# Patient Record
Sex: Male | Born: 1960
Health system: Southern US, Community
[De-identification: ages and names within clinical notes are randomized; demographics above are authoritative.]

## PROBLEM LIST (undated history)

## (undated) DIAGNOSIS — F32A Depression, unspecified: Secondary | ICD-10-CM

## (undated) DIAGNOSIS — G4730 Sleep apnea, unspecified: Secondary | ICD-10-CM

## (undated) DIAGNOSIS — I10 Essential (primary) hypertension: Secondary | ICD-10-CM

## (undated) DIAGNOSIS — T7840XA Allergy, unspecified, initial encounter: Secondary | ICD-10-CM

## (undated) DIAGNOSIS — J439 Emphysema, unspecified: Secondary | ICD-10-CM

## (undated) DIAGNOSIS — J449 Chronic obstructive pulmonary disease, unspecified: Secondary | ICD-10-CM

## (undated) DIAGNOSIS — N189 Chronic kidney disease, unspecified: Secondary | ICD-10-CM

## (undated) DIAGNOSIS — R0902 Hypoxemia: Secondary | ICD-10-CM

## (undated) DIAGNOSIS — I509 Heart failure, unspecified: Secondary | ICD-10-CM

## (undated) DIAGNOSIS — E785 Hyperlipidemia, unspecified: Secondary | ICD-10-CM

## (undated) DIAGNOSIS — E291 Testicular hypofunction: Secondary | ICD-10-CM

## (undated) DIAGNOSIS — L309 Dermatitis, unspecified: Secondary | ICD-10-CM

## (undated) DIAGNOSIS — Z114 Encounter for screening for human immunodeficiency virus [HIV]: Secondary | ICD-10-CM

## (undated) DIAGNOSIS — F329 Major depressive disorder, single episode, unspecified: Secondary | ICD-10-CM

## (undated) DIAGNOSIS — F1910 Other psychoactive substance abuse, uncomplicated: Secondary | ICD-10-CM

## (undated) DIAGNOSIS — N529 Male erectile dysfunction, unspecified: Secondary | ICD-10-CM

## (undated) HISTORY — DX: Allergy, unspecified, initial encounter: T78.40XA

## (undated) HISTORY — DX: Emphysema, unspecified: J43.9

## (undated) HISTORY — DX: Depression, unspecified: F32.A

## (undated) HISTORY — DX: Encounter for screening for human immunodeficiency virus (HIV): Z11.4

## (undated) HISTORY — DX: Chronic kidney disease, unspecified: N18.9

## (undated) HISTORY — DX: Hypoxemia: R09.02

## (undated) HISTORY — DX: Hyperlipidemia, unspecified: E78.5

## (undated) HISTORY — DX: Other psychoactive substance abuse, uncomplicated: F19.10

## (undated) HISTORY — DX: Major depressive disorder, single episode, unspecified: F32.9

## (undated) HISTORY — PX: NO PAST SURGERIES: SHX2092

---
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
## 2007-09-21 ENCOUNTER — Emergency Department (HOSPITAL_COMMUNITY): Admission: EM | Admit: 2007-09-21 | Discharge: 2007-09-21 | Payer: Self-pay | Admitting: Emergency Medicine

## 2009-10-22 ENCOUNTER — Emergency Department (HOSPITAL_BASED_OUTPATIENT_CLINIC_OR_DEPARTMENT_OTHER): Admission: EM | Admit: 2009-10-22 | Discharge: 2009-10-22 | Payer: Self-pay | Admitting: Emergency Medicine

## 2009-10-22 ENCOUNTER — Ambulatory Visit: Payer: Self-pay | Admitting: Diagnostic Radiology

## 2011-04-14 ENCOUNTER — Emergency Department (HOSPITAL_BASED_OUTPATIENT_CLINIC_OR_DEPARTMENT_OTHER)
Admission: EM | Admit: 2011-04-14 | Discharge: 2011-04-14 | Disposition: A | Discharge status: Home / Self Care | Payer: Self-pay | Attending: Emergency Medicine | Admitting: Emergency Medicine

## 2011-04-14 DIAGNOSIS — L259 Unspecified contact dermatitis, unspecified cause: Secondary | ICD-10-CM | POA: Insufficient documentation

## 2011-04-14 DIAGNOSIS — H5789 Other specified disorders of eye and adnexa: Secondary | ICD-10-CM | POA: Insufficient documentation

## 2011-04-14 DIAGNOSIS — H109 Unspecified conjunctivitis: Secondary | ICD-10-CM | POA: Insufficient documentation

## 2011-12-24 ENCOUNTER — Encounter (HOSPITAL_BASED_OUTPATIENT_CLINIC_OR_DEPARTMENT_OTHER): Payer: Self-pay | Admitting: *Deleted

## 2011-12-24 ENCOUNTER — Emergency Department (HOSPITAL_BASED_OUTPATIENT_CLINIC_OR_DEPARTMENT_OTHER)
Admission: EM | Admit: 2011-12-24 | Discharge: 2011-12-24 | Disposition: A | Discharge status: Home Health | Payer: Self-pay | Attending: Emergency Medicine | Admitting: Emergency Medicine

## 2011-12-24 DIAGNOSIS — L259 Unspecified contact dermatitis, unspecified cause: Secondary | ICD-10-CM | POA: Insufficient documentation

## 2011-12-24 DIAGNOSIS — L309 Dermatitis, unspecified: Secondary | ICD-10-CM

## 2011-12-24 DIAGNOSIS — F17200 Nicotine dependence, unspecified, uncomplicated: Secondary | ICD-10-CM | POA: Insufficient documentation

## 2011-12-24 HISTORY — DX: Dermatitis, unspecified: L30.9

## 2011-12-24 MED ORDER — HYDROCORTISONE 1 % EX CREA: TOPICAL_CREAM | CUTANEOUS | Status: DC

## 2011-12-24 NOTE — ED Provider Notes (Signed)
History     CSN: [id number]  Arrival date & time 12/24/11  0901   First MD Initiated Contact with Patient 12/24/11 0932      Chief Complaint  Patient presents with  . Eczema    generalized    (Consider location/radiation/quality/duration/timing/severity/associated sxs/prior treatment) The history is provided by the patient.   long-standing history of eczema.  He reports approximately 2 months of worsening eczema in his bilateral antecubital fossa as well as his right little finger.  He works at not a [employer] and he has not been compliant with wearing his personal protective equipment when using the materials at the facility.  He's been putting on 0.5% hydrocortisone cream without improvement.  His rash itches.  He denies fevers and chills.  He denies spreading redness.  His symptoms are mild to moderate in severity.  His symptoms are constant.  Nothing worsens or nothing improves his symptoms  Past Medical History  Diagnosis Date  . Eczema     History reviewed. No pertinent past surgical history.  No family history on file.  History  Substance Use Topics  . Smoking status: Current Everyday Smoker -- 1.0 packs/day for 30 years    Types: Cigarettes  . Smokeless tobacco: Not on file  . Alcohol Use: Yes     occassionally      Review of Systems  All other systems reviewed and are negative.    Allergies  Review of patient's allergies indicates no known allergies.  Home Medications   Current Outpatient Rx  Name Route Sig Dispense Refill  . ASPIRIN 325 MG PO TBEC Oral Take 325 mg by mouth daily.    [name] HYDROCORTISONE 0.5 % EX CREA Topical Apply topically 2 (two) times daily.    [name] HYDROCORTISONE 1 % EX CREA  Apply to affected areas 3 times a day 30 g 0    BP 130/94  Pulse 96  Temp(Src) 98.5 F (36.9 C) (Oral)  Resp 20  Ht 5\' 8"  (1.727 m)  Wt 200 lb (90.719 kg)  BMI 30.41 kg/m2  SpO2 97%  Physical Exam  Constitutional: He is oriented to person, place, and  time. He appears well-developed and well-nourished.  HENT:  Head: Normocephalic.  Eyes: EOM are normal.  Neck: Normal range of motion.  Pulmonary/Chest: Effort normal.  Musculoskeletal: Normal range of motion.  Neurological: He is alert and oriented to person, place, and time.  Skin:       Eczema lesions of bilateral antecubital fossa is as well as right little finger.  There is no surrounding erythema or fluctuance.  There is no secondary signs of infection  Psychiatric: He has a normal mood and affect.    ED Course  Procedures (including critical care time)  Labs Reviewed - No data to display No results found.   1. Eczema       MDM  Eczema without secondary signs of infection.  Home on steroid cream.  The patient is using 0.5% hydrocortisone cream.  I will increase this to 1% hydrocortisone cream        Lyanne Co, MD 12/24/11 [phone]

## 2011-12-24 NOTE — ED Notes (Signed)
Patient states he has chronic eczema and has had increased problems over the last two months.  Using OTC hydrocortizone cream with no relief.

## 2011-12-24 NOTE — Discharge Instructions (Signed)

## 2012-01-01 ENCOUNTER — Encounter (HOSPITAL_BASED_OUTPATIENT_CLINIC_OR_DEPARTMENT_OTHER): Payer: Self-pay

## 2012-01-01 ENCOUNTER — Emergency Department (HOSPITAL_BASED_OUTPATIENT_CLINIC_OR_DEPARTMENT_OTHER)
Admission: EM | Admit: 2012-01-01 | Discharge: 2012-01-01 | Disposition: A | Discharge status: Home / Self Care | Payer: Self-pay | Attending: Emergency Medicine | Admitting: Emergency Medicine

## 2012-01-01 ENCOUNTER — Emergency Department (INDEPENDENT_AMBULATORY_CARE_PROVIDER_SITE_OTHER): Payer: Self-pay

## 2012-01-01 DIAGNOSIS — F17200 Nicotine dependence, unspecified, uncomplicated: Secondary | ICD-10-CM | POA: Insufficient documentation

## 2012-01-01 DIAGNOSIS — R609 Edema, unspecified: Secondary | ICD-10-CM | POA: Insufficient documentation

## 2012-01-01 DIAGNOSIS — L089 Local infection of the skin and subcutaneous tissue, unspecified: Secondary | ICD-10-CM | POA: Insufficient documentation

## 2012-01-01 DIAGNOSIS — M25529 Pain in unspecified elbow: Secondary | ICD-10-CM

## 2012-01-01 DIAGNOSIS — IMO0002 Reserved for concepts with insufficient information to code with codable children: Secondary | ICD-10-CM

## 2012-01-01 MED ORDER — DOXYCYCLINE HYCLATE 100 MG PO CAPS: 100.0000 mg | ORAL_CAPSULE | Freq: Two times a day (BID) | ORAL | Status: AC

## 2012-01-01 MED ORDER — CEFTRIAXONE SODIUM 1 G IJ SOLR
1.0000 g | Freq: Once | INTRAMUSCULAR | Status: AC
  Administered 2012-01-01: 1 g via INTRAMUSCULAR
  Filled 2012-01-01: qty 10

## 2012-01-01 MED ORDER — HYDROCODONE-ACETAMINOPHEN 5-325 MG PO TABS: 2.0000 | ORAL_TABLET | ORAL | Status: AC | PRN

## 2012-01-01 MED ORDER — LIDOCAINE HCL (PF) 1 % IJ SOLN
INTRAMUSCULAR | Status: AC
  Administered 2012-01-01: 2.1 mL
  Filled 2012-01-01: qty 5

## 2012-01-01 MED ORDER — CEPHALEXIN 500 MG PO CAPS: 500.0000 mg | ORAL_CAPSULE | Freq: Four times a day (QID) | ORAL | Status: AC

## 2012-01-01 NOTE — ED Notes (Signed)
C/o "boil" to right elbow x 3 days

## 2012-01-01 NOTE — ED Provider Notes (Signed)
History     CSN: [id number]  Arrival date & time 01/01/12  1326   First MD Initiated Contact with Patient 01/01/12 1513      Chief Complaint  Patient presents with  . Abscess    (Consider location/radiation/quality/duration/timing/severity/associated sxs/prior treatment) Patient is a 51 y.o. male presenting with abscess. The history is provided by the patient. No language interpreter was used.  Abscess  This is a new problem. The current episode started just prior to arrival. The onset was gradual. The problem has been gradually worsening.  Pt reports he had a pimple on his elbow that he popped.  Pt thought he had an ingrown hair and was trying to remove it.  Pt complains of redness and swelling around area now.  The center has drained.  Past Medical History  Diagnosis Date  . Eczema     History reviewed. No pertinent past surgical history.  No family history on file.  History  Substance Use Topics  . Smoking status: Current Everyday Smoker -- 1.0 packs/day for 30 years    Types: Cigarettes  . Smokeless tobacco: Not on file  . Alcohol Use: Yes     occassionally      Review of Systems  Skin: Positive for wound.  All other systems reviewed and are negative.    Allergies  Review of patient's allergies indicates no known allergies.  Home Medications   Current Outpatient Rx  Name Route Sig Dispense Refill  . ASPIRIN 325 MG PO TBEC Oral Take 325 mg by mouth daily.    [name] HYDROCORTISONE 0.5 % EX CREA Topical Apply topically 2 (two) times daily.    [name] HYDROCORTISONE 1 % EX CREA  Apply to affected areas 3 times a day 30 g 0    BP 156/86  Pulse 89  Temp(Src) 98.7 F (37.1 C) (Oral)  Resp 20  Ht 5\' 7"  (1.702 m)  Wt 200 lb (90.719 kg)  BMI 31.32 kg/m2  SpO2 99%  Physical Exam  Nursing note and vitals reviewed. Constitutional: He is oriented to person, place, and time. He appears well-developed and well-nourished.  HENT:  Head: Normocephalic and atraumatic.    Musculoskeletal: He exhibits edema and tenderness.  Neurological: He is alert and oriented to person, place, and time.  Skin: Skin is dry. There is erythema.       Swollen red right elbow,  Warm to touch  Psychiatric: He has a normal mood and affect.    ED Course  Procedures (including critical care time)  Labs Reviewed - No data to display Dg Elbow Complete Right  01/01/2012  *RADIOLOGY REPORT*  Clinical Data: Pain, swelling and redness.  RIGHT ELBOW - COMPLETE 3+ VIEW  Comparison: None.  Findings: Soft tissue swelling is seen predominantly along the dorsal and medial aspect of the elbow.   No underlying acute osseous abnormality.  IMPRESSION: Cellulitis without acute osseous abnormality.  Original Report Authenticated By: Reyes Ivan, M.D.     No diagnosis found.    MDM  Pt given Rocephin IM.  Pt given rx for doxy and keflex and hydrocodone        Langston Masker, [place] 01/01/12 1600

## 2012-01-01 NOTE — Discharge Instructions (Signed)
Skin Infections A skin infection usually develops as a result of disruption of the skin barrier.  CAUSES  A skin infection might occur following:  Trauma or an injury to the skin such as a cut or insect sting.   Inflammation (as in eczema).   Breaks in the skin between the toes (as in athlete's foot).   Swelling (edema).  SYMPTOMS  The legs are the most common site affected. Usually there is:  Redness.   Swelling.   Pain.   There may be red streaks in the area of the infection.  TREATMENT   Minor skin infections may be treated with topical antibiotics, but if the skin infection is severe, hospital care and intravenous (IV) antibiotic treatment may be needed.   Most often skin infections can be treated with oral antibiotic medicine as well as proper rest and elevation of the affected area until the infection improves.   If you are prescribed oral antibiotics, it is important to take them as directed and to take all the pills even if you feel better before you have finished all of the medicine.   You may apply warm compresses to the area for 20-30 minutes 4 times daily.  You might need a tetanus shot now if:  You have no idea when you had the last one.   You have never had a tetanus shot before.   Your wound had dirt in it.  If you need a tetanus shot and you decide not to get one, there is a rare chance of getting tetanus. Sickness from tetanus can be serious. If you get a tetanus shot, your arm may swell and become red and warm at the shot site. This is common and not a problem. SEEK MEDICAL CARE IF:  The pain and swelling from your infection do not improve within 2 days.  SEEK IMMEDIATE MEDICAL CARE IF:  You develop a fever, chills, or other serious problems.  Document Released: 11/28/2004 Document Revised: 07/03/2011 Document Reviewed: 10/10/2008 Exit[employer] Patient Information 2012 [employer]. 

## 2012-01-02 ENCOUNTER — Emergency Department (HOSPITAL_BASED_OUTPATIENT_CLINIC_OR_DEPARTMENT_OTHER)
Admission: EM | Admit: 2012-01-02 | Discharge: 2012-01-02 | Disposition: A | Discharge status: Home Health | Payer: Self-pay | Attending: Emergency Medicine | Admitting: Emergency Medicine

## 2012-01-02 ENCOUNTER — Encounter (HOSPITAL_BASED_OUTPATIENT_CLINIC_OR_DEPARTMENT_OTHER): Payer: Self-pay

## 2012-01-02 DIAGNOSIS — L0291 Cutaneous abscess, unspecified: Secondary | ICD-10-CM

## 2012-01-02 DIAGNOSIS — IMO0002 Reserved for concepts with insufficient information to code with codable children: Secondary | ICD-10-CM | POA: Insufficient documentation

## 2012-01-02 DIAGNOSIS — F17200 Nicotine dependence, unspecified, uncomplicated: Secondary | ICD-10-CM | POA: Insufficient documentation

## 2012-01-02 DIAGNOSIS — R509 Fever, unspecified: Secondary | ICD-10-CM | POA: Insufficient documentation

## 2012-01-02 DIAGNOSIS — R21 Rash and other nonspecific skin eruption: Secondary | ICD-10-CM | POA: Insufficient documentation

## 2012-01-02 LAB — CBC
HCT: 42.1 % (ref 39.0–52.0)
MCH: 32 pg (ref 26.0–34.0)
MCHC: 34.9 g/dL (ref 30.0–36.0)
RDW: 13.5 % (ref 11.5–15.5)

## 2012-01-02 LAB — BASIC METABOLIC PANEL
BUN: 9 mg/dL (ref 6–23)
Calcium: 8.7 mg/dL (ref 8.4–10.5)
Chloride: 98 mEq/L (ref 96–112)
Creatinine, Ser: 0.9 mg/dL (ref 0.50–1.35)
GFR calc Af Amer: >90 mL/min (ref >90)
GFR calc non Af Amer: >90 mL/min (ref >90)

## 2012-01-02 LAB — DIFFERENTIAL
Basophils Absolute: 0 10*3/uL (ref 0.0–0.1)
Basophils Relative: 0 % (ref 0–1)
Eosinophils Absolute: 0 10*3/uL (ref 0.0–0.7)
Eosinophils Relative: 0 % (ref 0–5)
Monocytes Absolute: 2 10*3/uL — ABNORMAL HIGH (ref 0.1–1.0)
Neutro Abs: 15.2 10*3/uL — ABNORMAL HIGH (ref 1.7–7.7)

## 2012-01-02 MED ORDER — SODIUM CHLORIDE 0.9 % IV SOLN
Freq: Once | INTRAVENOUS | Status: AC
  Administered 2012-01-02: 16:00:00 via INTRAVENOUS

## 2012-01-02 MED ORDER — VANCOMYCIN HCL IN DEXTROSE 1-5 GM/200ML-% IV SOLN
1000.0000 mg | Freq: Once | INTRAVENOUS | Status: AC
  Administered 2012-01-02: 1000 mg via INTRAVENOUS
  Filled 2012-01-02: qty 200

## 2012-01-02 MED ORDER — DEXTROSE 5 % IV SOLN
1.0000 g | INTRAVENOUS | Status: DC
  Administered 2012-01-02: 1 g via INTRAVENOUS
  Filled 2012-01-02: qty 10

## 2012-01-02 NOTE — ED Notes (Signed)
Pt here for recheck of abcess to R elbow.  Pt currently taking ABX and Vicodin.  Pt just seen here yesterday and feels he should be getting better.

## 2012-01-02 NOTE — ED Provider Notes (Signed)
Medical screening examination/treatment/procedure(s) were performed by non-physician practitioner and as supervising physician I was immediately available for consultation/collaboration.   Tyriek Hofman, MD 01/02/12 1035 

## 2012-01-02 NOTE — ED Provider Notes (Signed)
Medical screening examination/treatment/procedure(s) were conducted as a shared visit with non-physician practitioner(s) and myself.  I personally evaluated the patient during the encounter   Nat Christen, MD 01/02/12 2352

## 2012-01-02 NOTE — Discharge Instructions (Signed)

## 2012-01-02 NOTE — ED Provider Notes (Signed)
Medical screening examination/treatment/procedure(s) were conducted as a shared visit with non-physician practitioner(s) and myself.  I personally evaluated the patient during the encounter  Patient seen yesterday by the same PA with noted cellulitis to his right elbow.  He comes back today per her instructions for reevaluation and has significant worsening in his erythema and swelling that extends up his right upper extremity and down his right forearm.  She was able to incise and drain the area over the elbow and obtained a significant amount of pus drainage.  Patient has received vancomycin and ceftriaxone here for antibiotics to cover for MRSA. We have encouraged the patient that he should allow [place] to transfer him for admission given his worsening vital signs and worsening of his cellulitis since yesterday but at this point in time the patient has declined because he has dogs at home that he has to get home and take care of.  We've encouraged him to find someone else to take care of them but he has been unable to do so.  We will continue the patient on his current antibiotics given that he had an abscess that was drained which would be the main source of infection and he should begin to improve now that that has been addressed.  We'll discharge the patient to return if he changes his mind so that he can receive further IV antibiotics and likely admission. We have also mentioned that he can come back to receive further antibiotics in 12 hours to receive another dose of vancomycin as well.  He understands this and the risks of leaving without admission for further IV antibiotics.Nat Christen, MD 01/02/12 [phone]

## 2012-01-02 NOTE — ED Provider Notes (Signed)
History     CSN: [id number]  Arrival date & time 01/02/12  1519   First MD Initiated Contact with Patient 01/02/12 1533      Chief Complaint  Patient presents with  . Wound Infection    (Consider location/radiation/quality/duration/timing/severity/associated sxs/prior treatment) Patient is a 51 y.o. male presenting with rash. The history is provided by the patient. No language interpreter was used.  Rash  This is a new problem. The current episode started more than 2 days ago. The problem has not changed since onset.The problem is associated with nothing. The maximum temperature recorded prior to his arrival was 100 to 100.9 F. The fever has been present for less than 1 day. The rash is present on the right arm.  Pt is here for scheduled recheck.  Pt reports area is swelling more.  Pt is taking antibiotics as directed  Past Medical History  Diagnosis Date  . Eczema     History reviewed. No pertinent past surgical history.  No family history on file.  History  Substance Use Topics  . Smoking status: Current Everyday Smoker -- 1.0 packs/day for 30 years    Types: Cigarettes  . Smokeless tobacco: Not on file  . Alcohol Use: Yes     occassionally      Review of Systems  Musculoskeletal: Positive for myalgias and joint swelling.  Skin: Positive for rash.  All other systems reviewed and are negative.    Allergies  Review of patient's allergies indicates no known allergies.  Home Medications   Current Outpatient Rx  Name Route Sig Dispense Refill  . ASPIRIN 325 MG PO TBEC Oral Take 325 mg by mouth daily.    . CEPHALEXIN 500 MG PO CAPS Oral Take 1 capsule (500 mg total) by mouth 4 (four) times daily. 40 capsule 0  . DOXYCYCLINE HYCLATE 100 MG PO CAPS Oral Take 1 capsule (100 mg total) by mouth 2 (two) times daily. 20 capsule 0  . HYDROCODONE-ACETAMINOPHEN 5-325 MG PO TABS Oral Take 2 tablets by mouth every 4 (four) hours as needed for pain. 16 tablet 0  .  HYDROCORTISONE 0.5 % EX CREA Topical Apply topically 2 (two) times daily.    [name] HYDROCORTISONE 1 % EX CREA Topical Apply 1 application topically 2 (two) times daily. Apply to affected areas 3 times a day      BP 140/82  Pulse 118  Temp(Src) 100.1 F (37.8 C) (Oral)  Resp 18  Ht 5\' 7"  (1.702 m)  Wt 200 lb (90.719 kg)  BMI 31.32 kg/m2  SpO2 96%  Physical Exam  Nursing note and vitals reviewed. Constitutional: He is oriented to person, place, and time. He appears well-developed and well-nourished.  HENT:  Head: Normocephalic.  Musculoskeletal: He exhibits edema and tenderness.       Increased area of redness and swelling,  Significant.  Pt had approx 20x30 cm area yesterday,  Today pt has swelling in fingers and redness up arm.    I am able to palpate and area that feels fluctuant  Neurological: He is alert and oriented to person, place, and time. He has normal reflexes.  Skin: There is erythema.  Psychiatric: He has a normal mood and affect.    ED Course  INCISION AND DRAINAGE Date/Time: 01/02/2012 9:21 PM Performed by: Langston Masker Authorized by: Langston Masker Consent: Verbal consent obtained. Risks and benefits: risks, benefits and alternatives were discussed Consent given by: patient Patient understanding: patient states understanding of the procedure being performed Patient  identity confirmed: verbally with patient Time out: Immediately prior to procedure a "time out" was called to verify the correct patient, procedure, equipment, support staff and site/side marked as required. Type: abscess Body area: upper extremity Location details: right elbow Anesthesia: local infiltration Patient sedated: no Scalpel size: 11 Incision type: single straight Complexity: simple Drainage: purulent Drainage amount: moderate Packing material: 1/4 in iodoform gauze Patient tolerance: Patient tolerated the procedure well with no immediate complications.   (including critical care  time)  Labs Reviewed  CBC - Abnormal; Notable for the following:    WBC 20.0 (*)    All other components within normal limits  DIFFERENTIAL - Abnormal; Notable for the following:    Neutro Abs 15.2 (*)    Monocytes Absolute 2.0 (*)    All other components within normal limits  BASIC METABOLIC PANEL - Abnormal; Notable for the following:    Sodium 134 (*)    Glucose, Bld 115 (*)    All other components within normal limits  WOUND CULTURE   Dg Elbow Complete Right  01/01/2012  *RADIOLOGY REPORT*  Clinical Data: Pain, swelling and redness.  RIGHT ELBOW - COMPLETE 3+ VIEW  Comparison: None.  Findings: Soft tissue swelling is seen predominantly along the dorsal and medial aspect of the elbow.   No underlying acute osseous abnormality.  IMPRESSION: Cellulitis without acute osseous abnormality.  Original Report Authenticated By: Reyes Ivan, M.D.     No diagnosis found.    MDM  Pt given IV Rocehin and vancomycin today.  I advised pt I think he needs admission,  Dr. Golda Acre examined and advised of the same.  Pt refuses admission.  (AMA)  Pt agreed to return for 12 hour recheck.  He understands risk of sepsis and death.  I was able to drain an area of abscess.          Langston Masker, [place] 01/02/12 2122

## 2012-01-03 ENCOUNTER — Encounter (HOSPITAL_BASED_OUTPATIENT_CLINIC_OR_DEPARTMENT_OTHER): Payer: Self-pay | Admitting: *Deleted

## 2012-01-03 ENCOUNTER — Emergency Department (HOSPITAL_BASED_OUTPATIENT_CLINIC_OR_DEPARTMENT_OTHER)
Admission: EM | Admit: 2012-01-03 | Discharge: 2012-01-03 | Disposition: A | Discharge status: Home / Self Care | Payer: Self-pay | Attending: Emergency Medicine | Admitting: Emergency Medicine

## 2012-01-03 DIAGNOSIS — F17200 Nicotine dependence, unspecified, uncomplicated: Secondary | ICD-10-CM | POA: Insufficient documentation

## 2012-01-03 DIAGNOSIS — L0390 Cellulitis, unspecified: Secondary | ICD-10-CM

## 2012-01-03 DIAGNOSIS — Z09 Encounter for follow-up examination after completed treatment for conditions other than malignant neoplasm: Secondary | ICD-10-CM | POA: Insufficient documentation

## 2012-01-03 MED ORDER — SODIUM CHLORIDE 0.9 % IV SOLN
Freq: Once | INTRAVENOUS | Status: AC
  Administered 2012-01-03: 14:00:00 via INTRAVENOUS

## 2012-01-03 MED ORDER — VANCOMYCIN HCL IN DEXTROSE 1-5 GM/200ML-% IV SOLN
1000.0000 mg | Freq: Once | INTRAVENOUS | Status: AC
  Administered 2012-01-03: 1000 mg via INTRAVENOUS
  Filled 2012-01-03: qty 200

## 2012-01-03 MED ORDER — DEXTROSE 5 % IV SOLN
1.0000 g | INTRAVENOUS | Status: DC
  Administered 2012-01-03: 1 g via INTRAVENOUS
  Filled 2012-01-03: qty 10

## 2012-01-03 NOTE — ED Notes (Signed)
Here for wound recheck right elbow abscess. Hand remains swollen. Dressing intact. Unable to visualize wound site at triage. Bloody drainage on bulky dressing.

## 2012-01-03 NOTE — ED Provider Notes (Signed)
Medical screening examination/treatment/procedure(s) were performed by non-physician practitioner and as supervising physician I was immediately available for consultation/collaboration.   Crystalmarie Yasin, MD 01/03/12 2340 

## 2012-01-03 NOTE — Discharge Instructions (Signed)
Cellulitis Cellulitis is an infection of the tissue under the skin. The infected area is usually red and tender. This is caused by germs. These germs enter the body through cuts or sores. This usually happens in the arms or lower legs. HOME CARE   Take your medicine as told. Finish it even if you start to feel better.   If the infection is on the arm or leg, keep it raised (elevated).   Use a warm cloth on the infected area several times a day.   See your doctor for a follow-up visit as told.  GET HELP RIGHT AWAY IF:   You are tired or confused.   You throw up (vomit).   You have watery poop (diarrhea).   You feel ill and have muscle aches.   You have a fever.  MAKE SURE YOU:   Understand these instructions.   Will watch your condition.   Will get help right away if you are not doing well or get worse.  Document Released: 04/08/2008 Document Revised: 07/03/2011 Document Reviewed: 09/22/2009 [employer] Patient Information 2012 [employer]. 

## 2012-01-03 NOTE — ED Provider Notes (Signed)
History     CSN: [id number]  Arrival date & time 01/03/12  1354   First MD Initiated Contact with Patient 01/03/12 1407      Chief Complaint  Patient presents with  . Wound Check    (Consider location/radiation/quality/duration/timing/severity/associated sxs/prior treatment) Patient is a 51 y.o. male presenting with wound check. The history is provided by the patient. No language interpreter was used.  Wound Check  He was treated in the ED 2 to 3 days ago. Previous treatment in the ED includes I&D of abscess. Treatments since wound repair include oral antibiotics and a wound recheck. The fever has been present for less than 1 day. The maximum temperature noted was 100.4 to 100.9 F. There has been colored discharge from the wound. There is no redness present. The swelling has worsened. He has no difficulty moving the affected extremity or digit.  Pt here for recheck.  I did Iand D yesterday.  Pt reports he is feeling better.  Past Medical History  Diagnosis Date  . Eczema     History reviewed. No pertinent past surgical history.  No family history on file.  History  Substance Use Topics  . Smoking status: Current Everyday Smoker -- 1.0 packs/day for 30 years    Types: Cigarettes  . Smokeless tobacco: Not on file  . Alcohol Use: Yes     occassionally      Review of Systems  Skin: Positive for wound.  All other systems reviewed and are negative.    Allergies  Review of patient's allergies indicates no known allergies.  Home Medications   Current Outpatient Rx  Name Route Sig Dispense Refill  . ASPIRIN 325 MG PO TBEC Oral Take 325 mg by mouth daily.    . CEPHALEXIN 500 MG PO CAPS Oral Take 1 capsule (500 mg total) by mouth 4 (four) times daily. 40 capsule 0  . DOXYCYCLINE HYCLATE 100 MG PO CAPS Oral Take 1 capsule (100 mg total) by mouth 2 (two) times daily. 20 capsule 0  . HYDROCODONE-ACETAMINOPHEN 5-325 MG PO TABS Oral Take 2 tablets by mouth every 4 (four) hours  as needed for pain. 16 tablet 0  . HYDROCORTISONE 0.5 % EX CREA Topical Apply topically 2 (two) times daily.    [name] HYDROCORTISONE 1 % EX CREA Topical Apply 1 application topically 2 (two) times daily. Apply to affected areas 3 times a day      BP 135/76  Pulse 102  Temp(Src) 98.7 F (37.1 C) (Oral)  Resp 20  SpO2 99%  Physical Exam  Vitals reviewed. Constitutional: He appears well-developed and well-nourished.  HENT:  Head: Normocephalic and atraumatic.  Right Ear: External ear normal.  Eyes: Conjunctivae and EOM are normal. Pupils are equal, round, and reactive to light.  Musculoskeletal: He exhibits tenderness.  Neurological: He is alert.  Skin: There is erythema.    ED Course  Procedures (including critical care time)  Labs Reviewed - No data to display No results found.   No diagnosis found.    MDM  Pt has decreased redness,  Decreased swelling,  Pt looks much better than yesterday.       Langston Masker, [place] 01/03/12 1747

## 2012-01-05 ENCOUNTER — Encounter (HOSPITAL_BASED_OUTPATIENT_CLINIC_OR_DEPARTMENT_OTHER): Payer: Self-pay | Admitting: Emergency Medicine

## 2012-01-05 ENCOUNTER — Emergency Department (HOSPITAL_BASED_OUTPATIENT_CLINIC_OR_DEPARTMENT_OTHER)
Admission: EM | Admit: 2012-01-05 | Discharge: 2012-01-05 | Disposition: A | Discharge status: Home / Self Care | Payer: Self-pay | Attending: Emergency Medicine | Admitting: Emergency Medicine

## 2012-01-05 DIAGNOSIS — IMO0002 Reserved for concepts with insufficient information to code with codable children: Secondary | ICD-10-CM | POA: Insufficient documentation

## 2012-01-05 DIAGNOSIS — Z09 Encounter for follow-up examination after completed treatment for conditions other than malignant neoplasm: Secondary | ICD-10-CM | POA: Insufficient documentation

## 2012-01-05 DIAGNOSIS — Z7982 Long term (current) use of aspirin: Secondary | ICD-10-CM | POA: Insufficient documentation

## 2012-01-05 DIAGNOSIS — L02413 Cutaneous abscess of right upper limb: Secondary | ICD-10-CM

## 2012-01-05 DIAGNOSIS — Z79899 Other long term (current) drug therapy: Secondary | ICD-10-CM | POA: Insufficient documentation

## 2012-01-05 LAB — WOUND CULTURE

## 2012-01-05 NOTE — ED Notes (Signed)
Pt hear for re-check of I & D to RT elbow

## 2012-01-05 NOTE — ED Provider Notes (Signed)
History     CSN: [id number]  Arrival date & time 01/05/12  1046   First MD Initiated Contact with Patient 01/05/12 1051      Chief Complaint  Patient presents with  . Wound Check    (Consider location/radiation/quality/duration/timing/severity/associated sxs/prior treatment) Patient is a 51 y.o. male presenting with wound check. The history is provided by the patient.  Wound Check    patient is in for recheck of his right elbow abscess. He was called and informed of the culture showed MRSA.. It is sensitive to doxycycline. He had indeed a few days ago. He initially refused admission. He states it is continued to get better. There is decreased pain. The swelling is improved on his forearm and upper arm. No fevers  Past Medical History  Diagnosis Date  . Eczema     History reviewed. No pertinent past surgical history.  No family history on file.  History  Substance Use Topics  . Smoking status: Current Everyday Smoker -- 1.0 packs/day for 30 years    Types: Cigarettes  . Smokeless tobacco: Not on file  . Alcohol Use: Yes     occassionally      Review of Systems  Constitutional: Negative for fever.  Musculoskeletal: Negative for myalgias.  Skin: Positive for color change and wound. Negative for rash.  Neurological: Negative for weakness and numbness.    Allergies  Review of patient's allergies indicates no known allergies.  Home Medications   Current Outpatient Rx  Name Route Sig Dispense Refill  . ASPIRIN 325 MG PO TBEC Oral Take 325 mg by mouth daily.    . CEPHALEXIN 500 MG PO CAPS Oral Take 1 capsule (500 mg total) by mouth 4 (four) times daily. 40 capsule 0  . DOXYCYCLINE HYCLATE 100 MG PO CAPS Oral Take 1 capsule (100 mg total) by mouth 2 (two) times daily. 20 capsule 0  . HYDROCODONE-ACETAMINOPHEN 5-325 MG PO TABS Oral Take 2 tablets by mouth every 4 (four) hours as needed for pain. 16 tablet 0  . HYDROCORTISONE 0.5 % EX CREA Topical Apply topically 2 (two)  times daily.    [name] HYDROCORTISONE 1 % EX CREA Topical Apply 1 application topically 2 (two) times daily. Apply to affected areas 3 times a day      Pulse 81  Temp(Src) 98.2 F (36.8 C) (Oral)  Resp 16  SpO2 98%  Physical Exam  Nursing note and vitals reviewed. Constitutional: He appears well-developed and well-nourished.  Musculoskeletal: Normal range of motion.       Healing abscess to right elbow. Packing pulled out slightly. Still some mild peripheral and drainage. Range of motion is intact. There is edema and erythema on the forearm. There is edema of his hand. Strong distal pulse intact  Neurological: He is alert.    ED Course  Procedures (including critical care time)  Labs Reviewed - No data to display No results found.   1. Abscess of right forearm       MDM  Healing abscess of right upper extremity. Patient states it is improving. Less pain. Less edema. He'll continue the antibiotics. He'll followup in a couple days if needed. He'll have the packing removed in 2 more days        Juliet Rude. Rubin Payor, MD 01/05/12 1113

## 2012-01-05 NOTE — ED Notes (Signed)
Lab called positive abscess culture, + MRSA.  Treated with RX Doxycycline 2/27, sensitive to same per protocol MD. Will contact patient with positive result.

## 2012-02-16 ENCOUNTER — Emergency Department (HOSPITAL_BASED_OUTPATIENT_CLINIC_OR_DEPARTMENT_OTHER)
Admission: EM | Admit: 2012-02-16 | Discharge: 2012-02-16 | Disposition: A | Discharge status: Home / Self Care | Payer: Self-pay | Attending: Emergency Medicine | Admitting: Emergency Medicine

## 2012-02-16 ENCOUNTER — Encounter (HOSPITAL_BASED_OUTPATIENT_CLINIC_OR_DEPARTMENT_OTHER): Payer: Self-pay | Admitting: Emergency Medicine

## 2012-02-16 DIAGNOSIS — L309 Dermatitis, unspecified: Secondary | ICD-10-CM

## 2012-02-16 DIAGNOSIS — L259 Unspecified contact dermatitis, unspecified cause: Secondary | ICD-10-CM | POA: Insufficient documentation

## 2012-02-16 DIAGNOSIS — F17200 Nicotine dependence, unspecified, uncomplicated: Secondary | ICD-10-CM | POA: Insufficient documentation

## 2012-02-16 DIAGNOSIS — B9562 Methicillin resistant Staphylococcus aureus infection as the cause of diseases classified elsewhere: Secondary | ICD-10-CM

## 2012-02-16 MED ORDER — SULFAMETHOXAZOLE-TRIMETHOPRIM 800-160 MG PO TABS: 1.0000 | ORAL_TABLET | Freq: Two times a day (BID) | ORAL | Status: AC

## 2012-02-16 NOTE — ED Notes (Signed)
Pt having problems with his eczema.  Dryness to hands.  Pt having irritation to right eye, states he was told that he has eczema in his eye in the past.

## 2012-02-16 NOTE — Discharge Instructions (Signed)
Cellulitis Cellulitis is an infection of the skin and the tissue beneath it. The area is typically red and tender. It is caused by germs (bacteria) (usually staph or strep) that enter the body through cuts or sores. Cellulitis most commonly occurs in the arms or lower legs.  HOME CARE INSTRUCTIONS   If you are given a prescription for medications which kill germs (antibiotics), take as directed until finished.   If the infection is on the arm or leg, keep the limb elevated as able.   Use a warm cloth several times per day to relieve pain and encourage healing.   See your caregiver for recheck of the infected site as directed if problems arise.   Only take over-the-counter or prescription medicines for pain, discomfort, or fever as directed by your caregiver.  SEEK MEDICAL CARE IF:   The area of redness (inflammation) is spreading, there are red streaks coming from the infected site, or if a part of the infection begins to turn dark in color.   The joint or bone underneath the infected skin becomes painful after the skin has healed.   The infection returns in the same or another area after it seems to have gone away.   A boil or bump swells up. This may be an abscess.   New, unexplained problems such as pain or fever develop.  SEEK IMMEDIATE MEDICAL CARE IF:   You have a fever.   You or your child feels drowsy or lethargic.   There is vomiting, diarrhea, or lasting discomfort or feeling ill (malaise) with muscle aches and pains.  MAKE SURE YOU:   Understand these instructions.   Will watch your condition.   Will get help right away if you are not doing well or get worse.  Document Released: 07/31/2005 Document Revised: 10/10/2011 Document Reviewed: 06/08/2008 [employer] PatiEczema Atopic dermatitis, or eczema, is an inherited type of sensitive skin. Often people with eczema have a family history of allergies, asthma, or hay fever. It causes a red itchy rash and dry scaly skin.  The itchiness may occur before the skin rash and may be very intense. It is not contagious. Eczema is generally worse during the cooler winter months and often improves with the warmth of summer. Eczema usually starts showing signs in infancy. Some children outgrow eczema, but it may last through adulthood. Flare-ups may be caused by:  Eating something or contact with something you are sensitive or allergic to.   Stress.  DIAGNOSIS  The diagnosis of eczema is usually based upon symptoms and medical history. TREATMENT  Eczema cannot be cured, but symptoms usually can be controlled with treatment or avoidance of allergens (things to which you are sensitive or allergic to).  Controlling the itching and scratching.   Use over-the-counter antihistamines as directed for itching. It is especially useful at night when the itching tends to be worse.   Use over-the-counter steroid creams as directed for itching.   Scratching makes the rash and itching worse and may cause impetigo (a skin infection) if fingernails are contaminated (dirty).   Keeping the skin well moisturized with creams every day. This will seal in moisture and help prevent dryness. Lotions containing alcohol and water can dry the skin and are not recommended.   Limiting exposure to allergens.   Recognizing situations that cause stress.   Developing a plan to manage stress.  HOME CARE INSTRUCTIONS   Take prescription and over-the-counter medicines as directed by your caregiver.   Do not use  anything on the skin without checking with your caregiver.   Keep baths or showers short (5 minutes) in warm (not hot) water. Use mild cleansers for bathing. You may add non-perfumed bath oil to the bath water. It is best to avoid soap and bubble bath.   Immediately after a bath or shower, when the skin is still damp, apply a moisturizing ointment to the entire body. This ointment should be a petroleum ointment. This will seal in moisture  and help prevent dryness. The thicker the ointment the better. These should be unscented.   Keep fingernails cut short and wash hands often. If your child has eczema, it may be necessary to put soft gloves or mittens on your child at night.   Dress in clothes made of cotton or cotton blends. Dress lightly, as heat increases itching.   Avoid foods that may cause flare-ups. Common foods include cow's milk, peanut butter, eggs and wheat.   Keep a child with eczema away from anyone with fever blisters. The virus that causes fever blisters (herpes simplex) can cause a serious skin infection in children with eczema.  SEEK MEDICAL CARE IF:   Itching interferes with sleep.   The rash gets worse or is not better within one week following treatment.   The rash looks infected (pus or soft yellow scabs).   You or your child has an oral temperature above 102 F (38.9 C).   Your baby is older than 3 months with a rectal temperature of 100.5 F (38.1 C) or higher for more than 1 day.   The rash flares up after contact with someone who has fever blisters.  SEEK IMMEDIATE MEDICAL CARE IF:   Your baby is older than 3 months with a rectal temperature of 102 F (38.9 C) or higher.   Your baby is older than 3 months or younger with a rectal temperature of 100.4 F (38 C) or higher.  Document Released: 10/18/2000 Document Revised: 10/10/2011 Document Reviewed: 08/23/2009 [facility] Patient Information 2012 [employer].ent Information 2012 [place], [place].

## 2012-02-16 NOTE — ED Provider Notes (Signed)
History     CSN: [id number]  Arrival date & time 02/16/12  1137   First MD Initiated Contact with Patient 02/16/12 1217      Chief Complaint  Patient presents with  . Eczema  . Eye Problem    (Consider location/radiation/quality/duration/timing/severity/associated sxs/prior treatment) HPI Patient with history of eczema. He states he has been using Aquaphor with gloves with some relief. He uses impermeable gloves which causes hands to swell and makes the symptoms worse. He continues to have some rash on his hands on the insides of both of his elbows. He states he has also had an area under his right elbow that was MRSA. This began as a reddened area and he received antibiotics but had to come back and have it I&D. He states it come back for 3 days in a row for IV antibody but got better from this. Now with approximately a month ago. He now notes a Maul area in the left lower abdomen that is erythematous and about 2 x 2 centimeters. He states that this looks like how the elbow first began. He is concerned that it is MRSA. He has not had any fever chills or streaking. It has not had any pus or fluctuance to the area. He has not been will see her primary care doctor due to financial reasons. Past Medical History  Diagnosis Date  . Eczema     History reviewed. No pertinent past surgical history.  History reviewed. No pertinent family history.  History  Substance Use Topics  . Smoking status: Current Everyday Smoker -- 1.0 packs/day for 30 years    Types: Cigarettes  . Smokeless tobacco: Not on file  . Alcohol Use: Yes     occassionally      Review of Systems  All other systems reviewed and are negative.    Allergies  Review of patient's allergies indicates no known allergies.  Home Medications   Current Outpatient Rx  Name Route Sig Dispense Refill  . ASPIRIN 325 MG PO TBEC Oral Take 325 mg by mouth daily.    [name] HYDROCORTISONE 0.5 % EX CREA Topical Apply topically 2  (two) times daily.    [name] HYDROCORTISONE 1 % EX CREA Topical Apply 1 application topically 2 (two) times daily. Apply to affected areas 3 times a day      BP 142/93  Pulse 88  Temp(Src) 98.2 F (36.8 C) (Oral)  Resp 16  SpO2 97%  Physical Exam  Nursing note and vitals reviewed. Constitutional: He is oriented to person, place, and time. He appears well-developed and well-nourished.  HENT:  Head: Normocephalic and atraumatic.  Right Ear: External ear normal.  Left Ear: External ear normal.  Nose: Nose normal.  Mouth/Throat: Oropharynx is clear and moist.  Eyes: Conjunctivae and EOM are normal. Pupils are equal, round, and reactive to light.  Neck: Normal range of motion. Neck supple.  Cardiovascular: Normal rate, regular rhythm, normal heart sounds and intact distal pulses.   Pulmonary/Chest: Effort normal and breath sounds normal.  Abdominal: Soft. Bowel sounds are normal.  Musculoskeletal: Normal range of motion.  Neurological: He is alert and oriented to person, place, and time. He has normal reflexes.  Skin: Skin is warm and dry.       Rash consistent with eczema anterior folds of elbows bilateral hands. Erythematous lesion left lower abdomen 2 x 2 centimeters without fluctuance or induration.  Psychiatric: He has a normal mood and affect. His behavior is normal. Thought content normal.  ED Course  Procedures (including critical care time)  Labs Reviewed - No data to display No results found.   No diagnosis found.    MDM          Hilario Quarry, MD 02/16/12 1524

## 2012-08-10 ENCOUNTER — Emergency Department (HOSPITAL_BASED_OUTPATIENT_CLINIC_OR_DEPARTMENT_OTHER)
Admission: EM | Admit: 2012-08-10 | Discharge: 2012-08-10 | Disposition: A | Discharge status: Home / Self Care | Payer: Self-pay | Attending: Emergency Medicine | Admitting: Emergency Medicine

## 2012-08-10 ENCOUNTER — Encounter (HOSPITAL_BASED_OUTPATIENT_CLINIC_OR_DEPARTMENT_OTHER): Payer: Self-pay | Admitting: Family Medicine

## 2012-08-10 DIAGNOSIS — T63301A Toxic effect of unspecified spider venom, accidental (unintentional), initial encounter: Secondary | ICD-10-CM

## 2012-08-10 DIAGNOSIS — F17200 Nicotine dependence, unspecified, uncomplicated: Secondary | ICD-10-CM | POA: Insufficient documentation

## 2012-08-10 DIAGNOSIS — T6391XA Toxic effect of contact with unspecified venomous animal, accidental (unintentional), initial encounter: Secondary | ICD-10-CM | POA: Insufficient documentation

## 2012-08-10 DIAGNOSIS — T63391A Toxic effect of venom of other spider, accidental (unintentional), initial encounter: Secondary | ICD-10-CM | POA: Insufficient documentation

## 2012-08-10 DIAGNOSIS — Y92009 Unspecified place in unspecified non-institutional (private) residence as the place of occurrence of the external cause: Secondary | ICD-10-CM | POA: Insufficient documentation

## 2012-08-10 MED ORDER — HYDROCORTISONE 1 % EX CREA: TOPICAL_CREAM | CUTANEOUS | Status: DC

## 2012-08-10 NOTE — ED Notes (Signed)
Pt c/o unknown insect bite to Left upper arm x 2 days. Pt c/o itching.

## 2012-08-10 NOTE — ED Notes (Signed)
Pt reports being bitten by a spider this past Saturday. Pt reports putting etoh pad on site to reduce itching. Pt has a reddened ara approximately the size of a dime on left upper arm. Pt denies nausea, vomiting, diarrhea or pain with the bite.

## 2012-08-10 NOTE — ED Provider Notes (Addendum)
History     CSN: [id number]  Arrival date & time 08/10/12  1126   First MD Initiated Contact with Patient 08/10/12 1148      Chief Complaint  Patient presents with  . Insect Bite    (Consider location/radiation/quality/duration/timing/severity/associated sxs/prior treatment) HPI Comments: Bit by a spider 2 days ago with persistent lesion on the left upper arm that itches. 0 pain. Mild redness around the area but feels that it's improving  Patient is a 51 y.o. male presenting with animal bite. The history is provided by the patient.  Animal Bite  The incident occurred more than 2 days ago. The incident occurred at home. There is an injury to the left upper arm. The patient is experiencing no pain. There have been no prior injuries to these areas. He has received no recent medical care.    Past Medical History  Diagnosis Date  . Eczema     History reviewed. No pertinent past surgical history.  No family history on file.  History  Substance Use Topics  . Smoking status: Current Every Day Smoker -- 1.0 packs/day for 30 years    Types: Cigarettes  . Smokeless tobacco: Not on file  . Alcohol Use: Yes     occassionally      Review of Systems  All other systems reviewed and are negative.    Allergies  Review of patient's allergies indicates no known allergies.  Home Medications   Current Outpatient Rx  Name Route Sig Dispense Refill  . ASPIRIN 325 MG PO TBEC Oral Take 325 mg by mouth daily.    [name] HYDROCORTISONE 0.5 % EX CREA Topical Apply topically 2 (two) times daily.    [name] HYDROCORTISONE 1 % EX CREA Topical Apply 1 application topically 2 (two) times daily. Apply to affected areas 3 times a day      BP 162/96  Pulse 80  Temp 98.8 F (37.1 C) (Oral)  Resp 16  Ht 5\' 7"  (1.702 m)  Wt 200 lb (90.719 kg)  BMI 31.32 kg/m2  SpO2 100%  Physical Exam  Nursing note and vitals reviewed. Constitutional: He is oriented to person, place, and time. He appears  well-developed and well-nourished. No distress.  HENT:  Head: Normocephalic and atraumatic.  Eyes: EOM are normal. Pupils are equal, round, and reactive to light.  Pulmonary/Chest: Effort normal.  Neurological: He is alert and oriented to person, place, and time.  Skin: Skin is warm and dry.       ED Course  Procedures (including critical care time)  Labs Reviewed - No data to display No results found.   1. Spider bite       MDM   Patient is present with an insect bite which most likely was a spider from several days ago. No sign of secondary infection and otherwise patient is well-appearing. Recommended using hydrocortisone cream and Benadryl when necessary for itching.        Gwyneth Sprout, MD 08/10/12 4098  Gwyneth Sprout, MD 08/10/12 1210

## 2013-07-29 ENCOUNTER — Encounter (HOSPITAL_COMMUNITY): Payer: Self-pay | Admitting: Emergency Medicine

## 2013-07-29 ENCOUNTER — Emergency Department (HOSPITAL_COMMUNITY): Payer: Self-pay

## 2013-07-29 ENCOUNTER — Emergency Department (HOSPITAL_COMMUNITY)
Admission: EM | Admit: 2013-07-29 | Discharge: 2013-07-29 | Disposition: A | Discharge status: Home / Self Care | Payer: Self-pay | Attending: Emergency Medicine | Admitting: Emergency Medicine

## 2013-07-29 DIAGNOSIS — Z7982 Long term (current) use of aspirin: Secondary | ICD-10-CM | POA: Insufficient documentation

## 2013-07-29 DIAGNOSIS — R071 Chest pain on breathing: Secondary | ICD-10-CM | POA: Insufficient documentation

## 2013-07-29 DIAGNOSIS — F17200 Nicotine dependence, unspecified, uncomplicated: Secondary | ICD-10-CM | POA: Insufficient documentation

## 2013-07-29 DIAGNOSIS — J069 Acute upper respiratory infection, unspecified: Secondary | ICD-10-CM | POA: Insufficient documentation

## 2013-07-29 DIAGNOSIS — Z872 Personal history of diseases of the skin and subcutaneous tissue: Secondary | ICD-10-CM | POA: Insufficient documentation

## 2013-07-29 DIAGNOSIS — R0789 Other chest pain: Secondary | ICD-10-CM

## 2013-07-29 DIAGNOSIS — I10 Essential (primary) hypertension: Secondary | ICD-10-CM | POA: Insufficient documentation

## 2013-07-29 MED ORDER — ALBUTEROL SULFATE HFA 108 (90 BASE) MCG/ACT IN AERS
2.0000 | INHALATION_SPRAY | RESPIRATORY_TRACT | Status: DC | PRN
  Administered 2013-07-29: 2 via RESPIRATORY_TRACT
  Filled 2013-07-29: qty 6.7

## 2013-07-29 MED ORDER — OXYCODONE-ACETAMINOPHEN 5-325 MG PO TABS
1.0000 | ORAL_TABLET | Freq: Once | ORAL | Status: AC
  Administered 2013-07-29: 1 via ORAL
  Filled 2013-07-29: qty 1

## 2013-07-29 MED ORDER — OXYCODONE-ACETAMINOPHEN 5-325 MG PO TABS: 1.0000 | ORAL_TABLET | Freq: Four times a day (QID) | ORAL | Status: DC | PRN

## 2013-07-29 NOTE — ED Provider Notes (Signed)
CSN: [id number]     Arrival date & time 07/29/13  8657 History   First MD Initiated Contact with Patient 07/29/13 [phone]     Chief Complaint  Patient presents with  . Chest Pain  . Cough   (Consider location/radiation/quality/duration/timing/severity/associated sxs/prior Treatment) Patient is a 52 y.o. male presenting with chest pain and cough. The history is provided by the patient.  Chest Pain Associated symptoms: cough   Associated symptoms: no abdominal pain, no back pain, no headache, no nausea, no numbness, no shortness of breath, not vomiting and no weakness   Cough Associated symptoms: chest pain   Associated symptoms: no headaches, no rash and no shortness of breath    patient's had a cough for the last few days. He states that they ago he had a cough and right after that he developed pain in his left lower chest. It is worse with movement or coughing. No fevers. States he has been breathing of some sputum when he is unable to cough up. No lightheadedness or dizziness. He is a smoker. The pain is sharp.  Past Medical History  Diagnosis Date  . Eczema    History reviewed. No pertinent past surgical history. History reviewed. No pertinent family history. History  Substance Use Topics  . Smoking status: Current Every Day Smoker -- 1.00 packs/day for 30 years    Types: Cigarettes  . Smokeless tobacco: Not on file  . Alcohol Use: Yes     Comment: occassionally    Review of Systems  Constitutional: Negative for activity change and appetite change.  HENT: Negative for neck stiffness.   Eyes: Negative for pain.  Respiratory: Positive for cough. Negative for chest tightness and shortness of breath.   Cardiovascular: Positive for chest pain. Negative for leg swelling.  Gastrointestinal: Negative for nausea, vomiting, abdominal pain and diarrhea.  Genitourinary: Negative for flank pain.  Musculoskeletal: Negative for back pain.  Skin: Negative for rash.  Neurological: Negative  for weakness, numbness and headaches.  Psychiatric/Behavioral: Negative for behavioral problems.    Allergies  Review of patient's allergies indicates no known allergies.  Home Medications   Current Outpatient Rx  Name  Route  Sig  Dispense  Refill  . aspirin 325 MG EC tablet   Oral   Take 325 mg by mouth daily.         . Multiple Vitamin (THERA PO)   Oral   Take 1 packet by mouth at bedtime as needed (for cold).         [name] oxyCODONE-acetaminophen (PERCOCET/ROXICET) 5-325 MG per tablet   Oral   Take 1-2 tablets by mouth every 6 (six) hours as needed for pain.   15 tablet   0    BP 167/114  Pulse 87  Temp(Src) 98.1 F (36.7 C) (Oral)  Resp 18  SpO2 95% Physical Exam  Nursing note and vitals reviewed. Constitutional: He is oriented to person, place, and time. He appears well-developed and well-nourished.  HENT:  Head: Normocephalic and atraumatic.  Eyes: EOM are normal. Pupils are equal, round, and reactive to light.  Neck: Normal range of motion. Neck supple.  Cardiovascular: Normal rate, regular rhythm and normal heart sounds.   No murmur heard. Pulmonary/Chest: Effort normal. He exhibits tenderness.  Mild diffuse harsh wheezes bilaterally. Tenderness to left anterior lower chest wall. No crepitus or deformity.  Abdominal: Soft. Bowel sounds are normal. He exhibits no distension and no mass. There is no tenderness. There is no rebound and no guarding.  Musculoskeletal: Normal range of motion. He exhibits no edema.  Neurological: He is alert and oriented to person, place, and time. No cranial nerve deficit.  Skin: Skin is warm and dry.  Psychiatric: He has a normal mood and affect.    ED Course  Procedures (including critical care time) Labs Review Labs Reviewed - No data to display Imaging Review Dg Ribs Unilateral W/chest Left  07/29/2013   CLINICAL DATA:  Left lower rib pain and and shortness of Breath.  EXAM: LEFT RIBS AND CHEST - 3+ VIEW  COMPARISON:   None.  FINDINGS: The cardiac silhouette, mediastinal and hilar contours are within normal limits. The lungs are clear. No pleural effusion, pleural thickening or pneumothorax.  Dedicated views of the left ribs demonstrate no definite fracture or bone lesion.  IMPRESSION: No acute cardiopulmonary findings.  No left-sided rib fracture or bone lesion.   Electronically Signed   By: Loralie Champagne M.D.   On: 07/29/2013 10:36    Date: 07/29/2013  Rate: 85  Rhythm: normal sinus rhythm  QRS Axis: normal  Intervals: normal  ST/T Wave abnormalities: normal  Conduction Disutrbances:none  Narrative Interpretation:   Old EKG Reviewed: none available   MDM   1. URI (upper respiratory infection)   2. Chest wall pain    Patient with cough this had some production. Diffuse wheezes. Tenderness in left lower chest wall began acutely after the coughing. Likely musculoskeletal. EKG reassuring. X-ray does not show fracture. We'll treat with pain medicines and inhaler. Doubt cardiac or pulmonary embolism as a cause.  Patient does have hypertension. Could be due to the pain, however will need to be followed.    Juliet Rude. Rubin Payor, MD 07/29/13 1201

## 2013-07-29 NOTE — ED Notes (Signed)
Patient was educated not to drive, operate heavy machinery, or drink alcohol while taking narcotic medication.  

## 2013-07-29 NOTE — Progress Notes (Signed)
[facility] provided pt with a list of primary care resources and a [facility] [employer] [facility] application.  °

## 2013-07-29 NOTE — ED Notes (Signed)
Bed: ZO10 Expected date:  Expected time:  Means of arrival:  Comments: ems- right rib cage pain due to cough

## 2013-07-29 NOTE — ED Notes (Signed)
Patient transported to X-ray 

## 2013-07-29 NOTE — ED Notes (Signed)
Per EMS patient reports to ED for sever coughing beginning two days ago, two days ago patient had a particularly strong cough which led to sudden onset left lower chest pain. EMS reports that patient has thick sputum in his throat.

## 2013-09-12 ENCOUNTER — Emergency Department (HOSPITAL_COMMUNITY)
Admission: EM | Admit: 2013-09-12 | Discharge: 2013-09-12 | Disposition: A | Discharge status: Home / Self Care | Payer: Self-pay | Attending: Emergency Medicine | Admitting: Emergency Medicine

## 2013-09-12 ENCOUNTER — Emergency Department (HOSPITAL_COMMUNITY): Payer: Self-pay

## 2013-09-12 ENCOUNTER — Encounter (HOSPITAL_COMMUNITY): Payer: Self-pay | Admitting: Emergency Medicine

## 2013-09-12 DIAGNOSIS — F17200 Nicotine dependence, unspecified, uncomplicated: Secondary | ICD-10-CM | POA: Insufficient documentation

## 2013-09-12 DIAGNOSIS — R05 Cough: Secondary | ICD-10-CM

## 2013-09-12 DIAGNOSIS — Z872 Personal history of diseases of the skin and subcutaneous tissue: Secondary | ICD-10-CM | POA: Insufficient documentation

## 2013-09-12 DIAGNOSIS — Z79899 Other long term (current) drug therapy: Secondary | ICD-10-CM | POA: Insufficient documentation

## 2013-09-12 DIAGNOSIS — J209 Acute bronchitis, unspecified: Secondary | ICD-10-CM | POA: Insufficient documentation

## 2013-09-12 DIAGNOSIS — Z7982 Long term (current) use of aspirin: Secondary | ICD-10-CM | POA: Insufficient documentation

## 2013-09-12 DIAGNOSIS — J40 Bronchitis, not specified as acute or chronic: Secondary | ICD-10-CM

## 2013-09-12 DIAGNOSIS — R059 Cough, unspecified: Secondary | ICD-10-CM

## 2013-09-12 MED ORDER — IBUPROFEN 200 MG PO TABS
600.0000 mg | ORAL_TABLET | Freq: Once | ORAL | Status: AC
  Administered 2013-09-12: 600 mg via ORAL
  Filled 2013-09-12: qty 3

## 2013-09-12 MED ORDER — OXYCODONE-ACETAMINOPHEN 5-325 MG PO TABS
1.0000 | ORAL_TABLET | Freq: Once | ORAL | Status: AC
  Administered 2013-09-12: 1 via ORAL
  Filled 2013-09-12: qty 1

## 2013-09-12 MED ORDER — AZITHROMYCIN 250 MG PO TABS: 250.0000 mg | ORAL_TABLET | Freq: Every day | ORAL | Status: DC

## 2013-09-12 MED ORDER — HYDROCODONE-ACETAMINOPHEN 5-325 MG PO TABS: 1.0000 | ORAL_TABLET | ORAL | Status: DC | PRN

## 2013-09-12 NOTE — ED Provider Notes (Signed)
CSN: [id number]     Arrival date & time 09/12/13  1027 History   First MD Initiated Contact with Patient 09/12/13 1104     Chief Complaint  Patient presents with  . Cough   (Consider location/radiation/quality/duration/timing/severity/associated sxs/prior Treatment) The history is provided by the patient.  patient reports developing productive coughing congestion over the past several days.  Chills without documented fever.  He states discomfort in the left lateral chest only when coughing.  He tried ibuprofen without improvement in his symptoms.  He's tried Mucinex with some improvement in his productive cough.  No history of asthma, COPD, emphysema.  He continues to smoke cigarettes and has a 30-pack-year history.  Patient denies weight loss.  No unilateral leg swelling.  No history of DVT or pulmonary embolism.  No abdominal pain.  No back pain.  No shortness of breath or exertional shortness of breath.  His symptoms are mild to moderate in severity.  Past Medical History  Diagnosis Date  . Eczema    History reviewed. No pertinent past surgical history. No family history on file. History  Substance Use Topics  . Smoking status: Current Every Day Smoker -- 1.00 packs/day for 30 years    Types: Cigarettes  . Smokeless tobacco: Not on file  . Alcohol Use: Yes     Comment: occassionally    Review of Systems  Respiratory: Positive for cough.   All other systems reviewed and are negative.    Allergies  Review of patient's allergies indicates no known allergies.  Home Medications   Current Outpatient Rx  Name  Route  Sig  Dispense  Refill  . aspirin 325 MG EC tablet   Oral   Take 325 mg by mouth daily.         [name] guaiFENesin (MUCINEX) 600 MG 12 hr tablet   Oral   Take 1,200 mg by mouth 2 (two) times daily.         [name] ibuprofen (ADVIL,MOTRIN) 200 MG tablet   Oral   Take 400 mg by mouth every 6 (six) hours as needed.         [name] azithromycin (ZITHROMAX Z-PAK) 250 MG  tablet   Oral   Take 1 tablet (250 mg total) by mouth daily. Take 2 tabs for first dose, then 1 tab for each additional dose   6 tablet   0   . HYDROcodone-acetaminophen (NORCO/VICODIN) 5-325 MG per tablet   Oral   Take 1 tablet by mouth every 4 (four) hours as needed for moderate pain.   15 tablet   0    BP 144/78  Pulse 80  Temp(Src) 98.1 F (36.7 C) (Oral)  Resp 20  SpO2 96% Physical Exam  Nursing note and vitals reviewed. Constitutional: He is oriented to person, place, and time. He appears well-developed and well-nourished.  HENT:  Head: Normocephalic and atraumatic.  Eyes: EOM are normal.  Neck: Normal range of motion.  Cardiovascular: Normal rate, regular rhythm, normal heart sounds and intact distal pulses.   Pulmonary/Chest: Effort normal and breath sounds normal. No respiratory distress. He has no wheezes. He has no rales.  Abdominal: Soft. He exhibits no distension. There is no tenderness.  Musculoskeletal: Normal range of motion.  Neurological: He is alert and oriented to person, place, and time.  Skin: Skin is warm and dry.  Psychiatric: He has a normal mood and affect. Judgment normal.    ED Course  Procedures (including critical care time) Labs Review Labs Reviewed -  No data to display Imaging Review Dg Chest 2 View  09/12/2013   CLINICAL DATA:  Cough, congestion for several days.  EXAM: CHEST  2 VIEW  COMPARISON:  07/29/2013.  FINDINGS: The cardiomediastinal silhouette is normal. There is no focal consolidation, pleural effusion or pneumothorax. There is left basilar atelectasis. The osseous structures are unremarkable.  IMPRESSION: No acute disease of the chest.   Electronically Signed   By: Elige Ko   On: 09/12/2013 11:40  I personally reviewed the imaging tests through PACS system I reviewed available ER/hospitalization records through the EMR   EKG Interpretation   None       MDM   1. Cough   2. Bronchitis    12:53 PM Patient feels  much better at this time.  Discharge home with a diagnosis of bronchitis.  I recommended that he stop smoking cigarettes.  He understands return to the ER for new or worsening symptoms.    Lyanne Co, MD 09/12/13 1254

## 2013-09-12 NOTE — ED Notes (Signed)
Pt from home reports that he has had  productive cough with green sputum x3days. Pt adds that his L side is hurting from coughing.  Pt taking mucinex with no relief.Pt is A&O and in NAD

## 2013-09-12 NOTE — ED Notes (Signed)
Pt desat to 90% on RA after walking to room. Pt placed on 2L. RN Ajsa notified

## 2013-10-09 ENCOUNTER — Emergency Department (HOSPITAL_BASED_OUTPATIENT_CLINIC_OR_DEPARTMENT_OTHER): Payer: Self-pay

## 2013-10-09 ENCOUNTER — Encounter (HOSPITAL_BASED_OUTPATIENT_CLINIC_OR_DEPARTMENT_OTHER): Payer: Self-pay | Admitting: Emergency Medicine

## 2013-10-09 ENCOUNTER — Emergency Department (HOSPITAL_BASED_OUTPATIENT_CLINIC_OR_DEPARTMENT_OTHER)
Admission: EM | Admit: 2013-10-09 | Discharge: 2013-10-09 | Disposition: A | Discharge status: Home / Self Care | Payer: Self-pay | Attending: Emergency Medicine | Admitting: Emergency Medicine

## 2013-10-09 DIAGNOSIS — R5383 Other fatigue: Secondary | ICD-10-CM | POA: Insufficient documentation

## 2013-10-09 DIAGNOSIS — IMO0002 Reserved for concepts with insufficient information to code with codable children: Secondary | ICD-10-CM | POA: Insufficient documentation

## 2013-10-09 DIAGNOSIS — F17200 Nicotine dependence, unspecified, uncomplicated: Secondary | ICD-10-CM | POA: Insufficient documentation

## 2013-10-09 DIAGNOSIS — Z79899 Other long term (current) drug therapy: Secondary | ICD-10-CM | POA: Insufficient documentation

## 2013-10-09 DIAGNOSIS — R079 Chest pain, unspecified: Secondary | ICD-10-CM | POA: Insufficient documentation

## 2013-10-09 DIAGNOSIS — Z872 Personal history of diseases of the skin and subcutaneous tissue: Secondary | ICD-10-CM | POA: Insufficient documentation

## 2013-10-09 DIAGNOSIS — J40 Bronchitis, not specified as acute or chronic: Secondary | ICD-10-CM

## 2013-10-09 DIAGNOSIS — M549 Dorsalgia, unspecified: Secondary | ICD-10-CM | POA: Insufficient documentation

## 2013-10-09 DIAGNOSIS — J209 Acute bronchitis, unspecified: Secondary | ICD-10-CM | POA: Insufficient documentation

## 2013-10-09 DIAGNOSIS — R5381 Other malaise: Secondary | ICD-10-CM | POA: Insufficient documentation

## 2013-10-09 DIAGNOSIS — Z7982 Long term (current) use of aspirin: Secondary | ICD-10-CM | POA: Insufficient documentation

## 2013-10-09 LAB — BASIC METABOLIC PANEL
BUN: 12 mg/dL (ref 6–23)
Chloride: 94 mEq/L — ABNORMAL LOW (ref 96–112)
Creatinine, Ser: 1.2 mg/dL (ref 0.50–1.35)
GFR calc Af Amer: 80 mL/min — ABNORMAL LOW (ref >90)
GFR calc non Af Amer: 69 mL/min — ABNORMAL LOW (ref >90)
Potassium: 4.2 mEq/L (ref 3.5–5.1)
Sodium: 133 mEq/L — ABNORMAL LOW (ref 135–145)

## 2013-10-09 LAB — CBC WITH DIFFERENTIAL/PLATELET
Basophils Absolute: 0 10*3/uL (ref 0.0–0.1)
Basophils Relative: 0 % (ref 0–1)
Eosinophils Absolute: 0 10*3/uL (ref 0.0–0.7)
Hemoglobin: 15.6 g/dL (ref 13.0–17.0)
Lymphs Abs: 0.7 10*3/uL (ref 0.7–4.0)
MCH: 31.5 pg (ref 26.0–34.0)
MCHC: 33.8 g/dL (ref 30.0–36.0)
Monocytes Relative: 15 % — ABNORMAL HIGH (ref 3–12)
Neutro Abs: 7.9 10*3/uL — ABNORMAL HIGH (ref 1.7–7.7)
Neutrophils Relative %: 78 % — ABNORMAL HIGH (ref 43–77)
RBC: 4.95 MIL/uL (ref 4.22–5.81)
RDW: 14.5 % (ref 11.5–15.5)

## 2013-10-09 LAB — PRO B NATRIURETIC PEPTIDE: Pro B Natriuretic peptide (BNP): 189.7 pg/mL — ABNORMAL HIGH (ref 0–125)

## 2013-10-09 MED ORDER — IOHEXOL 350 MG/ML SOLN
80.0000 mL | Freq: Once | INTRAVENOUS | Status: AC | PRN
  Administered 2013-10-09: 80 mL via INTRAVENOUS

## 2013-10-09 MED ORDER — DOXYCYCLINE HYCLATE 100 MG PO CAPS: 100.0000 mg | ORAL_CAPSULE | Freq: Two times a day (BID) | ORAL | Status: DC

## 2013-10-09 MED ORDER — SODIUM CHLORIDE 0.9 % IV BOLUS (SEPSIS)
500.0000 mL | Freq: Once | INTRAVENOUS | Status: AC
  Administered 2013-10-09: 500 mL via INTRAVENOUS

## 2013-10-09 MED ORDER — ALBUTEROL SULFATE (5 MG/ML) 0.5% IN NEBU
2.5000 mg | INHALATION_SOLUTION | RESPIRATORY_TRACT | Status: DC
  Administered 2013-10-09 (×2): 2.5 mg via RESPIRATORY_TRACT
  Filled 2013-10-09 (×2): qty 0.5

## 2013-10-09 MED ORDER — OXYCODONE-ACETAMINOPHEN 5-325 MG PO TABS
2.0000 | ORAL_TABLET | Freq: Once | ORAL | Status: AC
  Administered 2013-10-09: 2 via ORAL
  Filled 2013-10-09: qty 2

## 2013-10-09 MED ORDER — IPRATROPIUM BROMIDE 0.02 % IN SOLN
0.5000 mg | RESPIRATORY_TRACT | Status: DC
  Administered 2013-10-09 (×2): 0.5 mg via RESPIRATORY_TRACT
  Filled 2013-10-09 (×2): qty 2.5

## 2013-10-09 MED ORDER — OXYCODONE-ACETAMINOPHEN 5-325 MG PO TABS: 2.0000 | ORAL_TABLET | ORAL | Status: DC | PRN

## 2013-10-09 MED ORDER — PREDNISONE 20 MG PO TABS: ORAL_TABLET | ORAL | Status: DC

## 2013-10-09 MED ORDER — ALBUTEROL SULFATE HFA 108 (90 BASE) MCG/ACT IN AERS
2.0000 | INHALATION_SPRAY | RESPIRATORY_TRACT | Status: DC | PRN
  Administered 2013-10-09: 2 via RESPIRATORY_TRACT
  Filled 2013-10-09: qty 6.7

## 2013-10-09 MED ORDER — ACETAMINOPHEN 500 MG PO TABS
ORAL_TABLET | ORAL | Status: AC
  Administered 2013-10-09: 1000 mg via ORAL
  Filled 2013-10-09: qty 2

## 2013-10-09 MED ORDER — PREDNISONE 50 MG PO TABS
60.0000 mg | ORAL_TABLET | Freq: Once | ORAL | Status: AC
  Administered 2013-10-09: 60 mg via ORAL
  Filled 2013-10-09 (×2): qty 1

## 2013-10-09 MED ORDER — ACETAMINOPHEN 500 MG PO TABS
1000.0000 mg | ORAL_TABLET | Freq: Once | ORAL | Status: AC
  Administered 2013-10-09: 1000 mg via ORAL

## 2013-10-09 NOTE — ED Notes (Signed)
Pt placed back on O2 @ 2L per [place].

## 2013-10-09 NOTE — ED Provider Notes (Signed)
CSN: [id number]     Arrival date & time 10/09/13  0905 History   First MD Initiated Contact with Patient 10/09/13 0920     Chief Complaint  Patient presents with  . Cough   (Consider location/radiation/quality/duration/timing/severity/associated sxs/prior Treatment) HPI Comments: Patient presents with cough and fever. He states that yesterday he started having some runny nose and nasal congestion. He took the Mucinex which helped a little bit. He then had a cough which is mostly nonproductive but at times productive of some yellow sputum. He woke up this morning and noted to have a fever. He denies any significant myalgias. He states that some chest and back pain is worse only with coughing. He denies any pleuritic-type pain. He has some mild shortness of breath. He denies any past history of lung disease although he is a smoker. He was recently seen here on November 9 for similar symptoms and diagnosed with bronchitis. It is not. He was started on antibiotics at that time. He denies any leg pain or swelling. He is not on oxygen at home and was noted to have an initial oxygen saturation of 84%.  Patient is a 52 y.o. male presenting with cough.  Cough Associated symptoms: chest pain, fever, rhinorrhea and shortness of breath   Associated symptoms: no chills, no diaphoresis, no headaches and no rash     Past Medical History  Diagnosis Date  . Eczema    No past surgical history on file. No family history on file. History  Substance Use Topics  . Smoking status: Current Every Day Smoker -- 1.00 packs/day for 30 years    Types: Cigarettes  . Smokeless tobacco: Not on file  . Alcohol Use: Yes     Comment: occassionally    Review of Systems  Constitutional: Positive for fever and fatigue. Negative for chills and diaphoresis.  HENT: Positive for congestion and rhinorrhea. Negative for sneezing.   Eyes: Negative.   Respiratory: Positive for cough and shortness of breath. Negative for chest  tightness.   Cardiovascular: Positive for chest pain. Negative for leg swelling.  Gastrointestinal: Negative for nausea, vomiting, abdominal pain, diarrhea and blood in stool.  Genitourinary: Negative for frequency, hematuria, flank pain and difficulty urinating.  Musculoskeletal: Positive for back pain. Negative for arthralgias.  Skin: Negative for rash.  Neurological: Negative for dizziness, speech difficulty, weakness, numbness and headaches.    Allergies  Review of patient's allergies indicates no known allergies.  Home Medications   Current Outpatient Rx  Name  Route  Sig  Dispense  Refill  . guaiFENesin (MUCINEX) 600 MG 12 hr tablet   Oral   Take 1,200 mg by mouth 2 (two) times daily.         [name] aspirin 325 MG EC tablet   Oral   Take 325 mg by mouth daily.         [name] doxycycline (VIBRAMYCIN) 100 MG capsule   Oral   Take 1 capsule (100 mg total) by mouth 2 (two) times daily. One po bid x 7 days   14 capsule   0   . oxyCODONE-acetaminophen (PERCOCET) 5-325 MG per tablet   Oral   Take 2 tablets by mouth every 4 (four) hours as needed.   20 tablet   0   . predniSONE (DELTASONE) 20 MG tablet      2 tabs daily x 4 days   8 tablet   0    BP 139/72  Pulse 92  Temp(Src) 99.5 F (37.5  C) (Oral)  Resp 18  Ht 5\' 8"  (1.727 m)  Wt 230 lb (104.327 kg)  BMI 34.98 kg/m2  SpO2 92% Physical Exam  Constitutional: He is oriented to person, place, and time. He appears well-developed and well-nourished.  HENT:  Head: Normocephalic and atraumatic.  Right Ear: External ear normal.  Left Ear: External ear normal.  Mouth/Throat: Oropharynx is clear and moist.  Eyes: Pupils are equal, round, and reactive to light.  Neck: Normal range of motion. Neck supple.  Cardiovascular: Normal rate, regular rhythm and normal heart sounds.   Pulmonary/Chest: Effort normal. No respiratory distress. He has wheezes. He has no rales. He exhibits no tenderness.  Patient has some crackles  bilaterally with diminished breath sounds bilaterally. He has no significantly increased work of breathing  Abdominal: Soft. Bowel sounds are normal. There is no tenderness. There is no rebound and no guarding.  Musculoskeletal: Normal range of motion. He exhibits no edema.  No calf tenderness  Lymphadenopathy:    He has no cervical adenopathy.  Neurological: He is alert and oriented to person, place, and time.  Skin: Skin is warm and dry. No rash noted.  Psychiatric: He has a normal mood and affect.    ED Course  Procedures (including critical care time) Labs Review Labs Reviewed  CBC WITH DIFFERENTIAL - Abnormal; Notable for the following:    Neutrophils Relative % 78 (*)    Neutro Abs 7.9 (*)    Lymphocytes Relative 7 (*)    Monocytes Relative 15 (*)    Monocytes Absolute 1.5 (*)    All other components within normal limits  BASIC METABOLIC PANEL - Abnormal; Notable for the following:    Sodium 133 (*)    Chloride 94 (*)    Glucose, Bld 101 (*)    GFR calc non Af Amer 69 (*)    GFR calc Af Amer 80 (*)    All other components within normal limits  PRO B NATRIURETIC PEPTIDE - Abnormal; Notable for the following:    Pro B Natriuretic peptide (BNP) 189.7 (*)    All other components within normal limits   Imaging Review Dg Chest 2 View  10/09/2013   CLINICAL DATA:  Cough.  Hypoxia.  EXAM: CHEST  2 VIEW  COMPARISON:  09/12/2013  FINDINGS: Heart size and pulmonary vascularity are normal. No infiltrates or effusions. Slight peribronchial thickening seen on the lateral view consistent with bronchitis. No significant osseous abnormality.  IMPRESSION: Slight bronchitic changes.   Electronically Signed   By: Geanie Cooley M.D.   On: 10/09/2013 10:05   Ct Angio Chest Pe W/cm &/or Wo Cm  10/09/2013   CLINICAL DATA:  Nonproductive cough and dyspnea  EXAM: CT ANGIOGRAPHY CHEST WITH CONTRAST  TECHNIQUE: Multidetector CT imaging of the chest was performed using the standard protocol during  bolus administration of intravenous contrast. Multiplanar CT image reconstructions including MIPs were obtained to evaluate the vascular anatomy.  CONTRAST:  80mL OMNIPAQUE IOHEXOL 350 MG/ML SOLN  COMPARISON:  Chest x-ray of October 09, 2013.  FINDINGS: Contrast within the pulmonary arterial tree is normal in appearance. There are no filling defects to suggest an acute pulmonary embolism. Review of the MIP images confirms the above findings. The caliber of the thoracic aorta is normal. There is no false lumen. The cardiac chambers are normal in size. There is no pleural nor pericardial effusion. The thoracic esophagus is of normal caliber. There are no bulky mediastinal lymph nodes. There are a few top-normal hilar  lymph nodes. On image 54 of series 2 a lymph node is demonstrated which measures just over 10 mm in diameter. A similar size lymph node is noted in the left hilar region on image 49 of series 4. There are mildly enlarged right hilar lymph nodes and borderline enlarged left hilar lymph nodes.  At lung window settings there are no interstitial nor alveolar infiltrates. There are no suspicious pulmonary parenchymal nodules or masses.  The thoracic vertebral bodies are preserved in height. There is degenerative disc change at multiple levels with large anterior near bridging osteophytes. The sternum exhibits no acute abnormality. The lateral aspect of the left 7th rib exhibits a fracture line with surrounding periosteal reaction and considerable soft tissue swelling. No other rib lesions are demonstrated. The adjacent pulmonary parenchyma is normal though there is mild pleural thickening here.  Within the upper abdomen the observed portions of the liver and spleen appear normal. The partially imaged adrenal glands are grossly normal as well.  IMPRESSION: 1. There is no evidence of an acute pulmonary embolism nor acute thoracic aortic pathology. 2. There is no evidence of pneumonia or pulmonary parenchymal  masses. 3. There is an abnormal appearance of the lateral aspect of the left 7th rib. This may merely reflect a healing fracture but underlying destructive process ease cannot be excluded. Is there a history of trauma here? If there is no history of trauma, oncologic workup including CT scanning of the abdomen and pelvis and nuclear bone scanning may be indicated. 4. There are mildly enlarged right axillary lymph nodes and borderline enlarged left axillary lymph nodes. No bulky mediastinal or hilar lymphadenopathy is demonstrated.   Electronically Signed   By: David  [place]   On: 10/09/2013 12:54    EKG Interpretation    Date/Time:  Saturday October 09 2013 09:46:18 EST Ventricular Rate:  110 PR Interval:  184 QRS Duration: 92 QT Interval:  320 QTC Calculation: 433 R Axis:   88 Text Interpretation:  Sinus tachycardia Otherwise normal ECG Since last tracing rate faster Confirmed by Chiana Wamser  MD, Zareen Jamison ([id number]) on 10/09/2013 9:47:59 AM            MDM   1. Bronchitis    Patient presents with cough and fever. He has no evidence of pneumonia. His symptoms are consistent with bronchitis and he is an ongoing smoker. He had some ongoing hypoxia in the ED down into the mid 80s. It did improve with nebulizer treatments. Currently he is talking in full sentences and denies shortness of breath. He has no increased work of breathing. His nausea and saturation is 92-93 percent on room air. Given his ongoing hypoxia did do a CT scan of his chest that did not show any evidence of pulmonary    Rolan Bucco, MD 10/09/13 1453

## 2013-10-09 NOTE — ED Notes (Signed)
Pt had a 20 gauge NSL that was in place on my initial assessment.  Removed prior to discharge without incidence.

## 2013-10-09 NOTE — ED Notes (Signed)
Pt states he had non-productive cough yesterday.  Pt states he woke up this am with fever.

## 2013-11-19 ENCOUNTER — Emergency Department (HOSPITAL_BASED_OUTPATIENT_CLINIC_OR_DEPARTMENT_OTHER): Payer: Self-pay

## 2013-11-19 ENCOUNTER — Emergency Department (HOSPITAL_BASED_OUTPATIENT_CLINIC_OR_DEPARTMENT_OTHER)
Admission: EM | Admit: 2013-11-19 | Discharge: 2013-11-19 | Disposition: A | Discharge status: Home / Self Care | Payer: Self-pay | Attending: Emergency Medicine | Admitting: Emergency Medicine

## 2013-11-19 ENCOUNTER — Encounter (HOSPITAL_BASED_OUTPATIENT_CLINIC_OR_DEPARTMENT_OTHER): Payer: Self-pay | Admitting: Emergency Medicine

## 2013-11-19 DIAGNOSIS — J441 Chronic obstructive pulmonary disease with (acute) exacerbation: Secondary | ICD-10-CM | POA: Diagnosis present

## 2013-11-19 DIAGNOSIS — F17200 Nicotine dependence, unspecified, uncomplicated: Secondary | ICD-10-CM | POA: Insufficient documentation

## 2013-11-19 DIAGNOSIS — IMO0002 Reserved for concepts with insufficient information to code with codable children: Secondary | ICD-10-CM | POA: Insufficient documentation

## 2013-11-19 DIAGNOSIS — Z872 Personal history of diseases of the skin and subcutaneous tissue: Secondary | ICD-10-CM | POA: Insufficient documentation

## 2013-11-19 DIAGNOSIS — Z7982 Long term (current) use of aspirin: Secondary | ICD-10-CM | POA: Insufficient documentation

## 2013-11-19 LAB — CBC WITH DIFFERENTIAL/PLATELET
Basophils Absolute: 0 10*3/uL (ref 0.0–0.1)
Basophils Relative: 0 % (ref 0–1)
EOS PCT: 0 % (ref 0–5)
Eosinophils Absolute: 0.1 10*3/uL (ref 0.0–0.7)
HEMATOCRIT: 48.2 % (ref 39.0–52.0)
HEMOGLOBIN: 16 g/dL (ref 13.0–17.0)
LYMPHS ABS: 3.3 10*3/uL (ref 0.7–4.0)
LYMPHS PCT: 23 % (ref 12–46)
MCH: 31.1 pg (ref 26.0–34.0)
MCHC: 33.2 g/dL (ref 30.0–36.0)
MCV: 93.8 fL (ref 78.0–100.0)
MONO ABS: 1.3 10*3/uL — AB (ref 0.1–1.0)
MONOS PCT: 9 % (ref 3–12)
Neutro Abs: 9.5 10*3/uL — ABNORMAL HIGH (ref 1.7–7.7)
Neutrophils Relative %: 67 % (ref 43–77)
Platelets: 240 10*3/uL (ref 150–400)
RBC: 5.14 MIL/uL (ref 4.22–5.81)
RDW: 14.9 % (ref 11.5–15.5)
WBC: 14.1 10*3/uL — AB (ref 4.0–10.5)

## 2013-11-19 LAB — COMPREHENSIVE METABOLIC PANEL
ALT: 15 U/L (ref 0–53)
AST: 18 U/L (ref 0–37)
Albumin: 3.7 g/dL (ref 3.5–5.2)
Alkaline Phosphatase: 97 U/L (ref 39–117)
BILIRUBIN TOTAL: 0.5 mg/dL (ref 0.3–1.2)
BUN: 11 mg/dL (ref 6–23)
CHLORIDE: 97 meq/L (ref 96–112)
CO2: 30 mEq/L (ref 19–32)
CREATININE: 1 mg/dL (ref 0.50–1.35)
Calcium: 8.9 mg/dL (ref 8.4–10.5)
GFR calc Af Amer: >90 mL/min (ref >90)
GFR calc non Af Amer: 86 mL/min — ABNORMAL LOW (ref >90)
GLUCOSE: 96 mg/dL (ref 70–99)
Potassium: 4.6 mEq/L (ref 3.7–5.3)
Sodium: 138 mEq/L (ref 137–147)
Total Protein: 7.9 g/dL (ref 6.0–8.3)

## 2013-11-19 LAB — PRO B NATRIURETIC PEPTIDE: PRO B NATRI PEPTIDE: 388.3 pg/mL — AB (ref 0–125)

## 2013-11-19 LAB — TROPONIN I: Troponin I: <0.3 ng/mL (ref <0.30)

## 2013-11-19 MED ORDER — ALBUTEROL SULFATE HFA 108 (90 BASE) MCG/ACT IN AERS
6.0000 | INHALATION_SPRAY | Freq: Once | RESPIRATORY_TRACT | Status: AC
  Administered 2013-11-19: 6 via RESPIRATORY_TRACT
  Filled 2013-11-19: qty 6.7

## 2013-11-19 MED ORDER — ALBUTEROL SULFATE (2.5 MG/3ML) 0.083% IN NEBU
5.0000 mg | INHALATION_SOLUTION | Freq: Once | RESPIRATORY_TRACT | Status: AC
  Administered 2013-11-19: 5 mg via RESPIRATORY_TRACT
  Filled 2013-11-19: qty 6

## 2013-11-19 MED ORDER — ALBUTEROL SULFATE HFA 108 (90 BASE) MCG/ACT IN AERS: 6.0000 | INHALATION_SPRAY | Freq: Once | RESPIRATORY_TRACT | Status: DC

## 2013-11-19 MED ORDER — ALBUTEROL (5 MG/ML) CONTINUOUS INHALATION SOLN
10.0000 mg/h | INHALATION_SOLUTION | RESPIRATORY_TRACT | Status: AC
  Administered 2013-11-19: 10 mg/h via RESPIRATORY_TRACT
  Filled 2013-11-19: qty 20

## 2013-11-19 MED ORDER — PREDNISONE 50 MG PO TABS: 60.0000 mg | ORAL_TABLET | Freq: Once | ORAL | Status: DC

## 2013-11-19 MED ORDER — METHYLPREDNISOLONE SODIUM SUCC 125 MG IJ SOLR
125.0000 mg | Freq: Once | INTRAMUSCULAR | Status: AC
  Administered 2013-11-19: 125 mg via INTRAVENOUS
  Filled 2013-11-19: qty 2

## 2013-11-19 MED ORDER — SODIUM CHLORIDE 0.9 % IV BOLUS (SEPSIS)
500.0000 mL | Freq: Once | INTRAVENOUS | Status: AC
Start: 2013-11-19 — End: 2013-11-19
  Administered 2013-11-19: 500 mL via INTRAVENOUS

## 2013-11-19 MED ORDER — PREDNISONE 50 MG PO TABS: ORAL_TABLET | ORAL | Status: DC

## 2013-11-19 NOTE — ED Notes (Signed)
MD spoke with pt regarding admission, states he feels better after breathing tx and wanting to be discharged. Given PO medications to take at home. Told to return if symptoms worsen.

## 2013-11-19 NOTE — ED Provider Notes (Signed)
CSN: [id number]     Arrival date & time 11/19/13  1223 History   First MD Initiated Contact with Patient 11/19/13 1322     Chief Complaint  Patient presents with  . Shortness of Breath   (Consider location/radiation/quality/duration/timing/severity/associated sxs/prior Treatment) Patient is a 53 y.o. male presenting with shortness of breath. The history is provided by the patient.  Shortness of Breath Severity:  Mild Onset quality:  Gradual Duration:  12 hours Timing:  Intermittent Progression:  Unchanged Chronicity:  New Context: activity   Relieved by:  Rest Worsened by:  Exertion Ineffective treatments:  None tried Associated symptoms: cough   Associated symptoms: no abdominal pain, no chest pain, no fever, no headaches, no neck pain and no vomiting     Past Medical History  Diagnosis Date  . Eczema    History reviewed. No pertinent past surgical history. No family history on file. History  Substance Use Topics  . Smoking status: Current Every Day Smoker -- 0.50 packs/day for 30 years    Types: Cigarettes  . Smokeless tobacco: Not on file  . Alcohol Use: Yes     Comment: occassionally    Review of Systems  Constitutional: Negative for fever.  HENT: Negative for drooling and rhinorrhea.   Eyes: Negative for pain.  Respiratory: Positive for cough and shortness of breath.   Cardiovascular: Negative for chest pain and leg swelling.       Chest wall pain  Gastrointestinal: Negative for nausea, vomiting, abdominal pain and diarrhea.  Genitourinary: Negative for dysuria and hematuria.  Musculoskeletal: Negative for gait problem and neck pain.  Skin: Negative for color change.  Neurological: Negative for numbness and headaches.  Hematological: Negative for adenopathy.  Psychiatric/Behavioral: Negative for behavioral problems.  All other systems reviewed and are negative.    Allergies  Review of patient's allergies indicates no known allergies.  Home Medications    Current Outpatient Rx  Name  Route  Sig  Dispense  Refill  . aspirin 325 MG EC tablet   Oral   Take 325 mg by mouth daily.         [name] doxycycline (VIBRAMYCIN) 100 MG capsule   Oral   Take 1 capsule (100 mg total) by mouth 2 (two) times daily. One po bid x 7 days   14 capsule   0   . guaiFENesin (MUCINEX) 600 MG 12 hr tablet   Oral   Take 1,200 mg by mouth 2 (two) times daily.         [name] oxyCODONE-acetaminophen (PERCOCET) 5-325 MG per tablet   Oral   Take 2 tablets by mouth every 4 (four) hours as needed.   20 tablet   0   . predniSONE (DELTASONE) 20 MG tablet      2 tabs daily x 4 days   8 tablet   0    BP 147/102  Pulse 96  Temp(Src) 98.7 F (37.1 C) (Oral)  Resp 18  Ht 5\' 8"  (1.727 m)  Wt 225 lb (102.059 kg)  BMI 34.22 kg/m2  SpO2 92% Physical Exam  Nursing note and vitals reviewed. Constitutional: He is oriented to person, place, and time. He appears well-developed and well-nourished.  HENT:  Head: Normocephalic and atraumatic.  Right Ear: External ear normal.  Left Ear: External ear normal.  Nose: Nose normal.  Mouth/Throat: Oropharynx is clear and moist. No oropharyngeal exudate.  Eyes: Conjunctivae and EOM are normal. Pupils are equal, round, and reactive to light.  Neck: Normal range  of motion. Neck supple.  Cardiovascular: Normal rate, regular rhythm, normal heart sounds and intact distal pulses.  Exam reveals no gallop and no friction rub.   No murmur heard. Pulmonary/Chest: Effort normal. No respiratory distress. He has no wheezes. He exhibits tenderness (tenderness to palpation of the left lower lateral ribs.).  Mildly prolonged expiratory phase. Diminished lung sounds in the bases.   Abdominal: Soft. Bowel sounds are normal. He exhibits no distension. There is no tenderness. There is no rebound and no guarding.  Musculoskeletal: Normal range of motion. He exhibits no edema and no tenderness.  Neurological: He is alert and oriented to person,  place, and time.  Skin: Skin is warm and dry.  Psychiatric: He has a normal mood and affect. His behavior is normal.    ED Course  Procedures (including critical care time) Labs Review Labs Reviewed  CBC WITH DIFFERENTIAL - Abnormal; Notable for the following:    WBC 14.1 (*)    Neutro Abs 9.5 (*)    Monocytes Absolute 1.3 (*)    All other components within normal limits  COMPREHENSIVE METABOLIC PANEL - Abnormal; Notable for the following:    GFR calc non Af Amer 86 (*)    All other components within normal limits  PRO B NATRIURETIC PEPTIDE - Abnormal; Notable for the following:    Pro B Natriuretic peptide (BNP) 388.3 (*)    All other components within normal limits  TROPONIN I   Imaging Review Dg Chest 2 View  11/19/2013   CLINICAL DATA:  Shortness of breath and chest tightness  EXAM: CHEST  2 VIEW  COMPARISON:  10/09/2013  FINDINGS: Mild cardiac enlargement. Mild vascular congestion. Hilar and mediastinal contours are normal. There is no consolidation or effusion. There is mild stable pleural thickening laterally in the left mid lung zone. This is unchanged the prior study. This corresponds to prior rib fracture as seen on CT scan performed 10/09/2013. Mild peribronchial thickening is stable with minimal hyperinflation.  IMPRESSION: Stable findings of mild COPD.  Stable mild cardiac enlargement.  Mild increase in vascular congestion, with no evidence of pulmonary edema.   Electronically Signed   By: Skipper Cliche M.D.   On: 11/19/2013 13:43    EKG Interpretation    Date/Time:  Friday November 19 2013 13:45:45 EST Ventricular Rate:  89 PR Interval:  192 QRS Duration: 96 QT Interval:  346 QTC Calculation: 420 R Axis:   -51 Text Interpretation:  Normal sinus rhythm Left axis deviation Incomplete right bundle branch block Non specific T wave abnormality in V1-V3, seen previously in V1-V2, now in V3 Confirmed by Cecia Egge  MD, Lacinda Curvin ([id number]) on 11/19/2013 2:01:00 PM             MDM   1. COPD exacerbation    1:55 PM 53 y.o. male who presents with mild shortness of breath which began today. He states that he has had a cough productive of white sputum for approximately one week. He also notes some left lateral rib pain and was noted to have a fracture there on CT performed approximately one month ago. He was seen here one month ago for similar symptoms and was noted to be hypoxic at that time. He had a  negative CT PE study and his oxygen saturation improved w/ nebs while in the ED during that visit. He is afebrile and his oxygen saturation currently is 90% on room air. He denies any shortness of breath currently. He denies any chest pain but  does have a left lateral rib pain which is easily reproduced on exam. Will get screening labwork and breathing treatment as he notes improvement in the past with this. Chest x-ray shows mild COPD which is likely the culprit of his hypoxia.  Pt now w/ more prominent wheezing after breathing treatment.   Similar to his previous visit, his O2 sat decreased to upper 80's. Possibly V/Q mismatch after getting a breathing treatment. I discussed admission with him. He would prefer to get a few more breathing treatments here and does not want to be admitted. I recommended he establish with a pcp and he states he has the means to do this now. I discussed return precautions with him such as worsening sob, cp, fever.     Blanchard Kelch, MD 11/20/13 [phone]

## 2013-11-19 NOTE — ED Notes (Signed)
MD at bedside. 

## 2013-11-19 NOTE — ED Provider Notes (Signed)
Patient feeling much better.  He states he wants to go home.  Patient's O2 saturation on room air was ranging between 88 and 92%.  He will be discharged with prednisone which he says he will get filled.  Dot Lanes, MD 11/19/13 1800

## 2013-11-19 NOTE — ED Notes (Signed)
Sob and pain in his left ribs with movement and cough. Diagnosed with bronchitis a month ago.

## 2013-11-19 NOTE — ED Notes (Signed)
Oxygen decreased to room air after HHN treatment.  SpO2 85-90% on room air with no dyspnea noted.  BBS clearing, productive cough, with thick white secretions.  O2 reapplied at 1LPM.  MD aware.

## 2014-03-27 ENCOUNTER — Emergency Department (HOSPITAL_COMMUNITY): Payer: Self-pay

## 2014-03-27 ENCOUNTER — Inpatient Hospital Stay (HOSPITAL_COMMUNITY)
Admission: EM | Admit: 2014-03-27 | Discharge: 2014-03-29 | DRG: 291 | Disposition: A | Discharge status: Home / Self Care | Payer: Self-pay | Attending: Internal Medicine | Admitting: Internal Medicine

## 2014-03-27 ENCOUNTER — Encounter (HOSPITAL_COMMUNITY): Payer: Self-pay | Admitting: Emergency Medicine

## 2014-03-27 DIAGNOSIS — F17200 Nicotine dependence, unspecified, uncomplicated: Secondary | ICD-10-CM | POA: Diagnosis present

## 2014-03-27 DIAGNOSIS — Z79899 Other long term (current) drug therapy: Secondary | ICD-10-CM

## 2014-03-27 DIAGNOSIS — I5031 Acute diastolic (congestive) heart failure: Principal | ICD-10-CM

## 2014-03-27 DIAGNOSIS — J9600 Acute respiratory failure, unspecified whether with hypoxia or hypercapnia: Secondary | ICD-10-CM | POA: Diagnosis present

## 2014-03-27 DIAGNOSIS — J441 Chronic obstructive pulmonary disease with (acute) exacerbation: Secondary | ICD-10-CM

## 2014-03-27 DIAGNOSIS — I509 Heart failure, unspecified: Secondary | ICD-10-CM | POA: Diagnosis present

## 2014-03-27 DIAGNOSIS — J9611 Chronic respiratory failure with hypoxia: Secondary | ICD-10-CM | POA: Diagnosis present

## 2014-03-27 DIAGNOSIS — J449 Chronic obstructive pulmonary disease, unspecified: Secondary | ICD-10-CM

## 2014-03-27 DIAGNOSIS — J9691 Respiratory failure, unspecified with hypoxia: Secondary | ICD-10-CM

## 2014-03-27 DIAGNOSIS — Z7982 Long term (current) use of aspirin: Secondary | ICD-10-CM

## 2014-03-27 DIAGNOSIS — IMO0002 Reserved for concepts with insufficient information to code with codable children: Secondary | ICD-10-CM

## 2014-03-27 DIAGNOSIS — L259 Unspecified contact dermatitis, unspecified cause: Secondary | ICD-10-CM | POA: Diagnosis present

## 2014-03-27 DIAGNOSIS — Z720 Tobacco use: Secondary | ICD-10-CM

## 2014-03-27 DIAGNOSIS — R600 Localized edema: Secondary | ICD-10-CM

## 2014-03-27 HISTORY — DX: Chronic obstructive pulmonary disease, unspecified: J44.9

## 2014-03-27 LAB — BASIC METABOLIC PANEL
BUN: 25 mg/dL — ABNORMAL HIGH (ref 6–23)
CHLORIDE: 94 meq/L — AB (ref 96–112)
CO2: 34 mEq/L — ABNORMAL HIGH (ref 19–32)
Calcium: 8.6 mg/dL (ref 8.4–10.5)
Creatinine, Ser: 1.16 mg/dL (ref 0.50–1.35)
GFR calc Af Amer: 83 mL/min — ABNORMAL LOW (ref >90)
GFR calc non Af Amer: 72 mL/min — ABNORMAL LOW (ref >90)
GLUCOSE: 103 mg/dL — AB (ref 70–99)
POTASSIUM: 4.3 meq/L (ref 3.7–5.3)
Sodium: 136 mEq/L — ABNORMAL LOW (ref 137–147)

## 2014-03-27 LAB — CBC
HCT: 50.4 % (ref 39.0–52.0)
HEMOGLOBIN: 16.4 g/dL (ref 13.0–17.0)
MCH: 31.5 pg (ref 26.0–34.0)
MCHC: 32.5 g/dL (ref 30.0–36.0)
MCV: 96.7 fL (ref 78.0–100.0)
Platelets: 280 10*3/uL (ref 150–400)
RBC: 5.21 MIL/uL (ref 4.22–5.81)
RDW: 15.1 % (ref 11.5–15.5)
WBC: 9.5 10*3/uL (ref 4.0–10.5)

## 2014-03-27 LAB — TROPONIN I
Troponin I: <0.3 ng/mL
Troponin I: <0.3 ng/mL (ref <0.30)

## 2014-03-27 LAB — PRO B NATRIURETIC PEPTIDE: Pro B Natriuretic peptide (BNP): 5198 pg/mL — ABNORMAL HIGH (ref 0–125)

## 2014-03-27 LAB — I-STAT TROPONIN, ED: Troponin i, poc: 0.05 ng/mL (ref 0.00–0.08)

## 2014-03-27 LAB — HEMOGLOBIN A1C
Hgb A1c MFr Bld: 5.8 % — ABNORMAL HIGH
Mean Plasma Glucose: 120 mg/dL — ABNORMAL HIGH

## 2014-03-27 MED ORDER — ZOLPIDEM TARTRATE 5 MG PO TABS
5.0000 mg | ORAL_TABLET | Freq: Once | ORAL | Status: AC
  Administered 2014-03-27: 5 mg via ORAL
  Filled 2014-03-27: qty 1

## 2014-03-27 MED ORDER — ASPIRIN EC 81 MG PO TBEC
81.0000 mg | DELAYED_RELEASE_TABLET | Freq: Every day | ORAL | Status: DC
  Administered 2014-03-28 – 2014-03-29 (×2): 81 mg via ORAL
  Filled 2014-03-27 (×2): qty 1

## 2014-03-27 MED ORDER — ALBUTEROL SULFATE (2.5 MG/3ML) 0.083% IN NEBU
5.0000 mg | INHALATION_SOLUTION | Freq: Once | RESPIRATORY_TRACT | Status: AC
  Administered 2014-03-27: 5 mg via RESPIRATORY_TRACT
  Filled 2014-03-27: qty 6

## 2014-03-27 MED ORDER — PREDNISONE 20 MG PO TABS
40.0000 mg | ORAL_TABLET | Freq: Every day | ORAL | Status: DC
  Administered 2014-03-28 – 2014-03-29 (×2): 40 mg via ORAL
  Filled 2014-03-27 (×4): qty 2

## 2014-03-27 MED ORDER — SODIUM CHLORIDE 0.9 % IJ SOLN
3.0000 mL | Freq: Two times a day (BID) | INTRAMUSCULAR | Status: DC
  Administered 2014-03-27 – 2014-03-29 (×5): 3 mL via INTRAVENOUS

## 2014-03-27 MED ORDER — IPRATROPIUM BROMIDE 0.02 % IN SOLN
0.5000 mg | Freq: Once | RESPIRATORY_TRACT | Status: AC
  Administered 2014-03-27: 0.5 mg via RESPIRATORY_TRACT
  Filled 2014-03-27: qty 2.5

## 2014-03-27 MED ORDER — PREDNISONE 20 MG PO TABS
60.0000 mg | ORAL_TABLET | Freq: Once | ORAL | Status: AC
  Administered 2014-03-27: 60 mg via ORAL
  Filled 2014-03-27: qty 3

## 2014-03-27 MED ORDER — FUROSEMIDE 10 MG/ML IJ SOLN
40.0000 mg | Freq: Once | INTRAMUSCULAR | Status: AC
  Filled 2014-03-27: qty 4

## 2014-03-27 MED ORDER — ENOXAPARIN SODIUM 40 MG/0.4ML ~~LOC~~ SOLN
40.0000 mg | SUBCUTANEOUS | Status: DC
  Administered 2014-03-27 – 2014-03-28 (×2): 40 mg via SUBCUTANEOUS
  Filled 2014-03-27 (×3): qty 0.4

## 2014-03-27 MED ORDER — FUROSEMIDE 10 MG/ML IJ SOLN
40.0000 mg | Freq: Once | INTRAMUSCULAR | Status: AC
  Administered 2014-03-27: 40 mg via INTRAVENOUS

## 2014-03-27 MED ORDER — ALBUTEROL SULFATE (2.5 MG/3ML) 0.083% IN NEBU
5.0000 mg | INHALATION_SOLUTION | Freq: Once | RESPIRATORY_TRACT | Status: AC
  Filled 2014-03-27: qty 6

## 2014-03-27 MED ORDER — IPRATROPIUM-ALBUTEROL 0.5-2.5 (3) MG/3ML IN SOLN
3.0000 mL | Freq: Four times a day (QID) | RESPIRATORY_TRACT | Status: DC
  Administered 2014-03-27 – 2014-03-28 (×2): 3 mL via RESPIRATORY_TRACT
  Filled 2014-03-27 (×2): qty 3

## 2014-03-27 MED ORDER — LEVOFLOXACIN 500 MG PO TABS
500.0000 mg | ORAL_TABLET | Freq: Every day | ORAL | Status: DC
  Administered 2014-03-27 – 2014-03-29 (×3): 500 mg via ORAL
  Filled 2014-03-27 (×3): qty 1

## 2014-03-27 MED ORDER — NICOTINE 21 MG/24HR TD PT24
21.0000 mg | MEDICATED_PATCH | Freq: Every day | TRANSDERMAL | Status: DC
  Administered 2014-03-27 – 2014-03-29 (×3): 21 mg via TRANSDERMAL
  Filled 2014-03-27 (×3): qty 1

## 2014-03-27 NOTE — ED Provider Notes (Signed)
CSN: [id number]     Arrival date & time 03/27/14  1019 History   First MD Initiated Contact with Patient 03/27/14 1027     Chief Complaint  Patient presents with  . Shortness of Breath     (Consider location/radiation/quality/duration/timing/severity/associated sxs/prior Treatment) HPI Pt presenting with c/o shortness of breath intermittently feeling lightheaded.  No chest pain.  No fever/chills.  He denies knowing about a diagnosis of COPD.  He does not use inhalers.  He states he had a hx of wheezing as a child but felt that he grew out of it.  Symptoms have been going on for the past several weeks, worse since yesterday.  Some cough productive of clear mucous. No leg swelling. He has not tried anything for his symptoms.   There are no other associated systemic symptoms, there are no other alleviating or modifying factors.   Past Medical History  Diagnosis Date  . Eczema   . COPD (chronic obstructive pulmonary disease)    History reviewed. No pertinent past surgical history. No family history on file. History  Substance Use Topics  . Smoking status: Current Every Day Smoker -- 0.50 packs/day for 30 years    Types: Cigarettes  . Smokeless tobacco: Not on file  . Alcohol Use: Yes     Comment: occassionally    Review of Systems ROS reviewed and all otherwise negative except for mentioned in HPI    Allergies  Review of patient's allergies indicates no known allergies.  Home Medications   Prior to Admission medications   Medication Sig Start Date End Date Taking? Authorizing Provider  aspirin 325 MG EC tablet Take 325 mg by mouth daily.   Yes Historical Provider, MD   BP 141/81  Pulse 102  Temp(Src) 97.8 F (36.6 C) (Oral)  Resp 20  Ht 5\' 7"  (1.702 m)  Wt 224 lb 15.7 oz (102.05 kg)  BMI 35.23 kg/m2  SpO2 100% Vitals reviewed Physical Exam Physical Examination: General appearance - alert, well appearing, and in no distress Mental status - alert, oriented to person,  place, and time Eyes - no conunctival injection, no scleral icterus Mouth - mucous membranes moist, pharynx normal without lesions Chest -  symmetric air entry, bilateral rhonchi and expiratory wheezing, no increased respiratory effort, speaking in full sentences Heart - normal rate, regular rhythm, normal S1, S2, no murmurs, rubs, clicks or gallops Abdomen - soft, nontender, nondistended, no masses or organomegaly Extremities - peripheral pulses normal, no pedal edema, no clubbing or cyanosis Skin - normal coloration and turgor, no rashes  ED Course  Procedures (including critical care time)  2:26 [facility]  He will see patient in the ED.  Will give a dose of lasix due to pulmonary vascular congestion as well as elevated BNP.  Pt feeling better after 3 duonebs.  Labs Review Labs Reviewed  BASIC METABOLIC PANEL - Abnormal; Notable for the following:    Sodium 136 (*)    Chloride 94 (*)    CO2 34 (*)    Glucose, Bld 103 (*)    BUN 25 (*)    GFR calc non Af Amer 72 (*)    GFR calc Af Amer 83 (*)    All other components within normal limits  PRO B NATRIURETIC PEPTIDE - Abnormal; Notable for the following:    Pro B Natriuretic peptide (BNP) 5198.0 (*)    All other components within normal limits  HEMOGLOBIN A1C - Abnormal; Notable for the following:  Hemoglobin A1C 5.8 (*)    Mean Plasma Glucose 120 (*)    All other components within normal limits  COMPREHENSIVE METABOLIC PANEL - Abnormal; Notable for the following:    Sodium 136 (*)    Chloride 93 (*)    CO2 35 (*)    Albumin 3.3 (*)    GFR calc non Af Amer 79 (*)    All other components within normal limits  CBC - Abnormal; Notable for the following:    WBC 11.2 (*)    All other components within normal limits  PRO B NATRIURETIC PEPTIDE - Abnormal; Notable for the following:    Pro B Natriuretic peptide (BNP) 4582.0 (*)    All other components within normal limits  CBC  TROPONIN I  TROPONIN  I  TROPONIN I  I-STAT TROPOININ, ED    Imaging Review Dg Chest 2 View (if Patient Has Fever And/or Copd)  03/27/2014   CLINICAL DATA:  Short red common coughing congestion since Friday  EXAM: CHEST  2 VIEW  COMPARISON:  Prior chest x-ray 11/19/2013  FINDINGS: Is stable cardiomegaly. Mediastinal contours are within normal limits. There is pulmonary vascular congestion without overt edema. No focal airspace consolidation, pleural effusion or pneumothorax. Stable remote healed left-sided rib fracture. No acute osseous abnormality.  IMPRESSION: 1. Cardiomegaly and pulmonary vascular congestion without overt edema.   Electronically Signed   By: Jacqulynn Cadet M.D.   On: 03/27/2014 12:08     EKG Interpretation   Date/Time:  Sunday Mar 27 2014 10:26:16 EDT Ventricular Rate:  91 PR Interval:  170 QRS Duration: 124 QT Interval:  380 QTC Calculation: 467 R Axis:   108 Text Interpretation:  Sinus rhythm LAE, consider biatrial enlargement  Nonspecific intraventricular conduction delay Borderline repolarization  abnormality since previous tracing, t wave in versions now in inferior and  lateral leads Confirmed by Canary Brim  MD, Nevaya Nagele [phone]) on 03/27/2014  10:33:10 AM      MDM   Final diagnoses:  COPD exacerbation  Tobacco abuse  Bilateral leg edema  Respiratory failure with hypoxia    Pt presenting with cough, shortness of breath, most c/w copd exacerabation.  Pt is currently a smoker, counsled against this.  CXR shows some pulmonary vascular congestion as well, there are also some new t wave inversions on his EKG from prior months ago.  Pt treated with albtuerol/atrovent, steroids- he feels somewhat improved.  [facility], given dose of IV lasix in the ED.     Threasa Beards, MD 03/28/14 [phone]

## 2014-03-27 NOTE — ED Notes (Signed)
Pt transported to XRAY °

## 2014-03-27 NOTE — ED Notes (Signed)
Attempted to call report to floor 

## 2014-03-27 NOTE — ED Notes (Signed)
Pt placed on 4L/min O2, spO2 came up to 94 %, RN and MD notified

## 2014-03-27 NOTE — ED Notes (Signed)
MD Linker at bedside.  

## 2014-03-27 NOTE — H&P (Signed)
History and Physical   Ricardo Burch ATF:[id number] DOB: 01-Aug-1961 DOA: 03/27/2014  Referring physician: Dr. Canary Brim PCP: No PCP Per Patient  Specialists: None  Chief Complaint: shortness of breath   HPI: Ricardo Burch is a 53 y.o. male has a past medical history significant for presumed COPD but not getting regular medical care, tobacco abuse, presents to the ED with a chief complaint of shortness of breath and wheezing for the past 24-48 hours. He is not on any regular medications for his COPD and only takes a full dose aspirin, self-prescribed, to help his health. He also has had worsening cough and sputum production for the past day or so. He has no chest pain. He also describes progressive shortness of breath going up stairs or walking of few months duration. In the ED, CXR with cardiomegaly and pulmonary vascular congestion, elevated BNP. Wheezing on initial evaluation by EDP, improved after nebulizers and found to have new oxygen requirements. Denies fever/chills, denies nausea/vomiting/diarrhea.   Review of Systems: as per HPI otherwise negative.   Past Medical History  Diagnosis Date  . Eczema   . COPD (chronic obstructive pulmonary disease)    History reviewed. No pertinent past surgical history. Social History:  reports that he has been smoking Cigarettes.  He has a 15 pack-year smoking history. He does not have any smokeless tobacco history on file. He reports that he drinks alcohol. He reports that he does not use illicit drugs.  No Known Allergies  Family history non contributory   Prior to Admission medications   Medication Sig Start Date End Date Taking? Authorizing Provider  aspirin 325 MG EC tablet Take 325 mg by mouth daily.   Yes Historical Provider, MD   Physical Exam: Filed Vitals:   03/27/14 1145 03/27/14 1232 03/27/14 1258 03/27/14 1322  BP:      Pulse: 83 79  85  Temp:      TempSrc:      Resp: 19 14  14   SpO2: 98% 98% 94% 95%     General:  NAD  Eyes:  no scleral icterus  ENT: moist oropharynx  Neck: supple, probable JVD but thick neck and hard to fully assess  Cardiovascular: regular rate without MRG; 2+ peripheral pulses  Respiratory: overall decreased breath sounds, scant wheezing  Abdomen: soft, non tender to palpation, positive bowel sounds, no guarding, no rebound  Skin: no rashes  Musculoskeletal: 1-2 + pitting LE edema  Psychiatric: normal mood and affect  Neurologic: non focal  Labs on Admission:  Basic Metabolic Panel:  Recent Labs Lab 03/27/14 1045  NA 136*  K 4.3  CL 94*  CO2 34*  GLUCOSE 103*  BUN 25*  CREATININE 1.16  CALCIUM 8.6   Liver Function Tests: No results found for this basename: AST, ALT, ALKPHOS, BILITOT, PROT, ALBUMIN,  in the last 168 hours No results found for this basename: LIPASE, AMYLASE,  in the last 168 hours No results found for this basename: AMMONIA,  in the last 168 hours CBC:  Recent Labs Lab 03/27/14 1045  WBC 9.5  HGB 16.4  HCT 50.4  MCV 96.7  PLT 280   Cardiac Enzymes: No results found for this basename: CKTOTAL, CKMB, CKMBINDEX, TROPONINI,  in the last 168 hours  BNP (last 3 results)  Recent Labs  10/09/13 1204 11/19/13 1434 03/27/14 1045  PROBNP 189.7* 388.3* 5198.0*   CBG: No results found for this basename: GLUCAP,  in the last 168 hours  Radiological Exams on Admission: Dg  Chest 2 View (if Patient Has Fever And/or Copd)  03/27/2014   CLINICAL DATA:  Short red common coughing congestion since Friday  EXAM: CHEST  2 VIEW  COMPARISON:  Prior chest x-ray 11/19/2013  FINDINGS: Is stable cardiomegaly. Mediastinal contours are within normal limits. There is pulmonary vascular congestion without overt edema. No focal airspace consolidation, pleural effusion or pneumothorax. Stable remote healed left-sided rib fracture. No acute osseous abnormality.  IMPRESSION: 1. Cardiomegaly and pulmonary vascular congestion without overt edema.   Electronically Signed   By:  Jacqulynn Cadet M.D.   On: 03/27/2014 12:08    EKG: Independently reviewed.  Assessment/Plan Active Problems:   COPD exacerbation   Bilateral leg edema   Tobacco abuse   Respiratory failure with hypoxia   Hypoxic respiratory failure - with new oxygen needs, likely related to #2 and #3 COPD, with exacerbation - patient not formally diagnosed as he does not have a regular PCP, however this is very likely. Will need PFTs outside an exacerbation episode.  LE edema and elevated BNP - CXR with cardiomegaly, I am a bit concerned that he has a component of heart failure. Obtain a 2D echo. Lasix 40 mg x 1.  Tobacco abuse - patient not willing to quit, in fact asks whether he could walk out to have a cigarette.   Diet: heart healthy Fluids: DVT Prophylaxis:  Code Status: Full  Family Communication: d/w patient  Disposition Plan: inpatient  Time spent: 50  This note has been created with Surveyor, quantity. Any transcriptional errors are unintentional.   Costin M. Cruzita Lederer, MD [facility] Pager [phone]  If 7PM-7AM, please contact night-coverage [email or web address] Password [facility] 03/27/2014, 2:53 PM

## 2014-03-27 NOTE — Progress Notes (Signed)
Pt came from ED & has been on 4L 02 via [place] ,unable to wean from 4L per report. Pt not in any respiratory distress, walked from stretcher to room & BR on RA. Pt fingers are very cold & was obtaining inaccurate reading of 02 sat=78-84% . Placed the probe reading on pt's forehead with 02 sat of 99-100% on RA.

## 2014-03-27 NOTE — ED Notes (Signed)
Respiratory called

## 2014-03-27 NOTE — ED Notes (Signed)
Pt c/o SOB since Thursday, has been off and on since then. Denies pain but c/o lightheadedness and dizziness.

## 2014-03-27 NOTE — ED Notes (Signed)
Respiratory called for [place].  

## 2014-03-28 DIAGNOSIS — I369 Nonrheumatic tricuspid valve disorder, unspecified: Secondary | ICD-10-CM

## 2014-03-28 DIAGNOSIS — J9600 Acute respiratory failure, unspecified whether with hypoxia or hypercapnia: Secondary | ICD-10-CM

## 2014-03-28 DIAGNOSIS — F17200 Nicotine dependence, unspecified, uncomplicated: Secondary | ICD-10-CM

## 2014-03-28 DIAGNOSIS — J441 Chronic obstructive pulmonary disease with (acute) exacerbation: Secondary | ICD-10-CM

## 2014-03-28 DIAGNOSIS — R609 Edema, unspecified: Secondary | ICD-10-CM

## 2014-03-28 LAB — COMPREHENSIVE METABOLIC PANEL
ALK PHOS: 107 U/L (ref 39–117)
ALT: 18 U/L (ref 0–53)
AST: 22 U/L (ref 0–37)
Albumin: 3.3 g/dL — ABNORMAL LOW (ref 3.5–5.2)
BUN: 17 mg/dL (ref 6–23)
CALCIUM: 8.6 mg/dL (ref 8.4–10.5)
CO2: 35 mEq/L — ABNORMAL HIGH (ref 19–32)
Chloride: 93 mEq/L — ABNORMAL LOW (ref 96–112)
Creatinine, Ser: 1.07 mg/dL (ref 0.50–1.35)
GFR calc non Af Amer: 79 mL/min — ABNORMAL LOW (ref >90)
Glucose, Bld: 99 mg/dL (ref 70–99)
Potassium: 4.1 mEq/L (ref 3.7–5.3)
Sodium: 136 mEq/L — ABNORMAL LOW (ref 137–147)
TOTAL PROTEIN: 7 g/dL (ref 6.0–8.3)
Total Bilirubin: 0.6 mg/dL (ref 0.3–1.2)

## 2014-03-28 LAB — CBC
HCT: 49.3 % (ref 39.0–52.0)
Hemoglobin: 15.9 g/dL (ref 13.0–17.0)
MCH: 31.1 pg (ref 26.0–34.0)
MCHC: 32.3 g/dL (ref 30.0–36.0)
MCV: 96.5 fL (ref 78.0–100.0)
Platelets: 246 10*3/uL (ref 150–400)
RBC: 5.11 MIL/uL (ref 4.22–5.81)
RDW: 14.9 % (ref 11.5–15.5)
WBC: 11.2 10*3/uL — AB (ref 4.0–10.5)

## 2014-03-28 LAB — PRO B NATRIURETIC PEPTIDE: Pro B Natriuretic peptide (BNP): 4582 pg/mL — ABNORMAL HIGH (ref 0–125)

## 2014-03-28 LAB — TROPONIN I: Troponin I: <0.3 ng/mL (ref <0.30)

## 2014-03-28 MED ORDER — IPRATROPIUM-ALBUTEROL 0.5-2.5 (3) MG/3ML IN SOLN
3.0000 mL | Freq: Four times a day (QID) | RESPIRATORY_TRACT | Status: DC
  Administered 2014-03-28 – 2014-03-29 (×6): 3 mL via RESPIRATORY_TRACT
  Filled 2014-03-28 (×6): qty 3

## 2014-03-28 MED ORDER — ZOLPIDEM TARTRATE 5 MG PO TABS
5.0000 mg | ORAL_TABLET | Freq: Once | ORAL | Status: AC
  Administered 2014-03-28: 5 mg via ORAL
  Filled 2014-03-28: qty 1

## 2014-03-28 MED ORDER — FUROSEMIDE 10 MG/ML IJ SOLN
20.0000 mg | Freq: Two times a day (BID) | INTRAMUSCULAR | Status: DC
  Administered 2014-03-28 – 2014-03-29 (×3): 20 mg via INTRAVENOUS
  Filled 2014-03-28 (×4): qty 2

## 2014-03-28 NOTE — Care Management Note (Addendum)
    Page 1 of 1   03/29/2014     12:33:40 PM CARE MANAGEMENT NOTE 03/29/2014  Patient:  Ricardo Burch, Ricardo Burch   Account Number:  [id number]  Date Initiated:  03/28/2014  Documentation initiated by:  Dessa Phi  Subjective/Objective Assessment:   53 Y/O M ADMITTED W/COPD.     Action/Plan:   FROM HOME.   Anticipated DC Date:  03/29/2014   Anticipated DC Plan:  [name]  CM consult  [facility]      Choice offered to / List presented to:             Status of service:  Completed, signed off Medicare Important Message given?   (If response is "NO", the following Medicare IM given date fields will be blank) Date Medicare IM given:   Date Additional Medicare IM given:    Discharge Disposition:  HOME/SELF CARE  Per UR Regulation:  Reviewed for med. necessity/level of care/duration of stay  If discussed at Long Length of Stay Meetings, dates discussed:    Comments:  03/29/14 Ricardo Guallpa RN,BSN NCM [place] CHOOSES NOT TO GET IT [place] [facility] $4 MED LIST.[facility] EMAILED INFO FOR PCP APPT,SINCE UNABLE TO GET THROUGH ON PHONE(OUR PROCESS-EMAIL-THEY WILL CONTACT PATIENT OR I TOLD PATIENT TO WALK IN IF NO CALL BACK FROM [facility] BY 04/04/14.PATIENT VOICED UNDERSTANDING.MD UPDATED.  03/28/14 Ricardo Mcafee RN,BSN NCM [phone]

## 2014-03-28 NOTE — Progress Notes (Signed)
Echocardiogram 2D Echocardiogram has been performed.  Ricardo Burch 03/28/2014, 9:34 AM

## 2014-03-28 NOTE — Progress Notes (Signed)
[facility] PROGRESS NOTE  Ricardo Burch ZSW:[id number] DOB: 10/31/1961 DOA: 03/27/2014 PCP: No PCP Per Patient  Assessment/Plan: Acute Hypoxic resp failure -due to COPD/CHF  COPD,exacerbation - continue nebs, levaquin, prednisone taper -improving -needs PFts as outpt  Suspected CHF - Continue IV lasix today -suspected Diastolic -I/Os, weights -BNP in am   Tobacco abuse -counseled  -nicotine patch   DVT Prophylaxis: lovenox  Code Status: Full  Family Communication: d/w patient  Disposition Plan: home hopefully tomorrow  HPI/Subjective: Breathing better  Objective: Filed Vitals:   03/28/14 0455  BP: 138/92  Pulse: 90  Temp: 98.2 F (36.8 C)  Resp:     Intake/Output Summary (Last 24 hours) at 03/28/14 1019 Last data filed at 03/28/14 0900  Gross per 24 hour  Intake   1083 ml  Output      0 ml  Net   1083 ml   Filed Weights   03/27/14 1505  Weight: 102.05 kg (224 lb 15.7 oz)    Exam:   General:  AAOx3, no distress  Cardiovascular: S1S2/RRR  Respiratory: poor air movement B/L  Abdomen: soft, Nt, BS present  Musculoskeletal: trace edema    Data Reviewed: Basic Metabolic Panel:  Recent Labs Lab 03/27/14 1045 03/28/14 0302  NA 136* 136*  K 4.3 4.1  CL 94* 93*  CO2 34* 35*  GLUCOSE 103* 99  BUN 25* 17  CREATININE 1.16 1.07  CALCIUM 8.6 8.6   Liver Function Tests:  Recent Labs Lab 03/28/14 0302  AST 22  ALT 18  ALKPHOS 107  BILITOT 0.6  PROT 7.0  ALBUMIN 3.3*   No results found for this basename: LIPASE, AMYLASE,  in the last 168 hours No results found for this basename: AMMONIA,  in the last 168 hours CBC:  Recent Labs Lab 03/27/14 1045 03/28/14 0302  WBC 9.5 11.2*  HGB 16.4 15.9  HCT 50.4 49.3  MCV 96.7 96.5  PLT 280 246   Cardiac Enzymes:  Recent Labs Lab 03/27/14 1641 03/27/14 2248 03/28/14 0302  TROPONINI <0.30 <0.30 <0.30   BNP (last 3 results)  Recent Labs  11/19/13 1434 03/27/14 1045  03/28/14 0311  PROBNP 388.3* 5198.0* 4582.0*   CBG: No results found for this basename: GLUCAP,  in the last 168 hours  No results found for this or any previous visit (from the past 240 hour(s)).   Studies: Dg Chest 2 View (if Patient Has Fever And/or Copd)  03/27/2014   CLINICAL DATA:  Short red common coughing congestion since Friday  EXAM: CHEST  2 VIEW  COMPARISON:  Prior chest x-ray 11/19/2013  FINDINGS: Is stable cardiomegaly. Mediastinal contours are within normal limits. There is pulmonary vascular congestion without overt edema. No focal airspace consolidation, pleural effusion or pneumothorax. Stable remote healed left-sided rib fracture. No acute osseous abnormality.  IMPRESSION: 1. Cardiomegaly and pulmonary vascular congestion without overt edema.   Electronically Signed   By: Jacqulynn Cadet M.D.   On: 03/27/2014 12:08    Scheduled Meds: . aspirin EC  81 mg Oral Daily  . enoxaparin (LOVENOX) injection  40 mg Subcutaneous Q24H  . furosemide  20 mg Intravenous Q12H  . ipratropium-albuterol  3 mL Nebulization Q6H WA  . levofloxacin  500 mg Oral Daily  . nicotine  21 mg Transdermal Daily  . predniSONE  40 mg Oral Q breakfast  . sodium chloride  3 mL Intravenous Q12H   Continuous Infusions:  Antibiotics Given (last 72 hours)   Date/Time Action Medication Dose  03/27/14 1503 Given   levofloxacin (LEVAQUIN) tablet 500 mg 500 mg   03/28/14 4562 Given   levofloxacin (LEVAQUIN) tablet 500 mg 500 mg      Active Problems:   COPD exacerbation   Bilateral leg edema   Tobacco abuse   Respiratory failure with hypoxia    Time spent: 39min    [facility] Pager [phone]. If 7PM-7AM, please contact night-coverage at [email or web address], password [facility] 03/28/2014, 10:19 AM  LOS: 1 day

## 2014-03-29 DIAGNOSIS — I509 Heart failure, unspecified: Secondary | ICD-10-CM

## 2014-03-29 DIAGNOSIS — I5031 Acute diastolic (congestive) heart failure: Principal | ICD-10-CM

## 2014-03-29 LAB — BASIC METABOLIC PANEL
BUN: 18 mg/dL (ref 6–23)
CO2: 37 mEq/L — ABNORMAL HIGH (ref 19–32)
Calcium: 9 mg/dL (ref 8.4–10.5)
Chloride: 95 mEq/L — ABNORMAL LOW (ref 96–112)
Creatinine, Ser: 1.14 mg/dL (ref 0.50–1.35)
GFR calc Af Amer: 85 mL/min — ABNORMAL LOW (ref >90)
GFR, EST NON AFRICAN AMERICAN: 73 mL/min — AB (ref >90)
GLUCOSE: 88 mg/dL (ref 70–99)
POTASSIUM: 3.7 meq/L (ref 3.7–5.3)
SODIUM: 142 meq/L (ref 137–147)

## 2014-03-29 MED ORDER — BUDESONIDE-FORMOTEROL FUMARATE 80-4.5 MCG/ACT IN AERO
2.0000 | INHALATION_SPRAY | Freq: Two times a day (BID) | RESPIRATORY_TRACT | Status: DC
Start: 2014-03-29 — End: 2014-04-20

## 2014-03-29 MED ORDER — CEFPODOXIME PROXETIL 200 MG PO TABS: 200.0000 mg | ORAL_TABLET | Freq: Two times a day (BID) | ORAL | Status: DC

## 2014-03-29 MED ORDER — PREDNISONE 20 MG PO TABS: 20.0000 mg | ORAL_TABLET | Freq: Every day | ORAL | Status: DC

## 2014-03-29 MED ORDER — FUROSEMIDE 20 MG PO TABS: 20.0000 mg | ORAL_TABLET | Freq: Every day | ORAL | Status: DC

## 2014-03-29 MED ORDER — ALBUTEROL SULFATE HFA 108 (90 BASE) MCG/ACT IN AERS: 2.0000 | INHALATION_SPRAY | Freq: Four times a day (QID) | RESPIRATORY_TRACT | Status: DC | PRN

## 2014-03-29 NOTE — Discharge Summary (Signed)
Physician Discharge Summary  Ricardo Burch WIO:[id number] DOB: [birth year] DOA: 03/27/2014  PCP: No PCP Per Patient  Admit date: 03/27/2014 Discharge date: 03/29/2014  Time spent: 45 minutes  Recommendations for Outpatient Follow-up:  1. Per Case manager, [facility], [facility] will call him with Follow up  Discharge Diagnoses:    Acute Diastolic CHF   COPD exacerbation   Bilateral leg edema   Tobacco abuse   Respiratory failure with hypoxia   Discharge Condition: stable  Diet recommendation: low sdoium  Filed Weights   03/27/14 1505 03/29/14 0515  Weight: 102.05 kg (224 lb 15.7 oz) 100.789 kg (222 lb 3.2 oz)    History of present illness:  Ricardo Burch is a 53 y.o. male has a past medical history significant for presumed COPD but not getting regular medical care, tobacco abuse, presents to the ED with a chief complaint of shortness of breath and wheezing for the past 24-48 hours. He is not on any regular medications for his COPD and only takes a full dose aspirin, self-prescribed, to help his health. He also has had worsening cough and sputum production for the past day or so. He has no chest pain. He also describes progressive shortness of breath going up stairs or walking of few months duration. In the ED, CXR with cardiomegaly and pulmonary vascular congestion, elevated BNP. Wheezing on initial evaluation by EDP, improved after nebulizers and found to have new oxygen requirements. Denies fever/chills, denies nausea/vomiting/diarrhea.    Hospital Course:  Acute Hypoxic resp failure  -due to COPD/CHF  -improved, weaned off O2  COPD,exacerbation  -treated with nebs, levaquin, prednisone taper  -improved -needs PFts as outpt   Acute Diastolic CHF  - ECHO with LVH, grade 1 diastolic dysfunction - improved with IV lasix -being discharged on low dose PO lasix -educated about diet/salt restriction  Tobacco abuse  -counseled  -nicotine patch     Procedures: ECHO:Study Conclusions - Left ventricle: The cavity size was normal. Wall thickness was increased in a pattern of mild LVH. Systolic function was normal. The estimated ejection fraction was in the range of 50% to 55%. Doppler parameters are consistent with abnormal left ventricular relaxation (grade 1 diastolic dysfunction). - Ventricular septum: Septal motion showed abnormal function and dyssynergy. The contour showed systolic flattening, consistent with RV pressure overload. These changes are consistent with intraventricular conduction delay. - Aortic valve: Mildly calcified annulus. Trileaflet. - Right ventricle: The cavity size was mildly to moderately dilated. Systolic function was mildly reduced. - Right atrium: The atrium was mildly dilated. - Tricuspid valve: Mildly thickened leaflets. There was mild regurgitation. RVSP 62 mmHg, consistent with severely elevated pulmonary pressures. - Inferior vena cava: The vessel was dilated. The respirophasic diameter changes were blunted (< 50%), consistent with elevated central venous pressure.   Discharge Exam: Filed Vitals:   03/29/14 0515  BP: 136/85  Pulse: 89  Temp: 98.6 F (37 C)  Resp: 16    General: AAOx3 Cardiovascular: S1S2/RRR Respiratory: CTAB  Discharge Instructions You were cared for by a hospitalist during your hospital stay. If you have any questions about your discharge medications or the care you received while you were in the hospital after you are discharged, you can call the unit and asked to speak with the hospitalist on call if the hospitalist that took care of you is not available. Once you are discharged, your primary care physician will handle any further medical issues. Please note that NO REFILLS for any discharge medications  will be authorized once you are discharged, as it is imperative that you return to your primary care physician (or establish a relationship with a primary care  physician if you do not have one) for your aftercare needs so that they can reassess your need for medications and monitor your lab values.     Medication List         albuterol 108 (90 BASE) MCG/ACT inhaler  Commonly known as:  PROVENTIL HFA;VENTOLIN HFA  Inhale 2 puffs into the lungs every 6 (six) hours as needed for wheezing or shortness of breath.     aspirin 325 MG EC tablet  Take 325 mg by mouth daily.     budesonide-formoterol 80-4.5 MCG/ACT inhaler  Commonly known as:  SYMBICORT  Inhale 2 puffs into the lungs 2 (two) times daily.     cefpodoxime 200 MG tablet  Commonly known as:  VANTIN  Take 1 tablet (200 mg total) by mouth 2 (two) times daily. For 3 days     furosemide 20 MG tablet  Commonly known as:  LASIX  Take 1 tablet (20 mg total) by mouth daily.     predniSONE 20 MG tablet  Commonly known as:  DELTASONE  Take 1-2 tablets (20-40 mg total) by mouth daily with breakfast. Take 40mg  for 2days then 20mg  for 2days then STOP       No Known Allergies    The results of significant diagnostics from this hospitalization (including imaging, microbiology, ancillary and laboratory) are listed below for reference.    Significant Diagnostic Studies: Dg Chest 2 View (if Patient Has Fever And/or Copd)  03/27/2014   CLINICAL DATA:  Short red common coughing congestion since Friday  EXAM: CHEST  2 VIEW  COMPARISON:  Prior chest x-ray 11/19/2013  FINDINGS: Is stable cardiomegaly. Mediastinal contours are within normal limits. There is pulmonary vascular congestion without overt edema. No focal airspace consolidation, pleural effusion or pneumothorax. Stable remote healed left-sided rib fracture. No acute osseous abnormality.  IMPRESSION: 1. Cardiomegaly and pulmonary vascular congestion without overt edema.   Electronically Signed   By: Jacqulynn Cadet M.D.   On: 03/27/2014 12:08    Microbiology: No results found for this or any previous visit (from the past 240 hour(s)).    Labs: Basic Metabolic Panel:  Recent Labs Lab 03/27/14 1045 03/28/14 0302 03/29/14 0340  NA 136* 136* 142  K 4.3 4.1 3.7  CL 94* 93* 95*  CO2 34* 35* 37*  GLUCOSE 103* 99 88  BUN 25* 17 18  CREATININE 1.16 1.07 1.14  CALCIUM 8.6 8.6 9.0   Liver Function Tests:  Recent Labs Lab 03/28/14 0302  AST 22  ALT 18  ALKPHOS 107  BILITOT 0.6  PROT 7.0  ALBUMIN 3.3*   No results found for this basename: LIPASE, AMYLASE,  in the last 168 hours No results found for this basename: AMMONIA,  in the last 168 hours CBC:  Recent Labs Lab 03/27/14 1045 03/28/14 0302  WBC 9.5 11.2*  HGB 16.4 15.9  HCT 50.4 49.3  MCV 96.7 96.5  PLT 280 246   Cardiac Enzymes:  Recent Labs Lab 03/27/14 1641 03/27/14 2248 03/28/14 0302  TROPONINI <0.30 <0.30 <0.30   BNP: BNP (last 3 results)  Recent Labs  11/19/13 1434 03/27/14 1045 03/28/14 0311  PROBNP 388.3* 5198.0* 4582.0*   CBG: No results found for this basename: GLUCAP,  in the last 168 hours     Signed:  [facility] 03/29/2014,  12:25 PM

## 2014-04-20 ENCOUNTER — Ambulatory Visit: Payer: Self-pay | Attending: Internal Medicine

## 2014-04-20 ENCOUNTER — Encounter: Payer: Self-pay | Admitting: Internal Medicine

## 2014-04-20 ENCOUNTER — Ambulatory Visit: Payer: Self-pay | Attending: Internal Medicine | Admitting: Internal Medicine

## 2014-04-20 VITALS — BP 121/86 | HR 91 | Temp 98.6°F | Resp 14 | Ht 67.0 in | Wt 241.0 lb

## 2014-04-20 DIAGNOSIS — I10 Essential (primary) hypertension: Secondary | ICD-10-CM | POA: Insufficient documentation

## 2014-04-20 DIAGNOSIS — F17200 Nicotine dependence, unspecified, uncomplicated: Secondary | ICD-10-CM | POA: Insufficient documentation

## 2014-04-20 DIAGNOSIS — I5031 Acute diastolic (congestive) heart failure: Secondary | ICD-10-CM | POA: Insufficient documentation

## 2014-04-20 DIAGNOSIS — J4489 Other specified chronic obstructive pulmonary disease: Secondary | ICD-10-CM | POA: Insufficient documentation

## 2014-04-20 DIAGNOSIS — J449 Chronic obstructive pulmonary disease, unspecified: Secondary | ICD-10-CM | POA: Insufficient documentation

## 2014-04-20 DIAGNOSIS — I509 Heart failure, unspecified: Secondary | ICD-10-CM

## 2014-04-20 DIAGNOSIS — R609 Edema, unspecified: Secondary | ICD-10-CM | POA: Insufficient documentation

## 2014-04-20 MED ORDER — BUDESONIDE-FORMOTEROL FUMARATE 80-4.5 MCG/ACT IN AERO: 2.0000 | INHALATION_SPRAY | Freq: Two times a day (BID) | RESPIRATORY_TRACT | Status: DC

## 2014-04-20 MED ORDER — ALBUTEROL SULFATE HFA 108 (90 BASE) MCG/ACT IN AERS: 2.0000 | INHALATION_SPRAY | Freq: Four times a day (QID) | RESPIRATORY_TRACT | Status: DC | PRN

## 2014-04-20 MED ORDER — FUROSEMIDE 20 MG PO TABS: 20.0000 mg | ORAL_TABLET | Freq: Every day | ORAL | Status: DC

## 2014-04-20 NOTE — Progress Notes (Signed)
Pt is here to establish care. Pt was in the ED with COPD, CHF and leg swelling After sitting for long periods of time his knees feel sore and stiff.

## 2014-04-20 NOTE — Patient Instructions (Signed)
2 Gram Low Sodium Diet A 2 gram sodium diet restricts the amount of sodium in the diet to no more than 2 g or 2000 mg daily. Limiting the amount of sodium is often used to help lower blood pressure. It is important if you have heart, liver, or kidney problems. Many foods contain sodium for flavor and sometimes as a preservative. When the amount of sodium in a diet needs to be low, it is important to know what to look for when choosing foods and drinks. The following includes some information and guidelines to help make it easier for you to adapt to a low sodium diet. QUICK TIPS  Do not add salt to food.  Avoid convenience items and fast food.  Choose unsalted snack foods.  Buy lower sodium products, often labeled as "lower sodium" or "no salt added."  Check food labels to learn how much sodium is in 1 serving.  When eating at a restaurant, ask that your food be prepared with less salt or none, if possible. READING FOOD LABELS FOR SODIUM INFORMATION The nutrition facts label is a good place to find how much sodium is in foods. Look for products with no more than 500 to 600 mg of sodium per meal and no more than 150 mg per serving. Remember that 2 g = 2000 mg. The food label may also list foods as:  Sodium-free: Less than 5 mg in a serving.  Very low sodium: 35 mg or less in a serving.  Low-sodium: 140 mg or less in a serving.  Light in sodium: 50% less sodium in a serving. For example, if a food that usually has 300 mg of sodium is changed to become light in sodium, it will have 150 mg of sodium.  Reduced sodium: 25% less sodium in a serving. For example, if a food that usually has 400 mg of sodium is changed to reduced sodium, it will have 300 mg of sodium. CHOOSING FOODS Grains  Avoid: Salted crackers and snack items. Some cereals, including instant hot cereals. Bread stuffing and biscuit mixes. Seasoned rice or pasta mixes.  Choose: Unsalted snack items. Low-sodium cereals, oats,  puffed wheat and rice, shredded wheat. English muffins and bread. Pasta. Meats  Avoid: Salted, canned, smoked, spiced, pickled meats, including fish and poultry. Bacon, ham, sausage, cold cuts, hot dogs, anchovies.  Choose: Low-sodium canned tuna and salmon. Fresh or frozen meat, poultry, and fish. Dairy  Avoid: Processed cheese and spreads. Cottage cheese. Buttermilk and condensed milk. Regular cheese.  Choose: Milk. Low-sodium cottage cheese. Yogurt. Sour cream. Low-sodium cheese. Fruits and Vegetables  Avoid: Regular canned vegetables. Regular canned tomato sauce and paste. Frozen vegetables in sauces. Olives. Pickles. Relishes. Sauerkraut.  Choose: Low-sodium canned vegetables. Low-sodium tomato sauce and paste. Frozen or fresh vegetables. Fresh and frozen fruit. Condiments  Avoid: Canned and packaged gravies. Worcestershire sauce. Tartar sauce. Barbecue sauce. Soy sauce. Steak sauce. Ketchup. Onion, garlic, and table salt. Meat flavorings and tenderizers.  Choose: Fresh and dried herbs and spices. Low-sodium varieties of mustard and ketchup. Lemon juice. Tabasco sauce. Horseradish. SAMPLE 2 GRAM SODIUM MEAL PLAN Breakfast / Sodium (mg)  1 cup low-fat milk / 143 mg  2 slices whole-wheat toast / 270 mg  1 tbs heart-healthy margarine / 153 mg  1 hard-boiled egg / 139 mg  1 Carden orange / 0 mg Lunch / Sodium (mg)  1 cup raw carrots / 76 mg   cup hummus / 298 mg  1 cup low-fat milk /   143 mg   cup red grapes / 2 mg  1 whole-wheat pita bread / 356 mg Dinner / Sodium (mg)  1 cup whole-wheat pasta / 2 mg  1 cup low-sodium tomato sauce / 73 mg  3 oz lean ground beef / 57 mg  1 Hosie side salad (1 cup raw spinach leaves,  cup cucumber,  cup yellow bell pepper) with 1 tsp olive oil and 1 tsp red wine vinegar / 25 mg Snack / Sodium (mg)  1 container low-fat vanilla yogurt / 107 mg  3 graham cracker squares / 127 mg Nutrient Analysis  Calories: 2033  Protein:  77 g  Carbohydrate: 282 g  Fat: 72 g  Sodium: 1971 mg Document Released: 10/21/2005 Document Revised: 01/13/2012 Document Reviewed: 01/22/2010 [employer] Patient Information 2014 [employer].  

## 2014-04-20 NOTE — Progress Notes (Signed)
Patient ID: Ricardo Burch, male   DOB: 04-08-1961, 53 y.o.   MRN: [id number]   CC: Hospital followup  HPI: Patient is 53 year old male with recent diagnosis of diastolic congestive heart failure, COPD, hypertension, presents to clinic for followup. He reports medical compliance, reports feeling well and denies chest pain or shortness of breath. He denies lower extremity swelling, no orthopnea. He would like [place] to explain several medical terminology's that he was provided upon discharge. He also would like to discuss 2 g sodium diet in more detail.  No Known Allergies Past Medical History  Diagnosis Date  . Eczema   . COPD (chronic obstructive pulmonary disease)    Current Outpatient Prescriptions on File Prior to Visit  Medication Sig Dispense Refill  . aspirin 325 MG EC tablet Take 325 mg by mouth daily.      . cefpodoxime (VANTIN) 200 MG tablet Take 1 tablet (200 mg total) by mouth 2 (two) times daily. For 3 days  6 tablet  0  . predniSONE (DELTASONE) 20 MG tablet Take 1-2 tablets (20-40 mg total) by mouth daily with breakfast. Take 40mg  for 2days then 20mg  for 2days then STOP  6 tablet  0   No current facility-administered medications on file prior to visit.   No significant past medical history and family members History   Social History  . Marital Status: Single    Spouse Name: N/A    Number of Children: N/A  . Years of Education: N/A   Occupational History  . Not on file.   Social History Main Topics  . Smoking status: Current Every Day Smoker -- 0.50 packs/day for 30 years    Types: Cigarettes  . Smokeless tobacco: Not on file  . Alcohol Use: Yes     Comment: occassionally  . Drug Use: No  . Sexual Activity: Not on file   Other Topics Concern  . Not on file   Social History Narrative  . No narrative on file    Review of Systems  Constitutional: Negative for fever, chills, diaphoresis, activity change, appetite change and fatigue.  HENT: Negative for ear pain,  nosebleeds, congestion, facial swelling, rhinorrhea, neck pain, neck stiffness and ear discharge.   Eyes: Negative for pain, discharge, redness, itching and visual disturbance.  Respiratory: Negative for cough, choking, chest tightness, shortness of breath, wheezing and stridor.   Cardiovascular: Negative for chest pain, palpitations and leg swelling.  Gastrointestinal: Negative for abdominal distention.  Genitourinary: Negative for dysuria, urgency, frequency, hematuria, flank pain, decreased urine volume, difficulty urinating and dyspareunia.  Musculoskeletal: Negative for back pain, joint swelling, arthralgias and gait problem.  Neurological: Negative for dizziness, tremors, seizures, syncope, facial asymmetry, speech difficulty, weakness, light-headedness, numbness and headaches.  Hematological: Negative for adenopathy. Does not bruise/bleed easily.  Psychiatric/Behavioral: Negative for hallucinations, behavioral problems, confusion, dysphoric mood, decreased concentration and agitation.    Objective:   Filed Vitals:   04/20/14 1435  BP: 121/86  Pulse: 91  Temp: 98.6 F (37 C)  Resp: 14    Physical Exam  Constitutional: Appears well-developed and well-nourished. No distress.  CVS: RRR, S1/S2 +, no murmurs, no gallops, no carotid bruit.  Pulmonary: Effort and breath sounds normal, no stridor, rhonchi, wheezes, rales.  Abdominal: Soft. BS +,  no distension, tenderness, rebound or guarding.    Lab Results  Component Value Date   WBC 11.2* 03/28/2014   HGB 15.9 03/28/2014   HCT 49.3 03/28/2014   MCV 96.5 03/28/2014   PLT 246 03/28/2014  Lab Results  Component Value Date   CREATININE 1.14 03/29/2014   BUN 18 03/29/2014   NA 142 03/29/2014   K 3.7 03/29/2014   CL 95* 03/29/2014   CO2 37* 03/29/2014    Lab Results  Component Value Date   HGBA1C 5.8* 03/27/2014   Lipid Panel  No results found for this basename: chol, trig, hdl, cholhdl, vldl, ldlcalc       Assessment and  plan:   Patient Active Problem List   Diagnosis Date Noted  . Acute diastolic CHF (congestive heart failure) - We have discussed meaning of diastolic CHF an impact on daily activities such as walking, sleeping, exercising  - We have also discussed importance of regular weight monitoring and riding weight down  - Patient advised to call us if he notices increase in weight over 3 pounds  - We have discussed use as medicine Lasix for congestive heart failure and patient verbalizes appreciation and understanding  - We have spent significant amount of time discussing 2 g sodium diet and I educated patient on reading food labels when going to grocery shopping  03/29/2014  . Bilateral leg edema - Appears to be resolved, no swelling on exam this afternoon  03/27/2014  . Tobacco abuse - Patient reports he is cutting down on smoking, has cut down to half a pack a day from one pack a day  - I offered prescription for nicotine patches and patient wants to try on his own  - I also offered patient our support in case he needs it  03/27/2014  . COPD exacerbation - We have discussed meaning of COPD and what he can do to prevent progression of the illness  11/19/2013

## 2014-04-27 ENCOUNTER — Other Ambulatory Visit: Payer: Self-pay | Admitting: Internal Medicine

## 2014-04-27 MED ORDER — BUDESONIDE-FORMOTEROL FUMARATE 80-4.5 MCG/ACT IN AERO: 2.0000 | INHALATION_SPRAY | Freq: Two times a day (BID) | RESPIRATORY_TRACT | Status: DC

## 2014-04-27 MED ORDER — ALBUTEROL SULFATE HFA 108 (90 BASE) MCG/ACT IN AERS: 2.0000 | INHALATION_SPRAY | Freq: Four times a day (QID) | RESPIRATORY_TRACT | Status: DC | PRN

## 2014-08-23 ENCOUNTER — Encounter: Payer: Self-pay | Admitting: Family Medicine

## 2014-08-23 ENCOUNTER — Ambulatory Visit: Payer: Self-pay | Attending: Family Medicine | Admitting: Family Medicine

## 2014-08-23 VITALS — BP 124/82 | HR 103 | Temp 98.4°F | Resp 18 | Ht 67.0 in | Wt 237.0 lb

## 2014-08-23 DIAGNOSIS — I5032 Chronic diastolic (congestive) heart failure: Secondary | ICD-10-CM

## 2014-08-23 DIAGNOSIS — Z6837 Body mass index (BMI) 37.0-37.9, adult: Secondary | ICD-10-CM | POA: Insufficient documentation

## 2014-08-23 DIAGNOSIS — J069 Acute upper respiratory infection, unspecified: Secondary | ICD-10-CM | POA: Insufficient documentation

## 2014-08-23 DIAGNOSIS — J42 Unspecified chronic bronchitis: Secondary | ICD-10-CM

## 2014-08-23 DIAGNOSIS — Z720 Tobacco use: Secondary | ICD-10-CM | POA: Insufficient documentation

## 2014-08-23 DIAGNOSIS — L309 Dermatitis, unspecified: Secondary | ICD-10-CM | POA: Insufficient documentation

## 2014-08-23 DIAGNOSIS — J449 Chronic obstructive pulmonary disease, unspecified: Secondary | ICD-10-CM | POA: Insufficient documentation

## 2014-08-23 DIAGNOSIS — Z760 Encounter for issue of repeat prescription: Secondary | ICD-10-CM | POA: Insufficient documentation

## 2014-08-23 DIAGNOSIS — M25561 Pain in right knee: Secondary | ICD-10-CM | POA: Insufficient documentation

## 2014-08-23 MED ORDER — TRIAMCINOLONE ACETONIDE 0.1 % EX CREA: 1.0000 "application " | TOPICAL_CREAM | Freq: Two times a day (BID) | CUTANEOUS | Status: DC

## 2014-08-23 MED ORDER — ALBUTEROL SULFATE HFA 108 (90 BASE) MCG/ACT IN AERS: 2.0000 | INHALATION_SPRAY | Freq: Four times a day (QID) | RESPIRATORY_TRACT | Status: DC | PRN

## 2014-08-23 MED ORDER — BUDESONIDE-FORMOTEROL FUMARATE 80-4.5 MCG/ACT IN AERO: 2.0000 | INHALATION_SPRAY | Freq: Two times a day (BID) | RESPIRATORY_TRACT | Status: DC

## 2014-08-23 MED ORDER — NICOTINE 14 MG/24HR TD PT24: 14.0000 mg | MEDICATED_PATCH | Freq: Every day | TRANSDERMAL | Status: DC

## 2014-08-23 MED ORDER — BUPROPION HCL ER (SR) 150 MG PO TB12: 150.0000 mg | ORAL_TABLET | Freq: Two times a day (BID) | ORAL | Status: DC

## 2014-08-23 NOTE — Progress Notes (Signed)
Complaining of rt leg pain Eczema on hands and arm Pt stated has productive cough since last night

## 2014-08-23 NOTE — Assessment & Plan Note (Signed)
A: smoker desires to quit P: wellbutrin per orders Nicotine patch recommended 14 mg daily x 2 weeks, then 7 mg daily for 4-6 weeks F/u in 4-6 weeks

## 2014-08-23 NOTE — Patient Instructions (Signed)
Ricardo Burch,  Thank you for coming in today. It was a pleasure meeting you. I look forward to being your primary doctor.   1. For eczema- topical triamcinolone    2. For smoking-  Smoking cessation support: smoking cessation hotline: 1-800-QUIT-NOW.  Smoking cessation classes are available through [facility]. Call [phone] or visit our website at [email or web address] Patch 14 mg daily for 2 weeks, then 7 mg daily for 4-6 weeks welbutrin 150 mg daily for 3 days, then 150 mg twice daily, take second dose before 6 pm to avoid trouble falling asleep. Set quit date 7 days after starting.   3. Knee pain: recommend tylenol  4. Cold symptoms: no COPD exacerbation. Continue inhalers. Recommend nasal saline.   F/u in 4- 6 weeks   Dr. Adrian Blackwater

## 2014-08-23 NOTE — Progress Notes (Signed)
   Subjective:    Patient ID: Ricardo Burch, male    DOB: [birth year], 53 y.o.   MRN: [id number] CC: establish care, eczema, productive cough, Rt leg pain  HPI  1. Scratchy throat: started two days ago. Associated with productive cough.  No fever. No chest pain or worsening SOB.   2. Eczema: since childhood. Located in hands, antecubital fossa and lower arms. Flare up are caused by certain food trigger and anxiety. Sometimes itchy. Not painful.   3. Rt knee pain: started one month ago. Pain is also located in Rt upper calf. No injury. Achy feeling. No swelling in knee and calf.   Soc hx:  Current smoker, desires to cut  Review of Systems As per HPI     Objective:   Physical Exam BP 124/82  Pulse 103  Temp(Src) 98.4 F (36.9 C) (Oral)  Resp 18  Ht 5\' 7"  (1.702 m)  Wt 237 lb (107.502 kg)  BMI 37.11 kg/m2  SpO2 91% General appearance: alert, cooperative and no distress HEENT: normal TMs, PERRLA, swollen nasal turbinates, slightly swollen tonsils w/o exudates  Lungs: clear to auscultation bilaterally Heart: regular rate and rhythm, S1, S2 normal, no murmur, click, rub or gallop Extremities: edema none. Rt knee w/o joint line swelling or tenderness.  Skin: thickened plaques finger, antecubital fossa and wrist bilaterally       Assessment & Plan:

## 2014-08-23 NOTE — Assessment & Plan Note (Signed)
A: chronic eczema untreated w/o secondary bacterial infection P: Avoid triggers Topical steroid per orders  F/u prn

## 2014-08-23 NOTE — Assessment & Plan Note (Signed)
A: recent onset w/o signs of infection or inflammatory arthropathy. No signs of DVT on exam P: Recommend OTC pain control with tylenol or NSAID Weight loss Return prn for worsening symptoms, plan for knee x-ray +/- intraarticular injection at f/u if patient amenable.

## 2014-08-23 NOTE — Assessment & Plan Note (Signed)
A: chronic COPD w/o exacerbation patient with URI P: refilled symbicort and albuterol Symptom management for URI symptoms including nasal saline recommended.

## 2014-08-24 LAB — COMPLETE METABOLIC PANEL WITH GFR
ALT: 17 U/L (ref 0–53)
AST: 19 U/L (ref 0–37)
Albumin: 4.1 g/dL (ref 3.5–5.2)
Alkaline Phosphatase: 88 U/L (ref 39–117)
BUN: 13 mg/dL (ref 6–23)
CALCIUM: 9.1 mg/dL (ref 8.4–10.5)
CHLORIDE: 102 meq/L (ref 96–112)
CO2: 30 mEq/L (ref 19–32)
CREATININE: 0.92 mg/dL (ref 0.50–1.35)
GFR, Est Non African American: >89 mL/min
Glucose, Bld: 85 mg/dL (ref 70–99)
Potassium: 4.5 mEq/L (ref 3.5–5.3)
SODIUM: 138 meq/L (ref 135–145)
Total Bilirubin: 1.2 mg/dL (ref 0.2–1.2)
Total Protein: 7.4 g/dL (ref 6.0–8.3)

## 2014-09-01 ENCOUNTER — Telehealth: Payer: Self-pay | Admitting: *Deleted

## 2014-09-01 NOTE — Telephone Encounter (Signed)
Unable to contact Pt, incorrect number

## 2014-09-01 NOTE — Telephone Encounter (Signed)
Message copied by Betti Cruz on Thu Sep 01, 2014 12:45 PM ------      Message from: Boykin Nearing      Created: Wed Aug 24, 2014  1:36 PM       Normal CMP ------

## 2015-01-06 ENCOUNTER — Other Ambulatory Visit: Payer: Self-pay | Admitting: Emergency Medicine

## 2015-01-06 MED ORDER — FUROSEMIDE 20 MG PO TABS: 20.0000 mg | ORAL_TABLET | Freq: Every day | ORAL | Status: DC

## 2015-02-13 ENCOUNTER — Other Ambulatory Visit: Payer: Self-pay | Admitting: Family Medicine

## 2015-02-24 ENCOUNTER — Other Ambulatory Visit: Payer: Self-pay | Admitting: Family Medicine

## 2015-03-01 ENCOUNTER — Other Ambulatory Visit: Payer: Self-pay | Admitting: Family Medicine

## 2015-03-01 MED ORDER — FUROSEMIDE 20 MG PO TABS: 20.0000 mg | ORAL_TABLET | Freq: Every day | ORAL | Status: DC

## 2015-05-26 ENCOUNTER — Encounter: Payer: Self-pay | Admitting: Family Medicine

## 2015-05-26 ENCOUNTER — Ambulatory Visit: Payer: 59 | Attending: Family Medicine | Admitting: Family Medicine

## 2015-05-26 VITALS — BP 135/77 | HR 91 | Temp 98.8°F | Resp 16 | Ht 67.0 in | Wt 251.0 lb

## 2015-05-26 DIAGNOSIS — M7989 Other specified soft tissue disorders: Secondary | ICD-10-CM | POA: Diagnosis not present

## 2015-05-26 DIAGNOSIS — Z114 Encounter for screening for human immunodeficiency virus [HIV]: Secondary | ICD-10-CM | POA: Diagnosis not present

## 2015-05-26 DIAGNOSIS — Z720 Tobacco use: Secondary | ICD-10-CM | POA: Insufficient documentation

## 2015-05-26 DIAGNOSIS — S5002XA Contusion of left elbow, initial encounter: Secondary | ICD-10-CM

## 2015-05-26 DIAGNOSIS — Z23 Encounter for immunization: Secondary | ICD-10-CM

## 2015-05-26 DIAGNOSIS — Z0000 Encounter for general adult medical examination without abnormal findings: Secondary | ICD-10-CM

## 2015-05-26 DIAGNOSIS — M25522 Pain in left elbow: Secondary | ICD-10-CM | POA: Diagnosis not present

## 2015-05-26 DIAGNOSIS — I5032 Chronic diastolic (congestive) heart failure: Secondary | ICD-10-CM

## 2015-05-26 LAB — BASIC METABOLIC PANEL
BUN: 10 mg/dL (ref 6–23)
CHLORIDE: 101 meq/L (ref 96–112)
CO2: 31 meq/L (ref 19–32)
Calcium: 9.2 mg/dL (ref 8.4–10.5)
Creat: 0.95 mg/dL (ref 0.50–1.35)
GLUCOSE: 96 mg/dL (ref 70–99)
Potassium: 4.3 mEq/L (ref 3.5–5.3)
Sodium: 140 mEq/L (ref 135–145)

## 2015-05-26 MED ORDER — NICOTINE 14 MG/24HR TD PT24: 14.0000 mg | MEDICATED_PATCH | Freq: Every day | TRANSDERMAL | Status: DC

## 2015-05-26 MED ORDER — NICOTINE 7 MG/24HR TD PT24: 7.0000 mg | MEDICATED_PATCH | Freq: Every day | TRANSDERMAL | Status: DC

## 2015-05-26 MED ORDER — MELOXICAM 15 MG PO TABS: 15.0000 mg | ORAL_TABLET | Freq: Every day | ORAL | Status: DC

## 2015-05-26 MED ORDER — FUROSEMIDE 20 MG PO TABS: 20.0000 mg | ORAL_TABLET | Freq: Every day | ORAL | Status: DC

## 2015-05-26 MED ORDER — NICOTINE 21 MG/24HR TD PT24
21.0000 mg | MEDICATED_PATCH | Freq: Every day | TRANSDERMAL | Status: DC
Start: 2015-05-26 — End: 2015-06-06

## 2015-05-26 NOTE — Progress Notes (Signed)
Complaing of swelling on ankles. pain on lt elbow due to injury 3 days ago  Tobacco user 1/2 pack per day

## 2015-05-26 NOTE — Assessment & Plan Note (Signed)
Screening HIV ordered  

## 2015-05-26 NOTE — Progress Notes (Signed)
   Subjective:    Patient ID: Ricardo Burch, male    DOB: [birth year], 54 y.o.   MRN: [id number] CC: leg swelling, L elbow pain  HPI 54 yo M with hx of diastolic CHF presents for f/u appt:  1. Leg swelling: x last week. No CP, SOB or orthopnea. Has ran out of lasix. Has gained 14 lbs over past 9 months. No leg pain.   2. Smoking: 1/2 PPD. Desires to quit. Started chantix in the past but stopped due to nausea. Interested in nicotine patch.   3. L elbow pain: following fall 3 days ago. No swelling. Pain is worse at night and with elbow flexion. Ibuprofen has helped a little with elbow pain.   Soc Hx: current smoker   Review of Systems  Constitutional: Negative for fever, chills, fatigue and unexpected weight change.  Eyes: Negative for visual disturbance.  Respiratory: Negative for cough and shortness of breath.   Cardiovascular: Positive for leg swelling. Negative for chest pain and palpitations.  Gastrointestinal: Negative for nausea, vomiting, abdominal pain, diarrhea, constipation and blood in stool.  Musculoskeletal: Positive for myalgias and arthralgias. Negative for joint swelling.  Skin: Positive for rash.      Objective:   Physical Exam BP 135/77 mmHg  Pulse 91  Temp(Src) 98.8 F (37.1 C)  Resp 16  Ht 5\' 7"  (1.702 m)  Wt 251 lb (113.853 kg)  BMI 39.30 kg/m2  SpO2 90%  Wt Readings from Last 3 Encounters:  05/26/15 251 lb (113.853 kg)  08/23/14 237 lb (107.502 kg)  04/20/14 241 lb (109.317 kg)  General appearance: alert, cooperative and no distress Lungs: clear to auscultation bilaterally Heart: regular rate and rhythm, S1, S2 normal, no murmur, click, rub or gallop Extremities: edema trace LE b/l w/o erythema or tenderness      Assessment & Plan:

## 2015-05-26 NOTE — Assessment & Plan Note (Signed)
Smoking: Smoking cessation support: smoking cessation hotline: 1-800-QUIT-NOW.  Smoking cessation classes are available through [facility]. Call [phone] or visit our website at [email or web address] Nicotine patch 21 mg for 6 weeks 14 mg for 2 weeks 7 mg for 2 weeks

## 2015-05-26 NOTE — Assessment & Plan Note (Signed)
L elbow pain: Due to fall with contusion Ice  mobic nightly for next 5 day

## 2015-05-26 NOTE — Assessment & Plan Note (Signed)
Diastolic CHF: Refilled lasix 20 mg once daily Checking BMP and chest x-ray  Low salt diet  Focus on weight loss

## 2015-05-26 NOTE — Patient Instructions (Addendum)
Ricardo Burch,  Thank you for coming in today  1. Diastolic CHF: Refilled lasix 20 mg once daily Checking BMP and chest x-ray  Low salt diet  Focus on weight loss   2. Smoking: Smoking cessation support: smoking cessation hotline: 1-800-QUIT-NOW.  Smoking cessation classes are available through [facility]. Call [phone] or visit our website at [email or web address] Nicotine patch 21 mg for 6 weeks 14 mg for 2 weeks 7 mg for 2 weeks  3. L elbow pain: Due to fall with contusion Ice  mobic nightly for next 5 days   4. Healthcare maintenance: Screening HIV added on today GI referral for colonoscopy   F/u in 2 months for diastolic CHF  Low-Sodium Eating Plan Sodium raises blood pressure and causes water to be held in the body. Getting less sodium from food will help lower your blood pressure, reduce any swelling, and protect your heart, liver, and kidneys. We get sodium by adding salt (sodium chloride) to food. Most of our sodium comes from canned, boxed, and frozen foods. Restaurant foods, fast foods, and pizza are also very high in sodium. Even if you take medicine to lower your blood pressure or to reduce fluid in your body, getting less sodium from your food is important. WHAT IS MY PLAN? Most people should limit their sodium intake to 2,300 mg a day. Your health care provider recommends that you limit your sodium intake to __________ a day.  WHAT DO I NEED TO KNOW ABOUT THIS EATING PLAN? For the low-sodium eating plan, you will follow these general guidelines:  Choose foods with a % Daily Value for sodium of less than 5% (as listed on the food label).   Use salt-free seasonings or herbs instead of table salt or sea salt.   Check with your health care provider or pharmacist before using salt substitutes.   Eat fresh foods.  Eat more vegetables and fruits.  Limit canned vegetables. If you do use them, rinse them well to decrease the sodium.    Limit cheese to 1 oz (28 g) per day.   Eat lower-sodium products, often labeled as "lower sodium" or "no salt added."  Avoid foods that contain monosodium glutamate (MSG). MSG is sometimes added to [place] food and some canned foods.  Check food labels (Nutrition Facts labels) on foods to learn how much sodium is in one serving.  Eat more home-cooked food and less restaurant, buffet, and fast food.  When eating at a restaurant, ask that your food be prepared with less salt or none, if possible.  HOW DO I READ FOOD LABELS FOR SODIUM INFORMATION? The Nutrition Facts label lists the amount of sodium in one serving of the food. If you eat more than one serving, you must multiply the listed amount of sodium by the number of servings. Food labels may also identify foods as:  Sodium free--Less than 5 mg in a serving.  Very low sodium--35 mg or less in a serving.  Low sodium--140 mg or less in a serving.  Light in sodium--50% less sodium in a serving. For example, if a food that usually has 300 mg of sodium is changed to become light in sodium, it will have 150 mg of sodium.  Reduced sodium--25% less sodium in a serving. For example, if a food that usually has 400 mg of sodium is changed to reduced sodium, it will have 300 mg of sodium. WHAT FOODS CAN I EAT? Grains Low-sodium cereals, including oats, puffed wheat  and rice, and shredded wheat cereals. Low-sodium crackers. Unsalted rice and pasta. Lower-sodium bread.  Vegetables Frozen or fresh vegetables. Low-sodium or reduced-sodium canned vegetables. Low-sodium or reduced-sodium tomato sauce and paste. Low-sodium or reduced-sodium tomato and vegetable juices.  Fruits Fresh, frozen, and canned fruit. Fruit juice.  Meat and Other Protein Products Low-sodium canned tuna and salmon. Fresh or frozen meat, poultry, seafood, and fish. Lamb. Unsalted nuts. Dried beans, peas, and lentils without added salt. Unsalted canned beans.  Homemade soups without salt. Eggs.  Dairy Milk. Soy milk. Ricotta cheese. Low-sodium or reduced-sodium cheeses. Yogurt.  Condiments Fresh and dried herbs and spices. Salt-free seasonings. Onion and garlic powders. Low-sodium varieties of mustard and ketchup. Lemon juice.  Fats and Oils Reduced-sodium salad dressings. Unsalted butter.  Other Unsalted popcorn and pretzels.  The items listed above may not be a complete list of recommended foods or beverages. Contact your dietitian for more options. WHAT FOODS ARE NOT RECOMMENDED? Grains Instant hot cereals. Bread stuffing, pancake, and biscuit mixes. Croutons. Seasoned rice or pasta mixes. Noodle soup cups. Boxed or frozen macaroni and cheese. Self-rising flour. Regular salted crackers. Vegetables Regular canned vegetables. Regular canned tomato sauce and paste. Regular tomato and vegetable juices. Frozen vegetables in sauces. Salted french fries. Olives. Angie Fava. Relishes. Sauerkraut. Salsa. Meat and Other Protein Products Salted, canned, smoked, spiced, or pickled meats, seafood, or fish. Bacon, ham, sausage, hot dogs, corned beef, chipped beef, and packaged luncheon meats. Salt pork. Jerky. Pickled herring. Anchovies, regular canned tuna, and sardines. Salted nuts. Dairy Processed cheese and cheese spreads. Cheese curds. Blue cheese and cottage cheese. Buttermilk.  Condiments Onion and garlic salt, seasoned salt, table salt, and sea salt. Canned and packaged gravies. Worcestershire sauce. Tartar sauce. Barbecue sauce. Teriyaki sauce. Soy sauce, including reduced sodium. Steak sauce. Fish sauce. Oyster sauce. Cocktail sauce. Horseradish. Regular ketchup and mustard. Meat flavorings and tenderizers. Bouillon cubes. Hot sauce. Tabasco sauce. Marinades. Taco seasonings. Relishes. Fats and Oils Regular salad dressings. Salted butter. Margarine. Ghee. Bacon fat.  Other Potato and tortilla chips. Corn chips and puffs. Salted popcorn  and pretzels. Canned or dried soups. Pizza. Frozen entrees and pot pies.  The items listed above may not be a complete list of foods and beverages to avoid. Contact your dietitian for more information. Document Released: 04/12/2002 Document Revised: 10/26/2013 Document Reviewed: 08/25/2013 [facility] Patient Information 2015 [place], [place]. This information is not intended to replace advice given to you by your health care provider. Make sure you discuss any questions you have with your health care provider.

## 2015-05-27 LAB — HIV ANTIBODY (ROUTINE TESTING W REFLEX): HIV: NONREACTIVE

## 2015-05-29 ENCOUNTER — Telehealth: Payer: Self-pay | Admitting: *Deleted

## 2015-05-29 NOTE — Telephone Encounter (Signed)
-----   Message from Boykin Nearing, MD sent at 05/29/2015 10:44 AM EDT ----- HIV negative Normal BMP

## 2015-05-29 NOTE — Telephone Encounter (Signed)
LVM to return call.

## 2015-05-30 NOTE — Telephone Encounter (Signed)
Patient is returning phone call, please f/u °

## 2015-05-31 NOTE — Telephone Encounter (Signed)
Pt aware of results 

## 2015-05-31 NOTE — Telephone Encounter (Signed)
Patient returned phone call, please f/u with pt.    °

## 2015-05-31 NOTE — Telephone Encounter (Signed)
LVM to return call.

## 2015-06-05 ENCOUNTER — Telehealth: Payer: Self-pay | Admitting: Family Medicine

## 2015-06-05 DIAGNOSIS — Z720 Tobacco use: Secondary | ICD-10-CM

## 2015-06-05 NOTE — Telephone Encounter (Signed)
Pt recently saw pcp on 7/22 and is requesting a refill on meloxicam (MOBIC) 15 MG table As well as a refill on his nicotine patches. Pt would also like a refresher on how to use the patches.  Please follow up with pt. Thank you.

## 2015-06-06 MED ORDER — NICOTINE 7 MG/24HR TD PT24: 7.0000 mg | MEDICATED_PATCH | Freq: Every day | TRANSDERMAL | Status: DC

## 2015-06-06 MED ORDER — NICOTINE 21 MG/24HR TD PT24: 21.0000 mg | MEDICATED_PATCH | Freq: Every day | TRANSDERMAL | Status: DC

## 2015-06-06 MED ORDER — NICOTINE 14 MG/24HR TD PT24: 14.0000 mg | MEDICATED_PATCH | Freq: Every day | TRANSDERMAL | Status: DC

## 2015-06-06 NOTE — Telephone Encounter (Signed)
Pt notified  Mobic at [place]  Nicotine patches Rx printed and placed at front office

## 2015-06-20 ENCOUNTER — Encounter: Payer: Self-pay | Admitting: Pharmacist

## 2015-08-15 ENCOUNTER — Telehealth: Payer: Self-pay | Admitting: Family Medicine

## 2015-08-15 NOTE — Telephone Encounter (Signed)
Patient called stating he has a cold, he is coughing, body aching, and nose is stuffed up and would like to know what he can take. Please f/u with pt.

## 2015-08-24 ENCOUNTER — Ambulatory Visit (HOSPITAL_COMMUNITY)
Admission: RE | Admit: 2015-08-24 | Discharge: 2015-08-24 | Disposition: A | Discharge status: Home / Self Care | Payer: 59 | Source: Ambulatory Visit | Attending: Family Medicine | Admitting: Family Medicine

## 2015-08-24 ENCOUNTER — Encounter: Payer: Self-pay | Admitting: Family Medicine

## 2015-08-24 ENCOUNTER — Ambulatory Visit (HOSPITAL_BASED_OUTPATIENT_CLINIC_OR_DEPARTMENT_OTHER): Payer: 59 | Admitting: Family Medicine

## 2015-08-24 VITALS — BP 142/94 | HR 89 | Temp 97.7°F | Resp 16 | Ht 67.0 in | Wt 260.0 lb

## 2015-08-24 DIAGNOSIS — Z87891 Personal history of nicotine dependence: Secondary | ICD-10-CM | POA: Insufficient documentation

## 2015-08-24 DIAGNOSIS — J069 Acute upper respiratory infection, unspecified: Secondary | ICD-10-CM | POA: Diagnosis not present

## 2015-08-24 DIAGNOSIS — J42 Unspecified chronic bronchitis: Secondary | ICD-10-CM

## 2015-08-24 DIAGNOSIS — IMO0001 Reserved for inherently not codable concepts without codable children: Secondary | ICD-10-CM | POA: Insufficient documentation

## 2015-08-24 DIAGNOSIS — R030 Elevated blood-pressure reading, without diagnosis of hypertension: Secondary | ICD-10-CM

## 2015-08-24 DIAGNOSIS — J441 Chronic obstructive pulmonary disease with (acute) exacerbation: Secondary | ICD-10-CM

## 2015-08-24 DIAGNOSIS — I517 Cardiomegaly: Secondary | ICD-10-CM | POA: Diagnosis not present

## 2015-08-24 DIAGNOSIS — Z720 Tobacco use: Secondary | ICD-10-CM

## 2015-08-24 MED ORDER — DOXYCYCLINE HYCLATE 100 MG PO TABS: 100.0000 mg | ORAL_TABLET | Freq: Two times a day (BID) | ORAL | Status: DC

## 2015-08-24 MED ORDER — VARENICLINE TARTRATE 1 MG PO TABS: 1.0000 mg | ORAL_TABLET | Freq: Two times a day (BID) | ORAL | Status: DC

## 2015-08-24 MED ORDER — PREDNISONE 50 MG PO TABS: 50.0000 mg | ORAL_TABLET | Freq: Every day | ORAL | Status: DC

## 2015-08-24 MED ORDER — VARENICLINE TARTRATE 0.5 MG X 11 & 1 MG X 42 PO MISC: ORAL | Status: DC

## 2015-08-24 MED ORDER — GUAIFENESIN ER 600 MG PO TB12: 1200.0000 mg | ORAL_TABLET | Freq: Two times a day (BID) | ORAL | Status: DC

## 2015-08-24 NOTE — Progress Notes (Signed)
Sx cold hx COPD No energy, unable to sleep, Tobacco user 3-per day to 1/2 pack  Pt stated unable to sleep through out the night This weekend stated had to drink ETHO to be able to have a good night sleep

## 2015-08-24 NOTE — Patient Instructions (Addendum)
Ricardo Burch was seen today for uri.  Diagnoses and all orders for this visit:  Chronic bronchitis, unspecified chronic bronchitis type ([place])  Elevated BP  COPD exacerbation ([facility]) -     predniSONE (DELTASONE) 50 MG tablet; Take 1 tablet (50 mg total) by mouth daily with breakfast. For 5 days -     doxycycline (VIBRA-TABS) 100 MG tablet; Take 1 tablet (100 mg total) by mouth 2 (two) times daily. -     guaiFENesin (MUCINEX) 600 MG 12 hr tablet; Take 2 tablets (1,200 mg total) by mouth 2 (two) times daily. -     DG Chest 2 View; Future  Tobacco abuse -     varenicline (CHANTIX STARTING MONTH PAK) 0.5 MG X 11 & 1 MG X 42 tablet; Taper per packet insert -     varenicline (CHANTIX CONTINUING MONTH PAK) 1 MG tablet; Take 1 tablet (1 mg total) by mouth 2 (two) times daily.  Start chantix: Take one 0.5 mg tablet by mouth once daily for 3 days, then increase to one 0.5 mg tablet twice daily for 4 days, then increase to one 1 mg tablet twice daily. Set quit date for 8-35 days after starting chantix.  Smoking cessation support: smoking cessation hotline: 1-800-QUIT-NOW.  Smoking cessation classes are available through [facility]. Call [phone] or visit our website at [email or web address]  I suspect URI with COPD exacerbation Due to your low O2 level I cannot prescribe any sleep aide  F/u in 10 days for COPD exacerbation f/u, flu shot and recheck BP f/u sooner if you develop CP, SOB, dizziness or lightheadedness   Dr. Adrian Blackwater

## 2015-08-24 NOTE — Progress Notes (Signed)
Patient ID: Ricardo Burch, male   DOB: [birth year], 54 y.o.   MRN: [id number]   Subjective:  Patient ID: Ricardo Burch, male    DOB: 06-20-1961  Age: 54 y.o. MRN: [id number]  CC: URI   HPI [name] presents for   1. Congestion: x 2 weeks. Girlfriend with URI. Patient with fatigue, trouble sleeping, productive cough x 2 weeks. He has had headache x 2 days. No fever. CP or SOB. Last used albuterol this AM before coming in appointment. Compliant with symbicort. Still smoking. No fever.   2. Insomnia: for past 2 weeks. Associated with URI. Snoring. Drank alcohol to sleep over the weekend.  3. Smoking: desires to quit. Is not using prescribed patches. Smoking 3-10 cigarettes per day.   Social History  Substance Use Topics  . Smoking status: Current Every Day Smoker -- 0.50 packs/day for 30 years    Types: Cigarettes  . Smokeless tobacco: Never Used  . Alcohol Use: 0.6 oz/week    1 Cans of beer per week     Comment: occassionally    Outpatient Prescriptions Prior to Visit  Medication Sig Dispense Refill  . albuterol (PROVENTIL HFA;VENTOLIN HFA) 108 (90 BASE) MCG/ACT inhaler Inhale 2 puffs into the lungs every 6 (six) hours as needed for wheezing or shortness of breath. 3 Inhaler 3  . aspirin 325 MG EC tablet Take 325 mg by mouth daily.    . budesonide-formoterol (SYMBICORT) 80-4.5 MCG/ACT inhaler Inhale 2 puffs into the lungs 2 (two) times daily. 3 Inhaler 3  . furosemide (LASIX) 20 MG tablet Take 1 tablet (20 mg total) by mouth daily. 30 tablet 3  . meloxicam (MOBIC) 15 MG tablet Take 1 tablet (15 mg total) by mouth daily with supper. For 5 days 10 tablet 0  . triamcinolone cream (KENALOG) 0.1 % Apply 1 application topically 2 (two) times daily. Apply to affected areas on hands and arms 85.2 g 3  . nicotine (NICODERM CQ - DOSED IN MG/24 HOURS) 14 mg/24hr patch Place 1 patch (14 mg total) onto the skin daily. (Patient not taking: Reported on 08/24/2015) 14 patch 0  . nicotine (NICODERM CQ -  DOSED IN MG/24 HOURS) 21 mg/24hr patch Place 1 patch (21 mg total) onto the skin daily. (Patient not taking: Reported on 08/24/2015) 42 patch 0  . nicotine (NICODERM CQ - DOSED IN MG/24 HR) 7 mg/24hr patch Place 1 patch (7 mg total) onto the skin daily. (Patient not taking: Reported on 08/24/2015) 14 patch 0   No facility-administered medications prior to visit.    ROS Review of Systems  Constitutional: Positive for fatigue. Negative for fever, chills and unexpected weight change.  HENT: Positive for congestion.   Eyes: Negative for visual disturbance.  Respiratory: Negative for cough and shortness of breath.   Cardiovascular: Negative for chest pain, palpitations and leg swelling.  Gastrointestinal: Negative for nausea, vomiting, abdominal pain, diarrhea, constipation and blood in stool.  Endocrine: Negative for polydipsia, polyphagia and polyuria.  Musculoskeletal: Negative for myalgias, back pain, arthralgias, gait problem and neck pain.  Skin: Negative for rash.  Allergic/Immunologic: Negative for immunocompromised state.  Neurological: Positive for headaches.  Hematological: Negative for adenopathy. Does not bruise/bleed easily.  Psychiatric/Behavioral: Positive for sleep disturbance. Negative for suicidal ideas and dysphoric mood. The patient is not nervous/anxious.     Objective:  BP 142/94 mmHg  Pulse 89  Temp(Src) 97.7 F (36.5 C)  Resp 16  Ht 5\' 7"  (1.702 m)  Wt 260 lb (117.935 kg)  BMI 40.71 kg/m2  SpO2 88%  BP/Weight 08/24/2015 05/26/2015 39/53/2023  Systolic BP 343 568 616  Diastolic BP 94 77 82  Wt. (Lbs) 260 251 237  BMI 40.71 39.3 37.11   Physical Exam  Constitutional: He appears well-developed and well-nourished. No distress.  HENT:  Head: Normocephalic and atraumatic.  Nose: Mucosal edema present.  Neck: Normal range of motion. Neck supple.  Cardiovascular: Normal rate, regular rhythm, normal heart sounds and intact distal pulses.   Pulmonary/Chest:  Effort normal and breath sounds normal.  Musculoskeletal: He exhibits no edema.  1 + edema at ankles   Neurological: He is alert.  Skin: Skin is warm and dry. No rash noted. No erythema.  Psychiatric: He has a normal mood and affect.  GAD-7: score of 2. 1-4,6. All others 0.    Assessment & Plan:   Problem List Items Addressed This Visit    COPD (chronic obstructive pulmonary disease) ([employer]) - Primary (Chronic)   Relevant Medications   varenicline (CHANTIX STARTING MONTH PAK) 0.5 MG X 11 & 1 MG X 42 tablet   varenicline (CHANTIX CONTINUING MONTH PAK) 1 MG tablet   predniSONE (DELTASONE) 50 MG tablet   guaiFENesin (MUCINEX) 600 MG 12 hr tablet   COPD exacerbation (HCC)   Relevant Medications   varenicline (CHANTIX STARTING MONTH PAK) 0.5 MG X 11 & 1 MG X 42 tablet   varenicline (CHANTIX CONTINUING MONTH PAK) 1 MG tablet   predniSONE (DELTASONE) 50 MG tablet   doxycycline (VIBRA-TABS) 100 MG tablet   guaiFENesin (MUCINEX) 600 MG 12 hr tablet   Elevated BP   Tobacco abuse (Chronic)   Relevant Medications   varenicline (CHANTIX STARTING MONTH PAK) 0.5 MG X 11 & 1 MG X 42 tablet   varenicline (CHANTIX CONTINUING MONTH PAK) 1 MG tablet      Meds ordered this encounter  Medications  . varenicline (CHANTIX STARTING MONTH PAK) 0.5 MG X 11 & 1 MG X 42 tablet    Sig: Taper per packet insert    Dispense:  53 tablet    Refill:  0  . varenicline (CHANTIX CONTINUING MONTH PAK) 1 MG tablet    Sig: Take 1 tablet (1 mg total) by mouth 2 (two) times daily.    Dispense:  60 tablet    Refill:  2  . predniSONE (DELTASONE) 50 MG tablet    Sig: Take 1 tablet (50 mg total) by mouth daily with breakfast. For 5 days    Dispense:  5 tablet    Refill:  0  . doxycycline (VIBRA-TABS) 100 MG tablet    Sig: Take 1 tablet (100 mg total) by mouth 2 (two) times daily.    Dispense:  20 tablet    Refill:  0  . guaiFENesin (MUCINEX) 600 MG 12 hr tablet    Sig: Take 2 tablets (1,200 mg total) by mouth 2  (two) times daily.    Dispense:  60 tablet    Refill:  0    Follow-up: Return in about 10 days (around 09/03/2015) for COPD exac f/u and flu shot .   Boykin Nearing MD

## 2015-09-12 ENCOUNTER — Other Ambulatory Visit: Payer: Self-pay | Admitting: Family Medicine

## 2015-09-12 ENCOUNTER — Telehealth: Payer: Self-pay | Admitting: Family Medicine

## 2015-09-12 ENCOUNTER — Other Ambulatory Visit: Payer: Self-pay | Admitting: Internal Medicine

## 2015-09-12 DIAGNOSIS — I5032 Chronic diastolic (congestive) heart failure: Secondary | ICD-10-CM

## 2015-09-12 NOTE — Telephone Encounter (Signed)
Patient called and requested a med refill for furosemide (LASIX) 20 MG tablet,   albuterol (PROVENTIL HFA;VENTOLIN HFA) 108 (90 BASE) MCG/ACT inhaler,  And budesonide-formoterol (SYMBICORT) 80-4.5 MCG/ACT inhaler.  Please f/u with pt.

## 2015-09-13 MED ORDER — FUROSEMIDE 20 MG PO TABS
20.0000 mg | ORAL_TABLET | Freq: Every day | ORAL | Status: DC
Start: 2015-09-13 — End: 2016-06-21

## 2015-09-13 NOTE — Telephone Encounter (Signed)
Rx send to [facility]  Left message rx at [facility]

## 2015-09-16 ENCOUNTER — Encounter (HOSPITAL_COMMUNITY): Payer: Self-pay

## 2015-09-16 ENCOUNTER — Inpatient Hospital Stay (HOSPITAL_COMMUNITY)
Admission: EM | Admit: 2015-09-16 | Discharge: 2015-09-20 | DRG: 291 | Disposition: A | Discharge status: Home / Self Care | Payer: 59 | Attending: Internal Medicine | Admitting: Internal Medicine

## 2015-09-16 ENCOUNTER — Emergency Department (HOSPITAL_COMMUNITY): Payer: 59

## 2015-09-16 DIAGNOSIS — I10 Essential (primary) hypertension: Secondary | ICD-10-CM | POA: Diagnosis present

## 2015-09-16 DIAGNOSIS — I959 Hypotension, unspecified: Secondary | ICD-10-CM | POA: Diagnosis present

## 2015-09-16 DIAGNOSIS — G9340 Encephalopathy, unspecified: Secondary | ICD-10-CM | POA: Diagnosis present

## 2015-09-16 DIAGNOSIS — I272 Other secondary pulmonary hypertension: Secondary | ICD-10-CM | POA: Diagnosis present

## 2015-09-16 DIAGNOSIS — R4182 Altered mental status, unspecified: Secondary | ICD-10-CM

## 2015-09-16 DIAGNOSIS — Z7982 Long term (current) use of aspirin: Secondary | ICD-10-CM

## 2015-09-16 DIAGNOSIS — I2781 Cor pulmonale (chronic): Secondary | ICD-10-CM | POA: Diagnosis present

## 2015-09-16 DIAGNOSIS — R7989 Other specified abnormal findings of blood chemistry: Secondary | ICD-10-CM

## 2015-09-16 DIAGNOSIS — Z720 Tobacco use: Secondary | ICD-10-CM

## 2015-09-16 DIAGNOSIS — Z789 Other specified health status: Secondary | ICD-10-CM

## 2015-09-16 DIAGNOSIS — J438 Other emphysema: Secondary | ICD-10-CM

## 2015-09-16 DIAGNOSIS — I5043 Acute on chronic combined systolic (congestive) and diastolic (congestive) heart failure: Secondary | ICD-10-CM | POA: Diagnosis present

## 2015-09-16 DIAGNOSIS — E875 Hyperkalemia: Secondary | ICD-10-CM | POA: Diagnosis present

## 2015-09-16 DIAGNOSIS — J449 Chronic obstructive pulmonary disease, unspecified: Secondary | ICD-10-CM | POA: Diagnosis present

## 2015-09-16 DIAGNOSIS — E871 Hypo-osmolality and hyponatremia: Secondary | ICD-10-CM | POA: Diagnosis present

## 2015-09-16 DIAGNOSIS — F1410 Cocaine abuse, uncomplicated: Secondary | ICD-10-CM | POA: Diagnosis present

## 2015-09-16 DIAGNOSIS — J441 Chronic obstructive pulmonary disease with (acute) exacerbation: Secondary | ICD-10-CM

## 2015-09-16 DIAGNOSIS — J9601 Acute respiratory failure with hypoxia: Secondary | ICD-10-CM | POA: Diagnosis not present

## 2015-09-16 DIAGNOSIS — J9621 Acute and chronic respiratory failure with hypoxia: Secondary | ICD-10-CM | POA: Diagnosis present

## 2015-09-16 DIAGNOSIS — G4733 Obstructive sleep apnea (adult) (pediatric): Secondary | ICD-10-CM | POA: Diagnosis present

## 2015-09-16 DIAGNOSIS — I5020 Unspecified systolic (congestive) heart failure: Secondary | ICD-10-CM | POA: Diagnosis not present

## 2015-09-16 DIAGNOSIS — N179 Acute kidney failure, unspecified: Secondary | ICD-10-CM | POA: Diagnosis present

## 2015-09-16 DIAGNOSIS — J9600 Acute respiratory failure, unspecified whether with hypoxia or hypercapnia: Secondary | ICD-10-CM | POA: Diagnosis present

## 2015-09-16 DIAGNOSIS — R0600 Dyspnea, unspecified: Secondary | ICD-10-CM | POA: Diagnosis not present

## 2015-09-16 DIAGNOSIS — R778 Other specified abnormalities of plasma proteins: Secondary | ICD-10-CM

## 2015-09-16 DIAGNOSIS — J9622 Acute and chronic respiratory failure with hypercapnia: Secondary | ICD-10-CM | POA: Diagnosis present

## 2015-09-16 DIAGNOSIS — J42 Unspecified chronic bronchitis: Secondary | ICD-10-CM | POA: Diagnosis not present

## 2015-09-16 DIAGNOSIS — Z9114 Patient's other noncompliance with medication regimen: Secondary | ICD-10-CM | POA: Diagnosis not present

## 2015-09-16 DIAGNOSIS — Z23 Encounter for immunization: Secondary | ICD-10-CM

## 2015-09-16 DIAGNOSIS — F17210 Nicotine dependence, cigarettes, uncomplicated: Secondary | ICD-10-CM | POA: Diagnosis present

## 2015-09-16 DIAGNOSIS — R0602 Shortness of breath: Secondary | ICD-10-CM | POA: Diagnosis not present

## 2015-09-16 DIAGNOSIS — I5033 Acute on chronic diastolic (congestive) heart failure: Secondary | ICD-10-CM

## 2015-09-16 DIAGNOSIS — J9602 Acute respiratory failure with hypercapnia: Secondary | ICD-10-CM | POA: Diagnosis not present

## 2015-09-16 DIAGNOSIS — R401 Stupor: Secondary | ICD-10-CM

## 2015-09-16 HISTORY — DX: Heart failure, unspecified: I50.9

## 2015-09-16 LAB — BASIC METABOLIC PANEL
ANION GAP: 9 (ref 5–15)
BUN: 25 mg/dL — ABNORMAL HIGH (ref 6–20)
CALCIUM: 8.6 mg/dL — AB (ref 8.9–10.3)
CO2: 36 mmol/L — ABNORMAL HIGH (ref 22–32)
Chloride: 89 mmol/L — ABNORMAL LOW (ref 101–111)
Creatinine, Ser: 1.34 mg/dL — ABNORMAL HIGH (ref 0.61–1.24)
GFR, EST NON AFRICAN AMERICAN: 60 mL/min — AB (ref >60)
GLUCOSE: 128 mg/dL — AB (ref 65–99)
POTASSIUM: 5.5 mmol/L — AB (ref 3.5–5.1)
SODIUM: 134 mmol/L — AB (ref 135–145)

## 2015-09-16 LAB — COMPREHENSIVE METABOLIC PANEL
ALBUMIN: 3.6 g/dL (ref 3.5–5.0)
ALT: 34 U/L (ref 17–63)
ANION GAP: 11 (ref 5–15)
AST: 46 U/L — ABNORMAL HIGH (ref 15–41)
Alkaline Phosphatase: 91 U/L (ref 38–126)
BILIRUBIN TOTAL: 2.2 mg/dL — AB (ref 0.3–1.2)
BUN: 23 mg/dL — ABNORMAL HIGH (ref 6–20)
CO2: 32 mmol/L (ref 22–32)
Calcium: 8.5 mg/dL — ABNORMAL LOW (ref 8.9–10.3)
Chloride: 90 mmol/L — ABNORMAL LOW (ref 101–111)
Creatinine, Ser: 1.53 mg/dL — ABNORMAL HIGH (ref 0.61–1.24)
GFR calc Af Amer: 59 mL/min — ABNORMAL LOW (ref >60)
GFR calc non Af Amer: 51 mL/min — ABNORMAL LOW (ref >60)
GLUCOSE: 71 mg/dL (ref 65–99)
POTASSIUM: 5.2 mmol/L — AB (ref 3.5–5.1)
SODIUM: 133 mmol/L — AB (ref 135–145)
TOTAL PROTEIN: 6.9 g/dL (ref 6.5–8.1)

## 2015-09-16 LAB — CBC WITH DIFFERENTIAL/PLATELET
BASOS PCT: 0 %
Basophils Absolute: 0 10*3/uL (ref 0.0–0.1)
EOS ABS: 0 10*3/uL (ref 0.0–0.7)
Eosinophils Relative: 0 %
HCT: 53 % — ABNORMAL HIGH (ref 39.0–52.0)
Hemoglobin: 17 g/dL (ref 13.0–17.0)
Lymphocytes Relative: 26 %
Lymphs Abs: 3.1 10*3/uL (ref 0.7–4.0)
MCH: 31.2 pg (ref 26.0–34.0)
MCHC: 32.1 g/dL (ref 30.0–36.0)
MCV: 97.2 fL (ref 78.0–100.0)
MONO ABS: 1.1 10*3/uL — AB (ref 0.1–1.0)
MONOS PCT: 9 %
Neutro Abs: 7.4 10*3/uL (ref 1.7–7.7)
Neutrophils Relative %: 65 %
Platelets: 230 10*3/uL (ref 150–400)
RBC: 5.45 MIL/uL (ref 4.22–5.81)
RDW: 14.6 % (ref 11.5–15.5)
WBC: 11.6 10*3/uL — ABNORMAL HIGH (ref 4.0–10.5)

## 2015-09-16 LAB — BLOOD GAS, ARTERIAL
Acid-Base Excess: 6.6 mmol/L — ABNORMAL HIGH (ref 0.0–2.0)
Bicarbonate: 38.6 mEq/L — ABNORMAL HIGH (ref 20.0–24.0)
DRAWN BY: 422461
Delivery systems: POSITIVE
Expiratory PAP: 5
FIO2: 0.35
Inspiratory PAP: 18
LHR: 8 {breaths}/min
Mode: POSITIVE
O2 SAT: 90.9 %
PATIENT TEMPERATURE: 98.7
PCO2 ART: 90.1 mmHg — AB (ref 35.0–45.0)
PH ART: 7.255 — AB (ref 7.350–7.450)
PO2 ART: 74.4 mmHg — AB (ref 80.0–100.0)
TCO2: 33.8 mmol/L (ref 0–100)

## 2015-09-16 LAB — D-DIMER, QUANTITATIVE (NOT AT ARMC): D DIMER QUANT: 0.42 ug{FEU}/mL (ref 0.00–0.48)

## 2015-09-16 LAB — URINALYSIS, ROUTINE W REFLEX MICROSCOPIC
Bilirubin Urine: NEGATIVE
GLUCOSE, UA: NEGATIVE mg/dL
Ketones, ur: 15 mg/dL — AB
LEUKOCYTES UA: NEGATIVE
NITRITE: NEGATIVE
PH: 5.5 (ref 5.0–8.0)
Protein, ur: NEGATIVE mg/dL
SPECIFIC GRAVITY, URINE: 1.01 (ref 1.005–1.030)
Urobilinogen, UA: 1 mg/dL (ref 0.0–1.0)

## 2015-09-16 LAB — RENAL FUNCTION PANEL
ALBUMIN: 3.7 g/dL (ref 3.5–5.0)
ANION GAP: 9 (ref 5–15)
BUN: 25 mg/dL — ABNORMAL HIGH (ref 6–20)
CHLORIDE: 89 mmol/L — AB (ref 101–111)
CO2: 37 mmol/L — ABNORMAL HIGH (ref 22–32)
Calcium: 8.7 mg/dL — ABNORMAL LOW (ref 8.9–10.3)
Creatinine, Ser: 1.47 mg/dL — ABNORMAL HIGH (ref 0.61–1.24)
GFR calc Af Amer: >60 mL/min (ref >60)
GFR calc non Af Amer: 54 mL/min — ABNORMAL LOW (ref >60)
GLUCOSE: 126 mg/dL — AB (ref 65–99)
PHOSPHORUS: 7.1 mg/dL — AB (ref 2.5–4.6)
POTASSIUM: 5.5 mmol/L — AB (ref 3.5–5.1)
Sodium: 135 mmol/L (ref 135–145)

## 2015-09-16 LAB — TROPONIN I
Troponin I: 0.51 ng/mL (ref <0.031)
Troponin I: 0.48 ng/mL — ABNORMAL HIGH (ref <0.031)
Troponin I: 0.46 ng/mL — ABNORMAL HIGH (ref <0.031)

## 2015-09-16 LAB — SODIUM, URINE, RANDOM: Sodium, Ur: 30 mmol/L

## 2015-09-16 LAB — PROCALCITONIN

## 2015-09-16 LAB — PROTIME-INR
INR: 1.15 (ref 0.00–1.49)
PROTHROMBIN TIME: 14.9 s (ref 11.6–15.2)

## 2015-09-16 LAB — URINE MICROSCOPIC-ADD ON

## 2015-09-16 LAB — MRSA PCR SCREENING: MRSA by PCR: NEGATIVE

## 2015-09-16 LAB — TSH: TSH: 0.668 u[IU]/mL (ref 0.350–4.500)

## 2015-09-16 LAB — APTT: aPTT: 32 seconds (ref 24–37)

## 2015-09-16 LAB — BRAIN NATRIURETIC PEPTIDE: B Natriuretic Peptide: 1091.5 pg/mL — ABNORMAL HIGH (ref 0.0–100.0)

## 2015-09-16 LAB — MAGNESIUM: MAGNESIUM: 2.3 mg/dL (ref 1.7–2.4)

## 2015-09-16 LAB — CREATININE, URINE, RANDOM: CREATININE, URINE: 65.52 mg/dL

## 2015-09-16 MED ORDER — HEPARIN SODIUM (PORCINE) 5000 UNIT/ML IJ SOLN
5000.0000 [IU] | Freq: Three times a day (TID) | INTRAMUSCULAR | Status: DC
  Administered 2015-09-16 – 2015-09-20 (×11): 5000 [IU] via SUBCUTANEOUS
  Filled 2015-09-16 (×12): qty 1

## 2015-09-16 MED ORDER — IPRATROPIUM-ALBUTEROL 0.5-2.5 (3) MG/3ML IN SOLN
3.0000 mL | Freq: Four times a day (QID) | RESPIRATORY_TRACT | Status: DC
  Administered 2015-09-16: 3 mL via RESPIRATORY_TRACT
  Filled 2015-09-16: qty 3

## 2015-09-16 MED ORDER — METHYLPREDNISOLONE SODIUM SUCC 125 MG IJ SOLR
125.0000 mg | Freq: Once | INTRAMUSCULAR | Status: AC
  Administered 2015-09-16: 125 mg via INTRAVENOUS
  Filled 2015-09-16: qty 2

## 2015-09-16 MED ORDER — METHYLPREDNISOLONE SODIUM SUCC 125 MG IJ SOLR
60.0000 mg | Freq: Three times a day (TID) | INTRAMUSCULAR | Status: DC
  Administered 2015-09-17: 60 mg via INTRAVENOUS
  Filled 2015-09-16: qty 2

## 2015-09-16 MED ORDER — FUROSEMIDE 10 MG/ML IJ SOLN
60.0000 mg | INTRAMUSCULAR | Status: AC
  Administered 2015-09-16: 60 mg via INTRAVENOUS
  Filled 2015-09-16: qty 6

## 2015-09-16 MED ORDER — BUDESONIDE-FORMOTEROL FUMARATE 80-4.5 MCG/ACT IN AERO
2.0000 | INHALATION_SPRAY | Freq: Two times a day (BID) | RESPIRATORY_TRACT | Status: DC
  Administered 2015-09-16: 2 via RESPIRATORY_TRACT
  Filled 2015-09-16: qty 6.9

## 2015-09-16 MED ORDER — NICOTINE 21 MG/24HR TD PT24
21.0000 mg | MEDICATED_PATCH | Freq: Every day | TRANSDERMAL | Status: DC
  Administered 2015-09-16 – 2015-09-20 (×4): 21 mg via TRANSDERMAL
  Filled 2015-09-16 (×5): qty 1

## 2015-09-16 MED ORDER — IPRATROPIUM-ALBUTEROL 0.5-2.5 (3) MG/3ML IN SOLN: 3.0000 mL | Freq: Four times a day (QID) | RESPIRATORY_TRACT | Status: DC | PRN

## 2015-09-16 MED ORDER — ENOXAPARIN SODIUM 60 MG/0.6ML ~~LOC~~ SOLN
60.0000 mg | SUBCUTANEOUS | Status: DC
Start: 2015-09-16 — End: 2015-09-16
  Administered 2015-09-16: 60 mg via SUBCUTANEOUS
  Filled 2015-09-16: qty 0.6

## 2015-09-16 MED ORDER — ACETAMINOPHEN 325 MG PO TABS: 650.0000 mg | ORAL_TABLET | Freq: Four times a day (QID) | ORAL | Status: DC | PRN

## 2015-09-16 MED ORDER — ALBUTEROL (5 MG/ML) CONTINUOUS INHALATION SOLN
10.0000 mg/h | INHALATION_SOLUTION | Freq: Once | RESPIRATORY_TRACT | Status: AC
  Administered 2015-09-16: 10 mg/h via RESPIRATORY_TRACT
  Filled 2015-09-16: qty 20

## 2015-09-16 MED ORDER — CETYLPYRIDINIUM CHLORIDE 0.05 % MT LIQD
7.0000 mL | Freq: Two times a day (BID) | OROMUCOSAL | Status: DC
  Administered 2015-09-17 – 2015-09-19 (×5): 7 mL via OROMUCOSAL

## 2015-09-16 MED ORDER — ONDANSETRON HCL 4 MG/2ML IJ SOLN: 4.0000 mg | Freq: Four times a day (QID) | INTRAMUSCULAR | Status: DC | PRN

## 2015-09-16 MED ORDER — PANTOPRAZOLE SODIUM 40 MG IV SOLR
40.0000 mg | Freq: Every day | INTRAVENOUS | Status: DC
  Administered 2015-09-16: 40 mg via INTRAVENOUS
  Filled 2015-09-16: qty 40

## 2015-09-16 MED ORDER — DOXYCYCLINE HYCLATE 100 MG IV SOLR
100.0000 mg | Freq: Two times a day (BID) | INTRAVENOUS | Status: DC
  Administered 2015-09-16: 100 mg via INTRAVENOUS
  Filled 2015-09-16 (×2): qty 100

## 2015-09-16 MED ORDER — DEXMEDETOMIDINE HCL IN NACL 400 MCG/100ML IV SOLN
0.4000 ug/kg/h | INTRAVENOUS | Status: DC
  Administered 2015-09-16: 0.4 ug/kg/h via INTRAVENOUS
  Filled 2015-09-16: qty 100

## 2015-09-16 MED ORDER — ALBUTEROL SULFATE (2.5 MG/3ML) 0.083% IN NEBU: 2.5000 mg | INHALATION_SOLUTION | RESPIRATORY_TRACT | Status: DC | PRN

## 2015-09-16 MED ORDER — IPRATROPIUM-ALBUTEROL 0.5-2.5 (3) MG/3ML IN SOLN
3.0000 mL | RESPIRATORY_TRACT | Status: DC
  Administered 2015-09-17 (×2): 3 mL via RESPIRATORY_TRACT
  Filled 2015-09-16 (×2): qty 3

## 2015-09-16 MED ORDER — ACETAMINOPHEN 650 MG RE SUPP: 650.0000 mg | Freq: Four times a day (QID) | RECTAL | Status: DC | PRN

## 2015-09-16 MED ORDER — FUROSEMIDE 10 MG/ML IJ SOLN: 40.0000 mg | Freq: Every day | INTRAMUSCULAR | Status: DC

## 2015-09-16 MED ORDER — BUDESONIDE 0.5 MG/2ML IN SUSP
0.5000 mg | Freq: Two times a day (BID) | RESPIRATORY_TRACT | Status: DC
  Administered 2015-09-16: 0.5 mg via RESPIRATORY_TRACT
  Filled 2015-09-16: qty 2

## 2015-09-16 MED ORDER — METHYLPREDNISOLONE SODIUM SUCC 125 MG IJ SOLR
60.0000 mg | Freq: Two times a day (BID) | INTRAMUSCULAR | Status: DC
  Administered 2015-09-16: 60 mg via INTRAVENOUS

## 2015-09-16 MED ORDER — METHYLPREDNISOLONE SODIUM SUCC 125 MG IJ SOLR
125.0000 mg | INTRAMUSCULAR | Status: DC
  Filled 2015-09-16: qty 2

## 2015-09-16 MED ORDER — IPRATROPIUM BROMIDE 0.02 % IN SOLN
0.5000 mg | Freq: Once | RESPIRATORY_TRACT | Status: AC
  Administered 2015-09-16: 0.5 mg via RESPIRATORY_TRACT
  Filled 2015-09-16: qty 2.5

## 2015-09-16 MED ORDER — SODIUM CHLORIDE 0.9 % IV SOLN
INTRAVENOUS | Status: DC
  Administered 2015-09-16: via INTRAVENOUS
  Administered 2015-09-16: 20 mL/h via INTRAVENOUS

## 2015-09-16 MED ORDER — ONDANSETRON HCL 4 MG PO TABS
4.0000 mg | ORAL_TABLET | Freq: Four times a day (QID) | ORAL | Status: DC | PRN
Start: 2015-09-16 — End: 2015-09-17

## 2015-09-16 MED ORDER — ASPIRIN EC 325 MG PO TBEC
325.0000 mg | DELAYED_RELEASE_TABLET | Freq: Every day | ORAL | Status: DC
  Administered 2015-09-17 – 2015-09-20 (×4): 325 mg via ORAL
  Filled 2015-09-16 (×4): qty 1

## 2015-09-16 MED ORDER — DEXMEDETOMIDINE HCL IN NACL 200 MCG/50ML IV SOLN: 0.4000 ug/kg/h | INTRAVENOUS | Status: DC

## 2015-09-16 NOTE — Consult Note (Signed)
PULMONARY / CRITICAL CARE MEDICINE   Name: Ricardo Burch MRN: [id number] DOB: 1961-01-24    ADMISSION DATE:  09/16/2015 CONSULTATION DATE:  09/16/2015  REFERRING MD :  [facility]  CHIEF COMPLAINT:  Short of breath  INITIAL PRESENTATION:  54 yo male smoker presented with progressive dyspnea and leg edema for one week.  He was admitted with AECOPD and diastolic CHF.  Became obtunded and PCCM consulted.  STUDIES:  TTE 03/28/14:  LV size normal. EF 50-55%. Grade 1 diastolic dysfunction. LA normal in size. RA mildly dilated. RV mild to moderate dilation w/ mildly decreased systolic function. RVSP 53mmHg. Septal motion c/w conduction delay. No aortic stenosis or regurg. No pulmonic regurg. Mild tricuspid regurg. No pericardial effusion. CXR PA/LAT 11/12 (personally reviewed by me):  Mild hyperinflation. No opacity or effusion. Cardiomegaly. EKG 11/12 (personally reviewed by me):  Sinus rhythm with RBBB. No obvious ischemia. Previously RBBB incomplete.  SIGNIFICANT EVENTS: 11/12 - Admit & obtunded 11/12 - Placed on BiPAP  HISTORY OF PRESENT ILLNESS:  History from chart as patient is obtunded. 54 yo male was having shortness of breath and leg swelling for one week.  He was given prednisone and lasix by his PCP.  This did not help.  He came to the ER.  He was found to be hypoxic.  He was placed on supplemental oxygen and started on nebulizer therapy.  He was given solumedrol.  He was admitted by the hospitalist.  He was noted to have worsening mental status, and became obtunded.  He was placed on BiPAP.  PCCM was asked to assess. Patient was given Lasix 60mg  @ 9:45pm.  PAST MEDICAL HISTORY :  Past Medical History  Diagnosis Date  . Eczema     since childhood   . COPD (chronic obstructive pulmonary disease) ([place]) Dx 2015    PAST SURGICAL HISTORY: No significant past surgical history per the electronic medical record.   Medication Sig  aspirin 325 MG EC tablet Take 325 mg by mouth  daily.  furosemide (LASIX) 20 MG tablet Take 1 tablet (20 mg total) by mouth daily.  SYMBICORT 80-4.5 MCG/ACT inhaler INHALE 2 PUFFS INTO THE LUNGS 2 TIMES DAILY  VENTOLIN HFA 108 (90 BASE) MCG/ACT inhaler INHALE 2 PUFFS INTO THE LUNGS EVERY 6 HOURS AS NEEDED FOR WHEEZING OR SHORTNESS OF BREATH  doxycycline (VIBRA-TABS) 100 MG tablet Take 1 tablet (100 mg total) by mouth 2 (two) times daily. Patient not taking: Reported on 09/16/2015  guaiFENesin (MUCINEX) 600 MG 12 hr tablet Take 2 tablets (1,200 mg total) by mouth 2 (two) times daily. Patient not taking: Reported on 09/16/2015  meloxicam (MOBIC) 15 MG tablet Take 1 tablet (15 mg total) by mouth daily with supper. For 5 days Patient not taking: Reported on 09/16/2015  predniSONE (DELTASONE) 50 MG tablet Take 1 tablet (50 mg total) by mouth daily with breakfast. For 5 days Patient not taking: Reported on 09/16/2015  triamcinolone cream (KENALOG) 0.1 % Apply 1 application topically 2 (two) times daily. Apply to affected areas on hands and arms Patient not taking: Reported on 09/16/2015  varenicline (CHANTIX CONTINUING MONTH PAK) 1 MG tablet Take 1 tablet (1 mg total) by mouth 2 (two) times daily. Patient not taking: Reported on 09/16/2015    No Known Allergies  FAMILY HISTORY:  Family History  Problem Relation Age of Onset  . Cerebral aneurysm Father   . Cancer Neg Hx   . Diabetes Neg Hx   . Heart disease Neg Hx  SOCIAL HISTORY: Social History   Social History  . Marital Status: Single    Spouse Name: N/A  . Number of Children: 4   . Years of Education: 12   Occupational History  . Unemployed     Social History Main Topics  . Smoking status: Current Every Day Smoker -- 0.50 packs/day for 30 years    Types: Cigarettes  . Smokeless tobacco: Never Used  . Alcohol Use: 0.6 oz/week    1 Cans of beer per week     Comment: occassionally  . Drug Use: No  . Sexual Activity: No   Other Topics Concern  . None   Social  History Narrative    Lives alone.    4 daughters 60, 52 yo twins, eldest married live in [place].     REVIEW OF SYSTEMS:  Unable to obtain due to obtundation.  SUBJECTIVE:   VITAL SIGNS: Temp:  [98.3 F (36.8 C)-99 F (37.2 C)] 98.7 F (37.1 C) (11/12 2005) Pulse Rate:  [85-108] 108 (11/12 1800) Resp:  [16-23] 21 (11/12 1800) BP: (119-161)/(84-102) 149/93 mmHg (11/12 1800) SpO2:  [75 %-96 %] 96 % (11/12 2043) FiO2 (%):  [40 %] 40 % (11/12 2043) Weight:  [117.7 kg (259 lb 7.7 oz)] 117.7 kg (259 lb 7.7 oz) (11/12 1459) HEMODYNAMICS:   VENTILATOR SETTINGS: Vent Mode:  [-]  FiO2 (%):  [40 %] 40 % INTAKE / OUTPUT:  Intake/Output Summary (Last 24 hours) at 09/16/15 2136 Last data filed at 09/16/15 2030  Gross per 24 hour  Intake    250 ml  Output   1100 ml  Net   -850 ml    PHYSICAL EXAMINATION: General:  Obtunded. No distress. On BiPAP. Neuro:  Withdraws to pain in all 4 extremities. Pupils pinpoint. Doesn't follow commands. HEENT:  Poor dentition. BiPAP mask in place. No scleral icterus. Cardiovascular:  Sinus rhythm. Regular rate. Pitting lower extremity edema. Lungs:  Coarse breath sounds bilaterally. Symmetric chest rise on BiPAP. Abdomen:  Soft. Protuberant. Normal bowel sounds. Musculoskeletal:  Normal bulk and tone. No joint effusion appreciated. Skin:  Warm & dry. No rash on exposed skin.  LABS:  CBC  Recent Labs Lab 09/16/15 1140  WBC 11.6*  HGB 17.0  HCT 53.0*  PLT 230   Coag's No results for input(s): APTT, INR in the last 168 hours.   BMET  Recent Labs Lab 09/16/15 1140  NA 133*  K 5.2*  CL 90*  CO2 32  BUN 23*  CREATININE 1.53*  GLUCOSE 71   Electrolytes  Recent Labs Lab 09/16/15 1140  CALCIUM 8.5*   Sepsis Markers No results for input(s): LATICACIDVEN, PROCALCITON, O2SATVEN in the last 168 hours.   ABG No results for input(s): PHART, PCO2ART, PO2ART in the last 168 hours.   Liver Enzymes  Recent Labs Lab  09/16/15 1140  AST 46*  ALT 34  ALKPHOS 91  BILITOT 2.2*  ALBUMIN 3.6   Cardiac Enzymes  Recent Labs Lab 09/16/15 1140 09/16/15 1600  TROPONINI 0.51* 0.48*   Glucose No results for input(s): GLUCAP in the last 168 hours.   Lab Results  Component Value Date   TSH 0.668 09/16/2015    Imaging Dg Chest 2 View  09/16/2015  CLINICAL DATA:  Pt states SOB x 3-4 days. Pt states he "could hardly walk anymore". Hx COPD EXAM: CHEST  2 VIEW COMPARISON:  08/24/2015 FINDINGS: Lungs are hyperinflated. Heart is enlarged and stable in configuration. There are no focal consolidations or  pleural effusions. Old rib fractures are noted. There are degenerative changes in the mid thoracic spine. IMPRESSION: 1. Hyperinflation. 2. Stable cardiomegaly. Electronically Signed   By: Nolon Nations M.D.   On: 09/16/2015 11:55     ASSESSMENT / PLAN:  Unfortunate 54 year old male with known COPD as well as chronic alcohol & tobacco use. Patient had rapid decline in mental status with hypercarbic respiratory failure likely from a COPD exacerbation. Patient's mental status is improving slowly with NIPPV. Elevated leukocytosis concerning for an infectious etiology versus stress response. Reviewing patient's prior TTE he had RV failure with preserved LV function raising concern for pulmonary hypertension. Given his body habitus occult OSA/OHS must be considered. I can find no documentation of a polysomnogram.  1. Altered Mental Status:  Secondary to acute hypercarbic respiratory failure. Improving. Continuing treatment of respiratory failure. Patient full code per prior documentation. Limiting sedating medications. Utilizing Precedex to improve tolerance of BiPAP mask as he wakes up. 2. Acute on Chronic Hypercarbic Respiratory Failure:  Likely secondary to COPD exacerbation. Plan for repeat ABG at 2300 this evening. Continuing BiPAP 18/5 with FiO2 0.3 & rate 28. 3. Acute Hypoxic Respiratory Failure:  Suspect  secondary to congestive heart failure. Continuing BiPAP & diuresis. Optimal saturation 88-92% to minimize VQ mismatching. 4. COPD Exacerbation:  Changing Duonebs to q4hr. Continuing Solu-Medrol 60mg  IV q8hr. Doxycycline 100mg  IV q12hr. 5. Probable Systolic Congestive Heart Failure:  S/P Lasix x1 at 9:45 pm. Plan to re-dose Lasix as needed. Trending Troponin I. TTE ordered & pending. 6. Probable Acute Bronchitis:  Procalcitonin algorithm. Checking Respiratory Viral Panel PCR and Blood Cultures x2. 7. Acute Renal Failure:  Monitoring UOP with diuresis. Trending renal function with daily BUN/Creatinine. Renal US ordered & pending. 8. Hyperkalemia:  Monitoring on telemetry. Repeating electrolytes now. 9. Hyponatremia:  Mild. Repeat electrolyte panel now & in AM. 10. Chronic EtOH Use:  Precedex as needed for altered mentation as he improves on BiPAP. 11. Chronic Tobacco Use:  Plan for tobacco cessation education prior to discharge. Nicotine patch ordered.  12. Cor Pulmonale/Pulmonary Hypertension:  Repeat TTE. Patient would benefit from evaluation for occult OSA/OHS as outpatient with polysomnogram. 13. Prophylaxis:  Protonix IV daily, SCDs, & Heparin [place] q8hr. 14. Diet:  NPO until mental status improves.  I have spent a total of 31 minutes of critical care time today caring for the patient, discussing the plan of care with staff at bedside, & reviewing the patient's electronic medical record.  Sonia Baller Ashok Cordia, M.D. [facility] Pulmonary & Critical Care Pager:  [phone] After 3pm or if no response, call [phone] 09/16/2015, 9:36 PM

## 2015-09-16 NOTE — H&P (Signed)
[facility] History and Physical  Ricardo Burch [id number] DOB: Nov 02, 1961 DOA: 09/16/2015  Referring physician: EDP PCP: [name], MD   Chief Complaint: sob and leg edema for more than a week.   HPI: Ricardo Burch is a 54 y.o. male  With h/o hypertension, COPD, chronic smoker, presents to ED with worsening sob and leg edema since one week. He reports going to his PCP and he was given prescription for lasix and prednisone taper, he reports his symptoms didn't get and came to ED. He continues to smoke and take alcohol. He reports having orthopnea sometimes.  On arrival to ED, he was hypoxic requiring 50% venti mask and we have transitioned him to [place] oxygen. His d dimer is negativ e. Labs revealed renal insufficiency, hyperkalemia, low sodium and elevated BNP, troponin, and AST. HIS CBC shows mild leukocytosis, and normal tsh.  He was referred to medical service for admission.    Review of Systems:  Constitutional: tachypneic HEENT:  No headaches, Difficulty swallowing,Tooth/dental problems,Sore throat,  No sneezing, itching, ear ache, nasal congestion, post nasal drip,  Cardio-vascular:  No chest pain, orthopnea present, palpitations present. Pedal edema.  GI:  No heartburn, indigestion, abdominal pain, nausea, vomiting, diarrhea, change in bowel habits, loss of appetite  Resp:  Sob, dry cough.  Skin:  no rash or lesions.  GU:  no dysuria, change in color of urine, no urgency or frequency. No flank pain.  Musculoskeletal:  pedal edema.  Psych:  No change in mood or affect. No depression or anxiety. No memory loss.   Past Medical History  Diagnosis Date  . Eczema     since childhood   . COPD (chronic obstructive pulmonary disease) ([place]) Dx 2015   No past surgical history on file. Social History:  reports that he has been smoking Cigarettes.  He has a 15 pack-year smoking history. He has never used smokeless tobacco. He reports that he drinks about 0.6 oz of  alcohol per week. He reports that he does not use illicit drugs.  No Known Allergies  Family History  Problem Relation Age of Onset  . Cerebral aneurysm Father   . Cancer Neg Hx   . Diabetes Neg Hx   . Heart disease Neg Hx     Prior to Admission medications   Medication Sig Start Date End Date Taking? Authorizing Provider  aspirin 325 MG EC tablet Take 325 mg by mouth daily.   Yes Historical Provider, MD  furosemide (LASIX) 20 MG tablet Take 1 tablet (20 mg total) by mouth daily. 09/13/15  Yes Josalyn Funches, MD  SYMBICORT 80-4.5 MCG/ACT inhaler INHALE 2 PUFFS INTO THE LUNGS 2 TIMES DAILY 09/12/15  Yes Josalyn Funches, MD  VENTOLIN HFA 108 (90 BASE) MCG/ACT inhaler INHALE 2 PUFFS INTO THE LUNGS EVERY 6 HOURS AS NEEDED FOR WHEEZING OR SHORTNESS OF BREATH 09/12/15  Yes Tresa Garter, MD  doxycycline (VIBRA-TABS) 100 MG tablet Take 1 tablet (100 mg total) by mouth 2 (two) times daily. Patient not taking: Reported on 09/16/2015 08/24/15   Boykin Nearing, MD  guaiFENesin (MUCINEX) 600 MG 12 hr tablet Take 2 tablets (1,200 mg total) by mouth 2 (two) times daily. Patient not taking: Reported on 09/16/2015 08/24/15   Boykin Nearing, MD  meloxicam (MOBIC) 15 MG tablet Take 1 tablet (15 mg total) by mouth daily with supper. For 5 days Patient not taking: Reported on 09/16/2015 05/26/15   Boykin Nearing, MD  predniSONE (DELTASONE) 50 MG tablet Take 1 tablet (50  mg total) by mouth daily with breakfast. For 5 days Patient not taking: Reported on 09/16/2015 08/24/15   Boykin Nearing, MD  triamcinolone cream (KENALOG) 0.1 % Apply 1 application topically 2 (two) times daily. Apply to affected areas on hands and arms Patient not taking: Reported on 09/16/2015 08/23/14   Boykin Nearing, MD  varenicline (CHANTIX CONTINUING MONTH PAK) 1 MG tablet Take 1 tablet (1 mg total) by mouth 2 (two) times daily. Patient not taking: Reported on 09/16/2015 08/24/15   Boykin Nearing, MD   Physical  Exam: Filed Vitals:   09/16/15 1500 09/16/15 1600 09/16/15 1700 09/16/15 1800  BP: 161/102  133/86 149/93  Pulse: 94 96 95 108  Temp: 98.7 F (37.1 C) 98.7 F (37.1 C)    TempSrc: Oral Oral    Resp: 21 23 22 21   Height:      Weight:      SpO2: 93% 95% 95% 92%    Wt Readings from Last 3 Encounters:  09/16/15 117.7 kg (259 lb 7.7 oz)  08/24/15 117.935 kg (260 lb)  05/26/15 113.853 kg (251 lb)    General:  Appears anxious, tachypnic and on 2 lit [place] oxygen.  Eyes: PERRL, normal lids, irises & conjunctiva Neck: no LAD, masses or thyromegaly Cardiovascular: RRR, no m/r/g.  Telemetry: SR, no arrhythmias  Respiratory: CTA bilaterally, no w/r/r. Normal respiratory effort. Abdomen: soft, ntnd Skin: no rash or induration seen on limited exam Musculoskeletal: bilateral lower extremity 2+ edema.  Neurologic: grossly non-focal.          Labs on Admission:  Basic Metabolic Panel:  Recent Labs Lab 09/16/15 1140  NA 133*  K 5.2*  CL 90*  CO2 32  GLUCOSE 71  BUN 23*  CREATININE 1.53*  CALCIUM 8.5*   Liver Function Tests:  Recent Labs Lab 09/16/15 1140  AST 46*  ALT 34  ALKPHOS 91  BILITOT 2.2*  PROT 6.9  ALBUMIN 3.6   No results for input(s): LIPASE, AMYLASE in the last 168 hours. No results for input(s): AMMONIA in the last 168 hours. CBC:  Recent Labs Lab 09/16/15 1140  WBC 11.6*  NEUTROABS 7.4  HGB 17.0  HCT 53.0*  MCV 97.2  PLT 230   Cardiac Enzymes:  Recent Labs Lab 09/16/15 1140 09/16/15 1600  TROPONINI 0.51* 0.48*    BNP (last 3 results)  Recent Labs  09/16/15 1140  BNP 1091.5*    ProBNP (last 3 results) No results for input(s): PROBNP in the last 8760 hours.  CBG: No results for input(s): GLUCAP in the last 168 hours.  Radiological Exams on Admission: Dg Chest 2 View  09/16/2015  CLINICAL DATA:  Pt states SOB x 3-4 days. Pt states he "could hardly walk anymore". Hx COPD EXAM: CHEST  2 VIEW COMPARISON:  08/24/2015 FINDINGS:  Lungs are hyperinflated. Heart is enlarged and stable in configuration. There are no focal consolidations or pleural effusions. Old rib fractures are noted. There are degenerative changes in the mid thoracic spine. IMPRESSION: 1. Hyperinflation. 2. Stable cardiomegaly. Electronically Signed   By: Nolon Nations M.D.   On: 09/16/2015 11:55    EKG: reviewed, showed sinus rhythm with biatrial enlargement.   Assessment/Plan Active Problems:   COPD (chronic obstructive pulmonary disease) (HCC)   Acute respiratory failure (HCC)   Acute respiratory failure with hypoxia possibly from acute copd exacerbation and mild CHF exacerbation: Admit to step down, and use [place] oxygen to keep sats greater than 90%.  Bronchodilators, iv steroids, and resume symbicort.  Nicotine patch.  No pneumonia on CXR.   Start patient on IV lasix, serial troponins, echocardiogram.  Repeat eKG in am,  Strict intake and output .   Elevated troponins: possibly from demand ischemia.  EKG shows t wave inversions in v2, v3 and v4.  Repeat eKG in am, get echocardiogram.  He currently denies any chest pain.   Acute renal failure: Unclear etiology.  UA is negative for infection, he reports non compliant to medications.  Get US RENAL to rule out obstruction and get urine electrolytes.  Repeat renal parameters in am.     Hyperkalemia : No peaked t waves. Repeat BMP tonight.     Code Status:  Full coe.  DVT Prophylaxis: Family Communication:  None at bedside.  Disposition Plan: pending resolution of the respiratory failure. Possibly 2 to 3 days.   Time spent: 65 min  [facility] Pager [phone]

## 2015-09-16 NOTE — ED Notes (Signed)
Pt found to have continuous neb tx off face.  Sats in 75 range.  Placed oxygen back on [place] d/t neb being completed.  Notified MD.  Order for venti mask.  By the time return to room, sats had improved to 90%.  Pt not labored and talking through breathing.

## 2015-09-16 NOTE — ED Provider Notes (Signed)
CSN: [id number]     Arrival date & time 09/16/15  1030 History   First MD Initiated Contact with Patient 09/16/15 1101     Chief Complaint  Patient presents with  . Shortness of Breath  . Leg Swelling     (Consider location/radiation/quality/duration/timing/severity/associated sxs/prior Treatment) HPI Comments: Patient here with several days of increasing dyspnea on exertion as well as lower extremity edema. Does have a history of COPD but denies history of CHF. Denies any anginal type chest pain. Has had a nonproductive cough without fever or chills. Symptoms persistent and better with rest. No treatment used for this prior to arrival.  The history is provided by the patient.    Past Medical History  Diagnosis Date  . Eczema     since childhood   . COPD (chronic obstructive pulmonary disease) ([place]) Dx 2015   No past surgical history on file. Family History  Problem Relation Age of Onset  . Cerebral aneurysm Father   . Cancer Neg Hx   . Diabetes Neg Hx   . Heart disease Neg Hx    Social History  Substance Use Topics  . Smoking status: Current Every Day Smoker -- 0.50 packs/day for 30 years    Types: Cigarettes  . Smokeless tobacco: Never Used  . Alcohol Use: 0.6 oz/week    1 Cans of beer per week     Comment: occassionally    Review of Systems  All other systems reviewed and are negative.     Allergies  Review of patient's allergies indicates no known allergies.  Home Medications   Prior to Admission medications   Medication Sig Start Date End Date Taking? Authorizing Provider  aspirin 325 MG EC tablet Take 325 mg by mouth daily.    Historical Provider, MD  doxycycline (VIBRA-TABS) 100 MG tablet Take 1 tablet (100 mg total) by mouth 2 (two) times daily. 08/24/15   Josalyn Funches, MD  furosemide (LASIX) 20 MG tablet Take 1 tablet (20 mg total) by mouth daily. 09/13/15   Josalyn Funches, MD  guaiFENesin (MUCINEX) 600 MG 12 hr tablet Take 2 tablets (1,200 mg  total) by mouth 2 (two) times daily. 08/24/15   Josalyn Funches, MD  meloxicam (MOBIC) 15 MG tablet Take 1 tablet (15 mg total) by mouth daily with supper. For 5 days 05/26/15   Boykin Nearing, MD  predniSONE (DELTASONE) 50 MG tablet Take 1 tablet (50 mg total) by mouth daily with breakfast. For 5 days 08/24/15   Boykin Nearing, MD  SYMBICORT 80-4.5 MCG/ACT inhaler INHALE 2 PUFFS INTO THE LUNGS 2 TIMES DAILY 09/12/15   Boykin Nearing, MD  triamcinolone cream (KENALOG) 0.1 % Apply 1 application topically 2 (two) times daily. Apply to affected areas on hands and arms 08/23/14   Boykin Nearing, MD  varenicline (CHANTIX CONTINUING MONTH PAK) 1 MG tablet Take 1 tablet (1 mg total) by mouth 2 (two) times daily. 08/24/15   Josalyn Funches, MD  varenicline (CHANTIX STARTING MONTH PAK) 0.5 MG X 11 & 1 MG X 42 tablet Taper per packet insert 08/24/15   Josalyn Funches, MD  VENTOLIN HFA 108 (90 BASE) MCG/ACT inhaler INHALE 2 PUFFS INTO THE LUNGS EVERY 6 HOURS AS NEEDED FOR WHEEZING OR SHORTNESS OF BREATH 09/12/15   Olugbemiga E Jegede, MD   BP 133/84 mmHg  Pulse 102  Temp(Src) 98.3 F (36.8 C) (Oral)  Resp 17  SpO2 75% Physical Exam  Constitutional: He is oriented to person, place, and time. He appears well-developed  and well-nourished.  Non-toxic appearance. No distress.  HENT:  Head: Normocephalic and atraumatic.  Eyes: Conjunctivae, EOM and lids are normal. Pupils are equal, round, and reactive to light.  Neck: Normal range of motion. Neck supple. No tracheal deviation present. No thyroid mass present.  Cardiovascular: Normal rate, regular rhythm and normal heart sounds.  Exam reveals no gallop.   No murmur heard. Pulmonary/Chest: Effort normal. No stridor. No respiratory distress. He has decreased breath sounds. He has wheezes. He has no rhonchi. He has no rales.  Abdominal: Soft. Normal appearance and bowel sounds are normal. He exhibits no distension. There is no tenderness. There is no rebound  and no CVA tenderness.  Musculoskeletal: Normal range of motion. He exhibits no edema or tenderness.  3+ bilateral lower extremity pitting edema  Neurological: He is alert and oriented to person, place, and time. He has normal strength. No cranial nerve deficit or sensory deficit. GCS eye subscore is 4. GCS verbal subscore is 5. GCS motor subscore is 6.  Skin: Skin is warm and dry. No abrasion and no rash noted.  Psychiatric: He has a normal mood and affect. His speech is normal and behavior is normal.  Nursing note and vitals reviewed.   ED Course  Procedures (including critical care time) Labs Review Labs Reviewed  BRAIN NATRIURETIC PEPTIDE  CBC WITH DIFFERENTIAL/PLATELET  COMPREHENSIVE METABOLIC PANEL  TROPONIN I    Imaging Review No results found. I have personally reviewed and evaluated these images and lab results as part of my medical decision-making.   EKG Interpretation   Date/Time:  Saturday September 16 2015 13:01:31 EST Ventricular Rate:  93 PR Interval:  187 QRS Duration: 118 QT Interval:  390 QTC Calculation: 485 R Axis:   -129 Text Interpretation:  Sinus rhythm Biatrial enlargement IRBBB and LPFB  Borderline ST elevation, lateral leads No significant change since last  tracing Confirmed by Braeden Kennan  MD, Keyunna Coco ([id number]) on 09/16/2015 1:21:39 PM      MDM   Final diagnoses:  None   patient's oxygen increased by me to 5 L and O2 sats are stable.  Patient given albuterol with Atrovent & Medrol here. His EKG is unchanged. Elevated troponin noted. Denies any chest pain at this time. We'll admit for ACS workup as well as COPD exacerbation  CRITICAL CARE Performed by: Leota Jacobsen Total critical care time: 50 minutes Critical care time was exclusive of separately billable procedures and treating other patients. Critical care was necessary to treat or prevent imminent or life-threatening deterioration. Critical care was time spent personally by me on the  following activities: development of treatment plan with patient and/or surrogate as well as nursing, discussions with consultants, evaluation of patient's response to treatment, examination of patient, obtaining history from patient or surrogate, ordering and performing treatments and interventions, ordering and review of laboratory studies, ordering and review of radiographic studies, pulse oximetry and re-evaluation of patient's condition.     Lacretia Leigh, MD 09/16/15 1340

## 2015-09-16 NOTE — ED Notes (Signed)
RN drawing labs 

## 2015-09-16 NOTE — ED Notes (Signed)
He c/o persistent shortness of breath with some bilat. L.e. Swelling x ~ 1 week.  He is mildly short of breath and in no distress.  He denies any pain per se, but states he is "uncomfortable because my breath is short" [sic].  His skin is normal, warm and dry.  EKG performed.

## 2015-09-17 ENCOUNTER — Other Ambulatory Visit (HOSPITAL_COMMUNITY): Payer: 59

## 2015-09-17 ENCOUNTER — Inpatient Hospital Stay (HOSPITAL_COMMUNITY): Payer: 59

## 2015-09-17 ENCOUNTER — Encounter (HOSPITAL_COMMUNITY): Payer: Self-pay | Admitting: *Deleted

## 2015-09-17 DIAGNOSIS — R402434 Glasgow coma scale score 3-8, 24 hours or more after hospital admission: Secondary | ICD-10-CM

## 2015-09-17 DIAGNOSIS — N179 Acute kidney failure, unspecified: Secondary | ICD-10-CM | POA: Insufficient documentation

## 2015-09-17 DIAGNOSIS — J9602 Acute respiratory failure with hypercapnia: Secondary | ICD-10-CM

## 2015-09-17 DIAGNOSIS — J441 Chronic obstructive pulmonary disease with (acute) exacerbation: Secondary | ICD-10-CM

## 2015-09-17 LAB — BLOOD GAS, ARTERIAL
ACID-BASE EXCESS: 5.7 mmol/L — AB (ref 0.0–2.0)
ACID-BASE EXCESS: 6.3 mmol/L — AB (ref 0.0–2.0)
Bicarbonate: 36.7 mEq/L — ABNORMAL HIGH (ref 20.0–24.0)
Bicarbonate: 37.4 mEq/L — ABNORMAL HIGH (ref 20.0–24.0)
DELIVERY SYSTEMS: POSITIVE
Delivery systems: POSITIVE
Drawn by: 235321
Drawn by: 422461
EXPIRATORY PAP: 5
EXPIRATORY PAP: 5
FIO2: 0.3
FIO2: 0.35
INSPIRATORY PAP: 18
Inspiratory PAP: 18
Mode: POSITIVE
Mode: POSITIVE
O2 SAT: 90.5 %
O2 Saturation: 84.1 %
PCO2 ART: 81.2 mmHg — AB (ref 35.0–45.0)
PH ART: 7.274 — AB (ref 7.350–7.450)
PO2 ART: 59.1 mmHg — AB (ref 80.0–100.0)
PO2 ART: 68.5 mmHg — AB (ref 80.0–100.0)
Patient temperature: 97.8
Patient temperature: 98.7
RATE: 28 resp/min
RATE: 28 resp/min
TCO2: 32.2 mmol/L (ref 0–100)
TCO2: 32.9 mmol/L (ref 0–100)
pCO2 arterial: 84.4 mmHg (ref 35.0–45.0)
pH, Arterial: 7.269 — ABNORMAL LOW (ref 7.350–7.450)

## 2015-09-17 LAB — RENAL FUNCTION PANEL
ALBUMIN: 3.3 g/dL — AB (ref 3.5–5.0)
Anion gap: 7 (ref 5–15)
BUN: 23 mg/dL — AB (ref 6–20)
CALCIUM: 8.1 mg/dL — AB (ref 8.9–10.3)
CHLORIDE: 93 mmol/L — AB (ref 101–111)
CO2: 35 mmol/L — ABNORMAL HIGH (ref 22–32)
CREATININE: 1.23 mg/dL (ref 0.61–1.24)
Glucose, Bld: 121 mg/dL — ABNORMAL HIGH (ref 65–99)
PHOSPHORUS: 6 mg/dL — AB (ref 2.5–4.6)
Potassium: 5.4 mmol/L — ABNORMAL HIGH (ref 3.5–5.1)
Sodium: 135 mmol/L (ref 135–145)

## 2015-09-17 LAB — CBC WITH DIFFERENTIAL/PLATELET
BASOS ABS: 0 10*3/uL (ref 0.0–0.1)
BASOS PCT: 0 %
EOS ABS: 0 10*3/uL (ref 0.0–0.7)
EOS PCT: 0 %
HCT: 51.9 % (ref 39.0–52.0)
Hemoglobin: 16.7 g/dL (ref 13.0–17.0)
Lymphocytes Relative: 9 %
Lymphs Abs: 0.6 10*3/uL — ABNORMAL LOW (ref 0.7–4.0)
MCH: 31.6 pg (ref 26.0–34.0)
MCHC: 32.2 g/dL (ref 30.0–36.0)
MCV: 98.3 fL (ref 78.0–100.0)
MONO ABS: 0.2 10*3/uL (ref 0.1–1.0)
Monocytes Relative: 3 %
Neutro Abs: 6.3 10*3/uL (ref 1.7–7.7)
Neutrophils Relative %: 88 %
PLATELETS: 238 10*3/uL (ref 150–400)
RBC: 5.28 MIL/uL (ref 4.22–5.81)
RDW: 14.6 % (ref 11.5–15.5)
WBC: 7.2 10*3/uL (ref 4.0–10.5)

## 2015-09-17 LAB — RAPID URINE DRUG SCREEN, HOSP PERFORMED
Amphetamines: NOT DETECTED
Barbiturates: NOT DETECTED
Benzodiazepines: NOT DETECTED
Cocaine: POSITIVE — AB
Opiates: NOT DETECTED
Tetrahydrocannabinol: NOT DETECTED

## 2015-09-17 LAB — GLUCOSE, CAPILLARY: GLUCOSE-CAPILLARY: 140 mg/dL — AB (ref 65–99)

## 2015-09-17 LAB — TROPONIN I: Troponin I: 0.22 ng/mL — ABNORMAL HIGH (ref <0.031)

## 2015-09-17 LAB — MAGNESIUM: MAGNESIUM: 2.2 mg/dL (ref 1.7–2.4)

## 2015-09-17 LAB — PROCALCITONIN

## 2015-09-17 MED ORDER — SODIUM CHLORIDE 0.9 % IV BOLUS (SEPSIS)
1000.0000 mL | Freq: Once | INTRAVENOUS | Status: AC
  Administered 2015-09-17: 1000 mL via INTRAVENOUS

## 2015-09-17 MED ORDER — SODIUM POLYSTYRENE SULFONATE 15 GM/60ML PO SUSP
30.0000 g | Freq: Once | ORAL | Status: AC
  Administered 2015-09-17: 30 g via ORAL
  Filled 2015-09-17: qty 120

## 2015-09-17 MED ORDER — BUDESONIDE 0.25 MG/2ML IN SUSP
0.2500 mg | Freq: Two times a day (BID) | RESPIRATORY_TRACT | Status: DC
  Administered 2015-09-17 – 2015-09-20 (×7): 0.25 mg via RESPIRATORY_TRACT
  Filled 2015-09-17 (×7): qty 2

## 2015-09-17 MED ORDER — NALOXONE HCL 0.4 MG/ML IJ SOLN
INTRAMUSCULAR | Status: AC
  Administered 2015-09-17: 0.4 mg
  Filled 2015-09-17: qty 1

## 2015-09-17 MED ORDER — FUROSEMIDE 40 MG PO TABS
40.0000 mg | ORAL_TABLET | Freq: Every day | ORAL | Status: DC
  Administered 2015-09-18 – 2015-09-19 (×2): 40 mg via ORAL
  Filled 2015-09-17 (×2): qty 1

## 2015-09-17 MED ORDER — TIOTROPIUM BROMIDE MONOHYDRATE 18 MCG IN CAPS
18.0000 ug | ORAL_CAPSULE | Freq: Every day | RESPIRATORY_TRACT | Status: DC
  Administered 2015-09-17 – 2015-09-19 (×3): 18 ug via RESPIRATORY_TRACT
  Filled 2015-09-17: qty 5

## 2015-09-17 MED ORDER — SODIUM CHLORIDE 0.9 % IV SOLN: INTRAVENOUS | Status: DC | PRN

## 2015-09-17 MED ORDER — NALOXONE HCL 0.4 MG/ML IJ SOLN
0.4000 mg | Freq: Once | INTRAMUSCULAR | Status: AC
  Administered 2015-09-17: 0.4 mg via INTRAVENOUS
  Filled 2015-09-17: qty 1

## 2015-09-17 MED ORDER — PANTOPRAZOLE SODIUM 40 MG PO TBEC
40.0000 mg | DELAYED_RELEASE_TABLET | Freq: Every day | ORAL | Status: DC
  Administered 2015-09-17 – 2015-09-19 (×3): 40 mg via ORAL
  Filled 2015-09-17 (×3): qty 1

## 2015-09-17 MED ORDER — NALOXONE HCL 0.4 MG/ML IJ SOLN: 0.4000 mg | INTRAMUSCULAR | Status: DC | PRN

## 2015-09-17 MED ORDER — FUROSEMIDE 10 MG/ML IJ SOLN
40.0000 mg | Freq: Once | INTRAMUSCULAR | Status: AC
  Administered 2015-09-17: 40 mg via INTRAVENOUS
  Filled 2015-09-17: qty 4

## 2015-09-17 MED ORDER — METHYLPREDNISOLONE SODIUM SUCC 40 MG IJ SOLR
40.0000 mg | Freq: Three times a day (TID) | INTRAMUSCULAR | Status: DC
  Administered 2015-09-17 – 2015-09-18 (×3): 40 mg via INTRAVENOUS
  Filled 2015-09-17 (×3): qty 1

## 2015-09-17 MED ORDER — ARFORMOTEROL TARTRATE 15 MCG/2ML IN NEBU
15.0000 ug | INHALATION_SOLUTION | Freq: Two times a day (BID) | RESPIRATORY_TRACT | Status: DC
  Administered 2015-09-17 – 2015-09-20 (×7): 15 ug via RESPIRATORY_TRACT
  Filled 2015-09-17 (×7): qty 2

## 2015-09-17 NOTE — Progress Notes (Signed)
[facility] PROGRESS NOTE  Ricardo Burch [id number] DOB: 05-May-1961 DOA: 09/16/2015 PCP: [name], MD  Assessment/Plan: 1. ACUTE respiratory failure from hypoxia and hypercarbia from copd exacerbation and Mild CHF exacerbation: Admitted to step down and required BIPAP overnight for CO2 narcosis and acute encephalopathy.  He was on BIPAP till 7 45 am and has been on [place] oxygen 2 lit without any issues.  PCCm was consulted and bronchodilators changed.  Solumedrol dose changed.  Resume nicotine patch.  Echocardiogram ordered and pending.   2. Hyperkalemia: Kayexalate rodered, repeat K inam.   3. Mild acute on chronic diastolic CHF: Lasix daily,  Echo, elevated troponins from CHF and possibly cocaine use.    4. ARF: IMPROVED with diuresis, US renal negative.  UA negative. Urine sodium is 30 and creatinine is 65.        Code Status: full code.  Family Communication: none at bedside Disposition Plan: transfer to telemetry when bed avaialble.  Plan for d/c when able to wean him of oxygen    Consultants:  PCCM.   Procedures:  none  Antibiotics:  None.   HPI/Subjective: No new complaints. Denies chest pain or sob.   Objective: Filed Vitals:   09/17/15 1300  BP: 125/99  Pulse: 97  Temp:   Resp: 18    Intake/Output Summary (Last 24 hours) at 09/17/15 1709 Last data filed at 09/17/15 1100  Gross per 24 hour  Intake 1632.76 ml  Output   2275 ml  Net -642.24 ml   Filed Weights   09/16/15 1459 09/17/15 0500  Weight: 117.7 kg (259 lb 7.7 oz) 116.5 kg (256 lb 13.4 oz)    Exam:   General:  Alert conversing.   Cardiovascular: s1s2  Respiratory: no wheezing heard, fair air entry,   Abdomen: soft obese, non tender non distended   Musculoskeletal: pedal edema, no cyanosis or clubbing.   Data Reviewed: Basic Metabolic Panel:  Recent Labs Lab 09/16/15 1140 09/16/15 2130 09/16/15 2300 09/17/15 0320  NA 133* 134* 135 135  K 5.2*  5.5* 5.5* 5.4*  CL 90* 89* 89* 93*  CO2 32 36* 37* 35*  GLUCOSE 71 128* 126* 121*  BUN 23* 25* 25* 23*  CREATININE 1.53* 1.34* 1.47* 1.23  CALCIUM 8.5* 8.6* 8.7* 8.1*  MG  --   --  2.3 2.2  PHOS  --   --  7.1* 6.0*   Liver Function Tests:  Recent Labs Lab 09/16/15 1140 09/16/15 2300 09/17/15 0320  AST 46*  --   --   ALT 34  --   --   ALKPHOS 91  --   --   BILITOT 2.2*  --   --   PROT 6.9  --   --   ALBUMIN 3.6 3.7 3.3*   No results for input(s): LIPASE, AMYLASE in the last 168 hours. No results for input(s): AMMONIA in the last 168 hours. CBC:  Recent Labs Lab 09/16/15 1140 09/17/15 0320  WBC 11.6* 7.2  NEUTROABS 7.4 6.3  HGB 17.0 16.7  HCT 53.0* 51.9  MCV 97.2 98.3  PLT 230 238   Cardiac Enzymes:  Recent Labs Lab 09/16/15 1140 09/16/15 1600 09/16/15 2130 09/17/15 0932  TROPONINI 0.51* 0.48* 0.46* 0.22*   BNP (last 3 results)  Recent Labs  09/16/15 1140  BNP 1091.5*    ProBNP (last 3 results) No results for input(s): PROBNP in the last 8760 hours.  CBG:  Recent Labs Lab 09/16/15 2126  GLUCAP 140*  Recent Results (from the past 240 hour(s))  MRSA PCR Screening     Status: None   Collection Time: 09/16/15  2:50 PM  Result Value Ref Range Status   MRSA by PCR NEGATIVE NEGATIVE Final    Comment:        The GeneXpert MRSA Assay (FDA approved for NASAL specimens only), is one component of a comprehensive MRSA colonization surveillance program. It is not intended to diagnose MRSA infection nor to guide or monitor treatment for MRSA infections.      Studies: Dg Chest 2 View  09/16/2015  CLINICAL DATA:  Pt states SOB x 3-4 days. Pt states he "could hardly walk anymore". Hx COPD EXAM: CHEST  2 VIEW COMPARISON:  08/24/2015 FINDINGS: Lungs are hyperinflated. Heart is enlarged and stable in configuration. There are no focal consolidations or pleural effusions. Old rib fractures are noted. There are degenerative changes in the mid thoracic  spine. IMPRESSION: 1. Hyperinflation. 2. Stable cardiomegaly. Electronically Signed   By: Nolon Nations M.D.   On: 09/16/2015 11:55   Ct Head Wo Contrast  09/17/2015  CLINICAL DATA:  Altered mental status EXAM: CT HEAD WITHOUT CONTRAST TECHNIQUE: Contiguous axial images were obtained from the base of the skull through the vertex without intravenous contrast. COMPARISON:  None. FINDINGS: Multiple scans were acquired due to repeated patient motion. Skull and Sinuses:Negative for fracture or destructive process. The mastoids, middle ears, and imaged paranasal sinuses are clear. Orbits: Symmetric proptosis without extraocular muscle enlargement or mass. Brain: No evidence of acute infarction, hemorrhage, hydrocephalus, or mass lesion/mass effect. IMPRESSION: Negative motion degraded head CT. Electronically Signed   By: Monte Fantasia M.D.   On: 09/17/2015 02:54   US Renal Port  09/17/2015  CLINICAL DATA:  54 year old male with acute renal failure. EXAM: RENAL / URINARY TRACT ULTRASOUND COMPLETE COMPARISON:  None. FINDINGS: Right Kidney: Length: 12.3 cm. Echogenicity within normal limits. No mass or hydronephrosis visualized. Left Kidney: Length: 12.4 cm. Echogenicity within normal limits. No mass or hydronephrosis visualized. Bladder: Appears normal for degree of bladder distention. IMPRESSION: Unremarkable renal ultrasound. Electronically Signed   By: Margarette Canada M.D.   On: 09/17/2015 10:08    Scheduled Meds: . antiseptic oral rinse  7 mL Mouth Rinse BID  . arformoterol  15 mcg Nebulization BID  . aspirin  325 mg Oral Daily  . budesonide (PULMICORT) nebulizer solution  0.25 mg Nebulization BID  . heparin subcutaneous  5,000 Units Subcutaneous 3 times per day  . methylPREDNISolone (SOLU-MEDROL) injection  40 mg Intravenous Q8H  . nicotine  21 mg Transdermal Daily  . pantoprazole  40 mg Oral QHS  . tiotropium  18 mcg Inhalation Daily   Continuous Infusions:   Active Problems:   COPD (chronic  obstructive pulmonary disease) ([facility])   Acute respiratory failure ([place])    Time spent: 25 minutes.    [facility] Pager [phone] . If 7PM-7AM, please contact night-coverage at [email or web address], password [facility] 09/17/2015, 5:09 PM  LOS: 1 day

## 2015-09-17 NOTE — Progress Notes (Signed)
Patient had critical CO2 of 90.1 at 2146, Dr Ashok Cordia was in pt room and was alerted; At 2346 CO2 was 84.4 respiratory therapist called Dr Oletta Darter and reported it. At 0406 CO2 was 81.2 respiratory therapist called Dr Oletta Darter and reported it.

## 2015-09-17 NOTE — Progress Notes (Signed)
[facility] Progress Note Patient Name: Ricardo Burch DOB: October 27, 1961 MRN: [id number]   Date of Service  09/17/2015  HPI/Events of Note  Hypotension - BP = 84/57. Precedex IV infusion has been off since just before midnight. Patient has also been given Lasix and has diuresed about 1200 mL.   eICU Interventions  Will order: 1. 0.9 NaCl 1 L IV over 1 hour now.      Intervention Category Major Interventions: Hypotension - evaluation and management  Sommer,Steven Eugene 09/17/2015, 12:37 AM

## 2015-09-17 NOTE — Progress Notes (Signed)
Interim Progress Note  Called to bedside at approximately 0145 for recurrent unresponsiveness.  Per report, patient had been rousable (and somewhat combative) around midnight after ~3hrs on Bipap.  Within the last hour however, he is completely unresponsive again.  Last ABG showed pCO2 84 from >90.  Had had transient hypotension, now s/p 1L NS bolus per E-link.  BP 110/78 mmHg  Pulse 82  Temp(Src) 97.8 F (36.6 C) (Oral)  Resp 19  Ht 5\' 7"  (1.702 m)  Wt 117.7 kg (259 lb 7.7 oz)  BMI 40.63 kg/m2  SpO2 95% Patient unresponsive on Bipap.  No response to sternal rub or nailbed pressure.  Eyes fixed.  Pupils pinpoint and minimally reactive, equal bilaterally.  Chest sounds deep and coarse with expiration, no wheezes.  Heart tachycardic, regular.  Belly soft.  Legs with trace pitting, I believe.    Assessment: Acute fluctuating mental status. Hypercarbia is profound and admission HCO3 is normal, suggesting this is acute.  Will obtain CT head without contrast.  Will continue Bipap and bronchodilators.  Repeat ABG at 0400.  Hemodynamically stable at this time.  Note: One time administration of naloxone had an immediate response of agitation and patient making eye contact, per nursing.  Continue naloxone PRN and transfer to high level of nursing care.  UDS.    [place]

## 2015-09-17 NOTE — Progress Notes (Signed)
Pt transported to CT scan and back while on BiPAP.  Transport was uneventful and Pt remained stable on BiPAP throughout the trip.

## 2015-09-17 NOTE — Progress Notes (Signed)
Half a bottle of Precedex wasted with Baldomero Lamy, RN.

## 2015-09-17 NOTE — Consult Note (Signed)
PULMONARY / CRITICAL CARE MEDICINE   Name: Keontaye Barbato MRN: [id number] DOB: 1961/05/19    ADMISSION DATE:  09/16/2015 CONSULTATION DATE:  09/16/2015  REFERRING MD :  [facility]  CHIEF COMPLAINT:  Short of breath  SUBJECTIVE:  Feels better.  Doesn't remember what happened last night.  Denies chest or abdominal pain.  VITAL SIGNS: BP 121/85 mmHg  Pulse 85  Temp(Src) 97.9 F (36.6 C) (Oral)  Resp 24  Ht 5\' 7"  (1.702 m)  Wt 256 lb 13.4 oz (116.5 kg)  BMI 40.22 kg/m2  SpO2 96%  INTAKE / OUTPUT: I/O last 3 completed shifts: In: 1532.8 [P.O.:250; I.V.:32.8; IV Piggyback:1250] Out: 2775 [Urine:2775]  PHYSICAL EXAMINATION: General: pleasant Neuro: alert, follows commands HEENT: BiPAP mask in place Cardiovascular: regular, no murmur Lungs: decreased BS, no wheeze Abdomen: soft, non tender Musculoskeletal: 1+ edema Skin: no rashes  LABS: CMP Latest Ref Rng 09/17/2015 09/16/2015 09/16/2015  Glucose 65 - 99 mg/dL 121(H) 126(H) 128(H)  BUN 6 - 20 mg/dL 23(H) 25(H) 25(H)  Creatinine 0.61 - 1.24 mg/dL 1.23 1.47(H) 1.34(H)  Sodium 135 - 145 mmol/L 135 135 134(L)  Potassium 3.5 - 5.1 mmol/L 5.4(H) 5.5(H) 5.5(H)  Chloride 101 - 111 mmol/L 93(L) 89(L) 89(L)  CO2 22 - 32 mmol/L 35(H) 37(H) 36(H)  Calcium 8.9 - 10.3 mg/dL 8.1(L) 8.7(L) 8.6(L)  Total Protein 6.5 - 8.1 g/dL - - -  Total Bilirubin 0.3 - 1.2 mg/dL - - -  Alkaline Phos 38 - 126 U/L - - -  AST 15 - 41 U/L - - -  ALT 17 - 63 U/L - - -    CBC Latest Ref Rng 09/17/2015 09/16/2015 03/28/2014  WBC 4.0 - 10.5 K/uL 7.2 11.6(H) 11.2(H)  Hemoglobin 13.0 - 17.0 g/dL 16.7 17.0 15.9  Hematocrit 39.0 - 52.0 % 51.9 53.0(H) 49.3  Platelets 150 - 400 K/uL 238 230 246    ABG    Component Value Date/Time   PHART 7.274* 09/17/2015 0406   PCO2ART 81.2* 09/17/2015 0406   PO2ART 68.5* 09/17/2015 0406   HCO3 36.7* 09/17/2015 0406   TCO2 32.2 09/17/2015 0406   O2SAT 90.5 09/17/2015 0406    Lab Results  Component Value  Date   TSH 0.668 09/16/2015   Drugs of Abuse     Component Value Date/Time   LABOPIA NONE DETECTED 09/17/2015 0238   COCAINSCRNUR POSITIVE* 09/17/2015 0238   LABBENZ NONE DETECTED 09/17/2015 0238   AMPHETMU NONE DETECTED 09/17/2015 0238   THCU NONE DETECTED 09/17/2015 0238   LABBARB NONE DETECTED 09/17/2015 0238    IMAGING Dg Chest 2 View  09/16/2015  CLINICAL DATA:  Pt states SOB x 3-4 days. Pt states he "could hardly walk anymore". Hx COPD EXAM: CHEST  2 VIEW COMPARISON:  08/24/2015 FINDINGS: Lungs are hyperinflated. Heart is enlarged and stable in configuration. There are no focal consolidations or pleural effusions. Old rib fractures are noted. There are degenerative changes in the mid thoracic spine. IMPRESSION: 1. Hyperinflation. 2. Stable cardiomegaly. Electronically Signed   By: Nolon Nations M.D.   On: 09/16/2015 11:55   Ct Head Wo Contrast  09/17/2015  CLINICAL DATA:  Altered mental status EXAM: CT HEAD WITHOUT CONTRAST TECHNIQUE: Contiguous axial images were obtained from the base of the skull through the vertex without intravenous contrast. COMPARISON:  None. FINDINGS: Multiple scans were acquired due to repeated patient motion. Skull and Sinuses:Negative for fracture or destructive process. The mastoids, middle ears, and imaged paranasal sinuses are clear. Orbits: Symmetric proptosis without  extraocular muscle enlargement or mass. Brain: No evidence of acute infarction, hemorrhage, hydrocephalus, or mass lesion/mass effect. IMPRESSION: Negative motion degraded head CT. Electronically Signed   By: Monte Fantasia M.D.   On: 09/17/2015 02:54   CULTURES: Blood 11/12 >> Respiratory viral panel 11/12 >>  STUDIES:  11/13 CT head >> negative; motion artifact 11/13 Echo >>   SIGNIFICANT EVENTS: 11/12 Admit & obtunded; BiPAP, narcan  DISCUSSION: 54 yo male smoker presented with progressive dyspnea and leg edema for one week.  He was admitted with AECOPD and diastolic CHF.   Became obtunded and PCCM consulted.  UDS positive for cocaine.  ASSESSMENT / PLAN:    Acute hypoxic/hypercapnic respiratory failure in setting of AECOPD with UDS positive for cocaine. Plan: - BiPAP prn - oxygen to keep SpO2 90 to 95% - change solumedrol to 40 mg q8h, and wean off as tolerated - pulmicort, brovana, spiriva - prn albuterol - f/u CXR intermittently - bronchial hygiene - procalcitonin negative >> d/c doxycycline  Acute encephalopathy 2nd to hypercapnia. He reportedly had improvement after narcan, but UDS negative for opiates. Plan: - monitor mental status  Tobacco abuse. Plan: - nicotine patch - smoking cessation  Acute on chronic diastolic CHF. Plan: - continue lasix - f/u Echo  AKI >> improving. Hyperkalemia. Plan: - f/u BMET - monitor renal fx, urine outpt  Hx of ETOH >> no evidence for withdrawal at this time. Plan: - monitor - d/c precedex  Nutrition. Plan: - advance diet once he is off BiPAP  SQ heparin for DVT prophylaxis Protonix for SUP while on high dose steroids   Chesley Mires, MD [place] 09/17/2015, 8:03 AM Pager:  [phone] After 3pm call: [phone]

## 2015-09-17 NOTE — Progress Notes (Signed)
[facility] Progress Note Patient Name: Ricardo Burch DOB: July 12, 1961 MRN: [id number]   Date of Service  09/17/2015  HPI/Events of Note  ABG on BiPAP 30%/IPAP 18/EPAP 5 = 7.26/84.4/59.1/37.4. ABG suggests that his pCO2 is normally in the 70's.  eICU Interventions  Will recheck ABG at 5 AM.     Intervention Category Major Interventions: Respiratory failure - evaluation and management  Maudine Kluesner Eugene 09/17/2015, 12:10 AM

## 2015-09-17 NOTE — Progress Notes (Signed)
Volumes noted to be in the 216mL range with set rate 28 IPAP 18 EPAP 5. Settings titrated post ABG result to IPAP 24 EPAP 6 rate 24. Volumes increased to 605-674mL. Dr. Oletta Darter aware. RT will continue to follow.

## 2015-09-18 ENCOUNTER — Inpatient Hospital Stay (HOSPITAL_COMMUNITY): Payer: 59

## 2015-09-18 DIAGNOSIS — R0600 Dyspnea, unspecified: Secondary | ICD-10-CM

## 2015-09-18 LAB — BASIC METABOLIC PANEL
ANION GAP: 6 (ref 5–15)
BUN: 23 mg/dL — AB (ref 6–20)
CHLORIDE: 90 mmol/L — AB (ref 101–111)
CO2: 37 mmol/L — AB (ref 22–32)
Calcium: 8 mg/dL — ABNORMAL LOW (ref 8.9–10.3)
Creatinine, Ser: 1.11 mg/dL (ref 0.61–1.24)
GFR calc Af Amer: >60 mL/min (ref >60)
GLUCOSE: 105 mg/dL — AB (ref 65–99)
POTASSIUM: 4.7 mmol/L (ref 3.5–5.1)
Sodium: 133 mmol/L — ABNORMAL LOW (ref 135–145)

## 2015-09-18 LAB — BRAIN NATRIURETIC PEPTIDE: B Natriuretic Peptide: 359.1 pg/mL — ABNORMAL HIGH (ref 0.0–100.0)

## 2015-09-18 LAB — HEMOGLOBIN A1C
Hgb A1c MFr Bld: 6.2 % — ABNORMAL HIGH (ref 4.8–5.6)
MEAN PLASMA GLUCOSE: 131 mg/dL

## 2015-09-18 MED ORDER — PREDNISONE 50 MG PO TABS
60.0000 mg | ORAL_TABLET | Freq: Every day | ORAL | Status: DC
  Administered 2015-09-19 – 2015-09-20 (×2): 60 mg via ORAL
  Filled 2015-09-18: qty 1
  Filled 2015-09-18: qty 3

## 2015-09-18 NOTE — Progress Notes (Signed)
[facility] PROGRESS NOTE  Ricardo Burch [id number] DOB: 05/22/1961 DOA: 09/16/2015 PCP: [name], MD  Assessment/Plan: 1. ACUTE respiratory failure from hypoxia and hypercarbia from copd exacerbation and Mild CHF exacerbation: Admitted to step down and required BIPAP the night on 11/12 for CO2 narcosis and acute encephalopathy.  He was on BIPAP till 7 45 am and has been on  oxygen 2 lit without any issues.  PCCm was consulted and bronchodilators changed. Solumedrol changed to prednisone.  Resume nicotine patch.  Echocardiogram ordered , showed moderate pulmonary hypertension.   2. Hyperkalemia:  kayexalate given, and repeat K in normal.   3. Mild acute on chronic diastolic CHF: Lasix daily,  Echo, elevated troponins from CHF and possibly cocaine use.  Good diuresis.    4. ARF: IMPROVED with diuresis, US renal negative. Resolved.  UA negative. Urine sodium is 30 and creatinine is 65.      Code Status: full code.  Family Communication: none at bedside Disposition Plan: transfer to telemetry when bed avaialble.  Plan for d/c when able to wean him of oxygen    Consultants:  PCCM.   Procedures:  none  Antibiotics:  None.   HPI/Subjective: No new complaints. Denies chest pain or sob. Wants to know when he can go home.   Objective: Filed Vitals:   09/18/15 1700  BP: 114/73  Pulse: 101  Temp:   Resp: 24    Intake/Output Summary (Last 24 hours) at 09/18/15 1746 Last data filed at 09/18/15 1700  Gross per 24 hour  Intake   1160 ml  Output   1475 ml  Net   -315 ml   Filed Weights   09/16/15 1459 09/17/15 0500 09/18/15 0442  Weight: 117.7 kg (259 lb 7.7 oz) 116.5 kg (256 lb 13.4 oz) 116.2 kg (256 lb 2.8 oz)    Exam:   General:  Alert conversing.   Cardiovascular: s1s2  Respiratory: no wheezing heard, fair air entry,   Abdomen: soft obese, non tender non distended   Musculoskeletal: pedal edema, no cyanosis or clubbing.   Data  Reviewed: Basic Metabolic Panel:  Recent Labs Lab 09/16/15 1140 09/16/15 2130 09/16/15 2300 09/17/15 0320 09/18/15 0345  NA 133* 134* 135 135 133*  K 5.2* 5.5* 5.5* 5.4* 4.7  CL 90* 89* 89* 93* 90*  CO2 32 36* 37* 35* 37*  GLUCOSE 71 128* 126* 121* 105*  BUN 23* 25* 25* 23* 23*  CREATININE 1.53* 1.34* 1.47* 1.23 1.11  CALCIUM 8.5* 8.6* 8.7* 8.1* 8.0*  MG  --   --  2.3 2.2  --   PHOS  --   --  7.1* 6.0*  --    Liver Function Tests:  Recent Labs Lab 09/16/15 1140 09/16/15 2300 09/17/15 0320  AST 46*  --   --   ALT 34  --   --   ALKPHOS 91  --   --   BILITOT 2.2*  --   --   PROT 6.9  --   --   ALBUMIN 3.6 3.7 3.3*   No results for input(s): LIPASE, AMYLASE in the last 168 hours. No results for input(s): AMMONIA in the last 168 hours. CBC:  Recent Labs Lab 09/16/15 1140 09/17/15 0320  WBC 11.6* 7.2  NEUTROABS 7.4 6.3  HGB 17.0 16.7  HCT 53.0* 51.9  MCV 97.2 98.3  PLT 230 238   Cardiac Enzymes:  Recent Labs Lab 09/16/15 1140 09/16/15 1600 09/16/15 2130 09/17/15 0932  TROPONINI 0.51* 0.48* 0.46*  0.22*   BNP (last 3 results)  Recent Labs  09/16/15 1140 09/18/15 0500  BNP 1091.5* 359.1*    ProBNP (last 3 results) No results for input(s): PROBNP in the last 8760 hours.  CBG:  Recent Labs Lab 09/16/15 2126  GLUCAP 140*    Recent Results (from the past 240 hour(s))  MRSA PCR Screening     Status: None   Collection Time: 09/16/15  2:50 PM  Result Value Ref Range Status   MRSA by PCR NEGATIVE NEGATIVE Final    Comment:        The GeneXpert MRSA Assay (FDA approved for NASAL specimens only), is one component of a comprehensive MRSA colonization surveillance program. It is not intended to diagnose MRSA infection nor to guide or monitor treatment for MRSA infections.   Culture, blood (routine x 2)     Status: None (Preliminary result)   Collection Time: 09/16/15 11:00 PM  Result Value Ref Range Status   Specimen Description BLOOD LEFT  ARM  Final   Special Requests BOTTLES DRAWN AEROBIC ONLY [place]  Final   Culture   Final    NO GROWTH 1 DAY Performed at [facility]    Report Status PENDING  Incomplete  Culture, blood (routine x 2)     Status: None (Preliminary result)   Collection Time: 09/16/15 11:05 PM  Result Value Ref Range Status   Specimen Description BLOOD LEFT HAND  Final   Special Requests IN PEDIATRIC BOTTLE 4CC  Final   Culture   Final    NO GROWTH 1 DAY Performed at [facility]    Report Status PENDING  Incomplete     Studies: Ct Head Wo Contrast  09/17/2015  CLINICAL DATA:  Altered mental status EXAM: CT HEAD WITHOUT CONTRAST TECHNIQUE: Contiguous axial images were obtained from the base of the skull through the vertex without intravenous contrast. COMPARISON:  None. FINDINGS: Multiple scans were acquired due to repeated patient motion. Skull and Sinuses:Negative for fracture or destructive process. The mastoids, middle ears, and imaged paranasal sinuses are clear. Orbits: Symmetric proptosis without extraocular muscle enlargement or mass. Brain: No evidence of acute infarction, hemorrhage, hydrocephalus, or mass lesion/mass effect. IMPRESSION: Negative motion degraded head CT. Electronically Signed   By: Monte Fantasia M.D.   On: 09/17/2015 02:54   US Renal Port  09/17/2015  CLINICAL DATA:  54 year old male with acute renal failure. EXAM: RENAL / URINARY TRACT ULTRASOUND COMPLETE COMPARISON:  None. FINDINGS: Right Kidney: Length: 12.3 cm. Echogenicity within normal limits. No mass or hydronephrosis visualized. Left Kidney: Length: 12.4 cm. Echogenicity within normal limits. No mass or hydronephrosis visualized. Bladder: Appears normal for degree of bladder distention. IMPRESSION: Unremarkable renal ultrasound. Electronically Signed   By: Margarette Canada M.D.   On: 09/17/2015 10:08    Scheduled Meds: . antiseptic oral rinse  7 mL Mouth Rinse BID  . arformoterol  15 mcg Nebulization BID  .  aspirin  325 mg Oral Daily  . budesonide (PULMICORT) nebulizer solution  0.25 mg Nebulization BID  . furosemide  40 mg Oral Daily  . heparin subcutaneous  5,000 Units Subcutaneous 3 times per day  . nicotine  21 mg Transdermal Daily  . pantoprazole  40 mg Oral QHS  . [START ON 09/19/2015] predniSONE  60 mg Oral Q breakfast  . tiotropium  18 mcg Inhalation Daily   Continuous Infusions:   Active Problems:   COPD (chronic obstructive pulmonary disease) ([place])   Acute respiratory failure ([place])  ARF (acute renal failure) ([place])    Time spent: 25 minutes.    [facility] Pager [phone] . If 7PM-7AM, please contact night-coverage at [email or web address], password [facility] 09/18/2015, 5:46 PM  LOS: 2 days

## 2015-09-18 NOTE — Care Management Note (Signed)
Case Management Note  Patient Details  Name: Ricardo Burch MRN: [id number] Date of Birth: Feb 12, 1961  Subjective/Objective:           resp distress         Action/Plan:Date: September 18, 2015 Chart reviewed for concurrent status and case management needs. Will continue to follow patient for changes and needs: Velva Harman, RN, BSN, [place]   [phone]   Expected Discharge Date:                  Expected Discharge Plan:  Home/Self Care  In-House Referral:  NA  Discharge planning Services  CM Consult  Post Acute Care Choice:  NA Choice offered to:  NA  DME Arranged:    DME Agency:     HH Arranged:    HH Agency:     Status of Service:  In process, will continue to follow  Medicare Important Message Given:    Date Medicare IM Given:    Medicare IM give by:    Date Additional Medicare IM Given:    Additional Medicare Important Message give by:     If discussed at [place] of Stay Meetings, dates discussed:    Additional Comments:  Leeroy Cha, RN 09/18/2015, 10:07 AM

## 2015-09-18 NOTE — Consult Note (Signed)
PULMONARY / CRITICAL CARE MEDICINE   Name: Ricardo Burch MRN: [id number] DOB: 13-Feb-1961    ADMISSION DATE:  09/16/2015 CONSULTATION DATE:  09/16/2015  REFERRING MD :  [facility]  CHIEF COMPLAINT:  Short of breath  SUBJECTIVE:  Feels better. Denies dyspnea, chest or abdominal pain. Has been off Bipap since sat.  VITAL SIGNS: BP 104/42 mmHg  Pulse 94  Temp(Src) 98.2 F (36.8 C) (Axillary)  Resp 13  Ht 5\' 7"  (1.702 m)  Wt 256 lb 2.8 oz (116.2 kg)  BMI 40.11 kg/m2  SpO2 96%  INTAKE / OUTPUT: I/O last 3 completed shifts: In: 2592.8 [P.O.:1300; I.V.:32.8; Other:10; IV Piggyback:1250] Out: 2725 [Urine:2725]  PHYSICAL EXAMINATION: General: Awake, No distress Neuro: Cranial nerves intact. No focal deficits.  HEENT: Moist mucus membranes. Cardiovascular: RRR, no MRG Lungs: Scattered crackles, no wheeze Abdomen: soft, non tender Musculoskeletal: 1+ edema Skin: no rashes  LABS: CMP Latest Ref Rng 09/18/2015 09/17/2015 09/16/2015  Glucose 65 - 99 mg/dL 105(H) 121(H) 126(H)  BUN 6 - 20 mg/dL 23(H) 23(H) 25(H)  Creatinine 0.61 - 1.24 mg/dL 1.11 1.23 1.47(H)  Sodium 135 - 145 mmol/L 133(L) 135 135  Potassium 3.5 - 5.1 mmol/L 4.7 5.4(H) 5.5(H)  Chloride 101 - 111 mmol/L 90(L) 93(L) 89(L)  CO2 22 - 32 mmol/L 37(H) 35(H) 37(H)  Calcium 8.9 - 10.3 mg/dL 8.0(L) 8.1(L) 8.7(L)  Total Protein 6.5 - 8.1 g/dL - - -  Total Bilirubin 0.3 - 1.2 mg/dL - - -  Alkaline Phos 38 - 126 U/L - - -  AST 15 - 41 U/L - - -  ALT 17 - 63 U/L - - -    CBC Latest Ref Rng 09/17/2015 09/16/2015 03/28/2014  WBC 4.0 - 10.5 K/uL 7.2 11.6(H) 11.2(H)  Hemoglobin 13.0 - 17.0 g/dL 16.7 17.0 15.9  Hematocrit 39.0 - 52.0 % 51.9 53.0(H) 49.3  Platelets 150 - 400 K/uL 238 230 246    ABG    Component Value Date/Time   PHART 7.274* 09/17/2015 0406   PCO2ART 81.2* 09/17/2015 0406   PO2ART 68.5* 09/17/2015 0406   HCO3 36.7* 09/17/2015 0406   TCO2 32.2 09/17/2015 0406   O2SAT 90.5 09/17/2015 0406     Lab Results  Component Value Date   TSH 0.668 09/16/2015   Drugs of Abuse     Component Value Date/Time   LABOPIA NONE DETECTED 09/17/2015 0238   COCAINSCRNUR POSITIVE* 09/17/2015 0238   LABBENZ NONE DETECTED 09/17/2015 0238   AMPHETMU NONE DETECTED 09/17/2015 0238   THCU NONE DETECTED 09/17/2015 0238   LABBARB NONE DETECTED 09/17/2015 0238    IMAGING Dg Chest 2 View  09/16/2015  CLINICAL DATA:  Pt states SOB x 3-4 days. Pt states he "could hardly walk anymore". Hx COPD EXAM: CHEST  2 VIEW COMPARISON:  08/24/2015 FINDINGS: Lungs are hyperinflated. Heart is enlarged and stable in configuration. There are no focal consolidations or pleural effusions. Old rib fractures are noted. There are degenerative changes in the mid thoracic spine. IMPRESSION: 1. Hyperinflation. 2. Stable cardiomegaly. Electronically Signed   By: Nolon Nations M.D.   On: 09/16/2015 11:55   Ct Head Wo Contrast  09/17/2015  CLINICAL DATA:  Altered mental status EXAM: CT HEAD WITHOUT CONTRAST TECHNIQUE: Contiguous axial images were obtained from the base of the skull through the vertex without intravenous contrast. COMPARISON:  None. FINDINGS: Multiple scans were acquired due to repeated patient motion. Skull and Sinuses:Negative for fracture or destructive process. The mastoids, middle ears, and imaged paranasal sinuses are  clear. Orbits: Symmetric proptosis without extraocular muscle enlargement or mass. Brain: No evidence of acute infarction, hemorrhage, hydrocephalus, or mass lesion/mass effect. IMPRESSION: Negative motion degraded head CT. Electronically Signed   By: Monte Fantasia M.D.   On: 09/17/2015 02:54   US Renal Port  09/17/2015  CLINICAL DATA:  54 year old male with acute renal failure. EXAM: RENAL / URINARY TRACT ULTRASOUND COMPLETE COMPARISON:  None. FINDINGS: Right Kidney: Length: 12.3 cm. Echogenicity within normal limits. No mass or hydronephrosis visualized. Left Kidney: Length: 12.4 cm.  Echogenicity within normal limits. No mass or hydronephrosis visualized. Bladder: Appears normal for degree of bladder distention. IMPRESSION: Unremarkable renal ultrasound. Electronically Signed   By: Margarette Canada M.D.   On: 09/17/2015 10:08   CULTURES: Blood 11/12 >> NGTD Respiratory viral panel 11/12 >> Pending  STUDIES:  11/13 CT head >> negative; motion artifact 11/13 Echo >>   SIGNIFICANT EVENTS: 11/12 Admit & obtunded; BiPAP, narcan  DISCUSSION: 54 yo male smoker presented with progressive dyspnea and leg edema for one week.  He was admitted with AECOPD and diastolic CHF.  Became obtunded and PCCM consulted.  UDS positive for cocaine.  ASSESSMENT / PLAN:    Acute hypoxic/hypercapnic respiratory failure in setting of AECOPD with UDS positive for cocaine. Plan: - BiPAP prn - oxygen to keep SpO2 90 to 95% - change solumedrol to prednisone 60 mg qd, and wean off as tolerated - pulmicort, brovana, spiriva - prn albuterol - bronchial hygiene - procalcitonin negative >> off antibiotics. - He will need pulmonary follow up and sleep study as an outpatient.   Acute encephalopathy 2nd to hypercapnia. He reportedly had improvement after narcan, but UDS negative for opiates. Plan: - monitor mental status  Tobacco abuse. Plan: - nicotine patch - smoking cessation  Acute on chronic diastolic CHF. Plan: - continue lasix - f/u Echo  AKI >> improving. Hyperkalemia. Plan: - f/u BMET - monitor renal fx, urine outpt  Hx of ETOH >> no evidence for withdrawal at this time. Plan: - monitor - d/c precedex  Nutrition. Plan: - advance diet once.  SQ heparin for DVT prophylaxis Protonix for SUP while on high dose steroids   Marshell Garfinkel MD [facility] Pager [phone] If no answer or after 3pm call: [phone] 09/18/2015, 9:11 AM

## 2015-09-18 NOTE — Progress Notes (Signed)
*  PRELIMINARY RESULTS* Echocardiogram 2D Echocardiogram has been performed.  Leavy Cella 09/18/2015, 11:20 AM

## 2015-09-19 DIAGNOSIS — J42 Unspecified chronic bronchitis: Secondary | ICD-10-CM

## 2015-09-19 LAB — BASIC METABOLIC PANEL
Anion gap: 4 — ABNORMAL LOW (ref 5–15)
BUN: 21 mg/dL — ABNORMAL HIGH (ref 6–20)
CO2: 41 mmol/L — ABNORMAL HIGH (ref 22–32)
Calcium: 8.3 mg/dL — ABNORMAL LOW (ref 8.9–10.3)
Chloride: 94 mmol/L — ABNORMAL LOW (ref 101–111)
Creatinine, Ser: 1.31 mg/dL — ABNORMAL HIGH (ref 0.61–1.24)
GFR calc Af Amer: >60 mL/min (ref >60)
GFR calc non Af Amer: >60 mL/min (ref >60)
Glucose, Bld: 93 mg/dL (ref 65–99)
Potassium: 4 mmol/L (ref 3.5–5.1)
Sodium: 139 mmol/L (ref 135–145)

## 2015-09-19 LAB — RESPIRATORY VIRUS PANEL
Adenovirus: NEGATIVE
INFLUENZA A: NEGATIVE
Influenza B: NEGATIVE
Metapneumovirus: NEGATIVE
PARAINFLUENZA 1 A: NEGATIVE
PARAINFLUENZA 2 A: NEGATIVE
PARAINFLUENZA 3 A: NEGATIVE
RESPIRATORY SYNCYTIAL VIRUS B: NEGATIVE
RHINOVIRUS: NEGATIVE
Respiratory Syncytial Virus A: NEGATIVE

## 2015-09-19 NOTE — Progress Notes (Signed)
[facility] PROGRESS NOTE  Ricardo Burch [id number] DOB: 01-02-1961 DOA: 09/16/2015 PCP: [name], MD Brief history: Ricardo Burch is a 54 y.o. male With h/o hypertension, COPD, chronic smoker, presents to ED with worsening sob and leg edema since one week. He was admitted for acute respiratory failure.   Assessment/Plan: 1. ACUTE respiratory failure from hypoxia and hypercarbia from copd exacerbation and Mild CHF exacerbation and OSA: Admitted to step down and required BIPAP the night on 11/12 for CO2 narcosis and acute encephalopathy.  He was on BIPAP till 7 45 am and has been on [place] oxygen 2 lit without any issues.  PCCm was consulted and bronchodilators changed. Solumedrol changed to prednisone to be tapered over the next 2 weeks.  Resume nicotine patch.  Echocardiogram ordered , showed moderate pulmonary hypertension.  Overnight patient desaturated on RA during sleep and he required 35% of oxygen, probably from OSA.  CPAP ordered tonight and he will probably need a split study and a CPAP machine on discharge.   2. Hyperkalemia:  kayexalate given, and repeat K in normal.   3. Mild acute on chronic diastolic CHF: He appears to be compensated, he was started on IV lasix and changed to po lasix 40 mg daily. But as his creatinine worsened, his lasix was discontinued on 11/15.  Echo showed moderate pulmonary hypertension., elevated troponins from CHF and possibly cocaine use.     4. ARF: IMPROVED with diuresis, initially, but his creatinine is back at 1.3. He was started on lasix 40 mg daily, which was discontinued today.  US renal negative.  UA negative. Urine sodium is 30 and creatinine is 65.   Monitor renal parameters.   5 Drug abuse: UDS positive for cocaine.     Code Status: full code.  Family Communication: none at bedside Disposition Plan: transfer to telemetry when bed avaialble.  Plan for d/c when able to wean him of oxygen    Consultants:  PCCM.    Procedures:  none  Antibiotics:  None.   HPI/Subjective: No new complaints. Denies chest pain or sob. But as per RN overnight he desat and required 35% of oxygen.   Objective: Filed Vitals:   09/19/15 1218  BP:   Pulse:   Temp: 98.6 F (37 C)  Resp:     Intake/Output Summary (Last 24 hours) at 09/19/15 1357 Last data filed at 09/19/15 0800  Gross per 24 hour  Intake   1122 ml  Output    850 ml  Net    272 ml   Filed Weights   09/17/15 0500 09/18/15 0442 09/19/15 0621  Weight: 116.5 kg (256 lb 13.4 oz) 116.2 kg (256 lb 2.8 oz) 114.8 kg (253 lb 1.4 oz)    Exam:   General:  Alert conversing currently 4 lit of [place] oxygen.   Cardiovascular: s1s2  Respiratory: no wheezing heard, fair air entry,   Abdomen: soft obese, non tender non distended   Musculoskeletal: pedal edema, no cyanosis or clubbing.   Data Reviewed: Basic Metabolic Panel:  Recent Labs Lab 09/16/15 2130 09/16/15 2300 09/17/15 0320 09/18/15 0345 09/19/15 0407  NA 134* 135 135 133* 139  K 5.5* 5.5* 5.4* 4.7 4.0  CL 89* 89* 93* 90* 94*  CO2 36* 37* 35* 37* 41*  GLUCOSE 128* 126* 121* 105* 93  BUN 25* 25* 23* 23* 21*  CREATININE 1.34* 1.47* 1.23 1.11 1.31*  CALCIUM 8.6* 8.7* 8.1* 8.0* 8.3*  MG  --  2.3 2.2  --   --  PHOS  --  7.1* 6.0*  --   --    Liver Function Tests:  Recent Labs Lab 09/16/15 1140 09/16/15 2300 09/17/15 0320  AST 46*  --   --   ALT 34  --   --   ALKPHOS 91  --   --   BILITOT 2.2*  --   --   PROT 6.9  --   --   ALBUMIN 3.6 3.7 3.3*   No results for input(s): LIPASE, AMYLASE in the last 168 hours. No results for input(s): AMMONIA in the last 168 hours. CBC:  Recent Labs Lab 09/16/15 1140 09/17/15 0320  WBC 11.6* 7.2  NEUTROABS 7.4 6.3  HGB 17.0 16.7  HCT 53.0* 51.9  MCV 97.2 98.3  PLT 230 238   Cardiac Enzymes:  Recent Labs Lab 09/16/15 1140 09/16/15 1600 09/16/15 2130 09/17/15 0932  TROPONINI 0.51* 0.48* 0.46* 0.22*   BNP (last 3  results)  Recent Labs  09/16/15 1140 09/18/15 0500  BNP 1091.5* 359.1*    ProBNP (last 3 results) No results for input(s): PROBNP in the last 8760 hours.  CBG:  Recent Labs Lab 09/16/15 2126  GLUCAP 140*    Recent Results (from the past 240 hour(s))  MRSA PCR Screening     Status: None   Collection Time: 09/16/15  2:50 PM  Result Value Ref Range Status   MRSA by PCR NEGATIVE NEGATIVE Final    Comment:        The GeneXpert MRSA Assay (FDA approved for NASAL specimens only), is one component of a comprehensive MRSA colonization surveillance program. It is not intended to diagnose MRSA infection nor to guide or monitor treatment for MRSA infections.   Culture, blood (routine x 2)     Status: None (Preliminary result)   Collection Time: 09/16/15 11:00 PM  Result Value Ref Range Status   Specimen Description BLOOD LEFT ARM  Final   Special Requests BOTTLES DRAWN AEROBIC ONLY [place]  Final   Culture   Final    NO GROWTH 1 DAY Performed at [facility]    Report Status PENDING  Incomplete  Culture, blood (routine x 2)     Status: None (Preliminary result)   Collection Time: 09/16/15 11:05 PM  Result Value Ref Range Status   Specimen Description BLOOD LEFT HAND  Final   Special Requests IN PEDIATRIC BOTTLE 4CC  Final   Culture   Final    NO GROWTH 1 DAY Performed at [facility]    Report Status PENDING  Incomplete  Respiratory virus panel     Status: None   Collection Time: 09/17/15  8:00 AM  Result Value Ref Range Status   Source - RVPAN NASOPHARYNGEAL SWAB  Corrected   Respiratory Syncytial Virus A Negative Negative Final   Respiratory Syncytial Virus B Negative Negative Final   Influenza A Negative Negative Final   Influenza B Negative Negative Final   Parainfluenza 1 Negative Negative Final   Parainfluenza 2 Negative Negative Final   Parainfluenza 3 Negative Negative Final   Metapneumovirus Negative Negative Final   Rhinovirus Negative  Negative Final   Adenovirus Negative Negative Final    Comment: (NOTE) Performed At: [facility] [place], [place] [id number] Lindon Romp MD [id number]      Studies: No results found.  Scheduled Meds: . antiseptic oral rinse  7 mL Mouth Rinse BID  . arformoterol  15 mcg Nebulization BID  . aspirin  325 mg  Oral Daily  . budesonide (PULMICORT) nebulizer solution  0.25 mg Nebulization BID  . heparin subcutaneous  5,000 Units Subcutaneous 3 times per day  . nicotine  21 mg Transdermal Daily  . pantoprazole  40 mg Oral QHS  . predniSONE  60 mg Oral Q breakfast  . tiotropium  18 mcg Inhalation Daily   Continuous Infusions:   Active Problems:   COPD (chronic obstructive pulmonary disease) ([facility])   Acute respiratory failure ([facility])   ARF (acute renal failure) ([place])    Time spent: 25 minutes.    [place] [facility] Pager [phone] . If 7PM-7AM, please contact night-coverage at [email or web address], password [facility] 09/19/2015, 1:57 PM  LOS: 3 days

## 2015-09-19 NOTE — Progress Notes (Signed)
SATURATION QUALIFICATIONS: (This note is used to comply with regulatory documentation for home oxygen)  Patient Saturations on Room Air at Rest = 85%  Patient Saturations on Room Air while Ambulating = 81%  Patient Saturations on 2 Liters of oxygen while Ambulating = 95%  Please briefly explain why patient needs home oxygen:oxygen desaturation during ambulation [place] PT [phone]

## 2015-09-19 NOTE — Progress Notes (Signed)
Physical Therapy Treatment Patient Details Name: Ricardo Burch MRN: [id number] DOB: 1961-09-28 Today's Date: 09/19/2015    History of Present Illness Ricardo Burch is a 54 y.o. male With h/o hypertension, COPD, chronic smoker, presents to ED 09/16/15 with worsening sob and leg edema since one week.. On arrival to ED, he was hypoxic requiring 50% venti mask, then required BiPAP. Labs revealed renal insufficiency, hyperkalemia, low sodium and elevated BNP, troponin, and AST. HIS CBC shows mild leukocytosis, and normal tsh.     PT Comments    sats are dropping on RA. RN aware. See mobility section and certification note.  Follow Up Recommendations  No PT follow up     Equipment Recommendations  None recommended by PT    Recommendations for Other Services       Precautions / Restrictions Precautions Precaution Comments: monitor sats    Mobility  Bed Mobility Overal bed mobility: Independent                Transfers Overall transfer level: Independent                  Ambulation/Gait Ambulation/Gait assistance: Supervision Ambulation Distance (Feet): 440 Feet Assistive device: None Gait Pattern/deviations: WFL(Within Functional Limits)   Gait velocity interpretation: at or above normal speed for age/gender General Gait Details: sats 89% on 2 l to ambulate 220, then  81% on  RA for ambulation. placed on 3 l when returned to room with sats  up to 95%. removed O2 again with sats falling to 85%. replaced at 2 liters, RN informed.   Stairs            Wheelchair Mobility    Modified Rankin (Stroke Patients Only)       Balance Overall balance assessment: Independent                                  Cognition Arousal/Alertness: Awake/alert Behavior During Therapy: WFL for tasks assessed/performed Overall Cognitive Status: Within Functional Limits for tasks assessed                      Exercises      General Comments         Pertinent Vitals/Pain Pain Assessment: No/denies pain    Home Living Family/patient expects to be discharged to:: Private residence Living Arrangements: Parent;Non-relatives/Friends Available Help at Discharge: Family;Friend(s) Type of Home: House Home Access: Stairs to enter Entrance Stairs-Rails: None Home Layout: One level Home Equipment: None      Prior Function Level of Independence: Independent          PT Goals (current goals can now be found in the care plan section) Acute Rehab PT Goals Patient Stated Goal: to go home today PT Goal Formulation: With patient Time For Goal Achievement: 10/03/15 Potential to Achieve Goals: Good Progress towards PT goals: Progressing toward goals    Frequency  Min 3X/week    PT Plan Current plan remains appropriate    Co-evaluation             End of Session   Activity Tolerance: Patient tolerated treatment well Patient left: in chair;with call bell/phone within reach;with chair alarm set     Time: [id number] PT Time Calculation (min) (ACUTE ONLY): 613 min  Charges:  $Gait Training: 8-22 mins  G Codes:      Claretha Cooper 09/19/2015, 12:02 PM Tresa Endo PT [phone]

## 2015-09-19 NOTE — Progress Notes (Signed)
Received patient from ICU via wheelchair being treated for COPD exacerbation. Pt continues to require oxygen via [place] 4 L. Pt awake and alert no complaints at this time except for hunger and needing a shower. Pt has orders for CPAP tonight and then reassess in am.

## 2015-09-19 NOTE — Progress Notes (Signed)
PULMONARY / CRITICAL CARE MEDICINE   Name: Ricardo Burch MRN: [id number] DOB: [birth year]    ADMISSION DATE:  09/16/2015 CONSULTATION DATE:  09/16/2015  REFERRING MD :  [facility]  CHIEF COMPLAINT:  Short of breath  SUBJECTIVE:  Feels better. Denies dyspnea, chest or abdominal pain. Has been off Bipap since sat.  VITAL SIGNS: BP 129/82 mmHg  Pulse 87  Temp(Src) 97.9 F (36.6 C) (Oral)  Resp 12  Ht 5\' 7"  (1.702 m)  Wt 253 lb 1.4 oz (114.8 kg)  BMI 39.63 kg/m2  SpO2 100%  INTAKE / OUTPUT: I/O last 3 completed shifts: In: [id number] [P.O.:1820] Out: [id number] [Urine:1275]  PHYSICAL EXAMINATION: General: Awake, No distress Neuro: Cranial nerves intact. No focal deficits.  HEENT: Moist mucus membranes. Cardiovascular: RRR, no MRG Lungs: Scattered crackles, no wheeze Abdomen: soft, non tender Musculoskeletal: 1+ edema Skin: no rashes  LABS: CMP Latest Ref Rng 09/19/2015 09/18/2015 09/17/2015  Glucose 65 - 99 mg/dL 93 105(H) 121(H)  BUN 6 - 20 mg/dL 21(H) 23(H) 23(H)  Creatinine 0.61 - 1.24 mg/dL 1.31(H) 1.11 1.23  Sodium 135 - 145 mmol/L 139 133(L) 135  Potassium 3.5 - 5.1 mmol/L 4.0 4.7 5.4(H)  Chloride 101 - 111 mmol/L 94(L) 90(L) 93(L)  CO2 22 - 32 mmol/L 41(H) 37(H) 35(H)  Calcium 8.9 - 10.3 mg/dL 8.3(L) 8.0(L) 8.1(L)  Total Protein 6.5 - 8.1 g/dL - - -  Total Bilirubin 0.3 - 1.2 mg/dL - - -  Alkaline Phos 38 - 126 U/L - - -  AST 15 - 41 U/L - - -  ALT 17 - 63 U/L - - -    CBC Latest Ref Rng 09/17/2015 09/16/2015 03/28/2014  WBC 4.0 - 10.5 K/uL 7.2 11.6(H) 11.2(H)  Hemoglobin 13.0 - 17.0 g/dL 16.7 17.0 15.9  Hematocrit 39.0 - 52.0 % 51.9 53.0(H) 49.3  Platelets 150 - 400 K/uL 238 230 246    ABG    Component Value Date/Time   PHART 7.274* 09/17/2015 0406   PCO2ART 81.2* 09/17/2015 0406   PO2ART 68.5* 09/17/2015 0406   HCO3 36.7* 09/17/2015 0406   TCO2 32.2 09/17/2015 0406   O2SAT 90.5 09/17/2015 0406    Lab Results  Component Value Date   TSH 0.668  09/16/2015   Drugs of Abuse     Component Value Date/Time   LABOPIA NONE DETECTED 09/17/2015 0238   COCAINSCRNUR POSITIVE* 09/17/2015 0238   LABBENZ NONE DETECTED 09/17/2015 0238   AMPHETMU NONE DETECTED 09/17/2015 0238   THCU NONE DETECTED 09/17/2015 0238   LABBARB NONE DETECTED 09/17/2015 0238    IMAGING US Renal Port  09/17/2015  CLINICAL DATA:  54 year old male with acute renal failure. EXAM: RENAL / URINARY TRACT ULTRASOUND COMPLETE COMPARISON:  None. FINDINGS: Right Kidney: Length: 12.3 cm. Echogenicity within normal limits. No mass or hydronephrosis visualized. Left Kidney: Length: 12.4 cm. Echogenicity within normal limits. No mass or hydronephrosis visualized. Bladder: Appears normal for degree of bladder distention. IMPRESSION: Unremarkable renal ultrasound. Electronically Signed   By: Margarette Canada M.D.   On: 09/17/2015 10:08   CULTURES: Blood 11/12 >> NGTD Respiratory viral panel 11/12 >> Pending  STUDIES:  11/13 CT head >> negative; motion artifact 11/13 Echo >>   SIGNIFICANT EVENTS: 11/12 Admit & obtunded; BiPAP, narcan  DISCUSSION: 54 yo male smoker presented with progressive dyspnea and leg edema for one week.  He was admitted with AECOPD and diastolic CHF.  Became obtunded and PCCM consulted.  UDS positive for cocaine.  ASSESSMENT / PLAN:  Acute hypoxic/hypercapnic respiratory failure in setting of AECOPD with UDS positive for cocaine. Plan: - Continue prednisone 60 mg qd. Wean by 10 mg every 3 days - Can change pulmicort, brovana to dulera. Continue spiriva - prn albuterol - bronchial hygiene - procalcitonin negative >> off antibiotics. - He will need pulmonary follow up and sleep study as an outpatient. Appointment already made  Acute encephalopathy 2nd to hypercapnia. He reportedly had improvement after narcan, but UDS negative for opiates. Plan: - monitor mental status  Tobacco abuse. Plan: - nicotine patch - smoking cessation  Acute on chronic  diastolic CHF. Plan: - continue lasix  AKI >> Stable Hyperkalemia. Plan: - f/u BMET - monitor renal fx, urine outpt  Hx of ETOH >> no evidence for withdrawal at this time. Plan: - monitor  Nutrition. Plan: - PO diet  SQ heparin for DVT prophylaxis Protonix for SUP while on high dose steroids  He can be discharged from a pulmonary perspective. Follow up arranged in the pulmonary clinic. We will sign off.  Marshell Garfinkel MD [facility] Pager [phone] If no answer or after 3pm call: [phone] 09/19/2015, 9:07 AM

## 2015-09-19 NOTE — Evaluation (Signed)
Physical Therapy Evaluation Patient Details Name: Josedejesus Asada MRN: [id number] DOB: 01-Apr-1961 Today's Date: 09/19/2015   History of Present Illness  Kenon Paik is a 54 y.o. male With h/o hypertension, COPD, chronic smoker, presents to ED 09/16/15 with worsening sob and leg edema since one week.. On arrival to ED, he was hypoxic requiring 50% venti mask, then required BiPAP. Labs revealed renal insufficiency, hyperkalemia, low sodium and elevated BNP, troponin, and AST. HIS CBC shows mild leukocytosis, and normal tsh.   Clinical Impression  Pt admitted with above diagnosis. Pt currently with functional limitations due to the deficits listed below (see PT Problem List).  Pt will benefit from skilled PT to increase their independence and safety with mobility to allow discharge to home. Sats  Upon return to room 81%on RA, dyspnea 2/4. Will need to walk again with a better saturation monitoring.     Follow Up Recommendations No PT follow up    Equipment Recommendations  None recommended by PT    Recommendations for Other Services       Precautions / Restrictions Precautions Precaution Comments: monitor sats Restrictions Weight Bearing Restrictions: No      Mobility  Bed Mobility Overal bed mobility: Independent                Transfers Overall transfer level: Independent                  Ambulation/Gait Ambulation/Gait assistance: Supervision Ambulation Distance (Feet): 280 Feet Assistive device: None Gait Pattern/deviations: WFL(Within Functional Limits)   Gait velocity interpretation: at or above normal speed for age/gender General Gait Details: Sats at rest 94% on RA prior to ambulation. difficult to  register sats during ambulation. After returned to room, changed to finger  probe, patient on RA, sats with good waveform revealed  81% with only mild dyspnea. replaced  on 2 liters and notified RN. sats increased to > 90%  Stairs            Wheelchair  Mobility    Modified Rankin (Stroke Patients Only)       Balance Overall balance assessment: Independent                                           Pertinent Vitals/Pain Pain Assessment: No/denies pain    Home Living Family/patient expects to be discharged to:: Private residence Living Arrangements: Parent;Non-relatives/Friends Available Help at Discharge: Family;Friend(s) Type of Home: House Home Access: Stairs to enter Entrance Stairs-Rails: None Entrance Stairs-Number of Steps: 3 Home Layout: One level Home Equipment: None      Prior Function Level of Independence: Independent               Hand Dominance        Extremity/Trunk Assessment   Upper Extremity Assessment: Overall WFL for tasks assessed           Lower Extremity Assessment: Overall WFL for tasks assessed         Communication   Communication: No difficulties  Cognition Arousal/Alertness: Awake/alert Behavior During Therapy: WFL for tasks assessed/performed Overall Cognitive Status: Within Functional Limits for tasks assessed                      General Comments      Exercises        Assessment/Plan    PT Assessment Patient needs continued  PT services  PT Diagnosis Difficulty walking   PT Problem List Decreased activity tolerance;Decreased mobility  PT Treatment Interventions Gait training;Functional mobility training   PT Goals (Current goals can be found in the Care Plan section) Acute Rehab PT Goals Patient Stated Goal: to go home today PT Goal Formulation: With patient Time For Goal Achievement: 10/03/15 Potential to Achieve Goals: Good    Frequency Min 3X/week   Barriers to discharge        Co-evaluation               End of Session   Activity Tolerance: Patient tolerated treatment well Patient left: in chair;with call bell/phone within reach;with chair alarm set Nurse Communication: Mobility status (low sats)          Time: [id number] PT Time Calculation (min) (ACUTE ONLY): 27 min   Charges:   PT Evaluation $Initial PT Evaluation Tier I: 1 Procedure PT Treatments $Gait Training: 8-22 mins   PT G Codes:        Claretha Cooper 09/19/2015, 11:41 AM Tresa Endo PT [phone]

## 2015-09-19 NOTE — Progress Notes (Addendum)
Pt placed on nocturnal CPAP.  Machine plugged into red outlet, humidifier filled with sterile water, and 4 Lpm of O2 bled into circuit.  Machine setting is automode with minimum of 5 cm H2O and maximum of 20 cm H2O.  Pt resting stable and comfortable.  RT will continue to monitor and assess as needed.

## 2015-09-20 DIAGNOSIS — J438 Other emphysema: Secondary | ICD-10-CM

## 2015-09-20 DIAGNOSIS — G4733 Obstructive sleep apnea (adult) (pediatric): Secondary | ICD-10-CM

## 2015-09-20 DIAGNOSIS — I5033 Acute on chronic diastolic (congestive) heart failure: Secondary | ICD-10-CM

## 2015-09-20 LAB — CBC
HCT: 53.5 % — ABNORMAL HIGH (ref 39.0–52.0)
Hemoglobin: 17 g/dL (ref 13.0–17.0)
MCH: 31.5 pg (ref 26.0–34.0)
MCHC: 31.8 g/dL (ref 30.0–36.0)
MCV: 99.3 fL (ref 78.0–100.0)
PLATELETS: 252 10*3/uL (ref 150–400)
RBC: 5.39 MIL/uL (ref 4.22–5.81)
RDW: 14.6 % (ref 11.5–15.5)
WBC: 13.1 10*3/uL — AB (ref 4.0–10.5)

## 2015-09-20 LAB — BASIC METABOLIC PANEL
Anion gap: 5 (ref 5–15)
BUN: 15 mg/dL (ref 6–20)
CALCIUM: 8.5 mg/dL — AB (ref 8.9–10.3)
CHLORIDE: 94 mmol/L — AB (ref 101–111)
CO2: 39 mmol/L — ABNORMAL HIGH (ref 22–32)
CREATININE: 0.98 mg/dL (ref 0.61–1.24)
Glucose, Bld: 75 mg/dL (ref 65–99)
Potassium: 4.3 mmol/L (ref 3.5–5.1)
SODIUM: 138 mmol/L (ref 135–145)

## 2015-09-20 MED ORDER — PREDNISONE 10 MG (21) PO TBPK: 10.0000 mg | ORAL_TABLET | Freq: Every day | ORAL | Status: DC

## 2015-09-20 MED ORDER — NICOTINE 21 MG/24HR TD PT24: 21.0000 mg | MEDICATED_PATCH | Freq: Every day | TRANSDERMAL | Status: DC

## 2015-09-20 MED ORDER — TIOTROPIUM BROMIDE MONOHYDRATE 18 MCG IN CAPS: 18.0000 ug | ORAL_CAPSULE | Freq: Every day | RESPIRATORY_TRACT | Status: DC

## 2015-09-20 MED ORDER — ASPIRIN EC 81 MG PO TBEC: 81.0000 mg | DELAYED_RELEASE_TABLET | Freq: Every day | ORAL | Status: DC

## 2015-09-20 MED ORDER — NICOTINE 14 MG/24HR TD PT24: 14.0000 mg | MEDICATED_PATCH | Freq: Every day | TRANSDERMAL | Status: DC

## 2015-09-20 MED ORDER — GUAIFENESIN ER 600 MG PO TB12
600.0000 mg | ORAL_TABLET | Freq: Two times a day (BID) | ORAL | Status: DC
  Administered 2015-09-20: 600 mg via ORAL

## 2015-09-20 MED ORDER — GUAIFENESIN ER 600 MG PO TB12: 1200.0000 mg | ORAL_TABLET | Freq: Two times a day (BID) | ORAL | Status: DC

## 2015-09-20 MED ORDER — NICOTINE 7 MG/24HR TD PT24: 7.0000 mg | MEDICATED_PATCH | Freq: Every day | TRANSDERMAL | Status: DC

## 2015-09-20 NOTE — Care Management Note (Signed)
Case Management Note  Patient Details  Name: Ricardo Burch MRN: [id number] Date of Birth: 1961/10/21  Subjective/Objective:   D/c home w/home 02 to be ordered.[employer] will deliver home 02 to rm prior d/c.Sleep study Center will contact patient w/appt.[facility] dme rep will continue to follow w/CPAP process on an otpt basis.                 Action/Plan:d/c home.   Expected Discharge Date:                  Expected Discharge Plan:  Home/Self Care  In-House Referral:  NA  Discharge planning Services  CM Consult  Post Acute Care Choice:  NA Choice offered to:     DME Arranged:  [facility] DME Agency:  [place]:    [place] Agency:     Status of Service:  Completed, signed off  Medicare Important Message Given:    Date Medicare IM Given:    Medicare IM give by:    Date Additional Medicare IM Given:    Additional Medicare Important Message give by:     If discussed at [place] of Stay Meetings, dates discussed:    Additional Comments:  Dessa Phi, RN 09/20/2015, 1:57 PM

## 2015-09-20 NOTE — Care Management Note (Signed)
Case Management Note  Patient Details  Name: Aarin Langfitt MRN: [id number] Date of Birth: 1960-12-26  Subjective/Objective: Noted 02 sats qualify for home 02.TC [facility] dme rep Lecretia following for home 02 orders. Faxed otpt sleep study to sleep study center w/confirmation [id number] [phone]-Kiya-she will call me back w/appt date likely in January. [facility] dme rep will be able to work on CPAP process, but it is likely not to be delivered to hospital @ d/c if appt is not set up prior, & insurance approval.                  Action/Plan:d/c plan home w/home 02.   Expected Discharge Date:                  Expected Discharge Plan:  Home/Self Care  In-House Referral:  NA  Discharge planning Services  CM Consult  Post Acute Care Choice:  NA Choice offered to:     DME Arranged:    DME Agency:     HH Arranged:    HH Agency:     Status of Service:  In process, will continue to follow  Medicare Important Message Given:    Date Medicare IM Given:    Medicare IM give by:    Date Additional Medicare IM Given:    Additional Medicare Important Message give by:     If discussed at [place] of Stay Meetings, dates discussed:    Additional Comments:  Dessa Phi, RN 09/20/2015, 1:23 PM

## 2015-09-20 NOTE — Progress Notes (Signed)
Pt continued to be noncompliant with CPAP through out the night, 02 stats 70's to low 90's. Found many times with his mask off on the top of his head. Placed on 6L [place], will continue to monitor and reassess. Jeanie Sewer, RN 5:26 AM 09/20/2015

## 2015-09-20 NOTE — Discharge Summary (Signed)
Discharge Summary  Ricardo Burch [id number] DOB: [birth year]  PCP: [name], MD  Admit date: 09/16/2015 Discharge date: 09/20/2015  Time spent: >13mins  Recommendations for Outpatient Follow-up:  1. F/u with PMD within a week for hospital discharge follow up, pmd to follow up on final sleep study result. Repeat bmp at follow up to monitor renal function 2. F/u with pulmonology Dr. Michael Litter on 12/9  3. Patient is discharged on home oxygen  Discharge Diagnoses:  Active Hospital Problems   Diagnosis Date Noted  . ARF (acute renal failure) ([place])   . Acute respiratory failure ([place]) 09/16/2015  . COPD (chronic obstructive pulmonary disease) ([place]) 08/23/2014    Resolved Hospital Problems   Diagnosis Date Noted Date Resolved  No resolved problems to display.    Discharge Condition: stable  Diet recommendation: heart healthy  Filed Weights   09/18/15 0442 09/19/15 0621 09/20/15 0516  Weight: 256 lb 2.8 oz (116.2 kg) 253 lb 1.4 oz (114.8 kg) 253 lb 15.5 oz (115.2 kg)    History of present illness:  Ricardo Burch is a 54 y.o. male With h/o hypertension, COPD, chronic smoker, presents to ED with worsening sob and leg edema since one week. He reports going to his PCP and he was given prescription for lasix and prednisone taper, he reports his symptoms didn't get and came to ED. He continues to smoke and take alcohol. He reports having orthopnea sometimes.  On arrival to ED, he was hypoxic requiring 50% venti mask and we have transitioned him to [place] oxygen. His d dimer is negativ e. Labs revealed renal insufficiency, hyperkalemia, low sodium and elevated BNP, troponin, and AST. HIS CBC shows mild leukocytosis, and normal tsh.  He was referred to medical service for admission  Hospital Course:  Active Problems:   COPD (chronic obstructive pulmonary disease) (HCC)   Acute respiratory failure (HCC)   ARF (acute renal failure) (HCC)   ACUTE respiratory failure from hypoxia and  hypercarbia from copd exacerbation and Mild CHF exacerbation and OSA: Admitted to step down and required BIPAP the night on 11/12 for CO2 narcosis and acute encephalopathy.  He was on BIPAP till 7 45 am and has been on [place] oxygen 2 lit without any issues.  PCCm was consulted and bronchodilators changed. Solumedrol changed to prednisone to be tapered over the next 2 weeks.  Resume nicotine patch.  Echocardiogram  showed moderate pulmonary hypertension.  Overnight patient desaturated on RA during sleep and he required 35% of oxygen, probably from OSA.  CPAP ordered, outpatient split study ordered. Patient insist on being discharged home, He is discharged on home oxygen, due to desats at rest. He is to follow up with pulmonology  2. Hyperkalemia: k 5.5 on admission, kayexalate given, and repeat K is normal.   3. Mild acute on chronic diastolic CHF: Received iv and po lasix in the hospital, bilateral lower extremity edema improved.  Echo showed moderate pulmonary hypertension., elevated troponins from CHF and possibly cocaine use.  Discharged with lasix 20mg  po qd  4. ARF: Cr 1.53 on admission,  ua no infection, US renal negative.  Urine sodium is 30 and creatinine is 65.  Monitor renal parameters.  Cr 0.98 normalized at discharge  5 Drug abuse: UDS positive for cocaine.     Code Status: full code.  Family Communication: patient Disposition Plan: home with home oxygen on 11/16    Consultants:  PCCM.  Procedures:  none  Antibiotics:  None.  Discharge Exam: BP 128/79 mmHg  Pulse 93  Temp(Src) 99 F (37.2 C) (Oral)  Resp 20  Ht 5\' 7"  (1.702 m)  Wt 253 lb 15.5 oz (115.2 kg)  BMI 39.77 kg/m2  SpO2 94%   General: Alert conversing currently 2 lit of [place] oxygen.   Cardiovascular: s1s2  Respiratory: no wheezing heard, fair air entry,   Abdomen: soft obese, non tender non distended   Musculoskeletal: subsided bilateral pedal edema, no cyanosis or  clubbing   Discharge Instructions You were cared for by a hospitalist during your hospital stay. If you have any questions about your discharge medications or the care you received while you were in the hospital after you are discharged, you can call the unit and asked to speak with the hospitalist on call if the hospitalist that took care of you is not available. Once you are discharged, your primary care physician will handle any further medical issues. Please note that NO REFILLS for any discharge medications will be authorized once you are discharged, as it is imperative that you return to your primary care physician (or establish a relationship with a primary care physician if you do not have one) for your aftercare needs so that they can reassess your need for medications and monitor your lab values.      Discharge Instructions    Diet - low sodium heart healthy    Complete by:  As directed      For home use only DME oxygen    Complete by:  As directed   Mode or (Route):  Nasal cannula  Liters per Minute:  2  Frequency:  Continuous (stationary and portable oxygen unit needed)  Oxygen delivery system:  Gas     Increase activity slowly    Complete by:  As directed             Medication List    STOP taking these medications        doxycycline 100 MG tablet  Commonly known as:  VIBRA-TABS     meloxicam 15 MG tablet  Commonly known as:  MOBIC     predniSONE 50 MG tablet  Commonly known as:  DELTASONE  Replaced by:  predniSONE 10 MG (21) Tbpk tablet     triamcinolone cream 0.1 %  Commonly known as:  KENALOG     varenicline 1 MG tablet  Commonly known as:  CHANTIX CONTINUING MONTH PAK      TAKE these medications        aspirin EC 81 MG tablet  Take 1 tablet (81 mg total) by mouth daily.     furosemide 20 MG tablet  Commonly known as:  LASIX  Take 1 tablet (20 mg total) by mouth daily.     guaiFENesin 600 MG 12 hr tablet  Commonly known as:  MUCINEX  Take 2  tablets (1,200 mg total) by mouth 2 (two) times daily.     nicotine 21 mg/24hr patch  Commonly known as:  NICODERM CQ - dosed in mg/24 hours  Place 1 patch (21 mg total) onto the skin daily.     nicotine 14 mg/24hr patch  Commonly known as:  [employer] NICOTINE TRANSDERMAL SYS  Place 1 patch (14 mg total) onto the skin daily.  Start taking on:  11/01/2015     nicotine 7 mg/24hr patch  Commonly known as:  [employer] NICOTINE  Place 1 patch (7 mg total) onto the skin daily.  Start taking on:  11/15/2015     predniSONE 10 MG (21)  Tbpk tablet  Commonly known as:  STERAPRED UNI-PAK 21 TAB  Take 1 tablet (10 mg total) by mouth daily. Label  & dispense according to the schedule below. 5Pills PO for day one, 4 Pills PO for day 2, 3 Pills PO for day 3, 2 Pills PO for day 4, one pill for day 5, then stop.     SYMBICORT 80-4.5 MCG/ACT inhaler  Generic drug:  budesonide-formoterol  INHALE 2 PUFFS INTO THE LUNGS 2 TIMES DAILY     tiotropium 18 MCG inhalation capsule  Commonly known as:  SPIRIVA  Place 1 capsule (18 mcg total) into inhaler and inhale daily.     VENTOLIN HFA 108 (90 BASE) MCG/ACT inhaler  Generic drug:  albuterol  INHALE 2 PUFFS INTO THE LUNGS EVERY 6 HOURS AS NEEDED FOR WHEEZING OR SHORTNESS OF BREATH       No Known Allergies Follow-up Information    Follow up with Marshell Garfinkel, MD On 10/13/2015.   Specialty:  Pulmonary Disease   Why:  [facility] - appointment at 2:30 PM    Contact information:   [address] [place] [address] [phone]       Follow up with [name], MD In 1 week.   Specialty:  Family Medicine   Why:  hospital discharge follow up   Contact information:   [place] [address] [phone]       Follow up with [facility].   Why:  home [address]   Contact information:   [address] [place] [address] [phone]        The results of significant diagnostics from this  hospitalization (including imaging, microbiology, ancillary and laboratory) are listed below for reference.    Significant Diagnostic Studies: Dg Chest 2 View  09/16/2015  CLINICAL DATA:  Pt states SOB x 3-4 days. Pt states he "could hardly walk anymore". Hx COPD EXAM: CHEST  2 VIEW COMPARISON:  08/24/2015 FINDINGS: Lungs are hyperinflated. Heart is enlarged and stable in configuration. There are no focal consolidations or pleural effusions. Old rib fractures are noted. There are degenerative changes in the mid thoracic spine. IMPRESSION: 1. Hyperinflation. 2. Stable cardiomegaly. Electronically Signed   By: Nolon Nations M.D.   On: 09/16/2015 11:55   Dg Chest 2 View  08/24/2015  CLINICAL DATA:  Upper respiratory infection for 1 week, smoking history EXAM: CHEST  2 VIEW COMPARISON:  Chest x-ray of 03/27/2014 and CT chest of 10/09/2013 FINDINGS: The lungs are clear but somewhat hyperaerated. Cardiomegaly is stable. The hila are slightly prominent but stable most consistent with a degree of pulmonary arterial hypertension when compared to the prior CT of the chest. Old left rib fractures are noted. IMPRESSION: Stable cardiomegaly.  No active lung disease.  Hyper aeration. Electronically Signed   By: Ivar Drape M.D.   On: 08/24/2015 15:00   Ct Head Wo Contrast  09/17/2015  CLINICAL DATA:  Altered mental status EXAM: CT HEAD WITHOUT CONTRAST TECHNIQUE: Contiguous axial images were obtained from the base of the skull through the vertex without intravenous contrast. COMPARISON:  None. FINDINGS: Multiple scans were acquired due to repeated patient motion. Skull and Sinuses:Negative for fracture or destructive process. The mastoids, middle ears, and imaged paranasal sinuses are clear. Orbits: Symmetric proptosis without extraocular muscle enlargement or mass. Brain: No evidence of acute infarction, hemorrhage, hydrocephalus, or mass lesion/mass effect. IMPRESSION: Negative motion degraded head CT.  Electronically Signed  By: Monte Fantasia M.D.   On: 09/17/2015 02:54   US Renal Port  09/17/2015  CLINICAL DATA:  54 year old male with acute renal failure. EXAM: RENAL / URINARY TRACT ULTRASOUND COMPLETE COMPARISON:  None. FINDINGS: Right Kidney: Length: 12.3 cm. Echogenicity within normal limits. No mass or hydronephrosis visualized. Left Kidney: Length: 12.4 cm. Echogenicity within normal limits. No mass or hydronephrosis visualized. Bladder: Appears normal for degree of bladder distention. IMPRESSION: Unremarkable renal ultrasound. Electronically Signed   By: Margarette Canada M.D.   On: 09/17/2015 10:08    Microbiology: Recent Results (from the past 240 hour(s))  MRSA PCR Screening     Status: None   Collection Time: 09/16/15  2:50 PM  Result Value Ref Range Status   MRSA by PCR NEGATIVE NEGATIVE Final    Comment:        The GeneXpert MRSA Assay (FDA approved for NASAL specimens only), is one component of a comprehensive MRSA colonization surveillance program. It is not intended to diagnose MRSA infection nor to guide or monitor treatment for MRSA infections.   Culture, blood (routine x 2)     Status: None (Preliminary result)   Collection Time: 09/16/15 11:00 PM  Result Value Ref Range Status   Specimen Description BLOOD LEFT ARM  Final   Special Requests BOTTLES DRAWN AEROBIC ONLY [place]  Final   Culture   Final    NO GROWTH 3 DAYS Performed at [facility]    Report Status PENDING  Incomplete  Culture, blood (routine x 2)     Status: None (Preliminary result)   Collection Time: 09/16/15 11:05 PM  Result Value Ref Range Status   Specimen Description BLOOD LEFT HAND  Final   Special Requests IN PEDIATRIC BOTTLE 4CC  Final   Culture   Final    NO GROWTH 3 DAYS Performed at [facility]    Report Status PENDING  Incomplete  Respiratory virus panel     Status: None   Collection Time: 09/17/15  8:00 AM  Result Value Ref Range Status   Source - RVPAN  NASOPHARYNGEAL SWAB  Corrected   Respiratory Syncytial Virus A Negative Negative Final   Respiratory Syncytial Virus B Negative Negative Final   Influenza A Negative Negative Final   Influenza B Negative Negative Final   Parainfluenza 1 Negative Negative Final   Parainfluenza 2 Negative Negative Final   Parainfluenza 3 Negative Negative Final   Metapneumovirus Negative Negative Final   Rhinovirus Negative Negative Final   Adenovirus Negative Negative Final    Comment: (NOTE) Performed At: [facility] [address] [place], [place] [id number] Lindon Romp MD [id number]      Labs: Basic Metabolic Panel:  Recent Labs Lab 09/16/15 2300 09/17/15 0320 09/18/15 0345 09/19/15 0407 09/20/15 0510  NA 135 135 133* 139 138  K 5.5* 5.4* 4.7 4.0 4.3  CL 89* 93* 90* 94* 94*  CO2 37* 35* 37* 41* 39*  GLUCOSE 126* 121* 105* 93 75  BUN 25* 23* 23* 21* 15  CREATININE 1.47* 1.23 1.11 1.31* 0.98  CALCIUM 8.7* 8.1* 8.0* 8.3* 8.5*  MG 2.3 2.2  --   --   --   PHOS 7.1* 6.0*  --   --   --    Liver Function Tests:  Recent Labs Lab 09/16/15 1140 09/16/15 2300 09/17/15 0320  AST 46*  --   --   ALT 34  --   --   ALKPHOS 91  --   --  BILITOT 2.2*  --   --   PROT 6.9  --   --   ALBUMIN 3.6 3.7 3.3*   No results for input(s): LIPASE, AMYLASE in the last 168 hours. No results for input(s): AMMONIA in the last 168 hours. CBC:  Recent Labs Lab 09/16/15 1140 09/17/15 0320 09/20/15 0510  WBC 11.6* 7.2 13.1*  NEUTROABS 7.4 6.3  --   HGB 17.0 16.7 17.0  HCT 53.0* 51.9 53.5*  MCV 97.2 98.3 99.3  PLT 230 238 252   Cardiac Enzymes:  Recent Labs Lab 09/16/15 1140 09/16/15 1600 09/16/15 2130 09/17/15 0932  TROPONINI 0.51* 0.48* 0.46* 0.22*   BNP: BNP (last 3 results)  Recent Labs  09/16/15 1140 09/18/15 0500  BNP 1091.5* 359.1*    ProBNP (last 3 results) No results for input(s): PROBNP in the last 8760 hours.  CBG:  Recent Labs Lab 09/16/15 2126    GLUCAP 140*       Signed:  Vardaan Depascale MD, PhD  [facility] 09/20/2015, 7:03 PM

## 2015-09-22 LAB — CULTURE, BLOOD (ROUTINE X 2)
Culture: NO GROWTH
Culture: NO GROWTH

## 2015-10-13 ENCOUNTER — Encounter: Payer: Self-pay | Admitting: Pulmonary Disease

## 2015-10-13 ENCOUNTER — Ambulatory Visit (INDEPENDENT_AMBULATORY_CARE_PROVIDER_SITE_OTHER): Payer: 59 | Admitting: Pulmonary Disease

## 2015-10-13 VITALS — BP 144/90 | HR 104 | Temp 98.6°F | Ht 67.0 in | Wt 244.4 lb

## 2015-10-13 DIAGNOSIS — J441 Chronic obstructive pulmonary disease with (acute) exacerbation: Secondary | ICD-10-CM | POA: Diagnosis not present

## 2015-10-13 NOTE — Progress Notes (Signed)
Subjective:    Patient ID: Ricardo Burch, male    DOB: 12-14-1960, 54 y.o.   MRN: [id number]  HPI Hospital follow-up for COPD  Ricardo Burch is a 54 year old active smoker, COPD. He was admitted last month to [facility] for dyspnea, lower extremity edema. He was admitted to stepdown and required BiPAP for CO2 retention and altered mental status.This treated with antibiotics, nebulizers, steroids. Get an echocardiogram that showed moderate pulmonary hypertension. He was eventually transitioned to the floor.It was noted that he had desats at night just thought to be due to OSA and was given CPAP. A split-night study was arranged as an outpatient and he was discharged on supplemental oxygen.  Since discharge he continues to do well. He denies any cough, sputum production, wheezing. He is using the supplemental oxygen at 2 L/m and scheduled for a sleep test on the 12th of this month. He still continues to smoke but is trying to cut down using nicotine patches. He is now down to about half a pack a day.  DATA: CXR  (09/15/15) 1. Hyperinflation. 2. Stable cardiomegaly.  Echo (09/18/15) The right ventricular systolic pressure was increased consistent with moderate pulmonary hypertension.moderate concentric LVH. LVEF 60-65 percent. Grade 2 diastolic dysfunction. RV cavity size severe grade dilated. RV systolic function moderately to severely reduced. PA pressure 56   Past Medical History  Diagnosis Date  . Eczema     since childhood   . COPD (chronic obstructive pulmonary disease) ([place]) Dx 2015  . CHF (congestive heart failure) ([place])     Current outpatient prescriptions:  .  aspirin EC 81 MG tablet, Take 1 tablet (81 mg total) by mouth daily., Disp: 30 tablet, Rfl: 0 .  furosemide (LASIX) 20 MG tablet, Take 1 tablet (20 mg total) by mouth daily., Disp: 30 tablet, Rfl: 3 .  guaiFENesin (MUCINEX) 600 MG 12 hr tablet, Take 2 tablets (1,200 mg total) by mouth 2 (two) times daily., Disp:  60 tablet, Rfl: 0 .  SYMBICORT 80-4.5 MCG/ACT inhaler, INHALE 2 PUFFS INTO THE LUNGS 2 TIMES DAILY, Disp: 1 Inhaler, Rfl: 3 .  VENTOLIN HFA 108 (90 BASE) MCG/ACT inhaler, INHALE 2 PUFFS INTO THE LUNGS EVERY 6 HOURS AS NEEDED FOR WHEEZING OR SHORTNESS OF BREATH, Disp: 54 each, Rfl: 3 .  [START ON 11/01/2015] nicotine ([employer]S NICOTINE TRANSDERMAL SYS) 14 mg/24hr patch, Place 1 patch (14 mg total) onto the skin daily. (Patient not taking: Reported on 10/13/2015), Disp: 14 patch, Rfl: 0 .  [START ON 11/15/2015] nicotine ([employer] NICOTINE) 7 mg/24hr patch, Place 1 patch (7 mg total) onto the skin daily. (Patient not taking: Reported on 10/13/2015), Disp: 14 patch, Rfl: 0 .  nicotine (NICODERM CQ - DOSED IN MG/24 HOURS) 21 mg/24hr patch, Place 1 patch (21 mg total) onto the skin daily. (Patient not taking: Reported on 10/13/2015), Disp: 42 patch, Rfl: 0 .  tiotropium (SPIRIVA) 18 MCG inhalation capsule, Place 1 capsule (18 mcg total) into inhaler and inhale daily. (Patient not taking: Reported on 10/13/2015), Disp: 30 capsule, Rfl: 12  Review of Systems Dyspnea on exertion, cough. No sputum production, fevers, chills, hemoptysis. No chest pain, palpitations. No nausea, vomiting, diarrhea, constipation.All other review re negative.    Objective:   Physical Exam  Blood pressure 144/90, pulse 104, temperature 98.6 F (37 C), temperature source Oral, height 5\' 7"  (1.702 m), weight 244 lb 6.4 oz (110.859 kg), SpO2 91 %.  Gen: No apparent distress Neuro: No gross focal deficits. Neck: No JVD,  lymphadenopathy, thyromegaly. RS: Clear, no wheeze, crackles CVS: S1-S2 heard, no murmurs rubs gallops. Abdomen: Soft, positive bowel sounds. Extremities: No edema.     Assessment & Plan:  #1 COPD Ricardo Burch likely has COPD based on his presentation record. He is maintained on Spiriva and Symbicort and is doing well on this. He continues to be on supplemental oxygen. He will continue his inhalers as prescribed and we will  reassess his O2 requirements at rest on on on an ambulation at the time of the next visit.  #2 Suspected OSA OSA suspected based on his body habitus and desats at night during admission. His sleep study is scheduled for 12/12.   #3 Tobacco use History continues to smoke in spite of nicotine patch use. He is trying to cut down and is own to half pack a day. I have encouraged him to quit completely.Total counseling time spent on this 5 minutes.  #4 Pulmonary hypertension I suspect this is secondary to COPD, untreated OSA. We will treat him for these conditions and reevaluate with repeat echo.  Plan: - Continue Symbicort and Spiriva. - Pulmonary function tests - Split-night sleep study already scheduled. - Smoking cessation.  Marshell Garfinkel MD [facility] Pager [phone] If no answer or after 3pm call: [phone] 10/13/2015, 3:20 PM

## 2015-10-13 NOTE — Patient Instructions (Signed)
We will schedule you for lung function tests. Continue using the Symbicort, Spiriva and supplemental oxygen You are scheduled for sleep study on the 12/12 Encouraged to quit smoking.  Follow-up in 3 months.

## 2015-10-16 ENCOUNTER — Ambulatory Visit (HOSPITAL_BASED_OUTPATIENT_CLINIC_OR_DEPARTMENT_OTHER): Payer: 59 | Attending: Internal Medicine | Admitting: Radiology

## 2015-10-16 VITALS — Ht 67.0 in | Wt 244.0 lb

## 2015-10-16 DIAGNOSIS — G4719 Other hypersomnia: Secondary | ICD-10-CM | POA: Insufficient documentation

## 2015-10-16 DIAGNOSIS — G4733 Obstructive sleep apnea (adult) (pediatric): Secondary | ICD-10-CM | POA: Insufficient documentation

## 2015-10-16 DIAGNOSIS — R0683 Snoring: Secondary | ICD-10-CM

## 2015-10-16 DIAGNOSIS — E669 Obesity, unspecified: Secondary | ICD-10-CM | POA: Diagnosis not present

## 2015-10-16 DIAGNOSIS — G4736 Sleep related hypoventilation in conditions classified elsewhere: Secondary | ICD-10-CM | POA: Insufficient documentation

## 2015-10-16 DIAGNOSIS — I509 Heart failure, unspecified: Secondary | ICD-10-CM | POA: Diagnosis not present

## 2015-10-16 DIAGNOSIS — Z6838 Body mass index (BMI) 38.0-38.9, adult: Secondary | ICD-10-CM | POA: Insufficient documentation

## 2015-10-16 DIAGNOSIS — J449 Chronic obstructive pulmonary disease, unspecified: Secondary | ICD-10-CM | POA: Insufficient documentation

## 2015-10-22 DIAGNOSIS — R0683 Snoring: Secondary | ICD-10-CM | POA: Diagnosis not present

## 2015-10-22 NOTE — Progress Notes (Signed)
Patient Name: Ricardo Burch, Ricardo Burch Date: 10/16/2015 Gender: Male D.O.B: [birth year] Age (years): 54 Referring Provider: Hosie Poisson Height (inches): 50 Interpreting Physician: Baird Lyons MD, ABSM Weight (lbs): 244 RPSGT: Carolin Coy BMI: 38 MRN: [id number] Neck Size: 17.00 CLINICAL INFORMATION Sleep Study Type: Split Night CPAP Indication for sleep study: Congestive Heart Failure, COPD, Excessive Daytime Sleepiness, Obesity, Snoring Epworth Sleepiness Score: 4 SLEEP STUDY TECHNIQUE As per the AASM Manual for the Scoring of Sleep and Associated Events v2.3 (April 2016) with a hypopnea requiring 4% desaturations. The channels recorded and monitored were frontal, central and occipital EEG, electrooculogram (EOG), submentalis EMG (chin), nasal and oral airflow, thoracic and abdominal wall motion, anterior tibialis EMG, snore microphone, electrocardiogram, and pulse oximetry. Continuous positive airway pressure (CPAP) was initiated when the patient met split night criteria and was titrated according to treat sleep-disordered breathing. MEDICATIONS Medications taken by the patient : charted for review Medications administered by patient during sleep study : No sleep medicine administered.  RESPIRATORY PARAMETERS Diagnostic Total AHI (/hr): 151.1 RDI (/hr): 151.6 OA Index (/hr): 10.1 CA Index (/hr): 0.0 REM AHI (/hr): 101.5 NREM AHI (/hr): 153.7 Supine AHI (/hr): 151.1 Non-supine AHI (/hr): N/A Min O2 Sat (%): 68.00 Mean O2 (%): 84.46 Time below 88% (min): 106.9   Titration Optimal Pressure (cm): 9* AHI at Optimal Pressure (/hr): 101.8* Min O2 at Optimal Pressure (%): 71.0 Supine % at Optimal (%): 100 Sleep % at Optimal (%): 98    SLEEP ARCHITECTURE The recording time for the entire night was 392.6 minutes. During a baseline period of 212.7 minutes, the patient slept for 131.0 minutes in REM and nonREM, yielding a sleep efficiency of 61.6%. Sleep onset after lights out was 37.5  minutes with a REM latency of 119.0 minutes. The patient spent 34.73% of the night in stage N1 sleep, 60.31% in stage N2 sleep, 0.00% in stage N3 and 4.96% in REM. During the titration period of 178.5 minutes, the patient slept for 51.5 minutes in REM and nonREM, yielding a sleep efficiency of 28.9%. Sleep onset after CPAP initiation was 2.3 minutes with a REM latency of 134.0 minutes. The patient spent 48.54% of the night in stage N1 sleep, 20.39% in stage N2 sleep, 0.00% in stage N3 and 31.07% in REM.  CARDIAC DATA The 2 lead EKG demonstrated sinus rhythm. The mean heart rate was 87.45 beats per minute. Other EKG findings include: None.  LEG MOVEMENT DATA The total Periodic Limb Movements of Sleep (PLMS) were 0. The PLMS index was 0.00 .  IMPRESSIONS - Severe obstructive sleep apnea occurred during the diagnostic portion of the study (AHI = 151.1/hour). An optimal PAP pressure could not be selected due to inadequate sleep time with CPAP. Patient may need sleep aid during initial CPAP trial. May need BILevel and supplemental oxygen. - No significant central sleep apnea occurred during the diagnostic portion of the study (CAI = 0.0/hour). - Severe oxygen desaturation was noted during the diagnostic portion of the study (Min O2 = 68.00%, Mean sat 84%). - The patient snored with Moderate snoring volume during the diagnostic portion of the study. - No cardiac abnormalities were noted during this study. - Clinically significant periodic limb movements did not occur during sleep.  DIAGNOSIS - Obstructive Sleep Apnea (327.23 [G47.33 ICD-10]) - Nocturnal Hypoxemia (327.26 [G47.36 ICD-10])  RECOMMENDATIONS - Trial home autotitration with a Medium size Fisher&Paykel Full Face Mask Simplus mask and heated humidification. - Avoid alcohol, sedatives and other CNS depressants that may worsen sleep  apnea and disrupt normal sleep architecture. - Sleep hygiene should be reviewed to assess factors that may  improve sleep quality. - Weight management and regular exercise should be initiated or continued.  Deneise Lever Diplomate, American Board of Sleep Medicine  ELECTRONICALLY SIGNED ON:  10/22/2015, 9:32 AM [place] PH: [phone]   FX: [phone] [place]

## 2015-11-14 MED FILL — FUROSEMIDE 20 MG TABLET: 20 | 30 days supply | Qty: 30 | Fill #3

## 2015-11-16 MED FILL — SPIRIVA 18 MCG CP-HANDIHALE: 18 | 30 days supply | Qty: 30 | Fill #0

## 2015-11-24 ENCOUNTER — Telehealth: Payer: Self-pay | Admitting: Pulmonary Disease

## 2015-11-24 DIAGNOSIS — G4733 Obstructive sleep apnea (adult) (pediatric): Secondary | ICD-10-CM

## 2015-11-24 NOTE — Telephone Encounter (Signed)
Spoke with pt. States that he received something in mail, which he thought were his results of his sleep study. Advised him that we typically call with results and not send a letter. He took this letter and went to [facility] to try to get a CPAP. They will need an order before they can supply him with a CPAP.  Dr. Vaughan Browner - please advise on sleep study results and if the pt needs a CPAP.

## 2015-11-24 NOTE — Telephone Encounter (Signed)
Please let him know that the sleep study shows severe sleep apnea. We will order the CPAP. He will need autoset CPAP 5-20 with mask fit and a overnight oximetry on CPAP. Please get a download on the machine

## 2015-11-24 NOTE — Telephone Encounter (Signed)
Pt is aware of results. Order has been placed. Nothing further was needed.

## 2015-12-04 ENCOUNTER — Telehealth: Payer: Self-pay | Admitting: Pharmacist

## 2015-12-04 NOTE — Telephone Encounter (Signed)
Called patient to see if he had gotten the flu shot this season or if he was interested in getting it. Patient was not home so left a HIPAA compliant voicemail with a family member.

## 2015-12-05 ENCOUNTER — Telehealth: Payer: Self-pay | Admitting: Family Medicine

## 2015-12-05 NOTE — Telephone Encounter (Signed)
Pt called returning nurse's phone call regarding flu shot, patient states he will call us back is not interested at this time

## 2015-12-05 NOTE — Telephone Encounter (Signed)
Will document that patient has declined the flu shot.

## 2015-12-12 MED FILL — PROAIR HFA 90 MCG INHALER: 108 (90 BAS | 30 days supply | Qty: 9 | Fill #0

## 2015-12-12 MED FILL — FUROSEMIDE 20 MG TABLET: 20 | 30 days supply | Qty: 30 | Fill #0

## 2015-12-12 MED FILL — SPIRIVA 18 MCG CP-HANDIHALE: 18 | 30 days supply | Qty: 30 | Fill #1

## 2015-12-14 ENCOUNTER — Telehealth: Payer: Self-pay | Admitting: Pulmonary Disease

## 2015-12-14 NOTE — Telephone Encounter (Signed)
Called spoke with pt. He aware 1 sample of symbicort will placed for pick up. No samples of rescue inhaler. Nothing further needed

## 2016-01-16 ENCOUNTER — Encounter: Payer: Self-pay | Admitting: Pulmonary Disease

## 2016-01-16 ENCOUNTER — Ambulatory Visit (INDEPENDENT_AMBULATORY_CARE_PROVIDER_SITE_OTHER): Payer: Medicaid Other | Admitting: Pulmonary Disease

## 2016-01-16 VITALS — BP 144/86 | HR 89 | Ht 70.0 in | Wt 250.0 lb

## 2016-01-16 DIAGNOSIS — J441 Chronic obstructive pulmonary disease with (acute) exacerbation: Secondary | ICD-10-CM

## 2016-01-16 DIAGNOSIS — G4733 Obstructive sleep apnea (adult) (pediatric): Secondary | ICD-10-CM

## 2016-01-16 LAB — PULMONARY FUNCTION TEST
DL/VA % pred: 83 %
DL/VA: 3.87 ml/min/mmHg/L
DLCO UNC: 18.27 ml/min/mmHg
DLCO cor % pred: 55 %
DLCO cor: 18.07 ml/min/mmHg
DLCO unc % pred: 56 %
FEF 25-75 Post: 1.16 L/sec
FEF 25-75 Pre: 0.77 L/sec
FEF2575-%Change-Post: 51 %
FEF2575-%Pred-Post: 35 %
FEF2575-%Pred-Pre: 23 %
FEV1-%CHANGE-POST: 10 %
FEV1-%PRED-POST: 49 %
FEV1-%PRED-PRE: 45 %
FEV1-POST: 1.65 L
FEV1-PRE: 1.49 L
FEV1FVC-%Change-Post: -1 %
FEV1FVC-%Pred-Pre: 76 %
FEV6-%Change-Post: 11 %
FEV6-%PRED-POST: 66 %
FEV6-%Pred-Pre: 59 %
FEV6-POST: 2.69 L
FEV6-PRE: 2.41 L
FEV6FVC-%CHANGE-POST: 0 %
FEV6FVC-%PRED-POST: 102 %
FEV6FVC-%PRED-PRE: 102 %
FVC-%CHANGE-POST: 12 %
FVC-%Pred-Post: 65 %
FVC-%Pred-Pre: 58 %
FVC-Post: 2.72 L
FVC-Pre: 2.43 L
POST FEV6/FVC RATIO: 99 %
PRE FEV6/FVC RATIO: 99 %
Post FEV1/FVC ratio: 61 %
Pre FEV1/FVC ratio: 61 %
RV % PRED: 142 %
RV: 2.96 L
TLC % PRED: 81 %
TLC: 5.67 L

## 2016-01-16 MED ORDER — VARENICLINE TARTRATE 0.5 MG X 11 & 1 MG X 42 PO MISC
ORAL | Status: DC
Start: 2016-01-16 — End: 2016-06-29

## 2016-01-16 MED ORDER — VARENICLINE TARTRATE 1 MG PO TABS: 1.0000 mg | ORAL_TABLET | Freq: Two times a day (BID) | ORAL | Status: DC

## 2016-01-16 NOTE — Addendum Note (Signed)
Addended by: Parke Poisson E on: 01/16/2016 04:30 PM   Modules accepted: Orders

## 2016-01-16 NOTE — Patient Instructions (Addendum)
Continue using the CPAP and symbicort, Spiriva. Please continue using the supplemental oxygen especially on aambulation. We will recheck her O2 levels at next visit.  I'll start you on Chantix for smoking cessation. Please select a quit date and start the Chantix as instructed 1 week before this quit date  Return to clinic in 3 months.

## 2016-01-16 NOTE — Progress Notes (Signed)
PFT done today. 

## 2016-01-16 NOTE — Progress Notes (Addendum)
Subjective:    Patient ID: Ricardo Burch, male    DOB: 07-29-1961, 55 y.o.   MRN: [id number]  PROBLEM LIST: Severe COPD Severe OSA on autoset Active smoker  HPI Ricardo Burch is a 55 year old active smoker, COPD. He was admitted on 11/16 to [facility] for dyspnea, lower extremity edema. He was admitted to stepdown and required BiPAP for CO2 retention and altered mental status.This treated with antibiotics, nebulizers, steroids. Get an echocardiogram that showed moderate pulmonary hypertension.   Since discharge he continues to do well. He denies any cough, sputum production, wheezing. He is using the supplemental oxygen at 2 L/m which he uses intermittently. He does sleep study done which showed severe OSA and was started on an AutoSet CPAP. He is tolerating it well and feels this made a great difference in his sleep quality.   DATA: CXR  (09/15/15) 1. Hyperinflation. 2. Stable cardiomegaly.  Echo (09/18/15) The right ventricular systolic pressure was increased consistent with moderate pulmonary hypertension.moderate concentric LVH. LVEF 60-65 percent. Grade 2 diastolic dysfunction. RV cavity size severe grade dilated. RV systolic function moderately to severely reduced. PA pressure 56  Sleep study 10/22/15 Severe OSA, AHI 151. Started on an AutoSet  CPAP compliance report 1/17 to 3/17 Good compliance with 92% usage greater than 4 hours. Pressure 8.7 AHI 9  PFTs 01/16/16 FVC 2.43 [58%] FEV1 1.49 [45%) F/F 61 TLC 81% DLCO 56% Severe obstruction, moderate reduction in diffusion capacity.  Social History: He still continues to smoke but is trying to cut down using nicotine patches. He is now down to about half a pack a day.  Family History: Father-cerebral aneurysm.  Past Medical History  Diagnosis Date  . Eczema     since childhood   . COPD (chronic obstructive pulmonary disease) ([place]) Dx 2015  . CHF (congestive heart failure) ([place])     Current outpatient  prescriptions:  .  aspirin EC 81 MG tablet, Take 1 tablet (81 mg total) by mouth daily., Disp: 30 tablet, Rfl: 0 .  furosemide (LASIX) 20 MG tablet, Take 1 tablet (20 mg total) by mouth daily., Disp: 30 tablet, Rfl: 3 .  guaiFENesin (MUCINEX) 600 MG 12 hr tablet, Take 2 tablets (1,200 mg total) by mouth 2 (two) times daily., Disp: 60 tablet, Rfl: 0 .  SYMBICORT 80-4.5 MCG/ACT inhaler, INHALE 2 PUFFS INTO THE LUNGS 2 TIMES DAILY, Disp: 1 Inhaler, Rfl: 3 .  tiotropium (SPIRIVA) 18 MCG inhalation capsule, Place 1 capsule (18 mcg total) into inhaler and inhale daily., Disp: 30 capsule, Rfl: 12 .  VENTOLIN HFA 108 (90 BASE) MCG/ACT inhaler, INHALE 2 PUFFS INTO THE LUNGS EVERY 6 HOURS AS NEEDED FOR WHEEZING OR SHORTNESS OF BREATH, Disp: 54 each, Rfl: 3 .  nicotine ([employer] NICOTINE TRANSDERMAL SYS) 14 mg/24hr patch, Place 1 patch (14 mg total) onto the skin daily. (Patient not taking: Reported on 01/16/2016), Disp: 14 patch, Rfl: 0 .  nicotine ([employer] NICOTINE) 7 mg/24hr patch, Place 1 patch (7 mg total) onto the skin daily. (Patient not taking: Reported on 01/16/2016), Disp: 14 patch, Rfl: 0 .  nicotine (NICODERM CQ - DOSED IN MG/24 HOURS) 21 mg/24hr patch, Place 1 patch (21 mg total) onto the skin daily. (Patient not taking: Reported on 01/16/2016), Disp: 42 patch, Rfl: 0  Review of Systems Dyspnea on exertion, cough. No sputum production, fevers, chills, hemoptysis. No chest pain, palpitations. No nausea, vomiting, diarrhea, constipation.All other review re negative.    Objective:   Physical Exam Blood pressure  144/86, pulse 89, height 5\' 10"  (1.778 m), weight 250 lb (113.399 kg), SpO2 93 % at rest and 85% on exertion Gen: No apparent distress Neuro: No gross focal deficits. Neck: No JVD, lymphadenopathy, thyromegaly. RS: Clear, no wheeze, crackles CVS: S1-S2 heard, no murmurs rubs gallops. Abdomen: Soft, positive bowel sounds. Extremities: No edema.     Assessment & Plan:  #1 COPD Severe COPD. He  is maintained on Spiriva and Symbicort and is doing well on this. He continues to be on supplemental oxygen. He will continue his inhalers as prescribed. He desatted to 85% on ambulation.  We will reassess his O2 requirements at the time of the next visit.  #2 Severe OSA On autoset CPAP with excellent tolerance and compliance.  #3 Tobacco use History continues to smoke in spite of nicotine patch use. He is trying to cut down and is own to half pack a day. I have encouraged him to quit completely.I will start him on chantix. Total counseling time spent on this 12 minutes.  #4 Pulmonary hypertension I suspect this is secondary to COPD and OSA. He has just started getting optimal treatment for both. We will reevaluate with repeat echo at next visit.  #5 Clearance for dental procedure His dentist Dr. Hoyt Burch is requesting clearance for teeth extraction. He is currently optimized with regard to his COPD, sleep apnea. He is at a higher risk given his lung impairment and OSA but is cleared to undergo the procedure. I would recommend using local anesthesia instead of general anesthesia if possible to prevent perioperative complications.  Plan: - Continue Symbicort and Spiriva. - Continue CPAP - Smoking cessation. Start chantix  Marshell Garfinkel MD [facility] Pager [phone] If no answer or after 3pm call: [phone] 01/16/2016, 2:03 PM

## 2016-01-19 MED FILL — FUROSEMIDE 20 MG TABLET: 20 | 30 days supply | Qty: 30 | Fill #1

## 2016-01-19 MED FILL — SPIRIVA 18 MCG CP-HANDIHALE: 18 | 30 days supply | Qty: 30 | Fill #2

## 2016-01-19 MED FILL — PROAIR HFA 90 MCG INHALER: 108 (90 BAS | 30 days supply | Qty: 9 | Fill #1

## 2016-01-22 MED FILL — CHANTIX STARTING MONTH BOX: 0.5 MG X 11 | 28 days supply | Qty: 53 | Fill #0

## 2016-01-25 ENCOUNTER — Telehealth: Payer: Self-pay | Admitting: Pulmonary Disease

## 2016-01-25 NOTE — Telephone Encounter (Signed)
Pt was seen 01/16/16 by PM and given surgical clearance. Spoke with pt and advised him of clearance and PM's recommendations for anesthesia. Pt asked that we contact dental office to advise.  Spoke with Aldona Bar at dental office and she states that the [facility] note with clearance and recommendations is fine. She asked that we fax that to their office. OV note printed and faxed. Fax confirmation received.   Diona Browner, DDS Fax [phone]

## 2016-02-28 MED FILL — !VENTOLIN HFA INHALER: 108 (90 BAS | 14 days supply | Qty: 1 | Fill #1

## 2016-02-28 MED FILL — FUROSEMIDE 20 MG TABLET: 20 | 30 days supply | Qty: 30 | Fill #2

## 2016-03-01 ENCOUNTER — Other Ambulatory Visit: Payer: Self-pay | Admitting: Pharmacist

## 2016-03-01 MED ORDER — ALBUTEROL SULFATE HFA 108 (90 BASE) MCG/ACT IN AERS: 2.0000 | INHALATION_SPRAY | Freq: Four times a day (QID) | RESPIRATORY_TRACT | Status: DC | PRN

## 2016-03-13 ENCOUNTER — Telehealth: Payer: Self-pay | Admitting: Pulmonary Disease

## 2016-03-13 NOTE — Telephone Encounter (Signed)
LM x 1 

## 2016-03-14 MED ORDER — BUDESONIDE-FORMOTEROL FUMARATE 80-4.5 MCG/ACT IN AERO: 2.0000 | INHALATION_SPRAY | Freq: Two times a day (BID) | RESPIRATORY_TRACT | Status: DC

## 2016-03-14 NOTE — Telephone Encounter (Signed)
Pt returning call and can be reached @[phone].Ricardo Burch

## 2016-03-14 NOTE — Telephone Encounter (Signed)
Called spoke with pt. Informed him that sample is ready and at the front for pick up. He voiced understanding and had no further questions. Sample placed at the front. Nothing further needed.

## 2016-03-18 ENCOUNTER — Encounter (HOSPITAL_COMMUNITY): Payer: Self-pay

## 2016-03-18 ENCOUNTER — Other Ambulatory Visit (HOSPITAL_COMMUNITY): Payer: Self-pay | Admitting: *Deleted

## 2016-03-18 ENCOUNTER — Encounter (HOSPITAL_COMMUNITY)
Admission: RE | Admit: 2016-03-18 | Discharge: 2016-03-18 | Disposition: A | Discharge status: Home / Self Care | Payer: Medicaid Other | Source: Ambulatory Visit | Attending: Oral Surgery | Admitting: Oral Surgery

## 2016-03-18 DIAGNOSIS — K029 Dental caries, unspecified: Secondary | ICD-10-CM | POA: Diagnosis not present

## 2016-03-18 DIAGNOSIS — K0530 Chronic periodontitis, unspecified: Secondary | ICD-10-CM | POA: Diagnosis not present

## 2016-03-18 DIAGNOSIS — I4510 Unspecified right bundle-branch block: Secondary | ICD-10-CM | POA: Diagnosis not present

## 2016-03-18 DIAGNOSIS — Z01818 Encounter for other preprocedural examination: Secondary | ICD-10-CM | POA: Diagnosis present

## 2016-03-18 DIAGNOSIS — Z01812 Encounter for preprocedural laboratory examination: Secondary | ICD-10-CM | POA: Insufficient documentation

## 2016-03-18 HISTORY — DX: Sleep apnea, unspecified: G47.30

## 2016-03-18 LAB — BASIC METABOLIC PANEL
ANION GAP: 11 (ref 5–15)
BUN: 9 mg/dL (ref 6–20)
CHLORIDE: 100 mmol/L — AB (ref 101–111)
CO2: 26 mmol/L (ref 22–32)
Calcium: 8.8 mg/dL — ABNORMAL LOW (ref 8.9–10.3)
Creatinine, Ser: 1.23 mg/dL (ref 0.61–1.24)
GFR calc non Af Amer: >60 mL/min (ref >60)
GLUCOSE: 97 mg/dL (ref 65–99)
POTASSIUM: 3.8 mmol/L (ref 3.5–5.1)
Sodium: 137 mmol/L (ref 135–145)

## 2016-03-18 LAB — CBC
HEMATOCRIT: 49.4 % (ref 39.0–52.0)
HEMOGLOBIN: 16.7 g/dL (ref 13.0–17.0)
MCH: 32.4 pg (ref 26.0–34.0)
MCHC: 33.8 g/dL (ref 30.0–36.0)
MCV: 95.7 fL (ref 78.0–100.0)
Platelets: 191 10*3/uL (ref 150–400)
RBC: 5.16 MIL/uL (ref 4.22–5.81)
RDW: 13.9 % (ref 11.5–15.5)
WBC: 8 10*3/uL (ref 4.0–10.5)

## 2016-03-18 NOTE — Progress Notes (Signed)
Pt. States that he is unsure if he has anyone who can stay with him for 24 hours after surgery. He is going to try and find someone to drive him home and stay with him.  Pt. Ask to inform Dr. Lupita Leash office if he cannot find anyone.Pt. Expresses understanding.

## 2016-03-18 NOTE — Pre-Procedure Instructions (Addendum)
    Ricardo Burch  03/18/2016      [facility] [facility] [place] [address] [place] [address] Phone: [phone] Fax: [phone]  [facility] [place], [place] - [id number] [place] [place] [address] Phone: [phone] Fax: [place], [place] [address] Phone: [phone] Fax: [phone]  [id number] - [place], [place] - [address] [place] [address] Phone: [phone] Fax: [phone]    Your procedure is scheduled on 03-22-2016   Friday    Report to [facility] Admitting at 6:45A.M.   Call this number if you have problems the morning of surgery:  [phone]   Remember:  Do not eat food or drink liquids after midnight.   Take these medicines the morning of surgery with A SIP OF WATER Albuterol inhaler if needed,symbicort inhaler,guaifenesin(Mucinex),spiriva inhalation capsule,   Do not wear jewelry, .  Do not wear lotions, powders, or perfumes.  You may NOT wear deodorant.  Do not shave 48 hours prior to surgery.  Men may shave face and neck.   Do not bring valuables to the hospital.  [facility] is not responsible for any belongings or valuables.  Contacts, dentures or bridgework may not be worn into surgery.  Leave your suitcase in the car.  After surgery it may be brought to your room.  For patients admitted to the hospital, discharge time will be determined by your treatment team.  Patients discharged the day of surgery will not be allowed to drive home.    Special instructions:  See attached Sheet for instructions on CHG shower

## 2016-03-19 NOTE — H&P (Signed)
HISTORY AND PHYSICAL  Ricardo Burch is a 55 y.o. male patient with CC: painful teeth  HPI: Attempted extractions with IV sedation in office setting. Pt had difficult airway and surgery cancelled.  No diagnosis found.  Past Medical History  Diagnosis Date  . Eczema     since childhood   . COPD (chronic obstructive pulmonary disease) ([place]) Dx 2015  . CHF (congestive heart failure) ([place])   . Complication of anesthesia     never been put to sleep  . Sleep apnea     CPAP    No current facility-administered medications for this encounter.   Current Outpatient Prescriptions  Medication Sig Dispense Refill  . albuterol (PROVENTIL HFA;VENTOLIN HFA) 108 (90 Base) MCG/ACT inhaler Inhale 2 puffs into the lungs every 6 (six) hours as needed for wheezing or shortness of breath. 1 Inhaler 0  . aspirin EC 81 MG tablet Take 1 tablet (81 mg total) by mouth daily. 30 tablet 0  . budesonide-formoterol (SYMBICORT) 80-4.5 MCG/ACT inhaler Inhale 2 puffs into the lungs 2 (two) times daily. 1 Inhaler 0  . furosemide (LASIX) 20 MG tablet Take 1 tablet (20 mg total) by mouth daily. 30 tablet 3  . guaiFENesin (MUCINEX) 600 MG 12 hr tablet Take 2 tablets (1,200 mg total) by mouth 2 (two) times daily. 60 tablet 0  . tiotropium (SPIRIVA) 18 MCG inhalation capsule Place 1 capsule (18 mcg total) into inhaler and inhale daily. 30 capsule 12  . varenicline (CHANTIX STARTING MONTH PAK) 0.5 MG X 11 & 1 MG X 42 tablet Take one 0.5 mg tablet by mouth once daily for 3 days, then increase to one 0.5 mg tablet twice daily for 4 days, then increase to one 1 mg tablet twice daily. 53 tablet 0  . nicotine ([employer] NICOTINE TRANSDERMAL SYS) 14 mg/24hr patch Place 1 patch (14 mg total) onto the skin daily. (Patient not taking: Reported on 01/16/2016) 14 patch 0  . nicotine ([facility] NICOTINE) 7 mg/24hr patch Place 1 patch (7 mg total) onto the skin daily. (Patient not taking: Reported on 01/16/2016) 14 patch 0  . nicotine (NICODERM CQ - DOSED  IN MG/24 HOURS) 21 mg/24hr patch Place 1 patch (21 mg total) onto the skin daily. (Patient not taking: Reported on 01/16/2016) 42 patch 0  . SYMBICORT 80-4.5 MCG/ACT inhaler INHALE 2 PUFFS INTO THE LUNGS 2 TIMES DAILY (Patient not taking: Reported on 03/15/2016) 1 Inhaler 3  . varenicline (CHANTIX CONTINUING MONTH PAK) 1 MG tablet Take 1 tablet (1 mg total) by mouth 2 (two) times daily. (Patient not taking: Reported on 03/15/2016) 60 tablet 3   No Known Allergies Active Problems:   * No active hospital problems. *  Vitals: There were no vitals taken for this visit. Lab results: Results for orders placed or performed during the hospital encounter of 03/18/16 (from the past 24 hour(s))  Basic metabolic panel     Status: Abnormal   Collection Time: 03/18/16 12:34 PM  Result Value Ref Range   Sodium 137 135 - 145 mmol/L   Potassium 3.8 3.5 - 5.1 mmol/L   Chloride 100 (L) 101 - 111 mmol/L   CO2 26 22 - 32 mmol/L   Glucose, Bld 97 65 - 99 mg/dL   BUN 9 6 - 20 mg/dL   Creatinine, Ser 1.23 0.61 - 1.24 mg/dL   Calcium 8.8 (L) 8.9 - 10.3 mg/dL   GFR calc non Af Amer >60 >60 mL/min   GFR calc Af Amer >60 >60  mL/min   Anion gap 11 5 - 15  CBC     Status: None   Collection Time: 03/18/16 12:34 PM  Result Value Ref Range   WBC 8.0 4.0 - 10.5 K/uL   RBC 5.16 4.22 - 5.81 MIL/uL   Hemoglobin 16.7 13.0 - 17.0 g/dL   HCT 49.4 39.0 - 52.0 %   MCV 95.7 78.0 - 100.0 fL   MCH 32.4 26.0 - 34.0 pg   MCHC 33.8 30.0 - 36.0 g/dL   RDW 13.9 11.5 - 15.5 %   Platelets 191 150 - 400 K/uL   Radiology Results: No results found. General appearance: alert, cooperative, no distress and moderately obese Head: Normocephalic, without obvious abnormality, atraumatic Eyes: negative Nose: Nares normal. Septum midline. Mucosa normal. No drainage or sinus tenderness. Throat: multiple dental caries. bilateral mandibular lingual tori; pharynx clear Neck: no adenopathy, supple, symmetrical, trachea midline and thyroid not  enlarged, symmetric, no tenderness/mass/nodules Resp: clear to auscultation bilaterally Cardio: regular rate and rhythm, S1, S2 normal, no murmur, click, rub or gallop  Assessment: Multiple nonrestorable teeth. Bilateral mandibular lingual tori.  Plan:Multiple extractions with alveoloplasty. Removal mandibular lingual tori. GA. Day surgery.   Gae Bon 03/19/2016

## 2016-03-19 NOTE — Progress Notes (Signed)
Anesthesia Chart Review:  Pt is a 55 year old male scheduled for multiple dental extractions with alveoloplasty on 03/22/2016 with Dr. Hoyt Koch.   PCP is Dr. Boykin Nearing. Pulmologist is Dr. Marshell Garfinkel who cleared pt for surgery noting: "He is at a higher risk given his lung impairment and OSA but is cleared to undergo the procedure. I would recommend using local anesthesia instead of general anesthesia if possible to prevent perioperative complications."   PMH includes:  CHF, OSA (severe, on CPAP), COPD. Current smoker. BMI 39  Medications include: albuterol, ASA, symbicort, lasix, spiriva, chantix  Preoperative labs reviewed.    Chest x-ray 09/16/15 reviewed.  1. Hyperinflation. 2. Stable cardiomegaly.  EKG 03/18/16: NSR. RBBB.   PFTs 01/16/16 - FVC 2.43 [58%] - FEV1 1.49 [45%) - F/F 61 - TLC 81% - DLCO 56% - Severe obstruction, moderate reduction in diffusion capacity.  Echo 09/18/15:  - Left ventricle: The cavity size was normal. There was moderate concentric hypertrophy. Systolic function was normal. The estimated ejection fraction was in the range of 60% to 65%. Wall motion was normal; there were no regional wall motion abnormalities. Features are consistent with a pseudonormal left ventricular filling pattern, with concomitant abnormal relaxation and increased filling pressure (grade 2 diastolic dysfunction). Doppler parameters are consistent with high ventricular filling pressure. - Mitral valve: Calcified annulus. - Right ventricle: The cavity size was severely dilated. Wall thickness was normal. Systolic function was moderately to severely reduced. - Right atrium: The atrium was moderately to severely dilated. - Tricuspid valve: There was mild regurgitation. - Pulmonary arteries: PA peak pressure: 56 mm Hg (S). - Impressions: The right ventricular systolic pressure was increased consistent with moderate pulmonary hypertension.  If no changes, I anticipate pt can proceed  with surgery as scheduled.   Willeen Cass, FNP-BC [facility] Phone: [phone] 03/19/2016 1:20 PM

## 2016-03-21 MED ORDER — CEFAZOLIN SODIUM-DEXTROSE 2-4 GM/100ML-% IV SOLN
2.0000 g | INTRAVENOUS | Status: AC
  Administered 2016-03-22: 2 g via INTRAVENOUS
  Filled 2016-03-21: qty 100

## 2016-03-22 ENCOUNTER — Encounter (HOSPITAL_COMMUNITY)
Admission: RE | Disposition: A | Discharge status: Home / Self Care | Payer: Self-pay | Source: Ambulatory Visit | Attending: Oral Surgery

## 2016-03-22 ENCOUNTER — Ambulatory Visit (HOSPITAL_COMMUNITY)
Admission: RE | Admit: 2016-03-22 | Discharge: 2016-03-22 | Disposition: A | Discharge status: Home / Self Care | Payer: Medicaid Other | Source: Ambulatory Visit | Attending: Oral Surgery | Admitting: Oral Surgery

## 2016-03-22 ENCOUNTER — Ambulatory Visit (HOSPITAL_COMMUNITY): Payer: Medicaid Other | Admitting: Emergency Medicine

## 2016-03-22 ENCOUNTER — Ambulatory Visit (HOSPITAL_COMMUNITY): Payer: Medicaid Other | Admitting: Anesthesiology

## 2016-03-22 ENCOUNTER — Encounter (HOSPITAL_COMMUNITY): Payer: Self-pay | Admitting: *Deleted

## 2016-03-22 DIAGNOSIS — Z79899 Other long term (current) drug therapy: Secondary | ICD-10-CM | POA: Insufficient documentation

## 2016-03-22 DIAGNOSIS — G4730 Sleep apnea, unspecified: Secondary | ICD-10-CM | POA: Insufficient documentation

## 2016-03-22 DIAGNOSIS — Z7982 Long term (current) use of aspirin: Secondary | ICD-10-CM | POA: Diagnosis not present

## 2016-03-22 DIAGNOSIS — Z6838 Body mass index (BMI) 38.0-38.9, adult: Secondary | ICD-10-CM | POA: Insufficient documentation

## 2016-03-22 DIAGNOSIS — Z7951 Long term (current) use of inhaled steroids: Secondary | ICD-10-CM | POA: Diagnosis not present

## 2016-03-22 DIAGNOSIS — I509 Heart failure, unspecified: Secondary | ICD-10-CM | POA: Insufficient documentation

## 2016-03-22 DIAGNOSIS — F17200 Nicotine dependence, unspecified, uncomplicated: Secondary | ICD-10-CM | POA: Insufficient documentation

## 2016-03-22 DIAGNOSIS — Z9989 Dependence on other enabling machines and devices: Secondary | ICD-10-CM | POA: Diagnosis not present

## 2016-03-22 DIAGNOSIS — K0889 Other specified disorders of teeth and supporting structures: Secondary | ICD-10-CM | POA: Diagnosis present

## 2016-03-22 DIAGNOSIS — M278 Other specified diseases of jaws: Secondary | ICD-10-CM | POA: Insufficient documentation

## 2016-03-22 DIAGNOSIS — J449 Chronic obstructive pulmonary disease, unspecified: Secondary | ICD-10-CM | POA: Diagnosis not present

## 2016-03-22 DIAGNOSIS — K029 Dental caries, unspecified: Secondary | ICD-10-CM | POA: Insufficient documentation

## 2016-03-22 HISTORY — PX: MULTIPLE EXTRACTIONS WITH ALVEOLOPLASTY: SHX5342

## 2016-03-22 SURGERY — MULTIPLE EXTRACTION WITH ALVEOLOPLASTY
Anesthesia: General | Site: Mouth

## 2016-03-22 MED ORDER — ONDANSETRON HCL 4 MG/2ML IJ SOLN
INTRAMUSCULAR | Status: AC
  Filled 2016-03-22: qty 2

## 2016-03-22 MED ORDER — OXYMETAZOLINE HCL 0.05 % NA SOLN
NASAL | Status: DC | PRN
  Administered 2016-03-22: 1

## 2016-03-22 MED ORDER — SUCCINYLCHOLINE CHLORIDE 200 MG/10ML IV SOSY
PREFILLED_SYRINGE | INTRAVENOUS | Status: AC
  Filled 2016-03-22: qty 10

## 2016-03-22 MED ORDER — 0.9 % SODIUM CHLORIDE (POUR BTL) OPTIME
TOPICAL | Status: DC | PRN
  Administered 2016-03-22: 1000 mL

## 2016-03-22 MED ORDER — OXYCODONE HCL 5 MG/5ML PO SOLN: 10.0000 mg | Freq: Once | ORAL | Status: DC | PRN

## 2016-03-22 MED ORDER — ROCURONIUM BROMIDE 100 MG/10ML IV SOLN
INTRAVENOUS | Status: DC | PRN
  Administered 2016-03-22: 30 mg via INTRAVENOUS

## 2016-03-22 MED ORDER — IPRATROPIUM-ALBUTEROL 0.5-2.5 (3) MG/3ML IN SOLN
RESPIRATORY_TRACT | Status: AC
  Administered 2016-03-22: 3 mL via RESPIRATORY_TRACT
  Filled 2016-03-22: qty 3

## 2016-03-22 MED ORDER — DEXAMETHASONE SODIUM PHOSPHATE 10 MG/ML IJ SOLN
INTRAMUSCULAR | Status: AC
  Filled 2016-03-22: qty 1

## 2016-03-22 MED ORDER — PHENYLEPHRINE 40 MCG/ML (10ML) SYRINGE FOR IV PUSH (FOR BLOOD PRESSURE SUPPORT)
PREFILLED_SYRINGE | INTRAVENOUS | Status: AC
  Filled 2016-03-22: qty 10

## 2016-03-22 MED ORDER — PROPOFOL 10 MG/ML IV BOLUS
INTRAVENOUS | Status: AC
  Filled 2016-03-22: qty 20

## 2016-03-22 MED ORDER — LABETALOL HCL 5 MG/ML IV SOLN
INTRAVENOUS | Status: AC
  Filled 2016-03-22: qty 4

## 2016-03-22 MED ORDER — NOREPINEPHRINE BITARTRATE 1 MG/ML IV SOLN
0.0000 ug/min | INTRAVENOUS | Status: AC
  Administered 2016-03-22: 1 ug/min via INTRAVENOUS
  Filled 2016-03-22: qty 4

## 2016-03-22 MED ORDER — ONDANSETRON HCL 4 MG/2ML IJ SOLN: 4.0000 mg | Freq: Once | INTRAMUSCULAR | Status: DC | PRN

## 2016-03-22 MED ORDER — OXYCODONE HCL 5 MG PO TABS: 5.0000 mg | ORAL_TABLET | Freq: Once | ORAL | Status: DC | PRN

## 2016-03-22 MED ORDER — ONDANSETRON HCL 4 MG/2ML IJ SOLN
INTRAMUSCULAR | Status: DC | PRN
  Administered 2016-03-22: 4 mg via INTRAVENOUS

## 2016-03-22 MED ORDER — FENTANYL CITRATE (PF) 100 MCG/2ML IJ SOLN
25.0000 ug | INTRAMUSCULAR | Status: DC | PRN
  Administered 2016-03-22 (×4): 25 ug via INTRAVENOUS

## 2016-03-22 MED ORDER — KETOROLAC TROMETHAMINE 15 MG/ML IJ SOLN
15.0000 mg | Freq: Once | INTRAMUSCULAR | Status: AC
  Administered 2016-03-22: 15 mg via INTRAVENOUS

## 2016-03-22 MED ORDER — SUCCINYLCHOLINE CHLORIDE 20 MG/ML IJ SOLN
INTRAMUSCULAR | Status: DC | PRN
  Administered 2016-03-22: 100 mg via INTRAVENOUS

## 2016-03-22 MED ORDER — LABETALOL HCL 5 MG/ML IV SOLN
INTRAVENOUS | Status: DC | PRN
  Administered 2016-03-22 (×2): 5 mg via INTRAVENOUS
  Administered 2016-03-22: 10 mg via INTRAVENOUS

## 2016-03-22 MED ORDER — KETOROLAC TROMETHAMINE 15 MG/ML IJ SOLN
INTRAMUSCULAR | Status: AC
  Filled 2016-03-22: qty 1

## 2016-03-22 MED ORDER — ALBUTEROL SULFATE HFA 108 (90 BASE) MCG/ACT IN AERS
INHALATION_SPRAY | RESPIRATORY_TRACT | Status: AC
  Filled 2016-03-22: qty 6.7

## 2016-03-22 MED ORDER — LACTATED RINGERS IV SOLN
INTRAVENOUS | Status: DC
  Administered 2016-03-22 (×2): via INTRAVENOUS

## 2016-03-22 MED ORDER — ALBUTEROL SULFATE (2.5 MG/3ML) 0.083% IN NEBU
INHALATION_SOLUTION | RESPIRATORY_TRACT | Status: AC
  Administered 2016-03-22: 2.5 mg via RESPIRATORY_TRACT
  Filled 2016-03-22: qty 3

## 2016-03-22 MED ORDER — OXYCODONE-ACETAMINOPHEN 5-325 MG PO TABS: 1.0000 | ORAL_TABLET | ORAL | Status: DC | PRN

## 2016-03-22 MED ORDER — SUGAMMADEX SODIUM 200 MG/2ML IV SOLN
INTRAVENOUS | Status: AC
  Filled 2016-03-22: qty 2

## 2016-03-22 MED ORDER — SUGAMMADEX SODIUM 500 MG/5ML IV SOLN
INTRAVENOUS | Status: AC
  Filled 2016-03-22: qty 5

## 2016-03-22 MED ORDER — DEXAMETHASONE SODIUM PHOSPHATE 10 MG/ML IJ SOLN
INTRAMUSCULAR | Status: DC | PRN
  Administered 2016-03-22: 10 mg via INTRAVENOUS

## 2016-03-22 MED ORDER — LIDOCAINE-EPINEPHRINE 2 %-1:100000 IJ SOLN
INTRAMUSCULAR | Status: AC
  Filled 2016-03-22: qty 1

## 2016-03-22 MED ORDER — FENTANYL CITRATE (PF) 250 MCG/5ML IJ SOLN
INTRAMUSCULAR | Status: AC
  Filled 2016-03-22: qty 5

## 2016-03-22 MED ORDER — OXYMETAZOLINE HCL 0.05 % NA SOLN
NASAL | Status: DC | PRN
  Administered 2016-03-22: 2 via NASAL

## 2016-03-22 MED ORDER — PHENYLEPHRINE HCL 10 MG/ML IJ SOLN
INTRAMUSCULAR | Status: DC | PRN
  Administered 2016-03-22 (×2): 120 ug via INTRAVENOUS

## 2016-03-22 MED ORDER — FENTANYL CITRATE (PF) 100 MCG/2ML IJ SOLN
INTRAMUSCULAR | Status: AC
  Filled 2016-03-22: qty 2

## 2016-03-22 MED ORDER — SUGAMMADEX SODIUM 200 MG/2ML IV SOLN
INTRAVENOUS | Status: DC | PRN
  Administered 2016-03-22: 400 mg via INTRAVENOUS

## 2016-03-22 MED ORDER — FENTANYL CITRATE (PF) 250 MCG/5ML IJ SOLN
INTRAMUSCULAR | Status: DC | PRN
  Administered 2016-03-22: 100 ug via INTRAVENOUS

## 2016-03-22 MED ORDER — PROPOFOL 10 MG/ML IV BOLUS
INTRAVENOUS | Status: DC | PRN
  Administered 2016-03-22 (×2): 50 mg via INTRAVENOUS
  Administered 2016-03-22: 100 mg via INTRAVENOUS

## 2016-03-22 MED ORDER — SODIUM CHLORIDE 0.9 % IR SOLN
Status: DC | PRN
  Administered 2016-03-22: 1000 mL

## 2016-03-22 MED ORDER — LIDOCAINE HCL (CARDIAC) 20 MG/ML IV SOLN
INTRAVENOUS | Status: DC | PRN
  Administered 2016-03-22: 100 mg via INTRATRACHEAL

## 2016-03-22 MED ORDER — LIDOCAINE-EPINEPHRINE 2 %-1:100000 IJ SOLN
INTRAMUSCULAR | Status: DC | PRN
  Administered 2016-03-22: 20 mL

## 2016-03-22 SURGICAL SUPPLY — 27 items
BUR CROSS CUT FISSURE 1.6 (BURR) ×2 IMPLANT
BUR EGG ELITE 4.0 (BURR) ×2 IMPLANT
CANISTER SUCTION 2500CC (MISCELLANEOUS) ×2 IMPLANT
COVER SURGICAL LIGHT HANDLE (MISCELLANEOUS) ×2 IMPLANT
CRADLE DONUT ADULT HEAD (MISCELLANEOUS) ×2 IMPLANT
DRAPE U-SHAPE 76X120 STRL (DRAPES) ×2 IMPLANT
FLUID NSS /IRRIG 1000 ML XXX (MISCELLANEOUS) ×2 IMPLANT
GAUZE PACKING FOLDED 2  STR (GAUZE/BANDAGES/DRESSINGS) ×1
GAUZE PACKING FOLDED 2 STR (GAUZE/BANDAGES/DRESSINGS) ×1 IMPLANT
GLOVE BIO SURGEON STRL SZ 6.5 (GLOVE) ×2 IMPLANT
GLOVE BIO SURGEON STRL SZ7.5 (GLOVE) ×2 IMPLANT
GLOVE BIOGEL PI IND STRL 7.0 (GLOVE) ×1 IMPLANT
GLOVE BIOGEL PI INDICATOR 7.0 (GLOVE) ×1
GOWN STRL REUS W/ TWL LRG LVL3 (GOWN DISPOSABLE) ×1 IMPLANT
GOWN STRL REUS W/ TWL XL LVL3 (GOWN DISPOSABLE) ×1 IMPLANT
GOWN STRL REUS W/TWL LRG LVL3 (GOWN DISPOSABLE) ×1
GOWN STRL REUS W/TWL XL LVL3 (GOWN DISPOSABLE) ×1
KIT BASIN OR (CUSTOM PROCEDURE TRAY) ×2 IMPLANT
KIT ROOM TURNOVER OR (KITS) ×2 IMPLANT
NEEDLE 22X1 1/2 (OR ONLY) (NEEDLE) ×4 IMPLANT
NS IRRIG 1000ML POUR BTL (IV SOLUTION) ×2 IMPLANT
PAD ARMBOARD 7.5X6 YLW CONV (MISCELLANEOUS) ×2 IMPLANT
SUT CHROMIC 3 0 PS 2 (SUTURE) ×4 IMPLANT
SYR CONTROL 10ML LL (SYRINGE) ×2 IMPLANT
TRAY ENT MC OR (CUSTOM PROCEDURE TRAY) ×2 IMPLANT
TUBING IRRIGATION (MISCELLANEOUS) ×2 IMPLANT
YANKAUER SUCT BULB TIP NO VENT (SUCTIONS) ×2 IMPLANT

## 2016-03-22 NOTE — Anesthesia Preprocedure Evaluation (Addendum)
Anesthesia Evaluation  Patient identified by MRN, date of birth, ID band Patient awake    Reviewed: Allergy & Precautions, H&P , NPO status , Patient's Chart, lab work & pertinent test results  History of Anesthesia Complications Negative for: history of anesthetic complications  Airway Mallampati: II  TM Distance: >3 FB Neck ROM: full    Dental  (+) Poor Dentition, Dental Advisory Given   Pulmonary sleep apnea, Continuous Positive Airway Pressure Ventilation and Oxygen sleep apnea , COPD,  COPD inhaler and oxygen dependent, Current Smoker,    + rhonchi  (-) wheezing      Cardiovascular +CHF  Normal cardiovascular exam Rhythm:regular Rate:Normal     Neuro/Psych negative neurological ROS     GI/Hepatic negative GI ROS, Neg liver ROS,   Endo/Other  Morbid obesity  Renal/GU negative Renal ROS     Musculoskeletal   Abdominal   Peds  Hematology negative hematology ROS (+)   Anesthesia Other Findings OSA severe with AHI over 100, home oxygen with moderate PA pressures that have caused right heart moderate to severe dysfunction  Attempted local anesthesia to do this procedure in office and it was unsuccessful  Reproductive/Obstetrics negative OB ROS                         Anesthesia Physical Anesthesia Plan  ASA: IV  Anesthesia Plan: General   Post-op Pain Management:    Induction: Intravenous  Airway Management Planned: Oral ETT  Additional Equipment: Arterial line  Intra-op Plan:   Post-operative Plan: Possible Post-op intubation/ventilation  Informed Consent: I have reviewed the patients History and Physical, chart, labs and discussed the procedure including the risks, benefits and alternatives for the proposed anesthesia with the patient or authorized representative who has indicated his/her understanding and acceptance.   Dental Advisory Given  Plan Discussed with:  Anesthesiologist, CRNA and Surgeon  Anesthesia Plan Comments: (Patient will need A line, GA with RSI intubation, have norepinephrine in line for vasopressor and inotropic support of right heart Will attempt to extubate to CPAP)      Anesthesia Quick Evaluation

## 2016-03-22 NOTE — Anesthesia Postprocedure Evaluation (Signed)
Anesthesia Post Note  Patient: Ricardo Burch  Procedure(s) Performed: Procedure(s) (LRB): MULTIPLE EXTRACTION WITH ALVEOLOPLASTY (N/A)  Patient location during evaluation: PACU Anesthesia Type: General Level of consciousness: awake and alert Pain management: pain level controlled Vital Signs Assessment: post-procedure vital signs reviewed and stable Respiratory status: spontaneous breathing, nonlabored ventilation, respiratory function stable and patient connected to nasal cannula oxygen Cardiovascular status: blood pressure returned to baseline and stable Postop Assessment: no signs of nausea or vomiting Anesthetic complications: no Comments: Extubated to CPAP, following commands with appropriate vital signs.. Doing great post op despite challenging intraop anesthetic due to COPD, OSA, morbid obesity    Last Vitals:  Filed Vitals:   03/22/16 1129 03/22/16 1130  BP: 119/84   Pulse: 77 78  Temp:    Resp: 10 11    Last Pain:  Filed Vitals:   03/22/16 1134  PainSc: 4                  [name]

## 2016-03-22 NOTE — Transfer of Care (Signed)
Immediate Anesthesia Transfer of Care Note  Patient: Ricardo Burch  Procedure(s) Performed: Procedure(s): MULTIPLE EXTRACTION WITH ALVEOLOPLASTY (N/A)  Patient Location: PACU  Anesthesia Type:General  Level of Consciousness: awake  Airway & Oxygen Therapy: Patient Spontanous Breathing and cpap  Post-op Assessment: Report given to RN and Post -op Vital signs reviewed and stable  Post vital signs: Reviewed and stable  Last Vitals:  Filed Vitals:   03/22/16 0647  BP: 184/113  Pulse: 89  Temp: 36.8 C  Resp: 20    Last Pain: There were no vitals filed for this visit.       Complications: No apparent anesthesia complications

## 2016-03-22 NOTE — H&P (Signed)
History and Physical Exam reviewed; patient is OK for planned anesthetic and procedure.  

## 2016-03-22 NOTE — Anesthesia Procedure Notes (Signed)
Procedure Name: Intubation Date/Time: 03/22/2016 9:14 AM Performed by: Marinda Elk A Pre-anesthesia Checklist: Patient identified, Emergency Drugs available, Suction available, Patient being monitored and Timeout performed Patient Re-evaluated:Patient Re-evaluated prior to inductionOxygen Delivery Method: Circle System Utilized and Circle system utilized Preoxygenation: Pre-oxygenation with 100% oxygen Intubation Type: IV induction Ventilation: Mask ventilation with difficulty, Two handed mask ventilation required and Oral airway inserted - appropriate to patient size Grade View: Grade III Tube type: Oral Tube size: 7.5 mm Number of attempts: 4 Airway Equipment and Method: Stylet,  Oral airway and Video-laryngoscopy Placement Confirmation: ETT inserted through vocal cords under direct vision,  positive ETCO2 and breath sounds checked- equal and bilateral Secured at: 23 cm Tube secured with: Tape Dental Injury: Teeth and Oropharynx as per pre-operative assessment  Future Recommendations: Recommend- induction with short-acting agent, and alternative techniques readily available Comments: DL #1 by me, nasal, mac 3 unable to pass ETT.  DL #2 Dr. Jillyn Hidden nasal, mac 3 unable to pass tube, DL #3 oral, mac3 Dr. Jillyn Hidden, unable to visualize and pass ett. DL#4 glidescope, Dr. Jillyn Hidden, ETT [place] successfully

## 2016-03-22 NOTE — H&P (Signed)
H&P documentation  -History and Physical Reviewed  -Patient has been re-examined  -No change in the plan of care  Ricardo Burch M  

## 2016-03-22 NOTE — Op Note (Signed)
03/22/2016  10:11 AM  PATIENT:  [name]  55 y.o. male  PRE-OPERATIVE DIAGNOSIS:  NON RESTORABLE TEETH # 3, 5, 6, 7, 11, 12, 13, 14, 15, 17, 18, 23, 24, 25, 27, 31, 32, Bilateral  Mandibular lingual tori  POST-OPERATIVE DIAGNOSIS:  Same + tooth # 17 not present   PROCEDURE:  Procedure(s): MULTIPLE EXTRACTION teeth # 3, 5, 6, 7, 11, 12, 13, 14, 15, 18, 23, 24, 25, 27, 31, 32, ALVEOLOPLASTY RIGHT AND LEFT MAXILLA, LEFT MANDIBLE; REMOVAL LEFT MANDIBULAR LINGUAL TORUS  SURGEON:  Surgeon(s): Diona Browner, DDS  ANESTHESIA:   local and general  EBL:  minimal  DRAINS: none   SPECIMEN:  No Specimen  COUNTS:  YES  PLAN OF CARE: Discharge to home after PACU  PATIENT DISPOSITION:  PACU - hemodynamically stable.   PROCEDURE DETAILS: Dictation # [id number]  Gae Bon, DMD 03/22/2016 10:11 AM

## 2016-03-23 NOTE — Op Note (Signed)
NAME:  Ricardo Burch, Ricardo Burch                  ACCOUNT NO.:  [id number]  MEDICAL RECORD NO.:  [id number]  LOCATION:  [facility]                         FACILITY:  [facility]  PHYSICIAN:  Gae Bon, M.D.  DATE OF BIRTH:  1961/06/09  DATE OF PROCEDURE:  03/22/2016 DATE OF DISCHARGE:  03/22/2016                              OPERATIVE REPORT   PREOPERATIVE DIAGNOSES:  Nonrestorable teeth numbers 3, 5, 6, 7, 11, 12, 13, 14, 15, 17, 18, 23, 24, 25, 27, 31, 32, bilateral mandibular lingual tori.  POSTOPERATIVE DIAGNOSES:  Nonrestorable teeth numbers 3, 5, 6, 7, 11, 12, 13, 14, 15, 18, 23, 24, 25, 27, 31, 32, bilateral mandibular lingual tori, tooth #17 not present.  PROCEDURE:  Extraction of teeth numbers 3, 5, 6, 11, 12, 13, 14, 15, 18, 23, 24, 25, 27, 31, 32.  Alveoplasty right and left maxilla, alveoplasty left mandible, removal of left mandibular lingual tori.  SURGEON:  Gae Bon, M.D.  ANESTHESIA:  General oral intubation.  PROCEDURE IN DETAIL:  The patient was placed on the table in supine position.  General anesthesia was administered and a nasal endotracheal tube was attempted, but an oral endotracheal tube was placed due to airway difficulties.  The tube was secured.  The eyes were protected. The patient was draped for the procedure.  Time-out was performed.  The posterior pharynx was suctioned.  The throat pack was placed.  A 2% lidocaine with 1:100,000 epinephrine was infiltrated in inferior alveolar block on the right and left side and buccal and palatal infiltration in the maxilla.  Total of 20 mL was utilized.  A bite block was placed on the right side of the mouth and a Sweetheart retractor was used to retract the tongue.  A #15 blade used to make an incision around teeth numbers 18, carried forward along the alveolar crest and lingually around teeth numbers 20, 21, 22, and then carried forward both buccally and lingually around teeth #23, 24, and 25.  The periosteum  was reflected from around these teeth with a periosteal elevator.  The teeth were elevated with a 301 elevator and removed from the mouth with a dental forceps.  Then, a 15 blade used to make an incision beginning at tooth #15 and carrying forward both buccally and palatally around teeth numbers 14, 13, 12, and 11.  The periosteum was reflected.  The teeth were elevated with a 301 elevator and removed from the mouth with the dental forceps.  Tooth #11 required additional bone removal and root tip removal during the procedure.  The incisions were extended to allow for adequate visualization of the alveolar ridge of the maxilla and then the alveoplasty was performed using the egg-shaped burr and bone file. Then, the area was closed with 3-0 chromic.  Then, the lingual dissection was carried out for the lingual torus and this was smoothed with the egg-shaped burr and bone file.  Then, the mandibular incision was closed with 3-0 chromic.  The Sweetheart retractor, endotracheal tube, and bite block were repositioned the other side of the mouth.  A 15 blade used to make an incision around teeth numbers 3, 5, 6, and 7 buccally  and palatally with a 15 blade and then around teeth numbers 27, 31, and 32.  The periosteum was reflected from around all these teeth in the mandible.  Tooth #27 was removed.  The crown fractured off when attempting to remove with the Asch forceps.  Then, a full-thickness flap was reflected with a 15 blade and the bone surrounding tooth #27 was removed and the tooth was removed in fragments until eventually the root tip was removed with a root tip pick.  In the area of 31 and 32, both teeth required removal of bone and then the teeth were removed with a 301 elevator in the maxilla.  Teeth numbers 3, 5, 6, and 7 were removed after the Stryker handpiece was used to remove interproximal bone and interseptal bone.  Then, the teeth were removed with the upper forceps. The  sockets were curetted.  The periosteum was reflected on the right side.  Then, the upper and lower jaw and then alveoplasty performed using the egg-shaped burr and bone file.  Then, the right mandible, there was a deep lingual torus.  It was felt this would not preclude fabrication of a partial, so no attempt was made to reduce it.  Then, after everything was sutured, the oral cavity was irrigated and suctioned.  The oral cavity was palpated and found to have good contour, hemostasis, and closure of the oral cavity.  Then, the throat pack was removed and suctioned in the pharynx.  The patient was left in the care of the CRNA and anesthesiologist scheduled for transported to the recovery room.  ESTIMATED BLOOD LOSS:  Minimal.  COMPLICATIONS:  None.  SPECIMENS:  None.     Gae Bon, M.D.     SMJ/MEDQ  D:  03/22/2016  T:  03/23/2016  Job:  [id number]

## 2016-03-26 ENCOUNTER — Encounter (HOSPITAL_COMMUNITY): Payer: Self-pay | Admitting: Oral Surgery

## 2016-04-18 ENCOUNTER — Telehealth: Payer: Self-pay | Admitting: Pulmonary Disease

## 2016-04-18 ENCOUNTER — Ambulatory Visit: Payer: Medicaid Other | Admitting: Pulmonary Disease

## 2016-04-18 NOTE — Telephone Encounter (Signed)
1 sample of Symbicort 80 given to the pt  Nothing further needed

## 2016-04-24 MED FILL — ?FUROSEMIDE 20 MG TABLET: 20 | 30 days supply | Qty: 30 | Fill #3

## 2016-04-29 ENCOUNTER — Telehealth: Payer: Self-pay | Admitting: Pulmonary Disease

## 2016-04-29 NOTE — Telephone Encounter (Signed)
Will send to Jess to follow up on forms.

## 2016-04-30 NOTE — Telephone Encounter (Signed)
Medical Alert Form from [facility] [employer] received and placed in PM's red lookat folder Form filled out; awaiting PM's signature Routing to PM as FYI about the form

## 2016-05-02 NOTE — Telephone Encounter (Signed)
Awaiting PM's return to office.

## 2016-05-10 ENCOUNTER — Other Ambulatory Visit: Payer: Self-pay

## 2016-05-10 MED ORDER — ALBUTEROL SULFATE HFA 108 (90 BASE) MCG/ACT IN AERS: 2.0000 | INHALATION_SPRAY | Freq: Four times a day (QID) | RESPIRATORY_TRACT | Status: DC | PRN

## 2016-05-10 MED ORDER — TIOTROPIUM BROMIDE MONOHYDRATE 18 MCG IN CAPS: 18.0000 ug | ORAL_CAPSULE | Freq: Every day | RESPIRATORY_TRACT | Status: DC

## 2016-05-10 MED ORDER — BUDESONIDE-FORMOTEROL FUMARATE 80-4.5 MCG/ACT IN AERO: 2.0000 | INHALATION_SPRAY | Freq: Two times a day (BID) | RESPIRATORY_TRACT | Status: DC

## 2016-05-14 NOTE — Telephone Encounter (Signed)
Jess please advise if you've received this form back from PM yet.  Thanks!

## 2016-05-15 NOTE — Telephone Encounter (Signed)
Yes, form signed by PM Called spoke with patient - he actually has an appt with PM on 7.17.17 and is okay to get the form at that time Nothing further needed; will sign off

## 2016-05-20 ENCOUNTER — Encounter: Payer: Self-pay | Admitting: Pulmonary Disease

## 2016-05-20 ENCOUNTER — Telehealth: Payer: Self-pay | Admitting: Pulmonary Disease

## 2016-05-20 ENCOUNTER — Ambulatory Visit (INDEPENDENT_AMBULATORY_CARE_PROVIDER_SITE_OTHER): Payer: Medicaid Other | Admitting: Pulmonary Disease

## 2016-05-20 VITALS — BP 138/74 | HR 75 | Ht 67.0 in | Wt 228.9 lb

## 2016-05-20 DIAGNOSIS — J439 Emphysema, unspecified: Secondary | ICD-10-CM

## 2016-05-20 DIAGNOSIS — G4733 Obstructive sleep apnea (adult) (pediatric): Secondary | ICD-10-CM

## 2016-05-20 NOTE — Telephone Encounter (Signed)
Ricardo Burch has the [facility] form for pt. He has an appointment today.  [id number] x1

## 2016-05-20 NOTE — Progress Notes (Signed)
Subjective:    Patient ID: Ricardo Burch, male    DOB: Jul 21, 1961, 55 y.o.   MRN: [id number]  PROBLEM LIST: Severe COPD Severe OSA on autoset Active smoker  HPI Ricardo Burch is a 55 year old active smoker, COPD. He was admitted on 11/16 to [facility] for dyspnea, lower extremity edema. He was admitted to stepdown and required BiPAP for CO2 retention and altered mental status.This treated with antibiotics, nebulizers, steroids. Get an echocardiogram that showed moderate pulmonary hypertension.   Since discharge he continues to do well. He denies any cough, sputum production, wheezing. He is using the supplemental oxygen at 2 L/m which he uses intermittently. He does sleep study done which showed severe OSA and was started on an AutoSet CPAP. He is tolerating it well and feels this made a great difference in his sleep quality.   DATA: CXR  (09/15/15) 1. Hyperinflation. 2. Stable cardiomegaly.  Echo (09/18/15) The right ventricular systolic pressure was increased consistent with moderate pulmonary hypertension.moderate concentric LVH. LVEF 60-65 percent. Grade 2 diastolic dysfunction. RV cavity size severe grade dilated. RV systolic function moderately to severely reduced. PA pressure 56  Sleep study 10/22/15 Severe OSA, AHI 151. Started on an AutoSet  CPAP compliance report 04/16/16-05/15/16 23% usage greater than 4 hours. Pressure 5 AHI 3.9  PFTs 01/16/16 FVC 2.43 [58%] FEV1 1.49 [45%) F/F 61 TLC 81% DLCO 56% Severe obstruction, moderate reduction in diffusion capacity.  Social History: He still continues to smoke but is trying to cut down using nicotine patches. He is now down to about 1 cig a day.  Family History: Father-cerebral aneurysm.  Past Medical History  Diagnosis Date  . Eczema     since childhood   . COPD (chronic obstructive pulmonary disease) ([place]) Dx 2015  . CHF (congestive heart failure) ([place])   . Complication of anesthesia     never been put  to sleep  . Sleep apnea     CPAP    Current outpatient prescriptions:  .  albuterol (PROVENTIL HFA;VENTOLIN HFA) 108 (90 Base) MCG/ACT inhaler, Inhale 2 puffs into the lungs every 6 (six) hours as needed for wheezing or shortness of breath., Disp: 54 Inhaler, Rfl: 3 .  aspirin EC 81 MG tablet, Take 1 tablet (81 mg total) by mouth daily., Disp: 30 tablet, Rfl: 0 .  budesonide-formoterol (SYMBICORT) 80-4.5 MCG/ACT inhaler, Inhale 2 puffs into the lungs 2 (two) times daily., Disp: 3 Inhaler, Rfl: 3 .  furosemide (LASIX) 20 MG tablet, Take 1 tablet (20 mg total) by mouth daily., Disp: 30 tablet, Rfl: 3 .  guaiFENesin (MUCINEX) 600 MG 12 hr tablet, Take 2 tablets (1,200 mg total) by mouth 2 (two) times daily., Disp: 60 tablet, Rfl: 0 .  SYMBICORT 80-4.5 MCG/ACT inhaler, INHALE 2 PUFFS INTO THE LUNGS 2 TIMES DAILY, Disp: 1 Inhaler, Rfl: 3 .  tiotropium (SPIRIVA) 18 MCG inhalation capsule, Place 1 capsule (18 mcg total) into inhaler and inhale daily., Disp: 90 capsule, Rfl: 3 .  varenicline (CHANTIX CONTINUING MONTH PAK) 1 MG tablet, Take 1 tablet (1 mg total) by mouth 2 (two) times daily., Disp: 60 tablet, Rfl: 3 .  varenicline (CHANTIX STARTING MONTH PAK) 0.5 MG X 11 & 1 MG X 42 tablet, Take one 0.5 mg tablet by mouth once daily for 3 days, then increase to one 0.5 mg tablet twice daily for 4 days, then increase to one 1 mg tablet twice daily. (Patient not taking: Reported on 05/20/2016), Disp: 53 tablet, Rfl: 0  Review of Systems Dyspnea on exertion, cough. No sputum production, fevers, chills, hemoptysis. No chest pain, palpitations. No nausea, vomiting, diarrhea, constipation.All other review re negative.    Objective:   Physical Exam Blood pressure 138/74, pulse 75, height 5\' 7"  (1.702 m), weight 228 lb 14.4 oz (103.828 kg), SpO2 94 %. Gen: No apparent distress Neuro: No gross focal deficits. Neck: No JVD, lymphadenopathy, thyromegaly. RS: Clear, no wheeze, crackles CVS: S1-S2 heard, no  murmurs rubs gallops. Abdomen: Soft, positive bowel sounds. Extremities: No edema.     Assessment & Plan:  #1 COPD Severe COPD. He is maintained on Spiriva and Symbicort and is doing well on this. He continues to be on supplemental oxygen. He will continue his inhalers as prescribed.   #2 Severe OSA On autoset CPAP. His compliance has worsened since last visit. I reiterated the need to be on CPAP every night.   #3 Tobacco use History continues to smoke in spite of nicotine patch use. He is down to 1 cig/day on chantix. I encouraged him to quit completely.  #4 Pulmonary hypertension I suspect this is secondary to COPD and OSA. We will reevaluate with repeat echo at next visit.  Plan: - Continue Symbicort and Spiriva. - Continue CPAP - Smoking cessation. Continue chantix  Ricardo Garfinkel MD  Pulmonary and Critical Care Pager [phone] If no answer or after 3pm call: [phone] 05/20/2016, 3:45 PM

## 2016-05-20 NOTE — Patient Instructions (Signed)
Continue the inhaler. Please start using the CPAP everyday. Encouraged to quit smoking.  Return in 6 months

## 2016-05-21 MED ORDER — BUDESONIDE-FORMOTEROL FUMARATE 80-4.5 MCG/ACT IN AERO: 2.0000 | INHALATION_SPRAY | Freq: Two times a day (BID) | RESPIRATORY_TRACT | Status: DC

## 2016-05-21 NOTE — Addendum Note (Signed)
Addended by: Inge Rise on: 05/21/2016 09:31 AM   Modules accepted: Orders

## 2016-05-21 NOTE — Telephone Encounter (Signed)
Ricardo Burch, did this form get completed at the pts visit yesterday? thanks

## 2016-05-23 NOTE — Telephone Encounter (Signed)
Yes, form was completed day of visit and faxed back to [facility] LMOM TCB x1 for [facility] to ensure this was received

## 2016-05-24 NOTE — Telephone Encounter (Signed)
LVM for Ricardo Burch to return call.

## 2016-05-27 NOTE — Telephone Encounter (Signed)
Ricardo Burch was this taken care of at the pts visit?  thanks

## 2016-05-31 NOTE — Telephone Encounter (Signed)
Per my previous documentation, the form was indeed taken care of at that office visit and we have been waiting for confirmation that the fax was received  Because we have NOT heard back from [facility] stating that the fax was not received, will go ahead and sign off.

## 2016-05-31 NOTE — Telephone Encounter (Signed)
Ricardo Burch has this been taken care of?

## 2016-06-21 ENCOUNTER — Other Ambulatory Visit: Payer: Self-pay | Admitting: Family Medicine

## 2016-06-21 DIAGNOSIS — I5032 Chronic diastolic (congestive) heart failure: Secondary | ICD-10-CM

## 2016-06-21 MED ORDER — FUROSEMIDE 20 MG PO TABS: 20.0000 mg | ORAL_TABLET | Freq: Every day | ORAL | 1 refills | Status: DC

## 2016-06-21 MED FILL — ?FUROSEMIDE 20 MG TABLET: 20 | 30 days supply | Qty: 30 | Fill #0

## 2016-06-27 ENCOUNTER — Encounter (HOSPITAL_COMMUNITY): Payer: Self-pay | Admitting: Emergency Medicine

## 2016-06-27 ENCOUNTER — Emergency Department (HOSPITAL_COMMUNITY): Payer: Medicaid Other

## 2016-06-27 ENCOUNTER — Inpatient Hospital Stay (HOSPITAL_COMMUNITY)
Admission: EM | Admit: 2016-06-27 | Discharge: 2016-06-29 | DRG: 291 | Disposition: A | Discharge status: Home / Self Care | Payer: Medicaid Other | Attending: Internal Medicine | Admitting: Internal Medicine

## 2016-06-27 DIAGNOSIS — Z720 Tobacco use: Secondary | ICD-10-CM | POA: Diagnosis not present

## 2016-06-27 DIAGNOSIS — F1010 Alcohol abuse, uncomplicated: Secondary | ICD-10-CM | POA: Diagnosis present

## 2016-06-27 DIAGNOSIS — I5032 Chronic diastolic (congestive) heart failure: Secondary | ICD-10-CM

## 2016-06-27 DIAGNOSIS — Z590 Homelessness: Secondary | ICD-10-CM | POA: Diagnosis not present

## 2016-06-27 DIAGNOSIS — Z7982 Long term (current) use of aspirin: Secondary | ICD-10-CM | POA: Diagnosis not present

## 2016-06-27 DIAGNOSIS — Z7951 Long term (current) use of inhaled steroids: Secondary | ICD-10-CM

## 2016-06-27 DIAGNOSIS — R0602 Shortness of breath: Secondary | ICD-10-CM | POA: Diagnosis not present

## 2016-06-27 DIAGNOSIS — I5023 Acute on chronic systolic (congestive) heart failure: Secondary | ICD-10-CM

## 2016-06-27 DIAGNOSIS — J449 Chronic obstructive pulmonary disease, unspecified: Secondary | ICD-10-CM | POA: Diagnosis present

## 2016-06-27 DIAGNOSIS — Z9119 Patient's noncompliance with other medical treatment and regimen: Secondary | ICD-10-CM | POA: Diagnosis not present

## 2016-06-27 DIAGNOSIS — G4730 Sleep apnea, unspecified: Secondary | ICD-10-CM | POA: Diagnosis present

## 2016-06-27 DIAGNOSIS — Z8249 Family history of ischemic heart disease and other diseases of the circulatory system: Secondary | ICD-10-CM

## 2016-06-27 DIAGNOSIS — I5031 Acute diastolic (congestive) heart failure: Secondary | ICD-10-CM | POA: Diagnosis not present

## 2016-06-27 DIAGNOSIS — Z79899 Other long term (current) drug therapy: Secondary | ICD-10-CM | POA: Diagnosis not present

## 2016-06-27 DIAGNOSIS — J9621 Acute and chronic respiratory failure with hypoxia: Secondary | ICD-10-CM | POA: Diagnosis present

## 2016-06-27 DIAGNOSIS — N183 Chronic kidney disease, stage 3 (moderate): Secondary | ICD-10-CM | POA: Diagnosis present

## 2016-06-27 DIAGNOSIS — I5033 Acute on chronic diastolic (congestive) heart failure: Principal | ICD-10-CM | POA: Diagnosis present

## 2016-06-27 DIAGNOSIS — R7989 Other specified abnormal findings of blood chemistry: Secondary | ICD-10-CM

## 2016-06-27 DIAGNOSIS — F17210 Nicotine dependence, cigarettes, uncomplicated: Secondary | ICD-10-CM | POA: Diagnosis present

## 2016-06-27 DIAGNOSIS — F1910 Other psychoactive substance abuse, uncomplicated: Secondary | ICD-10-CM

## 2016-06-27 DIAGNOSIS — N179 Acute kidney failure, unspecified: Secondary | ICD-10-CM | POA: Diagnosis present

## 2016-06-27 DIAGNOSIS — M7989 Other specified soft tissue disorders: Secondary | ICD-10-CM | POA: Diagnosis not present

## 2016-06-27 DIAGNOSIS — I509 Heart failure, unspecified: Secondary | ICD-10-CM

## 2016-06-27 LAB — CBC
HCT: 49.1 % (ref 39.0–52.0)
Hemoglobin: 16.1 g/dL (ref 13.0–17.0)
MCH: 33.8 pg (ref 26.0–34.0)
MCHC: 32.8 g/dL (ref 30.0–36.0)
MCV: 102.9 fL — AB (ref 78.0–100.0)
PLATELETS: 211 10*3/uL (ref 150–400)
RBC: 4.77 MIL/uL (ref 4.22–5.81)
RDW: 15.9 % — AB (ref 11.5–15.5)
WBC: 11.7 10*3/uL — ABNORMAL HIGH (ref 4.0–10.5)

## 2016-06-27 LAB — COMPREHENSIVE METABOLIC PANEL
ALT: 60 U/L (ref 17–63)
ANION GAP: 7 (ref 5–15)
AST: 84 U/L — ABNORMAL HIGH (ref 15–41)
Albumin: 3.5 g/dL (ref 3.5–5.0)
Alkaline Phosphatase: 90 U/L (ref 38–126)
BUN: 28 mg/dL — ABNORMAL HIGH (ref 6–20)
CHLORIDE: 94 mmol/L — AB (ref 101–111)
CO2: 34 mmol/L — AB (ref 22–32)
Calcium: 7.9 mg/dL — ABNORMAL LOW (ref 8.9–10.3)
Creatinine, Ser: 1.54 mg/dL — ABNORMAL HIGH (ref 0.61–1.24)
GFR, EST AFRICAN AMERICAN: 58 mL/min — AB (ref >60)
GFR, EST NON AFRICAN AMERICAN: 50 mL/min — AB (ref >60)
Glucose, Bld: 139 mg/dL — ABNORMAL HIGH (ref 65–99)
POTASSIUM: 3.5 mmol/L (ref 3.5–5.1)
SODIUM: 135 mmol/L (ref 135–145)
Total Bilirubin: 1.1 mg/dL (ref 0.3–1.2)
Total Protein: 6.9 g/dL (ref 6.5–8.1)

## 2016-06-27 LAB — RAPID URINE DRUG SCREEN, HOSP PERFORMED
AMPHETAMINES: NOT DETECTED
Barbiturates: NOT DETECTED
Benzodiazepines: POSITIVE — AB
Cocaine: POSITIVE — AB
OPIATES: NOT DETECTED
Tetrahydrocannabinol: POSITIVE — AB

## 2016-06-27 LAB — I-STAT TROPONIN, ED: TROPONIN I, POC: 0.03 ng/mL (ref 0.00–0.08)

## 2016-06-27 LAB — BRAIN NATRIURETIC PEPTIDE: B NATRIURETIC PEPTIDE 5: 1435.2 pg/mL — AB (ref 0.0–100.0)

## 2016-06-27 MED ORDER — FUROSEMIDE 10 MG/ML IJ SOLN
40.0000 mg | Freq: Two times a day (BID) | INTRAMUSCULAR | Status: DC
  Administered 2016-06-27 – 2016-06-29 (×4): 40 mg via INTRAVENOUS
  Filled 2016-06-27 (×4): qty 4

## 2016-06-27 MED ORDER — IPRATROPIUM-ALBUTEROL 0.5-2.5 (3) MG/3ML IN SOLN
3.0000 mL | Freq: Once | RESPIRATORY_TRACT | Status: AC
  Administered 2016-06-27: 3 mL via RESPIRATORY_TRACT
  Filled 2016-06-27: qty 3

## 2016-06-27 MED ORDER — TIOTROPIUM BROMIDE MONOHYDRATE 18 MCG IN CAPS
18.0000 ug | ORAL_CAPSULE | Freq: Every day | RESPIRATORY_TRACT | Status: DC
  Administered 2016-06-28: 18 ug via RESPIRATORY_TRACT
  Filled 2016-06-27: qty 5

## 2016-06-27 MED ORDER — LORAZEPAM 2 MG/ML IJ SOLN: 1.0000 mg | Freq: Four times a day (QID) | INTRAMUSCULAR | Status: DC | PRN

## 2016-06-27 MED ORDER — FOLIC ACID 1 MG PO TABS
1.0000 mg | ORAL_TABLET | Freq: Every day | ORAL | Status: DC
  Administered 2016-06-27 – 2016-06-29 (×3): 1 mg via ORAL
  Filled 2016-06-27 (×3): qty 1

## 2016-06-27 MED ORDER — ADULT MULTIVITAMIN W/MINERALS CH
1.0000 | ORAL_TABLET | Freq: Every day | ORAL | Status: DC
  Administered 2016-06-27 – 2016-06-29 (×3): 1 via ORAL
  Filled 2016-06-27 (×3): qty 1

## 2016-06-27 MED ORDER — SODIUM CHLORIDE 0.9 % IV SOLN: 250.0000 mL | INTRAVENOUS | Status: DC | PRN

## 2016-06-27 MED ORDER — ACETAMINOPHEN 325 MG PO TABS
650.0000 mg | ORAL_TABLET | ORAL | Status: DC | PRN
  Administered 2016-06-28 – 2016-06-29 (×3): 650 mg via ORAL
  Filled 2016-06-27 (×3): qty 2

## 2016-06-27 MED ORDER — THIAMINE HCL 100 MG/ML IJ SOLN: 100.0000 mg | Freq: Every day | INTRAMUSCULAR | Status: DC

## 2016-06-27 MED ORDER — ASPIRIN EC 81 MG PO TBEC
81.0000 mg | DELAYED_RELEASE_TABLET | Freq: Every day | ORAL | Status: DC
  Administered 2016-06-27 – 2016-06-29 (×3): 81 mg via ORAL
  Filled 2016-06-27 (×3): qty 1

## 2016-06-27 MED ORDER — ASPIRIN 81 MG PO CHEW
324.0000 mg | CHEWABLE_TABLET | Freq: Once | ORAL | Status: AC
  Administered 2016-06-27: 324 mg via ORAL
  Filled 2016-06-27: qty 4

## 2016-06-27 MED ORDER — MOMETASONE FURO-FORMOTEROL FUM 100-5 MCG/ACT IN AERO
2.0000 | INHALATION_SPRAY | Freq: Two times a day (BID) | RESPIRATORY_TRACT | Status: DC
  Administered 2016-06-28 – 2016-06-29 (×3): 2 via RESPIRATORY_TRACT
  Filled 2016-06-27: qty 8.8

## 2016-06-27 MED ORDER — VITAMIN B-1 100 MG PO TABS
100.0000 mg | ORAL_TABLET | Freq: Every day | ORAL | Status: DC
  Administered 2016-06-27 – 2016-06-29 (×3): 100 mg via ORAL
  Filled 2016-06-27 (×3): qty 1

## 2016-06-27 MED ORDER — FUROSEMIDE 10 MG/ML IJ SOLN
40.0000 mg | Freq: Once | INTRAMUSCULAR | Status: AC
  Administered 2016-06-27: 40 mg via INTRAVENOUS
  Filled 2016-06-27: qty 4

## 2016-06-27 MED ORDER — SODIUM CHLORIDE 0.9% FLUSH
3.0000 mL | Freq: Two times a day (BID) | INTRAVENOUS | Status: DC
  Administered 2016-06-28 – 2016-06-29 (×4): 3 mL via INTRAVENOUS

## 2016-06-27 MED ORDER — HEPARIN SODIUM (PORCINE) 5000 UNIT/ML IJ SOLN
5000.0000 [IU] | Freq: Three times a day (TID) | INTRAMUSCULAR | Status: DC
  Administered 2016-06-28 – 2016-06-29 (×5): 5000 [IU] via SUBCUTANEOUS
  Filled 2016-06-27 (×5): qty 1

## 2016-06-27 MED ORDER — SODIUM CHLORIDE 0.9% FLUSH: 3.0000 mL | INTRAVENOUS | Status: DC | PRN

## 2016-06-27 MED ORDER — LORAZEPAM 1 MG PO TABS
1.0000 mg | ORAL_TABLET | Freq: Four times a day (QID) | ORAL | Status: DC | PRN
  Administered 2016-06-28: 1 mg via ORAL
  Filled 2016-06-27: qty 1

## 2016-06-27 MED ORDER — LISINOPRIL 2.5 MG PO TABS
2.5000 mg | ORAL_TABLET | Freq: Every day | ORAL | Status: DC
  Administered 2016-06-27 – 2016-06-29 (×3): 2.5 mg via ORAL
  Filled 2016-06-27 (×4): qty 1

## 2016-06-27 MED ORDER — ONDANSETRON HCL 4 MG/2ML IJ SOLN: 4.0000 mg | Freq: Four times a day (QID) | INTRAMUSCULAR | Status: DC | PRN

## 2016-06-27 NOTE — ED Notes (Signed)
Increased [place] to 4L 

## 2016-06-27 NOTE — ED Notes (Signed)
Pt can go to floor at 16:10,

## 2016-06-27 NOTE — ED Triage Notes (Signed)
Pt with bilateral lower leg swelling and SOB. Pt was noted to be 75% on RA with oxygen tank sitting in the wheelchair. Once [place] on pt still with saturation of 88 in triage. Speaking full sentences. HX of CHF and COPD. Reports social issues and that he has been drinking alcohol.

## 2016-06-27 NOTE — H&P (Signed)
History and Physical    Ricardo Burch DOB: January 03, 1961 DOA: 06/27/2016  Referring MD/NP/PA: er PCP: [name], MD Outpatient Specialists:  Patient coming from: homeless 3 weeks  Chief Complaint: SOB/leg swelling  HPI: Ricardo Burch is a 55 y.o. male with medical history significant of  Grade 2 diastolic CHF, COPD, and Sleep apnea.  He lost his residence 3 months ago and has been without his medications and has been living on the street and eating fast food.  He is supposed to wear O2 at home but has been without that as well.  He comes to the ER with complaints of leg swelling and SOB.   Patient also admits to drinking alcohol, smoking marijuana and maybe having some cocaine.  ED Course: BNP was elevated to >1000.  His Cr was elevated and chest xray showed fluid in his lungs.    Review of Systems: all systems reviewed, negative unless stated above in HPI   Past Medical History:  Diagnosis Date  . CHF (congestive heart failure) ([place])   . Complication of anesthesia    never been put to sleep  . COPD (chronic obstructive pulmonary disease) ([place]) Dx 2015  . Eczema    since childhood   . Sleep apnea    CPAP    Past Surgical History:  Procedure Laterality Date  . MULTIPLE EXTRACTIONS WITH ALVEOLOPLASTY N/A 03/22/2016   Procedure: MULTIPLE EXTRACTION WITH ALVEOLOPLASTY;  Surgeon: Diona Browner, DDS;  Location: [facility];  Service: Oral Surgery;  Laterality: N/A;  . NO PAST SURGERIES       reports that he has been smoking Cigarettes.  He has a 15.00 pack-year smoking history. He has never used smokeless tobacco. He reports that he drinks about 0.6 oz of alcohol per week . He reports that he uses drugs, including Marijuana.  No Known Allergies  Family History  Problem Relation Age of Onset  . Cerebral aneurysm Father   . Cancer Neg Hx   . Diabetes Neg Hx   . Heart disease Neg Hx    Unacceptable: Noncontributory, unremarkable, or negative. Acceptable: Family  history reviewed and not pertinent (If you reviewed it)  Prior to Admission medications   Medication Sig Start Date End Date Taking? Authorizing Provider  albuterol (PROVENTIL HFA;VENTOLIN HFA) 108 (90 Base) MCG/ACT inhaler Inhale 2 puffs into the lungs every 6 (six) hours as needed for wheezing or shortness of breath. 05/10/16  Yes Boykin Nearing, MD  aspirin EC 81 MG tablet Take 1 tablet (81 mg total) by mouth daily. 09/20/15  Yes Florencia Reasons, MD  budesonide-formoterol [facility]) 80-4.5 MCG/ACT inhaler Inhale 2 puffs into the lungs 2 (two) times daily. 05/21/16  Yes Brand Males, MD  furosemide (LASIX) 20 MG tablet Take 1 tablet (20 mg total) by mouth daily. OV needed for additional refills 06/21/16  Yes Josalyn Funches, MD  guaiFENesin (MUCINEX) 600 MG 12 hr tablet Take 2 tablets (1,200 mg total) by mouth 2 (two) times daily. Patient taking differently: Take 600 mg by mouth every 12 (twelve) hours.  09/20/15  Yes Florencia Reasons, MD  tiotropium (SPIRIVA) 18 MCG inhalation capsule Place 1 capsule (18 mcg total) into inhaler and inhale daily. 05/10/16  Yes Josalyn Funches, MD  budesonide-formoterol (SYMBICORT) 80-4.5 MCG/ACT inhaler Inhale 2 puffs into the lungs 2 (two) times daily. Patient not taking: Reported on 06/27/2016 05/10/16 05/20/17  Boykin Nearing, MD  SYMBICORT 80-4.5 MCG/ACT inhaler INHALE 2 PUFFS INTO THE LUNGS 2 TIMES DAILY Patient not taking: Reported on  06/27/2016 09/12/15   Boykin Nearing, MD  varenicline (CHANTIX CONTINUING MONTH PAK) 1 MG tablet Take 1 tablet (1 mg total) by mouth 2 (two) times daily. Patient not taking: Reported on 06/27/2016 01/16/16   Marshell Garfinkel, MD  varenicline (CHANTIX STARTING MONTH PAK) 0.5 MG X 11 & 1 MG X 42 tablet Take one 0.5 mg tablet by mouth once daily for 3 days, then increase to one 0.5 mg tablet twice daily for 4 days, then increase to one 1 mg tablet twice daily. Patient not taking: Reported on 05/20/2016 01/16/16   Marshell Garfinkel, MD    Physical  Exam: Vitals:   06/27/16 1229 06/27/16 1315 06/27/16 1451  BP: 128/81 116/85 114/80  Pulse: 92 90 86  Resp: 22 13 15   Temp: 99.1 F (37.3 C)    TempSrc: Oral    SpO2: (!) 84% 92% 91%      Constitutional: NAD, calm, comfortable-- smells of alcohol Vitals:   06/27/16 1229 06/27/16 1315 06/27/16 1451  BP: 128/81 116/85 114/80  Pulse: 92 90 86  Resp: 22 13 15   Temp: 99.1 F (37.3 C)    TempSrc: Oral    SpO2: (!) 84% 92% 91%   Eyes: PERRL, lids and conjunctivae normal ENMT: Mucous membranes are moist. Posterior pharynx clear of any exudate or lesions.Normal dentition.  Neck: +JVD Respiratory: no wheezing, diminished at bases Cardiovascular: Regular rate and rhythm, no murmurs / rubs / gallops. + extremity edema.  No carotid bruits.  Abdomen: no tenderness, no masses palpated. No hepatosplenomegaly. Bowel sounds positive.  Musculoskeletal: no clubbing / cyanosis. No joint deformity upper and lower extremities. Good ROM, no contractures. Normal muscle tone.  Skin: no rashes, lesions, ulcers. No induration Neurologic: CN 2-12 grossly intact. Sensation intact, DTR normal. Strength 5/5 in all 4.  Psychiatric: Normal judgment and insight. Alert and oriented x 3. Normal mood.    Labs on Admission: I have personally reviewed following labs and imaging studies  CBC:  Recent Labs Lab 06/27/16 1302  WBC 11.7*  HGB 16.1  HCT 49.1  MCV 102.9*  PLT 123456   Basic Metabolic Panel:  Recent Labs Lab 06/27/16 1302  NA 135  K 3.5  CL 94*  CO2 34*  GLUCOSE 139*  BUN 28*  CREATININE 1.54*  CALCIUM 7.9*   GFR: CrCl cannot be calculated (Unknown ideal weight.). Liver Function Tests:  Recent Labs Lab 06/27/16 1302  AST 84*  ALT 60  ALKPHOS 90  BILITOT 1.1  PROT 6.9  ALBUMIN 3.5   No results for input(s): LIPASE, AMYLASE in the last 168 hours. No results for input(s): AMMONIA in the last 168 hours. Coagulation Profile: No results for input(s): INR, PROTIME in the last  168 hours. Cardiac Enzymes: No results for input(s): CKTOTAL, CKMB, CKMBINDEX, TROPONINI in the last 168 hours. BNP (last 3 results) No results for input(s): PROBNP in the last 8760 hours. HbA1C: No results for input(s): HGBA1C in the last 72 hours. CBG: No results for input(s): GLUCAP in the last 168 hours. Lipid Profile: No results for input(s): CHOL, HDL, LDLCALC, TRIG, CHOLHDL, LDLDIRECT in the last 72 hours. Thyroid Function Tests: No results for input(s): TSH, T4TOTAL, FREET4, T3FREE, THYROIDAB in the last 72 hours. Anemia Panel: No results for input(s): VITAMINB12, FOLATE, FERRITIN, TIBC, IRON, RETICCTPCT in the last 72 hours. Urine analysis:    Component Value Date/Time   COLORURINE YELLOW 09/16/2015 1633   APPEARANCEUR CLEAR 09/16/2015 1633   LABSPEC 1.010 09/16/2015 1633   PHURINE 5.5  09/16/2015 1633   GLUCOSEU NEGATIVE 09/16/2015 1633   HGBUR Anes (A) 09/16/2015 1633   BILIRUBINUR NEGATIVE 09/16/2015 1633   KETONESUR 15 (A) 09/16/2015 1633   PROTEINUR NEGATIVE 09/16/2015 1633   UROBILINOGEN 1.0 09/16/2015 1633   NITRITE NEGATIVE 09/16/2015 1633   LEUKOCYTESUR NEGATIVE 09/16/2015 1633   Sepsis Labs: Invalid input(s): PROCALCITONIN, LACTICIDVEN No results found for this or any previous visit (from the past 240 hour(s)).   Radiological Exams on Admission: Dg Chest 2 View  Result Date: 06/27/2016 CLINICAL DATA:  Shortness of breath for 2-3 days. EXAM: CHEST  2 VIEW COMPARISON:  Cough and congestion.  History of COPD. FINDINGS: The cardiac silhouette is enlarged. There is widening of the mediastinal contours. There is no evidence of focal airspace consolidation, pleural effusion or pneumothorax. Mildly increased interstitial markings. Osseous structures are without acute abnormality. Soft tissues are grossly normal. Chronic deformity of mid left lateral ribs. IMPRESSION: Abnormal widening of the mediastinal contours which may represent abnormal vascular shadows such as  enlargement of the main pulmonary trunk, however aortic pathology cannot be excluded. If patient presents with suggestive symptomatology, CT angiogram of the chest should be considered. Mild increased interstitial markings which may be seen with pulmonary vascular congestion. Electronically Signed   By: Fidela Salisbury M.D.   On: 06/27/2016 13:23    EKG: Independently reviewed. Sinus with RBBB  Assessment/Plan Active Problems:   Tobacco abuse   ARF (acute renal failure) (HCC)   CHF (congestive heart failure) (HCC)   Alcohol abuse   Acute diastolic (congestive) heart failure (HCC)   Acute diastolic CHF -IV Lasix -daily labs -repeat echo-- grade 2 diastolic -out of meds -eating fast foods  AKI -baseline 1.2-1.3  Alcohol abuse -CIWA  Tobacco abuse -encourage cessation  COPD -appears not be an exacerbation  Acute on chronic resp failure -4L O2 currently  Patient says he has been homeless and out of his medications-social work as well as care management consult  DVT prophylaxis: heparin Code Status: full Family Communication: patient Disposition Plan: ?home less-- social work consult Consults called:  Admission status: inpt   [place] DO [facility] Pager [phone]  If 7PM-7AM, please contact night-coverage [email or web address] Password [facility]  06/27/2016, 3:23 PM

## 2016-06-27 NOTE — ED Provider Notes (Signed)
[facility] DEPT Provider Note   CSN: [id number] Arrival date & time: 06/27/16  1224     History   Chief Complaint Chief Complaint  Patient presents with  . Shortness of Breath  . Leg Swelling    HPI Ricardo Burch is a 55 y.o. male.  HPI 55 year old male with past medical history of CHF and COPD who presents with a 3 week history of progressively worsening shortness of breath. Patient states that over the last 3 weeks she has been under significant stress because he found out about the house that he has been living in was sold. He has been moving his stuff to a friend's house. He states that over this time he has not taken or refilled any of his medications. He also ran out of all of his inhalers. He endorses progressive worsening cough, shortness of breath orthopnea and dyspnea on exertion. He states he is also not been using his oxygen as it has been prescribed. He has also been eating fast food fairly frequently as he cannot cook at his house. Denies any fevers. He feels generally unwell. Denies any alleviating factors. All of his symptoms are worse with any amount of exertion.  Past Medical History:  Diagnosis Date  . CHF (congestive heart failure) ([place])   . Complication of anesthesia    never been put to sleep  . COPD (chronic obstructive pulmonary disease) ([place]) Dx 2015  . Eczema    since childhood   . Sleep apnea    CPAP    Patient Active Problem List   Diagnosis Date Noted  . CHF (congestive heart failure) ([place]) 06/27/2016  . Alcohol abuse 06/27/2016  . Acute diastolic (congestive) heart failure ([place]) 06/27/2016  . ARF (acute renal failure) ([place])   . Acute respiratory failure ([place]) 09/16/2015  . Elevated BP 08/24/2015  . COPD exacerbation ([place]) 08/24/2015  . Left elbow contusion 05/26/2015  . Healthcare maintenance 05/26/2015  . COPD (chronic obstructive pulmonary disease) ([place]) 08/23/2014  . Diastolic CHF ([place]) [id number]  . Eczema 08/23/2014  . Right knee pain  08/23/2014  . Tobacco abuse 03/27/2014    Past Surgical History:  Procedure Laterality Date  . MULTIPLE EXTRACTIONS WITH ALVEOLOPLASTY N/A 03/22/2016   Procedure: MULTIPLE EXTRACTION WITH ALVEOLOPLASTY;  Surgeon: Diona Browner, DDS;  Location: [facility];  Service: Oral Surgery;  Laterality: N/A;  . NO PAST SURGERIES         Home Medications    Prior to Admission medications   Medication Sig Start Date End Date Taking? Authorizing Provider  albuterol (PROVENTIL HFA;VENTOLIN HFA) 108 (90 Base) MCG/ACT inhaler Inhale 2 puffs into the lungs every 6 (six) hours as needed for wheezing or shortness of breath. 05/10/16  Yes Boykin Nearing, MD  aspirin EC 81 MG tablet Take 1 tablet (81 mg total) by mouth daily. 09/20/15  Yes Florencia Reasons, MD  budesonide-formoterol [facility]) 80-4.5 MCG/ACT inhaler Inhale 2 puffs into the lungs 2 (two) times daily. 05/21/16  Yes Brand Males, MD  furosemide (LASIX) 20 MG tablet Take 1 tablet (20 mg total) by mouth daily. OV needed for additional refills 06/21/16  Yes Josalyn Funches, MD  guaiFENesin (MUCINEX) 600 MG 12 hr tablet Take 2 tablets (1,200 mg total) by mouth 2 (two) times daily. Patient taking differently: Take 600 mg by mouth every 12 (twelve) hours.  09/20/15  Yes Florencia Reasons, MD  tiotropium (SPIRIVA) 18 MCG inhalation capsule Place 1 capsule (18 mcg total) into inhaler and inhale daily. 05/10/16  Yes Josalyn  Funches, MD  budesonide-formoterol (SYMBICORT) 80-4.5 MCG/ACT inhaler Inhale 2 puffs into the lungs 2 (two) times daily. Patient not taking: Reported on 06/27/2016 05/10/16 05/20/17  Boykin Nearing, MD  SYMBICORT 80-4.5 MCG/ACT inhaler INHALE 2 PUFFS INTO THE LUNGS 2 TIMES DAILY Patient not taking: Reported on 06/27/2016 09/12/15   Boykin Nearing, MD  varenicline (CHANTIX CONTINUING MONTH PAK) 1 MG tablet Take 1 tablet (1 mg total) by mouth 2 (two) times daily. Patient not taking: Reported on 06/27/2016 01/16/16   Marshell Garfinkel, MD  varenicline (CHANTIX STARTING  MONTH PAK) 0.5 MG X 11 & 1 MG X 42 tablet Take one 0.5 mg tablet by mouth once daily for 3 days, then increase to one 0.5 mg tablet twice daily for 4 days, then increase to one 1 mg tablet twice daily. Patient not taking: Reported on 05/20/2016 01/16/16   Marshell Garfinkel, MD    Family History Family History  Problem Relation Age of Onset  . Cerebral aneurysm Father   . Cancer Neg Hx   . Diabetes Neg Hx   . Heart disease Neg Hx     Social History Social History  Substance Use Topics  . Smoking status: Current Every Day Smoker    Packs/day: 0.50    Years: 30.00    Types: Cigarettes  . Smokeless tobacco: Never Used  . Alcohol use 0.6 oz/week    1 Cans of beer per week     Comment: occassionally     Allergies   Review of patient's allergies indicates no known allergies.   Review of Systems Review of Systems  Constitutional: Positive for fatigue. Negative for chills and fever.  HENT: Negative for congestion and rhinorrhea.   Eyes: Negative for visual disturbance.  Respiratory: Positive for cough and shortness of breath. Negative for wheezing.   Cardiovascular: Positive for leg swelling. Negative for chest pain.  Gastrointestinal: Negative for abdominal pain, diarrhea, nausea and vomiting.  Genitourinary: Negative for dysuria and flank pain.  Musculoskeletal: Negative for neck pain and neck stiffness.  Skin: Negative for rash and wound.  Allergic/Immunologic: Negative for immunocompromised state.  Neurological: Negative for syncope, weakness and headaches.  All other systems reviewed and are negative.    Physical Exam Updated Vital Signs BP (!) 139/95 (BP Location: Left Arm)   Pulse 88   Temp 98.6 F (37 C) (Oral)   Resp 20   Ht 5\' 7"  (1.702 m)   Wt 240 lb 4.8 oz (109 kg)   SpO2 94%   BMI 37.64 kg/m   Physical Exam  Constitutional: He is oriented to person, place, and time. He appears well-developed and well-nourished. No distress.  HENT:  Head: Normocephalic  and atraumatic.  Eyes: Conjunctivae are normal.  Neck: Neck supple.  Cardiovascular: Normal rate, regular rhythm and normal heart sounds.  Exam reveals no friction rub.   No murmur heard. Pulmonary/Chest: Effort normal. No respiratory distress. He has no wheezes. He has rales in the right lower field and the left lower field.  Abdominal: He exhibits no distension.  Musculoskeletal: He exhibits edema (2+ pitting).  Neurological: He is alert and oriented to person, place, and time. He exhibits normal muscle tone.  Skin: Skin is warm. Capillary refill takes less than 2 seconds.  Psychiatric: He has a normal mood and affect.  Nursing note and vitals reviewed.    ED Treatments / Results  Labs (all labs ordered are listed, but only abnormal results are displayed) Labs Reviewed  CBC - Abnormal; Notable for the  following:       Result Value   WBC 11.7 (*)    MCV 102.9 (*)    RDW 15.9 (*)    All other components within normal limits  COMPREHENSIVE METABOLIC PANEL - Abnormal; Notable for the following:    Chloride 94 (*)    CO2 34 (*)    Glucose, Bld 139 (*)    BUN 28 (*)    Creatinine, Ser 1.54 (*)    Calcium 7.9 (*)    AST 84 (*)    GFR calc non Af Amer 50 (*)    GFR calc Af Amer 58 (*)    All other components within normal limits  BRAIN NATRIURETIC PEPTIDE - Abnormal; Notable for the following:    B Natriuretic Peptide 1,435.2 (*)    All other components within normal limits  URINE RAPID DRUG SCREEN, HOSP PERFORMED - Abnormal; Notable for the following:    Cocaine POSITIVE (*)    Benzodiazepines POSITIVE (*)    Tetrahydrocannabinol POSITIVE (*)    All other components within normal limits  BASIC METABOLIC PANEL  CBC  I-STAT TROPOININ, ED    EKG  EKG Interpretation  Date/Time:  Thursday June 27 2016 12:38:28 EDT Ventricular Rate:  91 PR Interval:    QRS Duration: 175 QT Interval:  430 QTC Calculation: 530 R Axis:   -176 Text Interpretation:  Sinus rhythm Biatrial  enlargement Right bundle branch block Since last tracing Although rate has increased QRS has widened Confirmed by Angelica Wix MD, Lysbeth Galas [phone]) on 06/27/2016 1:34:15 PM       Radiology Dg Chest 2 View  Result Date: 06/27/2016 CLINICAL DATA:  Shortness of breath for 2-3 days. EXAM: CHEST  2 VIEW COMPARISON:  Cough and congestion.  History of COPD. FINDINGS: The cardiac silhouette is enlarged. There is widening of the mediastinal contours. There is no evidence of focal airspace consolidation, pleural effusion or pneumothorax. Mildly increased interstitial markings. Osseous structures are without acute abnormality. Soft tissues are grossly normal. Chronic deformity of mid left lateral ribs. IMPRESSION: Abnormal widening of the mediastinal contours which may represent abnormal vascular shadows such as enlargement of the main pulmonary trunk, however aortic pathology cannot be excluded. If patient presents with suggestive symptomatology, CT angiogram of the chest should be considered. Mild increased interstitial markings which may be seen with pulmonary vascular congestion. Electronically Signed   By: Fidela Salisbury M.D.   On: 06/27/2016 13:23    Procedures Procedures (including critical care time)  Medications Ordered in ED Medications  mometasone-formoterol (DULERA) 100-5 MCG/ACT inhaler 2 puff (not administered)  tiotropium (SPIRIVA) inhalation capsule 18 mcg (not administered)  aspirin EC tablet 81 mg (81 mg Oral Given 06/27/16 1743)  sodium chloride flush (NS) 0.9 % injection 3 mL (not administered)  sodium chloride flush (NS) 0.9 % injection 3 mL (not administered)  0.9 %  sodium chloride infusion (not administered)  acetaminophen (TYLENOL) tablet 650 mg (not administered)  ondansetron (ZOFRAN) injection 4 mg (not administered)  heparin injection 5,000 Units (not administered)  lisinopril (PRINIVIL,ZESTRIL) tablet 2.5 mg (2.5 mg Oral Given 06/27/16 1743)  furosemide (LASIX) injection 40 mg  (40 mg Intravenous Given 06/27/16 1743)  LORazepam (ATIVAN) tablet 1 mg (not administered)    Or  LORazepam (ATIVAN) injection 1 mg (not administered)  thiamine (VITAMIN B-1) tablet 100 mg (100 mg Oral Given 06/27/16 1743)    Or  thiamine (B-1) injection 100 mg ( Intravenous See Alternative [id number])  folic acid (FOLVITE) tablet 1  mg (1 mg Oral Given 06/27/16 1743)  multivitamin with minerals tablet 1 tablet (1 tablet Oral Given 06/27/16 1743)  ipratropium-albuterol (DUONEB) 0.5-2.5 (3) MG/3ML nebulizer solution 3 mL (3 mLs Nebulization Given 06/27/16 1314)  aspirin chewable tablet 324 mg (324 mg Oral Given 06/27/16 1314)  furosemide (LASIX) injection 40 mg (40 mg Intravenous Given 06/27/16 1511)     Initial Impression / Assessment and Plan / ED Course  I have reviewed the triage vital signs and the nursing notes.  Pertinent labs & imaging results that were available during my care of the patient were reviewed by me and considered in my medical decision making (see chart for details).  Clinical Course  Value Comment By Time  DG Chest 2 View (Reviewed) Duffy Bruce, MD 08/51 979  55 year old male with past medical history of COPD and CHF who presents with acute on chronic shortness of breath and worsening lower extremity edema. Examination is as above. Patient has lower extremity edema and bibasilar rales consistent with likely CHF exacerbation. Chest x-ray obtained and shows interstitial edema consistent with CHF exacerbation. Of note, he has an abnormal aortic contour, but this is unchanged from previous on my review of records. EKG is nonischemic. Labwork shows a BNP of 1435. CMP shows mild acute on chronic kidney injury. Urine drug screen is positive for cocaine, benzos and THC. Suspect patient's symptoms are due to acute on chronic CHF in the setting of medication nonadherence and cocaine abuse. Will admit to hospitalists for IV diuresis.  Final diagnoses:  Acute on chronic systolic  congestive heart failure (HCC)  Elevated brain natriuretic peptide (BNP) level  Polysubstance abuse    New Prescriptions Current Discharge Medication List       Duffy Bruce, MD 06/27/16 2012

## 2016-06-27 NOTE — Progress Notes (Signed)
ED CM noted CM consult for Pt has trouble affording medications. Please provide available resources.  ED CM spoke with Pt who states his issues has not been that he could afford his medications Tie problem was that he had not prioritized the use of his disability check and did not get his meds for "a month" plus is presently "dealing with needing to leave the house I have lived in for thirteen years"  Pt began to tell Cm about his mother willing the home he lives in to his brother, the mother died, then the brother died, the home willed to brother's son when brother died and now the sister in law is selling the home for the money Pt states he had been paying the taxes, bills, electricity, etc and now will be homeless soon. States he ask to speak with someone in housing but referred to complete an application which he has not done.  Plus reports he is with out friends, his child is no assist and his child's mother is in [place].  CM encourage pt to go back to housing, fill out application and go to [place] office to see if his assigned case worker can assist with housing since he is on a fixed income.   CM provided pt choice connections information to speak with community SW Seth Bake Cm provided pt with a list of [place] crisis program to include legal aid of , urban ministries, financial resources, programs to assist with household bills, etc  Pt appreciative of care, allowing him time to ventilate his feelings and resources to assist with his social issues Pt state he will better prioritize his SSI check to get his medications as needed and provide [facility] with the forms requested to help them to help him get discounted medication Pt did confirm [place] is his pcp

## 2016-06-28 ENCOUNTER — Inpatient Hospital Stay (HOSPITAL_COMMUNITY): Payer: Medicaid Other

## 2016-06-28 DIAGNOSIS — I509 Heart failure, unspecified: Secondary | ICD-10-CM

## 2016-06-28 DIAGNOSIS — M7989 Other specified soft tissue disorders: Secondary | ICD-10-CM

## 2016-06-28 LAB — BASIC METABOLIC PANEL
ANION GAP: 7 (ref 5–15)
BUN: 21 mg/dL — ABNORMAL HIGH (ref 6–20)
CALCIUM: 8.1 mg/dL — AB (ref 8.9–10.3)
CO2: 37 mmol/L — AB (ref 22–32)
Chloride: 93 mmol/L — ABNORMAL LOW (ref 101–111)
Creatinine, Ser: 1.33 mg/dL — ABNORMAL HIGH (ref 0.61–1.24)
GFR calc non Af Amer: 60 mL/min — ABNORMAL LOW (ref >60)
GLUCOSE: 114 mg/dL — AB (ref 65–99)
POTASSIUM: 3.6 mmol/L (ref 3.5–5.1)
Sodium: 137 mmol/L (ref 135–145)

## 2016-06-28 LAB — CBC
HEMATOCRIT: 51.7 % (ref 39.0–52.0)
HEMOGLOBIN: 16.3 g/dL (ref 13.0–17.0)
MCH: 33.3 pg (ref 26.0–34.0)
MCHC: 31.5 g/dL (ref 30.0–36.0)
MCV: 105.7 fL — ABNORMAL HIGH (ref 78.0–100.0)
Platelets: 243 10*3/uL (ref 150–400)
RBC: 4.89 MIL/uL (ref 4.22–5.81)
RDW: 15.8 % — ABNORMAL HIGH (ref 11.5–15.5)
WBC: 12.3 10*3/uL — ABNORMAL HIGH (ref 4.0–10.5)

## 2016-06-28 LAB — ECHOCARDIOGRAM COMPLETE
HEIGHTINCHES: 67 in
Weight: 3848.35 oz

## 2016-06-28 MED ORDER — IPRATROPIUM-ALBUTEROL 0.5-2.5 (3) MG/3ML IN SOLN: 3.0000 mL | Freq: Four times a day (QID) | RESPIRATORY_TRACT | Status: DC

## 2016-06-28 MED ORDER — ENSURE ENLIVE PO LIQD: 237.0000 mL | Freq: Two times a day (BID) | ORAL | Status: DC | PRN

## 2016-06-28 MED ORDER — IPRATROPIUM-ALBUTEROL 0.5-2.5 (3) MG/3ML IN SOLN: 3.0000 mL | Freq: Four times a day (QID) | RESPIRATORY_TRACT | Status: DC | PRN

## 2016-06-28 NOTE — Progress Notes (Signed)
VASCULAR LAB PRELIMINARY  PRELIMINARY  PRELIMINARY  PRELIMINARY  Left lower extremity venous duplex completed.    Preliminary report:  Left:  No evidence of DVTor superficial thrombosis.  Baker's cyst.noted in popliteal fossa  Ricardo Burch, RVS 06/28/2016, 4:15 PM

## 2016-06-28 NOTE — Progress Notes (Signed)
CSW received consult for homelessness issues. CSW provided patient with information on [employer] & encouraged patient to call and setup an appointment to complete application. CSW also provided patient with information on [employer] & area shelters. Patient states that if he is unable to go to his home at discharge, that he has someone that he could stay with.   No further CSW needs identified - CSW signing off.   Ricardo Burch, [facility] Clinical Social Worker cell #: [phone]

## 2016-06-28 NOTE — Progress Notes (Signed)
[facility] PROGRESS NOTE  Ricardo Burch [id number] DOB: 31-May-1961 DOA: 06/27/2016 PCP: [name], MD  Active Problems:   Tobacco abuse   ARF (acute renal failure) (HCC)   CHF (congestive heart failure) (HCC)   Alcohol abuse   Acute diastolic (congestive) heart failure (HCC)  Brief summary  55 y.o. male with medical history significant of Grade 2 diastolic CHF, COPD, and Sleep apnea.  He lost his residence 3 months ago and has been without his medications and has been living on the street and eating fast food.  He is supposed to wear O2 at home but has been without that as well.  He comes to the ER with complaints of leg swelling and SOB.   Patient also admits to drinking alcohol, smoking marijuana and having some cocaine.  Assessment/Plan:  Acute diastolic CHF.  Probable HF due to substance abuse, cocaine use. Pend echo, r/o systolic HF -Still crackles on exam. No acute chest pains. cont IV Lasix. On lisinopril, may need BB 24-48 hrs(holding with recent cocaine use). Monitor I/o, weight  -also obtain doppler leg ultrasound, left leg edema>right r/o dvt  AKI. baseline 1.2-1.3. Monitor on diuresis   Alcohol abuse. Monitor on CIWA Substance abuse. Counseled to stop using drugs   Tobacco abuse encourage cessation  COPD. No exacerbation. Cont bronchodilators   Acute on chronic resp failure. Cont lasix, inhalers, on 4L O2 currently  Code Status: full Family Communication:  D/w patient, Therapist, sports (indicate person spoken with, relationship, and if by phone, the number) Disposition Plan: homeless, needs SW eval    Consultants:  none  Procedures:  Pend echo   Antibiotics:  none (indicate start date, and stop date if known)  HPI/Subjective: Alert, no distress, but has DOE  Objective: Vitals:   06/28/16 0922 06/28/16 1349  BP: (!) 145/88 (!) 144/87  Pulse:  90  Resp:  18  Temp:  98.6 F (37 C)    Intake/Output Summary (Last 24 hours) at 06/28/16  1407 Last data filed at 06/28/16 1114  Gross per 24 hour  Intake              240 ml  Output             2275 ml  Net            -2035 ml   Filed Weights   06/27/16 1640 06/28/16 0526  Weight: 109 kg (240 lb 4.8 oz) 109.1 kg (240 lb 8.4 oz)    Exam:   General:  Comfortable   Cardiovascular: s1,s2 rrr  Respiratory: LL crackles   Abdomen: soft, nt   Musculoskeletal: leg edema +   Data Reviewed: Basic Metabolic Panel:  Recent Labs Lab 06/27/16 1302 06/28/16 0517  NA 135 137  K 3.5 3.6  CL 94* 93*  CO2 34* 37*  GLUCOSE 139* 114*  BUN 28* 21*  CREATININE 1.54* 1.33*  CALCIUM 7.9* 8.1*   Liver Function Tests:  Recent Labs Lab 06/27/16 1302  AST 84*  ALT 60  ALKPHOS 90  BILITOT 1.1  PROT 6.9  ALBUMIN 3.5   No results for input(s): LIPASE, AMYLASE in the last 168 hours. No results for input(s): AMMONIA in the last 168 hours. CBC:  Recent Labs Lab 06/27/16 1302 06/28/16 0517  WBC 11.7* 12.3*  HGB 16.1 16.3  HCT 49.1 51.7  MCV 102.9* 105.7*  PLT 211 243   Cardiac Enzymes: No results for input(s): CKTOTAL, CKMB, CKMBINDEX, TROPONINI in the last 168 hours. BNP (  last 3 results)  Recent Labs  09/16/15 1140 09/18/15 0500 06/27/16 1302  BNP 1,091.5* 359.1* 1,435.2*    ProBNP (last 3 results) No results for input(s): PROBNP in the last 8760 hours.  CBG: No results for input(s): GLUCAP in the last 168 hours.  No results found for this or any previous visit (from the past 240 hour(s)).   Studies: Dg Chest 2 View  Result Date: 06/27/2016 CLINICAL DATA:  Shortness of breath for 2-3 days. EXAM: CHEST  2 VIEW COMPARISON:  Cough and congestion.  History of COPD. FINDINGS: The cardiac silhouette is enlarged. There is widening of the mediastinal contours. There is no evidence of focal airspace consolidation, pleural effusion or pneumothorax. Mildly increased interstitial markings. Osseous structures are without acute abnormality. Soft tissues are  grossly normal. Chronic deformity of mid left lateral ribs. IMPRESSION: Abnormal widening of the mediastinal contours which may represent abnormal vascular shadows such as enlargement of the main pulmonary trunk, however aortic pathology cannot be excluded. If patient presents with suggestive symptomatology, CT angiogram of the chest should be considered. Mild increased interstitial markings which may be seen with pulmonary vascular congestion. Electronically Signed   By: Fidela Salisbury M.D.   On: 06/27/2016 13:23    Scheduled Meds: . aspirin EC  81 mg Oral Daily  . folic acid  1 mg Oral Daily  . furosemide  40 mg Intravenous BID  . heparin  5,000 Units Subcutaneous Q8H  . ipratropium-albuterol  3 mL Nebulization Q6H  . lisinopril  2.5 mg Oral Daily  . mometasone-formoterol  2 puff Inhalation BID  . multivitamin with minerals  1 tablet Oral Daily  . sodium chloride flush  3 mL Intravenous Q12H  . thiamine  100 mg Oral Daily   Continuous Infusions:   Active Problems:   Tobacco abuse   ARF (acute renal failure) (HCC)   CHF (congestive heart failure) (HCC)   Alcohol abuse   Acute diastolic (congestive) heart failure ([place])    Time spent: >35 minutes     Kinnie Feil  [facility] Pager [phone]. If 7PM-7AM, please contact night-coverage at [email or web address], password [facility] 06/28/2016, 2:07 PM  LOS: 1 day

## 2016-06-28 NOTE — Progress Notes (Signed)
  Echocardiogram 2D Echocardiogram has been performed.  Tresa Res 06/28/2016, 12:55 PM

## 2016-06-28 NOTE — Progress Notes (Signed)
Initial Nutrition Assessment  DOCUMENTATION CODES:   Obesity unspecified  INTERVENTION:   Provide Ensure Enlive po BID PRN, each supplement provides 350 kcal and 20 grams of protein RD to continue to monitor   NUTRITION DIAGNOSIS:   Inadequate oral intake related to social / environmental circumstances (homelessness x 3 weeks) as evidenced by per patient/family report (consuming 1-2 meals a day).  GOAL:   Patient will meet greater than or equal to 90% of their needs  MONITOR:   PO intake, Supplement acceptance, Labs, Weight trends, I & O's  REASON FOR ASSESSMENT:   Malnutrition Screening Tool    ASSESSMENT:   55 y.o. male with medical history significant of  Grade 2 diastolic CHF, COPD, and Sleep apnea.  He lost his residence 3 months ago and has been without his medications and has been living on the street and eating fast food.  He is supposed to wear O2 at home but has been without that as well.  He comes to the ER with complaints of leg swelling and SOB  Patient reports eating breakfast this morning with 100% completion of an omelette. Pt reports homelessness for 3 weeks PTA and having to rely on fast food for his meals ([employer], [place]). Pt states he would eat once, maybe 2 times daily. Pt reports some ETOH use and using cocaine and marijuana sparingly. He does have a good appetite and plans to order lunch soon. Pt reports cooking when he is able. Pt denies any chewing issues despite having teeth pulled ~1 month ago. Pt reports losing 30 lb over the past 3 months. Per weight history documentation, pt has lost this amount since October 2016 which is insignificant for time frame. Unsure of patient's dry weight as he has history of CHF and has been ordered Lasix. Will monitor weight trends.  Nutrition focused physical exam shows no sign of depletion of muscle mass or body fat.  Labs reviewed. Medications: Folic acid tablet daily, IV Lasix BID, Multivitamin with minerals  daily, Thiamine tablet daily  Diet Order:  Diet Heart Room service appropriate? Yes; Fluid consistency: Thin  Skin:  Reviewed, no issues  Last BM:  8/23  Height:   Ht Readings from Last 1 Encounters:  06/27/16 5\' 7"  (1.702 m)    Weight:   Wt Readings from Last 1 Encounters:  06/28/16 240 lb 8.4 oz (109.1 kg)    Ideal Body Weight:  67.3 kg  BMI:  Body mass index is 37.67 kg/m.  Estimated Nutritional Needs:   Kcal:  2200-2400  Protein:  100-110g  Fluid:  2.2L/day  EDUCATION NEEDS:   Education needs addressed  Clayton Bibles, MS, RD, LDN Pager: [phone] After Hours Pager: [phone]

## 2016-06-29 DIAGNOSIS — Z720 Tobacco use: Secondary | ICD-10-CM

## 2016-06-29 DIAGNOSIS — F1010 Alcohol abuse, uncomplicated: Secondary | ICD-10-CM

## 2016-06-29 DIAGNOSIS — I5031 Acute diastolic (congestive) heart failure: Secondary | ICD-10-CM

## 2016-06-29 LAB — BASIC METABOLIC PANEL
ANION GAP: 7 (ref 5–15)
BUN: 13 mg/dL (ref 6–20)
CHLORIDE: 87 mmol/L — AB (ref 101–111)
CO2: 41 mmol/L — AB (ref 22–32)
Calcium: 8 mg/dL — ABNORMAL LOW (ref 8.9–10.3)
Creatinine, Ser: 0.97 mg/dL (ref 0.61–1.24)
GFR calc Af Amer: >60 mL/min (ref >60)
GLUCOSE: 128 mg/dL — AB (ref 65–99)
POTASSIUM: 3.2 mmol/L — AB (ref 3.5–5.1)
Sodium: 135 mmol/L (ref 135–145)

## 2016-06-29 MED ORDER — ALBUTEROL SULFATE HFA 108 (90 BASE) MCG/ACT IN AERS: 2.0000 | INHALATION_SPRAY | Freq: Four times a day (QID) | RESPIRATORY_TRACT | 1 refills | Status: DC | PRN

## 2016-06-29 MED ORDER — TIOTROPIUM BROMIDE MONOHYDRATE 18 MCG IN CAPS
18.0000 ug | ORAL_CAPSULE | Freq: Every day | RESPIRATORY_TRACT | Status: DC
  Administered 2016-06-29: 18 ug via RESPIRATORY_TRACT

## 2016-06-29 MED ORDER — POTASSIUM CHLORIDE CRYS ER 20 MEQ PO TBCR
40.0000 meq | EXTENDED_RELEASE_TABLET | Freq: Once | ORAL | Status: AC
  Administered 2016-06-29: 40 meq via ORAL
  Filled 2016-06-29: qty 2

## 2016-06-29 MED ORDER — FUROSEMIDE 20 MG PO TABS: 20.0000 mg | ORAL_TABLET | Freq: Every day | ORAL | 1 refills | Status: DC

## 2016-06-29 MED ORDER — POTASSIUM CHLORIDE ER 10 MEQ PO TBCR: 10.0000 meq | EXTENDED_RELEASE_TABLET | Freq: Every day | ORAL | 0 refills | Status: DC

## 2016-06-29 MED ORDER — HYDRALAZINE HCL 10 MG PO TABS: 10.0000 mg | ORAL_TABLET | Freq: Three times a day (TID) | ORAL | 0 refills | Status: DC

## 2016-06-29 NOTE — Discharge Instructions (Signed)
Furosemide tablets What is this medicine? FUROSEMIDE (fyoor [place] se mide) is a diuretic. It helps you make more urine and to lose salt and excess water from your body. This medicine is used to treat high blood pressure, and edema or swelling from heart, kidney, or liver disease. This medicine may be used for other purposes; ask your health care provider or pharmacist if you have questions. What should I tell my health care provider before I take this medicine? They need to know if you have any of these conditions: -abnormal blood electrolytes -diarrhea or vomiting -gout -heart disease -kidney disease, Demilio amounts of urine, or difficulty passing urine -liver disease -thyroid disease -an unusual or allergic reaction to furosemide, sulfa drugs, other medicines, foods, dyes, or preservatives -pregnant or trying to get pregnant -breast-feeding How should I use this medicine? Take this medicine by mouth with a glass of water. Follow the directions on the prescription label. You may take this medicine with or without food. If it upsets your stomach, take it with food or milk. Do not take your medicine more often than directed. Remember that you will need to pass more urine after taking this medicine. Do not take your medicine at a time of day that will cause you problems. Do not take at bedtime. Talk to your pediatrician regarding the use of this medicine in children. While this drug may be prescribed for selected conditions, precautions do apply. Overdosage: If you think you have taken too much of this medicine contact a poison control center or emergency room at once. NOTE: This medicine is only for you. Do not share this medicine with others. What if I miss a dose? If you miss a dose, take it as soon as you can. If it is almost time for your next dose, take only that dose. Do not take double or extra doses. What may interact with this medicine? -aspirin and aspirin-like medicines -certain  antibiotics -chloral hydrate -cisplatin -cyclosporine -digoxin -diuretics -laxatives -lithium -medicines for blood pressure -medicines that relax muscles for surgery -methotrexate -NSAIDs, medicines for pain and inflammation like ibuprofen, naproxen, or indomethacin -phenytoin -steroid medicines like prednisone or cortisone -sucralfate -thyroid hormones This list may not describe all possible interactions. Give your health care provider a list of all the medicines, herbs, non-prescription drugs, or dietary supplements you use. Also tell them if you smoke, drink alcohol, or use illegal drugs. Some items may interact with your medicine. What should I watch for while using this medicine? Visit your doctor or health care professional for regular checks on your progress. Check your blood pressure regularly. Ask your doctor or health care professional what your blood pressure should be, and when you should contact him or her. If you are a diabetic, check your blood sugar as directed. You may need to be on a special diet while taking this medicine. Check with your doctor. Also, ask how many glasses of fluid you need to drink a day. You must not get dehydrated. You may get drowsy or dizzy. Do not drive, use machinery, or do anything that needs mental alertness until you know how this drug affects you. Do not stand or sit up quickly, especially if you are an older patient. This reduces the risk of dizzy or fainting spells. Alcohol can make you more drowsy and dizzy. Avoid alcoholic drinks. This medicine can make you more sensitive to the sun. Keep out of the sun. If you cannot avoid being in the sun, wear protective clothing and  use sunscreen. Do not use sun lamps or tanning beds/booths. What side effects may I notice from receiving this medicine? Side effects that you should report to your doctor or health care professional as soon as possible: -blood in urine or stools -dry mouth -fever or  chills -hearing loss or ringing in the ears -irregular heartbeat -muscle pain or weakness, cramps -skin rash -stomach upset, pain, or nausea -tingling or numbness in the hands or feet -unusually weak or tired -vomiting or diarrhea -yellowing of the eyes or skin Side effects that usually do not require medical attention (report to your doctor or health care professional if they continue or are bothersome): -headache -loss of appetite -unusual bleeding or bruising This list may not describe all possible side effects. Call your doctor for medical advice about side effects. You may report side effects to FDA at 1-800-FDA-1088. Where should I keep my medicine? Keep out of the reach of children. Store at room temperature between 15 and 30 degrees C (59 and 86 degrees F). Protect from light. Throw away any unused medicine after the expiration date. NOTE: This sheet is a summary. It may not cover all possible information. If you have questions about this medicine, talk to your doctor, pharmacist, or health care provider.    2016, Elsevier/Gold Standard. (2015-01-11 13:49:50) Hydralazine tablets What is this medicine? HYDRALAZINE (hye DRAL a zeen) is a type of vasodilator. It relaxes blood vessels, increasing the blood and oxygen supply to your heart. This medicine is used to treat high blood pressure. This medicine may be used for other purposes; ask your health care provider or pharmacist if you have questions. What should I tell my health care provider before I take this medicine? They need to know if you have any of these conditions: -blood vessel disease -heart disease including angina or history of heart attack -kidney or liver disease -systemic lupus erythematosus (SLE) -an unusual or allergic reaction to hydralazine, tartrazine dye, other medicines, foods, dyes, or preservatives -pregnant or trying to get pregnant -breast-feeding How should I use this medicine? Take this medicine  by mouth with a glass of water. Follow the directions on the prescription label. Take your doses at regular intervals. Do not take your medicine more often than directed. Do not stop taking except on the advice of your doctor or health care professional. Talk to your pediatrician regarding the use of this medicine in children. Special care may be needed. While this drug may be prescribed for children for selected conditions, precautions do apply. Overdosage: If you think you have taken too much of this medicine contact a poison control center or emergency room at once. NOTE: This medicine is only for you. Do not share this medicine with others. What if I miss a dose? If you miss a dose, take it as soon as you can. If it is almost time for your next dose, take only that dose. Do not take double or extra doses. What may interact with this medicine? -medicines for high blood pressure -medicines for mental depression This list may not describe all possible interactions. Give your health care provider a list of all the medicines, herbs, non-prescription drugs, or dietary supplements you use. Also tell them if you smoke, drink alcohol, or use illegal drugs. Some items may interact with your medicine. What should I watch for while using this medicine? Visit your doctor or health care professional for regular checks on your progress. Check your blood pressure and pulse rate regularly. Ask your  doctor or health care professional what your blood pressure and pulse rate should be and when you should contact him or her. You may get drowsy or dizzy. Do not drive, use machinery, or do anything that needs mental alertness until you know how this medicine affects you. Do not stand or sit up quickly, especially if you are an older patient. This reduces the risk of dizzy or fainting spells. Alcohol may interfere with the effect of this medicine. Avoid alcoholic drinks. Do not treat yourself for coughs, colds, or pain  while you are taking this medicine without asking your doctor or health care professional for advice. Some ingredients may increase your blood pressure. What side effects may I notice from receiving this medicine? Side effects that you should report to your doctor or health care professional as soon as possible: -chest pain, or fast or irregular heartbeat -fever, chills, or sore throat -numbness or tingling in the hands or feet -shortness of breath -skin rash, redness, blisters or itching -stiff or swollen joints -sudden weight gain -swelling of the feet or legs -swollen lymph glands -unusual weakness Side effects that usually do not require medical attention (report to your doctor or health care professional if they continue or are bothersome): -diarrhea, or constipation -headache -loss of appetite -nausea, vomiting This list may not describe all possible side effects. Call your doctor for medical advice about side effects. You may report side effects to FDA at 1-800-FDA-1088. Where should I keep my medicine? Keep out of the reach of children. Store at room temperature between 15 and 30 degrees C (59 and 86 degrees F). Throw away any unused medicine after the expiration date. NOTE: This sheet is a summary. It may not cover all possible information. If you have questions about this medicine, talk to your doctor, pharmacist, or health care provider.    2016, Elsevier/Gold Standard. (2008-03-04 15:44:58)

## 2016-06-29 NOTE — Progress Notes (Signed)
SATURATION QUALIFICATIONS: (This note is used to comply with regulatory documentation for home oxygen)  Patient Saturations on Room Air at Rest = 84%  Patient Saturations on Room Air while Ambulating =n/a  Patient Saturations on 4 Liters of oxygen while at Rest = 94%  Please briefly explain why patient needs home oxygen:

## 2016-06-29 NOTE — Progress Notes (Signed)
Patient is stable for discharge. Discharge instructions and medications have been reviewed with the patient and all questions answered. Prescriptions given. Patient educated on Oxygen use and keeping it on at all times. CM notified of discharge and she stated that patient was ok to leave as long as he had an O2 tank for travel. Patient does have a home O2 tank at the bedside.

## 2016-06-29 NOTE — Discharge Summary (Addendum)
Physician Discharge Summary  Ricardo Burch [id number] DOB: 02/14/1961 DOA: 06/27/2016  PCP: Ricardo Ends, MD  Admit date: 06/27/2016 Discharge date: 06/29/2016  Recommendations for Outpatient Follow-up:  Continue lasix, potassium and hydralazine as prescribed Hydralazine was added for better blood pressure control, 10 mg to be taken 3 times a day Follow up with PCP in 1-2 weeks to make sure your symptoms are stable.  Discharge Diagnoses:  Active Problems:   Tobacco abuse   ARF (acute renal failure) (HCC)   CHF (congestive heart failure) (HCC)   Alcohol abuse   Acute diastolic (congestive) heart failure ([place])    Discharge Condition: stable   Diet recommendation: as tolerated   History of present illness:  Per brief narrative 8//25/2017 "55 y.o.malewith medical history significant of Grade 2 diastolic CHF, COPD, and Sleep apnea. He lost his residence 3 months ago and has been without his medications and has been living on the street and eating fast food. He is supposed to wear O2 at home but has been without that as well. He comes to the ER with complaints of leg swelling and SOB.  Patient also admits to drinking alcohol, smoking marijuana and having some cocaine."   Hospital Course:   Acute diastolic CHF / Acute respiratory failure with hypoxia  - On 2 D ECHO EF 55-60% with garde 1 DD - Stable respiratory status - No LE edema - May continue lasix per home dose with low dose potassium supplementation  COPD - Continue home oxygen   Chronic kidney disease stage 3 - Baseline 1.2-1.3 - Cr now WNL  Alcohol abuse - No withdrawals - Counseled on drug abuse   Tobacco abuse - Counseled on cessation    Code Status: full Family Communication: no family at the bedside this am   Consultants:  none  Procedures:  2 D ECHO 06/28/2016 - EF 55 - 60%, grade 1 DD  Antibiotics:  None    Signed:  Leisa Lenz, MD  [facility] 06/29/2016, 11:51  AM  Pager #: [phone]  Time spent in minutes: less than 30 minutes   Discharge Exam: Vitals:   06/28/16 2100 06/29/16 0432  BP: (!) 146/86 (!) 151/89  Pulse: 98 91  Resp: 20 20  Temp: 99.6 F (37.6 C) 98.6 F (37 C)   Vitals:   06/29/16 0825 06/29/16 0828 06/29/16 0832 06/29/16 0836  BP:      Pulse:      Resp:      Temp:      TempSrc:      SpO2: (!) 84% 94% 93% 93%  Weight:      Height:        General: Pt is alert, follows commands appropriately, not in acute distress Cardiovascular: Regular rate and rhythm, S1/S2 + Respiratory: Clear to auscultation bilaterally, no wheezing, no crackles, no rhonchi Abdominal: Soft, non tender, non distended, bowel sounds +, no guarding Extremities: no edema, no cyanosis, pulses palpable bilaterally DP and PT Neuro: Grossly nonfocal  Discharge Instructions  Discharge Instructions    Diet - low sodium heart healthy    Complete by:  As directed   Discharge instructions    Complete by:  As directed   Continue lasix, potassium and hydralazine as prescribed Hydralazine was added for better blood pressure control, 10 mg to be taken 3 times a day Follow up with PCP in 1-2 weeks to make sure your symptoms are stable.   Increase activity slowly    Complete by:  As directed  Medication List    STOP taking these medications   varenicline 0.5 MG X 11 & 1 MG X 42 tablet Commonly known as:  CHANTIX STARTING MONTH PAK   varenicline 1 MG tablet Commonly known as:  CHANTIX CONTINUING MONTH PAK     TAKE these medications   albuterol 108 (90 Base) MCG/ACT inhaler Commonly known as:  PROVENTIL HFA;VENTOLIN HFA Inhale 2 puffs into the lungs every 6 (six) hours as needed for wheezing or shortness of breath.   aspirin EC 81 MG tablet Take 1 tablet (81 mg total) by mouth daily.   budesonide-formoterol 80-4.5 MCG/ACT inhaler Commonly known as:  SYMBICORT Inhale 2 puffs into the lungs 2 (two) times daily. What changed:  Another  medication with the same name was removed. Continue taking this medication, and follow the directions you see here.   furosemide 20 MG tablet Commonly known as:  LASIX Take 1 tablet (20 mg total) by mouth daily. OV needed for additional refills   guaiFENesin 600 MG 12 hr tablet Commonly known as:  MUCINEX Take 2 tablets (1,200 mg total) by mouth 2 (two) times daily. What changed:  how much to take  when to take this   hydrALAZINE 10 MG tablet Commonly known as:  APRESOLINE Take 1 tablet (10 mg total) by mouth 3 (three) times daily.   potassium chloride 10 MEQ tablet Commonly known as:  K-DUR Take 1 tablet (10 mEq total) by mouth daily.   tiotropium 18 MCG inhalation capsule Commonly known as:  SPIRIVA Place 1 capsule (18 mcg total) into inhaler and inhale daily.      Follow-up Information    Ricardo Ends, MD. Schedule an appointment as soon as possible for a visit in 1 week(s).   Specialty:  Family Medicine Contact information: [address] [place] [address] [phone]            The results of significant diagnostics from this hospitalization (including imaging, microbiology, ancillary and laboratory) are listed below for reference.    Significant Diagnostic Studies: Dg Chest 2 View  Result Date: 06/27/2016 CLINICAL DATA:  Shortness of breath for 2-3 days. EXAM: CHEST  2 VIEW COMPARISON:  Cough and congestion.  History of COPD. FINDINGS: The cardiac silhouette is enlarged. There is widening of the mediastinal contours. There is no evidence of focal airspace consolidation, pleural effusion or pneumothorax. Mildly increased interstitial markings. Osseous structures are without acute abnormality. Soft tissues are grossly normal. Chronic deformity of mid left lateral ribs. IMPRESSION: Abnormal widening of the mediastinal contours which may represent abnormal vascular shadows such as enlargement of the main pulmonary trunk, however aortic pathology cannot  be excluded. If patient presents with suggestive symptomatology, CT angiogram of the chest should be considered. Mild increased interstitial markings which may be seen with pulmonary vascular congestion. Electronically Signed   By: Fidela Salisbury M.D.   On: 06/27/2016 13:23    Microbiology: No results found for this or any previous visit (from the past 240 hour(s)).   Labs: Basic Metabolic Panel:  Recent Labs Lab 06/27/16 1302 06/28/16 0517 06/29/16 0402  NA 135 137 135  K 3.5 3.6 3.2*  CL 94* 93* 87*  CO2 34* 37* 41*  GLUCOSE 139* 114* 128*  BUN 28* 21* 13  CREATININE 1.54* 1.33* 0.97  CALCIUM 7.9* 8.1* 8.0*   Liver Function Tests:  Recent Labs Lab 06/27/16 1302  AST 84*  ALT 60  ALKPHOS 90  BILITOT 1.1  PROT 6.9  ALBUMIN  3.5   No results for input(s): LIPASE, AMYLASE in the last 168 hours. No results for input(s): AMMONIA in the last 168 hours. CBC:  Recent Labs Lab 06/27/16 1302 06/28/16 0517  WBC 11.7* 12.3*  HGB 16.1 16.3  HCT 49.1 51.7  MCV 102.9* 105.7*  PLT 211 243   Cardiac Enzymes: No results for input(s): CKTOTAL, CKMB, CKMBINDEX, TROPONINI in the last 168 hours. BNP: BNP (last 3 results)  Recent Labs  09/16/15 1140 09/18/15 0500 06/27/16 1302  BNP 1,091.5* 359.1* 1,435.2*    ProBNP (last 3 results) No results for input(s): PROBNP in the last 8760 hours.  CBG: No results for input(s): GLUCAP in the last 168 hours.

## 2016-07-10 ENCOUNTER — Emergency Department (HOSPITAL_COMMUNITY): Payer: Medicaid Other

## 2016-07-10 ENCOUNTER — Encounter (HOSPITAL_COMMUNITY): Payer: Self-pay

## 2016-07-10 ENCOUNTER — Telehealth: Payer: Self-pay | Admitting: Pulmonary Disease

## 2016-07-10 ENCOUNTER — Emergency Department (HOSPITAL_COMMUNITY)
Admission: EM | Admit: 2016-07-10 | Discharge: 2016-07-11 | Disposition: A | Discharge status: Home / Self Care | Payer: Medicaid Other | Attending: Emergency Medicine | Admitting: Emergency Medicine

## 2016-07-10 DIAGNOSIS — J449 Chronic obstructive pulmonary disease, unspecified: Secondary | ICD-10-CM | POA: Insufficient documentation

## 2016-07-10 DIAGNOSIS — F17210 Nicotine dependence, cigarettes, uncomplicated: Secondary | ICD-10-CM | POA: Diagnosis not present

## 2016-07-10 DIAGNOSIS — I5031 Acute diastolic (congestive) heart failure: Secondary | ICD-10-CM | POA: Diagnosis not present

## 2016-07-10 DIAGNOSIS — Z79899 Other long term (current) drug therapy: Secondary | ICD-10-CM | POA: Insufficient documentation

## 2016-07-10 DIAGNOSIS — Y829 Unspecified medical devices associated with adverse incidents: Secondary | ICD-10-CM | POA: Diagnosis not present

## 2016-07-10 DIAGNOSIS — Z7982 Long term (current) use of aspirin: Secondary | ICD-10-CM | POA: Diagnosis not present

## 2016-07-10 DIAGNOSIS — R51 Headache: Secondary | ICD-10-CM | POA: Insufficient documentation

## 2016-07-10 DIAGNOSIS — R519 Headache, unspecified: Secondary | ICD-10-CM

## 2016-07-10 DIAGNOSIS — T465X5A Adverse effect of other antihypertensive drugs, initial encounter: Secondary | ICD-10-CM | POA: Insufficient documentation

## 2016-07-10 DIAGNOSIS — G43909 Migraine, unspecified, not intractable, without status migrainosus: Secondary | ICD-10-CM | POA: Diagnosis present

## 2016-07-10 DIAGNOSIS — Z7951 Long term (current) use of inhaled steroids: Secondary | ICD-10-CM | POA: Diagnosis not present

## 2016-07-10 DIAGNOSIS — T50905A Adverse effect of unspecified drugs, medicaments and biological substances, initial encounter: Secondary | ICD-10-CM | POA: Insufficient documentation

## 2016-07-10 LAB — CBC WITH DIFFERENTIAL/PLATELET
BASOS ABS: 0 10*3/uL (ref 0.0–0.1)
BASOS PCT: 0 %
EOS ABS: 0.1 10*3/uL (ref 0.0–0.7)
Eosinophils Relative: 1 %
HCT: 49.2 % (ref 39.0–52.0)
HEMOGLOBIN: 16 g/dL (ref 13.0–17.0)
Lymphocytes Relative: 21 %
Lymphs Abs: 2.1 10*3/uL (ref 0.7–4.0)
MCH: 33.5 pg (ref 26.0–34.0)
MCHC: 32.5 g/dL (ref 30.0–36.0)
MCV: 102.9 fL — ABNORMAL HIGH (ref 78.0–100.0)
MONOS PCT: 10 %
Monocytes Absolute: 1 10*3/uL (ref 0.1–1.0)
NEUTROS ABS: 6.9 10*3/uL (ref 1.7–7.7)
NEUTROS PCT: 68 %
Platelets: 228 10*3/uL (ref 150–400)
RBC: 4.78 MIL/uL (ref 4.22–5.81)
RDW: 14.9 % (ref 11.5–15.5)
WBC: 10 10*3/uL (ref 4.0–10.5)

## 2016-07-10 LAB — BASIC METABOLIC PANEL
ANION GAP: 2 — AB (ref 5–15)
BUN: 9 mg/dL (ref 6–20)
CALCIUM: 8.6 mg/dL — AB (ref 8.9–10.3)
CO2: 39 mmol/L — ABNORMAL HIGH (ref 22–32)
Chloride: 96 mmol/L — ABNORMAL LOW (ref 101–111)
Creatinine, Ser: 1.04 mg/dL (ref 0.61–1.24)
GLUCOSE: 83 mg/dL (ref 65–99)
POTASSIUM: 4.3 mmol/L (ref 3.5–5.1)
SODIUM: 137 mmol/L (ref 135–145)

## 2016-07-10 LAB — I-STAT TROPONIN, ED: TROPONIN I, POC: 0 ng/mL (ref 0.00–0.08)

## 2016-07-10 LAB — RAPID URINE DRUG SCREEN, HOSP PERFORMED
Amphetamines: NOT DETECTED
Barbiturates: NOT DETECTED
Benzodiazepines: POSITIVE — AB
Cocaine: NOT DETECTED
OPIATES: NOT DETECTED
Tetrahydrocannabinol: POSITIVE — AB

## 2016-07-10 LAB — BRAIN NATRIURETIC PEPTIDE: B NATRIURETIC PEPTIDE 5: 961.2 pg/mL — AB (ref 0.0–100.0)

## 2016-07-10 MED ORDER — DIPHENHYDRAMINE HCL 50 MG/ML IJ SOLN
25.0000 mg | Freq: Once | INTRAMUSCULAR | Status: AC
  Administered 2016-07-10: 25 mg via INTRAVENOUS
  Filled 2016-07-10: qty 1

## 2016-07-10 MED ORDER — KETOROLAC TROMETHAMINE 30 MG/ML IJ SOLN
30.0000 mg | Freq: Once | INTRAMUSCULAR | Status: AC
  Administered 2016-07-10: 30 mg via INTRAVENOUS
  Filled 2016-07-10: qty 1

## 2016-07-10 MED ORDER — PROCHLORPERAZINE EDISYLATE 5 MG/ML IJ SOLN
10.0000 mg | Freq: Once | INTRAMUSCULAR | Status: AC
  Administered 2016-07-10: 10 mg via INTRAVENOUS
  Filled 2016-07-10: qty 2

## 2016-07-10 NOTE — ED Provider Notes (Signed)
[facility] DEPT Provider Note   CSN: [id number] Arrival date & time: 07/10/16  2041     History   Chief Complaint Chief Complaint  Patient presents with  . Other    Hypoxia  . Migraine   HPI  Ricardo Burch is an 55 y.o. male with history of CHF, COPD, and sleep apnea who presents to the ED for evaluation of headache. He states he was started on hydralazine after discharge from the hospital two weeks ago. For the past week he has had a daily diffuse headache. He feels it is from the hydralazine. Denies blurred vision. Denies numbness or weakness. Denies fever, chills, dizziness. Denies photophobia or phonophobia.he has not tried anything for the pain. He has not tried calling his primary care provider.  Notably, while in the ED pt's portable oxygen tank ran out of oxygen. He typically uses 2L via [place] around the clock. He was found by nursing staff in triage to have SpO2 <70% with increased work of breathing. He was put on NRB and sats increased to 100%. He was transitioned to 2L O2 via [place] and now feels almost back to baseline. He states he has felt intermittently more short of breath over the past couple weeks. Thinks his legs are swollen. Was discharged two weeks ago for drug induced and medication noncompliance heart failure with respiratory failure.  Past Medical History:  Diagnosis Date  . CHF (congestive heart failure) ([place])   . Complication of anesthesia    never been put to sleep  . COPD (chronic obstructive pulmonary disease) ([place]) Dx 2015  . Eczema    since childhood   . Sleep apnea    CPAP    Patient Active Problem List   Diagnosis Date Noted  . CHF (congestive heart failure) ([employer]) 06/27/2016  . Alcohol abuse 06/27/2016  . Acute diastolic (congestive) heart failure ([place]) 06/27/2016  . ARF (acute renal failure) ([place])   . Acute respiratory failure ([place]) 09/16/2015  . Elevated BP 08/24/2015  . COPD exacerbation ([place]) 08/24/2015  . Left elbow contusion 05/26/2015  .  Healthcare maintenance 05/26/2015  . COPD (chronic obstructive pulmonary disease) ([place]) 08/23/2014  . Diastolic CHF ([place]) [id number]  . Eczema 08/23/2014  . Right knee pain 08/23/2014  . Tobacco abuse 03/27/2014    Past Surgical History:  Procedure Laterality Date  . MULTIPLE EXTRACTIONS WITH ALVEOLOPLASTY N/A 03/22/2016   Procedure: MULTIPLE EXTRACTION WITH ALVEOLOPLASTY;  Surgeon: Diona Browner, DDS;  Location: [facility];  Service: Oral Surgery;  Laterality: N/A;  . NO PAST SURGERIES         Home Medications    Prior to Admission medications   Medication Sig Start Date End Date Taking? Authorizing Provider  albuterol (PROVENTIL HFA;VENTOLIN HFA) 108 (90 Base) MCG/ACT inhaler Inhale 2 puffs into the lungs every 6 (six) hours as needed for wheezing or shortness of breath. 06/29/16   Robbie Lis, MD  aspirin EC 81 MG tablet Take 1 tablet (81 mg total) by mouth daily. 09/20/15   Florencia Reasons, MD  budesonide-formoterol (SYMBICORT) 80-4.5 MCG/ACT inhaler Inhale 2 puffs into the lungs 2 (two) times daily. 05/21/16   Brand Males, MD  furosemide (LASIX) 20 MG tablet Take 1 tablet (20 mg total) by mouth daily. OV needed for additional refills 06/29/16   Robbie Lis, MD  guaiFENesin (MUCINEX) 600 MG 12 hr tablet Take 2 tablets (1,200 mg total) by mouth 2 (two) times daily. Patient taking differently: Take 600 mg by mouth every 12 (twelve)  hours.  09/20/15   Florencia Reasons, MD  hydrALAZINE (APRESOLINE) 10 MG tablet Take 1 tablet (10 mg total) by mouth 3 (three) times daily. 06/29/16   Robbie Lis, MD  potassium chloride (K-DUR) 10 MEQ tablet Take 1 tablet (10 mEq total) by mouth daily. 06/29/16   Robbie Lis, MD  tiotropium (SPIRIVA) 18 MCG inhalation capsule Place 1 capsule (18 mcg total) into inhaler and inhale daily. 05/10/16   Boykin Nearing, MD    Family History Family History  Problem Relation Age of Onset  . Cerebral aneurysm Father   . Cancer Neg Hx   . Diabetes Neg Hx   . Heart disease Neg  Hx     Social History Social History  Substance Use Topics  . Smoking status: Current Every Day Smoker    Packs/day: 0.50    Years: 30.00    Types: Cigarettes  . Smokeless tobacco: Never Used  . Alcohol use 0.6 oz/week    1 Cans of beer per week     Comment: occassionally     Allergies   Review of patient's allergies indicates no known allergies.   Review of Systems Review of Systems 10 Systems reviewed and are negative for acute change except as noted in the HPI.   Physical Exam Updated Vital Signs BP 144/93 (BP Location: Left Arm)   Pulse 96   Temp 98.6 F (37 C) (Oral)   Resp (!) 27   SpO2 100%   Physical Exam  Constitutional: He is oriented to person, place, and time. Nasal cannula in place.  HENT:  Right Ear: External ear normal.  Left Ear: External ear normal.  Nose: Nose normal.  Mouth/Throat: Oropharynx is clear and moist. No oropharyngeal exudate.  Eyes: Conjunctivae and EOM are normal. Pupils are equal, round, and reactive to light.  Neck: Neck supple.  Cardiovascular: Normal rate, regular rhythm, normal heart sounds and intact distal pulses.   Pulmonary/Chest: Effort normal. No respiratory distress.  Diminished breath sounds throughout No increased WOB  Abdominal: Soft. Bowel sounds are normal. He exhibits no distension. There is no tenderness. There is no rebound and no guarding.  Musculoskeletal: He exhibits no edema.  Lymphadenopathy:    He has no cervical adenopathy.  Neurological: He is alert and oriented to person, place, and time. No cranial nerve deficit.  5/5 strength throughout Normal finger to nose No pronator drift  Skin: Skin is warm and dry.  Psychiatric: He has a normal mood and affect.  Nursing note and vitals reviewed.    ED Treatments / Results  Labs (all labs ordered are listed, but only abnormal results are displayed) Labs Reviewed  BASIC METABOLIC PANEL - Abnormal; Notable for the following:       Result Value    Chloride 96 (*)    CO2 39 (*)    Calcium 8.6 (*)    Anion gap 2 (*)    All other components within normal limits  BRAIN NATRIURETIC PEPTIDE - Abnormal; Notable for the following:    B Natriuretic Peptide 961.2 (*)    All other components within normal limits  CBC WITH DIFFERENTIAL/PLATELET - Abnormal; Notable for the following:    MCV 102.9 (*)    All other components within normal limits  URINE RAPID DRUG SCREEN, HOSP PERFORMED - Abnormal; Notable for the following:    Benzodiazepines POSITIVE (*)    Tetrahydrocannabinol POSITIVE (*)    All other components within normal limits  I-STAT TROPOININ, ED  EKG  EKG Interpretation None       Radiology Dg Chest 2 View  Result Date: 07/10/2016 CLINICAL DATA:  Hypoxia and shortness of breath. Labored breathing. Migraine headache. EXAM: CHEST  2 VIEW COMPARISON:  06/27/2016 FINDINGS: Mild prominence of the main pulmonary artery may reflect pulmonary arterial hypertension. Mild-to-moderate enlargement of the cardiopericardial silhouette. Left hemithoracic deformity due to old rib fractures. Linear density along the minor fissure could represent fluid or atelectasis. No blunting of the costophrenic angles. There is mild upper zone pulmonary vascular prominence but no overt edema. IMPRESSION: 1. Mild-to-moderate enlargement of the cardiopericardial silhouette with some cephalization of blood flow which may reflect pulmonary venous hypertension, but no overt edema. 2. Chronic prominence the main pulmonary artery which could reflect pulmonary arterial hypertension. 3. Left rib deformities due to old fractures. 4. Trace fluid or minimal atelectasis along the minor fissure. Electronically Signed   By: Van Clines M.D.   On: 07/10/2016 22:55    Procedures Procedures (including critical care time)  Medications Ordered in ED Medications  ketorolac (TORADOL) 30 MG/ML injection 30 mg (30 mg Intravenous Given 07/10/16 2240)  prochlorperazine  (COMPAZINE) injection 10 mg (10 mg Intravenous Given 07/10/16 2240)  diphenhydrAMINE (BENADRYL) injection 25 mg (25 mg Intravenous Given 07/10/16 2240)     Initial Impression / Assessment and Plan / ED Course  I have reviewed the triage vital signs and the nursing notes.  Pertinent labs & imaging results that were available during my care of the patient were reviewed by me and considered in my medical decision making (see chart for details).  Clinical Course   Labs at baseline. CXR and EKG nonacute. Back on nasal cannula pt is at his baseline oxygen requirement. CM assisted in contacting [facility] who brought a portable oxygen tank for pt. Headache resolved with headache cocktail. Neuro exam intact. Did discuss with pt that hydralazine does have hadaches as a common side effect and for him to schedule PCP f/u ASAP. ER return precautions given.  Final Clinical Impressions(s) / ED Diagnoses   Final diagnoses:  Acute nonintractable headache, unspecified headache type  Medication reaction, initial encounter    New Prescriptions Discharge Medication List as of 07/11/2016  1:00 AM       Anne Ng, PA-C 07/11/16 1028    Davonna Belling, MD 07/12/16 0010

## 2016-07-10 NOTE — Telephone Encounter (Signed)
LMTCB

## 2016-07-10 NOTE — ED Triage Notes (Addendum)
Pt arrived w/ c/o Migraine developing after taking Hydralazine Rx. SPO2 of 69% noted in triage. Pt placed on 100% NRB, increasing pts SPO2 to 100%. Pt appears to have labored breathing and states sob. Pt home supplemental O2 tank empty upon arrival. Pt having to catch breath between sentences. Pt denies cp.

## 2016-07-10 NOTE — ED Notes (Signed)
Patient transported to X-ray 

## 2016-07-10 NOTE — Progress Notes (Signed)
EDCM spoke to [facility] who reports he will have someone bring patient an oxygen tank to the ED.  EDCM informed Brad of [facility] that patients reports his concentrator isn't working.  Leroy Sea reports he will have the technician speak to patient regarding concentrator.  He reports he will have someone follow up with patient in am.  Patient made aware.  ED charge and EDPA made aware.  No further EDCM needs at this time.

## 2016-07-10 NOTE — Progress Notes (Signed)
EDCM called [facility] regarding oxygen.  Awaiting return call.

## 2016-07-11 NOTE — Telephone Encounter (Signed)
lmtcb x2 for pt. 

## 2016-07-11 NOTE — ED Notes (Signed)
Case management went into room to set up O2 tank but left the key for the tank. He went back to go get the key and will be back.

## 2016-07-11 NOTE — Discharge Instructions (Signed)
Your headache improved in the emergency room today. Headaches can be a side effect of the blood pressure medicine you were started on (Hydralazine). Please follow up with your primary care provider as soon as possible this week for further evaluation. We contacted [facility] who brought a replacement oxygen tank for you tonight. However, your primary care provider or pulmonologist will have to write you a prescription for refills if you need one. Return to the emergency room for new or worsening symptoms.

## 2016-07-12 NOTE — Telephone Encounter (Signed)
lmtcb x3 for pt. 

## 2016-07-13 ENCOUNTER — Emergency Department (HOSPITAL_COMMUNITY)
Admission: EM | Admit: 2016-07-13 | Discharge: 2016-07-13 | Disposition: A | Discharge status: Home / Self Care | Payer: Medicaid Other | Attending: Emergency Medicine | Admitting: Emergency Medicine

## 2016-07-13 ENCOUNTER — Encounter (HOSPITAL_COMMUNITY): Payer: Self-pay | Admitting: *Deleted

## 2016-07-13 ENCOUNTER — Emergency Department (HOSPITAL_COMMUNITY): Payer: Medicaid Other

## 2016-07-13 DIAGNOSIS — Z79899 Other long term (current) drug therapy: Secondary | ICD-10-CM | POA: Insufficient documentation

## 2016-07-13 DIAGNOSIS — J449 Chronic obstructive pulmonary disease, unspecified: Secondary | ICD-10-CM | POA: Diagnosis not present

## 2016-07-13 DIAGNOSIS — Z7982 Long term (current) use of aspirin: Secondary | ICD-10-CM | POA: Diagnosis not present

## 2016-07-13 DIAGNOSIS — I5031 Acute diastolic (congestive) heart failure: Secondary | ICD-10-CM | POA: Diagnosis not present

## 2016-07-13 DIAGNOSIS — F17210 Nicotine dependence, cigarettes, uncomplicated: Secondary | ICD-10-CM | POA: Diagnosis not present

## 2016-07-13 DIAGNOSIS — R0602 Shortness of breath: Secondary | ICD-10-CM | POA: Diagnosis present

## 2016-07-13 DIAGNOSIS — J441 Chronic obstructive pulmonary disease with (acute) exacerbation: Secondary | ICD-10-CM

## 2016-07-13 LAB — BASIC METABOLIC PANEL
Anion gap: 6 (ref 5–15)
BUN: 5 mg/dL — AB (ref 6–20)
CALCIUM: 8.7 mg/dL — AB (ref 8.9–10.3)
CO2: 38 mmol/L — ABNORMAL HIGH (ref 22–32)
Chloride: 90 mmol/L — ABNORMAL LOW (ref 101–111)
Creatinine, Ser: 0.99 mg/dL (ref 0.61–1.24)
GFR calc Af Amer: >60 mL/min (ref >60)
GLUCOSE: 142 mg/dL — AB (ref 65–99)
Potassium: 4.1 mmol/L (ref 3.5–5.1)
Sodium: 134 mmol/L — ABNORMAL LOW (ref 135–145)

## 2016-07-13 LAB — CBC
HCT: 50.9 % (ref 39.0–52.0)
Hemoglobin: 16.2 g/dL (ref 13.0–17.0)
MCH: 33 pg (ref 26.0–34.0)
MCHC: 31.8 g/dL (ref 30.0–36.0)
MCV: 103.7 fL — ABNORMAL HIGH (ref 78.0–100.0)
PLATELETS: 243 10*3/uL (ref 150–400)
RBC: 4.91 MIL/uL (ref 4.22–5.81)
RDW: 14.5 % (ref 11.5–15.5)
WBC: 11.3 10*3/uL — ABNORMAL HIGH (ref 4.0–10.5)

## 2016-07-13 LAB — RAPID URINE DRUG SCREEN, HOSP PERFORMED
AMPHETAMINES: NOT DETECTED
BARBITURATES: NOT DETECTED
BENZODIAZEPINES: POSITIVE — AB
COCAINE: NOT DETECTED
Opiates: NOT DETECTED
TETRAHYDROCANNABINOL: NOT DETECTED

## 2016-07-13 LAB — TROPONIN I: Troponin I: 0.03 ng/mL (ref <0.03)

## 2016-07-13 MED ORDER — IPRATROPIUM BROMIDE 0.02 % IN SOLN
0.5000 mg | Freq: Once | RESPIRATORY_TRACT | Status: AC
  Administered 2016-07-13: 0.5 mg via RESPIRATORY_TRACT
  Filled 2016-07-13: qty 2.5

## 2016-07-13 MED ORDER — ALBUTEROL SULFATE (2.5 MG/3ML) 0.083% IN NEBU
5.0000 mg | INHALATION_SOLUTION | Freq: Once | RESPIRATORY_TRACT | Status: AC
  Administered 2016-07-13: 5 mg via RESPIRATORY_TRACT
  Filled 2016-07-13: qty 6

## 2016-07-13 NOTE — ED Notes (Signed)
Reported to Charge Nurse, Gilberto Better, pt.s  Troponin is Elevated 0.03

## 2016-07-13 NOTE — ED Notes (Signed)
Provided patient with coke and graham crackers while he waits for social work.

## 2016-07-13 NOTE — ED Notes (Signed)
Awaiting follow-up call from [facility] r/e patient's O2 tank. RN called, per MD, requesting one be delivered to patient's home in the morning since he is out.

## 2016-07-13 NOTE — ED Notes (Signed)
Pt O2 saturation noted to be at 86% while on his normal 2lpm. Pt was coached to take deep breaths, but O2 saturation did not improve. The oxygen was increased to 3lpm and O2 saturation increased to normal limits.

## 2016-07-13 NOTE — ED Triage Notes (Addendum)
The pt has copd and continues to smoke  Nasal 02 at 2 liters po 90-92%

## 2016-07-13 NOTE — ED Notes (Signed)
Patient verbalized understanding of discharge instructions and denies any further needs or questions at this time. VS stable. Patient ambulatory with steady gait. Escorted to ED entrance in wheelchair.   

## 2016-07-13 NOTE — ED Notes (Signed)
Called and left a message with social work r/e patient's home health care and oxygen tanks.

## 2016-07-13 NOTE — ED Notes (Signed)
Melissa with [facility] called back and stated that a respiratory therapist would review the patient's oxygen set-up to see if there is anything else that can be done. According to [facility], patient has the refillable tanks and portable tanks to fill up for when he goes out. MD made aware.

## 2016-07-13 NOTE — ED Notes (Signed)
MD at bedside. 

## 2016-07-13 NOTE — Discharge Instructions (Signed)
It was our pleasure to provide your ER care today - we hope that you feel better.  We discussed your case with your home health agency - they indicate they will have a respiratory therapist and/or technician come out to your home in the next 1-2 days.  In the mean time, make sure to use your home tank, and make sure if leaving home, that you have enough oxygen in portable tank for the time you will be away from home.  Use albuterol treatment as need.  Avoid any smoking.  Follow up with your primary care doctor in the coming week.  Return to ER if worse, new symptoms, fevers, trouble breathing, chest pain, other concern.

## 2016-07-13 NOTE — ED Provider Notes (Addendum)
[facility] DEPT Provider Note   CSN: [id number] Arrival date & time: 07/13/16  1603     History   Chief Complaint Chief Complaint  Patient presents with  . Shortness of Breath    HPI Ricardo Burch is a 55 y.o. male.  Patient with hx copd, c/o his oxygen tank being almost out at home, and his home health agency not able to deliver a tank to his home.  States feels sob when off oxygen. Mild sob, persistent, improved w o2 and/or breathing tx. States is supposed to be on 2-3 liters all the time. When on oxygen feels at baseline.  Denies new or increased cough. No fever or chills. Patient denies chest pain. No increased leg swelling or pain. Compliant w normal meds. +smoker. Uses albuterol prn.    The history is provided by the patient.    Past Medical History:  Diagnosis Date  . CHF (congestive heart failure) ([place])   . Complication of anesthesia    never been put to sleep  . COPD (chronic obstructive pulmonary disease) ([place]) Dx 2015  . Eczema    since childhood   . Sleep apnea    CPAP    Patient Active Problem List   Diagnosis Date Noted  . CHF (congestive heart failure) ([place]) 06/27/2016  . Alcohol abuse 06/27/2016  . Acute diastolic (congestive) heart failure ([place]) 06/27/2016  . ARF (acute renal failure) ([place])   . Acute respiratory failure ([place]) 09/16/2015  . Elevated BP 08/24/2015  . COPD exacerbation ([place]) 08/24/2015  . Left elbow contusion 05/26/2015  . Healthcare maintenance 05/26/2015  . COPD (chronic obstructive pulmonary disease) ([place]) 08/23/2014  . Diastolic CHF ([place]) [id number]  . Eczema 08/23/2014  . Right knee pain 08/23/2014  . Tobacco abuse 03/27/2014    Past Surgical History:  Procedure Laterality Date  . MULTIPLE EXTRACTIONS WITH ALVEOLOPLASTY N/A 03/22/2016   Procedure: MULTIPLE EXTRACTION WITH ALVEOLOPLASTY;  Surgeon: Diona Browner, DDS;  Location: [facility];  Service: Oral Surgery;  Laterality: N/A;  . NO PAST SURGERIES         Home  Medications    Prior to Admission medications   Medication Sig Start Date End Date Taking? Authorizing Provider  albuterol (PROVENTIL HFA;VENTOLIN HFA) 108 (90 Base) MCG/ACT inhaler Inhale 2 puffs into the lungs every 6 (six) hours as needed for wheezing or shortness of breath. 06/29/16  Yes Robbie Lis, MD  aspirin EC 81 MG tablet Take 1 tablet (81 mg total) by mouth daily. 09/20/15  Yes Florencia Reasons, MD  budesonide-formoterol [facility]) 80-4.5 MCG/ACT inhaler Inhale 2 puffs into the lungs 2 (two) times daily. 05/21/16  Yes Brand Males, MD  furosemide (LASIX) 20 MG tablet Take 1 tablet (20 mg total) by mouth daily. OV needed for additional refills Patient taking differently: Take 20 mg by mouth every morning.  06/29/16  Yes Robbie Lis, MD  guaiFENesin (MUCINEX) 600 MG 12 hr tablet Take 2 tablets (1,200 mg total) by mouth 2 (two) times daily. Patient taking differently: Take 600 mg by mouth every 12 (twelve) hours.  09/20/15  Yes Florencia Reasons, MD  OXYGEN Inhale 2 L into the lungs continuous.   Yes Historical Provider, MD  hydrALAZINE (APRESOLINE) 10 MG tablet Take 1 tablet (10 mg total) by mouth 3 (three) times daily. Patient not taking: Reported on 07/13/2016 06/29/16   Robbie Lis, MD  potassium chloride (K-DUR) 10 MEQ tablet Take 1 tablet (10 mEq total) by mouth daily. 06/29/16   Link Snuffer  Charlies Silvers, MD  tiotropium (SPIRIVA) 18 MCG inhalation capsule Place 1 capsule (18 mcg total) into inhaler and inhale daily. 05/10/16   Boykin Nearing, MD    Family History Family History  Problem Relation Age of Onset  . Cerebral aneurysm Father   . Cancer Neg Hx   . Diabetes Neg Hx   . Heart disease Neg Hx     Social History Social History  Substance Use Topics  . Smoking status: Current Every Day Smoker    Packs/day: 0.50    Years: 30.00    Types: Cigarettes  . Smokeless tobacco: Never Used  . Alcohol use 0.6 oz/week    1 Cans of beer per week     Comment: occassionally     Allergies    Apresoline [hydralazine] and Hydrocodone   Review of Systems Review of Systems  Constitutional: Negative for chills.  HENT: Negative for sore throat.   Eyes: Negative for redness.  Respiratory: Positive for shortness of breath.   Cardiovascular: Negative for chest pain and leg swelling.  Gastrointestinal: Negative for abdominal pain and vomiting.  Genitourinary: Negative for flank pain.  Musculoskeletal: Negative for neck pain.  Skin: Negative for rash.  Neurological: Negative for headaches.  Hematological: Does not bruise/bleed easily.  Psychiatric/Behavioral: Negative for confusion.     Physical Exam Updated Vital Signs BP 130/89 (BP Location: Right Arm)   Pulse 96   Temp 98.8 F (37.1 C)   Resp 21   Ht 5\' 7"  (1.702 m)   Wt 105.7 kg   SpO2 97%   BMI 36.49 kg/m   Physical Exam  Constitutional: He appears well-developed and well-nourished. No distress.  HENT:  Head: Atraumatic.  Mouth/Throat: Oropharynx is clear and moist.  Eyes: Pupils are equal, round, and reactive to light.  Neck: Neck supple. No JVD present. No tracheal deviation present.  Cardiovascular: Normal rate, regular rhythm, normal heart sounds and intact distal pulses.  Exam reveals no gallop and no friction rub.   No murmur heard. Pulmonary/Chest: Effort normal. No accessory muscle usage. No respiratory distress. He has wheezes. He exhibits no tenderness.  Abdominal: Soft. Bowel sounds are normal. He exhibits no distension. There is no tenderness.  Musculoskeletal: He exhibits no edema or tenderness.  Neurological: He is alert.  Skin: Skin is warm and dry. He is not diaphoretic.  Psychiatric: He has a normal mood and affect.  Nursing note and vitals reviewed.    ED Treatments / Results  Labs (all labs ordered are listed, but only abnormal results are displayed) Results for orders placed or performed during the hospital encounter of [id number]  Basic metabolic panel  Result Value Ref Range    Sodium 134 (L) 135 - 145 mmol/L   Potassium 4.1 3.5 - 5.1 mmol/L   Chloride 90 (L) 101 - 111 mmol/L   CO2 38 (H) 22 - 32 mmol/L   Glucose, Bld 142 (H) 65 - 99 mg/dL   BUN 5 (L) 6 - 20 mg/dL   Creatinine, Ser 0.99 0.61 - 1.24 mg/dL   Calcium 8.7 (L) 8.9 - 10.3 mg/dL   GFR calc non Af Amer >60 >60 mL/min   GFR calc Af Amer >60 >60 mL/min   Anion gap 6 5 - 15  CBC  Result Value Ref Range   WBC 11.3 (H) 4.0 - 10.5 K/uL   RBC 4.91 4.22 - 5.81 MIL/uL   Hemoglobin 16.2 13.0 - 17.0 g/dL   HCT 50.9 39.0 - 52.0 %   MCV 103.7 (  H) 78.0 - 100.0 fL   MCH 33.0 26.0 - 34.0 pg   MCHC 31.8 30.0 - 36.0 g/dL   RDW 14.5 11.5 - 15.5 %   Platelets 243 150 - 400 K/uL  Troponin I  Result Value Ref Range   Troponin I 0.03 (HH) <0.03 ng/mL   Dg Chest 2 View  Result Date: 07/13/2016 CLINICAL DATA:  Chest pain, shortness of Breath EXAM: CHEST  2 VIEW COMPARISON:  07/10/2016 FINDINGS: Cardiomegaly again noted. Old left rib fractures. Stable scarring in right midlung. No infiltrate or pulmonary edema. Mild degenerative changes midthoracic spine. IMPRESSION: Cardiomegaly. No active disease. Old left rib fractures again noted. Stable scarring right midlung. Electronically Signed   By: Lahoma Crocker M.D.   On: 07/13/2016 17:00     EKG  EKG Interpretation  Date/Time:  Saturday July 13 2016 16:10:09 EDT Ventricular Rate:  112 PR Interval:  182 QRS Duration: 162 QT Interval:  344 QTC Calculation: 469 R Axis:   169 Text Interpretation:  Sinus tachycardia Biatrial enlargement Right bundle branch block Septal infarct , age undetermined T wave abnormality, consider lateral ischemia Abnormal ECG Confirmed by RAY MD, Andee Poles [id number]) on 07/13/2016 4:17:23 PM       Radiology Dg Chest 2 View  Result Date: 07/13/2016 CLINICAL DATA:  Chest pain, shortness of Breath EXAM: CHEST  2 VIEW COMPARISON:  07/10/2016 FINDINGS: Cardiomegaly again noted. Old left rib fractures. Stable scarring in right midlung. No infiltrate  or pulmonary edema. Mild degenerative changes midthoracic spine. IMPRESSION: Cardiomegaly. No active disease. Old left rib fractures again noted. Stable scarring right midlung. Electronically Signed   By: Lahoma Crocker M.D.   On: 07/13/2016 17:00    Procedures Procedures (including critical care time)  Medications Ordered in ED Medications  albuterol (PROVENTIL) (2.5 MG/3ML) 0.083% nebulizer solution 5 mg (5 mg Nebulization Given 07/13/16 1827)  ipratropium (ATROVENT) nebulizer solution 0.5 mg (0.5 mg Nebulization Given 07/13/16 1827)     Initial Impression / Assessment and Plan / ED Course  I have reviewed the triage vital signs and the nursing notes.  Pertinent labs & imaging results that were available during my care of the patient were reviewed by me and considered in my medical decision making (see chart for details).  Clinical Course   Cxr. Labs.  Reviewed nursing notes and prior charts for additional history.   Albuterol and atrovent neb.  Patient indicates his portable/[place] o2 tank is almost out of oxygen, and that had him concern, as if he is out/about without o2, he feels sob.  He indicates his large/home o2 tank is full.   CM consulted re home health supplies.   Unable to reach CM or social work.    Patients Rn to contact home health agency re new/full portable tank.  On discussion w [facility], they indicate that patient has adequate o2 at home, and that they will send out RT/technician to ensure patient has the best system in place for him as relates using o2 away from home/portable.   In meantime, patient indicates his large/home tank is full, and can use in interim.   Recheck, no wheezing or increased wob. No chest pain. Afeb.  Patient currently appears stable for d/c.     Final Clinical Impressions(s) / ED Diagnoses   Final diagnoses:  None    New Prescriptions New Prescriptions   No medications on file     Lajean Saver, MD 07/13/16 2002    Lajean Saver,  MD 07/13/16 2104

## 2016-07-13 NOTE — ED Triage Notes (Signed)
The pt is c/o chest pain since this am  With sob  He uses home 02 and he ran out of 02 tanks he was seen at [facility]

## 2016-07-15 ENCOUNTER — Telehealth: Payer: Self-pay | Admitting: Pulmonary Disease

## 2016-07-15 NOTE — Progress Notes (Signed)
Confirmed with [facility] [phone] that O2 was delivered to Mr Ruberg 9/10 at 1400.

## 2016-07-15 NOTE — Telephone Encounter (Signed)
Attempted to call pt. No answer, voicemail is full. We have attempted to contact the pt several times with no success. Per triage protocol, message will be closed.

## 2016-07-15 NOTE — Telephone Encounter (Signed)
Spoke with pt and he states that home fill system has not been working. He states that he has called [facility] several times and they have not come out and fixed his tank. Chart review shows that the ED contacted [facility] and they then contacted pt and have come out and fixed/replaced O2 home fill system. Pt states that he is feeling better and has appt with Dr Adrian Blackwater later this week.   Nothing further needed.

## 2016-07-18 ENCOUNTER — Inpatient Hospital Stay: Payer: Medicaid Other | Admitting: Family Medicine

## 2016-07-19 ENCOUNTER — Telehealth: Payer: Self-pay | Admitting: Pulmonary Disease

## 2016-07-19 NOTE — Telephone Encounter (Signed)
lmtcb X1 for pt  

## 2016-07-19 NOTE — Telephone Encounter (Signed)
Spoke with pt, aware that per last OV note (july 2017) pt is to be wearing 2lpm with exertion.  Pt expressed understanding.    Pt states he has had lots of trouble with his 02 and has had multiple hospital visits since his last OV, and believes that he needs a higher liter flow.  I advised that we could not titrate his dose over the phone, and that he would need to be evaluated in office.  Pt scheduled for Wednesday at 2:15.  Nothing further needed.

## 2016-07-23 ENCOUNTER — Ambulatory Visit: Payer: Medicaid Other | Attending: Family Medicine | Admitting: Physician Assistant

## 2016-07-23 ENCOUNTER — Telehealth: Payer: Self-pay

## 2016-07-23 ENCOUNTER — Encounter: Payer: Self-pay | Admitting: Physician Assistant

## 2016-07-23 VITALS — BP 130/91 | HR 93 | Temp 97.8°F | Wt 248.2 lb

## 2016-07-23 DIAGNOSIS — I5032 Chronic diastolic (congestive) heart failure: Secondary | ICD-10-CM | POA: Diagnosis not present

## 2016-07-23 DIAGNOSIS — Z09 Encounter for follow-up examination after completed treatment for conditions other than malignant neoplasm: Secondary | ICD-10-CM

## 2016-07-23 DIAGNOSIS — I5033 Acute on chronic diastolic (congestive) heart failure: Secondary | ICD-10-CM

## 2016-07-23 MED ORDER — POLYETHYLENE GLYCOL 3350 17 GM/SCOOP PO POWD: 17.0000 g | Freq: Two times a day (BID) | ORAL | 1 refills | Status: DC | PRN

## 2016-07-23 MED ORDER — POTASSIUM CHLORIDE ER 10 MEQ PO TBCR: 10.0000 meq | EXTENDED_RELEASE_TABLET | Freq: Two times a day (BID) | ORAL | 11 refills | Status: DC

## 2016-07-23 MED ORDER — FUROSEMIDE 20 MG PO TABS: 20.0000 mg | ORAL_TABLET | Freq: Two times a day (BID) | ORAL | 1 refills | Status: DC

## 2016-07-23 NOTE — Patient Instructions (Signed)
Get a scale and check weight after urinating 1st thing in am Increase Lasix twice daily increase potassium to twice daily Return to clinic on Friday for follow up

## 2016-07-23 NOTE — Progress Notes (Signed)
Pt states he is here for the following:  Hospitalization for CHF

## 2016-07-23 NOTE — Telephone Encounter (Signed)
This CM spoke to Zettie Pho, [place] who approved a referral to [facility] if the patient  is eligible for services/tele-monitoring.    This CM spoke to Heath Lark, [place] at [facility] who confirmed that he has [place] Access 2 and she recommended that a referral be submitted for an assessment.  CM to contact the patient to discuss the [facility] programs prior to making a referral.

## 2016-07-23 NOTE — Progress Notes (Signed)
Ricardo Burch  [id number]  [id number]  DOB - 1961-08-16  Chief Complaint  Patient presents with  . Hospitalization Follow-up    CHF       Subjective:   Ricardo Burch is a 55 y.o. male here today for establishment of care. He has a PMH of dHF, COPD, OSA, chronic resp failure on O2, polysub abuse and CKD. He was hospitalized August 24-26. He initially presented with increased shortness of breath or lower extremity edema. He had been out of his medications for at least 3 months. He also is homeless and eating whatever he can find. He is chronically supposed to be on oxygen but has not been compliant with his lack of resources. He was admitted by the internal medicine team. An echo showed one out of four diastolic dysfunction with normal ejection fraction. He was diuresed successfully. His meds were changed slightly at discharge.  He represented to the ED on 07/10/2016 with a complaint of headache. He represented to the ED on 07/13/2016 with issues with his oxygen tank.  Since discharge he still is pretty dyspneic with minimal exertion. He has increased oxygen requirement at times up to 4 L. He has been constipated. No abdominal pain. No nausea or vomiting. No chest pain. Still has swelling in his lower extremity.  He is currently staying in his friends basement.     ROS: GEN: denies fever or chills, denies change in weight Skin: denies lesions or rashes HEENT: denies headache, earache, epistaxis, sore throat, or neck pain LUNGS: + SHOB, +dyspnea, PND, orthopnea CV: denies CP or palpitations ABD: denies abd pain, N or V EXT: denies muscle spasms + swelling; no pain in lower ext, no weakness NEURO: denies numbness or tingling, denies sz, stroke or TIA  ALLERGIES: Allergies  Allergen Reactions  . Apresoline [Hydralazine] Other (See Comments)    Headache   . Hydrocodone Nausea And Vomiting    PAST MEDICAL HISTORY: Past Medical History:  Diagnosis Date  . CHF (congestive  heart failure) ([place])   . Complication of anesthesia    never been put to sleep  . COPD (chronic obstructive pulmonary disease) ([place]) Dx 2015  . Eczema    since childhood   . Sleep apnea    CPAP    PAST SURGICAL HISTORY: Past Surgical History:  Procedure Laterality Date  . MULTIPLE EXTRACTIONS WITH ALVEOLOPLASTY N/A 03/22/2016   Procedure: MULTIPLE EXTRACTION WITH ALVEOLOPLASTY;  Surgeon: Diona Browner, DDS;  Location: [place];  Service: Oral Surgery;  Laterality: N/A;  . NO PAST SURGERIES      MEDICATIONS AT HOME: Prior to Admission medications   Medication Sig Start Date End Date Taking? Authorizing Provider  albuterol (PROVENTIL HFA;VENTOLIN HFA) 108 (90 Base) MCG/ACT inhaler Inhale 2 puffs into the lungs every 6 (six) hours as needed for wheezing or shortness of breath. 06/29/16  Yes Robbie Lis, MD  aspirin EC 81 MG tablet Take 1 tablet (81 mg total) by mouth daily. 09/20/15  Yes Florencia Reasons, MD  budesonide-formoterol [facility]) 80-4.5 MCG/ACT inhaler Inhale 2 puffs into the lungs 2 (two) times daily. 05/21/16  Yes Brand Males, MD  furosemide (LASIX) 20 MG tablet Take 1 tablet (20 mg total) by mouth 2 (two) times daily. OV needed for additional refills 07/23/16  Yes Tiffany Daneil Dan, PA-C  OXYGEN Inhale 2 L into the lungs continuous.   Yes Historical Provider, MD  guaiFENesin (MUCINEX) 600 MG 12 hr tablet Take 2 tablets (1,200 mg total) by mouth 2 (two)  times daily. Patient not taking: Reported on 07/23/2016 09/20/15   Florencia Reasons, MD  hydrALAZINE (APRESOLINE) 10 MG tablet Take 1 tablet (10 mg total) by mouth 3 (three) times daily. Patient not taking: Reported on 07/23/2016 06/29/16   Robbie Lis, MD  polyethylene glycol powder (GLYCOLAX/MIRALAX) powder Take 17 g by mouth 2 (two) times daily as needed. 07/23/16   Tiffany Daneil Dan, PA-C  potassium chloride (K-DUR) 10 MEQ tablet Take 1 tablet (10 mEq total) by mouth 2 (two) times daily. 07/23/16   Tiffany Daneil Dan, PA-C  tiotropium (SPIRIVA) 18 MCG  inhalation capsule Place 1 capsule (18 mcg total) into inhaler and inhale daily. Patient not taking: Reported on 07/23/2016 05/10/16   Boykin Nearing, MD     Objective:   Vitals:   07/23/16 1103  BP: (!) 130/91  Pulse: 93  Temp: 97.8 F (36.6 C)  TempSrc: Oral  SpO2: 94%  Weight: 248 lb 3.2 oz (112.6 kg)    Exam General appearance : Awake, alert, not in any distress. Speech Clear. Not toxic looking HEENT: Atraumatic and Normocephalic, pupils equally reactive to light and accomodation Neck: supple, no JVD. No cervical lymphadenopathy.  Chest:Good air entry bilaterally, no added sounds  CVS: S1 S2 regular, no murmurs.  Abdomen: Bowel sounds present, Non tender and not distended with no gaurding, rigidity or rebound. Extremities: B/L Lower Ext shows no edema, both legs are warm to touch Neurology: Awake alert, and oriented X 3, CN II-XII intact, Non focal Skin:No Rash Wounds:N/A  Data Review Lab Results  Component Value Date   HGBA1C 6.2 (H) 09/16/2015   HGBA1C 5.8 (H) 03/27/2014     Assessment & Plan  1. Acute on chronic CHF  -Increase Lasix 920 mg BID), TED hose and elevation encouraged  -compliance encouraged  -HHN  -check BMP  -weight checks; oral intake discussed; low salt diet  -RTC in 3 days for recheck  2. Constipation  -Miralax prn   3. Chronic resp failure on O2  -has appt with pulm tomorrow  -Cont O2  -treating CHF  -smoking cessation discussed  4. Hx polysubstance abuse  -abstinence encouraged  5. CKD stage 3-check BMP  Return in about 3 days (around 07/26/2016).  The patient was given clear instructions to go to ER or return to medical center if symptoms don't improve, worsen or new problems develop. The patient verbalized understanding. The patient was told to call to get lab results if they haven't heard anything in the next week.   This note has been created with Surveyor, quantity. Any  transcriptional errors are unintentional.    Zettie Pho, PA-C [facility] and [place], [place]   07/23/2016, 11:48 AM

## 2016-07-24 ENCOUNTER — Telehealth: Payer: Self-pay

## 2016-07-24 ENCOUNTER — Encounter: Payer: Self-pay | Admitting: Pulmonary Disease

## 2016-07-24 ENCOUNTER — Ambulatory Visit (INDEPENDENT_AMBULATORY_CARE_PROVIDER_SITE_OTHER): Payer: Medicaid Other | Admitting: Pulmonary Disease

## 2016-07-24 ENCOUNTER — Inpatient Hospital Stay: Payer: Medicaid Other

## 2016-07-24 VITALS — BP 126/82 | HR 95 | Ht 67.0 in | Wt 244.6 lb

## 2016-07-24 DIAGNOSIS — G4733 Obstructive sleep apnea (adult) (pediatric): Secondary | ICD-10-CM

## 2016-07-24 DIAGNOSIS — J439 Emphysema, unspecified: Secondary | ICD-10-CM

## 2016-07-24 MED FILL — POLYETHYLENE GLYCOL 3350: 30 days supply | Qty: 510 | Fill #0

## 2016-07-24 MED FILL — FUROSEMIDE 20 MG TABLET: 20 | 30 days supply | Qty: 60 | Fill #0

## 2016-07-24 MED FILL — POTASSIUM CL 10 MEQ TAB SA: 10 | 30 days supply | Qty: 60 | Fill #0

## 2016-07-24 NOTE — Patient Instructions (Signed)
Continue using his Symbicort. Will send in a prescription for Spiriva and give samples if we have any. Make sure that he gets the new CPAP mask and start using CPAP on a regular basis. Your primary care doctor has increased her Lasix dose. Please make sure that you're compliant with this. Monitor lower extremity edema and daily weights. Advised to stop alcohol and tobacco use.  Return to clinic in 6 months.

## 2016-07-24 NOTE — Telephone Encounter (Signed)
Attempted to contact the patient to inquire if he has a preference for home health agency before a referral is made for RN services. CM to also inquire if he is interested in a referral to [facility]. Call placed to # [phone] and a HIPAA compliant voicemail message requesting a call back to # [phone] or [phone].

## 2016-07-24 NOTE — Progress Notes (Signed)
Ricardo Burch    [id number]    December 29, 1960  Primary Care Physician:Burch, Ricardo Laity, MD  Referring Physician: Boykin Nearing, MD [address] [place], [place] [address]  Chief complaint:   Follow up for Severe COPD Severe OSA on autoset Active smoker  HPI: [name] is a 55 year old active smoker, COPD. He's had multiple hospitalizations with COPD, CHF exacerbations in the past. His has become homeless in May 2017 and admits to excessive alcohol, drug use, dietary indiscretion since then. He is currently living in the basement of his friend's home.  He has increasing lower extremity edema, dyspnea, PND, denies any cough, sputum production, fevers, chills, hemoptysis, wheezing. He's had to increase his oxygen to about 4 L to maintain sats greater than 90%. His Lasix dose was increased yesterday by primary care physician.  Outpatient Encounter Prescriptions as of 07/24/2016  Medication Sig  . albuterol (PROVENTIL HFA;VENTOLIN HFA) 108 (90 Base) MCG/ACT inhaler Inhale 2 puffs into the lungs every 6 (six) hours as needed for wheezing or shortness of breath.  [name] aspirin EC 81 MG tablet Take 1 tablet (81 mg total) by mouth daily.  . budesonide-formoterol (SYMBICORT) 80-4.5 MCG/ACT inhaler Inhale 2 puffs into the lungs 2 (two) times daily.  . furosemide (LASIX) 20 MG tablet Take 1 tablet (20 mg total) by mouth 2 (two) times daily. OV needed for additional refills  . guaiFENesin (MUCINEX) 600 MG 12 hr tablet Take 2 tablets (1,200 mg total) by mouth 2 (two) times daily.  . hydrALAZINE (APRESOLINE) 10 MG tablet Take 1 tablet (10 mg total) by mouth 3 (three) times daily.  . OXYGEN Inhale 2 L into the lungs continuous.  . polyethylene glycol powder (GLYCOLAX/MIRALAX) powder Take 17 g by mouth 2 (two) times daily as needed.  . potassium chloride (K-DUR) 10 MEQ tablet Take 1 tablet (10 mEq total) by mouth 2 (two) times daily.  [name] tiotropium (SPIRIVA) 18 MCG inhalation capsule Place 1  capsule (18 mcg total) into inhaler and inhale daily. (Patient not taking: Reported on 07/24/2016)   No facility-administered encounter medications on file as of 07/24/2016.     Allergies as of 07/24/2016 - Review Complete 07/24/2016  Allergen Reaction Noted  . Apresoline [hydralazine] Other (See Comments) 07/10/2016  . Hydrocodone Nausea And Vomiting 07/13/2016    Past Medical History:  Diagnosis Date  . CHF (congestive heart failure) ([place])   . Complication of anesthesia    never been put to sleep  . COPD (chronic obstructive pulmonary disease) ([place]) Dx 2015  . Eczema    since childhood   . Sleep apnea    CPAP    Past Surgical History:  Procedure Laterality Date  . MULTIPLE EXTRACTIONS WITH ALVEOLOPLASTY N/A 03/22/2016   Procedure: MULTIPLE EXTRACTION WITH ALVEOLOPLASTY;  Surgeon: Diona Browner, DDS;  Location: [place];  Service: Oral Surgery;  Laterality: N/A;  . NO PAST SURGERIES      Family History  Problem Relation Age of Onset  . Cerebral aneurysm Father   . Cancer Neg Hx   . Diabetes Neg Hx   . Heart disease Neg Hx     Social History   Social History  . Marital status: Single    Spouse name: N/A  . Number of children: 4   . Years of education: 12   Occupational History  . Unemployed     Social History Main Topics  . Smoking status: Current Every Day Smoker    Packs/day:  0.50    Years: 30.00    Types: Cigarettes  . Smokeless tobacco: Never Used  . Alcohol use 0.6 oz/week    1 Cans of beer per week     Comment: occassionally  . Drug use:     Types: Marijuana     Comment: used in the last week  . Sexual activity: No     Comment: pt. graduzlly cutting back using chan   Other Topics Concern  . Not on file   Social History Narrative    Lives alone.    4 daughters 7, 26 yo twins, eldest married live in [place].     Review of systems: Review of Systems  Constitutional: Negative for fever and chills.  HENT: Negative.   Eyes: Negative for blurred  vision.  Respiratory: as per HPI  Cardiovascular: Negative for chest pain and palpitations.  Gastrointestinal: Negative for vomiting, diarrhea, blood per rectum. Genitourinary: Negative for dysuria, urgency, frequency and hematuria.  Musculoskeletal: Negative for myalgias, back pain and joint pain.  Skin: Negative for itching and rash.  Neurological: Negative for dizziness, tremors, focal weakness, seizures and loss of consciousness.  Endo/Heme/Allergies: Negative for environmental allergies.  Psychiatric/Behavioral: Negative for depression, suicidal ideas and hallucinations.  All other systems reviewed and are negative.   Physical Exam: Blood pressure 126/82, pulse 95, height 5\' 7"  (1.702 m), weight 110.9 kg (244 lb 9.6 oz), SpO2 95 %. Gen:      No acute distress HEENT:  EOMI, sclera anicteric Neck:     No masses; no thyromegaly Lungs:    B/L crackles, no wheeze; normal respiratory effort CV:         Regular rate and rhythm; no murmurs, 1+ edema Abd:      + bowel sounds; soft, non-tender; no palpable masses, no distension Ext:    No edema; adequate peripheral perfusion Skin:      Warm and dry; no rash Neuro: alert and oriented x 3 Psych: normal mood and affect  Data Reviewed: CXR  (09/15/15) 1. Hyperinflation. 2. Stable cardiomegaly.  Echo (09/18/15) The right ventricular systolic pressure was increased consistent with moderate pulmonary hypertension.moderate concentric LVH. LVEF 60-65 percent. Grade 2 diastolic dysfunction. RV cavity size severe grade dilated. RV systolic function moderately to severely reduced. PA pressure 56  Sleep study 10/22/15 Severe OSA, AHI 151. Started on an AutoSet  CPAP compliance report 06/23/16- 07/22/16 3% usage greater than 4 hours.  PFTs 01/16/16 FVC 2.43 [58%] FEV1 1.49 [45%) F/F 61 TLC 81% DLCO 56% Severe obstruction, moderate reduction in diffusion capacity.  Assessment:  #1 COPD Severe COPD. He is maintained on Spiriva and  Symbicort. He has recent worsening dyspnea, hypoxemia secondary to heart failure in the setting of alcohol, drug, dietary indiscretion. I have told him to increase his O2 level to maintain oxygen saturations greater than 90%. He is advised to continue the increased Lasix dose, monitor his lower extremity edema and weights. He needs to stop excessive smoking, alcohol, drug use. I advised him to go to the emergency room for any worsening of his symptoms.  #2 Severe OSA On autoset CPA with poor compliance. I reiterated the need to be on CPAP every night.   #3 Tobacco use Continues to smoke in spite of attempts to quit.  #4 Pulmonary hypertension I suspect this is secondary to COPD and OSA. We will reevaluate with repeat echo at next visit.  Plan/Recommendations: - Continue symbicort, spiriva - Continue increased dose of lasix. - Monitor LE edema  and daily weights  Marshell Garfinkel MD [facility] Pager [phone] 07/24/2016, 2:19 PM  CC: Boykin Nearing, MD   [phone]

## 2016-07-25 ENCOUNTER — Telehealth: Payer: Self-pay

## 2016-07-25 ENCOUNTER — Telehealth: Payer: Self-pay | Admitting: Pulmonary Disease

## 2016-07-25 DIAGNOSIS — J441 Chronic obstructive pulmonary disease with (acute) exacerbation: Secondary | ICD-10-CM

## 2016-07-25 NOTE — Telephone Encounter (Signed)
This Case Manager placed call to patient to discuss home health services and [facility] services. Patient indicated he is uncertain if he prefers home health or [facility] services.  He requested time to decide. He also said he has a friend who is a Equities trader that he may have fill his medication box. He indicated he would follow-up with Case Manager at a later time regarding his decision.

## 2016-07-25 NOTE — Telephone Encounter (Signed)
LMOMTCB x 1 

## 2016-07-26 ENCOUNTER — Encounter (HOSPITAL_COMMUNITY): Payer: Self-pay | Admitting: *Deleted

## 2016-07-26 ENCOUNTER — Inpatient Hospital Stay (HOSPITAL_COMMUNITY)
Admission: EM | Admit: 2016-07-26 | Discharge: 2016-07-30 | DRG: 208 | Disposition: A | Discharge status: Home / Self Care | Payer: Medicaid Other | Attending: Pulmonary Disease | Admitting: Pulmonary Disease

## 2016-07-26 ENCOUNTER — Emergency Department (HOSPITAL_COMMUNITY): Payer: Medicaid Other

## 2016-07-26 DIAGNOSIS — J441 Chronic obstructive pulmonary disease with (acute) exacerbation: Secondary | ICD-10-CM | POA: Diagnosis present

## 2016-07-26 DIAGNOSIS — I5033 Acute on chronic diastolic (congestive) heart failure: Secondary | ICD-10-CM | POA: Diagnosis present

## 2016-07-26 DIAGNOSIS — F32A Depression, unspecified: Secondary | ICD-10-CM

## 2016-07-26 DIAGNOSIS — I110 Hypertensive heart disease with heart failure: Secondary | ICD-10-CM | POA: Diagnosis present

## 2016-07-26 DIAGNOSIS — K5900 Constipation, unspecified: Secondary | ICD-10-CM | POA: Diagnosis present

## 2016-07-26 DIAGNOSIS — E662 Morbid (severe) obesity with alveolar hypoventilation: Secondary | ICD-10-CM | POA: Diagnosis present

## 2016-07-26 DIAGNOSIS — Z9981 Dependence on supplemental oxygen: Secondary | ICD-10-CM | POA: Diagnosis not present

## 2016-07-26 DIAGNOSIS — R0602 Shortness of breath: Secondary | ICD-10-CM | POA: Diagnosis not present

## 2016-07-26 DIAGNOSIS — G4733 Obstructive sleep apnea (adult) (pediatric): Secondary | ICD-10-CM

## 2016-07-26 DIAGNOSIS — J9601 Acute respiratory failure with hypoxia: Secondary | ICD-10-CM

## 2016-07-26 DIAGNOSIS — J9622 Acute and chronic respiratory failure with hypercapnia: Secondary | ICD-10-CM | POA: Diagnosis present

## 2016-07-26 DIAGNOSIS — I4510 Unspecified right bundle-branch block: Secondary | ICD-10-CM | POA: Diagnosis present

## 2016-07-26 DIAGNOSIS — Z7951 Long term (current) use of inhaled steroids: Secondary | ICD-10-CM | POA: Diagnosis not present

## 2016-07-26 DIAGNOSIS — Z6837 Body mass index (BMI) 37.0-37.9, adult: Secondary | ICD-10-CM | POA: Diagnosis not present

## 2016-07-26 DIAGNOSIS — F329 Major depressive disorder, single episode, unspecified: Secondary | ICD-10-CM | POA: Diagnosis present

## 2016-07-26 DIAGNOSIS — I5031 Acute diastolic (congestive) heart failure: Secondary | ICD-10-CM | POA: Diagnosis not present

## 2016-07-26 DIAGNOSIS — Z720 Tobacco use: Secondary | ICD-10-CM | POA: Diagnosis present

## 2016-07-26 DIAGNOSIS — J9621 Acute and chronic respiratory failure with hypoxia: Secondary | ICD-10-CM | POA: Diagnosis present

## 2016-07-26 DIAGNOSIS — Z4659 Encounter for fitting and adjustment of other gastrointestinal appliance and device: Secondary | ICD-10-CM

## 2016-07-26 DIAGNOSIS — F1010 Alcohol abuse, uncomplicated: Secondary | ICD-10-CM | POA: Diagnosis present

## 2016-07-26 DIAGNOSIS — R000 Tachycardia, unspecified: Secondary | ICD-10-CM | POA: Diagnosis present

## 2016-07-26 DIAGNOSIS — Z7982 Long term (current) use of aspirin: Secondary | ICD-10-CM | POA: Diagnosis not present

## 2016-07-26 DIAGNOSIS — F17210 Nicotine dependence, cigarettes, uncomplicated: Secondary | ICD-10-CM | POA: Diagnosis present

## 2016-07-26 DIAGNOSIS — F3341 Major depressive disorder, recurrent, in partial remission: Secondary | ICD-10-CM

## 2016-07-26 DIAGNOSIS — G9340 Encephalopathy, unspecified: Secondary | ICD-10-CM | POA: Diagnosis present

## 2016-07-26 DIAGNOSIS — Z01818 Encounter for other preprocedural examination: Secondary | ICD-10-CM

## 2016-07-26 DIAGNOSIS — I248 Other forms of acute ischemic heart disease: Secondary | ICD-10-CM | POA: Diagnosis present

## 2016-07-26 DIAGNOSIS — Z9989 Dependence on other enabling machines and devices: Secondary | ICD-10-CM

## 2016-07-26 DIAGNOSIS — F1410 Cocaine abuse, uncomplicated: Secondary | ICD-10-CM

## 2016-07-26 DIAGNOSIS — I5032 Chronic diastolic (congestive) heart failure: Secondary | ICD-10-CM

## 2016-07-26 DIAGNOSIS — J9602 Acute respiratory failure with hypercapnia: Secondary | ICD-10-CM | POA: Diagnosis not present

## 2016-07-26 LAB — I-STAT TROPONIN, ED
TROPONIN I, POC: 0.01 ng/mL (ref 0.00–0.08)
TROPONIN I, POC: 0.04 ng/mL (ref 0.00–0.08)

## 2016-07-26 LAB — RAPID URINE DRUG SCREEN, HOSP PERFORMED
Amphetamines: NOT DETECTED
BARBITURATES: NOT DETECTED
BENZODIAZEPINES: NOT DETECTED
Cocaine: POSITIVE — AB
Opiates: NOT DETECTED
TETRAHYDROCANNABINOL: NOT DETECTED

## 2016-07-26 LAB — CBC
HEMATOCRIT: 51.6 % (ref 39.0–52.0)
HEMOGLOBIN: 16.5 g/dL (ref 13.0–17.0)
MCH: 33.7 pg (ref 26.0–34.0)
MCHC: 32 g/dL (ref 30.0–36.0)
MCV: 105.3 fL — AB (ref 78.0–100.0)
Platelets: 170 10*3/uL (ref 150–400)
RBC: 4.9 MIL/uL (ref 4.22–5.81)
RDW: 14.2 % (ref 11.5–15.5)
WBC: 11.3 10*3/uL — ABNORMAL HIGH (ref 4.0–10.5)

## 2016-07-26 LAB — I-STAT CHEM 8, ED
BUN: 15 mg/dL (ref 6–20)
CHLORIDE: 81 mmol/L — AB (ref 101–111)
CREATININE: 0.8 mg/dL (ref 0.61–1.24)
Calcium, Ion: 1.01 mmol/L — ABNORMAL LOW (ref 1.15–1.40)
GLUCOSE: 114 mg/dL — AB (ref 65–99)
HEMATOCRIT: 55 % — AB (ref 39.0–52.0)
HEMOGLOBIN: 18.7 g/dL — AB (ref 13.0–17.0)
POTASSIUM: 4.2 mmol/L (ref 3.5–5.1)
Sodium: 132 mmol/L — ABNORMAL LOW (ref 135–145)
TCO2: 44 mmol/L (ref 0–100)

## 2016-07-26 LAB — BRAIN NATRIURETIC PEPTIDE: B Natriuretic Peptide: 905.4 pg/mL — ABNORMAL HIGH (ref 0.0–100.0)

## 2016-07-26 MED ORDER — IPRATROPIUM BROMIDE 0.02 % IN SOLN
0.5000 mg | Freq: Once | RESPIRATORY_TRACT | Status: AC
  Administered 2016-07-26: 0.5 mg via RESPIRATORY_TRACT
  Filled 2016-07-26: qty 2.5

## 2016-07-26 MED ORDER — PREDNISONE 20 MG PO TABS
60.0000 mg | ORAL_TABLET | Freq: Once | ORAL | Status: DC
  Filled 2016-07-26: qty 3

## 2016-07-26 MED ORDER — ALBUTEROL (5 MG/ML) CONTINUOUS INHALATION SOLN
10.0000 mg/h | INHALATION_SOLUTION | Freq: Once | RESPIRATORY_TRACT | Status: AC
  Administered 2016-07-26: 10 mg/h via RESPIRATORY_TRACT
  Filled 2016-07-26: qty 20

## 2016-07-26 MED ORDER — METHYLPREDNISOLONE SODIUM SUCC 125 MG IJ SOLR
60.0000 mg | Freq: Four times a day (QID) | INTRAMUSCULAR | Status: DC
  Administered 2016-07-26 – 2016-07-27 (×2): 60 mg via INTRAVENOUS
  Filled 2016-07-26 (×3): qty 2

## 2016-07-26 MED ORDER — ALBUTEROL (5 MG/ML) CONTINUOUS INHALATION SOLN
10.0000 mg/h | INHALATION_SOLUTION | Freq: Once | RESPIRATORY_TRACT | Status: AC
  Administered 2016-07-26: 10 mg/h via RESPIRATORY_TRACT

## 2016-07-26 NOTE — Telephone Encounter (Signed)
LMTCB

## 2016-07-26 NOTE — H&P (Signed)
History and Physical    Ricardo Burch [id number] DOB: April 16, 1961 DOA: 07/26/2016  PCP: [name], MD   Patient coming from: Home  Chief Complaint: Chest pain and shortness of breath after EtOH binge and cocaine use  HPI: Ricardo Burch is a 56 y.o. gentleman with a history of COPD on 2L [place] at baseline, diastolic heart failure, OSA on CPAP, and depression who admits to a alcohol binge and cocaine use on the day prior to presentation.  By this afternoon, he developed increased wheezing, progressive shortness of breath, and profound.  He turned his oxygen up to 5L, but EMS personnel still noted O2 sats in the 60s upon initial assessment.  He had a single episode of substernal chest pain.  He had dizziness but no syncope or LOC.  No nausea or vomiting.  He has had cough without sputum production.  He has had increased leg swelling despite taking his home dose of lasix.  ED Course: EKG shows sinus tachycardia with chronic RBBB.  Chest xray negative for acute process.  WBC count 11.3.  BNP 905 (chronically elevated; actually better than recent levels).  The patient has received prednisone 60mg  PO x one.  His second one-hour neb treatment is in progress.  Wheezing has improved but he still has an increased oxygen requirement.  Hospitalist asked to admit.  Review of Systems: As per HPI otherwise 10 point review of systems negative.    Past Medical History:  Diagnosis Date  . CHF (congestive heart failure) ([place])   . Complication of anesthesia    never been put to sleep  . COPD (chronic obstructive pulmonary disease) ([place]) Dx 2015  . Eczema    since childhood   . Sleep apnea    CPAP    Past Surgical History:  Procedure Laterality Date  . MULTIPLE EXTRACTIONS WITH ALVEOLOPLASTY N/A 03/22/2016   Procedure: MULTIPLE EXTRACTION WITH ALVEOLOPLASTY;  Surgeon: Diona Browner, DDS;  Location: [place];  Service: Oral Surgery;  Laterality: N/A;  . NO PAST SURGERIES       reports that he has been  smoking Cigarettes.  He has a 15.00 pack-year smoking history. He has never used smokeless tobacco. He reports that he drinks about 0.6 oz of alcohol per week . He reports that he uses drugs, including Marijuana and Cocaine.  He denies IV drug use.  Allergies  Allergen Reactions  . Apresoline [Hydralazine] Other (See Comments)    Headache   . Hydrocodone Nausea And Vomiting    Family History  Problem Relation Age of Onset  . Cerebral aneurysm Father   . Cancer Neg Hx   . Diabetes Neg Hx   . Heart disease Neg Hx      Prior to Admission medications   Medication Sig Start Date End Date Taking? Authorizing Provider  albuterol (PROVENTIL HFA;VENTOLIN HFA) 108 (90 Base) MCG/ACT inhaler Inhale 2 puffs into the lungs every 6 (six) hours as needed for wheezing or shortness of breath. 06/29/16  Yes Robbie Lis, MD  aspirin EC 81 MG tablet Take 1 tablet (81 mg total) by mouth daily. 09/20/15  Yes Florencia Reasons, MD  budesonide-formoterol [facility]) 80-4.5 MCG/ACT inhaler Inhale 2 puffs into the lungs 2 (two) times daily. 05/21/16  Yes Brand Males, MD  diphenhydramine-acetaminophen (TYLENOL PM) 25-500 MG TABS tablet Take 1 tablet by mouth at bedtime as needed.   Yes Historical Provider, MD  furosemide (LASIX) 20 MG tablet Take 1 tablet (20 mg total) by mouth 2 (two) times  daily. OV needed for additional refills 07/23/16  Yes Tiffany Daneil Dan, PA-C  guaiFENesin (MUCINEX) 600 MG 12 hr tablet Take 2 tablets (1,200 mg total) by mouth 2 (two) times daily. 09/20/15  Yes Florencia Reasons, MD  OXYGEN Inhale 2 L into the lungs continuous.   Yes Historical Provider, MD  potassium chloride (K-DUR) 10 MEQ tablet Take 1 tablet (10 mEq total) by mouth 2 (two) times daily. 07/23/16   Tiffany Daneil Dan, PA-C  tiotropium (SPIRIVA) 18 MCG inhalation capsule Place 1 capsule (18 mcg total) into inhaler and inhale daily. Patient not taking: Reported on 07/26/2016 05/10/16   Boykin Nearing, MD    Physical Exam: Vitals:   07/26/16 1941  07/26/16 2105 07/26/16 2245  BP: (!) 167/105  141/88  Pulse: 109 100 113  Resp:  18 21  Temp: 98.7 F (37.1 C)    TempSrc: Other (Comment)    SpO2: (!) 89% 93% 98%  Weight: 113.4 kg (250 lb)    Height: 5\' 7"  (1.702 m)        Constitutional: NAD, ill appearing Vitals:   07/26/16 1941 07/26/16 2105 07/26/16 2245  BP: (!) 167/105  141/88  Pulse: 109 100 113  Resp:  18 21  Temp: 98.7 F (37.1 C)    TempSrc: Other (Comment)    SpO2: (!) 89% 93% 98%  Weight: 113.4 kg (250 lb)    Height: 5\' 7"  (1.702 m)     Eyes: PERRL, lids and conjunctivae normal ENMT: Mucous membranes are moist. Posterior pharynx clear of any exudate or lesions. Normal dentition.  Neck: normal appearance, supple Respiratory: diminished bilaterally but no significant wheeze for me.  No crackles. No accessory muscle use.  Cardiovascular: Tachycardic but regular.  No murmurs / rubs / gallops. 1-2+ pitting edema in bilateral lower extremities.  2+ pedal pulses. GI: abdomen is soft and compressible but mildly distended.  No tenderness.  No masses palpated.  Bowel sounds are present. Musculoskeletal:  No joint deformity in upper and lower extremities. Good ROM, no contractures. Normal muscle tone.  Skin: no rashes, warm and dry Neurologic: Nonfocal. Psychiatric: Normal judgment and insight. Alert and oriented x 3. Normal mood.     Labs on Admission: I have personally reviewed following labs and imaging studies  CBC:  Recent Labs Lab 07/26/16 1935 07/26/16 1944  WBC 11.3*  --   HGB 16.5 18.7*  HCT 51.6 55.0*  MCV 105.3*  --   PLT 170  --    Basic Metabolic Panel:  Recent Labs Lab 07/26/16 1944  NA 132*  K 4.2  CL 81*  GLUCOSE 114*  BUN 15  CREATININE 0.80   GFR: Estimated Creatinine Clearance: 129.9 mL/min (by C-G formula based on SCr of 0.8 mg/dL).   Radiological Exams on Admission: Dg Chest Port 1 View  Result Date: 07/26/2016 CLINICAL DATA:  55 y/o M; shortness of breath. History of  COPD and congestive heart failure. EXAM: PORTABLE CHEST 1 VIEW COMPARISON:  07/13/2016 chest radiograph. FINDINGS: Stable cardiomegaly with enlarged central pulmonary arteries and right ventricular configuration. Linear opacity in right mid lung zone is unchanged and probably represent scarring. No focal consolidation, pleural effusion, or pneumothorax is identified. Chronic left-sided rib fractures. No acute osseous abnormality is evident. IMPRESSION: Stable cardiomegaly.  No active disease. Electronically Signed   By: Kristine Garbe M.D.   On: 07/26/2016 19:59    EKG: Independently reviewed. Sinus tachycardia, RBBB (not new).  Assessment/Plan Principal Problem:   COPD exacerbation (HCC) Active Problems:  Tobacco abuse   Diastolic CHF ([place])   Alcohol abuse   Cocaine abuse   Depression   OSA on CPAP      Acute hypoxic respiratory failure secondary to COPD exacerbation --IV solumedrol 60mg  q6h --Continue Symbicort (or formulary equivalent) --Duonebs q6h --wean oxygen as tolerated, continuous pulse oximetry --CPAP qHS --RT eval and treat --Incentive spirometry  Acute exacerbation of chronic diastolic heart failure --Gentle diuresis with IV lasix 40mg  daily --Strict I/O --daily weights  Polysubstance abuse --UDS  Sinus tachycardia --I suspect is secondary to albuterol, monitor on telemetry  Constipation --Bowel regimen per patient request  DVT prophylaxis: Lovenox Code Status: FULL Family Communication: Patient alone in the ED at time of admission Disposition Plan: Expect he will go home when ready Consults called: NONE Admission status: Inpatient telemetry.  Based on the degree of hypoxia present on admission and his increased oxygen requirement, I expect the patient will be here at least two midnights for therapy for COPD as well as diuresis (he has a history of CHF with increased LE edema on exam).   TIME SPENT: 70 minutes   Eber Jones  MD [facility] Pager [phone]  If 7PM-7AM, please contact night-coverage [email or web address] Password [email or web address]  07/26/2016, 11:13 PM

## 2016-07-26 NOTE — ED Notes (Signed)
Pt called out and stated "I think my oxygen is low." SpO2 on earlobe not reading, RN changed SpO2 to finger, O2 read 45%. Pt c/o feeling "woozy."  Pt was receiving a neb treatment on medical air. This was changed to oxygen and turned to 15L. Pt o2 began to respond up to 96%. MD made aware of patient situation and at bedside.

## 2016-07-26 NOTE — ED Notes (Signed)
Pt given cup of ice at this time

## 2016-07-26 NOTE — ED Notes (Signed)
Eulas Post, MD hospitalist at bedside

## 2016-07-26 NOTE — Telephone Encounter (Signed)
Pt returned call  He would like to be evaluated for POC - order sent for POC evaluation Also, pt is asking for [facility] to tell him what size his home concentrator is > staff message sent to [facility] with this question Nothing further needed; will sign off

## 2016-07-26 NOTE — ED Provider Notes (Signed)
[facility] DEPT Provider Note   CSN: [id number] Arrival date & time: 07/26/16  1926     History   Chief Complaint Chief Complaint  Patient presents with  . Chest Pain    HPI Ricardo Burch is a 55 y.o. male.  The history is provided by the patient.  Chest Pain   This is a recurrent problem. The current episode started more than 1 week ago. The problem occurs constantly. The problem has been resolved. The pain is associated with rest. The pain is present in the lateral region. The pain is moderate. The quality of the pain is described as pressure-like. The pain does not radiate. Associated symptoms include lower extremity edema and shortness of breath. Pertinent negatives include no abdominal pain, no back pain, no fever, no orthopnea, no palpitations and no vomiting. He has tried oxygen (duonebs in route by ems) for the symptoms. The treatment provided significant relief. Risk factors include smoking/tobacco exposure, substance abuse and male gender.  Pertinent negatives for past medical history include no seizures.  Shortness of Breath  Associated symptoms include chest pain. Pertinent negatives include no fever, no sore throat, no ear pain, no orthopnea, no vomiting, no abdominal pain and no rash.    Past Medical History:  Diagnosis Date  . CHF (congestive heart failure) ([place])   . Complication of anesthesia    never been put to sleep  . COPD (chronic obstructive pulmonary disease) ([place]) Dx 2015  . Eczema    since childhood   . Sleep apnea    CPAP    Patient Active Problem List   Diagnosis Date Noted  . CHF (congestive heart failure) ([place]) 06/27/2016  . Alcohol abuse 06/27/2016  . Acute diastolic (congestive) heart failure ([place]) 06/27/2016  . ARF (acute renal failure) ([place])   . Acute respiratory failure ([place]) 09/16/2015  . Elevated BP 08/24/2015  . COPD exacerbation ([place]) 08/24/2015  . Left elbow contusion 05/26/2015  . Healthcare maintenance 05/26/2015  . COPD  (chronic obstructive pulmonary disease) ([place]) 08/23/2014  . Diastolic CHF ([place]) [id number]  . Eczema 08/23/2014  . Right knee pain 08/23/2014  . Tobacco abuse 03/27/2014    Past Surgical History:  Procedure Laterality Date  . MULTIPLE EXTRACTIONS WITH ALVEOLOPLASTY N/A 03/22/2016   Procedure: MULTIPLE EXTRACTION WITH ALVEOLOPLASTY;  Surgeon: Diona Browner, DDS;  Location: [facility];  Service: Oral Surgery;  Laterality: N/A;  . NO PAST SURGERIES         Home Medications    Prior to Admission medications   Medication Sig Start Date End Date Taking? Authorizing Provider  albuterol (PROVENTIL HFA;VENTOLIN HFA) 108 (90 Base) MCG/ACT inhaler Inhale 2 puffs into the lungs every 6 (six) hours as needed for wheezing or shortness of breath. 06/29/16   Robbie Lis, MD  aspirin EC 81 MG tablet Take 1 tablet (81 mg total) by mouth daily. 09/20/15   Florencia Reasons, MD  budesonide-formoterol (SYMBICORT) 80-4.5 MCG/ACT inhaler Inhale 2 puffs into the lungs 2 (two) times daily. 05/21/16   Brand Males, MD  furosemide (LASIX) 20 MG tablet Take 1 tablet (20 mg total) by mouth 2 (two) times daily. OV needed for additional refills 07/23/16   Brayton Caves, PA-C  guaiFENesin (MUCINEX) 600 MG 12 hr tablet Take 2 tablets (1,200 mg total) by mouth 2 (two) times daily. 09/20/15   Florencia Reasons, MD  hydrALAZINE (APRESOLINE) 10 MG tablet Take 1 tablet (10 mg total) by mouth 3 (three) times daily. 06/29/16   Robbie Lis,  MD  OXYGEN Inhale 2 L into the lungs continuous.    Historical Provider, MD  polyethylene glycol powder (GLYCOLAX/MIRALAX) powder Take 17 g by mouth 2 (two) times daily as needed. 07/23/16   Tiffany Daneil Dan, PA-C  potassium chloride (K-DUR) 10 MEQ tablet Take 1 tablet (10 mEq total) by mouth 2 (two) times daily. 07/23/16   Tiffany Daneil Dan, PA-C  tiotropium (SPIRIVA) 18 MCG inhalation capsule Place 1 capsule (18 mcg total) into inhaler and inhale daily. Patient not taking: Reported on 07/24/2016 05/10/16   Boykin Nearing, MD    Family History Family History  Problem Relation Age of Onset  . Cerebral aneurysm Father   . Cancer Neg Hx   . Diabetes Neg Hx   . Heart disease Neg Hx     Social History Social History  Substance Use Topics  . Smoking status: Current Every Day Smoker    Packs/day: 0.50    Years: 30.00    Types: Cigarettes  . Smokeless tobacco: Never Used  . Alcohol use 0.6 oz/week    1 Cans of beer per week     Comment: occassionally     Allergies   Apresoline [hydralazine] and Hydrocodone   Review of Systems Review of Systems  Constitutional: Negative for chills and fever.  HENT: Negative for ear pain and sore throat.   Eyes: Negative for pain and visual disturbance.  Respiratory: Positive for chest tightness and shortness of breath.   Cardiovascular: Positive for chest pain. Negative for palpitations and orthopnea.  Gastrointestinal: Negative for abdominal pain and vomiting.  Genitourinary: Negative for dysuria and hematuria.  Musculoskeletal: Negative for arthralgias and back pain.  Skin: Negative for color change and rash.  Neurological: Negative for seizures and syncope.  Psychiatric/Behavioral: Positive for dysphoric mood.  All other systems reviewed and are negative.    Physical Exam Updated Vital Signs BP 141/88   Pulse 113   Temp 98.7 F (37.1 C) (Other (Comment))   Resp 21   Ht 5\' 7"  (1.702 m)   Wt 113.4 kg   SpO2 98%   BMI 39.16 kg/m   Physical Exam  Constitutional: He is oriented to person, place, and time. He appears well-developed and well-nourished.  HENT:  Head: Normocephalic and atraumatic.  Eyes: Conjunctivae are normal. Pupils are equal, round, and reactive to light.  Neck: Neck supple.  Cardiovascular: Normal rate and regular rhythm.   No murmur heard. Pulmonary/Chest: Effort normal and breath sounds normal. No respiratory distress. He has no wheezes. He exhibits no tenderness.  Abdominal: Soft. There is no tenderness.    Musculoskeletal: He exhibits edema (bilateral lower leg). He exhibits no tenderness.  Neurological: He is alert and oriented to person, place, and time.  Skin: Skin is warm and dry.  Nursing note and vitals reviewed.    ED Treatments / Results  Labs (all labs ordered are listed, but only abnormal results are displayed) Labs Reviewed  CBC - Abnormal; Notable for the following:       Result Value   WBC 11.3 (*)    MCV 105.3 (*)    All other components within normal limits  BRAIN NATRIURETIC PEPTIDE - Abnormal; Notable for the following:    B Natriuretic Peptide 905.4 (*)    All other components within normal limits  URINE RAPID DRUG SCREEN, HOSP PERFORMED - Abnormal; Notable for the following:    Cocaine POSITIVE (*)    All other components within normal limits  I-STAT CHEM 8, ED -  Abnormal; Notable for the following:    Sodium 132 (*)    Chloride 81 (*)    Glucose, Bld 114 (*)    Calcium, Ion 1.01 (*)    Hemoglobin 18.7 (*)    HCT 55.0 (*)    All other components within normal limits  URINALYSIS, ROUTINE W REFLEX MICROSCOPIC (NOT AT [facility])  I-STAT TROPOININ, ED  Randolm Idol, ED    EKG  EKG Interpretation  Date/Time:  Friday July 26 2016 19:31:22 EDT Ventricular Rate:  107 PR Interval:    QRS Duration: 168 QT Interval:  365 QTC Calculation: 487 R Axis:   163 Text Interpretation:  Sinus tachycardia Biatrial enlargement Right bundle branch block No significant change since last tracing Confirmed by KNAPP  MD-J, JON [id number]) on 07/26/2016 7:42:10 PM       Radiology Dg Chest Port 1 View  Result Date: 07/26/2016 CLINICAL DATA:  55 y/o M; shortness of breath. History of COPD and congestive heart failure. EXAM: PORTABLE CHEST 1 VIEW COMPARISON:  07/13/2016 chest radiograph. FINDINGS: Stable cardiomegaly with enlarged central pulmonary arteries and right ventricular configuration. Linear opacity in right mid lung zone is unchanged and probably represent scarring.  No focal consolidation, pleural effusion, or pneumothorax is identified. Chronic left-sided rib fractures. No acute osseous abnormality is evident. IMPRESSION: Stable cardiomegaly.  No active disease. Electronically Signed   By: Kristine Garbe M.D.   On: 07/26/2016 19:59    Procedures Procedures (including critical care time)  Medications Ordered in ED Medications  methylPREDNISolone sodium succinate (SOLU-MEDROL) 125 mg/2 mL injection 60 mg (60 mg Intravenous Given 07/26/16 2318)  albuterol (PROVENTIL,VENTOLIN) solution continuous neb (10 mg/hr Nebulization Given 07/26/16 2105)  ipratropium (ATROVENT) nebulizer solution 0.5 mg (0.5 mg Nebulization Given 07/26/16 2105)  albuterol (PROVENTIL,VENTOLIN) solution continuous neb (10 mg/hr Nebulization Given 07/26/16 2245)     Initial Impression / Assessment and Plan / ED Course  I have reviewed the triage vital signs and the nursing notes.  Pertinent labs & imaging results that were available during my care of the patient were reviewed by me and considered in my medical decision making (see chart for details).  Clinical Course  Comment By Time  Pt seen and examined.  Complaining of shortness of breath.  Hx of COPD and CHF.  Some wheezes on exam.  Lower extrem. peripheral edema.  Plan on cardiac workup, alb treatment Dorie Rank, MD 09/22 2033    Patient has a history of COPD and CHF.  He presents for acute shortness of breath in context of recent binge of EtOH and cocaine.  Patient uses 2L o2 at home which he increased to 5L without improvement.  EMS was called to house and found him saturating at 60%.  They gave aspirin and nebulizer treatments with improvement in symptoms.  Complains of chest pain, described primarily as tightness.  Delta troponins negative. CXR ordered, demonstrates no acute pulmonary findings.  WBC elevated. BNP near apparent baseline.  Most likely, patient is suffering from COPD exacerbation, can not exclude CHF  component.  Approximately 2 hours after initial nebulizer treatments, patient desaturated to the 50's while on room air.  Patient placed back on oxygen and saturation improved. Additional nebulizer treatment started.  Patient admitted to hospitalist.   Final Clinical Impressions(s) / ED Diagnoses   Final diagnoses:  COPD exacerbation [facility])    New Prescriptions New Prescriptions   No medications on file     Elveria Rising, MD 07/27/16 [id number]    Wille Glaser  Tomi Bamberger, MD 07/27/16 [id number]

## 2016-07-26 NOTE — ED Triage Notes (Signed)
Patient arrived via EMS.  Call was for SOB.  Upon fire arrival patient was on oxygen at 5l (his own) and sats were 66%.  EMS started duoneb and administered 4 81mg  ASA   Used Cocaine last night

## 2016-07-27 ENCOUNTER — Inpatient Hospital Stay (HOSPITAL_COMMUNITY): Payer: Medicaid Other

## 2016-07-27 ENCOUNTER — Encounter (HOSPITAL_COMMUNITY): Payer: Self-pay

## 2016-07-27 DIAGNOSIS — J9601 Acute respiratory failure with hypoxia: Secondary | ICD-10-CM

## 2016-07-27 DIAGNOSIS — F1410 Cocaine abuse, uncomplicated: Secondary | ICD-10-CM

## 2016-07-27 DIAGNOSIS — J441 Chronic obstructive pulmonary disease with (acute) exacerbation: Principal | ICD-10-CM

## 2016-07-27 DIAGNOSIS — I5031 Acute diastolic (congestive) heart failure: Secondary | ICD-10-CM

## 2016-07-27 DIAGNOSIS — J9602 Acute respiratory failure with hypercapnia: Secondary | ICD-10-CM

## 2016-07-27 DIAGNOSIS — Z720 Tobacco use: Secondary | ICD-10-CM

## 2016-07-27 LAB — BASIC METABOLIC PANEL
Anion gap: 11 (ref 5–15)
BUN: 10 mg/dL (ref 6–20)
CHLORIDE: 84 mmol/L — AB (ref 101–111)
CO2: 42 mmol/L — ABNORMAL HIGH (ref 22–32)
CREATININE: 0.92 mg/dL (ref 0.61–1.24)
Calcium: 8.6 mg/dL — ABNORMAL LOW (ref 8.9–10.3)
GFR calc Af Amer: >60 mL/min (ref >60)
GFR calc non Af Amer: >60 mL/min (ref >60)
GLUCOSE: 169 mg/dL — AB (ref 65–99)
POTASSIUM: 3.5 mmol/L (ref 3.5–5.1)
Sodium: 137 mmol/L (ref 135–145)

## 2016-07-27 LAB — URINALYSIS, ROUTINE W REFLEX MICROSCOPIC
Bilirubin Urine: NEGATIVE
GLUCOSE, UA: NEGATIVE mg/dL
KETONES UR: NEGATIVE mg/dL
LEUKOCYTES UA: NEGATIVE
Nitrite: NEGATIVE
PROTEIN: NEGATIVE mg/dL
Specific Gravity, Urine: 1.007 (ref 1.005–1.030)
pH: 7 (ref 5.0–8.0)

## 2016-07-27 LAB — BLOOD GAS, ARTERIAL
ACID-BASE EXCESS: 18.3 mmol/L — AB (ref 0.0–2.0)
ACID-BASE EXCESS: 17.7 mmol/L — AB (ref 0.0–2.0)
BICARBONATE: 47 mmol/L — AB (ref 20.0–28.0)
BICARBONATE: 44.3 mmol/L — AB (ref 20.0–28.0)
DRAWN BY: 406621
Drawn by: 10552
FIO2: 50
LHR: 20 {breaths}/min
MECHVT: 530 mL
O2 CONTENT: 2 L/min
O2 Saturation: 82.8 %
O2 Saturation: 92 %
PATIENT TEMPERATURE: 98.6
PCO2 ART: 0 mmHg — AB (ref 32.0–48.0)
PEEP/CPAP: 5 cmH2O
PH ART: 7.184 — AB (ref 7.350–7.450)
PO2 ART: 56.3 mmHg — AB (ref 83.0–108.0)
Patient temperature: 98.6
TCO2: 51 mmol/L (ref 0–100)
pCO2 arterial: 84.2 mmHg (ref 32.0–48.0)
pH, Arterial: 7.341 — ABNORMAL LOW (ref 7.350–7.450)
pO2, Arterial: 65.2 mmHg — ABNORMAL LOW (ref 83.0–108.0)

## 2016-07-27 LAB — GLUCOSE, CAPILLARY
GLUCOSE-CAPILLARY: 156 mg/dL — AB (ref 65–99)
GLUCOSE-CAPILLARY: 179 mg/dL — AB (ref 65–99)

## 2016-07-27 LAB — TROPONIN I: Troponin I: 0.03 ng/mL (ref <0.03)

## 2016-07-27 LAB — CBC
HCT: 52.5 % — ABNORMAL HIGH (ref 39.0–52.0)
Hemoglobin: 16.2 g/dL (ref 13.0–17.0)
MCH: 32.5 pg (ref 26.0–34.0)
MCHC: 30.9 g/dL (ref 30.0–36.0)
MCV: 105.2 fL — AB (ref 78.0–100.0)
PLATELETS: 186 10*3/uL (ref 150–400)
RBC: 4.99 MIL/uL (ref 4.22–5.81)
RDW: 14.4 % (ref 11.5–15.5)
WBC: 11.6 10*3/uL — ABNORMAL HIGH (ref 4.0–10.5)

## 2016-07-27 LAB — URINE MICROSCOPIC-ADD ON
BACTERIA UA: NONE SEEN
WBC, UA: NONE SEEN WBC/hpf (ref 0–5)

## 2016-07-27 LAB — MRSA PCR SCREENING: MRSA BY PCR: NEGATIVE

## 2016-07-27 MED ORDER — MIDAZOLAM HCL 2 MG/2ML IJ SOLN
2.0000 mg | INTRAMUSCULAR | Status: DC | PRN
  Administered 2016-07-27 – 2016-07-28 (×5): 2 mg via INTRAVENOUS
  Filled 2016-07-27 (×3): qty 2

## 2016-07-27 MED ORDER — CHLORHEXIDINE GLUCONATE 0.12% ORAL RINSE (MEDLINE KIT)
15.0000 mL | Freq: Two times a day (BID) | OROMUCOSAL | Status: DC
  Administered 2016-07-27 – 2016-07-28 (×2): 15 mL via OROMUCOSAL

## 2016-07-27 MED ORDER — DEXMEDETOMIDINE HCL IN NACL 400 MCG/100ML IV SOLN
0.4000 ug/kg/h | INTRAVENOUS | Status: DC
  Filled 2016-07-27: qty 100

## 2016-07-27 MED ORDER — VITAMIN B-1 100 MG PO TABS
100.0000 mg | ORAL_TABLET | Freq: Every day | ORAL | Status: DC
  Administered 2016-07-27: 100 mg via ORAL
  Filled 2016-07-27 (×2): qty 1

## 2016-07-27 MED ORDER — POTASSIUM CHLORIDE 20 MEQ/15ML (10%) PO SOLN
20.0000 meq | Freq: Once | ORAL | Status: AC
Start: 2016-07-27 — End: 2016-07-27
  Administered 2016-07-27: 20 meq via ORAL
  Filled 2016-07-27: qty 15

## 2016-07-27 MED ORDER — ASPIRIN EC 81 MG PO TBEC
81.0000 mg | DELAYED_RELEASE_TABLET | Freq: Every day | ORAL | Status: DC
  Administered 2016-07-27 – 2016-07-30 (×3): 81 mg via ORAL
  Filled 2016-07-27 (×4): qty 1

## 2016-07-27 MED ORDER — MIDAZOLAM HCL 2 MG/2ML IJ SOLN
2.0000 mg | INTRAMUSCULAR | Status: DC | PRN
  Administered 2016-07-27 (×2): 2 mg via INTRAVENOUS
  Filled 2016-07-27 (×4): qty 2

## 2016-07-27 MED ORDER — THIAMINE HCL 100 MG/ML IJ SOLN
100.0000 mg | Freq: Every day | INTRAMUSCULAR | Status: DC
  Administered 2016-07-28: 100 mg via INTRAVENOUS
  Filled 2016-07-27 (×2): qty 2

## 2016-07-27 MED ORDER — LEVOFLOXACIN IN D5W 500 MG/100ML IV SOLN
500.0000 mg | INTRAVENOUS | Status: DC
  Administered 2016-07-27 – 2016-07-28 (×2): 500 mg via INTRAVENOUS
  Filled 2016-07-27 (×2): qty 100

## 2016-07-27 MED ORDER — FUROSEMIDE 10 MG/ML IJ SOLN
40.0000 mg | Freq: Every day | INTRAMUSCULAR | Status: DC
  Administered 2016-07-27: 40 mg via INTRAVENOUS
  Filled 2016-07-27: qty 4

## 2016-07-27 MED ORDER — ACETAMINOPHEN 650 MG RE SUPP: 650.0000 mg | Freq: Four times a day (QID) | RECTAL | Status: DC | PRN

## 2016-07-27 MED ORDER — LORAZEPAM 2 MG/ML IJ SOLN: 0.0000 mg | Freq: Four times a day (QID) | INTRAMUSCULAR | Status: DC

## 2016-07-27 MED ORDER — ORAL CARE MOUTH RINSE
15.0000 mL | Freq: Four times a day (QID) | OROMUCOSAL | Status: DC
  Administered 2016-07-27 (×2): 15 mL via OROMUCOSAL

## 2016-07-27 MED ORDER — FENTANYL CITRATE (PF) 100 MCG/2ML IJ SOLN
100.0000 ug | Freq: Once | INTRAMUSCULAR | Status: AC
  Administered 2016-07-27: 100 ug via INTRAVENOUS

## 2016-07-27 MED ORDER — CHLORHEXIDINE GLUCONATE 0.12% ORAL RINSE (MEDLINE KIT)
15.0000 mL | Freq: Two times a day (BID) | OROMUCOSAL | Status: DC
  Administered 2016-07-27: 15 mL via OROMUCOSAL

## 2016-07-27 MED ORDER — ACETAMINOPHEN 325 MG PO TABS: 650.0000 mg | ORAL_TABLET | Freq: Four times a day (QID) | ORAL | Status: DC | PRN

## 2016-07-27 MED ORDER — DOCUSATE SODIUM 100 MG PO CAPS
100.0000 mg | ORAL_CAPSULE | Freq: Two times a day (BID) | ORAL | Status: DC
  Administered 2016-07-27 – 2016-07-30 (×4): 100 mg via ORAL
  Filled 2016-07-27 (×8): qty 1

## 2016-07-27 MED ORDER — ORAL CARE MOUTH RINSE
15.0000 mL | Freq: Four times a day (QID) | OROMUCOSAL | Status: DC
  Administered 2016-07-28 (×2): 15 mL via OROMUCOSAL

## 2016-07-27 MED ORDER — ROCURONIUM BROMIDE 50 MG/5ML IV SOLN
100.0000 mg | Freq: Once | INTRAVENOUS | Status: AC
  Administered 2016-07-27: 100 mg via INTRAVENOUS

## 2016-07-27 MED ORDER — MIDAZOLAM HCL 2 MG/2ML IJ SOLN
INTRAMUSCULAR | Status: AC
  Filled 2016-07-27: qty 2

## 2016-07-27 MED ORDER — LORAZEPAM 1 MG PO TABS: 1.0000 mg | ORAL_TABLET | Freq: Four times a day (QID) | ORAL | Status: DC | PRN

## 2016-07-27 MED ORDER — ARFORMOTEROL TARTRATE 15 MCG/2ML IN NEBU
15.0000 ug | INHALATION_SOLUTION | Freq: Two times a day (BID) | RESPIRATORY_TRACT | Status: DC
  Administered 2016-07-27 – 2016-07-30 (×7): 15 ug via RESPIRATORY_TRACT
  Filled 2016-07-27 (×7): qty 2

## 2016-07-27 MED ORDER — ENOXAPARIN SODIUM 40 MG/0.4ML ~~LOC~~ SOLN
40.0000 mg | SUBCUTANEOUS | Status: DC
  Administered 2016-07-27 – 2016-07-30 (×4): 40 mg via SUBCUTANEOUS
  Filled 2016-07-27 (×4): qty 0.4

## 2016-07-27 MED ORDER — FENTANYL CITRATE (PF) 100 MCG/2ML IJ SOLN
INTRAMUSCULAR | Status: AC
  Filled 2016-07-27: qty 2

## 2016-07-27 MED ORDER — MOMETASONE FURO-FORMOTEROL FUM 100-5 MCG/ACT IN AERO
2.0000 | INHALATION_SPRAY | Freq: Two times a day (BID) | RESPIRATORY_TRACT | Status: DC
  Filled 2016-07-27: qty 8.8

## 2016-07-27 MED ORDER — METHYLPREDNISOLONE SODIUM SUCC 125 MG IJ SOLR
60.0000 mg | Freq: Two times a day (BID) | INTRAMUSCULAR | Status: DC
  Administered 2016-07-27 – 2016-07-28 (×2): 60 mg via INTRAVENOUS
  Filled 2016-07-27 (×2): qty 0.96

## 2016-07-27 MED ORDER — FENTANYL CITRATE (PF) 100 MCG/2ML IJ SOLN
100.0000 ug | INTRAMUSCULAR | Status: DC | PRN
  Administered 2016-07-27: 50 ug via INTRAVENOUS
  Administered 2016-07-27 – 2016-07-28 (×5): 100 ug via INTRAVENOUS
  Filled 2016-07-27 (×6): qty 2

## 2016-07-27 MED ORDER — ETOMIDATE 2 MG/ML IV SOLN
20.0000 mg | Freq: Once | INTRAVENOUS | Status: AC
Start: 2016-07-27 — End: 2016-07-27
  Administered 2016-07-27: 20 mg via INTRAVENOUS

## 2016-07-27 MED ORDER — POTASSIUM CHLORIDE CRYS ER 10 MEQ PO TBCR
10.0000 meq | EXTENDED_RELEASE_TABLET | Freq: Two times a day (BID) | ORAL | Status: DC
  Administered 2016-07-27: 10 meq via ORAL
  Filled 2016-07-27 (×2): qty 1

## 2016-07-27 MED ORDER — HYDRALAZINE HCL 10 MG PO TABS
10.0000 mg | ORAL_TABLET | Freq: Three times a day (TID) | ORAL | Status: DC
  Administered 2016-07-28 – 2016-07-30 (×7): 10 mg via ORAL
  Filled 2016-07-27 (×10): qty 1

## 2016-07-27 MED ORDER — GUAIFENESIN ER 600 MG PO TB12
1200.0000 mg | ORAL_TABLET | Freq: Two times a day (BID) | ORAL | Status: DC
  Administered 2016-07-28 – 2016-07-30 (×4): 1200 mg via ORAL
  Filled 2016-07-27 (×7): qty 2

## 2016-07-27 MED ORDER — DOCUSATE SODIUM 50 MG/5ML PO LIQD: 100.0000 mg | Freq: Two times a day (BID) | ORAL | Status: DC | PRN

## 2016-07-27 MED ORDER — FUROSEMIDE 10 MG/ML IJ SOLN
40.0000 mg | Freq: Four times a day (QID) | INTRAMUSCULAR | Status: AC
  Administered 2016-07-27 (×2): 40 mg via INTRAVENOUS
  Filled 2016-07-27 (×3): qty 4

## 2016-07-27 MED ORDER — FENTANYL CITRATE (PF) 100 MCG/2ML IJ SOLN
100.0000 ug | INTRAMUSCULAR | Status: DC | PRN
  Administered 2016-07-27 (×2): 100 ug via INTRAVENOUS
  Filled 2016-07-27 (×2): qty 2

## 2016-07-27 MED ORDER — MIDAZOLAM HCL 2 MG/2ML IJ SOLN
2.0000 mg | Freq: Once | INTRAMUSCULAR | Status: AC
  Administered 2016-07-27: 2 mg via INTRAVENOUS

## 2016-07-27 MED ORDER — ADULT MULTIVITAMIN W/MINERALS CH
1.0000 | ORAL_TABLET | Freq: Every day | ORAL | Status: DC
  Administered 2016-07-27: 1 via ORAL
  Filled 2016-07-27 (×2): qty 1

## 2016-07-27 MED ORDER — LORAZEPAM 2 MG/ML IJ SOLN
1.0000 mg | Freq: Four times a day (QID) | INTRAMUSCULAR | Status: DC | PRN
  Administered 2016-07-27: 1 mg via INTRAVENOUS
  Filled 2016-07-27: qty 1

## 2016-07-27 MED ORDER — SENNA 8.6 MG PO TABS
1.0000 | ORAL_TABLET | Freq: Two times a day (BID) | ORAL | Status: DC
  Administered 2016-07-27 – 2016-07-30 (×5): 8.6 mg via ORAL
  Filled 2016-07-27 (×8): qty 1

## 2016-07-27 MED ORDER — FAMOTIDINE IN NACL 20-0.9 MG/50ML-% IV SOLN
20.0000 mg | Freq: Two times a day (BID) | INTRAVENOUS | Status: DC
  Administered 2016-07-27 (×2): 20 mg via INTRAVENOUS
  Filled 2016-07-27 (×5): qty 50

## 2016-07-27 MED ORDER — FOLIC ACID 1 MG PO TABS
1.0000 mg | ORAL_TABLET | Freq: Every day | ORAL | Status: DC
  Administered 2016-07-27: 1 mg via ORAL
  Filled 2016-07-27 (×2): qty 1

## 2016-07-27 MED ORDER — BUDESONIDE 0.5 MG/2ML IN SUSP
0.5000 mg | Freq: Two times a day (BID) | RESPIRATORY_TRACT | Status: DC
Start: 2016-07-27 — End: 2016-07-30
  Administered 2016-07-27 – 2016-07-30 (×6): 0.5 mg via RESPIRATORY_TRACT
  Filled 2016-07-27 (×7): qty 2

## 2016-07-27 MED ORDER — SODIUM CHLORIDE 0.9% FLUSH
3.0000 mL | Freq: Two times a day (BID) | INTRAVENOUS | Status: DC
  Administered 2016-07-27 – 2016-07-30 (×7): 3 mL via INTRAVENOUS

## 2016-07-27 MED ORDER — IPRATROPIUM-ALBUTEROL 0.5-2.5 (3) MG/3ML IN SOLN
3.0000 mL | Freq: Four times a day (QID) | RESPIRATORY_TRACT | Status: DC
  Administered 2016-07-27 – 2016-07-30 (×12): 3 mL via RESPIRATORY_TRACT
  Filled 2016-07-27 (×12): qty 3

## 2016-07-27 MED ORDER — LORAZEPAM 2 MG/ML IJ SOLN: 0.0000 mg | Freq: Two times a day (BID) | INTRAMUSCULAR | Status: DC

## 2016-07-27 NOTE — Significant Event (Signed)
Rapid Response Event Note  Overview:  STAT RRT page and call from operator Time Called: [place] Time: 0741 Event Type: Respiratory  Initial Focused Assessment:  STAT RRT page and call from operator for RRT assistance.  Upon my arrival to patients room, RN at bedside.  Patient lying supine in bed with nasal cannula at 6LPM.  Patient is unresponsive to painful stimuli, skin is warm and dry, pupils reactive, sluggish.  Pedal edema noted b/l.  Breath sounds diminished with minimal air movement.  AS per RN, Patient new admit from ED during the night, order for CPAP at night, patient not on CPAP machine, and patient became agitated at 0500 and was given ativan.  On morning rounds with day RN, patient noted to be unresponsive.    Interventions:  ABG done as per protocol,  Dr. Renne Crigler at bedside, results given to MD--7.18, CO2 unreadable, O2 56, Sat 82%.  BIPAP ordered.  EKG done and given to MD. RT placing patient on BIPAP.  Plan of Care (if not transferred): Patient to transfer   Event Summary:  Patient transferred via bed, with BIPAP and monitor to 2 M 7   at      at          [facility], Harlin Rain

## 2016-07-27 NOTE — Progress Notes (Addendum)
PROGRESS NOTE  Ricardo Burch [id number] DOB: 1961/07/10 DOA: 07/26/2016 PCP: [name], MD   LOS: 1 day   Brief Narrative: 55 y.o. gentleman with a history of COPD on 2L [place] at baseline, diastolic heart failure, OSA on CPAP, and depression who admits to a alcohol binge and cocaine use on the day prior to presentation, admitted on 9/22 with COPD exacerbation  Assessment & Plan: Principal Problem:   COPD exacerbation ([place]) Active Problems:   Tobacco abuse   Diastolic CHF ([place])   Alcohol abuse   Cocaine abuse   Depression   OSA on CPAP   Acute encephalopathy - patient found unresponsive this morning, ABG with significant hypercarbia, pH 7.19 - not been on CPAP overnight and received ativan - BiPAP, transfer to SDU, however does not move much air on BiPAP, consulted critical care  Acute on chronic hypoxic and hypercarbic respiratory failure - as above, BiPAP, PCCM to see, may need to be intubated  Acute on chronic diastolic CHF - troponin this morning, normal overnight - EKG unchanged - echo last month with normal EF - got lasix last night, CXR this morning pending  Polysubstance abuse - including cocaine, check troponin  Alcohol abuse - for now d/c Ativan   DVT prophylaxis: Lovenox Code Status: Full Family Communication: no family bedside Disposition Plan: transfer to SDU  Consultants:   none  Procedures:   BiPAP: 9/23  Antimicrobials:  levaquin 9/23 >>   Subjective: unresponsive  Objective: Vitals:   07/27/16 0002 07/27/16 0230 07/27/16 0400 07/27/16 0526  BP:    101/60  Pulse:  (!) 121 (!) 102 (!) 106  Resp:    18  Temp:    98.5 F (36.9 C)  TempSrc:    Oral  SpO2:  91%  92%  Weight: 109.4 kg (241 lb 2.9 oz)     Height: 5\' 7"  (1.702 m)       Intake/Output Summary (Last 24 hours) at 07/27/16 0812 Last data filed at 07/27/16 0600  Gross per 24 hour  Intake              240 ml  Output             1600 ml  Net            -1360 ml     Filed Weights   07/26/16 1941 07/27/16 0002  Weight: 113.4 kg (250 lb) 109.4 kg (241 lb 2.9 oz)    Examination: Constitutional: unresponsive Vitals:   07/27/16 0002 07/27/16 0230 07/27/16 0400 07/27/16 0526  BP:    101/60  Pulse:  (!) 121 (!) 102 (!) 106  Resp:    18  Temp:    98.5 F (36.9 C)  TempSrc:    Oral  SpO2:  91%  92%  Weight: 109.4 kg (241 lb 2.9 oz)     Height: 5\' 7"  (1.702 m)      Eyes: PERRL, lids and conjunctivae normal ENMT: Mucous membranes are moist. Respiratory: shallow breathing, decreased breath sounds, no wheezing Cardiovascular: Regular rate and rhythm, no murmurs / rubs / gallops. 1-2+ LE edema. 2+ pedal pulses.  Abdomen: no tenderness. Bowel sounds positive.  Musculoskeletal: no clubbing / cyanosis. Skin: no rashes, lesions, ulcers.  Neurologic: unresponsive   Data Reviewed: I have personally reviewed following labs and imaging studies  CBC:  Recent Labs Lab 07/26/16 1935 07/26/16 1944 07/27/16 0212  WBC 11.3*  --  11.6*  HGB 16.5 18.7* 16.2  HCT 51.6 55.0*  52.5*  MCV 105.3*  --  105.2*  PLT 170  --  99991111   Basic Metabolic Panel:  Recent Labs Lab 07/26/16 1944 07/27/16 0212  NA 132* 137  K 4.2 3.5  CL 81* 84*  CO2  --  42*  GLUCOSE 114* 169*  BUN 15 10  CREATININE 0.80 0.92  CALCIUM  --  8.6*   GFR: Estimated Creatinine Clearance: 110.8 mL/min (by C-G formula based on SCr of 0.92 mg/dL). Liver Function Tests: No results for input(s): AST, ALT, ALKPHOS, BILITOT, PROT, ALBUMIN in the last 168 hours. No results for input(s): LIPASE, AMYLASE in the last 168 hours. No results for input(s): AMMONIA in the last 168 hours. Coagulation Profile: No results for input(s): INR, PROTIME in the last 168 hours. Cardiac Enzymes: No results for input(s): CKTOTAL, CKMB, CKMBINDEX, TROPONINI in the last 168 hours. BNP (last 3 results) No results for input(s): PROBNP in the last 8760 hours. HbA1C: No results for input(s): HGBA1C in the  last 72 hours. CBG:  Recent Labs Lab 07/27/16 0738  GLUCAP 156*   Lipid Profile: No results for input(s): CHOL, HDL, LDLCALC, TRIG, CHOLHDL, LDLDIRECT in the last 72 hours. Thyroid Function Tests: No results for input(s): TSH, T4TOTAL, FREET4, T3FREE, THYROIDAB in the last 72 hours. Anemia Panel: No results for input(s): VITAMINB12, FOLATE, FERRITIN, TIBC, IRON, RETICCTPCT in the last 72 hours. Urine analysis:    Component Value Date/Time   COLORURINE YELLOW 07/26/2016 2239   APPEARANCEUR CLOUDY (A) 07/26/2016 2239   LABSPEC 1.007 07/26/2016 2239   PHURINE 7.0 07/26/2016 2239   GLUCOSEU NEGATIVE 07/26/2016 2239   HGBUR TRACE (A) 07/26/2016 2239   BILIRUBINUR NEGATIVE 07/26/2016 2239   KETONESUR NEGATIVE 07/26/2016 2239   PROTEINUR NEGATIVE 07/26/2016 2239   UROBILINOGEN 1.0 09/16/2015 1633   NITRITE NEGATIVE 07/26/2016 2239   LEUKOCYTESUR NEGATIVE 07/26/2016 2239   Sepsis Labs: Invalid input(s): PROCALCITONIN, LACTICIDVEN  No results found for this or any previous visit (from the past 240 hour(s)).    Radiology Studies: Dg Chest Port 1 View  Result Date: 07/26/2016 CLINICAL DATA:  55 y/o M; shortness of breath. History of COPD and congestive heart failure. EXAM: PORTABLE CHEST 1 VIEW COMPARISON:  07/13/2016 chest radiograph. FINDINGS: Stable cardiomegaly with enlarged central pulmonary arteries and right ventricular configuration. Linear opacity in right mid lung zone is unchanged and probably represent scarring. No focal consolidation, pleural effusion, or pneumothorax is identified. Chronic left-sided rib fractures. No acute osseous abnormality is evident. IMPRESSION: Stable cardiomegaly.  No active disease. Electronically Signed   By: Kristine Garbe M.D.   On: 07/26/2016 19:59     Scheduled Meds: . aspirin EC  81 mg Oral Daily  . docusate sodium  100 mg Oral BID  . enoxaparin (LOVENOX) injection  40 mg Subcutaneous Q24H  . folic acid  1 mg Oral Daily  .  furosemide  40 mg Intravenous Daily  . ipratropium-albuterol  3 mL Nebulization Q6H  . methylPREDNISolone (SOLU-MEDROL) injection  60 mg Intravenous Q6H  . mometasone-formoterol  2 puff Inhalation BID  . multivitamin with minerals  1 tablet Oral Daily  . potassium chloride  10 mEq Oral BID  . senna  1 tablet Oral BID  . sodium chloride flush  3 mL Intravenous Q12H  . thiamine  100 mg Oral Daily   Or  . thiamine  100 mg Intravenous Daily   Continuous Infusions:     Marzetta Board, MD, PhD [facility] Pager [phone]  If 7PM-7AM,  please contact night-coverage [email or web address] Password [email or web address] 07/27/2016, 8:12 AM

## 2016-07-27 NOTE — Progress Notes (Addendum)
[facility] Progress Note Patient Name: Ricardo Burch DOB: July 05, 1961 MRN: [id number]   Date of Service  07/27/2016  HPI/Events of Note  OGT tip coiled in distal esophagus. Hiatal Hernia?  eICU Interventions  Will order: 1. Remove OGT. 2. Primary team to readdress in AM.      Intervention Category Intermediate Interventions: Diagnostic test evaluation  Sommer,Steven Cornelia Copa 07/27/2016, 6:27 PM

## 2016-07-27 NOTE — Consult Note (Signed)
PULMONARY / CRITICAL CARE MEDICINE   Name: Ricardo Burch MRN: [id number] DOB: 09/08/1961    ADMISSION DATE:  07/26/2016 CONSULTATION DATE:  07/27/2016  REFERRING MD:  Dr. Renne Crigler  CHIEF COMPLAINT:  Unresponsive  HISTORY OF PRESENT ILLNESS:   55 year old male with a past medical history significant for COPD, ongoing tobacco use, and heart failure was admitted on 07/26/2016 with a chief complaint of shortness of breath. He has been treated with oxygen for COPD in the past and continues to smoke cigarettes. Reportedly, on 07/26/2016 he used alcohol and tobacco and cocaine. Afterwards he developed some chest pain and shortness of breath. He came to the emergency department where he was treated with prednisone, bronchodilators and given IV diuresis. He was admitted to the hospitalist service. Overnight he became somewhat combative. He never received CPAP. He was found to be unresponsive early in the morning on 07/27/2016 and an ABG showed a profound hypercarbia. He is new to the intensive care unit for further evaluation.  PAST MEDICAL HISTORY :  He  has a past medical history of CHF (congestive heart failure) ([place]); Complication of anesthesia; COPD (chronic obstructive pulmonary disease) ([place]) (Dx 2015); Eczema; and Sleep apnea.  PAST SURGICAL HISTORY: He  has a past surgical history that includes No past surgeries and Multiple extractions with alveoloplasty (N/A, 03/22/2016).  Allergies  Allergen Reactions  . Apresoline [Hydralazine] Other (See Comments)    Headache   . Hydrocodone Nausea And Vomiting    No current facility-administered medications on file prior to encounter.    Current Outpatient Prescriptions on File Prior to Encounter  Medication Sig  . albuterol (PROVENTIL HFA;VENTOLIN HFA) 108 (90 Base) MCG/ACT inhaler Inhale 2 puffs into the lungs every 6 (six) hours as needed for wheezing or shortness of breath.  [name] aspirin EC 81 MG tablet Take 1 tablet (81 mg total) by mouth daily.   . budesonide-formoterol (SYMBICORT) 80-4.5 MCG/ACT inhaler Inhale 2 puffs into the lungs 2 (two) times daily.  . furosemide (LASIX) 20 MG tablet Take 1 tablet (20 mg total) by mouth 2 (two) times daily. OV needed for additional refills  . guaiFENesin (MUCINEX) 600 MG 12 hr tablet Take 2 tablets (1,200 mg total) by mouth 2 (two) times daily.  . OXYGEN Inhale 2 L into the lungs continuous.  . potassium chloride (K-DUR) 10 MEQ tablet Take 1 tablet (10 mEq total) by mouth 2 (two) times daily.  [name] tiotropium (SPIRIVA) 18 MCG inhalation capsule Place 1 capsule (18 mcg total) into inhaler and inhale daily. (Patient not taking: Reported on 07/26/2016)    FAMILY HISTORY:  His indicated that his mother is deceased. He indicated that his father is deceased. He indicated that the status of his neg hx is unknown.    SOCIAL HISTORY: He  reports that he has been smoking Cigarettes.  He has a 15.00 pack-year smoking history. He has never used smokeless tobacco. He reports that he drinks about 0.6 oz of alcohol per week . He reports that he uses drugs, including Marijuana and Cocaine.  REVIEW OF SYSTEMS:   Cannot obtain due to confusion, obtundation  SUBJECTIVE:  As above  VITAL SIGNS: BP 101/60 (BP Location: Left Arm)   Pulse (!) 106   Temp 98.5 F (36.9 C) (Oral)   Resp 18   Ht 5\' 7"  (1.702 m)   Wt 109.4 kg (241 lb 2.9 oz)   SpO2 92%   BMI 37.77 kg/m   HEMODYNAMICS:    VENTILATOR SETTINGS: Vent Mode: PCV;BIPAP  FiO2 (%):  [60 %] 60 % Set Rate:  [17 bmp] 17 bmp  INTAKE / OUTPUT: I/O last 3 completed shifts: In: 240 [P.O.:240] Out: 1600 [Urine:1600]  PHYSICAL EXAMINATION: General:  Obese male, lying in bed Neuro:  Minimally responsive to external stimuli, few grunts, HEENT:  Normocephalic atraumatic, BiPAP mask in place Cardiovascular:  Tachycardiac, no clear murmurs gallops or rubs Lungs:  Notable wheezing bilaterally, poor air movement Abdomen:  Obese, soft, bowel sounds  positive Musculoskeletal:  Normal bulk and tone Skin:  No rash or skin breakdown  LABS:  BMET  Recent Labs Lab 07/26/16 1944 07/27/16 0212  NA 132* 137  K 4.2 3.5  CL 81* 84*  CO2  --  42*  BUN 15 10  CREATININE 0.80 0.92  GLUCOSE 114* 169*    Electrolytes  Recent Labs Lab 07/27/16 0212  CALCIUM 8.6*    CBC  Recent Labs Lab 07/26/16 1935 07/26/16 1944 07/27/16 0212  WBC 11.3*  --  11.6*  HGB 16.5 18.7* 16.2  HCT 51.6 55.0* 52.5*  PLT 170  --  186    Coag's No results for input(s): APTT, INR in the last 168 hours.  Sepsis Markers No results for input(s): LATICACIDVEN, PROCALCITON, O2SATVEN in the last 168 hours.  ABG  Recent Labs Lab 07/27/16 0757  PHART 7.184*  PCO2ART 0.0*  PO2ART 56.3*    Liver Enzymes No results for input(s): AST, ALT, ALKPHOS, BILITOT, ALBUMIN in the last 168 hours.  Cardiac Enzymes No results for input(s): TROPONINI, PROBNP in the last 168 hours.  Glucose  Recent Labs Lab 07/27/16 0738  GLUCAP 156*    Imaging Dg Chest Port 1 View  Result Date: 07/26/2016 CLINICAL DATA:  55 y/o M; shortness of breath. History of COPD and congestive heart failure. EXAM: PORTABLE CHEST 1 VIEW COMPARISON:  07/13/2016 chest radiograph. FINDINGS: Stable cardiomegaly with enlarged central pulmonary arteries and right ventricular configuration. Linear opacity in right mid lung zone is unchanged and probably represent scarring. No focal consolidation, pleural effusion, or pneumothorax is identified. Chronic left-sided rib fractures. No acute osseous abnormality is evident. IMPRESSION: Stable cardiomegaly.  No active disease. Electronically Signed   By: Kristine Garbe M.D.   On: 07/26/2016 19:59     STUDIES:    CULTURES: 07/27/2016 respiratory culture  ANTIBIOTICS: None  SIGNIFICANT EVENTS: 07/27/2016 moved to ICU and intubation  LINES/TUBES: 07/27/2016 endotracheal tube  DISCUSSION: 55 year old male with a past  medical history significant for COPD with home oxygen use and ongoing tobacco, alcohol, marijuana, and cocaine abuse presented on 07/27/2016 with acute on chronic hypercapnic respiratory failure secondary to a COPD exacerbation. There is also likely a component of CHF exacerbation. His cocaine use raises the possibility for ischemic cardiac disease but we have little objective evidence of that at this time. He was intubated for airway protection in the setting of profound hypercapnic respiratory failure.  ASSESSMENT / PLAN:  PULMONARY A: Acute on chronic hypercapnic respiratory failure Acute exacerbation of COPD Tobacco abuse Possible acute pulmonary edema from acute decompensated diastolic heart failure OSA P:   Full ventilator support after intubation ABG chest x-ray after intubation Ventilator associated pneumonia prevention protocol Solu-Medrol 60 mg twice a day Change Dulera to Brovana and Pulmicort Continue Atrovent as ordered Respiratory culture Hold off on antibiotics for now  CARDIOVASCULAR A:  Hypertension Likely acute decompensated diastolic heart failure Cocaine abuse, abnormal EKG Left ventricular hypertrophy Baseline right bundle branch block P:  Repeat 12-lead EKG now Repeat troponin now Continue  diuresis Hold beta-blockade in the setting of cocaine abuse Telemetry monitoring  RENAL A:   No acute issues P:   Monitor BMET and UOP Replace electrolytes as needed   GASTROINTESTINAL A:   No acute issues P:   Famotidine for stress ulcer prophylaxis Place a G-tube Start tube feedings  HEMATOLOGIC A:   No acute issues P:  Monitor for bleeding  INFECTIOUS A:   Acute exacerbation of COPD P:   Respiratory culture Hold antibiotics  ENDOCRINE A:   No acute issues   P:   Monitor glucose  NEUROLOGIC A:   Acute encephalopathy in the setting of profound hypercarbia Cocaine abuse Ongoing alcohol abuse P:   RASS goal: -1 Intermittent sedation  protocol with fentanyl and Versed Thiamine, folate   FAMILY  - Updates: none bedside  - Inter-disciplinary family meet or Palliative Care meeting due by:    My cc time 40 minutes  Roselie Awkward, MD [place] [facility] Pager: [phone] Cell: [phone] After 3pm or if no response, call [phone]   07/27/2016, 8:39 AM

## 2016-07-27 NOTE — Progress Notes (Signed)
Hand delivered Critical ABG results to Dr Lake Bells. No changes at this time. RT will continue to monitor.

## 2016-07-27 NOTE — Progress Notes (Signed)
Pt intubated by ICU MD. No complications, 8.0 ETT secured at 25 at lip with hollister tube holder. Positive color change on ETCO2 and BBS equal. Place on vent settings per MD order. ABG to be drawn later. Will cont to monitor

## 2016-07-27 NOTE — Progress Notes (Signed)
Respiratory therapist found pt not responding. Rapid response and Dr Gloris Ham notified and at bedside. ABG and transfer orders and Bi-Pap orderd. Vital signs Temp. 98.5 oral. B/P 101/60 Heart rate 106 Respirations 18 oxygen saturation 92% on 6 L/min

## 2016-07-27 NOTE — Progress Notes (Signed)
Went to Pt. Rooms this AM to give [facility].  Pt was not responsive.  Nurse and rapid response was called.  ABG drawn and called to Rapid Response.  BIPAP was started and transferred to [facility]

## 2016-07-27 NOTE — Progress Notes (Addendum)
CRITICAL VALUE ALERT  Critical value received: Troponin 0.03  Date of notification:  07/27/16  Time of notification:  [id number]  Critical value read back:Yes.    Nurse who received alert:  Ellard Artis RN  MD notified (1st page):  Dr. Lake Bells  Time of first page:  1042  MD notified (2nd page):  Time of second page:  Responding MD:  Lake Bells  Time MD responded: [phone]

## 2016-07-27 NOTE — Procedures (Signed)
Intubation Procedure Note Zen Malotte [id number] 02-23-1961  Procedure: Intubation Indications: Airway protection and maintenance  Procedure Details Consent: Unable to obtain consent because of altered level of consciousness. Time Out: Verified patient identification, verified procedure, site/side was marked, verified correct patient position, special equipment/implants available, medications/allergies/relevent history reviewed, required imaging and test results available.  Performed  Drugs Etomidate 20mg , Fentanyl 132mcg, Versed 2mg , Rocuronium 100mg  DL x 1 with GS 4 blade Grade 1 view 8.0 ET tube passed through cords under direct visualization Placement confirmed with bilateral breath sounds, positive EtCO2 change and smoke in tube   Evaluation Hemodynamic Status: BP stable throughout; O2 sats: stable throughout Patient's Current Condition: stable Complications: No apparent complications Patient did tolerate procedure well. Chest X-ray ordered to verify placement.  CXR: pending.   Simonne Maffucci 07/27/2016

## 2016-07-27 NOTE — Progress Notes (Signed)
Report called to Saralyn Pilar, RN  - 76M .  Pt to be transferred for further evaluation.  Karie Kirks, Therapist, sports.

## 2016-07-27 NOTE — Progress Notes (Signed)
Initial Nutrition Assessment  DOCUMENTATION CODES:   Obesity unspecified  INTERVENTION:  If unable to wean as expected: Start continuous feeding of Vital High Protein @ 40 ml/hr and add ProStat 60 ml BID via OGT. Nutrition support will provide: 1360 kcal, 144 gr protein and 802 ml water.   NUTRITION DIAGNOSIS:   Inadequate oral intake related to inability to eat as evidenced by NPO status.   GOAL:   Provide needs based on ASPEN/SCCM guidelines   MONITOR:   TF tolerance, I & O's, Weight trends, Vent status  REASON FOR ASSESSMENT:   Ventilator    ASSESSMENT: pt presents with chest pain following excessive alcohol intake and cocaine use. He has edema bilateral lower extremities and takes a diuretic. His BNP is elevated. His nurse reports his output is very good 3 liters so far today.  Currently he is  intubated for airway protection. BP-100/64, Temp (37.4).  His weight hx is stable. No overt physical evidence of malnutrition observed .    Recent Labs Lab 07/26/16 1944 07/27/16 0212  NA 132* 137  K 4.2 3.5  CL 81* 84*  CO2  --  42*  BUN 15 10  CREATININE 0.80 0.92  CALCIUM  --  8.6*  GLUCOSE 114* 169*    Diet Order:  Diet NPO time specified  Skin:  Reviewed, no issues  Last BM:  prior to admission  Height:   Ht Readings from Last 1 Encounters:  07/27/16 5\' 7"  (1.702 m)    Weight:   Wt Readings from Last 1 Encounters:  07/27/16 241 lb 2.9 oz (109.4 kg)    Ideal Body Weight:  67 kg  BMI:  Body mass index is 37.77 kg/m.  Estimated Nutritional Needs:   Kcal:  [id number]  Protein:  134-147 gr  Fluid:  >2.0 liters daily  EDUCATION NEEDS:   No education needs identified at this time  Colman Cater MS,RD,CSG,LDN Office: [phone] Pager: [phone]

## 2016-07-28 ENCOUNTER — Inpatient Hospital Stay (HOSPITAL_COMMUNITY): Payer: Medicaid Other

## 2016-07-28 LAB — BASIC METABOLIC PANEL
ANION GAP: 7 (ref 5–15)
BUN: 9 mg/dL (ref 6–20)
CO2: 43 mmol/L — ABNORMAL HIGH (ref 22–32)
Calcium: 9 mg/dL (ref 8.9–10.3)
Chloride: 89 mmol/L — ABNORMAL LOW (ref 101–111)
Creatinine, Ser: 1.15 mg/dL (ref 0.61–1.24)
GLUCOSE: 115 mg/dL — AB (ref 65–99)
POTASSIUM: 4 mmol/L (ref 3.5–5.1)
Sodium: 139 mmol/L (ref 135–145)

## 2016-07-28 LAB — CBC
HEMATOCRIT: 50.9 % (ref 39.0–52.0)
HEMOGLOBIN: 16.5 g/dL (ref 13.0–17.0)
MCH: 33.4 pg (ref 26.0–34.0)
MCHC: 32.4 g/dL (ref 30.0–36.0)
MCV: 103 fL — ABNORMAL HIGH (ref 78.0–100.0)
Platelets: 168 10*3/uL (ref 150–400)
RBC: 4.94 MIL/uL (ref 4.22–5.81)
RDW: 14.8 % (ref 11.5–15.5)
WBC: 16.2 10*3/uL — ABNORMAL HIGH (ref 4.0–10.5)

## 2016-07-28 MED ORDER — PREDNISONE 10 MG PO TABS: 10.0000 mg | ORAL_TABLET | Freq: Every day | ORAL | Status: DC

## 2016-07-28 MED ORDER — CHLORHEXIDINE GLUCONATE 0.12 % MT SOLN: 15.0000 mL | Freq: Two times a day (BID) | OROMUCOSAL | Status: DC

## 2016-07-28 MED ORDER — ORAL CARE MOUTH RINSE: 15.0000 mL | Freq: Two times a day (BID) | OROMUCOSAL | Status: DC

## 2016-07-28 MED ORDER — PREDNISONE 20 MG PO TABS: 20.0000 mg | ORAL_TABLET | Freq: Every day | ORAL | Status: DC

## 2016-07-28 MED ORDER — FENTANYL CITRATE (PF) 100 MCG/2ML IJ SOLN: 12.5000 ug | INTRAMUSCULAR | Status: DC | PRN

## 2016-07-28 MED ORDER — PREDNISONE 20 MG PO TABS
40.0000 mg | ORAL_TABLET | Freq: Every day | ORAL | Status: DC
  Administered 2016-07-29 – 2016-07-30 (×2): 40 mg via ORAL
  Filled 2016-07-28 (×2): qty 2

## 2016-07-28 MED ORDER — PREDNISONE 20 MG PO TABS: 30.0000 mg | ORAL_TABLET | Freq: Every day | ORAL | Status: DC

## 2016-07-28 NOTE — Procedures (Signed)
Extubation Procedure Note  Patient Details:   Name: Ricardo Burch DOB: 1961-09-29 MRN: [id number]   Airway Documentation:     Evaluation  O2 sats: stable throughout Complications: No apparent complications Patient did tolerate procedure well. Bilateral Breath Sounds: Clear, Diminished   Yes   Pt extubated per MD order.  Pt placed on 4L [place] with sats of 94%.  RT will contiue to monitor.  Pierre Bali 07/28/2016, 11:51 AM

## 2016-07-28 NOTE — Progress Notes (Signed)
Orders received for swallow evaluation. Discussed with RN. Patient has been intubated less than 48 hours and has minimal risk factors for aspiration (COPD). Per RN, consuming thin liquids, ice chips without overt s/s of aspiration. Per RN, order is being discontinued. This appear appropriate based on discussion. Please re-consult if needed.   [place], [place] [phone]

## 2016-07-28 NOTE — Progress Notes (Signed)
[facility] Progress Note Patient Name: Ricardo Burch DOB: 17-Feb-1961 MRN: [id number]   Date of Service  07/28/2016  HPI/Events of Note  Tolerating clear liquids  eICU Interventions  Will advance diet as tolerated.      Intervention Category Major Interventions: Other:  Tais Koestner 07/28/2016, 3:39 PM

## 2016-07-28 NOTE — Progress Notes (Signed)
PULMONARY / CRITICAL CARE MEDICINE   Name: Ricardo Burch MRN: [id number] DOB: 26-Mar-1961    ADMISSION DATE:  07/26/2016 CONSULTATION DATE:  07/27/2016  REFERRING MD:  Dr. Renne Crigler  CHIEF COMPLAINT:  Unresponsive  BRIEF: 55 y/o male with polysubstance abuse COPD exacerbation intubated for hypercarbia on 9/24.   SUBJECTIVE:  OG not in place Diuresed yesterday WBC up   VITAL SIGNS: BP 104/83   Pulse (!) 102   Temp 99.2 F (37.3 C) (Oral)   Resp 18   Ht 5\' 7"  (1.702 m)   Wt 109.4 kg (241 lb 2.9 oz)   SpO2 91%   BMI 37.77 kg/m   HEMODYNAMICS:    VENTILATOR SETTINGS: Vent Mode: PRVC FiO2 (%):  [40 %-50 %] 40 % Set Rate:  [20 bmp] 20 bmp Vt Set:  [530 mL] 530 mL PEEP:  [5 cmH20] 5 cmH20 Plateau Pressure:  [16 cmH20-20 cmH20] 17 cmH20  INTAKE / OUTPUT: I/O last 3 completed shifts: In: 44 [P.O.:240; Other:220; IV [id number] Out: [id number] [Urine:8195]  PHYSICAL EXAMINATION: General:  Obese male, lying in bed on vent Neuro:  Awake, alert, on vent HEENT:  Normocephalic atraumatic, ETT in place Cardiovascular:  RRR no mgr Lungs:  No wheezing Abdomen:  Obese, soft, bowel sounds positive Musculoskeletal:  Normal bulk and tone Skin:  No rash or skin breakdown, edema improved  LABS:  BMET  Recent Labs Lab 07/26/16 1944 07/27/16 0212 07/28/16 0155  NA 132* 137 139  K 4.2 3.5 4.0  CL 81* 84* 89*  CO2  --  42* 43*  BUN 15 10 9   CREATININE 0.80 0.92 1.15  GLUCOSE 114* 169* 115*    Electrolytes  Recent Labs Lab 07/27/16 0212 07/28/16 0155  CALCIUM 8.6* 9.0    CBC  Recent Labs Lab 07/26/16 1935 07/26/16 1944 07/27/16 0212 07/28/16 0155  WBC 11.3*  --  11.6* 16.2*  HGB 16.5 18.7* 16.2 16.5  HCT 51.6 55.0* 52.5* 50.9  PLT 170  --  186 168    Coag's No results for input(s): APTT, INR in the last 168 hours.  Sepsis Markers No results for input(s): LATICACIDVEN, PROCALCITON, O2SATVEN in the last 168 hours.  ABG  Recent Labs Lab  07/27/16 0757 07/27/16 1048  PHART 7.184* 7.341*  PCO2ART 0.0* 84.2*  PO2ART 56.3* 65.2*    Liver Enzymes No results for input(s): AST, ALT, ALKPHOS, BILITOT, ALBUMIN in the last 168 hours.  Cardiac Enzymes  Recent Labs Lab 07/27/16 0923  TROPONINI 0.03*    Glucose  Recent Labs Lab 07/27/16 0738 07/27/16 0842  GLUCAP 156* 179*    Imaging Dg Chest Port 1 View  Result Date: 07/28/2016 CLINICAL DATA:  Acute respiratory failure with hypoxemia. EXAM: PORTABLE CHEST 1 VIEW COMPARISON:  Yesterday. FINDINGS: Endotracheal tube in satisfactory position. The nasogastric tube has been removed. Stable enlarged cardiac silhouette. Clear lungs. Old, healed left seventh and eighth rib fractures. Thoracic spine degenerative changes. IMPRESSION: No acute abnormality.  Stable cardiomegaly. Electronically Signed   By: Claudie Revering M.D.   On: 07/28/2016 07:17   Dg Abd Portable 1v  Result Date: 07/27/2016 CLINICAL DATA:  OG tube placement EXAM: PORTABLE ABDOMEN - 1 VIEW COMPARISON:  None. FINDINGS: Enteric tube is presumably looped within a moderate hiatal hernia. Sandra bowel gas pattern. Moderate brain lumbar spine. IMPRESSION: Enteric tube is presumably looped within a moderate hiatal hernia. Electronically Signed   By: Julian Hy M.D.   On: 07/27/2016 18:14     STUDIES:  CULTURES: 07/27/2016 respiratory culture  ANTIBIOTICS: None  SIGNIFICANT EVENTS: 07/27/2016 moved to ICU and intubation  LINES/TUBES: 07/27/2016 endotracheal tube  DISCUSSION: 55 year old male with a past medical history significant for COPD with home oxygen use and ongoing tobacco, alcohol, marijuana, and cocaine abuse presented on 07/27/2016 with acute on chronic hypercapnic respiratory failure secondary to a COPD exacerbation and CHF exacerbation.  Improved 9/24 after diuresis, so favor more heart failure than COPD.  ASSESSMENT / PLAN:  PULMONARY A: Acute on chronic hypercapnic respiratory  failure > improved Acute exacerbation of COPD Tobacco abuse Acute pulmonary edema from acute decompensated diastolic heart failure > better OSA> suspect obesity hypoventilation syndrome P:   Extubate today BIPAP qHS ABG tomorrow in daytime Stop solumedrol, prednisone taper to start 9/25 Brovana and Pulmicort Continue Duoneb as ordered Follow Respiratory culture D/C Levaquin antibiotics for now  CARDIOVASCULAR A:  Hypertension Acute decompensated diastolic heart failure Cocaine abuse, abnormal EKG Left ventricular hypertrophy Baseline right bundle branch block Demand ischemia P:  Lasix on hold 9/24 Consider cardiology consult 9/25 Telemetry monitoring  RENAL A:   No acute issues P:   Monitor BMET and UOP Replace electrolytes as needed   GASTROINTESTINAL A:   No acute issues P:   Famotidine for stress ulcer prophylaxis Advance diet today after extubation  HEMATOLOGIC A:   No acute issues P:  Monitor for bleeding  INFECTIOUS A:   Acute exacerbation of COPD P:   Respiratory culture Hold antibiotics  ENDOCRINE A:   No acute issues   P:   Monitor glucose  NEUROLOGIC A:   Acute encephalopathy in the setting of profound hypercarbia> improved Cocaine abuse Ongoing alcohol abuse P:   Stop sedation Thiamine, folate   FAMILY  - Updates: none bedside  - Inter-disciplinary family meet or Palliative Care meeting due by:    My cc time 35 minutes  Roselie Awkward, MD [place] [facility] Pager: [phone] Cell: [phone] After 3pm or if no response, call [phone]   07/28/2016, 11:16 AM

## 2016-07-29 DIAGNOSIS — G4733 Obstructive sleep apnea (adult) (pediatric): Secondary | ICD-10-CM

## 2016-07-29 DIAGNOSIS — G9340 Encephalopathy, unspecified: Secondary | ICD-10-CM

## 2016-07-29 DIAGNOSIS — J9621 Acute and chronic respiratory failure with hypoxia: Secondary | ICD-10-CM

## 2016-07-29 DIAGNOSIS — J9622 Acute and chronic respiratory failure with hypercapnia: Secondary | ICD-10-CM

## 2016-07-29 LAB — CBC
HCT: 49.1 % (ref 39.0–52.0)
HEMOGLOBIN: 15.7 g/dL (ref 13.0–17.0)
MCH: 32.7 pg (ref 26.0–34.0)
MCHC: 32 g/dL (ref 30.0–36.0)
MCV: 102.3 fL — ABNORMAL HIGH (ref 78.0–100.0)
Platelets: 189 10*3/uL (ref 150–400)
RBC: 4.8 MIL/uL (ref 4.22–5.81)
RDW: 15.4 % (ref 11.5–15.5)
WBC: 16.2 10*3/uL — ABNORMAL HIGH (ref 4.0–10.5)

## 2016-07-29 LAB — BASIC METABOLIC PANEL
Anion gap: 7 (ref 5–15)
BUN: 14 mg/dL (ref 6–20)
CHLORIDE: 93 mmol/L — AB (ref 101–111)
CO2: 38 mmol/L — ABNORMAL HIGH (ref 22–32)
Calcium: 8.5 mg/dL — ABNORMAL LOW (ref 8.9–10.3)
Creatinine, Ser: 0.91 mg/dL (ref 0.61–1.24)
GFR calc non Af Amer: >60 mL/min (ref >60)
Glucose, Bld: 82 mg/dL (ref 65–99)
POTASSIUM: 3.9 mmol/L (ref 3.5–5.1)
SODIUM: 138 mmol/L (ref 135–145)

## 2016-07-29 MED ORDER — FUROSEMIDE 20 MG PO TABS
20.0000 mg | ORAL_TABLET | Freq: Every day | ORAL | Status: DC
  Administered 2016-07-29 – 2016-07-30 (×2): 20 mg via ORAL
  Filled 2016-07-29 (×2): qty 1

## 2016-07-29 MED ORDER — VITAMIN B-1 100 MG PO TABS
100.0000 mg | ORAL_TABLET | Freq: Every day | ORAL | Status: DC
  Administered 2016-07-29 – 2016-07-30 (×2): 100 mg via ORAL
  Filled 2016-07-29 (×2): qty 1

## 2016-07-29 MED ORDER — FOLIC ACID 1 MG PO TABS
1.0000 mg | ORAL_TABLET | Freq: Every day | ORAL | Status: DC
  Administered 2016-07-29 – 2016-07-30 (×2): 1 mg via ORAL
  Filled 2016-07-29 (×2): qty 1

## 2016-07-29 NOTE — Progress Notes (Signed)
BiPAP in room on standby.  PT on [place] 4 L/min sat 98.  Will continue to monitor.

## 2016-07-29 NOTE — Progress Notes (Signed)
1534 Report received from [place] 83M  1600 transferred in from 83M via wheelchair the patient not in apparent distress . Kept comfortable in dbed . Safety precaution observed . Orientation to equiopment and unit done

## 2016-07-29 NOTE — Hospital Discharge Follow-Up (Signed)
Transitional Care Clinic Care Coordination Note:  Admit date:  07/26/2106 Discharge date: TBD Discharge Disposition: home Patient contact:  # [phone] ( cell) Emergency contact(s):  Betsy Pries  # [phone]  This Case Manager reviewed patient's EMR and determined patient would benefit from post-discharge medical management and chronic care management services through the [facility]. Patient has a history of COPD. OSA on CPAP, diastolic heart failure, depression, cocaine use, alcohol abuse. He has been hospitalized 4 times in the past year and has been in the ED twice in the past year. He was admitted with chest pain and shortness of breath after an ETOH binge and cocaine use. . This Case Manager met with patient to discuss the services and medical management that can be provided at the [facility]. Patient verbalized understanding and agreed to receive post-discharge care at the [facility].   Patient scheduled for Transitional Care appointment on 08/06/2016 @ 1400.  Clinic information and appointment time provided to patient. Appointment information also placed on AVS.  Assessment:       Home Environment:  Lives alone in rental basement apartment. He said that he has a bathroom in the basement but the stairs that he needs to navigate to get out of the house or to get to the kitchen are becoming too difficult for him. He noted that he is not sure how long he will be able to live there.        Support System:  He said that he has no support. He noted that he has a daughter who just had a baby who lives in [place] and another daughter who is in college in [place].        Level of functioning: independent.        Home DME: O2 concentrator, CPAP and portable O2 tanks. All from [place]. He said that his pulmonologist at [facility] is working on ordering a IT sales professional for him        Home care services: (services arranged prior to  discharge or new services after discharge) none at present        Transportation: he said that he drives.         Food/Nutrition: (ability to afford, access, use of any community resources) He said that he lost his food stamps and has not yet re-applied.         Medications: (ability to afford, access, compliance, Pharmacy used) - He has used [facility].  He stated that his co-pays are usually $3.00-4.00 and he thinks that he needs to bring an IRS statement to the pharmacy in order to get his medications. This CM to follow up with the [facility] for him        Identified Barriers: possible need to move/ difficulty obtaining housing., questionable compliance with his medications, lack of support in the community, landlords may not allow him to have home health/community services.        PCP (Name, office location, phone number): Dr Adrian Blackwater - [facility]  Patient Education: Explained the services that are provided at the [facility]  Including pharmacy assistance, social work. Also explained to him the options of home health care and [facility]. Carmela Hurt, RN CM spoke to him last week about the home services and he stated at that time that he needed to think about it. Today he explained that he will need to check with his landlords prior to accepting services as he is not sure that they  will allow people from the community into the home.  He said that he will not be able to speak to them until after he gets home.  Also discussed the services provided by the [facility] and [employer]. Provided him the phone numbers for both agencies. He said that he was familiar with [employer].  He stated that he receives $928/month in disability.         Elenor Quinones, RN CM notified that this CM was going to meet with the patient.  This CM called [facility] and spoke to [facility] who stated that he has 3 prescriptions ready for pick up and there is no indication that he needs a document from the IRS to obtain  the medications. She also noted that she ran his medicaid and the message noted from medicaid indicated that the patient has another primary insurance.

## 2016-07-29 NOTE — Progress Notes (Addendum)
PULMONARY / CRITICAL CARE MEDICINE   Name: Ricardo Burch MRN: [id number] DOB: Apr 09, 1961    ADMISSION DATE:  07/26/2016 CONSULTATION DATE:  07/27/2016  REFERRING MD:  Dr. Renne Crigler  CHIEF COMPLAINT:  Unresponsive  BRIEF: 55 y/o male with polysubstance abuse and COPD exacerbation intubated for hypercarbia on 9/23.    SUBJECTIVE:  Extubated yesterday.   Wants to go home.  On 4L [place] (wears 2LNC at home). Did not tolerate our CPAP.  Eating/drinking well.     VITAL SIGNS: BP (!) 134/104   Pulse 94   Temp 98.7 F (37.1 C) (Oral)   Resp 15   Ht 5\' 7"  (1.702 m)   Wt 109.4 kg (241 lb 2.9 oz)   SpO2 94%   BMI 37.77 kg/m   HEMODYNAMICS:    VENTILATOR SETTINGS: Vent Mode: BIPAP;PCV FiO2 (%):  [40 %] 40 % Set Rate:  [10 bmp] 10 bmp PEEP:  [5 cmH20] 5 cmH20  INTAKE / OUTPUT: I/O last 3 completed shifts: In: 300 [Other:150; IV Piggyback:150] Out: 5645 [Urine:5645]  PHYSICAL EXAMINATION: General:  Obese male, NAD eating breakfast  Neuro:  Awake, alert, appropriate  HEENT:  Normocephalic atraumatic, mm moist  Cardiovascular:  RRR no mgr Lungs:  resps even non labored, did get slightly dyspneic with conversation, diminished bases, few exp wheeze  Abdomen:  Obese, soft, bowel sounds positive Musculoskeletal:  Normal bulk and tone Skin:  No rash or skin breakdown, edema improved  LABS:  BMET  Recent Labs Lab 07/27/16 0212 07/28/16 0155 07/29/16 0214  NA 137 139 138  K 3.5 4.0 3.9  CL 84* 89* 93*  CO2 42* 43* 38*  BUN 10 9 14   CREATININE 0.92 1.15 0.91  GLUCOSE 169* 115* 82    Electrolytes  Recent Labs Lab 07/27/16 0212 07/28/16 0155 07/29/16 0214  CALCIUM 8.6* 9.0 8.5*    CBC  Recent Labs Lab 07/27/16 0212 07/28/16 0155 07/29/16 0214  WBC 11.6* 16.2* 16.2*  HGB 16.2 16.5 15.7  HCT 52.5* 50.9 49.1  PLT 186 168 189    Coag's No results for input(s): APTT, INR in the last 168 hours.  Sepsis Markers No results for input(s): LATICACIDVEN,  PROCALCITON, O2SATVEN in the last 168 hours.  ABG  Recent Labs Lab 07/27/16 0757 07/27/16 1048  PHART 7.184* 7.341*  PCO2ART 0.0* 84.2*  PO2ART 56.3* 65.2*    Liver Enzymes No results for input(s): AST, ALT, ALKPHOS, BILITOT, ALBUMIN in the last 168 hours.  Cardiac Enzymes  Recent Labs Lab 07/27/16 0923  TROPONINI 0.03*    Glucose  Recent Labs Lab 07/27/16 0738 07/27/16 0842  GLUCAP 156* 179*    Imaging No results found.   STUDIES:    CULTURES: 07/27/2016 respiratory culture >>>  ANTIBIOTICS: None  SIGNIFICANT EVENTS: 07/27/2016 moved to ICU and intubation  LINES/TUBES: 07/27/2016 endotracheal tube  DISCUSSION: 55 year old male with a past medical history significant for COPD with home oxygen use and ongoing tobacco, alcohol, marijuana, and cocaine abuse presented on 07/27/2016 with acute on chronic hypercapnic respiratory failure secondary to a COPD exacerbation and CHF exacerbation.  Improved 9/24 after diuresis, so favor more heart failure than COPD.  ASSESSMENT / PLAN:  PULMONARY A: Acute on chronic hypercapnic respiratory failure > improved Acute exacerbation of COPD Tobacco abuse Acute pulmonary edema from acute decompensated diastolic heart failure > better OSA> suspect obesity hypoventilation syndrome P:   Pulmonary hygiene  Mobilize  qhs CPAP  prednisone taper  Brovana and Pulmicort Continue Duoneb as ordered Follow Respiratory culture  CARDIOVASCULAR A:  Hypertension Acute decompensated diastolic heart failure Cocaine abuse, abnormal EKG - resolved.  Left ventricular hypertrophy Baseline right bundle branch block Demand ischemia P:  Resume home lasix  Telemetry monitoring  RENAL A:   No acute issues P:   Monitor BMET and UOP Replace electrolytes as needed Resume home lasix   GASTROINTESTINAL A:   No acute issues P:   Famotidine for stress ulcer prophylaxis Advance diet as tol   HEMATOLOGIC A:   No acute  issues P:  Monitor for bleeding F/u CBC  INFECTIOUS A:   Acute exacerbation of COPD P:   Respiratory culture Hold antibiotics  ENDOCRINE A:   No acute issues   P:   Monitor glucose  NEUROLOGIC A:   Acute encephalopathy - Multifactorial & in the setting of profound hypercarbia> improved Cocaine abuse Ongoing alcohol abuse P:   Substance abuse counseling  Thiamine, folate   FAMILY  - Updates: pt updated at length 9/25.    Concerned about finances and being able to afford any new meds and f/u office visits.  Says "its a long story" but he was kicked out of the friends' house he was living in and he owes [facility] for CPAP supplies.  Will place CM consult to assist.   [place] tele. Keep on PCCM service as likely d/c home 9/26.   Nickolas Madrid, NP 07/29/2016  10:14 AM Pager: [phone] or [phone]  PCCM Attending Note: Patient denies any dyspnea or cough at this time. Denies any chest pain or pressure.  REVIEW OF SYSTEMS: Fever, chills, or sweats. No nausea or vomiting. No headache or vision changes.  BP (!) 130/91 (BP Location: Right Arm)   Pulse 99   Temp 99.5 F (37.5 C) (Oral)   Resp (!) 23   Ht 5\' 7"  (1.702 m)   Wt 241 lb 2.9 oz (109.4 kg)   SpO2 95%   BMI 37.77 kg/m  General:  Awake. Alert. No acute distress.  Integument:  Warm & dry. No rash on exposed skin.  HEENT:  Moist mucus membranes. No oral ulcers. No scleral injection or icterus.  Cardiovascular:  Regular rate. No edema. No appreciable JVD.  Pulmonary:  Improving aeration bilaterally. Normal work of breathing on nasal cannula oxygen. Speaking in complete sentences. Abdomen: Soft. Normal bowel sounds. Nondistended. Grossly nontender. Musculoskeletal:  Normal bulk and tone. No joint deformity or effusion appreciated. Neurological: Oriented 4. Grossly nonfocal. Cranial nerves intact grossly.  A/P: 1. Acute on chronic hypercarbic respiratory failure: Improving. Stable post extubation. Continuing  Brovana & Pulmicort twice daily. Continuing Duonebs every 6 hours. Continuing patient on current dose of prednisone. 2. Acute on chronic hypoxic respiratory failure: Continuing to wean FiO2 for saturation 88-92 percent. Early mobilization & pulmonary toilette. Continuing Lasix 20 mg by mouth daily. 3. OSA: Suspect OHS. Continuing nocturnal CPAP. 4. Acute encephalopathy: Resolved. Likely multifactorial from hypoxia, hypercarbia, & drug use.  Remainder as noted above. Transferring to Telemetry. Leaving on PCCM service.   Sonia Baller Ashok Cordia, M.D. [facility] Pulmonary & Critical Care Pager:  [phone] After 3pm or if no response, call [phone] 2:47 PM 07/29/16

## 2016-07-29 NOTE — Care Management Note (Addendum)
Case Management Note  Patient Details  Name: Ricardo Burch MRN: [id number] Date of Birth: 02/24/1961  Subjective/Objective:     55 y/o male with polysubstance abuse and COPD exacerbation intubated for hypercarbia on 9/23.               Action/Plan:  PTA from home on O2 (2-4L) supplied by [facility].  Pt is active with [place] - can either get medications at their pharmacy or use his active medicaid for $3 copay.  Per TTC liaison - pt has been advised prior  that he only needs to pay $1 if he is unable to pay his pre determined copay for both PCP visit and medications.   CM spoke with TCC coordinator ; pt is eligible of Partnership for [facility] - already confirmed by project liaison.  Per TCC - the [facility] was attempting to get pt set up with [facility] in the community.  TCC liaison to met with pt at bedside today.  CM contacted [place] to inquire about concerns with continuing services/supplies do to fees owed - liaison to follow back up with CM.  CM will continue to follow for discharge needs   Expected Discharge Date:  07/29/16               Expected Discharge Plan:  Home/Self Care  In-House Referral:     Discharge planning Services  CM Consult  Post Acute Care Choice:    Choice offered to:     DME Arranged:    DME Agency:     HH Arranged:    HH Agency:     Status of Service:  In process, will continue to follow  If discussed at Long Length of Stay Meetings, dates discussed:    Additional Comments:  Maryclare Labrador, RN 07/29/2016, 1:39 PM

## 2016-07-30 DIAGNOSIS — F1010 Alcohol abuse, uncomplicated: Secondary | ICD-10-CM

## 2016-07-30 MED ORDER — FUROSEMIDE 20 MG PO TABS: 20.0000 mg | ORAL_TABLET | Freq: Two times a day (BID) | ORAL | 1 refills | Status: DC

## 2016-07-30 MED ORDER — POTASSIUM CHLORIDE ER 10 MEQ PO TBCR: 10.0000 meq | EXTENDED_RELEASE_TABLET | Freq: Every day | ORAL | 11 refills | Status: DC

## 2016-07-30 MED ORDER — POTASSIUM CHLORIDE ER 10 MEQ PO TBCR: 10.0000 meq | EXTENDED_RELEASE_TABLET | Freq: Two times a day (BID) | ORAL | 11 refills | Status: DC

## 2016-07-30 MED ORDER — PREDNISONE 20 MG PO TABS: ORAL_TABLET | ORAL | 0 refills | Status: DC

## 2016-07-30 MED ORDER — FUROSEMIDE 20 MG PO TABS: 20.0000 mg | ORAL_TABLET | Freq: Every day | ORAL | 1 refills | Status: DC

## 2016-07-30 MED FILL — predniSONE 20 MG TABS: 20 | 12 days supply | Qty: 15 | Fill #0

## 2016-07-30 NOTE — Progress Notes (Signed)
Patient stated that he has oxygen at home through [place]; [place] with [facility] called for oxygen tank for home/transport. Mindi Slicker [facility] [phone]

## 2016-07-30 NOTE — Discharge Summary (Signed)
Physician Discharge Summary  Patient ID: Ricardo Burch MRN: [id number] DOB/AGE: [birth year] 55 y.o.  Admit date: 07/26/2016 Discharge date: 07/30/2016    Discharge Diagnoses:  Acute on Chronic Hypercapnic Respiratory Failure  Acute Exacerbation of COPD  Tobacco Abuse  Acute Pulmonary Edema  OSA  Suspected Obesity Hypoventilation Syndrome  Acute Decompensated Diastolic Heart Failure  Hypertension  Cocaine Abuse Left Ventricular Hypertrophy RBBB - baseline Demand Ischemia  Acute Encephalopathy  Ongoing Alcohol Abuse                                                                          DISCHARGE PLAN BY DIAGNOSIS     Acute on Chronic Hypercapnic Respiratory Failure  Acute Exacerbation of COPD  Tobacco Abuse  Acute Pulmonary Edema  OSA  Suspected Obesity Hypoventilation Syndrome   Discharge Plan: Continue O2 at 4L continuously  Prednisone taper to off Follow up with Dr. Vaughan Browner as arranged  Smoking cessation counseling  Continue albuterol PRN as rescue inhaler  Continue Symbicort + Spiriva as maintenance regimen Continue CPAP as previously prescribed   Acute Decompensated Diastolic Heart Failure  Hypertension  Cocaine Abuse Left Ventricular Hypertrophy RBBB - baseline Demand Ischemia   Discharge Plan: Resume lasix 20 mg BID with KCL  Follow up at [facility] on 10/3   Acute Encephalopathy - resolved Ongoing Alcohol Abuse   Discharge Plan: ETOH abuse cessation counseling                   DISCHARGE SUMMARY   Ricardo Burch is a 55 y.o. y/o male with a PMH of eczema, OSA on CPAP, O2 dependent COPD, CHF, medical non-compliance and polysubstance abuse who was admitted on 9/22 with wheezing and progressive shortness of breath.    The patient admitted to an alcohol and cocaine binge the day prior to presentation.  He also had stopped taking his medications due to "stress and depression".  He turned up his O2 to 5L without relief of symptoms. EMS  was activated and found him to have saturations in the 60's.  He also reported a single episode of chest discomfort and bilateral lower extremity swelling.  UDS was positive for cocaine.  The patient was evaluated and found to have concern for an acute exacerbation of COPD and decompensated diastolic CHF.  He was admitted per [facility] and treated with IV steroids, bronchodilators, nocturnal CPAP and diuresis.  Overnight 9/22, he became combative and agitated.  He never used CPAP.  AM of 9/23, he was found to have hypercarbic respiratory failure requiring intubation.  The patient remained on mechanical ventilation until 9/24 at which time he was liberated from the vent without difficulty. He was transitioned to oral prednisone.  O2 weaned to 4L per [place].  He was counseled regarding cocaine and alcohol abuse.  The patient was seen and evaluated by [facility] for home health support.  He medically cleared for discharge 9/26 with plans as above.    CULTURES: 9/23 respiratory culture >> not completed   SIGNIFICANT EVENTS: 9/23  moved to ICU and intubation 9/24  Extubated   LINES/TUBES: ETT 9/23 >> 9/24   Discharge Exam: General: chronically ill appearing male in NAD  Neuro: AAOx4, speech clear, MAE CV:  s1s2 rrr, no m/r/g PULM: even/non-labored on  O2, lungs bilaterally clear NL:[id number]/ALPF, bsx4 active  Extremities: warm/dry, no edema   Vitals:   07/30/16 0846 07/30/16 0851 07/30/16 0854 07/30/16 1229  BP:    (!) 144/95  Pulse:    (!) 105  Resp:    20  Temp:    98.6 F (37 C)  TempSrc:    Oral  SpO2: 97% 97% 97% 93%  Weight:      Height:         Discharge Labs  BMET  Recent Labs Lab 07/26/16 1944 07/27/16 0212 07/28/16 0155 07/29/16 0214  NA 132* 137 139 138  K 4.2 3.5 4.0 3.9  CL 81* 84* 89* 93*  CO2  --  42* 43* 38*  GLUCOSE 114* 169* 115* 82  BUN _0 CREATININE 0.80 0.92 1.15 0.91  CALCIUM  --  8.6* 9.0 8.5*    CBC  Recent Labs Lab 07/27/16 0212 07/28/16 0155  07/29/16 0214  HGB 16.2 16.5 15.7  HCT 52.5* 50.9 49.1  WBC 11.6* 16.2* 16.2*  PLT 186 168 189     Discharge Instructions    (HEART FAILURE PATIENTS) Call MD:  Anytime you have any of the following symptoms: 1) 3 pound weight gain in 24 hours or 5 pounds in 1 week 2) shortness of breath, with or without a dry hacking cough 3) swelling in the hands, feet or stomach 4) if you have to sleep on extra pillows at night in order to breathe.    Complete by:  As directed    Call MD for:  difficulty breathing, headache or visual disturbances    Complete by:  As directed    Call MD for:  extreme fatigue    Complete by:  As directed    Call MD for:  hives    Complete by:  As directed    Call MD for:  persistant dizziness or light-headedness    Complete by:  As directed    Call MD for:  persistant nausea and vomiting    Complete by:  As directed    Call MD for:  severe uncontrolled pain    Complete by:  As directed    Call MD for:  temperature >100.4    Complete by:  As directed    Diet - low sodium heart healthy    Complete by:  As directed    Discharge instructions    Complete by:  As directed    1.  Review your medications carefully as they have changed.   2.  Weigh yourself daily and record.  If you notice weight gain, please call your primary MD  3.  Follow up with the [facility] 4.  Call for new or worsening symptoms   Increase activity slowly    Complete by:  As directed        Follow-up [place] on 08/06/2016.   Specialty:  Internal Medicine Why:  at 2:00pm for an appointment in the [facility] with Dr Jarold Song. Please bring all of your medications to your appointment,  Contact information: [address] [id number] [place]       Marshell Garfinkel, MD Follow up on 08/21/2016.   Specialty:  Pulmonary Disease Why:  Appt at 11:00 AM  Contact  information: [address] [place]  [address] [phone]  Medication List    TAKE these medications   albuterol 108 (90 Base) MCG/ACT inhaler Commonly known as:  PROVENTIL HFA;VENTOLIN HFA Inhale 2 puffs into the lungs every 6 (six) hours as needed for wheezing or shortness of breath.   aspirin EC 81 MG tablet Take 1 tablet (81 mg total) by mouth daily.   budesonide-formoterol 80-4.5 MCG/ACT inhaler Commonly known as:  SYMBICORT Inhale 2 puffs into the lungs 2 (two) times daily.   diphenhydramine-acetaminophen 25-500 MG Tabs tablet Commonly known as:  TYLENOL PM Take 1 tablet by mouth at bedtime as needed.   furosemide 20 MG tablet Commonly known as:  LASIX Take 1 tablet (20 mg total) by mouth 2 (two) times daily. OV needed for additional refills   guaiFENesin 600 MG 12 hr tablet Commonly known as:  MUCINEX Take 2 tablets (1,200 mg total) by mouth 2 (two) times daily.   OXYGEN Inhale 2 L into the lungs continuous.   potassium chloride 10 MEQ tablet Commonly known as:  K-DUR Take 1 tablet (10 mEq total) by mouth 2 (two) times daily.   predniSONE 20 MG tablet Commonly known as:  DELTASONE 2 tabs PO QD x3 days, then 1.5 tab PO QD x3 days, then 1 tab PO QD x3, then 0.5 tab PO QD x3   tiotropium 18 MCG inhalation capsule Commonly known as:  SPIRIVA Place 1 capsule (18 mcg total) into inhaler and inhale daily.         Disposition:  Home.  [facility] support  Discharged Condition: Ricardo Burch has met maximum benefit of inpatient care and is medically stable and cleared for discharge.  Patient is pending follow up as above.      Time spent on disposition:  Greater than 35 minutes.   Signed: Noe Gens, NP-C [facility] Pgr: [phone] Office: [phone]

## 2016-07-30 NOTE — Hospital Discharge Follow-Up (Signed)
Met with the patient today and reminded him of his appointment at the [facility] on 08/06/16. Also reminded him that the [facility] CM will be contacting him in the next 24-48 hours to check on his status and review his medications. He was agreeable to the phone call and stated that he would be at his appointment. Reminded him that transportation can be provided to the clinic if needed. He said that a friend will be picking him up this afternoon and he will be returning to the house that he has been living at.  He noted that he is considering relocating to the [place] area to be close to a friend.  He also stated that he still needs to check with the landlords for approval to have home health services and/or [facility].    Update provided to Olga Coaster, RN CM regarding his need to discuss home services with his landlord.

## 2016-07-30 NOTE — Progress Notes (Signed)
Nutrition Follow-up  DOCUMENTATION CODES:   Obesity unspecified  INTERVENTION:  Encourage general healthful PO intake; discussed low sodium/heart healthy guidelines   NUTRITION DIAGNOSIS:   Inadequate oral intake related to inability to eat as evidenced by NPO status.  Discontinued/Resolved  GOAL:   Patient will meet greater than or equal to 90% of their needs  Being met  MONITOR:   PO intake, Skin, I & O's, Labs  REASON FOR ASSESSMENT:   Ventilator    ASSESSMENT:   55 y/o male with polysubstance abuse and COPD exacerbation intubated for hypercarbia on 9/23.   Pt extubated on 9/24 and diet was advanced later that day. Pt states that his appetite is good and he is eating very well, 100% of meals. RD answered pt's questions regarding heart healthy eating. RD encouraged intake of lean protein, fruits, and vegetables with every meal. Pt denies any additional nutrition needs at this time.   Labs: low chloride, low calcium  Diet Order:  Diet Heart Room service appropriate? Yes; Fluid consistency: Thin Diet - low sodium heart healthy  Skin:  Reviewed, no issues  Last BM:  9/24  Height:   Ht Readings from Last 1 Encounters:  07/29/16 _0  (1.702 m)    Weight:   Wt Readings from Last 1 Encounters:  07/30/16 223 lb (101.2 kg)    Ideal Body Weight:  67 kg  BMI:  Body mass index is 34.93 kg/m.  Estimated Nutritional Needs:   Kcal:  2100-2400  Protein:  110-130 grams  Fluid:  2.4 L/day  EDUCATION NEEDS:   No education needs identified at this time  [place], CSP, LDN Inpatient Clinical Dietitian Pager: [phone] After Hours Pager: [phone]

## 2016-07-30 NOTE — Progress Notes (Signed)
Discharge Note:  Patient alert and oriented X 4 and in no distress. Patient given discharge instructions regarding signs and symptoms to report, medications, diet, activity, and upcoming appointments.  He verbalized understanding of all instructions. Telemetry and peripheral IVs discontinued. Patient confirmed that he had all of his personal belongings except his dentures.  Patient can't  remember if he brought his dentures to the hospital or not.  He stated he will call if he does not find them at home. Patient transported out via wheelchair by NT.

## 2016-07-30 NOTE — Hospital Discharge Follow-Up (Signed)
Per Katherina Right, [facility] Pharm[facility] Tech, the patient does not have another primary insurance, he only has medicaid.

## 2016-07-31 ENCOUNTER — Telehealth: Payer: Self-pay | Admitting: Family Medicine

## 2016-07-31 ENCOUNTER — Telehealth: Payer: Self-pay

## 2016-07-31 DIAGNOSIS — J438 Other emphysema: Secondary | ICD-10-CM

## 2016-07-31 NOTE — Telephone Encounter (Signed)
Pt came into office requesting oxygen monitor, please f/up

## 2016-07-31 NOTE — Telephone Encounter (Signed)
[facility]itional [facility] Clinic Post-discharge Follow-Up Phone Call:  Date of Discharge: 07/30/16 Principal Discharge Diagnosis(es): Acute on chronic hypercapnic respiratory failure, COPD exacerbation  Call Completed: Yes                    With Whom: Patient   Please check all that apply:  X  Patient is knowledgeable of his/her condition(s) and/or treatment. X  Patient is caring for self at home.  ? Patient is receiving assist at home from family and/or caregiver. Family and/or caregiver is knowledgeable of patient's condition(s) and/or treatment. ? Patient is receiving home health services. If so, name of agency.     Medication Reconciliation:  ? Medication list reviewed with patient. X  Patient obtained all discharge medications-No. Patient indicated he was on his way to pharmacy at [facility] to pick-up discharge medications. Discharge medications thoroughly reviewed with patient. Patient appreciative of information.   Activities of Daily Living:  X  Independent-uses home oxygen (4L continuous), CPAP. DME supplier is [employer]. ? Needs assist  ? [employer]    Community resources in place for patient:  X Provided patient with information about [employer], [employer], [employer]. Patient must move out of his apartment on 08/01/16 and will be in-between housing so unable to discuss home care further at this time. ? Home Health/Home DME ? Assisted Living ? Support Group        Questions/Concerns discussed:This Case Manager placed call to patient to complete discharge follow-up phone call. Inquired about patient's status. Patient denied shortness of breath. Patient denied health concerns at this time.      Patient indicated when he returned to his apartment after discharge his landlord informed him he would have to move out on 08/01/16. Patient uncertain why he has to move and indicated he was up to date on rent.  Informed patient he would benefit from contacting [employer] and [employer] to determine alternate housing options. Also encouraged patient to contact friends or family to determine if he can stay with them while he is in between housing. Patient indicated he did not have anyone he could stay with at this time.      This Case Manager placed call to [facility] to determine if they expect to have availability tomorrow if patient needing shelter. Also wanted to determine if they accept patients with oxygen, CPAP. Was informed they do not currently have a male bed available. Beds available on "first come, first serve" basis; however, oxygen, CPAP allowed.  Call also placed to [facility] in [place], [place] to determine if they anticipate bed availability tomorrow and if they accept patients with oxygen and CPAP. Unable to reach Health visitor, Will; voicemail left requesting return call. Patient updated and patient appreciative.    Discussed importance of daily weights and discussed patient getting a scale. Patient verbalized understanding. Informed patient to begin keeping a log of daily weights and to contact clinic if he has a 3 pound weight gain in 24 hours or 5 pounds in one week.  Patient verbalized understanding.     Reminded patient of Transitional Care Clinic appointment on 08/06/16 at 1400. Patient aware of appointment and indicated he would have transportation to his appointment.

## 2016-08-01 ENCOUNTER — Telehealth: Payer: Self-pay

## 2016-08-01 NOTE — Telephone Encounter (Signed)
This Case Manager spoke with Ricardo Burch, SW and informed her that patient informed this Case Manager on 07/31/16 that he was having to move out of his apartment on 08/01/16. Informed her that patient stated he was uncertain why he had to move and indicated he was up to date on rent. Ricardo Burch, SW indicated she would contact patient to provide resource so he could determine his tenant rights. Spoke with Ricardo Burch, SW who indicated she had tried to contact patient x 2 today, but had been unable to reach. This Case Manager also placed call to patient, but unable to reach. HIPPA compliant voicemail left requesting patient return call.

## 2016-08-05 ENCOUNTER — Telehealth: Payer: Self-pay

## 2016-08-05 NOTE — Telephone Encounter (Signed)
Call placed to the patient to remind him of his appointment at the [facility] tomorrow, 08/06/16. He stated that he needs to cancel the appointment because he is now staying with a friend in the [place], [place] area. He reported that he is " feeling better" and is trying to get "set up" in [place]. He said that he has been in [place] all of his life but thinks the change of living in [place] will be a good move. He said that he understands that if he is staying in [place] he needs to go to the local DSS office to have his medicaid changed from [facility] and he needs to establish care with a new PCP. He said that he has his O2 and CPAP and all of his medications except he is running low on  symbicort.  He stated that he is going to call the pulmonologist office to request a refill for the symbicort. He also confirmed that he has the phone # for [facility] if he has any questions about his O2 and CPAP.  He was agreeable to having this CM call him next week to confirm that he is staying in [place] and will not need to follow up at the [facility].

## 2016-08-06 ENCOUNTER — Inpatient Hospital Stay: Payer: Medicaid Other | Admitting: Family Medicine

## 2016-08-06 ENCOUNTER — Telehealth: Payer: Self-pay | Admitting: Pulmonary Disease

## 2016-08-06 MED ORDER — PULSE OXIMETER FOR FINGER MISC: 1.0000 | 0 refills | Status: DC | PRN

## 2016-08-06 NOTE — Telephone Encounter (Signed)
Please inform patient Rx for pulse ox ready for pick up

## 2016-08-06 NOTE — Telephone Encounter (Signed)
Spoke with [facility] and she states that pt has scheduled and rescheduled POC eval several times. She states that pt is currently scheduled for 10/11 but she is going to call him and see if he would like to have it done today. She states that current order is fine and no new order is needed at this time. She will explain to pt. Nothing further needed.

## 2016-08-07 NOTE — Telephone Encounter (Signed)
Pt was called and informed of script for pulse oximeter on 10/04.

## 2016-08-12 ENCOUNTER — Telehealth: Payer: Self-pay

## 2016-08-12 NOTE — Telephone Encounter (Signed)
Attempted to contact the patient to inquire if he is planning to stay in [place], [place] or if he will be returning to [place], [place] and would like to schedule a follow up appointment at [facility].  Call placed to # [phone] (M) and a HIPAA compliant voicemail message was left requesting a call back to # [phone] or [phone].

## 2016-08-14 ENCOUNTER — Telehealth: Payer: Self-pay | Admitting: Pulmonary Disease

## 2016-08-14 MED ORDER — BUDESONIDE-FORMOTEROL FUMARATE 80-4.5 MCG/ACT IN AERO: 2.0000 | INHALATION_SPRAY | Freq: Two times a day (BID) | RESPIRATORY_TRACT | 0 refills | Status: DC

## 2016-08-14 NOTE — Telephone Encounter (Signed)
2 samples for pt placed up front for pick up Documented per protocol LMOM TCB x1 to inform pt

## 2016-08-14 NOTE — Telephone Encounter (Signed)
Noted by triage, thank you. 

## 2016-08-14 NOTE — Telephone Encounter (Signed)
Patient called back advised samples up front to pick up -pr

## 2016-08-16 ENCOUNTER — Telehealth: Payer: Self-pay

## 2016-08-16 NOTE — Telephone Encounter (Signed)
This Case Manager placed call to patient to determine if he will be staying in [place], [place] or if he will be returning to [place], [place] and would like to schedule a follow-up appointment at [facility]. Call placed to [phone]; unable to reach patient. HIPPA compliant voicemail left requesting return call.

## 2016-08-19 NOTE — Telephone Encounter (Signed)
na

## 2016-08-20 ENCOUNTER — Telehealth: Payer: Self-pay

## 2016-08-20 NOTE — Telephone Encounter (Signed)
Call placed to the patient to inquire if he was in need of a hospital follow up appointment at [facility] or if he was going to stay in [place].  He said that he has been back and forth between [place] and [place] but will be staying in [place] for now. He doesn't think he wants to live in [place].  Instructed him to identify a primary care provider where he will be living to insure that he will receive on-going medical care.

## 2016-08-21 ENCOUNTER — Ambulatory Visit (INDEPENDENT_AMBULATORY_CARE_PROVIDER_SITE_OTHER): Payer: Medicaid Other | Admitting: Pulmonary Disease

## 2016-08-21 ENCOUNTER — Encounter: Payer: Self-pay | Admitting: Pulmonary Disease

## 2016-08-21 VITALS — BP 118/74 | HR 109 | Ht 67.0 in | Wt 224.6 lb

## 2016-08-21 DIAGNOSIS — G4733 Obstructive sleep apnea (adult) (pediatric): Secondary | ICD-10-CM

## 2016-08-21 DIAGNOSIS — J439 Emphysema, unspecified: Secondary | ICD-10-CM | POA: Diagnosis not present

## 2016-08-21 NOTE — Patient Instructions (Signed)
Continue using your inhalers as prescribed. Please see his CPAP regularly every day. Return to clinic in 3 months

## 2016-08-21 NOTE — Progress Notes (Signed)
Ricardo Burch    [id number]    January 05, 1961  Primary Care Physician:FUNCHES, Lennox Laity, MD  Referring Physician: Boykin Nearing, MD [address] [place], [place] [address]  Chief complaint:   Follow up for Severe COPD Severe OSA on autoset Active smoker  HPI: Ricardo Burch is a 55 year old active smoker, COPD. He's had multiple hospitalizations with COPD, CHF exacerbations in the past. His has become homeless in May 2017 and admits to excessive alcohol, drug use, dietary indiscretion since then. He is currently living in the basement of his friend's home.    He was hospitalized from 07/26/16-07/30/16 with acute pulmonary edema, acute exacerbation of COPD in the setting of alcohol, cocaine use, noncompliance with medications. He was intubated briefly for hypercarbia. After discharge he claims to be behaving better. No illegal drug use. He is taking his medications on a regular basis. He started using his CPAP regularly. He denies any cough, sputum production, dyspnea, fevers, chills, lower extremity edema now.   Outpatient Encounter Prescriptions as of 08/21/2016  Medication Sig  . albuterol (PROVENTIL HFA;VENTOLIN HFA) 108 (90 Base) MCG/ACT inhaler Inhale 2 puffs into the lungs every 6 (six) hours as needed for wheezing or shortness of breath.  [name] aspirin EC 81 MG tablet Take 1 tablet (81 mg total) by mouth daily.  . budesonide-formoterol (SYMBICORT) 80-4.5 MCG/ACT inhaler Inhale 2 puffs into the lungs 2 (two) times daily.  . budesonide-formoterol (SYMBICORT) 80-4.5 MCG/ACT inhaler Inhale 2 puffs into the lungs 2 (two) times daily.  . diphenhydramine-acetaminophen (TYLENOL PM) 25-500 MG TABS tablet Take 1 tablet by mouth at bedtime as needed.  . furosemide (LASIX) 20 MG tablet Take 1 tablet (20 mg total) by mouth 2 (two) times daily. OV needed for additional refills  . guaiFENesin (MUCINEX) 600 MG 12 hr tablet Take 2 tablets (1,200 mg total) by mouth 2 (two) times daily.  . Misc.  Devices (PULSE OXIMETER FOR FINGER) MISC 1 each by Does not apply route as needed. COPD ICD 10 J44.9  . OXYGEN Inhale 2 L into the lungs continuous.  . potassium chloride (K-DUR) 10 MEQ tablet Take 1 tablet (10 mEq total) by mouth 2 (two) times daily.  [name] tiotropium (SPIRIVA) 18 MCG inhalation capsule Place 1 capsule (18 mcg total) into inhaler and inhale daily.  . [DISCONTINUED] predniSONE (DELTASONE) 20 MG tablet 2 tabs PO QD x3 days, then 1.5 tab PO QD x3 days, then 1 tab PO QD x3, then 0.5 tab PO QD x3 (Patient not taking: Reported on 08/21/2016)   No facility-administered encounter medications on file as of 08/21/2016.     Allergies as of 08/21/2016 - Review Complete 08/21/2016  Allergen Reaction Noted  . Apresoline [hydralazine] Other (See Comments) 07/10/2016  . Hydrocodone Nausea And Vomiting 07/13/2016    Past Medical History:  Diagnosis Date  . CHF (congestive heart failure) ([place])   . Complication of anesthesia    never been put to sleep  . COPD (chronic obstructive pulmonary disease) ([place]) Dx 2015  . Eczema    since childhood   . Sleep apnea    CPAP    Past Surgical History:  Procedure Laterality Date  . MULTIPLE EXTRACTIONS WITH ALVEOLOPLASTY N/A 03/22/2016   Procedure: MULTIPLE EXTRACTION WITH ALVEOLOPLASTY;  Surgeon: Diona Browner, DDS;  Location: [facility];  Service: Oral Surgery;  Laterality: N/A;  . NO PAST SURGERIES      Family History  Problem Relation Age of Onset  . Cerebral  aneurysm Father   . Cancer Neg Hx   . Diabetes Neg Hx   . Heart disease Neg Hx     Social History   Social History  . Marital status: Single    Spouse name: N/A  . Number of children: 4   . Years of education: 12   Occupational History  . Unemployed     Social History Main Topics  . Smoking status: Current Every Day Smoker    Packs/day: 0.50    Years: 30.00    Types: Cigarettes  . Smokeless tobacco: Never Used     Comment: Smokes 3-4 cigarettes a day  . Alcohol use 0.6  oz/week    1 Cans of beer per week     Comment: occassionally  . Drug use:     Types: Marijuana, Cocaine     Comment: used in the last week  . Sexual activity: No     Comment: pt. graduzlly cutting back using chan   Other Topics Concern  . Not on file   Social History Narrative    Lives alone.    4 daughters 35, 37 yo twins, eldest married live in [place].     Review of systems: Review of Systems  Constitutional: Negative for fever and chills.  HENT: Negative.   Eyes: Negative for blurred vision.  Respiratory: as per HPI  Cardiovascular: Negative for chest pain and palpitations.  Gastrointestinal: Negative for vomiting, diarrhea, blood per rectum. Genitourinary: Negative for dysuria, urgency, frequency and hematuria.  Musculoskeletal: Negative for myalgias, back pain and joint pain.  Skin: Negative for itching and rash.  Neurological: Negative for dizziness, tremors, focal weakness, seizures and loss of consciousness.  Endo/Heme/Allergies: Negative for environmental allergies.  Psychiatric/Behavioral: Negative for depression, suicidal ideas and hallucinations.  All other systems reviewed and are negative.   Physical Exam: Blood pressure 126/82, pulse 95, height 5\' 7"  (1.702 m), weight 110.9 kg (244 lb 9.6 oz), SpO2 95 %. Gen:      No acute distress HEENT:  EOMI, sclera anicteric Neck:     No masses; no thyromegaly Lungs:    B/L crackles, no wheeze; normal respiratory effort CV:         Regular rate and rhythm; no murmurs, 1+ edema Abd:      + bowel sounds; soft, non-tender; no palpable masses, no distension Ext:    No edema; adequate peripheral perfusion Skin:      Warm and dry; no rash Neuro: alert and oriented x 3 Psych: normal mood and affect  Data Reviewed: CXR  (07/28/16) No acute cardiopulmonary per Ricardo Burch. Images reviewed.  Echo (09/18/15) The right ventricular systolic pressure was increased consistent with moderate pulmonary hypertension.moderate  concentric LVH. LVEF 60-65 percent. Grade 2 diastolic dysfunction. RV cavity size severe grade dilated. RV systolic function moderately to severely reduced. PA pressure 56  Sleep study 10/22/15 Severe OSA, AHI 151. Started on an AutoSet  CPAP compliance report 06/23/16- 07/22/16 3% usage greater than 4 hours.  PFTs 01/16/16 FVC 2.43 [58%] FEV1 1.49 [45%) F/F 61 TLC 81% DLCO 56% Severe obstruction, moderate reduction in diffusion capacity.  Assessment:  #1 COPD Severe COPD. He is maintained on Spiriva and Symbicort. He'll continue the supplemental oxygen and the increased Lasix dose, monitor his lower extremity edema and weights.   #2 Severe OSA On autoset CPAP with better reported compliance today. I'll get a download at the next visit.  #3 Tobacco use Continues to smoke in spite of attempts to quit.   #  4 Pulmonary hypertension I suspect this is secondary to COPD and OSA. We will reevaluate with repeat echo at next visit.  Plan/Recommendations:0 - Continue symbicort, spiriva - Continue increased dose of lasix. - Monitor LE edema and daily weights  Marshell Garfinkel MD [facility] Pager [phone] 08/21/2016, 2:30 PM  CC: Boykin Nearing, MD   [phone]

## 2016-11-04 ENCOUNTER — Emergency Department (HOSPITAL_COMMUNITY)
Admission: EM | Admit: 2016-11-04 | Discharge: 2016-11-04 | Disposition: A | Discharge status: Home / Self Care | Payer: Medicare Other | Attending: Emergency Medicine | Admitting: Emergency Medicine

## 2016-11-04 ENCOUNTER — Emergency Department (HOSPITAL_COMMUNITY): Payer: Medicare Other

## 2016-11-04 ENCOUNTER — Encounter (HOSPITAL_COMMUNITY): Payer: Self-pay

## 2016-11-04 DIAGNOSIS — I5030 Unspecified diastolic (congestive) heart failure: Secondary | ICD-10-CM | POA: Diagnosis not present

## 2016-11-04 DIAGNOSIS — J449 Chronic obstructive pulmonary disease, unspecified: Secondary | ICD-10-CM | POA: Insufficient documentation

## 2016-11-04 DIAGNOSIS — Z7982 Long term (current) use of aspirin: Secondary | ICD-10-CM | POA: Insufficient documentation

## 2016-11-04 DIAGNOSIS — F17210 Nicotine dependence, cigarettes, uncomplicated: Secondary | ICD-10-CM | POA: Insufficient documentation

## 2016-11-04 DIAGNOSIS — R079 Chest pain, unspecified: Secondary | ICD-10-CM | POA: Diagnosis not present

## 2016-11-04 DIAGNOSIS — I110 Hypertensive heart disease with heart failure: Secondary | ICD-10-CM | POA: Diagnosis not present

## 2016-11-04 LAB — CBC
HEMATOCRIT: 46.4 % (ref 39.0–52.0)
Hemoglobin: 15.6 g/dL (ref 13.0–17.0)
MCH: 32 pg (ref 26.0–34.0)
MCHC: 33.6 g/dL (ref 30.0–36.0)
MCV: 95.1 fL (ref 78.0–100.0)
Platelets: 269 10*3/uL (ref 150–400)
RBC: 4.88 MIL/uL (ref 4.22–5.81)
RDW: 14.3 % (ref 11.5–15.5)
WBC: 11.8 10*3/uL — ABNORMAL HIGH (ref 4.0–10.5)

## 2016-11-04 LAB — BASIC METABOLIC PANEL
Anion gap: 10 (ref 5–15)
BUN: 10 mg/dL (ref 6–20)
CO2: 34 mmol/L — ABNORMAL HIGH (ref 22–32)
CREATININE: 0.92 mg/dL (ref 0.61–1.24)
Calcium: 8.7 mg/dL — ABNORMAL LOW (ref 8.9–10.3)
Chloride: 92 mmol/L — ABNORMAL LOW (ref 101–111)
GFR calc Af Amer: >60 mL/min (ref >60)
GLUCOSE: 105 mg/dL — AB (ref 65–99)
POTASSIUM: 3.1 mmol/L — AB (ref 3.5–5.1)
Sodium: 136 mmol/L (ref 135–145)

## 2016-11-04 LAB — I-STAT TROPONIN, ED: Troponin i, poc: 0.01 ng/mL (ref 0.00–0.08)

## 2016-11-04 MED ORDER — POTASSIUM CHLORIDE CRYS ER 20 MEQ PO TBCR
40.0000 meq | EXTENDED_RELEASE_TABLET | Freq: Once | ORAL | Status: AC
  Administered 2016-11-04: 40 meq via ORAL
  Filled 2016-11-04: qty 2

## 2016-11-04 NOTE — ED Triage Notes (Signed)
Pt presents with c/o left chest pain that started last night. Pt reports the pain is aching in nature. Pt denies any shortness of breath at this time but he reports he is on O2 at home, 2L currently and feels short of breath off of his O2. Pt is 89% on his O2 at this time. Pt also reports that he smoked weed last night and thinks there was "something in it".

## 2016-11-04 NOTE — ED Notes (Signed)
ED Provider at bedside. 

## 2016-11-04 NOTE — Discharge Instructions (Signed)
Take your regular medications.  Avoid using illegal substances.  Call your Dr. for a follow-up appointment for a checkup, next week.

## 2016-11-04 NOTE — ED Provider Notes (Addendum)
[facility] DEPT Provider Note   CSN: [id number] Arrival date & time: 11/04/16  [id number]     History   Chief Complaint Chief Complaint  Patient presents with  . Chest Pain    HPI Ricardo Burch is a 56 y.o. male.  He states that he has had a aching chest pain, which started during the night, and made it hard to sleep, so he came in today for evaluation. He admits to "smoking a blunt with cocaine", yesterday. He denies fever, chills, cough, change in his chronic dyspnea, nausea, vomiting, weakness or dizziness.   HPI  Past Medical History:  Diagnosis Date  . CHF (congestive heart failure) ([place])   . Complication of anesthesia    never been put to sleep  . COPD (chronic obstructive pulmonary disease) ([place]) Dx 2015  . Eczema    since childhood   . Sleep apnea    CPAP    Patient Active Problem List   Diagnosis Date Noted  . Acute encephalopathy   . Cocaine abuse 07/26/2016  . Depression 07/26/2016  . OSA on CPAP 07/26/2016  . CHF (congestive heart failure) ([place]) 06/27/2016  . Alcohol abuse 06/27/2016  . Acute diastolic (congestive) heart failure 06/27/2016  . ARF (acute renal failure) ([place])   . Acute respiratory failure ([place]) 09/16/2015  . Elevated BP 08/24/2015  . COPD exacerbation ([place]) 08/24/2015  . Left elbow contusion 05/26/2015  . Healthcare maintenance 05/26/2015  . COPD (chronic obstructive pulmonary disease) ([place]) 08/23/2014  . Diastolic CHF ([place]) [id number]  . Eczema 08/23/2014  . Right knee pain 08/23/2014  . Tobacco abuse 03/27/2014    Past Surgical History:  Procedure Laterality Date  . MULTIPLE EXTRACTIONS WITH ALVEOLOPLASTY N/A 03/22/2016   Procedure: MULTIPLE EXTRACTION WITH ALVEOLOPLASTY;  Surgeon: Diona Browner, DDS;  Location: [facility];  Service: Oral Surgery;  Laterality: N/A;  . NO PAST SURGERIES         Home Medications    Prior to Admission medications   Medication Sig Start Date End Date Taking? Authorizing Provider  ADVAIR DISKUS 250-50  MCG/DOSE AEPB Inhale 1 puff into the lungs 2 (two) times daily. 11/01/16  Yes Historical Provider, MD  albuterol (PROVENTIL HFA;VENTOLIN HFA) 108 (90 Base) MCG/ACT inhaler Inhale 2 puffs into the lungs every 6 (six) hours as needed for wheezing or shortness of breath. 06/29/16  Yes Robbie Lis, MD  amLODipine (NORVASC) 5 MG tablet Take 5 mg by mouth daily. 10/15/16  Yes Historical Provider, MD  aspirin EC 81 MG tablet Take 1 tablet (81 mg total) by mouth daily. 09/20/15  Yes Florencia Reasons, MD  furosemide (LASIX) 20 MG tablet Take 1 tablet (20 mg total) by mouth 2 (two) times daily. OV needed for additional refills 07/30/16  Yes Donita Brooks, NP  guaiFENesin (MUCINEX) 600 MG 12 hr tablet Take 2 tablets (1,200 mg total) by mouth 2 (two) times daily. 09/20/15  Yes Florencia Reasons, MD  HYDROcodone-acetaminophen (NORCO/VICODIN) 5-325 MG tablet Take 1 tablet by mouth 3 (three) times daily as needed. for pain 10/30/16  Yes Historical Provider, MD  potassium chloride (K-DUR) 10 MEQ tablet Take 1 tablet (10 mEq total) by mouth 2 (two) times daily. 07/30/16  Yes Donita Brooks, NP  SYMBICORT 160-4.5 MCG/ACT inhaler Inhale 2 puffs into the lungs 2 (two) times daily. 10/07/16  Yes Historical Provider, MD  tiotropium (SPIRIVA) 18 MCG inhalation capsule Place 1 capsule (18 mcg total) into inhaler and inhale daily. 05/10/16  Yes Boykin Nearing, MD  DALIRESP 500 MCG TABS tablet Take 500 mcg by mouth daily. 11/01/16   Historical Provider, MD  Misc. Devices (PULSE OXIMETER FOR FINGER) MISC 1 each by Does not apply route as needed. COPD ICD 10 J44.9 08/06/16   Boykin Nearing, MD  OXYGEN Inhale 2 L into the lungs continuous.    Historical Provider, MD    Family History Family History  Problem Relation Age of Onset  . Cerebral aneurysm Father   . Cancer Neg Hx   . Diabetes Neg Hx   . Heart disease Neg Hx     Social History Social History  Substance Use Topics  . Smoking status: Current Every Day Smoker    Packs/day: 0.50     Years: 30.00    Types: Cigarettes  . Smokeless tobacco: Never Used     Comment: Smokes 3-4 cigarettes a day  . Alcohol use 0.6 oz/week    1 Cans of beer per week     Comment: occassionally     Allergies   Apresoline [hydralazine]   Review of Systems Review of Systems  All other systems reviewed and are negative.    Physical Exam Updated Vital Signs BP 121/74   Pulse 83   Temp 97.9 F (36.6 C) (Oral)   Resp 17   SpO2 96%   Physical Exam  Constitutional: He is oriented to person, place, and time. He appears well-developed and well-nourished.  HENT:  Head: Normocephalic and atraumatic.  Right Ear: External ear normal.  Left Ear: External ear normal.  Eyes: Conjunctivae and EOM are normal. Pupils are equal, round, and reactive to light.  Neck: Normal range of motion and phonation normal. Neck supple.  Cardiovascular: Normal rate, regular rhythm and normal heart sounds.   Pulmonary/Chest: Effort normal and breath sounds normal. He exhibits no bony tenderness.  Abdominal: Soft. There is no tenderness.  Musculoskeletal: Normal range of motion.  Neurological: He is alert and oriented to person, place, and time. No cranial nerve deficit or sensory deficit. He exhibits normal muscle tone. Coordination normal.  Skin: Skin is warm, dry and intact.  Psychiatric: He has a normal mood and affect. His behavior is normal. Judgment and thought content normal.  Nursing note and vitals reviewed.    ED Treatments / Results  Labs (all labs ordered are listed, but only abnormal results are displayed) Labs Reviewed  BASIC METABOLIC PANEL - Abnormal; Notable for the following:       Result Value   Potassium 3.1 (*)    Chloride 92 (*)    CO2 34 (*)    Glucose, Bld 105 (*)    Calcium 8.7 (*)    All other components within normal limits  CBC - Abnormal; Notable for the following:    WBC 11.8 (*)    All other components within normal limits  I-STAT TROPOININ, ED    EKG  EKG  Interpretation  Date/Time:  Monday November 04 2016 08:22:02 EST Ventricular Rate:  101 PR Interval:    QRS Duration: 168 QT Interval:  376 QTC Calculation: 488 R Axis:   118 Text Interpretation:  Sinus tachycardia Probable left atrial enlargement Right bundle branch block Inferior infarct, acute Anteroseptal infarct, age indeterminate Lateral leads are also involved since last tracing no significant change Confirmed by Eulis Foster  MD, Leiani Enright [phone]) on 11/04/2016 8:31:43 AM       Radiology Dg Chest 2 View  Result Date: 11/04/2016 CLINICAL DATA:  New lt sided chest pain since last night, some sob,  states hx of copd, chf, occasional smoker EXAM: CHEST  2 VIEW COMPARISON:  07/28/2016 FINDINGS: Heart is enlarged and stable in configuration. The lungs are free of focal consolidations and pleural effusions. Mild hyperinflation of the lungs. Degenerative changes are seen in thoracic spine. Remote rib fractures are present. IMPRESSION: Stable cardiomegaly.  Stable hyperinflation. Electronically Signed   By: Nolon Nations M.D.   On: 11/04/2016 08:40    Procedures Procedures (including critical care time)  Medications Ordered in ED Medications  potassium chloride SA (K-DUR,KLOR-CON) CR tablet 40 mEq (40 mEq Oral Given 11/04/16 0957)     Initial Impression / Assessment and Plan / ED Course  I have reviewed the triage vital signs and the nursing notes.  Pertinent labs & imaging results that were available during my care of the patient were reviewed by me and considered in my medical decision making (see chart for details).  Clinical Course as of Nov 05 1235  Mon Nov 04, 2016  [id number] Patient, comfortable, does not appear to be having an acute MI at this time. He states he has had intermittent chest pain this morning.  [EW]    Clinical Course User Index [EW] Daleen Bo, MD    Medications  potassium chloride SA (K-DUR,KLOR-CON) CR tablet 40 mEq (40 mEq Oral Given 11/04/16 0957)    Patient  Vitals for the past 24 hrs:  BP Temp Temp src Pulse Resp SpO2  11/04/16 1230 121/74 - - 83 17 96 %  11/04/16 1200 122/88 - - 81 18 96 %  11/04/16 1148 141/91 - - 86 21 96 %  11/04/16 1100 125/77 - - 80 16 96 %  11/04/16 1000 126/75 - - 91 14 95 %  11/04/16 0930 121/86 - - 90 18 94 %  11/04/16 0900 125/78 - - 89 21 94 %  11/04/16 0834 153/93 - - 101 19 93 %  11/04/16 0817 152/99 97.9 F (36.6 C) Oral 99 20 (!) 89 %    12:33 PM Reevaluation with update and discussion. After initial assessment and treatment, an updated evaluation reveals He remains comfortable, vital signs stabilized normal, he has no chest pain at this time and no further complaints. Ming Mcmannis L    Final Clinical Impressions(s) / ED Diagnoses   Final diagnoses:  Nonspecific chest pain   Nonspecific chest pain, with polysubstance abuse, but no evidence for ACS, PE or pneumonia.  Nursing Notes Reviewed/ Care Coordinated Applicable Imaging Reviewed Interpretation of Laboratory Data incorporated into ED treatment  The patient appears reasonably screened and/or stabilized for discharge and I doubt any other medical condition or other [facility] requiring further screening, evaluation, or treatment in the ED at this time prior to discharge.  Plan: Home Medications- continue; Home Treatments- rest; return here if the recommended treatment, does not improve the symptoms; Recommended follow up- PCP prn   New Prescriptions New Prescriptions   No medications on file     Daleen Bo, MD 11/04/16 [place], MD 11/11/16 [phone]

## 2016-11-21 ENCOUNTER — Ambulatory Visit: Payer: Medicaid Other | Admitting: Pulmonary Disease

## 2016-11-25 ENCOUNTER — Telehealth: Payer: Self-pay | Admitting: Pulmonary Disease

## 2016-11-25 MED ORDER — BUDESONIDE-FORMOTEROL FUMARATE 160-4.5 MCG/ACT IN AERO: 2.0000 | INHALATION_SPRAY | Freq: Two times a day (BID) | RESPIRATORY_TRACT | 0 refills | Status: DC

## 2016-11-25 NOTE — Telephone Encounter (Signed)
lmtcb X1 

## 2016-11-25 NOTE — Telephone Encounter (Signed)
Pt requesting samples of symbicort.  These have been left up front.  Nothing further needed.

## 2016-11-25 NOTE — Telephone Encounter (Signed)
Patient missed call, CB is [phone].

## 2016-12-12 ENCOUNTER — Ambulatory Visit (INDEPENDENT_AMBULATORY_CARE_PROVIDER_SITE_OTHER): Payer: Medicare Other | Admitting: Pulmonary Disease

## 2016-12-12 ENCOUNTER — Encounter: Payer: Self-pay | Admitting: Pulmonary Disease

## 2016-12-12 VITALS — BP 138/82 | HR 83 | Ht 67.0 in | Wt 226.0 lb

## 2016-12-12 DIAGNOSIS — J439 Emphysema, unspecified: Secondary | ICD-10-CM | POA: Diagnosis not present

## 2016-12-12 DIAGNOSIS — G4733 Obstructive sleep apnea (adult) (pediatric): Secondary | ICD-10-CM | POA: Diagnosis not present

## 2016-12-12 MED ORDER — FLUTICASONE-SALMETEROL 250-50 MCG/DOSE IN AEPB: 1.0000 | INHALATION_SPRAY | Freq: Two times a day (BID) | RESPIRATORY_TRACT | 0 refills | Status: DC

## 2016-12-12 MED ORDER — TIOTROPIUM BROMIDE MONOHYDRATE 1.25 MCG/ACT IN AERS: 2.0000 | INHALATION_SPRAY | Freq: Every day | RESPIRATORY_TRACT | 0 refills | Status: DC

## 2016-12-12 NOTE — Addendum Note (Signed)
Addended by: Maryanna Shape A on: 12/12/2016 05:18 PM   Modules accepted: Orders

## 2016-12-12 NOTE — Addendum Note (Signed)
Addended by: Maryanna Shape A on: 12/12/2016 05:13 PM   Modules accepted: Orders

## 2016-12-12 NOTE — Progress Notes (Signed)
Ricardo Burch    [id number]    Dec 05, 1960  Primary Care Physician:FUNCHES, Ricardo Laity, MD  Referring Physician: Boykin Nearing, MD [address] [place], [place] [address]  Chief complaint:   Follow up for Severe COPD Severe OSA on autoset Active smoker  HPI: [name] is a 56 year old active smoker, COPD. He's had multiple hospitalizations with COPD, CHF exacerbations in the past. His has become homeless in May 2017 and admits to excessive alcohol, drug use, dietary indiscretion since then. He is currently living in the basement of his friend's home.    He was hospitalized from 07/26/16-07/30/16 with acute pulmonary edema, acute exacerbation of COPD in the setting of alcohol, cocaine use, noncompliance with medications. He was intubated briefly for hypercarbia. After discharge he claims to be behaving better. No illegal drug use. He is taking his medications on a regular basis. He denies any cough, sputum production, dyspnea, fevers, chills, lower extremity edema now.  Interim History: He has been seen by Dr. Edison Nasuti, Pulmonologist at [facility] and was given samples of inhalers, Advair and continues on the Spiriva. He underwent pulmonary rehabilitation over the past month. He still continues to smoke. He is not using the CPAP on a regular basis as the straps are broken.  Outpatient Encounter Prescriptions as of 12/12/2016  Medication Sig  . ADVAIR DISKUS 250-50 MCG/DOSE AEPB Inhale 1 puff into the lungs 2 (two) times daily.  [name] albuterol (PROVENTIL HFA;VENTOLIN HFA) 108 (90 Base) MCG/ACT inhaler Inhale 2 puffs into the lungs every 6 (six) hours as needed for wheezing or shortness of breath.  [name] amLODipine (NORVASC) 5 MG tablet Take 5 mg by mouth daily.  [name] aspirin EC 81 MG tablet Take 1 tablet (81 mg total) by mouth daily.  . budesonide-formoterol (SYMBICORT) 160-4.5 MCG/ACT inhaler Inhale 2 puffs into the lungs 2 (two) times daily.  [name] DALIRESP 500 MCG TABS tablet Take  500 mcg by mouth daily.  . furosemide (LASIX) 20 MG tablet Take 1 tablet (20 mg total) by mouth 2 (two) times daily. OV needed for additional refills  . HYDROcodone-acetaminophen (NORCO/VICODIN) 5-325 MG tablet Take 1 tablet by mouth 3 (three) times daily as needed. for pain  . Misc. Devices (PULSE OXIMETER FOR FINGER) MISC 1 each by Does not apply route as needed. COPD ICD 10 J44.9  . OXYGEN Inhale 2 L into the lungs continuous.  . potassium chloride (K-DUR) 10 MEQ tablet Take 1 tablet (10 mEq total) by mouth 2 (two) times daily.  [name] tiotropium (SPIRIVA) 18 MCG inhalation capsule Place 1 capsule (18 mcg total) into inhaler and inhale daily.  . [DISCONTINUED] SYMBICORT 160-4.5 MCG/ACT inhaler Inhale 2 puffs into the lungs 2 (two) times daily.  . [DISCONTINUED] guaiFENesin (MUCINEX) 600 MG 12 hr tablet Take 2 tablets (1,200 mg total) by mouth 2 (two) times daily. (Patient not taking: Reported on 12/12/2016)   No facility-administered encounter medications on file as of 12/12/2016.     Allergies as of 12/12/2016 - Review Complete 12/12/2016  Allergen Reaction Noted  . Apresoline [hydralazine] Other (See Comments) 07/10/2016    Past Medical History:  Diagnosis Date  . CHF (congestive heart failure) ([place])   . Complication of anesthesia    never been put to sleep  . COPD (chronic obstructive pulmonary disease) ([place]) Dx 2015  . Eczema    since childhood   . Sleep apnea    CPAP    Past Surgical History:  Procedure  Laterality Date  . MULTIPLE EXTRACTIONS WITH ALVEOLOPLASTY N/A 03/22/2016   Procedure: MULTIPLE EXTRACTION WITH ALVEOLOPLASTY;  Surgeon: Diona Browner, DDS;  Location: [facility];  Service: Oral Surgery;  Laterality: N/A;  . NO PAST SURGERIES      Family History  Problem Relation Age of Onset  . Cerebral aneurysm Father   . Cancer Neg Hx   . Diabetes Neg Hx   . Heart disease Neg Hx     Social History   Social History  . Marital status: Single    Spouse name: N/A  . Number of  children: 4   . Years of education: 12   Occupational History  . Unemployed     Social History Main Topics  . Smoking status: Current Every Day Smoker    Packs/day: 0.50    Years: 30.00    Types: Cigarettes  . Smokeless tobacco: Never Used     Comment: Smokes 1-2 cigarettes a day  . Alcohol use 0.6 oz/week    1 Cans of beer per week     Comment: occassionally  . Drug use: Yes    Types: Marijuana, Cocaine     Comment: used in the last week  . Sexual activity: No     Comment: pt. graduzlly cutting back using chan   Other Topics Concern  . Not on file   Social History Narrative    Lives alone.    4 daughters 1, 11 yo twins, eldest married live in [place].     Review of systems: Review of Systems  Constitutional: Negative for fever and chills.  HENT: Negative.   Eyes: Negative for blurred vision.  Respiratory: as per HPI  Cardiovascular: Negative for chest pain and palpitations.  Gastrointestinal: Negative for vomiting, diarrhea, blood per rectum. Genitourinary: Negative for dysuria, urgency, frequency and hematuria.  Musculoskeletal: Negative for myalgias, back pain and joint pain.  Skin: Negative for itching and rash.  Neurological: Negative for dizziness, tremors, focal weakness, seizures and loss of consciousness.  Endo/Heme/Allergies: Negative for environmental allergies.  Psychiatric/Behavioral: Negative for depression, suicidal ideas and hallucinations.  All other systems reviewed and are negative.   Physical Exam: Blood pressure 138/82, pulse 83, height 5\' 7"  (1.702 m), weight 102.5 kg (226 lb), SpO2 91 %. Gen:      No acute distress HEENT:  EOMI, sclera anicteric Neck:     No masses; no thyromegaly Lungs:    Clear to auscultation bilaterally; normal respiratory effort CV:         Regular rate and rhythm; no murmurs Abd:      + bowel sounds; soft, non-tender; no palpable masses, no distension Ext:    No edema; adequate peripheral perfusion Skin:       Warm and dry; no rash Neuro: alert and oriented x 3 Psych: normal mood and affect  Data Reviewed: CXR  (07/28/16) No acute cardiopulmonary process Images reviewed. CXR 11/04/16 Stable hyperinflation. Images reviewed.  Echo (09/18/15) The right ventricular systolic pressure was increased consistent with moderate pulmonary hypertension.moderate concentric LVH. LVEF 60-65 percent. Grade 2 diastolic dysfunction. RV cavity size severe grade dilated. RV systolic function moderately to severely reduced. PA pressure 56  Sleep study 10/22/15 Severe OSA, AHI 151. Started on an AutoSet  CPAP compliance report 06/23/16- 07/22/16 3% usage greater than 4 hours.  PFTs 01/16/16 FVC 2.43 [58%] FEV1 1.49 [45%) F/F 61 TLC 81% DLCO 56% Severe obstruction, moderate reduction in diffusion capacity.  Assessment:  #1 COPD Severe COPD. He is maintained  on Spiriva and Advair (previously on symbicort).  He cannot afford his medications and is requesting samples. Will continue the supplemental oxygen. He completed pulmonary rehabilitation.   #2 Severe OSA On autoset CPAP did not using due to issues with mask. We'll get him a new mask for placement.  #3 Tobacco use He continues to smoke in spite of attempts to quit.   #4 Pulmonary hypertension Likely secondary to COPD and OSA. We will continue to monitor.  Plan/Recommendations:0 - Continue advair, spiriva - Mask replacement for CPAP - Continue O2 supplementation  Marshell Garfinkel MD [facility] Pager [phone] 12/12/2016, 4:28 PM  CC: Boykin Nearing, MD

## 2016-12-12 NOTE — Patient Instructions (Signed)
We will see if we have samples of Advair and Spiriva to give you We'll check with the DME company to get the CPAP fixed with a new mask.

## 2016-12-13 DIAGNOSIS — I10 Essential (primary) hypertension: Secondary | ICD-10-CM | POA: Diagnosis not present

## 2016-12-13 DIAGNOSIS — M542 Cervicalgia: Secondary | ICD-10-CM | POA: Diagnosis not present

## 2016-12-13 DIAGNOSIS — I5030 Unspecified diastolic (congestive) heart failure: Secondary | ICD-10-CM | POA: Diagnosis not present

## 2016-12-13 DIAGNOSIS — J441 Chronic obstructive pulmonary disease with (acute) exacerbation: Secondary | ICD-10-CM | POA: Diagnosis not present

## 2016-12-13 DIAGNOSIS — M5416 Radiculopathy, lumbar region: Secondary | ICD-10-CM | POA: Diagnosis not present

## 2016-12-24 ENCOUNTER — Emergency Department (HOSPITAL_COMMUNITY): Payer: Medicare Other

## 2016-12-24 ENCOUNTER — Encounter (HOSPITAL_COMMUNITY): Payer: Self-pay | Admitting: Emergency Medicine

## 2016-12-24 ENCOUNTER — Emergency Department (HOSPITAL_COMMUNITY)
Admission: EM | Admit: 2016-12-24 | Discharge: 2016-12-24 | Disposition: A | Discharge status: Home / Self Care | Payer: Medicare Other | Attending: Emergency Medicine | Admitting: Emergency Medicine

## 2016-12-24 DIAGNOSIS — J449 Chronic obstructive pulmonary disease, unspecified: Secondary | ICD-10-CM | POA: Insufficient documentation

## 2016-12-24 DIAGNOSIS — I110 Hypertensive heart disease with heart failure: Secondary | ICD-10-CM | POA: Diagnosis not present

## 2016-12-24 DIAGNOSIS — Z7982 Long term (current) use of aspirin: Secondary | ICD-10-CM | POA: Diagnosis not present

## 2016-12-24 DIAGNOSIS — R0602 Shortness of breath: Secondary | ICD-10-CM | POA: Diagnosis present

## 2016-12-24 DIAGNOSIS — J111 Influenza due to unidentified influenza virus with other respiratory manifestations: Secondary | ICD-10-CM | POA: Diagnosis not present

## 2016-12-24 DIAGNOSIS — I509 Heart failure, unspecified: Secondary | ICD-10-CM | POA: Insufficient documentation

## 2016-12-24 DIAGNOSIS — F17210 Nicotine dependence, cigarettes, uncomplicated: Secondary | ICD-10-CM | POA: Diagnosis not present

## 2016-12-24 DIAGNOSIS — Z791 Long term (current) use of non-steroidal anti-inflammatories (NSAID): Secondary | ICD-10-CM | POA: Insufficient documentation

## 2016-12-24 DIAGNOSIS — Z79899 Other long term (current) drug therapy: Secondary | ICD-10-CM | POA: Insufficient documentation

## 2016-12-24 HISTORY — DX: Essential (primary) hypertension: I10

## 2016-12-24 LAB — COMPREHENSIVE METABOLIC PANEL
ALT: 15 U/L — ABNORMAL LOW (ref 17–63)
AST: 26 U/L (ref 15–41)
Albumin: 4 g/dL (ref 3.5–5.0)
Alkaline Phosphatase: 77 U/L (ref 38–126)
Anion gap: 7 (ref 5–15)
BILIRUBIN TOTAL: 1.1 mg/dL (ref 0.3–1.2)
BUN: 7 mg/dL (ref 6–20)
CHLORIDE: 93 mmol/L — AB (ref 101–111)
CO2: 32 mmol/L (ref 22–32)
CREATININE: 0.84 mg/dL (ref 0.61–1.24)
Calcium: 8.4 mg/dL — ABNORMAL LOW (ref 8.9–10.3)
GFR calc Af Amer: >60 mL/min (ref >60)
GLUCOSE: 92 mg/dL (ref 65–99)
Potassium: 4 mmol/L (ref 3.5–5.1)
Sodium: 132 mmol/L — ABNORMAL LOW (ref 135–145)
Total Protein: 7.9 g/dL (ref 6.5–8.1)

## 2016-12-24 LAB — CBC WITH DIFFERENTIAL/PLATELET
Basophils Absolute: 0.1 10*3/uL (ref 0.0–0.1)
Basophils Relative: 1 %
EOS ABS: 0 10*3/uL (ref 0.0–0.7)
EOS PCT: 0 %
HCT: 46.6 % (ref 39.0–52.0)
Hemoglobin: 15.9 g/dL (ref 13.0–17.0)
LYMPHS ABS: 1.8 10*3/uL (ref 0.7–4.0)
Lymphocytes Relative: 28 %
MCH: 33 pg (ref 26.0–34.0)
MCHC: 34.1 g/dL (ref 30.0–36.0)
MCV: 96.7 fL (ref 78.0–100.0)
MONO ABS: 1 10*3/uL (ref 0.1–1.0)
Monocytes Relative: 16 %
Neutro Abs: 3.4 10*3/uL (ref 1.7–7.7)
Neutrophils Relative %: 55 %
PLATELETS: 144 10*3/uL — AB (ref 150–400)
RBC: 4.82 MIL/uL (ref 4.22–5.81)
RDW: 13.5 % (ref 11.5–15.5)
WBC: 6.3 10*3/uL (ref 4.0–10.5)

## 2016-12-24 LAB — URINALYSIS, ROUTINE W REFLEX MICROSCOPIC
Bilirubin Urine: NEGATIVE
GLUCOSE, UA: NEGATIVE mg/dL
Ketones, ur: NEGATIVE mg/dL
LEUKOCYTES UA: NEGATIVE
NITRITE: NEGATIVE
PROTEIN: NEGATIVE mg/dL
Specific Gravity, Urine: 1.004 — ABNORMAL LOW (ref 1.005–1.030)
pH: 6 (ref 5.0–8.0)

## 2016-12-24 LAB — INFLUENZA PANEL BY PCR (TYPE A & B)
Influenza A By PCR: NEGATIVE
Influenza B By PCR: POSITIVE — AB

## 2016-12-24 LAB — I-STAT CG4 LACTIC ACID, ED
LACTIC ACID, VENOUS: 0.82 mmol/L (ref 0.5–1.9)
Lactic Acid, Venous: 0.75 mmol/L (ref 0.5–1.9)

## 2016-12-24 LAB — BRAIN NATRIURETIC PEPTIDE: B Natriuretic Peptide: 20 pg/mL (ref 0.0–100.0)

## 2016-12-24 MED ORDER — OSELTAMIVIR PHOSPHATE 75 MG PO CAPS
75.0000 mg | ORAL_CAPSULE | Freq: Once | ORAL | Status: AC
  Administered 2016-12-24: 75 mg via ORAL
  Filled 2016-12-24: qty 1

## 2016-12-24 MED ORDER — IPRATROPIUM-ALBUTEROL 0.5-2.5 (3) MG/3ML IN SOLN
3.0000 mL | Freq: Once | RESPIRATORY_TRACT | Status: AC
  Administered 2016-12-24: 3 mL via RESPIRATORY_TRACT
  Filled 2016-12-24: qty 3

## 2016-12-24 MED ORDER — OSELTAMIVIR PHOSPHATE 75 MG PO CAPS: 75.0000 mg | ORAL_CAPSULE | Freq: Two times a day (BID) | ORAL | 0 refills | Status: DC

## 2016-12-24 MED ORDER — SODIUM CHLORIDE 0.9 % IV SOLN
1000.0000 mL | INTRAVENOUS | Status: DC
  Administered 2016-12-24: 1000 mL via INTRAVENOUS

## 2016-12-24 MED ORDER — FLUTICASONE PROPIONATE 50 MCG/ACT NA SUSP: 2.0000 | Freq: Every day | NASAL | 2 refills | Status: DC

## 2016-12-24 MED ORDER — PREDNISONE 20 MG PO TABS
40.0000 mg | ORAL_TABLET | Freq: Once | ORAL | Status: AC
  Administered 2016-12-24: 40 mg via ORAL
  Filled 2016-12-24: qty 2

## 2016-12-24 NOTE — ED Triage Notes (Signed)
Pt complaining of nasal congestion, facial pain, states uses 02 at night, yesterday had to start using all day, felt SOB

## 2016-12-24 NOTE — ED Provider Notes (Signed)
[facility] DEPT Provider Note   CSN: [id number] Arrival date & time: 12/24/16  [id number]  By signing my name below, I, Collene Leyden, attest that this documentation has been prepared under the direction and in the presence of Noemi Chapel, MD. Electronically Signed: Collene Leyden, Scribe. 12/24/16. 8:50 AM.  History   Chief Complaint Chief Complaint  Patient presents with  . Shortness of Breath    HPI Comments: Ricardo Burch is a 56 y.o. male with a hx of congestive heart failure, COPD on home oxygen, and sleep apnea on CPAP, who presents to the Emergency Department complaining of sudden-onset shortness of breath that began yesterday afternoon. Patient reports going to the store yesterday, when he returned he became "stuffy". Patient reports becoming short of breath due to nasal congestion. He states this is not chest related. Patient is on 2 liters of nasal cannula. Patient states he only uses his home oxygen at night, he had to use it all day yesterday. Patient has associated nasal congestion, facial pain, chills, fever (100.3 max), occasional cough productive of sputum, and a headache. Patient reports taking mucinex with no relief. Patient does have an albuterol inhaler, but he states he ran out due to moving here a month and a half ago. Patient states he does not know where to go in order to refill this. Patient has not had the flu this year. Patient denies any constant cough or any other symptoms.   The history is provided by the patient. No language interpreter was used.    Past Medical History:  Diagnosis Date  . CHF (congestive heart failure) ([place])   . Complication of anesthesia    never been put to sleep  . COPD (chronic obstructive pulmonary disease) ([place]) Dx 2015  . Eczema    since childhood   . Sleep apnea    CPAP    Patient Active Problem List   Diagnosis Date Noted  . Acute encephalopathy   . Cocaine abuse 07/26/2016  . Depression 07/26/2016  . OSA on CPAP 07/26/2016    . CHF (congestive heart failure) ([place]) 06/27/2016  . Alcohol abuse 06/27/2016  . Acute diastolic (congestive) heart failure 06/27/2016  . ARF (acute renal failure) ([place])   . Acute respiratory failure ([place]) 09/16/2015  . Elevated BP 08/24/2015  . COPD exacerbation ([place]) 08/24/2015  . Left elbow contusion 05/26/2015  . Healthcare maintenance 05/26/2015  . COPD (chronic obstructive pulmonary disease) ([place]) 08/23/2014  . Diastolic CHF ([place]) [id number]  . Eczema 08/23/2014  . Right knee pain 08/23/2014  . Tobacco abuse 03/27/2014    Past Surgical History:  Procedure Laterality Date  . MULTIPLE EXTRACTIONS WITH ALVEOLOPLASTY N/A 03/22/2016   Procedure: MULTIPLE EXTRACTION WITH ALVEOLOPLASTY;  Surgeon: Diona Browner, DDS;  Location: [facility];  Service: Oral Surgery;  Laterality: N/A;  . NO PAST SURGERIES         Home Medications    Prior to Admission medications   Medication Sig Start Date End Date Taking? Authorizing Provider  ADVAIR DISKUS 250-50 MCG/DOSE AEPB Inhale 1 puff into the lungs 2 (two) times daily. 11/01/16   Historical Provider, MD  albuterol (PROVENTIL HFA;VENTOLIN HFA) 108 (90 Base) MCG/ACT inhaler Inhale 2 puffs into the lungs every 6 (six) hours as needed for wheezing or shortness of breath. 06/29/16   Robbie Lis, MD  amLODipine (NORVASC) 5 MG tablet Take 5 mg by mouth daily. 10/15/16   Historical Provider, MD  aspirin EC 81 MG tablet Take 1 tablet (81 mg total)  by mouth daily. 09/20/15   Florencia Reasons, MD  budesonide-formoterol [facility]) 160-4.5 MCG/ACT inhaler Inhale 2 puffs into the lungs 2 (two) times daily. 11/25/16   Praveen Mannam, MD  DALIRESP 500 MCG TABS tablet Take 500 mcg by mouth daily. 11/01/16   Historical Provider, MD  Fluticasone-Salmeterol (ADVAIR DISKUS) 250-50 MCG/DOSE AEPB Inhale 1 puff into the lungs 2 (two) times daily. 12/12/16 12/13/16  Marshell Garfinkel, MD  furosemide (LASIX) 20 MG tablet Take 1 tablet (20 mg total) by mouth 2 (two) times daily. OV needed  for additional refills 07/30/16   Donita Brooks, NP  HYDROcodone-acetaminophen (NORCO/VICODIN) 5-325 MG tablet Take 1 tablet by mouth 3 (three) times daily as needed. for pain 10/30/16   Historical Provider, MD  Misc. Devices (PULSE OXIMETER FOR FINGER) MISC 1 each by Does not apply route as needed. COPD ICD 10 J44.9 08/06/16   Boykin Nearing, MD  OXYGEN Inhale 2 L into the lungs continuous.    Historical Provider, MD  potassium chloride (K-DUR) 10 MEQ tablet Take 1 tablet (10 mEq total) by mouth 2 (two) times daily. 07/30/16   Donita Brooks, NP  tiotropium (SPIRIVA) 18 MCG inhalation capsule Place 1 capsule (18 mcg total) into inhaler and inhale daily. 05/10/16   Josalyn Funches, MD  Tiotropium Bromide Monohydrate (SPIRIVA RESPIMAT) 1.25 MCG/ACT AERS Inhale 2 puffs into the lungs daily. 12/12/16 12/13/16  Marshell Garfinkel, MD    Family History Family History  Problem Relation Age of Onset  . Cerebral aneurysm Father   . Cancer Neg Hx   . Diabetes Neg Hx   . Heart disease Neg Hx     Social History Social History  Substance Use Topics  . Smoking status: Current Every Day Smoker    Packs/day: 0.50    Years: 30.00    Types: Cigarettes  . Smokeless tobacco: Never Used     Comment: Smokes 1-2 cigarettes a day  . Alcohol use 0.6 oz/week    1 Cans of beer per week     Comment: occassionally     Allergies   Apresoline [hydralazine]   Review of Systems Review of Systems  HENT: Positive for congestion.   Respiratory: Positive for shortness of breath.   All other systems reviewed and are negative.    Physical Exam Updated Vital Signs BP 129/79   Pulse 112   Temp 98.8 F (37.1 C) (Oral)   Resp 20   Ht 5\' 7"  (1.702 m)   Wt 226 lb (102.5 kg)   SpO2 91%   BMI 35.40 kg/m   Physical Exam  Constitutional: He is oriented to person, place, and time. He appears well-developed.  HENT:  Head: Normocephalic and atraumatic.  Mouth/Throat: Oropharynx is clear and moist. No oropharyngeal  exudate.  Turbinates swollen bilaterally. OP and TM's clear.   Eyes: Conjunctivae and EOM are normal. Pupils are equal, round, and reactive to light.  Neck: Normal range of motion. Neck supple.  Cardiovascular: Normal rate, regular rhythm and normal heart sounds.  Exam reveals no gallop and no friction rub.   No murmur heard. Pulmonary/Chest: Effort normal and breath sounds normal. He has no wheezes. He has no rales.  SATs of unlabored 92%.   Abdominal: Soft.  Musculoskeletal: Normal range of motion.  Neurological: He is alert and oriented to person, place, and time.  Skin: Skin is warm and dry.  Psychiatric: He has a normal mood and affect.     ED Treatments / Results  DIAGNOSTIC STUDIES: Oxygen  Saturation is 91% on RA, low by my interpretation.    COORDINATION OF CARE: 8:52 AM Discussed treatment plan with pt at bedside and pt agreed to plan, which includes Flonase and a breathing treatment.   Labs (all labs ordered are listed, but only abnormal results are displayed) Labs Reviewed - No data to display  EKG  EKG Interpretation  Date/Time:  Tuesday December 24 2016 10:38:51 EST Ventricular Rate:  85 PR Interval:    QRS Duration: 170 QT Interval:  388 QTC Calculation: 462 R Axis:   108 Text Interpretation:  Sinus rhythm Borderline prolonged PR interval Right bundle branch block Baseline wander in lead(s) V1 V2 V6 since last tracing no significant change Confirmed by Shakirra Buehler  MD, Cabrina Shiroma ([id number]) on 12/24/2016 10:49:35 AM       Radiology No results found.  Procedures Procedures (including critical care time)  Medications Ordered in ED Medications - No data to display   Initial Impression / Assessment and Plan / ED Course  I have reviewed the triage vital signs and the nursing notes.  Pertinent labs & imaging results that were available during my care of the patient were reviewed by me and considered in my medical decision making (see chart for details).     The  patient is in no distress, oxygenation of 92% on 2 L by nasal cannula. Lungs are clear and he speaks in full sentences. He clearly has upper respiratory symptoms with occasional cough and significant nasal congestion which is very apparent on exam. Flu is positive, we'll obtain x-ray and labs given the patient's hypoxia, we'll rule out pneumonia or other significant underlying process. The patient still does not appear to be acutely ill.  2 neg lactic acid BP has normalized O2 95%  Pt no resp symptoms tamiflu and fluticasone Pt in agreement.  Medications  oseltamivir (TAMIFLU) capsule 75 mg (not administered)  ipratropium-albuterol (DUONEB) 0.5-2.5 (3) MG/3ML nebulizer solution 3 mL (3 mLs Nebulization Given 12/24/16 0924)  predniSONE (DELTASONE) tablet 40 mg (40 mg Oral Given 12/24/16 0913)     Final Clinical Impressions(s) / ED Diagnoses   Final diagnoses:  Influenza    New Prescriptions New Prescriptions   No medications on file   I personally performed the services described in this documentation, which was scribed in my presence. The recorded information has been reviewed and is accurate.      Noemi Chapel, MD 12/24/16 1355

## 2016-12-24 NOTE — Discharge Instructions (Signed)

## 2016-12-26 ENCOUNTER — Encounter (HOSPITAL_COMMUNITY): Payer: Self-pay | Admitting: Emergency Medicine

## 2016-12-26 ENCOUNTER — Emergency Department (HOSPITAL_COMMUNITY)
Admission: EM | Admit: 2016-12-26 | Discharge: 2016-12-26 | Disposition: A | Discharge status: Home / Self Care | Payer: Medicare Other | Attending: Emergency Medicine | Admitting: Emergency Medicine

## 2016-12-26 DIAGNOSIS — Z7982 Long term (current) use of aspirin: Secondary | ICD-10-CM | POA: Insufficient documentation

## 2016-12-26 DIAGNOSIS — J449 Chronic obstructive pulmonary disease, unspecified: Secondary | ICD-10-CM | POA: Insufficient documentation

## 2016-12-26 DIAGNOSIS — I110 Hypertensive heart disease with heart failure: Secondary | ICD-10-CM | POA: Insufficient documentation

## 2016-12-26 DIAGNOSIS — R61 Generalized hyperhidrosis: Secondary | ICD-10-CM | POA: Diagnosis present

## 2016-12-26 DIAGNOSIS — I5031 Acute diastolic (congestive) heart failure: Secondary | ICD-10-CM | POA: Diagnosis not present

## 2016-12-26 DIAGNOSIS — F17210 Nicotine dependence, cigarettes, uncomplicated: Secondary | ICD-10-CM | POA: Insufficient documentation

## 2016-12-26 DIAGNOSIS — J111 Influenza due to unidentified influenza virus with other respiratory manifestations: Secondary | ICD-10-CM | POA: Diagnosis not present

## 2016-12-26 DIAGNOSIS — Z79899 Other long term (current) drug therapy: Secondary | ICD-10-CM | POA: Insufficient documentation

## 2016-12-26 NOTE — Discharge Instructions (Signed)

## 2016-12-26 NOTE — ED Notes (Signed)
EKG given to Dr. Miller.  

## 2016-12-26 NOTE — ED Triage Notes (Signed)
Pt was here Tuesday and dx with flu. Pt given flonase and tamiflu.  Pt reports sob "every once in a while." Pt wears home 02.  Pt has been having "sweats" since yesterday, without fever.  Pt alert and oriented.

## 2016-12-26 NOTE — ED Provider Notes (Signed)
[facility] DEPT Provider Note   CSN: [id number] Arrival date & time: 12/26/16  1116     History   Chief Complaint Chief Complaint  Patient presents with  . Excessive Sweating    HPI Ricardo Burch is a 56 y.o. male.  HPI  The patient is a 56 year old male, he complains today of feeling sweaty. He was diagnosed with the flu 2 days ago and since that time has felt overall better with decreased congestion, decreased cough but has had a couple episodes of sweating. This occurred at home, there were no other symptoms with it, he is not feeling sweaty at this time.  Past Medical History:  Diagnosis Date  . CHF (congestive heart failure) ([place])   . CHF (congestive heart failure) ([place])   . Complication of anesthesia    never been put to sleep  . COPD (chronic obstructive pulmonary disease) ([place]) Dx 2015  . Eczema    since childhood   . Hypertension   . Sleep apnea    CPAP    Patient Active Problem List   Diagnosis Date Noted  . Acute encephalopathy   . Cocaine abuse 07/26/2016  . Depression 07/26/2016  . OSA on CPAP 07/26/2016  . CHF (congestive heart failure) ([place]) 06/27/2016  . Alcohol abuse 06/27/2016  . Acute diastolic (congestive) heart failure 06/27/2016  . ARF (acute renal failure) ([place])   . Acute respiratory failure ([place]) 09/16/2015  . Elevated BP 08/24/2015  . COPD exacerbation ([place]) 08/24/2015  . Left elbow contusion 05/26/2015  . Healthcare maintenance 05/26/2015  . COPD (chronic obstructive pulmonary disease) ([place]) 08/23/2014  . Diastolic CHF ([place]) [id number]  . Eczema 08/23/2014  . Right knee pain 08/23/2014  . Tobacco abuse 03/27/2014    Past Surgical History:  Procedure Laterality Date  . MULTIPLE EXTRACTIONS WITH ALVEOLOPLASTY N/A 03/22/2016   Procedure: MULTIPLE EXTRACTION WITH ALVEOLOPLASTY;  Surgeon: Diona Browner, DDS;  Location: [facility];  Service: Oral Surgery;  Laterality: N/A;  . NO PAST SURGERIES         Home Medications    Prior to  Admission medications   Medication Sig Start Date End Date Taking? Authorizing Provider  ADVAIR DISKUS 250-50 MCG/DOSE AEPB Inhale 1 puff into the lungs 2 (two) times daily. 11/01/16   Historical Provider, MD  albuterol (PROVENTIL HFA;VENTOLIN HFA) 108 (90 Base) MCG/ACT inhaler Inhale 2 puffs into the lungs every 6 (six) hours as needed for wheezing or shortness of breath. 06/29/16   Robbie Lis, MD  amLODipine (NORVASC) 5 MG tablet Take 5 mg by mouth daily. 10/15/16   Historical Provider, MD  aspirin EC 81 MG tablet Take 1 tablet (81 mg total) by mouth daily. 09/20/15   Florencia Reasons, MD  budesonide-formoterol [facility]) 160-4.5 MCG/ACT inhaler Inhale 2 puffs into the lungs 2 (two) times daily. 11/25/16   Praveen Mannam, MD  DALIRESP 500 MCG TABS tablet Take 500 mcg by mouth daily. 11/01/16   Historical Provider, MD  fexofenadine (ALLEGRA) 180 MG tablet Take 180 mg by mouth daily.    Historical Provider, MD  fluticasone (FLONASE) 50 MCG/ACT nasal spray Place 2 sprays into both nostrils daily. 12/24/16   Noemi Chapel, MD  Fluticasone-Salmeterol (ADVAIR DISKUS) 250-50 MCG/DOSE AEPB Inhale 1 puff into the lungs 2 (two) times daily. 12/12/16 12/13/16  Marshell Garfinkel, MD  furosemide (LASIX) 20 MG tablet Take 1 tablet (20 mg total) by mouth 2 (two) times daily. OV needed for additional refills 07/30/16   Donita Brooks, NP  guaiFENesin (  MUCINEX) 600 MG 12 hr tablet Take 600 mg by mouth 2 (two) times daily.    Historical Provider, MD  HYDROcodone-acetaminophen (NORCO/VICODIN) 5-325 MG tablet Take 1 tablet by mouth 3 (three) times daily as needed. for pain 10/30/16   Historical Provider, MD  losartan (COZAAR) 50 MG tablet Take 50 mg by mouth daily.    Historical Provider, MD  meloxicam (MOBIC) 15 MG tablet Take 15 mg by mouth daily.    Historical Provider, MD  Misc. Devices (PULSE OXIMETER FOR FINGER) MISC 1 each by Does not apply route as needed. COPD ICD 10 J44.9 08/06/16   Boykin Nearing, MD  oseltamivir (TAMIFLU)  75 MG capsule Take 1 capsule (75 mg total) by mouth every 12 (twelve) hours. 12/24/16   Noemi Chapel, MD  OXYGEN Inhale 2 L into the lungs continuous.    Historical Provider, MD  potassium chloride (K-DUR) 10 MEQ tablet Take 1 tablet (10 mEq total) by mouth 2 (two) times daily. 07/30/16   Donita Brooks, NP  tiotropium (SPIRIVA) 18 MCG inhalation capsule Place 1 capsule (18 mcg total) into inhaler and inhale daily. 05/10/16   Josalyn Funches, MD  Tiotropium Bromide Monohydrate (SPIRIVA RESPIMAT) 1.25 MCG/ACT AERS Inhale 2 puffs into the lungs daily. 12/12/16 12/13/16  Praveen Mannam, MD  tiZANidine (ZANAFLEX) 4 MG tablet Take 8 mg by mouth every 8 (eight) hours.    Historical Provider, MD    Family History Family History  Problem Relation Age of Onset  . Cerebral aneurysm Father   . Cancer Neg Hx   . Diabetes Neg Hx   . Heart disease Neg Hx     Social History Social History  Substance Use Topics  . Smoking status: Current Every Day Smoker    Packs/day: 0.25    Years: 30.00    Types: Cigarettes  . Smokeless tobacco: Never Used     Comment: Smokes 1-2 cigarettes a day  . Alcohol use 0.6 oz/week    1 Cans of beer per week     Comment: occassionally     Allergies   Apresoline [hydralazine]   Review of Systems Review of Systems  All other systems reviewed and are negative.    Physical Exam Updated Vital Signs BP 123/85 (BP Location: Left Arm)   Pulse 85   Temp 98.4 F (36.9 C) (Oral)   Resp 18   Ht 5\' 7"  (1.702 m)   Wt 226 lb (102.5 kg)   SpO2 93%   BMI 35.40 kg/m   Physical Exam  Constitutional: He appears well-developed and well-nourished. No distress.  HENT:  Head: Normocephalic and atraumatic.  Mouth/Throat: Oropharynx is clear and moist. No oropharyngeal exudate.  Eyes: Conjunctivae and EOM are normal. Pupils are equal, round, and reactive to light. Right eye exhibits no discharge. Left eye exhibits no discharge. No scleral icterus.  Neck: Normal range of motion.  Neck supple. No JVD present. No thyromegaly present.  Cardiovascular: Normal rate, regular rhythm, normal heart sounds and intact distal pulses.  Exam reveals no gallop and no friction rub.   No murmur heard. Pulmonary/Chest: Effort normal and breath sounds normal. No respiratory distress. He has no wheezes. He has no rales.  Abdominal: Soft. Bowel sounds are normal. He exhibits no distension and no mass. There is no tenderness.  Musculoskeletal: Normal range of motion. He exhibits no edema or tenderness.  Lymphadenopathy:    He has no cervical adenopathy.  Neurological: He is alert. Coordination normal.  Skin: Skin is warm and  dry. No rash noted. No erythema.  Psychiatric: He has a normal mood and affect. His behavior is normal.  Nursing note and vitals reviewed.    ED Treatments / Results  Labs (all labs ordered are listed, but only abnormal results are displayed) Labs Reviewed - No data to display  EKG  EKG Interpretation  Date/Time:  Thursday December 26 2016 11:20:59 EST Ventricular Rate:  91 PR Interval:  206 QRS Duration: 162 QT Interval:  402 QTC Calculation: 494 R Axis:   108 Text Interpretation:  Normal sinus rhythm Biatrial enlargement Right bundle branch block Abnormal ECG unchanged ECG Confirmed by Nawal Burling  MD, Druanne Bosques ([id number]) on 12/26/2016 11:28:56 AM       Radiology No results found.  Procedures Procedures (including critical care time)  Medications Ordered in ED Medications - No data to display   Initial Impression / Assessment and Plan / ED Course  I have reviewed the triage vital signs and the nursing notes.  Pertinent labs & imaging results that were available during my care of the patient were reviewed by me and considered in my medical decision making (see chart for details).     Well-appearing, no abnormal lung sounds, no nasal congestion, no distress, oxygen of 96% on room air. Patient feels very good, no indication for further testing at this  time.  Final Clinical Impressions(s) / ED Diagnoses   Final diagnoses:  Influenza    New Prescriptions New Prescriptions   No medications on file     Noemi Chapel, MD 12/26/16 1435

## 2016-12-30 LAB — CULTURE, BLOOD (ROUTINE X 2)
CULTURE: NO GROWTH
Culture: NO GROWTH

## 2017-01-02 ENCOUNTER — Encounter: Payer: Self-pay | Admitting: Family Medicine

## 2017-01-02 ENCOUNTER — Ambulatory Visit: Payer: Medicare Other | Attending: Family Medicine | Admitting: Family Medicine

## 2017-01-02 VITALS — BP 130/86 | HR 91 | Temp 98.6°F | Ht 67.0 in | Wt 224.6 lb

## 2017-01-02 DIAGNOSIS — Z0001 Encounter for general adult medical examination with abnormal findings: Secondary | ICD-10-CM | POA: Diagnosis not present

## 2017-01-02 DIAGNOSIS — Z720 Tobacco use: Secondary | ICD-10-CM

## 2017-01-02 DIAGNOSIS — J438 Other emphysema: Secondary | ICD-10-CM | POA: Diagnosis not present

## 2017-01-02 DIAGNOSIS — G8929 Other chronic pain: Secondary | ICD-10-CM | POA: Insufficient documentation

## 2017-01-02 DIAGNOSIS — R0602 Shortness of breath: Secondary | ICD-10-CM | POA: Diagnosis not present

## 2017-01-02 DIAGNOSIS — Z7982 Long term (current) use of aspirin: Secondary | ICD-10-CM | POA: Diagnosis not present

## 2017-01-02 DIAGNOSIS — M25562 Pain in left knee: Secondary | ICD-10-CM

## 2017-01-02 DIAGNOSIS — Z79899 Other long term (current) drug therapy: Secondary | ICD-10-CM | POA: Diagnosis not present

## 2017-01-02 DIAGNOSIS — J449 Chronic obstructive pulmonary disease, unspecified: Secondary | ICD-10-CM | POA: Diagnosis not present

## 2017-01-02 DIAGNOSIS — R05 Cough: Secondary | ICD-10-CM | POA: Diagnosis not present

## 2017-01-02 DIAGNOSIS — F17210 Nicotine dependence, cigarettes, uncomplicated: Secondary | ICD-10-CM | POA: Insufficient documentation

## 2017-01-02 DIAGNOSIS — M545 Low back pain: Secondary | ICD-10-CM | POA: Insufficient documentation

## 2017-01-02 DIAGNOSIS — Z0000 Encounter for general adult medical examination without abnormal findings: Secondary | ICD-10-CM

## 2017-01-02 DIAGNOSIS — I5032 Chronic diastolic (congestive) heart failure: Secondary | ICD-10-CM | POA: Insufficient documentation

## 2017-01-02 MED ORDER — NICOTINE 7 MG/24HR TD PT24: 7.0000 mg | MEDICATED_PATCH | Freq: Every day | TRANSDERMAL | 0 refills | Status: DC

## 2017-01-02 MED ORDER — GUAIFENESIN ER 600 MG PO TB12: 600.0000 mg | ORAL_TABLET | Freq: Two times a day (BID) | ORAL | 11 refills | Status: DC

## 2017-01-02 MED ORDER — ALBUTEROL SULFATE HFA 108 (90 BASE) MCG/ACT IN AERS: 2.0000 | INHALATION_SPRAY | Freq: Four times a day (QID) | RESPIRATORY_TRACT | 5 refills | Status: DC | PRN

## 2017-01-02 MED ORDER — NICOTINE 14 MG/24HR TD PT24: 14.0000 mg | MEDICATED_PATCH | Freq: Every day | TRANSDERMAL | 1 refills | Status: DC

## 2017-01-02 MED ORDER — FUROSEMIDE 40 MG PO TABS: 40.0000 mg | ORAL_TABLET | Freq: Two times a day (BID) | ORAL | 3 refills | Status: DC

## 2017-01-02 MED ORDER — ALBUTEROL SULFATE HFA 108 (90 BASE) MCG/ACT IN AERS: 2.0000 | INHALATION_SPRAY | Freq: Four times a day (QID) | RESPIRATORY_TRACT | 1 refills | Status: DC | PRN

## 2017-01-02 MED ORDER — PULSE OXIMETER FOR FINGER MISC: 1.0000 | 0 refills | Status: DC | PRN

## 2017-01-02 MED ORDER — POTASSIUM CHLORIDE ER 20 MEQ PO TBCR: 20.0000 meq | EXTENDED_RELEASE_TABLET | Freq: Two times a day (BID) | ORAL | 5 refills | Status: DC

## 2017-01-02 MED ORDER — NICOTINE 14 MG/24HR TD PT24: 14.0000 mg | MEDICATED_PATCH | Freq: Every day | TRANSDERMAL | 2 refills | Status: DC

## 2017-01-02 NOTE — Assessment & Plan Note (Signed)
Chronic COPD No active exacerbation Continue current regimen Refilled mucinex and albuterol

## 2017-01-02 NOTE — Assessment & Plan Note (Signed)
Diastolic CHF Pulmonary edema on recent CXR Ordered lasix and potassium

## 2017-01-02 NOTE — Progress Notes (Signed)
Subjective:  Patient ID: Ricardo Burch, male    DOB: Oct 04, 1961  Age: 56 y.o. MRN: [id number]  CC: COPD   HPI Ricardo Burch has severe COPD, severe OSA, diastolic CHF, current smoker he presents for COPD    1. COPD: severe COPD. He is followed by pulmonology, Dr. Vaughan Browner. He was seen in ED on 12/24/2016 and 12/26/2016 for influenza. Influenza A PCR was positive on 12/24/2016. He was treated with prednisone 40 mg and duoneb in the ED, then sent home with prescription for  with flonase and tamiflu starting on 12/24/2016.  CXR done on 12/24/2016 revealed:  IMPRESSION: Mild perihilar airspace disease, LEFT greater than RIGHT, suggests pulmonary edema.   Today, he reports congestion, cough. No chest pain. He has shortness of breath at times. He is using 2 L supplemental O2 via nasal canula. He denies fever, chills, generalized muscle aches. He continues to smoke he down to < 1/2 PPD. He denies cocaine use. He declines pneumovax.   2. Chronic pain: in low back, L knee. He has another primary doctor, Dr. Glennie Hawk in [place], [place] where he is prescribed vicodin for his chronic pain. He last received 60 pills on 12/13/2016. He reports is too far away. Request local pain management.   Social History  Substance Use Topics  . Smoking status: Light Tobacco Smoker    Packs/day: 0.25    Years: 30.00    Types: Cigarettes  . Smokeless tobacco: Never Used     Comment: Smokes 1-2 cigarettes a day  . Alcohol use 0.6 oz/week    1 Cans of beer per week     Comment: occassionally    Outpatient Medications Prior to Visit  Medication Sig Dispense Refill  . ADVAIR DISKUS 250-50 MCG/DOSE AEPB Inhale 1 puff into the lungs 2 (two) times daily.  5  . albuterol (PROVENTIL HFA;VENTOLIN HFA) 108 (90 Base) MCG/ACT inhaler Inhale 2 puffs into the lungs every 6 (six) hours as needed for wheezing or shortness of breath. 1 Inhaler 1  . amLODipine (NORVASC) 5 MG tablet Take 5 mg by mouth daily.  1  . aspirin EC 81 MG tablet  Take 1 tablet (81 mg total) by mouth daily. 30 tablet 0  . budesonide-formoterol (SYMBICORT) 160-4.5 MCG/ACT inhaler Inhale 2 puffs into the lungs 2 (two) times daily. 2 Inhaler 0  . fluticasone (FLONASE) 50 MCG/ACT nasal spray Place 2 sprays into both nostrils daily. 16 g 2  . furosemide (LASIX) 20 MG tablet Take 1 tablet (20 mg total) by mouth 2 (two) times daily. OV needed for additional refills 60 tablet 1  . losartan (COZAAR) 50 MG tablet Take 50 mg by mouth daily.    . Misc. Devices (PULSE OXIMETER FOR FINGER) MISC 1 each by Does not apply route as needed. COPD ICD 10 J44.9 1 each 0  . oseltamivir (TAMIFLU) 75 MG capsule Take 1 capsule (75 mg total) by mouth every 12 (twelve) hours. 10 capsule 0  . OXYGEN Inhale 2 L into the lungs continuous.    . potassium chloride (K-DUR) 10 MEQ tablet Take 1 tablet (10 mEq total) by mouth 2 (two) times daily. 60 tablet 11  . tiotropium (SPIRIVA) 18 MCG inhalation capsule Place 1 capsule (18 mcg total) into inhaler and inhale daily. 90 capsule 3  . DALIRESP 500 MCG TABS tablet Take 500 mcg by mouth daily.  5  . fexofenadine (ALLEGRA) 180 MG tablet Take 180 mg by mouth daily.    . Fluticasone-Salmeterol (ADVAIR DISKUS)  250-50 MCG/DOSE AEPB Inhale 1 puff into the lungs 2 (two) times daily. 60 each 0  . guaiFENesin (MUCINEX) 600 MG 12 hr tablet Take 600 mg by mouth 2 (two) times daily.    [name] HYDROcodone-acetaminophen (NORCO/VICODIN) 5-325 MG tablet Take 1 tablet by mouth 3 (three) times daily as needed. for pain  0  . meloxicam (MOBIC) 15 MG tablet Take 15 mg by mouth daily.    . Tiotropium Bromide Monohydrate (SPIRIVA RESPIMAT) 1.25 MCG/ACT AERS Inhale 2 puffs into the lungs daily. 1 Inhaler 0  . tiZANidine (ZANAFLEX) 4 MG tablet Take 8 mg by mouth every 8 (eight) hours.     No facility-administered medications prior to visit.     ROS Review of Systems  Constitutional: Negative for chills, fatigue, fever and unexpected weight change.  HENT: Positive for  congestion. Negative for sinus pain and sinus pressure.   Eyes: Negative for visual disturbance.  Respiratory: Positive for cough. Negative for shortness of breath.   Cardiovascular: Negative for chest pain, palpitations and leg swelling.  Gastrointestinal: Negative for abdominal pain, blood in stool, constipation, diarrhea, nausea and vomiting.  Endocrine: Negative for polydipsia, polyphagia and polyuria.  Musculoskeletal: Positive for arthralgias and back pain. Negative for gait problem, myalgias and neck pain.  Skin: Negative for rash.  Allergic/Immunologic: Negative for immunocompromised state.  Hematological: Negative for adenopathy. Does not bruise/bleed easily.  Psychiatric/Behavioral: Negative for dysphoric mood, sleep disturbance and suicidal ideas. The patient is not nervous/anxious.     Objective:  BP 130/86 (BP Location: Left Arm, Patient Position: Sitting, Cuff Size: [place])   Pulse 91   Temp 98.6 F (37 C) (Oral)   Ht 5\' 7"  (1.702 m)   Wt 224 lb 9.6 oz (101.9 kg)   SpO2 94%   BMI 35.18 kg/m   BP/Weight 01/02/2017 12/26/2016 [id number]  Systolic BP [id number]  Diastolic BP 86 82 70  Wt. (Lbs) 224.6 226 226  BMI 35.18 35.4 35.4   Wt Readings from Last 3 Encounters:  01/02/17 224 lb 9.6 oz (101.9 kg)  12/26/16 226 lb (102.5 kg)  12/24/16 226 lb (102.5 kg)     Physical Exam  Constitutional: He appears well-developed and well-nourished. No distress.  HENT:  Head: Normocephalic and atraumatic.  Neck: Normal range of motion. Neck supple.  Cardiovascular: Normal rate, regular rhythm, normal heart sounds and intact distal pulses.   Pulmonary/Chest: Effort normal and breath sounds normal.  Musculoskeletal: He exhibits no edema.  Neurological: He is alert.  Skin: Skin is warm and dry. No rash noted. No erythema.  Psychiatric: He has a normal mood and affect.     Assessment & Plan:  Jory was seen today for copd.  Diagnoses and all orders for this visit:  Tobacco  abuse -     Discontinue: nicotine (NICODERM CQ - DOSED IN MG/24 HOURS) 14 mg/24hr patch; Place 1 patch (14 mg total) onto the skin daily. -     Discontinue: nicotine (NICODERM CQ - DOSED IN MG/24 HR) 7 mg/24hr patch; Place 1 patch (7 mg total) onto the skin daily. -     nicotine (NICODERM CQ - DOSED IN MG/24 HOURS) 14 mg/24hr patch; Place 1 patch (14 mg total) onto the skin daily. For 6 weeks -     nicotine (NICODERM CQ - DOSED IN MG/24 HR) 7 mg/24hr patch; Place 1 patch (7 mg total) onto the skin daily. For 3 weeks  Chronic pain of left knee -     Ambulatory referral  to Pain Clinic  Chronic bilateral low back pain without sciatica -     Ambulatory referral to Pain Clinic  Other emphysema (HCC) -     Discontinue: albuterol (PROVENTIL HFA;VENTOLIN HFA) 108 (90 Base) MCG/ACT inhaler; Inhale 2 puffs into the lungs every 6 (six) hours as needed for wheezing or shortness of breath. -     guaiFENesin (MUCINEX) 600 MG 12 hr tablet; Take 1 tablet (600 mg total) by mouth 2 (two) times daily. -     Misc. Devices (PULSE OXIMETER FOR FINGER) MISC; 1 each by Does not apply route as needed. COPD ICD 10 J44.9 -     albuterol (PROVENTIL HFA;VENTOLIN HFA) 108 (90 Base) MCG/ACT inhaler; Inhale 2 puffs into the lungs every 6 (six) hours as needed for wheezing or shortness of breath.  Chronic diastolic congestive heart failure (HCC) -     Discontinue: furosemide (LASIX) 40 MG tablet; Take 1 tablet (40 mg total) by mouth 2 (two) times daily. OV needed for additional refills -     potassium chloride 20 MEQ TBCR; Take 20 mEq by mouth 2 (two) times daily. -     furosemide (LASIX) 40 MG tablet; Take 1 tablet (40 mg total) by mouth 2 (two) times daily.  Health care maintenance -     Ambulatory referral to Gastroenterology   There are no diagnoses linked to this encounter.  No orders of the defined types were placed in this encounter.   Follow-up: No Follow-up on file.   Boykin Nearing MD

## 2017-01-02 NOTE — Patient Instructions (Addendum)
Ricardo Burch was seen today for copd.  Diagnoses and all orders for this visit:  Tobacco abuse -     Discontinue: nicotine (NICODERM CQ - DOSED IN MG/24 HOURS) 14 mg/24hr patch; Place 1 patch (14 mg total) onto the skin daily. -     Discontinue: nicotine (NICODERM CQ - DOSED IN MG/24 HR) 7 mg/24hr patch; Place 1 patch (7 mg total) onto the skin daily. -     nicotine (NICODERM CQ - DOSED IN MG/24 HOURS) 14 mg/24hr patch; Place 1 patch (14 mg total) onto the skin daily. For 6 weeks -     nicotine (NICODERM CQ - DOSED IN MG/24 HR) 7 mg/24hr patch; Place 1 patch (7 mg total) onto the skin daily. For 3 weeks  Chronic pain of left knee -     Ambulatory referral to Pain Clinic  Chronic bilateral low back pain without sciatica -     Ambulatory referral to Pain Clinic  Other emphysema ([place]) -     Discontinue: albuterol (PROVENTIL HFA;VENTOLIN HFA) 108 (90 Base) MCG/ACT inhaler; Inhale 2 puffs into the lungs every 6 (six) hours as needed for wheezing or shortness of breath. -     guaiFENesin (MUCINEX) 600 MG 12 hr tablet; Take 1 tablet (600 mg total) by mouth 2 (two) times daily. -     Misc. Devices (PULSE OXIMETER FOR FINGER) MISC; 1 each by Does not apply route as needed. COPD ICD 10 J44.9 -     albuterol (PROVENTIL HFA;VENTOLIN HFA) 108 (90 Base) MCG/ACT inhaler; Inhale 2 puffs into the lungs every 6 (six) hours as needed for wheezing or shortness of breath.  Chronic diastolic congestive heart failure (HCC) -     Discontinue: furosemide (LASIX) 40 MG tablet; Take 1 tablet (40 mg total) by mouth 2 (two) times daily. OV needed for additional refills -     potassium chloride 20 MEQ TBCR; Take 20 mEq by mouth 2 (two) times daily. -     furosemide (LASIX) 40 MG tablet; Take 1 tablet (40 mg total) by mouth 2 (two) times daily.  Health care maintenance -     Ambulatory referral to Gastroenterology   Smoking cessation support: smoking cessation hotline: 1-800-QUIT-NOW.  Smoking cessation classes are  available through [facility]. Call [phone] or visit our website at [email or web address]   F/u in 6 weeks for CHF and COPD  Dr. Adrian Blackwater

## 2017-01-02 NOTE — Assessment & Plan Note (Signed)
Cessation addressed Patient desires to quit Nicotine patches ordered

## 2017-01-06 ENCOUNTER — Other Ambulatory Visit: Payer: Self-pay | Admitting: Family Medicine

## 2017-01-08 IMAGING — CR DG CHEST 1V PORT
1 series · 1 of 1 positions shown · non-contrast
Comparison: 07/13/2016 chest radiograph.

CLINICAL DATA: 54 y/o M; shortness of breath. History of COPD and
congestive heart failure.

EXAM:
PORTABLE CHEST 1 VIEW

[AP]
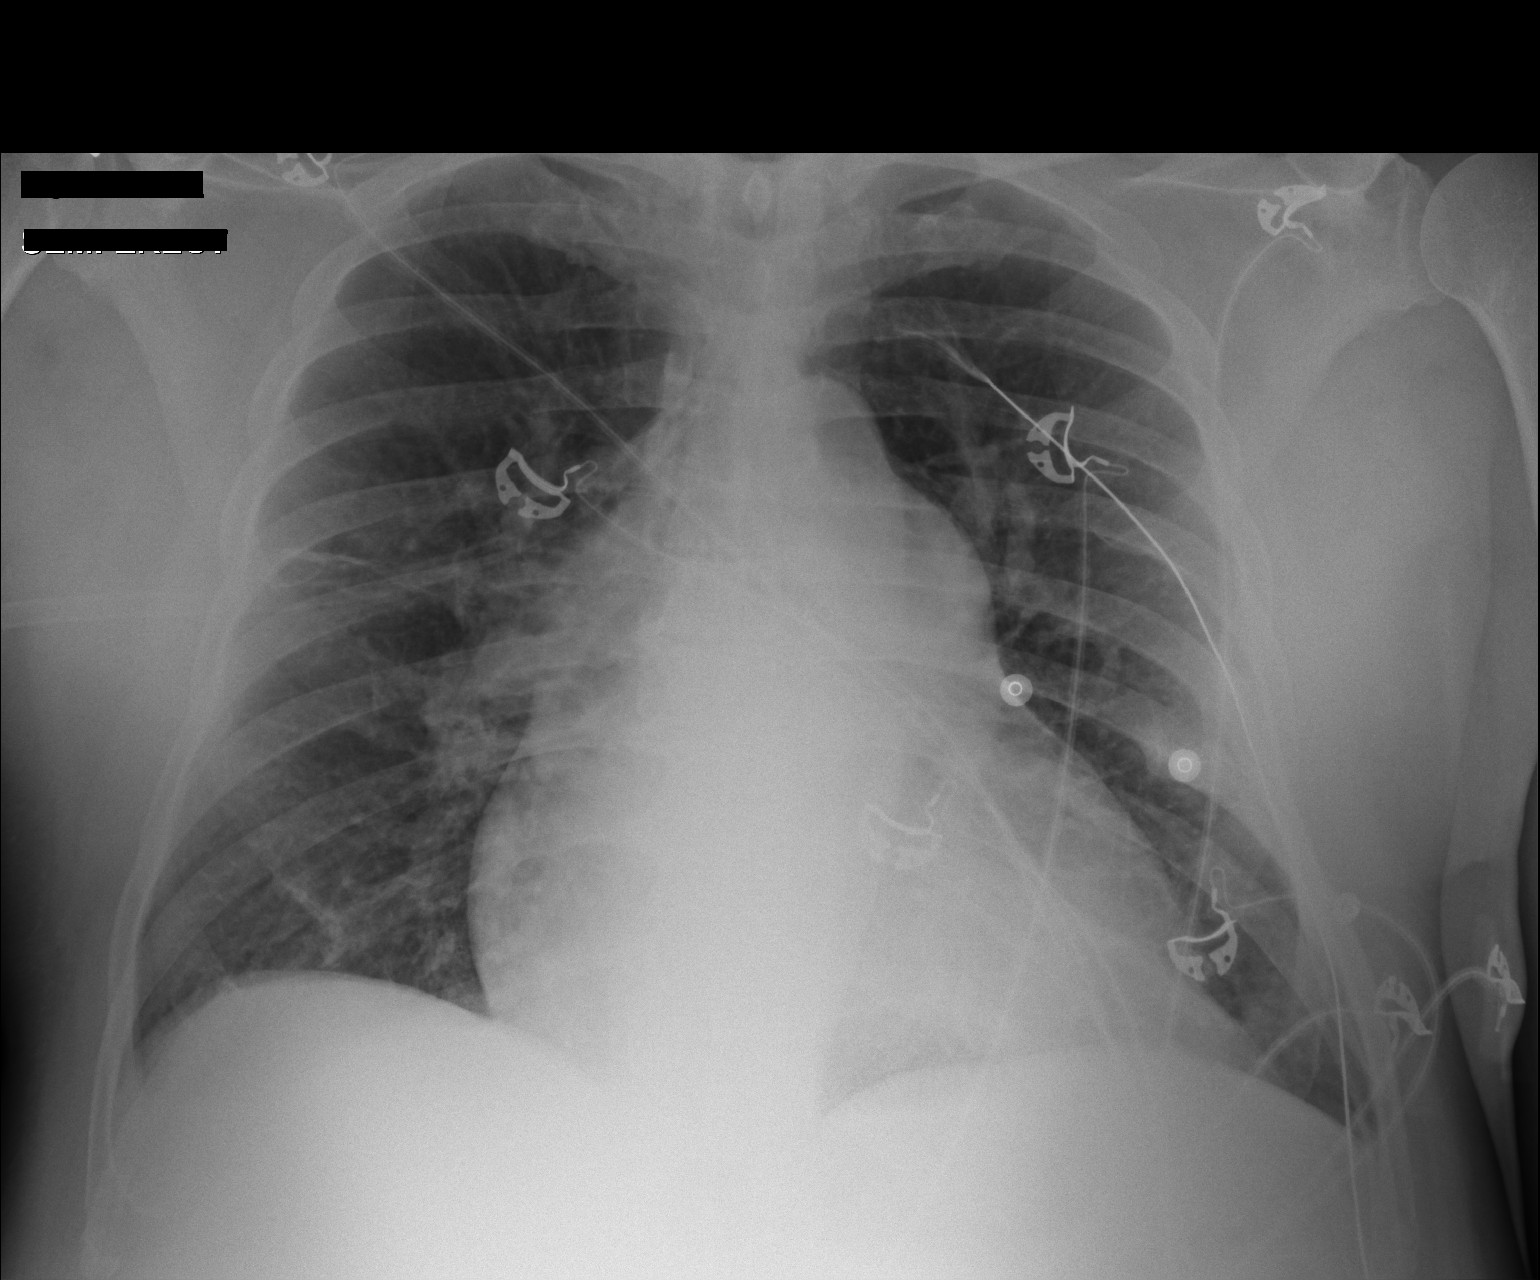

[1 of 1 positions shown; findings below may reference images not displayed]

FINDINGS: Stable cardiomegaly with enlarged central pulmonary arteries and
right ventricular configuration. Linear opacity in right mid lung
zone is unchanged and probably represent scarring. No focal
consolidation, pleural effusion, or pneumothorax is identified.
Chronic left-sided rib fractures. No acute osseous abnormality is
evident.
IMPRESSION: Stable cardiomegaly.  No active disease.

By: Awadni Ligne M.D.

## 2017-01-14 ENCOUNTER — Telehealth: Payer: Self-pay

## 2017-01-14 NOTE — Telephone Encounter (Signed)
01/14/2017  Called Mr. Notz  to schedule an appointment with Theda Sers or myself for a pharmacy-led inhaler education initiative at [facility]. He is actively trying to quit smoking, but hasn't picked up nicotine patches from the pharmacy yet. He also knows his triggers. We will discuss smoking cessation and inhaler education on Monday, 3/19 at [facility].   Belia Heman, PharmD PGY1 Pharmacy Resident [phone] (Pager)

## 2017-01-20 NOTE — Progress Notes (Signed)
Pharmacy Inhaler Education and Assessment Session  Dorsel Flinn is a 56 y.o. male who reports to the [facility] today for COPD education and medication review. The last appointment with their PCP, Dr. Adrian Blackwater, was on 01/02/2017. He is in good spirits and presents without assistance on oxygen therapy. Breckon Reeves has long-standing COPD. Today, their CAT score is 24, which represents a high impact of their COPD on their quality of life.    The patient is on the following medications for COPD: Advair Diskus 250/50 mcg - 1 puff bid Daliresp 500 mcg daily Spiriva Respimat 1.25 mcg - 2 puffs daily  Currently, the patient states that they are not taking their COPD medications as prescribed. He is taking Advair/Spiriva or Advair/Symbicort, which is a duplication in therapy sometimes. He also has not been taking Daliresp since moving. He states that they miss their medications 0 times per week, on average. Of note, the patient is smoking (5-10 cigarettes per day, down from 2 packs a day). Mr. Wain is states that he is very ready to quit, however he hasn't filled his prescription for nicotine patches and admits to pulling them off when he wants to smoke.   Assessment: Does the patient feel that his/her medications are working for him/her?  yes - states that Symbicort works better for him than Advair Diskus  Has the patient been experiencing any side effects to the medications prescribed?  no   Does the patient have an action plan for worsening symptoms?  no - provided a written one today  Does the patient have any problems obtaining medications due to transportation or finances?   yes - on a fixed income, has [place] Medicaid  Understanding of regimen: fair Understanding of indications: fair Potential of compliance: poor  Plan: Education: We discussed smoking cessation and e-cigarettes at length today. He states that he is very ready to quit smoking, however hasn't filled his  prescription for nicotine patches. He has tried Chantix in the past, but it caused dreams that were unsettling to the patient. He also admitted that he has pulled off patches in the past when he wanted to smoke. We discussed Wellbutrin, but notice that this medication has been tried in the past. I encouraged Mr. Combes to contact the [place] Quitline for support in his efforts to quit smoking.   We also discussed proper inhaler technique. Upon demonstration, Mr. Zoll inhales slowly and deeply when taking Advair Diskus. This is a DPI inhaler, and should be inhaled deeply and quickly. Mr. Seeling states that this is difficult for him to do, and that he feels his breathing is better when taking Symbicort.    Recommendations:  The following issues were identified during this visit: - Improper administration technique with Advair Diskus - Duplicate therapy with Advair and Symbicort - Non-adherence with Spiriva and Daliresp - Continued tobacco abuse  The following recommendations will be made to Dr. Adrian Blackwater: - Discontinue Advair, re-initiate Symbicort - Refill [place] - Referral to clinical pharmacist, Theda Sers, for medication management and further education, especially smoking cessation   The patient was provided with a written COPD action plan from the [facility].    Time spent with patient: 60 minutes  Belia Heman, PharmD PGY1 Pharmacy Resident [phone] (Pager) 01/20/2017 7:08 PM

## 2017-01-24 ENCOUNTER — Telehealth: Payer: Self-pay | Admitting: Family Medicine

## 2017-01-24 NOTE — Telephone Encounter (Signed)
Patient called the office to inform PCP that when he went to his pharmacy, they hadn't received any of his medications with exception of his inhaler. Please resend medication to [facility] pharmacy.  Thank you.

## 2017-01-24 NOTE — Telephone Encounter (Signed)
Pt was called and informed that pharmacy has all medications,

## 2017-02-07 ENCOUNTER — Telehealth: Payer: Self-pay | Admitting: Pulmonary Disease

## 2017-02-07 DIAGNOSIS — G4733 Obstructive sleep apnea (adult) (pediatric): Secondary | ICD-10-CM

## 2017-02-07 NOTE — Telephone Encounter (Signed)
Pt states that [facility] will not provide him with the cpap supplies we ordered at his last OV d/t documented noncompliance.  Per OV note, noncompliance is d/t mask issues, which is why patient needs a new mask as ordered. Called [facility], states that because the 12/12/16 OV note documents noncompliance and his download shows noncompliance, he will either have to "start over" with a sleep study to document a need for cpap.  PM please advise.  Thanks.

## 2017-02-07 NOTE — Telephone Encounter (Signed)
Spoke with Tammy at [facility]- verified that pt must have sleep study for insurance to cover any further supplies/equipment.  PM please advise.  Thanks.

## 2017-02-07 NOTE — Telephone Encounter (Signed)
Tammy called from [facility] - pt needs to start the process over to get cpap supplies - she can be reached at [phone] -pr

## 2017-02-10 NOTE — Telephone Encounter (Signed)
Please order split night study

## 2017-02-10 NOTE — Telephone Encounter (Signed)
Order placed. Pt aware and voiced his understanding. Nothing further needed.

## 2017-02-18 ENCOUNTER — Ambulatory Visit: Payer: Medicare Other | Attending: Pulmonary Disease | Admitting: Pulmonary Disease

## 2017-02-18 DIAGNOSIS — G4733 Obstructive sleep apnea (adult) (pediatric): Secondary | ICD-10-CM | POA: Insufficient documentation

## 2017-02-25 ENCOUNTER — Telehealth: Payer: Self-pay | Admitting: Family Medicine

## 2017-02-25 DIAGNOSIS — J438 Other emphysema: Secondary | ICD-10-CM

## 2017-02-25 MED ORDER — DALIRESP 500 MCG PO TABS: 500.0000 ug | ORAL_TABLET | Freq: Every day | ORAL | 5 refills | Status: DC

## 2017-02-25 MED ORDER — BUDESONIDE-FORMOTEROL FUMARATE 160-4.5 MCG/ACT IN AERO: 2.0000 | INHALATION_SPRAY | Freq: Two times a day (BID) | RESPIRATORY_TRACT | 5 refills | Status: DC

## 2017-02-25 NOTE — Telephone Encounter (Signed)
Called to patient Left VM Advised the following based on symptoms For increase mucus- mucinex For cough- robitusuin For cough with congestion- tylenol or robitussin D for no more than 5 days as the decongestant does raise BP  If cough is severe especialy with chest pain or shortness of breath call and come in for check to rule out COPD exacerbation.  Also regarding COPD: Take symbicort  Take spiriva Take daliresp  Do not take advair as it is a duplicate therapy to symbicort. Only take advair instead of symbicort of advair more affordable.  Requested patient call with questions

## 2017-02-25 NOTE — Telephone Encounter (Signed)
PT. Called he fill that he has a cold sx and want to know what he can do or take please follow up with PT

## 2017-02-26 ENCOUNTER — Ambulatory Visit: Payer: Medicaid Other | Admitting: Pulmonary Disease

## 2017-02-26 NOTE — Procedures (Signed)
Patient Name: Ricardo Burch, Ricardo Burch Date: 02/18/2017 Gender: Male D.O.B: June 25, 1961 Age (years): 89 Referring Provider: Marshell Garfinkel Height (inches): 33 Interpreting Physician: Kara Mead MD, ABSM Weight (lbs): 244 RPSGT: Rosebud Poles BMI: 38 MRN: [id number] Neck Size: 17.00   CLINICAL INFORMATION Sleep Study Type: Split Night CPAP  Indication for sleep study: OSA + COPD  Epworth Sleepiness Score: 4  SLEEP STUDY TECHNIQUE As per the AASM Manual for the Scoring of Sleep and Associated Events v2.3 (April 2016) with a hypopnea requiring 4% desaturations.  The channels recorded and monitored were frontal, central and occipital EEG, electrooculogram (EOG), submentalis EMG (chin), nasal and oral airflow, thoracic and abdominal wall motion, anterior tibialis EMG, snore microphone, electrocardiogram, and pulse oximetry. Continuous positive airway pressure (CPAP) was initiated when the patient met split night criteria and was titrated according to treat sleep-disordered breathing.  RESPIRATORY PARAMETERS Diagnostic  Total AHI (/hr): 68.7 RDI (/hr): 68.7 OA Index (/hr): 9.3 CA Index (/hr): 0.0 REM AHI (/hr): 32.9 NREM AHI (/hr): 77.6 Supine AHI (/hr): 92.0 Non-supine AHI (/hr): 22.89 Min O2 Sat (%): 67.00 Mean O2 (%): 86.50 Time below 88% (min): 74.7     Titration  Optimal Pressure (cm): 12 AHI at Optimal Pressure (/hr): 3.5 Min O2 at Optimal Pressure (%): 87 Supine % at Optimal (%): 53 Sleep % at Optimal : 37 m       SLEEP ARCHITECTURE The recording time for the entire night was 485.5 minutes.  During a baseline period of 222.9 minutes, the patient slept for 155.5 minutes in REM and nonREM, yielding a sleep efficiency of 69.8%. Sleep onset after lights out was 42.3 minutes with a REM latency of 67.0 minutes. The patient spent 5.14% of the night in stage N1 sleep, 50.80% in stage N2 sleep, 24.12% in stage N3 and 19.94% in REM.  During the titration period of 257.0 minutes, the  patient slept for 249.7 minutes in REM and nonREM, yielding a sleep efficiency of 97.2%. Sleep onset after CPAP initiation was 0.8 minutes with a REM latency of 5.0 minutes. The patient spent 1.80% of the night in stage N1 sleep, 44.74% in stage N2 sleep, 22.42% in stage N3 and 31.03% in REM.  CARDIAC DATA The 2 lead EKG demonstrated sinus rhythm. The mean heart rate was N/A beats per minute. Other EKG findings include: None.   LEG MOVEMENT DATA The total Periodic Limb Movements of Sleep (PLMS) were 0. The PLMS index was 0.00 .  IMPRESSIONS - Severe obstructive sleep apnea occurred during the diagnostic portion of the study (AHI = 68.7/hour). An optimal PAP pressure of 12 cm was selected for this patient based on the available study data. - No significant central sleep apnea occurred during the diagnostic portion of the study (CAI = 0.0/hour). 2L O2 was blended into CPAP - Severe oxygen desaturation was noted during the diagnostic portion of the study (Min O2 = 67.00%). - The patient snored with Loud snoring volume during the diagnostic portion of the study. - No cardiac abnormalities were noted during this study. - Clinically significant periodic limb movements did not occur during sleep.   DIAGNOSIS - Obstructive Sleep Apnea (327.23 [G47.33 ICD-10])   RECOMMENDATIONS - Initiate CPAP at 12cm with 2L O2 blended in & large F&P full face mask - Avoid alcohol, sedatives and other CNS depressants that may worsen sleep apnea and disrupt normal sleep architecture. - Sleep hygiene should be reviewed to assess factors that may improve sleep quality. - Weight management and regular exercise  should be initiated or continued. - Return to [facility] for re-evaluation after 4 weeks of therapy   Kara Mead MD Board Certified in [place]

## 2017-03-06 NOTE — Addendum Note (Signed)
Addended by: Maryanna Shape A on: 03/06/2017 09:41 AM   Modules accepted: Orders

## 2017-03-18 ENCOUNTER — Telehealth: Payer: Self-pay | Admitting: Pulmonary Disease

## 2017-03-18 ENCOUNTER — Telehealth: Payer: Self-pay | Admitting: Family Medicine

## 2017-03-18 NOTE — Telephone Encounter (Signed)
CPAP order did not hit our workque.  The order was placed on 5/3.  I found the order today and sent it as "high priority" to [facility].  I called the pt and explained what happened and he was understanding.  Nothing further needed.

## 2017-03-18 NOTE — Telephone Encounter (Signed)
Patient called the office asking to speak with nurse regarding his medication. Pt has questions about medication consumption. Please follow up.   Thank you.

## 2017-03-19 ENCOUNTER — Encounter: Payer: Self-pay | Admitting: Family Medicine

## 2017-03-20 ENCOUNTER — Telehealth: Payer: Self-pay | Admitting: Family Medicine

## 2017-03-20 DIAGNOSIS — Z0000 Encounter for general adult medical examination without abnormal findings: Secondary | ICD-10-CM

## 2017-03-20 NOTE — Telephone Encounter (Signed)
Called to patient Left VM with the following info as patient has designated party release on file Placed referral to GI in [place] Patient to cancel [place] appt first thing in the morning

## 2017-03-20 NOTE — Telephone Encounter (Signed)
Pt calling to request a referral to a gastro physician in [place] because he has no ride to get to his appt here at [place] and he resides in [place]. Also would like to know what he should do about his appt at [facility] tomorrow (03/21/17). Requests a call from [place]. Please f/u. Thank you

## 2017-03-26 ENCOUNTER — Other Ambulatory Visit: Payer: Self-pay | Admitting: Pharmacist

## 2017-03-26 DIAGNOSIS — J438 Other emphysema: Secondary | ICD-10-CM

## 2017-03-26 MED ORDER — ALBUTEROL SULFATE HFA 108 (90 BASE) MCG/ACT IN AERS: 2.0000 | INHALATION_SPRAY | Freq: Four times a day (QID) | RESPIRATORY_TRACT | 1 refills | Status: DC | PRN

## 2017-04-02 ENCOUNTER — Ambulatory Visit (INDEPENDENT_AMBULATORY_CARE_PROVIDER_SITE_OTHER): Payer: Medicare Other | Admitting: Pulmonary Disease

## 2017-04-02 ENCOUNTER — Encounter: Payer: Self-pay | Admitting: Pulmonary Disease

## 2017-04-02 VITALS — BP 110/70 | HR 84 | Ht 67.0 in | Wt 232.0 lb

## 2017-04-02 DIAGNOSIS — J439 Emphysema, unspecified: Secondary | ICD-10-CM

## 2017-04-02 DIAGNOSIS — G4733 Obstructive sleep apnea (adult) (pediatric): Secondary | ICD-10-CM | POA: Diagnosis not present

## 2017-04-02 NOTE — Patient Instructions (Addendum)
Continue using the incruse instead of the Spiriva. Continue the daliresp It ok to stop using the advair and symbicort for now. Call us if there is worsening of symptoms. Continue supplemental o2 Continue using the CPAP  Return in 6 months.

## 2017-04-02 NOTE — Progress Notes (Signed)
Ricardo Burch    [id number]    [birth year]  Primary Care Physician:Funches, Adriana Mccallum, MD  Referring Physician: Boykin Nearing, MD [address] [place], [place] [address]  Chief complaint:   Follow up for Severe COPD Severe OSA on autoset Active smoker  HPI: Ricardo Burch is a 56 year old active smoker, COPD. He's had multiple hospitalizations with COPD, CHF exacerbations in the past. His has become homeless in May 2017 and admits to excessive alcohol, drug use, dietary indiscretion since then. He is currently living in the basement of his friend's home.    He was hospitalized from 07/26/16-07/30/16 with acute pulmonary edema, acute exacerbation of COPD in the setting of alcohol, cocaine use, noncompliance with medications. He was intubated briefly for hypercarbia. After discharge he claims to be behaving better. No illegal drug use. He is taking his medications on a regular basis.   Interim History: He has finished the pulmonary rehab and reports improved dyspnea. He still has some cough with congestion, dyspnea on exertion which is at baseline. He has been on a rotating list of inhalers mainly due to his inability to pay co-pay. He is now off Advair/Symbicort. His Spiriva has been replaced by incruse. He is using his albuterol rescue inhaler 1-2 times a week. He has been started on CPAP after a repeat titration study and is tolerating it well.  Outpatient Encounter Prescriptions as of 04/02/2017  Medication Sig  . albuterol (PROVENTIL HFA;VENTOLIN HFA) 108 (90 Base) MCG/ACT inhaler Inhale 2 puffs into the lungs every 6 (six) hours as needed for wheezing or shortness of breath.  [name] amLODipine (NORVASC) 5 MG tablet Take 5 mg by mouth daily.  [name] aspirin EC 81 MG tablet Take 1 tablet (81 mg total) by mouth daily.  [name] DALIRESP 500 MCG TABS tablet Take 1 tablet (500 mcg total) by mouth daily.  . fluticasone (FLONASE) 50 MCG/ACT nasal spray Place 2 sprays into both nostrils daily.  .  furosemide (LASIX) 40 MG tablet Take 1 tablet (40 mg total) by mouth 2 (two) times daily.  [name] guaiFENesin (MUCINEX) 600 MG 12 hr tablet Take 1 tablet (600 mg total) by mouth 2 (two) times daily.  [name] HYDROcodone-acetaminophen (NORCO/VICODIN) 5-325 MG tablet Take 1 tablet by mouth 3 (three) times daily as needed. for pain  . losartan (COZAAR) 50 MG tablet Take 50 mg by mouth daily.  . meloxicam (MOBIC) 15 MG tablet Take 15 mg by mouth daily.  . Misc. Devices (PULSE OXIMETER FOR FINGER) MISC 1 each by Does not apply route as needed. COPD ICD 10 J44.9  . nicotine (NICODERM CQ - DOSED IN MG/24 HOURS) 14 mg/24hr patch Place 1 patch (14 mg total) onto the skin daily. For 6 weeks  . nicotine (NICODERM CQ - DOSED IN MG/24 HR) 7 mg/24hr patch Place 1 patch (7 mg total) onto the skin daily. For 3 weeks  . OXYGEN Inhale 2 L into the lungs continuous.  . potassium chloride 20 MEQ TBCR Take 20 mEq by mouth 2 (two) times daily. (Patient taking differently: Take 20 mEq by mouth daily. )  . tiZANidine (ZANAFLEX) 4 MG tablet Take 8 mg by mouth every 8 (eight) hours.  [name] umeclidinium bromide (INCRUSE ELLIPTA) 62.5 MCG/INH AEPB Inhale 1 puff into the lungs daily.  . budesonide-formoterol (SYMBICORT) 160-4.5 MCG/ACT inhaler Inhale 2 puffs into the lungs 2 (two) times daily. (Patient not taking: Reported on 04/02/2017)  . Tiotropium Bromide Monohydrate (SPIRIVA RESPIMAT) 1.25 MCG/ACT AERS  Inhale 2 puffs into the lungs daily. (Patient taking differently: Inhale 1 puff into the lungs 2 (two) times daily. )   No facility-administered encounter medications on file as of 04/02/2017.     Allergies as of 04/02/2017 - Review Complete 04/02/2017  Allergen Reaction Noted  . Apresoline [hydralazine] Other (See Comments) 07/10/2016  . Chantix [varenicline] Other (See Comments) 01/20/2017    Past Medical History:  Diagnosis Date  . CHF (congestive heart failure) ([place])   . CHF (congestive heart failure) ([place])   . Complication of  anesthesia    never been put to sleep  . COPD (chronic obstructive pulmonary disease) ([place]) Dx 2015  . Eczema    since childhood   . Hypertension   . Sleep apnea    CPAP    Past Surgical History:  Procedure Laterality Date  . MULTIPLE EXTRACTIONS WITH ALVEOLOPLASTY N/A 03/22/2016   Procedure: MULTIPLE EXTRACTION WITH ALVEOLOPLASTY;  Surgeon: Diona Browner, DDS;  Location: [facility];  Service: Oral Surgery;  Laterality: N/A;  . NO PAST SURGERIES      Family History  Problem Relation Age of Onset  . Cerebral aneurysm Father   . Cancer Neg Hx   . Diabetes Neg Hx   . Heart disease Neg Hx     Social History   Social History  . Marital status: Single    Spouse name: N/A  . Number of children: 4   . Years of education: 12   Occupational History  . Unemployed     Social History Main Topics  . Smoking status: Light Tobacco Smoker    Packs/day: 0.25    Years: 30.00    Types: Cigarettes  . Smokeless tobacco: Never Used     Comment: Smokes 1-2 cigarettes a day  . Alcohol use 0.6 oz/week    1 Cans of beer per week     Comment: occassionally  . Drug use: Yes    Types: Marijuana     Comment: used in the last week  . Sexual activity: No     Comment: pt. graduzlly cutting back using chan   Other Topics Concern  . Not on file   Social History Narrative    Lives alone.    4 daughters 52, 67 yo twins, eldest married live in [place].     Review of systems: Review of Systems  Constitutional: Negative for fever and chills.  HENT: Negative.   Eyes: Negative for blurred vision.  Respiratory: as per HPI  Cardiovascular: Negative for chest pain and palpitations.  Gastrointestinal: Negative for vomiting, diarrhea, blood per rectum. Genitourinary: Negative for dysuria, urgency, frequency and hematuria.  Musculoskeletal: Negative for myalgias, back pain and joint pain.  Skin: Negative for itching and rash.  Neurological: Negative for dizziness, tremors, focal weakness, seizures  and loss of consciousness.  Endo/Heme/Allergies: Negative for environmental allergies.  Psychiatric/Behavioral: Negative for depression, suicidal ideas and hallucinations.  All other systems reviewed and are negative.   Physical Exam: Blood pressure 110/70, pulse 84, height 5\' 7"  (1.702 m), weight 232 lb (105.2 kg), SpO2 94 %. Gen:      No acute distress HEENT:  EOMI, sclera anicteric Neck:     No masses; no thyromegaly Lungs:    Clear to auscultation bilaterally; normal respiratory effort CV:         Regular rate and rhythm; no murmurs Abd:      + bowel sounds; soft, non-tender; no palpable masses, no distension Ext:    No edema;  adequate peripheral perfusion Skin:      Warm and dry; no rash Neuro: alert and oriented x 3 Psych: normal mood and affect  Data Reviewed: CXR  (07/28/16) No acute cardiopulmonary process Images reviewed. CXR 11/04/16 Stable hyperinflation. Images reviewed.  Echo (09/18/15) The right ventricular systolic pressure was increased consistent with moderate pulmonary hypertension.moderate concentric LVH. LVEF 60-65 percent. Grade 2 diastolic dysfunction. RV cavity size severe grade dilated. RV systolic function moderately to severely reduced. PA pressure 56  Sleep study 10/22/15 Severe OSA, AHI 151. Started on an AutoSet CPAP titration study  Initiate CPAP at 12 with 2 L oxygen, fullface mask.  CPAP compliance report 06/23/16- 07/22/16 3% usage greater than 4 hours.  PFTs 01/16/16 FVC 2.43 [58%] FEV1 1.49 [45%) F/F 61 TLC 81% DLCO 56% Severe obstruction, moderate reduction in diffusion capacity.  Assessment:  #1 COPD Severe COPD. His symptoms are currently stable and he continues Daliresp, incruse. He hardly needs to use his albuterol rescue inhaler. Since he can't afford the LABA/steroid inhaler and his symptoms are stable we'll  just continue with current therapy.  Use supplemental O2 with exertion  #2 Severe OSA Started on CPAP 12 with O2  after titrations study. He is tolerating this well.   #3 Tobacco use He continues to smoke in spite of attempts to quit.   #4 Pulmonary hypertension Likely secondary to COPD and OSA. We will continue to monitor.  Plan/Recommendations:0 - Continue incruse, albuterol PRN, supplemental O2 - Continue daliresp - Continue CPAP  Marshell Garfinkel MD [facility] Pager [phone] 04/02/2017, 11:43 AM  CC: Boykin Nearing, MD

## 2017-04-14 ENCOUNTER — Other Ambulatory Visit: Payer: Self-pay | Admitting: Pharmacist

## 2017-04-14 ENCOUNTER — Telehealth: Payer: Self-pay

## 2017-04-14 MED ORDER — ALBUTEROL SULFATE HFA 108 (90 BASE) MCG/ACT IN AERS: 1.0000 | INHALATION_SPRAY | Freq: Four times a day (QID) | RESPIRATORY_TRACT | 1 refills | Status: DC | PRN

## 2017-04-14 NOTE — Telephone Encounter (Signed)
[phone] pt received letter to schedule tcs

## 2017-04-17 NOTE — Telephone Encounter (Signed)
Pt is coming in on May 29, 2017 @ 2:00. He is aware

## 2017-04-17 NOTE — Telephone Encounter (Signed)
Gastroenterology Pre-Procedure Review  Request Date: Requesting Physician: Dr.FUNCHES  PATIENT REVIEW QUESTIONS: The patient responded to the following health history questions as indicated:    FIRST TIME SCREENING TCS  1. Diabetes Melitis: NO 2. Joint replacements in the past 12 months: NO 3. Major health problems in the past 3 months: NO 4. Has an artificial valve or MVP: NO 5. Has a defibrillator: NO 6. Has been advised in past to take antibiotics in advance of a procedure like teeth cleaning: NO 7. Family history of colon cancer: NO 8. Alcohol Use: YES: LESS THEN A 6 PACK A WEEK 9. History of sleep apnea:YES: CPAP 10. History of coronary artery or other vascular stents placed within the last 12 months: NO    MEDICATIONS & ALLERGIES:    Patient reports the following regarding taking any blood thinners:   Plavix? NO Aspirin? NO Coumadin? NO Brilinta? NO Xarelto? NO Eliquis? NO Pradaxa? NO Savaysa? NO Effient? NO  Patient confirms/reports the following medications:  Current Outpatient Prescriptions  Medication Sig Dispense Refill  . albuterol (VENTOLIN HFA) 108 (90 Base) MCG/ACT inhaler Inhale 1-2 puffs into the lungs every 6 (six) hours as needed for wheezing or shortness of breath. 18 g 1  . aspirin EC 81 MG tablet Take 1 tablet (81 mg total) by mouth daily. 30 tablet 0  . budesonide-formoterol (SYMBICORT) 160-4.5 MCG/ACT inhaler Inhale 2 puffs into the lungs 2 (two) times daily. 1 Inhaler 5  . DALIRESP 500 MCG TABS tablet Take 1 tablet (500 mcg total) by mouth daily. 30 tablet 5  . fluticasone (FLONASE) 50 MCG/ACT nasal spray Place 2 sprays into both nostrils daily. 16 g 2  . furosemide (LASIX) 40 MG tablet Take 1 tablet (40 mg total) by mouth 2 (two) times daily. 60 tablet 3  . guaiFENesin (MUCINEX) 600 MG 12 hr tablet Take 1 tablet (600 mg total) by mouth 2 (two) times daily. 60 tablet 11  . OXYGEN Inhale 2 L into the lungs continuous.    . potassium chloride 20 MEQ  TBCR Take 20 mEq by mouth 2 (two) times daily. (Patient taking differently: Take 20 mEq by mouth daily. ) 60 tablet 5  . tiZANidine (ZANAFLEX) 4 MG tablet Take 8 mg by mouth every 8 (eight) hours.    [name] umeclidinium bromide (INCRUSE ELLIPTA) 62.5 MCG/INH AEPB Inhale 1 puff into the lungs daily.    [name] amLODipine (NORVASC) 5 MG tablet Take 5 mg by mouth daily.  1  . HYDROcodone-acetaminophen (NORCO/VICODIN) 5-325 MG tablet Take 1 tablet by mouth 3 (three) times daily as needed. for pain  0  . losartan (COZAAR) 50 MG tablet Take 50 mg by mouth daily.    . meloxicam (MOBIC) 15 MG tablet Take 15 mg by mouth daily.    . Misc. Devices (PULSE OXIMETER FOR FINGER) MISC 1 each by Does not apply route as needed. COPD ICD 10 J44.9 (Patient not taking: Reported on 04/17/2017) 1 each 0  . nicotine (NICODERM CQ - DOSED IN MG/24 HOURS) 14 mg/24hr patch Place 1 patch (14 mg total) onto the skin daily. For 6 weeks (Patient not taking: Reported on 04/17/2017) 21 patch 1  . nicotine (NICODERM CQ - DOSED IN MG/24 HR) 7 mg/24hr patch Place 1 patch (7 mg total) onto the skin daily. For 3 weeks (Patient not taking: Reported on 04/17/2017) 21 patch 0  . Tiotropium Bromide Monohydrate (SPIRIVA RESPIMAT) 1.25 MCG/ACT AERS Inhale 2 puffs into the lungs daily. (Patient taking differently: Inhale 1  puff into the lungs 2 (two) times daily. ) 1 Inhaler 0   No current facility-administered medications for this visit.     Patient confirms/reports the following allergies:  Allergies  Allergen Reactions  . Apresoline [Hydralazine] Other (See Comments)    Headache   . Chantix [Varenicline] Other (See Comments)    Bad, vivid dreams    No orders of the defined types were placed in this encounter.   AUTHORIZATION INFORMATION Primary Insurance:   ID #:   Group #:  Pre-Cert / Auth required:  Pre-Cert / Auth #:   Secondary Insurance: ,  ID #: ,  Group #:  Pre-Cert / Auth required: Pre-Cert / Auth #:   SCHEDULE  INFORMATION: Procedure has been scheduled as follows:  Date: , Time:  Location:   This Gastroenterology Pre-Precedure Review Form is being routed to the following provider(s):

## 2017-04-17 NOTE — Telephone Encounter (Signed)
Regular ETOH and chronic pain medication. Will need OV for possible sedation augmentation.

## 2017-04-17 NOTE — Telephone Encounter (Signed)
Referral was in work que.

## 2017-05-20 ENCOUNTER — Other Ambulatory Visit: Payer: Self-pay | Admitting: Pharmacist

## 2017-05-20 MED ORDER — FLUTICASONE PROPIONATE 50 MCG/ACT NA SUSP: 2.0000 | Freq: Every day | NASAL | 0 refills | Status: DC

## 2017-05-29 ENCOUNTER — Encounter: Payer: Self-pay | Admitting: Nurse Practitioner

## 2017-05-29 ENCOUNTER — Ambulatory Visit (INDEPENDENT_AMBULATORY_CARE_PROVIDER_SITE_OTHER): Payer: Medicare Other | Admitting: Nurse Practitioner

## 2017-05-29 ENCOUNTER — Telehealth: Payer: Self-pay

## 2017-05-29 ENCOUNTER — Other Ambulatory Visit: Payer: Self-pay

## 2017-05-29 DIAGNOSIS — Z789 Other specified health status: Secondary | ICD-10-CM

## 2017-05-29 DIAGNOSIS — Z1211 Encounter for screening for malignant neoplasm of colon: Secondary | ICD-10-CM

## 2017-05-29 MED ORDER — NA SULFATE-K SULFATE-MG SULF 17.5-3.13-1.6 GM/177ML PO SOLN: 1.0000 | ORAL | 0 refills | Status: DC

## 2017-05-29 NOTE — Patient Instructions (Signed)
1. We will schedule your procedure for you. 2. Further recommendations to be made based on the results of your procedure. 3. Return for follow-up based on recommendations made after your procedure. 4. Call us with any questions or concerns.

## 2017-05-29 NOTE — Telephone Encounter (Signed)
Tried to call pt to inform of pre-op appt 06/20/17 at 10:00am, no answer, LMOVM with appt. Letter also mailed.

## 2017-05-29 NOTE — Progress Notes (Signed)
Primary Care Physician:  Boykin Nearing, MD Primary Gastroenterologist:  Dr. Gala Romney  Chief Complaint  Patient presents with  . Colonoscopy    HPI:   Ricardo Burch is a 56 y.o. male who presents to schedule a colonoscopy. Phone triage was aborted and deferred to office visit due to chronic pain medications and regular alcohol use. The patient is a 56 year old male with no record of previous colonoscopy found in our system.  Today he states he's doing well. Denies abdominal pain, N/V, hematochezia, melena, fever, chills, unintentional weight loss, acute changes in bowel habits. Denies chest pain, dyspnea, dizziness, lightheadedness, syncope, near syncope. Denies any other upper or lower GI symptoms.  Baseline COPD, states his breathing is better than baseline.  Denies history of complications to anesthesia.  Past Medical History:  Diagnosis Date  . CHF (congestive heart failure) ([place])   . CHF (congestive heart failure) ([place])   . Complication of anesthesia    never been put to sleep  . COPD (chronic obstructive pulmonary disease) ([place]) Dx 2015  . Eczema    since childhood   . Hypertension   . Sleep apnea    CPAP    Past Surgical History:  Procedure Laterality Date  . MULTIPLE EXTRACTIONS WITH ALVEOLOPLASTY N/A 03/22/2016   Procedure: MULTIPLE EXTRACTION WITH ALVEOLOPLASTY;  Surgeon: Diona Browner, DDS;  Location: [facility];  Service: Oral Surgery;  Laterality: N/A;  . NO PAST SURGERIES      Current Outpatient Prescriptions  Medication Sig Dispense Refill  . aspirin EC 81 MG tablet Take 1 tablet (81 mg total) by mouth daily. 30 tablet 0  . DALIRESP 500 MCG TABS tablet Take 1 tablet (500 mcg total) by mouth daily. 30 tablet 5  . fluticasone (FLONASE) 50 MCG/ACT nasal spray Place 2 sprays into both nostrils daily. 16 g 0  . furosemide (LASIX) 40 MG tablet Take 1 tablet (40 mg total) by mouth 2 (two) times daily. 60 tablet 3  . guaiFENesin (MUCINEX) 600 MG 12 hr tablet Take 1  tablet (600 mg total) by mouth 2 (two) times daily. 60 tablet 11  . OXYGEN Inhale 2 L into the lungs continuous.    . potassium chloride 20 MEQ TBCR Take 20 mEq by mouth 2 (two) times daily. (Patient taking differently: Take 20 mEq by mouth daily. ) 60 tablet 5  . Tiotropium Bromide Monohydrate (SPIRIVA RESPIMAT) 1.25 MCG/ACT AERS Inhale 2 puffs into the lungs daily. (Patient taking differently: Inhale 1 puff into the lungs 2 (two) times daily. ) 1 Inhaler 0   No current facility-administered medications for this visit.     Allergies as of 05/29/2017 - Review Complete 05/29/2017  Allergen Reaction Noted  . Apresoline [hydralazine] Other (See Comments) 07/10/2016  . Chantix [varenicline] Other (See Comments) 01/20/2017    Family History  Problem Relation Age of Onset  . Cerebral aneurysm Father   . Cancer Neg Hx   . Diabetes Neg Hx   . Heart disease Neg Hx     Social History   Social History  . Marital status: Single    Spouse name: N/A  . Number of children: 4   . Years of education: 12   Occupational History  . Unemployed     Social History Main Topics  . Smoking status: Light Tobacco Smoker    Packs/day: 0.25    Years: 30.00    Types: Cigarettes  . Smokeless tobacco: Never Used     Comment: Smokes 1-2 cigarettes a  day  . Alcohol use 0.6 oz/week    1 Cans of beer per week     Comment: occassionally: weekends, 6-pack or less on a day; or half pint of liquior  . Drug use: Yes    Types: Marijuana     Comment: used in the last week  . Sexual activity: No     Comment: pt. graduzlly cutting back using chan   Other Topics Concern  . Not on file   Social History Narrative    Lives alone.    4 daughters 69, 21 yo twins, eldest married live in [place].     Review of Systems: General: Negative for anorexia, weight loss, fever, chills, fatigue, weakness. ENT: Negative for hoarseness, difficulty swallowing. CV: Negative for chest pain, angina, palpitations,  peripheral edema.  Respiratory: Negative for dyspnea at rest, cough, sputum, wheezing.  GI: See history of present illness. MS: Negative for joint pain, low back pain.  Derm: Negative for rash or itching.  Endo: Negative for unusual weight change.  Heme: Negative for bruising or bleeding. Allergy: Negative for rash or hives.    Physical Exam: BP 135/88   Pulse 82   Temp 98.9 F (37.2 C) (Oral)   Ht 5\' 7"  (1.702 m)   Wt 237 lb 6.4 oz (107.7 kg)   BMI 37.18 kg/m  General:   Obese male. Alert and oriented. Pleasant and cooperative. Well-nourished and well-developed.  Head:  Normocephalic and atraumatic. Eyes:  Without icterus, sclera clear and conjunctiva pink.  Ears:  Normal auditory acuity. Cardiovascular:  S1, S2 present without murmurs appreciated. Extremities without clubbing or edema. Respiratory:  Clear to auscultation bilaterally. No wheezes, rales, or rhonchi. No distress.  Gastrointestinal:  +BS, obese but soft, non-tender and non-distended. No HSM noted. No guarding or rebound. No masses appreciated.  Rectal:  Deferred  Musculoskalatal:  Symmetrical without gross deformities. Neurologic:  Alert and oriented x4;  grossly normal neurologically. Psych:  Alert and cooperative. Normal mood and affect. Heme/Lymph/Immune: No excessive bruising noted.    05/29/2017 2:28 PM   Disclaimer: This note was dictated with voice recognition software. Similar sounding words can inadvertently be transcribed and may not be corrected upon review.

## 2017-05-29 NOTE — Assessment & Plan Note (Signed)
The patient admits social alcohol use. Typically drinks on the weekends. He will have up to a 6 pack of beer per sitting or a half pint of liquor per sitting. He was previously on chronic pain medications but is not any longer. He does use marijuana occasionally. At this point, given his history of COPD occasionally on oxygen, although much improved currently, in addition to regular alcohol use and occasional recreational marijuana use we will plan for the procedure on propofol/MAC to promote adequate sedation. He does not have a family history of colon cancer. This is his first ever colonoscopy. Generally asymptomatic from a GI standpoint.  His medical history list competitions of anesthesia, but he denies this.  Proceed with TCS on propofol/MAC with Dr. Gala Romney in near future: the risks, benefits, and alternatives have been discussed with the patient in detail. The patient states understanding and desires to proceed.  The patient is not on any anticoagulants, anxiolytics, chronic pain medications, or antidepressants. He does admit regular social alcohol use, occasional marijuana use, and was previously on chronic pain medications. Given regular substance use and history of chronic medical problems including COPD on oxygen intermittently I will plan for the procedure on propofol/MAC for better monitoring and to promote adequate sedation.

## 2017-05-30 NOTE — Progress Notes (Signed)
cc'ed to pcp °

## 2017-06-05 ENCOUNTER — Ambulatory Visit: Payer: Medicare Other | Admitting: Pulmonary Disease

## 2017-06-05 ENCOUNTER — Other Ambulatory Visit: Payer: Self-pay | Admitting: Pharmacist

## 2017-06-05 MED ORDER — ALBUTEROL SULFATE HFA 108 (90 BASE) MCG/ACT IN AERS: 1.0000 | INHALATION_SPRAY | Freq: Four times a day (QID) | RESPIRATORY_TRACT | 1 refills | Status: DC | PRN

## 2017-06-09 ENCOUNTER — Ambulatory Visit: Payer: Medicare Other | Admitting: Adult Health

## 2017-06-16 DIAGNOSIS — G894 Chronic pain syndrome: Secondary | ICD-10-CM | POA: Diagnosis not present

## 2017-06-16 DIAGNOSIS — M79651 Pain in right thigh: Secondary | ICD-10-CM | POA: Diagnosis not present

## 2017-06-16 DIAGNOSIS — M545 Low back pain: Secondary | ICD-10-CM | POA: Diagnosis not present

## 2017-06-16 DIAGNOSIS — M79652 Pain in left thigh: Secondary | ICD-10-CM | POA: Diagnosis not present

## 2017-06-16 NOTE — Patient Instructions (Signed)
Ricardo Burch  06/16/2017     @PREFPERIOPPHARMACY @   Your procedure is scheduled on  06/25/2017   Report to [facility] at  1230  P.M.  Call this number if you have problems the morning of surgery:  [phone]   Remember:  Do not eat food or drink liquids after midnight.  Take these medicines the morning of surgery with A SIP OF WATER  Daliresp. Use your inhaler before you come.   Do not wear jewelry, make-up or nail polish.  Do not wear lotions, powders, or perfumes, or deoderant.  Do not shave 48 hours prior to surgery.  Men may shave face and neck.  Do not bring valuables to the hospital.  [facility] is not responsible for any belongings or valuables.  Contacts, dentures or bridgework may not be worn into surgery.  Leave your suitcase in the car.  After surgery it may be brought to your room.  For patients admitted to the hospital, discharge time will be determined by your treatment team.  Patients discharged the day of surgery will not be allowed to drive home.   Name and phone number of your driver:   family Special instructions:  Follow the diet and prep instructions given to you by Dr Roseanne Kaufman office.  Please read over the following fact sheets that you were given. Anesthesia Post-op Instructions and Care and Recovery After Surgery       Colonoscopy, Adult A colonoscopy is an exam to look at the entire large intestine. During the exam, a lubricated, bendable tube is inserted into the anus and then passed into the rectum, colon, and other parts of the large intestine. A colonoscopy is often done as a part of normal colorectal screening or in response to certain symptoms, such as anemia, persistent diarrhea, abdominal pain, and blood in the stool. The exam can help screen for and diagnose medical problems, including:  Tumors.  Polyps.  Inflammation.  Areas of bleeding.  Tell a health care provider about:  Any allergies you have.  All medicines  you are taking, including vitamins, herbs, eye drops, creams, and over-the-counter medicines.  Any problems you or family members have had with anesthetic medicines.  Any blood disorders you have.  Any surgeries you have had.  Any medical conditions you have.  Any problems you have had passing stool. What are the risks? Generally, this is a safe procedure. However, problems may occur, including:  Bleeding.  A tear in the intestine.  A reaction to medicines given during the exam.  Infection (rare).  What happens before the procedure? Eating and drinking restrictions Follow instructions from your health care provider about eating and drinking, which may include:  A few days before the procedure - follow a low-fiber diet. Avoid nuts, seeds, dried fruit, raw fruits, and vegetables.  1-3 days before the procedure - follow a clear liquid diet. Drink only clear liquids, such as clear broth or bouillon, black coffee or tea, clear juice, clear soft drinks or sports drinks, gelatin dessert, and popsicles. Avoid any liquids that contain red or purple dye.  On the day of the procedure - do not eat or drink anything during the 2 hours before the procedure, or within the time period that your health care provider recommends.  Bowel prep If you were prescribed an oral bowel prep to clean out your colon:  Take it as told by your health care provider. Starting the day  before your procedure, you will need to drink a large amount of medicated liquid. The liquid will cause you to have multiple loose stools until your stool is almost clear or light green.  If your skin or anus gets irritated from diarrhea, you may use these to relieve the irritation: ? Medicated wipes, such as adult wet wipes with aloe and vitamin E. ? A skin soothing-product like petroleum jelly.  If you vomit while drinking the bowel prep, take a break for up to 60 minutes and then begin the bowel prep again. If vomiting  continues and you cannot take the bowel prep without vomiting, call your health care provider.  General instructions  Ask your health care provider about changing or stopping your regular medicines. This is especially important if you are taking diabetes medicines or blood thinners.  Plan to have someone take you home from the hospital or clinic. What happens during the procedure?  An IV tube may be inserted into one of your veins.  You will be given medicine to help you relax (sedative).  To reduce your risk of infection: ? Your health care team will wash or sanitize their hands. ? Your anal area will be washed with soap.  You will be asked to lie on your side with your knees bent.  Your health care provider will lubricate a long, thin, flexible tube. The tube will have a camera and a light on the end.  The tube will be inserted into your anus.  The tube will be gently eased through your rectum and colon.  Air will be delivered into your colon to keep it open. You may feel some pressure or cramping.  The camera will be used to take images during the procedure.  A [place] tissue sample may be removed from your body to be examined under a microscope (biopsy). If any potential problems are found, the tissue will be sent to a lab for testing.  If [place] polyps are found, your health care provider may remove them and have them checked for cancer cells.  The tube that was inserted into your anus will be slowly removed. The procedure may vary among health care providers and hospitals. What happens after the procedure?  Your blood pressure, heart rate, breathing rate, and blood oxygen level will be monitored until the medicines you were given have worn off.  Do not drive for 24 hours after the exam.  You may have a [place] amount of blood in your stool.  You may pass gas and have mild abdominal cramping or bloating due to the air that was used to inflate your colon during the  exam.  It is up to you to get the results of your procedure. Ask your health care provider, or the department performing the procedure, when your results will be ready. This information is not intended to replace advice given to you by your health care provider. Make sure you discuss any questions you have with your health care provider. Document Released: 10/18/2000 Document Revised: 08/21/2016 Document Reviewed: 01/02/2016 Elsevier Interactive Patient Education  2018 [employer].  Colonoscopy, Adult, Care After This sheet gives you information about how to care for yourself after your procedure. Your health care provider may also give you more specific instructions. If you have problems or questions, contact your health care provider. What can I expect after the procedure? After the procedure, it is common to have:  A [place] amount of blood in your stool for 24 hours after  the procedure.  Some gas.  Mild abdominal cramping or bloating.  Follow these instructions at home: General instructions   For the first 24 hours after the procedure: ? Do not drive or use machinery. ? Do not sign important documents. ? Do not drink alcohol. ? Do your regular daily activities at a slower pace than normal. ? Eat soft, easy-to-digest foods. ? Rest often.  Take over-the-counter or prescription medicines only as told by your health care provider.  It is up to you to get the results of your procedure. Ask your health care provider, or the department performing the procedure, when your results will be ready. Relieving cramping and bloating  Try walking around when you have cramps or feel bloated.  Apply heat to your abdomen as told by your health care provider. Use a heat source that your health care provider recommends, such as a moist heat pack or a heating pad. ? Place a towel between your skin and the heat source. ? Leave the heat on for 20-30 minutes. ? Remove the heat if your skin turns  bright red. This is especially important if you are unable to feel pain, heat, or cold. You may have a greater risk of getting burned. Eating and drinking  Drink enough fluid to keep your urine clear or pale yellow.  Resume your normal diet as instructed by your health care provider. Avoid heavy or fried foods that are hard to digest.  Avoid drinking alcohol for as long as instructed by your health care provider. Contact a health care provider if:  You have blood in your stool 2-3 days after the procedure. Get help right away if:  You have more than a [place] spotting of blood in your stool.  You pass large blood clots in your stool.  Your abdomen is swollen.  You have nausea or vomiting.  You have a fever.  You have increasing abdominal pain that is not relieved with medicine. This information is not intended to replace advice given to you by your health care provider. Make sure you discuss any questions you have with your health care provider. Document Released: 06/04/2004 Document Revised: 07/15/2016 Document Reviewed: 01/02/2016 Elsevier Interactive Patient Education  2018 [place] Anesthesia is a term that refers to techniques, procedures, and medicines that help a person stay safe and comfortable during a medical procedure. Monitored anesthesia care, or sedation, is one type of anesthesia. Your anesthesia specialist may recommend sedation if you will be having a procedure that does not require you to be unconscious, such as:  Cataract surgery.  A dental procedure.  A biopsy.  A colonoscopy.  During the procedure, you may receive a medicine to help you relax (sedative). There are three levels of sedation:  Mild sedation. At this level, you may feel awake and relaxed. You will be able to follow directions.  Moderate sedation. At this level, you will be sleepy. You may not remember the procedure.  Deep sedation. At this level, you will be  asleep. You will not remember the procedure.  The more medicine you are given, the deeper your level of sedation will be. Depending on how you respond to the procedure, the anesthesia specialist may change your level of sedation or the type of anesthesia to fit your needs. An anesthesia specialist will monitor you closely during the procedure. Let your health care provider know about:  Any allergies you have.  All medicines you are taking, including vitamins, herbs, eye  drops, creams, and over-the-counter medicines.  Any use of steroids (by mouth or as a cream).  Any problems you or family members have had with sedatives and anesthetic medicines.  Any blood disorders you have.  Any surgeries you have had.  Any medical conditions you have, such as sleep apnea.  Whether you are pregnant or may be pregnant.  Any use of cigarettes, alcohol, or street drugs. What are the risks? Generally, this is a safe procedure. However, problems may occur, including:  Getting too much medicine (oversedation).  Nausea.  Allergic reaction to medicines.  Trouble breathing. If this happens, a breathing tube may be used to help with breathing. It will be removed when you are awake and breathing on your own.  Heart trouble.  Lung trouble.  Before the procedure Staying hydrated Follow instructions from your health care provider about hydration, which may include:  Up to 2 hours before the procedure - you may continue to drink clear liquids, such as water, clear fruit juice, black coffee, and plain tea.  Eating and drinking restrictions Follow instructions from your health care provider about eating and drinking, which may include:  8 hours before the procedure - stop eating heavy meals or foods such as meat, fried foods, or fatty foods.  6 hours before the procedure - stop eating light meals or foods, such as toast or cereal.  6 hours before the procedure - stop drinking milk or drinks that  contain milk.  2 hours before the procedure - stop drinking clear liquids.  Medicines Ask your health care provider about:  Changing or stopping your regular medicines. This is especially important if you are taking diabetes medicines or blood thinners.  Taking medicines such as aspirin and ibuprofen. These medicines can thin your blood. Do not take these medicines before your procedure if your health care provider instructs you not to.  Tests and exams  You will have a physical exam.  You may have blood tests done to show: ? How well your kidneys and liver are working. ? How well your blood can clot.  General instructions  Plan to have someone take you home from the hospital or clinic.  If you will be going home right after the procedure, plan to have someone with you for 24 hours.  What happens during the procedure?  Your blood pressure, heart rate, breathing, level of pain and overall condition will be monitored.  An IV tube will be inserted into one of your veins.  Your anesthesia specialist will give you medicines as needed to keep you comfortable during the procedure. This may mean changing the level of sedation.  The procedure will be performed. After the procedure  Your blood pressure, heart rate, breathing rate, and blood oxygen level will be monitored until the medicines you were given have worn off.  Do not drive for 24 hours if you received a sedative.  You may: ? Feel sleepy, clumsy, or nauseous. ? Feel forgetful about what happened after the procedure. ? Have a sore throat if you had a breathing tube during the procedure. ? Vomit. This information is not intended to replace advice given to you by your health care provider. Make sure you discuss any questions you have with your health care provider. Document Released: 07/17/2005 Document Revised: 03/29/2016 Document Reviewed: 02/11/2016 Elsevier Interactive Patient Education  2018 [place], Care After These instructions provide you with information about caring for yourself after your procedure. Your health care  provider may also give you more specific instructions. Your treatment has been planned according to current medical practices, but problems sometimes occur. Call your health care provider if you have any problems or questions after your procedure. What can I expect after the procedure? After your procedure, it is common to:  Feel sleepy for several hours.  Feel clumsy and have poor balance for several hours.  Feel forgetful about what happened after the procedure.  Have poor judgment for several hours.  Feel nauseous or vomit.  Have a sore throat if you had a breathing tube during the procedure.  Follow these instructions at home: For at least 24 hours after the procedure:   Do not: ? Participate in activities in which you could fall or become injured. ? Drive. ? Use heavy machinery. ? Drink alcohol. ? Take sleeping pills or medicines that cause drowsiness. ? Make important decisions or sign legal documents. ? Take care of children on your own.  Rest. Eating and drinking  Follow the diet that is recommended by your health care provider.  If you vomit, drink water, juice, or soup when you can drink without vomiting.  Make sure you have little or no nausea before eating solid foods. General instructions  Have a responsible adult stay with you until you are awake and alert.  Take over-the-counter and prescription medicines only as told by your health care provider.  If you smoke, do not smoke without supervision.  Keep all follow-up visits as told by your health care provider. This is important. Contact a health care provider if:  You keep feeling nauseous or you keep vomiting.  You feel light-headed.  You develop a rash.  You have a fever. Get help right away if:  You have trouble breathing. This information is not  intended to replace advice given to you by your health care provider. Make sure you discuss any questions you have with your health care provider. Document Released: 02/11/2016 Document Revised: 06/12/2016 Document Reviewed: 02/11/2016 Elsevier Interactive Patient Education  [employer].

## 2017-06-20 ENCOUNTER — Encounter (HOSPITAL_COMMUNITY): Payer: Self-pay

## 2017-06-20 ENCOUNTER — Encounter (HOSPITAL_COMMUNITY)
Admission: RE | Admit: 2017-06-20 | Discharge: 2017-06-20 | Disposition: A | Discharge status: Home / Self Care | Payer: Medicare Other | Source: Ambulatory Visit | Attending: Internal Medicine | Admitting: Internal Medicine

## 2017-06-20 DIAGNOSIS — Z01812 Encounter for preprocedural laboratory examination: Secondary | ICD-10-CM | POA: Diagnosis not present

## 2017-06-20 DIAGNOSIS — Z01810 Encounter for preprocedural cardiovascular examination: Secondary | ICD-10-CM | POA: Insufficient documentation

## 2017-06-20 LAB — CBC WITH DIFFERENTIAL/PLATELET
BASOS ABS: 0 10*3/uL (ref 0.0–0.1)
BASOS PCT: 0 %
Eosinophils Absolute: 0.1 10*3/uL (ref 0.0–0.7)
Eosinophils Relative: 1 %
HEMATOCRIT: 48.8 % (ref 39.0–52.0)
Hemoglobin: 16.5 g/dL (ref 13.0–17.0)
LYMPHS PCT: 28 %
Lymphs Abs: 3 10*3/uL (ref 0.7–4.0)
MCH: 32.2 pg (ref 26.0–34.0)
MCHC: 33.8 g/dL (ref 30.0–36.0)
MCV: 95.3 fL (ref 78.0–100.0)
MONO ABS: 1 10*3/uL (ref 0.1–1.0)
Monocytes Relative: 10 %
NEUTROS ABS: 6.6 10*3/uL (ref 1.7–7.7)
NEUTROS PCT: 61 %
Platelets: 230 10*3/uL (ref 150–400)
RBC: 5.12 MIL/uL (ref 4.22–5.81)
RDW: 13.7 % (ref 11.5–15.5)
WBC: 10.7 10*3/uL — AB (ref 4.0–10.5)

## 2017-06-20 LAB — SURGICAL PCR SCREEN
MRSA, PCR: NEGATIVE
Staphylococcus aureus: NEGATIVE

## 2017-06-20 LAB — BASIC METABOLIC PANEL
ANION GAP: 10 (ref 5–15)
BUN: 10 mg/dL (ref 6–20)
CALCIUM: 8.7 mg/dL — AB (ref 8.9–10.3)
CO2: 29 mmol/L (ref 22–32)
Chloride: 95 mmol/L — ABNORMAL LOW (ref 101–111)
Creatinine, Ser: 0.91 mg/dL (ref 0.61–1.24)
Glucose, Bld: 99 mg/dL (ref 65–99)
POTASSIUM: 3.5 mmol/L (ref 3.5–5.1)
Sodium: 134 mmol/L — ABNORMAL LOW (ref 135–145)

## 2017-06-20 LAB — RAPID URINE DRUG SCREEN, HOSP PERFORMED
Amphetamines: NOT DETECTED
BENZODIAZEPINES: NOT DETECTED
Barbiturates: NOT DETECTED
COCAINE: NOT DETECTED
OPIATES: NOT DETECTED
Tetrahydrocannabinol: NOT DETECTED

## 2017-06-24 ENCOUNTER — Telehealth: Payer: Self-pay

## 2017-06-24 NOTE — Telephone Encounter (Signed)
Pt called office, he doesn't have anyone to drive his car tomorrow to take him to hospital for his TCS. He called [facility] and the only way they can take him is if our office faxes a letter saying that his procedure can't be rescheduled (d/t late notice). He has someone to ride [facility] with him. If his procedure is canceled he will have to come back in for OV to reschedule. Letter faxed to [facility] that his procedure can't be rescheduled so he will be able to have transportation.

## 2017-06-25 ENCOUNTER — Ambulatory Visit (HOSPITAL_COMMUNITY): Payer: Medicare Other | Admitting: Anesthesiology

## 2017-06-25 ENCOUNTER — Encounter (HOSPITAL_COMMUNITY)
Admission: RE | Disposition: A | Discharge status: Home / Self Care | Payer: Self-pay | Source: Ambulatory Visit | Attending: Internal Medicine

## 2017-06-25 ENCOUNTER — Encounter (HOSPITAL_COMMUNITY): Payer: Self-pay

## 2017-06-25 ENCOUNTER — Ambulatory Visit (HOSPITAL_COMMUNITY)
Admission: RE | Admit: 2017-06-25 | Discharge: 2017-06-25 | Disposition: A | Discharge status: Home / Self Care | Payer: Medicare Other | Source: Ambulatory Visit | Attending: Internal Medicine | Admitting: Internal Medicine

## 2017-06-25 DIAGNOSIS — F17210 Nicotine dependence, cigarettes, uncomplicated: Secondary | ICD-10-CM | POA: Diagnosis not present

## 2017-06-25 DIAGNOSIS — I509 Heart failure, unspecified: Secondary | ICD-10-CM | POA: Diagnosis not present

## 2017-06-25 DIAGNOSIS — J449 Chronic obstructive pulmonary disease, unspecified: Secondary | ICD-10-CM | POA: Insufficient documentation

## 2017-06-25 DIAGNOSIS — Z1212 Encounter for screening for malignant neoplasm of rectum: Secondary | ICD-10-CM | POA: Diagnosis not present

## 2017-06-25 DIAGNOSIS — G4730 Sleep apnea, unspecified: Secondary | ICD-10-CM | POA: Insufficient documentation

## 2017-06-25 DIAGNOSIS — D122 Benign neoplasm of ascending colon: Secondary | ICD-10-CM | POA: Diagnosis not present

## 2017-06-25 DIAGNOSIS — I110 Hypertensive heart disease with heart failure: Secondary | ICD-10-CM | POA: Insufficient documentation

## 2017-06-25 DIAGNOSIS — K5730 Diverticulosis of large intestine without perforation or abscess without bleeding: Secondary | ICD-10-CM | POA: Diagnosis not present

## 2017-06-25 DIAGNOSIS — Z1211 Encounter for screening for malignant neoplasm of colon: Secondary | ICD-10-CM | POA: Diagnosis not present

## 2017-06-25 DIAGNOSIS — K635 Polyp of colon: Secondary | ICD-10-CM | POA: Diagnosis not present

## 2017-06-25 DIAGNOSIS — Z7982 Long term (current) use of aspirin: Secondary | ICD-10-CM | POA: Diagnosis not present

## 2017-06-25 DIAGNOSIS — Z79899 Other long term (current) drug therapy: Secondary | ICD-10-CM | POA: Diagnosis not present

## 2017-06-25 DIAGNOSIS — D124 Benign neoplasm of descending colon: Secondary | ICD-10-CM | POA: Diagnosis not present

## 2017-06-25 HISTORY — PX: POLYPECTOMY: SHX5525

## 2017-06-25 HISTORY — PX: COLONOSCOPY WITH PROPOFOL: SHX5780

## 2017-06-25 SURGERY — COLONOSCOPY WITH PROPOFOL
Anesthesia: Monitor Anesthesia Care

## 2017-06-25 MED ORDER — MIDAZOLAM HCL 5 MG/5ML IJ SOLN
INTRAMUSCULAR | Status: DC | PRN
  Administered 2017-06-25: 1 mg via INTRAVENOUS

## 2017-06-25 MED ORDER — LACTATED RINGERS IV SOLN
INTRAVENOUS | Status: DC
  Administered 2017-06-25: 15:00:00 via INTRAVENOUS

## 2017-06-25 MED ORDER — CHLORHEXIDINE GLUCONATE CLOTH 2 % EX PADS: 6.0000 | MEDICATED_PAD | Freq: Once | CUTANEOUS | Status: DC

## 2017-06-25 MED ORDER — PROPOFOL 500 MG/50ML IV EMUL
INTRAVENOUS | Status: DC | PRN
  Administered 2017-06-25: 65 ug/kg/min via INTRAVENOUS
  Administered 2017-06-25: 100 ug/kg/min via INTRAVENOUS

## 2017-06-25 MED ORDER — PROPOFOL 10 MG/ML IV BOLUS
INTRAVENOUS | Status: DC | PRN
  Administered 2017-06-25 (×4): 15 mg via INTRAVENOUS

## 2017-06-25 MED ORDER — LIDOCAINE HCL 1 % IJ SOLN
INTRAMUSCULAR | Status: DC | PRN
  Administered 2017-06-25: 50 mg via INTRADERMAL

## 2017-06-25 MED ORDER — SODIUM CHLORIDE 0.9% FLUSH
INTRAVENOUS | Status: AC
  Filled 2017-06-25: qty 10

## 2017-06-25 MED ORDER — MIDAZOLAM HCL 2 MG/2ML IJ SOLN
INTRAMUSCULAR | Status: AC
  Filled 2017-06-25: qty 2

## 2017-06-25 MED ORDER — LORAZEPAM 2 MG/ML IJ SOLN
INTRAMUSCULAR | Status: AC
  Filled 2017-06-25: qty 1

## 2017-06-25 MED ORDER — PROPOFOL 10 MG/ML IV BOLUS
INTRAVENOUS | Status: AC
  Filled 2017-06-25: qty 40

## 2017-06-25 MED ORDER — LORAZEPAM 2 MG/ML IJ SOLN
0.5000 mg | Freq: Once | INTRAMUSCULAR | Status: AC
  Administered 2017-06-25: 0.5 mg via INTRAVENOUS

## 2017-06-25 MED ORDER — LIDOCAINE HCL (PF) 1 % IJ SOLN
INTRAMUSCULAR | Status: AC
  Filled 2017-06-25: qty 5

## 2017-06-25 NOTE — Transfer of Care (Signed)
Immediate Anesthesia Transfer of Care Note  Patient: Ricardo Burch  Procedure(s) Performed: Procedure(s) with comments: COLONOSCOPY WITH PROPOFOL (N/A) - 2:00pm POLYPECTOMY - colon  Patient Location: PACU  Anesthesia Type:MAC  Level of Consciousness: awake and patient cooperative  Airway & Oxygen Therapy: Patient Spontanous Breathing and Patient connected to nasal cannula oxygen  Post-op Assessment: Report given to RN and Post -op Vital signs reviewed and stable  Post vital signs: Reviewed and stable  Last Vitals:  Vitals:   06/25/17 1445 06/25/17 1455  BP: 117/88 122/77  Pulse:    Resp: 17 16  Temp:    SpO2: 98% 98%    Last Pain:  Vitals:   06/25/17 1248  TempSrc: Oral         Complications: No apparent anesthesia complications

## 2017-06-25 NOTE — Interval H&P Note (Signed)
History and Physical Interval Note:  06/25/2017 2:36 PM  Ricardo Burch  has presented today for surgery, with the diagnosis of screening colonoscopy  The various methods of treatment have been discussed with the patient and family. After consideration of risks, benefits and other options for treatment, the patient has consented to  Procedure(s) with comments: COLONOSCOPY WITH PROPOFOL (N/A) - 2:00pm as a surgical intervention .  The patient's history has been reviewed, patient examined, no change in status, stable for surgery.  I have reviewed the patient's chart and labs.  Questions were answered to the patient's satisfaction.     Daven Montz  No change.  First ever average risk screening colonoscopy per plan.  The risks, benefits, limitations, alternatives and imponderables have been reviewed with the patient. Questions have been answered. All parties are agreeable.

## 2017-06-25 NOTE — Discharge Instructions (Addendum)
Colon Polyps Polyps are tissue growths inside the body. Polyps can grow in many places, including the large intestine (colon). A polyp may be a round bump or a mushroom-shaped growth. You could have one polyp or several. Most colon polyps are noncancerous (benign). However, some colon polyps can become cancerous over time. What are the causes? The exact cause of colon polyps is not known. What increases the risk? This condition is more likely to develop in people who:  Have a family history of colon cancer or colon polyps.  Are older than 57 or older than 45 if they are African American.  Have inflammatory bowel disease, such as ulcerative colitis or Crohn disease.  Are overweight.  Smoke cigarettes.  Do not get enough exercise.  Drink too much alcohol.  Eat a diet that is: ? High in fat and red meat. ? Low in fiber.  Had childhood cancer that was treated with abdominal radiation.  What are the signs or symptoms? Most polyps do not cause symptoms. If you have symptoms, they may include:  Blood coming from your rectum when having a bowel movement.  Blood in your stool.The stool may look dark red or black.  A change in bowel habits, such as constipation or diarrhea.  How is this diagnosed? This condition is diagnosed with a colonoscopy. This is a procedure that uses a lighted, flexible scope to look at the inside of your colon. How is this treated? Treatment for this condition involves removing any polyps that are found. Those polyps will then be tested for cancer. If cancer is found, your health care provider will talk to you about options for colon cancer treatment. Follow these instructions at home: Diet  Eat plenty of fiber, such as fruits, vegetables, and whole grains.  Eat foods that are high in calcium and vitamin D, such as milk, cheese, yogurt, eggs, liver, fish, and broccoli.  Limit foods high in fat, red meats, and processed meats, such as hot dogs, sausage,  bacon, and lunch meats.  Maintain a healthy weight, or lose weight if recommended by your health care provider. General instructions  Do not smoke cigarettes.  Do not drink alcohol excessively.  Keep all follow-up visits as told by your health care provider. This is important. This includes keeping regularly scheduled colonoscopies. Talk to your health care provider about when you need a colonoscopy.  Exercise every day or as told by your health care provider. Contact a health care provider if:  You have new or worsening bleeding during a bowel movement.  You have new or increased blood in your stool.  You have a change in bowel habits.  You unexpectedly lose weight. This information is not intended to replace advice given to you by your health care provider. Make sure you discuss any questions you have with your health care provider. Document Released: 07/17/2004 Document Revised: 03/28/2016 Document Reviewed: 09/11/2015 Elsevier Interactive Patient Education  [employer]. Diverticulosis Diverticulosis is a condition that develops when Hemminger pouches (diverticula) form in the wall of the large intestine (colon). The colon is where water is absorbed and stool is formed. The pouches form when the inside layer of the colon pushes through weak spots in the outer layers of the colon. You may have a few pouches or many of them. What are the causes? The cause of this condition is not known. What increases the risk? The following factors may make you more likely to develop this condition:  Being older than age  49. Your risk for this condition increases with age. Diverticulosis is rare among people younger than age 73. By age 76, many people have it.  Eating a low-fiber diet.  Having frequent constipation.  Being overweight.  Not getting enough exercise.  Smoking.  Taking over-the-counter pain medicines, like aspirin and ibuprofen.  Having a family history of  diverticulosis.  What are the signs or symptoms? In most people, there are no symptoms of this condition. If you do have symptoms, they may include:  Bloating.  Cramps in the abdomen.  Constipation or diarrhea.  Pain in the lower left side of the abdomen.  How is this diagnosed? This condition is most often diagnosed during an exam for other colon problems. Because diverticulosis usually has no symptoms, it often cannot be diagnosed independently. This condition may be diagnosed by:  Using a flexible scope to examine the colon (colonoscopy).  Taking an X-ray of the colon after dye has been put into the colon (barium enema).  Doing a CT scan.  How is this treated? You may not need treatment for this condition if you have never developed an infection related to diverticulosis. If you have had an infection before, treatment may include:  Eating a high-fiber diet. This may include eating more fruits, vegetables, and grains.  Taking a fiber supplement.  Taking a live bacteria supplement (probiotic).  Taking medicine to relax your colon.  Taking antibiotic medicines.  Follow these instructions at home:  Drink 6-8 glasses of water or more each day to prevent constipation.  Try not to strain when you have a bowel movement.  If you have had an infection before: ? Eat more fiber as directed by your health care provider or your diet and nutrition specialist (dietitian). ? Take a fiber supplement or probiotic, if your health care provider approves.  Take over-the-counter and prescription medicines only as told by your health care provider.  If you were prescribed an antibiotic, take it as told by your health care provider. Do not stop taking the antibiotic even if you start to feel better.  Keep all follow-up visits as told by your health care provider. This is important. Contact a health care provider if:  You have pain in your abdomen.  You have bloating.  You have  cramps.  You have not had a bowel movement in 3 days. Get help right away if:  Your pain gets worse.  Your bloating becomes very bad.  You have a fever or chills, and your symptoms suddenly get worse.  You vomit.  You have bowel movements that are bloody or black.  You have bleeding from your rectum. Summary  Diverticulosis is a condition that develops when Pizzimenti pouches (diverticula) form in the wall of the large intestine (colon).  You may have a few pouches or many of them.  This condition is most often diagnosed during an exam for other colon problems.  If you have had an infection related to diverticulosis, treatment may include increasing the fiber in your diet, taking supplements, or taking medicines. This information is not intended to replace advice given to you by your health care provider. Make sure you discuss any questions you have with your health care provider. Document Released: 07/18/2004 Document Revised: 09/09/2016 Document Reviewed: 09/09/2016 Elsevier Interactive Patient Education  2017 [place].  Colonoscopy Discharge Instructions  Read the instructions outlined below and refer to this sheet in the next few weeks. These discharge instructions provide you with general information on caring  for yourself after you leave the hospital. Your doctor may also give you specific instructions. While your treatment has been planned according to the most current medical practices available, unavoidable complications occasionally occur. If you have any problems or questions after discharge, call Dr. Gala Romney at [phone]. ACTIVITY  You may resume your regular activity, but move at a slower pace for the next 24 hours.   Take frequent rest periods for the next 24 hours.   Walking will help get rid of the air and reduce the bloated feeling in your belly (abdomen).   No driving for 24 hours (because of the medicine (anesthesia) used during the test).    Do not sign any  important legal documents or operate any machinery for 24 hours (because of the anesthesia used during the test).  NUTRITION  Drink plenty of fluids.   You may resume your normal diet as instructed by your doctor.   Begin with a light meal and progress to your normal diet. Heavy or fried foods are harder to digest and may make you feel sick to your stomach (nauseated).   Avoid alcoholic beverages for 24 hours or as instructed.  MEDICATIONS  You may resume your normal medications unless your doctor tells you otherwise.  WHAT YOU CAN EXPECT TODAY  Some feelings of bloating in the abdomen.   Passage of more gas than usual.   Spotting of blood in your stool or on the toilet paper.  IF YOU HAD POLYPS REMOVED DURING THE COLONOSCOPY:  No aspirin products for 7 days or as instructed.   No alcohol for 7 days or as instructed.   Eat a soft diet for the next 24 hours.  FINDING OUT THE RESULTS OF YOUR TEST Not all test results are available during your visit. If your test results are not back during the visit, make an appointment with your caregiver to find out the results. Do not assume everything is normal if you have not heard from your caregiver or the medical facility. It is important for you to follow up on all of your test results.  SEEK IMMEDIATE MEDICAL ATTENTION IF:  You have more than a spotting of blood in your stool.   Your belly is swollen (abdominal distention).   You are nauseated or vomiting.   You have a temperature over 101.   You have abdominal pain or discomfort that is severe or gets worse throughout the day.    Colon diverticulosis and polyp information  Further recommendations to follow pending review of pathology report

## 2017-06-25 NOTE — Anesthesia Postprocedure Evaluation (Signed)
Anesthesia Post Note  Patient: Ricardo Burch  Procedure(s) Performed: Procedure(s) (LRB): COLONOSCOPY WITH PROPOFOL (N/A) POLYPECTOMY  Patient location during evaluation: Short Stay Anesthesia Type: MAC Level of consciousness: awake and alert Pain management: satisfactory to patient Vital Signs Assessment: post-procedure vital signs reviewed and stable Respiratory status: spontaneous breathing Cardiovascular status: stable Postop Assessment: no signs of nausea or vomiting Anesthetic complications: no     Last Vitals:  Vitals:   06/25/17 1545 06/25/17 1551  BP: 102/76   Pulse: 82 92  Resp: 20 16  Temp:  36.7 C  SpO2: 92% 92%    Last Pain:  Vitals:   06/25/17 1551  TempSrc: Oral                 Tekeyah Santiago

## 2017-06-25 NOTE — Anesthesia Procedure Notes (Signed)
Procedure Name: MAC Date/Time: 06/25/2017 2:56 PM Performed by: Vista Deck Pre-anesthesia Checklist: Patient identified, Emergency Drugs available, Suction available, Timeout performed and Patient being monitored Patient Re-evaluated:Patient Re-evaluated prior to induction Oxygen Delivery Method: Non-rebreather mask

## 2017-06-25 NOTE — H&P (View-Only) (Signed)
Primary Care Physician:  Boykin Nearing, MD Primary Gastroenterologist:  Dr. Gala Romney  Chief Complaint  Patient presents with  . Colonoscopy    HPI:   Ricardo Burch is a 56 y.o. male who presents to schedule a colonoscopy. Phone triage was aborted and deferred to office visit due to chronic pain medications and regular alcohol use. The patient is a 56 year old male with no record of previous colonoscopy found in our system.  Today he states he's doing well. Denies abdominal pain, N/V, hematochezia, melena, fever, chills, unintentional weight loss, acute changes in bowel habits. Denies chest pain, dyspnea, dizziness, lightheadedness, syncope, near syncope. Denies any other upper or lower GI symptoms.  Baseline COPD, states his breathing is better than baseline.  Denies history of complications to anesthesia.  Past Medical History:  Diagnosis Date  . CHF (congestive heart failure) ([place])   . CHF (congestive heart failure) ([place])   . Complication of anesthesia    never been put to sleep  . COPD (chronic obstructive pulmonary disease) ([place]) Dx 2015  . Eczema    since childhood   . Hypertension   . Sleep apnea    CPAP    Past Surgical History:  Procedure Laterality Date  . MULTIPLE EXTRACTIONS WITH ALVEOLOPLASTY N/A 03/22/2016   Procedure: MULTIPLE EXTRACTION WITH ALVEOLOPLASTY;  Surgeon: Diona Browner, DDS;  Location: [facility];  Service: Oral Surgery;  Laterality: N/A;  . NO PAST SURGERIES      Current Outpatient Prescriptions  Medication Sig Dispense Refill  . aspirin EC 81 MG tablet Take 1 tablet (81 mg total) by mouth daily. 30 tablet 0  . DALIRESP 500 MCG TABS tablet Take 1 tablet (500 mcg total) by mouth daily. 30 tablet 5  . fluticasone (FLONASE) 50 MCG/ACT nasal spray Place 2 sprays into both nostrils daily. 16 g 0  . furosemide (LASIX) 40 MG tablet Take 1 tablet (40 mg total) by mouth 2 (two) times daily. 60 tablet 3  . guaiFENesin (MUCINEX) 600 MG 12 hr tablet Take 1  tablet (600 mg total) by mouth 2 (two) times daily. 60 tablet 11  . OXYGEN Inhale 2 L into the lungs continuous.    . potassium chloride 20 MEQ TBCR Take 20 mEq by mouth 2 (two) times daily. (Patient taking differently: Take 20 mEq by mouth daily. ) 60 tablet 5  . Tiotropium Bromide Monohydrate (SPIRIVA RESPIMAT) 1.25 MCG/ACT AERS Inhale 2 puffs into the lungs daily. (Patient taking differently: Inhale 1 puff into the lungs 2 (two) times daily. ) 1 Inhaler 0   No current facility-administered medications for this visit.     Allergies as of 05/29/2017 - Review Complete 05/29/2017  Allergen Reaction Noted  . Apresoline [hydralazine] Other (See Comments) 07/10/2016  . Chantix [varenicline] Other (See Comments) 01/20/2017    Family History  Problem Relation Age of Onset  . Cerebral aneurysm Father   . Cancer Neg Hx   . Diabetes Neg Hx   . Heart disease Neg Hx     Social History   Social History  . Marital status: Single    Spouse name: N/A  . Number of children: 4   . Years of education: 12   Occupational History  . Unemployed     Social History Main Topics  . Smoking status: Light Tobacco Smoker    Packs/day: 0.25    Years: 30.00    Types: Cigarettes  . Smokeless tobacco: Never Used     Comment: Smokes 1-2 cigarettes a  day  . Alcohol use 0.6 oz/week    1 Cans of beer per week     Comment: occassionally: weekends, 6-pack or less on a day; or half pint of liquior  . Drug use: Yes    Types: Marijuana     Comment: used in the last week  . Sexual activity: No     Comment: pt. graduzlly cutting back using chan   Other Topics Concern  . Not on file   Social History Narrative    Lives alone.    4 daughters 73, 77 yo twins, eldest married live in [place].     Review of Systems: General: Negative for anorexia, weight loss, fever, chills, fatigue, weakness. ENT: Negative for hoarseness, difficulty swallowing. CV: Negative for chest pain, angina, palpitations,  peripheral edema.  Respiratory: Negative for dyspnea at rest, cough, sputum, wheezing.  GI: See history of present illness. MS: Negative for joint pain, low back pain.  Derm: Negative for rash or itching.  Endo: Negative for unusual weight change.  Heme: Negative for bruising or bleeding. Allergy: Negative for rash or hives.    Physical Exam: BP 135/88   Pulse 82   Temp 98.9 F (37.2 C) (Oral)   Ht 5\' 7"  (1.702 m)   Wt 237 lb 6.4 oz (107.7 kg)   BMI 37.18 kg/m  General:   Obese male. Alert and oriented. Pleasant and cooperative. Well-nourished and well-developed.  Head:  Normocephalic and atraumatic. Eyes:  Without icterus, sclera clear and conjunctiva pink.  Ears:  Normal auditory acuity. Cardiovascular:  S1, S2 present without murmurs appreciated. Extremities without clubbing or edema. Respiratory:  Clear to auscultation bilaterally. No wheezes, rales, or rhonchi. No distress.  Gastrointestinal:  +BS, obese but soft, non-tender and non-distended. No HSM noted. No guarding or rebound. No masses appreciated.  Rectal:  Deferred  Musculoskalatal:  Symmetrical without gross deformities. Neurologic:  Alert and oriented x4;  grossly normal neurologically. Psych:  Alert and cooperative. Normal mood and affect. Heme/Lymph/Immune: No excessive bruising noted.    05/29/2017 2:28 PM   Disclaimer: This note was dictated with voice recognition software. Similar sounding words can inadvertently be transcribed and may not be corrected upon review.

## 2017-06-25 NOTE — Op Note (Signed)
[facility] Patient Name: Ricardo Burch Procedure Date: 06/25/2017 2:29 PM MRN: [id number] Date of Birth: 1961/09/14 Attending MD: Norvel Richards , MD CSN: [id number] Age: 56 Admit Type: Outpatient Procedure:                Colonoscopy Indications:              Screening for colorectal malignant neoplasm Providers:                Norvel Richards, MD, Otis Peak B. Sharon Seller, RN,                            Randa Spike, Technician Referring MD:             Boykin Nearing MD Medicines:                Propofol per Anesthesia Complications:            No immediate complications. Estimated Blood Loss:     Estimated blood loss was minimal. Procedure:                Pre-Anesthesia Assessment:                           - Prior to the procedure, a History and Physical                            was performed, and patient medications and                            allergies were reviewed. The patient's tolerance of                            previous anesthesia was also reviewed. The risks                            and benefits of the procedure and the sedation                            options and risks were discussed with the patient.                            All questions were answered, and informed consent                            was obtained. ASA Grade Assessment: II - A patient                            with mild systemic disease. After reviewing the                            risks and benefits, the patient was deemed in                            satisfactory condition to undergo the procedure.  After obtaining informed consent, the colonoscope                            was passed under direct vision. Throughout the                            procedure, the patient's blood pressure, pulse, and                            oxygen saturations were monitored continuously. The                            EC-[id number] ([id number]) scope was introduced  through                            the and advanced to the the cecum, identified by                            appendiceal orifice and ileocecal valve. The                            colonoscopy was performed without difficulty. The                            patient tolerated the procedure well. The quality                            of the bowel preparation was adequate. The                            ileocecal valve, appendiceal orifice, and rectum                            were photographed. The entire colon was well                            visualized. The ileocecal valve, appendiceal                            orifice, and rectum were photographed. The entire                            colon was well visualized. The quality of the bowel                            preparation was adequate. Scope In: 3:07:32 PM Scope Out: 3:23:16 PM Scope Withdrawal Time: 0 hours 7 minutes 36 seconds  Total Procedure Duration: 0 hours 15 minutes 44 seconds  Findings:      The perianal and digital rectal examinations were normal.      Scattered Shimamoto and large-mouthed diverticula were found in the sigmoid       colon and descending colon.      Two sessile polyps were found in the descending colon and ascending       colon.  The polyps were 4 to 6 mm in size. These polyps were removed with       a cold snare. Resection and retrieval were complete. Estimated blood       loss was minimal.      The entire examined colon appeared normal on direct and retroflexion       views. Impression:               - Diverticulosis in the sigmoid colon and in the                            descending colon.                           - Two 4 to 6 mm polyps in the descending colon and                            in the ascending colon, removed with a cold snare.                            Resected and retrieved.                           - The entire examined colon is normal on direct and                             retroflexion views. Moderate Sedation:      Moderate (conscious) sedation was personally administered by an       anesthesia professional. The following parameters were monitored: oxygen       saturation, heart rate, blood pressure, respiratory rate, EKG, adequacy       of pulmonary ventilation, and response to care. Total physician       intraservice time was 24 minutes. Recommendation:           - Patient has a contact number available for                            emergencies. The signs and symptoms of potential                            delayed complications were discussed with the                            patient. Return to normal activities tomorrow.                            Written discharge instructions were provided to the                            patient.                           - Resume previous diet.                           - Patient has a contact number available for  emergencies. The signs and symptoms of potential                            delayed complications were discussed with the                            patient. Return to normal activities tomorrow.                            Written discharge instructions were provided to the                            patient.                           - Resume previous diet.                           - Continue present medications.                           - Await pathology results.                           - Repeat colonoscopy date to be determined after                            pending pathology results are reviewed for                            surveillance based on pathology results.                           - Return to GI office (date not yet determined). Procedure Code(s):        --- Professional ---                           [phone], Colonoscopy, flexible; with removal of                            tumor(s), polyp(s), or other lesion(s) by snare                             technique Diagnosis Code(s):        --- Professional ---                           Z12.11, Encounter for screening for malignant                            neoplasm of colon                           D12.4, Benign neoplasm of descending colon                           D12.2, Benign  neoplasm of ascending colon                           K57.30, Diverticulosis of large intestine without                            perforation or abscess without bleeding CPT copyright 2016 [employer]. All rights reserved. The codes documented in this report are preliminary and upon coder review may  be revised to meet current compliance requirements. Cristopher Estimable. Jewels Langone, MD Norvel Richards, MD 06/25/2017 3:31:26 PM This report has been signed electronically. Number of Addenda: 0

## 2017-06-25 NOTE — Anesthesia Preprocedure Evaluation (Signed)
Anesthesia Evaluation  Patient identified by MRN, date of birth, ID band Patient awake  General Assessment Comment:Chronic pain, use of drugs  Airway Mallampati: I  TM Distance: >3 FB Neck ROM: Full    Dental  (+) Poor Dentition, Chipped, Missing   Pulmonary sleep apnea and Oxygen sleep apnea , COPD,  COPD inhaler, Current Smoker,    Pulmonary exam normal breath sounds clear to auscultation       Cardiovascular hypertension, +CHF  Normal cardiovascular exam Rhythm:Regular Rate:Normal  - Pulmonary arteries: Systolic pressure was moderately to severely   increased. PA peak pressure: 62 mm Hg (S).- Left ventricle: The cavity size was normal. Systolic function was   normal.   The estimated ejection fraction was in the range of 55%   to 60%. Wall motion was normal; there were no regional wall   motion abnormalities. Doppler parameters are consistent with   abnormal left ventricular relaxation (grade 1 diastolic   Neuro/Psych Anxiety Depression Drugs of Abuse         Component                Value               Date/Time                 LABOPIA                  NONE DETECTED       06/20/2017 1042           COCAINSCRNUR             NONE DETECTED       06/20/2017 1042           LABBENZ                  NONE DETECTED       06/20/2017 1042           AMPHETMU                 NONE DETECTED       06/20/2017 1042           THCU                     NONE DETECTED       06/20/2017 1042           LABBARB                  NONE DETECTED       06/20/2017 1042        Positive cocaine 26 July 2016   GI/Hepatic   Endo/Other    Renal/GU      Musculoskeletal   Abdominal (+) + obese,   Peds  Hematology   Anesthesia Other Findings + Cocaine Use, hx marijuana Drugs of Abuse    Reproductive/Obstetrics                             Anesthesia Physical Anesthesia Plan  ASA: IV  Anesthesia Plan: MAC   Post-op  Pain Management:    Induction: Intravenous  PONV Risk Score and Plan:   Airway Management Planned: Mask and Natural Airway  Additional Equipment:   Intra-op Plan:   Post-operative Plan:   Informed Consent: I have reviewed the patients History and Physical, chart, labs and discussed the procedure including the risks, benefits and alternatives for the proposed  anesthesia with the patient or authorized representative who has indicated his/her understanding and acceptance.     Plan Discussed with: CRNA  Anesthesia Plan Comments:         Anesthesia Quick Evaluation

## 2017-06-26 ENCOUNTER — Other Ambulatory Visit: Payer: Self-pay | Admitting: Pharmacist

## 2017-06-26 ENCOUNTER — Telehealth: Payer: Self-pay | Admitting: Internal Medicine

## 2017-06-26 MED ORDER — FLUTICASONE PROPIONATE 50 MCG/ACT NA SUSP: 2.0000 | Freq: Every day | NASAL | 0 refills | Status: DC

## 2017-06-26 NOTE — Telephone Encounter (Signed)
Work note at the [employer]. Pt is aware.

## 2017-06-26 NOTE — Telephone Encounter (Signed)
Dr.Rourk, is this ok?

## 2017-06-26 NOTE — Telephone Encounter (Signed)
Pt had colonoscopy yesterday, He is asking for a work note to stay out today and return tomorrow. Please advise and call him at [phone]

## 2017-06-26 NOTE — Telephone Encounter (Signed)
Yes

## 2017-06-29 ENCOUNTER — Encounter: Payer: Self-pay | Admitting: Internal Medicine

## 2017-06-30 ENCOUNTER — Ambulatory Visit: Payer: Medicare Other | Admitting: Pulmonary Disease

## 2017-06-30 DIAGNOSIS — M25561 Pain in right knee: Secondary | ICD-10-CM | POA: Diagnosis not present

## 2017-06-30 DIAGNOSIS — M25562 Pain in left knee: Secondary | ICD-10-CM | POA: Diagnosis not present

## 2017-06-30 DIAGNOSIS — M545 Low back pain: Secondary | ICD-10-CM | POA: Diagnosis not present

## 2017-06-30 DIAGNOSIS — G894 Chronic pain syndrome: Secondary | ICD-10-CM | POA: Diagnosis not present

## 2017-07-01 ENCOUNTER — Encounter (HOSPITAL_COMMUNITY): Payer: Self-pay | Admitting: Internal Medicine

## 2017-07-01 ENCOUNTER — Other Ambulatory Visit: Payer: Self-pay | Admitting: Pharmacist

## 2017-07-01 DIAGNOSIS — I5032 Chronic diastolic (congestive) heart failure: Secondary | ICD-10-CM

## 2017-07-01 MED ORDER — FUROSEMIDE 40 MG PO TABS: 40.0000 mg | ORAL_TABLET | Freq: Two times a day (BID) | ORAL | 0 refills | Status: DC

## 2017-07-03 ENCOUNTER — Ambulatory Visit (INDEPENDENT_AMBULATORY_CARE_PROVIDER_SITE_OTHER): Payer: Medicare Other | Admitting: Acute Care

## 2017-07-03 ENCOUNTER — Encounter: Payer: Self-pay | Admitting: Acute Care

## 2017-07-03 VITALS — BP 122/82 | HR 85 | Ht 67.0 in | Wt 235.0 lb

## 2017-07-03 DIAGNOSIS — Z9989 Dependence on other enabling machines and devices: Secondary | ICD-10-CM

## 2017-07-03 DIAGNOSIS — Z720 Tobacco use: Secondary | ICD-10-CM

## 2017-07-03 DIAGNOSIS — J438 Other emphysema: Secondary | ICD-10-CM | POA: Diagnosis not present

## 2017-07-03 DIAGNOSIS — G4733 Obstructive sleep apnea (adult) (pediatric): Secondary | ICD-10-CM

## 2017-07-03 MED ORDER — ROFLUMILAST 500 MCG PO TABS: 500.0000 ug | ORAL_TABLET | Freq: Every day | ORAL | 2 refills | Status: DC

## 2017-07-03 MED ORDER — UMECLIDINIUM BROMIDE 62.5 MCG/INH IN AEPB: 1.0000 | INHALATION_SPRAY | Freq: Every day | RESPIRATORY_TRACT | Status: DC

## 2017-07-03 NOTE — Assessment & Plan Note (Signed)
Stable interval Plan We will renew prescriptions for you Incruse and Daliresp Continue taking them as you have been doing. Continue using Mucinex 1200 mg once daily. Take with a full glass of water. Place order for smaller oxygen tank. Remember oxygen saturation goals are 88-92%

## 2017-07-03 NOTE — Assessment & Plan Note (Signed)
Noncompliance with use per download Patient states is because he is concerned about using dirty equipment Plan Continue on CPAP at bedtime. You appear to be benefiting from the treatment We will place an order for new CPAP supplies. Please wear every night. You need to increase your compliance and device use. Goal is to wear for at least 6 hours each night for maximal clinical benefit. Continue to work on weight loss, as the link between excess weight  and sleep apnea is well established.  Do not drive if sleepy. Remember to clean mask, tubing, filter, and reservoir once weekly with soapy water.  Follow up with 3 months with NP or Dr. Vaughan Browner or before as needed.  Please contact office for sooner follow up if symptoms do not improve or worsen or seek emergency care

## 2017-07-03 NOTE — Assessment & Plan Note (Signed)
Continued tobacco abuse Reviewed health risks of continued tobacco abuse Plan Quit smoking please. This is the single most powerful action you can take to decrease your risk of lung cancer, pulmonary disease,stroke,and  heart disease. Do not smoke while wearing oxygen. Do not expose open flame while around oxygen tank.

## 2017-07-03 NOTE — Progress Notes (Signed)
History of Present Illness Ricardo Burch is a 56 y.o. male current every day smoker with severe OSA and COPD. He is followed by Dr. Elsworth Soho.  Ricardo Burch is a 56 year old active smoker, with COPD. He's had multiple hospitalizations with COPD, CHF exacerbations in the past. His has become homeless in May 2017 and admits to excessive alcohol, drug use, dietary indiscretion since then. He is currently living in the basement of his friend's home, and working on quitting smoking.    07/03/2017  6 month follow up for COPD and OSA. Pt. Presents today for follow up. He states he  is doing well. He states he is compliant with his Daliresp and his Incruse.No flares of his COPD. He continues to have some dyspnea with exertion, especially going up stairs. He suspects this might have something to do with overall deconditioning. He states he has not been using his CPAP with 2 L nasal cannula blended into circuit every night. He states this is because he needs new equipment, and is afraid the supplies he has now are not clean. He is concerned about getting an infection using supplies that are old. Saturations are 93% on RA. He states he wears oxygen with exertion at 2 L [place]. He is asking about a [employer] portable tank. He denies fever, chest pain, orthopnea, or hemoptysis. He denies any leg or calf pain.   Test Results: Down Load 06/01/2017 through 06/30/2017 Air sense  10 AutoSet Set pressure 12 cm H2O Usage days: 11/30 or 37% > than 4 hours 7 days or 23%  < In 4 hours 4 days with 13% Average usage 4 hours 12 minutes AHI 4.8  Sleep study April 2018 Severe obstructive sleep apnea occurred during the diagnostic portion of the study (AHI = 68.7/hour).  Severe oxygen desaturation was noted during the diagnostic portion of the study (Min O2 = 67.00%).  CBC Latest Ref Rng & Units 06/20/2017 12/24/2016 11/04/2016  WBC 4.0 - 10.5 K/uL 10.7(H) 6.3 11.8(H)  Hemoglobin 13.0 - 17.0 g/dL 16.5 15.9 15.6  Hematocrit 39.0 - 52.0  % 48.8 46.6 46.4  Platelets 150 - 400 K/uL 230 144(L) 269    BMP Latest Ref Rng & Units 06/20/2017 12/24/2016 11/04/2016  Glucose 65 - 99 mg/dL 99 92 105(H)  BUN 6 - 20 mg/dL 10 7 10   Creatinine 0.61 - 1.24 mg/dL 0.91 0.84 0.92  Sodium 135 - 145 mmol/L 134(L) 132(L) 136  Potassium 3.5 - 5.1 mmol/L 3.5 4.0 3.1(L)  Chloride 101 - 111 mmol/L 95(L) 93(L) 92(L)  CO2 22 - 32 mmol/L 29 32 34(H)  Calcium 8.9 - 10.3 mg/dL 8.7(L) 8.4(L) 8.7(L)    BNP    Component Value Date/Time   BNP 20.0 12/24/2016 1045    ProBNP    Component Value Date/Time   PROBNP 4,582.0 (H) 03/28/2014 0311    PFT    Component Value Date/Time   FEV1PRE 1.49 01/16/2016 1259   FEV1POST 1.65 01/16/2016 1259   FVCPRE 2.43 01/16/2016 1259   FVCPOST 2.72 01/16/2016 1259   TLC 5.67 01/16/2016 1259   DLCOUNC 18.27 01/16/2016 1259   PREFEV1FVCRT 61 01/16/2016 1259   PSTFEV1FVCRT 61 01/16/2016 1259    No results found.   Past medical hx Past Medical History:  Diagnosis Date  . Arthritis   . CHF (congestive heart failure) ([place])   . CHF (congestive heart failure) ([place])   . COPD (chronic obstructive pulmonary disease) ([place]) Dx 2015  . Eczema    since childhood   .  Hypertension   . Sleep apnea    CPAP     Social History  Substance Use Topics  . Smoking status: Light Tobacco Smoker    Packs/day: 0.25    Years: 30.00    Types: Cigarettes  . Smokeless tobacco: Never Used     Comment: Smokes 1-2 cigarettes a day  . Alcohol use 0.6 oz/week    1 Cans of beer per week     Comment: occassionally: weekends, 6-pack or less on a day; or half pint of liquior    [name] reports that he has been smoking Cigarettes.  He has a 7.50 pack-year smoking history. He has never used smokeless tobacco. He reports that he drinks about 0.6 oz of alcohol per week . He reports that he uses drugs, including Marijuana and Cocaine.  Tobacco Cessation: Patient is a current every day smoker I have spent 4-5 minutes counseling  patient on smoking cessation this visit. I have reviewed the risks of wearing oxygen, or being around an oxygen tank while smoking or exposing oxygen to an open flame I have reviewed the risks of flash burns patients have sustained while smoking while on oxygen.   Past surgical hx, Family hx, Social hx all reviewed.  Current Outpatient Prescriptions on File Prior to Visit  Medication Sig  . albuterol (VENTOLIN HFA) 108 (90 Base) MCG/ACT inhaler Inhale 1-2 puffs into the lungs every 6 (six) hours as needed for wheezing or shortness of breath. (Patient taking differently: Inhale 2 puffs into the lungs every 6 (six) hours as needed for wheezing or shortness of breath. )  . aspirin EC 81 MG tablet Take 1 tablet (81 mg total) by mouth daily.  [name] DALIRESP 500 MCG TABS tablet Take 1 tablet (500 mcg total) by mouth daily.  . fluticasone (FLONASE) 50 MCG/ACT nasal spray Place 2 sprays into both nostrils daily.  . furosemide (LASIX) 40 MG tablet Take 1 tablet (40 mg total) by mouth 2 (two) times daily.  . Guaifenesin (MUCINEX MAXIMUM STRENGTH) 1200 MG TB12 Take 1,200 mg by mouth daily.  . OXYGEN Inhale 2 L into the lungs continuous.  . potassium chloride 20 MEQ TBCR Take 20 mEq by mouth 2 (two) times daily.   No current facility-administered medications on file prior to visit.      Allergies  Allergen Reactions  . Apresoline [Hydralazine] Other (See Comments)    Headache   . Chantix [Varenicline] Other (See Comments)    Bad, vivid dreams    Review Of Systems:  Constitutional:   No  weight loss, night sweats,  Fevers, chills, fatigue, or  lassitude.  HEENT:   No headaches,  Difficulty swallowing,  Tooth/dental problems, or  Sore throat,                No sneezing, itching, ear ache, nasal congestion, post nasal drip,   CV:  No chest pain,  Orthopnea, PND, swelling in lower extremities, anasarca, dizziness, palpitations, syncope.   GI  No heartburn, indigestion, abdominal pain, nausea,  vomiting, diarrhea, change in bowel habits, loss of appetite, bloody stools.   Resp:+ shortness of breath with exertion not  at rest.  No excess mucus, no productive cough,  No non-productive cough,  No coughing up of blood.  No change in color of mucus.  No wheezing.  No chest wall deformity  Skin: no rash or lesions.  GU: no dysuria, change in color of urine, no urgency or frequency.  No flank pain, no hematuria  MS:  No joint pain or swelling.  No decreased range of motion.  No back pain.  Psych:  No change in mood or affect. No depression or anxiety.  No memory loss.   Vital Signs BP 122/82 (BP Location: Left Arm, Cuff Size: Normal)   Pulse 85   Ht 5\' 7"  (1.702 m)   Wt 235 lb (106.6 kg)   SpO2 93%   BMI 36.81 kg/m    Physical Exam:  General- No distress,  A&Ox3, pleasant ENT: No sinus tenderness, TM clear, pale nasal mucosa, no oral exudate,no post nasal drip, no LAN Cardiac: S1, S2, regular rate and rhythm, no murmur Chest: No wheeze/ rales/ dullness; no accessory muscle use, no nasal flaring, no sternal retractions Abd.: Soft Non-tender, obese Ext: No clubbing cyanosis, edema Neuro:  normal strength Skin: No rashes, warm and dry Psych: normal mood and behavior   Assessment/Plan  COPD (chronic obstructive pulmonary disease) (HCC) Stable interval Plan We will renew prescriptions for you Incruse and Daliresp Continue taking them as you have been doing. Continue using Mucinex 1200 mg once daily. Take with a full glass of water. Place order for smaller oxygen tank. Remember oxygen saturation goals are 88-92%   Tobacco abuse Continued tobacco abuse Reviewed health risks of continued tobacco abuse Plan Quit smoking please. This is the single most powerful action you can take to decrease your risk of lung cancer, pulmonary disease,stroke,and  heart disease. Do not smoke while wearing oxygen. Do not expose open flame while around oxygen tank.   OSA on  CPAP Noncompliance with use per download Patient states is because he is concerned about using dirty equipment Plan Continue on CPAP at bedtime. You appear to be benefiting from the treatment We will place an order for new CPAP supplies. Please wear every night. You need to increase your compliance and device use. Goal is to wear for at least 6 hours each night for maximal clinical benefit. Continue to work on weight loss, as the link between excess weight  and sleep apnea is well established.  Do not drive if sleepy. Remember to clean mask, tubing, filter, and reservoir once weekly with soapy water.  Follow up with 3 months with NP or Dr. Vaughan Browner or before as needed.  Please contact office for sooner follow up if symptoms do not improve or worsen or seek emergency care        Magdalen Spatz, NP 07/03/2017  3:37 PM

## 2017-07-03 NOTE — Patient Instructions (Addendum)
It is nice to meet you today. We will renew prescriptions for you Incruse and Daliresp Continue taking them as you have been doing. Continue using Mucinex 1200 mg once daily. Take with a full glass of water. Place order for smaller oxygen tank. Remember oxygen saturation goals are 88-92%   Quit smoking please. This is the single most powerful action you can take to decrease your risk of lung cancer, pulmonary disease,stroke,and  heart disease. Do not smoke while wearing oxygen. Do not expose open flame while around oxygen tank.   Continue on CPAP at bedtime. You appear to be benefiting from the treatment We will place an order for new CPAP supplies. Please wear every night. You need to increase your compliance and device use. Goal is to wear for at least 6 hours each night for maximal clinical benefit. Continue to work on weight loss, as the link between excess weight  and sleep apnea is well established.  Do not drive if sleepy. Remember to clean mask, tubing, filter, and reservoir once weekly with soapy water.  Follow up with 3 months with NP or Dr. Vaughan Browner or before as needed.  Please contact office for sooner follow up if symptoms do not improve or worsen or seek emergency care

## 2017-07-28 ENCOUNTER — Other Ambulatory Visit: Payer: Self-pay | Admitting: Internal Medicine

## 2017-07-28 DIAGNOSIS — I5032 Chronic diastolic (congestive) heart failure: Secondary | ICD-10-CM

## 2017-07-31 ENCOUNTER — Emergency Department (HOSPITAL_COMMUNITY)
Admission: EM | Admit: 2017-07-31 | Discharge: 2017-07-31 | Disposition: A | Discharge status: Home / Self Care | Payer: Medicare Other | Attending: Emergency Medicine | Admitting: Emergency Medicine

## 2017-07-31 ENCOUNTER — Encounter (HOSPITAL_COMMUNITY): Payer: Self-pay | Admitting: *Deleted

## 2017-07-31 DIAGNOSIS — L539 Erythematous condition, unspecified: Secondary | ICD-10-CM | POA: Insufficient documentation

## 2017-07-31 DIAGNOSIS — I110 Hypertensive heart disease with heart failure: Secondary | ICD-10-CM | POA: Insufficient documentation

## 2017-07-31 DIAGNOSIS — S80861A Insect bite (nonvenomous), right lower leg, initial encounter: Secondary | ICD-10-CM | POA: Diagnosis not present

## 2017-07-31 DIAGNOSIS — S30861A Insect bite (nonvenomous) of abdominal wall, initial encounter: Secondary | ICD-10-CM | POA: Diagnosis not present

## 2017-07-31 DIAGNOSIS — W57XXXA Bitten or stung by nonvenomous insect and other nonvenomous arthropods, initial encounter: Secondary | ICD-10-CM | POA: Insufficient documentation

## 2017-07-31 DIAGNOSIS — J449 Chronic obstructive pulmonary disease, unspecified: Secondary | ICD-10-CM | POA: Insufficient documentation

## 2017-07-31 DIAGNOSIS — F17210 Nicotine dependence, cigarettes, uncomplicated: Secondary | ICD-10-CM | POA: Insufficient documentation

## 2017-07-31 DIAGNOSIS — Z7982 Long term (current) use of aspirin: Secondary | ICD-10-CM | POA: Diagnosis not present

## 2017-07-31 DIAGNOSIS — S80862A Insect bite (nonvenomous), left lower leg, initial encounter: Secondary | ICD-10-CM | POA: Diagnosis not present

## 2017-07-31 DIAGNOSIS — Z79899 Other long term (current) drug therapy: Secondary | ICD-10-CM | POA: Insufficient documentation

## 2017-07-31 DIAGNOSIS — I5030 Unspecified diastolic (congestive) heart failure: Secondary | ICD-10-CM | POA: Insufficient documentation

## 2017-07-31 DIAGNOSIS — R21 Rash and other nonspecific skin eruption: Secondary | ICD-10-CM | POA: Insufficient documentation

## 2017-07-31 MED ORDER — DIPHENHYDRAMINE HCL 25 MG PO CAPS
25.0000 mg | ORAL_CAPSULE | Freq: Once | ORAL | Status: AC
  Administered 2017-07-31: 25 mg via ORAL
  Filled 2017-07-31: qty 1

## 2017-07-31 MED ORDER — PREDNISONE 20 MG PO TABS
40.0000 mg | ORAL_TABLET | Freq: Once | ORAL | Status: AC
  Administered 2017-07-31: 40 mg via ORAL
  Filled 2017-07-31: qty 2

## 2017-07-31 MED ORDER — PREDNISONE 10 MG PO TABS: ORAL_TABLET | ORAL | 0 refills | Status: DC

## 2017-07-31 MED ORDER — HYDROXYZINE HCL 25 MG PO TABS: 25.0000 mg | ORAL_TABLET | Freq: Four times a day (QID) | ORAL | 0 refills | Status: DC

## 2017-07-31 NOTE — Discharge Instructions (Signed)
Start the prednisone prescription tomorrow. Follow-up with your primary doctor or return here if needed.

## 2017-07-31 NOTE — ED Triage Notes (Signed)
Insect bites to lower abdomen and legs,c/o itching

## 2017-08-02 NOTE — ED Provider Notes (Signed)
[facility] DEPT Provider Note   CSN: [id number] Arrival date & time: 07/31/17  1227     History   Chief Complaint Chief Complaint  Patient presents with  . Insect Bite    HPI Ricardo Burch is a 56 y.o. male.  HPI   Ricardo Burch is a 56 y.o. male who presents to the Emergency Department complaining of multiple insect bites to his abdomen and both lower legs. Symptoms began several days ago. He states that he is staying at an apartment employees there are bedbugs there. He complains of itching to the affected areas. He is tried multiple over-the-counter creams without relief. He denies pain, swelling, body aches, fever or chills. Nothing makes his symptoms better or worse.   Past Medical History:  Diagnosis Date  . Arthritis   . CHF (congestive heart failure) ([place])   . CHF (congestive heart failure) ([place])   . COPD (chronic obstructive pulmonary disease) ([place]) Dx 2015  . Eczema    since childhood   . Hypertension   . Sleep apnea    CPAP    Patient Active Problem List   Diagnosis Date Noted  . Social alcohol use 05/29/2017  . Acute encephalopathy   . Cocaine abuse 07/26/2016  . Depression 07/26/2016  . OSA on CPAP 07/26/2016  . CHF (congestive heart failure) ([place]) 06/27/2016  . Alcohol abuse 06/27/2016  . Acute diastolic (congestive) heart failure ([place]) 06/27/2016  . Elevated BP 08/24/2015  . Left elbow contusion 05/26/2015  . Healthcare maintenance 05/26/2015  . COPD (chronic obstructive pulmonary disease) ([place]) 08/23/2014  . Diastolic CHF ([place]) 18/84/1660  . Eczema 08/23/2014  . Right knee pain 08/23/2014  . Tobacco abuse 03/27/2014    Past Surgical History:  Procedure Laterality Date  . COLONOSCOPY WITH PROPOFOL N/A 06/25/2017   Procedure: COLONOSCOPY WITH PROPOFOL;  Surgeon: Daneil Dolin, MD;  [facility];  Service: Endoscopy;  Laterality: N/A;  2:00pm  . MULTIPLE EXTRACTIONS WITH ALVEOLOPLASTY N/A 03/22/2016   Procedure: MULTIPLE EXTRACTION  WITH ALVEOLOPLASTY;  Surgeon: Diona Browner, DDS;  Location: [facility];  Service: Oral Surgery;  Laterality: N/A;  . NO PAST SURGERIES    . POLYPECTOMY  06/25/2017   Procedure: POLYPECTOMY;  Surgeon: Daneil Dolin, MD;  Location: [facility];  Service: Endoscopy;;  colon       Home Medications    Prior to Admission medications   Medication Sig Start Date End Date Taking? Authorizing Provider  albuterol (VENTOLIN HFA) 108 (90 Base) MCG/ACT inhaler Inhale 1-2 puffs into the lungs every 6 (six) hours as needed for wheezing or shortness of breath. Patient taking differently: Inhale 2 puffs into the lungs every 6 (six) hours as needed for wheezing or shortness of breath.  06/05/17   Boykin Nearing, MD  aspirin EC 81 MG tablet Take 1 tablet (81 mg total) by mouth daily. 09/20/15   Florencia Reasons, MD  DALIRESP 500 MCG TABS tablet Take 1 tablet (500 mcg total) by mouth daily. 02/25/17   Funches, Adriana Mccallum, MD  fluticasone (FLONASE) 50 MCG/ACT nasal spray Place 2 sprays into both nostrils daily. 06/26/17   Tresa Garter, MD  furosemide (LASIX) 40 MG tablet Take 1 tablet (40 mg total) by mouth 2 (two) times daily. 07/01/17   Tresa Garter, MD  Guaifenesin (MUCINEX MAXIMUM STRENGTH) 1200 MG TB12 Take 1,200 mg by mouth daily.    [provider]  HYDROcodone-acetaminophen (NORCO/VICODIN) 5-325 MG tablet Take 1 tablet by mouth every 6 (six) hours  as needed for moderate pain.    [provider]  hydrOXYzine (ATARAX/VISTARIL) 25 MG tablet Take 1 tablet (25 mg total) by mouth every 6 (six) hours. As needed for itching 07/31/17   Aryella Besecker, PA-C  OXYGEN Inhale 2 L into the lungs continuous.    [provider]  potassium chloride 20 MEQ TBCR Take 20 mEq by mouth 2 (two) times daily. 01/02/17   Funches, Adriana Mccallum, MD  predniSONE (DELTASONE) 10 MG tablet Take 6 tablets day one, 5 tablets day two, 4 tablets day three, 3 tablets day four, 2 tablets day five, then 1 tablet day six  07/31/17   Kyri Dai, PA-C  roflumilast (DALIRESP) 500 MCG TABS tablet Take 1 tablet (500 mcg total) by mouth daily. 07/03/17   Magdalen Spatz, NP    Family History Family History  Problem Relation Age of Onset  . Cerebral aneurysm Father   . Cancer Neg Hx   . Diabetes Neg Hx   . Heart disease Neg Hx     Social History Social History  Substance Use Topics  . Smoking status: Light Tobacco Smoker    Packs/day: 0.25    Years: 30.00    Types: Cigarettes  . Smokeless tobacco: Never Used     Comment: Smokes 1-2 cigarettes a day  . Alcohol use 0.6 oz/week    1 Cans of beer per week     Comment: occassionally: weekends, 6-pack or less on a day; or half pint of liquior     Allergies   Apresoline [hydralazine] and Chantix [varenicline]   Review of Systems Review of Systems  Constitutional: Negative for activity change, appetite change, chills and fever.  HENT: Negative for facial swelling, sore throat and trouble swallowing.   Respiratory: Negative for chest tightness, shortness of breath and wheezing.   Musculoskeletal: Negative for neck pain and neck stiffness.  Skin: Positive for rash. Negative for wound.  Neurological: Negative for dizziness, weakness, numbness and headaches.  All other systems reviewed and are negative.    Physical Exam Updated Vital Signs BP 126/85   Pulse 93   Temp 99.2 F (37.3 C) (Oral)   Resp 20   Ht 5\' 7"  (1.702 m)   Wt 108 kg (238 lb)   SpO2 92%   BMI 37.28 kg/m   Physical Exam  Constitutional: He is oriented to person, place, and time. He appears well-developed and well-nourished. No distress.  HENT:  Head: Normocephalic and atraumatic.  Mouth/Throat: Oropharynx is clear and moist.  Neck: Normal range of motion. Neck supple.  Cardiovascular: Normal rate, regular rhythm and intact distal pulses.   No murmur heard. Pulmonary/Chest: Effort normal and breath sounds normal. No respiratory distress.  Abdominal: Soft. He exhibits no  distension. There is no tenderness.  Musculoskeletal: Normal range of motion. He exhibits no edema or tenderness.  Lymphadenopathy:    He has no cervical adenopathy.  Neurological: He is alert and oriented to person, place, and time. No sensory deficit. Coordination normal.  Skin: Skin is warm. Capillary refill takes less than 2 seconds. Rash noted. There is erythema.  Multiple erythematous papules to the bilateral lower extremities and abdomen.no vesicles or pustules. No induration  Nursing note and vitals reviewed.    ED Treatments / Results  Labs (all labs ordered are listed, but only abnormal results are displayed) Labs Reviewed - No data to display  EKG  EKG Interpretation None       Radiology No results found.  Procedures Procedures (including  critical care time)  Medications Ordered in ED Medications  predniSONE (DELTASONE) tablet 40 mg (40 mg Oral Given 07/31/17 1333)  diphenhydrAMINE (BENADRYL) capsule 25 mg (25 mg Oral Given 07/31/17 1333)     Initial Impression / Assessment and Plan / ED Course  I have reviewed the triage vital signs and the nursing notes.  Pertinent labs & imaging results that were available during my care of the patient were reviewed by me and considered in my medical decision making (see chart for details).     Patient well-appearing. Nontoxic. Multiple insect bites to the extremities. Likely bedbugs. Doubt infectious process. Patient stable for discharge.  Final Clinical Impressions(s) / ED Diagnoses   Final diagnoses:  Insect bite, initial encounter    New Prescriptions Discharge Medication List as of 07/31/2017  1:19 PM    START taking these medications   Details  hydrOXYzine (ATARAX/VISTARIL) 25 MG tablet Take 1 tablet (25 mg total) by mouth every 6 (six) hours. As needed for itching, Starting Thu 07/31/2017, Print    predniSONE (DELTASONE) 10 MG tablet Take 6 tablets day one, 5 tablets day two, 4 tablets day three, 3 tablets  day four, 2 tablets day five, then 1 tablet day six, Print         Kem Parkinson, PA-C 08/02/17 0958    Forde Dandy, MD 08/07/17 1410

## 2017-08-04 ENCOUNTER — Other Ambulatory Visit: Payer: Self-pay | Admitting: Family Medicine

## 2017-08-04 DIAGNOSIS — J438 Other emphysema: Secondary | ICD-10-CM

## 2017-08-06 ENCOUNTER — Telehealth: Payer: Self-pay | Admitting: Pulmonary Disease

## 2017-08-06 MED ORDER — ALBUTEROL SULFATE HFA 108 (90 BASE) MCG/ACT IN AERS: 1.0000 | INHALATION_SPRAY | Freq: Four times a day (QID) | RESPIRATORY_TRACT | 1 refills | Status: DC | PRN

## 2017-08-06 NOTE — Telephone Encounter (Signed)
Pt requesting albuterol sample.  I advised that we do not have albuterol samples in office.  rx sent to preferred pharmacy at pt request.  Nothing further needed.

## 2017-08-24 ENCOUNTER — Emergency Department (HOSPITAL_COMMUNITY)
Admission: EM | Admit: 2017-08-24 | Discharge: 2017-08-24 | Disposition: A | Discharge status: Home / Self Care | Payer: Medicare Other | Attending: Emergency Medicine | Admitting: Emergency Medicine

## 2017-08-24 ENCOUNTER — Emergency Department (HOSPITAL_COMMUNITY): Payer: Medicare Other

## 2017-08-24 ENCOUNTER — Encounter (HOSPITAL_COMMUNITY): Payer: Self-pay | Admitting: Emergency Medicine

## 2017-08-24 DIAGNOSIS — M545 Low back pain, unspecified: Secondary | ICD-10-CM

## 2017-08-24 DIAGNOSIS — F17210 Nicotine dependence, cigarettes, uncomplicated: Secondary | ICD-10-CM | POA: Diagnosis not present

## 2017-08-24 DIAGNOSIS — Z7982 Long term (current) use of aspirin: Secondary | ICD-10-CM | POA: Diagnosis not present

## 2017-08-24 DIAGNOSIS — I110 Hypertensive heart disease with heart failure: Secondary | ICD-10-CM | POA: Insufficient documentation

## 2017-08-24 DIAGNOSIS — J449 Chronic obstructive pulmonary disease, unspecified: Secondary | ICD-10-CM | POA: Diagnosis not present

## 2017-08-24 DIAGNOSIS — Z79899 Other long term (current) drug therapy: Secondary | ICD-10-CM | POA: Diagnosis not present

## 2017-08-24 DIAGNOSIS — I5031 Acute diastolic (congestive) heart failure: Secondary | ICD-10-CM | POA: Diagnosis not present

## 2017-08-24 DIAGNOSIS — Z9981 Dependence on supplemental oxygen: Secondary | ICD-10-CM | POA: Diagnosis not present

## 2017-08-24 LAB — CBC WITH DIFFERENTIAL/PLATELET
BASOS ABS: 0 10*3/uL (ref 0.0–0.1)
BASOS PCT: 0 %
EOS ABS: 0.2 10*3/uL (ref 0.0–0.7)
Eosinophils Relative: 2 %
HEMATOCRIT: 46.1 % (ref 39.0–52.0)
HEMOGLOBIN: 15.6 g/dL (ref 13.0–17.0)
Lymphocytes Relative: 30 %
Lymphs Abs: 3.9 10*3/uL (ref 0.7–4.0)
MCH: 32.2 pg (ref 26.0–34.0)
MCHC: 33.8 g/dL (ref 30.0–36.0)
MCV: 95.1 fL (ref 78.0–100.0)
MONO ABS: 0.9 10*3/uL (ref 0.1–1.0)
Monocytes Relative: 7 %
NEUTROS ABS: 7.7 10*3/uL (ref 1.7–7.7)
Neutrophils Relative %: 61 %
Platelets: 229 10*3/uL (ref 150–400)
RBC: 4.85 MIL/uL (ref 4.22–5.81)
RDW: 13.9 % (ref 11.5–15.5)
WBC: 12.7 10*3/uL — ABNORMAL HIGH (ref 4.0–10.5)

## 2017-08-24 LAB — URINALYSIS, ROUTINE W REFLEX MICROSCOPIC
BILIRUBIN URINE: NEGATIVE
Glucose, UA: NEGATIVE mg/dL
Hgb urine dipstick: NEGATIVE
KETONES UR: NEGATIVE mg/dL
LEUKOCYTES UA: NEGATIVE
NITRITE: NEGATIVE
PROTEIN: NEGATIVE mg/dL
Specific Gravity, Urine: 1.016 (ref 1.005–1.030)
pH: 7 (ref 5.0–8.0)

## 2017-08-24 LAB — BASIC METABOLIC PANEL
ANION GAP: 9 (ref 5–15)
BUN: 11 mg/dL (ref 6–20)
CALCIUM: 8.9 mg/dL (ref 8.9–10.3)
CO2: 30 mmol/L (ref 22–32)
CREATININE: 0.92 mg/dL (ref 0.61–1.24)
Chloride: 97 mmol/L — ABNORMAL LOW (ref 101–111)
GFR calc non Af Amer: >60 mL/min (ref >60)
Glucose, Bld: 91 mg/dL (ref 65–99)
Potassium: 4.3 mmol/L (ref 3.5–5.1)
SODIUM: 136 mmol/L (ref 135–145)

## 2017-08-24 MED ORDER — TRAMADOL HCL 50 MG PO TABS: 50.0000 mg | ORAL_TABLET | Freq: Four times a day (QID) | ORAL | 0 refills | Status: DC | PRN

## 2017-08-24 NOTE — Discharge Instructions (Signed)
As discussed, your evaluation today has been largely reassuring.  But, it is important that you monitor your condition carefully, and do not hesitate to return to the ED if you develop new, or concerning changes in your condition. ? ?Otherwise, please follow-up with your physician for appropriate ongoing care. ? ?

## 2017-08-24 NOTE — ED Provider Notes (Signed)
[facility] EMERGENCY DEPARTMENT Provider Note   CSN: [id number] Arrival date & time: 08/24/17  1530     History   Chief Complaint Chief Complaint  Patient presents with  . Back Pain    HPI Siler Mavis is a 56 y.o. male.  HPI Patient presents with concern of back pain. Patient acknowledges multiple medical issues including CHF, COPD, hypertension. He notes that over the past few days, possibly 1 week he has developed pain in consistently in the mid low back, around the sacroiliac joints bilaterally. Pain is seemingly worse with motion of standing, not necessarily worse with ambulation. Patient denies any urinary complaints. No fevers, no chills. Patient recently transition to this area from another region. Minimal relief with OTC medication. Past Medical History:  Diagnosis Date  . Arthritis   . CHF (congestive heart failure) ([place])   . CHF (congestive heart failure) ([place])   . COPD (chronic obstructive pulmonary disease) ([place]) Dx 2015  . Eczema    since childhood   . Hypertension   . Sleep apnea    CPAP    Patient Active Problem List   Diagnosis Date Noted  . Social alcohol use 05/29/2017  . Acute encephalopathy   . Cocaine abuse ([place]) 07/26/2016  . Depression 07/26/2016  . OSA on CPAP 07/26/2016  . CHF (congestive heart failure) ([place]) 06/27/2016  . Alcohol abuse 06/27/2016  . Acute diastolic (congestive) heart failure ([place]) 06/27/2016  . Elevated BP 08/24/2015  . Left elbow contusion 05/26/2015  . Healthcare maintenance 05/26/2015  . COPD (chronic obstructive pulmonary disease) ([place]) 08/23/2014  . Diastolic CHF ([place]) 08/67/6195  . Eczema 08/23/2014  . Right knee pain 08/23/2014  . Tobacco abuse 03/27/2014    Past Surgical History:  Procedure Laterality Date  . COLONOSCOPY WITH PROPOFOL N/A 06/25/2017   Procedure: COLONOSCOPY WITH PROPOFOL;  Surgeon: Daneil Dolin, MD;  [facility];  Service: Endoscopy;  Laterality: N/A;  2:00pm  . MULTIPLE  EXTRACTIONS WITH ALVEOLOPLASTY N/A 03/22/2016   Procedure: MULTIPLE EXTRACTION WITH ALVEOLOPLASTY;  Surgeon: Diona Browner, DDS;  Location: [place];  Service: Oral Surgery;  Laterality: N/A;  . NO PAST SURGERIES    . POLYPECTOMY  06/25/2017   Procedure: POLYPECTOMY;  Surgeon: Daneil Dolin, MD;  Location[facility];  Service: Endoscopy;;  colon       Home Medications    Prior to Admission medications   Medication Sig Start Date End Date Taking? Authorizing Provider  albuterol (VENTOLIN HFA) 108 (90 Base) MCG/ACT inhaler Inhale 1-2 puffs into the lungs every 6 (six) hours as needed for wheezing or shortness of breath. 08/06/17   Mannam, Hart Robinsons, MD  aspirin EC 81 MG tablet Take 1 tablet (81 mg total) by mouth daily. 09/20/15   Florencia Reasons, MD  DALIRESP 500 MCG TABS tablet Take 1 tablet (500 mcg total) by mouth daily. 02/25/17   Funches, Adriana Mccallum, MD  fluticasone (FLONASE) 50 MCG/ACT nasal spray Place 2 sprays into both nostrils daily. 06/26/17   Tresa Garter, MD  furosemide (LASIX) 40 MG tablet Take 1 tablet (40 mg total) by mouth 2 (two) times daily. 07/01/17   Tresa Garter, MD  Guaifenesin (MUCINEX MAXIMUM STRENGTH) 1200 MG TB12 Take 1,200 mg by mouth daily.    [provider]  HYDROcodone-acetaminophen (NORCO/VICODIN) 5-325 MG tablet Take 1 tablet by mouth every 6 (six) hours as needed for moderate pain.    [provider]  hydrOXYzine (ATARAX/VISTARIL) 25 MG tablet Take 1 tablet (  25 mg total) by mouth every 6 (six) hours. As needed for itching 07/31/17   Triplett, Tammy, PA-C  OXYGEN Inhale 2 L into the lungs continuous.    [provider]  potassium chloride 20 MEQ TBCR Take 20 mEq by mouth 2 (two) times daily. 01/02/17   Funches, Adriana Mccallum, MD  predniSONE (DELTASONE) 10 MG tablet Take 6 tablets day one, 5 tablets day two, 4 tablets day three, 3 tablets day four, 2 tablets day five, then 1 tablet day six 07/31/17   Triplett, Tammy, PA-C  roflumilast  (DALIRESP) 500 MCG TABS tablet Take 1 tablet (500 mcg total) by mouth daily. 07/03/17   Magdalen Spatz, NP    Family History Family History  Problem Relation Age of Onset  . Cerebral aneurysm Father   . Cancer Neg Hx   . Diabetes Neg Hx   . Heart disease Neg Hx     Social History Social History  Substance Use Topics  . Smoking status: Light Tobacco Smoker    Packs/day: 0.25    Years: 30.00    Types: Cigarettes  . Smokeless tobacco: Never Used     Comment: Smokes 1-2 cigarettes a day  . Alcohol use 0.6 oz/week    1 Cans of beer per week     Comment: occassionally: weekends, 6-pack or less on a day; or half pint of liquior     Allergies   Apresoline [hydralazine] and Chantix [varenicline]   Review of Systems Review of Systems  Constitutional:       Per HPI, otherwise negative  HENT:       Per HPI, otherwise negative  Respiratory:       Per HPI, otherwise negative  Cardiovascular:       Per HPI, otherwise negative  Gastrointestinal: Negative for vomiting.  Endocrine:       Negative aside from HPI  Genitourinary:       Neg aside from HPI   Musculoskeletal:       Per HPI, otherwise negative  Skin: Negative.   Neurological: Negative for syncope.     Physical Exam Updated Vital Signs BP (!) 158/83 (BP Location: Right Arm)   Pulse 93   Temp (S) 99.6 F (37.6 C) (Oral)   Resp 18   Ht 5\' 7"  (1.702 m)   Wt 108 kg (238 lb)   SpO2 97%   BMI 37.28 kg/m   Physical Exam  Constitutional: He is oriented to person, place, and time. He appears well-developed. No distress.  HENT:  Head: Normocephalic and atraumatic.  Eyes: Conjunctivae and EOM are normal.  Cardiovascular: Normal rate and regular rhythm.   Pulmonary/Chest: Effort normal. No stridor. No respiratory distress.  Abdominal: He exhibits no distension.  Musculoskeletal: He exhibits no edema.       Arms: Neurological: He is alert and oriented to person, place, and time. He displays no atrophy and no  tremor. No cranial nerve deficit. He exhibits normal muscle tone. He displays no seizure activity. Coordination and gait normal.  Skin: Skin is warm and dry.  Psychiatric: He has a normal mood and affect.  Nursing note and vitals reviewed.    ED Treatments / Results  Labs (all labs ordered are listed, but only abnormal results are displayed) Labs Reviewed  CBC WITH DIFFERENTIAL/PLATELET - Abnormal; Notable for the following:       Result Value   WBC 12.7 (*)    All other components within normal limits  BASIC METABOLIC PANEL - Abnormal;  Notable for the following:    Chloride 97 (*)    All other components within normal limits  URINALYSIS, ROUTINE W REFLEX MICROSCOPIC    EKG  EKG Interpretation None       Radiology Dg Lumbar Spine Complete  Result Date: 08/24/2017 CLINICAL DATA:  Low back pain radiating into right groin for 1 week. "Rule out lytic lesions" . EXAM: LUMBAR SPINE - COMPLETE 4+ VIEW COMPARISON:  None. FINDINGS: Five lumbar type vertebral bodies. Sacroiliac joints are symmetric. Maintenance of vertebral body height. Straightening of expected lordosis. Endplate osteophytes with minimal loss of intervertebral disc height at L4-5. Facet arthropathy at L5-S1. Aortic atherosclerosis. No focal osseous lesion. IMPRESSION: Relatively mild lumbar spondylosis with nonspecific straightening of expected lordosis. No focal liver lesions identified. Aortic Atherosclerosis (ICD10-I70.0). Electronically Signed   By: Abigail Miyamoto M.D.   On: 08/24/2017 21:54    Procedures Procedures (including critical care time)  Medications Ordered in ED Medications - No data to display   Initial Impression / Assessment and Plan / ED Course  I have reviewed the triage vital signs and the nursing notes.  Pertinent labs & imaging results that were available during my care of the patient were reviewed by me and considered in my medical decision making (see chart for details).  Patient presents  with concern of midline low back pain, atraumatic onset. Some suspicion for lytic disease given the elevated leukocytosis, no evidence for urinary tract infection, the patient had x-ray performed.  This did not demonstrate lytic lesions. With reassuring x-ray, labs, urinalysis, patient discharged with a course of analgesics to follow-up with primary care for consideration of physical therapy.  Final Clinical Impressions(s) / ED Diagnoses  Acute low back pain   Carmin Muskrat, MD 08/24/17 2159

## 2017-08-24 NOTE — ED Triage Notes (Signed)
Patient c/o lower back pain that radiates into right groin x1 week. Patient states that pain is relieved at times after voiding. Denies any dysuria or hematuria. Patient states originally thought it was because he has been sleeping on air matress but now states he thinks it's related to kidneys. Patient states that he was taking Lasix but stopped taking it 3 days ago. Denies any retention or swelling.

## 2017-08-26 ENCOUNTER — Other Ambulatory Visit: Payer: Self-pay | Admitting: Internal Medicine

## 2017-08-26 DIAGNOSIS — I5032 Chronic diastolic (congestive) heart failure: Secondary | ICD-10-CM

## 2017-08-29 ENCOUNTER — Telehealth: Payer: Self-pay | Admitting: Pulmonary Disease

## 2017-08-29 NOTE — Telephone Encounter (Signed)
Spoke with patient. He is requesting a letter stating the nature of his medical condition for disability. He is currently using a CPAP at night with oyxgen. He has a history of OSA and pulmonary emphysema.   PM, please advise on his letter. Thanks!

## 2017-09-01 NOTE — Telephone Encounter (Signed)
Patient states he called the disability office, and he knows exactly what is needed and would like to speak to nurse to pass on the information..the patient contact # [phone]

## 2017-09-01 NOTE — Telephone Encounter (Signed)
Pt called back about the status of this message

## 2017-09-01 NOTE — Telephone Encounter (Signed)
ATC pt, no answer. Left message for pt to call back.  

## 2017-09-01 NOTE — Telephone Encounter (Signed)
Spoke with the pt  He states that the letter needs to state what condition he is being treated for, that he is on o2, CPAP, and also needs to list the medications that he is taking for his condition He also needs the date listed that he was first seen here  Thanks

## 2017-09-01 NOTE — Telephone Encounter (Signed)
Spoke with pt, who states last OV note work. Pt states letter is needed that states pt work restrictions.  Pt wishes to pick letter and last two office notes up once letter is complete.   PM please advise. Thanks.

## 2017-09-01 NOTE — Telephone Encounter (Signed)
Pt returning call and works outside so can we please leave a detailed message.  [phone]

## 2017-09-01 NOTE — Telephone Encounter (Signed)
See if we can send the last couple of clinic notes as a documentation of his medical condition. Thanks   Marshell Garfinkel MD [facility] 09/01/2017, 7:49 AM

## 2017-09-04 NOTE — Telephone Encounter (Signed)
Letter has been written and signed by PM, as well as printed last two office notes.  Pt is aware and voiced his understanding. Letter has been placed up front in brown folder for pickup. Nothing further needed.

## 2017-09-04 NOTE — Telephone Encounter (Signed)
Okay to give a letter stating that he is being treated for severe COPD, severe obstructive sleep apnea, pulmonary hypertension.   Add that he is on oxygen and a list of his medication. Add the date that he was seen here first. Thanks  Marshell Garfinkel MD [facility] 09/04/2017, 11:40 AM

## 2017-09-04 NOTE — Telephone Encounter (Signed)
Spoke with pt, verified that he still wants to pick up the letter/ov notes from our office, but wanted to make [place] aware that he needs to mail it to PA and it needs to be received by 11/12.  Pt is requesting this letter ASAP.     PM please advise when letter is ready.  Thanks!

## 2017-09-04 NOTE — Telephone Encounter (Signed)
Pt called back about the status of this message, the letter/documentation will need to be sent back to the main office (which is in [place])  by 09/15/17.Marland KitchenMarland Kitchen

## 2017-09-10 ENCOUNTER — Encounter: Payer: Self-pay | Admitting: Pulmonary Disease

## 2017-09-10 ENCOUNTER — Ambulatory Visit (INDEPENDENT_AMBULATORY_CARE_PROVIDER_SITE_OTHER): Payer: Medicare Other | Admitting: Pulmonary Disease

## 2017-09-10 ENCOUNTER — Telehealth: Payer: Self-pay | Admitting: Pulmonary Disease

## 2017-09-10 VITALS — BP 140/90 | HR 91 | Ht 67.0 in | Wt 245.0 lb

## 2017-09-10 DIAGNOSIS — G4733 Obstructive sleep apnea (adult) (pediatric): Secondary | ICD-10-CM | POA: Diagnosis not present

## 2017-09-10 DIAGNOSIS — J438 Other emphysema: Secondary | ICD-10-CM | POA: Diagnosis not present

## 2017-09-10 MED ORDER — UMECLIDINIUM-VILANTEROL 62.5-25 MCG/INH IN AEPB: 1.0000 | INHALATION_SPRAY | Freq: Every day | RESPIRATORY_TRACT | 3 refills | Status: DC

## 2017-09-10 NOTE — Telephone Encounter (Signed)
PA for Anoro Ellipta initiated today - [id number]

## 2017-09-10 NOTE — Progress Notes (Signed)
Ricardo Burch    [id number]    03-22-1961  Primary Care Physician:Burch, Ricardo Mccallum, MD  Referring Physician: Boykin Nearing, MD No address on file  Chief complaint:   Follow up for Severe COPD Severe OSA on autoset Active smoker  HPI: [name] is a 56 year old active smoker, COPD. He's had multiple hospitalizations with COPD, CHF exacerbations in the past.He was hospitalized from 07/26/16-07/30/16 with acute pulmonary edema, acute exacerbation of COPD. He was intubated briefly for hypercarbia.   He has finished the pulmonary rehab and reports improved dyspnea. He has been on a rotating list of inhalers mainly due to his inability to pay co-pay. He is now off Advair/Symbicort. His Spiriva has been replaced by incruse. He is using his albuterol rescue inhaler 1-2 times a week. He has been started on CPAP after a repeat titration study and is tolerating it well.  Interim History: He is stable since his last clinic visit .  Still has dyspnea with exertion, cough with congestion which is unchanged from baseline.  No new complaints today  Outpatient Encounter Medications as of 09/10/2017  Medication Sig  . albuterol (VENTOLIN HFA) 108 (90 Base) MCG/ACT inhaler Inhale 1-2 puffs into the lungs every 6 (six) hours as needed for wheezing or shortness of breath.  [name] aspirin EC 81 MG tablet Take 1 tablet (81 mg total) by mouth daily.  [name] DALIRESP 500 MCG TABS tablet Take 1 tablet (500 mcg total) by mouth daily.  . Guaifenesin (MUCINEX MAXIMUM STRENGTH) 1200 MG TB12 Take 1,200 mg by mouth daily.  [name] HYDROcodone-acetaminophen (NORCO/VICODIN) 5-325 MG tablet Take 1 tablet by mouth every 6 (six) hours as needed for moderate pain.  . hydrOXYzine (ATARAX/VISTARIL) 25 MG tablet Take 1 tablet (25 mg total) by mouth every 6 (six) hours. As needed for itching  . OXYGEN Inhale 2 L into the lungs continuous.  . potassium chloride 20 MEQ TBCR Take 20 mEq by mouth 2 (two) times daily.  . roflumilast  (DALIRESP) 500 MCG TABS tablet Take 1 tablet (500 mcg total) by mouth daily.  . [DISCONTINUED] predniSONE (DELTASONE) 10 MG tablet Take 6 tablets day one, 5 tablets day two, 4 tablets day three, 3 tablets day four, 2 tablets day five, then 1 tablet day six  . [DISCONTINUED] traMADol (ULTRAM) 50 MG tablet Take 1 tablet (50 mg total) by mouth every 6 (six) hours as needed.  . fluticasone (FLONASE) 50 MCG/ACT nasal spray Place 2 sprays into both nostrils daily. (Patient not taking: Reported on 09/10/2017)  . furosemide (LASIX) 40 MG tablet Take 1 tablet (40 mg total) by mouth 2 (two) times daily. (Patient not taking: Reported on 09/10/2017)   Facility-Administered Encounter Medications as of 09/10/2017  Medication  . umeclidinium bromide (INCRUSE ELLIPTA) 62.5 MCG/INH 1 puff    Allergies as of 09/10/2017 - Review Complete 09/10/2017  Allergen Reaction Noted  . Apresoline [hydralazine] Other (See Comments) 07/10/2016  . Chantix [varenicline] Other (See Comments) 01/20/2017    Past Medical History:  Diagnosis Date  . Arthritis   . CHF (congestive heart failure) ([place])   . CHF (congestive heart failure) ([place])   . COPD (chronic obstructive pulmonary disease) ([place]) Dx 2015  . Eczema    since childhood   . Hypertension   . Sleep apnea    CPAP    Past Surgical History:  Procedure Laterality Date  . NO PAST SURGERIES      Family History  Problem Relation Age of  Onset  . Cerebral aneurysm Father   . Cancer Neg Hx   . Diabetes Neg Hx   . Heart disease Neg Hx     Social History   Socioeconomic History  . Marital status: Single    Spouse name: Not on file  . Number of children: 4   . Years of education: 44  . Highest education level: Not on file  Social Needs  . Financial resource strain: Not on file  . Food insecurity - worry: Not on file  . Food insecurity - inability: Not on file  . Transportation needs - medical: Not on file  . Transportation needs - non-medical: Not on file    Occupational History  . Occupation: Unemployed   Tobacco Use  . Smoking status: Light Tobacco Smoker    Packs/day: 0.25    Years: 30.00    Pack years: 7.50    Types: Cigarettes  . Smokeless tobacco: Never Used  . Tobacco comment: Smokes 1-2 cigarettes a day  Substance and Sexual Activity  . Alcohol use: Yes    Alcohol/week: 0.6 oz    Types: 1 Cans of beer per week    Comment: occassionally: weekends, 6-pack or less on a day; or half pint of liquior  . Drug use: Yes    Types: Marijuana, Cocaine    Comment: used in the last week  . Sexual activity: No    Comment: pt. graduzlly cutting back using chan  Other Topics Concern  . Not on file  Social History Narrative    Lives alone.    4 daughters 89, 65 yo twins, eldest married live in [place].     Review of systems: Review of Systems  Constitutional: Negative for fever and chills.  HENT: Negative.   Eyes: Negative for blurred vision.  Respiratory: as per HPI  Cardiovascular: Negative for chest pain and palpitations.  Gastrointestinal: Negative for vomiting, diarrhea, blood per rectum. Genitourinary: Negative for dysuria, urgency, frequency and hematuria.  Musculoskeletal: Negative for myalgias, back pain and joint pain.  Skin: Negative for itching and rash.  Neurological: Negative for dizziness, tremors, focal weakness, seizures and loss of consciousness.  Endo/Heme/Allergies: Negative for environmental allergies.  Psychiatric/Behavioral: Negative for depression, suicidal ideas and hallucinations.  All other systems reviewed and are negative.   Physical Exam: Blood pressure 140/90, pulse 91, height 5\' 7"  (1.702 m), weight 111.1 kg (245 lb), SpO2 93 %. Gen:      No acute distress HEENT:  EOMI, sclera anicteric Neck:     No masses; no thyromegaly Lungs:    Clear to auscultation bilaterally; normal respiratory effort CV:         Regular rate and rhythm; no murmurs Abd:      + bowel sounds; soft, non-tender; no palpable  masses, no distension Ext:    No edema; adequate peripheral perfusion Skin:      Warm and dry; no rash Neuro: alert and oriented x 3 Psych: normal mood and affect  Data Reviewed: CXR  (07/28/16) No acute cardiopulmonary process Images reviewed. CXR 11/04/16 Stable hyperinflation. Images reviewed.  Echo (09/18/15) The right ventricular systolic pressure was increased consistent with moderate pulmonary hypertension.moderate concentric LVH. LVEF 60-65 percent. Grade 2 diastolic dysfunction. RV cavity size severe grade dilated. RV systolic function moderately to severely reduced. PA pressure 56  Sleep study 10/22/15 guarded times severe OSA, AHI 151. Started on an AutoSet CPAP titration study  Initiate CPAP at 12 with 2 L oxygen, fullface mask.  CPAP  compliance report 06/23/16- 07/22/16 3% usage greater than 4 hours.  PFTs 01/16/16 FVC 2.43 [58%], FEV1 1.49 [45%), F/F 61, TLC 81%,DLCO 56% Severe obstruction, moderate reduction in diffusion capacity.  CBC 08/24/17-absolute eosinophil count 254  Assessment:  #1 COPD Severe COPD. His symptoms are currently stable and he continues Daliresp, incruse.  Will change incruse to anoro Use supplemental O2 with exertion  #2 Severe OSA Continue CPAP at current setting of 12  #3 Tobacco use He continues to smoke in spite of attempts to quit.  Smoking cessation emphasized   #4 Pulmonary hypertension Likely secondary to COPD and OSA. We will continue to monitor.  Plan/Recommendations:0 - Stop incruse.  Start anoro - Continue albuterol PRN, supplemental O2 - Continue daliresp - Continue CPAP  Marshell Garfinkel MD [facility] Pager [phone] 09/10/2017, 9:06 AM  CC: Ricardo Nearing, MD

## 2017-09-10 NOTE — Patient Instructions (Signed)
We will stop the incruse and start you anoro Continue using Daliresp Continue using the CPAP Return to clinic in 6 months.

## 2017-09-11 ENCOUNTER — Ambulatory Visit: Payer: Medicare Other | Admitting: Internal Medicine

## 2017-09-12 NOTE — Telephone Encounter (Signed)
Per CMM, PA was canceled on 09/10/17. Spoke to [place] with Medicare, who states are medicare does not do PA. Lennette Bihari was unable to provide me with alternatives, as he stated that is the providers preference.   PM please advise. Thanks

## 2017-09-15 ENCOUNTER — Ambulatory Visit (INDEPENDENT_AMBULATORY_CARE_PROVIDER_SITE_OTHER): Payer: Medicare Other | Admitting: Family Medicine

## 2017-09-15 ENCOUNTER — Other Ambulatory Visit: Payer: Self-pay

## 2017-09-15 ENCOUNTER — Telehealth: Payer: Self-pay | Admitting: Family Medicine

## 2017-09-15 ENCOUNTER — Encounter: Payer: Self-pay | Admitting: Family Medicine

## 2017-09-15 VITALS — BP 158/86 | HR 82 | Temp 98.8°F | Resp 20 | Ht 67.0 in | Wt 244.0 lb

## 2017-09-15 DIAGNOSIS — J438 Other emphysema: Secondary | ICD-10-CM

## 2017-09-15 DIAGNOSIS — I700 Atherosclerosis of aorta: Secondary | ICD-10-CM | POA: Diagnosis not present

## 2017-09-15 DIAGNOSIS — Z23 Encounter for immunization: Secondary | ICD-10-CM

## 2017-09-15 DIAGNOSIS — Z720 Tobacco use: Secondary | ICD-10-CM | POA: Diagnosis not present

## 2017-09-15 DIAGNOSIS — R7303 Prediabetes: Secondary | ICD-10-CM | POA: Diagnosis not present

## 2017-09-15 NOTE — Telephone Encounter (Signed)
Mailed lab orders, due before next office visit.

## 2017-09-15 NOTE — Progress Notes (Signed)
Chief Complaint  Patient presents with  . COPD   This is a new patient to establish. He has moved to [place] in this office is more convenient for him. He is under the care of a pulmonary office from [facility], and will continue to attend visits there. He sees Dr. Sydell Axon for GI medicine.  He had a colonoscopy performed in August 2018.  He states that polyps were identified. He has a history of alcohol abuse, cocaine abuse, and tobacco abuse.  He has been trying to quit smoking and he is currently using the patch.  He still drinks, he believes in moderation.  He denies cocaine use although positive cocaine drug screen screen is noted in the chart. He complains of chronic back pain.  He says that he wishes he could get opiates.  I explained to him that because of his history of addiction he was a poor candidate for opiates.  I did explain to him that there are other medicines that will treat low back pain and that these can be made available to him over time. Review of his chart indicates that he has aortic atherosclerosis.  This was seen on his lumbar spine x-ray from last month.  He seems unaware of this.  I do not see lipids in his chart.  We will check a lipid panel and treat if appropriate.  He needs to take an aspirin a day and is told this. Review of his chart indicates that he had a hemoglobin A1c of 6.0.  I told him this indicates he has prediabetes.  He seems unaware of this fact as well.  We will repeat a hemoglobin A1c with today's lab work.  He has never been screened for hepatitis C.  He states he has never done IV drugs and considers himself low risk.  He agrees to hepatitis C screening He has obstructive sleep apnea and is compliant with CPAP. He had a flu shot.  He has not had a pneumonia shot.  He is going to get this today.   Patient Active Problem List   Diagnosis Date Noted  . Aortic atherosclerosis ([place]) 09/15/2017  . Pre-diabetes 09/15/2017  . Cocaine abuse ([place])  07/26/2016  . Depression 07/26/2016  . OSA on CPAP 07/26/2016  . CHF (congestive heart failure) ([place]) 06/27/2016  . Alcohol abuse 06/27/2016  . Elevated BP 08/24/2015  . COPD (chronic obstructive pulmonary disease) ([place]) 08/23/2014  . Eczema 08/23/2014  . Right knee pain 08/23/2014  . Tobacco abuse 03/27/2014    Outpatient Encounter Medications as of 09/15/2017  Medication Sig  . albuterol (VENTOLIN HFA) 108 (90 Base) MCG/ACT inhaler Inhale 1-2 puffs into the lungs every 6 (six) hours as needed for wheezing or shortness of breath.  [name] aspirin EC 81 MG tablet Take 1 tablet (81 mg total) by mouth daily.  [name] DALIRESP 500 MCG TABS tablet Take 1 tablet (500 mcg total) by mouth daily.  . Guaifenesin (MUCINEX MAXIMUM STRENGTH) 1200 MG TB12 Take 1,200 mg by mouth daily.  . OXYGEN Inhale 2 L into the lungs continuous.   Facility-Administered Encounter Medications as of 09/15/2017  Medication  . umeclidinium bromide (INCRUSE ELLIPTA) 62.5 MCG/INH 1 puff    Past Medical History:  Diagnosis Date  . Allergy   . CHF (congestive heart failure) ([place])   . CHF (congestive heart failure) ([place])   . Chronic kidney disease   . COPD (chronic obstructive pulmonary disease) ([place]) Dx 2015  . Depression   . Eczema  since childhood   . Hypertension   . Oxygen deficiency   . Sleep apnea    CPAP  . Substance abuse ([place])    MJ, cocaine    Past Surgical History:  Procedure Laterality Date  . NO PAST SURGERIES      Social History   Socioeconomic History  . Marital status: Single    Spouse name: Not on file  . Number of children: 5  . Years of education: 21  . Highest education level: Not on file  Social Needs  . Financial resource strain: Not on file  . Food insecurity - worry: Not on file  . Food insecurity - inability: Not on file  . Transportation needs - medical: Not on file  . Transportation needs - non-medical: Not on file  Occupational History  . Occupation: Unemployed      Comment: car wash details  Tobacco Use  . Smoking status: Light Tobacco Smoker    Packs/day: 0.25    Years: 30.00    Pack years: 7.50    Types: Cigarettes  . Smokeless tobacco: Never Used  . Tobacco comment: using a patch  Substance and Sexual Activity  . Alcohol use: Yes    Alcohol/week: 0.6 oz    Types: 1 Cans of beer per week    Comment: occassionally: weekends, 6-pack or less on a day; or half pint of liquior  . Drug use: Yes    Types: Marijuana, Cocaine    Comment: used in the last week  . Sexual activity: No  Other Topics Concern  . Not on file  Social History Narrative    Lives alone.    5 daughters 66, 67 yo twins, eldest 2 are married live in [place].    Works part time in a car wash    Family History  Problem Relation Age of Onset  . Arthritis Mother   . Hypertension Mother   . Stroke Mother        age 26  . Cerebral aneurysm Father   . Alcohol abuse Father   . Cancer Brother   . Diabetes Neg Hx   . Heart disease Neg Hx     Review of Systems  Constitutional: Negative for chills, fever and weight loss.  HENT: Negative for congestion and hearing loss.   Eyes: Negative for blurred vision and pain.  Respiratory: Positive for cough and shortness of breath.   Cardiovascular: Negative for chest pain and leg swelling.  Gastrointestinal: Positive for heartburn. Negative for abdominal pain, constipation and diarrhea.  Genitourinary: Negative for dysuria and frequency.  Musculoskeletal: Positive for back pain. Negative for falls, joint pain and myalgias.  Neurological: Negative for dizziness, seizures and headaches.  Psychiatric/Behavioral: Positive for substance abuse. Negative for depression. The patient is not nervous/anxious and does not have insomnia.     BP (!) 158/86 (BP Location: Left Arm, Patient Position: Sitting, Cuff Size: Large)   Pulse 82   Temp 98.8 F (37.1 C) (Temporal)   Resp 20   Ht 5\' 7"  (1.702 m)   Wt 244 lb 0.6 oz (110.7 kg)   SpO2  93%   BMI 38.22 kg/m   Physical Exam  1. Aortic atherosclerosis ([place]) On x-ray  2. Pre-diabetes On chart review - CBC - COMPLETE METABOLIC PANEL WITH GFR - Lipid panel - Urinalysis, Routine w reflex microscopic - Hemoglobin A1c  3. Need for influenza vaccination - Flu Vaccine QUAD 36+ mos IM  4. Need for pneumococcal vaccination - Pneumococcal  conjugate vaccine 13-valent IM  5. Other emphysema ([place]) Continue under care of pulmonary medicine  6. Tobacco abuse Congratulated on quitting and using a patch.   Patient Instructions  Need lab testing I will send you a letter with your test results.  If there is anything of concern, we will call right away.  Flu shot ant prevnar today I am glad that you are quitting smoking  See me in a month for a PE Call sooner for problems   Raylene Everts, MD

## 2017-09-15 NOTE — Patient Instructions (Addendum)
Need lab testing I will send you a letter with your test results.  If there is anything of concern, we will call right away.  Flu shot ant prevnar today I am glad that you are quitting smoking  See me in a month for a PE Call sooner for problems

## 2017-09-16 ENCOUNTER — Ambulatory Visit: Payer: Medicare Other | Admitting: Internal Medicine

## 2017-09-16 NOTE — Telephone Encounter (Signed)
Please see if Stiolto is covered. Thanks  Marshell Garfinkel MD [facility] 09/16/2017, 2:54 PM

## 2017-09-26 ENCOUNTER — Other Ambulatory Visit: Payer: Self-pay | Admitting: Internal Medicine

## 2017-09-26 DIAGNOSIS — I5032 Chronic diastolic (congestive) heart failure: Secondary | ICD-10-CM

## 2017-09-29 ENCOUNTER — Telehealth: Payer: Self-pay | Admitting: Pulmonary Disease

## 2017-09-29 NOTE — Telephone Encounter (Signed)
Spoke with pt, he wanted to make sure he could take Mucinex with Anoro and Daliresp. I advised pt it is ok to take it together and that it should not interact with each other. Pt understood and nothing further is needed.

## 2017-10-14 ENCOUNTER — Ambulatory Visit (INDEPENDENT_AMBULATORY_CARE_PROVIDER_SITE_OTHER): Payer: Medicare Other | Admitting: Family Medicine

## 2017-10-14 ENCOUNTER — Encounter: Payer: Self-pay | Admitting: Family Medicine

## 2017-10-14 ENCOUNTER — Other Ambulatory Visit: Payer: Self-pay

## 2017-10-14 VITALS — BP 140/96 | HR 80 | Temp 98.6°F | Resp 18 | Ht 67.0 in | Wt 243.0 lb

## 2017-10-14 DIAGNOSIS — Z0000 Encounter for general adult medical examination without abnormal findings: Secondary | ICD-10-CM | POA: Diagnosis not present

## 2017-10-14 DIAGNOSIS — R7303 Prediabetes: Secondary | ICD-10-CM

## 2017-10-14 DIAGNOSIS — I5032 Chronic diastolic (congestive) heart failure: Secondary | ICD-10-CM

## 2017-10-14 DIAGNOSIS — R350 Frequency of micturition: Secondary | ICD-10-CM

## 2017-10-14 DIAGNOSIS — Z720 Tobacco use: Secondary | ICD-10-CM | POA: Diagnosis not present

## 2017-10-14 DIAGNOSIS — N401 Enlarged prostate with lower urinary tract symptoms: Secondary | ICD-10-CM

## 2017-10-14 DIAGNOSIS — J438 Other emphysema: Secondary | ICD-10-CM

## 2017-10-14 DIAGNOSIS — I10 Essential (primary) hypertension: Secondary | ICD-10-CM

## 2017-10-14 DIAGNOSIS — I700 Atherosclerosis of aorta: Secondary | ICD-10-CM | POA: Diagnosis not present

## 2017-10-14 DIAGNOSIS — Z23 Encounter for immunization: Secondary | ICD-10-CM | POA: Diagnosis not present

## 2017-10-14 MED ORDER — AMLODIPINE BESYLATE 5 MG PO TABS: 5.0000 mg | ORAL_TABLET | Freq: Every day | ORAL | 3 refills | Status: DC

## 2017-10-14 MED ORDER — ATORVASTATIN CALCIUM 40 MG PO TABS: 40.0000 mg | ORAL_TABLET | Freq: Every day | ORAL | 3 refills | Status: DC

## 2017-10-14 NOTE — Progress Notes (Signed)
Chief Complaint  Patient presents with  . Annual Exam   Patient is here for a physical examination. He has elevated blood pressure.  In his chart is documented he has hypertension.  He previously took amlodipine.  He stopped this medication for unknown reasons.  Today he is put back on his amlodipine with instructions to take it daily. Patient has a history of congestive heart failure and hyperlipidemia.  He has a history of aortic atherosclerosis.  He previously was on a statin medication.  He is not taking this, again, for unknown reason.  He is placed back on atorvastatin 40 mg a day with instructions to take it daily. Patient is reminded to take a baby aspirin a day. He is compliant with his COPD medications and his CPAP. He is trying to be more careful with his diet.  We discussed that he has prediabetes.  We discussed that it is important that he reduce the sweets in his diet, and monitor his carbohydrates.  I recommend referral to a diabetes educator.  This referral is placed today. Patient has a history of congestive heart failure.  He has not under the care of a cardiologist.  He needs to see a cardiologist at least yearly, even if he is feeling well.  He is not having any symptoms associated with heart failure at this time.  He does have chronic dyspnea, but feels this is stable for his COPD. Patient is up-to-date with his immunizations. Hepatitis C is ordered today.  This is a high risk individual with a history of drug abuse alcohol abuse and tobacco abuse. Patient is trying to quit smoking.  He is using his patches.  He is successful in reducing but not entirely quitting.  The importance of tobacco cessation is discussed with him especially with his known heart and lung disease. We discussed cancer screening.  He has had colon cancer screening.  He does not think he has ever had prostate cancer screening.  There is no prostate cancer in his family.  He has noticed increased urinary  frequency and nocturia over the last several years.  After discussion, he chooses to have a PSA drawn with his laboratory today.  Patient Active Problem List   Diagnosis Date Noted  . Hypertension 10/14/2017  . Aortic atherosclerosis ([place]) 09/15/2017  . Pre-diabetes 09/15/2017  . Cocaine abuse ([place]) 07/26/2016  . Depression 07/26/2016  . OSA on CPAP 07/26/2016  . CHF (congestive heart failure) ([place]) 06/27/2016  . Alcohol abuse 06/27/2016  . COPD (chronic obstructive pulmonary disease) ([place]) 08/23/2014  . Eczema 08/23/2014  . Right knee pain 08/23/2014  . Tobacco abuse 03/27/2014    Outpatient Encounter Medications as of 10/14/2017  Medication Sig  . albuterol (VENTOLIN HFA) 108 (90 Base) MCG/ACT inhaler Inhale 1-2 puffs into the lungs every 6 (six) hours as needed for wheezing or shortness of breath.  [name] aspirin EC 81 MG tablet Take 1 tablet (81 mg total) by mouth daily.  [name] DALIRESP 500 MCG TABS tablet Take 1 tablet (500 mcg total) by mouth daily.  . Guaifenesin (MUCINEX MAXIMUM STRENGTH) 1200 MG TB12 Take 1,200 mg by mouth daily.  . OXYGEN Inhale 2 L into the lungs continuous.  [name] amLODipine (NORVASC) 5 MG tablet Take 1 tablet (5 mg total) by mouth daily.  [name] atorvastatin (LIPITOR) 40 MG tablet Take 1 tablet (40 mg total) by mouth daily.   Facility-Administered Encounter Medications as of 10/14/2017  Medication  . umeclidinium bromide (INCRUSE ELLIPTA)  62.5 MCG/INH 1 puff    Allergies  Allergen Reactions  . Apresoline [Hydralazine] Other (See Comments)    Headache   . Chantix [Varenicline] Other (See Comments)    Bad, vivid dreams    Review of Systems  Constitutional: Negative for activity change, appetite change and unexpected weight change.  HENT: Negative.  Negative for congestion and dental problem.        Dentures  Eyes: Negative for photophobia and visual disturbance.       Regular eye exam, wears contact lens  Respiratory: Positive for cough and shortness of  breath. Negative for wheezing.        Stable  Cardiovascular: Negative for chest pain, palpitations and leg swelling.  Gastrointestinal: Negative for abdominal distention, blood in stool, constipation and diarrhea.  Genitourinary: Negative for difficulty urinating and frequency.  Musculoskeletal: Positive for back pain. Negative for arthralgias.       Intermittent back pain  Skin: Negative for color change and rash.  Neurological: Negative for dizziness and headaches.  Psychiatric/Behavioral: Negative for dysphoric mood. The patient is not nervous/anxious.     BP (!) 140/96   Pulse 80   Temp 98.6 F (37 C) (Temporal)   Resp 18   Ht 5\' 7"  (1.702 m)   Wt 243 lb (110.2 kg)   SpO2 93%   BMI 38.06 kg/m   Physical Exam  BP (!) 140/96   Pulse 80   Temp 98.6 F (37 C) (Temporal)   Resp 18   Ht 5\' 7"  (1.702 m)   Wt 243 lb (110.2 kg)   SpO2 93%   BMI 38.06 kg/m   General Appearance:    Alert, cooperative, no distress, appears stated age.  Moderately obese  Head:    Normocephalic, without obvious abnormality, atraumatic  Eyes:    PERRL, conjunctiva/corneas clear, EOM's intact, fundi    benign, both eyes       Ears:    Normal TM's and external ear canals, both ears  Nose:   Nares normal, septum midline, mucosa normal, no drainage   or sinus tenderness  Throat:   Lips, mucosa, and tongue normal;gums normal  Neck:   Supple, symmetrical, trachea midline, no adenopathy;       thyroid:  No enlargement/tenderness/nodules; no carotid   bruit or JVD  Back:     Symmetric, no curvature, ROM normal, no CVA tenderness  Lungs:     Clear to auscultation bilaterally, respirations unlabored, rhonchi, no wheeze  Chest wall:    No tenderness or deformity  Heart:    Regular rate and rhythm, S1 and S2 normal, no murmur, rub   or gallop  Abdomen:     Soft, non-tender, bowel sounds active all four quadrants,    no masses, no organomegaly.  Abdomen obese  Genitalia:    Normal male without lesion,  discharge or tenderness  Extremities:   Extremities normal, atraumatic, no cyanosis there is trace edema  Pulses:   1+ and symmetric in lower extremities  Skin:   Skin color, texture, turgor normal, no rashes or lesions  Lymph nodes:   Cervical, supraclavicular, and axillary nodes normal  Neurologic:   Normal strength, sensation and reflexes      throughout     ASSESSMENT/PLAN:  1. Chronic diastolic congestive heart failure ([place]) - Ambulatory referral to Cardiology  2. Annual physical exam  3. Pre-diabetes - Ambulatory referral to diabetic education  4. Other emphysema ([place])  5. Essential hypertension 6.  Urinary frequency PSA  Patient Instructions  Need to reduce the sweets and carbohydrates in your diet I recommend a visit with a nutrition counselor  Take the amlodipine once a day - in the morning take the lipitor - atorvastatin once a day in the evening. Take a baby aspirin once a day  Need to follow up with a cardiologist  Need lab testing-Today  I will send you a letter with your test results.  If there is anything of concern, we will call right away.  See me in 3 months   Raylene Everts, MD

## 2017-10-14 NOTE — Patient Instructions (Addendum)
Need to reduce the sweets and carbohydrates in your diet I recommend a visit with a nutrition counselor  Take the amlodipine once a day - in the morning take the lipitor - atorvastatin once a day in the evening. Take a baby aspirin once a day  Need to follow up with a cardiologist  Need lab testing-Today  I will send you a letter with your test results.  If there is anything of concern, we will call right away.  See me in 3 months

## 2017-10-15 LAB — COMPLETE METABOLIC PANEL WITH GFR
AG Ratio: 1.5 (calc) (ref 1.0–2.5)
ALKALINE PHOSPHATASE (APISO): 101 U/L (ref 40–115)
ALT: 11 U/L (ref 9–46)
AST: 14 U/L (ref 10–35)
Albumin: 4.4 g/dL (ref 3.6–5.1)
BUN/Creatinine Ratio: 7 (calc) (ref 6–22)
BUN: 6 mg/dL — AB (ref 7–25)
CO2: 34 mmol/L — AB (ref 20–32)
CREATININE: 0.87 mg/dL (ref 0.70–1.33)
Calcium: 9.2 mg/dL (ref 8.6–10.3)
Chloride: 96 mmol/L — ABNORMAL LOW (ref 98–110)
GFR, Est African American: 113 mL/min/{1.73_m2} (ref >=60)
GFR, Est Non African American: 98 mL/min/{1.73_m2} (ref >=60)
GLUCOSE: 91 mg/dL (ref 65–139)
Globulin: 3 g/dL (calc) (ref 1.9–3.7)
Potassium: 4.6 mmol/L (ref 3.5–5.3)
SODIUM: 135 mmol/L (ref 135–146)
Total Bilirubin: 0.9 mg/dL (ref 0.2–1.2)
Total Protein: 7.4 g/dL (ref 6.1–8.1)

## 2017-10-15 LAB — CBC
HCT: 48 % (ref 38.5–50.0)
Hemoglobin: 16.6 g/dL (ref 13.2–17.1)
MCH: 31.3 pg (ref 27.0–33.0)
MCHC: 34.6 g/dL (ref 32.0–36.0)
MCV: 90.6 fL (ref 80.0–100.0)
MPV: 10.3 fL (ref 7.5–12.5)
Platelets: 237 10*3/uL (ref 140–400)
RBC: 5.3 10*6/uL (ref 4.20–5.80)
RDW: 13.4 % (ref 11.0–15.0)
WBC: 10.1 10*3/uL (ref 3.8–10.8)

## 2017-10-15 LAB — URINALYSIS, ROUTINE W REFLEX MICROSCOPIC
Bilirubin Urine: NEGATIVE
GLUCOSE, UA: NEGATIVE
Hgb urine dipstick: NEGATIVE
Ketones, ur: NEGATIVE
LEUKOCYTES UA: NEGATIVE
NITRITE: NEGATIVE
PH: 8 (ref 5.0–8.0)
PROTEIN: NEGATIVE
Specific Gravity, Urine: 1.01 (ref 1.001–1.03)

## 2017-10-15 LAB — HEMOGLOBIN A1C
HEMOGLOBIN A1C: 5.3 %{Hb} (ref <5.7)
MEAN PLASMA GLUCOSE: 105 (calc)
eAG (mmol/L): 5.8 (calc)

## 2017-10-15 LAB — LIPID PANEL
CHOL/HDL RATIO: 2.3 (calc) (ref <5.0)
CHOLESTEROL: 136 mg/dL (ref <200)
HDL: 60 mg/dL (ref >40)
LDL CHOLESTEROL (CALC): 60 mg/dL
Non-HDL Cholesterol (Calc): 76 mg/dL (calc) (ref <130)
Triglycerides: 82 mg/dL (ref <150)

## 2017-10-15 LAB — HEPATITIS C ANTIBODY
Hepatitis C Ab: NONREACTIVE
SIGNAL TO CUT-OFF: 0.04 (ref <1.00)

## 2017-10-17 ENCOUNTER — Ambulatory Visit: Payer: Medicare Other | Admitting: Cardiovascular Disease

## 2017-10-23 ENCOUNTER — Other Ambulatory Visit: Payer: Self-pay

## 2017-10-23 DIAGNOSIS — J438 Other emphysema: Secondary | ICD-10-CM

## 2017-10-23 MED ORDER — DALIRESP 500 MCG PO TABS: 500.0000 ug | ORAL_TABLET | Freq: Every day | ORAL | 5 refills | Status: DC

## 2017-11-12 ENCOUNTER — Other Ambulatory Visit: Payer: Self-pay | Admitting: Pulmonary Disease

## 2017-11-13 ENCOUNTER — Telehealth: Payer: Self-pay | Admitting: Pulmonary Disease

## 2017-11-13 DIAGNOSIS — G4733 Obstructive sleep apnea (adult) (pediatric): Secondary | ICD-10-CM

## 2017-11-13 NOTE — Telephone Encounter (Signed)
Patient requesting an order for a new face mask to be sent to [facility]. Advised patient I would send in order. Nothing else needed at time of call.[name]

## 2017-11-14 ENCOUNTER — Telehealth: Payer: Self-pay | Admitting: Pulmonary Disease

## 2017-11-14 NOTE — Telephone Encounter (Signed)
Left message for patient

## 2017-11-17 NOTE — Telephone Encounter (Signed)
Call in z pack and prednisone taper starting at 40 mg. Reduce dose by 10 mg every 3 days

## 2017-11-17 NOTE — Telephone Encounter (Signed)
Called pt who stated he has been sneezing a lot and has green mucus when blowing his nose.  Symptoms started Thursday, 11/13/17.  Pt also stated he has some tightness in his chest and is wondering if he could be getting a chest cold.  He has a nasal spray which he states he got from the [facility] store which has not been helping.  Pt is wanting to know if a nasal spray and something else can be prescribed to help with his symptoms.  Dr. Vaughan Browner, please advise on the above. Thanks!

## 2017-11-17 NOTE — Telephone Encounter (Signed)
Pt is calling back  [phone]

## 2017-11-17 NOTE — Telephone Encounter (Signed)
lmtcb X1 for pt.  Will call in rx's after speaking to pt.

## 2017-11-17 NOTE — Telephone Encounter (Signed)
lmtcb x2 for pt. 

## 2017-11-18 ENCOUNTER — Ambulatory Visit: Payer: Medicare Other | Admitting: Nutrition

## 2017-11-18 MED ORDER — PREDNISONE 10 MG PO TABS: ORAL_TABLET | ORAL | 0 refills | Status: DC

## 2017-11-18 MED ORDER — AZITHROMYCIN 250 MG PO TABS: ORAL_TABLET | ORAL | 0 refills | Status: AC

## 2017-11-18 NOTE — Telephone Encounter (Signed)
Attempted to call pt but no answer. Left message for pt to return our call x2 so we can take care of sending the Rx's in to his preferred pharmacy.

## 2017-11-18 NOTE — Telephone Encounter (Signed)
Patient returning call [phone].  Patient states he is at work so sometimes he cannot hear the phone.

## 2017-11-18 NOTE — Telephone Encounter (Signed)
Spoke with pt. He is aware of PM's recommendations. Rxs have been sent in. Nothing further was needed.

## 2017-11-24 ENCOUNTER — Telehealth: Payer: Self-pay | Admitting: Pulmonary Disease

## 2017-11-24 ENCOUNTER — Ambulatory Visit (INDEPENDENT_AMBULATORY_CARE_PROVIDER_SITE_OTHER): Payer: Medicare Other | Admitting: Cardiovascular Disease

## 2017-11-24 ENCOUNTER — Encounter: Payer: Self-pay | Admitting: Cardiovascular Disease

## 2017-11-24 VITALS — BP 124/78 | HR 89 | Ht 67.0 in | Wt 248.0 lb

## 2017-11-24 DIAGNOSIS — J449 Chronic obstructive pulmonary disease, unspecified: Secondary | ICD-10-CM

## 2017-11-24 DIAGNOSIS — Z9989 Dependence on other enabling machines and devices: Secondary | ICD-10-CM

## 2017-11-24 DIAGNOSIS — I50810 Right heart failure, unspecified: Secondary | ICD-10-CM | POA: Diagnosis not present

## 2017-11-24 DIAGNOSIS — I10 Essential (primary) hypertension: Secondary | ICD-10-CM | POA: Diagnosis not present

## 2017-11-24 DIAGNOSIS — I2729 Other secondary pulmonary hypertension: Secondary | ICD-10-CM | POA: Diagnosis not present

## 2017-11-24 DIAGNOSIS — I5032 Chronic diastolic (congestive) heart failure: Secondary | ICD-10-CM | POA: Diagnosis not present

## 2017-11-24 DIAGNOSIS — G4733 Obstructive sleep apnea (adult) (pediatric): Secondary | ICD-10-CM

## 2017-11-24 DIAGNOSIS — F17201 Nicotine dependence, unspecified, in remission: Secondary | ICD-10-CM | POA: Diagnosis not present

## 2017-11-24 NOTE — Telephone Encounter (Signed)
Spoke with patient. He stated that [facility] gave him the wrong mask. Advised patient to get in touch with [facility] since we do not have any control of masks that [facility] chooses.   He verbalized understanding. Nothing else needed.

## 2017-11-24 NOTE — Progress Notes (Signed)
CARDIOLOGY CONSULT NOTE  Patient ID: Ricardo Burch MRN: [id number] DOB/AGE: 1961-09-17 57 y.o.  Admit date: (Not on file) Primary Physician: Raylene Everts, MD Referring Physician: Dr. Meda Coffee  Reason for Consultation: Chronic diastolic heart failure  HPI: Ricardo Burch is a 57 y.o. male who is being seen today for the evaluation of chronic diastolic heart failure at the request of Raylene Everts, MD.   He reportedly has a history of hypertension, severe COPD, tobacco abuse, sleep apnea, and chronic diastolic heart failure.  He was recently started on a Z-Pak and prednisone taper by his pulmonologist.  He was hospitalized in September 2017 for acute on chronic hypercapnic respiratory failure with an acute exacerbation of COPD and acute pulmonary edema.  He apparently had an alcohol and cocaine binge the day prior to presentation.  Echocardiogram which I reviewed performed on 06/28/16 demonstrated normal left ventricular systolic function and regional wall motion, LVEF 18-56%, grade 1 diastolic dysfunction, right ventricular volume and pressure overload, severe right ventricular dilatation with mild to moderately reduced systolic function, severe right atrial dilatation, and moderate to severely elevated pulmonary pressures, 62 mmHg.  I personally reviewed the ECG performed on 06/20/17 which demonstrated sinus rhythm with a right bundle branch block.  He has not had any problems with leg swelling for quite some time.  He uses oxygen as needed.  He has exertional dyspnea when he does something strenuous.  He denies chest pain.  He uses CPAP on a nightly basis.  He is trying to quit smoking and is using nicotine patches.  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  He has no limiting symptoms while at work.  He is very interested in losing weight and reducing his abdominal girth.  He has questions about available exercise programs for patients with COPD and who are  on oxygen.    Allergies  Allergen Reactions  . Apresoline [Hydralazine] Other (See Comments)    Headache   . Chantix [Varenicline] Other (See Comments)    Bad, vivid dreams    Current Outpatient Medications  Medication Sig Dispense Refill  . amLODipine (NORVASC) 5 MG tablet Take 1 tablet (5 mg total) by mouth daily. 90 tablet 3  . aspirin EC 81 MG tablet Take 1 tablet (81 mg total) by mouth daily. 30 tablet 0  . atorvastatin (LIPITOR) 40 MG tablet Take 1 tablet (40 mg total) by mouth daily. 90 tablet 3  . DALIRESP 500 MCG TABS tablet Take 1 tablet (500 mcg total) by mouth daily. 30 tablet 5  . Guaifenesin (MUCINEX MAXIMUM STRENGTH) 1200 MG TB12 Take 1,200 mg by mouth daily.    . OXYGEN Inhale 2 L into the lungs continuous.    . predniSONE (DELTASONE) 10 MG tablet Take 4 tablets for 3 days, 3 tablets for 3 days, 2 tablets for 3 days, 1 tablet for 3 days 30 tablet 0  . VENTOLIN HFA 108 (90 Base) MCG/ACT inhaler Inhale 1-2 puffs into the lungs every 6 (six) hours as needed for wheezing or shortness of breath. (200/12=17) 18 g 1   Current Facility-Administered Medications  Medication Dose Route Frequency Provider Last Rate Last Dose  . umeclidinium bromide (INCRUSE ELLIPTA) 62.5 MCG/INH 1 puff  1 puff Inhalation Daily Magdalen Spatz, NP        Past Medical History:  Diagnosis Date  . Allergy   . CHF (congestive heart failure) ([place])   . CHF (congestive heart failure) ([place])   .  Chronic kidney disease   . COPD (chronic obstructive pulmonary disease) ([place]) Dx 2015  . Depression   . Eczema    since childhood   . Hypertension   . Oxygen deficiency   . Sleep apnea    CPAP  . Substance abuse ([place])    MJ, cocaine    Past Surgical History:  Procedure Laterality Date  . COLONOSCOPY WITH PROPOFOL N/A 06/25/2017   Procedure: COLONOSCOPY WITH PROPOFOL;  Surgeon: Daneil Dolin, MD;  [facility];  Service: Endoscopy;  Laterality: N/A;  2:00pm  . MULTIPLE EXTRACTIONS WITH  ALVEOLOPLASTY N/A 03/22/2016   Procedure: MULTIPLE EXTRACTION WITH ALVEOLOPLASTY;  Surgeon: Diona Browner, DDS;  Location: [facility];  Service: Oral Surgery;  Laterality: N/A;  . NO PAST SURGERIES    . POLYPECTOMY  06/25/2017   Procedure: POLYPECTOMY;  Surgeon: Daneil Dolin, MD;  Location: [facility];  Service: Endoscopy;;  colon    Social History   Socioeconomic History  . Marital status: Single    Spouse name: Not on file  . Number of children: 5  . Years of education: 54  . Highest education level: Not on file  Social Needs  . Financial resource strain: Not on file  . Food insecurity - worry: Not on file  . Food insecurity - inability: Not on file  . Transportation needs - medical: Not on file  . Transportation needs - non-medical: Not on file  Occupational History  . Occupation: Unemployed     Comment: car wash details  Tobacco Use  . Smoking status: Light Tobacco Smoker    Packs/day: 0.25    Years: 30.00    Pack years: 7.50    Types: Cigarettes  . Smokeless tobacco: Never Used  . Tobacco comment: using a patch  Substance and Sexual Activity  . Alcohol use: Yes    Alcohol/week: 0.6 oz    Types: 1 Cans of beer per week    Comment: occassionally: weekends, 6-pack or less on a day; or half pint of liquior  . Drug use: Yes    Types: Marijuana, Cocaine    Comment: not current  . Sexual activity: No  Other Topics Concern  . Not on file  Social History Narrative    Lives alone.    5 daughters 77, 67 yo twins, eldest 2 are married live in [place].    Works part time in a car wash     No family history of premature CAD in 1st degree relatives.  Current Meds  Medication Sig  . amLODipine (NORVASC) 5 MG tablet Take 1 tablet (5 mg total) by mouth daily.  [name] aspirin EC 81 MG tablet Take 1 tablet (81 mg total) by mouth daily.  [name] atorvastatin (LIPITOR) 40 MG tablet Take 1 tablet (40 mg total) by mouth daily.  [name] DALIRESP 500 MCG TABS tablet Take 1 tablet (500 mcg total)  by mouth daily.  . Guaifenesin (MUCINEX MAXIMUM STRENGTH) 1200 MG TB12 Take 1,200 mg by mouth daily.  . OXYGEN Inhale 2 L into the lungs continuous.  . predniSONE (DELTASONE) 10 MG tablet Take 4 tablets for 3 days, 3 tablets for 3 days, 2 tablets for 3 days, 1 tablet for 3 days  . VENTOLIN HFA 108 (90 Base) MCG/ACT inhaler Inhale 1-2 puffs into the lungs every 6 (six) hours as needed for wheezing or shortness of breath. (200/12=17)   Current Facility-Administered Medications for the 11/24/17 encounter (Office Visit) with Herminio Commons, MD  Medication  . umeclidinium bromide (INCRUSE ELLIPTA) 62.5 MCG/INH 1 puff      Review of systems complete and found to be negative unless listed above in HPI    Physical exam Blood pressure 124/78, pulse 89, height 5\' 7"  (1.702 m), weight 248 lb (112.5 kg), SpO2 94 %. General: NAD Neck: No JVD, no thyromegaly or thyroid nodule.  Lungs: Diminished throughout, no crackles or wheezes. CV: Nondisplaced PMI. Regular rate and rhythm, normal S1/S2, no S3/S4, no murmur.  No peripheral edema.  No carotid bruit.    Abdomen: Soft, nontender, protuberant.  Skin: Intact without lesions or rashes.  Neurologic: Alert and oriented x 3.  Psych: Normal affect. Extremities: No clubbing or cyanosis.  HEENT: Normal.   ECG: Most recent ECG reviewed.   Labs: Lab Results  Component Value Date/Time   K 4.6 10/14/2017 11:41 AM   BUN 6 (L) 10/14/2017 11:41 AM   CREATININE 0.87 10/14/2017 11:41 AM   ALT 11 10/14/2017 11:41 AM   TSH 0.668 09/16/2015 04:00 PM   HGB 16.6 10/14/2017 11:41 AM     Lipids: Lab Results  Component Value Date/Time   CHOL 136 10/14/2017 11:41 AM   TRIG 82 10/14/2017 11:41 AM   HDL 60 10/14/2017 11:41 AM        ASSESSMENT AND PLAN:  1.  Chronic diastolic heart failure with severe pulmonary hypertension: Severely elevated pulmonary pressures are likely secondary to severe COPD and obstructive sleep apnea with ongoing tobacco  abuse.  He appears euvolemic.  Focus will be on modifying comorbidities.  He does not require diuretics at this time.  I talked to him about weight loss.  I will make a referral to pulmonary rehab.  2.  Severe COPD: Followed by pulmonary. Currently on Daliresp.  I will make a referral to pulmonary rehab as per his request.  3.  Obstructive sleep apnea: Compliant with CPAP.  4.  Hypertension: Controlled.  No changes to therapy.  5.  Tobacco abuse disorder: Smoking cessation has been advised.  He is trying to quit and is using nicotine patches.  6.  Chronic right heart failure/right ventricular dilatation with systolic dysfunction: This is due to obstructive sleep apnea, obesity, and severe COPD.  He wants to lose weight and we talked about this at length.     Disposition: Follow up in 1 year   Signed: Kate Sable, M.D., F.A.C.C.  11/24/2017, 9:40 AM

## 2017-11-24 NOTE — Patient Instructions (Signed)
Your physician wants you to follow-up in: 1 year with Dr.Koneswaran You will receive a reminder letter in the mail two months in advance. If you don't receive a letter, please call our office to schedule the follow-up appointment.   Your physician recommends that you continue on your current medications as directed. Please refer to the Current Medication list given to you today.   You have been referred to Pulmonary rehab, located at [facility] in the Cardiac rehab dept. They will call you to schedule consultation.    If you need a refill on your cardiac medications before your next appointment, please call your pharmacy.    No lab work or tests ordered today.      Thank you for choosing [facility] !

## 2017-11-27 ENCOUNTER — Ambulatory Visit: Payer: Medicare Other | Admitting: Dietician

## 2017-12-01 ENCOUNTER — Encounter: Payer: Medicare Other | Attending: Family Medicine | Admitting: Nutrition

## 2017-12-01 VITALS — Ht 67.0 in | Wt 251.0 lb

## 2017-12-01 DIAGNOSIS — Z713 Dietary counseling and surveillance: Secondary | ICD-10-CM | POA: Diagnosis not present

## 2017-12-01 DIAGNOSIS — R7303 Prediabetes: Secondary | ICD-10-CM | POA: Diagnosis not present

## 2017-12-01 DIAGNOSIS — E669 Obesity, unspecified: Secondary | ICD-10-CM

## 2017-12-01 NOTE — Progress Notes (Signed)
  Medical Nutrition Therapy:  Appt start time: 1610 end time:  1600.   Assessment:  Primary concerns today: Obesity. H/o Prediabetes. A1C had been above 5.7% but is now 5.3%. LIves by himself. Eats some meals at home and a lot of meals on the road; fast food, junk food, processed foods. Drinks a lot of tea, and koolaid.  This is the most he has weighed. He wants to lose weight  and prevent diabetes.    Lab Results  Component Value Date   HGBA1C 5.3 10/14/2017   Lipid Panel     Component Value Date/Time   CHOL 136 10/14/2017 1141   TRIG 82 10/14/2017 1141   HDL 60 10/14/2017 1141   CHOLHDL 2.3 10/14/2017 1141     CMP Latest Ref Rng & Units 10/14/2017 08/24/2017 06/20/2017  Glucose 65 - 139 mg/dL 91 91 99  BUN 7 - 25 mg/dL 6(L) 11 10  Creatinine 0.70 - 1.33 mg/dL 0.87 0.92 0.91  Sodium 135 - 146 mmol/L 135 136 134(L)  Potassium 3.5 - 5.3 mmol/L 4.6 4.3 3.5  Chloride 98 - 110 mmol/L 96(L) 97(L) 95(L)  CO2 20 - 32 mmol/L 34(H) 30 29  Calcium 8.6 - 10.3 mg/dL 9.2 8.9 8.7(L)  Total Protein 6.1 - 8.1 g/dL 7.4 - -  Total Bilirubin 0.2 - 1.2 mg/dL 0.9 - -  Alkaline Phos 38 - 126 U/L - - -  AST 10 - 35 U/L 14 - -  ALT 9 - 46 U/L 11 - -    Preferred Learning Style:  No preference indicated   Learning Readiness:     Ready  Change in progress   MEDICATIONS:    DIETARY INTAKE:    24-hr recall:  B ( AM): brekfast sandwich; sausage biscuit meal, coffee or sweet tea  Snk ( AM): nabs  L ( PM): mushroom and swiss burger, fries, drink  Snk ( PM)   D ( PM): bologa sandwhich and tator tots, MInute maid juice Snk ( PM): sweet tea or regular punch Beverages: sweet tea or regular punch  Usual physical activity: ADL  Estimated energy needs: 1800  calories 200 g carbohydrates 135 g protein 50 g fat  Progress Towards Goal(s):  In progress.   Nutritional Diagnosis:  NB-1.1 Food and nutrition-related knowledge deficit As related to Obesity.  As evidenced by BMI > 30.     Intervention: Nutrition and Prediabetes education provided on My Plate, CHO counting, meal planning, portion sizes, timing of meals, avoiding snacks between meals  taking medications as prescribed, benefits of exercising 30 minutes per day and prevention of  DM. Stressed need to cut out processed and fast foods, sweets, junk food and focus more on nutrient dense foods. Drink only water.   Goals 1. Follow My Plate 2. Eat three balanced meals per day 3. Cut out snacks and processed foods, junk food 4. Drink only water 5. Increase high fiber foods. 6. Exercise 60 minutes 3-4 times per week Lose 1-2 lbs per week.   Teaching Method Utilized:  Visual Auditory Hands on  Handouts given during visit include:  The Plate Method   Meal Plan Card    Barriers to learning/adherence to lifestyle change: none  Demonstrated degree of understanding via:  Teach Back   Monitoring/Evaluation:  Dietary intake, exercise, meal planning, and body weight in 1 month(s).

## 2017-12-02 ENCOUNTER — Telehealth: Payer: Self-pay | Admitting: Acute Care

## 2017-12-02 NOTE — Telephone Encounter (Signed)
Called pt who stated that his mask broke 2 weeks ago and he ordered a mask but was sent the wrong one.  Pt stated he went last week to order the replacement mask and stated it will take 7-10 days to get a new one.    Pt wanted to let us know this information if the cpap report is off with compliance.  Will route this to Dr. Vaughan Browner as an Juluis Rainier

## 2017-12-02 NOTE — Telephone Encounter (Signed)
Noted. Thanks.

## 2017-12-04 ENCOUNTER — Telehealth: Payer: Self-pay | Admitting: Pulmonary Disease

## 2017-12-04 DIAGNOSIS — Z9989 Dependence on other enabling machines and devices: Principal | ICD-10-CM

## 2017-12-04 DIAGNOSIS — G4733 Obstructive sleep apnea (adult) (pediatric): Secondary | ICD-10-CM

## 2017-12-04 NOTE — Telephone Encounter (Signed)
Called pt letting him know I sent another order to [facility] to obtain the headgear and get a replacement strap.  Pt expressed understanding. Nothing further needed at this current time.

## 2017-12-08 ENCOUNTER — Encounter: Payer: Self-pay | Admitting: Nutrition

## 2017-12-08 NOTE — Patient Instructions (Signed)
Goals 1. Follow My Plate 2. Eat three balanced meals per day 3. Cut out snacks and processed foods, junk food 4. Drink only water 5. Increase high fiber foods. 6. Exercise 60 minutes 3-4 times per week Lose 1-2 lbs per week.

## 2017-12-15 ENCOUNTER — Encounter (HOSPITAL_COMMUNITY): Payer: Medicare Other | Attending: Cardiovascular Disease

## 2017-12-22 ENCOUNTER — Other Ambulatory Visit: Payer: Self-pay | Admitting: Pulmonary Disease

## 2017-12-31 ENCOUNTER — Other Ambulatory Visit: Payer: Self-pay | Admitting: Pulmonary Disease

## 2018-01-05 ENCOUNTER — Encounter: Payer: Medicare Other | Attending: Family Medicine | Admitting: Nutrition

## 2018-01-05 VITALS — Ht 67.0 in | Wt 251.0 lb

## 2018-01-05 DIAGNOSIS — R7303 Prediabetes: Secondary | ICD-10-CM | POA: Insufficient documentation

## 2018-01-05 DIAGNOSIS — E669 Obesity, unspecified: Secondary | ICD-10-CM

## 2018-01-05 DIAGNOSIS — Z713 Dietary counseling and surveillance: Secondary | ICD-10-CM | POA: Insufficient documentation

## 2018-01-05 DIAGNOSIS — R739 Hyperglycemia, unspecified: Secondary | ICD-10-CM

## 2018-01-05 NOTE — Progress Notes (Signed)
Medical Nutrition Therapy:  Appt start time: 1515end time:  0626    Assessment:  Primary concerns today: Obesity. H/o Prediabetes. A1C had been above 5.7% but is now 5.3%.  Changed: He is more meals on time and more whole wheat foods and fresh fruits and vegetables. He is eating out less often. Trying to be more mindful  with meals. Not eating much lunch but willing to make sandwich or take leftovers for lunch. No new A1C yet. Sees PCP in next month or so. He notes he is feeling better and is being more active. Wearing heavier winter clothes today contributing to 3 lbs weight gain. He notes his breathing is better. Diet improving slowing.   Lab Results  Component Value Date   HGBA1C 5.3 10/14/2017   Lipid Panel     Component Value Date/Time   CHOL 136 10/14/2017 1141   TRIG 82 10/14/2017 1141   HDL 60 10/14/2017 1141   CHOLHDL 2.3 10/14/2017 1141     CMP Latest Ref Rng & Units 10/14/2017 08/24/2017 06/20/2017  Glucose 65 - 139 mg/dL 91 91 99  BUN 7 - 25 mg/dL 6(L) 11 10  Creatinine 0.70 - 1.33 mg/dL 0.87 0.92 0.91  Sodium 135 - 146 mmol/L 135 136 134(L)  Potassium 3.5 - 5.3 mmol/L 4.6 4.3 3.5  Chloride 98 - 110 mmol/L 96(L) 97(L) 95(L)  CO2 20 - 32 mmol/L 34(H) 30 29  Calcium 8.6 - 10.3 mg/dL 9.2 8.9 8.7(L)  Total Protein 6.1 - 8.1 g/dL 7.4 - -  Total Bilirubin 0.2 - 1.2 mg/dL 0.9 - -  Alkaline Phos 38 - 126 U/L - - -  AST 10 - 35 U/L 14 - -  ALT 9 - 46 U/L 11 - -    Preferred Learning Style:  No preference indicated   Learning Readiness:     Ready  Change in progress   MEDICATIONS:    DIETARY INTAKE:    24-hr recall:  B ( AM): 2 boiled eggs ans and frosted flakes with non fat milk Snk ( AM): nabs  L ( PM): Nabs and water Snk ( PM)   D ( PM): Baked chicken, frozen vegetables and wheat bread, water Snk ( PM):  Beverages: water    Usual physical activity: Walking some    Estimated energy needs: 1800  calories 200 g carbohydrates 135 g  protein 50 g fat  Progress Towards Goal(s):  In progress.   Nutritional Diagnosis:  NB-1.1 Food and nutrition-related knowledge deficit As related to Obesity.  As evidenced by BMI > 30.    Intervention: Nutrition and Prediabetes education provided on My Plate, CHO counting, meal planning, portion sizes, timing of meals, avoiding snacks between meals  taking medications as prescribed, benefits of exercising 30 minutes per day and prevention of  DM. Stressed need to cut out processed and fast foods, sweets, junk food and focus more on nutrient dense foods. Drink only water.   Goals 1. Increase vegetables 2 servings with lunch and dinner 2. Lose 6-7 lbs  3. Walk 15-30 a few times per week or take steps more often. Plan meals better and use shopping list for things on sale.  Teaching Method Utilized:  Visual Auditory Hands on  Handouts given during visit include:  The Plate Method   Meal Plan Card    Barriers to learning/adherence to lifestyle change: none  Demonstrated degree of understanding via:  Teach Back   Monitoring/Evaluation:  Dietary intake, exercise, meal planning, and body weight  in 1 month(s). May consider adding a GLP1 for assistance in weight loss with his prediabetes.

## 2018-01-05 NOTE — Patient Instructions (Addendum)
Goals 1. Increase vegetables 2 servings with lunch and dinner 2. Lose 6-7 lbs  3. Walk 15-30 a few times per week or take steps more often. Plan meals better and use shopping list for things on sale.

## 2018-01-07 ENCOUNTER — Encounter: Payer: Self-pay | Admitting: Nutrition

## 2018-01-08 ENCOUNTER — Encounter (HOSPITAL_COMMUNITY): Payer: Self-pay

## 2018-01-08 ENCOUNTER — Encounter (HOSPITAL_COMMUNITY)
Admission: RE | Admit: 2018-01-08 | Discharge: 2018-01-08 | Disposition: A | Discharge status: Home / Self Care | Payer: Medicare Other | Source: Ambulatory Visit | Attending: Cardiovascular Disease | Admitting: Cardiovascular Disease

## 2018-01-08 VITALS — BP 124/90 | HR 91 | Ht 67.0 in | Wt 252.4 lb

## 2018-01-08 DIAGNOSIS — Z9989 Dependence on other enabling machines and devices: Secondary | ICD-10-CM | POA: Insufficient documentation

## 2018-01-08 DIAGNOSIS — G4733 Obstructive sleep apnea (adult) (pediatric): Secondary | ICD-10-CM | POA: Diagnosis not present

## 2018-01-08 DIAGNOSIS — J449 Chronic obstructive pulmonary disease, unspecified: Secondary | ICD-10-CM | POA: Insufficient documentation

## 2018-01-08 NOTE — Progress Notes (Signed)
Pulmonary Individual Treatment Plan  Patient Details  Name: Ricardo Burch MRN: [id number] Date of Birth: [birth year] Referring Provider:     PULMONARY REHAB COPD ORIENTATION from 01/08/2018 in [facility]  Referring Provider  DR. Koneswaran      Initial Encounter Date:    PULMONARY REHAB COPD ORIENTATION from 01/08/2018 in [facility]  Date  01/08/18  Referring Provider  DR. Koneswaran      Visit Diagnosis: Chronic obstructive pulmonary disease, unspecified COPD type ([place])  Patient's Home Medications on Admission:   Current Outpatient Medications:  .  amLODipine (NORVASC) 5 MG tablet, Take 1 tablet (5 mg total) by mouth daily., Disp: 90 tablet, Rfl: 3 .  ANORO ELLIPTA 62.5-25 MCG/INH AEPB, Inhale 1 puff daily, Disp: 60 each, Rfl: 2 .  aspirin EC 81 MG tablet, Take 1 tablet (81 mg total) by mouth daily., Disp: 30 tablet, Rfl: 0 .  atorvastatin (LIPITOR) 40 MG tablet, Take 1 tablet (40 mg total) by mouth daily., Disp: 90 tablet, Rfl: 3 .  DALIRESP 500 MCG TABS tablet, Take 1 tablet (500 mcg total) by mouth daily., Disp: 30 tablet, Rfl: 5 .  Guaifenesin (MUCINEX MAXIMUM STRENGTH) 1200 MG TB12, Take 1,200 mg by mouth daily., Disp: , Rfl:  .  OXYGEN, Inhale 2 L into the lungs continuous., Disp: , Rfl:  .  VENTOLIN HFA 108 (90 Base) MCG/ACT inhaler, Inhale 1-2 puffs into the lungs every 6 (six) hours as needed for wheezing or shortness of breath. (200/12=17), Disp: 18 g, Rfl: 2  Past Medical History: Past Medical History:  Diagnosis Date  . Allergy   . CHF (congestive heart failure) ([place])   . CHF (congestive heart failure) ([place])   . Chronic kidney disease   . COPD (chronic obstructive pulmonary disease) ([place]) Dx 2015  . Depression   . Eczema    since childhood   . Hypertension   . Oxygen deficiency   . Sleep apnea    CPAP  . Substance abuse ([place])    MJ, cocaine    Tobacco Use: Social History   Tobacco Use  Smoking Status Light  Tobacco Smoker  . Packs/day: 0.25  . Years: 38.00  . Pack years: 9.50  . Types: Cigarettes  Smokeless Tobacco Never Used  Tobacco Comment   using a patch    Labs: Recent Review Flowsheet Data    Labs for ITP Cardiac and Pulmonary Rehab Latest Ref Rng & Units 09/17/2015 07/26/2016 07/27/2016 07/27/2016 10/14/2017   Cholestrol <200 mg/dL - - - - 136   HDL >40 mg/dL - - - - 60   Trlycerides <150 mg/dL - - - - 82   Hemoglobin A1c <5.7 % of total Hgb - - - - 5.3   PHART 7.350 - 7.450 7.274(L) - 7.184(LL) 7.341(L) -   PCO2ART 32.0 - 48.0 mmHg 81.2(HH) - 0.0(LL) 84.2(HH) -   HCO3 20.0 - 28.0 mmol/L 36.7(H) - 47.0(H) 44.3(H) -   TCO2 0 - 100 mmol/L 32.2 44 51.0 - -   O2SAT % 90.5 - 82.8 92.0 -      Capillary Blood Glucose: Lab Results  Component Value Date   GLUCAP 179 (H) 07/27/2016   GLUCAP 156 (H) 07/27/2016   GLUCAP 140 (H) 09/16/2015     Pulmonary Assessment Scores: Pulmonary Assessment Scores    Row Name 01/08/18 1116         ADL UCSD   ADL Phase  Entry     SOB Score total  31     Rest  0     Walk  3     Stairs  4     Bath  1     Dress  0     Shop  1       CAT Score   CAT Score  23       mMRC Score   mMRC Score  2        Pulmonary Function Assessment: Pulmonary Function Assessment - 01/08/18 1112      Pulmonary Function Tests   FVC%  58 %    FEV1%  45 %    FEV1/FVC Ratio  76    RV%  142 %    DLCO%  56 %      Post Bronchodilator Spirometry Results   FVC%  2.72 %    FEV1%  1.65 %    FEV1/FVC Ratio  61      Breath   Bilateral Breath Sounds  Clear    Shortness of Breath  Yes       Exercise Target Goals: Date: 01/08/18  Exercise Program Goal: Individual exercise prescription set using results from initial 6 min walk test and THRR while considering  patient's activity barriers and safety.   Exercise Prescription Goal: Initial exercise prescription builds to 30-45 minutes a day of aerobic activity, 2-3 days per week.  Home exercise guidelines  will be given to patient during program as part of exercise prescription that the participant will acknowledge.  Activity Barriers & Risk Stratification: Activity Barriers & Cardiac Risk Stratification - 01/08/18 0945      Activity Barriers & Cardiac Risk Stratification   Activity Barriers  Shortness of Breath    Cardiac Risk Stratification  Moderate       6 Minute Walk: 6 Minute Walk    Row Name 01/08/18 0943         6 Minute Walk   Phase  Initial     Distance  1100 feet     Distance % Change  0 %     Distance Feet Change  0 ft     Walk Time  6 minutes     # of Rest Breaks  0     MPH  2.08     METS  2.59     RPE  11     Perceived Dyspnea   14     VO2 Peak  10.63     Symptoms  No     Resting HR  91 bpm     Resting BP  124/90     Resting Oxygen Saturation   92 %     Exercise Oxygen Saturation  during 6 min walk  82 %     Max Ex. HR  112 bpm     Max Ex. BP  154/96     2 Minute Post BP  126/88        Oxygen Initial Assessment: Oxygen Initial Assessment - 01/08/18 1032      Home Oxygen   Home Oxygen Device  E-Tanks;Home Concentrator    Sleep Oxygen Prescription  CPAP    Liters per minute  2    Home Exercise Oxygen Prescription  None    Home at Rest Exercise Oxygen Prescription  Continuous    Liters per minute  2    Compliance with Home Oxygen Use  Yes      Initial 6 min Walk   Oxygen Used  Continuous  Liters per minute  4      Program Oxygen Prescription   Program Oxygen Prescription  Continuous;E-Tanks    Liters per minute  2      Intervention   Short Term Goals  To learn and demonstrate proper pursed lip breathing techniques or other breathing techniques.;To learn and understand importance of maintaining oxygen saturations>88%    Long  Term Goals  Exhibits proper breathing techniques, such as pursed lip breathing or other method taught during program session;Maintenance of O2 saturations>88%       Oxygen Re-Evaluation:   Oxygen Discharge (Final  Oxygen Re-Evaluation):   Initial Exercise Prescription: Initial Exercise Prescription - 01/08/18 0900      Date of Initial Exercise RX and Referring Provider   Date  01/08/18    Referring Provider  DR. Koneswaran      Treadmill   MPH  1.3    Grade  0    Minutes  15    METs  1.9      NuStep   Level  2    SPM  55    Minutes  20    METs  1.8      Prescription Details   Frequency (times per week)  3    Duration  Progress to 30 minutes of continuous aerobic without signs/symptoms of physical distress      Intensity   THRR 40-80% of Max Heartrate  [phone]    Ratings of Perceived Exertion  11-13    Perceived Dyspnea  0-4      Progression   Progression  Continue progressive overload as per policy without signs/symptoms or physical distress.      Resistance Training   Training Prescription  Yes    Weight  1    Reps  10-15       Perform Capillary Blood Glucose checks as needed.  Exercise Prescription Changes:   Exercise Comments:   Exercise Goals and Review:  Exercise Goals    Row Name 01/08/18 0947             Exercise Goals   Increase Physical Activity  Yes       Intervention  Provide advice, education, support and counseling about physical activity/exercise needs.;Develop an individualized exercise prescription for aerobic and resistive training based on initial evaluation findings, risk stratification, comorbidities and participant's personal goals.       Expected Outcomes  Short Term: Attend rehab on a regular basis to increase amount of physical activity.       Increase Strength and Stamina  Yes       Intervention  Provide advice, education, support and counseling about physical activity/exercise needs.;Develop an individualized exercise prescription for aerobic and resistive training based on initial evaluation findings, risk stratification, comorbidities and participant's personal goals.       Expected Outcomes  Short Term: Increase workloads from  initial exercise prescription for resistance, speed, and METs.       Able to understand and use rate of perceived exertion (RPE) scale  Yes       Intervention  Provide education and explanation on how to use RPE scale       Expected Outcomes  Short Term: Able to use RPE daily in rehab to express subjective intensity level;Long Term:  Able to use RPE to guide intensity level when exercising independently       Able to understand and use Dyspnea scale  Yes       Intervention  Provide education  and explanation on how to use Dyspnea scale       Expected Outcomes  Short Term: Able to use Dyspnea scale daily in rehab to express subjective sense of shortness of breath during exertion;Long Term: Able to use Dyspnea scale to guide intensity level when exercising independently       Knowledge and understanding of Target Heart Rate Range (THRR)  Yes       Intervention  Provide education and explanation of THRR including how the numbers were predicted and where they are located for reference       Expected Outcomes  Short Term: Able to state/look up THRR;Long Term: Able to use THRR to govern intensity when exercising independently;Short Term: Able to use daily as guideline for intensity in rehab       Able to check pulse independently  Yes       Intervention  Provide education and demonstration on how to check pulse in carotid and radial arteries.;Review the importance of being able to check your own pulse for safety during independent exercise       Expected Outcomes  Short Term: Able to explain why pulse checking is important during independent exercise;Long Term: Able to check pulse independently and accurately       Understanding of Exercise Prescription  Yes       Intervention  Provide education, explanation, and written materials on patient's individual exercise prescription       Expected Outcomes  Short Term: Able to explain program exercise prescription;Long Term: Able to explain home exercise  prescription to exercise independently          Exercise Goals Re-Evaluation :   Discharge Exercise Prescription (Final Exercise Prescription Changes):   Nutrition:  Target Goals: Understanding of nutrition guidelines, daily intake of sodium 1500mg , cholesterol 200mg , calories 30% from fat and 7% or less from saturated fats, daily to have 5 or more servings of fruits and vegetables.  Biometrics: Pre Biometrics - 01/08/18 0947      Pre Biometrics   Height  5\' 7"  (1.702 m)    Weight  252 lb 6.4 oz (114.5 kg)    Waist Circumference  44.5 inches    Hip Circumference  42.5 inches    Waist to Hip Ratio  1.05 %    BMI (Calculated)  39.52    Triceps Skinfold  10 mm    % Body Fat  32.2 %    Grip Strength  75.06 kg    Flexibility  0 in    Single Leg Stand  3.5 seconds        Nutrition Therapy Plan and Nutrition Goals: Nutrition Therapy & Goals - 01/08/18 1127      Personal Nutrition Goals   Personal Goal #2  Patient is working n eating more heart healthy. He is working with a dietician to lose weight.     Additional Goals?  No      Intervention Plan   Expected Outcomes  Short Term Goal: A plan has been developed with personal nutrition goals set during dietitian appointment.;Long Term Goal: Adherence to prescribed nutrition plan.       Nutrition Assessments: Nutrition Assessments - 01/08/18 1128      MEDFICTS Scores   Pre Score  59       Nutrition Goals Re-Evaluation:   Nutrition Goals Discharge (Final Nutrition Goals Re-Evaluation):   Psychosocial: Target Goals: Acknowledge presence or absence of significant depression and/or stress, maximize coping skills, provide positive support system. Participant is  able to verbalize types and ability to use techniques and skills needed for reducing stress and depression.  Initial Review & Psychosocial Screening: Initial Psych Review & Screening - 01/08/18 1132      Initial Review   Current issues with  None Identified       Family Dynamics   Concerns  No support system      Barriers   Psychosocial barriers to participate in program  There are no identifiable barriers or psychosocial needs.      Screening Interventions   Interventions  Encouraged to exercise    Expected Outcomes  Short Term goal: Identification and review with participant of any Quality of Life or Depression concerns found by scoring the questionnaire.;Long Term goal: The participant improves quality of Life and PHQ9 Scores as seen by post scores and/or verbalization of changes       Quality of Life Scores: Quality of Life - 01/08/18 1002      Quality of Life Scores   Health/Function Pre  9.23 %    Socioeconomic Pre  14.17 %    Psych/Spiritual Pre  18.79 %    Family Pre  13.5 %    GLOBAL Pre  12.8 %      Scores of 19 and below usually indicate a poorer quality of life in these areas.  A difference of  2-3 points is a clinically meaningful difference.  A difference of 2-3 points in the total score of the Quality of Life Index has been associated with significant improvement in overall quality of life, self-image, physical symptoms, and general health in studies assessing change in quality of life.   PHQ-9: Recent Review Flowsheet Data    Depression screen [facility] 2/9 01/08/2018 12/01/2017 09/15/2017 01/02/2017 07/23/2016   Decreased Interest 0 0 0 0 1   Down, Depressed, Hopeless 1 0 0 0 1   PHQ - 2 Score 1 0 0 0 2   Altered sleeping 0 - - 0 1   Tired, decreased energy 1 - - 0 1   Change in appetite 0 - - 0 0   Feeling bad or failure about yourself  0 - - 0 1   Trouble concentrating 0 - - 0 1   Moving slowly or fidgety/restless 0 - - 0 1   Suicidal thoughts 0 - - 0 0   PHQ-9 Score 2 - - 0 7   Difficult doing work/chores Somewhat difficult - - - Somewhat difficult     Interpretation of Total Score  Total Score Depression Severity:  1-4 = Minimal depression, 5-9 = Mild depression, 10-14 = Moderate depression, 15-19 = Moderately  severe depression, 20-27 = Severe depression   Psychosocial Evaluation and Intervention: Psychosocial Evaluation - 01/08/18 1134      Psychosocial Evaluation & Interventions   Interventions  Encouraged to exercise with the program and follow exercise prescription    Comments  Patients QOL scores where low. We discussed his scores and he said his biggest problems is due to SOB he can't do the activities that he used to do and that is what is bothering him.     Continue Psychosocial Services   Follow up required by staff       Psychosocial Re-Evaluation:   Psychosocial Discharge (Final Psychosocial Re-Evaluation):    Education: Education Goals: Education classes will be provided on a weekly basis, covering required topics. Participant will state understanding/return demonstration of topics presented.  Learning Barriers/Preferences: Learning Barriers/Preferences - 01/08/18 1031  Learning Barriers/Preferences   Learning Barriers  None    Learning Preferences  Video;Written Material;Pictoral       Education Topics: How Lungs Work and Diseases: - Discuss the anatomy of the lungs and diseases that can affect the lungs, such as COPD.   Exercise: -Discuss the importance of exercise, FITT principles of exercise, normal and abnormal responses to exercise, and how to exercise safely.   Environmental Irritants: -Discuss types of environmental irritants and how to limit exposure to environmental irritants.   Meds/Inhalers and oxygen: - Discuss respiratory medications, definition of an inhaler and oxygen, and the proper way to use an inhaler and oxygen.   Energy Saving Techniques: - Discuss methods to conserve energy and decrease shortness of breath when performing activities of daily living.    Bronchial Hygiene / Breathing Techniques: - Discuss breathing mechanics, pursed-lip breathing technique,  proper posture, effective ways to clear airways, and other functional  breathing techniques   Cleaning Equipment: - Provides group verbal and written instruction about the health risks of elevated stress, cause of high stress, and healthy ways to reduce stress.   Nutrition I: Fats: - Discuss the types of cholesterol, what cholesterol does to the body, and how cholesterol levels can be controlled.   Nutrition II: Labels: -Discuss the different components of food labels and how to read food labels.   Respiratory Infections: - Discuss the signs and symptoms of respiratory infections, ways to prevent respiratory infections, and the importance of seeking medical treatment when having a respiratory infection.   Stress I: Signs and Symptoms: - Discuss the causes of stress, how stress may lead to anxiety and depression, and ways to limit stress.   Stress II: Relaxation: -Discuss relaxation techniques to limit stress.   Oxygen for Home/Travel: - Discuss how to prepare for travel when on oxygen and proper ways to transport and store oxygen to ensure safety.   Knowledge Questionnaire Score: Knowledge Questionnaire Score - 01/08/18 1110      Knowledge Questionnaire Score   Pre Score  15/18       Core Components/Risk Factors/Patient Goals at Admission: Personal Goals and Risk Factors at Admission - 01/08/18 1128      Core Components/Risk Factors/Patient Goals on Admission    Weight Management  Yes    Intervention  Weight Management/Obesity: Establish reasonable short term and long term weight goals.;Obesity: Provide education and appropriate resources to help participant work on and attain dietary goals.    Admit Weight  252 lb 6.4 oz (114.5 kg)    Goal Weight: Short Term  244 lb 14.4 oz (111.1 kg)    Goal Weight: Long Term  237 lb 6.4 oz (107.7 kg)    Expected Outcomes  Short Term: Continue to assess and modify interventions until short term weight is achieved;Long Term: Adherence to nutrition and physical activity/exercise program aimed toward  attainment of established weight goal    Improve shortness of breath with ADL's  Yes    Intervention  Provide education, individualized exercise plan and daily activity instruction to help decrease symptoms of SOB with activities of daily living.    Expected Outcomes  Short Term: Improve cardiorespiratory fitness to achieve a reduction of symptoms when performing ADLs;Long Term: Be able to perform more ADLs without symptoms or delay the onset of symptoms    Personal Goal Other  Yes    Personal Goal  Breathe better, Lose 15lbs while in program    Intervention  Attend class 2 x week and supplement  with home exercise 3 x week    Expected Outcomes  Reach personal goals       Core Components/Risk Factors/Patient Goals Review:    Core Components/Risk Factors/Patient Goals at Discharge (Final Review):    ITP Comments: ITP Comments    Row Name 01/08/18 1118           ITP Comments  Mr. Vonbargen is a pleasant 57 year old African American male. He has not been in our program before. He is eager to get started. Wants to get stronger so that he can get back to his regular activites.           Comments: Patient arrived for 1st visit/orientation/education at 0800. Patient was referred to PR by Dr. [name] due to COPD (J44.9). During orientation advised patient on arrival and appointment times what to wear, what to do before, during and after exercise. Reviewed attendance and class policy. Talked about inclement weather and class consultation policy. Pt is scheduled to return Pulmonary Rehab on 01/13/18. Pt was advised to come to class 15 minutes before class starts. Patient was also given instructions on meeting with the dietician and attending the Family Structure classes. Discussed RPE/Dpysnea scales. Discussed initial THR and how to find their radial and/or carotid pulse. Discussed the initial exercise prescription and how this effects their progress. Pt is eager to get started. Patient participated  in warm up stretches followed by light weights and resistance bands. Patient was able to complete 6 minute walk test. Patient started walk test at 3L/min oxygen and was increased to 4L/min at 3 minutes 30 seconds due to SOB and low O2 saturation. Patient did not c/o pain. Patient was measured for the equipment. Discussed equipment safety with patient. Took patient pre-anthropometric measurements. Patient finished visit at 10:30.

## 2018-01-08 NOTE — Progress Notes (Signed)
Daily Session Note  Patient Details  Name: Sagan Fasnacht MRN: [id number] Date of Birth: 10/22/1961 Referring Provider:     PULMONARY REHAB COPD ORIENTATION from 01/08/2018 in [facility] CARDIAC REHABILITATION  Referring Provider  DR. Koneswaran      Encounter Date: 01/08/2018  Check In: Session Check In - 01/08/18 0800      Check-In   Location  [facility]    Staff Present  Diane Coad, MS, EP, CHC, Exercise Physiologist;Gregory Cowan, BS, EP, Exercise Physiologist;Debra Johnson, RN, BSN    Supervising physician immediately available to respond to emergencies  See telemetry face sheet for immediately available MD    Medication changes reported      No    Fall or balance concerns reported     No    Tobacco Cessation  Use Decreased Working on quitting, wearing the patch, down to 1 cigarette/day    Warm-up and Cool-down  Performed as group-led instruction    Resistance Training Performed  Yes    VAD Patient?  No      Pain Assessment   Currently in Pain?  No/denies    Pain Score  0-No pain    Multiple Pain Sites  No       Capillary Blood Glucose: No results found for this or any previous visit (from the past 24 hour(s)).    Social History   Tobacco Use  Smoking Status Light Tobacco Smoker  . Packs/day: 0.25  . Years: 38.00  . Pack years: 9.50  . Types: Cigarettes  Smokeless Tobacco Never Used  Tobacco Comment   using a patch    Goals Met:  Independence with exercise equipment Exercise tolerated well No report of cardiac concerns or symptoms Strength training completed today  Goals Unmet:  Not Applicable  Comments: Check out: 10:30   Dr. Edward Hawkins is Medical Director for [facility]. 

## 2018-01-08 NOTE — Progress Notes (Signed)
Cardiac/Pulmonary Rehab Medication Review by a Pharmacist  Does the patient  feel that his/her medications are working for him/her?  yes  Has the patient been experiencing any side effects to the medications prescribed?  no  Does the patient measure his/her own blood pressure or blood glucose at home?  no   Does the patient have any problems obtaining medications due to transportation or finances?   no  Understanding of regimen: good Understanding of indications: good Potential of compliance: good  Questions asked to Determine Patient Understanding of Medication Regimen:  1. What is the name of the medication?  2. What is the medication used for?  3. When should it be taken?  4. How much should be taken?  5. How will you take it?  6. What side effects should you report?  Understanding Defined as: Excellent: All questions above are correct Good: Questions 1-4 are correct Fair: Questions 1-2 are correct  Poor: 1 or none of the above questions are correct   Pharmacist comments: Pt does not c/o any side effects from medication.  Med list updated.  Pt does not check BP at home, states it is typically "OK".    Ricardo Burch A 01/08/2018 8:50 AM

## 2018-01-12 ENCOUNTER — Encounter: Payer: Self-pay | Admitting: Family Medicine

## 2018-01-12 ENCOUNTER — Ambulatory Visit (INDEPENDENT_AMBULATORY_CARE_PROVIDER_SITE_OTHER): Payer: Medicare Other | Admitting: Family Medicine

## 2018-01-12 VITALS — BP 140/82 | HR 107 | Temp 98.1°F | Ht 67.0 in | Wt 250.0 lb

## 2018-01-12 DIAGNOSIS — E785 Hyperlipidemia, unspecified: Secondary | ICD-10-CM | POA: Insufficient documentation

## 2018-01-12 DIAGNOSIS — R7303 Prediabetes: Secondary | ICD-10-CM

## 2018-01-12 DIAGNOSIS — F524 Premature ejaculation: Secondary | ICD-10-CM | POA: Diagnosis not present

## 2018-01-12 DIAGNOSIS — Z720 Tobacco use: Secondary | ICD-10-CM | POA: Diagnosis not present

## 2018-01-12 DIAGNOSIS — I10 Essential (primary) hypertension: Secondary | ICD-10-CM

## 2018-01-12 DIAGNOSIS — J438 Other emphysema: Secondary | ICD-10-CM | POA: Diagnosis not present

## 2018-01-12 HISTORY — DX: Hyperlipidemia, unspecified: E78.5

## 2018-01-12 MED ORDER — SERTRALINE HCL 25 MG PO TABS: 25.0000 mg | ORAL_TABLET | Freq: Every day | ORAL | 2 refills | Status: DC

## 2018-01-12 MED ORDER — VARENICLINE TARTRATE 1 MG PO TABS: 1.0000 mg | ORAL_TABLET | Freq: Every day | ORAL | 1 refills | Status: DC

## 2018-01-12 NOTE — Patient Instructions (Addendum)
STOP THE LIPITOR TAKE THE PRAVASTATIN ONCE A DAY AT NIGHT THIS IS TO REDUCE THE CHOLESTEROL LET ME KNOW IF THIS CAUSES BODY ACHES  TAKE THE SERTRALINE ONCE A DAY THIS WILL CALM YOUR NERVES  YOU ARE ON THE RIGHT TRACK WITH THE DIET AND EXERCISE PLANS  TRY THE CHANTIX ONCE A DAY TAKE IN THE MORNING LET ME KNOW IF IT CAUSES NIGHTMARES  YOUR BLOOD PRESSURE IS GOOD  NEED FOLLOW UP IN SIX MONTHS WE WILL CONTACT YOU ABOUT FOLLOW UP

## 2018-01-12 NOTE — Progress Notes (Signed)
Chief Complaint  Patient presents with  . Follow-up   Patient is here for routine follow-up. He states that he is doing well. He states he is continuing to try to quit smoking but still smokes half pack or more a day.  This is with a patch.  We discussed alternate methods of smoking cessation.  He is unable to quit cold [place].  He has attended classes in the past.  He tried Chantix, and this worked briefly but it caused bad dreams.  He is interested in trying it again.  Now that he has reduced his tobacco intake, he might get by with just 1 pill a day.  If he only takes the morning, not the afternoon, he could reduce his nightmares.  He would like to try this and I did refill a prescription for him. He has a new complaint.  He has premature ejaculation.  He has a new lady friend for about 6 months.  He states the first 2 times they have tried to have sexual relations, "it was over as soon as it started".  He fears that she is disappointed.  We discussed the psychology of overstimulation.  We discussed he can use a lubricant with a desensitizing or numbing action.  There is also medicine that may help.  He agrees to try sertraline 25 mg a day let me know if this helps. He does have a history of drug and alcohol abuse but has quit for many years. He is not getting regular exercise.  Pulmonary medicine has enrolled him in pulmonary rehab.  He is excited to start exercising more.  He went to the diabetes educator and has learned a better diet.  His goal is to quit smoking, diet, exercise, and improve his overall health. He did have a history of prediabetes with a hemoglobin A1c of 6.0.  On his last hemoglobin A1c it did come down to 5.3.  I explained that he is still at risk of diabetes so eating right and exercising his best defense. His blood pressure today is well controlled. He has hyperlipidemia.  His Framingham risk of developing heart disease in the next 10 years is 17%.  Because of this I  started him on atorvastatin 20 mg a day.  Unfortunately, quickly after taking this medication he developed body wide aches and pains.  He remembers now why he stopped it last time.  He is willing to try another statin so pravastatin 10 mg a day is prescribed for him.  He will report to me if he has side effects.  Patient Active Problem List   Diagnosis Date Noted  . Premature ejaculation 01/12/2018  . Hypertension 10/14/2017  . Aortic atherosclerosis ([place]) 09/15/2017  . Pre-diabetes 09/15/2017  . Cocaine abuse ([place]) 07/26/2016  . Depression 07/26/2016  . OSA on CPAP 07/26/2016  . CHF (congestive heart failure) ([place]) 06/27/2016  . Alcohol abuse 06/27/2016  . COPD (chronic obstructive pulmonary disease) ([place]) 08/23/2014  . Eczema 08/23/2014  . Right knee pain 08/23/2014  . Tobacco abuse 03/27/2014    Outpatient Encounter Medications as of 01/12/2018  Medication Sig  . amLODipine (NORVASC) 5 MG tablet Take 1 tablet (5 mg total) by mouth daily.  Jearl Klinefelter ELLIPTA 62.5-25 MCG/INH AEPB Inhale 1 puff daily  . aspirin EC 81 MG tablet Take 1 tablet (81 mg total) by mouth daily.  [name] DALIRESP 500 MCG TABS tablet Take 1 tablet (500 mcg total) by mouth daily.  . Guaifenesin ([place]  STRENGTH) 1200 MG TB12 Take 1,200 mg by mouth daily.  . OXYGEN Inhale 2 L into the lungs continuous.  . VENTOLIN HFA 108 (90 Base) MCG/ACT inhaler Inhale 1-2 puffs into the lungs every 6 (six) hours as needed for wheezing or shortness of breath. (200/12=17)  . sertraline (ZOLOFT) 25 MG tablet Take 1 tablet (25 mg total) by mouth daily.  . varenicline (CHANTIX CONTINUING MONTH PAK) 1 MG tablet Take 1 tablet (1 mg total) by mouth daily.   No facility-administered encounter medications on file as of 01/12/2018.     Allergies  Allergen Reactions  . Apresoline [Hydralazine] Other (See Comments)    Headache     Review of Systems  Constitutional: Positive for unexpected weight change. Negative for activity change  and appetite change.       Gain  HENT: Negative.  Negative for congestion and dental problem.        Dentures  Eyes: Negative for photophobia and visual disturbance.       Regular eye exam, wears contact lens  Respiratory: Positive for cough and shortness of breath. Negative for wheezing.        Stable  Cardiovascular: Negative for chest pain, palpitations and leg swelling.  Gastrointestinal: Negative for abdominal distention, blood in stool, constipation and diarrhea.  Genitourinary: Negative for difficulty urinating and frequency.       Premature ejaculation  Musculoskeletal: Positive for back pain and myalgias. Negative for arthralgias.       Intermittent back pain.  Myalgias on atorvastatin  Skin: Negative for color change and rash.  Neurological: Negative for dizziness and headaches.  Psychiatric/Behavioral: Negative for dysphoric mood. The patient is not nervous/anxious.     BP 140/82 (BP Location: Right Arm, Patient Position: Sitting, Cuff Size: Normal)   Pulse (!) 107   Temp 98.1 F (36.7 C) (Temporal)   Ht 5\' 7"  (1.702 m)   Wt 250 lb (113.4 kg)   SpO2 90%   BMI 39.16 kg/m   Physical Exam  Constitutional: He is oriented to person, place, and time. He appears well-developed and well-nourished.  Obesity, large abdomen.  Pleasant.  Hoarse voice  HENT:  Head: Normocephalic and atraumatic.  Mouth/Throat: Oropharynx is clear and moist.  Eyes: Conjunctivae are normal. Pupils are equal, round, and reactive to light.  Neck: Normal range of motion. Neck supple. No thyromegaly present.  Cardiovascular: Normal rate, regular rhythm and normal heart sounds.  Pulmonary/Chest: Effort normal. No respiratory distress.  Decreased breath sounds  Abdominal: Soft. Bowel sounds are normal.  Rotund  Musculoskeletal: Normal range of motion. He exhibits no edema.  No edema  Lymphadenopathy:    He has no cervical adenopathy.  Neurological: He is alert and oriented to person, place, and  time.  Gait normal  Skin: Skin is warm and dry.  Psychiatric: He has a normal mood and affect. His behavior is normal. Thought content normal.  Nursing note and vitals reviewed.   ASSESSMENT/PLAN:  1. Pre-diabetes Referred for dietary education.  Diet and exercise reviewed.  2. Other emphysema ([place]) Under care of pulmonary medicine.  3. Essential hypertension Controlled.  4. Tobacco abuse We discussed the importance of cessation.  He is going to try another prescription for Chantix.  Prescriptions provided with side effects discussed.  5. Morbid obesity ([place]) Discussed diet and exercise, health benefits of weight reduction.  6. Premature ejaculation Discussed with patient.  Choices offered.  He has googled subject.  Prescription for sertraline.  7.  Hyperlipidemia Intolerant of  atorvastatin Trial of pravastatin, at lower dose Patient Instructions  STOP THE LIPITOR TAKE THE PRAVASTATIN ONCE A DAY AT NIGHT THIS IS TO REDUCE THE CHOLESTEROL LET ME KNOW IF THIS CAUSES BODY ACHES  TAKE THE SERTRALINE ONCE A DAY THIS WILL CALM YOUR NERVES  YOU ARE ON THE RIGHT TRACK WITH THE DIET AND EXERCISE PLANS  TRY THE CHANTIX ONCE A DAY TAKE IN THE MORNING LET ME KNOW IF IT CAUSES NIGHTMARES  YOUR BLOOD PRESSURE IS GOOD  NEED FOLLOW UP IN SIX MONTHS WE WILL CONTACT YOU ABOUT FOLLOW UP   Raylene Everts, MD

## 2018-01-13 ENCOUNTER — Encounter (HOSPITAL_COMMUNITY)
Admission: RE | Admit: 2018-01-13 | Discharge: 2018-01-13 | Disposition: A | Discharge status: Home / Self Care | Payer: Medicare Other | Source: Ambulatory Visit | Attending: Cardiovascular Disease | Admitting: Cardiovascular Disease

## 2018-01-13 DIAGNOSIS — Z9989 Dependence on other enabling machines and devices: Secondary | ICD-10-CM | POA: Diagnosis not present

## 2018-01-13 DIAGNOSIS — J449 Chronic obstructive pulmonary disease, unspecified: Secondary | ICD-10-CM

## 2018-01-13 DIAGNOSIS — G4733 Obstructive sleep apnea (adult) (pediatric): Secondary | ICD-10-CM | POA: Diagnosis not present

## 2018-01-13 NOTE — Progress Notes (Signed)
Daily Session Note  Patient Details  Name: Ricardo Burch MRN: [id number] Date of Birth: 1961-09-04 Referring Provider:     PULMONARY REHAB COPD ORIENTATION from 01/08/2018 in [place]  Referring Provider  DR. [name]      Encounter Date: 01/13/2018  Check In: Session Check In - 01/13/18 1327      Check-In   Location  [facility]    Staff Present  Diane Angelina Pih, MS, EP, [facility], Exercise Physiologist;Elimelech Houseman Luther Parody, BS, EP, Exercise Physiologist    Supervising physician immediately available to respond to emergencies  See telemetry face sheet for immediately available MD    Medication changes reported      No    Fall or balance concerns reported     No    Tobacco Cessation  Use Decreased    Warm-up and Cool-down  Performed as group-led instruction    Resistance Training Performed  Yes    VAD Patient?  No      Pain Assessment   Currently in Pain?  No/denies    Pain Score  0-No pain    Multiple Pain Sites  No       Capillary Blood Glucose: No results found for this or any previous visit (from the past 24 hour(s)).    Social History   Tobacco Use  Smoking Status Light Tobacco Smoker  . Packs/day: 0.25  . Years: 38.00  . Pack years: 9.50  . Types: Cigarettes  Smokeless Tobacco Never Used  Tobacco Comment   using a patch    Goals Met:  Independence with exercise equipment Exercise tolerated well No report of cardiac concerns or symptoms Strength training completed today  Goals Unmet:  Not Applicable  Comments: Check out 230   Dr. Kate Sable is Medical Director for [facility] and Pulmonary Rehab.

## 2018-01-15 ENCOUNTER — Encounter (HOSPITAL_COMMUNITY)
Admission: RE | Admit: 2018-01-15 | Discharge: 2018-01-15 | Disposition: A | Discharge status: Home / Self Care | Payer: Medicare Other | Source: Ambulatory Visit | Attending: Cardiovascular Disease | Admitting: Cardiovascular Disease

## 2018-01-15 DIAGNOSIS — G4733 Obstructive sleep apnea (adult) (pediatric): Secondary | ICD-10-CM | POA: Diagnosis not present

## 2018-01-15 DIAGNOSIS — J449 Chronic obstructive pulmonary disease, unspecified: Secondary | ICD-10-CM

## 2018-01-15 DIAGNOSIS — Z9989 Dependence on other enabling machines and devices: Secondary | ICD-10-CM | POA: Diagnosis not present

## 2018-01-15 NOTE — Progress Notes (Signed)
Daily Session Note  Patient Details  Name: Ricardo Burch MRN: [id number] Date of Birth: 12/15/1960 Referring Provider:     PULMONARY REHAB COPD ORIENTATION from 01/08/2018 in [place] CARDIAC REHABILITATION  Referring Provider  DR. Koneswaran      Encounter Date: 01/15/2018  Check In: Session Check In - 01/15/18 1347      Check-In   Location  [facility]    Staff Present  Diane Coad, MS, EP, [facility], Exercise Physiologist;Amdrew Oboyle, BS, EP, Exercise Physiologist;Debra Johnson, RN, BSN    Supervising physician immediately available to respond to emergencies  See telemetry face sheet for immediately available MD    Medication changes reported      No    Fall or balance concerns reported     No    Tobacco Cessation  Use Decreased    Warm-up and Cool-down  Performed as group-led instruction    Resistance Training Performed  Yes    VAD Patient?  No      Pain Assessment   Currently in Pain?  No/denies    Pain Score  0-No pain    Multiple Pain Sites  No       Capillary Blood Glucose: No results found for this or any previous visit (from the past 24 hour(s)).    Social History   Tobacco Use  Smoking Status Light Tobacco Smoker  . Packs/day: 0.25  . Years: 38.00  . Pack years: 9.50  . Types: Cigarettes  Smokeless Tobacco Never Used  Tobacco Comment   using a patch    Goals Met:  Independence with exercise equipment Improved SOB with ADL's Using PLB without cueing & demonstrates good technique Exercise tolerated well No report of cardiac concerns or symptoms Strength training completed today  Goals Unmet:  Not Applicable  Comments: Check out 230   Dr. Edward Hawkins is Medical Director for [facility]. 

## 2018-01-15 NOTE — Progress Notes (Signed)
Patient has received the take home exercise plan today 01/15/2018. THR was addressed as were safety guidelines for being active when not here in CR. Patient demonstrated an understanding and was encouraged to ask any future questions.

## 2018-01-16 ENCOUNTER — Encounter: Payer: Self-pay | Admitting: Family Medicine

## 2018-01-19 NOTE — Telephone Encounter (Signed)
Called patient regarding message below. No answer, unable to leave message.  

## 2018-01-20 ENCOUNTER — Encounter (HOSPITAL_COMMUNITY)
Admission: RE | Admit: 2018-01-20 | Discharge: 2018-01-20 | Disposition: A | Discharge status: Home / Self Care | Payer: Medicare Other | Source: Ambulatory Visit | Attending: Cardiovascular Disease | Admitting: Cardiovascular Disease

## 2018-01-20 DIAGNOSIS — J449 Chronic obstructive pulmonary disease, unspecified: Secondary | ICD-10-CM

## 2018-01-20 DIAGNOSIS — G4733 Obstructive sleep apnea (adult) (pediatric): Secondary | ICD-10-CM | POA: Diagnosis not present

## 2018-01-20 DIAGNOSIS — Z9989 Dependence on other enabling machines and devices: Secondary | ICD-10-CM | POA: Diagnosis not present

## 2018-01-20 NOTE — Progress Notes (Signed)
Daily Session Note  Patient Details  Name: Ricardo Burch MRN: [id number] Date of Birth: [birth year] Referring Provider:     PULMONARY REHAB COPD ORIENTATION from 01/08/2018 in [place]  Referring Provider  DR. [name]      Encounter Date: 01/20/2018  Check In: Session Check In - 01/20/18 1357      Check-In   Location  [facility]    Staff Present  Diane Angelina Pih, MS, EP, [facility], Exercise Physiologist;Trey Gulbranson Luther Parody, BS, EP, Exercise Physiologist    Supervising physician immediately available to respond to emergencies  See telemetry face sheet for immediately available MD    Medication changes reported      No    Fall or balance concerns reported     No    Tobacco Cessation  No Change    Warm-up and Cool-down  Performed as group-led instruction    Resistance Training Performed  Yes    VAD Patient?  No      Pain Assessment   Currently in Pain?  No/denies    Pain Score  0-No pain    Multiple Pain Sites  No       Capillary Blood Glucose: No results found for this or any previous visit (from the past 24 hour(s)).    Social History   Tobacco Use  Smoking Status Light Tobacco Smoker  . Packs/day: 0.25  . Years: 38.00  . Pack years: 9.50  . Types: Cigarettes  Smokeless Tobacco Never Used  Tobacco Comment   using a patch    Goals Met:  Independence with exercise equipment Improved SOB with ADL's Using PLB without cueing & demonstrates good technique Exercise tolerated well No report of cardiac concerns or symptoms Strength training completed today  Goals Unmet:  Not Applicable  Comments: Check out 230   Dr. Sinda Du is Medical Director for [facility].

## 2018-01-22 ENCOUNTER — Encounter (HOSPITAL_COMMUNITY)
Admission: RE | Admit: 2018-01-22 | Discharge: 2018-01-22 | Disposition: A | Discharge status: Home / Self Care | Payer: Medicare Other | Source: Ambulatory Visit | Attending: Cardiovascular Disease | Admitting: Cardiovascular Disease

## 2018-01-22 DIAGNOSIS — G4733 Obstructive sleep apnea (adult) (pediatric): Secondary | ICD-10-CM | POA: Diagnosis not present

## 2018-01-22 DIAGNOSIS — Z9989 Dependence on other enabling machines and devices: Secondary | ICD-10-CM | POA: Diagnosis not present

## 2018-01-22 DIAGNOSIS — J449 Chronic obstructive pulmonary disease, unspecified: Secondary | ICD-10-CM

## 2018-01-22 NOTE — Progress Notes (Signed)
Daily Session Note  Patient Details  Name: Ricardo Burch MRN: [id number] Date of Birth: Dec 18, 1960 Referring Provider:     PULMONARY REHAB COPD ORIENTATION from 01/08/2018 in [place]  Referring Provider  DR. [name]      Encounter Date: 01/22/2018  Check In: Session Check In - 01/22/18 1340      Check-In   Location  [facility]    Staff Present  Suzanne Boron, BS, EP, Exercise Physiologist;Debra Wynetta Emery, RN, BSN    Supervising physician immediately available to respond to emergencies  See telemetry face sheet for immediately available MD    Medication changes reported      No    Fall or balance concerns reported     No    Tobacco Cessation  No Change    Warm-up and Cool-down  Performed as group-led instruction    Resistance Training Performed  Yes    VAD Patient?  No      Pain Assessment   Currently in Pain?  No/denies    Pain Score  0-No pain    Multiple Pain Sites  No       Capillary Blood Glucose: No results found for this or any previous visit (from the past 24 hour(s)).  Exercise Prescription Changes - 01/22/18 0700      Response to Exercise   Blood Pressure (Admit)  126/72    Blood Pressure (Exercise)  142/90    Blood Pressure (Exit)  126/72    Heart Rate (Admit)  106 bpm    Heart Rate (Exercise)  114 bpm    Heart Rate (Exit)  92 bpm    Oxygen Saturation (Admit)  93 %    Oxygen Saturation (Exercise)  95 %    Oxygen Saturation (Exit)  91 %    Rating of Perceived Exertion (Exercise)  11    Perceived Dyspnea (Exercise)  11    Duration  Progress to 30 minutes of  aerobic without signs/symptoms of physical distress    Intensity  THRR New [phone]      Progression   Progression  Continue to progress workloads to maintain intensity without signs/symptoms of physical distress.      Resistance Training   Training Prescription  Yes    Weight  2    Reps  10-15      Treadmill   MPH  1.4    Grade  0    Minutes  15     METs  2.07      NuStep   Level  2    SPM  80    Minutes  20    METs  1.9      Home Exercise Plan   Plans to continue exercise at  Home (comment)    Frequency  Add 2 additional days to program exercise sessions.    Initial Home Exercises Provided  01/15/18       Social History   Tobacco Use  Smoking Status Light Tobacco Smoker  . Packs/day: 0.25  . Years: 38.00  . Pack years: 9.50  . Types: Cigarettes  Smokeless Tobacco Never Used  Tobacco Comment   using a patch    Goals Met:  Independence with exercise equipment Improved SOB with ADL's Using PLB without cueing & demonstrates good technique Exercise tolerated well No report of cardiac concerns or symptoms Strength training completed today  Goals Unmet:  Not Applicable  Comments: Check out 230   Dr. Sinda Du is Medical  Director for [employer].

## 2018-01-27 ENCOUNTER — Encounter (HOSPITAL_COMMUNITY): Payer: Medicare Other

## 2018-01-29 ENCOUNTER — Encounter (HOSPITAL_COMMUNITY)
Admission: RE | Admit: 2018-01-29 | Discharge: 2018-01-29 | Disposition: A | Discharge status: Home / Self Care | Payer: Medicare Other | Source: Ambulatory Visit | Attending: Cardiovascular Disease | Admitting: Cardiovascular Disease

## 2018-01-29 DIAGNOSIS — J449 Chronic obstructive pulmonary disease, unspecified: Secondary | ICD-10-CM

## 2018-01-29 DIAGNOSIS — Z9989 Dependence on other enabling machines and devices: Secondary | ICD-10-CM | POA: Diagnosis not present

## 2018-01-29 DIAGNOSIS — G4733 Obstructive sleep apnea (adult) (pediatric): Secondary | ICD-10-CM | POA: Diagnosis not present

## 2018-01-29 NOTE — Progress Notes (Signed)
Daily Session Note  Patient Details  Name: Ricardo Burch MRN: [id number] Date of Birth: [birth year] Referring Provider:     PULMONARY REHAB COPD ORIENTATION from 01/08/2018 in [place]  Referring Provider  DR. [name]      Encounter Date: 01/29/2018  Check In: Session Check In - 01/29/18 1351      Check-In   Location  [facility]    Staff Present  Suzanne Boron, BS, EP, Exercise Physiologist;Debra Wynetta Emery, RN, BSN;Diane Coad, MS, EP, [facility], Exercise Physiologist    Supervising physician immediately available to respond to emergencies  See telemetry face sheet for immediately available MD    Medication changes reported      No    Fall or balance concerns reported     No    Tobacco Cessation  No Change    Warm-up and Cool-down  Performed as group-led instruction    Resistance Training Performed  Yes    VAD Patient?  No      Pain Assessment   Currently in Pain?  No/denies    Pain Score  0-No pain    Multiple Pain Sites  No       Capillary Blood Glucose: No results found for this or any previous visit (from the past 24 hour(s)).    Social History   Tobacco Use  Smoking Status Light Tobacco Smoker  . Packs/day: 0.25  . Years: 38.00  . Pack years: 9.50  . Types: Cigarettes  Smokeless Tobacco Never Used  Tobacco Comment   using a patch    Goals Met:  Independence with exercise equipment Improved SOB with ADL's Using PLB without cueing & demonstrates good technique Exercise tolerated well No report of cardiac concerns or symptoms Strength training completed today  Goals Unmet:  Not Applicable  Comments: Check out 230   Dr. Sinda Du is Medical Director for [facility] Pulmonary Re[facility].

## 2018-02-03 ENCOUNTER — Encounter (HOSPITAL_COMMUNITY)
Admission: RE | Admit: 2018-02-03 | Discharge: 2018-02-03 | Disposition: A | Discharge status: Home / Self Care | Payer: Medicare Other | Source: Ambulatory Visit | Attending: Cardiovascular Disease | Admitting: Cardiovascular Disease

## 2018-02-03 DIAGNOSIS — J449 Chronic obstructive pulmonary disease, unspecified: Secondary | ICD-10-CM | POA: Insufficient documentation

## 2018-02-03 DIAGNOSIS — Z9989 Dependence on other enabling machines and devices: Secondary | ICD-10-CM | POA: Insufficient documentation

## 2018-02-03 DIAGNOSIS — G4733 Obstructive sleep apnea (adult) (pediatric): Secondary | ICD-10-CM | POA: Diagnosis not present

## 2018-02-03 NOTE — Progress Notes (Signed)
Daily Session Note  Patient Details  Name: Amaziah Raisanen MRN: [id number] Date of Birth: 11-04-1961 Referring Provider:     PULMONARY REHAB COPD ORIENTATION from 01/08/2018 in [place]  Referring Provider  DR. [name]      Encounter Date: 02/03/2018  Check In: Session Check In - 02/03/18 1330      Check-In   Location  [facility]    Staff Present  Donnielle Addison Angelina Pih, MS, EP, [facility], Exercise Physiologist;Gregory Luther Parody, BS, EP, Exercise Physiologist    Supervising physician immediately available to respond to emergencies  See telemetry face sheet for immediately available MD    Medication changes reported      No    Fall or balance concerns reported     No    Tobacco Cessation  No Change    Warm-up and Cool-down  Performed as group-led instruction    Resistance Training Performed  Yes    VAD Patient?  No      Pain Assessment   Currently in Pain?  No/denies    Pain Score  0-No pain    Multiple Pain Sites  No       Capillary Blood Glucose: No results found for this or any previous visit (from the past 24 hour(s)).    Social History   Tobacco Use  Smoking Status Light Tobacco Smoker  . Packs/day: 0.25  . Years: 38.00  . Pack years: 9.50  . Types: Cigarettes  Smokeless Tobacco Never Used  Tobacco Comment   using a patch    Goals Met:  Proper associated with RPD/PD & O2 Sat Improved SOB with ADL's Exercise tolerated well No report of cardiac concerns or symptoms Strength training completed today  Goals Unmet:  Not Applicable  Comments: Check out: 1430   Dr. Sinda Du is Medical Director for [facility] Pul[facility] Re[facility].

## 2018-02-05 ENCOUNTER — Encounter: Payer: Medicare Other | Attending: Family Medicine | Admitting: Nutrition

## 2018-02-05 ENCOUNTER — Encounter (HOSPITAL_COMMUNITY)
Admission: RE | Admit: 2018-02-05 | Discharge: 2018-02-05 | Disposition: A | Discharge status: Home / Self Care | Payer: Medicare Other | Source: Ambulatory Visit | Attending: Cardiovascular Disease | Admitting: Cardiovascular Disease

## 2018-02-05 VITALS — Ht 67.0 in | Wt 246.0 lb

## 2018-02-05 DIAGNOSIS — G4733 Obstructive sleep apnea (adult) (pediatric): Secondary | ICD-10-CM | POA: Diagnosis not present

## 2018-02-05 DIAGNOSIS — Z6838 Body mass index (BMI) 38.0-38.9, adult: Secondary | ICD-10-CM | POA: Diagnosis not present

## 2018-02-05 DIAGNOSIS — E669 Obesity, unspecified: Secondary | ICD-10-CM

## 2018-02-05 DIAGNOSIS — Z713 Dietary counseling and surveillance: Secondary | ICD-10-CM | POA: Diagnosis not present

## 2018-02-05 DIAGNOSIS — R7303 Prediabetes: Secondary | ICD-10-CM | POA: Diagnosis not present

## 2018-02-05 DIAGNOSIS — Z9989 Dependence on other enabling machines and devices: Secondary | ICD-10-CM | POA: Diagnosis not present

## 2018-02-05 DIAGNOSIS — J449 Chronic obstructive pulmonary disease, unspecified: Secondary | ICD-10-CM

## 2018-02-05 NOTE — Progress Notes (Signed)
Daily Session Note  Patient Details  Name: Ricardo Burch MRN: [id number] Date of Birth: [birth year] Referring Provider:     PULMONARY REHAB COPD ORIENTATION from 01/08/2018 in [place]  Referring Provider  DR. [name]      Encounter Date: 02/05/2018  Check In: Session Check In - 02/05/18 1326      Check-In   Location  [facility]    Staff Present  Diane Angelina Pih, MS, EP, [facility], Exercise Physiologist;Loriann Bosserman Luther Parody, BS, EP, Exercise Physiologist;Debra Wynetta Emery, RN, BSN    Supervising physician immediately available to respond to emergencies  See telemetry face sheet for immediately available MD    Medication changes reported      No    Fall or balance concerns reported     No    Tobacco Cessation  No Change    Warm-up and Cool-down  Performed as group-led instruction    Resistance Training Performed  Yes    VAD Patient?  No      Pain Assessment   Currently in Pain?  No/denies    Pain Score  0-No pain    Multiple Pain Sites  No       Capillary Blood Glucose: No results found for this or any previous visit (from the past 24 hour(s)).    Social History   Tobacco Use  Smoking Status Light Tobacco Smoker  . Packs/day: 0.25  . Years: 38.00  . Pack years: 9.50  . Types: Cigarettes  Smokeless Tobacco Never Used  Tobacco Comment   using a patch    Goals Met:  Independence with exercise equipment Exercise tolerated well No report of cardiac concerns or symptoms Strength training completed today  Goals Unmet:  Not Applicable  Comments: Check out 230   Dr. Sinda Du is Medical Director for [facility] Pulmonary Re[facility].

## 2018-02-05 NOTE — Progress Notes (Signed)
Pulmonary Individual Treatment Plan  Patient Details  Name: Ricardo Burch MRN: [id number] Date of Birth: Sep 02, 1961 Referring Provider:     PULMONARY REHAB COPD ORIENTATION from 01/08/2018 in [place]  Referring Provider  DR. Koneswaran      Initial Encounter Date:    PULMONARY REHAB COPD ORIENTATION from 01/08/2018 in [place]  Date  01/08/18  Referring Provider  DR. Koneswaran      Visit Diagnosis: Chronic obstructive pulmonary disease, unspecified COPD type ([place])  Patient's Home Medications on Admission:   Current Outpatient Medications:  .  amLODipine (NORVASC) 5 MG tablet, Take 1 tablet (5 mg total) by mouth daily., Disp: 90 tablet, Rfl: 3 .  ANORO ELLIPTA 62.5-25 MCG/INH AEPB, Inhale 1 puff daily, Disp: 60 each, Rfl: 2 .  aspirin EC 81 MG tablet, Take 1 tablet (81 mg total) by mouth daily., Disp: 30 tablet, Rfl: 0 .  DALIRESP 500 MCG TABS tablet, Take 1 tablet (500 mcg total) by mouth daily., Disp: 30 tablet, Rfl: 5 .  Guaifenesin (MUCINEX MAXIMUM STRENGTH) 1200 MG TB12, Take 1,200 mg by mouth daily., Disp: , Rfl:  .  OXYGEN, Inhale 2 L into the lungs continuous., Disp: , Rfl:  .  sertraline (ZOLOFT) 25 MG tablet, Take 1 tablet (25 mg total) by mouth daily., Disp: 30 tablet, Rfl: 2 .  varenicline (CHANTIX CONTINUING MONTH PAK) 1 MG tablet, Take 1 tablet (1 mg total) by mouth daily., Disp: 60 tablet, Rfl: 1 .  VENTOLIN HFA 108 (90 Base) MCG/ACT inhaler, Inhale 1-2 puffs into the lungs every 6 (six) hours as needed for wheezing or shortness of breath. (200/12=17), Disp: 18 g, Rfl: 2  Past Medical History: Past Medical History:  Diagnosis Date  . Allergy   . CHF (congestive heart failure) ([place])   . CHF (congestive heart failure) ([place])   . Chronic kidney disease   . COPD (chronic obstructive pulmonary disease) ([place]) Dx 2015  . Depression   . Eczema    since childhood   . Hyperlipidemia 01/12/2018  . Hypertension   . Oxygen  deficiency   . Sleep apnea    CPAP  . Substance abuse ([place])    MJ, cocaine    Tobacco Use: Social History   Tobacco Use  Smoking Status Light Tobacco Smoker  . Packs/day: 0.25  . Years: 38.00  . Pack years: 9.50  . Types: Cigarettes  Smokeless Tobacco Never Used  Tobacco Comment   using a patch    Labs: Recent Review Flowsheet Data    Labs for ITP Cardiac and Pulmonary Rehab Latest Ref Rng & Units 09/17/2015 07/26/2016 07/27/2016 07/27/2016 10/14/2017   Cholestrol <200 mg/dL - - - - 136   LDLCALC mg/dL (calc) - - - - 60   HDL >40 mg/dL - - - - 60   Trlycerides <150 mg/dL - - - - 82   Hemoglobin A1c <5.7 % of total Hgb - - - - 5.3   PHART 7.350 - 7.450 7.274(L) - 7.184(LL) 7.341(L) -   PCO2ART 32.0 - 48.0 mmHg 81.2(HH) - 0.0(LL) 84.2(HH) -   HCO3 20.0 - 28.0 mmol/L 36.7(H) - 47.0(H) 44.3(H) -   TCO2 0 - 100 mmol/L 32.2 44 51.0 - -   O2SAT % 90.5 - 82.8 92.0 -      Capillary Blood Glucose: Lab Results  Component Value Date   GLUCAP 179 (H) 07/27/2016   GLUCAP 156 (H) 07/27/2016   GLUCAP 140 (H) 09/16/2015  Pulmonary Assessment Scores: Pulmonary Assessment Scores    Row Name 01/08/18 1116         ADL UCSD   ADL Phase  Entry     SOB Score total  31     Rest  0     Walk  3     Stairs  4     Bath  1     Dress  0     Shop  1       CAT Score   CAT Score  23       mMRC Score   mMRC Score  2        Pulmonary Function Assessment: Pulmonary Function Assessment - 01/08/18 1112      Pulmonary Function Tests   FVC%  58 %    FEV1%  45 %    FEV1/FVC Ratio  76    RV%  142 %    DLCO%  56 %      Post Bronchodilator Spirometry Results   FVC%  2.72 %    FEV1%  1.65 %    FEV1/FVC Ratio  61      Breath   Bilateral Breath Sounds  Clear    Shortness of Breath  Yes       Exercise Target Goals:    Exercise Program Goal: Individual exercise prescription set using results from initial 6 min walk test and THRR while considering  patient's activity  barriers and safety.   Exercise Prescription Goal: Initial exercise prescription builds to 30-45 minutes a day of aerobic activity, 2-3 days per week.  Home exercise guidelines will be given to patient during program as part of exercise prescription that the participant will acknowledge.  Activity Barriers & Risk Stratification: Activity Barriers & Cardiac Risk Stratification - 01/08/18 0945      Activity Barriers & Cardiac Risk Stratification   Activity Barriers  Shortness of Breath    Cardiac Risk Stratification  Moderate       6 Minute Walk: 6 Minute Walk    Row Name 01/08/18 0943         6 Minute Walk   Phase  Initial     Distance  1100 feet     Distance % Change  0 %     Distance Feet Change  0 ft     Walk Time  6 minutes     # of Rest Breaks  0     MPH  2.08     METS  2.59     RPE  11     Perceived Dyspnea   14     VO2 Peak  10.63     Symptoms  No     Resting HR  91 bpm     Resting BP  124/90     Resting Oxygen Saturation   92 %     Exercise Oxygen Saturation  during 6 min walk  82 %     Max Ex. HR  112 bpm     Max Ex. BP  154/96     2 Minute Post BP  126/88        Oxygen Initial Assessment: Oxygen Initial Assessment - 01/08/18 1032      Home Oxygen   Home Oxygen Device  E-Tanks;Home Concentrator    Sleep Oxygen Prescription  CPAP    Liters per minute  2    Home Exercise Oxygen Prescription  None    Home at Rest Exercise  Oxygen Prescription  Continuous    Liters per minute  2    Compliance with Home Oxygen Use  Yes      Initial 6 min Walk   Oxygen Used  Continuous    Liters per minute  4      Program Oxygen Prescription   Program Oxygen Prescription  Continuous;E-Tanks    Liters per minute  2      Intervention   Short Term Goals  To learn and demonstrate proper pursed lip breathing techniques or other breathing techniques.;To learn and understand importance of maintaining oxygen saturations>88%    Long  Term Goals  Exhibits proper breathing  techniques, such as pursed lip breathing or other method taught during program session;Maintenance of O2 saturations>88%       Oxygen Re-Evaluation: Oxygen Re-Evaluation    Row Name 02/05/18 1405             Program Oxygen Prescription   Program Oxygen Prescription  Continuous;E-Tanks       Liters per minute  2       FiO2%  97         Home Oxygen   Home Oxygen Device  E-Tanks;Home Concentrator       Sleep Oxygen Prescription  CPAP       Liters per minute  2       Home Exercise Oxygen Prescription  None       Home at Rest Exercise Oxygen Prescription  None       Compliance with Home Oxygen Use  Yes         Goals/Expected Outcomes   Short Term Goals  To learn and demonstrate proper pursed lip breathing techniques or other breathing techniques.;To learn and understand importance of maintaining oxygen saturations>88%       Long  Term Goals  Exhibits proper breathing techniques, such as pursed lip breathing or other method taught during program session;Maintenance of O2 saturations>88%          Oxygen Discharge (Final Oxygen Re-Evaluation): Oxygen Re-Evaluation - 02/05/18 1405      Program Oxygen Prescription   Program Oxygen Prescription  Continuous;E-Tanks    Liters per minute  2    FiO2%  97      Home Oxygen   Home Oxygen Device  E-Tanks;Home Concentrator    Sleep Oxygen Prescription  CPAP    Liters per minute  2    Home Exercise Oxygen Prescription  None    Home at Rest Exercise Oxygen Prescription  None    Compliance with Home Oxygen Use  Yes      Goals/Expected Outcomes   Short Term Goals  To learn and demonstrate proper pursed lip breathing techniques or other breathing techniques.;To learn and understand importance of maintaining oxygen saturations>88%    Long  Term Goals  Exhibits proper breathing techniques, such as pursed lip breathing or other method taught during program session;Maintenance of O2 saturations>88%       Initial Exercise  Prescription: Initial Exercise Prescription - 01/08/18 0900      Date of Initial Exercise RX and Referring Provider   Date  01/08/18    Referring Provider  DR. Koneswaran      Treadmill   MPH  1.3    Grade  0    Minutes  15    METs  1.9      NuStep   Level  2    SPM  55    Minutes  20  METs  1.8      Prescription Details   Frequency (times per week)  3    Duration  Progress to 30 minutes of continuous aerobic without signs/symptoms of physical distress      Intensity   THRR 40-80% of Max Heartrate  [phone]    Ratings of Perceived Exertion  11-13    Perceived Dyspnea  0-4      Progression   Progression  Continue progressive overload as per policy without signs/symptoms or physical distress.      Resistance Training   Training Prescription  Yes    Weight  1    Reps  10-15       Perform Capillary Blood Glucose checks as needed.  Exercise Prescription Changes:  Exercise Prescription Changes    Row Name 01/15/18 1400 01/22/18 0700 02/02/18 1400         Response to Exercise   Blood Pressure (Admit)  -  126/72  118/72     Blood Pressure (Exercise)  -  142/90  128/76     Blood Pressure (Exit)  -  126/72  114/66     Heart Rate (Admit)  -  106 bpm  96 bpm     Heart Rate (Exercise)  -  114 bpm  106 bpm     Heart Rate (Exit)  -  92 bpm  95 bpm     Oxygen Saturation (Admit)  -  93 %  92 %     Oxygen Saturation (Exercise)  -  95 %  91 %     Oxygen Saturation (Exit)  -  91 %  90 %     Rating of Perceived Exertion (Exercise)  -  11  11     Perceived Dyspnea (Exercise)  -  11  11     Duration  -  Progress to 30 minutes of  aerobic without signs/symptoms of physical distress  Progress to 30 minutes of  aerobic without signs/symptoms of physical distress     Intensity  -  THRR New 130-142-154  THRR New [phone]       Progression   Progression  -  Continue to progress workloads to maintain intensity without signs/symptoms of physical distress.  Continue to progress  workloads to maintain intensity without signs/symptoms of physical distress.       Resistance Training   Training Prescription  Yes  Yes  Yes     Weight  1  2  2      Reps  10-15  10-15  10-15       Treadmill   MPH  1.3  1.4  1.4     Grade  0  0  0     Minutes  15  15  15      METs  1.9  2.07  2.07       NuStep   Level  2  2  2      SPM  55  80  87     Minutes  20  20  20      METs  1.8  1.9  1.9       Home Exercise Plan   Plans to continue exercise at  Home (comment)  Home (comment)  Home (comment)     Frequency  Add 2 additional days to program exercise sessions.  Add 2 additional days to program exercise sessions.  Add 2 additional days to program exercise sessions.     Initial Home Exercises Provided  01/15/18  01/15/18  01/15/18        Exercise Comments:  Exercise Comments    Row Name 01/15/18 1408 01/22/18 0758 02/02/18 1413       Exercise Comments  Patient has received the take home exercise plan today 01/15/2018. THR was addressed as were safety guidelines for being active when not here in CR. Patient demonstrated an understanding and was encouraged to ask any future questions.   Patient is doing well in PR for just starting. Patient has increased his SPMs on the nustep and has increased his speed on th etreadmill to 1.4. Patients progression will continued to be monitored throughout the remainder of the program.   Patient continues to do well in PR and has maintained his level on both the treadmill as well as thet nustep machine. Patient has also increased his SPMs on the nustep machine. Patient has still only just started the program and will be progressed more in time.         Exercise Goals and Review:  Exercise Goals    Row Name 01/08/18 0947             Exercise Goals   Increase Physical Activity  Yes       Intervention  Provide advice, education, support and counseling about physical activity/exercise needs.;Develop an individualized exercise prescription for  aerobic and resistive training based on initial evaluation findings, risk stratification, comorbidities and participant's personal goals.       Expected Outcomes  Short Term: Attend rehab on a regular basis to increase amount of physical activity.       Increase Strength and Stamina  Yes       Intervention  Provide advice, education, support and counseling about physical activity/exercise needs.;Develop an individualized exercise prescription for aerobic and resistive training based on initial evaluation findings, risk stratification, comorbidities and participant's personal goals.       Expected Outcomes  Short Term: Increase workloads from initial exercise prescription for resistance, speed, and METs.       Able to understand and use rate of perceived exertion (RPE) scale  Yes       Intervention  Provide education and explanation on how to use RPE scale       Expected Outcomes  Short Term: Able to use RPE daily in rehab to express subjective intensity level;Long Term:  Able to use RPE to guide intensity level when exercising independently       Able to understand and use Dyspnea scale  Yes       Intervention  Provide education and explanation on how to use Dyspnea scale       Expected Outcomes  Short Term: Able to use Dyspnea scale daily in rehab to express subjective sense of shortness of breath during exertion;Long Term: Able to use Dyspnea scale to guide intensity level when exercising independently       Knowledge and understanding of Target Heart Rate Range (THRR)  Yes       Intervention  Provide education and explanation of THRR including how the numbers were predicted and where they are located for reference       Expected Outcomes  Short Term: Able to state/look up THRR;Long Term: Able to use THRR to govern intensity when exercising independently;Short Term: Able to use daily as guideline for intensity in rehab       Able to check pulse independently  Yes       Intervention  Provide  education and demonstration  on how to check pulse in carotid and radial arteries.;Review the importance of being able to check your own pulse for safety during independent exercise       Expected Outcomes  Short Term: Able to explain why pulse checking is important during independent exercise;Long Term: Able to check pulse independently and accurately       Understanding of Exercise Prescription  Yes       Intervention  Provide education, explanation, and written materials on patient's individual exercise prescription       Expected Outcomes  Short Term: Able to explain program exercise prescription;Long Term: Able to explain home exercise prescription to exercise independently          Exercise Goals Re-Evaluation : Exercise Goals Re-Evaluation    Row Name 02/02/18 1412             Exercise Goal Re-Evaluation   Exercise Goals Review  Increase Physical Activity;Increase Strength and Stamina;Able to understand and use rate of perceived exertion (RPE) scale;Able to check pulse independently;Knowledge and understanding of Target Heart Rate Range (THRR);Able to understand and use Dyspnea scale;Understanding of Exercise Prescription       Comments  Patient continues to do well in PR and has maintained his level on both the treadmill as well as thet nustep machine. Patient has also increased his SPMs on the nustep machine. Patient has still only just started the program and will be progressed more in time.        Expected Outcomes  Patient wishes to breathe better and to lose weight.           Discharge Exercise Prescription (Final Exercise Prescription Changes): Exercise Prescription Changes - 02/02/18 1400      Response to Exercise   Blood Pressure (Admit)  118/72    Blood Pressure (Exercise)  128/76    Blood Pressure (Exit)  114/66    Heart Rate (Admit)  96 bpm    Heart Rate (Exercise)  106 bpm    Heart Rate (Exit)  95 bpm    Oxygen Saturation (Admit)  92 %    Oxygen Saturation  (Exercise)  91 %    Oxygen Saturation (Exit)  90 %    Rating of Perceived Exertion (Exercise)  11    Perceived Dyspnea (Exercise)  11    Duration  Progress to 30 minutes of  aerobic without signs/symptoms of physical distress    Intensity  THRR New [phone]      Progression   Progression  Continue to progress workloads to maintain intensity without signs/symptoms of physical distress.      Resistance Training   Training Prescription  Yes    Weight  2    Reps  10-15      Treadmill   MPH  1.4    Grade  0    Minutes  15    METs  2.07      NuStep   Level  2    SPM  87    Minutes  20    METs  1.9      Home Exercise Plan   Plans to continue exercise at  Home (comment)    Frequency  Add 2 additional days to program exercise sessions.    Initial Home Exercises Provided  01/15/18       Nutrition:  Target Goals: Understanding of nutrition guidelines, daily intake of sodium 1500mg , cholesterol 200mg , calories 30% from fat and 7% or less from saturated fats, daily to have  5 or more servings of fruits and vegetables.  Biometrics: Pre Biometrics - 01/08/18 0947      Pre Biometrics   Height  5\' 7"  (1.702 m)    Weight  252 lb 6.4 oz (114.5 kg)    Waist Circumference  44.5 inches    Hip Circumference  42.5 inches    Waist to Hip Ratio  1.05 %    BMI (Calculated)  39.52    Triceps Skinfold  10 mm    % Body Fat  32.2 %    Grip Strength  75.06 kg    Flexibility  0 in    Single Leg Stand  3.5 seconds        Nutrition Therapy Plan and Nutrition Goals: Nutrition Therapy & Goals - 02/05/18 1408      Nutrition Therapy   RD appointment deferred  Yes      Personal Nutrition Goals   Personal Goal #2  Patient continues to work on eating more heart healthy. He is working with a dietician to lose weight.     Additional Goals?  No      Intervention Plan   Intervention  Nutrition handout(s) given to patient.    Expected Outcomes  Short Term Goal: A plan has been developed with  personal nutrition goals set during dietitian appointment.;Long Term Goal: Adherence to prescribed nutrition plan.       Nutrition Assessments: Nutrition Assessments - 01/08/18 1128      MEDFICTS Scores   Pre Score  59       Nutrition Goals Re-Evaluation:   Nutrition Goals Discharge (Final Nutrition Goals Re-Evaluation):   Psychosocial: Target Goals: Acknowledge presence or absence of significant depression and/or stress, maximize coping skills, provide positive support system. Participant is able to verbalize types and ability to use techniques and skills needed for reducing stress and depression.  Initial Review & Psychosocial Screening: Initial Psych Review & Screening - 01/08/18 1132      Initial Review   Current issues with  None Identified      Family Dynamics   Concerns  No support system      Barriers   Psychosocial barriers to participate in program  There are no identifiable barriers or psychosocial needs.      Screening Interventions   Interventions  Encouraged to exercise    Expected Outcomes  Short Term goal: Identification and review with participant of any Quality of Life or Depression concerns found by scoring the questionnaire.;Long Term goal: The participant improves quality of Life and PHQ9 Scores as seen by post scores and/or verbalization of changes       Quality of Life Scores: Quality of Life - 01/08/18 1002      Quality of Life Scores   Health/Function Pre  9.23 %    Socioeconomic Pre  14.17 %    Psych/Spiritual Pre  18.79 %    Family Pre  13.5 %    GLOBAL Pre  12.8 %      Scores of 19 and below usually indicate a poorer quality of life in these areas.  A difference of  2-3 points is a clinically meaningful difference.  A difference of 2-3 points in the total score of the Quality of Life Index has been associated with significant improvement in overall quality of life, self-image, physical symptoms, and general health in studies assessing  change in quality of life.   PHQ-9: Recent Review Flowsheet Data    Depression screen [facility] 2/9 01/08/2018 12/01/2017  09/15/2017 01/02/2017 07/23/2016   Decreased Interest 0 0 0 0 1   Down, Depressed, Hopeless 1 0 0 0 1   PHQ - 2 Score 1 0 0 0 2   Altered sleeping 0 - - 0 1   Tired, decreased energy 1 - - 0 1   Change in appetite 0 - - 0 0   Feeling bad or failure about yourself  0 - - 0 1   Trouble concentrating 0 - - 0 1   Moving slowly or fidgety/restless 0 - - 0 1   Suicidal thoughts 0 - - 0 0   PHQ-9 Score 2 - - 0 7   Difficult doing work/chores Somewhat difficult - - - Somewhat difficult     Interpretation of Total Score  Total Score Depression Severity:  1-4 = Minimal depression, 5-9 = Mild depression, 10-14 = Moderate depression, 15-19 = Moderately severe depression, 20-27 = Severe depression   Psychosocial Evaluation and Intervention: Psychosocial Evaluation - 01/08/18 1134      Psychosocial Evaluation & Interventions   Interventions  Encouraged to exercise with the program and follow exercise prescription    Comments  Patients QOL scores where low. We discussed his scores and he said his biggest problems is due to SOB he can't do the activities that he used to do and that is what is bothering him.     Continue Psychosocial Services   Follow up required by staff       Psychosocial Re-Evaluation: Psychosocial Re-Evaluation    [place] Name 02/05/18 1423             Psychosocial Re-Evaluation   Current issues with  None Identified       Comments  Patient's initial QOL was 12.80 and his PHQ-9 score was 2 indicating depression. He says he is not depressed. He scored losest on health/function and family. He does not have a good family support system.        Expected Outcomes  Patient's QOL and PHQ-9 score will improve at discharge and he will have no additional psychosocial issues identified.        Interventions  Stress management education;Relaxation education;Encouraged to  attend Pulmonary Rehabilitation for the exercise       Continue Psychosocial Services   No Follow up required          Psychosocial Discharge (Final Psychosocial Re-Evaluation): Psychosocial Re-Evaluation - 02/05/18 1423      Psychosocial Re-Evaluation   Current issues with  None Identified    Comments  Patient's initial QOL was 12.80 and his PHQ-9 score was 2 indicating depression. He says he is not depressed. He scored losest on health/function and family. He does not have a good family support system.     Expected Outcomes  Patient's QOL and PHQ-9 score will improve at discharge and he will have no additional psychosocial issues identified.     Interventions  Stress management education;Relaxation education;Encouraged to attend Pulmonary Rehabilitation for the exercise    Continue Psychosocial Services   No Follow up required        Education: Education Goals: Education classes will be provided on a weekly basis, covering required topics. Participant will state understanding/return demonstration of topics presented.  Learning Barriers/Preferences: Learning Barriers/Preferences - 01/08/18 1031      Learning Barriers/Preferences   Learning Barriers  None    Learning Preferences  Video;Written Material;Pictoral       Education Topics: How Lungs Work and Diseases: - Discuss the anatomy  of the lungs and diseases that can affect the lungs, such as COPD.   Exercise: -Discuss the importance of exercise, FITT principles of exercise, normal and abnormal responses to exercise, and how to exercise safely.   Environmental Irritants: -Discuss types of environmental irritants and how to limit exposure to environmental irritants.   Meds/Inhalers and oxygen: - Discuss respiratory medications, definition of an inhaler and oxygen, and the proper way to use an inhaler and oxygen.   Energy Saving Techniques: - Discuss methods to conserve energy and decrease shortness of breath when  performing activities of daily living.    Bronchial Hygiene / Breathing Techniques: - Discuss breathing mechanics, pursed-lip breathing technique,  proper posture, effective ways to clear airways, and other functional breathing techniques   Cleaning Equipment: - Provides group verbal and written instruction about the health risks of elevated stress, cause of high stress, and healthy ways to reduce stress.   PULMONARY REHAB CHRONIC OBSTRUCTIVE PULMONARY DISEASE from 01/29/2018 in [place]  Date  01/15/18  Educator  [place]  Instruction Review Code  2- Demonstrated Understanding      Nutrition I: Fats: - Discuss the types of cholesterol, what cholesterol does to the body, and how cholesterol levels can be controlled.   PULMONARY REHAB CHRONIC OBSTRUCTIVE PULMONARY DISEASE from 01/29/2018 in [place]  Date  01/22/18  Educator  [place]  Instruction Review Code  2- Demonstrated Understanding      Nutrition II: Labels: -Discuss the different components of food labels and how to read food labels.   PULMONARY REHAB CHRONIC OBSTRUCTIVE PULMONARY DISEASE from 01/29/2018 in [place]  Date  01/29/18  Educator  [place]  Instruction Review Code  2- Demonstrated Understanding      Respiratory Infections: - Discuss the signs and symptoms of respiratory infections, ways to prevent respiratory infections, and the importance of seeking medical treatment when having a respiratory infection.   Stress I: Signs and Symptoms: - Discuss the causes of stress, how stress may lead to anxiety and depression, and ways to limit stress.   Stress II: Relaxation: -Discuss relaxation techniques to limit stress.   Oxygen for Home/Travel: - Discuss how to prepare for travel when on oxygen and proper ways to transport and store oxygen to ensure safety.   Knowledge Questionnaire Score: Knowledge Questionnaire Score - 01/08/18 1110      Knowledge  Questionnaire Score   Pre Score  15/18       Core Components/Risk Factors/Patient Goals at Admission: Personal Goals and Risk Factors at Admission - 01/08/18 1128      Core Components/Risk Factors/Patient Goals on Admission    Weight Management  Yes    Intervention  Weight Management/Obesity: Establish reasonable short term and long term weight goals.;Obesity: Provide education and appropriate resources to help participant work on and attain dietary goals.    Admit Weight  252 lb 6.4 oz (114.5 kg)    Goal Weight: Short Term  244 lb 14.4 oz (111.1 kg)    Goal Weight: Long Term  237 lb 6.4 oz (107.7 kg)    Expected Outcomes  Short Term: Continue to assess and modify interventions until short term weight is achieved;Long Term: Adherence to nutrition and physical activity/exercise program aimed toward attainment of established weight goal    Improve shortness of breath with ADL's  Yes    Intervention  Provide education, individualized exercise plan and daily activity instruction to help decrease symptoms of SOB with activities of daily  living.    Expected Outcomes  Short Term: Improve cardiorespiratory fitness to achieve a reduction of symptoms when performing ADLs;Long Term: Be able to perform more ADLs without symptoms or delay the onset of symptoms    Personal Goal Other  Yes    Personal Goal  Breathe better, Lose 15lbs while in program    Intervention  Attend class 2 x week and supplement with home exercise 3 x week    Expected Outcomes  Reach personal goals       Core Components/Risk Factors/Patient Goals Review:  Goals and Risk Factor Review    Row Name 02/05/18 1408             Core Components/Risk Factors/Patient Goals Review   Personal Goals Review  Weight Management/Obesity;Improve shortness of breath with ADL's;Diabetes Breathe better; lose weight 15 lbs; do regular ADL's.        Review  Patient has completed 7 sessions losing 6.1 lbs. He is doing well in the program with  some progression. He says it is too soon to tell a difference yet in the program but hopes as he continues the program he will meet his personal goals.        Expected Outcomes  Patient will continue to attend sessions and complete the program and meet his personal goals.           Core Components/Risk Factors/Patient Goals at Discharge (Final Review):  Goals and Risk Factor Review - 02/05/18 1408      Core Components/Risk Factors/Patient Goals Review   Personal Goals Review  Weight Management/Obesity;Improve shortness of breath with ADL's;Diabetes Breathe better; lose weight 15 lbs; do regular ADL's.     Review  Patient has completed 7 sessions losing 6.1 lbs. He is doing well in the program with some progression. He says it is too soon to tell a difference yet in the program but hopes as he continues the program he will meet his personal goals.     Expected Outcomes  Patient will continue to attend sessions and complete the program and meet his personal goals.        ITP Comments: ITP Comments    Row Name 01/08/18 1118           ITP Comments  Mr. Hoogendoorn is a pleasant 57 year old African American male. He has not been in our program before. He is eager to get started. Wants to get stronger so that he can get back to his regular activites.           Comments: ITP 30 Day REVIEW Pt is making expected progress toward pulmonary rehab goals after completing 7 sessions. Recommend continued exercise, life style modification, education, and utilization of breathing techniques to increase stamina and strength and decrease shortness of breath with exertion.

## 2018-02-05 NOTE — Patient Instructions (Signed)
Goals 1. Lose 4-5 lbs per month. 2. Increase fresh fruits and vegetables  With meals. 3. Keep drinking water. PIck up Chantix at pharmacy and change to [facility] if you want.

## 2018-02-05 NOTE — Progress Notes (Signed)
  Medical Nutrition Therapy:  Appt start time: 1515end time:  6568    Assessment:  Primary concerns today: Obesity. H/o Prediabetes. A1C had been above 5.7% but is now 5.3%.   Lost 6 lbs. Has been watching what he is eating. Eating out less and less processed food. Is less hungry.   Still doing Pulmonary Rehab.  Diet improving slowing.   Lab Results  Component Value Date   HGBA1C 5.3 10/14/2017   Lipid Panel     Component Value Date/Time   CHOL 136 10/14/2017 1141   TRIG 82 10/14/2017 1141   HDL 60 10/14/2017 1141   CHOLHDL 2.3 10/14/2017 1141   LDLCALC 60 10/14/2017 1141     CMP Latest Ref Rng & Units 10/14/2017 08/24/2017 06/20/2017  Glucose 65 - 139 mg/dL 91 91 99  BUN 7 - 25 mg/dL 6(L) 11 10  Creatinine 0.70 - 1.33 mg/dL 0.87 0.92 0.91  Sodium 135 - 146 mmol/L 135 136 134(L)  Potassium 3.5 - 5.3 mmol/L 4.6 4.3 3.5  Chloride 98 - 110 mmol/L 96(L) 97(L) 95(L)  CO2 20 - 32 mmol/L 34(H) 30 29  Calcium 8.6 - 10.3 mg/dL 9.2 8.9 8.7(L)  Total Protein 6.1 - 8.1 g/dL 7.4 - -  Total Bilirubin 0.2 - 1.2 mg/dL 0.9 - -  Alkaline Phos 38 - 126 U/L - - -  AST 10 - 35 U/L 14 - -  ALT 9 - 46 U/L 11 - -    Preferred Learning Style:  No preference indicated   Learning Readiness:     Ready  Change in progress   MEDICATIONS:    DIETARY INTAKE:    24-hr recall:  B ( AM): Raisin bran and 2% milk milk; eggs and ww toast and fruit, Snk ( AM):  L ( PM):  Leftover or sanwich,  And vegetables.  Snk ( PM)   D ( PM): Baked chicken, frozen vegetables and wheat bread, water Snk ( PM):  Beverages: water    Usual physical activity: Walking some    Estimated energy needs: 1800  calories 200 g carbohydrates 135 g protein 50 g fat  Progress Towards Goal(s):  In progress.   Nutritional Diagnosis:  NB-1.1 Food and nutrition-related knowledge deficit As related to Obesity.  As evidenced by BMI > 30.    Intervention: Nutrition and Prediabetes education provided on My Plate,  CHO counting, meal planning, portion sizes, timing of meals, avoiding snacks between meals  taking medications as prescribed, benefits of exercising 30 minutes per day and prevention of  DM. Stressed need to cut out processed and fast foods, sweets, junk food and focus more on nutrient dense foods. Drink only water.   Goals 1. Lose 4-5 lbs per month. 2. Increase fresh fruits and vegetables  With meals. 3. Keep drinking water. PIck up Chantix at pharmacy and change to [facility] if you wan  Teaching Method Utilized:  Visual Auditory Hands on  Handouts given during visit include:  The Plate Method   Meal Plan Card    Barriers to learning/adherence to lifestyle change: none  Demonstrated degree of understanding via:  Teach Back   Monitoring/Evaluation:  Dietary intake, exercise, meal planning, and body weight in 1 month(s). May consider adding a GLP1 for assistance in weight loss with his prediabetes.

## 2018-02-06 ENCOUNTER — Telehealth: Payer: Self-pay | Admitting: Pulmonary Disease

## 2018-02-06 MED ORDER — AZITHROMYCIN 250 MG PO TABS: 250.0000 mg | ORAL_TABLET | Freq: Every day | ORAL | 0 refills | Status: DC

## 2018-02-06 NOTE — Telephone Encounter (Signed)
Spoke with pt. States that he is not feeling well. Reports body aches, nasal congestion, sore throat and cough. Cough is producing green mucus. Denies chest tightness, wheezing, SOB or fever. Has been taking Mucinex with minimal relief. Pt would like to have something sent in.  Dr. Vaughan Browner - please advise. Thanks.

## 2018-02-06 NOTE — Telephone Encounter (Signed)
Patient returned call, CB is [phone].

## 2018-02-06 NOTE — Telephone Encounter (Signed)
Call in z pack please. 

## 2018-02-06 NOTE — Telephone Encounter (Signed)
Pt is calling back [phone]

## 2018-02-06 NOTE — Telephone Encounter (Signed)
Called pt letting him know Dr. Vaughan Browner wanted [place] to send in zpak for him.  Pt expressed understanding.  Script sent in. Nothing further needed at this time.

## 2018-02-06 NOTE — Telephone Encounter (Signed)
Attempted to call patient, no answer, left message to call back.  

## 2018-02-06 NOTE — Telephone Encounter (Signed)
Attempted to call patient, no answer, left message for patient to call back.  

## 2018-02-10 ENCOUNTER — Encounter (HOSPITAL_COMMUNITY): Payer: Medicare Other

## 2018-02-10 ENCOUNTER — Telehealth: Payer: Self-pay | Admitting: Family Medicine

## 2018-02-10 NOTE — Telephone Encounter (Signed)
Please call [employer] - about his CHANTIX - He said he thinks the Doctor needs to release it.

## 2018-02-10 NOTE — Telephone Encounter (Signed)
Left message on cell phone (DPR) letting patient know his Chantix will require a Prior Auth before it can be filled. If he has any questions he can call the office.

## 2018-02-12 ENCOUNTER — Encounter: Payer: Self-pay | Admitting: Nutrition

## 2018-02-12 ENCOUNTER — Encounter (HOSPITAL_COMMUNITY): Payer: Medicare Other

## 2018-02-17 ENCOUNTER — Encounter (HOSPITAL_COMMUNITY)
Admission: RE | Admit: 2018-02-17 | Discharge: 2018-02-17 | Disposition: A | Discharge status: Home / Self Care | Payer: Medicare Other | Source: Ambulatory Visit | Attending: Cardiovascular Disease | Admitting: Cardiovascular Disease

## 2018-02-17 DIAGNOSIS — G4733 Obstructive sleep apnea (adult) (pediatric): Secondary | ICD-10-CM | POA: Diagnosis not present

## 2018-02-17 DIAGNOSIS — Z9989 Dependence on other enabling machines and devices: Secondary | ICD-10-CM | POA: Diagnosis not present

## 2018-02-17 DIAGNOSIS — J449 Chronic obstructive pulmonary disease, unspecified: Secondary | ICD-10-CM

## 2018-02-17 NOTE — Progress Notes (Signed)
Daily Session Note  Patient Details  Name: Ricardo Burch MRN: [id number] Date of Birth: [birth year] Referring Provider:     PULMONARY REHAB COPD ORIENTATION from 01/08/2018 in [facility]  Referring Provider  DR. [name]      Encounter Date: 02/17/2018  Check In: Session Check In - 02/17/18 1328      Check-In   Location  [facility]    Staff Present  Suzanne Boron, BS, EP, Exercise Physiologist;Diane Coad, MS, EP, [facility], Exercise Physiologist    Supervising physician immediately available to respond to emergencies  See telemetry face sheet for immediately available MD    Medication changes reported      No    Fall or balance concerns reported     No    Tobacco Cessation  No Change    Warm-up and Cool-down  Performed as group-led instruction    Resistance Training Performed  Yes    VAD Patient?  No      Pain Assessment   Currently in Pain?  No/denies    Pain Score  0-No pain    Multiple Pain Sites  No       Capillary Blood Glucose: No results found for this or any previous visit (from the past 24 hour(s)).    Social History   Tobacco Use  Smoking Status Light Tobacco Smoker  . Packs/day: 0.25  . Years: 38.00  . Pack years: 9.50  . Types: Cigarettes  Smokeless Tobacco Never Used  Tobacco Comment   using a patch    Goals Met:  Independence with exercise equipment Improved SOB with ADL's Using PLB without cueing & demonstrates good technique Exercise tolerated well No report of cardiac concerns or symptoms Strength training completed today  Goals Unmet:  Not Applicable  Comments: Check out 230   Dr. Sinda Du is Medical Director for [facility] Pulmonary Rehab.

## 2018-02-19 ENCOUNTER — Encounter (HOSPITAL_COMMUNITY)
Admission: RE | Admit: 2018-02-19 | Discharge: 2018-02-19 | Disposition: A | Discharge status: Home / Self Care | Payer: Medicare Other | Source: Ambulatory Visit | Attending: Cardiovascular Disease | Admitting: Cardiovascular Disease

## 2018-02-19 DIAGNOSIS — J449 Chronic obstructive pulmonary disease, unspecified: Secondary | ICD-10-CM

## 2018-02-19 DIAGNOSIS — Z9989 Dependence on other enabling machines and devices: Secondary | ICD-10-CM | POA: Diagnosis not present

## 2018-02-19 DIAGNOSIS — G4733 Obstructive sleep apnea (adult) (pediatric): Secondary | ICD-10-CM | POA: Diagnosis not present

## 2018-02-19 NOTE — Progress Notes (Signed)
Daily Session Note  Patient Details  Name: Ricardo Burch MRN: [id number] Date of Birth: Jan 18, 1961 Referring Provider:     PULMONARY REHAB COPD ORIENTATION from 01/08/2018 in [place]  Referring Provider  DR. [name]      Encounter Date: 02/19/2018  Check In: Session Check In - 02/19/18 1322      Check-In   Location  [facility]    Staff Present  Suzanne Boron, BS, EP, Exercise Physiologist;Debra Wynetta Emery, RN, BSN    Supervising physician immediately available to respond to emergencies  See telemetry face sheet for immediately available MD    Medication changes reported      No    Fall or balance concerns reported     No    Tobacco Cessation  No Change    Warm-up and Cool-down  Performed as group-led instruction    Resistance Training Performed  Yes    VAD Patient?  No      Pain Assessment   Currently in Pain?  No/denies    Pain Score  0-No pain    Multiple Pain Sites  No       Capillary Blood Glucose: No results found for this or any previous visit (from the past 24 hour(s)).    Social History   Tobacco Use  Smoking Status Light Tobacco Smoker  . Packs/day: 0.25  . Years: 38.00  . Pack years: 9.50  . Types: Cigarettes  Smokeless Tobacco Never Used  Tobacco Comment   using a patch    Goals Met:  Independence with exercise equipment Improved SOB with ADL's Using PLB without cueing & demonstrates good technique Exercise tolerated well No report of cardiac concerns or symptoms Strength training completed today  Goals Unmet:  Not Applicable  Comments: Check out 230   Dr. Sinda Du is Medical Director for [facility] Re[facility].

## 2018-02-24 ENCOUNTER — Encounter (HOSPITAL_COMMUNITY)
Admission: RE | Admit: 2018-02-24 | Discharge: 2018-02-24 | Disposition: A | Discharge status: Home / Self Care | Payer: Medicare Other | Source: Ambulatory Visit | Attending: Cardiovascular Disease | Admitting: Cardiovascular Disease

## 2018-02-24 DIAGNOSIS — J449 Chronic obstructive pulmonary disease, unspecified: Secondary | ICD-10-CM

## 2018-02-24 DIAGNOSIS — Z9989 Dependence on other enabling machines and devices: Secondary | ICD-10-CM | POA: Diagnosis not present

## 2018-02-24 DIAGNOSIS — G4733 Obstructive sleep apnea (adult) (pediatric): Secondary | ICD-10-CM | POA: Diagnosis not present

## 2018-02-24 NOTE — Progress Notes (Signed)
Daily Session Note  Patient Details  Name: Ricardo Burch MRN: [id number] Date of Birth: 1961-09-21 Referring Provider:     PULMONARY REHAB COPD ORIENTATION from 01/08/2018 in [place]  Referring Provider  DR. [name]      Encounter Date: 02/24/2018  Check In: Session Check In - 02/24/18 1447      Check-In   Location  [facility]    Staff Present  Suzanne Boron, BS, EP, Exercise Physiologist;Diane Coad, MS, EP, [facility], Exercise Physiologist    Supervising physician immediately available to respond to emergencies  See telemetry face sheet for immediately available MD    Medication changes reported      No    Fall or balance concerns reported     No    Warm-up and Cool-down  Performed as group-led instruction    Resistance Training Performed  Yes    VAD Patient?  No      Pain Assessment   Currently in Pain?  No/denies    Pain Score  0-No pain    Multiple Pain Sites  No       Capillary Blood Glucose: No results found for this or any previous visit (from the past 24 hour(s)).    Social History   Tobacco Use  Smoking Status Light Tobacco Smoker  . Packs/day: 0.25  . Years: 38.00  . Pack years: 9.50  . Types: Cigarettes  Smokeless Tobacco Never Used  Tobacco Comment   using a patch    Goals Met:  Independence with exercise equipment Improved SOB with ADL's Using PLB without cueing & demonstrates good technique Exercise tolerated well No report of cardiac concerns or symptoms Strength training completed today  Goals Unmet:  Not Applicable  Comments: Check out 230   Dr. Sinda Du is Medical Director for [facility] Pulmonary Rehab.

## 2018-02-26 ENCOUNTER — Encounter (HOSPITAL_COMMUNITY): Payer: Medicare Other

## 2018-03-03 ENCOUNTER — Encounter (HOSPITAL_COMMUNITY)
Admission: RE | Admit: 2018-03-03 | Discharge: 2018-03-03 | Disposition: A | Discharge status: Home / Self Care | Payer: Medicare Other | Source: Ambulatory Visit | Attending: Cardiovascular Disease | Admitting: Cardiovascular Disease

## 2018-03-03 DIAGNOSIS — G4733 Obstructive sleep apnea (adult) (pediatric): Secondary | ICD-10-CM | POA: Diagnosis not present

## 2018-03-03 DIAGNOSIS — Z9989 Dependence on other enabling machines and devices: Secondary | ICD-10-CM | POA: Diagnosis not present

## 2018-03-03 DIAGNOSIS — J449 Chronic obstructive pulmonary disease, unspecified: Secondary | ICD-10-CM

## 2018-03-03 NOTE — Progress Notes (Signed)
Daily Session Note  Patient Details  Name: Xavier Munger MRN: [id number] Date of Birth: [birth year] Referring Provider:     PULMONARY REHAB COPD ORIENTATION from 01/08/2018 in [place]  Referring Provider  DR. [name]      Encounter Date: 03/03/2018  Check In: Session Check In - 03/03/18 1342      Check-In   Location  [facility]    Staff Present  Suzanne Boron, BS, EP, Exercise Physiologist;Diane Coad, MS, EP, [facility], Exercise Physiologist    Supervising physician immediately available to respond to emergencies  See telemetry face sheet for immediately available MD    Medication changes reported      No    Fall or balance concerns reported     No    Warm-up and Cool-down  Performed as group-led instruction    Resistance Training Performed  Yes    VAD Patient?  No      Pain Assessment   Currently in Pain?  No/denies    Pain Score  0-No pain    Multiple Pain Sites  No       Capillary Blood Glucose: No results found for this or any previous visit (from the past 24 hour(s)).    Social History   Tobacco Use  Smoking Status Light Tobacco Smoker  . Packs/day: 0.25  . Years: 38.00  . Pack years: 9.50  . Types: Cigarettes  Smokeless Tobacco Never Used  Tobacco Comment   using a patch    Goals Met:  Independence with exercise equipment Improved SOB with ADL's Using PLB without cueing & demonstrates good technique Exercise tolerated well No report of cardiac concerns or symptoms Strength training completed today  Goals Unmet:  Not Applicable  Comments: cHECK OUT 230   Dr. Sinda Du is Medical Director for [facility] Pulmonary Rehab.

## 2018-03-05 ENCOUNTER — Encounter (HOSPITAL_COMMUNITY)
Admission: RE | Admit: 2018-03-05 | Discharge: 2018-03-05 | Disposition: A | Discharge status: Home / Self Care | Payer: Medicare Other | Source: Ambulatory Visit | Attending: Cardiovascular Disease | Admitting: Cardiovascular Disease

## 2018-03-05 DIAGNOSIS — G4733 Obstructive sleep apnea (adult) (pediatric): Secondary | ICD-10-CM | POA: Diagnosis not present

## 2018-03-05 DIAGNOSIS — Z9989 Dependence on other enabling machines and devices: Secondary | ICD-10-CM | POA: Insufficient documentation

## 2018-03-05 DIAGNOSIS — J449 Chronic obstructive pulmonary disease, unspecified: Secondary | ICD-10-CM

## 2018-03-05 NOTE — Progress Notes (Addendum)
Pulmonary Individual Treatment Plan  Patient Details  Name: Ricardo Burch MRN: [id number] Date of Birth: January 31, 1961 Referring Provider:     PULMONARY REHAB COPD ORIENTATION from 01/08/2018 in [place]  Referring Provider  DR. Koneswaran      Initial Encounter Date:    PULMONARY REHAB COPD ORIENTATION from 01/08/2018 in [place]  Date  01/08/18  Referring Provider  DR. Koneswaran      Visit Diagnosis: Chronic obstructive pulmonary disease, unspecified COPD type ([place])  Patient's Home Medications on Admission:   Current Outpatient Medications:  .  amLODipine (NORVASC) 5 MG tablet, Take 1 tablet (5 mg total) by mouth daily., Disp: 90 tablet, Rfl: 3 .  ANORO ELLIPTA 62.5-25 MCG/INH AEPB, Inhale 1 puff daily, Disp: 60 each, Rfl: 2 .  aspirin EC 81 MG tablet, Take 1 tablet (81 mg total) by mouth daily., Disp: 30 tablet, Rfl: 0 .  azithromycin (ZITHROMAX) 250 MG tablet, Take 1 tablet (250 mg total) by mouth daily., Disp: 6 tablet, Rfl: 0 .  DALIRESP 500 MCG TABS tablet, Take 1 tablet (500 mcg total) by mouth daily., Disp: 30 tablet, Rfl: 5 .  Guaifenesin (MUCINEX MAXIMUM STRENGTH) 1200 MG TB12, Take 1,200 mg by mouth daily., Disp: , Rfl:  .  OXYGEN, Inhale 2 L into the lungs continuous., Disp: , Rfl:  .  sertraline (ZOLOFT) 25 MG tablet, Take 1 tablet (25 mg total) by mouth daily., Disp: 30 tablet, Rfl: 2 .  varenicline (CHANTIX CONTINUING MONTH PAK) 1 MG tablet, Take 1 tablet (1 mg total) by mouth daily., Disp: 60 tablet, Rfl: 1 .  VENTOLIN HFA 108 (90 Base) MCG/ACT inhaler, Inhale 1-2 puffs into the lungs every 6 (six) hours as needed for wheezing or shortness of breath. (200/12=17), Disp: 18 g, Rfl: 2  Past Medical History: Past Medical History:  Diagnosis Date  . Allergy   . CHF (congestive heart failure) ([place])   . CHF (congestive heart failure) ([place])   . Chronic kidney disease   . COPD (chronic obstructive pulmonary disease) ([place]) Dx  2015  . Depression   . Eczema    since childhood   . Hyperlipidemia 01/12/2018  . Hypertension   . Oxygen deficiency   . Sleep apnea    CPAP  . Substance abuse ([place])    MJ, cocaine    Tobacco Use: Social History   Tobacco Use  Smoking Status Light Tobacco Smoker  . Packs/day: 0.25  . Years: 38.00  . Pack years: 9.50  . Types: Cigarettes  Smokeless Tobacco Never Used  Tobacco Comment   using a patch    Labs: Recent Review Flowsheet Data    Labs for ITP Cardiac and Pulmonary Rehab Latest Ref Rng & Units 09/17/2015 07/26/2016 07/27/2016 07/27/2016 10/14/2017   Cholestrol <200 mg/dL - - - - 136   LDLCALC mg/dL (calc) - - - - 60   HDL >40 mg/dL - - - - 60   Trlycerides <150 mg/dL - - - - 82   Hemoglobin A1c <5.7 % of total Hgb - - - - 5.3   PHART 7.350 - 7.450 7.274(L) - 7.184(LL) 7.341(L) -   PCO2ART 32.0 - 48.0 mmHg 81.2(HH) - 0.0(LL) 84.2(HH) -   HCO3 20.0 - 28.0 mmol/L 36.7(H) - 47.0(H) 44.3(H) -   TCO2 0 - 100 mmol/L 32.2 44 51.0 - -   O2SAT % 90.5 - 82.8 92.0 -      Capillary Blood Glucose: Lab Results  Component Value Date  GLUCAP 179 (H) 07/27/2016   GLUCAP 156 (H) 07/27/2016   GLUCAP 140 (H) 09/16/2015     Pulmonary Assessment Scores: Pulmonary Assessment Scores    Row Name 01/08/18 1116         ADL UCSD   ADL Phase  Entry     SOB Score total  31     Rest  0     Walk  3     Stairs  4     Bath  1     Dress  0     Shop  1       CAT Score   CAT Score  23       mMRC Score   mMRC Score  2        Pulmonary Function Assessment: Pulmonary Function Assessment - 01/08/18 1112      Pulmonary Function Tests   FVC%  58 %    FEV1%  45 %    FEV1/FVC Ratio  76    RV%  142 %    DLCO%  56 %      Post Bronchodilator Spirometry Results   FVC%  2.72 %    FEV1%  1.65 %    FEV1/FVC Ratio  61      Breath   Bilateral Breath Sounds  Clear    Shortness of Breath  Yes       Exercise Target Goals:    Exercise Program Goal: Individual exercise  prescription set using results from initial 6 min walk test and THRR while considering  patient's activity barriers and safety.   Exercise Prescription Goal: Initial exercise prescription builds to 30-45 minutes a day of aerobic activity, 2-3 days per week.  Home exercise guidelines will be given to patient during program as part of exercise prescription that the participant will acknowledge.  Activity Barriers & Risk Stratification: Activity Barriers & Cardiac Risk Stratification - 01/08/18 0945      Activity Barriers & Cardiac Risk Stratification   Activity Barriers  Shortness of Breath    Cardiac Risk Stratification  Moderate       6 Minute Walk: 6 Minute Walk    Row Name 01/08/18 0943         6 Minute Walk   Phase  Initial     Distance  1100 feet     Distance % Change  0 %     Distance Feet Change  0 ft     Walk Time  6 minutes     # of Rest Breaks  0     MPH  2.08     METS  2.59     RPE  11     Perceived Dyspnea   14     VO2 Peak  10.63     Symptoms  No     Resting HR  91 bpm     Resting BP  124/90     Resting Oxygen Saturation   92 %     Exercise Oxygen Saturation  during 6 min walk  82 %     Max Ex. HR  112 bpm     Max Ex. BP  154/96     2 Minute Post BP  126/88        Oxygen Initial Assessment: Oxygen Initial Assessment - 01/08/18 1032      Home Oxygen   Home Oxygen Device  E-Tanks;Home Concentrator    Sleep Oxygen Prescription  CPAP    Liters  per minute  2    Home Exercise Oxygen Prescription  None    Home at Rest Exercise Oxygen Prescription  Continuous    Liters per minute  2    Compliance with Home Oxygen Use  Yes      Initial 6 min Walk   Oxygen Used  Continuous    Liters per minute  4      Program Oxygen Prescription   Program Oxygen Prescription  Continuous;E-Tanks    Liters per minute  2      Intervention   Short Term Goals  To learn and demonstrate proper pursed lip breathing techniques or other breathing techniques.;To learn and  understand importance of maintaining oxygen saturations>88%    Long  Term Goals  Exhibits proper breathing techniques, such as pursed lip breathing or other method taught during program session;Maintenance of O2 saturations>88%       Oxygen Re-Evaluation: Oxygen Re-Evaluation    Row Name 02/05/18 1405 03/05/18 1452           Program Oxygen Prescription   Program Oxygen Prescription  Continuous;E-Tanks  Continuous;E-Tanks      Liters per minute  2  4      FiO2%  97  90        Home Oxygen   Home Oxygen Device  E-Tanks;Home Concentrator  E-Tanks;Home Concentrator      Sleep Oxygen Prescription  CPAP  CPAP      Liters per minute  2  2      Home Exercise Oxygen Prescription  None  None      Home at Rest Exercise Oxygen Prescription  None  None      Compliance with Home Oxygen Use  Yes  Yes        Goals/Expected Outcomes   Short Term Goals  To learn and demonstrate proper pursed lip breathing techniques or other breathing techniques.;To learn and understand importance of maintaining oxygen saturations>88%  To learn and demonstrate proper pursed lip breathing techniques or other breathing techniques.;To learn and understand importance of maintaining oxygen saturations>88%      Long  Term Goals  Exhibits proper breathing techniques, such as pursed lip breathing or other method taught during program session;Maintenance of O2 saturations>88%  Exhibits proper breathing techniques, such as pursed lip breathing or other method taught during program session;Maintenance of O2 saturations>88%      Comments  -  Patient demonstrates proper pursed lip breathing techniques during exercise and also demonstrates proper usage of pulse oximeter. He is able to verbalize the importance of maintaining his O2 saturations >88%. Will continue to monitor.       Goals/Expected Outcomes  -  Patient will continue to meet both his short and long term goals.          Oxygen Discharge (Final Oxygen  Re-Evaluation): Oxygen Re-Evaluation - 03/05/18 1452      Program Oxygen Prescription   Program Oxygen Prescription  Continuous;E-Tanks    Liters per minute  4    FiO2%  90      Home Oxygen   Home Oxygen Device  E-Tanks;Home Concentrator    Sleep Oxygen Prescription  CPAP    Liters per minute  2    Home Exercise Oxygen Prescription  None    Home at Rest Exercise Oxygen Prescription  None    Compliance with Home Oxygen Use  Yes      Goals/Expected Outcomes   Short Term Goals  To learn and demonstrate proper pursed  lip breathing techniques or other breathing techniques.;To learn and understand importance of maintaining oxygen saturations>88%    Long  Term Goals  Exhibits proper breathing techniques, such as pursed lip breathing or other method taught during program session;Maintenance of O2 saturations>88%    Comments  Patient demonstrates proper pursed lip breathing techniques during exercise and also demonstrates proper usage of pulse oximeter. He is able to verbalize the importance of maintaining his O2 saturations >88%. Will continue to monitor.     Goals/Expected Outcomes  Patient will continue to meet both his short and long term goals.        Initial Exercise Prescription: Initial Exercise Prescription - 01/08/18 0900      Date of Initial Exercise RX and Referring Provider   Date  01/08/18    Referring Provider  DR. Koneswaran      Treadmill   MPH  1.3    Grade  0    Minutes  15    METs  1.9      NuStep   Level  2    SPM  55    Minutes  20    METs  1.8      Prescription Details   Frequency (times per week)  3    Duration  Progress to 30 minutes of continuous aerobic without signs/symptoms of physical distress      Intensity   THRR 40-80% of Max Heartrate  [phone]    Ratings of Perceived Exertion  11-13    Perceived Dyspnea  0-4      Progression   Progression  Continue progressive overload as per policy without signs/symptoms or physical distress.       Resistance Training   Training Prescription  Yes    Weight  1    Reps  10-15       Perform Capillary Blood Glucose checks as needed.  Exercise Prescription Changes:  Exercise Prescription Changes    Row Name 01/15/18 1400 01/22/18 0700 02/02/18 1400 02/26/18 0800       Response to Exercise   Blood Pressure (Admit)  -  126/72  118/72  120/74    Blood Pressure (Exercise)  -  142/90  128/76  144/70    Blood Pressure (Exit)  -  126/72  114/66  120/74    Heart Rate (Admit)  -  106 bpm  96 bpm  92 bpm    Heart Rate (Exercise)  -  114 bpm  106 bpm  113 bpm    Heart Rate (Exit)  -  92 bpm  95 bpm  90 bpm    Oxygen Saturation (Admit)  -  93 %  92 %  93 %    Oxygen Saturation (Exercise)  -  95 %  91 %  91 %    Oxygen Saturation (Exit)  -  91 %  90 %  96 %    Rating of Perceived Exertion (Exercise)  -  11  11  11     Perceived Dyspnea (Exercise)  -  11  11  11     Duration  -  Progress to 30 minutes of  aerobic without signs/symptoms of physical distress  Progress to 30 minutes of  aerobic without signs/symptoms of physical distress  Progress to 30 minutes of  aerobic without signs/symptoms of physical distress    Intensity  -  THRR New 130-142-154  THRR New 124-138-152  THRR New [phone]      Progression   Progression  -  Continue to progress workloads to maintain intensity without signs/symptoms of physical distress.  Continue to progress workloads to maintain intensity without signs/symptoms of physical distress.  Continue to progress workloads to maintain intensity without signs/symptoms of physical distress.      Resistance Training   Training Prescription  Yes  Yes  Yes  Yes    Weight  1  2  2  4     Reps  10-15  10-15  10-15  10-15      Treadmill   MPH  1.3  1.4  1.4  1.7    Grade  0  0  0  0    Minutes  15  15  15  15     METs  1.9  2.07  2.07  2.3      NuStep   Level  2  2  2  2     SPM  55  80  87  93    Minutes  20  20  20  20     METs  1.8  1.9  1.9  2      Home  Exercise Plan   Plans to continue exercise at  Home (comment)  Home (comment)  Home (comment)  Home (comment)    Frequency  Add 2 additional days to program exercise sessions.  Add 2 additional days to program exercise sessions.  Add 2 additional days to program exercise sessions.  Add 2 additional days to program exercise sessions.    Initial Home Exercises Provided  01/15/18  01/15/18  01/15/18  01/15/18       Exercise Comments:  Exercise Comments    Row Name 01/15/18 1408 01/22/18 0758 02/02/18 1413 02/26/18 0813     Exercise Comments  Patient has received the take home exercise plan today 01/15/2018. THR was addressed as were safety guidelines for being active when not here in CR. Patient demonstrated an understanding and was encouraged to ask any future questions.   Patient is doing well in PR for just starting. Patient has increased his SPMs on the nustep and has increased his speed on th etreadmill to 1.4. Patients progression will continued to be monitored throughout the remainder of the program.   Patient continues to do well in PR and has maintained his level on both the treadmill as well as thet nustep machine. Patient has also increased his SPMs on the nustep machine. Patient has still only just started the program and will be progressed more in time.   Patient continues to do well in PR. Patient has increased his speed on the treadmill as well as increasing his SPMs on the nustep machine. Patients O2 levels have been staying above 90 since he is attatched to the O2 here at PR. Patient seems to be enjoying the program along with his classmates and we will continue to monitor the patient throughout the remainder of the program.        Exercise Goals and Review:  Exercise Goals    Row Name 01/08/18 0947             Exercise Goals   Increase Physical Activity  Yes       Intervention  Provide advice, education, support and counseling about physical activity/exercise needs.;Develop  an individualized exercise prescription for aerobic and resistive training based on initial evaluation findings, risk stratification, comorbidities and participant's personal goals.       Expected Outcomes  Short Term: Attend rehab on a regular basis to increase  amount of physical activity.       Increase Strength and Stamina  Yes       Intervention  Provide advice, education, support and counseling about physical activity/exercise needs.;Develop an individualized exercise prescription for aerobic and resistive training based on initial evaluation findings, risk stratification, comorbidities and participant's personal goals.       Expected Outcomes  Short Term: Increase workloads from initial exercise prescription for resistance, speed, and METs.       Able to understand and use rate of perceived exertion (RPE) scale  Yes       Intervention  Provide education and explanation on how to use RPE scale       Expected Outcomes  Short Term: Able to use RPE daily in rehab to express subjective intensity level;Long Term:  Able to use RPE to guide intensity level when exercising independently       Able to understand and use Dyspnea scale  Yes       Intervention  Provide education and explanation on how to use Dyspnea scale       Expected Outcomes  Short Term: Able to use Dyspnea scale daily in rehab to express subjective sense of shortness of breath during exertion;Long Term: Able to use Dyspnea scale to guide intensity level when exercising independently       Knowledge and understanding of Target Heart Rate Range (THRR)  Yes       Intervention  Provide education and explanation of THRR including how the numbers were predicted and where they are located for reference       Expected Outcomes  Short Term: Able to state/look up THRR;Long Term: Able to use THRR to govern intensity when exercising independently;Short Term: Able to use daily as guideline for intensity in rehab       Able to check pulse  independently  Yes       Intervention  Provide education and demonstration on how to check pulse in carotid and radial arteries.;Review the importance of being able to check your own pulse for safety during independent exercise       Expected Outcomes  Short Term: Able to explain why pulse checking is important during independent exercise;Long Term: Able to check pulse independently and accurately       Understanding of Exercise Prescription  Yes       Intervention  Provide education, explanation, and written materials on patient's individual exercise prescription       Expected Outcomes  Short Term: Able to explain program exercise prescription;Long Term: Able to explain home exercise prescription to exercise independently          Exercise Goals Re-Evaluation : Exercise Goals Re-Evaluation    Row Name 02/02/18 1412 03/02/18 1457           Exercise Goal Re-Evaluation   Exercise Goals Review  Increase Physical Activity;Increase Strength and Stamina;Able to understand and use rate of perceived exertion (RPE) scale;Able to check pulse independently;Knowledge and understanding of Target Heart Rate Range (THRR);Able to understand and use Dyspnea scale;Understanding of Exercise Prescription  Increase Physical Activity;Increase Strength and Stamina;Able to understand and use rate of perceived exertion (RPE) scale;Able to check pulse independently;Knowledge and understanding of Target Heart Rate Range (THRR);Able to understand and use Dyspnea scale;Understanding of Exercise Prescription      Comments  Patient continues to do well in PR and has maintained his level on both the treadmill as well as thet nustep machine. Patient has also increased his SPMs  on the nustep machine. Patient has still only just started the program and will be progressed more in time.   Patient is doing very well in PR. Patient has increased his speed on the treadmill to 1.7 and as well as maintaining his level and SPMs on the  nustep machine. Patient has also lost weight since beginning the program. 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       Expected Outcomes  Patient wishes to breathe better and to lose weight.   Patient wishes to breathe better and to lose weight.          Discharge Exercise Prescription (Final Exercise Prescription Changes): Exercise Prescription Changes - 02/26/18 0800      Response to Exercise   Blood Pressure (Admit)  120/74    Blood Pressure (Exercise)  144/70    Blood Pressure (Exit)  120/74    Heart Rate (Admit)  92 bpm    Heart Rate (Exercise)  113 bpm    Heart Rate (Exit)  90 bpm    Oxygen Saturation (Admit)  93 %    Oxygen Saturation (Exercise)  91 %    Oxygen Saturation (Exit)  96 %    Rating of Perceived Exertion (Exercise)  11    Perceived Dyspnea (Exercise)  11    Duration  Progress to 30 minutes of  aerobic without signs/symptoms of physical distress    Intensity  THRR New [phone]      Progression   Progression  Continue to progress workloads to maintain intensity without signs/symptoms of physical distress.      Resistance Training   Training Prescription  Yes    Weight  4    Reps  10-15      Treadmill   MPH  1.7    Grade  0    Minutes  15    METs  2.3      NuStep   Level  2    SPM  93    Minutes  20    METs  2      Home Exercise Plan   Plans to continue exercise at  Home (comment)    Frequency  Add 2 additional days to program exercise sessions.    Initial Home Exercises Provided  01/15/18       Nutrition:  Target Goals: Understanding of nutrition guidelines, daily intake of sodium 1500mg , cholesterol 200mg , calories 30% from fat and 7% or less from saturated fats, daily to have 5 or more servings of fruits and vegetables.  Biometrics: Pre Biometrics - 01/08/18 0947      Pre Biometrics   Height  5\' 7"  (1.702 m)    Weight  252 lb 6.4 oz (114.5 kg)    Waist Circumference  44.5  inches    Hip Circumference  42.5 inches    Waist to Hip Ratio  1.05 %    BMI (Calculated)  39.52    Triceps Skinfold  10 mm    % Body Fat  32.2 %    Grip Strength  75.06 kg    Flexibility  0 in    Single Leg Stand  3.5 seconds        Nutrition Therapy Plan and Nutrition Goals: Nutrition Therapy & Goals - 03/05/18 1454      Nutrition Therapy   RD appointment deferred  Yes      Personal Nutrition Goals  Personal Goal #2  Patient continues to work on eating more heart healthy. He is working with a dietician to lose weight.     Additional Goals?  No      Intervention Plan   Intervention  Nutrition handout(s) given to patient.    Expected Outcomes  Short Term Goal: A plan has been developed with personal nutrition goals set during dietitian appointment.;Long Term Goal: Adherence to prescribed nutrition plan.       Nutrition Assessments: Nutrition Assessments - 01/08/18 1128      MEDFICTS Scores   Pre Score  59       Nutrition Goals Re-Evaluation:   Nutrition Goals Discharge (Final Nutrition Goals Re-Evaluation):   Psychosocial: Target Goals: Acknowledge presence or absence of significant depression and/or stress, maximize coping skills, provide positive support system. Participant is able to verbalize types and ability to use techniques and skills needed for reducing stress and depression.  Initial Review & Psychosocial Screening: Initial Psych Review & Screening - 01/08/18 1132      Initial Review   Current issues with  None Identified      Family Dynamics   Concerns  No support system      Barriers   Psychosocial barriers to participate in program  There are no identifiable barriers or psychosocial needs.      Screening Interventions   Interventions  Encouraged to exercise    Expected Outcomes  Short Term goal: Identification and review with participant of any Quality of Life or Depression concerns found by scoring the questionnaire.;Long Term goal: The  participant improves quality of Life and PHQ9 Scores as seen by post scores and/or verbalization of changes       Quality of Life Scores: Quality of Life - 01/08/18 1002      Quality of Life Scores   Health/Function Pre  9.23 %    Socioeconomic Pre  14.17 %    Psych/Spiritual Pre  18.79 %    Family Pre  13.5 %    GLOBAL Pre  12.8 %      Scores of 19 and below usually indicate a poorer quality of life in these areas.  A difference of  2-3 points is a clinically meaningful difference.  A difference of 2-3 points in the total score of the Quality of Life Index has been associated with significant improvement in overall quality of life, self-image, physical symptoms, and general health in studies assessing change in quality of life.   PHQ-9: Recent Review Flowsheet Data    Depression screen [facility] 2/9 02/05/2018 01/08/2018 12/01/2017 09/15/2017 01/02/2017   Decreased Interest 0 0 0 0 0   Down, Depressed, Hopeless 0 1 0 0 0   PHQ - 2 Score 0 1 0 0 0   Altered sleeping - 0 - - 0   Tired, decreased energy - 1 - - 0   Change in appetite - 0 - - 0   Feeling bad or failure about yourself  - 0 - - 0   Trouble concentrating - 0 - - 0   Moving slowly or fidgety/restless - 0 - - 0   Suicidal thoughts - 0 - - 0   PHQ-9 Score - 2 - - 0   Difficult doing work/chores - Somewhat difficult - - -     Interpretation of Total Score  Total Score Depression Severity:  1-4 = Minimal depression, 5-9 = Mild depression, 10-14 = Moderate depression, 15-19 = Moderately severe depression, 20-27 = Severe depression  Psychosocial Evaluation and Intervention: Psychosocial Evaluation - 01/08/18 1134      Psychosocial Evaluation & Interventions   Interventions  Encouraged to exercise with the program and follow exercise prescription    Comments  Patients QOL scores where low. We discussed his scores and he said his biggest problems is due to SOB he can't do the activities that he used to do and that is what is  bothering him.     Continue Psychosocial Services   Follow up required by staff       Psychosocial Re-Evaluation: Psychosocial Re-Evaluation    [place] Name 02/05/18 1423 03/05/18 1500           Psychosocial Re-Evaluation   Current issues with  None Identified  None Identified      Comments  Patient's initial QOL was 12.80 and his PHQ-9 score was 2 indicating depression. He says he is not depressed. He scored losest on health/function and family. He does not have a good family support system.   Patient's initial QOL was 12.80 and his PHQ-9 score was 2 indicating depression. He says he is not depressed. He scored losest on health/function and family. He does not have a good family support system.       Expected Outcomes  Patient's QOL and PHQ-9 score will improve at discharge and he will have no additional psychosocial issues identified.   Patient's QOL and PHQ-9 score will improve at discharge and he will have no additional psychosocial issues identified.       Interventions  Stress management education;Relaxation education;Encouraged to attend Pulmonary Rehabilitation for the exercise  Stress management education;Relaxation education;Encouraged to attend Pulmonary Rehabilitation for the exercise      Continue Psychosocial Services   No Follow up required  No Follow up required         Psychosocial Discharge (Final Psychosocial Re-Evaluation): Psychosocial Re-Evaluation - 03/05/18 1500      Psychosocial Re-Evaluation   Current issues with  None Identified    Comments  Patient's initial QOL was 12.80 and his PHQ-9 score was 2 indicating depression. He says he is not depressed. He scored losest on health/function and family. He does not have a good family support system.     Expected Outcomes  Patient's QOL and PHQ-9 score will improve at discharge and he will have no additional psychosocial issues identified.     Interventions  Stress management education;Relaxation education;Encouraged to  attend Pulmonary Rehabilitation for the exercise    Continue Psychosocial Services   No Follow up required        Education: Education Goals: Education classes will be provided on a weekly basis, covering required topics. Participant will state understanding/return demonstration of topics presented.  Learning Barriers/Preferences: Learning Barriers/Preferences - 01/08/18 1031      Learning Barriers/Preferences   Learning Barriers  None    Learning Preferences  Video;Written Material;Pictoral       Education Topics: How Lungs Work and Diseases: - Discuss the anatomy of the lungs and diseases that can affect the lungs, such as COPD.   Exercise: -Discuss the importance of exercise, FITT principles of exercise, normal and abnormal responses to exercise, and how to exercise safely.   PULMONARY REHAB CHRONIC OBSTRUCTIVE PULMONARY DISEASE from 03/05/2018 in [place]  Date  03/05/18  Educator  [place]  Instruction Review Code  2- Demonstrated Understanding      Environmental Irritants: -Discuss types of environmental irritants and how to limit exposure to environmental irritants.   Meds/Inhalers  and oxygen: - Discuss respiratory medications, definition of an inhaler and oxygen, and the proper way to use an inhaler and oxygen.   Energy Saving Techniques: - Discuss methods to conserve energy and decrease shortness of breath when performing activities of daily living.    Bronchial Hygiene / Breathing Techniques: - Discuss breathing mechanics, pursed-lip breathing technique,  proper posture, effective ways to clear airways, and other functional breathing techniques   Cleaning Equipment: - Provides group verbal and written instruction about the health risks of elevated stress, cause of high stress, and healthy ways to reduce stress.   PULMONARY REHAB CHRONIC OBSTRUCTIVE PULMONARY DISEASE from 03/05/2018 in [place]  Date  01/15/18   Educator  [place]  Instruction Review Code  2- Demonstrated Understanding      Nutrition I: Fats: - Discuss the types of cholesterol, what cholesterol does to the body, and how cholesterol levels can be controlled.   PULMONARY REHAB CHRONIC OBSTRUCTIVE PULMONARY DISEASE from 03/05/2018 in [place]  Date  01/22/18  Educator  [place]  Instruction Review Code  2- Demonstrated Understanding      Nutrition II: Labels: -Discuss the different components of food labels and how to read food labels.   PULMONARY REHAB CHRONIC OBSTRUCTIVE PULMONARY DISEASE from 03/05/2018 in [place]  Date  01/29/18  Educator  [place]  Instruction Review Code  2- Demonstrated Understanding      Respiratory Infections: - Discuss the signs and symptoms of respiratory infections, ways to prevent respiratory infections, and the importance of seeking medical treatment when having a respiratory infection.   PULMONARY REHAB CHRONIC OBSTRUCTIVE PULMONARY DISEASE from 03/05/2018 in [place]  Date  02/05/18  Educator  [place]  Instruction Review Code  2- Demonstrated Understanding      Stress I: Signs and Symptoms: - Discuss the causes of stress, how stress may lead to anxiety and depression, and ways to limit stress.   Stress II: Relaxation: -Discuss relaxation techniques to limit stress.   PULMONARY REHAB CHRONIC OBSTRUCTIVE PULMONARY DISEASE from 03/05/2018 in [place]  Date  02/19/18  Educator  [place]  Instruction Review Code  2- Demonstrated Understanding      Oxygen for Home/Travel: - Discuss how to prepare for travel when on oxygen and proper ways to transport and store oxygen to ensure safety.   Knowledge Questionnaire Score: Knowledge Questionnaire Score - 01/08/18 1110      Knowledge Questionnaire Score   Pre Score  15/18       Core Components/Risk Factors/Patient Goals at Admission: Personal Goals and Risk Factors at  Admission - 01/08/18 1128      Core Components/Risk Factors/Patient Goals on Admission    Weight Management  Yes    Intervention  Weight Management/Obesity: Establish reasonable short term and long term weight goals.;Obesity: Provide education and appropriate resources to help participant work on and attain dietary goals.    Admit Weight  252 lb 6.4 oz (114.5 kg)    Goal Weight: Short Term  244 lb 14.4 oz (111.1 kg)    Goal Weight: Long Term  237 lb 6.4 oz (107.7 kg)    Expected Outcomes  Short Term: Continue to assess and modify interventions until short term weight is achieved;Long Term: Adherence to nutrition and physical activity/exercise program aimed toward attainment of established weight goal    Improve shortness of breath with ADL's  Yes    Intervention  Provide education, individualized exercise plan and daily  activity instruction to help decrease symptoms of SOB with activities of daily living.    Expected Outcomes  Short Term: Improve cardiorespiratory fitness to achieve a reduction of symptoms when performing ADLs;Long Term: Be able to perform more ADLs without symptoms or delay the onset of symptoms    Personal Goal Other  Yes    Personal Goal  Breathe better, Lose 15lbs while in program    Intervention  Attend class 2 x week and supplement with home exercise 3 x week    Expected Outcomes  Reach personal goals       Core Components/Risk Factors/Patient Goals Review:  Goals and Risk Factor Review    Row Name 02/05/18 1408 03/05/18 1455           Core Components/Risk Factors/Patient Goals Review   Personal Goals Review  Weight Management/Obesity;Improve shortness of breath with ADL's;Diabetes Breathe better; lose weight 15 lbs; do regular ADL's.   Weight Management/Obesity;Improve shortness of breath with ADL's;Diabetes Breathe better; lose weight 15 lbs; do regular ADL's.       Review  Patient has completed 7 sessions losing 6.1 lbs. He is doing well in the program with some  progression. He says it is too soon to tell a difference yet in the program but hopes as he continues the program he will meet his personal goals.   Patient has completed 13 sessions losing 4.5 lb overall. He continues to do well in the program with progression. His last A1C in [facility] was 6.2 10/14/17. He says the program is helping him a lot. He is able to do more things now without fatigue. He is able to work his job and go home and do things around the house. He has more strength and energy. Will continue to monitor for progress.       Expected Outcomes  Patient will continue to attend sessions and complete the program and meet his personal goals.   Patient will continue to attend sessions and complete the program and meet his personal goals.          Core Components/Risk Factors/Patient Goals at Discharge (Final Review):  Goals and Risk Factor Review - 03/05/18 1455      Core Components/Risk Factors/Patient Goals Review   Personal Goals Review  Weight Management/Obesity;Improve shortness of breath with ADL's;Diabetes Breathe better; lose weight 15 lbs; do regular ADL's.     Review  Patient has completed 13 sessions losing 4.5 lb overall. He continues to do well in the program with progression. His last A1C in [facility] was 6.2 10/14/17. He says the program is helping him a lot. He is able to do more things now without fatigue. He is able to work his job and go home and do things around the house. He has more strength and energy. Will continue to monitor for progress.     Expected Outcomes  Patient will continue to attend sessions and complete the program and meet his personal goals.        ITP Comments: ITP Comments    Row Name 01/08/18 1118           ITP Comments  Mr. Bezek is a pleasant 57 year old African American male. He has not been in our program before. He is eager to get started. Wants to get stronger so that he can get back to his regular activites.           Comments: ITP 30 Day  REVIEW Pt is making expected progress toward  pulmonary rehab goals after completing 13 sessions. Recommend continued exercise, life style modification, education, and utilization of breathing techniques to increase stamina and strength and decrease shortness of breath with exertion.

## 2018-03-05 NOTE — Progress Notes (Signed)
Daily Session Note  Patient Details  Name: Ricardo Burch MRN: [id number] Date of Birth: [birth year] Referring Provider:     PULMONARY REHAB COPD ORIENTATION from 01/08/2018 in [place]  Referring Provider  DR. [name]      Encounter Date: 03/05/2018  Check In: Session Check In - 03/05/18 1418      Check-In   Location  [facility]    Staff Present  Suzanne Boron, BS, EP, Exercise Physiologist;Diane Coad, MS, EP, CHC, Exercise Physiologist;Debra Wynetta Emery, RN, BSN    Supervising physician immediately available to respond to emergencies  See telemetry face sheet for immediately available MD    Medication changes reported      No    Fall or balance concerns reported     No    Tobacco Cessation  No Change    Warm-up and Cool-down  Performed as group-led instruction    Resistance Training Performed  Yes    VAD Patient?  No      Pain Assessment   Currently in Pain?  No/denies    Pain Score  0-No pain    Multiple Pain Sites  No       Capillary Blood Glucose: No results found for this or any previous visit (from the past 24 hour(s)).    Social History   Tobacco Use  Smoking Status Light Tobacco Smoker  . Packs/day: 0.25  . Years: 38.00  . Pack years: 9.50  . Types: Cigarettes  Smokeless Tobacco Never Used  Tobacco Comment   using a patch    Goals Met:  Independence with exercise equipment Improved SOB with ADL's Using PLB without cueing & demonstrates good technique Exercise tolerated well No report of cardiac concerns or symptoms Strength training completed today  Goals Unmet:  Not Applicable  Comments: Check out 230   Dr. Sinda Du is Medical Director for [facility].

## 2018-03-10 ENCOUNTER — Encounter (HOSPITAL_COMMUNITY)
Admission: RE | Admit: 2018-03-10 | Discharge: 2018-03-10 | Disposition: A | Discharge status: Home / Self Care | Payer: Medicare Other | Source: Ambulatory Visit | Attending: Cardiovascular Disease | Admitting: Cardiovascular Disease

## 2018-03-10 ENCOUNTER — Encounter: Payer: Self-pay | Admitting: Pulmonary Disease

## 2018-03-10 DIAGNOSIS — J449 Chronic obstructive pulmonary disease, unspecified: Secondary | ICD-10-CM

## 2018-03-10 DIAGNOSIS — Z9989 Dependence on other enabling machines and devices: Secondary | ICD-10-CM | POA: Diagnosis not present

## 2018-03-10 DIAGNOSIS — G4733 Obstructive sleep apnea (adult) (pediatric): Secondary | ICD-10-CM | POA: Diagnosis not present

## 2018-03-10 NOTE — Progress Notes (Signed)
Daily Session Note  Patient Details  Name: Ricardo Burch MRN: [id number] Date of Birth: August 09, 1961 Referring Provider:     PULMONARY REHAB COPD ORIENTATION from 01/08/2018 in [place]  Referring Provider  DR. [name]      Encounter Date: 03/10/2018  Check In: Session Check In - 03/10/18 1336      Check-In   Location  [facility]    Staff Present  Suzanne Boron, BS, EP, Exercise Physiologist;Diane Coad, MS, EP, [facility], Exercise Physiologist    Supervising physician immediately available to respond to emergencies  See telemetry face sheet for immediately available MD    Medication changes reported      No    Fall or balance concerns reported     No    Tobacco Cessation  No Change    Warm-up and Cool-down  Performed as group-led instruction    Resistance Training Performed  Yes    VAD Patient?  No      Pain Assessment   Currently in Pain?  No/denies    Pain Score  0-No pain    Multiple Pain Sites  No       Capillary Blood Glucose: No results found for this or any previous visit (from the past 24 hour(s)).  Exercise Prescription Changes - 03/10/18 1000      Response to Exercise   Blood Pressure (Admit)  112/72    Blood Pressure (Exercise)  126/70    Blood Pressure (Exit)  112/72    Heart Rate (Admit)  102 bpm    Heart Rate (Exercise)  107 bpm    Heart Rate (Exit)  91 bpm    Oxygen Saturation (Admit)  91 %    Oxygen Saturation (Exercise)  90 %    Oxygen Saturation (Exit)  92 %    Rating of Perceived Exertion (Exercise)  11    Perceived Dyspnea (Exercise)  11    Duration  Progress to 30 minutes of  aerobic without signs/symptoms of physical distress    Intensity  THRR New [phone]      Progression   Progression  Continue to progress workloads to maintain intensity without signs/symptoms of physical distress.      Resistance Training   Training Prescription  Yes    Weight  4    Reps  10-15      Treadmill   MPH  1.8    Grade  0    Minutes  15    METs  2.4      NuStep   Level  2    SPM  98    Minutes  20    METs  2.3      Home Exercise Plan   Plans to continue exercise at  Home (comment)    Frequency  Add 2 additional days to program exercise sessions.    Initial Home Exercises Provided  01/15/18       Social History   Tobacco Use  Smoking Status Light Tobacco Smoker  . Packs/day: 0.25  . Years: 38.00  . Pack years: 9.50  . Types: Cigarettes  Smokeless Tobacco Never Used  Tobacco Comment   using a patch    Goals Met:  Independence with exercise equipment Improved SOB with ADL's Using PLB without cueing & demonstrates good technique Exercise tolerated well No report of cardiac concerns or symptoms Strength training completed today  Goals Unmet:  Not Applicable  Comments: Check out 230   Dr. Sinda Du  is Market researcher for [employer].

## 2018-03-12 ENCOUNTER — Encounter (HOSPITAL_COMMUNITY)
Admission: RE | Admit: 2018-03-12 | Discharge: 2018-03-12 | Disposition: A | Discharge status: Home / Self Care | Payer: Medicare Other | Source: Ambulatory Visit | Attending: Cardiovascular Disease | Admitting: Cardiovascular Disease

## 2018-03-12 DIAGNOSIS — Z9989 Dependence on other enabling machines and devices: Secondary | ICD-10-CM | POA: Diagnosis not present

## 2018-03-12 DIAGNOSIS — J449 Chronic obstructive pulmonary disease, unspecified: Secondary | ICD-10-CM

## 2018-03-12 DIAGNOSIS — G4733 Obstructive sleep apnea (adult) (pediatric): Secondary | ICD-10-CM | POA: Diagnosis not present

## 2018-03-12 NOTE — Progress Notes (Signed)
Daily Session Note  Patient Details  Name: Ricardo Burch MRN: [id number] Date of Birth: [birth year] Referring Provider:     PULMONARY REHAB COPD ORIENTATION from 01/08/2018 in [place]  Referring Provider  DR. [name]      Encounter Date: 03/12/2018  Check In: Session Check In - 03/12/18 1330      Check-In   Location  [facility]    Staff Present  Diane Angelina Pih, MS, EP, [facility], Exercise Physiologist;Conny Situ Wynetta Emery, RN, BSN    Supervising physician immediately available to respond to emergencies  See telemetry face sheet for immediately available MD    Medication changes reported      No    Fall or balance concerns reported     No    Warm-up and Cool-down  Performed as group-led instruction    Resistance Training Performed  Yes    VAD Patient?  No      Pain Assessment   Currently in Pain?  No/denies    Pain Score  0-No pain    Multiple Pain Sites  No       Capillary Blood Glucose: No results found for this or any previous visit (from the past 24 hour(s)).    Social History   Tobacco Use  Smoking Status Light Tobacco Smoker  . Packs/day: 0.25  . Years: 38.00  . Pack years: 9.50  . Types: Cigarettes  Smokeless Tobacco Never Used  Tobacco Comment   using a patch    Goals Met:  Proper associated with RPD/PD & O2 Sat Independence with exercise equipment Improved SOB with ADL's Using PLB without cueing & demonstrates good technique Exercise tolerated well No report of cardiac concerns or symptoms Strength training completed today  Goals Unmet:  Not Applicable  Comments: Check out 1430.   Dr. Sinda Du is Medical Director for [facility] Pulmonary Rehab.

## 2018-03-13 ENCOUNTER — Encounter: Payer: Self-pay | Admitting: Pulmonary Disease

## 2018-03-13 ENCOUNTER — Ambulatory Visit (INDEPENDENT_AMBULATORY_CARE_PROVIDER_SITE_OTHER): Payer: Medicare Other | Admitting: Pulmonary Disease

## 2018-03-13 VITALS — BP 124/68 | HR 76 | Ht 67.0 in | Wt 245.0 lb

## 2018-03-13 DIAGNOSIS — G4733 Obstructive sleep apnea (adult) (pediatric): Secondary | ICD-10-CM

## 2018-03-13 DIAGNOSIS — J438 Other emphysema: Secondary | ICD-10-CM | POA: Diagnosis not present

## 2018-03-13 NOTE — Progress Notes (Addendum)
Ricardo Burch    [id number]    12/18/1960  Primary Care Physician:Patient, No Pcp Per  Referring Physician: No referring provider defined for this encounter.  Chief complaint:   Follow up for Severe COPD Severe OSA on autoset Active smoker  HPI: Ricardo Burch is a 57 year old active smoker, COPD. He's had multiple hospitalizations with COPD, CHF exacerbations in the past.He was hospitalized from 07/26/16-07/30/16 with acute pulmonary edema, acute exacerbation of COPD. He was intubated briefly for hypercarbia.   He has finished the pulmonary rehab and reports improved dyspnea. He has been on a rotating list of inhalers mainly due to his inability to pay co-pay. Ricardo Burch He has been started on CPAP after a repeat titration study and is tolerating it well.  Interim History: Stable since last clinic visit.  Continues on anoro and supplemental oxygen.  Currently enrolled on a rehab program at [facility] He quit smoking for 2 months but is now back to smoking again.  Chantix has been ordered by his primary care but he has not picked it up yet.  Outpatient Encounter Medications as of 03/13/2018  Medication Sig  . amLODipine (NORVASC) 5 MG tablet Take 1 tablet (5 mg total) by mouth daily.  Ricardo Burch ELLIPTA 62.5-25 MCG/INH AEPB Inhale 1 puff daily  . aspirin EC 81 MG tablet Take 1 tablet (81 mg total) by mouth daily.  Ricardo Burch DALIRESP 500 MCG TABS tablet Take 1 tablet (500 mcg total) by mouth daily.  . Guaifenesin (MUCINEX MAXIMUM STRENGTH) 1200 MG TB12 Take 1,200 mg by mouth daily.  . OXYGEN Inhale 2 L into the lungs continuous.  . sertraline (ZOLOFT) 25 MG tablet Take 1 tablet (25 mg total) by mouth daily.  . VENTOLIN HFA 108 (90 Base) MCG/ACT inhaler Inhale 1-2 puffs into the lungs every 6 (six) hours as needed for wheezing or shortness of breath. (200/12=17)  . [DISCONTINUED] azithromycin (ZITHROMAX) 250 MG tablet Take 1 tablet (250 mg total) by mouth daily.  . [DISCONTINUED] varenicline (CHANTIX  CONTINUING MONTH PAK) 1 MG tablet Take 1 tablet (1 mg total) by mouth daily.   No facility-administered encounter medications on file as of 03/13/2018.     Allergies as of 03/13/2018 - Review Complete 03/13/2018  Allergen Reaction Noted  . Apresoline [hydralazine] Other (See Comments) 07/10/2016    Past Medical History:  Diagnosis Date  . Allergy   . CHF (congestive heart failure) ([place])   . CHF (congestive heart failure) ([place])   . Chronic kidney disease   . COPD (chronic obstructive pulmonary disease) ([place]) Dx 2015  . Depression   . Eczema    since childhood   . Hyperlipidemia 01/12/2018  . Hypertension   . Oxygen deficiency   . Sleep apnea    CPAP  . Substance abuse ([place])    MJ, cocaine    Past Surgical History:  Procedure Laterality Date  . COLONOSCOPY WITH PROPOFOL N/A 06/25/2017   Procedure: COLONOSCOPY WITH PROPOFOL;  Surgeon: Daneil Dolin, MD;  [facility];  Service: Endoscopy;  Laterality: N/A;  2:00pm  . MULTIPLE EXTRACTIONS WITH ALVEOLOPLASTY N/A 03/22/2016   Procedure: MULTIPLE EXTRACTION WITH ALVEOLOPLASTY;  Surgeon: Diona Browner, DDS;  Location: [place];  Service: Oral Surgery;  Laterality: N/A;  . NO PAST SURGERIES    . POLYPECTOMY  06/25/2017   Procedure: POLYPECTOMY;  Surgeon: Daneil Dolin, MD;  [facility];  Service: Endoscopy;;  colon    Family History  Problem Relation Age of Onset  . Arthritis Mother   . Hypertension Mother   . Stroke Mother        age 80  . Cerebral aneurysm Father   . Alcohol abuse Father   . Cancer Brother   . Diabetes Neg Hx   . Heart disease Neg Hx     Social History   Socioeconomic History  . Marital status: Single    Spouse name: Not on file  . Number of children: 5  . Years of education: 23  . Highest education level: Not on file  Occupational History  . Occupation: Unemployed     Comment: car wash details  Social Needs  . Financial resource strain: Not on file  . Food insecurity:     Worry: Not on file    Inability: Not on file  . Transportation needs:    Medical: Not on file    Non-medical: Not on file  Tobacco Use  . Smoking status: Light Tobacco Smoker    Packs/day: 0.25    Years: 38.00    Pack years: 9.50    Types: Cigarettes  . Smokeless tobacco: Never Used  Substance and Sexual Activity  . Alcohol use: Yes    Alcohol/week: 0.6 oz    Types: 1 Cans of beer per week    Comment: occassionally: weekends, 6-pack or less on a day; or half pint of liquior  . Drug use: Yes    Types: Marijuana, Cocaine    Comment: not current  . Sexual activity: Never  Lifestyle  . Physical activity:    Days per week: Not on file    Minutes per session: Not on file  . Stress: Not on file  Relationships  . Social connections:    Talks on phone: Not on file    Gets together: Not on file    Attends religious service: Not on file    Active member of club or organization: Not on file    Attends meetings of clubs or organizations: Not on file    Relationship status: Not on file  . Intimate partner violence:    Fear of current or ex partner: Not on file    Emotionally abused: Not on file    Physically abused: Not on file    Forced sexual activity: Not on file  Other Topics Concern  . Not on file  Social History Narrative    Lives alone.    5 daughters 36, 78 yo twins, eldest 2 are married live in [place].    Works part time in a car wash    Review of systems: Review of Systems  Constitutional: Negative for fever and chills.  HENT: Negative.   Eyes: Negative for blurred vision.  Respiratory: as per HPI  Cardiovascular: Negative for chest pain and palpitations.  Gastrointestinal: Negative for vomiting, diarrhea, blood per rectum. Genitourinary: Negative for dysuria, urgency, frequency and hematuria.  Musculoskeletal: Negative for myalgias, back pain and joint pain.  Skin: Negative for itching and rash.  Neurological: Negative for dizziness, tremors, focal weakness,  seizures and loss of consciousness.  Endo/Heme/Allergies: Negative for environmental allergies.  Psychiatric/Behavioral: Negative for depression, suicidal ideas and hallucinations.  All other systems reviewed and are negative.  Physical Exam: Blood pressure 124/68, pulse 76, height 5\' 7"  (1.702 m), weight 245 lb (111.1 kg), SpO2 92 %. Gen:      No acute distress HEENT:  EOMI, sclera anicteric Neck:     No masses; no thyromegaly  Lungs:    Clear to auscultation bilaterally; normal respiratory effort CV:         Regular rate and rhythm; no murmurs Abd:      + bowel sounds; soft, non-tender; no palpable masses, no distension Ext:    No edema; adequate peripheral perfusion Skin:      Warm and dry; no rash Neuro: alert and oriented x 3 Psych: normal mood and affect  Data Reviewed: Chest x-ray 12/24/2016-mild perihilar infiltrate suggestive of pulmonary edema I have reviewed the images personally.  Echo (09/18/15) The right ventricular systolic pressure was increased consistent with moderate pulmonary hypertension.moderate concentric LVH. LVEF 60-65 percent. Grade 2 diastolic dysfunction. RV cavity size severe grade dilated. RV systolic function moderately to severely reduced. PA pressure 56  Sleep study 10/22/15  evere OSA, AHI 151. Started on an AutoSet CPAP titration study  Initiate CPAP at 12 with 2 L oxygen, fullface mask.  CPAP compliance report 06/23/16- 07/22/16 3% usage greater than 4 hours.  PFTs 01/16/16 FVC 2.43 [58%], FEV1 1.49 [45%), F/F 61, TLC 81%,DLCO 56% Severe obstruction, moderate reduction in diffusion capacity.  CBC 08/24/17-absolute eosinophil count 254  Assessment:  #1 COPD Has severe COPD and PFTs.  Symptoms are stable on anoro, Daliresp. Continue supplemental oxygen Currently on a exercise rehab program  #2 Severe OSA Stable on CPAP.  Continue with current setting of 12.  #3 Tobacco use Of cigarettes for 2 months but is currently back to smoking  again.  He is going to restart Chantix from his primary care and try to quit again   #4 Pulmonary hypertension Likely secondary to COPD, OSA.  We will continue to monitor.  #5 Health maintenance 09/15/2017-influenza 09/15/2017- Prevnar 13  Plan/Recommendations:0 - Continue Anoro, Daliresp, supplemental oxygen - Continue CPAP  Marshell Garfinkel MD [facility] 03/13/2018, 3:53 PM  CC: No ref. provider found

## 2018-03-13 NOTE — Patient Instructions (Signed)
I am glad you are doing well and have enrolled in an exercise program Please continue to work on smoking cessation.  Check with your primary care about the status of the Chantix Continue your inhalers, supplemental oxygen and CPAP. Follow-up in 6 months.

## 2018-03-16 ENCOUNTER — Telehealth: Payer: Self-pay | Admitting: Pulmonary Disease

## 2018-03-16 NOTE — Telephone Encounter (Signed)
ATC pt, no answer. Left message for pt to call back.  

## 2018-03-17 ENCOUNTER — Encounter (HOSPITAL_COMMUNITY)
Admission: RE | Admit: 2018-03-17 | Discharge: 2018-03-17 | Disposition: A | Discharge status: Home / Self Care | Payer: Medicare Other | Source: Ambulatory Visit | Attending: Cardiovascular Disease | Admitting: Cardiovascular Disease

## 2018-03-17 DIAGNOSIS — J449 Chronic obstructive pulmonary disease, unspecified: Secondary | ICD-10-CM

## 2018-03-17 DIAGNOSIS — G4733 Obstructive sleep apnea (adult) (pediatric): Secondary | ICD-10-CM | POA: Diagnosis not present

## 2018-03-17 DIAGNOSIS — Z9989 Dependence on other enabling machines and devices: Secondary | ICD-10-CM | POA: Diagnosis not present

## 2018-03-17 NOTE — Telephone Encounter (Signed)
Attempted to call pt. I did not receive an answer. I have left a message for pt to return our call.  

## 2018-03-17 NOTE — Progress Notes (Signed)
Daily Session Note  Patient Details  Name: Ricardo Burch MRN: [id number] Date of Birth: 11/19/1960 Referring Provider:     PULMONARY REHAB COPD ORIENTATION from 01/08/2018 in [place]  Referring Provider  DR. [name]      Encounter Date: 03/17/2018  Check In: Session Check In - 03/17/18 1421      Check-In   Location  [facility]    Staff Present  Diane Angelina Pih, MS, EP, [facility], Exercise Physiologist;Tinna Kolker Luther Parody, BS, EP, Exercise Physiologist    Supervising physician immediately available to respond to emergencies  See telemetry face sheet for immediately available MD    Medication changes reported      No    Fall or balance concerns reported     No    Tobacco Cessation  Use Decreased    Warm-up and Cool-down  Performed as group-led instruction    Resistance Training Performed  Yes    VAD Patient?  No      Pain Assessment   Currently in Pain?  No/denies    Pain Score  0-No pain    Multiple Pain Sites  No       Capillary Blood Glucose: No results found for this or any previous visit (from the past 24 hour(s)).    Social History   Tobacco Use  Smoking Status Light Tobacco Smoker  . Packs/day: 0.25  . Years: 38.00  . Pack years: 9.50  . Types: Cigarettes  Smokeless Tobacco Never Used    Goals Met:  Independence with exercise equipment Improved SOB with ADL's Using PLB without cueing & demonstrates good technique Exercise tolerated well No report of cardiac concerns or symptoms Strength training completed today  Goals Unmet:  Not Applicable  Comments: Check out 230   Dr. Sinda Du is Medical Director for [facility].

## 2018-03-18 MED ORDER — VARENICLINE TARTRATE 0.5 MG X 11 & 1 MG X 42 PO MISC: ORAL | 0 refills | Status: DC

## 2018-03-18 NOTE — Telephone Encounter (Signed)
Spoke with pt. States that he is needing a prescription for Chantix. His PCP is no longer with the office he was going to.  Dr. Vaughan Browner - please advise if you are okay with [place] sending in this prescription. Thanks.

## 2018-03-18 NOTE — Telephone Encounter (Signed)
Spoke with pt. He is aware that this prescription will be sent in. Rx has been sent in. Nothing further was needed.

## 2018-03-18 NOTE — Telephone Encounter (Signed)
Yes. OK to order chantix

## 2018-03-19 ENCOUNTER — Encounter (HOSPITAL_COMMUNITY): Payer: Medicare Other

## 2018-03-20 ENCOUNTER — Other Ambulatory Visit: Payer: Self-pay | Admitting: Pulmonary Disease

## 2018-03-20 ENCOUNTER — Telehealth: Payer: Self-pay | Admitting: Pulmonary Disease

## 2018-03-20 NOTE — Telephone Encounter (Signed)
PA initiated today on [email or web address] Key: [id number] - PA Case ID: [id number] - Rx #: [id number]

## 2018-03-23 NOTE — Telephone Encounter (Signed)
PA has been approved.  [facility] aware.  Nothing further needed.

## 2018-03-24 ENCOUNTER — Encounter (HOSPITAL_COMMUNITY)
Admission: RE | Admit: 2018-03-24 | Discharge: 2018-03-24 | Disposition: A | Discharge status: Home / Self Care | Payer: Medicare Other | Source: Ambulatory Visit | Attending: Cardiovascular Disease | Admitting: Cardiovascular Disease

## 2018-03-24 DIAGNOSIS — J449 Chronic obstructive pulmonary disease, unspecified: Secondary | ICD-10-CM

## 2018-03-24 DIAGNOSIS — G4733 Obstructive sleep apnea (adult) (pediatric): Secondary | ICD-10-CM | POA: Diagnosis not present

## 2018-03-24 DIAGNOSIS — Z9989 Dependence on other enabling machines and devices: Secondary | ICD-10-CM | POA: Diagnosis not present

## 2018-03-24 NOTE — Progress Notes (Signed)
Daily Session Note  Patient Details  Name: Ricardo Burch MRN: [id number] Date of Birth: 1961-01-17 Referring Provider:     PULMONARY REHAB COPD ORIENTATION from 01/08/2018 in [place]  Referring Provider  DR. [name]      Encounter Date: 03/24/2018  Check In: Session Check In - 03/24/18 1339      Check-In   Location  [facility]    Staff Present  Diane Angelina Pih, MS, EP, [facility], Exercise Physiologist;Offie Waide Luther Parody, BS, EP, Exercise Physiologist;Debra Wynetta Emery, RN, BSN    Supervising physician immediately available to respond to emergencies  See telemetry face sheet for immediately available MD    Medication changes reported      No    Fall or balance concerns reported     No    Tobacco Cessation  No Change    Warm-up and Cool-down  Performed as group-led instruction    Resistance Training Performed  Yes    VAD Patient?  No      Pain Assessment   Currently in Pain?  No/denies    Pain Score  0-No pain    Multiple Pain Sites  No       Capillary Blood Glucose: No results found for this or any previous visit (from the past 24 hour(s)).  Exercise Prescription Changes - 03/23/18 1500      Response to Exercise   Blood Pressure (Admit)  124/80    Blood Pressure (Exercise)  162/88    Blood Pressure (Exit)  116/70    Heart Rate (Admit)  98 bpm    Heart Rate (Exercise)  99 bpm    Heart Rate (Exit)  91 bpm    Oxygen Saturation (Admit)  92 %    Oxygen Saturation (Exercise)  91 %    Oxygen Saturation (Exit)  92 %    Rating of Perceived Exertion (Exercise)  10    Perceived Dyspnea (Exercise)  10    Duration  Progress to 30 minutes of  aerobic without signs/symptoms of physical distress    Intensity  THRR New [phone]      Progression   Progression  Continue to progress workloads to maintain intensity without signs/symptoms of physical distress.      Resistance Training   Training Prescription  Yes    Weight  4    Reps  10-15      Treadmill   MPH  1.8    Grade  0    Minutes  15    METs  2.4      NuStep   Level  2    SPM  92    Minutes  20    METs  1.8      Home Exercise Plan   Plans to continue exercise at  Home (comment)    Frequency  Add 2 additional days to program exercise sessions.    Initial Home Exercises Provided  01/15/18       Social History   Tobacco Use  Smoking Status Light Tobacco Smoker  . Packs/day: 0.25  . Years: 38.00  . Pack years: 9.50  . Types: Cigarettes  Smokeless Tobacco Never Used    Goals Met:  Independence with exercise equipment Improved SOB with ADL's Using PLB without cueing & demonstrates good technique Exercise tolerated well No report of cardiac concerns or symptoms Strength training completed today  Goals Unmet:  Not Applicable  Comments: Check out 230   Dr. Sinda Du is Medical Director for [facility].

## 2018-03-26 ENCOUNTER — Encounter (HOSPITAL_COMMUNITY)
Admission: RE | Admit: 2018-03-26 | Discharge: 2018-03-26 | Disposition: A | Discharge status: Home / Self Care | Payer: Medicare Other | Source: Ambulatory Visit | Attending: Cardiovascular Disease | Admitting: Cardiovascular Disease

## 2018-03-26 DIAGNOSIS — Z9989 Dependence on other enabling machines and devices: Secondary | ICD-10-CM | POA: Diagnosis not present

## 2018-03-26 DIAGNOSIS — G4733 Obstructive sleep apnea (adult) (pediatric): Secondary | ICD-10-CM | POA: Diagnosis not present

## 2018-03-26 DIAGNOSIS — J449 Chronic obstructive pulmonary disease, unspecified: Secondary | ICD-10-CM

## 2018-03-26 NOTE — Progress Notes (Signed)
Pulmonary Individual Treatment Plan  Patient Details  Name: Ricardo Burch MRN: [id number] Date of Birth: 02/05/1961 Referring Provider:     PULMONARY REHAB COPD ORIENTATION from 01/08/2018 in [facility]  Referring Provider  DR. Koneswaran      Initial Encounter Date:    PULMONARY REHAB COPD ORIENTATION from 01/08/2018 in [facility]  Date  01/08/18  Referring Provider  DR. Koneswaran      Visit Diagnosis: Chronic obstructive pulmonary disease, unspecified COPD type ([place])  Patient's Home Medications on Admission:   Current Outpatient Medications:  .  amLODipine (NORVASC) 5 MG tablet, Take 1 tablet (5 mg total) by mouth daily., Disp: 90 tablet, Rfl: 3 .  ANORO ELLIPTA 62.5-25 MCG/INH AEPB, INHALE 1 PUFF INTO THE LUNGS DAILY., Disp: 60 each, Rfl: 0 .  aspirin EC 81 MG tablet, Take 1 tablet (81 mg total) by mouth daily., Disp: 30 tablet, Rfl: 0 .  DALIRESP 500 MCG TABS tablet, Take 1 tablet (500 mcg total) by mouth daily., Disp: 30 tablet, Rfl: 5 .  Guaifenesin (MUCINEX MAXIMUM STRENGTH) 1200 MG TB12, Take 1,200 mg by mouth daily., Disp: , Rfl:  .  OXYGEN, Inhale 2 L into the lungs continuous., Disp: , Rfl:  .  sertraline (ZOLOFT) 25 MG tablet, Take 1 tablet (25 mg total) by mouth daily., Disp: 30 tablet, Rfl: 2 .  varenicline (CHANTIX STARTING MONTH PAK) 0.5 MG X 11 & 1 MG X 42 tablet, Take 1 0.5 mg tablet daily for 3 days, then increase to 1 0.5 mg tablet twice daily for 4 days, then increase to 1 1 mg tablet twice daily., Disp: 53 tablet, Rfl: 0 .  VENTOLIN HFA 108 (90 Base) MCG/ACT inhaler, Inhale 1-2 puffs into the lungs every 6 (six) hours as needed for wheezing or shortness of breath. (200/12=17), Disp: 18 g, Rfl: 2  Past Medical History: Past Medical History:  Diagnosis Date  . Allergy   . CHF (congestive heart failure) ([place])   . CHF (congestive heart failure) ([place])   . Chronic kidney disease   . COPD (chronic obstructive pulmonary  disease) ([place]) Dx 2015  . Depression   . Eczema    since childhood   . Hyperlipidemia 01/12/2018  . Hypertension   . Oxygen deficiency   . Sleep apnea    CPAP  . Substance abuse ([place])    MJ, cocaine    Tobacco Use: Social History   Tobacco Use  Smoking Status Light Tobacco Smoker  . Packs/day: 0.25  . Years: 38.00  . Pack years: 9.50  . Types: Cigarettes  Smokeless Tobacco Never Used    Labs: Recent Review Flowsheet Data    Labs for ITP Cardiac and Pulmonary Rehab Latest Ref Rng & Units 09/17/2015 07/26/2016 07/27/2016 07/27/2016 10/14/2017   Cholestrol <200 mg/dL - - - - 136   LDLCALC mg/dL (calc) - - - - 60   HDL >40 mg/dL - - - - 60   Trlycerides <150 mg/dL - - - - 82   Hemoglobin A1c <5.7 % of total Hgb - - - - 5.3   PHART 7.350 - 7.450 7.274(L) - 7.184(LL) 7.341(L) -   PCO2ART 32.0 - 48.0 mmHg 81.2(HH) - 0.0(LL) 84.2(HH) -   HCO3 20.0 - 28.0 mmol/L 36.7(H) - 47.0(H) 44.3(H) -   TCO2 0 - 100 mmol/L 32.2 44 51.0 - -   O2SAT % 90.5 - 82.8 92.0 -      Capillary Blood Glucose: Lab Results  Component  Value Date   GLUCAP 179 (H) 07/27/2016   GLUCAP 156 (H) 07/27/2016   GLUCAP 140 (H) 09/16/2015     Pulmonary Assessment Scores: Pulmonary Assessment Scores    Row Name 01/08/18 1116         ADL UCSD   ADL Phase  Entry     SOB Score total  31     Rest  0     Walk  3     Stairs  4     Bath  1     Dress  0     Shop  1       CAT Score   CAT Score  23       mMRC Score   mMRC Score  2        Pulmonary Function Assessment: Pulmonary Function Assessment - 01/08/18 1112      Pulmonary Function Tests   FVC%  58 %    FEV1%  45 %    FEV1/FVC Ratio  76    RV%  142 %    DLCO%  56 %      Post Bronchodilator Spirometry Results   FVC%  2.72 %    FEV1%  1.65 %    FEV1/FVC Ratio  61      Breath   Bilateral Breath Sounds  Clear    Shortness of Breath  Yes       Exercise Target Goals:    Exercise Program Goal: Individual exercise prescription set  using results from initial 6 min walk test and THRR while considering  patient's activity barriers and safety.   Exercise Prescription Goal: Initial exercise prescription builds to 30-45 minutes a day of aerobic activity, 2-3 days per week.  Home exercise guidelines will be given to patient during program as part of exercise prescription that the participant will acknowledge.  Activity Barriers & Risk Stratification: Activity Barriers & Cardiac Risk Stratification - 01/08/18 0945      Activity Barriers & Cardiac Risk Stratification   Activity Barriers  Shortness of Breath    Cardiac Risk Stratification  Moderate       6 Minute Walk: 6 Minute Walk    Row Name 01/08/18 0943         6 Minute Walk   Phase  Initial     Distance  1100 feet     Distance % Change  0 %     Distance Feet Change  0 ft     Walk Time  6 minutes     # of Rest Breaks  0     MPH  2.08     METS  2.59     RPE  11     Perceived Dyspnea   14     VO2 Peak  10.63     Symptoms  No     Resting HR  91 bpm     Resting BP  124/90     Resting Oxygen Saturation   92 %     Exercise Oxygen Saturation  during 6 min walk  82 %     Max Ex. HR  112 bpm     Max Ex. BP  154/96     2 Minute Post BP  126/88        Oxygen Initial Assessment: Oxygen Initial Assessment - 01/08/18 1032      Home Oxygen   Home Oxygen Device  E-Tanks;Home Concentrator    Sleep Oxygen Prescription  CPAP  Liters per minute  2    Home Exercise Oxygen Prescription  None    Home at Rest Exercise Oxygen Prescription  Continuous    Liters per minute  2    Compliance with Home Oxygen Use  Yes      Initial 6 min Walk   Oxygen Used  Continuous    Liters per minute  4      Program Oxygen Prescription   Program Oxygen Prescription  Continuous;E-Tanks    Liters per minute  2      Intervention   Short Term Goals  To learn and demonstrate proper pursed lip breathing techniques or other breathing techniques.;To learn and understand importance  of maintaining oxygen saturations>88%    Long  Term Goals  Exhibits proper breathing techniques, such as pursed lip breathing or other method taught during program session;Maintenance of O2 saturations>88%       Oxygen Re-Evaluation: Oxygen Re-Evaluation    Row Name 02/05/18 1405 03/05/18 1452 03/26/18 1546         Program Oxygen Prescription   Program Oxygen Prescription  Continuous;E-Tanks  Continuous;E-Tanks  Continuous     Liters per minute  2  4  4      FiO2%  97  90  91       Home Oxygen   Home Oxygen Device  E-Tanks;Home Concentrator  E-Tanks;Home Concentrator  E-Tanks;Home Concentrator     Sleep Oxygen Prescription  CPAP  CPAP  CPAP     Liters per minute  2  2  2      Home Exercise Oxygen Prescription  None  None  None     Home at Rest Exercise Oxygen Prescription  None  None  None     Compliance with Home Oxygen Use  Yes  Yes  Yes       Goals/Expected Outcomes   Short Term Goals  To learn and demonstrate proper pursed lip breathing techniques or other breathing techniques.;To learn and understand importance of maintaining oxygen saturations>88%  To learn and demonstrate proper pursed lip breathing techniques or other breathing techniques.;To learn and understand importance of maintaining oxygen saturations>88%  To learn and demonstrate proper pursed lip breathing techniques or other breathing techniques.;To learn and understand importance of maintaining oxygen saturations>88%     Long  Term Goals  Exhibits proper breathing techniques, such as pursed lip breathing or other method taught during program session;Maintenance of O2 saturations>88%  Exhibits proper breathing techniques, such as pursed lip breathing or other method taught during program session;Maintenance of O2 saturations>88%  Exhibits proper breathing techniques, such as pursed lip breathing or other method taught during program session;Maintenance of O2 saturations>88%     Comments  -  Patient demonstrates proper  pursed lip breathing techniques during exercise and also demonstrates proper usage of pulse oximeter. He is able to verbalize the importance of maintaining his O2 saturations >88%. Will continue to monitor.   Patient demonstrates proper pursed lip breathing techniques during exercise and also demonstrates proper usage of pulse oximeter. He is able to verbalize the importance of maintaining his O2 saturations >88%. Will continue to monitor.      Goals/Expected Outcomes  -  Patient will continue to meet both his short and long term goals.   Patient will continue to meet both his short and long term goals.         Oxygen Discharge (Final Oxygen Re-Evaluation): Oxygen Re-Evaluation - 03/26/18 1546      Program Oxygen Prescription  Program Oxygen Prescription  Continuous    Liters per minute  4    FiO2%  91      Home Oxygen   Home Oxygen Device  E-Tanks;Home Concentrator    Sleep Oxygen Prescription  CPAP    Liters per minute  2    Home Exercise Oxygen Prescription  None    Home at Rest Exercise Oxygen Prescription  None    Compliance with Home Oxygen Use  Yes      Goals/Expected Outcomes   Short Term Goals  To learn and demonstrate proper pursed lip breathing techniques or other breathing techniques.;To learn and understand importance of maintaining oxygen saturations>88%    Long  Term Goals  Exhibits proper breathing techniques, such as pursed lip breathing or other method taught during program session;Maintenance of O2 saturations>88%    Comments  Patient demonstrates proper pursed lip breathing techniques during exercise and also demonstrates proper usage of pulse oximeter. He is able to verbalize the importance of maintaining his O2 saturations >88%. Will continue to monitor.     Goals/Expected Outcomes  Patient will continue to meet both his short and long term goals.        Initial Exercise Prescription: Initial Exercise Prescription - 01/08/18 0900      Date of Initial Exercise  RX and Referring Provider   Date  01/08/18    Referring Provider  DR. Koneswaran      Treadmill   MPH  1.3    Grade  0    Minutes  15    METs  1.9      NuStep   Level  2    SPM  55    Minutes  20    METs  1.8      Prescription Details   Frequency (times per week)  3    Duration  Progress to 30 minutes of continuous aerobic without signs/symptoms of physical distress      Intensity   THRR 40-80% of Max Heartrate  [phone]    Ratings of Perceived Exertion  11-13    Perceived Dyspnea  0-4      Progression   Progression  Continue progressive overload as per policy without signs/symptoms or physical distress.      Resistance Training   Training Prescription  Yes    Weight  1    Reps  10-15       Perform Capillary Blood Glucose checks as needed.  Exercise Prescription Changes:  Exercise Prescription Changes    Row Name 01/15/18 1400 01/22/18 0700 02/02/18 1400 02/26/18 0800 03/10/18 1000     Response to Exercise   Blood Pressure (Admit)  -  126/72  118/72  120/74  112/72   Blood Pressure (Exercise)  -  142/90  128/76  144/70  126/70   Blood Pressure (Exit)  -  126/72  114/66  120/74  112/72   Heart Rate (Admit)  -  106 bpm  96 bpm  92 bpm  102 bpm   Heart Rate (Exercise)  -  114 bpm  106 bpm  113 bpm  107 bpm   Heart Rate (Exit)  -  92 bpm  95 bpm  90 bpm  91 bpm   Oxygen Saturation (Admit)  -  93 %  92 %  93 %  91 %   Oxygen Saturation (Exercise)  -  95 %  91 %  91 %  90 %   Oxygen Saturation (Exit)  -  91 %  90 %  96 %  92 %   Rating of Perceived Exertion (Exercise)  -  11  11  11  11    Perceived Dyspnea (Exercise)  -  11  11  11  11    Duration  -  Progress to 30 minutes of  aerobic without signs/symptoms of physical distress  Progress to 30 minutes of  aerobic without signs/symptoms of physical distress  Progress to 30 minutes of  aerobic without signs/symptoms of physical distress  Progress to 30 minutes of  aerobic without signs/symptoms of physical distress    Intensity  -  THRR New [phone]  THRR New 124-138-152  THRR New 122-136-151  THRR New [phone]     Progression   Progression  -  Continue to progress workloads to maintain intensity without signs/symptoms of physical distress.  Continue to progress workloads to maintain intensity without signs/symptoms of physical distress.  Continue to progress workloads to maintain intensity without signs/symptoms of physical distress.  Continue to progress workloads to maintain intensity without signs/symptoms of physical distress.     Resistance Training   Training Prescription  Yes  Yes  Yes  Yes  Yes   Weight  1  2  2  4  4    Reps  10-15  10-15  10-15  10-15  10-15     Treadmill   MPH  1.3  1.4  1.4  1.7  1.8   Grade  0  0  0  0  0   Minutes  15  15  15  15  15    METs  1.9  2.07  2.07  2.3  2.4     NuStep   Level  2  2  2  2  2    SPM  55  80  87  93  98   Minutes  20  20  20  20  20    METs  1.8  1.9  1.9  2  2.3     Home Exercise Plan   Plans to continue exercise at  Home (comment)  Home (comment)  Home (comment)  Home (comment)  Home (comment)   Frequency  Add 2 additional days to program exercise sessions.  Add 2 additional days to program exercise sessions.  Add 2 additional days to program exercise sessions.  Add 2 additional days to program exercise sessions.  Add 2 additional days to program exercise sessions.   Initial Home Exercises Provided  01/15/18  01/15/18  01/15/18  01/15/18  01/15/18   Row Name 03/23/18 1500             Response to Exercise   Blood Pressure (Admit)  124/80       Blood Pressure (Exercise)  162/88       Blood Pressure (Exit)  116/70       Heart Rate (Admit)  98 bpm       Heart Rate (Exercise)  99 bpm       Heart Rate (Exit)  91 bpm       Oxygen Saturation (Admit)  92 %       Oxygen Saturation (Exercise)  91 %       Oxygen Saturation (Exit)  92 %       Rating of Perceived Exertion (Exercise)  10       Perceived Dyspnea (Exercise)  10       Duration   Progress to 30 minutes of  aerobic without  signs/symptoms of physical distress       Intensity  THRR New [phone]         Progression   Progression  Continue to progress workloads to maintain intensity without signs/symptoms of physical distress.         Resistance Training   Training Prescription  Yes       Weight  4       Reps  10-15         Treadmill   MPH  1.8       Grade  0       Minutes  15       METs  2.4         NuStep   Level  2       SPM  92       Minutes  20       METs  1.8         Home Exercise Plan   Plans to continue exercise at  Home (comment)       Frequency  Add 2 additional days to program exercise sessions.       Initial Home Exercises Provided  01/15/18          Exercise Comments:  Exercise Comments    Row Name 01/15/18 1408 01/22/18 0758 02/02/18 1413 02/26/18 0813 03/10/18 1024   Exercise Comments  Patient has received the take home exercise plan today 01/15/2018. THR was addressed as were safety guidelines for being active when not here in [place]. Patient demonstrated an understanding and was encouraged to ask any future questions.   Patient is doing well in PR for just starting. Patient has increased his SPMs on the nustep and has increased his speed on th etreadmill to 1.4. Patients progression will continued to be monitored throughout the remainder of the program.   Patient continues to do well in PR and has maintained his level on both the treadmill as well as thet nustep machine. Patient has also increased his SPMs on the nustep machine. Patient has still only just started the program and will be progressed more in time.   Patient continues to do well in PR. Patient has increased his speed on the treadmill as well as increasing his SPMs on the nustep machine. Patients O2 levels have been staying above 90 since he is attatched to the O2 here at [place]. Patient seems to be enjoying the program along with his classmates and we will continue to monitor the patient  throughout the remainder of the program.   Patient continues to do well in PR. Patient has progressed on the treadmill to 1.73mph. Patient has also maintained his level on the nustep machine. 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 Patient states that he has also felt an increase in strength from PR.    [place] Name 03/23/18 1517           Exercise Comments  Patient continues to do well in PR. patient has maintained his levels and speed on both of his machines. Patient has admitted to only smoking 1-2 cigarettes a day and has also stated that he has lost weight and is breathing better now since he started the priogram. Patient wishes to continue down this path.           Exercise Goals and Review:  Exercise Goals    Row  Name 01/08/18 0947             Exercise Goals   Increase Physical Activity  Yes       Intervention  Provide advice, education, support and counseling about physical activity/exercise needs.;Develop an individualized exercise prescription for aerobic and resistive training based on initial evaluation findings, risk stratification, comorbidities and participant's personal goals.       Expected Outcomes  Short Term: Attend rehab on a regular basis to increase amount of physical activity.       Increase Strength and Stamina  Yes       Intervention  Provide advice, education, support and counseling about physical activity/exercise needs.;Develop an individualized exercise prescription for aerobic and resistive training based on initial evaluation findings, risk stratification, comorbidities and participant's personal goals.       Expected Outcomes  Short Term: Increase workloads from initial exercise prescription for resistance, speed, and METs.       Able to understand and use rate of perceived exertion (RPE) scale  Yes       Intervention  Provide education and explanation on how to use RPE scale        Expected Outcomes  Short Term: Able to use RPE daily in rehab to express subjective intensity level;Long Term:  Able to use RPE to guide intensity level when exercising independently       Able to understand and use Dyspnea scale  Yes       Intervention  Provide education and explanation on how to use Dyspnea scale       Expected Outcomes  Short Term: Able to use Dyspnea scale daily in rehab to express subjective sense of shortness of breath during exertion;Long Term: Able to use Dyspnea scale to guide intensity level when exercising independently       Knowledge and understanding of Target Heart Rate Range (THRR)  Yes       Intervention  Provide education and explanation of THRR including how the numbers were predicted and where they are located for reference       Expected Outcomes  Short Term: Able to state/look up THRR;Long Term: Able to use THRR to govern intensity when exercising independently;Short Term: Able to use daily as guideline for intensity in rehab       Able to check pulse independently  Yes       Intervention  Provide education and demonstration on how to check pulse in carotid and radial arteries.;Review the importance of being able to check your own pulse for safety during independent exercise       Expected Outcomes  Short Term: Able to explain why pulse checking is important during independent exercise;Long Term: Able to check pulse independently and accurately       Understanding of Exercise Prescription  Yes       Intervention  Provide education, explanation, and written materials on patient's individual exercise prescription       Expected Outcomes  Short Term: Able to explain program exercise prescription;Long Term: Able to explain home exercise prescription to exercise independently          Exercise Goals Re-Evaluation : Exercise Goals Re-Evaluation    Row Name 02/02/18 1412 03/02/18 1457 03/23/18 1516         Exercise Goal Re-Evaluation   Exercise Goals Review   Increase Physical Activity;Increase Strength and Stamina;Able to understand and use rate of perceived exertion (RPE) scale;Able to check pulse independently;Knowledge and understanding of Target Heart Rate Range (  THRR);Able to understand and use Dyspnea scale;Understanding of Exercise Prescription  Increase Physical Activity;Increase Strength and Stamina;Able to understand and use rate of perceived exertion (RPE) scale;Able to check pulse independently;Knowledge and understanding of Target Heart Rate Range (THRR);Able to understand and use Dyspnea scale;Understanding of Exercise Prescription  Increase Physical Activity;Increase Strength and Stamina;Able to understand and use rate of perceived exertion (RPE) scale;Able to check pulse independently;Knowledge and understanding of Target Heart Rate Range (THRR);Able to understand and use Dyspnea scale;Understanding of Exercise Prescription     Comments  Patient continues to do well in PR and has maintained his level on both the treadmill as well as thet nustep machine. Patient has also increased his SPMs on the nustep machine. Patient has still only just started the program and will be progressed more in time.   Patient is doing very well in PR. Patient has increased his speed on the treadmill to 1.7 and as well as maintaining his level and SPMs on the nustep machine. Patient has also lost weight since beginning the program. 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   Patient continues to do well in PR. patient has maintained his levels and speed on both of his machines. Patient has admitted to only smoking 1-2 cigarettes a day and has also stated that he has lost weight and is breathing better now since he started the priogram. Patient wishes to continue down this path.      Expected Outcomes  Patient wishes to breathe better and to lose weight.   Patient wishes to breathe better and to lose weight.   Patient  wishes to breathe better and to lose weight.         Discharge Exercise Prescription (Final Exercise Prescription Changes): Exercise Prescription Changes - 03/23/18 1500      Response to Exercise   Blood Pressure (Admit)  124/80    Blood Pressure (Exercise)  162/88    Blood Pressure (Exit)  116/70    Heart Rate (Admit)  98 bpm    Heart Rate (Exercise)  99 bpm    Heart Rate (Exit)  91 bpm    Oxygen Saturation (Admit)  92 %    Oxygen Saturation (Exercise)  91 %    Oxygen Saturation (Exit)  92 %    Rating of Perceived Exertion (Exercise)  10    Perceived Dyspnea (Exercise)  10    Duration  Progress to 30 minutes of  aerobic without signs/symptoms of physical distress    Intensity  THRR New [phone]      Progression   Progression  Continue to progress workloads to maintain intensity without signs/symptoms of physical distress.      Resistance Training   Training Prescription  Yes    Weight  4    Reps  10-15      Treadmill   MPH  1.8    Grade  0    Minutes  15    METs  2.4      NuStep   Level  2    SPM  92    Minutes  20    METs  1.8      Home Exercise Plan   Plans to continue exercise at  Home (comment)    Frequency  Add 2 additional days to program exercise sessions.    Initial Home Exercises Provided  01/15/18       Nutrition:  Target  Goals: Understanding of nutrition guidelines, daily intake of sodium 1500mg , cholesterol 200mg , calories 30% from fat and 7% or less from saturated fats, daily to have 5 or more servings of fruits and vegetables.  Biometrics: Pre Biometrics - 01/08/18 0947      Pre Biometrics   Height  5\' 7"  (1.702 m)    Weight  252 lb 6.4 oz (114.5 kg)    Waist Circumference  44.5 inches    Hip Circumference  42.5 inches    Waist to Hip Ratio  1.05 %    BMI (Calculated)  39.52    Triceps Skinfold  10 mm    % Body Fat  32.2 %    Grip Strength  75.06 kg    Flexibility  0 in    Single Leg Stand  3.5 seconds        Nutrition Therapy  Plan and Nutrition Goals: Nutrition Therapy & Goals - 03/26/18 1546      Nutrition Therapy   RD appointment deferred  Yes      Personal Nutrition Goals   Personal Goal #2  Patient continues to work on eating more heart healthy. He is working with a dietician to lose weight.     Additional Goals?  No      Intervention Plan   Intervention  Nutrition handout(s) given to patient.    Expected Outcomes  Short Term Goal: A plan has been developed with personal nutrition goals set during dietitian appointment.;Long Term Goal: Adherence to prescribed nutrition plan.       Nutrition Assessments: Nutrition Assessments - 01/08/18 1128      MEDFICTS Scores   Pre Score  59       Nutrition Goals Re-Evaluation:   Nutrition Goals Discharge (Final Nutrition Goals Re-Evaluation):   Psychosocial: Target Goals: Acknowledge presence or absence of significant depression and/or stress, maximize coping skills, provide positive support system. Participant is able to verbalize types and ability to use techniques and skills needed for reducing stress and depression.  Initial Review & Psychosocial Screening: Initial Psych Review & Screening - 01/08/18 1132      Initial Review   Current issues with  None Identified      Family Dynamics   Concerns  No support system      Barriers   Psychosocial barriers to participate in program  There are no identifiable barriers or psychosocial needs.      Screening Interventions   Interventions  Encouraged to exercise    Expected Outcomes  Short Term goal: Identification and review with participant of any Quality of Life or Depression concerns found by scoring the questionnaire.;Long Term goal: The participant improves quality of Life and PHQ9 Scores as seen by post scores and/or verbalization of changes       Quality of Life Scores: Quality of Life - 01/08/18 1002      Quality of Life Scores   Health/Function Pre  9.23 %    Socioeconomic Pre  14.17 %     Psych/Spiritual Pre  18.79 %    Family Pre  13.5 %    GLOBAL Pre  12.8 %      Scores of 19 and below usually indicate a poorer quality of life in these areas.  A difference of  2-3 points is a clinically meaningful difference.  A difference of 2-3 points in the total score of the Quality of Life Index has been associated with significant improvement in overall quality of life, self-image, physical symptoms, and  general health in studies assessing change in quality of life.   PHQ-9: Recent Review Flowsheet Data    Depression screen [facility] 2/9 02/05/2018 01/08/2018 12/01/2017 09/15/2017 01/02/2017   Decreased Interest 0 0 0 0 0   Down, Depressed, Hopeless 0 1 0 0 0   PHQ - 2 Score 0 1 0 0 0   Altered sleeping - 0 - - 0   Tired, decreased energy - 1 - - 0   Change in appetite - 0 - - 0   Feeling bad or failure about yourself  - 0 - - 0   Trouble concentrating - 0 - - 0   Moving slowly or fidgety/restless - 0 - - 0   Suicidal thoughts - 0 - - 0   PHQ-9 Score - 2 - - 0   Difficult doing work/chores - Somewhat difficult - - -     Interpretation of Total Score  Total Score Depression Severity:  1-4 = Minimal depression, 5-9 = Mild depression, 10-14 = Moderate depression, 15-19 = Moderately severe depression, 20-27 = Severe depression   Psychosocial Evaluation and Intervention: Psychosocial Evaluation - 01/08/18 1134      Psychosocial Evaluation & Interventions   Interventions  Encouraged to exercise with the program and follow exercise prescription    Comments  Patients QOL scores where low. We discussed his scores and he said his biggest problems is due to SOB he can't do the activities that he used to do and that is what is bothering him.     Continue Psychosocial Services   Follow up required by staff       Psychosocial Re-Evaluation: Psychosocial Re-Evaluation    [place] Name 02/05/18 1423 03/05/18 1500 03/26/18 1551         Psychosocial Re-Evaluation   Current issues with  None  Identified  None Identified  None Identified     Comments  Patient's initial QOL was 12.80 and his PHQ-9 score was 2 indicating depression. He says he is not depressed. He scored losest on health/function and family. He does not have a good family support system.   Patient's initial QOL was 12.80 and his PHQ-9 score was 2 indicating depression. He says he is not depressed. He scored losest on health/function and family. He does not have a good family support system.   Patient's initial QOL was 12.80 and his PHQ-9 score was 2 indicating depression. He says he is not depressed. He scored losest on health/function and family. He does not have a good family support system.      Expected Outcomes  Patient's QOL and PHQ-9 score will improve at discharge and he will have no additional psychosocial issues identified.   Patient's QOL and PHQ-9 score will improve at discharge and he will have no additional psychosocial issues identified.   Patient's QOL and PHQ-9 score will improve at discharge and he will have no additional psychosocial issues identified.      Interventions  Stress management education;Relaxation education;Encouraged to attend Pulmonary Rehabilitation for the exercise  Stress management education;Relaxation education;Encouraged to attend Pulmonary Rehabilitation for the exercise  Stress management education;Relaxation education;Encouraged to attend Pulmonary Rehabilitation for the exercise     Continue Psychosocial Services   No Follow up required  No Follow up required  No Follow up required        Psychosocial Discharge (Final Psychosocial Re-Evaluation): Psychosocial Re-Evaluation - 03/26/18 1551      Psychosocial Re-Evaluation   Current issues with  None Identified  Comments  Patient's initial QOL was 12.80 and his PHQ-9 score was 2 indicating depression. He says he is not depressed. He scored losest on health/function and family. He does not have a good family support system.      Expected Outcomes  Patient's QOL and PHQ-9 score will improve at discharge and he will have no additional psychosocial issues identified.     Interventions  Stress management education;Relaxation education;Encouraged to attend Pulmonary Rehabilitation for the exercise    Continue Psychosocial Services   No Follow up required        Education: Education Goals: Education classes will be provided on a weekly basis, covering required topics. Participant will state understanding/return demonstration of topics presented.  Learning Barriers/Preferences: Learning Barriers/Preferences - 01/08/18 1031      Learning Barriers/Preferences   Learning Barriers  None    Learning Preferences  Video;Written Material;Pictoral       Education Topics: How Lungs Work and Diseases: - Discuss the anatomy of the lungs and diseases that can affect the lungs, such as COPD.   PULMONARY REHAB CHRONIC OBSTRUCTIVE PULMONARY DISEASE from 03/26/2018 in [place]  Date  03/12/18  Educator  D. Coad  Instruction Review Code  2- Demonstrated Understanding      Exercise: -Discuss the importance of exercise, FITT principles of exercise, normal and abnormal responses to exercise, and how to exercise safely.   PULMONARY REHAB CHRONIC OBSTRUCTIVE PULMONARY DISEASE from 03/26/2018 in [place]  Date  03/05/18  Educator  [place]  Instruction Review Code  2- Demonstrated Understanding      Environmental Irritants: -Discuss types of environmental irritants and how to limit exposure to environmental irritants.   Meds/Inhalers and oxygen: - Discuss respiratory medications, definition of an inhaler and oxygen, and the proper way to use an inhaler and oxygen.   PULMONARY REHAB CHRONIC OBSTRUCTIVE PULMONARY DISEASE from 03/26/2018 in [place]  Date  03/26/18  Educator  DC      Energy Saving Techniques: - Discuss methods to conserve energy and decrease  shortness of breath when performing activities of daily living.    Bronchial Hygiene / Breathing Techniques: - Discuss breathing mechanics, pursed-lip breathing technique,  proper posture, effective ways to clear airways, and other functional breathing techniques   Cleaning Equipment: - Provides group verbal and written instruction about the health risks of elevated stress, cause of high stress, and healthy ways to reduce stress.   PULMONARY REHAB CHRONIC OBSTRUCTIVE PULMONARY DISEASE from 03/26/2018 in [place]  Date  01/15/18  Educator  [place]  Instruction Review Code  2- Demonstrated Understanding      Nutrition I: Fats: - Discuss the types of cholesterol, what cholesterol does to the body, and how cholesterol levels can be controlled.   PULMONARY REHAB CHRONIC OBSTRUCTIVE PULMONARY DISEASE from 03/26/2018 in [place]  Date  01/22/18  Educator  [place]  Instruction Review Code  2- Demonstrated Understanding      Nutrition II: Labels: -Discuss the different components of food labels and how to read food labels.   PULMONARY REHAB CHRONIC OBSTRUCTIVE PULMONARY DISEASE from 03/26/2018 in [place]  Date  01/29/18  Educator  DJ  Instruction Review Code  2- Demonstrated Understanding      Respiratory Infections: - Discuss the signs and symptoms of respiratory infections, ways to prevent respiratory infections, and the importance of seeking medical treatment when having a respiratory infection.   PULMONARY REHAB CHRONIC OBSTRUCTIVE  PULMONARY DISEASE from 03/26/2018 in [place]  Date  02/05/18  Educator  [place]  Instruction Review Code  2- Demonstrated Understanding      Stress I: Signs and Symptoms: - Discuss the causes of stress, how stress may lead to anxiety and depression, and ways to limit stress.   Stress II: Relaxation: -Discuss relaxation techniques to limit stress.   PULMONARY REHAB  CHRONIC OBSTRUCTIVE PULMONARY DISEASE from 03/26/2018 in [place]  Date  02/19/18  Educator  [place]  Instruction Review Code  2- Demonstrated Understanding      Oxygen for Home/Travel: - Discuss how to prepare for travel when on oxygen and proper ways to transport and store oxygen to ensure safety.   Knowledge Questionnaire Score: Knowledge Questionnaire Score - 01/08/18 1110      Knowledge Questionnaire Score   Pre Score  15/18       Core Components/Risk Factors/Patient Goals at Admission: Personal Goals and Risk Factors at Admission - 01/08/18 1128      Core Components/Risk Factors/Patient Goals on Admission    Weight Management  Yes    Intervention  Weight Management/Obesity: Establish reasonable short term and long term weight goals.;Obesity: Provide education and appropriate resources to help participant work on and attain dietary goals.    Admit Weight  252 lb 6.4 oz (114.5 kg)    Goal Weight: Short Term  244 lb 14.4 oz (111.1 kg)    Goal Weight: Long Term  237 lb 6.4 oz (107.7 kg)    Expected Outcomes  Short Term: Continue to assess and modify interventions until short term weight is achieved;Long Term: Adherence to nutrition and physical activity/exercise program aimed toward attainment of established weight goal    Improve shortness of breath with ADL's  Yes    Intervention  Provide education, individualized exercise plan and daily activity instruction to help decrease symptoms of SOB with activities of daily living.    Expected Outcomes  Short Term: Improve cardiorespiratory fitness to achieve a reduction of symptoms when performing ADLs;Long Term: Be able to perform more ADLs without symptoms or delay the onset of symptoms    Personal Goal Other  Yes    Personal Goal  Breathe better, Lose 15lbs while in program    Intervention  Attend class 2 x week and supplement with home exercise 3 x week    Expected Outcomes  Reach personal goals       Core  Components/Risk Factors/Patient Goals Review:  Goals and Risk Factor Review    Row Name 02/05/18 1408 03/05/18 1455 03/26/18 1547         Core Components/Risk Factors/Patient Goals Review   Personal Goals Review  Weight Management/Obesity;Improve shortness of breath with ADL's;Diabetes Breathe better; lose weight 15 lbs; do regular ADL's.   Weight Management/Obesity;Improve shortness of breath with ADL's;Diabetes Breathe better; lose weight 15 lbs; do regular ADL's.   Weight Management/Obesity;Improve shortness of breath with ADL's;Diabetes;Tobacco Cessation Breathe better; lose weight 15 lbs; do regular ADL's.     Review  Patient has completed 7 sessions losing 6.1 lbs. He is doing well in the program with some progression. He says it is too soon to tell a difference yet in the program but hopes as he continues the program he will meet his personal goals.   Patient has completed 13 sessions losing 4.5 lb overall. He continues to do well in the program with progression. His last A1C in [facility] was 6.2 10/14/17. He says the program  is helping him a lot. He is able to do more things now without fatigue. He is able to work his job and go home and do things around the house. He has more strength and energy. Will continue to monitor for progress.   Patient has completed 18 sessions losing 5.4 lbs since he started the program. He continues to do well in the program. No recent Hgb A1C readings. He continues to say he is breathing better and he is pleased wtih his weight loss. He continues to be able to work without difficulty and is doing more at home. He has recently started Chantix to try and quit smoking. He is down to 2 cigarettes per day now. Will continue to monitor for progress.      Expected Outcomes  Patient will continue to attend sessions and complete the program and meet his personal goals.   Patient will continue to attend sessions and complete the program and meet his personal goals.   Patient will  continue to attend sessions and complete the program and meet his personal goals.         Core Components/Risk Factors/Patient Goals at Discharge (Final Review):  Goals and Risk Factor Review - 03/26/18 1547      Core Components/Risk Factors/Patient Goals Review   Personal Goals Review  Weight Management/Obesity;Improve shortness of breath with ADL's;Diabetes;Tobacco Cessation Breathe better; lose weight 15 lbs; do regular ADL's.    Review  Patient has completed 18 sessions losing 5.4 lbs since he started the program. He continues to do well in the program. No recent Hgb A1C readings. He continues to say he is breathing better and he is pleased wtih his weight loss. He continues to be able to work without difficulty and is doing more at home. He has recently started Chantix to try and quit smoking. He is down to 2 cigarettes per day now. Will continue to monitor for progress.     Expected Outcomes  Patient will continue to attend sessions and complete the program and meet his personal goals.        ITP Comments: ITP Comments    Row Name 01/08/18 1118           ITP Comments  Mr. Kuhner is a pleasant 57 year old African American male. He has not been in our program before. He is eager to get started. Wants to get stronger so that he can get back to his regular activites.           Comments: ITP 30 Day REVIEW Pt is making expected progress toward pulmonary rehab goals after completing 18 sessions. Recommend continued exercise, life style modification, education, and utilization of breathing techniques to increase stamina and strength and decrease shortness of breath with exertion.

## 2018-03-26 NOTE — Progress Notes (Signed)
Daily Session Note  Patient Details  Name: Ricardo Burch MRN: [id number] Date of Birth: Jul 10, 1961 Referring Provider:     PULMONARY REHAB COPD ORIENTATION from 01/08/2018 in [place]  Referring Provider  DR. [name]      Encounter Date: 03/26/2018  Check In: Session Check In - 03/26/18 1401      Check-In   Location  [facility]    Staff Present  Diane Angelina Pih, MS, EP, [facility], Exercise Physiologist;Kawanna Christley Luther Parody, BS, EP, Exercise Physiologist;Debra Wynetta Emery, RN, BSN    Supervising physician immediately available to respond to emergencies  See telemetry face sheet for immediately available MD    Medication changes reported      Yes    Comments  Patient has started Chantix in order to help smoking cessation     Fall or balance concerns reported     No    Tobacco Cessation  No Change    Warm-up and Cool-down  Performed as group-led instruction    Resistance Training Performed  Yes    VAD Patient?  No      Pain Assessment   Currently in Pain?  No/denies    Pain Score  0-No pain    Multiple Pain Sites  No       Capillary Blood Glucose: No results found for this or any previous visit (from the past 24 hour(s)).    Social History   Tobacco Use  Smoking Status Light Tobacco Smoker  . Packs/day: 0.25  . Years: 38.00  . Pack years: 9.50  . Types: Cigarettes  Smokeless Tobacco Never Used    Goals Met:  Independence with exercise equipment Improved SOB with ADL's Using PLB without cueing & demonstrates good technique Exercise tolerated well Personal goals reviewed No report of cardiac concerns or symptoms Strength training completed today  Goals Unmet:  Not Applicable  Comments: Check out 230   Dr. Sinda Du is Medical Director for [facility] Pulmonary Rehab.

## 2018-03-31 ENCOUNTER — Encounter (HOSPITAL_COMMUNITY)
Admission: RE | Admit: 2018-03-31 | Discharge: 2018-03-31 | Disposition: A | Discharge status: Home / Self Care | Payer: Medicare Other | Source: Ambulatory Visit | Attending: Cardiovascular Disease | Admitting: Cardiovascular Disease

## 2018-03-31 DIAGNOSIS — Z9989 Dependence on other enabling machines and devices: Secondary | ICD-10-CM | POA: Diagnosis not present

## 2018-03-31 DIAGNOSIS — J449 Chronic obstructive pulmonary disease, unspecified: Secondary | ICD-10-CM

## 2018-03-31 DIAGNOSIS — G4733 Obstructive sleep apnea (adult) (pediatric): Secondary | ICD-10-CM | POA: Diagnosis not present

## 2018-03-31 NOTE — Progress Notes (Signed)
Daily Session Note  Patient Details  Name: Ricardo Burch MRN: [id number] Date of Birth: [birth year] Referring Provider:     PULMONARY REHAB COPD ORIENTATION from 01/08/2018 in [place]  Referring Provider  DR. [name]      Encounter Date: 03/31/2018  Check In: Session Check In - 03/31/18 1414      Check-In   Location  [facility]    Staff Present  Diane Angelina Pih, MS, EP, [facility], Exercise Physiologist;Zebbie Ace Luther Parody, BS, EP, Exercise Physiologist    Supervising physician immediately available to respond to emergencies  See telemetry face sheet for immediately available MD    Medication changes reported      No    Fall or balance concerns reported     No    Warm-up and Cool-down  Performed as group-led instruction    Resistance Training Performed  Yes    VAD Patient?  No      Pain Assessment   Currently in Pain?  No/denies    Pain Score  0-No pain    Multiple Pain Sites  No       Capillary Blood Glucose: No results found for this or any previous visit (from the past 24 hour(s)).    Social History   Tobacco Use  Smoking Status Light Tobacco Smoker  . Packs/day: 0.25  . Years: 38.00  . Pack years: 9.50  . Types: Cigarettes  Smokeless Tobacco Never Used    Goals Met:  Independence with exercise equipment Improved SOB with ADL's Using PLB without cueing & demonstrates good technique Exercise tolerated well No report of cardiac concerns or symptoms Strength training completed today  Goals Unmet:  Not Applicable  Comments: Check out 230   Dr. Sinda Du is Medical Director for [facility] Pulmonary Rehab.

## 2018-04-02 ENCOUNTER — Encounter (HOSPITAL_COMMUNITY)
Admission: RE | Admit: 2018-04-02 | Discharge: 2018-04-02 | Disposition: A | Discharge status: Home / Self Care | Payer: Medicare Other | Source: Ambulatory Visit | Attending: Cardiovascular Disease | Admitting: Cardiovascular Disease

## 2018-04-02 DIAGNOSIS — G4733 Obstructive sleep apnea (adult) (pediatric): Secondary | ICD-10-CM | POA: Diagnosis not present

## 2018-04-02 DIAGNOSIS — J449 Chronic obstructive pulmonary disease, unspecified: Secondary | ICD-10-CM

## 2018-04-02 DIAGNOSIS — Z9989 Dependence on other enabling machines and devices: Secondary | ICD-10-CM | POA: Diagnosis not present

## 2018-04-02 NOTE — Progress Notes (Signed)
Daily Session Note  Patient Details  Name: Nuchem Grattan MRN: [id number] Date of Birth: [birth year] Referring Provider:     PULMONARY REHAB COPD ORIENTATION from 01/08/2018 in [place]  Referring Provider  DR. [name]      Encounter Date: 04/02/2018  Check In: Session Check In - 04/02/18 1347      Check-In   Location  [facility]    Staff Present  Diane Angelina Pih, MS, EP, [facility], Exercise Physiologist;Alisse Tuite Luther Parody, BS, EP, Exercise Physiologist;Debra Wynetta Emery, RN, BSN    Supervising physician immediately available to respond to emergencies  See telemetry face sheet for immediately available MD    Medication changes reported      No    Fall or balance concerns reported     No    Warm-up and Cool-down  Performed as group-led instruction    Resistance Training Performed  Yes    VAD Patient?  No      Pain Assessment   Currently in Pain?  No/denies    Pain Score  0-No pain    Multiple Pain Sites  No       Capillary Blood Glucose: No results found for this or any previous visit (from the past 24 hour(s)).    Social History   Tobacco Use  Smoking Status Light Tobacco Smoker  . Packs/day: 0.25  . Years: 38.00  . Pack years: 9.50  . Types: Cigarettes  Smokeless Tobacco Never Used    Goals Met:  Independence with exercise equipment Improved SOB with ADL's Using PLB without cueing & demonstrates good technique Exercise tolerated well No report of cardiac concerns or symptoms Strength training completed today  Goals Unmet:  Not Applicable  Comments: Check out 230   Dr. Sinda Du is Medical Director for [facility] Pulmonary Rehab.

## 2018-04-07 ENCOUNTER — Emergency Department (HOSPITAL_COMMUNITY)
Admission: EM | Admit: 2018-04-07 | Discharge: 2018-04-07 | Disposition: A | Discharge status: Home / Self Care | Payer: Medicare Other | Attending: Emergency Medicine | Admitting: Emergency Medicine

## 2018-04-07 ENCOUNTER — Encounter (HOSPITAL_COMMUNITY): Payer: Self-pay | Admitting: Emergency Medicine

## 2018-04-07 ENCOUNTER — Other Ambulatory Visit: Payer: Self-pay

## 2018-04-07 ENCOUNTER — Encounter (HOSPITAL_COMMUNITY): Payer: Medicare Other

## 2018-04-07 DIAGNOSIS — Z79899 Other long term (current) drug therapy: Secondary | ICD-10-CM | POA: Insufficient documentation

## 2018-04-07 DIAGNOSIS — Z7982 Long term (current) use of aspirin: Secondary | ICD-10-CM | POA: Diagnosis not present

## 2018-04-07 DIAGNOSIS — J449 Chronic obstructive pulmonary disease, unspecified: Secondary | ICD-10-CM | POA: Diagnosis not present

## 2018-04-07 DIAGNOSIS — L539 Erythematous condition, unspecified: Secondary | ICD-10-CM | POA: Diagnosis not present

## 2018-04-07 DIAGNOSIS — L209 Atopic dermatitis, unspecified: Secondary | ICD-10-CM

## 2018-04-07 DIAGNOSIS — N189 Chronic kidney disease, unspecified: Secondary | ICD-10-CM | POA: Diagnosis not present

## 2018-04-07 DIAGNOSIS — I130 Hypertensive heart and chronic kidney disease with heart failure and stage 1 through stage 4 chronic kidney disease, or unspecified chronic kidney disease: Secondary | ICD-10-CM | POA: Insufficient documentation

## 2018-04-07 DIAGNOSIS — I509 Heart failure, unspecified: Secondary | ICD-10-CM | POA: Insufficient documentation

## 2018-04-07 DIAGNOSIS — R21 Rash and other nonspecific skin eruption: Secondary | ICD-10-CM | POA: Diagnosis present

## 2018-04-07 DIAGNOSIS — F17210 Nicotine dependence, cigarettes, uncomplicated: Secondary | ICD-10-CM | POA: Insufficient documentation

## 2018-04-07 MED ORDER — TRIAMCINOLONE ACETONIDE 0.1 % EX CREA: 1.0000 "application " | TOPICAL_CREAM | Freq: Three times a day (TID) | CUTANEOUS | 0 refills | Status: DC

## 2018-04-07 MED ORDER — PREDNISONE 20 MG PO TABS
40.0000 mg | ORAL_TABLET | Freq: Once | ORAL | Status: AC
  Administered 2018-04-07: 40 mg via ORAL
  Filled 2018-04-07: qty 2

## 2018-04-07 MED ORDER — DIPHENHYDRAMINE HCL 25 MG PO TABS: 25.0000 mg | ORAL_TABLET | Freq: Four times a day (QID) | ORAL | 0 refills | Status: DC

## 2018-04-07 NOTE — ED Triage Notes (Signed)
Rash/ezcema flare up for 1 month. Pt has history of same.

## 2018-04-07 NOTE — ED Provider Notes (Signed)
[facility] EMERGENCY DEPARTMENT Provider Note   CSN: [id number] Arrival date & time: 04/07/18  1125     History   Chief Complaint Chief Complaint  Patient presents with  . Rash    HPI Ricardo Burch is a 57 y.o. male.  HPI  Ricardo Burch is a 57 y.o. male who presents to the Emergency Department complaining of recurrent flare of his eczema.  He states several days ago he noticed itching and red patches to his forearms and hands.  Symptoms began after using car cleaning products.  He states his symptoms are similar to previous eczema flares.  He has not tried any medications or creams at home.  He denies swelling, drainage, fever, chills, or pain.  No history of tick bite,  Of note, O2 sat of 90% on arrival.  Patient is on continuous home O2 secondary to COPD and he did not bring his oxygen tank with him.  Denies shortness of breath or chest pain.   Past Medical History:  Diagnosis Date  . Allergy   . CHF (congestive heart failure) ([place])   . CHF (congestive heart failure) ([place])   . Chronic kidney disease   . COPD (chronic obstructive pulmonary disease) ([place]) Dx 2015  . Depression   . Eczema    since childhood   . Hyperlipidemia 01/12/2018  . Hypertension   . Oxygen deficiency   . Sleep apnea    CPAP  . Substance abuse ([place])    MJ, cocaine    Patient Active Problem List   Diagnosis Date Noted  . Premature ejaculation 01/12/2018  . Hyperlipidemia 01/12/2018  . Hypertension 10/14/2017  . Aortic atherosclerosis ([place]) 09/15/2017  . Pre-diabetes 09/15/2017  . Cocaine abuse ([place]) 07/26/2016  . Depression 07/26/2016  . OSA on CPAP 07/26/2016  . CHF (congestive heart failure) ([place]) 06/27/2016  . Alcohol abuse 06/27/2016  . COPD (chronic obstructive pulmonary disease) ([place]) 08/23/2014  . Eczema 08/23/2014  . Right knee pain 08/23/2014  . Tobacco abuse 03/27/2014    Past Surgical History:  Procedure Laterality Date  . COLONOSCOPY WITH PROPOFOL N/A 06/25/2017   Procedure:  COLONOSCOPY WITH PROPOFOL;  Surgeon: Daneil Dolin, MD;  [facility];  Service: Endoscopy;  Laterality: N/A;  2:00pm  . MULTIPLE EXTRACTIONS WITH ALVEOLOPLASTY N/A 03/22/2016   Procedure: MULTIPLE EXTRACTION WITH ALVEOLOPLASTY;  Surgeon: Diona Browner, DDS;  Location: [facility];  Service: Oral Surgery;  Laterality: N/A;  . NO PAST SURGERIES    . POLYPECTOMY  06/25/2017   Procedure: POLYPECTOMY;  Surgeon: Daneil Dolin, MD;  Location: [facility];  Service: Endoscopy;;  colon        Home Medications    Prior to Admission medications   Medication Sig Start Date End Date Taking? Authorizing Provider  amLODipine (NORVASC) 5 MG tablet Take 1 tablet (5 mg total) by mouth daily. 10/14/17   Raylene Everts, MD  ANORO ELLIPTA 62.5-25 MCG/INH AEPB INHALE 1 PUFF INTO THE LUNGS DAILY. 03/20/18   Marshell Garfinkel, MD  aspirin EC 81 MG tablet Take 1 tablet (81 mg total) by mouth daily. 09/20/15   Florencia Reasons, MD  DALIRESP 500 MCG TABS tablet Take 1 tablet (500 mcg total) by mouth daily. 10/23/17   Magdalen Spatz, NP  diphenhydrAMINE (BENADRYL) 25 MG tablet Take 1 tablet (25 mg total) by mouth every 6 (six) hours. As needed for itching 04/07/18   Taite Schoeppner, PA-C  Guaifenesin (MUCINEX MAXIMUM STRENGTH) 1200 MG TB12 Take 1,200 mg  by mouth daily.    [provider]  OXYGEN Inhale 2 L into the lungs continuous.    [provider]  sertraline (ZOLOFT) 25 MG tablet Take 1 tablet (25 mg total) by mouth daily. 01/12/18   Raylene Everts, MD  triamcinolone cream (KENALOG) 0.1 % Apply 1 application topically 3 (three) times daily. Apply to affected areas 04/07/18   Tahjanae Blankenburg, PA-C  varenicline (CHANTIX STARTING MONTH PAK) 0.5 MG X 11 & 1 MG X 42 tablet Take 1 0.5 mg tablet daily for 3 days, then increase to 1 0.5 mg tablet twice daily for 4 days, then increase to 1 1 mg tablet twice daily. 03/18/18   Mannam, Hart Robinsons, MD  VENTOLIN HFA 108 (90 Base) MCG/ACT inhaler Inhale 1-2 puffs  into the lungs every 6 (six) hours as needed for wheezing or shortness of breath. (200/12=17) 12/31/17   Marshell Garfinkel, MD    Family History Family History  Problem Relation Age of Onset  . Arthritis Mother   . Hypertension Mother   . Stroke Mother        age 23  . Cerebral aneurysm Father   . Alcohol abuse Father   . Cancer Brother   . Diabetes Neg Hx   . Heart disease Neg Hx     Social History Social History   Tobacco Use  . Smoking status: Light Tobacco Smoker    Packs/day: 0.25    Years: 38.00    Pack years: 9.50    Types: Cigarettes  . Smokeless tobacco: Never Used  Substance Use Topics  . Alcohol use: Yes    Alcohol/week: 0.6 oz    Types: 1 Cans of beer per week    Comment: occassionally: weekends, 6-pack or less on a day; or half pint of liquior  . Drug use: Yes    Types: Marijuana, Cocaine    Comment: not current     Allergies   Apresoline [hydralazine]   Review of Systems Review of Systems  Constitutional: Negative for activity change, appetite change, chills and fever.  HENT: Negative for facial swelling, sore throat and trouble swallowing.   Respiratory: Negative for chest tightness, shortness of breath and wheezing.   Musculoskeletal: Negative for neck pain and neck stiffness.  Skin: Positive for rash. Negative for wound.  Neurological: Negative for dizziness, weakness, numbness and headaches.  All other systems reviewed and are negative.    Physical Exam Updated Vital Signs BP (!) 122/93 (BP Location: Right Arm)   Pulse 95   Temp 99.1 F (37.3 C) (Oral)   Resp 18   Ht 5\' 7"  (1.702 m)   Wt 111.1 kg (245 lb)   SpO2 90% Comment: pt states hasn't had abrelo today and states oxygen only intermittently.  BMI 38.37 kg/m   Physical Exam  Constitutional: He is oriented to person, place, and time. He appears well-developed and well-nourished. No distress.  HENT:  Head: Normocephalic and atraumatic.  Mouth/Throat: Oropharynx is clear and  moist.  Neck: Normal range of motion. Neck supple.  Cardiovascular: Normal rate, regular rhythm and intact distal pulses.  No murmur heard. Pulmonary/Chest: Effort normal and breath sounds normal. No respiratory distress.  Musculoskeletal: He exhibits no edema or tenderness.  Lymphadenopathy:    He has no cervical adenopathy.  Neurological: He is alert and oriented to person, place, and time. He exhibits normal muscle tone. Coordination normal.  Skin: Skin is warm. Capillary refill takes less than 2 seconds. Rash noted. There is erythema.  Erythematous plaques to the bilateral mid forearms and dorsal left hand.  Plaques have well-defined margins.  No edema,  pustules or vesicles.  Nursing note and vitals reviewed.    ED Treatments / Results  Labs (all labs ordered are listed, but only abnormal results are displayed) Labs Reviewed - No data to display  EKG None  Radiology No results found.  Procedures Procedures (including critical care time)  Medications Ordered in ED Medications  predniSONE (DELTASONE) tablet 40 mg (has no administration in time range)     Initial Impression / Assessment and Plan / ED Course  I have reviewed the triage vital signs and the nursing notes.  Pertinent labs & imaging results that were available during my care of the patient were reviewed by me and considered in my medical decision making (see chart for details).     Patient with likely flare of atopic dermatitis.  Placed on 2 L O2 nasal cannula by nursing on arrival due to history of asthma and patient did not bring his oxygen tank.  He denies chest pain or shortness of breath.  Agrees to plan with steroid cream and Benadryl, PCP follow-up if needed.  Final Clinical Impressions(s) / ED Diagnoses   Final diagnoses:  Atopic dermatitis, unspecified type    ED Discharge Orders        Ordered    triamcinolone cream (KENALOG) 0.1 %  3 times daily     04/07/18 1203    diphenhydrAMINE  (BENADRYL) 25 MG tablet  Every 6 hours     04/07/18 1203       Kem Parkinson, PA-C 04/07/18 1212    Noemi Chapel, MD 04/09/18 0830

## 2018-04-07 NOTE — Discharge Instructions (Addendum)
Use the cream as directed in the Benadryl if needed for itching.  Try to use a moisturizer on your skin after bathing.  Follow-up with your primary doctor or the clinic listed for recheck if needed.

## 2018-04-07 NOTE — ED Notes (Signed)
Pt is on chronic O2 intermittently. Tech has put pt on 2L O2

## 2018-04-08 ENCOUNTER — Encounter: Payer: Self-pay | Admitting: Pulmonary Disease

## 2018-04-08 NOTE — Progress Notes (Signed)
CPAP download 02/09/2018-03/10/2018 100% usage.  CPAP set at 12 cm AHI 19.6, central apnea index 0.  No leaks Continues to have moderate apnea events on CPAP therapy.  Reccs: Switch CPAP to AutoSet 10-18.  Continue supplemental oxygen as before Follow-up in clinic in 3 months with download.  Marshell Garfinkel MD [facility] 04/08/2018, 3:13 PM

## 2018-04-09 ENCOUNTER — Encounter (HOSPITAL_COMMUNITY): Payer: Medicare Other

## 2018-04-09 ENCOUNTER — Encounter (HOSPITAL_COMMUNITY)
Admission: RE | Admit: 2018-04-09 | Discharge: 2018-04-09 | Disposition: A | Discharge status: Home / Self Care | Payer: Medicare Other | Source: Ambulatory Visit | Attending: Cardiovascular Disease | Admitting: Cardiovascular Disease

## 2018-04-09 DIAGNOSIS — J449 Chronic obstructive pulmonary disease, unspecified: Secondary | ICD-10-CM | POA: Insufficient documentation

## 2018-04-09 DIAGNOSIS — Z9989 Dependence on other enabling machines and devices: Secondary | ICD-10-CM | POA: Diagnosis not present

## 2018-04-09 DIAGNOSIS — G4733 Obstructive sleep apnea (adult) (pediatric): Secondary | ICD-10-CM | POA: Diagnosis not present

## 2018-04-09 NOTE — Progress Notes (Signed)
Daily Session Note  Patient Details  Name: Ricardo Burch MRN: [id number] Date of Birth: [birth year] Referring Provider:     PULMONARY REHAB COPD ORIENTATION from 01/08/2018 in [place]  Referring Provider  DR. [name]      Encounter Date: 04/09/2018  Check In: Session Check In - 04/09/18 1330      Check-In   Location  [facility]    Staff Present  Rylyn Zawistowski Angelina Pih, MS, EP, [facility], Exercise Physiologist;Debra Wynetta Emery, RN, BSN    Supervising physician immediately available to respond to emergencies  See telemetry face sheet for immediately available MD    Medication changes reported      No    Fall or balance concerns reported     No    Tobacco Cessation  No Change    Warm-up and Cool-down  Performed as group-led instruction    Resistance Training Performed  Yes    VAD Patient?  No      Pain Assessment   Currently in Pain?  No/denies    Pain Score  0-No pain    Multiple Pain Sites  No       Capillary Blood Glucose: No results found for this or any previous visit (from the past 24 hour(s)).    Social History   Tobacco Use  Smoking Status Light Tobacco Smoker  . Packs/day: 0.25  . Years: 38.00  . Pack years: 9.50  . Types: Cigarettes  Smokeless Tobacco Never Used    Goals Met:  Proper associated with RPD/PD & O2 Sat Improved SOB with ADL's Using PLB without cueing & demonstrates good technique Exercise tolerated well Personal goals reviewed No report of cardiac concerns or symptoms Strength training completed today  Goals Unmet:  Not Applicable  Comments: [id number]   Dr. Sinda Du is Medical Director for [facility].

## 2018-04-14 ENCOUNTER — Encounter (HOSPITAL_COMMUNITY)
Admission: RE | Admit: 2018-04-14 | Discharge: 2018-04-14 | Disposition: A | Discharge status: Home / Self Care | Payer: Medicare Other | Source: Ambulatory Visit | Attending: Cardiovascular Disease | Admitting: Cardiovascular Disease

## 2018-04-14 ENCOUNTER — Encounter (HOSPITAL_COMMUNITY): Payer: Medicare Other

## 2018-04-14 DIAGNOSIS — J449 Chronic obstructive pulmonary disease, unspecified: Secondary | ICD-10-CM

## 2018-04-14 DIAGNOSIS — G4733 Obstructive sleep apnea (adult) (pediatric): Secondary | ICD-10-CM | POA: Diagnosis not present

## 2018-04-14 DIAGNOSIS — Z9989 Dependence on other enabling machines and devices: Secondary | ICD-10-CM | POA: Diagnosis not present

## 2018-04-14 NOTE — Progress Notes (Deleted)
Daily Session Note  Patient Details  Name: Ricardo Burch MRN: [id number] Date of Birth: [birth year] Referring Provider:     PULMONARY REHAB COPD ORIENTATION from 01/08/2018 in [place]  Referring Provider  DR. [name]      Encounter Date: 04/14/2018  Check In: Session Check In - 04/14/18 1410      Check-In   Location  --    Staff Present  --    Supervising physician immediately available to respond to emergencies  --    Medication changes reported      --    Fall or balance concerns reported     --    Tobacco Cessation  --    Warm-up and Cool-down  --    Resistance Training Performed  --    VAD Patient?  --      Pain Assessment   Currently in Pain?  --    Pain Score  --    Multiple Pain Sites  --       Capillary Blood Glucose: No results found for this or any previous visit (from the past 24 hour(s)).    Social History   Tobacco Use  Smoking Status Light Tobacco Smoker  . Packs/day: 0.25  . Years: 38.00  . Pack years: 9.50  . Types: Cigarettes  Smokeless Tobacco Never Used    Goals Met:  Independence with exercise equipment Improved SOB with ADL's Using PLB without cueing & demonstrates good technique Exercise tolerated well No report of cardiac concerns or symptoms Strength training completed today  Goals Unmet:  Not Applicable  Comments: Check out 230   Dr. Sinda Du is Medical Director for [facility] Pul[facility] Re[facility].

## 2018-04-14 NOTE — Progress Notes (Signed)
Daily Session Note  Patient Details  Name: Ricardo Burch MRN: [id number] Date of Birth: 10/30/1961 Referring Provider:     PULMONARY REHAB COPD ORIENTATION from 01/08/2018 in [place] CARDIAC REHABILITATION  Referring Provider  DR. Koneswaran      Encounter Date: 04/14/2018  Check In: Session Check In - 04/14/18 1410      Check-In   Location  [facility]    Staff Present  Diane Coad, MS, EP, [facility], Exercise Physiologist;Prophet Renwick, BS, EP, Exercise Physiologist    Supervising physician immediately available to respond to emergencies  See telemetry face sheet for immediately available MD    Medication changes reported      No    Fall or balance concerns reported     No    Tobacco Cessation  Use Decreased    Warm-up and Cool-down  Performed as group-led instruction    Resistance Training Performed  Yes    VAD Patient?  No      Pain Assessment   Currently in Pain?  No/denies    Pain Score  0-No pain    Multiple Pain Sites  No       Capillary Blood Glucose: No results found for this or any previous visit (from the past 24 hour(s)).    Social History   Tobacco Use  Smoking Status Light Tobacco Smoker  . Packs/day: 0.25  . Years: 38.00  . Pack years: 9.50  . Types: Cigarettes  Smokeless Tobacco Never Used    Goals Met:  Independence with exercise equipment Improved SOB with ADL's Using PLB without cueing & demonstrates good technique Exercise tolerated well No report of cardiac concerns or symptoms Strength training completed today  Goals Unmet:  Not Applicable  Comments: Check out 230   Dr. Edward Hawkins is Medical Director for [place] [facility]. 

## 2018-04-14 NOTE — Progress Notes (Deleted)
Daily Session Note  Patient Details  Name: Ricardo Burch MRN: [id number] Date of Birth: [birth year] Referring Provider:     PULMONARY REHAB COPD ORIENTATION from 01/08/2018 in [place]  Referring Provider  DR. [name]      Encounter Date: 04/14/2018  Check In: Session Check In - 04/14/18 1410      Check-In   Location  [facility]    Staff Present  Diane Angelina Pih, MS, EP, [facility], Exercise Physiologist;Youcef Klas Luther Parody, BS, EP, Exercise Physiologist    Supervising physician immediately available to respond to emergencies  See telemetry face sheet for immediately available MD    Medication changes reported      No    Fall or balance concerns reported     No    Tobacco Cessation  Use Decreased    Warm-up and Cool-down  Performed as group-led instruction    Resistance Training Performed  Yes    VAD Patient?  No      Pain Assessment   Currently in Pain?  No/denies    Pain Score  0-No pain    Multiple Pain Sites  No       Capillary Blood Glucose: No results found for this or any previous visit (from the past 24 hour(s)).    Social History   Tobacco Use  Smoking Status Light Tobacco Smoker  . Packs/day: 0.25  . Years: 38.00  . Pack years: 9.50  . Types: Cigarettes  Smokeless Tobacco Never Used    Goals Met:  Independence with exercise equipment Improved SOB with ADL's Using PLB without cueing & demonstrates good technique Exercise tolerated well No report of cardiac concerns or symptoms Strength training completed today  Goals Unmet:  Not Applicable  Comments: Check out 230   Dr. Sinda Du is Medical Director for [facility] Re[facility].

## 2018-04-16 ENCOUNTER — Encounter (HOSPITAL_COMMUNITY): Payer: Medicare Other

## 2018-04-17 NOTE — Progress Notes (Signed)
Pulmonary Individual Treatment Plan  Patient Details  Name: Ricardo Burch MRN: [id number] Date of Birth: [birth year] Referring Provider:     PULMONARY REHAB COPD ORIENTATION from 01/08/2018 in [place]  Referring Provider  DR. Koneswaran      Initial Encounter Date:    PULMONARY REHAB COPD ORIENTATION from 01/08/2018 in [place]  Date  01/08/18  Referring Provider  DR. Koneswaran      Visit Diagnosis: Chronic obstructive pulmonary disease, unspecified COPD type ([place])  Patient's Home Medications on Admission:   Current Outpatient Medications:  .  amLODipine (NORVASC) 5 MG tablet, Take 1 tablet (5 mg total) by mouth daily., Disp: 90 tablet, Rfl: 3 .  ANORO ELLIPTA 62.5-25 MCG/INH AEPB, INHALE 1 PUFF INTO THE LUNGS DAILY., Disp: 60 each, Rfl: 0 .  aspirin EC 81 MG tablet, Take 1 tablet (81 mg total) by mouth daily., Disp: 30 tablet, Rfl: 0 .  DALIRESP 500 MCG TABS tablet, Take 1 tablet (500 mcg total) by mouth daily., Disp: 30 tablet, Rfl: 5 .  diphenhydrAMINE (BENADRYL) 25 MG tablet, Take 1 tablet (25 mg total) by mouth every 6 (six) hours. As needed for itching, Disp: 20 tablet, Rfl: 0 .  Guaifenesin (MUCINEX MAXIMUM STRENGTH) 1200 MG TB12, Take 1,200 mg by mouth daily., Disp: , Rfl:  .  OXYGEN, Inhale 2 L into the lungs continuous., Disp: , Rfl:  .  sertraline (ZOLOFT) 25 MG tablet, Take 1 tablet (25 mg total) by mouth daily., Disp: 30 tablet, Rfl: 2 .  triamcinolone cream (KENALOG) 0.1 %, Apply 1 application topically 3 (three) times daily. Apply to affected areas, Disp: 453.6 g, Rfl: 0 .  varenicline (CHANTIX STARTING MONTH PAK) 0.5 MG X 11 & 1 MG X 42 tablet, Take 1 0.5 mg tablet daily for 3 days, then increase to 1 0.5 mg tablet twice daily for 4 days, then increase to 1 1 mg tablet twice daily., Disp: 53 tablet, Rfl: 0 .  VENTOLIN HFA 108 (90 Base) MCG/ACT inhaler, Inhale 1-2 puffs into the lungs every 6 (six) hours as needed for  wheezing or shortness of breath. (200/12=17), Disp: 18 g, Rfl: 2  Past Medical History: Past Medical History:  Diagnosis Date  . Allergy   . CHF (congestive heart failure) ([place])   . CHF (congestive heart failure) ([place])   . Chronic kidney disease   . COPD (chronic obstructive pulmonary disease) ([place]) Dx 2015  . Depression   . Eczema    since childhood   . Hyperlipidemia 01/12/2018  . Hypertension   . Oxygen deficiency   . Sleep apnea    CPAP  . Substance abuse ([place])    MJ, cocaine    Tobacco Use: Social History   Tobacco Use  Smoking Status Light Tobacco Smoker  . Packs/day: 0.25  . Years: 38.00  . Pack years: 9.50  . Types: Cigarettes  Smokeless Tobacco Never Used    Labs: Recent Review Flowsheet Data    Labs for ITP Cardiac and Pulmonary Rehab Latest Ref Rng & Units 09/17/2015 07/26/2016 07/27/2016 07/27/2016 10/14/2017   Cholestrol <200 mg/dL - - - - 136   LDLCALC mg/dL (calc) - - - - 60   HDL >40 mg/dL - - - - 60   Trlycerides <150 mg/dL - - - - 82   Hemoglobin A1c <5.7 % of total Hgb - - - - 5.3   PHART 7.350 - 7.450 7.274(L) - 7.184(LL) 7.341(L) -   PCO2ART 32.0 -  48.0 mmHg 81.2(HH) - 0.0(LL) 84.2(HH) -   HCO3 20.0 - 28.0 mmol/L 36.7(H) - 47.0(H) 44.3(H) -   TCO2 0 - 100 mmol/L 32.2 44 51.0 - -   O2SAT % 90.5 - 82.8 92.0 -      Capillary Blood Glucose: Lab Results  Component Value Date   GLUCAP 179 (H) 07/27/2016   GLUCAP 156 (H) 07/27/2016   GLUCAP 140 (H) 09/16/2015     Pulmonary Assessment Scores: Pulmonary Assessment Scores    Row Name 01/08/18 1116         ADL UCSD   ADL Phase  Entry     SOB Score total  31     Rest  0     Walk  3     Stairs  4     Bath  1     Dress  0     Shop  1       CAT Score   CAT Score  23       mMRC Score   mMRC Score  2        Pulmonary Function Assessment: Pulmonary Function Assessment - 01/08/18 1112      Pulmonary Function Tests   FVC%  58 %    FEV1%  45 %    FEV1/FVC Ratio  76    RV%  142 %     DLCO%  56 %      Post Bronchodilator Spirometry Results   FVC%  2.72 %    FEV1%  1.65 %    FEV1/FVC Ratio  61      Breath   Bilateral Breath Sounds  Clear    Shortness of Breath  Yes       Exercise Target Goals:    Exercise Program Goal: Individual exercise prescription set using results from initial 6 min walk test and THRR while considering  patient's activity barriers and safety.   Exercise Prescription Goal: Initial exercise prescription builds to 30-45 minutes a day of aerobic activity, 2-3 days per week.  Home exercise guidelines will be given to patient during program as part of exercise prescription that the participant will acknowledge.  Activity Barriers & Risk Stratification: Activity Barriers & Cardiac Risk Stratification - 01/08/18 0945      Activity Barriers & Cardiac Risk Stratification   Activity Barriers  Shortness of Breath    Cardiac Risk Stratification  Moderate       6 Minute Walk: 6 Minute Walk    Row Name 01/08/18 0943         6 Minute Walk   Phase  Initial     Distance  1100 feet     Distance % Change  0 %     Distance Feet Change  0 ft     Walk Time  6 minutes     # of Rest Breaks  0     MPH  2.08     METS  2.59     RPE  11     Perceived Dyspnea   14     VO2 Peak  10.63     Symptoms  No     Resting HR  91 bpm     Resting BP  124/90     Resting Oxygen Saturation   92 %     Exercise Oxygen Saturation  during 6 min walk  82 %     Max Ex. HR  112 bpm     Max Ex.  BP  154/96     2 Minute Post BP  126/88        Oxygen Initial Assessment: Oxygen Initial Assessment - 01/08/18 1032      Home Oxygen   Home Oxygen Device  E-Tanks;Home Concentrator    Sleep Oxygen Prescription  CPAP    Liters per minute  2    Home Exercise Oxygen Prescription  None    Home at Rest Exercise Oxygen Prescription  Continuous    Liters per minute  2    Compliance with Home Oxygen Use  Yes      Initial 6 min Walk   Oxygen Used  Continuous    Liters  per minute  4      Program Oxygen Prescription   Program Oxygen Prescription  Continuous;E-Tanks    Liters per minute  2      Intervention   Short Term Goals  To learn and demonstrate proper pursed lip breathing techniques or other breathing techniques.;To learn and understand importance of maintaining oxygen saturations>88%    Long  Term Goals  Exhibits proper breathing techniques, such as pursed lip breathing or other method taught during program session;Maintenance of O2 saturations>88%       Oxygen Re-Evaluation: Oxygen Re-Evaluation    Row Name 02/05/18 1405 03/05/18 1452 03/26/18 1546 04/17/18 1428       Program Oxygen Prescription   Program Oxygen Prescription  Continuous;E-Tanks  Continuous;E-Tanks  Continuous  Continuous    Liters per minute  2  4  4  4     FiO2%  97  90  91  92      Home Oxygen   Home Oxygen Device  E-Tanks;Home Concentrator  E-Tanks;Home Concentrator  E-Tanks;Home Concentrator  E-Tanks;Home Concentrator    Sleep Oxygen Prescription  CPAP  CPAP  CPAP  CPAP    Liters per minute  2  2  2  2     Home Exercise Oxygen Prescription  None  None  None  None    Home at Rest Exercise Oxygen Prescription  None  None  None  None    Compliance with Home Oxygen Use  Yes  Yes  Yes  Yes      Goals/Expected Outcomes   Short Term Goals  To learn and demonstrate proper pursed lip breathing techniques or other breathing techniques.;To learn and understand importance of maintaining oxygen saturations>88%  To learn and demonstrate proper pursed lip breathing techniques or other breathing techniques.;To learn and understand importance of maintaining oxygen saturations>88%  To learn and demonstrate proper pursed lip breathing techniques or other breathing techniques.;To learn and understand importance of maintaining oxygen saturations>88%  To learn and demonstrate proper pursed lip breathing techniques or other breathing techniques.;To learn and understand importance of  maintaining oxygen saturations>88%    Long  Term Goals  Exhibits proper breathing techniques, such as pursed lip breathing or other method taught during program session;Maintenance of O2 saturations>88%  Exhibits proper breathing techniques, such as pursed lip breathing or other method taught during program session;Maintenance of O2 saturations>88%  Exhibits proper breathing techniques, such as pursed lip breathing or other method taught during program session;Maintenance of O2 saturations>88%  Exhibits proper breathing techniques, such as pursed lip breathing or other method taught during program session;Maintenance of O2 saturations>88%    Comments  -  Patient demonstrates proper pursed lip breathing techniques during exercise and also demonstrates proper usage of pulse oximeter. He is able to verbalize the importance of maintaining his O2 saturations >  88%. Will continue to monitor.   Patient demonstrates proper pursed lip breathing techniques during exercise and also demonstrates proper usage of pulse oximeter. He is able to verbalize the importance of maintaining his O2 saturations >88%. Will continue to monitor.   Patient demonstrates proper pursed lip breathing techniques during exercise and also demonstrates proper usage of pulse oximeter. He is able to verbalize the importance of maintaining his O2 saturations >88%. Will continue to monitor.     Goals/Expected Outcomes  -  Patient will continue to meet both his short and long term goals.   Patient will continue to meet both his short and long term goals.   Patient will continue to meet both his short and long term goals.        Oxygen Discharge (Final Oxygen Re-Evaluation): Oxygen Re-Evaluation - 04/17/18 1428      Program Oxygen Prescription   Program Oxygen Prescription  Continuous    Liters per minute  4    FiO2%  92      Home Oxygen   Home Oxygen Device  E-Tanks;Home Concentrator    Sleep Oxygen Prescription  CPAP    Liters per minute   2    Home Exercise Oxygen Prescription  None    Home at Rest Exercise Oxygen Prescription  None    Compliance with Home Oxygen Use  Yes      Goals/Expected Outcomes   Short Term Goals  To learn and demonstrate proper pursed lip breathing techniques or other breathing techniques.;To learn and understand importance of maintaining oxygen saturations>88%    Long  Term Goals  Exhibits proper breathing techniques, such as pursed lip breathing or other method taught during program session;Maintenance of O2 saturations>88%    Comments  Patient demonstrates proper pursed lip breathing techniques during exercise and also demonstrates proper usage of pulse oximeter. He is able to verbalize the importance of maintaining his O2 saturations >88%. Will continue to monitor.     Goals/Expected Outcomes  Patient will continue to meet both his short and long term goals.        Initial Exercise Prescription: Initial Exercise Prescription - 01/08/18 0900      Date of Initial Exercise RX and Referring Provider   Date  01/08/18    Referring Provider  DR. Koneswaran      Treadmill   MPH  1.3    Grade  0    Minutes  15    METs  1.9      NuStep   Level  2    SPM  55    Minutes  20    METs  1.8      Prescription Details   Frequency (times per week)  3    Duration  Progress to 30 minutes of continuous aerobic without signs/symptoms of physical distress      Intensity   THRR 40-80% of Max Heartrate  [phone]    Ratings of Perceived Exertion  11-13    Perceived Dyspnea  0-4      Progression   Progression  Continue progressive overload as per policy without signs/symptoms or physical distress.      Resistance Training   Training Prescription  Yes    Weight  1    Reps  10-15       Perform Capillary Blood Glucose checks as needed.  Exercise Prescription Changes:  Exercise Prescription Changes    Row Name 01/15/18 1400 01/22/18 0700 02/02/18 1400 02/26/18 0800 03/10/18 1000  Response  to Exercise   Blood Pressure (Admit)  -  126/72  118/72  120/74  112/72   Blood Pressure (Exercise)  -  142/90  128/76  144/70  126/70   Blood Pressure (Exit)  -  126/72  114/66  120/74  112/72   Heart Rate (Admit)  -  106 bpm  96 bpm  92 bpm  102 bpm   Heart Rate (Exercise)  -  114 bpm  106 bpm  113 bpm  107 bpm   Heart Rate (Exit)  -  92 bpm  95 bpm  90 bpm  91 bpm   Oxygen Saturation (Admit)  -  93 %  92 %  93 %  91 %   Oxygen Saturation (Exercise)  -  95 %  91 %  91 %  90 %   Oxygen Saturation (Exit)  -  91 %  90 %  96 %  92 %   Rating of Perceived Exertion (Exercise)  -  11  11  11  11    Perceived Dyspnea (Exercise)  -  11  11  11  11    Duration  -  Progress to 30 minutes of  aerobic without signs/symptoms of physical distress  Progress to 30 minutes of  aerobic without signs/symptoms of physical distress  Progress to 30 minutes of  aerobic without signs/symptoms of physical distress  Progress to 30 minutes of  aerobic without signs/symptoms of physical distress   Intensity  -  THRR New 130-142-154  THRR New 124-138-152  THRR New 122-136-151  THRR New [phone]     Progression   Progression  -  Continue to progress workloads to maintain intensity without signs/symptoms of physical distress.  Continue to progress workloads to maintain intensity without signs/symptoms of physical distress.  Continue to progress workloads to maintain intensity without signs/symptoms of physical distress.  Continue to progress workloads to maintain intensity without signs/symptoms of physical distress.     Resistance Training   Training Prescription  Yes  Yes  Yes  Yes  Yes   Weight  1  2  2  4  4    Reps  10-15  10-15  10-15  10-15  10-15     Treadmill   MPH  1.3  1.4  1.4  1.7  1.8   Grade  0  0  0  0  0   Minutes  15  15  15  15  15    METs  1.9  2.07  2.07  2.3  2.4     NuStep   Level  2  2  2  2  2    SPM  55  80  87  93  98   Minutes  20  20  20  20  20    METs  1.8  1.9  1.9  2  2.3     Home  Exercise Plan   Plans to continue exercise at  Home (comment)  Home (comment)  Home (comment)  Home (comment)  Home (comment)   Frequency  Add 2 additional days to program exercise sessions.  Add 2 additional days to program exercise sessions.  Add 2 additional days to program exercise sessions.  Add 2 additional days to program exercise sessions.  Add 2 additional days to program exercise sessions.   Initial Home Exercises Provided  01/15/18  01/15/18  01/15/18  01/15/18  01/15/18   Row Name 03/23/18 1500 04/06/18 1400  Response to Exercise   Blood Pressure (Admit)  124/80  108/62      Blood Pressure (Exercise)  162/88  124/70      Blood Pressure (Exit)  116/70  112/64      Heart Rate (Admit)  98 bpm  96 bpm      Heart Rate (Exercise)  99 bpm  113 bpm      Heart Rate (Exit)  91 bpm  95 bpm      Oxygen Saturation (Admit)  92 %  93 %      Oxygen Saturation (Exercise)  91 %  90 %      Oxygen Saturation (Exit)  92 %  96 %      Rating of Perceived Exertion (Exercise)  10  11      Perceived Dyspnea (Exercise)  10  11      Duration  Progress to 30 minutes of  aerobic without signs/symptoms of physical distress  Progress to 30 minutes of  aerobic without signs/symptoms of physical distress      Intensity  THRR New [phone]  THRR New [phone]        Progression   Progression  Continue to progress workloads to maintain intensity without signs/symptoms of physical distress.  Continue to progress workloads to maintain intensity without signs/symptoms of physical distress.        Resistance Training   Training Prescription  Yes  Yes      Weight  4  4      Reps  10-15  10-15        Treadmill   MPH  1.8  1.9      Grade  0  0      Minutes  15  15      METs  2.4  2.4        NuStep   Level  2  3      SPM  92  94      Minutes  20  20      METs  1.8  2.3        Home Exercise Plan   Plans to continue exercise at  Home (comment)  Home (comment)      Frequency  Add 2 additional  days to program exercise sessions.  Add 2 additional days to program exercise sessions.      Initial Home Exercises Provided  01/15/18  01/15/18         Exercise Comments:  Exercise Comments    Row Name 01/15/18 1408 01/22/18 0758 02/02/18 1413 02/26/18 0813 03/10/18 1024   Exercise Comments  Patient has received the take home exercise plan today 01/15/2018. THR was addressed as were safety guidelines for being active when not here in [place]. Patient demonstrated an understanding and was encouraged to ask any future questions.   Patient is doing well in PR for just starting. Patient has increased his SPMs on the nustep and has increased his speed on th etreadmill to 1.4. Patients progression will continued to be monitored throughout the remainder of the program.   Patient continues to do well in PR and has maintained his level on both the treadmill as well as thet nustep machine. Patient has also increased his SPMs on the nustep machine. Patient has still only just started the program and will be progressed more in time.   Patient continues to do well in PR. Patient has increased his speed on the treadmill as well as increasing his  SPMs on the nustep machine. Patients O2 levels have been staying above 90 since he is attatched to the O2 here at [place]. Patient seems to be enjoying the program along with his classmates and we will continue to monitor the patient throughout the remainder of the program.   Patient continues to do well in PR. Patient has progressed on the treadmill to 1.61mph. Patient has also maintained his level on the nustep machine. 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 Patient states that he has also felt an increase in strength from PR.    [place] Name 03/23/18 1517 04/06/18 1447         Exercise Comments  Patient continues to do well in PR. patient has maintained his levels and speed on both of his machines. Patient  has admitted to only smoking 1-2 cigarettes a day and has also stated that he has lost weight and is breathing better now since he started the priogram. Patient wishes to continue down this path.   Patient is doing well in PR. Patient has increased his level on the nustep machine and has also increased his speed on the treadmill. Patient states that he feels much more energy from the program and is still attempting to quit smoking.          Exercise Goals and Review:  Exercise Goals    Row Name 01/08/18 0947             Exercise Goals   Increase Physical Activity  Yes       Intervention  Provide advice, education, support and counseling about physical activity/exercise needs.;Develop an individualized exercise prescription for aerobic and resistive training based on initial evaluation findings, risk stratification, comorbidities and participant's personal goals.       Expected Outcomes  Short Term: Attend rehab on a regular basis to increase amount of physical activity.       Increase Strength and Stamina  Yes       Intervention  Provide advice, education, support and counseling about physical activity/exercise needs.;Develop an individualized exercise prescription for aerobic and resistive training based on initial evaluation findings, risk stratification, comorbidities and participant's personal goals.       Expected Outcomes  Short Term: Increase workloads from initial exercise prescription for resistance, speed, and METs.       Able to understand and use rate of perceived exertion (RPE) scale  Yes       Intervention  Provide education and explanation on how to use RPE scale       Expected Outcomes  Short Term: Able to use RPE daily in rehab to express subjective intensity level;Long Term:  Able to use RPE to guide intensity level when exercising independently       Able to understand and use Dyspnea scale  Yes       Intervention  Provide education and explanation on how to use Dyspnea  scale       Expected Outcomes  Short Term: Able to use Dyspnea scale daily in rehab to express subjective sense of shortness of breath during exertion;Long Term: Able to use Dyspnea scale to guide intensity level when exercising independently       Knowledge and understanding of Target Heart Rate Range (THRR)  Yes       Intervention  Provide education and explanation of THRR including how the numbers were predicted and  where they are located for reference       Expected Outcomes  Short Term: Able to state/look up THRR;Long Term: Able to use THRR to govern intensity when exercising independently;Short Term: Able to use daily as guideline for intensity in rehab       Able to check pulse independently  Yes       Intervention  Provide education and demonstration on how to check pulse in carotid and radial arteries.;Review the importance of being able to check your own pulse for safety during independent exercise       Expected Outcomes  Short Term: Able to explain why pulse checking is important during independent exercise;Long Term: Able to check pulse independently and accurately       Understanding of Exercise Prescription  Yes       Intervention  Provide education, explanation, and written materials on patient's individual exercise prescription       Expected Outcomes  Short Term: Able to explain program exercise prescription;Long Term: Able to explain home exercise prescription to exercise independently          Exercise Goals Re-Evaluation : Exercise Goals Re-Evaluation    Row Name 02/02/18 1412 03/02/18 1457 03/23/18 1516 04/14/18 0829       Exercise Goal Re-Evaluation   Exercise Goals Review  Increase Physical Activity;Increase Strength and Stamina;Able to understand and use rate of perceived exertion (RPE) scale;Able to check pulse independently;Knowledge and understanding of Target Heart Rate Range (THRR);Able to understand and use Dyspnea scale;Understanding of Exercise Prescription   Increase Physical Activity;Increase Strength and Stamina;Able to understand and use rate of perceived exertion (RPE) scale;Able to check pulse independently;Knowledge and understanding of Target Heart Rate Range (THRR);Able to understand and use Dyspnea scale;Understanding of Exercise Prescription  Increase Physical Activity;Increase Strength and Stamina;Able to understand and use rate of perceived exertion (RPE) scale;Able to check pulse independently;Knowledge and understanding of Target Heart Rate Range (THRR);Able to understand and use Dyspnea scale;Understanding of Exercise Prescription  Increase Physical Activity;Increase Strength and Stamina;Able to understand and use rate of perceived exertion (RPE) scale;Able to check pulse independently;Knowledge and understanding of Target Heart Rate Range (THRR);Able to understand and use Dyspnea scale;Understanding of Exercise Prescription    Comments  Patient continues to do well in PR and has maintained his level on both the treadmill as well as thet nustep machine. Patient has also increased his SPMs on the nustep machine. Patient has still only just started the program and will be progressed more in time.   Patient is doing very well in PR. Patient has increased his speed on the treadmill to 1.7 and as well as maintaining his level and SPMs on the nustep machine. Patient has also lost weight since beginning the program. 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   Patient continues to do well in PR. patient has maintained his levels and speed on both of his machines. Patient has admitted to only smoking 1-2 cigarettes a day and has also stated that he has lost weight and is breathing better now since he started the priogram. Patient wishes to continue down this path.   Patient is doing very well in PR. patient has been working hard at his new job and has had much more energy and stamina for it. Patient has  also started taking chantix in order for him to quit smoking. Patient states that he feels stronger and seems to truly  be enjoying the program.     Expected Outcomes  Patient wishes to breathe better and to lose weight.   Patient wishes to breathe better and to lose weight.   Patient wishes to breathe better and to lose weight.   Patient wishes to breathe better and to lose weight.        Discharge Exercise Prescription (Final Exercise Prescription Changes): Exercise Prescription Changes - 04/06/18 1400      Response to Exercise   Blood Pressure (Admit)  108/62    Blood Pressure (Exercise)  124/70    Blood Pressure (Exit)  112/64    Heart Rate (Admit)  96 bpm    Heart Rate (Exercise)  113 bpm    Heart Rate (Exit)  95 bpm    Oxygen Saturation (Admit)  93 %    Oxygen Saturation (Exercise)  90 %    Oxygen Saturation (Exit)  96 %    Rating of Perceived Exertion (Exercise)  11    Perceived Dyspnea (Exercise)  11    Duration  Progress to 30 minutes of  aerobic without signs/symptoms of physical distress    Intensity  [facility] [phone]      Progression   Progression  Continue to progress workloads to maintain intensity without signs/symptoms of physical distress.      Resistance Training   Training Prescription  Yes    Weight  4    Reps  10-15      Treadmill   MPH  1.9    Grade  0    Minutes  15    METs  2.4      NuStep   Level  3    SPM  94    Minutes  20    METs  2.3      Home Exercise Plan   Plans to continue exercise at  Home (comment)    Frequency  Add 2 additional days to program exercise sessions.    Initial Home Exercises Provided  01/15/18       Nutrition:  Target Goals: Understanding of nutrition guidelines, daily intake of sodium 1500mg , cholesterol 200mg , calories 30% from fat and 7% or less from saturated fats, daily to have 5 or more servings of fruits and vegetables.  Biometrics: Pre Biometrics - 01/08/18 0947      Pre Biometrics   Height  5\' 7"   (1.702 m)    Weight  252 lb 6.4 oz (114.5 kg)    Waist Circumference  44.5 inches    Hip Circumference  42.5 inches    Waist to Hip Ratio  1.05 %    BMI (Calculated)  39.52    Triceps Skinfold  10 mm    % Body Fat  32.2 %    Grip Strength  75.06 kg    Flexibility  0 in    Single Leg Stand  3.5 seconds        Nutrition Therapy Plan and Nutrition Goals: Nutrition Therapy & Goals - 04/17/18 1429      Nutrition Therapy   RD appointment deferred  Yes      Personal Nutrition Goals   Personal Goal #2  Patient continues to work on eating more heart healthy. He is working with a dietician to lose weight.     Additional Goals?  No       Nutrition Assessments: Nutrition Assessments - 01/08/18 1128      MEDFICTS Scores   Pre Score  59  Nutrition Goals Re-Evaluation:   Nutrition Goals Discharge (Final Nutrition Goals Re-Evaluation):   Psychosocial: Target Goals: Acknowledge presence or absence of significant depression and/or stress, maximize coping skills, provide positive support system. Participant is able to verbalize types and ability to use techniques and skills needed for reducing stress and depression.  Initial Review & Psychosocial Screening: Initial Psych Review & Screening - 01/08/18 1132      Initial Review   Current issues with  None Identified      Family Dynamics   Concerns  No support system      Barriers   Psychosocial barriers to participate in program  There are no identifiable barriers or psychosocial needs.      Screening Interventions   Interventions  Encouraged to exercise    Expected Outcomes  Short Term goal: Identification and review with participant of any Quality of Life or Depression concerns found by scoring the questionnaire.;Long Term goal: The participant improves quality of Life and PHQ9 Scores as seen by post scores and/or verbalization of changes       Quality of Life Scores: Quality of Life - 01/08/18 1002      Quality of  Life Scores   Health/Function Pre  9.23 %    Socioeconomic Pre  14.17 %    Psych/Spiritual Pre  18.79 %    Family Pre  13.5 %    GLOBAL Pre  12.8 %      Scores of 19 and below usually indicate a poorer quality of life in these areas.  A difference of  2-3 points is a clinically meaningful difference.  A difference of 2-3 points in the total score of the Quality of Life Index has been associated with significant improvement in overall quality of life, self-image, physical symptoms, and general health in studies assessing change in quality of life.   PHQ-9: Recent Review Flowsheet Data    Depression screen [facility] 2/9 02/05/2018 01/08/2018 12/01/2017 09/15/2017 01/02/2017   Decreased Interest 0 0 0 0 0   Down, Depressed, Hopeless 0 1 0 0 0   PHQ - 2 Score 0 1 0 0 0   Altered sleeping - 0 - - 0   Tired, decreased energy - 1 - - 0   Change in appetite - 0 - - 0   Feeling bad or failure about yourself  - 0 - - 0   Trouble concentrating - 0 - - 0   Moving slowly or fidgety/restless - 0 - - 0   Suicidal thoughts - 0 - - 0   PHQ-9 Score - 2 - - 0   Difficult doing work/chores - Somewhat difficult - - -     Interpretation of Total Score  Total Score Depression Severity:  1-4 = Minimal depression, 5-9 = Mild depression, 10-14 = Moderate depression, 15-19 = Moderately severe depression, 20-27 = Severe depression   Psychosocial Evaluation and Intervention: Psychosocial Evaluation - 01/08/18 1134      Psychosocial Evaluation & Interventions   Interventions  Encouraged to exercise with the program and follow exercise prescription    Comments  Patients QOL scores where low. We discussed his scores and he said his biggest problems is due to SOB he can't do the activities that he used to do and that is what is bothering him.     Continue Psychosocial Services   Follow up required by staff       Psychosocial Re-Evaluation: Psychosocial Re-Evaluation    [place] Name 02/05/18 1423 03/05/18  1500 03/26/18  1551 04/17/18 1434       Psychosocial Re-Evaluation   Current issues with  None Identified  None Identified  None Identified  None Identified    Comments  Patient's initial QOL was 12.80 and his PHQ-9 score was 2 indicating depression. He says he is not depressed. He scored losest on health/function and family. He does not have a good family support system.   Patient's initial QOL was 12.80 and his PHQ-9 score was 2 indicating depression. He says he is not depressed. He scored losest on health/function and family. He does not have a good family support system.   Patient's initial QOL was 12.80 and his PHQ-9 score was 2 indicating depression. He says he is not depressed. He scored losest on health/function and family. He does not have a good family support system.   Patient's initial QOL was 12.80 and his PHQ-9 score was 2 indicating depression. He says he is not depressed. He scored losest on health/function and family. He does not have a good family support system.     Expected Outcomes  Patient's QOL and PHQ-9 score will improve at discharge and he will have no additional psychosocial issues identified.   Patient's QOL and PHQ-9 score will improve at discharge and he will have no additional psychosocial issues identified.   Patient's QOL and PHQ-9 score will improve at discharge and he will have no additional psychosocial issues identified.   Patient's QOL and PHQ-9 score will improve at discharge and he will have no additional psychosocial issues identified.     Interventions  Stress management education;Relaxation education;Encouraged to attend Pulmonary Rehabilitation for the exercise  Stress management education;Relaxation education;Encouraged to attend Pulmonary Rehabilitation for the exercise  Stress management education;Relaxation education;Encouraged to attend Pulmonary Rehabilitation for the exercise  Stress management education;Relaxation education;Encouraged to attend Pulmonary Rehabilitation for  the exercise    Continue Psychosocial Services   No Follow up required  No Follow up required  No Follow up required  No Follow up required       Psychosocial Discharge (Final Psychosocial Re-Evaluation): Psychosocial Re-Evaluation - 04/17/18 1434      Psychosocial Re-Evaluation   Current issues with  None Identified    Comments  Patient's initial QOL was 12.80 and his PHQ-9 score was 2 indicating depression. He says he is not depressed. He scored losest on health/function and family. He does not have a good family support system.     Expected Outcomes  Patient's QOL and PHQ-9 score will improve at discharge and he will have no additional psychosocial issues identified.     Interventions  Stress management education;Relaxation education;Encouraged to attend Pulmonary Rehabilitation for the exercise    Continue Psychosocial Services   No Follow up required        Education: Education Goals: Education classes will be provided on a weekly basis, covering required topics. Participant will state understanding/return demonstration of topics presented.  Learning Barriers/Preferences: Learning Barriers/Preferences - 01/08/18 1031      Learning Barriers/Preferences   Learning Barriers  None    Learning Preferences  Video;Written Material;Pictoral       Education Topics: How Lungs Work and Diseases: - Discuss the anatomy of the lungs and diseases that can affect the lungs, such as COPD.   PULMONARY REHAB CHRONIC OBSTRUCTIVE PULMONARY DISEASE from 04/09/2018 in [place]  Date  03/12/18  Educator  D. Coad  Instruction Review Code  2- Demonstrated Understanding      Exercise: -  Discuss the importance of exercise, FITT principles of exercise, normal and abnormal responses to exercise, and how to exercise safely.   PULMONARY REHAB CHRONIC OBSTRUCTIVE PULMONARY DISEASE from 04/09/2018 in [place]  Date  03/05/18  Educator  [place]  Instruction  Review Code  2- Demonstrated Understanding      Environmental Irritants: -Discuss types of environmental irritants and how to limit exposure to environmental irritants.   Meds/Inhalers and oxygen: - Discuss respiratory medications, definition of an inhaler and oxygen, and the proper way to use an inhaler and oxygen.   PULMONARY REHAB CHRONIC OBSTRUCTIVE PULMONARY DISEASE from 04/09/2018 in [place]  Date  03/26/18  Educator  DC      Energy Saving Techniques: - Discuss methods to conserve energy and decrease shortness of breath when performing activities of daily living.    PULMONARY REHAB CHRONIC OBSTRUCTIVE PULMONARY DISEASE from 04/09/2018 in [place]  Date  04/02/18  Educator  [place]  Instruction Review Code  2- Demonstrated Understanding      Bronchial Hygiene / Breathing Techniques: - Discuss breathing mechanics, pursed-lip breathing technique,  proper posture, effective ways to clear airways, and other functional breathing techniques   PULMONARY REHAB CHRONIC OBSTRUCTIVE PULMONARY DISEASE from 04/09/2018 in [place]  Date  04/09/18  Educator  DC  Instruction Review Code  2- Demonstrated Understanding      Cleaning Equipment: - Provides group verbal and written instruction about the health risks of elevated stress, cause of high stress, and healthy ways to reduce stress.   PULMONARY REHAB CHRONIC OBSTRUCTIVE PULMONARY DISEASE from 04/09/2018 in [place]  Date  01/15/18  Educator  [place]  Instruction Review Code  2- Demonstrated Understanding      Nutrition I: Fats: - Discuss the types of cholesterol, what cholesterol does to the body, and how cholesterol levels can be controlled.   PULMONARY REHAB CHRONIC OBSTRUCTIVE PULMONARY DISEASE from 04/09/2018 in [place]  Date  01/22/18  Educator  [place]  Instruction Review Code  2- Demonstrated Understanding       Nutrition II: Labels: -Discuss the different components of food labels and how to read food labels.   PULMONARY REHAB CHRONIC OBSTRUCTIVE PULMONARY DISEASE from 04/09/2018 in [place]  Date  01/29/18  Educator  DJ  Instruction Review Code  2- Demonstrated Understanding      Respiratory Infections: - Discuss the signs and symptoms of respiratory infections, ways to prevent respiratory infections, and the importance of seeking medical treatment when having a respiratory infection.   PULMONARY REHAB CHRONIC OBSTRUCTIVE PULMONARY DISEASE from 04/09/2018 in [place]  Date  02/05/18  Educator  [place]  Instruction Review Code  2- Demonstrated Understanding      Stress I: Signs and Symptoms: - Discuss the causes of stress, how stress may lead to anxiety and depression, and ways to limit stress.   Stress II: Relaxation: -Discuss relaxation techniques to limit stress.   PULMONARY REHAB CHRONIC OBSTRUCTIVE PULMONARY DISEASE from 04/09/2018 in [place]  Date  02/19/18  Educator  [place]  Instruction Review Code  2- Demonstrated Understanding      Oxygen for Home/Travel: - Discuss how to prepare for travel when on oxygen and proper ways to transport and store oxygen to ensure safety.   Knowledge Questionnaire Score: Knowledge Questionnaire Score - 01/08/18 1110      Knowledge Questionnaire Score   Pre Score  15/18  Core Components/Risk Factors/Patient Goals at Admission: Personal Goals and Risk Factors at Admission - 01/08/18 1128      Core Components/Risk Factors/Patient Goals on Admission    Weight Management  Yes    Intervention  Weight Management/Obesity: Establish reasonable short term and long term weight goals.;Obesity: Provide education and appropriate resources to help participant work on and attain dietary goals.    Admit Weight  252 lb 6.4 oz (114.5 kg)    Goal Weight: Short Term  244 lb 14.4 oz  (111.1 kg)    Goal Weight: Long Term  237 lb 6.4 oz (107.7 kg)    Expected Outcomes  Short Term: Continue to assess and modify interventions until short term weight is achieved;Long Term: Adherence to nutrition and physical activity/exercise program aimed toward attainment of established weight goal    Improve shortness of breath with ADL's  Yes    Intervention  Provide education, individualized exercise plan and daily activity instruction to help decrease symptoms of SOB with activities of daily living.    Expected Outcomes  Short Term: Improve cardiorespiratory fitness to achieve a reduction of symptoms when performing ADLs;Long Term: Be able to perform more ADLs without symptoms or delay the onset of symptoms    Personal Goal Other  Yes    Personal Goal  Breathe better, Lose 15lbs while in program    Intervention  Attend class 2 x week and supplement with home exercise 3 x week    Expected Outcomes  Reach personal goals       Core Components/Risk Factors/Patient Goals Review:  Goals and Risk Factor Review    Row Name 02/05/18 1408 03/05/18 1455 03/26/18 1547 04/17/18 1429       Core Components/Risk Factors/Patient Goals Review   Personal Goals Review  Weight Management/Obesity;Improve shortness of breath with ADL's;Diabetes Breathe better; lose weight 15 lbs; do regular ADL's.   Weight Management/Obesity;Improve shortness of breath with ADL's;Diabetes Breathe better; lose weight 15 lbs; do regular ADL's.   Weight Management/Obesity;Improve shortness of breath with ADL's;Diabetes;Tobacco Cessation Breathe better; lose weight 15 lbs; do regular ADL's.  Weight Management/Obesity;Improve shortness of breath with ADL's;Diabetes;Tobacco Cessation Brethe better; lose weight 15 lbs; do regular ADL's.     Review  Patient has completed 7 sessions losing 6.1 lbs. He is doing well in the program with some progression. He says it is too soon to tell a difference yet in the program but hopes as he continues  the program he will meet his personal goals.   Patient has completed 13 sessions losing 4.5 lb overall. He continues to do well in the program with progression. His last A1C in [facility] was 6.2 10/14/17. He says the program is helping him a lot. He is able to do more things now without fatigue. He is able to work his job and go home and do things around the house. He has more strength and energy. Will continue to monitor for progress.   Patient has completed 18 sessions losing 5.4 lbs since he started the program. He continues to do well in the program. No recent Hgb A1C readings. He continues to say he is breathing better and he is pleased wtih his weight loss. He continues to be able to work without difficulty and is doing more at home. He has recently started Chantix to try and quit smoking. He is down to 2 cigarettes per day now. Will continue to monitor for progress.   Patient has completed 22 lbs losing 2 lbs since  last 30 day review. He continues to do well in the program with progression. No recent hgb A1C readings recorded. He continues to say he is breathing better. He continues to be happy about his weight loss. He is no longer working because his employer went out of business. He continues on Chantix  and is down to 1 cigarette per day. Will continue to monitor for progress.     Expected Outcomes  Patient will continue to attend sessions and complete the program and meet his personal goals.   Patient will continue to attend sessions and complete the program and meet his personal goals.   Patient will continue to attend sessions and complete the program and meet his personal goals.   Patient will continue to attend sessions and complete the program and meet his personal goals.        Core Components/Risk Factors/Patient Goals at Discharge (Final Review):  Goals and Risk Factor Review - 04/17/18 1429      Core Components/Risk Factors/Patient Goals Review   Personal Goals Review  Weight  Management/Obesity;Improve shortness of breath with ADL's;Diabetes;Tobacco Cessation Brethe better; lose weight 15 lbs; do regular ADL's.     Review  Patient has completed 22 lbs losing 2 lbs since last 30 day review. He continues to do well in the program with progression. No recent hgb A1C readings recorded. He continues to say he is breathing better. He continues to be happy about his weight loss. He is no longer working because his employer went out of business. He continues on Chantix  and is down to 1 cigarette per day. Will continue to monitor for progress.     Expected Outcomes  Patient will continue to attend sessions and complete the program and meet his personal goals.        ITP Comments: ITP Comments    Row Name 01/08/18 1118           ITP Comments  Mr. Prehn is a pleasant 57 year old African American male. He has not been in our program before. He is eager to get started. Wants to get stronger so that he can get back to his regular activites.           Comments: ITP 30 Day REVIEW Pt is making expected progress toward pulmonary rehab goals after completing 22 sessions. Recommend continued exercise, life style modification, education, and utilization of breathing techniques to increase stamina and strength and decrease shortness of breath with exertion.

## 2018-04-21 ENCOUNTER — Encounter (HOSPITAL_COMMUNITY): Payer: Medicare Other

## 2018-04-21 ENCOUNTER — Encounter (HOSPITAL_COMMUNITY)
Admission: RE | Admit: 2018-04-21 | Discharge: 2018-04-21 | Disposition: A | Discharge status: Home / Self Care | Payer: Medicare Other | Source: Ambulatory Visit | Attending: Cardiovascular Disease | Admitting: Cardiovascular Disease

## 2018-04-21 DIAGNOSIS — G4733 Obstructive sleep apnea (adult) (pediatric): Secondary | ICD-10-CM | POA: Diagnosis not present

## 2018-04-21 DIAGNOSIS — Z9989 Dependence on other enabling machines and devices: Secondary | ICD-10-CM | POA: Diagnosis not present

## 2018-04-21 DIAGNOSIS — J449 Chronic obstructive pulmonary disease, unspecified: Secondary | ICD-10-CM

## 2018-04-21 NOTE — Progress Notes (Signed)
Daily Session Note  Patient Details  Name: Ricardo Burch MRN: [id number] Date of Birth: 02/14/1961 Referring Provider:     PULMONARY REHAB COPD ORIENTATION from 01/08/2018 in [place] [facility]  Referring Provider  DR. Koneswaran      Encounter Date: 04/21/2018  Check In: Session Check In - 04/21/18 1401      Check-In   Location  [facility]    Staff Present  Diane Coad, MS, EP, CH[facility], Exercise Physiologist;Ameilia Rattan, BS, EP, Exercise Physiologist    Supervising physician immediately available to respond to emergencies  See telemetry face sheet for immediately available MD    Medication changes reported      No    Fall or balance concerns reported     No    Tobacco Cessation  Use Decreased    Warm-up and Cool-down  Performed as group-led instruction    Resistance Training Performed  Yes    VAD Patient?  No      Pain Assessment   Currently in Pain?  No/denies    Pain Score  0-No pain    Multiple Pain Sites  No       Capillary Blood Glucose: No results found for this or any previous visit (from the past 24 hour(s)).    Social History   Tobacco Use  Smoking Status Light Tobacco Smoker  . Packs/day: 0.25  . Years: 38.00  . Pack years: 9.50  . Types: Cigarettes  Smokeless Tobacco Never Used    Goals Met:  Independence with exercise equipment Improved SOB with ADL's Using PLB without cueing & demonstrates good technique Exercise tolerated well No report of cardiac concerns or symptoms Strength training completed today  Goals Unmet:  Not Applicable  Comments: Check out 230   Dr. Edward Hawkins is Medical Director for [facility]. 

## 2018-04-23 ENCOUNTER — Encounter (HOSPITAL_COMMUNITY): Payer: Medicare Other

## 2018-04-23 ENCOUNTER — Encounter (HOSPITAL_COMMUNITY)
Admission: RE | Admit: 2018-04-23 | Discharge: 2018-04-23 | Disposition: A | Discharge status: Home / Self Care | Payer: Medicare Other | Source: Ambulatory Visit | Attending: Cardiovascular Disease | Admitting: Cardiovascular Disease

## 2018-04-23 VITALS — Ht 67.0 in | Wt 245.2 lb

## 2018-04-23 DIAGNOSIS — J449 Chronic obstructive pulmonary disease, unspecified: Secondary | ICD-10-CM

## 2018-04-23 DIAGNOSIS — G4733 Obstructive sleep apnea (adult) (pediatric): Secondary | ICD-10-CM | POA: Diagnosis not present

## 2018-04-23 DIAGNOSIS — Z9989 Dependence on other enabling machines and devices: Secondary | ICD-10-CM | POA: Diagnosis not present

## 2018-04-23 NOTE — Progress Notes (Signed)
Daily Session Note  Patient Details  Name: Lenville Hibberd MRN: [id number] Date of Birth: 16-Aug-1961 Referring Provider:     PULMONARY REHAB COPD ORIENTATION from 01/08/2018 in [place]  Referring Provider  DR. [name]      Encounter Date: 04/23/2018   Check In: Session Check In - 04/23/18 1330      Check-In   Location  [facility]    Staff Present  Diane Angelina Pih, MS, EP, [facility], Exercise Physiologist;Meryem Haertel Luther Parody, BS, EP, Exercise Physiologist;Debra Wynetta Emery, RN, BSN    Supervising physician immediately available to respond to emergencies  See telemetry face sheet for immediately available MD    Medication changes reported      No    Fall or balance concerns reported     No    Tobacco Cessation  Use Decreased    Warm-up and Cool-down  Performed as group-led instruction    Resistance Training Performed  Yes    VAD Patient?  No      Pain Assessment   Currently in Pain?  No/denies    Pain Score  0-No pain    Multiple Pain Sites  No       Capillary Blood Glucose: No results found for this or any previous visit (from the past 24 hour(s)).  Exercise Prescription Changes - 04/22/18 1400      Response to Exercise   Blood Pressure (Admit)  128/80    Blood Pressure (Exercise)  134/82    Blood Pressure (Exit)  130/74    Heart Rate (Admit)  94 bpm    Heart Rate (Exercise)  108 bpm    Heart Rate (Exit)  94 bpm    Oxygen Saturation (Admit)  93 %    Oxygen Saturation (Exercise)  93 %    Oxygen Saturation (Exit)  95 %    Rating of Perceived Exertion (Exercise)  10    Perceived Dyspnea (Exercise)  10    Duration  Progress to 30 minutes of  aerobic without signs/symptoms of physical distress    Intensity  [facility] [phone]      Progression   Progression  Continue to progress workloads to maintain intensity without signs/symptoms of physical distress.      Resistance Training   Training Prescription  Yes    Weight  5    Reps  10-15      Treadmill   MPH  2.1    Grade  0    Minutes  15    METs  2.6      NuStep   Level  3    SPM  113    Minutes  20    METs  3.1      Home Exercise Plan   Plans to continue exercise at  Home (comment)    Frequency  Add 2 additional days to program exercise sessions.    Initial Home Exercises Provided  01/15/18       Social History   Tobacco Use  Smoking Status Light Tobacco Smoker  . Packs/day: 0.25  . Years: 38.00  . Pack years: 9.50  . Types: Cigarettes  Smokeless Tobacco Never Used    Goals Met:  Independence with exercise equipment Improved SOB with ADL's Using PLB without cueing & demonstrates good technique Exercise tolerated well No report of cardiac concerns or symptoms Strength training completed today  Goals Unmet:  Not Applicable  Comments: Check out 230   Dr. Sinda Du is Medical Director for  [facility].

## 2018-04-28 ENCOUNTER — Other Ambulatory Visit: Payer: Self-pay

## 2018-04-28 ENCOUNTER — Encounter (HOSPITAL_COMMUNITY): Payer: Medicare Other

## 2018-04-28 ENCOUNTER — Encounter (HOSPITAL_COMMUNITY): Payer: Self-pay

## 2018-04-28 ENCOUNTER — Emergency Department (HOSPITAL_COMMUNITY)
Admission: EM | Admit: 2018-04-28 | Discharge: 2018-04-28 | Disposition: A | Discharge status: Home / Self Care | Payer: Medicare Other | Attending: Emergency Medicine | Admitting: Emergency Medicine

## 2018-04-28 DIAGNOSIS — Z79899 Other long term (current) drug therapy: Secondary | ICD-10-CM | POA: Diagnosis not present

## 2018-04-28 DIAGNOSIS — I509 Heart failure, unspecified: Secondary | ICD-10-CM | POA: Diagnosis not present

## 2018-04-28 DIAGNOSIS — Y929 Unspecified place or not applicable: Secondary | ICD-10-CM | POA: Insufficient documentation

## 2018-04-28 DIAGNOSIS — F17210 Nicotine dependence, cigarettes, uncomplicated: Secondary | ICD-10-CM | POA: Insufficient documentation

## 2018-04-28 DIAGNOSIS — L538 Other specified erythematous conditions: Secondary | ICD-10-CM | POA: Diagnosis present

## 2018-04-28 DIAGNOSIS — J449 Chronic obstructive pulmonary disease, unspecified: Secondary | ICD-10-CM | POA: Diagnosis not present

## 2018-04-28 DIAGNOSIS — N189 Chronic kidney disease, unspecified: Secondary | ICD-10-CM | POA: Diagnosis not present

## 2018-04-28 DIAGNOSIS — Z7982 Long term (current) use of aspirin: Secondary | ICD-10-CM | POA: Insufficient documentation

## 2018-04-28 DIAGNOSIS — S40861A Insect bite (nonvenomous) of right upper arm, initial encounter: Secondary | ICD-10-CM | POA: Insufficient documentation

## 2018-04-28 DIAGNOSIS — Y939 Activity, unspecified: Secondary | ICD-10-CM | POA: Insufficient documentation

## 2018-04-28 DIAGNOSIS — W57XXXA Bitten or stung by nonvenomous insect and other nonvenomous arthropods, initial encounter: Secondary | ICD-10-CM | POA: Diagnosis not present

## 2018-04-28 DIAGNOSIS — Y999 Unspecified external cause status: Secondary | ICD-10-CM | POA: Insufficient documentation

## 2018-04-28 DIAGNOSIS — I130 Hypertensive heart and chronic kidney disease with heart failure and stage 1 through stage 4 chronic kidney disease, or unspecified chronic kidney disease: Secondary | ICD-10-CM | POA: Insufficient documentation

## 2018-04-28 MED ORDER — DIPHENHYDRAMINE HCL 25 MG PO CAPS
25.0000 mg | ORAL_CAPSULE | Freq: Once | ORAL | Status: AC
  Administered 2018-04-28: 25 mg via ORAL
  Filled 2018-04-28: qty 1

## 2018-04-28 NOTE — ED Triage Notes (Signed)
Pt reports woke up with itching and swelling to r elbow.  Pt thinks something bit him last night.

## 2018-04-28 NOTE — Discharge Instructions (Signed)
It was our pleasure to provide your ER care today - we hope that you feel better.  You may apply topical anti-itch medication as need (available at [facility], [facility], [place], etc).

## 2018-04-28 NOTE — ED Provider Notes (Signed)
[facility] EMERGENCY DEPARTMENT Provider Note   CSN: [id number] Arrival date & time: 04/28/18  0932     History   Chief Complaint Chief Complaint  Patient presents with  . Insect Bite    HPI Ricardo Burch is a 57 y.o. male.  Patient c/o insect bites to right arm last night. Last two Ran erythematous circular lesions approximately .5 cm c/w insect bite/sting. Areas are itchy, mild, constnt.  No generalized rash or other skin lesions. Did not see the bug/insect. Denies feeling ill or sick. No fevers. No spreading redness.   The history is provided by the patient.    Past Medical History:  Diagnosis Date  . Allergy   . CHF (congestive heart failure) ([place])   . CHF (congestive heart failure) ([place])   . Chronic kidney disease   . COPD (chronic obstructive pulmonary disease) ([place]) Dx 2015  . Depression   . Eczema    since childhood   . Hyperlipidemia 01/12/2018  . Hypertension   . Oxygen deficiency   . Sleep apnea    CPAP  . Substance abuse ([place])    MJ, cocaine    Patient Active Problem List   Diagnosis Date Noted  . Premature ejaculation 01/12/2018  . Hyperlipidemia 01/12/2018  . Hypertension 10/14/2017  . Aortic atherosclerosis ([place]) 09/15/2017  . Pre-diabetes 09/15/2017  . Cocaine abuse ([place]) 07/26/2016  . Depression 07/26/2016  . OSA on CPAP 07/26/2016  . CHF (congestive heart failure) ([place]) 06/27/2016  . Alcohol abuse 06/27/2016  . COPD (chronic obstructive pulmonary disease) ([place]) 08/23/2014  . Eczema 08/23/2014  . Right knee pain 08/23/2014  . Tobacco abuse 03/27/2014    Past Surgical History:  Procedure Laterality Date  . COLONOSCOPY WITH PROPOFOL N/A 06/25/2017   Procedure: COLONOSCOPY WITH PROPOFOL;  Surgeon: Daneil Dolin, MD;  [facility];  Service: Endoscopy;  Laterality: N/A;  2:00pm  . MULTIPLE EXTRACTIONS WITH ALVEOLOPLASTY N/A 03/22/2016   Procedure: MULTIPLE EXTRACTION WITH ALVEOLOPLASTY;  Surgeon: Diona Browner, DDS;  Location: [place];  Service: Oral Surgery;  Laterality: N/A;  . NO PAST SURGERIES    . POLYPECTOMY  06/25/2017   Procedure: POLYPECTOMY;  Surgeon: Daneil Dolin, MD;  [facility];  Service: Endoscopy;;  colon        Home Medications    Prior to Admission medications   Medication Sig Start Date End Date Taking? Authorizing Provider  amLODipine (NORVASC) 5 MG tablet Take 1 tablet (5 mg total) by mouth daily. 10/14/17   Raylene Everts, MD  ANORO ELLIPTA 62.5-25 MCG/INH AEPB INHALE 1 PUFF INTO THE LUNGS DAILY. 03/20/18   Marshell Garfinkel, MD  aspirin EC 81 MG tablet Take 1 tablet (81 mg total) by mouth daily. 09/20/15   Florencia Reasons, MD  DALIRESP 500 MCG TABS tablet Take 1 tablet (500 mcg total) by mouth daily. 10/23/17   Magdalen Spatz, NP  diphenhydrAMINE (BENADRYL) 25 MG tablet Take 1 tablet (25 mg total) by mouth every 6 (six) hours. As needed for itching 04/07/18   Triplett, Tammy, PA-C  Guaifenesin (MUCINEX MAXIMUM STRENGTH) 1200 MG TB12 Take 1,200 mg by mouth daily.    [provider]  OXYGEN Inhale 2 L into the lungs continuous.    [provider]  sertraline (ZOLOFT) 25 MG tablet Take 1 tablet (25 mg total) by mouth daily. 01/12/18   Raylene Everts, MD  triamcinolone cream (KENALOG) 0.1 % Apply 1 application topically 3 (three) times daily. Apply  to affected areas 04/07/18   Triplett, Tammy, PA-C  varenicline (CHANTIX STARTING MONTH PAK) 0.5 MG X 11 & 1 MG X 42 tablet Take 1 0.5 mg tablet daily for 3 days, then increase to 1 0.5 mg tablet twice daily for 4 days, then increase to 1 1 mg tablet twice daily. 03/18/18   Mannam, Hart Robinsons, MD  VENTOLIN HFA 108 (90 Base) MCG/ACT inhaler Inhale 1-2 puffs into the lungs every 6 (six) hours as needed for wheezing or shortness of breath. (200/12=17) 12/31/17   Marshell Garfinkel, MD    Family History Family History  Problem Relation Age of Onset  . Arthritis Mother   . Hypertension Mother   . Stroke Mother        age 68  . Cerebral  aneurysm Father   . Alcohol abuse Father   . Cancer Brother   . Diabetes Neg Hx   . Heart disease Neg Hx     Social History Social History   Tobacco Use  . Smoking status: Light Tobacco Smoker    Packs/day: 0.25    Years: 38.00    Pack years: 9.50    Types: Cigarettes  . Smokeless tobacco: Never Used  Substance Use Topics  . Alcohol use: Yes    Alcohol/week: 0.6 oz    Types: 1 Cans of beer per week    Comment: occassionally: weekends, 6-pack or less on a day; or half pint of liquior  . Drug use: Yes    Types: Marijuana, Cocaine    Comment: not current     Allergies   Apresoline [hydralazine]   Review of Systems Review of Systems  Constitutional: Negative for chills and fever.  Respiratory: Negative for shortness of breath.   Skin:       Insect bites     Physical Exam Updated Vital Signs BP 123/82 (BP Location: Right Arm)   Pulse 85   Temp 97.8 F (36.6 C) (Oral)   Resp 20   Ht 1.702 m (5\' 7" )   Wt 111.1 kg (245 lb)   SpO2 92%   BMI 38.37 kg/m   Physical Exam  Constitutional: He is oriented to person, place, and time. He appears well-developed and well-nourished.  HENT:  Mouth/Throat: Oropharynx is clear and moist.  No mm lesions.  Eyes: Conjunctivae are normal.  Neck: Neck supple. No tracheal deviation present.  Cardiovascular: Normal rate and intact distal pulses.  Pulmonary/Chest: Effort normal. No accessory muscle usage. No respiratory distress. He has no wheezes.  Musculoskeletal: He exhibits no edema.  Neurological: He is alert and oriented to person, place, and time.  Skin: Skin is warm and dry.  Two [place]iana, subcentimeter, circular, erythematous lesions to right arm c/w insect bite or sting. Mild swelling to area. No spreading redness/cellulitis.   Psychiatric: He has a normal mood and affect.  Nursing note and vitals reviewed.    ED Treatments / Results  Labs (all labs ordered are listed, but only abnormal results are displayed) Labs  Reviewed - No data to display  EKG None  Radiology No results found.  Procedures Procedures (including critical care time)  Medications Ordered in ED Medications  diphenhydrAMINE (BENADRYL) capsule 25 mg (has no administration in time range)     Initial Impression / Assessment and Plan / ED Course  I have reviewed the triage vital signs and the nursing notes.  Pertinent labs & imaging results that were available during my care of the patient were reviewed by me and considered in  my medical decision making (see chart for details).  Pt has two areas c/w insect bite without superimposed cellulitis. No generalized reactions.   Reviewed nursing notes and prior charts for additional history.   No meds pta today. Benadryl po.  Pt appears stable for d/c.     Final Clinical Impressions(s) / ED Diagnoses   Final diagnoses:  None    ED Discharge Orders    None       Lajean Saver, MD 04/28/18 [phone]

## 2018-04-29 NOTE — Progress Notes (Signed)
Discharge Progress Report  Patient Details  Name: Ricardo Burch MRN: [id number] Date of Birth: [birth year] Referring Provider:     PULMONARY REHAB COPD ORIENTATION from 01/08/2018 in [place]  Referring Provider  DR. Koneswaran       Number of Visits: 24  Reason for Discharge:  Patient reached a stable level of exercise. Patient independent in their exercise. Patient has met program and personal goals.  Smoking History:  Social History   Tobacco Use  Smoking Status Light Tobacco Smoker  . Packs/day: 0.25  . Years: 38.00  . Pack years: 9.50  . Types: Cigarettes  Smokeless Tobacco Never Used    Diagnosis:  Chronic obstructive pulmonary disease, unspecified COPD type ([place])  ADL UCSD: Pulmonary Assessment Scores    Row Name 01/08/18 1116 04/29/18 1435       ADL UCSD   ADL Phase  Entry  Exit    SOB Score total  31  23    Rest  0  0    Walk  3  5    Stairs  4  4    Bath  1  1    Dress  0  2    Shop  1  1      CAT Score   CAT Score  23  15      mMRC Score   mMRC Score  2  1       Initial Exercise Prescription: Initial Exercise Prescription - 01/08/18 0900      Date of Initial Exercise RX and Referring Provider   Date  01/08/18    Referring Provider  DR. Koneswaran      Treadmill   MPH  1.3    Grade  0    Minutes  15    METs  1.9      NuStep   Level  2    SPM  55    Minutes  20    METs  1.8      Prescription Details   Frequency (times per week)  3    Duration  Progress to 30 minutes of continuous aerobic without signs/symptoms of physical distress      Intensity   THRR 40-80% of Max Heartrate  [phone]    Ratings of Perceived Exertion  11-13    Perceived Dyspnea  0-4      Progression   Progression  Continue progressive overload as per policy without signs/symptoms or physical distress.      Resistance Training   Training Prescription  Yes    Weight  1    Reps  10-15       Discharge Exercise Prescription  (Final Exercise Prescription Changes): Exercise Prescription Changes - 04/22/18 1400      Response to Exercise   Blood Pressure (Admit)  128/80    Blood Pressure (Exercise)  134/82    Blood Pressure (Exit)  130/74    Heart Rate (Admit)  94 bpm    Heart Rate (Exercise)  108 bpm    Heart Rate (Exit)  94 bpm    Oxygen Saturation (Admit)  93 %    Oxygen Saturation (Exercise)  93 %    Oxygen Saturation (Exit)  95 %    Rating of Perceived Exertion (Exercise)  10    Perceived Dyspnea (Exercise)  10    Duration  Progress to 30 minutes of  aerobic without signs/symptoms of physical distress    Intensity  THRR New [phone]  Progression   Progression  Continue to progress workloads to maintain intensity without signs/symptoms of physical distress.      Resistance Training   Training Prescription  Yes    Weight  5    Reps  10-15      Treadmill   MPH  2.1    Grade  0    Minutes  15    METs  2.6      NuStep   Level  3    SPM  113    Minutes  20    METs  3.1      Home Exercise Plan   Plans to continue exercise at  Home (comment)    Frequency  Add 2 additional days to program exercise sessions.    Initial Home Exercises Provided  01/15/18       Functional Capacity: 6 Minute Walk    Row Name 01/08/18 0943 04/29/18 1411       6 Minute Walk   Phase  Initial  Discharge    Distance  1100 feet  1250 feet    Distance % Change  0 %  13.64 %    Distance Feet Change  0 ft  150 ft    Walk Time  6 minutes  6 minutes    # of Rest Breaks  0  0    MPH  2.08  2.36    METS  2.59  2.08    RPE  11  13    Perceived Dyspnea   14  13    VO2 Peak  10.63  12.17    Symptoms  No  No    Resting HR  91 bpm  91 bpm    Resting BP  124/90  126/76    Resting Oxygen Saturation   92 %  92 %    Exercise Oxygen Saturation  during 6 min walk  82 %  79 %    Max Ex. HR  112 bpm  119 bpm    Max Ex. BP  154/96  158/86    2 Minute Post BP  126/88  138/80       Psychological, QOL, Others -  Outcomes: PHQ 2/9: Depression screen [facility] 2/9 04/29/2018 02/05/2018 01/08/2018 12/01/2017 09/15/2017  Decreased Interest 0 0 0 0 0  Down, Depressed, Hopeless 0 0 1 0 0  PHQ - 2 Score 0 0 1 0 0  Altered sleeping 0 - 0 - -  Tired, decreased energy - - 1 - -  Change in appetite 0 - 0 - -  Feeling bad or failure about yourself  1 - 0 - -  Trouble concentrating 0 - 0 - -  Moving slowly or fidgety/restless 0 - 0 - -  Suicidal thoughts 0 - 0 - -  PHQ-9 Score 1 - 2 - -  Difficult doing work/chores - - Somewhat difficult - -    Quality of Life: Quality of Life - 04/29/18 1413      Quality of Life   Select  Quality of Life      Quality of Life Scores   Health/Function Pre  9.23 %    Health/Function Post  21.63 %    Health/Function % Change  134.34 %    Socioeconomic Pre  14.17 %    Socioeconomic Post  23.57 %    Socioeconomic % Change   66.34 %    Psych/Spiritual Pre  18.79 %    Psych/Spiritual Post  24.79 %  Psych/Spiritual % Change  31.93 %    Family Pre  13.5 %    Family Post  24 %    Family % Change  77.78 %    GLOBAL Pre  12.8 %    GLOBAL Post  22.92 %    GLOBAL % Change  79.06 %       Personal Goals: Goals established at orientation with interventions provided to work toward goal. Personal Goals and Risk Factors at Admission - 01/08/18 1128      Core Components/Risk Factors/Patient Goals on Admission    Weight Management  Yes    Intervention  Weight Management/Obesity: Establish reasonable short term and long term weight goals.;Obesity: Provide education and appropriate resources to help participant work on and attain dietary goals.    Admit Weight  252 lb 6.4 oz (114.5 kg)    Goal Weight: Short Term  244 lb 14.4 oz (111.1 kg)    Goal Weight: Long Term  237 lb 6.4 oz (107.7 kg)    Expected Outcomes  Short Term: Continue to assess and modify interventions until short term weight is achieved;Long Term: Adherence to nutrition and physical activity/exercise program aimed toward  attainment of established weight goal    Improve shortness of breath with ADL's  Yes    Intervention  Provide education, individualized exercise plan and daily activity instruction to help decrease symptoms of SOB with activities of daily living.    Expected Outcomes  Short Term: Improve cardiorespiratory fitness to achieve a reduction of symptoms when performing ADLs;Long Term: Be able to perform more ADLs without symptoms or delay the onset of symptoms    Personal Goal Other  Yes    Personal Goal  Breathe better, Lose 15lbs while in program    Intervention  Attend class 2 x week and supplement with home exercise 3 x week    Expected Outcomes  Reach personal goals        Personal Goals Discharge: Goals and Risk Factor Review    Row Name 02/05/18 1408 03/05/18 1455 03/26/18 1547 04/17/18 1429 04/29/18 1436     Core Components/Risk Factors/Patient Goals Review   Personal Goals Review  Weight Management/Obesity;Improve shortness of breath with ADL's;Diabetes Breathe better; lose weight 15 lbs; do regular ADL's.   Weight Management/Obesity;Improve shortness of breath with ADL's;Diabetes Breathe better; lose weight 15 lbs; do regular ADL's.   Weight Management/Obesity;Improve shortness of breath with ADL's;Diabetes;Tobacco Cessation Breathe better; lose weight 15 lbs; do regular ADL's.  Weight Management/Obesity;Improve shortness of breath with ADL's;Diabetes;Tobacco Cessation Brethe better; lose weight 15 lbs; do regular ADL's.   Weight Management/Obesity;Improve shortness of breath with ADL's;Diabetes;Tobacco Cessation Breathe better; do regular ADL's; lose weight 15 lbs.    Review  Patient has completed 7 sessions losing 6.1 lbs. He is doing well in the program with some progression. He says it is too soon to tell a difference yet in the program but hopes as he continues the program he will meet his personal goals.   Patient has completed 13 sessions losing 4.5 lb overall. He continues to do well in  the program with progression. His last A1C in [facility] was 6.2 10/14/17. He says the program is helping him a lot. He is able to do more things now without fatigue. He is able to work his job and go home and do things around the house. He has more strength and energy. Will continue to monitor for progress.   Patient has completed 18 sessions losing 5.4 lbs  since he started the program. He continues to do well in the program. No recent Hgb A1C readings. He continues to say he is breathing better and he is pleased wtih his weight loss. He continues to be able to work without difficulty and is doing more at home. He has recently started Chantix to try and quit smoking. He is down to 2 cigarettes per day now. Will continue to monitor for progress.   Patient has completed 22 lbs losing 2 lbs since last 30 day review. He continues to do well in the program with progression. No recent hgb A1C readings recorded. He continues to say he is breathing better. He continues to be happy about his weight loss. He is no longer working because his employer went out of business. He continues on Chantix  and is down to 1 cigarette per day. Will continue to monitor for progress.   Patient graduated with 24 sessions losing 7 lbs overall. He did very well in the program. His exit meausements improved in grip strength and balance. His exit walk test increased by 13.64%. His Medficts decreased by 44. He says he is trying to eat more vegetables and fish and cook healthier. He says he is back to doing his ADL's and has more strength and energy. He says he is very pleased with his weight loss and overall progress in the program. He is able to do more with getting SOB or fatigued. He plans to continue exercising by walking.    Expected Outcomes  Patient will continue to attend sessions and complete the program and meet his personal goals.   Patient will continue to attend sessions and complete the program and meet his personal goals.   Patient  will continue to attend sessions and complete the program and meet his personal goals.   Patient will continue to attend sessions and complete the program and meet his personal goals.   Patient will continue to exercise by walking and continue to meet his personal goals. PR will f/u for one year.       Exercise Goals and Review: Exercise Goals    Row Name 01/08/18 0947             Exercise Goals   Increase Physical Activity  Yes       Intervention  Provide advice, education, support and counseling about physical activity/exercise needs.;Develop an individualized exercise prescription for aerobic and resistive training based on initial evaluation findings, risk stratification, comorbidities and participant's personal goals.       Expected Outcomes  Short Term: Attend rehab on a regular basis to increase amount of physical activity.       Increase Strength and Stamina  Yes       Intervention  Provide advice, education, support and counseling about physical activity/exercise needs.;Develop an individualized exercise prescription for aerobic and resistive training based on initial evaluation findings, risk stratification, comorbidities and participant's personal goals.       Expected Outcomes  Short Term: Increase workloads from initial exercise prescription for resistance, speed, and METs.       Able to understand and use rate of perceived exertion (RPE) scale  Yes       Intervention  Provide education and explanation on how to use RPE scale       Expected Outcomes  Short Term: Able to use RPE daily in rehab to express subjective intensity level;Long Term:  Able to use RPE to guide intensity level when exercising independently  Able to understand and use Dyspnea scale  Yes       Intervention  Provide education and explanation on how to use Dyspnea scale       Expected Outcomes  Short Term: Able to use Dyspnea scale daily in rehab to express subjective sense of shortness of breath during  exertion;Long Term: Able to use Dyspnea scale to guide intensity level when exercising independently       Knowledge and understanding of Target Heart Rate Range (THRR)  Yes       Intervention  Provide education and explanation of THRR including how the numbers were predicted and where they are located for reference       Expected Outcomes  Short Term: Able to state/look up THRR;Long Term: Able to use THRR to govern intensity when exercising independently;Short Term: Able to use daily as guideline for intensity in rehab       Able to check pulse independently  Yes       Intervention  Provide education and demonstration on how to check pulse in carotid and radial arteries.;Review the importance of being able to check your own pulse for safety during independent exercise       Expected Outcomes  Short Term: Able to explain why pulse checking is important during independent exercise;Long Term: Able to check pulse independently and accurately       Understanding of Exercise Prescription  Yes       Intervention  Provide education, explanation, and written materials on patient's individual exercise prescription       Expected Outcomes  Short Term: Able to explain program exercise prescription;Long Term: Able to explain home exercise prescription to exercise independently          Nutrition & Weight - Outcomes: Pre Biometrics - 01/08/18 0947      Pre Biometrics   Height  _0  (1.702 m)    Weight  252 lb 6.4 oz (114.5 kg)    Waist Circumference  44.5 inches    Hip Circumference  42.5 inches    Waist to Hip Ratio  1.05 %    BMI (Calculated)  39.52    Triceps Skinfold  10 mm    % Body Fat  32.2 %    Grip Strength  75.06 kg    Flexibility  0 in    Single Leg Stand  3.5 seconds      Post Biometrics - 04/29/18 1412       Post  Biometrics   Height  _1  (1.702 m)    Weight  245 lb 2.4 oz (111.2 kg)    Waist Circumference  44 inches    Hip Circumference  42 inches    Waist to Hip Ratio  1.05  %    BMI (Calculated)  38.39    Triceps Skinfold  11 mm    % Body Fat  31.9 %    Grip Strength  107.1 kg    Flexibility  0 in    Single Leg Stand  20 seconds       Nutrition: Nutrition Therapy & Goals - 04/17/18 1429      Nutrition Therapy   RD appointment deferred  Yes      Personal Nutrition Goals   Personal Goal #2  Patient continues to work on eating more heart healthy. He is working with a dietician to lose weight.     Additional Goals?  No       Nutrition Discharge: Nutrition Assessments -  04/29/18 1432      MEDFICTS Scores   Pre Score  59    Post Score  15    Score Difference  -44       Education Questionnaire Score: Knowledge Questionnaire Score - 04/29/18 1431      Knowledge Questionnaire Score   Pre Score  15/18    Post Score  16/18       Goals reviewed with patient; copy given to patient.

## 2018-04-29 NOTE — Progress Notes (Signed)
Pulmonary Individual Treatment Plan  Patient Details  Name: Ricardo Burch MRN: [id number] Date of Birth: 02/16/1961 Referring Provider:     PULMONARY REHAB COPD ORIENTATION from 01/08/2018 in [place]  Referring Provider  DR. Koneswaran      Initial Encounter Date:    PULMONARY REHAB COPD ORIENTATION from 01/08/2018 in [place]  Date  01/08/18      Visit Diagnosis: Chronic obstructive pulmonary disease, unspecified COPD type ([place])  Patient's Home Medications on Admission:   Current Outpatient Medications:  .  amLODipine (NORVASC) 5 MG tablet, Take 1 tablet (5 mg total) by mouth daily., Disp: 90 tablet, Rfl: 3 .  ANORO ELLIPTA 62.5-25 MCG/INH AEPB, INHALE 1 PUFF INTO THE LUNGS DAILY., Disp: 60 each, Rfl: 0 .  aspirin EC 81 MG tablet, Take 1 tablet (81 mg total) by mouth daily., Disp: 30 tablet, Rfl: 0 .  DALIRESP 500 MCG TABS tablet, Take 1 tablet (500 mcg total) by mouth daily., Disp: 30 tablet, Rfl: 5 .  diphenhydrAMINE (BENADRYL) 25 MG tablet, Take 1 tablet (25 mg total) by mouth every 6 (six) hours. As needed for itching, Disp: 20 tablet, Rfl: 0 .  Guaifenesin (MUCINEX MAXIMUM STRENGTH) 1200 MG TB12, Take 1,200 mg by mouth daily., Disp: , Rfl:  .  OXYGEN, Inhale 2 L into the lungs continuous., Disp: , Rfl:  .  sertraline (ZOLOFT) 25 MG tablet, Take 1 tablet (25 mg total) by mouth daily., Disp: 30 tablet, Rfl: 2 .  triamcinolone cream (KENALOG) 0.1 %, Apply 1 application topically 3 (three) times daily. Apply to affected areas, Disp: 453.6 g, Rfl: 0 .  varenicline (CHANTIX STARTING MONTH PAK) 0.5 MG X 11 & 1 MG X 42 tablet, Take 1 0.5 mg tablet daily for 3 days, then increase to 1 0.5 mg tablet twice daily for 4 days, then increase to 1 1 mg tablet twice daily., Disp: 53 tablet, Rfl: 0 .  VENTOLIN HFA 108 (90 Base) MCG/ACT inhaler, Inhale 1-2 puffs into the lungs every 6 (six) hours as needed for wheezing or shortness of breath.  (200/12=17), Disp: 18 g, Rfl: 2  Past Medical History: Past Medical History:  Diagnosis Date  . Allergy   . CHF (congestive heart failure) ([place])   . CHF (congestive heart failure) ([place])   . Chronic kidney disease   . COPD (chronic obstructive pulmonary disease) ([place]) Dx 2015  . Depression   . Eczema    since childhood   . Hyperlipidemia 01/12/2018  . Hypertension   . Oxygen deficiency   . Sleep apnea    CPAP  . Substance abuse ([place])    MJ, cocaine    Tobacco Use: Social History   Tobacco Use  Smoking Status Light Tobacco Smoker  . Packs/day: 0.25  . Years: 38.00  . Pack years: 9.50  . Types: Cigarettes  Smokeless Tobacco Never Used    Labs: Recent Review Flowsheet Data    Labs for ITP Cardiac and Pulmonary Rehab Latest Ref Rng & Units 09/17/2015 07/26/2016 07/27/2016 07/27/2016 10/14/2017   Cholestrol <200 mg/dL - - - - 136   LDLCALC mg/dL (calc) - - - - 60   HDL >40 mg/dL - - - - 60   Trlycerides <150 mg/dL - - - - 82   Hemoglobin A1c <5.7 % of total Hgb - - - - 5.3   PHART 7.350 - 7.450 7.274(L) - 7.184(LL) 7.341(L) -   PCO2ART 32.0 - 48.0 mmHg 81.2(HH) - 0.0(LL) 84.2(HH) -  HCO3 20.0 - 28.0 mmol/L 36.7(H) - 47.0(H) 44.3(H) -   TCO2 0 - 100 mmol/L 32.2 44 51.0 - -   O2SAT % 90.5 - 82.8 92.0 -      Capillary Blood Glucose: Lab Results  Component Value Date   GLUCAP 179 (H) 07/27/2016   GLUCAP 156 (H) 07/27/2016   GLUCAP 140 (H) 09/16/2015     Pulmonary Assessment Scores: Pulmonary Assessment Scores    Row Name 01/08/18 1116 04/29/18 1435       ADL UCSD   ADL Phase  Entry  Exit    SOB Score total  31  23    Rest  0  0    Walk  3  5    Stairs  4  4    Bath  1  1    Dress  0  2    Shop  1  1      CAT Score   CAT Score  23  15      mMRC Score   mMRC Score  2  1       Pulmonary Function Assessment: Pulmonary Function Assessment - 01/08/18 1112      Pulmonary Function Tests   FVC%  58 %    FEV1%  45 %    FEV1/FVC Ratio  76    RV%  142 %     DLCO%  56 %      Post Bronchodilator Spirometry Results   FVC%  2.72 %    FEV1%  1.65 %    FEV1/FVC Ratio  61      Breath   Bilateral Breath Sounds  Clear    Shortness of Breath  Yes       Exercise Target Goals:    Exercise Program Goal: Individual exercise prescription set using results from initial 6 min walk test and THRR while considering  patient's activity barriers and safety.   Exercise Prescription Goal: Initial exercise prescription builds to 30-45 minutes a day of aerobic activity, 2-3 days per week.  Home exercise guidelines will be given to patient during program as part of exercise prescription that the participant will acknowledge.  Activity Barriers & Risk Stratification: Activity Barriers & Cardiac Risk Stratification - 01/08/18 0945      Activity Barriers & Cardiac Risk Stratification   Activity Barriers  Shortness of Breath    Cardiac Risk Stratification  Moderate       6 Minute Walk: 6 Minute Walk    Row Name 01/08/18 0943 04/29/18 1411       6 Minute Walk   Phase  Initial  Discharge    Distance  1100 feet  1250 feet    Distance % Change  0 %  13.64 %    Distance Feet Change  0 ft  150 ft    Walk Time  6 minutes  6 minutes    # of Rest Breaks  0  0    MPH  2.08  2.36    METS  2.59  2.08    RPE  11  13    Perceived Dyspnea   14  13    VO2 Peak  10.63  12.17    Symptoms  No  No    Resting HR  91 bpm  91 bpm    Resting BP  124/90  126/76    Resting Oxygen Saturation   92 %  92 %    Exercise Oxygen Saturation  during  6 min walk  82 %  79 %    Max Ex. HR  112 bpm  119 bpm    Max Ex. BP  154/96  158/86    2 Minute Post BP  126/88  138/80       Oxygen Initial Assessment: Oxygen Initial Assessment - 01/08/18 1032      Home Oxygen   Home Oxygen Device  E-Tanks;Home Concentrator    Sleep Oxygen Prescription  CPAP    Liters per minute  2    Home Exercise Oxygen Prescription  None    Home at Rest Exercise Oxygen Prescription  Continuous     Liters per minute  2    Compliance with Home Oxygen Use  Yes      Initial 6 min Walk   Oxygen Used  Continuous    Liters per minute  4      Program Oxygen Prescription   Program Oxygen Prescription  Continuous;E-Tanks    Liters per minute  2      Intervention   Short Term Goals  To learn and demonstrate proper pursed lip breathing techniques or other breathing techniques.;To learn and understand importance of maintaining oxygen saturations>88%    Long  Term Goals  Exhibits proper breathing techniques, such as pursed lip breathing or other method taught during program session;Maintenance of O2 saturations>88%       Oxygen Re-Evaluation: Oxygen Re-Evaluation    Row Name 02/05/18 1405 03/05/18 1452 03/26/18 1546 04/17/18 1428       Program Oxygen Prescription   Program Oxygen Prescription  Continuous;E-Tanks  Continuous;E-Tanks  Continuous  Continuous    Liters per minute  2  4  4  4     FiO2%  97  90  91  92      Home Oxygen   Home Oxygen Device  E-Tanks;Home Concentrator  E-Tanks;Home Concentrator  E-Tanks;Home Concentrator  E-Tanks;Home Concentrator    Sleep Oxygen Prescription  CPAP  CPAP  CPAP  CPAP    Liters per minute  2  2  2  2     Home Exercise Oxygen Prescription  None  None  None  None    Home at Rest Exercise Oxygen Prescription  None  None  None  None    Compliance with Home Oxygen Use  Yes  Yes  Yes  Yes      Goals/Expected Outcomes   Short Term Goals  To learn and demonstrate proper pursed lip breathing techniques or other breathing techniques.;To learn and understand importance of maintaining oxygen saturations>88%  To learn and demonstrate proper pursed lip breathing techniques or other breathing techniques.;To learn and understand importance of maintaining oxygen saturations>88%  To learn and demonstrate proper pursed lip breathing techniques or other breathing techniques.;To learn and understand importance of maintaining oxygen saturations>88%  To learn and  demonstrate proper pursed lip breathing techniques or other breathing techniques.;To learn and understand importance of maintaining oxygen saturations>88%    Long  Term Goals  Exhibits proper breathing techniques, such as pursed lip breathing or other method taught during program session;Maintenance of O2 saturations>88%  Exhibits proper breathing techniques, such as pursed lip breathing or other method taught during program session;Maintenance of O2 saturations>88%  Exhibits proper breathing techniques, such as pursed lip breathing or other method taught during program session;Maintenance of O2 saturations>88%  Exhibits proper breathing techniques, such as pursed lip breathing or other method taught during program session;Maintenance of O2 saturations>88%    Comments  -  Patient  demonstrates proper pursed lip breathing techniques during exercise and also demonstrates proper usage of pulse oximeter. He is able to verbalize the importance of maintaining his O2 saturations >88%. Will continue to monitor.   Patient demonstrates proper pursed lip breathing techniques during exercise and also demonstrates proper usage of pulse oximeter. He is able to verbalize the importance of maintaining his O2 saturations >88%. Will continue to monitor.   Patient demonstrates proper pursed lip breathing techniques during exercise and also demonstrates proper usage of pulse oximeter. He is able to verbalize the importance of maintaining his O2 saturations >88%. Will continue to monitor.     Goals/Expected Outcomes  -  Patient will continue to meet both his short and long term goals.   Patient will continue to meet both his short and long term goals.   Patient will continue to meet both his short and long term goals.        Oxygen Discharge (Final Oxygen Re-Evaluation): Oxygen Re-Evaluation - 04/17/18 1428      Program Oxygen Prescription   Program Oxygen Prescription  Continuous    Liters per minute  4    FiO2%  92       Home Oxygen   Home Oxygen Device  E-Tanks;Home Concentrator    Sleep Oxygen Prescription  CPAP    Liters per minute  2    Home Exercise Oxygen Prescription  None    Home at Rest Exercise Oxygen Prescription  None    Compliance with Home Oxygen Use  Yes      Goals/Expected Outcomes   Short Term Goals  To learn and demonstrate proper pursed lip breathing techniques or other breathing techniques.;To learn and understand importance of maintaining oxygen saturations>88%    Long  Term Goals  Exhibits proper breathing techniques, such as pursed lip breathing or other method taught during program session;Maintenance of O2 saturations>88%    Comments  Patient demonstrates proper pursed lip breathing techniques during exercise and also demonstrates proper usage of pulse oximeter. He is able to verbalize the importance of maintaining his O2 saturations >88%. Will continue to monitor.     Goals/Expected Outcomes  Patient will continue to meet both his short and long term goals.        Initial Exercise Prescription: Initial Exercise Prescription - 01/08/18 0900      Date of Initial Exercise RX and Referring Provider   Date  01/08/18    Referring Provider  DR. Koneswaran      Treadmill   MPH  1.3    Grade  0    Minutes  15    METs  1.9      NuStep   Level  2    SPM  55    Minutes  20    METs  1.8      Prescription Details   Frequency (times per week)  3    Duration  Progress to 30 minutes of continuous aerobic without signs/symptoms of physical distress      Intensity   THRR 40-80% of Max Heartrate  [phone]    Ratings of Perceived Exertion  11-13    Perceived Dyspnea  0-4      Progression   Progression  Continue progressive overload as per policy without signs/symptoms or physical distress.      Resistance Training   Training Prescription  Yes    Weight  1    Reps  10-15       Perform Capillary Blood Glucose  checks as needed.  Exercise Prescription Changes:  Exercise  Prescription Changes    Row Name 01/15/18 1400 01/22/18 0700 02/02/18 1400 02/26/18 0800 03/10/18 1000     Response to Exercise   Blood Pressure (Admit)  -  126/72  118/72  120/74  112/72   Blood Pressure (Exercise)  -  142/90  128/76  144/70  126/70   Blood Pressure (Exit)  -  126/72  114/66  120/74  112/72   Heart Rate (Admit)  -  106 bpm  96 bpm  92 bpm  102 bpm   Heart Rate (Exercise)  -  114 bpm  106 bpm  113 bpm  107 bpm   Heart Rate (Exit)  -  92 bpm  95 bpm  90 bpm  91 bpm   Oxygen Saturation (Admit)  -  93 %  92 %  93 %  91 %   Oxygen Saturation (Exercise)  -  95 %  91 %  91 %  90 %   Oxygen Saturation (Exit)  -  91 %  90 %  96 %  92 %   Rating of Perceived Exertion (Exercise)  -  11  11  11  11    Perceived Dyspnea (Exercise)  -  11  11  11  11    Duration  -  Progress to 30 minutes of  aerobic without signs/symptoms of physical distress  Progress to 30 minutes of  aerobic without signs/symptoms of physical distress  Progress to 30 minutes of  aerobic without signs/symptoms of physical distress  Progress to 30 minutes of  aerobic without signs/symptoms of physical distress   Intensity  -  THRR New [phone]  THRR New 124-138-152  THRR New 122-136-151  THRR New [phone]     Progression   Progression  -  Continue to progress workloads to maintain intensity without signs/symptoms of physical distress.  Continue to progress workloads to maintain intensity without signs/symptoms of physical distress.  Continue to progress workloads to maintain intensity without signs/symptoms of physical distress.  Continue to progress workloads to maintain intensity without signs/symptoms of physical distress.     Resistance Training   Training Prescription  Yes  Yes  Yes  Yes  Yes   Weight  1  2  2  4  4    Reps  10-15  10-15  10-15  10-15  10-15     Treadmill   MPH  1.3  1.4  1.4  1.7  1.8   Grade  0  0  0  0  0   Minutes  15  15  15  15  15    METs  1.9  2.07  2.07  2.3  2.4     NuStep    Level  2  2  2  2  2    SPM  55  80  87  93  98   Minutes  20  20  20  20  20    METs  1.8  1.9  1.9  2  2.3     Home Exercise Plan   Plans to continue exercise at  Home (comment)  Home (comment)  Home (comment)  Home (comment)  Home (comment)   Frequency  Add 2 additional days to program exercise sessions.  Add 2 additional days to program exercise sessions.  Add 2 additional days to program exercise sessions.  Add 2 additional days to program exercise sessions.  Add 2 additional days to  program exercise sessions.   Initial Home Exercises Provided  01/15/18  01/15/18  01/15/18  01/15/18  01/15/18   Row Name 03/23/18 1500 04/06/18 1400 04/22/18 1400         Response to Exercise   Blood Pressure (Admit)  124/80  108/62  128/80     Blood Pressure (Exercise)  162/88  124/70  134/82     Blood Pressure (Exit)  116/70  112/64  130/74     Heart Rate (Admit)  98 bpm  96 bpm  94 bpm     Heart Rate (Exercise)  99 bpm  113 bpm  108 bpm     Heart Rate (Exit)  91 bpm  95 bpm  94 bpm     Oxygen Saturation (Admit)  92 %  93 %  93 %     Oxygen Saturation (Exercise)  91 %  90 %  93 %     Oxygen Saturation (Exit)  92 %  96 %  95 %     Rating of Perceived Exertion (Exercise)  10  11  10      Perceived Dyspnea (Exercise)  10  11  10      Duration  Progress to 30 minutes of  aerobic without signs/symptoms of physical distress  Progress to 30 minutes of  aerobic without signs/symptoms of physical distress  Progress to 30 minutes of  aerobic without signs/symptoms of physical distress     Intensity  THRR New [id number]  THRR New 124-138-152  THRR New [phone]       Progression   Progression  Continue to progress workloads to maintain intensity without signs/symptoms of physical distress.  Continue to progress workloads to maintain intensity without signs/symptoms of physical distress.  Continue to progress workloads to maintain intensity without signs/symptoms of physical distress.       Resistance Training    Training Prescription  Yes  Yes  Yes     Weight  4  4  5      Reps  10-15  10-15  10-15       Treadmill   MPH  1.8  1.9  2.1     Grade  0  0  0     Minutes  15  15  15      METs  2.4  2.4  2.6       NuStep   Level  2  3  3      SPM  92  94  113     Minutes  20  20  20      METs  1.8  2.3  3.1       Home Exercise Plan   Plans to continue exercise at  Home (comment)  Home (comment)  Home (comment)     Frequency  Add 2 additional days to program exercise sessions.  Add 2 additional days to program exercise sessions.  Add 2 additional days to program exercise sessions.     Initial Home Exercises Provided  01/15/18  01/15/18  01/15/18        Exercise Comments:  Exercise Comments    Row Name 01/15/18 1408 01/22/18 0758 02/02/18 1413 02/26/18 0813 03/10/18 1024   Exercise Comments  Patient has received the take home exercise plan today 01/15/2018. THR was addressed as were safety guidelines for being active when not here in [place]. Patient demonstrated an understanding and was encouraged to ask any future questions.   Patient is doing well in PR for just  starting. Patient has increased his SPMs on the nustep and has increased his speed on th etreadmill to 1.4. Patients progression will continued to be monitored throughout the remainder of the program.   Patient continues to do well in PR and has maintained his level on both the treadmill as well as thet nustep machine. Patient has also increased his SPMs on the nustep machine. Patient has still only just started the program and will be progressed more in time.   Patient continues to do well in PR. Patient has increased his speed on the treadmill as well as increasing his SPMs on the nustep machine. Patients O2 levels have been staying above 90 since he is attatched to the O2 here at PR. Patient seems to be enjoying the program along with his classmates and we will continue to monitor the patient throughout the remainder of the program.   Patient  continues to do well in PR. Patient has progressed on the treadmill to 1.92mh. Patient has also maintained his level on the nustep machine. 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    [place]18 1517 04/06/18 1447 04/22/18 1414       Exercise Comments  Patient continues to do well in PR. patient has maintained his levels and speed on both of his machines. Patient has admitted to only smoking 1-2 cigarettes a day and has also stated that he has lost weight and is breathing better now since he started the priogram. Patient wishes to continue down this path.   Patient is doing well in PR. Patient has increased his level on the nustep machine and has also increased his speed on the treadmill. Patient states that he feels much more energy from the program and is still attempting to quit smoking.   Patient has been doing very well in PR. Patient has increased his dumbbell size for the warm up to 5lbs and has also increased his speed on the treadmill to 2.1 and has increased his SPMs on the nustep machine         Exercise Goals and Review:  Exercise Goals    Row Name 01/08/18 0947             Exercise Goals   Increase Physical Activity  Yes       Intervention  Provide advice, education, support and counseling about physical activity/exercise needs.;Develop an individualized exercise prescription for aerobic and resistive training based on initial evaluation findings, risk stratification, comorbidities and participant's personal goals.       Expected Outcomes  Short Term: Attend rehab on a regular basis to increase amount of physical activity.       Increase Strength and Stamina  Yes       Intervention  Provide advice, education, support and counseling about physical activity/exercise needs.;Develop an individualized exercise prescription for aerobic  and resistive training based on initial evaluation findings, risk stratification, comorbidities and participant's personal goals.       Expected Outcomes  Short Term: Increase workloads from initial exercise prescription for resistance, speed, and METs.       Able to understand and use rate of perceived exertion (RPE) scale  Yes       Intervention  Provide education and explanation on how to use RPE scale  Expected Outcomes  Short Term: Able to use RPE daily in rehab to express subjective intensity level;Long Term:  Able to use RPE to guide intensity level when exercising independently       Able to understand and use Dyspnea scale  Yes       Intervention  Provide education and explanation on how to use Dyspnea scale       Expected Outcomes  Short Term: Able to use Dyspnea scale daily in rehab to express subjective sense of shortness of breath during exertion;Long Term: Able to use Dyspnea scale to guide intensity level when exercising independently       Knowledge and understanding of Target Heart Rate Range (THRR)  Yes       Intervention  Provide education and explanation of THRR including how the numbers were predicted and where they are located for reference       Expected Outcomes  Short Term: Able to state/look up THRR;Long Term: Able to use THRR to govern intensity when exercising independently;Short Term: Able to use daily as guideline for intensity in rehab       Able to check pulse independently  Yes       Intervention  Provide education and demonstration on how to check pulse in carotid and radial arteries.;Review the importance of being able to check your own pulse for safety during independent exercise       Expected Outcomes  Short Term: Able to explain why pulse checking is important during independent exercise;Long Term: Able to check pulse independently and accurately       Understanding of Exercise Prescription  Yes       Intervention  Provide education, explanation, and written  materials on patient's individual exercise prescription       Expected Outcomes  Short Term: Able to explain program exercise prescription;Long Term: Able to explain home exercise prescription to exercise independently          Exercise Goals Re-Evaluation : Exercise Goals Re-Evaluation    Row Name 02/02/18 1412 03/02/18 1457 03/23/18 1516 04/14/18 0829       Exercise Goal Re-Evaluation   Exercise Goals Review  Increase Physical Activity;Increase Strength and Stamina;Able to understand and use rate of perceived exertion (RPE) scale;Able to check pulse independently;Knowledge and understanding of Target Heart Rate Range (THRR);Able to understand and use Dyspnea scale;Understanding of Exercise Prescription  Increase Physical Activity;Increase Strength and Stamina;Able to understand and use rate of perceived exertion (RPE) scale;Able to check pulse independently;Knowledge and understanding of Target Heart Rate Range (THRR);Able to understand and use Dyspnea scale;Understanding of Exercise Prescription  Increase Physical Activity;Increase Strength and Stamina;Able to understand and use rate of perceived exertion (RPE) scale;Able to check pulse independently;Knowledge and understanding of Target Heart Rate Range (THRR);Able to understand and use Dyspnea scale;Understanding of Exercise Prescription  Increase Physical Activity;Increase Strength and Stamina;Able to understand and use rate of perceived exertion (RPE) scale;Able to check pulse independently;Knowledge and understanding of Target Heart Rate Range (THRR);Able to understand and use Dyspnea scale;Understanding of Exercise Prescription    Comments  Patient continues to do well in PR and has maintained his level on both the treadmill as well as thet nustep machine. Patient has also increased his SPMs on the nustep machine. Patient has still only just started the program and will be progressed more in time.   Patient is doing very well in PR. Patient has  increased his speed on the treadmill to 1.7 and as well as maintaining his level  and SPMs on the nustep machine. Patient has also lost weight since beginning the program. 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   Patient continues to do well in PR. patient has maintained his levels and speed on both of his machines. Patient has admitted to only smoking 1-2 cigarettes a day and has also stated that he has lost weight and is breathing better now since he started the priogram. Patient wishes to continue down this path.   Patient is doing very well in PR. patient has been working hard at his new job and has had much more energy and stamina for it. Patient has also started taking chantix in order for him to quit smoking. Patient states that he feels stronger and seems to truly be enjoying the program.     Expected Outcomes  Patient wishes to breathe better and to lose weight.   Patient wishes to breathe better and to lose weight.   Patient wishes to breathe better and to lose weight.   Patient wishes to breathe better and to lose weight.        Discharge Exercise Prescription (Final Exercise Prescription Changes): Exercise Prescription Changes - 04/22/18 1400      Response to Exercise   Blood Pressure (Admit)  128/80    Blood Pressure (Exercise)  134/82    Blood Pressure (Exit)  130/74    Heart Rate (Admit)  94 bpm    Heart Rate (Exercise)  108 bpm    Heart Rate (Exit)  94 bpm    Oxygen Saturation (Admit)  93 %    Oxygen Saturation (Exercise)  93 %    Oxygen Saturation (Exit)  95 %    Rating of Perceived Exertion (Exercise)  10    Perceived Dyspnea (Exercise)  10    Duration  Progress to 30 minutes of  aerobic without signs/symptoms of physical distress    Intensity  THRR New [id number]      Progression   Progression  Continue to progress workloads to maintain intensity without signs/symptoms of physical distress.      Resistance  Training   Training Prescription  Yes    Weight  5    Reps  10-15      Treadmill   MPH  2.1    Grade  0    Minutes  15    METs  2.6      NuStep   Level  3    SPM  113    Minutes  20    METs  3.1      Home Exercise Plan   Plans to continue exercise at  Home (comment)    Frequency  Add 2 additional days to program exercise sessions.    Initial Home Exercises Provided  01/15/18       Nutrition:  Target Goals: Understanding of nutrition guidelines, daily intake of sodium <1587m, cholesterol <2071m calories 30% from fat and 7% or less from saturated fats, daily to have 5 or more servings of fruits and vegetables.  Biometrics: Pre Biometrics - 01/08/18 0947      Pre Biometrics   Height  5' 7"  (1.702 m)    Weight  252 lb 6.4 oz (114.5 kg)    Waist Circumference  44.5 inches    Hip Circumference  42.5 inches    Waist to Hip Ratio  1.05 %    BMI (Calculated)  39.52[place]  Triceps Skinfold  10 mm    % Body Fat  32.2 %    Grip Strength  75.06 kg    Flexibility  0 in    Single Leg Stand  3.5 seconds      Post Biometrics - 04/29/18 1412       Post  Biometrics   Height  5' 7"  (1.702 m)    Weight  245 lb 2.4 oz (111.2 kg)    Waist Circumference  44 inches    Hip Circumference  42 inches    Waist to Hip Ratio  1.05 %    BMI (Calculated)  38.39    Triceps Skinfold  11 mm    % Body Fat  31.9 %    Grip Strength  107.1 kg    Flexibility  0 in    Single Leg Stand  20 seconds       Nutrition Therapy Plan and Nutrition Goals: Nutrition Therapy & Goals - 04/17/18 1429      Nutrition Therapy   RD appointment deferred  Yes      Personal Nutrition Goals   Personal Goal #2  Patient continues to work on eating more heart healthy. He is working with a dietician to lose weight.     Additional Goals?  No       Nutrition Assessments: Nutrition Assessments - 04/29/18 1432      MEDFICTS Scores   Pre Score  59    Post Score  15    Score Difference  -44       Nutrition  Goals Re-Evaluation:   Nutrition Goals Discharge (Final Nutrition Goals Re-Evaluation):   Psychosocial: Target Goals: Acknowledge presence or absence of significant depression and/or stress, maximize coping skills, provide positive support system. Participant is able to verbalize types and ability to use techniques and skills needed for reducing stress and depression.  Initial Review & Psychosocial Screening: Initial Psych Review & Screening - 01/08/18 1132      Initial Review   Current issues with  None Identified      Family Dynamics   Concerns  No support system      Barriers   Psychosocial barriers to participate in program  There are no identifiable barriers or psychosocial needs.      Screening Interventions   Interventions  Encouraged to exercise    Expected Outcomes  Short Term goal: Identification and review with participant of any Quality of Life or Depression concerns found by scoring the questionnaire.;Long Term goal: The participant improves quality of Life and PHQ9 Scores as seen by post scores and/or verbalization of changes       Quality of Life Scores: Quality of Life - 04/29/18 1413      Quality of Life   Select  Quality of Life      Quality of Life Scores   Health/Function Pre  9.23 %    Health/Function Post  21.63 %    Health/Function % Change  134.34 %    Socioeconomic Pre  14.17 %    Socioeconomic Post  23.57 %    Socioeconomic % Change   66.34 %    Psych/Spiritual Pre  18.79 %    Psych/Spiritual Post  24.79 %    Psych/Spiritual % Change  31.93 %    Family Pre  13.5 %    Family Post  24 %    Family % Change  77.78 %    GLOBAL Pre  12.8 %  GLOBAL Post  22.92 %    GLOBAL % Change  79.06 %      Scores of 19 and below usually indicate a poorer quality of life in these areas.  A difference of  2-3 points is a clinically meaningful difference.  A difference of 2-3 points in the total score of the Quality of Life Index has been associated with  significant improvement in overall quality of life, self-image, physical symptoms, and general health in studies assessing change in quality of life.   PHQ-9: Recent Review Flowsheet Data    Depression screen [facility] 2/9 04/29/2018 02/05/2018 01/08/2018 12/01/2017 09/15/2017   Decreased Interest 0 0 0 0 0   Down, Depressed, Hopeless 0 0 1 0 0   PHQ - 2 Score 0 0 1 0 0   Altered sleeping 0 - 0 - -   Tired, decreased energy - - 1 - -   Change in appetite 0 - 0 - -   Feeling bad or failure about yourself  1 - 0 - -   Trouble concentrating 0 - 0 - -   Moving slowly or fidgety/restless 0 - 0 - -   Suicidal thoughts 0 - 0 - -   PHQ-9 Score 1 - 2 - -   Difficult doing work/chores - - Somewhat difficult - -     Interpretation of Total Score  Total Score Depression Severity:  1-4 = Minimal depression, 5-9 = Mild depression, 10-14 = Moderate depression, 15-19 = Moderately severe depression, 20-27 = Severe depression   Psychosocial Evaluation and Intervention: Psychosocial Evaluation - 04/29/18 1441      Discharge Psychosocial Assessment & Intervention   Comments  Patient has no psychosocial barriers identified at discharge. His PHQ-9 score went from 2 to 1 and his QOL score improved by 79.06% at 22.92%       Psychosocial Re-Evaluation: Psychosocial Re-Evaluation    Young Harris Name 02/05/18 1423 03/05/18 1500 03/26/18 1551 04/17/18 1434       Psychosocial Re-Evaluation   Current issues with  None Identified  None Identified  None Identified  None Identified    Comments  Patient's initial QOL was 12.80 and his PHQ-9 score was 2 indicating depression. He says he is not depressed. He scored losest on health/function and family. He does not have a good family support system.   Patient's initial QOL was 12.80 and his PHQ-9 score was 2 indicating depression. He says he is not depressed. He scored losest on health/function and family. He does not have a good family support system.   Patient's initial QOL was  12.80 and his PHQ-9 score was 2 indicating depression. He says he is not depressed. He scored losest on health/function and family. He does not have a good family support system.   Patient's initial QOL was 12.80 and his PHQ-9 score was 2 indicating depression. He says he is not depressed. He scored losest on health/function and family. He does not have a good family support system.     Expected Outcomes  Patient's QOL and PHQ-9 score will improve at discharge and he will have no additional psychosocial issues identified.   Patient's QOL and PHQ-9 score will improve at discharge and he will have no additional psychosocial issues identified.   Patient's QOL and PHQ-9 score will improve at discharge and he will have no additional psychosocial issues identified.   Patient's QOL and PHQ-9 score will improve at discharge and he will have no additional psychosocial issues identified.  Interventions  Stress management education;Relaxation education;Encouraged to attend Pulmonary Rehabilitation for the exercise  Stress management education;Relaxation education;Encouraged to attend Pulmonary Rehabilitation for the exercise  Stress management education;Relaxation education;Encouraged to attend Pulmonary Rehabilitation for the exercise  Stress management education;Relaxation education;Encouraged to attend Pulmonary Rehabilitation for the exercise    Continue Psychosocial Services   No Follow up required  No Follow up required  No Follow up required  No Follow up required       Psychosocial Discharge (Final Psychosocial Re-Evaluation): Psychosocial Re-Evaluation - 04/17/18 1434      Psychosocial Re-Evaluation   Current issues with  None Identified    Comments  Patient's initial QOL was 12.80 and his PHQ-9 score was 2 indicating depression. He says he is not depressed. He scored losest on health/function and family. He does not have a good family support system.     Expected Outcomes  Patient's QOL and PHQ-9  score will improve at discharge and he will have no additional psychosocial issues identified.     Interventions  Stress management education;Relaxation education;Encouraged to attend Pulmonary Rehabilitation for the exercise    Continue Psychosocial Services   No Follow up required        Education: Education Goals: Education classes will be provided on a weekly basis, covering required topics. Participant will state understanding/return demonstration of topics presented.  Learning Barriers/Preferences: Learning Barriers/Preferences - 01/08/18 1031      Learning Barriers/Preferences   Learning Barriers  None    Learning Preferences  Video;Written Material;Pictoral       Education Topics: How Lungs Work and Diseases: - Discuss the anatomy of the lungs and diseases that can affect the lungs, such as COPD.   PULMONARY REHAB CHRONIC OBSTRUCTIVE PULMONARY DISEASE from 04/23/2018 in [place]  Date  03/12/18  Educator  D. Coad  Instruction Review Code  2- Demonstrated Understanding      Exercise: -Discuss the importance of exercise, FITT principles of exercise, normal and abnormal responses to exercise, and how to exercise safely.   PULMONARY REHAB CHRONIC OBSTRUCTIVE PULMONARY DISEASE from 04/23/2018 in [place]  Date  03/05/18  Educator  [place]  Instruction Review Code  2- Demonstrated Understanding      Environmental Irritants: -Discuss types of environmental irritants and how to limit exposure to environmental irritants.   Meds/Inhalers and oxygen: - Discuss respiratory medications, definition of an inhaler and oxygen, and the proper way to use an inhaler and oxygen.   PULMONARY REHAB CHRONIC OBSTRUCTIVE PULMONARY DISEASE from 04/23/2018 in [place]  Date  03/26/18  Educator  DC      Energy Saving Techniques: - Discuss methods to conserve energy and decrease shortness of breath when performing  activities of daily living.    PULMONARY REHAB CHRONIC OBSTRUCTIVE PULMONARY DISEASE from 04/23/2018 in [place]  Date  04/02/18  Educator  [place]  Instruction Review Code  2- Demonstrated Understanding      Bronchial Hygiene / Breathing Techniques: - Discuss breathing mechanics, pursed-lip breathing technique,  proper posture, effective ways to clear airways, and other functional breathing techniques   PULMONARY REHAB CHRONIC OBSTRUCTIVE PULMONARY DISEASE from 04/23/2018 in [place]  Date  04/09/18  Educator  DC  Instruction Review Code  2- Demonstrated Understanding      Cleaning Equipment: - Provides group verbal and written instruction about the health risks of elevated stress, cause of high stress, and healthy ways to reduce stress.  PULMONARY REHAB CHRONIC OBSTRUCTIVE PULMONARY DISEASE from 04/23/2018 in [place]  Date  01/15/18  Educator  [place]  Instruction Review Code  2- Demonstrated Understanding      Nutrition I: Fats: - Discuss the types of cholesterol, what cholesterol does to the body, and how cholesterol levels can be controlled.   PULMONARY REHAB CHRONIC OBSTRUCTIVE PULMONARY DISEASE from 04/23/2018 in [place]  Date  01/22/18  Educator  [place]  Instruction Review Code  2- Demonstrated Understanding      Nutrition II: Labels: -Discuss the different components of food labels and how to read food labels.   PULMONARY REHAB CHRONIC OBSTRUCTIVE PULMONARY DISEASE from 04/23/2018 in [place]  Date  01/29/18  Educator  DJ  Instruction Review Code  2- Demonstrated Understanding      Respiratory Infections: - Discuss the signs and symptoms of respiratory infections, ways to prevent respiratory infections, and the importance of seeking medical treatment when having a respiratory infection.   PULMONARY REHAB CHRONIC OBSTRUCTIVE PULMONARY DISEASE from  04/23/2018 in [place]  Date  02/05/18  Educator  [place]  Instruction Review Code  2- Demonstrated Understanding      Stress I: Signs and Symptoms: - Discuss the causes of stress, how stress may lead to anxiety and depression, and ways to limit stress.   Stress II: Relaxation: -Discuss relaxation techniques to limit stress.   PULMONARY REHAB CHRONIC OBSTRUCTIVE PULMONARY DISEASE from 04/23/2018 in [place]  Date  02/19/18  Educator  [place]  Instruction Review Code  2- Demonstrated Understanding      Oxygen for Home/Travel: - Discuss how to prepare for travel when on oxygen and proper ways to transport and store oxygen to ensure safety.   Knowledge Questionnaire Score: Knowledge Questionnaire Score - 04/29/18 1431      Knowledge Questionnaire Score   Pre Score  15/18    Post Score  16/18       Core Components/Risk Factors/Patient Goals at Admission: Personal Goals and Risk Factors at Admission - 01/08/18 1128      Core Components/Risk Factors/Patient Goals on Admission    Weight Management  Yes    Intervention  Weight Management/Obesity: Establish reasonable short term and long term weight goals.;Obesity: Provide education and appropriate resources to help participant work on and attain dietary goals.    Admit Weight  252 lb 6.4 oz (114.5 kg)    Goal Weight: Short Term  244 lb 14.4 oz (111.1 kg)    Goal Weight: Long Term  237 lb 6.4 oz (107.7 kg)    Expected Outcomes  Short Term: Continue to assess and modify interventions until short term weight is achieved;Long Term: Adherence to nutrition and physical activity/exercise program aimed toward attainment of established weight goal    Improve shortness of breath with ADL's  Yes    Intervention  Provide education, individualized exercise plan and daily activity instruction to help decrease symptoms of SOB with activities of daily living.    Expected Outcomes  Short Term: Improve  cardiorespiratory fitness to achieve a reduction of symptoms when performing ADLs;Long Term: Be able to perform more ADLs without symptoms or delay the onset of symptoms    Personal Goal Other  Yes    Personal Goal  Breathe better, Lose 15lbs while in program    Intervention  Attend class 2 x week and supplement with home exercise 3 x week    Expected Outcomes  Reach personal  goals       Core Components/Risk Factors/Patient Goals Review:  Goals and Risk Factor Review    Row Name 02/05/18 1408 03/05/18 1455 03/26/18 1547 04/17/18 1429 04/29/18 1436     Core Components/Risk Factors/Patient Goals Review   Personal Goals Review  Weight Management/Obesity;Improve shortness of breath with ADL's;Diabetes Breathe better; lose weight 15 lbs; do regular ADL's.   Weight Management/Obesity;Improve shortness of breath with ADL's;Diabetes Breathe better; lose weight 15 lbs; do regular ADL's.   Weight Management/Obesity;Improve shortness of breath with ADL's;Diabetes;Tobacco Cessation Breathe better; lose weight 15 lbs; do regular ADL's.  Weight Management/Obesity;Improve shortness of breath with ADL's;Diabetes;Tobacco Cessation Brethe better; lose weight 15 lbs; do regular ADL's.   Weight Management/Obesity;Improve shortness of breath with ADL's;Diabetes;Tobacco Cessation Breathe better; do regular ADL's; lose weight 15 lbs.    Review  Patient has completed 7 sessions losing 6.1 lbs. He is doing well in the program with some progression. He says it is too soon to tell a difference yet in the program but hopes as he continues the program he will meet his personal goals.   Patient has completed 13 sessions losing 4.5 lb overall. He continues to do well in the program with progression. His last A1C in [facility] was 6.2 10/14/17. He says the program is helping him a lot. He is able to do more things now without fatigue. He is able to work his job and go home and do things around the house. He has more strength and energy.  Will continue to monitor for progress.   Patient has completed 18 sessions losing 5.4 lbs since he started the program. He continues to do well in the program. No recent Hgb A1C readings. He continues to say he is breathing better and he is pleased wtih his weight loss. He continues to be able to work without difficulty and is doing more at home. He has recently started Chantix to try and quit smoking. He is down to 2 cigarettes per day now. Will continue to monitor for progress.   Patient has completed 22 lbs losing 2 lbs since last 30 day review. He continues to do well in the program with progression. No recent hgb A1C readings recorded. He continues to say he is breathing better. He continues to be happy about his weight loss. He is no longer working because his employer went out of business. He continues on Chantix  and is down to 1 cigarette per day. Will continue to monitor for progress.   Patient graduated with 24 sessions losing 7 lbs overall. He did very well in the program. His exit meausements improved in grip strength and balance. His exit walk test increased by 13.64%. His Medficts decreased by 44. He says he is trying to eat more vegetables and fish and cook healthier. He says he is back to doing his ADL's and has more strength and energy. He says he is very pleased with his weight loss and overall progress in the program. He is able to do more with getting SOB or fatigued. He plans to continue exercising by walking.    Expected Outcomes  Patient will continue to attend sessions and complete the program and meet his personal goals.   Patient will continue to attend sessions and complete the program and meet his personal goals.   Patient will continue to attend sessions and complete the program and meet his personal goals.   Patient will continue to attend sessions and complete the program and meet his  personal goals.   Patient will continue to exercise by walking and continue to meet his personal  goals. PR will f/u for one year.       Core Components/Risk Factors/Patient Goals at Discharge (Final Review):  Goals and Risk Factor Review - 04/29/18 1436      Core Components/Risk Factors/Patient Goals Review   Personal Goals Review  Weight Management/Obesity;Improve shortness of breath with ADL's;Diabetes;Tobacco Cessation Breathe better; do regular ADL's; lose weight 15 lbs.     Review  Patient graduated with 24 sessions losing 7 lbs overall. He did very well in the program. His exit meausements improved in grip strength and balance. His exit walk test increased by 13.64%. His Medficts decreased by 44. He says he is trying to eat more vegetables and fish and cook healthier. He says he is back to doing his ADL's and has more strength and energy. He says he is very pleased with his weight loss and overall progress in the program. He is able to do more with getting SOB or fatigued. He plans to continue exercising by walking.     Expected Outcomes  Patient will continue to exercise by walking and continue to meet his personal goals. PR will f/u for one year.        ITP Comments: ITP Comments    Row Name 01/08/18 1118           ITP Comments  Mr. Formisano is a pleasant 57 year old African American male. He has not been in our program before. He is eager to get started. Wants to get stronger so that he can get back to his regular activites.           Comments: Patient graduated from [place] today on 04/23/2018 after completing 24 sessions. He achieved LTG of 30 minutes of aerobic exercise at Max Met level of 2.81. All patients vitals are WNL. Patient has met with dietician. Discharge instruction has been reviewed in detail and patient stated an understanding of material given. Patient plans to continue exercising at home by walking. Cardiac Rehab staff will make f/u calls at 1 month, 6 months, and 1 year. Patient had no complaints of any abnormal S/S or pain on their exit visit.

## 2018-04-30 ENCOUNTER — Encounter (HOSPITAL_COMMUNITY): Payer: Medicare Other

## 2018-05-05 ENCOUNTER — Encounter (HOSPITAL_COMMUNITY): Payer: Medicare Other

## 2018-05-07 ENCOUNTER — Encounter (HOSPITAL_COMMUNITY): Payer: Medicare Other

## 2018-05-11 ENCOUNTER — Other Ambulatory Visit: Payer: Self-pay | Admitting: Family Medicine

## 2018-05-12 ENCOUNTER — Emergency Department (HOSPITAL_COMMUNITY)
Admission: EM | Admit: 2018-05-12 | Discharge: 2018-05-12 | Disposition: A | Discharge status: Home / Self Care | Payer: Medicare Other | Attending: Emergency Medicine | Admitting: Emergency Medicine

## 2018-05-12 ENCOUNTER — Telehealth: Payer: Self-pay | Admitting: Pulmonary Disease

## 2018-05-12 ENCOUNTER — Encounter (HOSPITAL_COMMUNITY): Payer: Medicare Other

## 2018-05-12 ENCOUNTER — Encounter (HOSPITAL_COMMUNITY): Payer: Self-pay | Admitting: Emergency Medicine

## 2018-05-12 DIAGNOSIS — Z79899 Other long term (current) drug therapy: Secondary | ICD-10-CM | POA: Diagnosis not present

## 2018-05-12 DIAGNOSIS — R61 Generalized hyperhidrosis: Secondary | ICD-10-CM | POA: Diagnosis not present

## 2018-05-12 DIAGNOSIS — I509 Heart failure, unspecified: Secondary | ICD-10-CM | POA: Insufficient documentation

## 2018-05-12 DIAGNOSIS — F17210 Nicotine dependence, cigarettes, uncomplicated: Secondary | ICD-10-CM | POA: Diagnosis not present

## 2018-05-12 DIAGNOSIS — J449 Chronic obstructive pulmonary disease, unspecified: Secondary | ICD-10-CM | POA: Diagnosis not present

## 2018-05-12 DIAGNOSIS — I10 Essential (primary) hypertension: Secondary | ICD-10-CM

## 2018-05-12 DIAGNOSIS — N189 Chronic kidney disease, unspecified: Secondary | ICD-10-CM | POA: Diagnosis not present

## 2018-05-12 DIAGNOSIS — I130 Hypertensive heart and chronic kidney disease with heart failure and stage 1 through stage 4 chronic kidney disease, or unspecified chronic kidney disease: Secondary | ICD-10-CM | POA: Diagnosis not present

## 2018-05-12 DIAGNOSIS — Z7982 Long term (current) use of aspirin: Secondary | ICD-10-CM | POA: Diagnosis not present

## 2018-05-12 LAB — TROPONIN I

## 2018-05-12 LAB — BASIC METABOLIC PANEL
ANION GAP: 6 (ref 5–15)
BUN: 12 mg/dL (ref 6–20)
CHLORIDE: 99 mmol/L (ref 98–111)
CO2: 32 mmol/L (ref 22–32)
Calcium: 8.9 mg/dL (ref 8.9–10.3)
Creatinine, Ser: 0.85 mg/dL (ref 0.61–1.24)
Glucose, Bld: 98 mg/dL (ref 70–99)
POTASSIUM: 4.1 mmol/L (ref 3.5–5.1)
SODIUM: 137 mmol/L (ref 135–145)

## 2018-05-12 LAB — CBC WITH DIFFERENTIAL/PLATELET
Basophils Absolute: 0 10*3/uL (ref 0.0–0.1)
Basophils Relative: 0 %
EOS ABS: 0.2 10*3/uL (ref 0.0–0.7)
Eosinophils Relative: 1 %
HCT: 55 % — ABNORMAL HIGH (ref 39.0–52.0)
HEMOGLOBIN: 18.5 g/dL — AB (ref 13.0–17.0)
LYMPHS ABS: 3.4 10*3/uL (ref 0.7–4.0)
Lymphocytes Relative: 27 %
MCH: 31.8 pg (ref 26.0–34.0)
MCHC: 33.6 g/dL (ref 30.0–36.0)
MCV: 94.5 fL (ref 78.0–100.0)
MONOS PCT: 8 %
Monocytes Absolute: 1 10*3/uL (ref 0.1–1.0)
NEUTROS ABS: 8.2 10*3/uL — AB (ref 1.7–7.7)
NEUTROS PCT: 64 %
PLATELETS: 185 10*3/uL (ref 150–400)
RBC: 5.82 MIL/uL — AB (ref 4.22–5.81)
RDW: 13.9 % (ref 11.5–15.5)
WBC: 12.8 10*3/uL — AB (ref 4.0–10.5)

## 2018-05-12 LAB — CBG MONITORING, ED: Glucose-Capillary: 93 mg/dL (ref 70–99)

## 2018-05-12 MED ORDER — AMLODIPINE BESYLATE 5 MG PO TABS
5.0000 mg | ORAL_TABLET | Freq: Once | ORAL | Status: DC
  Filled 2018-05-12: qty 1

## 2018-05-12 MED ORDER — AMLODIPINE BESYLATE 5 MG PO TABS: 5.0000 mg | ORAL_TABLET | Freq: Every day | ORAL | 3 refills | Status: DC

## 2018-05-12 NOTE — ED Provider Notes (Signed)
[facility] EMERGENCY DEPARTMENT Provider Note   CSN: [id number] Arrival date & time: 05/12/18  1742     History   Chief Complaint Chief Complaint  Patient presents with  . Diaphoretic    HPI Ricardo Burch is a 57 y.o. male.  HPI Patient presents to the emergency room for evaluation of episodes of diaphoresis.  Patient states he was at home today.  He felt fine without any particular complaints.  He was in bed when he suddenly started breaking out into a sweat.  Patient states those lasted for a few minutes.  He did not have any trouble with any chest pain or shortness of breath.  He did not have any trouble with abdominal pain.  He has not had any trouble with vomiting or diarrhea.  He did feel little bit lightheaded.  Those symptoms resolved but then occurred one other time.  He decided to come into the emergency room for evaluation.  Patient states he is felt fine since he has been here.  He does admit to not taking his blood pressure medications for the last couple of days.  He also has not been following his diet appropriately.  Patient also knows that he saw a spider crawling in his room.  It may have crawled across his leg but he does not think he was bitten. Past Medical History:  Diagnosis Date  . Allergy   . CHF (congestive heart failure) ([place])   . CHF (congestive heart failure) ([place])   . Chronic kidney disease   . COPD (chronic obstructive pulmonary disease) ([place]) Dx 2015  . Depression   . Eczema    since childhood   . Hyperlipidemia 01/12/2018  . Hypertension   . Oxygen deficiency   . Sleep apnea    CPAP  . Substance abuse ([place])    MJ, cocaine    Patient Active Problem List   Diagnosis Date Noted  . Premature ejaculation 01/12/2018  . Hyperlipidemia 01/12/2018  . Hypertension 10/14/2017  . Aortic atherosclerosis ([place]) 09/15/2017  . Pre-diabetes 09/15/2017  . Cocaine abuse ([place]) 07/26/2016  . Depression 07/26/2016  . OSA on CPAP 07/26/2016  . CHF (congestive  heart failure) ([place]) 06/27/2016  . Alcohol abuse 06/27/2016  . COPD (chronic obstructive pulmonary disease) ([place]) 08/23/2014  . Eczema 08/23/2014  . Right knee pain 08/23/2014  . Tobacco abuse 03/27/2014    Past Surgical History:  Procedure Laterality Date  . COLONOSCOPY WITH PROPOFOL N/A 06/25/2017   Procedure: COLONOSCOPY WITH PROPOFOL;  Surgeon: Daneil Dolin, MD;  Location[facility] END[facility];  Service: Endoscopy;  Laterality: N/A;  2:00pm  . MULTIPLE EXTRACTIONS WITH ALVEOLOPLASTY N/A 03/22/2016   Procedure: MULTIPLE EXTRACTION WITH ALVEOLOPLASTY;  Surgeon: Diona Browner, DDS;  Location: [place];  Service: Oral Surgery;  Laterality: N/A;  . NO PAST SURGERIES    . POLYPECTOMY  06/25/2017   Procedure: POLYPECTOMY;  Surgeon: Daneil Dolin, MD;  Location: [facility] END[facility];  Service: Endoscopy;;  colon        Home Medications    Prior to Admission medications   Medication Sig Start Date End Date Taking? Authorizing Provider  ANORO ELLIPTA 62.5-25 MCG/INH AEPB INHALE 1 PUFF INTO THE LUNGS DAILY. 03/20/18  Yes Mannam, Praveen, MD  aspirin EC 81 MG tablet Take 1 tablet (81 mg total) by mouth daily. 09/20/15  Yes Florencia Reasons, MD  atorvastatin (LIPITOR) 40 MG tablet Take 40 mg by mouth every morning.  04/21/18  Yes [provider]  Newton Pigg  500 MCG TABS tablet Take 1 tablet (500 mcg total) by mouth daily. 10/23/17  Yes Magdalen Spatz, NP  diphenhydrAMINE (BENADRYL) 25 MG tablet Take 1 tablet (25 mg total) by mouth every 6 (six) hours. As needed for itching 04/07/18  Yes Triplett, Tammy, PA-C  Guaifenesin (MUCINEX MAXIMUM STRENGTH) 1200 MG TB12 Take 600 mg by mouth daily.    Yes [provider]  OXYGEN Inhale 2 L into the lungs at bedtime.    Yes [provider]  sertraline (ZOLOFT) 25 MG tablet Take 1 tablet (25 mg total) by mouth daily. 01/12/18  Yes Raylene Everts, MD  triamcinolone cream (KENALOG) 0.1 % Apply 1 application topically 3 (three) times daily. Apply to  affected areas 04/07/18  Yes Triplett, Tammy, PA-C  VENTOLIN HFA 108 (90 Base) MCG/ACT inhaler Inhale 1-2 puffs into the lungs every 6 (six) hours as needed for wheezing or shortness of breath. (200/12=17) 12/31/17  Yes Mannam, Praveen, MD  amLODipine (NORVASC) 5 MG tablet Take 1 tablet (5 mg total) by mouth daily. 05/12/18   Dorie Rank, MD  varenicline (CHANTIX STARTING MONTH PAK) 0.5 MG X 11 & 1 MG X 42 tablet Take 1 0.5 mg tablet daily for 3 days, then increase to 1 0.5 mg tablet twice daily for 4 days, then increase to 1 1 mg tablet twice daily. Patient not taking: Reported on 05/12/2018 03/18/18   Marshell Garfinkel, MD    Family History Family History  Problem Relation Age of Onset  . Arthritis Mother   . Hypertension Mother   . Stroke Mother        age 37  . Cerebral aneurysm Father   . Alcohol abuse Father   . Cancer Brother   . Diabetes Neg Hx   . Heart disease Neg Hx     Social History Social History   Tobacco Use  . Smoking status: Light Tobacco Smoker    Packs/day: 0.25    Years: 38.00    Pack years: 9.50    Types: Cigarettes  . Smokeless tobacco: Never Used  Substance Use Topics  . Alcohol use: Yes    Alcohol/week: 0.6 oz    Types: 1 Cans of beer per week    Comment: occassionally: weekends, 6-pack or less on a day; or half pint of liquior  . Drug use: Yes    Types: Marijuana, Cocaine    Comment: not current     Allergies   Apresoline [hydralazine]   Review of Systems Review of Systems  All other systems reviewed and are negative.    Physical Exam Updated Vital Signs BP (!) 134/92 (BP Location: Left Arm)   Pulse 82   Temp (!) 97.3 F (36.3 C) (Temporal)   Resp 17   Ht 1.702 m (5\' 7" )   Wt 111.1 kg (245 lb)   SpO2 98%   BMI 38.37 kg/m   Physical Exam  Constitutional: He appears well-developed and well-nourished. No distress.  HENT:  Head: Normocephalic and atraumatic.  Right Ear: External ear normal.  Left Ear: External ear normal.  Eyes:  Conjunctivae are normal. Right eye exhibits no discharge. Left eye exhibits no discharge. No scleral icterus.  Neck: Neck supple. No tracheal deviation present.  Cardiovascular: Normal rate, regular rhythm and intact distal pulses.  Pulmonary/Chest: Effort normal and breath sounds normal. No stridor. No respiratory distress. He has no wheezes. He has no rales.  Abdominal: Soft. Bowel sounds are normal. He exhibits no distension. There is no tenderness.  There is no rebound and no guarding.  Musculoskeletal: He exhibits no edema or tenderness.  Neurological: He is alert. He has normal strength. No cranial nerve deficit (no facial droop, extraocular movements intact, no slurred speech) or sensory deficit. He exhibits normal muscle tone. He displays no seizure activity. Coordination normal.  Skin: Skin is warm and dry. No rash noted.  Psychiatric: He has a normal mood and affect.  Nursing note and vitals reviewed.    ED Treatments / Results  Labs (all labs ordered are listed, but only abnormal results are displayed) Labs Reviewed  CBC WITH DIFFERENTIAL/PLATELET - Abnormal; Notable for the following components:      Result Value   WBC 12.8 (*)    RBC 5.82 (*)    Hemoglobin 18.5 (*)    HCT 55.0 (*)    Neutro Abs 8.2 (*)    All other components within normal limits  BASIC METABOLIC PANEL  TROPONIN I  CBG MONITORING, ED    EKG EKG Interpretation  Date/Time:  Tuesday May 12 2018 17:50:28 EDT Ventricular Rate:  92 PR Interval:  210 QRS Duration: 164 QT Interval:  384 QTC Calculation: 474 R Axis:   164 Text Interpretation:  Sinus rhythm with 1st degree A-V block Biatrial enlargement Right bundle branch block Septal infarct , age undetermined Abnormal ECG No significant change since last tracing Confirmed by Dorie Rank [id number]) on 05/12/2018 6:54:57 PM    Procedures Procedures (including critical care time)  Medications Ordered in ED Medications  amLODipine (NORVASC) tablet 5 mg  (has no administration in time range)     Initial Impression / Assessment and Plan / ED Course  I have reviewed the triage vital signs and the nursing notes.  Pertinent labs & imaging results that were available during my care of the patient were reviewed by me and considered in my medical decision making (see chart for details).   Patient presented to the emergency room for evaluation of diaphoresis at home.  Associated with chest pain or shortness of breath or any other symptoms.  Patient is ED work-up is reassuring.  He is asymptomatic.  Patient was noted to be hypertensive.  He admits that he has not taken his medications.  I will provide him a refill of that.  Discussed outpatient follow-up with his primary care doctor.  Encouraged to take his medications regularly.  Discussed monitoring for fever or worsening symptoms  Final Clinical Impressions(s) / ED Diagnoses   Final diagnoses:  Diaphoresis  Hypertension, unspecified type    ED Discharge Orders        Ordered    amLODipine (NORVASC) 5 MG tablet  Daily     05/12/18 2112       Dorie Rank, MD 05/12/18 2114

## 2018-05-12 NOTE — Discharge Instructions (Addendum)
Follow-up with your primary doctor next week to be rechecked.  Make sure to get medications regularly.  Monitor for fever or other worsening symptoms

## 2018-05-12 NOTE — ED Notes (Signed)
Pt ambulatory to waiting room. Pt verbalized understanding of discharge instructions.   

## 2018-05-12 NOTE — ED Triage Notes (Signed)
Patient complaining of "breaking out in a sweat" several times in the last two hours. States he was just laying on the bed during these episodes. Also states "I was laying there and I felt something on my leg and it was a bug. I don't know if it bit me or not but I have it here in this bag."

## 2018-05-12 NOTE — Telephone Encounter (Signed)
Spoke with pt, states that he has just finished a pulm rehab program and is requesting a POC to help him exert easier.  I advised pt that he would need a qualifying walk and an OV as he has not been seen in the past 30 days.  Pt scheduled for 7/15 with Beth for ov and qualifying walk for POC.  Nothing further needed at this time.

## 2018-05-14 ENCOUNTER — Encounter (HOSPITAL_COMMUNITY): Payer: Medicare Other

## 2018-05-18 ENCOUNTER — Telehealth: Payer: Self-pay | Admitting: Primary Care

## 2018-05-18 ENCOUNTER — Encounter: Payer: Self-pay | Admitting: Primary Care

## 2018-05-18 ENCOUNTER — Ambulatory Visit (INDEPENDENT_AMBULATORY_CARE_PROVIDER_SITE_OTHER): Payer: Medicare Other | Admitting: Primary Care

## 2018-05-18 VITALS — BP 114/82 | HR 88 | Ht 67.0 in | Wt 246.0 lb

## 2018-05-18 DIAGNOSIS — J438 Other emphysema: Secondary | ICD-10-CM

## 2018-05-18 DIAGNOSIS — J449 Chronic obstructive pulmonary disease, unspecified: Secondary | ICD-10-CM

## 2018-05-18 DIAGNOSIS — G4733 Obstructive sleep apnea (adult) (pediatric): Secondary | ICD-10-CM | POA: Diagnosis not present

## 2018-05-18 DIAGNOSIS — J441 Chronic obstructive pulmonary disease with (acute) exacerbation: Secondary | ICD-10-CM | POA: Diagnosis not present

## 2018-05-18 DIAGNOSIS — Z720 Tobacco use: Secondary | ICD-10-CM

## 2018-05-18 DIAGNOSIS — I10 Essential (primary) hypertension: Secondary | ICD-10-CM

## 2018-05-18 DIAGNOSIS — Z9989 Dependence on other enabling machines and devices: Secondary | ICD-10-CM | POA: Diagnosis not present

## 2018-05-18 NOTE — Patient Instructions (Addendum)
Please set patient up with [facility] mini at [facility]L [facility]  6 month follow up per Dr. Vaughan Browner in November

## 2018-05-18 NOTE — Telephone Encounter (Signed)
Have placed a new order for pt using the O2 template for the simply go mini.  Called and spoke with Corene Cornea letting him know this had been done.   Will route to [facility], NP so she will know she needs to sign off on new order so [facility] can get it taken care of for pt.

## 2018-05-18 NOTE — Assessment & Plan Note (Addendum)
-   Active smoker, 1 pack last 2 days - On Chantix, motivated to quit smoking - Support and encouragement given

## 2018-05-18 NOTE — Progress Notes (Signed)
@Patient  ID: Ricardo Burch, male    DOB: 09-03-1961, 57 y.o.   MRN: [id number]  Chief Complaint  Patient presents with  . Follow-up    Is wanting a POC to be able to get out move about easier.    Referring provider: No ref. provider found  HPI: 57 year old male, active smoker. Hx OSA , severe COPD, CHF and pulmonary edema; requiring past hospitalizations. Recently finished pulmonary rehab with improvement in dyspnea. Continues on Anoro, daliresp and supplemental oxygen.  Recent ED visit on 7/9 for diaphoresis, he was noted to be hypertensive. Patient had not taken his BP medications for a couple of days d/t no refills.   05/18/2018 Presents today to qualify for POC. Recently finished pulmonary rehab, states that he improved tremendously. He plans to attend a maintenance course which will cost him $34/month out of pocket. Pulm rehabs has encouraged him to remain active and walk, he has a handout with some home exercises to do. States that he is limited d/t his oxygen tank. Reports sob when carrying groceries or going out 1-2 flights of stairs. No recent COPD exacerbations. Requires rescue inhaler once or twice a month. He has cut back on smoking, 1 pack last him 2 days (prior 1ppd). Reviewed no smoking with 02.Taking chantix and motivated. Reviewed smoking cessation at length. He is also working on losing weight as well. Denies fever, sob at rest, cough, wheeze.    Data Reviewed: Chest x-ray 12/24/2016-mild perihilar infiltrate suggestive of pulmonary edema I have reviewed the images personally.  Echo (09/18/15) The right ventricular systolic pressure was increased consistent with moderate pulmonary hypertension.moderate concentric LVH. LVEF 60-65 percent. Grade 2 diastolic dysfunction. RV cavity size severe grade dilated. RV systolic function moderately to severely reduced. PA pressure 56  Sleep study 10/22/15  evere OSA, AHI 151. Started on an AutoSet CPAP titration study  Initiate  CPAP at 12 with 2 L oxygen, fullface mask.  CPAP compliance report 06/23/16- 07/22/16 3% usage greater than 4 hours.  PFTs 01/16/16 FVC 2.43 [58%], FEV1 1.49 [45%), F/F 61, TLC 81%,DLCO 56% Severe obstruction, moderate reduction in diffusion capacity.  CBC 08/24/17-absolute eosinophil count 254   Allergies  Allergen Reactions  . Apresoline [Hydralazine] Other (See Comments)    Headache     Immunization History  Administered Date(s) Administered  . Influenza,inj,Quad PF,6+ Mos 09/15/2017  . Pneumococcal Conjugate-13 09/15/2017  . Tdap 05/26/2015    Past Medical History:  Diagnosis Date  . Allergy   . CHF (congestive heart failure) ([place])   . CHF (congestive heart failure) ([place])   . Chronic kidney disease   . COPD (chronic obstructive pulmonary disease) ([place]) Dx 2015  . Depression   . Eczema    since childhood   . Hyperlipidemia 01/12/2018  . Hypertension   . Oxygen deficiency   . Sleep apnea    CPAP  . Substance abuse ([place])    MJ, cocaine    Tobacco History: Social History   Tobacco Use  Smoking Status Light Tobacco Smoker  . Packs/day: 0.25  . Years: 38.00  . Pack years: 9.50  . Types: Cigarettes  Smokeless Tobacco Never Used   Ready to quit: Not Answered Counseling given: Not Answered   Outpatient Medications Prior to Visit  Medication Sig Dispense Refill  . amLODipine (NORVASC) 5 MG tablet Take 1 tablet (5 mg total) by mouth daily. 90 tablet 3  . ANORO ELLIPTA 62.5-25 MCG/INH AEPB INHALE 1 PUFF INTO THE LUNGS DAILY. 60 each  0  . aspirin EC 81 MG tablet Take 1 tablet (81 mg total) by mouth daily. 30 tablet 0  . atorvastatin (LIPITOR) 40 MG tablet Take 40 mg by mouth every morning.     [name] DALIRESP 500 MCG TABS tablet Take 1 tablet (500 mcg total) by mouth daily. 30 tablet 5  . diphenhydrAMINE (BENADRYL) 25 MG tablet Take 1 tablet (25 mg total) by mouth every 6 (six) hours. As needed for itching 20 tablet 0  . Guaifenesin (MUCINEX MAXIMUM STRENGTH)  1200 MG TB12 Take 600 mg by mouth daily.     . OXYGEN Inhale 2 L into the lungs at bedtime.     . sertraline (ZOLOFT) 25 MG tablet Take 1 tablet (25 mg total) by mouth daily. 30 tablet 2  . triamcinolone cream (KENALOG) 0.1 % Apply 1 application topically 3 (three) times daily. Apply to affected areas 453.6 g 0  . varenicline (CHANTIX STARTING MONTH PAK) 0.5 MG X 11 & 1 MG X 42 tablet Take 1 0.5 mg tablet daily for 3 days, then increase to 1 0.5 mg tablet twice daily for 4 days, then increase to 1 1 mg tablet twice daily. 53 tablet 0  . VENTOLIN HFA 108 (90 Base) MCG/ACT inhaler Inhale 1-2 puffs into the lungs every 6 (six) hours as needed for wheezing or shortness of breath. (200/12=17) 18 g 2   No facility-administered medications prior to visit.     Review of Systems  Review of Systems  Constitutional: Negative.   HENT: Negative.   Respiratory: Negative.   Cardiovascular: Negative.     Physical Exam  BP 114/82 (BP Location: Left Arm, Cuff Size: Normal)   Pulse 88   Ht 5\' 7"  (1.702 m)   Wt 246 lb (111.6 kg)   SpO2 90%   BMI 38.53 kg/m  Physical Exam  Constitutional: He is oriented to person, place, and time. He appears well-developed and well-nourished.  HENT:  Head: Normocephalic and atraumatic.  Neck: Normal range of motion. Neck supple.  Cardiovascular: Normal rate and regular rhythm.  Pulmonary/Chest: Effort normal. No accessory muscle usage. No respiratory distress. He has decreased breath sounds. He has no wheezes.  Neurological: He is alert and oriented to person, place, and time.  Skin: Skin is warm and dry.  Psychiatric: He has a normal mood and affect. His speech is normal and behavior is normal.     Lab Results:  CBC    Component Value Date/Time   WBC 12.8 (H) 05/12/2018 1803   RBC 5.82 (H) 05/12/2018 1803   HGB 18.5 (H) 05/12/2018 1803   HCT 55.0 (H) 05/12/2018 1803   PLT 185 05/12/2018 1803   MCV 94.5 05/12/2018 1803   MCH 31.8 05/12/2018 1803   MCHC  33.6 05/12/2018 1803   RDW 13.9 05/12/2018 1803   LYMPHSABS 3.4 05/12/2018 1803   MONOABS 1.0 05/12/2018 1803   EOSABS 0.2 05/12/2018 1803   BASOSABS 0.0 05/12/2018 1803    BMET    Component Value Date/Time   NA 137 05/12/2018 1803   K 4.1 05/12/2018 1803   CL 99 05/12/2018 1803   CO2 32 05/12/2018 1803   GLUCOSE 98 05/12/2018 1803   BUN 12 05/12/2018 1803   CREATININE 0.85 05/12/2018 1803   CREATININE 0.87 10/14/2017 1141   CALCIUM 8.9 05/12/2018 1803   GFRNONAA >60 05/12/2018 1803   GFRNONAA 98 10/14/2017 1141   GFRAA >60 05/12/2018 1803   GFRAA 113 10/14/2017 1141    BNP  Component Value Date/Time   BNP 20.0 12/24/2016 1045    Imaging: No results found.   Assessment & Plan:   OSA on CPAP Compliant with CPAP, tolerating. Wears 02 at night.    COPD (chronic obstructive pulmonary disease) (HCC) - Stable, no recent exacerbations - Continues Anoro, daliresp and supplement 02 - Completed pulm rehab, encouraged to remain active - Desat 88% RA with amb, 2L pulse placed and level >92% - Plan SimplyGO mini for activity  - Reviewed NO smoking with oxygen on - Smoking cessation reviewed at length, on Chantix and motivated   Tobacco abuse - Active smoker, 1 pack last 2 days - On Chantix, motivated to quit smoking - Support and encouragement given   Hypertension - Stable; BP 114/82 - Recent ED visit d/t diaphoresis, found to be hypertensive (patient ran out of BP medication, refills given)     Martyn Ehrich, NP 05/18/2018

## 2018-05-18 NOTE — Assessment & Plan Note (Signed)
-   Stable; BP 114/82 - Recent ED visit d/t diaphoresis, found to be hypertensive (patient ran out of BP medication, refills given)

## 2018-05-18 NOTE — Assessment & Plan Note (Addendum)
Compliant with CPAP, tolerating. Wears 02 at night.

## 2018-05-18 NOTE — Assessment & Plan Note (Signed)
-   Stable, no recent exacerbations - Continues Anoro, daliresp and supplement 02 - Completed pulm rehab, encouraged to remain active - Desat 88% RA with amb, 2L pulse placed and level >92% - Plan SimplyGO mini for activity  - Reviewed NO smoking with oxygen on - Smoking cessation reviewed at length, on Chantix and motivated

## 2018-05-20 NOTE — Telephone Encounter (Signed)
Attempted to call Corene Cornea with [facility] at [phone] ext [phone]. Corene Cornea is out of the office 05/20/18 through 05/26/2018. I did not receive an answer at time of call. I have left a voicemail message for pt to return call. X1  Called spoke with Ailene Ravel with [facility] [phone] Ext [phone] Advised her that O2 order placed by [facility] 05/18/18 was signed by Derl Barrow. She advised that she sees the order, she will call pt today.  LVM for pt regarding above info, that [facility] will be calling her in next 48hrs regarding her O2 order.

## 2018-05-25 ENCOUNTER — Encounter: Payer: Medicare Other | Attending: Internal Medicine | Admitting: Nutrition

## 2018-05-25 VITALS — Ht 67.0 in | Wt 248.8 lb

## 2018-05-25 DIAGNOSIS — Z713 Dietary counseling and surveillance: Secondary | ICD-10-CM | POA: Insufficient documentation

## 2018-05-25 DIAGNOSIS — E669 Obesity, unspecified: Secondary | ICD-10-CM

## 2018-05-25 DIAGNOSIS — E782 Mixed hyperlipidemia: Secondary | ICD-10-CM

## 2018-05-25 DIAGNOSIS — R7303 Prediabetes: Secondary | ICD-10-CM | POA: Insufficient documentation

## 2018-05-25 NOTE — Patient Instructions (Addendum)
Goals 1. Walk 30 minutes 4-5 times per week at the [facility] or [employer] 2. Get back on track with more fruits and vegetables.  3. Drink only water- gallon a day. 4. Avoid eating while watching TV. 5. Walk in place while  Watching TV.

## 2018-05-25 NOTE — Progress Notes (Signed)
Medical Nutrition Therapy:  Appt start time: 1630 end time:  1645 Assessment:  Primary concerns today: Obesity. H/o Prediabetes. A1C had been above 5.7% but is now 5.3%.   Lost 6 lbs. Has been watching what he is eating. Eating out less and less processed food. Is less hungry.   Has finished Pulmonary Rehab. Breathing is much better He lost his job and has a [place] set back with his diet and weight. But he is motivated to improve his food choices and exercise.   Still not smoking. He has quit smoking 5th of July!!! GOes to see PCP in August to recheck labs with Dr. Hazeline Junker.   Diet improving slowing.   Lab Results  Component Value Date   HGBA1C 5.3 10/14/2017   Lipid Panel     Component Value Date/Time   CHOL 136 10/14/2017 1141   TRIG 82 10/14/2017 1141   HDL 60 10/14/2017 1141   CHOLHDL 2.3 10/14/2017 1141   LDLCALC 60 10/14/2017 1141     CMP Latest Ref Rng & Units 05/12/2018 10/14/2017 08/24/2017  Glucose 70 - 99 mg/dL 98 91 91  BUN 6 - 20 mg/dL 12 6(L) 11  Creatinine 0.61 - 1.24 mg/dL 0.85 0.87 0.92  Sodium 135 - 145 mmol/L 137 135 136  Potassium 3.5 - 5.1 mmol/L 4.1 4.6 4.3  Chloride 98 - 111 mmol/L 99 96(L) 97(L)  CO2 22 - 32 mmol/L 32 34(H) 30  Calcium 8.9 - 10.3 mg/dL 8.9 9.2 8.9  Total Protein 6.1 - 8.1 g/dL - 7.4 -  Total Bilirubin 0.2 - 1.2 mg/dL - 0.9 -  Alkaline Phos 38 - 126 U/L - - -  AST 10 - 35 U/L - 14 -  ALT 9 - 46 U/L - 11 -    Preferred Learning Style:  No preference indicated   Learning Readiness:     Ready  Change in progress   MEDICATIONS:    DIETARY INTAKE:    24-hr recall:  B ( AM): Raisin bran and 2% milk milk; eggs and ww toast and fruit, Snk ( AM):  L ( PM):  Leftover or sanwich,  And vegetables.  Snk ( PM)   D ( PM): Baked chicken, frozen vegetables and wheat bread, water Snk ( PM):  Beverages: water    Usual physical activity: Walking some    Estimated energy needs: 1800  calories 200 g carbohydrates 135 g  protein 50 g fat  Progress Towards Goal(s):  In progress.   Nutritional Diagnosis:  NB-1.1 Food and nutrition-related knowledge deficit As related to Obesity.  As evidenced by BMI > 30.    Intervention: Nutrition and Prediabetes education provided on My Plate, CHO counting, meal planning, portion sizes, timing of meals, avoiding snacks between meals  taking medications as prescribed, benefits of exercising 30 minutes per day and prevention of  DM. Stressed need to cut out processed and fast foods, sweets, junk food and focus more on nutrient dense foods. Drink only water.   Goals 1. Walk 30 minutes 4-5 times per week at the [facility] or [employer] 2. Get back on track with more fruits and vegetables.  3. Drink only water- gallon a day. 4. Avoid eating while watching TV. 5. Walk in place while watching TV.  Teaching Method Utilized:  Visual Auditory Hands on  Handouts given during visit include:  The Plate Method   Meal Plan Card    Barriers to learning/adherence to lifestyle change: none  Demonstrated degree of understanding via:  Teach Back   Monitoring/Evaluation:  Dietary intake, exercise, meal planning, and body weight in 1 month(s).

## 2018-05-26 ENCOUNTER — Encounter: Payer: Self-pay | Admitting: Nutrition

## 2018-05-27 ENCOUNTER — Telehealth: Payer: Self-pay | Admitting: Pulmonary Disease

## 2018-05-27 ENCOUNTER — Other Ambulatory Visit: Payer: Self-pay | Admitting: Pulmonary Disease

## 2018-05-27 MED ORDER — UMECLIDINIUM-VILANTEROL 62.5-25 MCG/INH IN AEPB
INHALATION_SPRAY | RESPIRATORY_TRACT | 5 refills | Status: DC
Start: 2018-05-27 — End: 2018-05-27

## 2018-05-27 NOTE — Telephone Encounter (Signed)
LMTC x 1  

## 2018-05-27 NOTE — Telephone Encounter (Signed)
Pt called back-states his O2 levels are holding around 93-94% on 2l continuous flow. States he is having nasal congestion only-no drainage or cough. No SOB noted. Pt states the nasal congestion is worse at night and would like to know what is safe enough to use with his other medications he is taking.   Pt okayed to leave detail message on voicemail if he does not answer when calling back.

## 2018-05-27 NOTE — Telephone Encounter (Signed)
Ok to use flonase OTC

## 2018-05-27 NOTE — Telephone Encounter (Signed)
Attempted to call patient today regarding Dr. Matilde Bash recommendations. I did not receive an answer at time of call. I have left a voicemail message for pt to return call. X1

## 2018-05-27 NOTE — Telephone Encounter (Signed)
Called and spoke with patient regarding results.  Informed the patient of Dr. Matilde Bash recommendations today. Pt verbalized understanding and denied any questions or concerns at this time.  Nothing further needed.

## 2018-05-27 NOTE — Telephone Encounter (Signed)
Spoke with the pt and advised rx for anoro was refilled  Nothing further needed

## 2018-06-01 DIAGNOSIS — E559 Vitamin D deficiency, unspecified: Secondary | ICD-10-CM | POA: Diagnosis not present

## 2018-06-01 DIAGNOSIS — F524 Premature ejaculation: Secondary | ICD-10-CM | POA: Diagnosis not present

## 2018-06-01 DIAGNOSIS — E785 Hyperlipidemia, unspecified: Secondary | ICD-10-CM | POA: Diagnosis not present

## 2018-06-01 DIAGNOSIS — I10 Essential (primary) hypertension: Secondary | ICD-10-CM | POA: Diagnosis not present

## 2018-06-01 DIAGNOSIS — Z125 Encounter for screening for malignant neoplasm of prostate: Secondary | ICD-10-CM | POA: Diagnosis not present

## 2018-06-01 DIAGNOSIS — R5383 Other fatigue: Secondary | ICD-10-CM | POA: Diagnosis not present

## 2018-06-01 DIAGNOSIS — N529 Male erectile dysfunction, unspecified: Secondary | ICD-10-CM | POA: Diagnosis not present

## 2018-06-01 DIAGNOSIS — R7302 Impaired glucose tolerance (oral): Secondary | ICD-10-CM | POA: Diagnosis not present

## 2018-06-12 ENCOUNTER — Other Ambulatory Visit: Payer: Self-pay | Admitting: Acute Care

## 2018-06-12 DIAGNOSIS — J438 Other emphysema: Secondary | ICD-10-CM

## 2018-06-15 NOTE — Telephone Encounter (Signed)
Patient is calling and wanted to speak to someone about medication. CB is [phone].

## 2018-06-18 ENCOUNTER — Other Ambulatory Visit: Payer: Self-pay | Admitting: Pulmonary Disease

## 2018-06-18 DIAGNOSIS — J438 Other emphysema: Secondary | ICD-10-CM

## 2018-06-18 NOTE — Telephone Encounter (Signed)
Called [employer], they said they were aware that is his script was at [facility]. It was too expensive and he was declined for a credit card so he wanted it to be at [place] because they offer him monthly payments.  He is trying to borrow money and if he can not, [facility] will transfere script to them.

## 2018-07-07 ENCOUNTER — Other Ambulatory Visit: Payer: Self-pay | Admitting: Family Medicine

## 2018-07-07 ENCOUNTER — Other Ambulatory Visit: Payer: Self-pay | Admitting: Pulmonary Disease

## 2018-07-08 ENCOUNTER — Telehealth: Payer: Self-pay | Admitting: Pulmonary Disease

## 2018-07-08 NOTE — Telephone Encounter (Signed)
Ok to do use the POC with CPAP for 1 night.

## 2018-07-08 NOTE — Telephone Encounter (Signed)
Spoke with patient, patient he is going out of town for 1 night and wanted to know if he can use his simply go minnie to bleed oxygen into his cpap while he is gone. Patient states if unable to do so he will need oxygen tanks from [facility] because they took them away when they gave him the simple go minnie.   MD Mannam please advise.

## 2018-07-08 NOTE — Telephone Encounter (Signed)
Left voicemail informing patient that it is okay for him to use his POC for his Cpap for 1 night. If he plans on being away for longer than 1 night he needs to call office so we can get him some oxygen tanks. Nothing further needed at this time.

## 2018-07-15 ENCOUNTER — Emergency Department (HOSPITAL_COMMUNITY)
Admission: EM | Admit: 2018-07-15 | Discharge: 2018-07-15 | Disposition: A | Discharge status: Home / Self Care | Payer: Medicare Other | Attending: Emergency Medicine | Admitting: Emergency Medicine

## 2018-07-15 ENCOUNTER — Other Ambulatory Visit: Payer: Self-pay

## 2018-07-15 ENCOUNTER — Encounter (HOSPITAL_COMMUNITY): Payer: Self-pay | Admitting: Emergency Medicine

## 2018-07-15 DIAGNOSIS — R519 Headache, unspecified: Secondary | ICD-10-CM

## 2018-07-15 DIAGNOSIS — Y9222 Religious institution as the place of occurrence of the external cause: Secondary | ICD-10-CM | POA: Diagnosis not present

## 2018-07-15 DIAGNOSIS — S1096XA Insect bite of unspecified part of neck, initial encounter: Secondary | ICD-10-CM

## 2018-07-15 DIAGNOSIS — I509 Heart failure, unspecified: Secondary | ICD-10-CM | POA: Diagnosis not present

## 2018-07-15 DIAGNOSIS — R51 Headache: Secondary | ICD-10-CM | POA: Diagnosis not present

## 2018-07-15 DIAGNOSIS — Z7982 Long term (current) use of aspirin: Secondary | ICD-10-CM | POA: Insufficient documentation

## 2018-07-15 DIAGNOSIS — Z79899 Other long term (current) drug therapy: Secondary | ICD-10-CM | POA: Insufficient documentation

## 2018-07-15 DIAGNOSIS — N189 Chronic kidney disease, unspecified: Secondary | ICD-10-CM | POA: Insufficient documentation

## 2018-07-15 DIAGNOSIS — F1410 Cocaine abuse, uncomplicated: Secondary | ICD-10-CM | POA: Insufficient documentation

## 2018-07-15 DIAGNOSIS — W57XXXA Bitten or stung by nonvenomous insect and other nonvenomous arthropods, initial encounter: Secondary | ICD-10-CM | POA: Insufficient documentation

## 2018-07-15 DIAGNOSIS — I130 Hypertensive heart and chronic kidney disease with heart failure and stage 1 through stage 4 chronic kidney disease, or unspecified chronic kidney disease: Secondary | ICD-10-CM | POA: Insufficient documentation

## 2018-07-15 DIAGNOSIS — F17210 Nicotine dependence, cigarettes, uncomplicated: Secondary | ICD-10-CM | POA: Diagnosis not present

## 2018-07-15 DIAGNOSIS — F1010 Alcohol abuse, uncomplicated: Secondary | ICD-10-CM | POA: Insufficient documentation

## 2018-07-15 DIAGNOSIS — Y998 Other external cause status: Secondary | ICD-10-CM | POA: Insufficient documentation

## 2018-07-15 DIAGNOSIS — J449 Chronic obstructive pulmonary disease, unspecified: Secondary | ICD-10-CM | POA: Insufficient documentation

## 2018-07-15 DIAGNOSIS — Y9389 Activity, other specified: Secondary | ICD-10-CM | POA: Insufficient documentation

## 2018-07-15 DIAGNOSIS — I2510 Atherosclerotic heart disease of native coronary artery without angina pectoris: Secondary | ICD-10-CM | POA: Diagnosis not present

## 2018-07-15 NOTE — Discharge Instructions (Addendum)
Your blood pressure is 128/63.  Doubt that your headache is related to elevated blood pressure.  Your examination shows sinus congestion.  I suspect that your headache is sinus related.  The insect bite behind your ear and on your neck seems to be stable.  I see no signs of infection in the skin.  And I see no signs of emergent/allergic reaction.  You may use Benadryl gel to the bites.  Continue your Flonase.  You may add saline spray to help with the congestion.  Please discuss this with your primary physician if this problem continues.

## 2018-07-15 NOTE — ED Triage Notes (Signed)
Pt reports "bit by something" while sitting in church on Sunday. Pt reports intermittent headache. nad noted.   [place] red raised area noted to posterior right ear.

## 2018-07-15 NOTE — ED Provider Notes (Signed)
[facility] EMERGENCY DEPARTMENT Provider Note   CSN: [id number] Arrival date & time: 07/15/18  1610     History   Chief Complaint Chief Complaint  Patient presents with  . Insect Bite    HPI Ricardo Burch is a 57 y.o. male.  Patient is a 57 year old male who presents to the emergency department following an insect bite.  The patient states that Sunday, September 8 he was in church, when something bit him on the back of the right ear.  He had some itching and scratched a little bit, and later noted some increased redness and swelling and he thinks what may have been some hives.  There was no rash of any kind anywhere else.  The patient did not have any other reaction.  There was no shortness of breath, no swelling of the face or oropharynx, no difficulty with breathing, no other problems.  The patient developed some nasal congestion over the next few days.  The patient had some pain of his head on the same right side.  The family was concerned that there is pain in the head might be related to the insect bite or something else more serious and wanted the patient to come to the emergency department for evaluation.  There was no vision changes.  No speech changes.  No coordination changes.  No balance changes.  The history is provided by the patient and the spouse.    Past Medical History:  Diagnosis Date  . Allergy   . CHF (congestive heart failure) ([place])   . CHF (congestive heart failure) ([place])   . Chronic kidney disease   . COPD (chronic obstructive pulmonary disease) ([place]) Dx 2015  . Depression   . Eczema    since childhood   . Hyperlipidemia 01/12/2018  . Hypertension   . Oxygen deficiency   . Sleep apnea    CPAP  . Substance abuse ([place])    MJ, cocaine    Patient Active Problem List   Diagnosis Date Noted  . Premature ejaculation 01/12/2018  . Hyperlipidemia 01/12/2018  . Hypertension 10/14/2017  . Aortic atherosclerosis ([place]) 09/15/2017  . Pre-diabetes 09/15/2017    . Cocaine abuse ([place]) 07/26/2016  . Depression 07/26/2016  . OSA on CPAP 07/26/2016  . CHF (congestive heart failure) ([place]) 06/27/2016  . Alcohol abuse 06/27/2016  . COPD (chronic obstructive pulmonary disease) ([place]) 08/23/2014  . Eczema 08/23/2014  . Right knee pain 08/23/2014  . Tobacco abuse 03/27/2014    Past Surgical History:  Procedure Laterality Date  . COLONOSCOPY WITH PROPOFOL N/A 06/25/2017   Procedure: COLONOSCOPY WITH PROPOFOL;  Surgeon: Daneil Dolin, MD;  [facility];  Service: Endoscopy;  Laterality: N/A;  2:00pm  . MULTIPLE EXTRACTIONS WITH ALVEOLOPLASTY N/A 03/22/2016   Procedure: MULTIPLE EXTRACTION WITH ALVEOLOPLASTY;  Surgeon: Diona Browner, DDS;  Location: [facility];  Service: Oral Surgery;  Laterality: N/A;  . NO PAST SURGERIES    . POLYPECTOMY  06/25/2017   Procedure: POLYPECTOMY;  Surgeon: Daneil Dolin, MD;  [facility];  Service: Endoscopy;;  colon        Home Medications    Prior to Admission medications   Medication Sig Start Date End Date Taking? Authorizing Provider  amLODipine (NORVASC) 5 MG tablet Take 1 tablet (5 mg total) by mouth daily. 05/12/18   Dorie Rank, MD  ANORO ELLIPTA 62.5-25 MCG/INH AEPB INHALE 1 PUFF INTO THE LUNGS DAILY. 05/28/18   Marshell Garfinkel, MD  aspirin EC 81 MG  tablet Take 1 tablet (81 mg total) by mouth daily. 09/20/15   Florencia Reasons, MD  atorvastatin (LIPITOR) 40 MG tablet Take 40 mg by mouth every morning.  04/21/18   [provider]  DALIRESP 500 MCG TABS tablet TAKE 1 TABLET BY MOUTH DAILY. 06/16/18   Martyn Ehrich, NP  diphenhydrAMINE (BENADRYL) 25 MG tablet Take 1 tablet (25 mg total) by mouth every 6 (six) hours. As needed for itching 04/07/18   Triplett, Tammy, PA-C  Guaifenesin (MUCINEX MAXIMUM STRENGTH) 1200 MG TB12 Take 600 mg by mouth daily.     [provider]  OXYGEN Inhale 2 L into the lungs at bedtime.     [provider]  sertraline (ZOLOFT) 25 MG tablet Take 1  tablet (25 mg total) by mouth daily. 01/12/18   Raylene Everts, MD  triamcinolone cream (KENALOG) 0.1 % Apply 1 application topically 3 (three) times daily. Apply to affected areas 04/07/18   Triplett, Tammy, PA-C  varenicline (CHANTIX STARTING MONTH PAK) 0.5 MG X 11 & 1 MG X 42 tablet Take 1 0.5 mg tablet daily for 3 days, then increase to 1 0.5 mg tablet twice daily for 4 days, then increase to 1 1 mg tablet twice daily. 03/18/18   Mannam, Praveen, MD  VENTOLIN HFA 108 (90 Base) MCG/ACT inhaler INHALE 1 OR 2 PUFFS BY MOUTH EVERY 6 HOURS AS NEEDED FOR WHEEZING OR SHORTNESS OF BREATH 07/08/18   Marshell Garfinkel, MD    Family History Family History  Problem Relation Age of Onset  . Arthritis Mother   . Hypertension Mother   . Stroke Mother        age 39  . Cerebral aneurysm Father   . Alcohol abuse Father   . Cancer Brother   . Diabetes Neg Hx   . Heart disease Neg Hx     Social History Social History   Tobacco Use  . Smoking status: Light Tobacco Smoker    Packs/day: 0.25    Years: 38.00    Pack years: 9.50    Types: Cigarettes  . Smokeless tobacco: Never Used  Substance Use Topics  . Alcohol use: Yes    Alcohol/week: 1.0 standard drinks    Types: 1 Cans of beer per week    Comment: occassionally: weekends, 6-pack or less on a day; or half pint of liquior  . Drug use: Yes    Types: Marijuana, Cocaine    Comment: not current     Allergies   Apresoline [hydralazine]   Review of Systems Review of Systems   Physical Exam Updated Vital Signs BP 128/63 (BP Location: Right Arm)   Pulse (!) 108   Temp 98.9 F (37.2 C) (Oral)   Resp 18   Ht 5\' 7"  (1.702 m)   Wt 112.5 kg   SpO2 90% Comment: pt reports is "supposed to be on oxygen all the time". pt entered triage with no oxygen. pt room air saturation 84%-89%. pt given oxygen 2 liters, o2 saturation 90%.  BMI 38.84 kg/m   Physical Exam  Constitutional: He is oriented to person, place, and time. He appears  well-developed and well-nourished.  Non-toxic appearance.  HENT:  Head: Normocephalic.  Right Ear: Tympanic membrane and external ear normal.  Left Ear: Tympanic membrane and external ear normal.  There are 3 of 4 red raised areas behind the right ear.  There is minimal increased warmth present.  There is no red streaks noted.  Nasal congestion present.  Eyes:  Pupils are equal, round, and reactive to light. EOM and lids are normal.  Neck: Normal range of motion. Neck supple. Carotid bruit is not present.  Cardiovascular: Normal rate, regular rhythm, normal heart sounds, intact distal pulses and normal pulses.  Pulmonary/Chest: Breath sounds normal. No respiratory distress.  Abdominal: Soft. Bowel sounds are normal. There is no tenderness. There is no guarding.  Musculoskeletal: Normal range of motion.  Lymphadenopathy:       Head (right side): No submandibular adenopathy present.       Head (left side): No submandibular adenopathy present.    He has no cervical adenopathy.  Neurological: He is alert and oriented to person, place, and time. He has normal strength. No cranial nerve deficit or sensory deficit. He exhibits normal muscle tone. Coordination normal.  No gross neurologic deficit appreciated.  Skin: Skin is warm and dry.  Psychiatric: He has a normal mood and affect. His speech is normal.  Nursing note and vitals reviewed.    ED Treatments / Results  Labs (all labs ordered are listed, but only abnormal results are displayed) Labs Reviewed - No data to display  EKG None  Radiology No results found.  Procedures Procedures (including critical care time)  Medications Ordered in ED Medications - No data to display   Initial Impression / Assessment and Plan / ED Course  I have reviewed the triage vital signs and the nursing notes.  Pertinent labs & imaging results that were available during my care of the patient were reviewed by me and considered in my medical  decision making (see chart for details).       Final Clinical Impressions(s) / ED Diagnoses MDM  Vital signs reviewed.  No gross neurologic deficits appreciated on examination.  No evidence of any systemic or anaphylactic type reaction from the bites.  I have reassured the patient and the family that the patient had an insect bite and he has some nasal congestion with some sinus fullness and possibly a sinus headache.  I have asked the patient to use the decongesting medication.  To use Tylenol every 4 hours for discomfort   Final diagnoses:  Insect bite of neck, initial encounter  Sinus headache    ED Discharge Orders    None       Lily Kocher, PA-C 07/16/18 0010    Nat Christen, MD 07/16/18 2236

## 2018-07-20 ENCOUNTER — Other Ambulatory Visit: Payer: Self-pay | Admitting: Pulmonary Disease

## 2018-07-20 DIAGNOSIS — J438 Other emphysema: Secondary | ICD-10-CM

## 2018-07-21 DIAGNOSIS — J449 Chronic obstructive pulmonary disease, unspecified: Secondary | ICD-10-CM | POA: Diagnosis not present

## 2018-07-23 DIAGNOSIS — R5383 Other fatigue: Secondary | ICD-10-CM | POA: Diagnosis not present

## 2018-07-23 DIAGNOSIS — Z23 Encounter for immunization: Secondary | ICD-10-CM | POA: Diagnosis not present

## 2018-07-23 DIAGNOSIS — J449 Chronic obstructive pulmonary disease, unspecified: Secondary | ICD-10-CM | POA: Diagnosis not present

## 2018-07-23 DIAGNOSIS — I10 Essential (primary) hypertension: Secondary | ICD-10-CM | POA: Diagnosis not present

## 2018-07-27 ENCOUNTER — Ambulatory Visit: Payer: Medicare Other | Admitting: Nutrition

## 2018-08-10 ENCOUNTER — Telehealth: Payer: Self-pay | Admitting: Pulmonary Disease

## 2018-08-10 DIAGNOSIS — J438 Other emphysema: Secondary | ICD-10-CM

## 2018-08-10 MED ORDER — ROFLUMILAST 500 MCG PO TABS: ORAL_TABLET | ORAL | 2 refills | Status: DC

## 2018-08-10 NOTE — Telephone Encounter (Signed)
Spoke with pt. He is needing a refill on Daliresp. Rx has been sent in. Nothing further was needed.

## 2018-08-11 ENCOUNTER — Telehealth: Payer: Self-pay | Admitting: Pulmonary Disease

## 2018-08-11 DIAGNOSIS — J438 Other emphysema: Secondary | ICD-10-CM

## 2018-08-11 MED ORDER — ROFLUMILAST 500 MCG PO TABS: ORAL_TABLET | ORAL | 5 refills | Status: DC

## 2018-08-11 NOTE — Telephone Encounter (Signed)
Called patients insurance and was on hold for over 10 minutes. Will call back.

## 2018-08-11 NOTE — Telephone Encounter (Signed)
Called [employer]. Unable to reach anyone. Was left on hold for 17 minutes. Will leave in triage to call back.

## 2018-08-11 NOTE — Telephone Encounter (Signed)
Medication name and strength: Daliresp 573mcg PA approved/denied: Approved Approval dates: today through 11/04/2019 If denied, reason for denial:  Your request has been approved Request Reference Number: [id number]. DALIRESP TAB 500MCG is approved through 11/04/2019. For further questions, call [phone].

## 2018-08-11 NOTE — Telephone Encounter (Signed)
Called pt's pharmacy and spoke with Vaughan Basta letting her know prior Josem Kaufmann was approved for pt's daliresp.  Per Vaughan Basta, she was able to see the approval for the prior auth and stated she was going to fill med for pt.  Per Vaughan Basta, pt has already picked up med at another pharmacy. Nothing further needed.

## 2018-08-11 NOTE — Telephone Encounter (Signed)
Medication name and strength: Daliresp 564mcg Provider: [facility] Pharmacy: [facility] Patient insurance ID: [id number] Phone: [phone] Fax: [phone]  Was the PA started on CMM?  y If yes, please enter the Key: [id number] Timeframe for approval/denial:

## 2018-08-11 NOTE — Telephone Encounter (Signed)
Called and spoke to patient, medication sent to correct pharmacy. Phone [facility] and cancelled order as well. Spoke with Tia. Nothing further is needed at this time.

## 2018-08-17 ENCOUNTER — Telehealth: Payer: Self-pay | Admitting: Pulmonary Disease

## 2018-08-17 DIAGNOSIS — H1033 Unspecified acute conjunctivitis, bilateral: Secondary | ICD-10-CM | POA: Diagnosis not present

## 2018-08-17 NOTE — Telephone Encounter (Signed)
Previous message was closed in error.  Per Dr. Vaughan Browner, pt can try flonase and take it bid to help with the nasal congestion.  Attempted to call pt but line went straight to VM. Left message for pt to return call x1  Once pt calls back, we will send Rx of flonase to his preferred pharmacy.

## 2018-08-17 NOTE — Telephone Encounter (Signed)
Try flonase bid

## 2018-08-17 NOTE — Telephone Encounter (Signed)
Attempted to call pt but no answer.  Left message for pt to return call x1 

## 2018-08-17 NOTE — Telephone Encounter (Signed)
Called and spoke with pt who states he has had congestion in his chest and sinuses which he states mucus is clear  Pt states overnight he has problems with nasal passages being stopped up and was having difficulty sleeping even with the cpap due to the congestion.  Pt has been taking the mucinex but that is not really helping with the congestion.  Pt denies any fever.  Pt states symptoms have been bad x4 days  Dr. Vaughan Browner, please advise recommendations for pt. Thanks!

## 2018-08-18 MED ORDER — FLUTICASONE PROPIONATE 50 MCG/ACT NA SUSP: 2.0000 | Freq: Two times a day (BID) | NASAL | 2 refills | Status: DC

## 2018-08-18 NOTE — Telephone Encounter (Signed)
Called and spoke with pt letting him know Dr. Vaughan Browner said he could take flonase nasal spray to see if that would help with the nasal congestion.  Pt expressed understanding. Rx sent to pt's preferred pharmacy. Nothing further needed.

## 2018-08-20 DIAGNOSIS — J449 Chronic obstructive pulmonary disease, unspecified: Secondary | ICD-10-CM | POA: Diagnosis not present

## 2018-08-22 DIAGNOSIS — J449 Chronic obstructive pulmonary disease, unspecified: Secondary | ICD-10-CM | POA: Diagnosis not present

## 2018-09-02 ENCOUNTER — Other Ambulatory Visit: Payer: Self-pay | Admitting: Pulmonary Disease

## 2018-09-03 DIAGNOSIS — I10 Essential (primary) hypertension: Secondary | ICD-10-CM | POA: Diagnosis not present

## 2018-09-03 DIAGNOSIS — R5383 Other fatigue: Secondary | ICD-10-CM | POA: Diagnosis not present

## 2018-09-08 ENCOUNTER — Ambulatory Visit: Payer: Medicare Other | Admitting: Nutrition

## 2018-09-10 DIAGNOSIS — J9690 Respiratory failure, unspecified, unspecified whether with hypoxia or hypercapnia: Secondary | ICD-10-CM | POA: Diagnosis not present

## 2018-09-10 DIAGNOSIS — J449 Chronic obstructive pulmonary disease, unspecified: Secondary | ICD-10-CM | POA: Diagnosis not present

## 2018-09-10 DIAGNOSIS — G4733 Obstructive sleep apnea (adult) (pediatric): Secondary | ICD-10-CM | POA: Diagnosis not present

## 2018-09-14 ENCOUNTER — Ambulatory Visit (INDEPENDENT_AMBULATORY_CARE_PROVIDER_SITE_OTHER): Payer: Medicare Other | Admitting: Pulmonary Disease

## 2018-09-14 ENCOUNTER — Ambulatory Visit (INDEPENDENT_AMBULATORY_CARE_PROVIDER_SITE_OTHER)
Admission: RE | Admit: 2018-09-14 | Discharge: 2018-09-14 | Disposition: A | Discharge status: Home / Self Care | Payer: Medicare Other | Source: Ambulatory Visit | Attending: Pulmonary Disease | Admitting: Pulmonary Disease

## 2018-09-14 ENCOUNTER — Encounter: Payer: Self-pay | Admitting: Pulmonary Disease

## 2018-09-14 VITALS — BP 122/70 | HR 81 | Ht 67.0 in | Wt 259.2 lb

## 2018-09-14 DIAGNOSIS — J449 Chronic obstructive pulmonary disease, unspecified: Secondary | ICD-10-CM

## 2018-09-14 DIAGNOSIS — Z9989 Dependence on other enabling machines and devices: Secondary | ICD-10-CM | POA: Diagnosis not present

## 2018-09-14 DIAGNOSIS — G4733 Obstructive sleep apnea (adult) (pediatric): Secondary | ICD-10-CM

## 2018-09-14 NOTE — Progress Notes (Signed)
Merlin Golden    [id number]    Mar 08, 1961  Primary Care Physician:Patient, No Pcp Per  Referring Physician: No referring provider defined for this encounter.  Chief complaint:   Follow up for Severe COPD Severe OSA on autoset Active smoker  HPI: Mr. Flemmer is a 57 year old active smoker, COPD. He's had multiple hospitalizations with COPD, CHF exacerbations in the past.He was hospitalized from 07/26/16-07/30/16 with acute pulmonary edema, acute exacerbation of COPD. He was intubated briefly for hypercarbia.   He has finished the pulmonary rehab and reports improved dyspnea. He has been on a rotating list of inhalers mainly due to his inability to pay co-pay. [name] He has been started on CPAP after a repeat titration study and is tolerating it well.  Interim History: Stable since last visit.  Continues on anoro, Daliresp Family quits smoking and is determined not to go back again  Complains of chronic cough with chest congestion, dyspnea on exertion.  Outpatient Encounter Medications as of 09/14/2018  Medication Sig  . amLODipine (NORVASC) 5 MG tablet Take 1 tablet (5 mg total) by mouth daily.  Jearl Klinefelter ELLIPTA 62.5-25 MCG/INH AEPB INHALE 1 PUFF INTO THE LUNGS DAILY.  [name] aspirin EC 81 MG tablet Take 1 tablet (81 mg total) by mouth daily.  [name] atorvastatin (LIPITOR) 40 MG tablet Take 40 mg by mouth every morning.   . diphenhydrAMINE (BENADRYL) 25 MG tablet Take 1 tablet (25 mg total) by mouth every 6 (six) hours. As needed for itching  . fluticasone (FLONASE) 50 MCG/ACT nasal spray Place 2 sprays into both nostrils 2 (two) times daily.  . OXYGEN Inhale 2 L into the lungs at bedtime.   . roflumilast (DALIRESP) 500 MCG TABS tablet Take 1 tablet by mouth daily  . triamcinolone cream (KENALOG) 0.1 % Apply 1 application topically 3 (three) times daily. Apply to affected areas  . varenicline (CHANTIX STARTING MONTH PAK) 0.5 MG X 11 & 1 MG X 42 tablet Take 1 0.5 mg tablet daily for 3 days,  then increase to 1 0.5 mg tablet twice daily for 4 days, then increase to 1 1 mg tablet twice daily.  . VENTOLIN HFA 108 (90 Base) MCG/ACT inhaler INHALE 1 OR 2 PUFFS BY MOUTH EVERY 6 HOURS AS NEEDED FOR WHEEZING OR SHORTNESS OF BREATH  . [DISCONTINUED] Guaifenesin (MUCINEX MAXIMUM STRENGTH) 1200 MG TB12 Take 600 mg by mouth daily.   . [DISCONTINUED] sertraline (ZOLOFT) 25 MG tablet Take 1 tablet (25 mg total) by mouth daily.   No facility-administered encounter medications on file as of 09/14/2018.    Physical Exam: Blood pressure 122/70, pulse 81, height 5\' 7"  (1.702 m), weight 259 lb 3.2 oz (117.6 kg), SpO2 91 %. Gen:      No acute distress HEENT:  EOMI, sclera anicteric Neck:     No masses; no thyromegaly Lungs:    Clear to auscultation bilaterally; normal respiratory effort CV:         Regular rate and rhythm; no murmurs Abd:      + bowel sounds; soft, non-tender; no palpable masses, no distension Ext:    No edema; adequate peripheral perfusion Skin:      Warm and dry; no rash Neuro: alert and oriented x 3 Psych: normal mood and affect  Data Reviewed: Imaging Chest x-ray 12/24/2016-mild perihilar infiltrate suggestive of pulmonary edema I have reviewed the images personally.  PFTs 01/16/16 FVC 2.43 [58%], FEV1 1.49 [45%), F/F 61, TLC 81%,DLCO 56%  Severe obstruction, moderate reduction in diffusion capacity.  Sleep PSG 10/22/15  evere OSA, AHI 151. Started on an AutoSet CPAP titration study  Initiate CPAP at 12 with 2 L oxygen, fullface mask.  CPAP compliance report 06/23/16- 07/22/16 3% usage greater than 4 hours.  Labs CBC 08/24/17-absolute eosinophil count 254  Cardiac Echo (09/18/15) The right ventricular systolic pressure was increased consistent with moderate pulmonary hypertension.moderate concentric LVH. LVEF 60-65 percent. Grade 2 diastolic dysfunction. RV cavity size severe grade dilated. RV systolic function moderately to severely reduced. PA pressure  56  Assessment:  Severe COPD Symptoms are stable on anoro, Daliresp. Continue supplemental oxygen Increased work on weight loss with diet and exercise.  Severe OSA Stable on CPAP.  Continue with current setting of 12.  Tobacco use Quit again.  Hopefully he will not relapse.   Pulmonary hypertension Likely secondary to COPD, OSA.  We will continue to monitor.  Health maintenance 09/15/2017-influenza 09/15/2017- Prevnar 13  Plan/Recommendations: - Continue Anoro, Daliresp, supplemental oxygen - Continue CPAP  Marshell Garfinkel MD [facility] 09/14/2018, 12:03 PM  CC: No ref. provider found

## 2018-09-14 NOTE — Patient Instructions (Signed)
Continue the Anoro, Daliresp Work on [employer] loss with diet and exercise Congratulations on quitting smoking. Continue the CPAP  We will get a chest x-ray today Continue Flonase.  For postnasal drip you can use chlorpheniramine 4 mg 3 times daily.  This is available over-the-counter  Follow-up in 6 months with nurse practitioner.

## 2018-09-20 DIAGNOSIS — J449 Chronic obstructive pulmonary disease, unspecified: Secondary | ICD-10-CM | POA: Diagnosis not present

## 2018-09-22 DIAGNOSIS — J449 Chronic obstructive pulmonary disease, unspecified: Secondary | ICD-10-CM | POA: Diagnosis not present

## 2018-09-23 ENCOUNTER — Emergency Department (HOSPITAL_COMMUNITY): Payer: Medicare Other

## 2018-09-23 ENCOUNTER — Other Ambulatory Visit: Payer: Self-pay

## 2018-09-23 ENCOUNTER — Encounter (HOSPITAL_COMMUNITY): Payer: Self-pay

## 2018-09-23 ENCOUNTER — Observation Stay (HOSPITAL_COMMUNITY)
Admission: EM | Admit: 2018-09-23 | Discharge: 2018-09-24 | Disposition: A | Discharge status: Home / Self Care | Payer: Medicare Other | Attending: Internal Medicine | Admitting: Internal Medicine

## 2018-09-23 DIAGNOSIS — Z87891 Personal history of nicotine dependence: Secondary | ICD-10-CM | POA: Insufficient documentation

## 2018-09-23 DIAGNOSIS — I10 Essential (primary) hypertension: Secondary | ICD-10-CM | POA: Diagnosis not present

## 2018-09-23 DIAGNOSIS — I5032 Chronic diastolic (congestive) heart failure: Secondary | ICD-10-CM | POA: Insufficient documentation

## 2018-09-23 DIAGNOSIS — I110 Hypertensive heart disease with heart failure: Secondary | ICD-10-CM | POA: Insufficient documentation

## 2018-09-23 DIAGNOSIS — R0789 Other chest pain: Principal | ICD-10-CM | POA: Insufficient documentation

## 2018-09-23 DIAGNOSIS — R0602 Shortness of breath: Secondary | ICD-10-CM | POA: Diagnosis not present

## 2018-09-23 DIAGNOSIS — Z79899 Other long term (current) drug therapy: Secondary | ICD-10-CM | POA: Insufficient documentation

## 2018-09-23 DIAGNOSIS — Z720 Tobacco use: Secondary | ICD-10-CM

## 2018-09-23 DIAGNOSIS — Z7982 Long term (current) use of aspirin: Secondary | ICD-10-CM | POA: Insufficient documentation

## 2018-09-23 DIAGNOSIS — E785 Hyperlipidemia, unspecified: Secondary | ICD-10-CM | POA: Diagnosis not present

## 2018-09-23 DIAGNOSIS — J449 Chronic obstructive pulmonary disease, unspecified: Secondary | ICD-10-CM | POA: Diagnosis not present

## 2018-09-23 DIAGNOSIS — R17 Unspecified jaundice: Secondary | ICD-10-CM | POA: Insufficient documentation

## 2018-09-23 DIAGNOSIS — R079 Chest pain, unspecified: Secondary | ICD-10-CM

## 2018-09-23 DIAGNOSIS — F17210 Nicotine dependence, cigarettes, uncomplicated: Secondary | ICD-10-CM

## 2018-09-23 DIAGNOSIS — G4733 Obstructive sleep apnea (adult) (pediatric): Secondary | ICD-10-CM

## 2018-09-23 DIAGNOSIS — R072 Precordial pain: Secondary | ICD-10-CM

## 2018-09-23 DIAGNOSIS — E782 Mixed hyperlipidemia: Secondary | ICD-10-CM

## 2018-09-23 DIAGNOSIS — Z9989 Dependence on other enabling machines and devices: Secondary | ICD-10-CM

## 2018-09-23 LAB — CBC WITH DIFFERENTIAL/PLATELET
ABS IMMATURE GRANULOCYTES: 0.03 10*3/uL (ref 0.00–0.07)
Basophils Absolute: 0 10*3/uL (ref 0.0–0.1)
Basophils Relative: 0 %
EOS PCT: 1 %
Eosinophils Absolute: 0.1 10*3/uL (ref 0.0–0.5)
HCT: 52.7 % — ABNORMAL HIGH (ref 39.0–52.0)
HEMOGLOBIN: 16.8 g/dL (ref 13.0–17.0)
Immature Granulocytes: 0 %
Lymphocytes Relative: 26 %
Lymphs Abs: 2.7 10*3/uL (ref 0.7–4.0)
MCH: 30.5 pg (ref 26.0–34.0)
MCHC: 31.9 g/dL (ref 30.0–36.0)
MCV: 95.6 fL (ref 80.0–100.0)
MONO ABS: 1 10*3/uL (ref 0.1–1.0)
MONOS PCT: 10 %
Neutro Abs: 6.5 10*3/uL (ref 1.7–7.7)
Neutrophils Relative %: 63 %
Platelets: 196 10*3/uL (ref 150–400)
RBC: 5.51 MIL/uL (ref 4.22–5.81)
RDW: 13.3 % (ref 11.5–15.5)
WBC: 10.4 10*3/uL (ref 4.0–10.5)
nRBC: 0 % (ref 0.0–0.2)

## 2018-09-23 LAB — COMPREHENSIVE METABOLIC PANEL
ALK PHOS: 110 U/L (ref 38–126)
ALT: 23 U/L (ref 0–44)
ANION GAP: 7 (ref 5–15)
AST: 19 U/L (ref 15–41)
Albumin: 4.3 g/dL (ref 3.5–5.0)
BUN: 11 mg/dL (ref 6–20)
CALCIUM: 9 mg/dL (ref 8.9–10.3)
CO2: 30 mmol/L (ref 22–32)
Chloride: 99 mmol/L (ref 98–111)
Creatinine, Ser: 0.75 mg/dL (ref 0.61–1.24)
GFR calc non Af Amer: >60 mL/min (ref >60)
Glucose, Bld: 100 mg/dL — ABNORMAL HIGH (ref 70–99)
Potassium: 4.3 mmol/L (ref 3.5–5.1)
SODIUM: 136 mmol/L (ref 135–145)
TOTAL PROTEIN: 8.2 g/dL — AB (ref 6.5–8.1)
Total Bilirubin: 1.7 mg/dL — ABNORMAL HIGH (ref 0.3–1.2)

## 2018-09-23 LAB — HEMOGLOBIN A1C
Hgb A1c MFr Bld: 5.5 % (ref 4.8–5.6)
Mean Plasma Glucose: 111.15 mg/dL

## 2018-09-23 LAB — BILIRUBIN, FRACTIONATED(TOT/DIR/INDIR)
Bilirubin, Direct: 0.3 mg/dL — ABNORMAL HIGH (ref 0.0–0.2)
Indirect Bilirubin: 1.3 mg/dL — ABNORMAL HIGH (ref 0.3–0.9)
Total Bilirubin: 1.6 mg/dL — ABNORMAL HIGH (ref 0.3–1.2)

## 2018-09-23 LAB — TROPONIN I: Troponin I: <0.03 ng/mL (ref <0.03)

## 2018-09-23 MED ORDER — AMLODIPINE BESYLATE 5 MG PO TABS
5.0000 mg | ORAL_TABLET | Freq: Every day | ORAL | Status: DC
  Administered 2018-09-24: 5 mg via ORAL
  Filled 2018-09-23 (×2): qty 1

## 2018-09-23 MED ORDER — ASPIRIN EC 325 MG PO TBEC
325.0000 mg | DELAYED_RELEASE_TABLET | Freq: Every day | ORAL | Status: DC
  Administered 2018-09-24: 325 mg via ORAL
  Filled 2018-09-23: qty 1

## 2018-09-23 MED ORDER — ROFLUMILAST 500 MCG PO TABS
500.0000 ug | ORAL_TABLET | Freq: Every day | ORAL | Status: DC
  Administered 2018-09-24: 500 ug via ORAL
  Filled 2018-09-23: qty 1

## 2018-09-23 MED ORDER — NITROGLYCERIN 0.4 MG SL SUBL
0.4000 mg | SUBLINGUAL_TABLET | SUBLINGUAL | Status: DC | PRN
  Administered 2018-09-23: 0.4 mg via SUBLINGUAL
  Filled 2018-09-23: qty 1

## 2018-09-23 MED ORDER — ASPIRIN 325 MG PO TABS
325.0000 mg | ORAL_TABLET | Freq: Once | ORAL | Status: AC
  Administered 2018-09-23: 325 mg via ORAL
  Filled 2018-09-23: qty 1

## 2018-09-23 MED ORDER — FLUTICASONE PROPIONATE 50 MCG/ACT NA SUSP
2.0000 | Freq: Two times a day (BID) | NASAL | Status: DC
  Administered 2018-09-23 – 2018-09-24 (×2): 2 via NASAL
  Filled 2018-09-23 (×2): qty 16

## 2018-09-23 MED ORDER — UMECLIDINIUM-VILANTEROL 62.5-25 MCG/INH IN AEPB
1.0000 | INHALATION_SPRAY | Freq: Every day | RESPIRATORY_TRACT | Status: DC
  Administered 2018-09-24: 1 via RESPIRATORY_TRACT
  Filled 2018-09-23: qty 14

## 2018-09-23 MED ORDER — ENOXAPARIN SODIUM 40 MG/0.4ML ~~LOC~~ SOLN: 40.0000 mg | SUBCUTANEOUS | Status: DC

## 2018-09-23 MED ORDER — AMLODIPINE BESYLATE 5 MG PO TABS
5.0000 mg | ORAL_TABLET | Freq: Every day | ORAL | Status: DC
  Administered 2018-09-23: 5 mg via ORAL

## 2018-09-23 MED ORDER — ACETAMINOPHEN 650 MG RE SUPP: 650.0000 mg | Freq: Four times a day (QID) | RECTAL | Status: DC | PRN

## 2018-09-23 MED ORDER — ENOXAPARIN SODIUM 60 MG/0.6ML ~~LOC~~ SOLN
60.0000 mg | SUBCUTANEOUS | Status: DC
  Filled 2018-09-23: qty 0.6

## 2018-09-23 MED ORDER — ACETAMINOPHEN 325 MG PO TABS: 650.0000 mg | ORAL_TABLET | Freq: Four times a day (QID) | ORAL | Status: DC | PRN

## 2018-09-23 MED ORDER — ATORVASTATIN CALCIUM 40 MG PO TABS
40.0000 mg | ORAL_TABLET | Freq: Every morning | ORAL | Status: DC
  Administered 2018-09-24: 40 mg via ORAL
  Filled 2018-09-23: qty 1

## 2018-09-23 MED ORDER — ONDANSETRON HCL 4 MG/2ML IJ SOLN: 4.0000 mg | Freq: Four times a day (QID) | INTRAMUSCULAR | Status: DC | PRN

## 2018-09-23 MED ORDER — NITROGLYCERIN 2 % TD OINT
1.0000 [in_us] | TOPICAL_OINTMENT | Freq: Once | TRANSDERMAL | Status: AC
  Administered 2018-09-23: 1 [in_us] via TOPICAL
  Filled 2018-09-23: qty 1

## 2018-09-23 MED ORDER — IPRATROPIUM-ALBUTEROL 0.5-2.5 (3) MG/3ML IN SOLN: 3.0000 mL | RESPIRATORY_TRACT | Status: DC | PRN

## 2018-09-23 MED ORDER — ONDANSETRON HCL 4 MG PO TABS: 4.0000 mg | ORAL_TABLET | Freq: Four times a day (QID) | ORAL | Status: DC | PRN

## 2018-09-23 NOTE — ED Notes (Signed)
Attempted to give report to floor nurse. Nurse unavailable.

## 2018-09-23 NOTE — Progress Notes (Signed)
Patient discussed with ER staff, presents with chest pain of mixed characterisitcs. Initial trop negative, EKG nonacute. Please cycle enzymes and EKGs overnight, once initial workup is complete we will consider possible ischemic testing, please make npo at this time. No additional recs at this time until initial workup is complete   Zandra Abts MD

## 2018-09-23 NOTE — ED Provider Notes (Signed)
[facility] EMERGENCY DEPARTMENT Provider Note   CSN: [id number] Arrival date & time: 09/23/18  1057     History   Chief Complaint Chief Complaint  Patient presents with  . Chest Pain    HPI Ricardo Burch is a 57 y.o. male.  Patient states yesterday he had chest pressure with perspiring significantly for over half hour.  He also complains of some chest discomfort today  The history is provided by the patient. No language interpreter was used.  Chest Pain   This is a new problem. The current episode started 2 days ago. The problem occurs rarely. The problem has been gradually improving. The pain is associated with exertion. The pain is present in the substernal region. The pain is at a severity of 6/10. The pain is moderate. The quality of the pain is described as heavy. The pain does not radiate. The symptoms are aggravated by exertion. Pertinent negatives include no abdominal pain, no back pain, no cough and no headaches.  Pertinent negatives for past medical history include no seizures.    Past Medical History:  Diagnosis Date  . Allergy   . CHF (congestive heart failure) ([place])   . CHF (congestive heart failure) ([place])   . Chronic kidney disease   . COPD (chronic obstructive pulmonary disease) ([place]) Dx 2015  . Depression   . Eczema    since childhood   . Hyperlipidemia 01/12/2018  . Hypertension   . Oxygen deficiency   . Sleep apnea    CPAP  . Substance abuse ([place])    MJ, cocaine    Patient Active Problem List   Diagnosis Date Noted  . Chest pain 09/23/2018  . Premature ejaculation 01/12/2018  . Hyperlipidemia 01/12/2018  . Hypertension 10/14/2017  . Aortic atherosclerosis ([place]) 09/15/2017  . Pre-diabetes 09/15/2017  . Cocaine abuse ([place]) 07/26/2016  . Depression 07/26/2016  . OSA on CPAP 07/26/2016  . CHF (congestive heart failure) ([place]) 06/27/2016  . Alcohol abuse 06/27/2016  . COPD (chronic obstructive pulmonary disease) ([place]) 08/23/2014  . Eczema  08/23/2014  . Right knee pain 08/23/2014  . Tobacco abuse 03/27/2014    Past Surgical History:  Procedure Laterality Date  . COLONOSCOPY WITH PROPOFOL N/A 06/25/2017   Procedure: COLONOSCOPY WITH PROPOFOL;  Surgeon: Daneil Dolin, MD;  [facility];  Service: Endoscopy;  Laterality: N/A;  2:00pm  . MULTIPLE EXTRACTIONS WITH ALVEOLOPLASTY N/A 03/22/2016   Procedure: MULTIPLE EXTRACTION WITH ALVEOLOPLASTY;  Surgeon: Diona Browner, DDS;  Location: [place];  Service: Oral Surgery;  Laterality: N/A;  . NO PAST SURGERIES    . POLYPECTOMY  06/25/2017   Procedure: POLYPECTOMY;  Surgeon: Daneil Dolin, MD;  [facility];  Service: Endoscopy;;  colon        Home Medications    Prior to Admission medications   Medication Sig Start Date End Date Taking? Authorizing Provider  amLODipine (NORVASC) 5 MG tablet Take 1 tablet (5 mg total) by mouth daily. 05/12/18   Dorie Rank, MD  ANORO ELLIPTA 62.5-25 MCG/INH AEPB INHALE 1 PUFF INTO THE LUNGS DAILY. 05/28/18   Marshell Garfinkel, MD  aspirin EC 81 MG tablet Take 1 tablet (81 mg total) by mouth daily. 09/20/15   Florencia Reasons, MD  atorvastatin (LIPITOR) 40 MG tablet Take 40 mg by mouth every morning.  04/21/18   [provider]  diphenhydrAMINE (BENADRYL) 25 MG tablet Take 1 tablet (25 mg total) by mouth every 6 (six) hours. As needed for itching 04/07/18  Triplett, Tammy, PA-C  fluticasone (FLONASE) 50 MCG/ACT nasal spray Place 2 sprays into both nostrils 2 (two) times daily. 08/18/18   Mannam, Hart Robinsons, MD  OXYGEN Inhale 2 L into the lungs at bedtime.     [provider]  roflumilast (DALIRESP) 500 MCG TABS tablet Take 1 tablet by mouth daily 08/11/18   Mannam, Praveen, MD  triamcinolone cream (KENALOG) 0.1 % Apply 1 application topically 3 (three) times daily. Apply to affected areas 04/07/18   Triplett, Tammy, PA-C  varenicline (CHANTIX STARTING MONTH PAK) 0.5 MG X 11 & 1 MG X 42 tablet Take 1 0.5 mg tablet daily for 3 days,  then increase to 1 0.5 mg tablet twice daily for 4 days, then increase to 1 1 mg tablet twice daily. 03/18/18   Mannam, Praveen, MD  VENTOLIN HFA 108 (90 Base) MCG/ACT inhaler INHALE 1 OR 2 PUFFS BY MOUTH EVERY 6 HOURS AS NEEDED FOR WHEEZING OR SHORTNESS OF BREATH 09/03/18   Marshell Garfinkel, MD    Family History Family History  Problem Relation Age of Onset  . Arthritis Mother   . Hypertension Mother   . Stroke Mother        age 24  . Cerebral aneurysm Father   . Alcohol abuse Father   . Cancer Brother   . Diabetes Neg Hx   . Heart disease Neg Hx     Social History Social History   Tobacco Use  . Smoking status: Former Smoker    Packs/day: 0.25    Years: 38.00    Pack years: 9.50    Types: Cigarettes    Last attempt to quit: 08/24/2018    Years since quitting: 0.0  . Smokeless tobacco: Never Used  Substance Use Topics  . Alcohol use: Yes    Alcohol/week: 1.0 standard drinks    Types: 1 Cans of beer per week    Comment: occassionally: weekends, 6-pack or less on a day; or half pint of liquior  . Drug use: Yes    Types: Marijuana, Cocaine    Comment: not current     Allergies   Apresoline [hydralazine]   Review of Systems Review of Systems  Constitutional: Negative for appetite change and fatigue.  HENT: Negative for congestion, ear discharge and sinus pressure.   Eyes: Negative for discharge.  Respiratory: Negative for cough.   Cardiovascular: Positive for chest pain.  Gastrointestinal: Negative for abdominal pain and diarrhea.  Genitourinary: Negative for frequency and hematuria.  Musculoskeletal: Negative for back pain.  Skin: Negative for rash.  Neurological: Negative for seizures and headaches.  Psychiatric/Behavioral: Negative for hallucinations.     Physical Exam Updated Vital Signs BP 130/68   Pulse 78   Temp 98.6 F (37 C)   Resp 18   SpO2 92%   Physical Exam  Constitutional: He is oriented to person, place, and time. He appears  well-developed.  HENT:  Head: Normocephalic.  Eyes: Conjunctivae and EOM are normal. No scleral icterus.  Neck: Neck supple. No thyromegaly present.  Cardiovascular: Normal rate and regular rhythm. Exam reveals no gallop and no friction rub.  No murmur heard. Pulmonary/Chest: No stridor. He has no wheezes. He has no rales. He exhibits no tenderness.  Abdominal: He exhibits no distension. There is no tenderness. There is no rebound.  Musculoskeletal: Normal range of motion. He exhibits no edema.  Lymphadenopathy:    He has no cervical adenopathy.  Neurological: He is oriented to person, place, and time. He exhibits normal muscle tone.  Coordination normal.  Skin: No rash noted. No erythema.  Psychiatric: He has a normal mood and affect. His behavior is normal.     ED Treatments / Results  Labs (all labs ordered are listed, but only abnormal results are displayed) Labs Reviewed  CBC WITH DIFFERENTIAL/PLATELET - Abnormal; Notable for the following components:      Result Value   HCT 52.7 (*)    All other components within normal limits  COMPREHENSIVE METABOLIC PANEL - Abnormal; Notable for the following components:   Glucose, Bld 100 (*)    Total Protein 8.2 (*)    Total Bilirubin 1.7 (*)    All other components within normal limits  TROPONIN I    EKG None  Radiology Dg Chest 2 View  Result Date: 09/23/2018 CLINICAL DATA:  Chest pain and shortness of breath after eating yesterday. EXAM: CHEST - 2 VIEW COMPARISON:  Multiple previous chest x-rays and a remote chest CT from 2014. FINDINGS: The heart is normal in size and stable. Persistent prominence in the AP window due to a fairly enlarged pulmonary artery demonstrated on the chest CT from 2014 where it measured 4.7 cm. The lungs are clear of an acute process. No pulmonary lesions or pleural effusion. No pulmonary edema. Remote healed left rib fractures are noted. IMPRESSION: No acute cardiopulmonary findings. Stable prominence of  the AP window due to an enlarged main pulmonary artery. Electronically Signed   By: Marijo Sanes M.D.   On: 09/23/2018 12:45    Procedures Procedures (including critical care time)  Medications Ordered in ED Medications  nitroGLYCERIN (NITROSTAT) SL tablet 0.4 mg (0.4 mg Sublingual Given 09/23/18 1128)  aspirin tablet 325 mg (325 mg Oral Given 09/23/18 1328)  nitroGLYCERIN (NITROGLYN) 2 % ointment 1 inch (1 inch Topical Given 09/23/18 1328)     Initial Impression / Assessment and Plan / ED Course  I have reviewed the triage vital signs and the nursing notes.  Pertinent labs & imaging results that were available during my care of the patient were reviewed by me and considered in my medical decision making (see chart for details).     She with significant chest pressure.  Patient has risk factors of hypertension cholesterol and history of heart failure.  He will be admitted for rule out and cardiology will consult  Final Clinical Impressions(s) / ED Diagnoses   Final diagnoses:  Nonspecific chest pain    ED Discharge Orders    None       Milton Ferguson, MD 09/23/18 1346

## 2018-09-23 NOTE — ED Notes (Signed)
Gave pt crackers, and Ginger Ale to hold patient over until he gets a tray upstairs. Verified with Dr Tat over the phone that patient could eat.

## 2018-09-23 NOTE — ED Triage Notes (Signed)
Pt is having central chest pain that started yesterday at 1500. Stated to have eaten pork and a few mins after that he broke out into a sweat, got lightheaded, and felt his HR speed up. Pain does not radiate anywhere

## 2018-09-23 NOTE — Plan of Care (Signed)

## 2018-09-23 NOTE — H&P (Signed)
History and Physical  Ricardo Burch KGU:[id number] DOB: 1961-09-02 DOA: 09/23/2018   PCP: Doree Albee, MD   Patient coming from: Home  Chief Complaint: chest pain  HPI:  Ricardo Burch is a 57 y.o. male with medical history of COPD, diastolic CHF, tobacco abuse, pulmonary hypertension, obstructive sleep apnea presenting with 1 day history of chest discomfort.  The patient stated that around 3 PM on 09/22/2018, the patient developed substernal chest discomfort while unscrewing a light fixture.  This was associated with diaphoresis.  He stated the episode lasted approximately 10 to 15 minutes.  Later in the day, the patient experienced chest discomfort again with associated diaphoresis lasting 15 minutes.  He checked his heart rate which was 101 at that time and his oxygen saturation was 93-94% on room air.  Throughout the evening, the patient felt better.  However in the morning of 09/23/2018, the patient began experiencing some substernal chest discomfort radiating to his left arm.  He did not have any nausea, shortness of breath, or diaphoresis at this time.  However, the patient presented to the hospital for further evaluation.  He continues to smoke 1 to 2 cigarettes/day.  He denies any fevers, chills, cough, hemoptysis, vomiting, diarrhea, abdominal pain.  There is no dysuria hematuria.  There is no dizziness or syncope.  The patient endorses compliance with all his medications. In the emergency department, the patient was afebrile hemodynamically stable saturating 94% on room air.  BMP and LFTs were unremarkable.  CBC showed WBC 10.4.  Initial troponin was negative.  EKG shows sinus rhythm with right bundle branch block.  Chest x-ray was negative for pulmonary edema or infiltrates.  Assessment/Plan: Chest pain -Patient has typical and atypical components -As the patient has cardiac risk factors with anginal type symptoms, cardiology consulted -Continue aspirin -Echocardiogram -Cycle  troponins  COPD -Patient is on 2 L at nighttime with CPAP -Stable on room air presently -Continue duo nebs as needed shortness of breath -Continue Daliresp  Essential hypertension -Continue amlodipine  Tobacco abuse -I have discussed tobacco cessation with the patient.  I have counseled the patient regarding the negative impacts of continued tobacco use including but not limited to lung cancer, COPD, and cardiovascular disease.  I have discussed alternatives to tobacco and modalities that may help facilitate tobacco cessation including but not limited to biofeedback, hypnosis, and medications.  Total time spent with tobacco counseling was 4 minutes.  Hyperlipidemia -Continue statin  Hyperbilirubinemia -Fractionate bilirubin  Chronic diastolic CHF -Appears clinically euvolemic -06/28/2016 Echo EF 55 to 60%, no WMA, grade 1 DD    Past Medical History:  Diagnosis Date  . Allergy   . CHF (congestive heart failure) ([place])   . CHF (congestive heart failure) ([place])   . Chronic kidney disease   . COPD (chronic obstructive pulmonary disease) ([place]) Dx 2015  . Depression   . Eczema    since childhood   . Hyperlipidemia 01/12/2018  . Hypertension   . Oxygen deficiency   . Sleep apnea    CPAP  . Substance abuse ([place])    MJ, cocaine   Past Surgical History:  Procedure Laterality Date  . COLONOSCOPY WITH PROPOFOL N/A 06/25/2017   Procedure: COLONOSCOPY WITH PROPOFOL;  Surgeon: Daneil Dolin, MD;  [facility];  Service: Endoscopy;  Laterality: N/A;  2:00pm  . MULTIPLE EXTRACTIONS WITH ALVEOLOPLASTY N/A 03/22/2016   Procedure: MULTIPLE EXTRACTION WITH ALVEOLOPLASTY;  Surgeon: Diona Browner, DDS;  Location: [place];  Service: Oral Surgery;  Laterality: N/A;  . NO PAST SURGERIES    . POLYPECTOMY  06/25/2017   Procedure: POLYPECTOMY;  Surgeon: Daneil Dolin, MD;  [facility];  Service: Endoscopy;;  colon   Social History:  reports that he quit smoking about 4 weeks  ago. His smoking use included cigarettes. He has a 9.50 pack-year smoking history. He has never used smokeless tobacco. He reports that he drinks about 1.0 standard drinks of alcohol per week. He reports that he has current or past drug history. Drugs: Marijuana and Cocaine.   Family History  Problem Relation Age of Onset  . Arthritis Mother   . Hypertension Mother   . Stroke Mother        age 5  . Cerebral aneurysm Father   . Alcohol abuse Father   . Cancer Brother   . Diabetes Neg Hx   . Heart disease Neg Hx      Allergies  Allergen Reactions  . Apresoline [Hydralazine] Other (See Comments)    Headache      Prior to Admission medications   Medication Sig Start Date End Date Taking? Authorizing Provider  amLODipine (NORVASC) 5 MG tablet Take 1 tablet (5 mg total) by mouth daily. 05/12/18   Dorie Rank, MD  ANORO ELLIPTA 62.5-25 MCG/INH AEPB INHALE 1 PUFF INTO THE LUNGS DAILY. 05/28/18   Marshell Garfinkel, MD  aspirin EC 81 MG tablet Take 1 tablet (81 mg total) by mouth daily. 09/20/15   Florencia Reasons, MD  atorvastatin (LIPITOR) 40 MG tablet Take 40 mg by mouth every morning.  04/21/18   [provider]  diphenhydrAMINE (BENADRYL) 25 MG tablet Take 1 tablet (25 mg total) by mouth every 6 (six) hours. As needed for itching 04/07/18   Triplett, Tammy, PA-C  fluticasone (FLONASE) 50 MCG/ACT nasal spray Place 2 sprays into both nostrils 2 (two) times daily. 08/18/18   Mannam, Hart Robinsons, MD  OXYGEN Inhale 2 L into the lungs at bedtime.     [provider]  roflumilast (DALIRESP) 500 MCG TABS tablet Take 1 tablet by mouth daily 08/11/18   Mannam, Praveen, MD  triamcinolone cream (KENALOG) 0.1 % Apply 1 application topically 3 (three) times daily. Apply to affected areas 04/07/18   Triplett, Tammy, PA-C  varenicline (CHANTIX STARTING MONTH PAK) 0.5 MG X 11 & 1 MG X 42 tablet Take 1 0.5 mg tablet daily for 3 days, then increase to 1 0.5 mg tablet twice daily for 4 days, then increase to 1  1 mg tablet twice daily. 03/18/18   Mannam, Praveen, MD  VENTOLIN HFA 108 (90 Base) MCG/ACT inhaler INHALE 1 OR 2 PUFFS BY MOUTH EVERY 6 HOURS AS NEEDED FOR WHEEZING OR SHORTNESS OF BREATH 09/03/18   Mannam, Praveen, MD    Review of Systems:  Constitutional:  No weight loss, night sweats, Fevers, chills, fatigue.  Head&Eyes: No headache.  No vision loss.  No eye pain or scotoma ENT:  No Difficulty swallowing,Tooth/dental problems,Sore throat,  No ear ache, post nasal drip,  Cardio-vascular:  No Orthopnea, PND, swelling in lower extremities,  dizziness, palpitations  GI:  No  abdominal pain, nausea, vomiting, diarrhea, loss of appetite, hematochezia, melena, heartburn, indigestion, Resp:  No shortness of breath with exertion or at rest. No cough. No coughing up of blood .No wheezing.No chest wall deformity  Skin:  no rash or lesions.  GU:  no dysuria, change in color of urine, no urgency or frequency. No flank pain.  Musculoskeletal:  No joint pain or swelling. No decreased range of motion. No back pain.  Psych:  No change in mood or affect. No depression or anxiety. Neurologic: No headache, no dysesthesia, no focal weakness, no vision loss. No syncope  Physical Exam: Vitals:   09/23/18 1107 09/23/18 1130 09/23/18 1230  BP: 130/84 128/80 130/68  Pulse: 84 78   Resp: 17 16 18   Temp: 98.6 F (37 C)    SpO2: 90% 92%    General:  A&O x 3, NAD, nontoxic, pleasant/cooperative Head/Eye: No conjunctival hemorrhage, no icterus, [place]/AT, No nystagmus ENT:  No icterus,  No thrush, good dentition, no pharyngeal exudate Neck:  No masses, no lymphadenpathy, no bruits CV:  RRR, no rub, no gallop, no S3 Lung: diminished breath sounds but CTAB, good air movement, no wheeze, no rhonchi Abdomen: soft/NT, +BS, nondistended, no peritoneal signs Ext: No cyanosis, No rashes, No petechiae, No lymphangitis, No edema Neuro: CNII-XII intact, strength 4/5 in bilateral upper and lower extremities, no  dysmetria  Labs on Admission:  Basic Metabolic Panel: Recent Labs  Lab 09/23/18 1114  NA 136  K 4.3  CL 99  CO2 30  GLUCOSE 100*  BUN 11  CREATININE 0.75  CALCIUM 9.0   Liver Function Tests: Recent Labs  Lab 09/23/18 1114  AST 19  ALT 23  ALKPHOS 110  BILITOT 1.7*  PROT 8.2*  ALBUMIN 4.3   No results for input(s): LIPASE, AMYLASE in the last 168 hours. No results for input(s): AMMONIA in the last 168 hours. CBC: Recent Labs  Lab 09/23/18 1114  WBC 10.4  NEUTROABS 6.5  HGB 16.8  HCT 52.7*  MCV 95.6  PLT 196   Coagulation Profile: No results for input(s): INR, PROTIME in the last 168 hours. Cardiac Enzymes: Recent Labs  Lab 09/23/18 1114  TROPONINI <0.03   BNP: Invalid input(s): POCBNP CBG: No results for input(s): GLUCAP in the last 168 hours. Urine analysis:    Component Value Date/Time   COLORURINE YELLOW 10/14/2017 [place] 10/14/2017 1141   LABSPEC 1.010 10/14/2017 1141   PHURINE 8.0 10/14/2017 1141   GLUCOSEU NEGATIVE 10/14/2017 1141   HGBUR NEGATIVE 10/14/2017 1141   BILIRUBINUR NEGATIVE 08/24/2017 1630   KETONESUR NEGATIVE 10/14/2017 1141   PROTEINUR NEGATIVE 10/14/2017 1141   UROBILINOGEN 1.0 09/16/2015 1633   NITRITE NEGATIVE 10/14/2017 1141   LEUKOCYTESUR NEGATIVE 10/14/2017 1141   Sepsis Labs: @LABRCNTIP (procalcitonin:4,lacticidven:4) )No results found for this or any previous visit (from the past 240 hour(s)).   Radiological Exams on Admission: Dg Chest 2 View  Result Date: 09/23/2018 CLINICAL DATA:  Chest pain and shortness of breath after eating yesterday. EXAM: CHEST - 2 VIEW COMPARISON:  Multiple previous chest x-rays and a remote chest CT from 2014. FINDINGS: The heart is normal in size and stable. Persistent prominence in the AP window due to a fairly enlarged pulmonary artery demonstrated on the chest CT from 2014 where it measured 4.7 cm. The lungs are clear of an acute process. No pulmonary lesions or  pleural effusion. No pulmonary edema. Remote healed left rib fractures are noted. IMPRESSION: No acute cardiopulmonary findings. Stable prominence of the AP window due to an enlarged main pulmonary artery. Electronically Signed   By: Marijo Sanes M.D.   On: 09/23/2018 12:45    EKG: Independently reviewed. Sinus RBBB    Time spent:60 minutes Code Status:   FULL Family Communication:  No Family at bedside Disposition Plan: expect 1 day hospitalization Consults called:  cardiology DVT Prophylaxis: [place] Lovenox/Heparin   SCDs  Orson Eva, DO  [facility] Pager [phone]  If 7PM-7AM, please contact night-coverage [email or web address] Password [email or web address] 09/23/2018, 1:51 PM

## 2018-09-24 ENCOUNTER — Encounter (HOSPITAL_COMMUNITY): Payer: Self-pay | Admitting: Student

## 2018-09-24 ENCOUNTER — Observation Stay (HOSPITAL_BASED_OUTPATIENT_CLINIC_OR_DEPARTMENT_OTHER): Payer: Medicare Other

## 2018-09-24 DIAGNOSIS — R079 Chest pain, unspecified: Secondary | ICD-10-CM

## 2018-09-24 DIAGNOSIS — Z720 Tobacco use: Secondary | ICD-10-CM | POA: Diagnosis not present

## 2018-09-24 DIAGNOSIS — R0789 Other chest pain: Secondary | ICD-10-CM

## 2018-09-24 DIAGNOSIS — I5032 Chronic diastolic (congestive) heart failure: Secondary | ICD-10-CM | POA: Diagnosis not present

## 2018-09-24 DIAGNOSIS — R072 Precordial pain: Secondary | ICD-10-CM | POA: Diagnosis not present

## 2018-09-24 DIAGNOSIS — E782 Mixed hyperlipidemia: Secondary | ICD-10-CM | POA: Diagnosis not present

## 2018-09-24 LAB — LIPID PANEL
CHOL/HDL RATIO: 2.8 ratio
CHOLESTEROL: 91 mg/dL (ref 0–200)
HDL: 33 mg/dL — ABNORMAL LOW (ref >40)
LDL CALC: 49 mg/dL (ref 0–99)
TRIGLYCERIDES: 43 mg/dL (ref <150)
VLDL: 9 mg/dL (ref 0–40)

## 2018-09-24 LAB — RAPID URINE DRUG SCREEN, HOSP PERFORMED
Amphetamines: NOT DETECTED
Barbiturates: NOT DETECTED
Benzodiazepines: NOT DETECTED
Cocaine: NOT DETECTED
OPIATES: NOT DETECTED
Tetrahydrocannabinol: NOT DETECTED

## 2018-09-24 LAB — COMPREHENSIVE METABOLIC PANEL
ALT: 22 U/L (ref 0–44)
ANION GAP: 7 (ref 5–15)
AST: 19 U/L (ref 15–41)
Albumin: 4 g/dL (ref 3.5–5.0)
Alkaline Phosphatase: 101 U/L (ref 38–126)
BILIRUBIN TOTAL: 1.6 mg/dL — AB (ref 0.3–1.2)
BUN: 11 mg/dL (ref 6–20)
CHLORIDE: 100 mmol/L (ref 98–111)
CO2: 28 mmol/L (ref 22–32)
Calcium: 8.7 mg/dL — ABNORMAL LOW (ref 8.9–10.3)
Creatinine, Ser: 0.79 mg/dL (ref 0.61–1.24)
GFR calc Af Amer: >60 mL/min (ref >60)
Glucose, Bld: 91 mg/dL (ref 70–99)
Potassium: 4.4 mmol/L (ref 3.5–5.1)
Sodium: 135 mmol/L (ref 135–145)
TOTAL PROTEIN: 7.8 g/dL (ref 6.5–8.1)

## 2018-09-24 LAB — ECHOCARDIOGRAM COMPLETE
Height: 67 in
Weight: 4144.648 oz

## 2018-09-24 LAB — HIV ANTIBODY (ROUTINE TESTING W REFLEX): HIV SCREEN 4TH GENERATION: NONREACTIVE

## 2018-09-24 LAB — TROPONIN I: Troponin I: <0.03 ng/mL (ref <0.03)

## 2018-09-24 MED ORDER — PERFLUTREN LIPID MICROSPHERE
1.0000 mL | INTRAVENOUS | Status: AC | PRN
  Administered 2018-09-24: 1 mL via INTRAVENOUS
  Administered 2018-09-24: 2 mL via INTRAVENOUS

## 2018-09-24 NOTE — Care Management Obs Status (Signed)
[place] NOTIFICATION   Patient Details  Name: Ricardo Burch MRN: [id number] Date of Birth: August 02, 1961   Medicare Observation Status Notification Given:  Yes    Sherald Barge, RN 09/24/2018, 3:29 PM

## 2018-09-24 NOTE — Consult Note (Addendum)
Cardiology Consult    Patient ID: Ricardo Burch; [id number]; [birth year]   Admit date: 09/23/2018 Date of Consult: 09/24/2018  Primary Care Provider: Doree Albee, MD Primary Cardiologist: Kate Sable, MD   Patient Profile    Ricardo Burch is a 57 y.o. male with past medical history of chronic diastolic CHF, HTN, HLD, COPD, OSA, tobacco use, and history of cocaine use who is being seen today for the evaluation of chest pain at the request of Dr. Carles Collet.   History of Present Illness    Mr. Iannello was last examined by Dr. [name] in 11/2017 and reported having exertional dyspnea which was at baseline but denied any associated chest pain or palpitations. He was continued on his current medication regimen and was not placed on diuretic therapy as he appeared euvolemic by examination.  He presented to [facility] ED on 09/23/2018 for evaluation of chest discomfort. He reports having consumed pork 2 days prior to admission (initially stated on Sunday but confirmed symptoms were Tuesday) and says that he developed discomfort along his sternal region with this.  He reports having diaphoresis at that time and felt that his BP was elevated. After several hours of discomfort, he did consume vinegar which helped alleviate his symptoms. Notes mention that he had recurrent pain yesterday morning but the patient reports he overall felt well but wanted to get evaluated due to his symptoms the day prior. He has baseline dyspnea on exertion in the setting of COPD but reports his respiratory status is overall been stable. He does climb flights of stairs on a daily basis and denies any chest discomfort with this. No recent orthopnea, PND, or lower extremity edema. He reports compliant use with his CPAP and uses oxygen supplementation intermittently throughout the day as needed.  He was previously smoking 1.0 ppd but recently reduced this to less than 2 cigarettes/day. Does consume occasional alcohol on the  weekends and denies any recent recreational drug use. No known family history of CAD.   Initial labs show WBC 10.4, Hgb 16.8, platelets 196, Na+ 136, K+ 4.3, and creatinine 0.75.  AST 19 and ALT 23. Hgb A1c 5.5.  FLP shows total cholesterol 91, triglycerides 43, HDL 33, and LDL 49.  Initial and cyclic troponin values have been negative. CXR with no acute cardiopulmonary findings. EKG shows NSR, HR 77, with RBBB. No acute ST changes noted when compared to prior tracings.    Past Medical History:  Diagnosis Date  . Allergy   . CHF (congestive heart failure) ([place])   . Chronic kidney disease   . COPD (chronic obstructive pulmonary disease) ([place]) Dx 2015  . Depression   . Eczema    since childhood   . Hyperlipidemia 01/12/2018  . Hypertension   . Oxygen deficiency   . Sleep apnea    CPAP  . Substance abuse ([place])    MJ, cocaine    Past Surgical History:  Procedure Laterality Date  . COLONOSCOPY WITH PROPOFOL N/A 06/25/2017   Procedure: COLONOSCOPY WITH PROPOFOL;  Surgeon: Daneil Dolin, MD;  [facility];  Service: Endoscopy;  Laterality: N/A;  2:00pm  . MULTIPLE EXTRACTIONS WITH ALVEOLOPLASTY N/A 03/22/2016   Procedure: MULTIPLE EXTRACTION WITH ALVEOLOPLASTY;  Surgeon: Diona Browner, DDS;  Location: [place];  Service: Oral Surgery;  Laterality: N/A;  . NO PAST SURGERIES    . POLYPECTOMY  06/25/2017   Procedure: POLYPECTOMY;  Surgeon: Daneil Dolin, MD;  Location[facility];  Service: Endoscopy;;  colon     Home Medications:  Prior to Admission medications   Medication Sig Start Date End Date Taking? Authorizing Provider  amLODipine (NORVASC) 5 MG tablet Take 1 tablet (5 mg total) by mouth daily. 05/12/18  Yes Dorie Rank, MD  ANORO ELLIPTA 62.5-25 MCG/INH AEPB INHALE 1 PUFF INTO THE LUNGS DAILY. 05/28/18  Yes Mannam, Praveen, MD  aspirin EC 81 MG tablet Take 1 tablet (81 mg total) by mouth daily. 09/20/15  Yes Florencia Reasons, MD  atorvastatin (LIPITOR) 40 MG tablet Take 40 mg by  mouth every morning.  04/21/18  Yes [provider]  fluticasone (FLONASE) 50 MCG/ACT nasal spray Place 2 sprays into both nostrils 2 (two) times daily. 08/18/18  Yes Mannam, Praveen, MD  OXYGEN Inhale 2 L into the lungs at bedtime.    Yes [provider]  roflumilast (DALIRESP) 500 MCG TABS tablet Take 1 tablet by mouth daily 08/11/18  Yes Mannam, Praveen, MD  triamcinolone cream (KENALOG) 0.1 % Apply 1 application topically 3 (three) times daily. Apply to affected areas 04/07/18  Yes Triplett, Tammy, PA-C  VENTOLIN HFA 108 (90 Base) MCG/ACT inhaler INHALE 1 OR 2 PUFFS BY MOUTH EVERY 6 HOURS AS NEEDED FOR WHEEZING OR SHORTNESS OF BREATH 09/03/18  Yes Mannam, Praveen, MD    Inpatient Medications: Scheduled Meds: . amLODipine  5 mg Oral Daily  . aspirin EC  325 mg Oral Daily  . atorvastatin  40 mg Oral q morning - 10a  . enoxaparin (LOVENOX) injection  60 mg Subcutaneous Q24H  . fluticasone  2 spray Each Nare BID  . roflumilast  500 mcg Oral Daily  . umeclidinium-vilanterol  1 puff Inhalation Daily   Continuous Infusions:  PRN Meds: acetaminophen **OR** acetaminophen, ipratropium-albuterol, nitroGLYCERIN, ondansetron **OR** ondansetron (ZOFRAN) IV  Allergies:    Allergies  Allergen Reactions  . Apresoline [Hydralazine] Other (See Comments)    Headache     Social History:   Social History   Socioeconomic History  . Marital status: Single    Spouse name: Not on file  . Number of children: 5  . Years of education: 91  . Highest education level: Not on file  Occupational History  . Occupation: Unemployed     Comment: car wash details  Social Needs  . Financial resource strain: Not on file  . Food insecurity:    Worry: Not on file    Inability: Not on file  . Transportation needs:    Medical: Not on file    Non-medical: Not on file  Tobacco Use  . Smoking status: Former Smoker    Packs/day: 0.25    Years: 38.00    Pack years: 9.50    Types: Cigarettes     Last attempt to quit: 08/24/2018    Years since quitting: 0.0  . Smokeless tobacco: Never Used  Substance and Sexual Activity  . Alcohol use: Yes    Alcohol/week: 1.0 standard drinks    Types: 1 Cans of beer per week    Comment: occassionally: weekends, 6-pack or less on a day; or half pint of liquior  . Drug use: Yes    Types: Marijuana, Cocaine    Comment: not current  . Sexual activity: Never  Lifestyle  . Physical activity:    Days per week: Not on file    Minutes per session: Not on file  . Stress: Not on file  Relationships  . Social connections:    Talks on phone: Not on file  Gets together: Not on file    Attends religious service: Not on file    Active member of club or organization: Not on file    Attends meetings of clubs or organizations: Not on file    Relationship status: Not on file  . Intimate partner violence:    Fear of current or ex partner: Not on file    Emotionally abused: Not on file    Physically abused: Not on file    Forced sexual activity: Not on file  Other Topics Concern  . Not on file  Social History Narrative    Lives alone.    5 daughters 50, 70 yo twins, eldest 2 are married live in [place].    Works part time in a car wash     Family History:    Family History  Problem Relation Age of Onset  . Arthritis Mother   . Hypertension Mother   . Stroke Mother        age 76  . Cerebral aneurysm Father   . Alcohol abuse Father   . Cancer Brother   . Diabetes Neg Hx   . Heart disease Neg Hx       Review of Systems    General:  No chills, fever, night sweats or weight changes.  Cardiovascular:  No dyspnea on exertion, edema, orthopnea, palpitations, paroxysmal nocturnal dyspnea. Positive for chest pain.  Dermatological: No rash, lesions/masses Respiratory: No cough, dyspnea Urologic: No hematuria, dysuria Abdominal:   No nausea, vomiting, diarrhea, bright red blood per rectum, melena, or hematemesis Neurologic:  No visual  changes, wkns, changes in mental status. All other systems reviewed and are otherwise negative except as noted above.  Physical Exam/Data    Vitals:   09/23/18 2102 09/23/18 2205 09/23/18 2318 09/24/18 0521  BP:  126/77  99/70  Pulse:  74 72 79  Resp:  18 18 20   Temp:  98.3 F (36.8 C)  97.8 F (36.6 C)  TempSrc:  Oral  Oral  SpO2: 95% 94% 95% 94%  Weight:      Height:       No intake or output data in the 24 hours ending 09/24/18 0834 Filed Weights   09/23/18 1646  Weight: 117.5 kg   Body mass index is 40.57 kg/m.   General: Pleasant, African American male appearing in NAD Psych: Normal affect. Neuro: Alert and oriented X 3. Moves all extremities spontaneously. HEENT: Normal  Neck: Supple without bruits or JVD. Lungs:  Resp regular and unlabored, CTA without wheezing or rales. Heart: RRR no s3, s4, or murmurs. Abdomen: Soft, non-tender, non-distended, BS + x 4.  Extremities: No clubbing, cyanosis or edema. DP/PT/Radials 2+ and equal bilaterally.   EKG:  The EKG was personally reviewed and demonstrates: NSR, HR 77, with RBBB. No acute ST changes noted when compared to prior tracings.    Labs/Studies     Relevant CV Studies:  Echocardiogram: 06/2016 Study Conclusions  - Left ventricle: The cavity size was normal. Systolic function was   normal. The estimated ejection fraction was in the range of 55%   to 60%. Wall motion was normal; there were no regional wall   motion abnormalities. Doppler parameters are consistent with   abnormal left ventricular relaxation (grade 1 diastolic   dysfunction). - Ventricular septum: The contour showed diastolic flattening and   systolic flattening. These changes are consistent with RV volume   and pressure overload. - Mitral valve: Calcified annulus. - Right ventricle: The  cavity size was severely dilated. Wall   thickness was normal. Systolic function was mildly to moderately   reduced. - Right atrium: The atrium was  severely dilated. - Pulmonary arteries: Systolic pressure was moderately to severely   increased. PA peak pressure: 62 mm Hg (S). - Pericardium, extracardiac: A trivial pericardial effusion was   identified posterior to the heart.   Laboratory Data:  Chemistry Recent Labs  Lab 09/23/18 1114 09/24/18 0630  NA 136 135  K 4.3 4.4  CL 99 100  CO2 30 28  GLUCOSE 100* 91  BUN 11 11  CREATININE 0.75 0.79  CALCIUM 9.0 8.7*  GFRNONAA >60 >60  GFRAA >60 >60  ANIONGAP 7 7    Recent Labs  Lab 09/23/18 1114 09/23/18 1710 09/24/18 0630  PROT 8.2*  --  7.8  ALBUMIN 4.3  --  4.0  AST 19  --  19  ALT 23  --  22  ALKPHOS 110  --  101  BILITOT 1.7* 1.6* 1.6*   Hematology Recent Labs  Lab 09/23/18 1114  WBC 10.4  RBC 5.51  HGB 16.8  HCT 52.7*  MCV 95.6  MCH 30.5  MCHC 31.9  RDW 13.3  PLT 196   Cardiac Enzymes Recent Labs  Lab 09/23/18 1114 09/23/18 1710 09/24/18 0027 09/24/18 0630  TROPONINI <0.03 <0.03 <0.03 <0.03   No results for input(s): TROPIPOC in the last 168 hours.  BNPNo results for input(s): BNP, PROBNP in the last 168 hours.  DDimer No results for input(s): DDIMER in the last 168 hours.  Radiology/Studies:  Dg Chest 2 View  Result Date: 09/23/2018 CLINICAL DATA:  Chest pain and shortness of breath after eating yesterday. EXAM: CHEST - 2 VIEW COMPARISON:  Multiple previous chest x-rays and a remote chest CT from 2014. FINDINGS: The heart is normal in size and stable. Persistent prominence in the AP window due to a fairly enlarged pulmonary artery demonstrated on the chest CT from 2014 where it measured 4.7 cm. The lungs are clear of an acute process. No pulmonary lesions or pleural effusion. No pulmonary edema. Remote healed left rib fractures are noted. IMPRESSION: No acute cardiopulmonary findings. Stable prominence of the AP window due to an enlarged main pulmonary artery. Electronically Signed   By: Marijo Sanes M.D.   On: 09/23/2018 12:45      Assessment & Plan    1. Atypical Chest Pain - He developed chest discomfort 2 days prior to admission which occurred after consuming pork and lasted for several hours. He did consume vinegar later that evening and reports this helped with his symptoms. He denies any repeat episodes of chest pain since. No recent exertional symptoms.  Reports that his respiratory status has been at baseline. - cardiac risk factors include HTN, HLD, and tobacco use. No personal history of CAD or family history of cardiac diagnoses.  - Initial and cyclic troponin values have been negative. EKG shows NSR, HR 77, with RBBB. No acute ST changes noted when compared to prior tracings.  - overall, his symptoms seem atypical for a cardiac etiology. Echocardiogram is pending to assess LV function and wall motion. If no significant abnormalities by echo, would not anticipate further ischemic testing this admission. If he has recurrent symptoms in the outpatient setting, could consider stress testing at that time given his risk factors.   2. History of Diastolic CHF - He has baseline dyspnea on exertion in the setting of COPD but denies any recent orthopnea, PND, or  lower extremity edema. Appears euvolemic by examination today. Repeat echocardiogram is pending to assess LV function.  3. HTN - BP has been variable at 99/63 - 140/103 since admission. Continue Amlodipine 5mg  daily.   4. HLD - FLP shows total cholesterol 91, triglycerides 43, HDL 33, and LDL 49.  - continue PTA Atorvastatin 40mg  daily.   5. OSA - continued compliance with CPAP encouraged.   6. Tobacco Use - he was previously smoking 1.0 ppd but has reduced this to 1-2 cigarettes per day. Congratulated on his reduction with cessation advised.     For questions or updates, please contact [place] Please consult [email or web address] for contact info under Cardiology/STEMI.  Signed, Erma Heritage, PA-C 09/24/2018, 8:34 AM Pager:  [phone]   Attending note Patient seen and dicsussed with PA Ahmed Prima, I agree with her documentation above. 57 yo male history of COPD, diastolic HF, OSA, pulm HTN by echo admitted with chest pain.  The description of the symptoms is somewhat variable when looking over documentation. To me he reports symptoms starting about 30-45 min after eating pork tenderloin. Became diaphoretic, followed by aching pain midchest. Later had belching which improved symptoms, also symptoms better with drinking vinegar. Symptoms occurred on Tuesday, no recurrence since that time.    WBC 10.4 Hgb 16.8 Plt 196 K 4.3 Cr 0.75 UDS neg LDL 49 Trop neg x 4  CXR no acute process EKG SR, RBBB, RAD   Atypical chest pain without objective evidence of ischemia, if stable echo findings would not plan of any further testing at this time   Carlyle Dolly MD

## 2018-09-24 NOTE — Discharge Summary (Signed)
Physician Discharge Summary  Ricardo Burch CLE:[id number] DOB: 14-Mar-1961 DOA: 09/23/2018  PCP: Doree Albee, MD  Admit date: 09/23/2018 Discharge date: 09/24/2018  Admitted From: Home Disposition:  Home   Recommendations for Outpatient Follow-up:  1. Follow up with PCP in 1-2 weeks 2. Please obtain BMP/CBC in one week    Discharge Condition: Stable CODE STATUS: FULL Diet recommendation: Heart Healthy   Brief/Interim Summary:  57 y.o. male with medical history of COPD, diastolic CHF, tobacco abuse, pulmonary hypertension, obstructive sleep apnea presenting with 1 day history of chest discomfort.  The patient stated that around 3 PM on 09/22/2018, the patient developed substernal chest discomfort while unscrewing a light fixture.  This was associated with diaphoresis.  He stated the episode lasted approximately 10 to 15 minutes.  Later in the day, the patient experienced chest discomfort again with associated diaphoresis lasting 15 minutes.  He checked his heart rate which was 101 at that time and his oxygen saturation was 93-94% on room air. He states he drank some vinegar which also made his chest discomfort better.  Belching also resulted in some improvement. Throughout the evening, the patient felt better.  However in the morning of 09/23/2018, the patient began experiencing some substernal chest discomfort radiating to his left arm.  He did not have any nausea, shortness of breath, or diaphoresis at this time.  However, the patient presented to the hospital for further evaluation.  He continues to smoke 1 to 2 cigarettes/day.  He denies any fevers, chills, cough, hemoptysis, vomiting, diarrhea, abdominal pain.  There is no dysuria hematuria.  There is no dizziness or syncope.  The patient endorses compliance with all his medications.   In the emergency department, the patient was afebrile hemodynamically stable saturating 94% on room air.  BMP and LFTs were unremarkable.  CBC showed  WBC 10.4.  Initial troponin was negative.  EKG shows sinus rhythm with right bundle branch block.  Chest x-ray was negative for pulmonary edema or infiltrates.  Cardiology was consulted to assist with management  Discharge Diagnoses:   Chest pain -Patient has mostly atypical components -Continue aspirin -Echocardiogram--EF 60-65%, no WMA, moderate RV dilatation -Cycle troponins--neg x 3 -Appreciate cardiology consult--no further inpatient testing, patient cleared for discharge  COPD -Patient is on 2 L at nighttime with CPAP -Stable on room air presently -Continue duo nebs as needed shortness of breath -Continue Daliresp  Essential hypertension -Continue amlodipine  Tobacco abuse -I have discussed tobacco cessation with the patient.  I have counseled the patient regarding the negative impacts of continued tobacco use including but not limited to lung cancer, COPD, and cardiovascular disease.  I have discussed alternatives to tobacco and modalities that may help facilitate tobacco cessation including but not limited to biofeedback, hypnosis, and medications.  Total time spent with tobacco counseling was 4 minutes.  Hyperlipidemia -Continue statin  Hyperbilirubinemia -Fractionate bilirubin--mostly indirect -suspect Gilbert's  Chronic diastolic CHF -Appears clinically euvolemic -06/28/2016 Echo EF 55 to 60%, no WMA, grade 1 DD  Discharge Instructions   Allergies as of 09/24/2018      Reactions   Apresoline [hydralazine] Other (See Comments)   Headache       Medication List    TAKE these medications   amLODipine 5 MG tablet Commonly known as:  NORVASC Take 1 tablet (5 mg total) by mouth daily.   ANORO ELLIPTA 62.5-25 MCG/INH Aepb Generic drug:  umeclidinium-vilanterol INHALE 1 PUFF INTO THE LUNGS DAILY.   aspirin EC 81 MG tablet Take  1 tablet (81 mg total) by mouth daily.   atorvastatin 40 MG tablet Commonly known as:  LIPITOR Take 40 mg by mouth every  morning.   fluticasone 50 MCG/ACT nasal spray Commonly known as:  FLONASE Place 2 sprays into both nostrils 2 (two) times daily.   OXYGEN Inhale 2 L into the lungs at bedtime.   roflumilast 500 MCG Tabs tablet Commonly known as:  DALIRESP Take 1 tablet by mouth daily   triamcinolone cream 0.1 % Commonly known as:  KENALOG Apply 1 application topically 3 (three) times daily. Apply to affected areas   VENTOLIN HFA 108 (90 Base) MCG/ACT inhaler Generic drug:  albuterol INHALE 1 OR 2 PUFFS BY MOUTH EVERY 6 HOURS AS NEEDED FOR WHEEZING OR SHORTNESS OF BREATH       Allergies  Allergen Reactions  . Apresoline [Hydralazine] Other (See Comments)    Headache     Consultations:  cardiology   Procedures/Studies: Dg Chest 2 View  Result Date: 09/23/2018 CLINICAL DATA:  Chest pain and shortness of breath after eating yesterday. EXAM: CHEST - 2 VIEW COMPARISON:  Multiple previous chest x-rays and a remote chest CT from 2014. FINDINGS: The heart is normal in size and stable. Persistent prominence in the AP window due to a fairly enlarged pulmonary artery demonstrated on the chest CT from 2014 where it measured 4.7 cm. The lungs are clear of an acute process. No pulmonary lesions or pleural effusion. No pulmonary edema. Remote healed left rib fractures are noted. IMPRESSION: No acute cardiopulmonary findings. Stable prominence of the AP window due to an enlarged main pulmonary artery. Electronically Signed   By: Marijo Sanes M.D.   On: 09/23/2018 12:45   Dg Chest 2 View  Result Date: 09/14/2018 CLINICAL DATA:  Mixed type COPD. Dyspnea and chest burning for 6 months. EXAM: CHEST - 2 VIEW COMPARISON:  Chest x-rays dated 12/24/2016 and 11/04/2016 and 07/28/2016 and CT scan of the chest dated 10/09/2013 FINDINGS: Heart size is normal. There is chronic prominence of the main pulmonary arteries, unchanged since 2014. The lungs are clear. No effusions. Old healed left lateral rib fractures.  IMPRESSION: No acute abnormalities. Chronic prominence of the pulmonary arteries. Electronically Signed   By: Lorriane Shire M.D.   On: 09/14/2018 15:20        Discharge Exam: Vitals:   09/24/18 0849 09/24/18 0910  BP: 104/70   Pulse: 68   Resp:    Temp:    SpO2:  90%   Vitals:   09/23/18 2318 09/24/18 0521 09/24/18 0849 09/24/18 0910  BP:  99/70 104/70   Pulse: 72 79 68   Resp: 18 20    Temp:  97.8 F (36.6 C)    TempSrc:  Oral    SpO2: 95% 94%  90%  Weight:      Height:        General: Pt is alert, awake, not in acute distress Cardiovascular: RRR, S1/S2 +, no rubs, no gallops Respiratory: CTA bilaterally, no wheezing, no rhonchi Abdominal: Soft, NT, ND, bowel sounds + Extremities: no edema, no cyanosis   The results of significant diagnostics from this hospitalization (including imaging, microbiology, ancillary and laboratory) are listed below for reference.    Significant Diagnostic Studies: Dg Chest 2 View  Result Date: 09/23/2018 CLINICAL DATA:  Chest pain and shortness of breath after eating yesterday. EXAM: CHEST - 2 VIEW COMPARISON:  Multiple previous chest x-rays and a remote chest CT from 2014. FINDINGS: The heart is normal  in size and stable. Persistent prominence in the AP window due to a fairly enlarged pulmonary artery demonstrated on the chest CT from 2014 where it measured 4.7 cm. The lungs are clear of an acute process. No pulmonary lesions or pleural effusion. No pulmonary edema. Remote healed left rib fractures are noted. IMPRESSION: No acute cardiopulmonary findings. Stable prominence of the AP window due to an enlarged main pulmonary artery. Electronically Signed   By: Marijo Sanes M.D.   On: 09/23/2018 12:45   Dg Chest 2 View  Result Date: 09/14/2018 CLINICAL DATA:  Mixed type COPD. Dyspnea and chest burning for 6 months. EXAM: CHEST - 2 VIEW COMPARISON:  Chest x-rays dated 12/24/2016 and 11/04/2016 and 07/28/2016 and CT scan of the chest dated  10/09/2013 FINDINGS: Heart size is normal. There is chronic prominence of the main pulmonary arteries, unchanged since 2014. The lungs are clear. No effusions. Old healed left lateral rib fractures. IMPRESSION: No acute abnormalities. Chronic prominence of the pulmonary arteries. Electronically Signed   By: Lorriane Shire M.D.   On: 09/14/2018 15:20     Microbiology: No results found for this or any previous visit (from the past 240 hour(s)).   Labs: Basic Metabolic Panel: Recent Labs  Lab 09/23/18 1114 09/24/18 0630  NA 136 135  K 4.3 4.4  CL 99 100  CO2 30 28  GLUCOSE 100* 91  BUN 11 11  CREATININE 0.75 0.79  CALCIUM 9.0 8.7*   Liver Function Tests: Recent Labs  Lab 09/23/18 1114 09/23/18 1710 09/24/18 0630  AST 19  --  19  ALT 23  --  22  ALKPHOS 110  --  101  BILITOT 1.7* 1.6* 1.6*  PROT 8.2*  --  7.8  ALBUMIN 4.3  --  4.0   No results for input(s): LIPASE, AMYLASE in the last 168 hours. No results for input(s): AMMONIA in the last 168 hours. CBC: Recent Labs  Lab 09/23/18 1114  WBC 10.4  NEUTROABS 6.5  HGB 16.8  HCT 52.7*  MCV 95.6  PLT 196   Cardiac Enzymes: Recent Labs  Lab 09/23/18 1114 09/23/18 1710 09/24/18 0027 09/24/18 0630  TROPONINI <0.03 <0.03 <0.03 <0.03   BNP: Invalid input(s): POCBNP CBG: No results for input(s): GLUCAP in the last 168 hours.  Time coordinating discharge:  36 minutes  Signed:  Orson Eva, DO [facility] Pager: [phone] 09/24/2018, 10:23 AM

## 2018-09-24 NOTE — Progress Notes (Signed)
*  PRELIMINARY RESULTS* Echocardiogram 2D Echocardiogram has been performed with [facility].  Ricardo Burch 09/24/2018, 1:26 PM

## 2018-09-28 ENCOUNTER — Telehealth: Payer: Self-pay | Admitting: Pulmonary Disease

## 2018-09-28 ENCOUNTER — Other Ambulatory Visit: Payer: Self-pay | Admitting: Pulmonary Disease

## 2018-09-28 NOTE — Telephone Encounter (Signed)
Called and spoke with patient, he stated the he has a simply go mini with a strap that he uses. He stated that now that he can use both hands and since the [employer] will allow him to use both hands he would like to switch to that. Advised patient that he will need to contact [facility] to get things set up and then we can send in the prescription from there. Patient stated that he would call [facility] and then give [place] a call back. Nothing further needed at this time.

## 2018-10-07 ENCOUNTER — Telehealth: Payer: Self-pay | Admitting: Pulmonary Disease

## 2018-10-07 MED ORDER — VARENICLINE TARTRATE 0.5 MG X 11 & 1 MG X 42 PO MISC: ORAL | 0 refills | Status: DC

## 2018-10-07 NOTE — Telephone Encounter (Signed)
Called and spoke to patient, patient states he was taking Chantix starter pack which helped him stop smoking. He states he stopped taking the Chantix and started back smoking however he would like to try to go back on the starter pack. The patient states he has some left over from the original pack and wanted to know if he could just resume taking them.   PM please advise if patient can resume original chantix pack or would you like a new order placed?

## 2018-10-07 NOTE — Telephone Encounter (Signed)
Called and spoke with Patient.  New order for Chantix starter pack placed.  Patient stated understanding.  Prescription sent to Patient preferred pharmacy.  Nothing further at this time.

## 2018-10-07 NOTE — Telephone Encounter (Signed)
Place new order for chantix

## 2018-10-16 ENCOUNTER — Telehealth: Payer: Self-pay | Admitting: Pulmonary Disease

## 2018-10-16 NOTE — Telephone Encounter (Signed)
Ricardo Garfinkel, MD       10/07/18 4:55 PM  Note    Place new order for chantix          10/07/18 3:11 PM  Ricardo Ewing, LPN routed this conversation to Ricardo Garfinkel, MD  Ricardo Burch, [name]       65/9/93 5:70 PM  Note    Called and spoke to patient, patient states he was taking Chantix starter pack which helped him stop smoking. He states he stopped taking the Chantix and started back smoking however he would like to try to go back on the starter pack. The patient states he has some left over from the original pack and wanted to know if he could just resume taking them.   PM please advise if patient can resume original chantix pack or would you like a new order placed?      ______________________________________________________________________________________________  Ricardo Burch and spoke with pharmacy, advised them of message above with starter pack. They verbalized understanding. Nothing further needed. Starter pack is set to be delivered Monday. Nothing further needed.

## 2018-10-20 DIAGNOSIS — J449 Chronic obstructive pulmonary disease, unspecified: Secondary | ICD-10-CM | POA: Diagnosis not present

## 2018-10-22 DIAGNOSIS — J449 Chronic obstructive pulmonary disease, unspecified: Secondary | ICD-10-CM | POA: Diagnosis not present

## 2018-10-29 ENCOUNTER — Other Ambulatory Visit: Payer: Self-pay | Admitting: Internal Medicine

## 2018-10-29 ENCOUNTER — Other Ambulatory Visit: Payer: Self-pay | Admitting: Primary Care

## 2018-10-29 DIAGNOSIS — J438 Other emphysema: Secondary | ICD-10-CM

## 2018-11-06 ENCOUNTER — Ambulatory Visit (INDEPENDENT_AMBULATORY_CARE_PROVIDER_SITE_OTHER): Payer: Medicare Other | Admitting: Student

## 2018-11-06 ENCOUNTER — Encounter: Payer: Self-pay | Admitting: Student

## 2018-11-06 VITALS — BP 118/78 | HR 98 | Ht 67.0 in | Wt 258.0 lb

## 2018-11-06 DIAGNOSIS — I2729 Other secondary pulmonary hypertension: Secondary | ICD-10-CM

## 2018-11-06 DIAGNOSIS — E782 Mixed hyperlipidemia: Secondary | ICD-10-CM | POA: Diagnosis not present

## 2018-11-06 DIAGNOSIS — J449 Chronic obstructive pulmonary disease, unspecified: Secondary | ICD-10-CM | POA: Diagnosis not present

## 2018-11-06 DIAGNOSIS — Z9989 Dependence on other enabling machines and devices: Secondary | ICD-10-CM

## 2018-11-06 DIAGNOSIS — G4733 Obstructive sleep apnea (adult) (pediatric): Secondary | ICD-10-CM

## 2018-11-06 DIAGNOSIS — Z720 Tobacco use: Secondary | ICD-10-CM

## 2018-11-06 DIAGNOSIS — I10 Essential (primary) hypertension: Secondary | ICD-10-CM

## 2018-11-06 DIAGNOSIS — I50810 Right heart failure, unspecified: Secondary | ICD-10-CM

## 2018-11-06 NOTE — Progress Notes (Signed)
Cardiology Office Note    Date:  11/07/2018   ID:  Leeland Lovelady, DOB 1960-12-15, MRN [id number]  PCP:  Doree Albee, MD  Cardiologist: Kate Sable, MD    Chief Complaint  Patient presents with  . Hospitalization Follow-up    History of Present Illness:    Zackrey Dyar is a 58 y.o. male with past medical history of chronic diastolic CHF, HTN, HLD, COPD, OSA, tobacco use, and history of cocaine use who presents to the office today for hospital follow-up.  He was recently admitted to [facility] in 09/2018 for evaluation of chest discomfort which had been occurring for several days prior to admission and started after he consumed pork. Symptoms were not associated with exertion and he denied any associated nausea, vomiting, or dyspnea. Symptoms had improved with belching. Initial and cyclic troponin values remained negative and EKG showed no acute ischemic changes. An echocardiogram was obtained for further assessment and showed moderate LVH, a preserved EF of 60 to 65%, no regional wall motion abnormalities, and moderately to severely dilated right ventricle. No further inpatient ischemic evaluation was pursued in the setting of his atypical symptoms.  In talking with the patient today, he reports overall doing well since his recent hospitalization. He denies any recurrent episodes of chest pain. No recent orthopnea, PND, lower extremity edema, or palpitations. Has chronic dyspnea on exertion in the setting of COPD and uses 2 L nasal cannula intermittently throughout the day. He reports good compliance with his CPAP at night.  He does not typically check his BP regularly but it is well controlled at 118/78 during today's visit. Has resumed smoking but just recently obtained refills of Chantix as he was successful with this in the past. Denies any cocaine use in several years. Does use THC occasionally per his report.    Past Medical History:  Diagnosis Date  . Allergy   . CHF  (congestive heart failure) ([place])   . Chronic kidney disease   . COPD (chronic obstructive pulmonary disease) ([place]) Dx 2015  . Depression   . Eczema    since childhood   . Hyperlipidemia 01/12/2018  . Hypertension   . Oxygen deficiency   . Sleep apnea    CPAP  . Substance abuse ([place])    MJ, cocaine    Past Surgical History:  Procedure Laterality Date  . COLONOSCOPY WITH PROPOFOL N/A 06/25/2017   Procedure: COLONOSCOPY WITH PROPOFOL;  Surgeon: Daneil Dolin, MD;  [facility];  Service: Endoscopy;  Laterality: N/A;  2:00pm  . MULTIPLE EXTRACTIONS WITH ALVEOLOPLASTY N/A 03/22/2016   Procedure: MULTIPLE EXTRACTION WITH ALVEOLOPLASTY;  Surgeon: Diona Browner, DDS;  Location: [facility];  Service: Oral Surgery;  Laterality: N/A;  . NO PAST SURGERIES    . POLYPECTOMY  06/25/2017   Procedure: POLYPECTOMY;  Surgeon: Daneil Dolin, MD;  [facility];  Service: Endoscopy;;  colon    Current Medications: Outpatient Medications Prior to Visit  Medication Sig Dispense Refill  . amLODipine (NORVASC) 5 MG tablet Take 1 tablet (5 mg total) by mouth daily. 90 tablet 3  . ANORO ELLIPTA 62.5-25 MCG/INH AEPB INHALE 1 PUFF BY MOUTH DAILY 60 each 3  . aspirin EC 81 MG tablet Take 1 tablet (81 mg total) by mouth daily. 30 tablet 0  . atorvastatin (LIPITOR) 40 MG tablet Take 40 mg by mouth every morning.     [name] DALIRESP 500 MCG TABS tablet TAKE 1 TABLET BY MOUTH ONCE DAILY  30 tablet 3  . fluticasone (FLONASE) 50 MCG/ACT nasal spray INSTILL 2 SPRAYS IN EACH NOSTRIL TWICE DAILY 16 g 1  . OXYGEN Inhale 2 L into the lungs at bedtime.     . triamcinolone cream (KENALOG) 0.1 % Apply 1 application topically 3 (three) times daily. Apply to affected areas 453.6 g 0  . varenicline (CHANTIX STARTING MONTH PAK) 0.5 MG X 11 & 1 MG X 42 tablet Take 1 0.5 mg tablet once daily for 3 days, increase to 1 0.5 mg tablet twice daily for 4 days, increase to 1 1 mg tablet twice daily. 53 tablet 0  . VENTOLIN  HFA 108 (90 Base) MCG/ACT inhaler INHALE 1 OR 2 PUFFS BY MOUTH EVERY 6 HOURS AS NEEDED FOR WHEEZING OR SHORTNESS OF BREATH 18 each 0   No facility-administered medications prior to visit.      Allergies:   Apresoline [hydralazine]   Social History   Socioeconomic History  . Marital status: Single    Spouse name: Not on file  . Number of children: 5  . Years of education: 52  . Highest education level: Not on file  Occupational History  . Occupation: Unemployed     Comment: car wash details  Social Needs  . Financial resource strain: Not on file  . Food insecurity:    Worry: Not on file    Inability: Not on file  . Transportation needs:    Medical: Not on file    Non-medical: Not on file  Tobacco Use  . Smoking status: Former Smoker    Packs/day: 0.25    Years: 38.00    Pack years: 9.50    Types: Cigarettes    Last attempt to quit: 08/24/2018    Years since quitting: 0.2  . Smokeless tobacco: Never Used  Substance and Sexual Activity  . Alcohol use: Yes    Alcohol/week: 1.0 standard drinks    Types: 1 Cans of beer per week    Comment: occassionally: weekends, 6-pack or less on a day; or half pint of liquior  . Drug use: Yes    Types: Marijuana, Cocaine    Comment: not current  . Sexual activity: Never  Lifestyle  . Physical activity:    Days per week: Not on file    Minutes per session: Not on file  . Stress: Not on file  Relationships  . Social connections:    Talks on phone: Not on file    Gets together: Not on file    Attends religious service: Not on file    Active member of club or organization: Not on file    Attends meetings of clubs or organizations: Not on file    Relationship status: Not on file  Other Topics Concern  . Not on file  Social History Narrative    Lives alone.    5 daughters 84, 61 yo twins, eldest 2 are married live in [place].    Works part time in a car wash     Family History:  The patient's family history includes Alcohol  abuse in his father; Arthritis in his mother; Cancer in his brother; Cerebral aneurysm in his father; Hypertension in his mother; Stroke in his mother.   Review of Systems:   Please see the history of present illness.     General:  No chills, fever, night sweats or weight changes.  Cardiovascular:  No chest pain, edema, orthopnea, palpitations, paroxysmal nocturnal dyspnea. Positive for dyspnea on exertion (chronic).  Dermatological: No rash, lesions/masses Respiratory: No cough, dyspnea Urologic: No hematuria, dysuria Abdominal:   No nausea, vomiting, diarrhea, bright red blood per rectum, melena, or hematemesis Neurologic:  No visual changes, wkns, changes in mental status. All other systems reviewed and are otherwise negative except as noted above.   Physical Exam:    VS:  BP 118/78   Pulse 98   Ht 5\' 7"  (1.702 m)   Wt 258 lb (117 kg)   SpO2 96%   BMI 40.41 kg/m    General: Well developed, well nourished [place] American male appearing in no acute distress. Head: Normocephalic, atraumatic, sclera non-icteric, no xanthomas, nares are without discharge.  Neck: No carotid bruits. JVD not elevated.  Lungs: Respirations regular and unlabored, without wheezes or rales.  Heart: Regular rate and rhythm. No S3 or S4.  No murmur, no rubs, or gallops appreciated. Abdomen: Soft, non-tender, non-distended with normoactive bowel sounds. No hepatomegaly. No rebound/guarding. No obvious abdominal masses. Msk:  Strength and tone appear normal for age. No joint deformities or effusions. Extremities: No clubbing or cyanosis. No lower extremity edema.  Distal pedal pulses are 2+ bilaterally. Neuro: Alert and oriented X 3. Moves all extremities spontaneously. No focal deficits noted. Psych:  Responds to questions appropriately with a normal affect. Skin: No rashes or lesions noted  Wt Readings from Last 3 Encounters:  11/06/18 258 lb (117 kg)  09/23/18 259 lb 0.6 oz (117.5 kg)  09/14/18 259 lb  3.2 oz (117.6 kg)     Studies/Labs Reviewed:   EKG:  EKG is not ordered today.   Recent Labs: 09/23/2018: Hemoglobin 16.8; Platelets 196 09/24/2018: ALT 22; BUN 11; Creatinine, Ser 0.79; Potassium 4.4; Sodium 135   Lipid Panel    Component Value Date/Time   CHOL 91 09/24/2018 0630   TRIG 43 09/24/2018 0630   HDL 33 (L) 09/24/2018 0630   CHOLHDL 2.8 09/24/2018 0630   VLDL 9 09/24/2018 0630   LDLCALC 49 09/24/2018 0630   LDLCALC 60 10/14/2017 1141    Additional studies/ records that were reviewed today include:   Echocardiogram: 09/2018 Study Conclusions  - Left ventricle: The cavity size was normal. Wall thickness was   increased in a pattern of moderate LVH. Systolic function was   normal. The estimated ejection fraction was in the range of 60%   to 65%. Wall motion was normal; there were no regional wall   motion abnormalities. The study is not technically sufficient to   allow evaluation of LV diastolic function. - Aortic valve: Valve area (VTI): 2.53 cm^2. Valve area (Vmax):   2.11 cm^2. Valve area (Vmean): 1.82 cm^2. - Right ventricle: The cavity size was moderately to severely   dilated. - Right atrium: The atrium was moderately dilated. - Technically difficult study. Echocontrast was used to enhance   visualization.   Assessment:    1. Right heart failure due to pulmonary hypertension ([place])   2. Essential hypertension   3. Mixed hyperlipidemia   4. COPD, severe ([place])   5. OSA on CPAP   6. Tobacco use      Plan:   In order of problems listed above:  1. Right Heart Failure due to Pulmonary HTN - most recent echocardiogram showed a preserved EF of 60 to 65% with moderately to severely dilated right ventricle. He denies any recent orthopnea, PND, or lower extremity edema and appears euvolemic on examination.  - not currently on diuretic therapy. Continued compliance with a low-sodium diet and fluid restriction reviewed.  2. HTN - BP is well  controlled at 118/78 during today's visit.  - continue Amlodipine 5mg  daily.   3. HLD - followed by PCP. FLP in 09/2018 showed total cholesterol of 91, HDL 33, and LDL 49. Remains on Atorvastatin 40mg  daily.   4. COPD/OSA - he uses 2L [place] intermittently throughout the day and CPAP at night. Saturations appropriate at 96% on 2L [place] during today's visit. Continued compliance with CPAP encouraged.   5. Tobacco Use - he has resumed smoking but just recently obtained refills of Chantix as he was successful with this in the past.   Medication Adjustments/Labs and Tests Ordered: Current medicines are reviewed at length with the patient today.  Concerns regarding medicines are outlined above.  Medication changes, Labs and Tests ordered today are listed in the Patient Instructions below. Patient Instructions  Medication Instructions:  Your physician recommends that you continue on your current medications as directed. Please refer to the Current Medication list given to you today.  If you need a refill on your cardiac medications before your next appointment, please call your pharmacy.   Lab work: NONE  If you have labs (blood work) drawn today and your tests are completely normal, you will receive your results only by: [name] MyChart Message (if you have MyChart) OR . A paper copy in the mail If you have any lab test that is abnormal or we need to change your treatment, we will call you to review the results.  Testing/Procedures: NONE   Follow-Up: At [facility], you and your health needs are our priority.  As part of our continuing mission to provide you with exceptional heart care, we have created designated Provider Care Teams.  These Care Teams include your primary Cardiologist (physician) and Advanced Practice Providers (APPs -  Physician Assistants and Nurse Practitioners) who all work together to provide you with the care you need, when you need it. You will need a follow up appointment  in 1 years.  Please call our office 2 months in advance to schedule this appointment.  You may see Kate Sable, MD or one of the following Advanced Practice Providers on your designated Care Team:   Bernerd Pho, PA-C [facility]) . Ermalinda Barrios, PA-C ([facility])  Any Other Special Instructions Will Be Listed Below (If Applicable). Thank you for choosing [facility]!     Signed, Erma Heritage, PA-C  11/07/2018 8:07 AM    [address] [place], [place] [address] Phone: [phone]

## 2018-11-06 NOTE — Patient Instructions (Signed)
Medication Instructions:  Your physician recommends that you continue on your current medications as directed. Please refer to the Current Medication list given to you today.  If you need a refill on your cardiac medications before your next appointment, please call your pharmacy.   Lab work: NONE  If you have labs (blood work) drawn today and your tests are completely normal, you will receive your results only by: . MyChart Message (if you have MyChart) OR . A paper copy in the mail If you have any lab test that is abnormal or we need to change your treatment, we will call you to review the results.  Testing/Procedures: NONE   Follow-Up: At [facility], you and your health needs are our priority.  As part of our continuing mission to provide you with exceptional heart care, we have created designated Provider Care Teams.  These Care Teams include your primary Cardiologist (physician) and Advanced Practice Providers (APPs -  Physician Assistants and Nurse Practitioners) who all work together to provide you with the care you need, when you need it. You will need a follow up appointment in 1 years.  Please call our office 2 months in advance to schedule this appointment.  You may see Suresh Koneswaran, MD or one of the following Advanced Practice Providers on your designated Care Team:   Brittany Strader, PA-C ([place] Office) . Michele Lenze, PA-C ([place] Office)  Any Other Special Instructions Will Be Listed Below (If Applicable). Thank you for choosing [facility]!     

## 2018-11-07 ENCOUNTER — Encounter: Payer: Self-pay | Admitting: Student

## 2018-11-20 DIAGNOSIS — J449 Chronic obstructive pulmonary disease, unspecified: Secondary | ICD-10-CM | POA: Diagnosis not present

## 2018-11-22 DIAGNOSIS — J449 Chronic obstructive pulmonary disease, unspecified: Secondary | ICD-10-CM | POA: Diagnosis not present

## 2018-11-25 DIAGNOSIS — R7302 Impaired glucose tolerance (oral): Secondary | ICD-10-CM | POA: Diagnosis not present

## 2018-11-25 DIAGNOSIS — N529 Male erectile dysfunction, unspecified: Secondary | ICD-10-CM | POA: Diagnosis not present

## 2018-11-25 DIAGNOSIS — I10 Essential (primary) hypertension: Secondary | ICD-10-CM | POA: Diagnosis not present

## 2018-12-14 DIAGNOSIS — L209 Atopic dermatitis, unspecified: Secondary | ICD-10-CM | POA: Diagnosis not present

## 2018-12-21 DIAGNOSIS — J449 Chronic obstructive pulmonary disease, unspecified: Secondary | ICD-10-CM | POA: Diagnosis not present

## 2018-12-23 DIAGNOSIS — J449 Chronic obstructive pulmonary disease, unspecified: Secondary | ICD-10-CM | POA: Diagnosis not present

## 2018-12-30 DIAGNOSIS — J449 Chronic obstructive pulmonary disease, unspecified: Secondary | ICD-10-CM | POA: Diagnosis not present

## 2019-01-07 DIAGNOSIS — H109 Unspecified conjunctivitis: Secondary | ICD-10-CM | POA: Diagnosis not present

## 2019-01-13 ENCOUNTER — Other Ambulatory Visit: Payer: Self-pay

## 2019-01-13 ENCOUNTER — Encounter (HOSPITAL_COMMUNITY): Payer: Self-pay | Admitting: Emergency Medicine

## 2019-01-13 ENCOUNTER — Emergency Department (HOSPITAL_COMMUNITY)
Admission: EM | Admit: 2019-01-13 | Discharge: 2019-01-13 | Discharge status: Against Medical Advice | Payer: Medicare Other | Attending: Emergency Medicine | Admitting: Emergency Medicine

## 2019-01-13 DIAGNOSIS — M25569 Pain in unspecified knee: Secondary | ICD-10-CM | POA: Diagnosis not present

## 2019-01-13 DIAGNOSIS — Z5321 Procedure and treatment not carried out due to patient leaving prior to being seen by health care provider: Secondary | ICD-10-CM | POA: Insufficient documentation

## 2019-01-13 NOTE — ED Triage Notes (Signed)
Pt c/o right knee pain and swelling x 2 days after working doing a lot of  bending

## 2019-01-20 ENCOUNTER — Other Ambulatory Visit: Payer: Self-pay | Admitting: Nurse Practitioner

## 2019-01-20 DIAGNOSIS — M25561 Pain in right knee: Secondary | ICD-10-CM

## 2019-01-20 DIAGNOSIS — R609 Edema, unspecified: Secondary | ICD-10-CM

## 2019-01-22 ENCOUNTER — Ambulatory Visit
Admission: RE | Admit: 2019-01-22 | Discharge: 2019-01-22 | Disposition: A | Discharge status: Home / Self Care | Payer: Medicare Other | Source: Ambulatory Visit | Attending: Nurse Practitioner | Admitting: Nurse Practitioner

## 2019-01-22 DIAGNOSIS — R609 Edema, unspecified: Secondary | ICD-10-CM

## 2019-01-22 DIAGNOSIS — M25561 Pain in right knee: Secondary | ICD-10-CM

## 2019-01-26 ENCOUNTER — Telehealth: Payer: Self-pay | Admitting: Orthopedic Surgery

## 2019-01-26 NOTE — Telephone Encounter (Signed)
Schedule virtual visit 

## 2019-01-26 NOTE — Telephone Encounter (Signed)
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 Anastasio Champion. He has had a [place] of his R Knee on 01/22/19 at [facility]. Please review his notes, [facility] and advise.  Thanks

## 2019-01-27 ENCOUNTER — Other Ambulatory Visit: Payer: Self-pay

## 2019-01-27 ENCOUNTER — Telehealth (INDEPENDENT_AMBULATORY_CARE_PROVIDER_SITE_OTHER): Payer: Medicare Other | Admitting: Orthopedic Surgery

## 2019-01-27 DIAGNOSIS — M25561 Pain in right knee: Secondary | ICD-10-CM | POA: Diagnosis not present

## 2019-01-27 MED ORDER — IBUPROFEN 800 MG PO TABS: 800.0000 mg | ORAL_TABLET | Freq: Three times a day (TID) | ORAL | 0 refills | Status: DC | PRN

## 2019-01-27 NOTE — Telephone Encounter (Signed)
-----   Message from Carole Civil, MD sent at 01/27/2019  2:43 PM EDT ----- Regarding: return to clinic Follow up in 4 weeks

## 2019-01-27 NOTE — Patient Instructions (Signed)
Continue ibuprofen 800 mg 3 times a day  Use ice as needed for swelling  If symptoms worsen call the office for instruction

## 2019-01-27 NOTE — Telephone Encounter (Signed)
Called patient; scheduled accordingly; aware. 

## 2019-01-27 NOTE — Progress Notes (Signed)
Virtual Visit via Video Note  I connected with Ricardo Burch on 01/27/19 at  2:40 PM EDT by a video enabled telemedicine application and verified that I am speaking with the correct person using two identifiers.   I discussed the limitations of evaluation and management by telemedicine and the availability of in person appointments. The patient expressed understanding and agreed to proceed.  History of Present Illness:  Chief complaint pain right knee  58 year old male with COPD on oxygen complains of pain in his right knee which started about a month ago.  The patient says he went out of town he thinks something may have bit his leg it was not infectious in his knee seemed fine and then a week after that out-of-town visit he put some brakes on a car and he was getting up and down kneeling bending squatting and he noted that it was hard for him to get up from a seated position or squatting position and his knee started hurting.  He developed swelling and went to see his doctor.  Dr. Anastasio Champion saw him evaluated him and put him on 800 mg of ibuprofen which he took and noted that his knee stopped hurting.  His only problem is that when he is in a seated position and gets back up he has some discomfort and has difficulty getting going and he notices swelling at the end of the day which goes down overnight  He had no warmth and no erythema to the knee during his evaluation with Dr. Anastasio Champion but an appointment was made to see an orthopedist.  He is out of his ibuprofen although it seemed to be working well   Observations/Objective:  He says that the swelling has gone down except for just above the knee there is a Nibert amount of swelling there.  His pain is 5-6 out of 10 when he is getting up but when he is walking he has little to 0 pain   Ultrasound:  IMPRESSION: Large slightly complex fluid collection about the knee, with greatest concentration of fluid in the anterior knee superior to the patella.  Findings could represent large complex knee effusion. If joint infection is suspected, correlation with MRI would be advised.     Electronically Signed   By: Donavan Foil M.D.   On: 01/22/2019 16:25  Assessment and Plan:  Acute pain right knee   Follow Up Instructions: I asked him to continue his ibuprofen 800 mg 3 times a day use ice for swelling and to see [place] in 4 weeks for a more thorough evaluation     I discussed the assessment and treatment plan with the patient. The patient was provided an opportunity to ask questions and all were answered. The patient agreed with the plan and demonstrated an understanding of the instructions.   The patient was advised to call back or seek an in-person evaluation if the symptoms worsen or if the condition fails to improve as anticipated.  Meds ordered this encounter  Medications  . ibuprofen (ADVIL,MOTRIN) 800 MG tablet    Sig: Take 1 tablet (800 mg total) by mouth every 8 (eight) hours as needed.    Dispense:  90 tablet    Refill:  0     I provided 12 min 30 sec  of non-face-to-face time during this encounter.   Arther Abbott, MD

## 2019-01-28 ENCOUNTER — Other Ambulatory Visit: Payer: Self-pay

## 2019-01-28 NOTE — Patient Outreach (Signed)
[facility]) Care Management  01/28/2019  Ricardo Burch Nov 02, 1961 [id number]   Medication Adherence call to Ricardo Burch spoke with patient he is due on Atorvastatin 40 mg he explain pharmacy delivers for him every 30 days on all of his medication and prefer to keep receiving a 30 days from the pharmacy patient is showing past due under [place].    [place] Management Direct Dial [phone]  Fax [phone] [email or web address]

## 2019-02-04 ENCOUNTER — Telehealth: Payer: Self-pay | Admitting: Radiology

## 2019-02-04 ENCOUNTER — Telehealth: Payer: Self-pay | Admitting: Pulmonary Disease

## 2019-02-04 ENCOUNTER — Other Ambulatory Visit: Payer: Self-pay | Admitting: Pulmonary Disease

## 2019-02-04 DIAGNOSIS — R600 Localized edema: Secondary | ICD-10-CM

## 2019-02-04 MED ORDER — ALBUTEROL SULFATE HFA 108 (90 BASE) MCG/ACT IN AERS: INHALATION_SPRAY | RESPIRATORY_TRACT | 0 refills | Status: DC

## 2019-02-04 NOTE — Telephone Encounter (Signed)
Called patient and notified rx Ventolin has been sent to his pharmacy. Nothing further needed.

## 2019-02-04 NOTE — Telephone Encounter (Signed)
I spoke to patient  He complains of swelling of his leg below the ankle, notes a line when he takes off his sock  He states it goes down at night but comes back during the day  I told him this sounds like he needs to let his primary care know or his cardiologist (he has CHF)   He states he thinks it is from his knee, and not medical, I told him I would double check with you, but sounds like it is something he needs to discuss with PCP or his cardiologist.  Please advise

## 2019-02-05 ENCOUNTER — Telehealth: Payer: Self-pay | Admitting: Orthopedic Surgery

## 2019-02-05 NOTE — Telephone Encounter (Signed)
[name] said that he went to [facility] to be fitted for the compression stockings.  He said that they are not allowing people into the store due to the coronavirus pandemic.    He said he will just continue as he has been doing and will elevate his legs/feet whenever he is sitting or lying down.  He will get back with them as soon as the store re-opens to the public.

## 2019-02-05 NOTE — Telephone Encounter (Signed)
Clearly he does not follow instructions, I told him to call and they will get him in to be fitted, if he prefers to wait until after COVID 19 that is fine.

## 2019-02-05 NOTE — Telephone Encounter (Signed)
Order some knee hi compression stockings and elevate the leg  Not acute enough for in person visit

## 2019-02-05 NOTE — Telephone Encounter (Signed)
I have faxed order to [facility] and advised patient to call there to let them know he needs to come in for the fitting of the stockings

## 2019-02-22 ENCOUNTER — Ambulatory Visit (INDEPENDENT_AMBULATORY_CARE_PROVIDER_SITE_OTHER): Payer: Medicare Other | Admitting: Internal Medicine

## 2019-02-24 ENCOUNTER — Other Ambulatory Visit: Payer: Self-pay

## 2019-02-24 ENCOUNTER — Ambulatory Visit (INDEPENDENT_AMBULATORY_CARE_PROVIDER_SITE_OTHER): Payer: Medicare Other

## 2019-02-24 ENCOUNTER — Ambulatory Visit (INDEPENDENT_AMBULATORY_CARE_PROVIDER_SITE_OTHER): Payer: Medicare Other | Admitting: Orthopedic Surgery

## 2019-02-24 ENCOUNTER — Encounter: Payer: Self-pay | Admitting: Orthopedic Surgery

## 2019-02-24 VITALS — BP 140/85 | HR 83 | Temp 97.9°F | Ht 67.0 in | Wt 253.0 lb

## 2019-02-24 DIAGNOSIS — M25561 Pain in right knee: Secondary | ICD-10-CM

## 2019-02-24 DIAGNOSIS — G8929 Other chronic pain: Secondary | ICD-10-CM

## 2019-02-24 DIAGNOSIS — M1711 Unilateral primary osteoarthritis, right knee: Secondary | ICD-10-CM

## 2019-02-24 NOTE — Patient Instructions (Signed)
Apply ice to the knee at night 20 minutes to 30 minutes  Use over-the-counter Tylenol for pain  Also can use ibuprofen 400 mg 3 times a day as needed or Aleve 1 twice a day as needed

## 2019-02-24 NOTE — Progress Notes (Addendum)
Progress Note   Patient ID: Ricardo Burch, male   DOB: July 24, 1961, 58 y.o.   MRN: [id number]   Chief Complaint  Patient presents with  . Knee Problem    right knee swelling     58 year old male here for follow-up right knee.  Says the swelling only comes after walking on it all day then it goes down.  Not having any pain today.  Had ultrasound showed fluid in the knee on March 20.    Review of Systems  Constitutional: Negative for chills and fever.  Skin: Negative.   Neurological: Negative for tingling.     Allergies  Allergen Reactions  . Apresoline [Hydralazine] Other (See Comments)    Headache      BP 140/85   Pulse 83   Ht 5\' 7"  (1.702 m)   Wt 253 lb (114.8 kg)   BMI 39.63 kg/m   Physical Exam Vitals signs and nursing note reviewed.  Constitutional:      Appearance: Normal appearance.  Musculoskeletal:       Legs:  Neurological:     Mental Status: He is alert and oriented to person, place, and time.  Psychiatric:        Mood and Affect: Mood normal.      Medical decisions:   Data  Imaging:  X-ray see separate report it did show mild varus knee Souder effusion mild joint space narrowing without secondary bone changes medial compartment  Encounter Diagnosis  Name Primary?  . Chronic pain of right knee Yes    PLAN:   Treat for the swelling use ice at night use topical medications as needed for pain also can use over-the-counter NSAIDs  Follow-up in a year     Arther Abbott, MD 02/24/2019 9:07 AM

## 2019-03-02 ENCOUNTER — Other Ambulatory Visit: Payer: Self-pay | Admitting: Pulmonary Disease

## 2019-03-02 DIAGNOSIS — J438 Other emphysema: Secondary | ICD-10-CM

## 2019-03-08 IMAGING — DX DG CHEST 2V
2 series · 2 of 2 positions shown · non-contrast
Comparison: Multiple previous chest x-rays and a remote chest CT
from 2963.

CLINICAL DATA: Chest pain and shortness of breath after eating
yesterday.

EXAM:
CHEST - 2 VIEW

[chest pa]
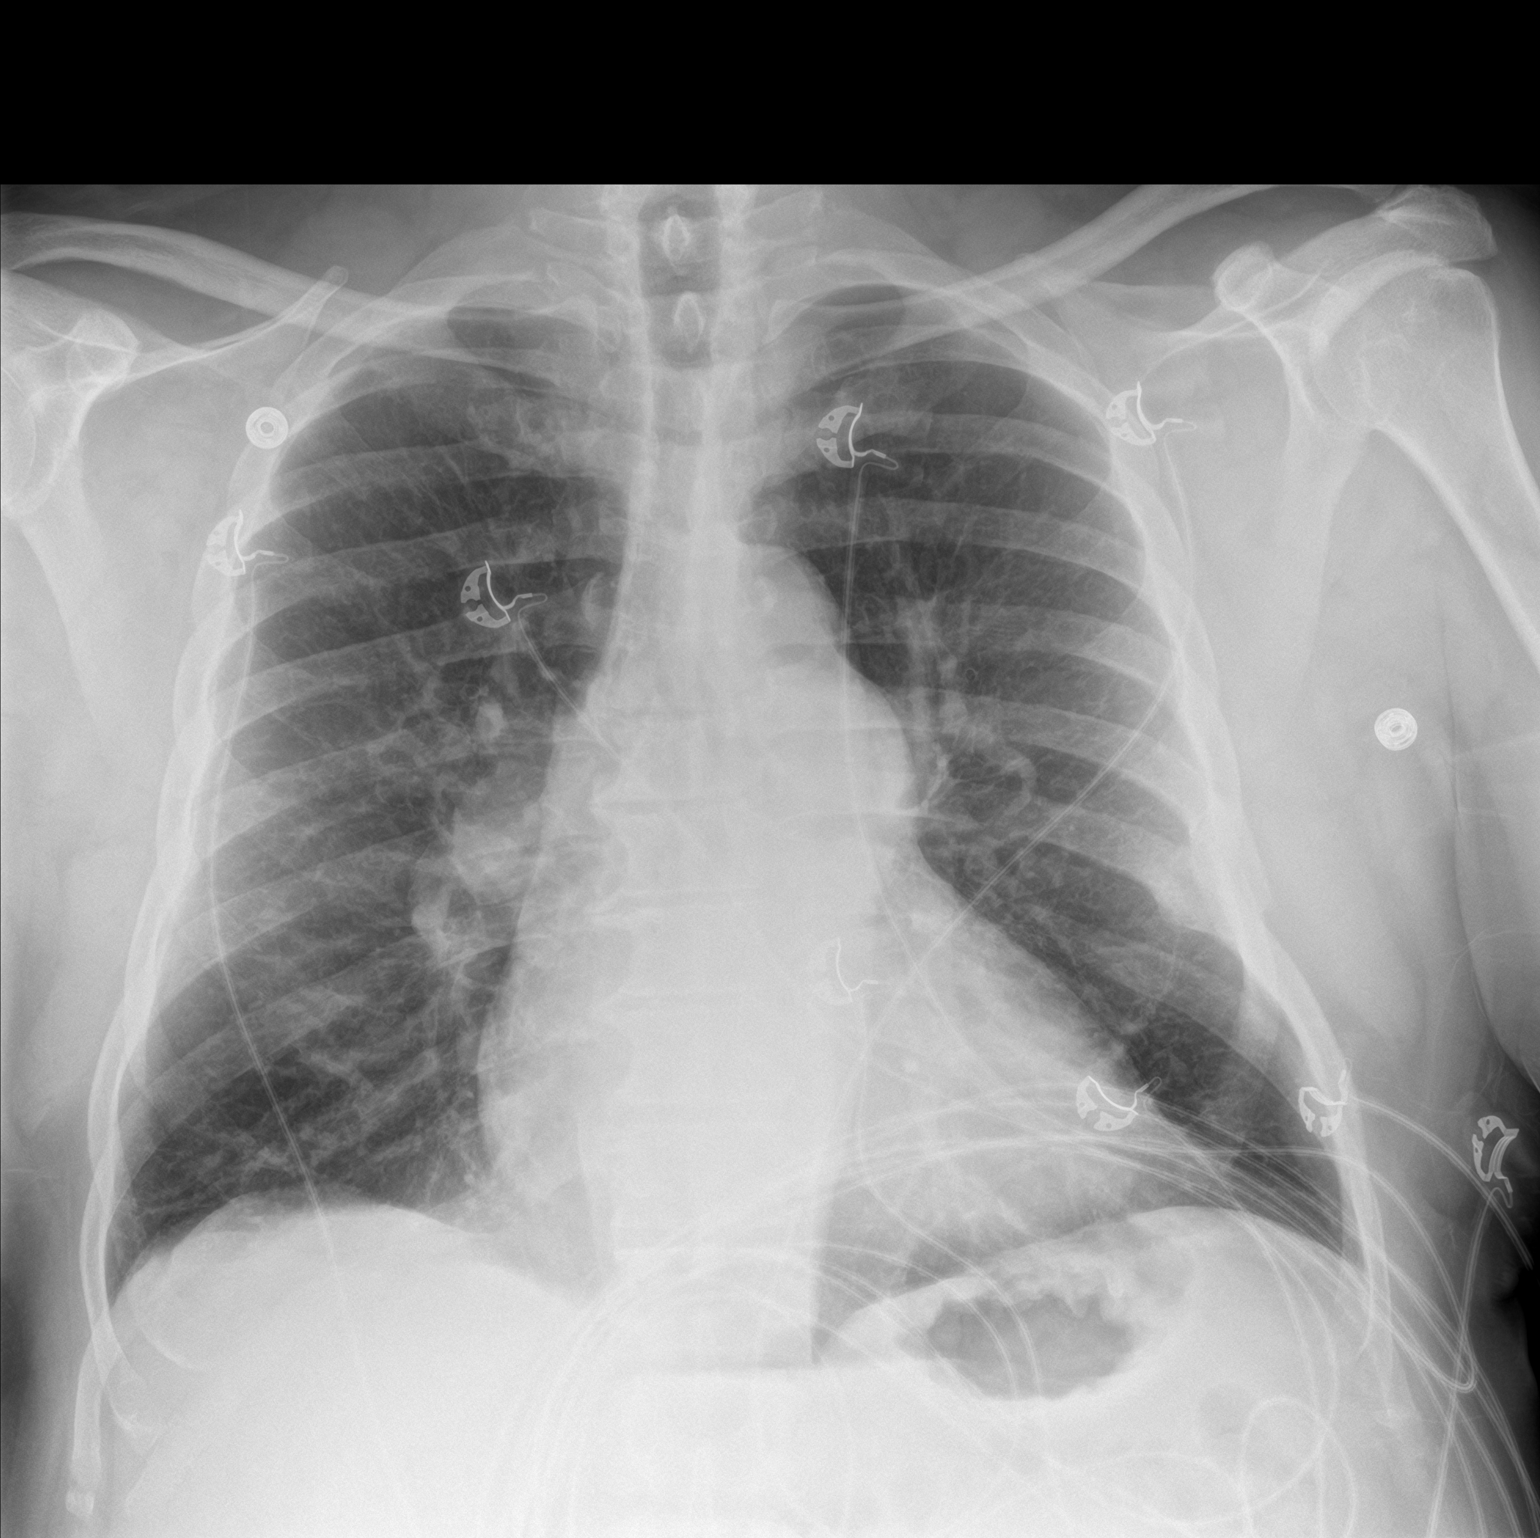

[chest lat]
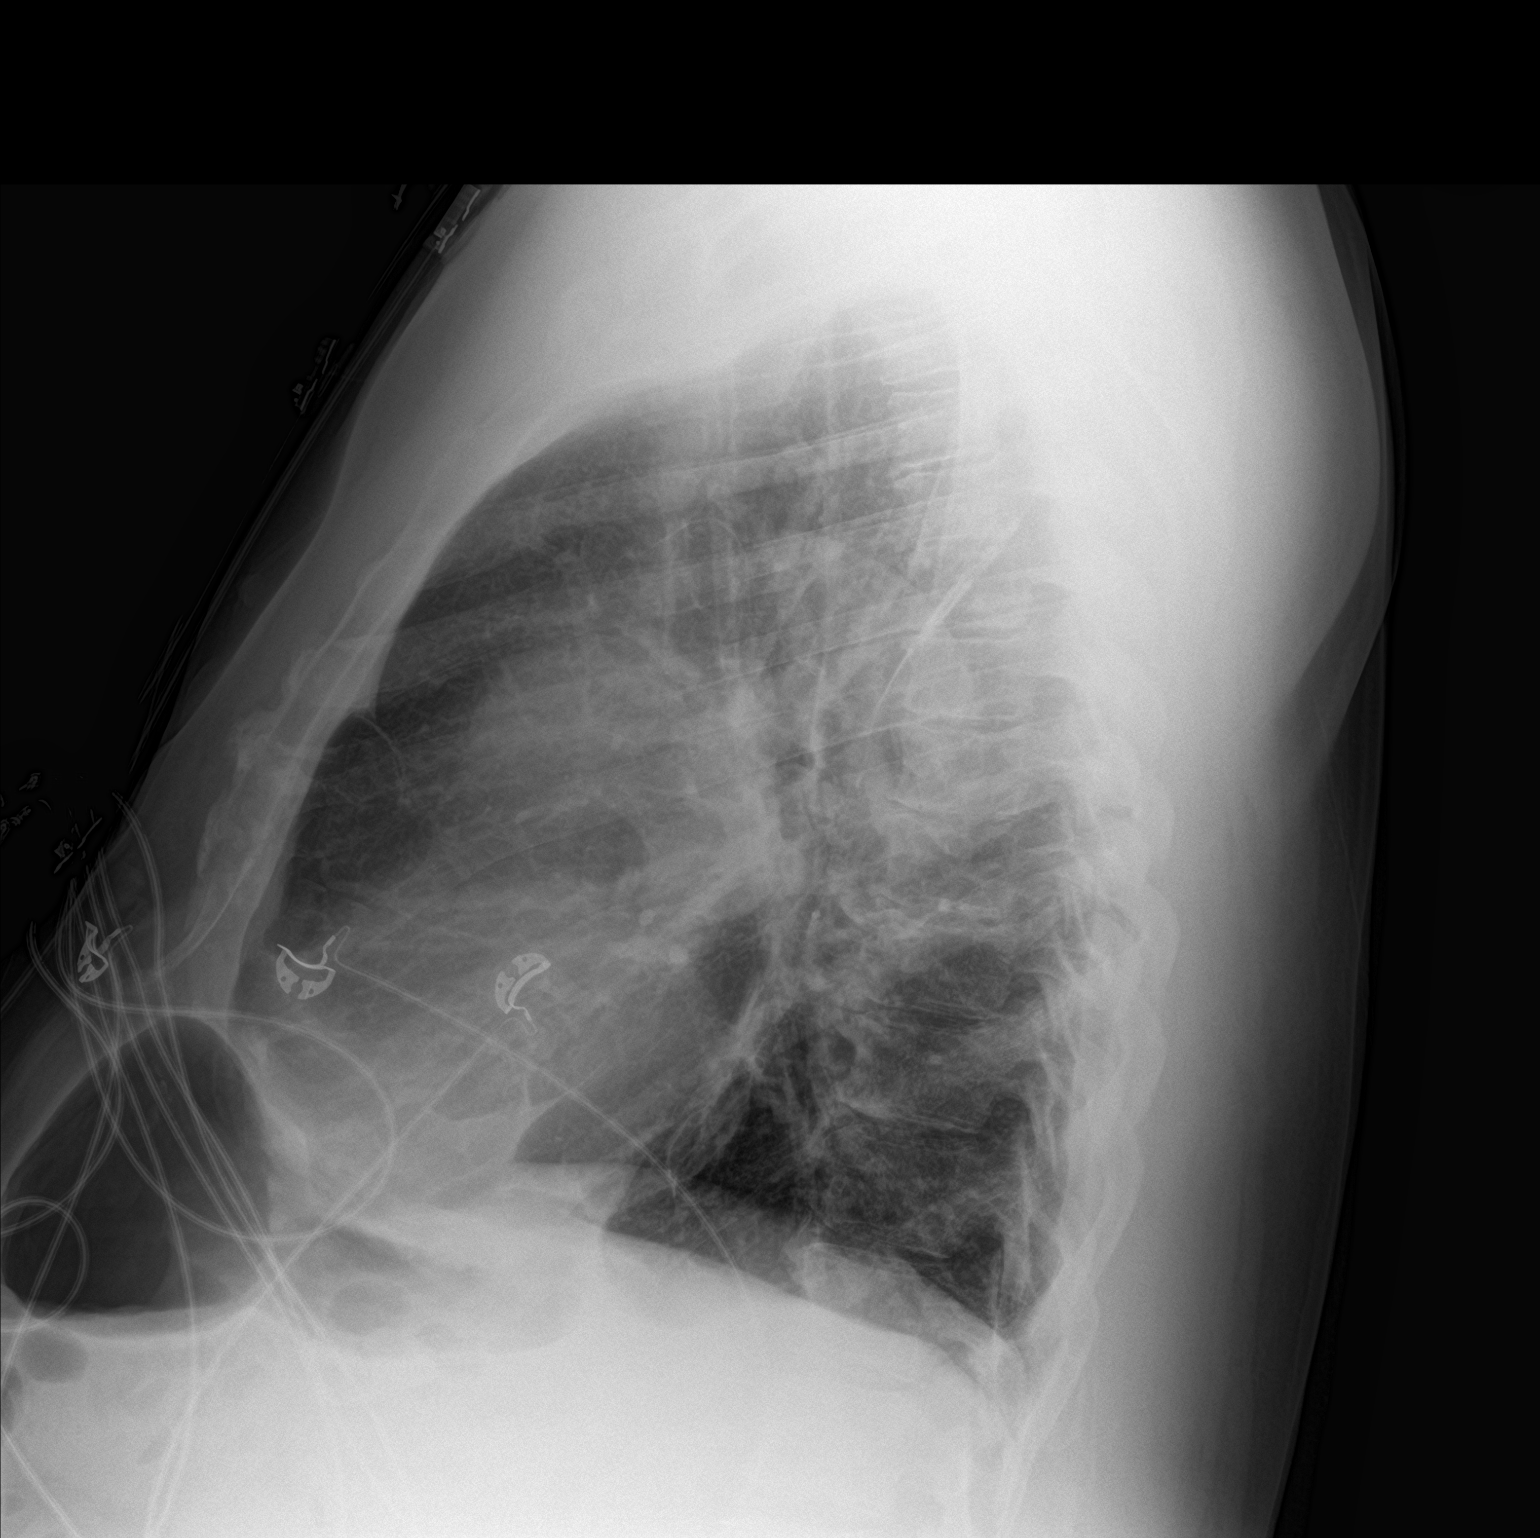

[2 of 2 positions shown; findings below may reference images not displayed]

FINDINGS: The heart is normal in size and stable. Persistent prominence in the
AP window due to a fairly enlarged pulmonary artery demonstrated on
the chest CT from 2963 where it measured 4.7 cm. The lungs are clear
of an acute process. No pulmonary lesions or pleural effusion. No
pulmonary edema. Remote healed left rib fractures are noted.
IMPRESSION: No acute cardiopulmonary findings. Stable prominence of the AP
window due to an enlarged main pulmonary artery.

## 2019-04-06 ENCOUNTER — Telehealth: Payer: Self-pay

## 2019-04-06 ENCOUNTER — Other Ambulatory Visit: Payer: Medicare Other

## 2019-04-06 DIAGNOSIS — Z20822 Contact with and (suspected) exposure to covid-19: Secondary | ICD-10-CM

## 2019-04-06 NOTE — Telephone Encounter (Signed)
Dr. Anastasio Champion request COVID 19 test.

## 2019-04-07 LAB — NOVEL CORONAVIRUS, NAA: SARS-CoV-2, NAA: NOT DETECTED

## 2019-04-13 ENCOUNTER — Other Ambulatory Visit: Payer: Self-pay | Admitting: Pulmonary Disease

## 2019-04-13 DIAGNOSIS — J438 Other emphysema: Secondary | ICD-10-CM

## 2019-05-03 ENCOUNTER — Emergency Department (HOSPITAL_COMMUNITY)
Admission: EM | Admit: 2019-05-03 | Discharge: 2019-05-03 | Disposition: A | Discharge status: Home / Self Care | Payer: Medicare Other | Attending: Emergency Medicine | Admitting: Emergency Medicine

## 2019-05-03 ENCOUNTER — Encounter (HOSPITAL_COMMUNITY): Payer: Self-pay | Admitting: Emergency Medicine

## 2019-05-03 ENCOUNTER — Emergency Department (HOSPITAL_COMMUNITY): Payer: Medicare Other

## 2019-05-03 ENCOUNTER — Other Ambulatory Visit: Payer: Self-pay

## 2019-05-03 DIAGNOSIS — F17210 Nicotine dependence, cigarettes, uncomplicated: Secondary | ICD-10-CM | POA: Insufficient documentation

## 2019-05-03 DIAGNOSIS — Z79899 Other long term (current) drug therapy: Secondary | ICD-10-CM | POA: Diagnosis not present

## 2019-05-03 DIAGNOSIS — I110 Hypertensive heart disease with heart failure: Secondary | ICD-10-CM | POA: Insufficient documentation

## 2019-05-03 DIAGNOSIS — I5032 Chronic diastolic (congestive) heart failure: Secondary | ICD-10-CM | POA: Insufficient documentation

## 2019-05-03 DIAGNOSIS — R0602 Shortness of breath: Secondary | ICD-10-CM | POA: Insufficient documentation

## 2019-05-03 DIAGNOSIS — R51 Headache: Secondary | ICD-10-CM | POA: Insufficient documentation

## 2019-05-03 DIAGNOSIS — R519 Headache, unspecified: Secondary | ICD-10-CM

## 2019-05-03 DIAGNOSIS — J449 Chronic obstructive pulmonary disease, unspecified: Secondary | ICD-10-CM | POA: Insufficient documentation

## 2019-05-03 DIAGNOSIS — R0600 Dyspnea, unspecified: Secondary | ICD-10-CM

## 2019-05-03 NOTE — ED Triage Notes (Addendum)
Pt states having nasal congestion, headache and productive cough since yesterday.  Worsening this morning.  Pt normally wears 2. States having to turn his oxygen up to 5 L.  Had a negative COVID test two weeks ago.

## 2019-05-03 NOTE — ED Provider Notes (Signed)
[facility] EMERGENCY DEPARTMENT Provider Note   CSN: [id number] Arrival date & time: 05/03/19  2706    History   Chief Complaint Chief Complaint  Patient presents with  . Nasal Congestion  . Headache    HPI Ricardo Burch is a 58 y.o. male.     HPI  58 year old male presents with chief complaint of headache but also concerned about his oxygen level.  He has COPD and intermittently smokes, currently is smoking as recently as yesterday.  He does not feel more short of breath than typical.  Usually he does not use oxygen but notices when he walks his O2 sats will go down to 88%.  He feels like over the last week or so the O2 sats have gone down a little bit sooner when he checks or down to about 85.  However he is not short of breath or feeling any new symptoms.  He feels a little congested in the morning but no cough or fever.  He feels fine at this time.  He also noticed a headache that he thinks woke him up around 3 AM.  Occipital and was around a 7 out of 10.  Was behind both eyes.  Currently it is much better and while it still there it is almost gone.  No vomiting, neck pain, weakness/numbness.  Past Medical History:  Diagnosis Date  . Allergy   . CHF (congestive heart failure) ([place])   . Chronic kidney disease   . COPD (chronic obstructive pulmonary disease) ([place]) Dx 2015  . Depression   . Eczema    since childhood   . Hyperlipidemia 01/12/2018  . Hypertension   . Oxygen deficiency   . Sleep apnea    CPAP  . Substance abuse ([place])    MJ, cocaine    Patient Active Problem List   Diagnosis Date Noted  . Nonspecific chest pain   . Chest pain 09/23/2018  . Chronic diastolic CHF (congestive heart failure) ([place]) 09/23/2018  . Premature ejaculation 01/12/2018  . Hyperlipidemia 01/12/2018  . Hypertension 10/14/2017  . Aortic atherosclerosis ([place]) 09/15/2017  . Pre-diabetes 09/15/2017  . Cocaine abuse ([place]) 07/26/2016  . Depression 07/26/2016  . OSA on CPAP 07/26/2016  .  CHF (congestive heart failure) ([place]) 06/27/2016  . Alcohol abuse 06/27/2016  . COPD (chronic obstructive pulmonary disease) ([place]) 08/23/2014  . Eczema 08/23/2014  . Right knee pain 08/23/2014  . Tobacco abuse 03/27/2014    Past Surgical History:  Procedure Laterality Date  . COLONOSCOPY WITH PROPOFOL N/A 06/25/2017   Procedure: COLONOSCOPY WITH PROPOFOL;  Surgeon: Daneil Dolin, MD;  Location: [facility];  Service: Endoscopy;  Laterality: N/A;  2:00pm  . MULTIPLE EXTRACTIONS WITH ALVEOLOPLASTY N/A 03/22/2016   Procedure: MULTIPLE EXTRACTION WITH ALVEOLOPLASTY;  Surgeon: Diona Browner, DDS;  Location: [facility];  Service: Oral Surgery;  Laterality: N/A;  . NO PAST SURGERIES    . POLYPECTOMY  06/25/2017   Procedure: POLYPECTOMY;  Surgeon: Daneil Dolin, MD;  Location: [facility];  Service: Endoscopy;;  colon        Home Medications    Prior to Admission medications   Medication Sig Start Date End Date Taking? Authorizing Provider  albuterol (PROVENTIL HFA;VENTOLIN HFA) 108 (90 Base) MCG/ACT inhaler INHALE 1 OR 2 PUFFS BY MOUTH EVERY 6 HOURS AS NEEDED FOR WHEEZING OR SHORTNESS OF BREATH 02/05/19   Mannam, Praveen, MD  amLODipine (NORVASC) 5 MG tablet Take 1 tablet (5 mg total) by mouth daily. 05/12/18  Dorie Rank, MD  ANORO ELLIPTA 62.5-25 MCG/INH AEPB INHALE 1 PUFF BY MOUTH DAILY 04/14/19   Marshell Garfinkel, MD  aspirin EC 81 MG tablet Take 1 tablet (81 mg total) by mouth daily. 09/20/15   Florencia Reasons, MD  atorvastatin (LIPITOR) 40 MG tablet Take 40 mg by mouth every morning.  04/21/18   [provider]  DALIRESP 500 MCG TABS tablet TAKE 1 TABLET BY MOUTH ONCE DAILY 04/14/19   Mannam, Praveen, MD  fluticasone (FLONASE) 50 MCG/ACT nasal spray INSTILL 2 SPRAYS IN EACH NOSTRIL TWICE DAILY 02/05/19   Mannam, Praveen, MD  ibuprofen (ADVIL,MOTRIN) 800 MG tablet Take 1 tablet (800 mg total) by mouth every 8 (eight) hours as needed. 01/27/19   Carole Civil, MD  OXYGEN Inhale 2 L into the  lungs at bedtime.     [provider]  testosterone cypionate (DEPOTESTOSTERONE CYPIONATE) 200 MG/ML injection Inject 100 mg into the muscle every 14 (fourteen) days.    [provider]  triamcinolone cream (KENALOG) 0.1 % Apply 1 application topically 3 (three) times daily. Apply to affected areas 04/07/18   Triplett, Tammy, PA-C  varenicline (CHANTIX STARTING MONTH PAK) 0.5 MG X 11 & 1 MG X 42 tablet Take 1 0.5 mg tablet once daily for 3 days, increase to 1 0.5 mg tablet twice daily for 4 days, increase to 1 1 mg tablet twice daily. Patient not taking: Reported on 02/24/2019 10/07/18   Marshell Garfinkel, MD    Family History Family History  Problem Relation Age of Onset  . Arthritis Mother   . Hypertension Mother   . Stroke Mother        age 64  . Cerebral aneurysm Father   . Alcohol abuse Father   . Cancer Brother   . Diabetes Neg Hx   . Heart disease Neg Hx     Social History Social History   Tobacco Use  . Smoking status: Current Some Day Smoker    Packs/day: 0.25    Years: 38.00    Pack years: 9.50    Types: Cigarettes    Last attempt to quit: 08/24/2018    Years since quitting: 0.6  . Smokeless tobacco: Never Used  Substance Use Topics  . Alcohol use: Yes    Alcohol/week: 1.0 standard drinks    Types: 1 Cans of beer per week    Comment: occassionally: weekends, 6-pack or less on a day; or half pint of liquior  . Drug use: Not Currently    Types: Marijuana, Cocaine    Comment: not current     Allergies   Apresoline [hydralazine]   Review of Systems Review of Systems  Constitutional: Negative for fever.  Respiratory: Negative for cough and shortness of breath.   Cardiovascular: Negative for chest pain and leg swelling.  Gastrointestinal: Negative for vomiting.  Musculoskeletal: Negative for neck pain.  Neurological: Positive for headaches. Negative for weakness and numbness.  All other systems reviewed and are negative.    Physical Exam  Updated Vital Signs BP (!) 147/89   Pulse 82   Temp 98.1 F (36.7 C) (Oral)   Resp 19   Ht 5\' 8"  (1.727 m)   Wt 114.8 kg   SpO2 97%   BMI 38.47 kg/m   Physical Exam Vitals signs and nursing note reviewed.  Constitutional:      General: He is not in acute distress.    Appearance: He is well-developed. He is obese. He is not ill-appearing or diaphoretic.  HENT:     Head: Normocephalic and atraumatic.     Right Ear: External ear normal.     Left Ear: External ear normal.     Nose: Nose normal.  Eyes:     General:        Right eye: No discharge.        Left eye: No discharge.     Extraocular Movements: Extraocular movements intact.     Pupils: Pupils are equal, round, and reactive to light.  Neck:     Musculoskeletal: Neck supple.  Cardiovascular:     Rate and Rhythm: Normal rate and regular rhythm.     Heart sounds: Normal heart sounds.  Pulmonary:     Effort: Pulmonary effort is normal. No respiratory distress.     Breath sounds: Normal breath sounds. No wheezing, rhonchi or rales.  Abdominal:     Palpations: Abdomen is soft.     Tenderness: There is no abdominal tenderness.  Skin:    General: Skin is warm and dry.  Neurological:     Mental Status: He is alert.     Comments: CN 3-12 grossly intact. 5/5 strength in all 4 extremities. Grossly normal sensation. Normal finger to nose.   Psychiatric:        Mood and Affect: Mood is not anxious.      ED Treatments / Results  Labs (all labs ordered are listed, but only abnormal results are displayed) Labs Reviewed - No data to display  EKG None  Radiology Dg [facility] 1 View  Result Date: 05/03/2019 CLINICAL DATA:  Shortness of breath. EXAM: PORTABLE CHEST 1 VIEW COMPARISON:  12/24/2016 FINDINGS: The heart is upper limits of normal in size and stable given the AP projection and portable technique. Persistent left hilar prominence since 2017, likely enlarged pulmonary arteries. No infiltrates, edema or effusions.   No worrisome pulmonary lesions. IMPRESSION: 1. No acute cardiopulmonary findings. 2. Stable borderline cardiac enlargement and prominent left hilum. Electronically Signed   By: Marijo Sanes M.D.   On: 05/03/2019 08:52    Procedures Procedures (including critical care time)  Medications Ordered in ED Medications - No data to display   Initial Impression / Assessment and Plan / ED Course  I have reviewed the triage vital signs and the nursing notes.  Pertinent labs & imaging results that were available during my care of the patient were reviewed by me and considered in my medical decision making (see chart for details).        Patient's lungs and chest x-ray are clear.  I offered head CT given the acute nature of his headache but he declines.  No vomiting, neck pain or stiffness, or any other new/concerning symptoms so I think subarachnoid hemorrhage is less likely.  Highly doubt meningitis.  As for his mild desats when he is walking, I think this is related to continuing to smoke with COPD.  Discussed he needs to follow-up with his pulmonologist as he may need oxygen more long-term rather than as needed, and he can start this now.  Also discussed the importance of stopping smoking.  However with no current shortness of breath, cough, chest pain, my concern for infectious etiology, heart failure, PE, or ACS is very low.  Ricardo Burch was evaluated in Emergency Department on 05/03/2019 for the symptoms described in the history of present illness. He was evaluated in the context of the global COVID-19 pandemic, which necessitated consideration that the patient might be at risk for infection  with the SARS-CoV-2 virus that causes COVID-19. Institutional protocols and algorithms that pertain to the evaluation of patients at risk for COVID-19 are in a state of rapid change based on information released by regulatory bodies including the CDC and federal and state organizations. These policies and  algorithms were followed during the patient's care in the ED.   Final Clinical Impressions(s) / ED Diagnoses   Final diagnoses:  Occipital headache    ED Discharge Orders    None       Sherwood Gambler, MD 05/03/19 [phone]

## 2019-05-03 NOTE — Discharge Instructions (Addendum)
It is very important to stop smoking.  Follow-up with your primary care physician and/or pulmonologist.  If you develop continued, recurrent, or worsening headache, fever, neck stiffness, vomiting, blurry or double vision, weakness or numbness in your arms or legs, trouble speaking, or any other new/concerning symptoms then return to the ER for evaluation.

## 2019-05-12 ENCOUNTER — Other Ambulatory Visit: Payer: Self-pay | Admitting: Pulmonary Disease

## 2019-05-12 DIAGNOSIS — J438 Other emphysema: Secondary | ICD-10-CM

## 2019-05-17 ENCOUNTER — Other Ambulatory Visit: Payer: Medicare Other

## 2019-05-17 ENCOUNTER — Other Ambulatory Visit: Payer: Self-pay

## 2019-05-17 DIAGNOSIS — Z20822 Contact with and (suspected) exposure to covid-19: Secondary | ICD-10-CM

## 2019-05-21 LAB — NOVEL CORONAVIRUS, NAA: SARS-CoV-2, NAA: NOT DETECTED

## 2019-05-27 ENCOUNTER — Telehealth (INDEPENDENT_AMBULATORY_CARE_PROVIDER_SITE_OTHER): Payer: Self-pay | Admitting: Internal Medicine

## 2019-05-27 NOTE — Telephone Encounter (Signed)
Patient called back and received his results

## 2019-06-09 ENCOUNTER — Other Ambulatory Visit: Payer: Self-pay | Admitting: Pulmonary Disease

## 2019-06-10 ENCOUNTER — Ambulatory Visit: Payer: Medicare Other | Admitting: Nurse Practitioner

## 2019-06-15 ENCOUNTER — Other Ambulatory Visit: Payer: Self-pay

## 2019-06-15 ENCOUNTER — Ambulatory Visit: Payer: Medicare Other | Admitting: Pulmonary Disease

## 2019-06-15 ENCOUNTER — Ambulatory Visit (INDEPENDENT_AMBULATORY_CARE_PROVIDER_SITE_OTHER): Payer: Medicare Other | Admitting: Pulmonary Disease

## 2019-06-15 ENCOUNTER — Encounter: Payer: Self-pay | Admitting: Pulmonary Disease

## 2019-06-15 ENCOUNTER — Telehealth: Payer: Self-pay | Admitting: Pulmonary Disease

## 2019-06-15 ENCOUNTER — Other Ambulatory Visit (INDEPENDENT_AMBULATORY_CARE_PROVIDER_SITE_OTHER): Payer: Medicare Other

## 2019-06-15 VITALS — BP 138/80 | HR 90 | Temp 98.3°F | Ht 67.0 in | Wt 254.2 lb

## 2019-06-15 DIAGNOSIS — J449 Chronic obstructive pulmonary disease, unspecified: Secondary | ICD-10-CM | POA: Diagnosis not present

## 2019-06-15 DIAGNOSIS — Z23 Encounter for immunization: Secondary | ICD-10-CM

## 2019-06-15 DIAGNOSIS — Z20822 Contact with and (suspected) exposure to covid-19: Secondary | ICD-10-CM

## 2019-06-15 DIAGNOSIS — Z720 Tobacco use: Secondary | ICD-10-CM | POA: Diagnosis not present

## 2019-06-15 LAB — CBC WITH DIFFERENTIAL/PLATELET
Basophils Absolute: 0.1 10*3/uL (ref 0.0–0.1)
Basophils Relative: 0.7 % (ref 0.0–3.0)
Eosinophils Absolute: 0.1 10*3/uL (ref 0.0–0.7)
Eosinophils Relative: 1 % (ref 0.0–5.0)
HCT: 57.2 % — ABNORMAL HIGH (ref 39.0–52.0)
Hemoglobin: 18.7 g/dL (ref 13.0–17.0)
Lymphocytes Relative: 19.2 % (ref 12.0–46.0)
Lymphs Abs: 2.4 10*3/uL (ref 0.7–4.0)
MCHC: 32.7 g/dL (ref 30.0–36.0)
MCV: 94.9 fl (ref 78.0–100.0)
Monocytes Absolute: 1.1 10*3/uL — ABNORMAL HIGH (ref 0.1–1.0)
Monocytes Relative: 8.6 % (ref 3.0–12.0)
Neutro Abs: 8.9 10*3/uL — ABNORMAL HIGH (ref 1.4–7.7)
Neutrophils Relative %: 70.5 % (ref 43.0–77.0)
Platelets: 198 10*3/uL (ref 150.0–400.0)
RBC: 6.03 Mil/uL — ABNORMAL HIGH (ref 4.22–5.81)
RDW: 16 % — ABNORMAL HIGH (ref 11.5–15.5)
WBC: 12.7 10*3/uL — ABNORMAL HIGH (ref 4.0–10.5)

## 2019-06-15 NOTE — Telephone Encounter (Signed)
Received call report from Santiago Glad with [facility] on patient's hemoglobin done on 06/15/2019. Dr. Vaughan Browner please review the result/impression copied below:  Patient's Hemoglobin was 18.7 and this was repeated and verified twice.   Please advise, thank you.

## 2019-06-15 NOTE — Patient Instructions (Signed)
Continue inhalers as prescribed We will set you up for screening CTs of the chest Given pneumonia vaccine today Follow-up in 6 months.

## 2019-06-15 NOTE — Progress Notes (Signed)
Ricardo Burch    [id number]    [birth year]  Primary Care Physician:Burch, Ricardo Johns, MD  Referring Physician: Doree Albee, MD [address] [place],  [place] [address]  Chief complaint:   Follow up for Severe COPD Severe OSA on autoset Active smoker  HPI: [name] is a 58 year old active smoker, COPD. He's had multiple hospitalizations with COPD, CHF exacerbations in the past.He was hospitalized from 07/26/16-07/30/16 with acute pulmonary edema, acute exacerbation of COPD. He was intubated briefly for hypercarbia.   He has finished the pulmonary rehab and reports improved dyspnea. He has been on a rotating list of inhalers mainly due to his inability to pay co-pay. [name] He has been started on CPAP after a repeat titration study and is tolerating it well.  Interim History: Stable since last visit.  Continues on anoro, Daliresp Continues to smoke a couple of cigarettes a day. Compliant with CPAP.  Complains of chronic cough with chest congestion, dyspnea on exertion.  Outpatient Encounter Medications as of 06/15/2019  Medication Sig  . albuterol (VENTOLIN HFA) 108 (90 Base) MCG/ACT inhaler INHALE 1 OR 2 PUFFS BY MOUTH EVERY 6 HOURS AS NEEDED FOR WHEEZING OR SHORTNESS OF BREATH  . amLODipine (NORVASC) 5 MG tablet Take 1 tablet (5 mg total) by mouth daily.  Ricardo Burch ELLIPTA 62.5-25 MCG/INH AEPB INHALE 1 PUFF BY MOUTH DAILY  . aspirin EC 81 MG tablet Take 1 tablet (81 mg total) by mouth daily.  [name] atorvastatin (LIPITOR) 40 MG tablet Take 40 mg by mouth every morning.   [name] DALIRESP 500 MCG TABS tablet TAKE 1 TABLET BY MOUTH ONCE DAILY  . fluticasone (FLONASE) 50 MCG/ACT nasal spray INSTILL 2 SPRAYS IN EACH NOSTRIL TWICE DAILY  . ibuprofen (ADVIL,MOTRIN) 800 MG tablet Take 1 tablet (800 mg total) by mouth every 8 (eight) hours as needed.  . OXYGEN Inhale 2 L into the lungs at bedtime.   [name] testosterone cypionate (DEPOTESTOSTERONE CYPIONATE) 200 MG/ML injection Inject 100 mg into  the muscle every 14 (fourteen) days.  [name] triamcinolone cream (KENALOG) 0.1 % Apply 1 application topically 3 (three) times daily. Apply to affected areas  . [DISCONTINUED] varenicline (CHANTIX STARTING MONTH PAK) 0.5 MG X 11 & 1 MG X 42 tablet Take 1 0.5 mg tablet once daily for 3 days, increase to 1 0.5 mg tablet twice daily for 4 days, increase to 1 1 mg tablet twice daily. (Patient not taking: Reported on 02/24/2019)   No facility-administered encounter medications on file as of 06/15/2019.    Physical Exam: Blood pressure 138/80, pulse 90, temperature 98.3 F (36.8 C), temperature source Oral, height 5\' 7"  (1.702 m), weight 254 lb 3.2 oz (115.3 kg), SpO2 95 %. Gen:      No acute distress HEENT:  EOMI, sclera anicteric Neck:     No masses; no thyromegaly Lungs:    Clear to auscultation bilaterally; normal respiratory effort CV:         Regular rate and rhythm; no murmurs Abd:      + bowel sounds; soft, non-tender; no palpable masses, no distension Ext:    No edema; adequate peripheral perfusion Skin:      Warm and dry; no rash Neuro: alert and oriented x 3 Psych: normal mood and affect  Data Reviewed: Imaging Chest x-ray 12/24/2016-mild perihilar infiltrate suggestive of pulmonary edema I have reviewed the images personally.  PFTs 01/16/16 FVC 2.43 [58%], FEV1 1.49 [45%), F/F 61, TLC 81%,DLCO  56% Severe obstruction, moderate reduction in diffusion capacity.  Sleep PSG 10/22/15  evere OSA, AHI 151. Started on an AutoSet CPAP titration study  Initiate CPAP at 12 with 2 L oxygen, fullface mask.  CPAP compliance report 06/23/16- 07/22/16 3% usage greater than 4 hours.  Labs CBC 08/24/17-absolute eosinophil count 254  Cardiac Echo (09/18/15) The right ventricular systolic pressure was increased consistent with moderate pulmonary hypertension.moderate concentric LVH. LVEF 60-65 percent. Grade 2 diastolic dysfunction. RV cavity size severe grade dilated. RV systolic function  moderately to severely reduced. PA pressure 56  Assessment:  Severe COPD Symptoms are stable on Anoro, Daliresp. Continue supplemental oxygen Work on weight loss with diet and exercise.  Check CBC differential and alpha-1 antitrypsin levels and phenotype  Severe OSA Stable on CPAP.  Continue with current setting of 12. Download reviewed with good compliance.  Tobacco use Smoking cessation discussed with patient Referral for low-dose screening CT of the chest.   Pulmonary hypertension Likely secondary to COPD, OSA.  We will continue to monitor.  Health maintenance 09/15/2017-influenza 09/15/2017- Prevnar 13 Given Pneumovax today  Plan/Recommendations: - Continue Anoro, Daliresp, supplemental oxygen - CBC alpha-1 antitrypsin - Continue CPAP - Screening CT of the chest - Pneumovax  Ricardo Garfinkel MD [facility] 06/15/2019, 2:25 PM  CC: Ricardo Albee, MD

## 2019-06-15 NOTE — Addendum Note (Signed)
Addended by: Hildred Alamin I on: 06/15/2019 04:42 PM   Modules accepted: Orders

## 2019-06-15 NOTE — Telephone Encounter (Signed)
It has been high in the past.  Likely related to smoking We will continue to monitor.

## 2019-06-17 LAB — NOVEL CORONAVIRUS, NAA: SARS-CoV-2, NAA: NOT DETECTED

## 2019-06-20 LAB — ALPHA-1 ANTITRYPSIN PHENOTYPE: A-1 Antitrypsin, Ser: 134 mg/dL (ref 83–199)

## 2019-06-21 ENCOUNTER — Telehealth: Payer: Self-pay | Admitting: Pulmonary Disease

## 2019-06-21 NOTE — Telephone Encounter (Signed)
Called and spoke with patient who saw his results on mychart. Explained to him Dr. Vaughan Browner replied on the 11th and we missed calling patient back per previous phone message from Dr. Vaughan Browner most likely due to smoking and wants to continue monitoring patient.   Patient verbalized understanding. Nothing further needed at this time.

## 2019-06-25 ENCOUNTER — Other Ambulatory Visit: Payer: Self-pay | Admitting: *Deleted

## 2019-06-25 DIAGNOSIS — F17210 Nicotine dependence, cigarettes, uncomplicated: Secondary | ICD-10-CM

## 2019-06-25 DIAGNOSIS — Z122 Encounter for screening for malignant neoplasm of respiratory organs: Secondary | ICD-10-CM

## 2019-06-25 DIAGNOSIS — Z87891 Personal history of nicotine dependence: Secondary | ICD-10-CM

## 2019-06-28 ENCOUNTER — Other Ambulatory Visit: Payer: Self-pay | Admitting: *Deleted

## 2019-06-28 DIAGNOSIS — Z20822 Contact with and (suspected) exposure to covid-19: Secondary | ICD-10-CM

## 2019-06-29 LAB — NOVEL CORONAVIRUS, NAA: SARS-CoV-2, NAA: NOT DETECTED

## 2019-07-06 ENCOUNTER — Other Ambulatory Visit (INDEPENDENT_AMBULATORY_CARE_PROVIDER_SITE_OTHER): Payer: Self-pay

## 2019-07-06 DIAGNOSIS — R5383 Other fatigue: Secondary | ICD-10-CM

## 2019-07-06 DIAGNOSIS — E291 Testicular hypofunction: Secondary | ICD-10-CM

## 2019-07-06 MED ORDER — TESTOSTERONE CYPIONATE 200 MG/ML IM SOLN
120.0000 mg | INTRAMUSCULAR | Status: DC
  Administered 2019-07-07 – 2019-09-08 (×5): 120 mg via INTRAMUSCULAR

## 2019-07-07 ENCOUNTER — Ambulatory Visit (INDEPENDENT_AMBULATORY_CARE_PROVIDER_SITE_OTHER): Payer: Medicare Other

## 2019-07-07 ENCOUNTER — Other Ambulatory Visit: Payer: Self-pay

## 2019-07-07 DIAGNOSIS — E291 Testicular hypofunction: Secondary | ICD-10-CM

## 2019-07-07 DIAGNOSIS — R5383 Other fatigue: Secondary | ICD-10-CM | POA: Diagnosis not present

## 2019-07-07 MED ORDER — TESTOSTERONE CYPIONATE 200 MG/ML IM SOLN
120.0000 mg | INTRAMUSCULAR | Status: DC
  Administered 2019-07-07 – 2019-09-15 (×11): 120 mg via INTRAMUSCULAR

## 2019-07-09 ENCOUNTER — Other Ambulatory Visit: Payer: Self-pay

## 2019-07-09 DIAGNOSIS — Z20822 Contact with and (suspected) exposure to covid-19: Secondary | ICD-10-CM

## 2019-07-11 LAB — NOVEL CORONAVIRUS, NAA: SARS-CoV-2, NAA: NOT DETECTED

## 2019-07-13 ENCOUNTER — Ambulatory Visit (INDEPENDENT_AMBULATORY_CARE_PROVIDER_SITE_OTHER): Payer: Medicare Other

## 2019-07-14 ENCOUNTER — Telehealth (INDEPENDENT_AMBULATORY_CARE_PROVIDER_SITE_OTHER): Payer: Self-pay | Admitting: Internal Medicine

## 2019-07-14 ENCOUNTER — Ambulatory Visit (INDEPENDENT_AMBULATORY_CARE_PROVIDER_SITE_OTHER): Payer: Medicare Other | Admitting: Acute Care

## 2019-07-14 ENCOUNTER — Ambulatory Visit (INDEPENDENT_AMBULATORY_CARE_PROVIDER_SITE_OTHER): Payer: Medicare Other

## 2019-07-14 ENCOUNTER — Encounter: Payer: Self-pay | Admitting: Acute Care

## 2019-07-14 ENCOUNTER — Other Ambulatory Visit: Payer: Self-pay

## 2019-07-14 ENCOUNTER — Ambulatory Visit
Admission: RE | Admit: 2019-07-14 | Discharge: 2019-07-14 | Disposition: A | Discharge status: Home / Self Care | Payer: Medicare Other | Source: Ambulatory Visit | Attending: Acute Care | Admitting: Acute Care

## 2019-07-14 VITALS — Resp 18 | Ht 67.0 in | Wt 255.0 lb

## 2019-07-14 VITALS — BP 164/110 | HR 90 | Ht 67.0 in | Wt 255.6 lb

## 2019-07-14 DIAGNOSIS — F17210 Nicotine dependence, cigarettes, uncomplicated: Secondary | ICD-10-CM

## 2019-07-14 DIAGNOSIS — Z122 Encounter for screening for malignant neoplasm of respiratory organs: Secondary | ICD-10-CM

## 2019-07-14 DIAGNOSIS — Z87891 Personal history of nicotine dependence: Secondary | ICD-10-CM

## 2019-07-14 DIAGNOSIS — R5383 Other fatigue: Secondary | ICD-10-CM

## 2019-07-14 DIAGNOSIS — E291 Testicular hypofunction: Secondary | ICD-10-CM

## 2019-07-14 NOTE — Telephone Encounter (Signed)
Called patient to inform him to take his BP medication and you will follow up on Sept 17th.

## 2019-07-14 NOTE — Progress Notes (Signed)
Pt given 0.81ml to the left thigh. Pt tolerated well;no complaints.

## 2019-07-14 NOTE — Progress Notes (Signed)
Shared Decision Making Visit Lung Cancer Screening Program [phone])   Eligibility:  Age 58 y.o.  Pack Years Smoking History Calculation 38 pack year smoking history (# packs/per year x # years smoked)  Recent History of coughing up blood  no  Unexplained weight loss? no ( >Than 15 pounds within the last 6 months )  Prior History Lung / other cancer no (Diagnosis within the last 5 years already requiring surveillance chest CT Scans).  Smoking Status Current Smoker  Former Smokers: Years since quit:NA  Quit Date: NA  Visit Components:  Discussion included one or more decision making aids. yes  Discussion included risk/benefits of screening. yes  Discussion included potential follow up diagnostic testing for abnormal scans. yes  Discussion included meaning and risk of over diagnosis. yes  Discussion included meaning and risk of False Positives. yes  Discussion included meaning of total radiation exposure. yes  Counseling Included:  Importance of adherence to annual lung cancer LDCT screening. yes  Impact of comorbidities on ability to participate in the program. yes  Ability and willingness to under diagnostic treatment. yes  Smoking Cessation Counseling:  Current Smokers:   Discussed importance of smoking cessation. yes  Information about tobacco cessation classes and interventions provided to patient. yes  Patient provided with "ticket" for LDCT Scan. yes  Symptomatic Patient. no  CounselingNA  Diagnosis Code: Tobacco Use Z72.0  Asymptomatic Patient yes  Counseling (Intermediate counseling: > three minutes counseling) [id number]  Former Smokers:   Discussed the importance of maintaining cigarette abstinence. yes  Diagnosis Code: Personal History of Nicotine Dependence. [id number]  Information about tobacco cessation classes and interventions provided to patient. Yes  Patient provided with "ticket" for LDCT Scan. yes  Written Order for Lung Cancer  Screening with LDCT placed in Epic. Yes (CT Chest Lung Cancer Screening Low Dose W/O CM) [id number] Z12.2-Screening of respiratory organs Z87.891-Personal history of nicotine dependence   I have spent 25 minutes of face to face time with Mr. Mor discussing the risks and benefits of lung cancer screening. We viewed a power point together that explained in detail the above noted topics. We paused at intervals to allow for questions to be asked and answered to ensure understanding.We discussed that the single most powerful action that he can take to decrease his risk of developing lung cancer is to quit smoking. We discussed whether or not he is ready to commit to setting a quit date. We discussed options for tools to aid in quitting smoking including nicotine replacement therapy, non-nicotine medications, support groups, Quit Smart classes, and behavior modification. We discussed that often times setting smaller, more achievable goals, such as eliminating 1 cigarette a day for a week and then 2 cigarettes a day for a week can be helpful in slowly decreasing the number of cigarettes smoked. This allows for a sense of accomplishment as well as providing a clinical benefit. I gave him the " Be Stronger Than Your Excuses" card with contact information for community resources, classes, free nicotine replacement therapy, and access to mobile apps, text messaging, and on-line smoking cessation help. I have also given him my card and contact information in the event he needs to contact me. We discussed the time and location of the scan, and that either Doroteo Glassman RN or I will call with the results within 24-48 hours of receiving them. I have offered him  a copy of the power point we viewed  as a resource in the event they need reinforcement of  the concepts we discussed today in the office. The patient verbalized understanding of all of  the above and had no further questions upon leaving the office. They have my  contact information in the event they have any further questions.  I spent 5 minutes counseling on smoking cessation and the health risks of continued tobacco abuse.  I explained to the patient that there has been a high incidence of coronary artery disease noted on these exams. I explained that this is a non-gated exam therefore degree or severity cannot be determined. This patient is on statin therapy. I have asked the patient to follow-up with their PCP regarding any incidental finding of coronary artery disease and management with diet or medication as their PCP  feels is clinically indicated. The patient verbalized understanding of the above and had no further questions upon completion of the visit.  Pt's blood pressure was elevated in the office today. He states he has not been taking his antihypertensive medications. We discussed the importance of taking his medication every day to prevent stroke and heart disease. I have asked him to follow up with his PCP.      Magdalen Spatz, NP 07/14/2019 10:27 AM

## 2019-07-14 NOTE — Patient Instructions (Signed)
Thank you for participating in the [facility]. It was our pleasure to meet you today. We will call you with the results of your scan within the next few days. Your scan will be assigned a Lung RADS category score by the physicians reading the scans.  This Lung RADS score determines follow up scanning.  See below for description of categories, and follow up screening recommendations. We will be in touch to schedule your follow up screening annually or based on recommendations of our providers. We will fax a copy of your scan results to your Primary Care Physician, or the physician who referred you to the program, to ensure they have the results. Please call the office if you have any questions or concerns regarding your scanning experience or results.  Our office number is [phone]. Please speak with Denise Phelps, RN. She is our Lung Cancer Screening RN. If she is unavailable when you call, please have the office staff send her a message. She will return your call at her earliest convenience. Remember, if your scan is normal, we will scan you annually as long as you continue to meet the criteria for the program. (Age 55-77, Current smoker or smoker who has quit within the last 15 years). If you are a smoker, remember, quitting is the single most powerful action that you can take to decrease your risk of lung cancer and other pulmonary, breathing related problems. We know quitting is hard, and we are here to help.  Please let us know if there is anything we can do to help you meet your goal of quitting. If you are a former smoker, congratulations. We are proud of you! Remain smoke free! Remember you can refer friends or family members through the number above.  We will screen them to make sure they meet criteria for the program. Thank you for helping us take better care of you by participating in Lung Screening.  Lung RADS Categories:  Lung RADS 1: no nodules  or definitely non-concerning nodules.  Recommendation is for a repeat annual scan in 12 months.  Lung RADS 2:  nodules that are non-concerning in appearance and behavior with a very low likelihood of becoming an active cancer. Recommendation is for a repeat annual scan in 12 months.  Lung RADS 3: nodules that are probably non-concerning , includes nodules with a low likelihood of becoming an active cancer.  Recommendation is for a 6-month repeat screening scan. Often noted after an upper respiratory illness. We will be in touch to make sure you have no questions, and to schedule your 6-month scan.  Lung RADS 4 A: nodules with concerning findings, recommendation is most often for a follow up scan in 3 months or additional testing based on our provider's assessment of the scan. We will be in touch to make sure you have no questions and to schedule the recommended 3 month follow up scan.  Lung RADS 4 B:  indicates findings that are concerning. We will be in touch with you to schedule additional diagnostic testing based on our provider's  assessment of the scan.   

## 2019-07-15 ENCOUNTER — Telehealth: Payer: Self-pay | Admitting: Acute Care

## 2019-07-15 DIAGNOSIS — Z122 Encounter for screening for malignant neoplasm of respiratory organs: Secondary | ICD-10-CM

## 2019-07-15 DIAGNOSIS — F17210 Nicotine dependence, cigarettes, uncomplicated: Secondary | ICD-10-CM

## 2019-07-16 NOTE — Telephone Encounter (Signed)
Pt informed of CT results per Sarah Groce, NP.  PT verbalized understanding.  Copy sent to PCP.  Order placed for 1 yr f/u CT.  

## 2019-07-20 ENCOUNTER — Other Ambulatory Visit: Payer: Self-pay

## 2019-07-20 ENCOUNTER — Ambulatory Visit (INDEPENDENT_AMBULATORY_CARE_PROVIDER_SITE_OTHER): Payer: Medicare Other

## 2019-07-20 DIAGNOSIS — Z20822 Contact with and (suspected) exposure to covid-19: Secondary | ICD-10-CM

## 2019-07-21 ENCOUNTER — Ambulatory Visit (INDEPENDENT_AMBULATORY_CARE_PROVIDER_SITE_OTHER): Payer: Medicare Other

## 2019-07-21 LAB — NOVEL CORONAVIRUS, NAA: SARS-CoV-2, NAA: NOT DETECTED

## 2019-07-22 ENCOUNTER — Other Ambulatory Visit: Payer: Self-pay

## 2019-07-22 ENCOUNTER — Ambulatory Visit (INDEPENDENT_AMBULATORY_CARE_PROVIDER_SITE_OTHER): Payer: Medicare Other | Admitting: Internal Medicine

## 2019-07-22 ENCOUNTER — Encounter (INDEPENDENT_AMBULATORY_CARE_PROVIDER_SITE_OTHER): Payer: Self-pay

## 2019-07-22 ENCOUNTER — Encounter (INDEPENDENT_AMBULATORY_CARE_PROVIDER_SITE_OTHER): Payer: Self-pay | Admitting: Internal Medicine

## 2019-07-22 ENCOUNTER — Ambulatory Visit (INDEPENDENT_AMBULATORY_CARE_PROVIDER_SITE_OTHER): Payer: Medicare Other

## 2019-07-22 VITALS — BP 140/88 | HR 72 | Ht 67.0 in | Wt 255.0 lb

## 2019-07-22 VITALS — Ht 67.0 in | Wt 255.0 lb

## 2019-07-22 DIAGNOSIS — E782 Mixed hyperlipidemia: Secondary | ICD-10-CM

## 2019-07-22 DIAGNOSIS — I10 Essential (primary) hypertension: Secondary | ICD-10-CM | POA: Diagnosis not present

## 2019-07-22 DIAGNOSIS — G44039 Episodic paroxysmal hemicrania, not intractable: Secondary | ICD-10-CM | POA: Diagnosis not present

## 2019-07-22 DIAGNOSIS — E291 Testicular hypofunction: Secondary | ICD-10-CM | POA: Diagnosis not present

## 2019-07-22 DIAGNOSIS — E559 Vitamin D deficiency, unspecified: Secondary | ICD-10-CM

## 2019-07-22 DIAGNOSIS — G441 Vascular headache, not elsewhere classified: Secondary | ICD-10-CM

## 2019-07-22 DIAGNOSIS — Z125 Encounter for screening for malignant neoplasm of prostate: Secondary | ICD-10-CM

## 2019-07-22 HISTORY — DX: Testicular hypofunction: E29.1

## 2019-07-22 MED ORDER — TESTOSTERONE CYPIONATE 200 MG/ML IM SOLN
120.0000 mg | INTRAMUSCULAR | Status: DC
  Administered 2019-07-28 – 2019-08-04 (×2): 120 mg via INTRAMUSCULAR

## 2019-07-22 MED ORDER — AMLODIPINE BESYLATE 10 MG PO TABS: 10.0000 mg | ORAL_TABLET | Freq: Every day | ORAL | 3 refills | Status: DC

## 2019-07-22 NOTE — Progress Notes (Signed)
Pt was given 0.6 in the right thigh. Tolerated well;no complaint.

## 2019-07-22 NOTE — Progress Notes (Addendum)
Wellness Office Visit  Subjective:  Patient ID: Ricardo Burch, male    DOB: [birth year]  Age: 58 y.o. MRN: [id number]  CC: This man comes in for follow-up of hypogonadism, hypertension, obesity. HPI  On his last visit, I increased his testosterone therapy to a dose of 0.6 mL once a week, that would be 120 mg testosterone once a week.  He has tolerated this well and feels that his energy levels are now consistently higher throughout the whole week compared to the waning that he used to get. He has been taking amlodipine 5 mg daily for his hypertension. He denies any chest pain, increased dyspnea or palpitations or new limb weakness. He is concerned about acute onset of headache that he got the last few weeks ago which was in the right occipital area.  He now tends to wake up almost on a daily basis with this headache, it does resolve once he gets about and moving around. Past Medical History:  Diagnosis Date  . Allergy   . CHF (congestive heart failure) ([place])   . Chronic kidney disease   . COPD (chronic obstructive pulmonary disease) ([place]) Dx 2015  . Depression   . Eczema    since childhood   . Hyperlipidemia 01/12/2018  . Hypertension   . Oxygen deficiency   . Sleep apnea    CPAP  . Substance abuse ([place])    MJ, cocaine  . Testicular failure 07/22/2019      Family History  Problem Relation Age of Onset  . Arthritis Mother   . Hypertension Mother   . Stroke Mother        age 37  . Cerebral aneurysm Father   . Alcohol abuse Father   . Cancer Brother   . Diabetes Neg Hx   . Heart disease Neg Hx     Social History   Social History Narrative    Lives alone.    5 daughters 28, 56 yo twins, eldest 2 are married live in [place].    Works part time in a car wash     Current Meds  Medication Sig  . albuterol (VENTOLIN HFA) 108 (90 Base) MCG/ACT inhaler INHALE 1 OR 2 PUFFS BY MOUTH EVERY 6 HOURS AS NEEDED FOR WHEEZING OR SHORTNESS OF BREATH  . amLODipine (NORVASC) 10  MG tablet Take 1 tablet (10 mg total) by mouth daily.  Jearl Klinefelter ELLIPTA 62.5-25 MCG/INH AEPB INHALE 1 PUFF BY MOUTH DAILY  . DALIRESP 500 MCG TABS tablet TAKE 1 TABLET BY MOUTH ONCE DAILY  . fluticasone (FLONASE) 50 MCG/ACT nasal spray INSTILL 2 SPRAYS IN EACH NOSTRIL TWICE DAILY  . OXYGEN Inhale 2 L into the lungs at bedtime.   [name] testosterone cypionate (DEPOTESTOSTERONE CYPIONATE) 200 MG/ML injection Inject 120 mg into the muscle once a week. 0.81ml/week  . [DISCONTINUED] amLODipine (NORVASC) 5 MG tablet Take 1 tablet (5 mg total) by mouth daily.   Current Facility-Administered Medications for the 07/22/19 encounter (Office Visit) with Doree Albee, MD  Medication  . testosterone cypionate (DEPOTESTOSTERONE CYPIONATE) injection 120 mg  . testosterone cypionate (DEPOTESTOSTERONE CYPIONATE) injection 120 mg  . testosterone cypionate (DEPOTESTOSTERONE CYPIONATE) injection 120 mg     Nutrition  He typically does intermittent fasting on a daily basis, at least 16 hours. Sleep  6 hours continuous sleep  Exercise  Walks every morning for 30 minutes Bio Identical Hormones  Testosterone therapy is being used off label for symptoms of testosterone deficiency and benefits that it  produces based on several studies.  These benefits include decreasing body fat, increasing in lean muscle mass and increasing in bone density.  There is improvement of memory, cognition.  There is improvement in exercise tolerance and endurance.  Testosterone therapy has also been shown to be protective against coronary artery disease, cerebrovascular disease, diabetes, hypertension and degenerative joint disease. I have discussed with the patient the FDA warnings regarding testosterone therapy, benefits and side effects and modes of administration as well as monitoring blood levels and side effects  on a regular basis The patient is agreeable that testosterone therapy should be an integral part of his/her  wellness,quality of life and prevention of chronic disease.  Objective:   Today's Vitals: BP 140/88   Pulse 72   Ht 5\' 7"  (1.702 m)   Wt 255 lb (115.7 kg)   BMI 39.94 kg/m  Vitals with BMI 07/22/2019 07/22/2019 07/14/2019  Height 5\' 7"  5\' 7"  5\' 7"   Weight 255 lbs 255 lbs 255 lbs 10 oz  BMI 39.93 123456 [id number]  Systolic [id number] - 123456  Diastolic 88 - [id number]  Pulse 72 - 90     Physical Exam  He looks systemically well and his blood pressure has improved but still not controlled.  He is alert and orientated without any focal neurological signs.     Assessment   1. Essential hypertension   2. Testicular failure   3. Mixed hyperlipidemia   4. Episodic paroxysmal hemicrania, not intractable   5. Special screening for malignant neoplasm of prostate   6. Other vascular headache   7. Vitamin D deficiency disease      Plan: 1. He will continue with all medications for his chronic conditions above, except that I am going to increase the amlodipine to 10 mg daily and I have sent a new prescription to reflect this.  In the meantime, he will take 2 tablets of the amlodipine 5 mg tablets. 2. Blood work is ordered as below. 3. I will arrange a CT scan of his brain to make sure there are no worrisome features as he did describe fairly sudden onset. 4. Further recommendations will depend on all these results and I will see him in about 3 months time for follow-up.  Tests ordered Orders Placed This Encounter  Procedures  . CT Head Wo Contrast  . Testosterone Total,Free,Bio, Males  . VITAMIN D 25 Hydroxy (Vit-D Deficiency, Fractures)  . PSA  . Lipid panel  . COMPLETE METABOLIC PANEL WITH GFR     Artesha Wemhoff Luther Parody, MD

## 2019-07-23 LAB — COMPLETE METABOLIC PANEL WITH GFR
AG Ratio: 1.4 (calc) (ref 1.0–2.5)
ALT: 14 U/L (ref 9–46)
AST: 17 U/L (ref 10–35)
Albumin: 4.2 g/dL (ref 3.6–5.1)
Alkaline phosphatase (APISO): 87 U/L (ref 35–144)
BUN: 10 mg/dL (ref 7–25)
CO2: 31 mmol/L (ref 20–32)
Calcium: 9.4 mg/dL (ref 8.6–10.3)
Chloride: 97 mmol/L — ABNORMAL LOW (ref 98–110)
Creat: 0.91 mg/dL (ref 0.70–1.33)
GFR, Est African American: 110 mL/min/{1.73_m2} (ref >=60)
GFR, Est Non African American: 95 mL/min/{1.73_m2} (ref >=60)
Globulin: 3.1 g/dL (calc) (ref 1.9–3.7)
Glucose, Bld: 78 mg/dL (ref 65–99)
Potassium: 4.7 mmol/L (ref 3.5–5.3)
Sodium: 137 mmol/L (ref 135–146)
Total Bilirubin: 1.4 mg/dL — ABNORMAL HIGH (ref 0.2–1.2)
Total Protein: 7.3 g/dL (ref 6.1–8.1)

## 2019-07-23 LAB — LIPID PANEL
Cholesterol: 122 mg/dL (ref <200)
HDL: 40 mg/dL (ref >=40)
LDL Cholesterol (Calc): 68 mg/dL (calc)
Non-HDL Cholesterol (Calc): 82 mg/dL (calc) (ref <130)
Total CHOL/HDL Ratio: 3.1 (calc) (ref <5.0)
Triglycerides: 67 mg/dL (ref <150)

## 2019-07-23 LAB — TESTOSTERONE TOTAL,FREE,BIO, MALES
Albumin: 4.2 g/dL (ref 3.6–5.1)
Sex Hormone Binding: 14 nmol/L (ref 10–50)
Testosterone, Bioavailable: 271.1 ng/dL (ref >=110.0)
Testosterone, Free: 140.8 pg/mL (ref 46.0–224.0)
Testosterone: 536 ng/dL (ref 250–827)

## 2019-07-23 LAB — VITAMIN D 25 HYDROXY (VIT D DEFICIENCY, FRACTURES): Vit D, 25-Hydroxy: 23 ng/mL — ABNORMAL LOW (ref 30–100)

## 2019-07-23 LAB — PSA: PSA: 0.4 ng/mL (ref <=4.0)

## 2019-07-23 NOTE — Progress Notes (Signed)
Your vitamin D levels are extremely low.  Please make sure you are taking vitamin D3 10,000 units daily.  Remaining blood work is unremarkable and very stable.  If you have any questions, do not hesitate to contact me through my chart.  Be well!

## 2019-07-26 ENCOUNTER — Other Ambulatory Visit: Payer: Self-pay | Admitting: Pulmonary Disease

## 2019-07-26 DIAGNOSIS — J438 Other emphysema: Secondary | ICD-10-CM

## 2019-07-27 ENCOUNTER — Ambulatory Visit (INDEPENDENT_AMBULATORY_CARE_PROVIDER_SITE_OTHER): Payer: Medicare Other

## 2019-07-28 ENCOUNTER — Other Ambulatory Visit: Payer: Self-pay

## 2019-07-28 ENCOUNTER — Ambulatory Visit (INDEPENDENT_AMBULATORY_CARE_PROVIDER_SITE_OTHER): Payer: Medicare Other

## 2019-07-28 VITALS — HR 100 | Resp 18 | Ht 67.0 in | Wt 254.0 lb

## 2019-07-28 DIAGNOSIS — E291 Testicular hypofunction: Secondary | ICD-10-CM

## 2019-07-28 NOTE — Progress Notes (Signed)
Pt given 0.6 ml to the right thigh. Pt tolerated well;no complaints.

## 2019-07-30 ENCOUNTER — Other Ambulatory Visit: Payer: Self-pay

## 2019-07-30 DIAGNOSIS — Z20822 Contact with and (suspected) exposure to covid-19: Secondary | ICD-10-CM

## 2019-07-31 LAB — NOVEL CORONAVIRUS, NAA: SARS-CoV-2, NAA: NOT DETECTED

## 2019-08-03 ENCOUNTER — Ambulatory Visit (HOSPITAL_COMMUNITY)
Admission: RE | Admit: 2019-08-03 | Discharge: 2019-08-03 | Disposition: A | Discharge status: Home / Self Care | Payer: Medicare Other | Source: Ambulatory Visit | Attending: Internal Medicine | Admitting: Internal Medicine

## 2019-08-03 ENCOUNTER — Ambulatory Visit (INDEPENDENT_AMBULATORY_CARE_PROVIDER_SITE_OTHER): Payer: Medicare Other

## 2019-08-03 ENCOUNTER — Other Ambulatory Visit: Payer: Self-pay

## 2019-08-03 DIAGNOSIS — G441 Vascular headache, not elsewhere classified: Secondary | ICD-10-CM | POA: Diagnosis present

## 2019-08-03 NOTE — Progress Notes (Signed)
Thankfully, your CT scan results are completely normal.

## 2019-08-04 ENCOUNTER — Ambulatory Visit (INDEPENDENT_AMBULATORY_CARE_PROVIDER_SITE_OTHER): Payer: Medicare Other

## 2019-08-04 DIAGNOSIS — E291 Testicular hypofunction: Secondary | ICD-10-CM

## 2019-08-05 ENCOUNTER — Telehealth (INDEPENDENT_AMBULATORY_CARE_PROVIDER_SITE_OTHER): Payer: Self-pay

## 2019-08-05 NOTE — Telephone Encounter (Signed)
It is possible that it is related to his amlodipine.  I will need to see him probably next week and examine him.  Please have him make an appointment.  Thanks.

## 2019-08-05 NOTE — Telephone Encounter (Signed)
Sure

## 2019-08-05 NOTE — Telephone Encounter (Signed)
Can you see him on the same time I do injection? We can change as  ov /nurse visit?

## 2019-08-10 ENCOUNTER — Ambulatory Visit (INDEPENDENT_AMBULATORY_CARE_PROVIDER_SITE_OTHER): Payer: Medicare Other

## 2019-08-10 ENCOUNTER — Other Ambulatory Visit: Payer: Self-pay

## 2019-08-10 DIAGNOSIS — Z20822 Contact with and (suspected) exposure to covid-19: Secondary | ICD-10-CM

## 2019-08-11 ENCOUNTER — Ambulatory Visit (INDEPENDENT_AMBULATORY_CARE_PROVIDER_SITE_OTHER): Payer: Medicare Other

## 2019-08-11 ENCOUNTER — Other Ambulatory Visit: Payer: Self-pay

## 2019-08-11 ENCOUNTER — Encounter (INDEPENDENT_AMBULATORY_CARE_PROVIDER_SITE_OTHER): Payer: Self-pay | Admitting: Internal Medicine

## 2019-08-11 ENCOUNTER — Ambulatory Visit (INDEPENDENT_AMBULATORY_CARE_PROVIDER_SITE_OTHER): Payer: Medicare Other | Admitting: Internal Medicine

## 2019-08-11 VITALS — BP 136/84 | HR 84 | Temp 97.8°F | Ht 67.0 in | Wt 256.0 lb

## 2019-08-11 VITALS — BP 136/84 | HR 84 | Ht 67.0 in | Wt 256.0 lb

## 2019-08-11 DIAGNOSIS — R600 Localized edema: Secondary | ICD-10-CM

## 2019-08-11 DIAGNOSIS — I10 Essential (primary) hypertension: Secondary | ICD-10-CM

## 2019-08-11 DIAGNOSIS — E291 Testicular hypofunction: Secondary | ICD-10-CM

## 2019-08-11 DIAGNOSIS — F524 Premature ejaculation: Secondary | ICD-10-CM

## 2019-08-11 DIAGNOSIS — R5383 Other fatigue: Secondary | ICD-10-CM | POA: Diagnosis not present

## 2019-08-11 MED ORDER — AMLODIPINE BESYLATE 5 MG PO TABS: 5.0000 mg | ORAL_TABLET | Freq: Every day | ORAL | 0 refills | Status: DC

## 2019-08-11 NOTE — Progress Notes (Signed)
Wellness Office Visit  Subjective:  Patient ID: Ricardo Burch, male    DOB: 1961/11/03  Age: 58 y.o. MRN: [id number]  CC: This man comes in as a walk-in because of bilateral leg edema. HPI  He also received his testosterone injection today.  He has been taking amlodipine 10 mg daily and we had switched him to a lower dose since he complained of bilateral leg edema.  Since starting the low-dose of amlodipine 5 mg daily, his leg edema has actually improved although still present. He denies any new dyspnea, chest pain or palpitations.  He does have a history of congestive heart failure and COPD in the past. Past Medical History:  Diagnosis Date  . Allergy   . CHF (congestive heart failure) ([place])   . Chronic kidney disease   . COPD (chronic obstructive pulmonary disease) ([place]) Dx 2015  . Depression   . Eczema    since childhood   . Hyperlipidemia 01/12/2018  . Hypertension   . Oxygen deficiency   . Sleep apnea    CPAP  . Substance abuse ([place])    MJ, cocaine  . Testicular failure 07/22/2019      Family History  Problem Relation Age of Onset  . Arthritis Mother   . Hypertension Mother   . Stroke Mother        age 22  . Cerebral aneurysm Father   . Alcohol abuse Father   . Cancer Brother   . Diabetes Neg Hx   . Heart disease Neg Hx     Social History   Social History Narrative    Lives alone.    5 daughters 25, 6 yo twins, eldest 2 are married live in [place].    Works part time in a car wash     Current Meds  Medication Sig  . albuterol (VENTOLIN HFA) 108 (90 Base) MCG/ACT inhaler INHALE 1 OR 2 PUFFS BY MOUTH EVERY 6 HOURS AS NEEDED FOR WHEEZING OR SHORTNESS OF BREATH  . amLODipine (NORVASC) 5 MG tablet Take 5 mg by mouth daily.  Jearl Klinefelter ELLIPTA 62.5-25 MCG/INH AEPB INHALE 1 PUFF BY MOUTH DAILY  . aspirin EC 81 MG tablet Take 1 tablet (81 mg total) by mouth daily.  [name] atorvastatin (LIPITOR) 40 MG tablet Take 40 mg by mouth every morning.   [name] DALIRESP 500 MCG  TABS tablet TAKE 1 TABLET BY MOUTH ONCE DAILY  . fluticasone (FLONASE) 50 MCG/ACT nasal spray INSTILL 2 SPRAYS IN EACH NOSTRIL TWICE DAILY  . OXYGEN Inhale 2 L into the lungs at bedtime.   [name] testosterone cypionate (DEPOTESTOSTERONE CYPIONATE) 200 MG/ML injection Inject 120 mg into the muscle once a week. 0.74ml/week  . [DISCONTINUED] amLODipine (NORVASC) 10 MG tablet Take 1 tablet (10 mg total) by mouth daily.  . [DISCONTINUED] ibuprofen (ADVIL,MOTRIN) 800 MG tablet Take 1 tablet (800 mg total) by mouth every 8 (eight) hours as needed.  . [DISCONTINUED] triamcinolone cream (KENALOG) 0.1 % Apply 1 application topically 3 (three) times daily. Apply to affected areas   Current Facility-Administered Medications for the 08/11/19 encounter (Office Visit) with Doree Albee, MD  Medication  . testosterone cypionate (DEPOTESTOSTERONE CYPIONATE) injection 120 mg  . testosterone cypionate (DEPOTESTOSTERONE CYPIONATE) injection 120 mg  . testosterone cypionate (DEPOTESTOSTERONE CYPIONATE) injection 120 mg       Objective:   Today's Vitals: BP 136/84   Pulse 84   Ht 5\' 7"  (1.702 m)   Wt 256 lb (116.1 kg)   BMI 40.10  kg/m  Vitals with BMI 08/11/2019 08/11/2019 07/28/2019  Height 5\' 7"  5\' 7"  5\' 7"   Weight 256 lbs 256 lbs 254 lbs  BMI 40.09 [id number] 0000000  Systolic XX123456 XX123456 -  Diastolic 84 84 -  Pulse 84 84 100     Physical Exam    He looks systemically well.  His blood pressure is reasonable for him based on a lower dose of amlodipine.  He is alert and orientated without any focal neurological signs.  He does have bilateral lower leg edema but it is not pitting and is not severe.  No clinical signs of heart failure presently.   Assessment   1. Bilateral leg edema   2. Essential hypertension       Tests ordered No orders of the defined types were placed in this encounter.    Plan: 1. I recommended that he continue with amlodipine 5 mg daily and I will send him refills of this  today.  If the bilateral leg edema gets worse, he will let me know.  He has no clinical signs of heart failure presently.     Doree Albee, MD

## 2019-08-12 ENCOUNTER — Other Ambulatory Visit: Payer: Self-pay

## 2019-08-12 ENCOUNTER — Encounter (HOSPITAL_COMMUNITY): Payer: Self-pay

## 2019-08-12 ENCOUNTER — Emergency Department (HOSPITAL_COMMUNITY)
Admission: EM | Admit: 2019-08-12 | Discharge: 2019-08-12 | Disposition: A | Discharge status: Home / Self Care | Payer: Medicare Other | Attending: Emergency Medicine | Admitting: Emergency Medicine

## 2019-08-12 DIAGNOSIS — M5489 Other dorsalgia: Secondary | ICD-10-CM

## 2019-08-12 DIAGNOSIS — N189 Chronic kidney disease, unspecified: Secondary | ICD-10-CM | POA: Insufficient documentation

## 2019-08-12 DIAGNOSIS — Z9981 Dependence on supplemental oxygen: Secondary | ICD-10-CM | POA: Insufficient documentation

## 2019-08-12 DIAGNOSIS — J449 Chronic obstructive pulmonary disease, unspecified: Secondary | ICD-10-CM | POA: Diagnosis not present

## 2019-08-12 DIAGNOSIS — I5032 Chronic diastolic (congestive) heart failure: Secondary | ICD-10-CM | POA: Diagnosis not present

## 2019-08-12 DIAGNOSIS — F17210 Nicotine dependence, cigarettes, uncomplicated: Secondary | ICD-10-CM | POA: Diagnosis not present

## 2019-08-12 DIAGNOSIS — M62830 Muscle spasm of back: Secondary | ICD-10-CM | POA: Diagnosis not present

## 2019-08-12 DIAGNOSIS — Z79899 Other long term (current) drug therapy: Secondary | ICD-10-CM | POA: Insufficient documentation

## 2019-08-12 DIAGNOSIS — Z7982 Long term (current) use of aspirin: Secondary | ICD-10-CM | POA: Diagnosis not present

## 2019-08-12 DIAGNOSIS — I130 Hypertensive heart and chronic kidney disease with heart failure and stage 1 through stage 4 chronic kidney disease, or unspecified chronic kidney disease: Secondary | ICD-10-CM | POA: Insufficient documentation

## 2019-08-12 DIAGNOSIS — M545 Low back pain: Secondary | ICD-10-CM | POA: Diagnosis present

## 2019-08-12 LAB — NOVEL CORONAVIRUS, NAA: SARS-CoV-2, NAA: NOT DETECTED

## 2019-08-12 MED ORDER — CYCLOBENZAPRINE HCL 10 MG PO TABS: 10.0000 mg | ORAL_TABLET | Freq: Three times a day (TID) | ORAL | 0 refills | Status: DC

## 2019-08-12 NOTE — Discharge Instructions (Addendum)
Your blood pressure was slightly elevated today, please have this rechecked soon.  The remainder of your vital signs are within normal limits.  Your examination favors muscle strain of your back, as well as some arthritis changes.  Please use a heating pad to your back.  Please use Tylenol and Flexeril with each meal.  You may benefit from checking into a water aerobics or aqua exercise program.  Please discuss this with your primary physician.  Flexeril may cause drowsiness.  Please do not drive a vehicle, operate machinery, drink alcohol, or participate in activities requiring concentration when taking this medication.  Please see your primary physician or return to the emergency department if any worsening of your symptoms, changes in your condition, problems or concerns.

## 2019-08-12 NOTE — ED Triage Notes (Addendum)
Pt reports that he changed the oil in his car yesterday and now hurting from back of neck to lower back. . Pt reports lower back pain is worse. Pt has a hx of neck and back pain. Pt says that it hurts worse early in the morning ,but only complaining of soreness . Normally wears O2 @ 2 L but tank in car low. Pt hooked up to wall O2 once roomed

## 2019-08-12 NOTE — ED Provider Notes (Signed)
[facility] EMERGENCY DEPARTMENT Provider Note   CSN: [id number] Arrival date & time: 08/12/19  1152     History   Chief Complaint Chief Complaint  Patient presents with  . Back Pain    HPI Ac Glade is a 58 y.o. male.     Patient is a 58 year old male who presents to the emergency department with a complaint of back pain.  The patient states that he has been hurting on his back off and on for approximately 2 weeks, but on yesterday he started having more intense pain.  This morning he had pain from his neck down to his lower back.  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  He also says that he was under a car and was ratcheting wrench underneath the car in an awkward position.  He has not had previous back surgery.  He is not had any recent fever or chills.  It is social history he acknowledges being a smoker, uses alcohol, but does not use street or illicit drugs.  Is been no reported fever or chills.  There is been no loss of bowel bladder function.  And there is been no report of numbness in the saddle areas.  The patient has tried ibuprofen, but states that he is gotten only minimal relief from the ibuprofen.  The history is provided by the patient.  Back Pain Associated symptoms: no abdominal pain, no chest pain, no dysuria, no numbness and no weakness     Past Medical History:  Diagnosis Date  . Allergy   . CHF (congestive heart failure) ([place])   . Chronic kidney disease   . COPD (chronic obstructive pulmonary disease) ([place]) Dx 2015  . Depression   . Eczema    since childhood   . Hyperlipidemia 01/12/2018  . Hypertension   . Oxygen deficiency   . Sleep apnea    CPAP  . Substance abuse ([place])    MJ, cocaine  . Testicular failure 07/22/2019    Patient Active Problem List   Diagnosis Date Noted  . Testicular failure 07/22/2019  . Nonspecific chest pain   . Chest pain 09/23/2018  . Chronic diastolic CHF (congestive heart failure)  ([place]) 09/23/2018  . Premature ejaculation 01/12/2018  . Hyperlipidemia 01/12/2018  . Hypertension 10/14/2017  . Aortic atherosclerosis ([place]) 09/15/2017  . Pre-diabetes 09/15/2017  . Cocaine abuse ([place]) 07/26/2016  . Depression 07/26/2016  . OSA on CPAP 07/26/2016  . CHF (congestive heart failure) ([place]) 06/27/2016  . Alcohol abuse 06/27/2016  . COPD (chronic obstructive pulmonary disease) ([place]) 08/23/2014  . Eczema 08/23/2014  . Right knee pain 08/23/2014  . Tobacco abuse 03/27/2014    Past Surgical History:  Procedure Laterality Date  . COLONOSCOPY WITH PROPOFOL N/A 06/25/2017   Procedure: COLONOSCOPY WITH PROPOFOL;  Surgeon: Daneil Dolin, MD;  [facility];  Service: Endoscopy;  Laterality: N/A;  2:00pm  . MULTIPLE EXTRACTIONS WITH ALVEOLOPLASTY N/A 03/22/2016   Procedure: MULTIPLE EXTRACTION WITH ALVEOLOPLASTY;  Surgeon: Diona Browner, DDS;  Location: [place];  Service: Oral Surgery;  Laterality: N/A;  . NO PAST SURGERIES    . POLYPECTOMY  06/25/2017   Procedure: POLYPECTOMY;  Surgeon: Daneil Dolin, MD;  Location: [facility];  Service: Endoscopy;;  colon        Home Medications    Prior to Admission medications   Medication Sig Start Date End Date Taking? Authorizing Provider  albuterol (VENTOLIN HFA) 108 (  90 Base) MCG/ACT inhaler INHALE 1 OR 2 PUFFS BY MOUTH EVERY 6 HOURS AS NEEDED FOR WHEEZING OR SHORTNESS OF BREATH 07/27/19   Mannam, Praveen, MD  amLODipine (NORVASC) 5 MG tablet Take 1 tablet (5 mg total) by mouth daily. 08/11/19   Gosrani, Nimish C, MD  ANORO ELLIPTA 62.5-25 MCG/INH AEPB INHALE 1 PUFF BY MOUTH DAILY 07/27/19   Mannam, Hart Robinsons, MD  aspirin EC 81 MG tablet Take 1 tablet (81 mg total) by mouth daily. 09/20/15   Florencia Reasons, MD  atorvastatin (LIPITOR) 40 MG tablet Take 40 mg by mouth every morning.  04/21/18   [provider]  DALIRESP 500 MCG TABS tablet TAKE 1 TABLET BY MOUTH ONCE DAILY 07/27/19   Mannam, Praveen, MD  fluticasone (FLONASE)  50 MCG/ACT nasal spray INSTILL 2 SPRAYS IN EACH NOSTRIL TWICE DAILY 07/27/19   Mannam, Praveen, MD  OXYGEN Inhale 2 L into the lungs at bedtime.     [provider]  testosterone cypionate (DEPOTESTOSTERONE CYPIONATE) 200 MG/ML injection Inject 120 mg into the muscle once a week. 0.70ml/week    [provider]    Family History Family History  Problem Relation Age of Onset  . Arthritis Mother   . Hypertension Mother   . Stroke Mother        age 4  . Cerebral aneurysm Father   . Alcohol abuse Father   . Cancer Brother   . Diabetes Neg Hx   . Heart disease Neg Hx     Social History Social History   Tobacco Use  . Smoking status: Current Some Day Smoker    Packs/day: 1.00    Years: 38.00    Pack years: 38.00    Types: Cigarettes    Last attempt to quit: 08/24/2018    Years since quitting: 0.9  . Smokeless tobacco: Never Used  Substance Use Topics  . Alcohol use: Yes    Alcohol/week: 1.0 standard drinks    Types: 1 Cans of beer per week    Comment: occassionally: weekends, 6-pack or less on a day; or half pint of liquior  . Drug use: Not Currently    Types: Marijuana, Cocaine    Comment: not current     Allergies   Apresoline [hydralazine]   Review of Systems Review of Systems  Constitutional: Negative for activity change and appetite change.  HENT: Negative for congestion, ear discharge, ear pain, facial swelling, nosebleeds, rhinorrhea, sneezing and tinnitus.   Eyes: Negative for photophobia, pain and discharge.  Respiratory: Negative for cough, choking, shortness of breath and wheezing.   Cardiovascular: Negative for chest pain, palpitations and leg swelling.  Gastrointestinal: Negative for abdominal pain, blood in stool, constipation, diarrhea, nausea and vomiting.  Genitourinary: Negative for difficulty urinating, dysuria, flank pain, frequency and hematuria.  Musculoskeletal: Positive for back pain. Negative for gait problem, myalgias and neck  pain.  Skin: Negative for color change, rash and wound.  Neurological: Negative for dizziness, seizures, syncope, facial asymmetry, speech difficulty, weakness and numbness.  Hematological: Negative for adenopathy. Does not bruise/bleed easily.  Psychiatric/Behavioral: Negative for agitation, confusion, hallucinations, self-injury and suicidal ideas. The patient is not nervous/anxious.      Physical Exam Updated Vital Signs BP (!) 141/91 (BP Location: Right Arm)   Pulse 91   Temp 98.6 F (37 C) (Oral)   Resp 18   Ht 5\' 7"  (1.702 m)   Wt 116 kg   SpO2 90%   BMI 40.05 kg/m   Physical Exam Vitals  signs and nursing note reviewed.  Constitutional:      Appearance: He is well-developed. He is not toxic-appearing.  HENT:     Head: Normocephalic.     Right Ear: Tympanic membrane and external ear normal.     Left Ear: Tympanic membrane and external ear normal.  Eyes:     General: Lids are normal.     Pupils: Pupils are equal, round, and reactive to light.  Neck:     Musculoskeletal: Normal range of motion and neck supple.     Vascular: No carotid bruit.  Cardiovascular:     Rate and Rhythm: Normal rate and regular rhythm.     Pulses: Normal pulses.     Heart sounds: Normal heart sounds.  Pulmonary:     Effort: No respiratory distress.     Breath sounds: Normal breath sounds.  Abdominal:     General: Bowel sounds are normal.     Palpations: Abdomen is soft.     Tenderness: There is no abdominal tenderness. There is no guarding.  Musculoskeletal: Normal range of motion.     Lumbar back: He exhibits tenderness and spasm.       Back:     Comments: No hot areas noted of the mid or lower back.  No palpable step-off of the thoracic or lumbar spine area.  Lymphadenopathy:     Head:     Right side of head: No submandibular adenopathy.     Left side of head: No submandibular adenopathy.     Cervical: No cervical adenopathy.  Skin:    General: Skin is warm and dry.   Neurological:     Mental Status: He is alert and oriented to person, place, and time.     Cranial Nerves: No cranial nerve deficit.     Sensory: No sensory deficit.  Psychiatric:        Speech: Speech normal.      ED Treatments / Results  Labs (all labs ordered are listed, but only abnormal results are displayed) Labs Reviewed - No data to display  EKG None  Radiology No results found.  Procedures Procedures (including critical care time)  Medications Ordered in ED Medications - No data to display   Initial Impression / Assessment and Plan / ED Course  I have reviewed the triage vital signs and the nursing notes.  Pertinent labs & imaging results that were available during my care of the patient were reviewed by me and considered in my medical decision making (see chart for details).          Final Clinical Impressions(s) / ED Diagnoses MDM  Blood pressure slightly elevated at 141/91.  Patient has a history of hypertension.  The vital signs are otherwise within normal limits.  The patient's pulse oximetry is 90% on room air.  It is of note that he uses home O2, and has a history of chronic obstructive pulmonary disease.  The lungs are clear at this time.  The patient has reproducible spasm with attempted range of motion of the mid to lower back.  There is no palpable step-off.  No recent injury.  And no focal neurologic deficits on examination.  The examination favors probable combination of muscle strain, as well as exacerbation of degenerative joint disease/arthritis.  Of asked the patient to use a heating pad to the area.  The patient has previously been using ibuprofen.  Given the patient's history of hypertension and age of asked him to reduce if not stop  the ibuprofen dosing.  I have asked him to use Tylenol with each meal.  Prescription for Flexeril will also be given to the patient with precautions that this may cause drowsiness, and or lightheadedness.  The  patient is asked to use caution when using this medication.  We have also suggested to the patient to look into a water aerobics or aqua exercise program, and to discuss this with his primary physician.  Patient is in agreement to this plan   Final diagnoses:  Muscle spasm of back  Back pain without sciatica    ED Discharge Orders         Ordered    cyclobenzaprine (FLEXERIL) 10 MG tablet  3 times daily     08/12/19 1338           Lily Kocher, PA-C 08/13/19 1253    Long, Wonda Olds, MD 08/13/19 [phone]

## 2019-08-17 ENCOUNTER — Ambulatory Visit (INDEPENDENT_AMBULATORY_CARE_PROVIDER_SITE_OTHER): Payer: Medicare Other

## 2019-08-18 ENCOUNTER — Encounter (INDEPENDENT_AMBULATORY_CARE_PROVIDER_SITE_OTHER): Payer: Self-pay | Admitting: Internal Medicine

## 2019-08-18 ENCOUNTER — Other Ambulatory Visit: Payer: Self-pay

## 2019-08-18 ENCOUNTER — Other Ambulatory Visit (INDEPENDENT_AMBULATORY_CARE_PROVIDER_SITE_OTHER): Payer: Self-pay | Admitting: Internal Medicine

## 2019-08-18 ENCOUNTER — Ambulatory Visit (INDEPENDENT_AMBULATORY_CARE_PROVIDER_SITE_OTHER): Payer: Medicare Other

## 2019-08-18 ENCOUNTER — Ambulatory Visit (INDEPENDENT_AMBULATORY_CARE_PROVIDER_SITE_OTHER): Payer: Medicare Other | Admitting: Internal Medicine

## 2019-08-18 ENCOUNTER — Telehealth: Payer: Self-pay | Admitting: Pulmonary Disease

## 2019-08-18 DIAGNOSIS — N529 Male erectile dysfunction, unspecified: Secondary | ICD-10-CM

## 2019-08-18 DIAGNOSIS — R5383 Other fatigue: Secondary | ICD-10-CM | POA: Diagnosis not present

## 2019-08-18 DIAGNOSIS — N5201 Erectile dysfunction due to arterial insufficiency: Secondary | ICD-10-CM

## 2019-08-18 DIAGNOSIS — E291 Testicular hypofunction: Secondary | ICD-10-CM

## 2019-08-18 HISTORY — DX: Male erectile dysfunction, unspecified: N52.9

## 2019-08-18 MED ORDER — TADALAFIL 20 MG PO TABS: 10.0000 mg | ORAL_TABLET | ORAL | 6 refills | Status: DC | PRN

## 2019-08-18 NOTE — Telephone Encounter (Signed)
Called and spoke w/ pt. Pt inquired if he had a record of a flu shot for this year. I let him know, according to our records, that his last flu vaccine was administered 06/29/2018. He states he thought he had a vaccine this year, which I explained to him may have been the pneumovax administered on 06/15/2019. Pt expressed understanding. He states he will follow-up with this PCP about this and receive his flu shot there. Nothing further needed at this time.

## 2019-08-18 NOTE — Progress Notes (Signed)
Wellness Office Visit  Subjective:  Patient ID: Ricardo Burch, male    DOB: [birth year]  Age: 58 y.o. MRN: [id number]  CC: This man came in to have his testosterone injection but also had concerns of erectile dysfunction for which he wanted to see me. HPI  He says that he has a new girlfriend of about 6 months and wants to start engaging in sexual relations.  He finds that his erection is not consistent every time and he wonders if he can get some help with this. He is also already taking testosterone therapy for decreased libido and low testosterone levels. Past Medical History:  Diagnosis Date  . Allergy   . CHF (congestive heart failure) ([place])   . Chronic kidney disease   . COPD (chronic obstructive pulmonary disease) ([place]) Dx 2015  . Depression   . Eczema    since childhood   . Erectile dysfunction 08/18/2019  . Hyperlipidemia 01/12/2018  . Hypertension   . Oxygen deficiency   . Sleep apnea    CPAP  . Substance abuse ([place])    MJ, cocaine  . Testicular failure 07/22/2019      Family History  Problem Relation Age of Onset  . Arthritis Mother   . Hypertension Mother   . Stroke Mother        age 70  . Cerebral aneurysm Father   . Alcohol abuse Father   . Cancer Brother   . Diabetes Neg Hx   . Heart disease Neg Hx     Social History   Social History Narrative    Lives alone.    5 daughters 66, 44 yo twins, eldest 2 are married live in [place].    Works part time in a car wash     Current Meds  Medication Sig  . albuterol (VENTOLIN HFA) 108 (90 Base) MCG/ACT inhaler INHALE 1 OR 2 PUFFS BY MOUTH EVERY 6 HOURS AS NEEDED FOR WHEEZING OR SHORTNESS OF BREATH  . amLODipine (NORVASC) 5 MG tablet Take 1 tablet (5 mg total) by mouth daily.  Jearl Klinefelter ELLIPTA 62.5-25 MCG/INH AEPB INHALE 1 PUFF BY MOUTH DAILY  . aspirin EC 81 MG tablet Take 1 tablet (81 mg total) by mouth daily.  [name] atorvastatin (LIPITOR) 40 MG tablet Take 40 mg by mouth every morning.   .  cyclobenzaprine (FLEXERIL) 10 MG tablet Take 1 tablet (10 mg total) by mouth 3 (three) times daily.  [name] DALIRESP 500 MCG TABS tablet TAKE 1 TABLET BY MOUTH ONCE DAILY  . fluticasone (FLONASE) 50 MCG/ACT nasal spray INSTILL 2 SPRAYS IN EACH NOSTRIL TWICE DAILY  . OXYGEN Inhale 2 L into the lungs at bedtime.   [name] testosterone cypionate (DEPOTESTOSTERONE CYPIONATE) 200 MG/ML injection Inject 120 mg into the muscle once a week. 0.52ml/week   Current Facility-Administered Medications for the 08/18/19 encounter (Office Visit) with Doree Albee, MD  Medication  . testosterone cypionate (DEPOTESTOSTERONE CYPIONATE) injection 120 mg  . testosterone cypionate (DEPOTESTOSTERONE CYPIONATE) injection 120 mg  . testosterone cypionate (DEPOTESTOSTERONE CYPIONATE) injection 120 mg       Objective:   Today's Vitals: BP 110/90   Pulse 72   Ht 5\' 7"  (1.702 m)   Wt 256 lb (116.1 kg)   BMI 40.10 kg/m  Vitals with BMI 08/18/2019 08/12/2019 08/11/2019  Height 5\' 7"  5\' 7"  5\' 7"   Weight 256 lbs 255 lbs 12 oz 256 lbs  BMI 40.09 123XX123 [id number]  Systolic [id number]  Diastolic 90 91  84  Pulse 72 91 84     Physical Exam  He looks systemically well.  No new physical findings.     Assessment   1. Erectile dysfunction due to arterial insufficiency       Tests ordered No orders of the defined types were placed in this encounter.    Plan: 1. I have sent a prescription for Cialis 10 mg as needed to see if this will help him.  He is not taking any nitrates.   No orders of the defined types were placed in this encounter.   Doree Albee, MD

## 2019-08-19 NOTE — Progress Notes (Signed)
testosterone shot was given.

## 2019-08-23 ENCOUNTER — Emergency Department (HOSPITAL_COMMUNITY): Payer: Medicare Other

## 2019-08-23 ENCOUNTER — Other Ambulatory Visit: Payer: Self-pay

## 2019-08-23 ENCOUNTER — Emergency Department (HOSPITAL_COMMUNITY)
Admission: EM | Admit: 2019-08-23 | Discharge: 2019-08-23 | Disposition: A | Discharge status: Home / Self Care | Payer: Medicare Other | Attending: Emergency Medicine | Admitting: Emergency Medicine

## 2019-08-23 ENCOUNTER — Encounter (HOSPITAL_COMMUNITY): Payer: Self-pay | Admitting: Emergency Medicine

## 2019-08-23 DIAGNOSIS — Z79899 Other long term (current) drug therapy: Secondary | ICD-10-CM | POA: Diagnosis not present

## 2019-08-23 DIAGNOSIS — M542 Cervicalgia: Secondary | ICD-10-CM | POA: Insufficient documentation

## 2019-08-23 DIAGNOSIS — M545 Low back pain, unspecified: Secondary | ICD-10-CM

## 2019-08-23 DIAGNOSIS — J449 Chronic obstructive pulmonary disease, unspecified: Secondary | ICD-10-CM | POA: Diagnosis not present

## 2019-08-23 DIAGNOSIS — I130 Hypertensive heart and chronic kidney disease with heart failure and stage 1 through stage 4 chronic kidney disease, or unspecified chronic kidney disease: Secondary | ICD-10-CM | POA: Diagnosis not present

## 2019-08-23 DIAGNOSIS — Z7982 Long term (current) use of aspirin: Secondary | ICD-10-CM | POA: Diagnosis not present

## 2019-08-23 DIAGNOSIS — N189 Chronic kidney disease, unspecified: Secondary | ICD-10-CM | POA: Insufficient documentation

## 2019-08-23 DIAGNOSIS — F17210 Nicotine dependence, cigarettes, uncomplicated: Secondary | ICD-10-CM | POA: Insufficient documentation

## 2019-08-23 DIAGNOSIS — I5032 Chronic diastolic (congestive) heart failure: Secondary | ICD-10-CM | POA: Insufficient documentation

## 2019-08-23 MED ORDER — CYCLOBENZAPRINE HCL 10 MG PO TABS: 10.0000 mg | ORAL_TABLET | Freq: Two times a day (BID) | ORAL | 0 refills | Status: AC | PRN

## 2019-08-23 MED ORDER — NAPROXEN 500 MG PO TABS: 500.0000 mg | ORAL_TABLET | Freq: Two times a day (BID) | ORAL | 0 refills | Status: AC

## 2019-08-23 MED ORDER — KETOROLAC TROMETHAMINE 30 MG/ML IJ SOLN: 30.0000 mg | Freq: Once | INTRAMUSCULAR | Status: DC

## 2019-08-23 MED ORDER — CYCLOBENZAPRINE HCL 10 MG PO TABS: 10.0000 mg | ORAL_TABLET | Freq: Two times a day (BID) | ORAL | 0 refills | Status: DC | PRN

## 2019-08-23 MED ORDER — NAPROXEN 500 MG PO TABS: 500.0000 mg | ORAL_TABLET | Freq: Two times a day (BID) | ORAL | 0 refills | Status: DC

## 2019-08-23 NOTE — ED Provider Notes (Signed)
The patient was seen and examined, he has been having pain in his neck and his lower back which seems to be worse when he gets up in the morning, at this point in the morning he presents with that complaint but states that the pain is almost completely gone away.  He denies any numbness or weakness of the arms or the legs.  He has been evaluated for this in the past and has been told to take Tylenol or ibuprofen which she takes occasionally and gets no relief.  He reports also having some difficulty with breathing in his apartment gets better when he goes outside, thinks it is related to mold.  On my exam the patient has a slight expiratory wheeze and has known COPD.  He is otherwise in no distress with vital signs which are reassuring and a neurologic exam which shows no weakness or numbness.  He has no pain in his neck or back on my exam and imaging has been reviewed both in the medical record and today cervical spine.  No acute findings.  Patient given reassurance, NSAIDs, muscle relaxants as needed, will have the patient follow-up closely in the outpatient setting.  Agreeable   Result Date: 08/23/2019 CLINICAL DATA:  Neck pain extending to the right side over the last 2 weeks. EXAM: CERVICAL SPINE - COMPLETE 4+ VIEW COMPARISON:  None. FINDINGS: Straightening of the normal cervical lordosis. Disc space narrowing throughout the cervical region. Uncovertebral osteophytes with foraminal encroachment at C3-4, C4-5 and C5-6 right more than left. No advanced facet arthropathy. IMPRESSION: Degenerative disc disease and uncovertebral osteophytes with foraminal encroachment at C3-4, C4-5 and C5-6 right more than left. Electronically Signed   By: Nelson Chimes M.D.   On: 08/23/2019 10:14   Ct Head Wo Contrast  Result Date: 08/03/2019 CLINICAL DATA:  Severe right-sided occipital headache, worst of life EXAM: CT HEAD WITHOUT CONTRAST TECHNIQUE: Contiguous axial images were obtained from the base of the skull through  the vertex without intravenous contrast. COMPARISON:  None. FINDINGS: Brain: No evidence of acute infarction, hemorrhage, hydrocephalus, extra-axial collection or mass lesion/mass effect. Vascular: No hyperdense vessel or unexpected calcification. Skull: Normal. Negative for fracture or focal lesion. Sinuses/Orbits: No acute finding. Other: None. IMPRESSION: No acute intracranial pathology. No non-contrast CT findings to explain headache. Electronically Signed   By: Eddie Candle M.D.   On: 08/03/2019 15:30     Medical screening examination/treatment/procedure(s) were conducted as a shared visit with non-physician practitioner(s) and myself.  I personally evaluated the patient during the encounter.  Clinical Impression:   Final diagnoses:  Neck pain  Acute right-sided low back pain without sciatica         Noemi Chapel, MD 08/24/19 [phone]

## 2019-08-23 NOTE — ED Triage Notes (Signed)
Pain in lower part of RT side of neck on and off for 2 weeks.  Neck feels stiff when he wakes up.  Feels better when he gets up and moves around

## 2019-08-23 NOTE — Discharge Instructions (Addendum)
As discussed, your landlord needs to look into the mold issue. Please call them today to start the process. I am sending you home with a muscle relaxer and pain/anti-inflammatory medicine. Please take as prescribed. As discussed, the muscle relaxer can make you drowsy, so do not drive or operate machinery. Follow-up with your PCP within the next week. Take over the counter allergy medicine. Return to the ER if you have new or worsening symptoms.

## 2019-08-23 NOTE — ED Provider Notes (Signed)
[facility] EMERGENCY DEPARTMENT Provider Note   CSN: [id number] Arrival date & time: 08/23/19  [id number]     History   Chief Complaint Chief Complaint  Patient presents with   Neck Pain    HPI Ricardo Burch is a 58 y.o. male with a past medical history significant for CHF, COPD, hyperlipidemia, HTN, emphysema on chronic oxygen therapy, and polysubstance abuse who presents to the ED due to intermittent right-sided neck and low back pain that has progressively gotten worse. Pain is worse in the morning and gets better throughout the day with usage. He has tried Tylenol with minimal relief. He was seen in the ER on 10/8 for the same complaint and treated symptomatically with ibuprofen and Tylenol. Patient also notes an increase in allergy symptoms of eye drainage, rhinorrhea, and headaches which he attributes to mold in his apartment. Patient denies IVDU. Patient denies numbness/tingling, weight loss, fever, chills, saddle paresthesias, weakness, and gait disturbances. Patient denies injury. Patient denies chest pain and shortness of breath.   Past Medical History:  Diagnosis Date   Allergy    CHF (congestive heart failure) (HCC)    Chronic kidney disease    COPD (chronic obstructive pulmonary disease) ([place]) Dx 2015   Depression    Eczema    since childhood    Erectile dysfunction 08/18/2019   Hyperlipidemia 01/12/2018   Hypertension    Oxygen deficiency    Sleep apnea    CPAP   Substance abuse ([place])    MJ, cocaine   Testicular failure 07/22/2019    Patient Active Problem List   Diagnosis Date Noted   Erectile dysfunction 08/18/2019   Testicular failure 07/22/2019   Nonspecific chest pain    Chest pain 09/23/2018   Chronic diastolic CHF (congestive heart failure) ([place]) 09/23/2018   Premature ejaculation 01/12/2018   Hyperlipidemia 01/12/2018   Hypertension 10/14/2017   Aortic atherosclerosis ([place]) 09/15/2017   Pre-diabetes 09/15/2017   Cocaine abuse  ([place]) 07/26/2016   Depression 07/26/2016   OSA on CPAP 07/26/2016   CHF (congestive heart failure) ([place]) 06/27/2016   Alcohol abuse 06/27/2016   COPD (chronic obstructive pulmonary disease) ([place]) 08/23/2014   Eczema 08/23/2014   Right knee pain 08/23/2014   Tobacco abuse 03/27/2014    Past Surgical History:  Procedure Laterality Date   COLONOSCOPY WITH PROPOFOL N/A 06/25/2017   Procedure: COLONOSCOPY WITH PROPOFOL;  Surgeon: Daneil Dolin, MD;  [facility];  Service: Endoscopy;  Laterality: N/A;  2:00pm   MULTIPLE EXTRACTIONS WITH ALVEOLOPLASTY N/A 03/22/2016   Procedure: MULTIPLE EXTRACTION WITH ALVEOLOPLASTY;  Surgeon: Diona Browner, DDS;  Location: [place];  Service: Oral Surgery;  Laterality: N/A;   NO PAST SURGERIES     POLYPECTOMY  06/25/2017   Procedure: POLYPECTOMY;  Surgeon: Daneil Dolin, MD;  Location: [facility];  Service: Endoscopy;;  colon        Home Medications    Prior to Admission medications   Medication Sig Start Date End Date Taking? Authorizing Provider  albuterol (VENTOLIN HFA) 108 (90 Base) MCG/ACT inhaler INHALE 1 OR 2 PUFFS BY MOUTH EVERY 6 HOURS AS NEEDED FOR WHEEZING OR SHORTNESS OF BREATH 07/27/19   Mannam, Praveen, MD  amLODipine (NORVASC) 5 MG tablet Take 1 tablet (5 mg total) by mouth daily. 08/11/19   Gosrani, Nimish C, MD  ANORO ELLIPTA 62.5-25 MCG/INH AEPB INHALE 1 PUFF BY MOUTH DAILY 07/27/19   Mannam, Hart Robinsons, MD  aspirin EC 81 MG tablet Take 1 tablet (81 mg  total) by mouth daily. 09/20/15   Florencia Reasons, MD  atorvastatin (LIPITOR) 40 MG tablet Take 40 mg by mouth every morning.  04/21/18   [provider]  cyclobenzaprine (FLEXERIL) 10 MG tablet Take 1 tablet (10 mg total) by mouth 3 (three) times daily. 08/12/19   Lily Kocher, PA-C  cyclobenzaprine (FLEXERIL) 10 MG tablet Take 1 tablet (10 mg total) by mouth 2 (two) times daily as needed for up to 5 days for muscle spasms. 08/23/19 08/28/19  Cheek, Song Myre B, PA-C    DALIRESP 500 MCG TABS tablet TAKE 1 TABLET BY MOUTH ONCE DAILY 07/27/19   Mannam, Praveen, MD  fluticasone (FLONASE) 50 MCG/ACT nasal spray INSTILL 2 SPRAYS IN EACH NOSTRIL TWICE DAILY 07/27/19   Mannam, Praveen, MD  naproxen (NAPROSYN) 500 MG tablet Take 1 tablet (500 mg total) by mouth 2 (two) times daily for 7 days. 08/23/19 08/30/19  Cheek, Comer Locket, PA-C  OXYGEN Inhale 2 L into the lungs at bedtime.     [provider]  tadalafil (CIALIS) 20 MG tablet Take 0.5 tablets (10 mg total) by mouth every other day as needed for erectile dysfunction. 08/18/19   Doree Albee, MD  testosterone cypionate (DEPOTESTOSTERONE CYPIONATE) 200 MG/ML injection Inject 120 mg into the muscle once a week. 0.93ml/week    [provider]    Family History Family History  Problem Relation Age of Onset   Arthritis Mother    Hypertension Mother    Stroke Mother        age 48   Cerebral aneurysm Father    Alcohol abuse Father    Cancer Brother    Diabetes Neg Hx    Heart disease Neg Hx     Social History Social History   Tobacco Use   Smoking status: Current Some Day Smoker    Packs/day: 1.00    Years: 38.00    Pack years: 38.00    Types: Cigarettes    Last attempt to quit: 08/24/2018    Years since quitting: 0.9   Smokeless tobacco: Never Used  Substance Use Topics   Alcohol use: Yes    Alcohol/week: 1.0 standard drinks    Types: 1 Cans of beer per week    Comment: occassionally: weekends, 6-pack or less on a day; or half pint of liquior   Drug use: Not Currently    Types: Marijuana, Cocaine    Comment: not current     Allergies   Apresoline [hydralazine]   Review of Systems Review of Systems  Constitutional: Negative for chills and fever.  HENT: Positive for rhinorrhea and sneezing.   Eyes: Positive for discharge.  Musculoskeletal: Positive for back pain, myalgias, neck pain and neck stiffness. Negative for gait problem.  Neurological: Negative for  weakness and numbness.  All other systems reviewed and are negative.    Physical Exam Updated Vital Signs BP (!) 162/108 (BP Location: Right Arm)    Pulse (!) 105    Temp 98.3 F (36.8 C) (Oral)    Resp 19    Ht 5\' 7"  (1.702 m)    Wt 114.8 kg    SpO2 90%    BMI 39.63 kg/m   Physical Exam Constitutional:      General: He is not in acute distress.    Appearance: He is not ill-appearing.  HENT:     Head: Normocephalic.  Eyes:     Pupils: Pupils are equal, round, and reactive to light.  Neck:  Musculoskeletal: Normal range of motion and neck supple. Muscular tenderness present.     Comments: No midline tenderness. Tenderness is right muscular region. Full ROM.  Cardiovascular:     Rate and Rhythm: Regular rhythm. Tachycardia present.     Pulses: Normal pulses.     Heart sounds: Normal heart sounds. No murmur. No friction rub. No gallop.   Pulmonary:     Effort: Pulmonary effort is normal.     Breath sounds: Normal breath sounds.  Abdominal:     General: Abdomen is flat. Bowel sounds are normal. There is no distension.     Palpations: Abdomen is soft.     Tenderness: There is no abdominal tenderness.  Musculoskeletal: Normal range of motion.     Comments: No midline tenderness is thoracic or lumbar region. TTP in right paraspinal lumbar muscles. Ambulates with no difficulties.   Skin:    General: Skin is warm and dry.  Neurological:     General: No focal deficit present.   Neurological:  Mental Status: Alert, oriented, thought content appropriate. Speech fluent without evidence of aphasia. Able to follow 2 step commands without difficulty.  Cranial Nerves:  III,IV, VI: Peripheral visual fields grossly normal, pupils equal, round, reactive to lightptosis not present, extra-ocular motions intact bilaterally  V,VII: smile symmetric, facial light touch sensation equal VIII: hearing grossly normal bilaterally  IX,X: midline uvula rise  XI: bilateral shoulder shrug equal and  strong XII: midline tongue extension  Motor:  5/5 in upper and lower extremities bilaterally including strong and equal grip strength and dorsiflexion/plantar flexion Sensory: light touch normal in all extremities.  Gait: normal gait and balance    ED Treatments / Results  Labs (all labs ordered are listed, but only abnormal results are displayed) Labs Reviewed - No data to display  EKG None  Radiology Dg Cervical Spine Complete  Result Date: 08/23/2019 CLINICAL DATA:  Neck pain extending to the right side over the last 2 weeks. EXAM: CERVICAL SPINE - COMPLETE 4+ VIEW COMPARISON:  None. FINDINGS: Straightening of the normal cervical lordosis. Disc space narrowing throughout the cervical region. Uncovertebral osteophytes with foraminal encroachment at C3-4, C4-5 and C5-6 right more than left. No advanced facet arthropathy. IMPRESSION: Degenerative disc disease and uncovertebral osteophytes with foraminal encroachment at C3-4, C4-5 and C5-6 right more than left. Electronically Signed   By: Nelson Chimes M.D.   On: 08/23/2019 10:14    Procedures Procedures (including critical care time)  Medications Ordered in ED Medications - No data to display   Initial Impression / Assessment and Plan / ED Course  I have reviewed the triage vital signs and the nursing notes.  Pertinent labs & imaging results that were available during my care of the patient were reviewed by me and considered in my medical decision making (see chart for details).       Eslie Orrell is a 58 year old male who presents to the ED for an evaluation of right-sided neck and low back pain. Patient was seen in the ED on 10/8 for similar complaint and treated symptomatically with Tylenol and ibuprofen. Upon arrival, patient tachycardic at 105 with elevated blood pressure of 162/108. Will continue to monitor. Patient is on chronic oxygen therapy of 2L nasal canula. On physical exam, patient is in no acute distress and non-ill  appearing. No tenderness to palpation in midline cervical, thoracic, and lumbar region. Tenderness in right muscular cervical region and right lumbar paraspinal region. Full neurological exam unremarkable. Ambulates without  difficulty. Patient had a lumbar film in 2018, but no other imaging. Will obtain cervical x-ray. Suspect muscular etiology vs. Degenerative disease. No concern for cauda equina.   Cervical x-ray demonstrated degenerative disease changes. Will treat symptomatically with Naproxen and Flexeril. Advised patient to follow-up with PCP within the next week if pain does not improve. Also, advised patient to take daily over the counter allergy medicine for his allergy symptoms. He has been instructed to have apartment complex check for mold. Strict ED return precautions discussed with patient. Patient states understanding and agrees to plan. Patient discharged home in no acute distress with vitals within normal limit except for mild elevated BP at 133/93. Patient advised to have BP rechecked with PCP.  Final Clinical Impressions(s) / ED Diagnoses   Final diagnoses:  Neck pain  Acute right-sided low back pain without sciatica    ED Discharge Orders         Ordered    cyclobenzaprine (FLEXERIL) 10 MG tablet  2 times daily PRN,   Status:  Discontinued     08/23/19 1030    naproxen (NAPROSYN) 500 MG tablet  2 times daily,   Status:  Discontinued     08/23/19 1030    cyclobenzaprine (FLEXERIL) 10 MG tablet  2 times daily PRN     08/23/19 1132    naproxen (NAPROSYN) 500 MG tablet  2 times daily     08/23/19 [address], PA-C 08/23/19 1253    Noemi Chapel, MD 08/24/19 [phone]

## 2019-08-24 ENCOUNTER — Other Ambulatory Visit: Payer: Self-pay

## 2019-08-24 ENCOUNTER — Ambulatory Visit (INDEPENDENT_AMBULATORY_CARE_PROVIDER_SITE_OTHER): Payer: Medicare Other

## 2019-08-24 DIAGNOSIS — Z20822 Contact with and (suspected) exposure to covid-19: Secondary | ICD-10-CM

## 2019-08-25 ENCOUNTER — Other Ambulatory Visit: Payer: Self-pay

## 2019-08-25 ENCOUNTER — Ambulatory Visit (INDEPENDENT_AMBULATORY_CARE_PROVIDER_SITE_OTHER): Payer: Medicare Other

## 2019-08-25 DIAGNOSIS — Z23 Encounter for immunization: Secondary | ICD-10-CM | POA: Diagnosis not present

## 2019-08-25 DIAGNOSIS — R5383 Other fatigue: Secondary | ICD-10-CM

## 2019-08-25 DIAGNOSIS — E291 Testicular hypofunction: Secondary | ICD-10-CM

## 2019-08-25 LAB — NOVEL CORONAVIRUS, NAA: SARS-CoV-2, NAA: NOT DETECTED

## 2019-08-25 NOTE — Progress Notes (Signed)
  Pt given 0.71ml to the left thigh. Pt tolerated.well

## 2019-08-31 ENCOUNTER — Ambulatory Visit (INDEPENDENT_AMBULATORY_CARE_PROVIDER_SITE_OTHER): Payer: Medicare Other

## 2019-09-01 ENCOUNTER — Other Ambulatory Visit: Payer: Self-pay

## 2019-09-01 ENCOUNTER — Ambulatory Visit (INDEPENDENT_AMBULATORY_CARE_PROVIDER_SITE_OTHER): Payer: Medicare Other

## 2019-09-01 DIAGNOSIS — R5383 Other fatigue: Secondary | ICD-10-CM | POA: Diagnosis not present

## 2019-09-01 DIAGNOSIS — E291 Testicular hypofunction: Secondary | ICD-10-CM | POA: Diagnosis not present

## 2019-09-01 DIAGNOSIS — N5201 Erectile dysfunction due to arterial insufficiency: Secondary | ICD-10-CM

## 2019-09-01 NOTE — Progress Notes (Signed)
Pt was given Testosterone 0.82ml to the Rt thigh. Pt tolerated well. Will be back next week for schedule routine injection.

## 2019-09-07 ENCOUNTER — Ambulatory Visit (INDEPENDENT_AMBULATORY_CARE_PROVIDER_SITE_OTHER): Payer: Medicare Other

## 2019-09-08 ENCOUNTER — Other Ambulatory Visit: Payer: Self-pay

## 2019-09-08 ENCOUNTER — Ambulatory Visit (INDEPENDENT_AMBULATORY_CARE_PROVIDER_SITE_OTHER): Payer: Medicare Other

## 2019-09-08 DIAGNOSIS — R5383 Other fatigue: Secondary | ICD-10-CM

## 2019-09-08 DIAGNOSIS — N5201 Erectile dysfunction due to arterial insufficiency: Secondary | ICD-10-CM

## 2019-09-08 DIAGNOSIS — E291 Testicular hypofunction: Secondary | ICD-10-CM

## 2019-09-14 ENCOUNTER — Other Ambulatory Visit: Payer: Self-pay | Admitting: *Deleted

## 2019-09-14 ENCOUNTER — Ambulatory Visit (INDEPENDENT_AMBULATORY_CARE_PROVIDER_SITE_OTHER): Payer: Medicare Other

## 2019-09-14 DIAGNOSIS — Z20822 Contact with and (suspected) exposure to covid-19: Secondary | ICD-10-CM

## 2019-09-15 ENCOUNTER — Other Ambulatory Visit (INDEPENDENT_AMBULATORY_CARE_PROVIDER_SITE_OTHER): Payer: Self-pay | Admitting: Internal Medicine

## 2019-09-15 ENCOUNTER — Encounter (INDEPENDENT_AMBULATORY_CARE_PROVIDER_SITE_OTHER): Payer: Self-pay

## 2019-09-15 ENCOUNTER — Telehealth (INDEPENDENT_AMBULATORY_CARE_PROVIDER_SITE_OTHER): Payer: Self-pay

## 2019-09-15 ENCOUNTER — Other Ambulatory Visit: Payer: Self-pay

## 2019-09-15 ENCOUNTER — Ambulatory Visit (INDEPENDENT_AMBULATORY_CARE_PROVIDER_SITE_OTHER): Payer: Medicare Other

## 2019-09-15 VITALS — Wt 265.0 lb

## 2019-09-15 DIAGNOSIS — R5383 Other fatigue: Secondary | ICD-10-CM

## 2019-09-15 DIAGNOSIS — E291 Testicular hypofunction: Secondary | ICD-10-CM | POA: Diagnosis not present

## 2019-09-15 DIAGNOSIS — N5201 Erectile dysfunction due to arterial insufficiency: Secondary | ICD-10-CM

## 2019-09-15 MED ORDER — TESTOSTERONE CYPIONATE 200 MG/ML IM SOLN: 120.0000 mg | INTRAMUSCULAR | 1 refills | Status: DC

## 2019-09-15 NOTE — Telephone Encounter (Signed)
Faxed orders to [facility] today.

## 2019-09-15 NOTE — Progress Notes (Signed)
Pt given IM 0.6 ml to rt thigh. Pt  Tolerated well.

## 2019-09-16 LAB — NOVEL CORONAVIRUS, NAA: SARS-CoV-2, NAA: NOT DETECTED

## 2019-09-21 ENCOUNTER — Ambulatory Visit (INDEPENDENT_AMBULATORY_CARE_PROVIDER_SITE_OTHER): Payer: Medicare Other

## 2019-09-22 ENCOUNTER — Ambulatory Visit (INDEPENDENT_AMBULATORY_CARE_PROVIDER_SITE_OTHER): Payer: Medicare Other

## 2019-09-22 ENCOUNTER — Other Ambulatory Visit: Payer: Self-pay

## 2019-09-22 DIAGNOSIS — N5201 Erectile dysfunction due to arterial insufficiency: Secondary | ICD-10-CM

## 2019-09-22 MED ORDER — TESTOSTERONE CYPIONATE 200 MG/ML IM SOLN
120.0000 mg | INTRAMUSCULAR | Status: DC
  Administered 2019-09-22 – 2019-10-13 (×3): 120 mg via INTRAMUSCULAR

## 2019-09-22 NOTE — Progress Notes (Signed)
PT GIVEN INJECTION TO RIGHT THIGH  DOSE 0.72mL.PT TOLERATED WELL;NO COMPLAINTS.

## 2019-09-28 ENCOUNTER — Other Ambulatory Visit (INDEPENDENT_AMBULATORY_CARE_PROVIDER_SITE_OTHER): Payer: Self-pay | Admitting: Internal Medicine

## 2019-09-28 ENCOUNTER — Ambulatory Visit (INDEPENDENT_AMBULATORY_CARE_PROVIDER_SITE_OTHER): Payer: Medicare Other

## 2019-09-28 ENCOUNTER — Other Ambulatory Visit: Payer: Self-pay

## 2019-09-28 ENCOUNTER — Other Ambulatory Visit (INDEPENDENT_AMBULATORY_CARE_PROVIDER_SITE_OTHER): Payer: Self-pay

## 2019-09-28 DIAGNOSIS — N5201 Erectile dysfunction due to arterial insufficiency: Secondary | ICD-10-CM

## 2019-09-28 MED ORDER — TADALAFIL 20 MG PO TABS: 10.0000 mg | ORAL_TABLET | ORAL | 6 refills | Status: DC | PRN

## 2019-09-28 MED ORDER — TADALAFIL 20 MG PO TABS: 10.0000 mg | ORAL_TABLET | Freq: Every day | ORAL | 2 refills | Status: DC | PRN

## 2019-09-28 NOTE — Telephone Encounter (Signed)
Waiting on [facility] for the notes to attach to referral.

## 2019-09-28 NOTE — Progress Notes (Signed)
Pt was given 0.6 mL to the to left thigh. Pt tolerated well. No complaints.

## 2019-09-28 NOTE — Telephone Encounter (Signed)
Disregard this message 

## 2019-09-29 ENCOUNTER — Ambulatory Visit (INDEPENDENT_AMBULATORY_CARE_PROVIDER_SITE_OTHER): Payer: Medicare Other

## 2019-10-05 ENCOUNTER — Other Ambulatory Visit: Payer: Self-pay

## 2019-10-05 ENCOUNTER — Ambulatory Visit (INDEPENDENT_AMBULATORY_CARE_PROVIDER_SITE_OTHER): Payer: Medicare Other

## 2019-10-05 DIAGNOSIS — Z20822 Contact with and (suspected) exposure to covid-19: Secondary | ICD-10-CM

## 2019-10-06 ENCOUNTER — Other Ambulatory Visit: Payer: Self-pay

## 2019-10-06 ENCOUNTER — Telehealth (INDEPENDENT_AMBULATORY_CARE_PROVIDER_SITE_OTHER): Payer: Self-pay | Admitting: Internal Medicine

## 2019-10-06 ENCOUNTER — Ambulatory Visit (INDEPENDENT_AMBULATORY_CARE_PROVIDER_SITE_OTHER): Payer: Medicare Other

## 2019-10-06 ENCOUNTER — Encounter (INDEPENDENT_AMBULATORY_CARE_PROVIDER_SITE_OTHER): Payer: Self-pay

## 2019-10-06 VITALS — HR 78 | Temp 98.3°F | Resp 18 | Ht 67.0 in | Wt 257.0 lb

## 2019-10-06 DIAGNOSIS — N5201 Erectile dysfunction due to arterial insufficiency: Secondary | ICD-10-CM

## 2019-10-06 DIAGNOSIS — F524 Premature ejaculation: Secondary | ICD-10-CM

## 2019-10-06 DIAGNOSIS — E291 Testicular hypofunction: Secondary | ICD-10-CM

## 2019-10-06 NOTE — Progress Notes (Signed)
Inject 0.57ml to the rt thigh. Pt was tolerated well; no complaints.

## 2019-10-07 LAB — NOVEL CORONAVIRUS, NAA: SARS-CoV-2, NAA: NOT DETECTED

## 2019-10-12 ENCOUNTER — Ambulatory Visit (INDEPENDENT_AMBULATORY_CARE_PROVIDER_SITE_OTHER): Payer: Medicare Other

## 2019-10-13 ENCOUNTER — Ambulatory Visit (INDEPENDENT_AMBULATORY_CARE_PROVIDER_SITE_OTHER): Payer: Medicare Other

## 2019-10-13 ENCOUNTER — Other Ambulatory Visit: Payer: Self-pay

## 2019-10-13 VITALS — Ht 67.0 in | Wt 257.0 lb

## 2019-10-13 DIAGNOSIS — N5201 Erectile dysfunction due to arterial insufficiency: Secondary | ICD-10-CM | POA: Diagnosis not present

## 2019-10-13 DIAGNOSIS — F524 Premature ejaculation: Secondary | ICD-10-CM

## 2019-10-13 DIAGNOSIS — E291 Testicular hypofunction: Secondary | ICD-10-CM

## 2019-10-13 NOTE — Progress Notes (Signed)
Pt given 0.13mL IM to the left thigh. Pt only had remaining bottle. Will need to bring more medication on next visit.

## 2019-10-14 ENCOUNTER — Encounter (INDEPENDENT_AMBULATORY_CARE_PROVIDER_SITE_OTHER): Payer: Self-pay | Admitting: Internal Medicine

## 2019-10-14 ENCOUNTER — Ambulatory Visit (INDEPENDENT_AMBULATORY_CARE_PROVIDER_SITE_OTHER): Payer: Medicare Other | Admitting: Internal Medicine

## 2019-10-14 VITALS — BP 140/89 | HR 84 | Temp 98.1°F | Ht 67.0 in | Wt 257.0 lb

## 2019-10-14 DIAGNOSIS — I10 Essential (primary) hypertension: Secondary | ICD-10-CM | POA: Diagnosis not present

## 2019-10-14 DIAGNOSIS — J438 Other emphysema: Secondary | ICD-10-CM

## 2019-10-14 DIAGNOSIS — R7303 Prediabetes: Secondary | ICD-10-CM

## 2019-10-14 DIAGNOSIS — M545 Low back pain, unspecified: Secondary | ICD-10-CM

## 2019-10-14 DIAGNOSIS — E291 Testicular hypofunction: Secondary | ICD-10-CM

## 2019-10-14 DIAGNOSIS — E782 Mixed hyperlipidemia: Secondary | ICD-10-CM

## 2019-10-14 DIAGNOSIS — N390 Urinary tract infection, site not specified: Secondary | ICD-10-CM

## 2019-10-14 NOTE — Progress Notes (Signed)
Metrics: Intervention Frequency ACO  Documented Smoking Status Yearly  Screened one or more times in 24 months  Cessation Counseling or  Active cessation medication Past 24 months  Past 24 months   Guideline developer: UpToDate (See UpToDate for funding source) Date Released: 2014       Wellness Office Visit  Subjective:  Patient ID: Ricardo Burch, male    DOB: 1961/04/17  Age: 59 y.o. MRN: [id number]  CC: This man comes in for follow-up of COPD, erectile dysfunction, hypertension, hyperlipidemia and sleep apnea.  HPI  He is concerned that testosterone therapy may be causing desaturation with lower oxygen levels.  Overall, testosterone therapy has made him feel better but more recently has noticed that injection of testosterone seems to be associated with decreased oxygen levels in his opinion. He is also complaining of right mid to lower back pain and he wonders what is causing this.  He denies any dysuria but he notices a dark discoloration of his urine despite drinking plenty of water. He continues with amlodipine for his hypertension.  He denies any chest pain or new limb weakness. He has vitamin D deficiency but has not been taking vitamin D 3 supplementation   Past Medical History:  Diagnosis Date  . Allergy   . CHF (congestive heart failure) ([place])   . Chronic kidney disease   . COPD (chronic obstructive pulmonary disease) ([place]) Dx 2015  . Depression   . Eczema    since childhood   . Erectile dysfunction 08/18/2019  . Hyperlipidemia 01/12/2018  . Hypertension   . Oxygen deficiency   . Sleep apnea    CPAP  . Substance abuse ([place])    MJ, cocaine  . Testicular failure 07/22/2019      Family History  Problem Relation Age of Onset  . Arthritis Mother   . Hypertension Mother   . Stroke Mother        age 50  . Cerebral aneurysm Father   . Alcohol abuse Father   . Cancer Brother   . Diabetes Neg Hx   . Heart disease Neg Hx     Social History   Social History  Narrative    Lives alone.    5 daughters 20, 52 yo twins, eldest 2 are married live in [place].    Works part time in a car wash   Social History   Tobacco Use  . Smoking status: Current Some Day Smoker    Packs/day: 1.00    Years: 38.00    Pack years: 38.00    Types: Cigarettes    Last attempt to quit: 08/24/2018    Years since quitting: 1.1  . Smokeless tobacco: Never Used  Substance Use Topics  . Alcohol use: Yes    Alcohol/week: 1.0 standard drinks    Types: 1 Cans of beer per week    Comment: occassionally: weekends, 6-pack or less on a day; or half pint of liquior    Current Meds  Medication Sig  . albuterol (VENTOLIN HFA) 108 (90 Base) MCG/ACT inhaler INHALE 1 OR 2 PUFFS BY MOUTH EVERY 6 HOURS AS NEEDED FOR WHEEZING OR SHORTNESS OF BREATH  . amLODipine (NORVASC) 5 MG tablet Take 1 tablet (5 mg total) by mouth daily.  Jearl Klinefelter ELLIPTA 62.5-25 MCG/INH AEPB INHALE 1 PUFF BY MOUTH DAILY  . aspirin EC 81 MG tablet Take 1 tablet (81 mg total) by mouth daily.  [name] DALIRESP 500 MCG TABS tablet TAKE 1 TABLET BY MOUTH ONCE DAILY  .  fluticasone (FLONASE) 50 MCG/ACT nasal spray INSTILL 2 SPRAYS IN EACH NOSTRIL TWICE DAILY  . OXYGEN Inhale 2 L into the lungs at bedtime.   . tadalafil (CIALIS) 20 MG tablet Take 0.5 tablets (10 mg total) by mouth daily as needed for erectile dysfunction.  [name] testosterone cypionate (DEPOTESTOSTERONE CYPIONATE) 200 MG/ML injection Inject 0.6 mLs (120 mg total) into the muscle once a week. 0.87ml/week  . [DISCONTINUED] atorvastatin (LIPITOR) 40 MG tablet Take 40 mg by mouth every morning.   . [DISCONTINUED] cyclobenzaprine (FLEXERIL) 10 MG tablet Take 1 tablet (10 mg total) by mouth 3 (three) times daily.       Objective:   Today's Vitals: BP 140/89 (BP Location: Left Arm, Patient Position: Sitting, Cuff Size: Normal)   Pulse 84   Temp 98.1 F (36.7 C) (Temporal)   Ht 5\' 7"  (1.702 m)   Wt 257 lb (116.6 kg)   SpO2 (!) 87% Comment: wearing a mask.   BMI 40.25 kg/m  Vitals with BMI 10/14/2019 10/13/2019 10/06/2019  Height 5\' 7"  5\' 7"  5\' 7"   Weight 257 lbs 257 lbs 257 lbs  BMI 40.24 [id number]  Systolic XX123456 - -  Diastolic 89 - -  Pulse 84 - 78     Physical Exam  He looks systemically well and his weight is largely unchanged.  His blood pressure is slightly elevated today for him.  His oxygen saturation is also somewhat lower today.  He is alert and orientated.  Examination of his back shows some point tenderness in the lower thoracic/upper lumbar right paramedian area.     Assessment   1. Essential hypertension   2. Other emphysema ([place])   3. Testicular failure   4. Mixed hyperlipidemia   5. Pre-diabetes   6. Acute right-sided low back pain without sciatica   7. Urinary tract infection without hematuria, site unspecified       Tests ordered Orders Placed This Encounter  Procedures  . Culture, Urine  . DG THORACOLUMABAR SPINE     Plan: 1. We will order a urine culture to see if he has any evidence of UTI. 2. I will send him for a thoracolumbar spine x-ray to make sure there are no major abnormalities here.  He is concerned about the possibility of a tumor of some sort. 3. I have told him he can discontinue testosterone therapy for the next 2 to 3 weeks and see how he feels in terms of oxygen levels although I do not see a good reason why testosterone therapy would cause desaturation of oxygen levels. 4. He will continue with antihypertensive therapy as before. 5. Further recommendations will depend on these results and he will come for an annual physical exam in about 3 months time.   No orders of the defined types were placed in this encounter.   Doree Albee, MD

## 2019-10-16 LAB — URINE CULTURE: Result:: NO GROWTH

## 2019-10-18 ENCOUNTER — Other Ambulatory Visit (INDEPENDENT_AMBULATORY_CARE_PROVIDER_SITE_OTHER): Payer: Self-pay | Admitting: Internal Medicine

## 2019-10-19 ENCOUNTER — Ambulatory Visit (INDEPENDENT_AMBULATORY_CARE_PROVIDER_SITE_OTHER): Payer: Medicare Other

## 2019-10-20 ENCOUNTER — Ambulatory Visit (INDEPENDENT_AMBULATORY_CARE_PROVIDER_SITE_OTHER): Payer: Medicare Other

## 2019-10-20 ENCOUNTER — Other Ambulatory Visit (INDEPENDENT_AMBULATORY_CARE_PROVIDER_SITE_OTHER): Payer: Self-pay

## 2019-10-21 ENCOUNTER — Other Ambulatory Visit: Payer: Self-pay

## 2019-10-21 ENCOUNTER — Ambulatory Visit (INDEPENDENT_AMBULATORY_CARE_PROVIDER_SITE_OTHER): Payer: Medicare Other

## 2019-10-21 VITALS — Ht 67.0 in | Wt 254.0 lb

## 2019-10-21 DIAGNOSIS — E291 Testicular hypofunction: Secondary | ICD-10-CM

## 2019-10-21 MED ORDER — TESTOSTERONE CYPIONATE 200 MG/ML IM SOLN: 120.0000 mg | INTRAMUSCULAR | Status: DC

## 2019-10-21 NOTE — Progress Notes (Signed)
0.55ml IM; to right thigh. Pt tolerated well ;no complaints

## 2019-10-26 ENCOUNTER — Ambulatory Visit (INDEPENDENT_AMBULATORY_CARE_PROVIDER_SITE_OTHER): Payer: Medicare Other

## 2019-10-26 ENCOUNTER — Other Ambulatory Visit: Payer: Self-pay

## 2019-10-26 ENCOUNTER — Ambulatory Visit: Payer: Medicare Other | Attending: Internal Medicine

## 2019-10-26 ENCOUNTER — Ambulatory Visit (INDEPENDENT_AMBULATORY_CARE_PROVIDER_SITE_OTHER): Payer: Medicare Other | Admitting: Internal Medicine

## 2019-10-26 DIAGNOSIS — Z20822 Contact with and (suspected) exposure to covid-19: Secondary | ICD-10-CM

## 2019-10-27 ENCOUNTER — Ambulatory Visit (INDEPENDENT_AMBULATORY_CARE_PROVIDER_SITE_OTHER): Payer: Medicare Other | Admitting: Internal Medicine

## 2019-10-27 ENCOUNTER — Ambulatory Visit (INDEPENDENT_AMBULATORY_CARE_PROVIDER_SITE_OTHER): Payer: Medicare Other

## 2019-10-27 ENCOUNTER — Encounter (INDEPENDENT_AMBULATORY_CARE_PROVIDER_SITE_OTHER): Payer: Self-pay | Admitting: Internal Medicine

## 2019-10-27 ENCOUNTER — Other Ambulatory Visit: Payer: Self-pay

## 2019-10-27 VITALS — BP 138/86 | HR 94 | Ht 67.0 in | Wt 254.0 lb

## 2019-10-27 DIAGNOSIS — J438 Other emphysema: Secondary | ICD-10-CM | POA: Diagnosis not present

## 2019-10-27 DIAGNOSIS — I10 Essential (primary) hypertension: Secondary | ICD-10-CM

## 2019-10-27 DIAGNOSIS — E291 Testicular hypofunction: Secondary | ICD-10-CM

## 2019-10-27 DIAGNOSIS — E559 Vitamin D deficiency, unspecified: Secondary | ICD-10-CM

## 2019-10-27 NOTE — Progress Notes (Signed)
Metrics: Intervention Frequency ACO  Documented Smoking Status Yearly  Screened one or more times in 24 months  Cessation Counseling or  Active cessation medication Past 24 months  Past 24 months   Guideline developer: UpToDate (See UpToDate for funding source) Date Released: 2014       Wellness Office Visit  Subjective:  Patient ID: Ricardo Burch, male    DOB: 11-26-1960  Age: 58 y.o. MRN: [id number]  CC: This man comes in for follow-up of hypertension, erectile dysfunction, hyperlipidemia, COPD, sleep apnea and hypogonadism. HPI  He is doing reasonably well.  He has a tendency to get COVID-19 testing done frequently because he is somewhat scared about getting the disease with asymptomatic state.  However, he has had no change in symptoms that he normally has with his COPD. He continues on testosterone therapy for his hypogonadism.  He is tolerating this. He continues on amlodipine for his hypertension.  He denies any chest pain or palpitations. He has vitamin D deficiency but unfortunately has not been taking vitamin D3 supplementation as I recommended previously. He does smoke cigarettes still but only 1 cigarette every so often now.  This is good progress. Past Medical History:  Diagnosis Date  . Allergy   . CHF (congestive heart failure) ([place])   . Chronic kidney disease   . COPD (chronic obstructive pulmonary disease) ([place]) Dx 2015  . Depression   . Eczema    since childhood   . Erectile dysfunction 08/18/2019  . Hyperlipidemia 01/12/2018  . Hypertension   . Oxygen deficiency   . Sleep apnea    CPAP  . Substance abuse ([place])    MJ, cocaine  . Testicular failure 07/22/2019      Family History  Problem Relation Age of Onset  . Arthritis Mother   . Hypertension Mother   . Stroke Mother        age 31  . Cerebral aneurysm Father   . Alcohol abuse Father   . Cancer Brother   . Diabetes Neg Hx   . Heart disease Neg Hx     Social History   Social History Narrative      Lives alone.    5 daughters 65, 48 yo twins, eldest 2 are married live in [place]. Retired.Girlfriend works in Armed forces logistics/support/administrative officer.   Social History   Tobacco Use  . Smoking status: Current Some Day Smoker    Packs/day: 1.00    Years: 38.00    Pack years: 38.00    Types: Cigarettes    Last attempt to quit: 08/24/2018    Years since quitting: 1.1  . Smokeless tobacco: Never Used  Substance Use Topics  . Alcohol use: Yes    Alcohol/week: 1.0 standard drinks    Types: 1 Cans of beer per week    Comment: occassionally: weekends, 6-pack or less on a day; or half pint of liquior    Current Meds  Medication Sig  . albuterol (VENTOLIN HFA) 108 (90 Base) MCG/ACT inhaler INHALE 1 OR 2 PUFFS BY MOUTH EVERY 6 HOURS AS NEEDED FOR WHEEZING OR SHORTNESS OF BREATH  . amLODipine (NORVASC) 5 MG tablet TAKE 1 TABLET BY MOUTH EVERY DAY  . ANORO ELLIPTA 62.5-25 MCG/INH AEPB INHALE 1 PUFF BY MOUTH DAILY  . aspirin EC 81 MG tablet Take 1 tablet (81 mg total) by mouth daily.  [name] DALIRESP 500 MCG TABS tablet TAKE 1 TABLET BY MOUTH ONCE DAILY  . fluticasone (FLONASE) 50 MCG/ACT nasal spray INSTILL 2 SPRAYS  IN EACH NOSTRIL TWICE DAILY  . OXYGEN Inhale 2 L into the lungs at bedtime.   . tadalafil (CIALIS) 20 MG tablet Take 0.5 tablets (10 mg total) by mouth daily as needed for erectile dysfunction.  [name] testosterone cypionate (DEPOTESTOSTERONE CYPIONATE) 200 MG/ML injection Inject 0.6 mLs (120 mg total) into the muscle once a week. 0.39ml/week      Bio Identical Hormones  Testosterone therapy is being used off label for symptoms of testosterone deficiency and benefits that it produces based on several studies.  These benefits include decreasing body fat, increasing in lean muscle mass and increasing in bone density.  There is improvement of memory, cognition.  There is improvement in exercise tolerance and endurance.  Testosterone therapy has also been shown to be protective against coronary artery disease,  cerebrovascular disease, diabetes, hypertension and degenerative joint disease. I have discussed with the patient the FDA warnings regarding testosterone therapy, benefits and side effects and modes of administration as well as monitoring blood levels and side effects  on a regular basis The patient is agreeable that testosterone therapy should be an integral part of his/her wellness,quality of life and prevention of chronic disease.  Objective:   Today's Vitals: BP 138/86   Pulse 94   Ht 5\' 7"  (1.702 m)   Wt 254 lb (115.2 kg)   SpO2 92%   BMI 39.78 kg/m  Vitals with BMI 10/27/2019 10/21/2019 10/14/2019  Height 5\' 7"  5\' 7"  5\' 7"   Weight 254 lbs 254 lbs 257 lbs  BMI 39.77 0000000 [id number]  Systolic 0000000 - XX123456  Diastolic 86 - 89  Pulse 94 - 84     Physical Exam  He looks much the same.  His weight is unchanged.  Blood pressure slightly elevated but better than last time.  No other new physical findings.     Assessment   1. Testicular failure   2. Essential hypertension   3. Vitamin D deficiency disease   4. Other emphysema ([place])       Tests ordered No orders of the defined types were placed in this encounter.    Plan: 1. He will continue with testosterone therapy as before and has been given an injection today in the office. 2. He will continue with antihypertensive therapy as before. 3. He needs to take vitamin D3 10,000 units daily and I have emphasized this today. 4. His COPD appears to be stable so we will continue with the same treatment for this. 5. Follow-up as scheduled in March of next year for an annual physical exam.   No orders of the defined types were placed in this encounter.   Doree Albee, MD

## 2019-10-27 NOTE — Progress Notes (Addendum)
Pt was given Weekly testosterone injection today. Pt was given 0.21ml;IM to the left thigh. Pt tolerated well. Pt stated he can know when his shot is wearing off. He gets tired.

## 2019-10-28 LAB — NOVEL CORONAVIRUS, NAA: SARS-CoV-2, NAA: NOT DETECTED

## 2019-11-02 ENCOUNTER — Ambulatory Visit (INDEPENDENT_AMBULATORY_CARE_PROVIDER_SITE_OTHER): Payer: Medicare Other

## 2019-11-03 ENCOUNTER — Other Ambulatory Visit: Payer: Self-pay

## 2019-11-03 ENCOUNTER — Ambulatory Visit (INDEPENDENT_AMBULATORY_CARE_PROVIDER_SITE_OTHER): Payer: Medicare Other

## 2019-11-03 VITALS — HR 101 | Temp 98.0°F | Ht 67.0 in | Wt 251.0 lb

## 2019-11-03 DIAGNOSIS — E291 Testicular hypofunction: Secondary | ICD-10-CM

## 2019-11-03 MED ORDER — TESTOSTERONE CYPIONATE 200 MG/ML IM SOLN
120.0000 mg | INTRAMUSCULAR | Status: DC
  Administered 2019-11-10 – 2020-03-08 (×18): 120 mg via INTRAMUSCULAR

## 2019-11-03 NOTE — Progress Notes (Signed)
Pt was given 0.59ml  IM to the left thigh. Pt tolerated well.

## 2019-11-05 ENCOUNTER — Other Ambulatory Visit: Payer: Self-pay

## 2019-11-05 ENCOUNTER — Encounter (HOSPITAL_COMMUNITY): Payer: Self-pay | Admitting: *Deleted

## 2019-11-05 ENCOUNTER — Emergency Department (HOSPITAL_COMMUNITY)
Admission: EM | Admit: 2019-11-05 | Discharge: 2019-11-05 | Disposition: A | Discharge status: Home / Self Care | Payer: Medicare Other | Attending: Emergency Medicine | Admitting: Emergency Medicine

## 2019-11-05 ENCOUNTER — Emergency Department (HOSPITAL_COMMUNITY): Payer: Medicare Other

## 2019-11-05 DIAGNOSIS — M47816 Spondylosis without myelopathy or radiculopathy, lumbar region: Secondary | ICD-10-CM | POA: Insufficient documentation

## 2019-11-05 DIAGNOSIS — Z79899 Other long term (current) drug therapy: Secondary | ICD-10-CM | POA: Diagnosis not present

## 2019-11-05 DIAGNOSIS — J449 Chronic obstructive pulmonary disease, unspecified: Secondary | ICD-10-CM | POA: Insufficient documentation

## 2019-11-05 DIAGNOSIS — I5032 Chronic diastolic (congestive) heart failure: Secondary | ICD-10-CM | POA: Diagnosis not present

## 2019-11-05 DIAGNOSIS — M47812 Spondylosis without myelopathy or radiculopathy, cervical region: Secondary | ICD-10-CM | POA: Diagnosis not present

## 2019-11-05 DIAGNOSIS — F17210 Nicotine dependence, cigarettes, uncomplicated: Secondary | ICD-10-CM | POA: Insufficient documentation

## 2019-11-05 DIAGNOSIS — Z7982 Long term (current) use of aspirin: Secondary | ICD-10-CM | POA: Diagnosis not present

## 2019-11-05 DIAGNOSIS — I110 Hypertensive heart disease with heart failure: Secondary | ICD-10-CM | POA: Insufficient documentation

## 2019-11-05 DIAGNOSIS — M545 Low back pain, unspecified: Secondary | ICD-10-CM

## 2019-11-05 DIAGNOSIS — R52 Pain, unspecified: Secondary | ICD-10-CM

## 2019-11-05 MED ORDER — NAPROXEN 500 MG PO TABS: 500.0000 mg | ORAL_TABLET | Freq: Two times a day (BID) | ORAL | 0 refills | Status: DC

## 2019-11-05 MED ORDER — NAPROXEN 250 MG PO TABS
500.0000 mg | ORAL_TABLET | Freq: Once | ORAL | Status: AC
  Administered 2019-11-05: 500 mg via ORAL
  Filled 2019-11-05: qty 2

## 2019-11-05 NOTE — ED Provider Notes (Addendum)
[facility] EMERGENCY DEPARTMENT Provider Note   CSN: [id number] Arrival date & time: 11/05/19  1325     History Chief Complaint  Patient presents with  . Groin Pain  . Headache  . Back Pain  . Neck Pain    Ricardo Burch is a 59 y.o. male.  HPI   This patient is a 59 year old male, he has a known history of congestive heart failure as well as COPD, he is on oxygen chronically.  In the last couple of months he has had some increasing lower back pain which seems to be more in the middle and right lower back and seems to radiate to the right groin.  Sometimes when he bends over he finds that he cannot stand back up because of the pain.  Today he woke up with increasing neck and shoulder pain as well and states this happens occasionally.  He denies numbness or weakness of the arms of the legs and denies any fever or prior spine surgery.  This pain at this time is mild to moderate.  He was told he could have x-rays of his back but has not yet been able to do this.  Past Medical History:  Diagnosis Date  . Allergy   . CHF (congestive heart failure) ([place])   . Chronic kidney disease   . COPD (chronic obstructive pulmonary disease) ([place]) Dx 2015  . Depression   . Eczema    since childhood   . Erectile dysfunction 08/18/2019  . Hyperlipidemia 01/12/2018  . Hypertension   . Oxygen deficiency   . Sleep apnea    CPAP  . Substance abuse ([place])    MJ, cocaine  . Testicular failure 07/22/2019    Patient Active Problem List   Diagnosis Date Noted  . Erectile dysfunction 08/18/2019  . Testicular failure 07/22/2019  . Nonspecific chest pain   . Chest pain 09/23/2018  . Chronic diastolic CHF (congestive heart failure) ([place]) 09/23/2018  . Premature ejaculation 01/12/2018  . Hyperlipidemia 01/12/2018  . Hypertension 10/14/2017  . Aortic atherosclerosis ([place]) 09/15/2017  . Pre-diabetes 09/15/2017  . Cocaine abuse ([place]) 07/26/2016  . Depression 07/26/2016  . OSA on CPAP 07/26/2016  . CHF  (congestive heart failure) ([place]) 06/27/2016  . Alcohol abuse 06/27/2016  . COPD (chronic obstructive pulmonary disease) ([place]) 08/23/2014  . Eczema 08/23/2014  . Right knee pain 08/23/2014  . Tobacco abuse 03/27/2014    Past Surgical History:  Procedure Laterality Date  . COLONOSCOPY WITH PROPOFOL N/A 06/25/2017   Procedure: COLONOSCOPY WITH PROPOFOL;  Surgeon: Daneil Dolin, MD;  [facility];  Service: Endoscopy;  Laterality: N/A;  2:00pm  . MULTIPLE EXTRACTIONS WITH ALVEOLOPLASTY N/A 03/22/2016   Procedure: MULTIPLE EXTRACTION WITH ALVEOLOPLASTY;  Surgeon: Diona Browner, DDS;  Location: [facility];  Service: Oral Surgery;  Laterality: N/A;  . NO PAST SURGERIES    . POLYPECTOMY  06/25/2017   Procedure: POLYPECTOMY;  Surgeon: Daneil Dolin, MD;  [facility];  Service: Endoscopy;;  colon       Family History  Problem Relation Age of Onset  . Arthritis Mother   . Hypertension Mother   . Stroke Mother        age 28  . Cerebral aneurysm Father   . Alcohol abuse Father   . Cancer Brother   . Diabetes Neg Hx   . Heart disease Neg Hx     Social History   Tobacco Use  . Smoking status: Current Some Day Smoker  Packs/day: 1.00    Years: 38.00    Pack years: 38.00    Types: Cigarettes    Last attempt to quit: 08/24/2018    Years since quitting: 1.2  . Smokeless tobacco: Never Used  Substance Use Topics  . Alcohol use: Yes    Alcohol/week: 1.0 standard drinks    Types: 1 Cans of beer per week    Comment: occassionally: weekends, 6-pack or less on a day; or half pint of liquior  . Drug use: Not Currently    Types: Marijuana, Cocaine    Comment: not current    Home Medications Prior to Admission medications   Medication Sig Start Date End Date Taking? Authorizing Provider  albuterol (VENTOLIN HFA) 108 (90 Base) MCG/ACT inhaler INHALE 1 OR 2 PUFFS BY MOUTH EVERY 6 HOURS AS NEEDED FOR WHEEZING OR SHORTNESS OF BREATH 07/27/19   Mannam, Praveen, MD    amLODipine (NORVASC) 5 MG tablet TAKE 1 TABLET BY MOUTH EVERY DAY 10/19/19   Gosrani, Nimish C, MD  ANORO ELLIPTA 62.5-25 MCG/INH AEPB INHALE 1 PUFF BY MOUTH DAILY 07/27/19   Mannam, Hart Robinsons, MD  aspirin EC 81 MG tablet Take 1 tablet (81 mg total) by mouth daily. 09/20/15   Florencia Reasons, MD  DALIRESP 500 MCG TABS tablet TAKE 1 TABLET BY MOUTH ONCE DAILY 07/27/19   Mannam, Praveen, MD  fluticasone (FLONASE) 50 MCG/ACT nasal spray INSTILL 2 SPRAYS IN EACH NOSTRIL TWICE DAILY 07/27/19   Mannam, Praveen, MD  naproxen (NAPROSYN) 500 MG tablet Take 1 tablet (500 mg total) by mouth 2 (two) times daily with a meal. 11/05/19   Noemi Chapel, MD  OXYGEN Inhale 2 L into the lungs at bedtime.     [provider]  tadalafil (CIALIS) 20 MG tablet Take 0.5 tablets (10 mg total) by mouth daily as needed for erectile dysfunction. 09/28/19   Doree Albee, MD  testosterone cypionate (DEPOTESTOSTERONE CYPIONATE) 200 MG/ML injection Inject 0.6 mLs (120 mg total) into the muscle once a week. 0.30ml/week 09/15/19   Doree Albee, MD  amLODipine (NORVASC) 5 MG tablet Take 1 tablet (5 mg total) by mouth daily. 08/11/19   Doree Albee, MD    Allergies    Apresoline [hydralazine]  Review of Systems   Review of Systems  Constitutional: Negative for fever.  Respiratory: Positive for shortness of breath ( Chronic).   Musculoskeletal: Positive for back pain, neck pain and neck stiffness. Negative for gait problem and joint swelling.  Skin: Negative for rash and wound.  Neurological: Negative for weakness and numbness.    Physical Exam Updated Vital Signs BP (!) 152/91 (BP Location: Right Arm)   Pulse (!) 102   Temp 98.4 F (36.9 C)   Resp 18   SpO2 91%   Physical Exam Constitutional:      General: He is not in acute distress.    Appearance: He is well-developed. He is not diaphoretic.  HENT:     Head: Normocephalic and atraumatic.  Eyes:     General: No scleral icterus.       Right eye: No  discharge.        Left eye: No discharge.     Conjunctiva/sclera: Conjunctivae normal.  Cardiovascular:     Rate and Rhythm: Normal rate and regular rhythm.  Pulmonary:     Effort: Pulmonary effort is normal.     Breath sounds: Wheezing present.     Comments: The patient wears oxygen chronically, he has a slight expiratory  wheeze but is able to speak in full sentences and has slightly diminished lung sounds, there is no rales, no accessory muscle use Genitourinary:    Comments: Normal groin exam without hernias tenderness or masses. Musculoskeletal:     Comments: Tenderness of the back over the right-sided paraspinal muscles and a Dambrosio amount of tenderness over the midline, there is no tenderness over the cervical spine No tenderness over the Cervical, Thoracic or Lumbar Spine There is tenderness in the bilateral trapezius and strap muscles of the neck  Skin:    General: Skin is warm and dry.     Findings: No rash.  Neurological:     Comments: Speech is clear, strength in the UE and LE's are normal at the major muscle groups including the hip, knee and ankles.  Sensation in tact to light touch and pin prick of the bilateral LE's.  Gait antalgic secondary to pain     ED Results / Procedures / Treatments   Labs (all labs ordered are listed, but only abnormal results are displayed) Labs Reviewed - No data to display  EKG None  Radiology DG Thoracic Spine 2 View  Result Date: 11/05/2019 CLINICAL DATA:  Pain for 2 weeks EXAM: THORACIC SPINE 2 VIEWS COMPARISON:  None. FINDINGS: There is no evidence of thoracic spine fracture. Alignment is normal. Degenerative endplate osteophyte formation throughout the thoracic spine. No other significant bone abnormalities are identified. IMPRESSION: Mild thoracic spine degenerative changes without acute osseous abnormality. Electronically Signed   By: Davina Poke D.O.   On: 11/05/2019 14:19   DG Lumbar Spine Complete  Result Date:  11/05/2019 CLINICAL DATA:  Back pain for 2 weeks EXAM: LUMBAR SPINE - COMPLETE 4+ VIEW COMPARISON:  08/24/2017 FINDINGS: Five lumbar type vertebral segments. Vertebral body heights and alignment are maintained. No fracture identified. Intervertebral disc spaces are relatively preserved. Mild degenerative endplate changes of [id number]-GRM. Mild lower lumbar facet arthrosis. Mild scattered atherosclerotic calcification of the abdominal aorta. IMPRESSION: 1. Mild degenerative changes of the lumbar spine, most pronounced at L4-5. Findings not significantly progressed from prior study. 2. No acute osseous abnormality. Electronically Signed   By: Davina Poke D.O.   On: 11/05/2019 14:21    Procedures Procedures (including critical care time)  Medications Ordered in ED Medications  naproxen (NAPROSYN) tablet 500 mg (has no administration in time range)    ED Course  I have reviewed the triage vital signs and the nursing notes.  Pertinent labs & imaging results that were available during my care of the patient were reviewed by me and considered in my medical decision making (see chart for details).    MDM Rules/Calculators/A&P                      There is no sign of hernia, the patient's pain is likely radicular radiating from his back to his groin, his neurologic exam is totally normal with the ability to straight leg raise walk and sensation.  X-rays will be obtained to rule out other causes of pain however the patient likely has some element of chronic pain secondary to obesity and age with arthritis.  X-rays read as unremarkable, I have personally viewed these and find there to be signs of arthritis but no signs of fractures dislocations or change from prior studies.  Patient is stable for discharge on an anti-inflammatory, he is agreeable to the plan  Final Clinical Impression(s) / ED Diagnoses Final diagnoses:  Right-sided low back pain without  sciatica, unspecified chronicity  Osteoarthritis  of cervical and lumbar spine    Rx / DC Orders ED Discharge Orders         Ordered    naproxen (NAPROSYN) 500 MG tablet  2 times daily with meals,   Status:  Discontinued     11/05/19 1427    naproxen (NAPROSYN) 500 MG tablet  2 times daily with meals     11/05/19 1434           Noemi Chapel, MD 11/05/19 1429    Noemi Chapel, MD 11/05/19 1435    Noemi Chapel, MD 11/05/19 1435

## 2019-11-05 NOTE — ED Triage Notes (Signed)
For the past 2 weeks patient has had back pain, neck pain groin pain and a headache, states he saw his doctor and was ordered and xray for his back and has not been able to have the xray.

## 2019-11-05 NOTE — Discharge Instructions (Signed)
Your x-rays show that you have a common condition called arthritis and degenerative disc and bone disease.  This will likely give you intermittent pain over time and should be treated with medicines such as Tylenol or ibuprofen.  Alternatively I have sent a prescription called naproxen to your pharmacy, you can take 1 tablet by mouth twice daily as needed for pain.  Please seek medical exam for increasing pain, fever, swelling, numbness, weakness or inability to walk, also seek a medical exam for difficulty with urination either incontinence or urinary retention.  Have your family doctor follow-up with you this week to go over your results and plan for future evaluations as you may need to see a spine specialist or orthopedist if this medicine does not help.

## 2019-11-10 ENCOUNTER — Ambulatory Visit (INDEPENDENT_AMBULATORY_CARE_PROVIDER_SITE_OTHER): Payer: Medicare Other

## 2019-11-10 ENCOUNTER — Other Ambulatory Visit: Payer: Self-pay

## 2019-11-10 DIAGNOSIS — E291 Testicular hypofunction: Secondary | ICD-10-CM | POA: Diagnosis not present

## 2019-11-10 DIAGNOSIS — N5201 Erectile dysfunction due to arterial insufficiency: Secondary | ICD-10-CM

## 2019-11-10 NOTE — Progress Notes (Signed)
Pt was given weekly dose of Testosterone in right thigh. Pt tolerated well no complaints.

## 2019-11-17 ENCOUNTER — Ambulatory Visit (INDEPENDENT_AMBULATORY_CARE_PROVIDER_SITE_OTHER): Payer: Medicare Other

## 2019-11-17 ENCOUNTER — Other Ambulatory Visit: Payer: Self-pay

## 2019-11-17 VITALS — Temp 97.6°F | Wt 250.0 lb

## 2019-11-17 DIAGNOSIS — N5201 Erectile dysfunction due to arterial insufficiency: Secondary | ICD-10-CM

## 2019-11-17 DIAGNOSIS — E291 Testicular hypofunction: Secondary | ICD-10-CM | POA: Diagnosis not present

## 2019-11-19 ENCOUNTER — Encounter (HOSPITAL_COMMUNITY): Payer: Self-pay

## 2019-11-19 ENCOUNTER — Other Ambulatory Visit: Payer: Self-pay

## 2019-11-19 DIAGNOSIS — M542 Cervicalgia: Secondary | ICD-10-CM | POA: Insufficient documentation

## 2019-11-19 DIAGNOSIS — J449 Chronic obstructive pulmonary disease, unspecified: Secondary | ICD-10-CM | POA: Diagnosis not present

## 2019-11-19 DIAGNOSIS — Z7982 Long term (current) use of aspirin: Secondary | ICD-10-CM | POA: Insufficient documentation

## 2019-11-19 DIAGNOSIS — F17210 Nicotine dependence, cigarettes, uncomplicated: Secondary | ICD-10-CM | POA: Insufficient documentation

## 2019-11-19 DIAGNOSIS — N183 Chronic kidney disease, stage 3 unspecified: Secondary | ICD-10-CM | POA: Insufficient documentation

## 2019-11-19 DIAGNOSIS — M545 Low back pain: Secondary | ICD-10-CM | POA: Insufficient documentation

## 2019-11-19 DIAGNOSIS — Z79899 Other long term (current) drug therapy: Secondary | ICD-10-CM | POA: Diagnosis not present

## 2019-11-19 DIAGNOSIS — M546 Pain in thoracic spine: Secondary | ICD-10-CM | POA: Diagnosis not present

## 2019-11-19 DIAGNOSIS — I130 Hypertensive heart and chronic kidney disease with heart failure and stage 1 through stage 4 chronic kidney disease, or unspecified chronic kidney disease: Secondary | ICD-10-CM | POA: Insufficient documentation

## 2019-11-19 DIAGNOSIS — I5032 Chronic diastolic (congestive) heart failure: Secondary | ICD-10-CM | POA: Insufficient documentation

## 2019-11-19 NOTE — ED Triage Notes (Signed)
Pt states he has been seen here before for same complaints of neck and back pain that goes down the right side of his spine.  Pt taking naproxen for same without relief.

## 2019-11-20 ENCOUNTER — Emergency Department (HOSPITAL_COMMUNITY)
Admission: EM | Admit: 2019-11-20 | Discharge: 2019-11-20 | Disposition: A | Discharge status: Home / Self Care | Payer: Medicare Other | Attending: Emergency Medicine | Admitting: Emergency Medicine

## 2019-11-20 DIAGNOSIS — M542 Cervicalgia: Secondary | ICD-10-CM

## 2019-11-20 DIAGNOSIS — M545 Low back pain, unspecified: Secondary | ICD-10-CM

## 2019-11-20 DIAGNOSIS — M549 Dorsalgia, unspecified: Secondary | ICD-10-CM

## 2019-11-20 LAB — CBG MONITORING, ED: Glucose-Capillary: 84 mg/dL (ref 70–99)

## 2019-11-20 MED ORDER — PREDNISONE 20 MG PO TABS: ORAL_TABLET | ORAL | 0 refills | Status: DC

## 2019-11-20 MED ORDER — DEXAMETHASONE SODIUM PHOSPHATE 10 MG/ML IJ SOLN
10.0000 mg | Freq: Once | INTRAMUSCULAR | Status: AC
  Administered 2019-11-20: 05:00:00 10 mg via INTRAMUSCULAR
  Filled 2019-11-20: qty 1

## 2019-11-20 MED ORDER — CYCLOBENZAPRINE HCL 5 MG PO TABS: 5.0000 mg | ORAL_TABLET | Freq: Three times a day (TID) | ORAL | 0 refills | Status: DC | PRN

## 2019-11-20 NOTE — ED Provider Notes (Signed)
[facility] EMERGENCY DEPARTMENT Provider Note   CSN: [id number] Arrival date & time: 11/19/19  2312   Time seen 4:06 AM  History Chief Complaint  Patient presents with  . Neck Pain  . Back Pain    Ricardo Burch is a 59 y.o. male.  HPI Patient states he has been having neck pain off and on and his back has been hurting off and on for the past several months.  He states he has been told he has arthritis however he does not believe he has arthritis, he thinks there is something else wrong "down inside".  He denies any numbness or tingling of extremities although he states sometimes when he wakes up he will have some tingling in his left hand or his right hand depending on how he was laying.  He states his neck pain is a pressure and sometimes in the morning his neck will be really stiff but it gets better as he starts moving around but he states it not helping as much as it used to.  Patient has been to the ED before for these episodes and he has had recent x-rays done.  Patient states he has alpha 1 antitrypsin deficiency  and he is concerned that that is what is causing his pain because he may have some liver involvement.  Patient also requesting an MRI, I explained to him I will not have MRI here at nights or over the weekend, he can discuss that with his primary care doctor.  Patient also concerned that his CPAP may be causing some of his problems.   PCP Doree Albee, MD     Past Medical History:  Diagnosis Date  . Allergy   . CHF (congestive heart failure) ([place])   . Chronic kidney disease   . COPD (chronic obstructive pulmonary disease) ([place]) Dx 2015  . Depression   . Eczema    since childhood   . Erectile dysfunction 08/18/2019  . Hyperlipidemia 01/12/2018  . Hypertension   . Oxygen deficiency   . Sleep apnea    CPAP  . Substance abuse ([place])    MJ, cocaine  . Testicular failure 07/22/2019    Patient Active Problem List   Diagnosis Date Noted  . Erectile dysfunction  08/18/2019  . Testicular failure 07/22/2019  . Nonspecific chest pain   . Chest pain 09/23/2018  . Chronic diastolic CHF (congestive heart failure) ([place]) 09/23/2018  . Premature ejaculation 01/12/2018  . Hyperlipidemia 01/12/2018  . Hypertension 10/14/2017  . Aortic atherosclerosis ([place]) 09/15/2017  . Pre-diabetes 09/15/2017  . Cocaine abuse ([place]) 07/26/2016  . Depression 07/26/2016  . OSA on CPAP 07/26/2016  . CHF (congestive heart failure) ([place]) 06/27/2016  . Alcohol abuse 06/27/2016  . COPD (chronic obstructive pulmonary disease) ([place]) 08/23/2014  . Eczema 08/23/2014  . Right knee pain 08/23/2014  . Tobacco abuse 03/27/2014    Past Surgical History:  Procedure Laterality Date  . COLONOSCOPY WITH PROPOFOL N/A 06/25/2017   Procedure: COLONOSCOPY WITH PROPOFOL;  Surgeon: Daneil Dolin, MD;  [facility];  Service: Endoscopy;  Laterality: N/A;  2:00pm  . MULTIPLE EXTRACTIONS WITH ALVEOLOPLASTY N/A 03/22/2016   Procedure: MULTIPLE EXTRACTION WITH ALVEOLOPLASTY;  Surgeon: Diona Browner, DDS;  Location: [facility];  Service: Oral Surgery;  Laterality: N/A;  . NO PAST SURGERIES    . POLYPECTOMY  06/25/2017   Procedure: POLYPECTOMY;  Surgeon: Daneil Dolin, MD;  [facility];  Service: Endoscopy;;  colon  Family History  Problem Relation Age of Onset  . Arthritis Mother   . Hypertension Mother   . Stroke Mother        age 35  . Cerebral aneurysm Father   . Alcohol abuse Father   . Cancer Brother   . Diabetes Neg Hx   . Heart disease Neg Hx     Social History   Tobacco Use  . Smoking status: Current Some Day Smoker    Packs/day: 1.00    Years: 38.00    Pack years: 38.00    Types: Cigarettes    Last attempt to quit: 08/24/2018    Years since quitting: 1.2  . Smokeless tobacco: Never Used  Substance Use Topics  . Alcohol use: Yes    Alcohol/week: 1.0 standard drinks    Types: 1 Cans of beer per week    Comment: occassionally: weekends, 6-pack  or less on a day; or half pint of liquior  . Drug use: Not Currently    Types: Marijuana, Cocaine    Comment: not current    Home Medications Prior to Admission medications   Medication Sig Start Date End Date Taking? Authorizing Provider  albuterol (VENTOLIN HFA) 108 (90 Base) MCG/ACT inhaler INHALE 1 OR 2 PUFFS BY MOUTH EVERY 6 HOURS AS NEEDED FOR WHEEZING OR SHORTNESS OF BREATH 07/27/19   Mannam, Praveen, MD  amLODipine (NORVASC) 5 MG tablet TAKE 1 TABLET BY MOUTH EVERY DAY 10/19/19   Gosrani, Nimish C, MD  ANORO ELLIPTA 62.5-25 MCG/INH AEPB INHALE 1 PUFF BY MOUTH DAILY 07/27/19   Mannam, Hart Robinsons, MD  aspirin EC 81 MG tablet Take 1 tablet (81 mg total) by mouth daily. 09/20/15   Florencia Reasons, MD  cyclobenzaprine (FLEXERIL) 5 MG tablet Take 1 tablet (5 mg total) by mouth 3 (three) times daily as needed. 11/20/19   Rolland Porter, MD  DALIRESP 500 MCG TABS tablet TAKE 1 TABLET BY MOUTH ONCE DAILY 07/27/19   Mannam, Praveen, MD  fluticasone (FLONASE) 50 MCG/ACT nasal spray INSTILL 2 SPRAYS IN EACH NOSTRIL TWICE DAILY 07/27/19   Mannam, Praveen, MD  naproxen (NAPROSYN) 500 MG tablet Take 1 tablet (500 mg total) by mouth 2 (two) times daily with a meal. 11/05/19   Noemi Chapel, MD  OXYGEN Inhale 2 L into the lungs at bedtime.     [provider]  predniSONE (DELTASONE) 20 MG tablet Take 3 po QD x 3d , then 2 po QD x 3d then 1 po QD x 3d 11/20/19   Rolland Porter, MD  tadalafil (CIALIS) 20 MG tablet Take 0.5 tablets (10 mg total) by mouth daily as needed for erectile dysfunction. 09/28/19   Doree Albee, MD  testosterone cypionate (DEPOTESTOSTERONE CYPIONATE) 200 MG/ML injection Inject 0.6 mLs (120 mg total) into the muscle once a week. 0.58ml/week 09/15/19   Doree Albee, MD  amLODipine (NORVASC) 5 MG tablet Take 1 tablet (5 mg total) by mouth daily. 08/11/19   Doree Albee, MD    Allergies    Apresoline [hydralazine]  Review of Systems   Review of Systems  All other systems reviewed  and are negative.   Physical Exam Updated Vital Signs BP 137/87 (BP Location: Right Arm)   Pulse 86   Temp 98.2 F (36.8 C) (Oral)   Resp 18   Ht 5\' 7"  (1.702 m)   Wt 113.4 kg   SpO2 95%   BMI 39.16 kg/m   Physical Exam Vitals and nursing note reviewed.  Constitutional:  Appearance: Normal appearance. He is obese.  HENT:     Head: Normocephalic and atraumatic.     Right Ear: External ear normal.     Left Ear: External ear normal.  Eyes:     Extraocular Movements: Extraocular movements intact.     Conjunctiva/sclera: Conjunctivae normal.     Pupils: Pupils are equal, round, and reactive to light.  Neck:     Comments: He indicates the pain is more on the right side of his neck but he states I cannot touch the pain. Cardiovascular:     Rate and Rhythm: Normal rate.  Pulmonary:     Effort: Pulmonary effort is normal. No respiratory distress.  Musculoskeletal:        General: No tenderness.     Cervical back: Normal range of motion and neck supple. No tenderness.     Comments: Patient has equal grips, he has intact motor finger interosseous muscles (T1), C5, and C7 without loss of sensation of the hand  Skin:    General: Skin is warm and dry.  Neurological:     General: No focal deficit present.     Mental Status: He is alert and oriented to person, place, and time.     Cranial Nerves: No cranial nerve deficit.  Psychiatric:        Mood and Affect: Mood normal.        Behavior: Behavior normal.        Thought Content: Thought content normal.     ED Results / Procedures / Treatments   Labs (all labs ordered are listed, but only abnormal results are displayed) Results for orders placed or performed during the hospital encounter of 11/20/19  CBG monitoring, ED  Result Value Ref Range   Glucose-Capillary 84 70 - 99 mg/dL   Comment 1 Notify RN    Comment 2 Document in Chart       EKG None  Radiology No results found.   Thoracic spine x-rays November 05, 2019 IMPRESSION: Mild thoracic spine degenerative changes without acute osseous abnormality.   Electronically Signed   By: Davina Poke D.O.   On: 11/05/2019 14:19  Lumbar spine x-rays November 05, 2019 IMPRESSION: 1. Mild degenerative changes of the lumbar spine, most pronounced at L4-5. Findings not significantly progressed from prior study. 2. No acute osseous abnormality.   Electronically Signed   By: Davina Poke D.O.   On: 11/05/2019 14:21   Lumbar spine x-rays August 23, 2019 IMPRESSION: Degenerative disc disease and uncovertebral osteophytes with foraminal encroachment at C3-4, C4-5 and C5-6 right more than left.   Electronically Signed   By: Nelson Chimes M.D.   On: 08/23/2019 10:14  Procedures Procedures (including critical care time)  Medications Ordered in ED Medications  dexamethasone (DECADRON) injection 10 mg (has no administration in time range)    ED Course  I have reviewed the triage vital signs and the nursing notes.  Pertinent labs & imaging results that were available during my care of the patient were reviewed by me and considered in my medical decision making (see chart for details).    MDM Rules/Calculators/A&P                        Patient CBG was normal.  He was given Decadron IM.  Patient was wanting MRI however is not available at night or over the weekends, this is something his primary care doctor can order.  He has no red  flags that would mean he needs an emergent MRI tonight.  I did refer him to some spine specialist.  Patient does not think he has arthritis.  He was given a prescription for prednisone and a muscle relaxer to take in addition to his naproxen.  He can use ice and heat for comfort.  Final Clinical Impression(s) / ED Diagnoses Final diagnoses:  Midline low back pain without sciatica, unspecified chronicity  Upper back pain  Neck pain    Rx / DC Orders ED Discharge Orders         Ordered     predniSONE (DELTASONE) 20 MG tablet     11/20/19 0440    cyclobenzaprine (FLEXERIL) 5 MG tablet  3 times daily PRN     11/20/19 0440        Continue naproxen  Plan discharge  Rolland Porter, MD, Barbette Or, MD 11/20/19 [phone]

## 2019-11-20 NOTE — ED Notes (Signed)
ED Provider at bedside. 

## 2019-11-20 NOTE — Discharge Instructions (Addendum)
Use ice and heat for comfort. Take the medication as prescribed, continue your naproxen. Consider seeing a spine specialist for a second opinion for you neck and back pain. I gave you 3 different providers you can follow up with.

## 2019-11-22 ENCOUNTER — Telehealth: Payer: Self-pay

## 2019-11-22 NOTE — Telephone Encounter (Signed)
PA request received from [facility]  Drug requested: Daliresp 517mcg CMM Key: [id number] Tried/failed: N/A Covered alternatives: N/A PA request has been sent to plan.   Routing to [facility] for follow-up.

## 2019-11-22 NOTE — Telephone Encounter (Signed)
Noted  

## 2019-11-24 ENCOUNTER — Ambulatory Visit (INDEPENDENT_AMBULATORY_CARE_PROVIDER_SITE_OTHER): Payer: Medicare Other

## 2019-11-25 ENCOUNTER — Other Ambulatory Visit: Payer: Self-pay

## 2019-11-25 ENCOUNTER — Ambulatory Visit (INDEPENDENT_AMBULATORY_CARE_PROVIDER_SITE_OTHER): Payer: Medicare Other

## 2019-11-25 DIAGNOSIS — E291 Testicular hypofunction: Secondary | ICD-10-CM

## 2019-11-25 DIAGNOSIS — N5201 Erectile dysfunction due to arterial insufficiency: Secondary | ICD-10-CM

## 2019-11-25 DIAGNOSIS — F524 Premature ejaculation: Secondary | ICD-10-CM

## 2019-11-25 NOTE — Progress Notes (Signed)
Pt given 0.6 to the left thigh. Pt tolerated well.

## 2019-11-26 NOTE — Telephone Encounter (Signed)
Medication name and strength: Daliresp 580mcg PA approved/denied: approved Approval dates: 11/23/2019-(didn't give end date) If denied, reason for denial: n/a  Pharmacy aware.  Nothing further needed at this time- will close encounter.

## 2019-11-29 ENCOUNTER — Ambulatory Visit: Payer: Medicare Other | Attending: Internal Medicine

## 2019-11-29 ENCOUNTER — Other Ambulatory Visit: Payer: Self-pay

## 2019-11-29 DIAGNOSIS — Z20822 Contact with and (suspected) exposure to covid-19: Secondary | ICD-10-CM

## 2019-11-30 LAB — NOVEL CORONAVIRUS, NAA: SARS-CoV-2, NAA: NOT DETECTED

## 2019-12-01 ENCOUNTER — Other Ambulatory Visit: Payer: Self-pay

## 2019-12-01 ENCOUNTER — Ambulatory Visit (INDEPENDENT_AMBULATORY_CARE_PROVIDER_SITE_OTHER): Payer: Medicare Other

## 2019-12-01 VITALS — BP 158/94 | HR 107 | Temp 97.5°F | Resp 19 | Ht 67.0 in | Wt 255.0 lb

## 2019-12-01 DIAGNOSIS — N5201 Erectile dysfunction due to arterial insufficiency: Secondary | ICD-10-CM

## 2019-12-01 DIAGNOSIS — E291 Testicular hypofunction: Secondary | ICD-10-CM

## 2019-12-01 NOTE — Progress Notes (Signed)
Pt given 0.27ml IM to the Rt thigh. Pt tolerated well.

## 2019-12-06 ENCOUNTER — Ambulatory Visit (INDEPENDENT_AMBULATORY_CARE_PROVIDER_SITE_OTHER): Payer: Medicare Other | Admitting: Pulmonary Disease

## 2019-12-06 ENCOUNTER — Other Ambulatory Visit: Payer: Self-pay

## 2019-12-06 ENCOUNTER — Encounter: Payer: Self-pay | Admitting: Pulmonary Disease

## 2019-12-06 DIAGNOSIS — J438 Other emphysema: Secondary | ICD-10-CM | POA: Diagnosis not present

## 2019-12-06 DIAGNOSIS — I700 Atherosclerosis of aorta: Secondary | ICD-10-CM | POA: Diagnosis not present

## 2019-12-06 DIAGNOSIS — I50810 Right heart failure, unspecified: Secondary | ICD-10-CM

## 2019-12-06 DIAGNOSIS — I2729 Other secondary pulmonary hypertension: Secondary | ICD-10-CM

## 2019-12-06 MED ORDER — IPRATROPIUM-ALBUTEROL 0.5-2.5 (3) MG/3ML IN SOLN: 3.0000 mL | RESPIRATORY_TRACT | 11 refills | Status: DC | PRN

## 2019-12-06 MED ORDER — BREZTRI AEROSPHERE 160-9-4.8 MCG/ACT IN AERO: 2.0000 | INHALATION_SPRAY | Freq: Two times a day (BID) | RESPIRATORY_TRACT | 3 refills | Status: DC

## 2019-12-06 MED ORDER — BREZTRI AEROSPHERE 160-9-4.8 MCG/ACT IN AERO: 2.0000 | INHALATION_SPRAY | Freq: Two times a day (BID) | RESPIRATORY_TRACT | 0 refills | Status: DC

## 2019-12-06 NOTE — Patient Instructions (Signed)
We will stop the Anoro and start you on a different medication called breztri as you still have significant dyspnea We will also give you nebulizer and duo nebs to be used up to 4 times a day as needed Congrats on quitting smoking Continue using the CPAP  Follow-up in 3 months.

## 2019-12-06 NOTE — Progress Notes (Signed)
Ricardo Burch    [id number]    1960-12-05  Primary Care Physician:Gosrani, Doristine Johns, MD  Referring Physician: Doree Albee, MD [address] [place],  [place] [address]  Chief complaint:   Follow up for Severe COPD Severe OSA on autoset Active smoker  HPI: Mr. Goings is a 59 year old active smoker, COPD. He's had multiple hospitalizations with COPD, CHF exacerbations in the past.He was hospitalized from 07/26/16-07/30/16 with acute pulmonary edema, acute exacerbation of COPD. He was intubated briefly for hypercarbia.   He has finished the pulmonary rehab and reports improved dyspnea. He has been on a rotating list of inhalers mainly due to his inability to pay co-pay. [name] He has been started on CPAP after a repeat titration study and is tolerating it well.  Interim History: Stable since last visit.  Continues on anoro, Daliresp Continues to smoke a couple of cigarettes a day. Compliant with CPAP.  Complains of chronic cough with chest congestion, dyspnea on exertion.  Outpatient Encounter Medications as of 12/06/2019  Medication Sig  . albuterol (VENTOLIN HFA) 108 (90 Base) MCG/ACT inhaler INHALE 1 OR 2 PUFFS BY MOUTH EVERY 6 HOURS AS NEEDED FOR WHEEZING OR SHORTNESS OF BREATH  . amLODipine (NORVASC) 5 MG tablet TAKE 1 TABLET BY MOUTH EVERY DAY  . ANORO ELLIPTA 62.5-25 MCG/INH AEPB INHALE 1 PUFF BY MOUTH DAILY  . aspirin EC 81 MG tablet Take 1 tablet (81 mg total) by mouth daily.  . cyclobenzaprine (FLEXERIL) 5 MG tablet Take 1 tablet (5 mg total) by mouth 3 (three) times daily as needed.  [name] DALIRESP 500 MCG TABS tablet TAKE 1 TABLET BY MOUTH ONCE DAILY  . fluticasone (FLONASE) 50 MCG/ACT nasal spray INSTILL 2 SPRAYS IN EACH NOSTRIL TWICE DAILY  . naproxen (NAPROSYN) 500 MG tablet Take 1 tablet (500 mg total) by mouth 2 (two) times daily with a meal.  . OXYGEN Inhale 2 L into the lungs at bedtime.   . tadalafil (CIALIS) 20 MG tablet Take 0.5 tablets (10 mg total) by mouth  daily as needed for erectile dysfunction.  [name] testosterone cypionate (DEPOTESTOSTERONE CYPIONATE) 200 MG/ML injection Inject 0.6 mLs (120 mg total) into the muscle once a week. 0.9ml/week  . [DISCONTINUED] predniSONE (DELTASONE) 20 MG tablet Take 3 po QD x 3d , then 2 po QD x 3d then 1 po QD x 3d  . [DISCONTINUED] amLODipine (NORVASC) 5 MG tablet Take 1 tablet (5 mg total) by mouth daily.   Facility-Administered Encounter Medications as of 12/06/2019  Medication  . testosterone cypionate (DEPOTESTOSTERONE CYPIONATE) injection 120 mg   Physical Exam: Blood pressure 138/80, pulse 90, temperature 98.3 F (36.8 C), temperature source Oral, height 5\' 7"  (1.702 m), weight 254 lb 3.2 oz (115.3 kg), SpO2 95 %. Gen:      No acute distress HEENT:  EOMI, sclera anicteric Neck:     No masses; no thyromegaly Lungs:    Clear to auscultation bilaterally; normal respiratory effort CV:         Regular rate and rhythm; no murmurs Abd:      + bowel sounds; soft, non-tender; no palpable masses, no distension Ext:    No edema; adequate peripheral perfusion Skin:      Warm and dry; no rash Neuro: alert and oriented x 3 Psych: normal mood and affect  Data Reviewed: Imaging Chest x-ray 12/24/2016-mild perihilar infiltrate suggestive of pulmonary edema I have reviewed the images personally.  PFTs 01/16/16 FVC 2.43 [  58%], FEV1 1.49 [45%), F/F 61, TLC 81%,DLCO 56% Severe obstruction, moderate reduction in diffusion capacity.  Sleep PSG 10/22/15  evere OSA, AHI 151. Started on an AutoSet CPAP titration study  Initiate CPAP at 12 with 2 L oxygen, fullface mask.  CPAP compliance report 06/23/16- 07/22/16 3% usage greater than 4 hours.  Labs CBC 08/24/17-absolute eosinophil count 254 Alpha 1 AT  Cardiac Echo (09/18/15) The right ventricular systolic pressure was increased consistent with moderate pulmonary hypertension.moderate concentric LVH. LVEF 60-65 percent. Grade 2 diastolic dysfunction. RV  cavity size severe grade dilated. RV systolic function moderately to severely reduced. PA pressure 56  Assessment:  Severe COPD Symptoms are stable on Anoro, Daliresp. Continue supplemental oxygen Finsihed Work on weight loss with diet and exercise.  Check CBC differential and alpha-1 antitrypsin levels and phenotype  Severe OSA Stable on CPAP.  Continue with current setting of 12. Download reviewed with good compliance.  Tobacco use Smoking cessation discussed with patient Referral for low-dose screening CT of the chest.   Pulmonary hypertension Likely secondary to COPD, OSA.  We will continue to monitor.  Health maintenance 09/15/2017-influenza 09/15/2017- Prevnar 13 Given Pneumovax today  Plan/Recommendations: - Continue Anoro, Daliresp, supplemental oxygen - CBC alpha-1 antitrypsin - Continue CPAP - Screening CT of the chest - Pneumovax  Marshell Garfinkel MD [facility] 12/06/2019, 10:19 AM  CC: Doree Albee, MD

## 2019-12-06 NOTE — Progress Notes (Signed)
Ricardo Ricardo    [id number]    25-Apr-1961  Primary Care Physician:Ricardo, Ricardo Johns, MD  Referring Physician: Doree Albee, MD [address] [place],  [place] [address]  Chief complaint:   Follow up for Severe COPD Severe OSA on autoset Active smoker  HPI: Ricardo Ricardo is a 59 year old active smoker, COPD. He's had multiple hospitalizations with COPD, CHF exacerbations in the past.He was hospitalized from 07/26/16-07/30/16 with acute pulmonary edema, acute exacerbation of COPD. He was intubated briefly for hypercarbia.   He has finished the pulmonary rehab and reports improved dyspnea. He has been on a rotating list of inhalers mainly due to his inability to pay co-pay. Ricardo Ricardo He has been started on CPAP after a repeat titration study and is tolerating it well.  Interim History: Quit smoking in December 2020.  Feels that he has a little more chest congestion since he quit smoking Has chronic cough with white mucus. Continues on anoro, Daliresp Continues to smoke a couple of cigarettes a day. Compliant with CPAP.  Outpatient Encounter Medications as of 12/06/2019  Medication Sig  . albuterol (VENTOLIN HFA) 108 (90 Base) MCG/ACT inhaler INHALE 1 OR 2 PUFFS BY MOUTH EVERY 6 HOURS AS NEEDED FOR WHEEZING OR SHORTNESS OF BREATH  . amLODipine (NORVASC) 5 MG tablet TAKE 1 TABLET BY MOUTH EVERY DAY  . ANORO ELLIPTA 62.5-25 MCG/INH AEPB INHALE 1 PUFF BY MOUTH DAILY  . aspirin EC 81 MG tablet Take 1 tablet (81 mg total) by mouth daily.  . cyclobenzaprine (FLEXERIL) 5 MG tablet Take 1 tablet (5 mg total) by mouth 3 (three) times daily as needed.  Ricardo Ricardo DALIRESP 500 MCG TABS tablet TAKE 1 TABLET BY MOUTH ONCE DAILY  . fluticasone (FLONASE) 50 MCG/ACT nasal spray INSTILL 2 SPRAYS IN EACH NOSTRIL TWICE DAILY  . naproxen (NAPROSYN) 500 MG tablet Take 1 tablet (500 mg total) by mouth 2 (two) times daily with a meal.  . OXYGEN Inhale 2 L into the lungs at bedtime.   . tadalafil (CIALIS) 20 MG  tablet Take 0.5 tablets (10 mg total) by mouth daily as needed for erectile dysfunction.  Ricardo Ricardo testosterone cypionate (DEPOTESTOSTERONE CYPIONATE) 200 MG/ML injection Inject 0.6 mLs (120 mg total) into the muscle once a week. 0.1ml/week  . [DISCONTINUED] predniSONE (DELTASONE) 20 MG tablet Take 3 po QD x 3d , then 2 po QD x 3d then 1 po QD x 3d  . [DISCONTINUED] amLODipine (NORVASC) 5 MG tablet Take 1 tablet (5 mg total) by mouth daily.   Facility-Administered Encounter Medications as of 12/06/2019  Medication  . testosterone cypionate (DEPOTESTOSTERONE CYPIONATE) injection 120 mg   Physical Exam: Blood pressure 132/80, pulse 100, temperature 98.2 F (36.8 C), temperature source Temporal, height 5\' 7"  (1.702 m), weight 249 lb 6.4 oz (113.1 kg), SpO2 (!) 88 %. Gen:      No acute distress Lungs:    Clear to auscultation bilaterally; normal respiratory effort Ext:    No edema; adequate peripheral perfusion Skin:      Warm and dry; no rash Neuro: alert and oriented x 3 Psych: normal mood and affect  Data Reviewed: Imaging Screening CT chest 07/14/2019-emphysema, subcentimeter pulmonary nodule.  Atelectasis along the right major fissure.  Have reviewed the images personally.  PFTs 01/16/16 FVC 2.43 [58%], FEV1 1.49 [45%), F/F 61, TLC 81%,DLCO 56% Severe obstruction, moderate reduction in diffusion capacity.  Sleep PSG 10/22/15  evere OSA, AHI 151. Started on an AutoSet CPAP  titration study  Initiate CPAP at 12 with 2 L oxygen, fullface mask.  CPAP compliance report 06/23/16- 07/22/16 3% usage greater than 4 hours.  Labs CBC 08/24/17-absolute eosinophil count 254 CBC 06/13/2019-WBC 12.7, eos 1%, absolute eosinophil count 127 Alpha-1 antitrypsin 06/15/2019-134, PI MM  Cardiac Echo (09/18/15) The right ventricular systolic pressure was increased consistent with moderate pulmonary hypertension.moderate concentric LVH. LVEF 60-65 percent. Grade 2 diastolic dysfunction. RV cavity size severe  grade dilated. RV systolic function moderately to severely reduced. PA pressure 56  Assessment:  Severe COPD Continues on Daliresp.  He feels that the Anoro is not working as well for him anymore We will try alternate inhaler therapy with breztri Continue supplemental oxygen Work on weight loss with diet and exercise. He is finished pulmonary rehab in the past.  We can refer him again once Covid restrictions are removed.  Severe OSA Stable on CPAP.  Continue with current setting of 12. Encouraged him to use it every day.  Tobacco use Congratulated on quitting smoking. Continue low-dose screening CTs   Pulmonary hypertension Likely group 3 pulmonary hypertension secondary to COPD, OSA.  Continue to monitor.  Health maintenance 09/15/2017- Prevnar 13 06/14/2019-Pneumovax  This appointment required 40 minutes of patient care (this includes precharting, chart review, review of results, face-to-face care, etc.). Plan/Recommendations: - Stop Anoro.  Start Daliresp - Continue Daliresp, supplemental oxygen - Continue CPAP - Screening CT of the chest  Ricardo Garfinkel MD [facility] 12/06/2019, 10:17 AM  CC: Ricardo Albee, MD

## 2019-12-08 ENCOUNTER — Ambulatory Visit (INDEPENDENT_AMBULATORY_CARE_PROVIDER_SITE_OTHER): Payer: Medicare Other

## 2019-12-09 ENCOUNTER — Ambulatory Visit (INDEPENDENT_AMBULATORY_CARE_PROVIDER_SITE_OTHER): Payer: Medicare Other | Admitting: Nurse Practitioner

## 2019-12-09 ENCOUNTER — Other Ambulatory Visit: Payer: Self-pay

## 2019-12-09 ENCOUNTER — Ambulatory Visit (INDEPENDENT_AMBULATORY_CARE_PROVIDER_SITE_OTHER): Payer: Medicare Other

## 2019-12-09 DIAGNOSIS — E291 Testicular hypofunction: Secondary | ICD-10-CM | POA: Diagnosis not present

## 2019-12-09 DIAGNOSIS — N5201 Erectile dysfunction due to arterial insufficiency: Secondary | ICD-10-CM

## 2019-12-09 NOTE — Progress Notes (Signed)
Pt given 0.64ml to the left thigh. Pt tolerated well.

## 2019-12-14 ENCOUNTER — Ambulatory Visit (INDEPENDENT_AMBULATORY_CARE_PROVIDER_SITE_OTHER): Payer: Medicare Other | Admitting: Internal Medicine

## 2019-12-15 ENCOUNTER — Other Ambulatory Visit: Payer: Self-pay

## 2019-12-15 ENCOUNTER — Ambulatory Visit (INDEPENDENT_AMBULATORY_CARE_PROVIDER_SITE_OTHER): Payer: Medicare Other

## 2019-12-15 VITALS — Temp 97.8°F | Resp 19 | Ht 67.0 in | Wt 249.0 lb

## 2019-12-15 DIAGNOSIS — F524 Premature ejaculation: Secondary | ICD-10-CM

## 2019-12-15 DIAGNOSIS — E291 Testicular hypofunction: Secondary | ICD-10-CM | POA: Diagnosis not present

## 2019-12-15 DIAGNOSIS — N5201 Erectile dysfunction due to arterial insufficiency: Secondary | ICD-10-CM

## 2019-12-15 NOTE — Progress Notes (Signed)
Pt given 0.45mL IM to he right thigh. Pt tolerated well. Sit for 5 mins.

## 2019-12-22 ENCOUNTER — Ambulatory Visit (INDEPENDENT_AMBULATORY_CARE_PROVIDER_SITE_OTHER): Payer: Medicare Other

## 2019-12-22 ENCOUNTER — Other Ambulatory Visit: Payer: Self-pay

## 2019-12-22 VITALS — BP 148/99 | Temp 97.4°F

## 2019-12-22 DIAGNOSIS — E291 Testicular hypofunction: Secondary | ICD-10-CM

## 2019-12-22 DIAGNOSIS — N5201 Erectile dysfunction due to arterial insufficiency: Secondary | ICD-10-CM

## 2019-12-22 NOTE — Progress Notes (Signed)
Pt given 0.31ml to the right thigh. Pt tolerated well. No complaints.

## 2019-12-27 ENCOUNTER — Ambulatory Visit: Payer: Medicare Other | Attending: Internal Medicine

## 2019-12-27 ENCOUNTER — Other Ambulatory Visit: Payer: Self-pay

## 2019-12-27 ENCOUNTER — Telehealth: Payer: Self-pay | Admitting: Cardiovascular Disease

## 2019-12-27 DIAGNOSIS — Z20822 Contact with and (suspected) exposure to covid-19: Secondary | ICD-10-CM

## 2019-12-27 IMAGING — CT CT CHEST LUNG CANCER SCREENING LOW DOSE W/O CM
2 of 5 series · 15 of 40 positions shown, 18 images · non-contrast
Comparison: Standard CT chest 10/09/2013.

CLINICAL DATA: 57-year-old male with 39 pack year history of
smoking. Lung cancer screening.

EXAM:
CT CHEST WITHOUT CONTRAST LOW-DOSE FOR LUNG CANCER SCREENING
TECHNIQUE: Multidetector CT imaging of the chest was performed following the
standard protocol without IV contrast.

[Series 4: lung 1.00 br44 cor · coronal · 0.69mm/px · 3 of 349 slices shown]
[im 70/349  lung]
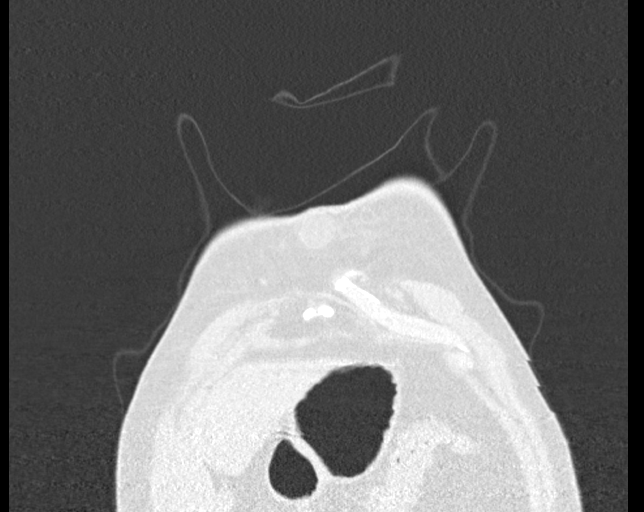
[im 140/349  lung]
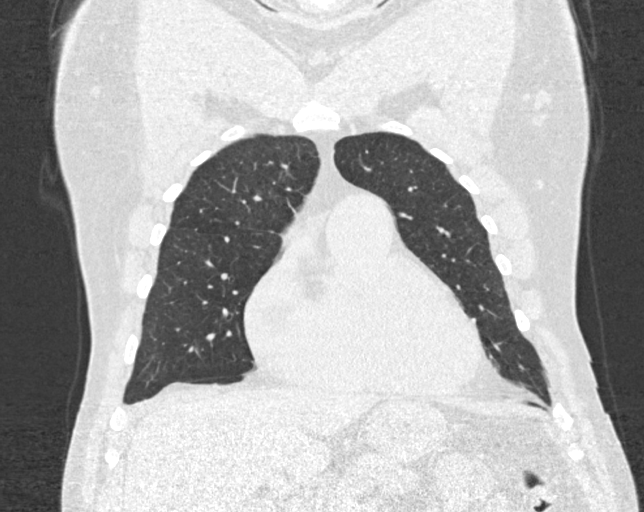
[im 209/349  lung]
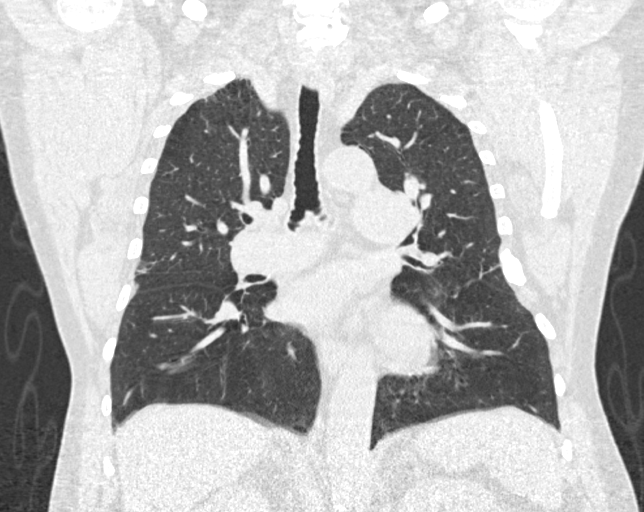

[Series 9: lung 1.00 br60 axial · axial · 0.87mm/px · z∈[-1147,-827]mm · 12 of 354 slices shown, 15 images]
[im 17/354  mediastinal]
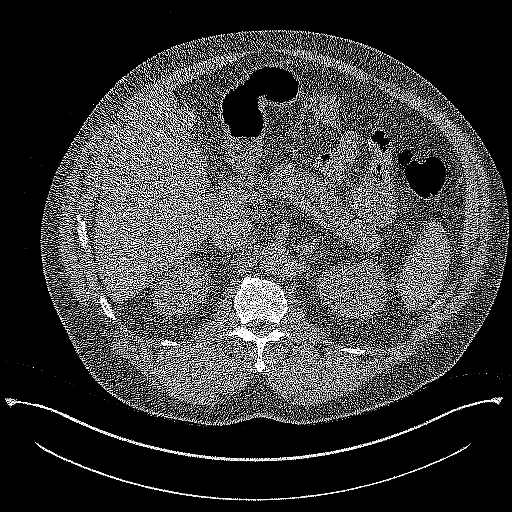
[im 17/354  lung]
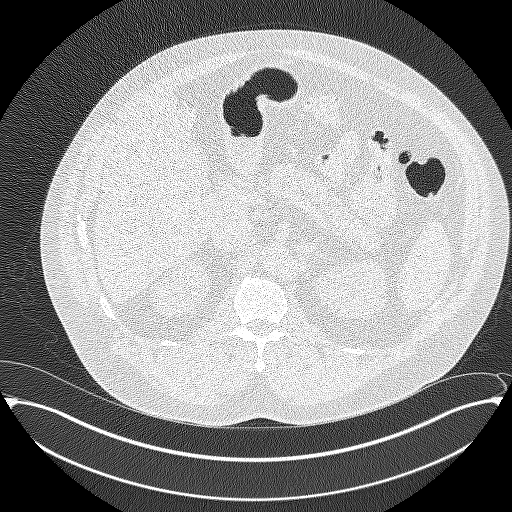
[im 51/354  lung]
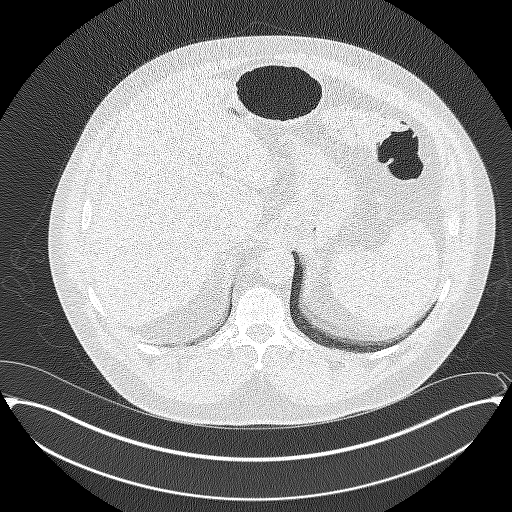
[im 85/354  lung]
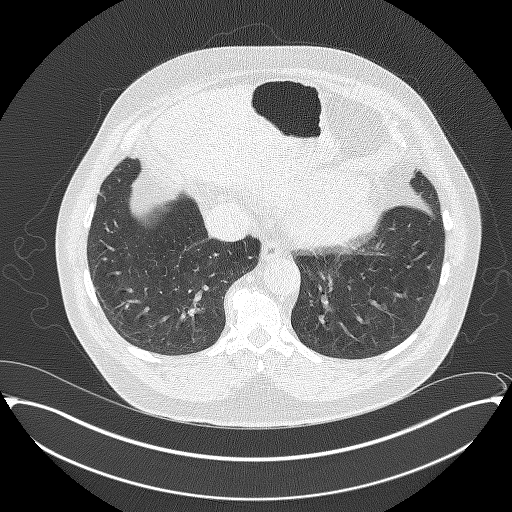
[im 101/354  lung]
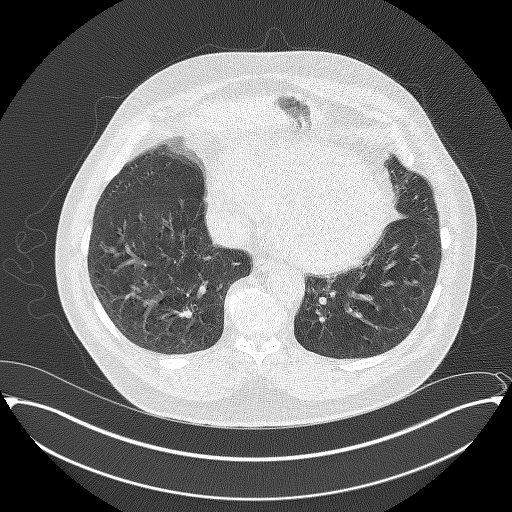
[im 135/354  mediastinal]
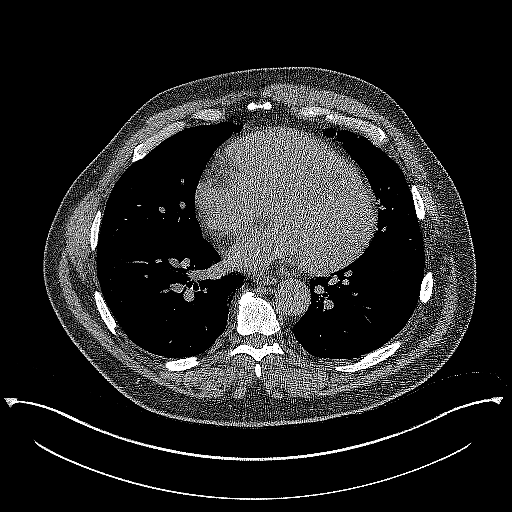
[im 135/354  lung]
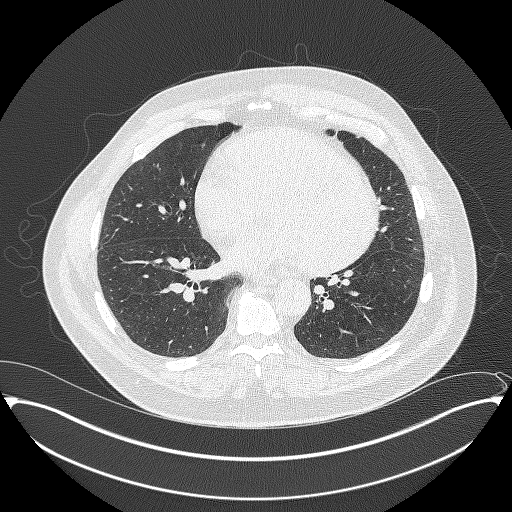
[im 169/354  lung]
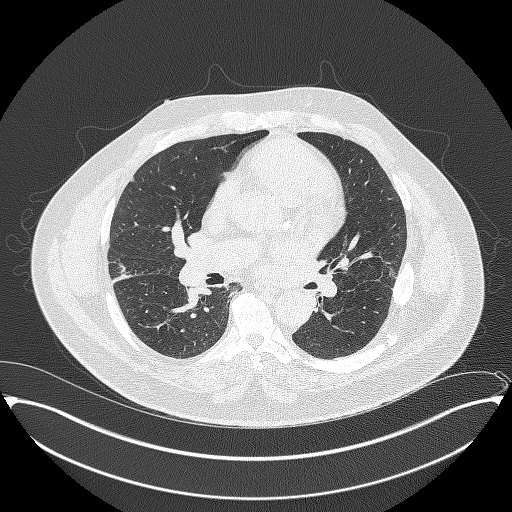
[im 185/354  lung]
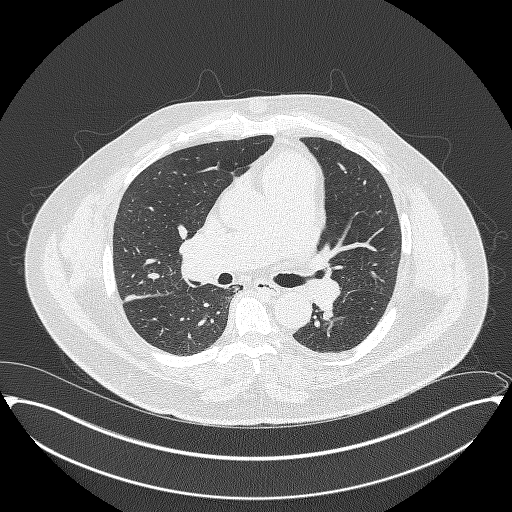
[im 219/354  lung]
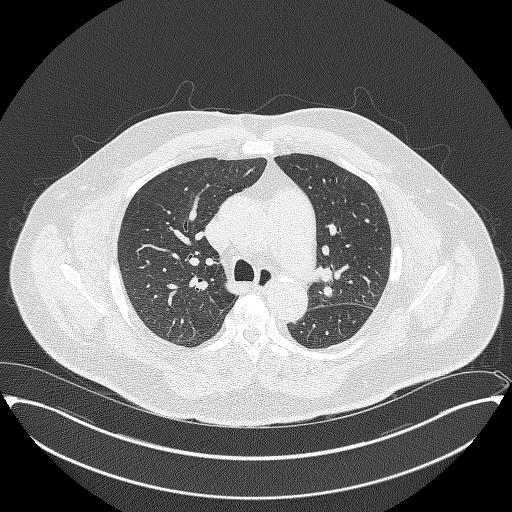
[im 253/354  mediastinal]
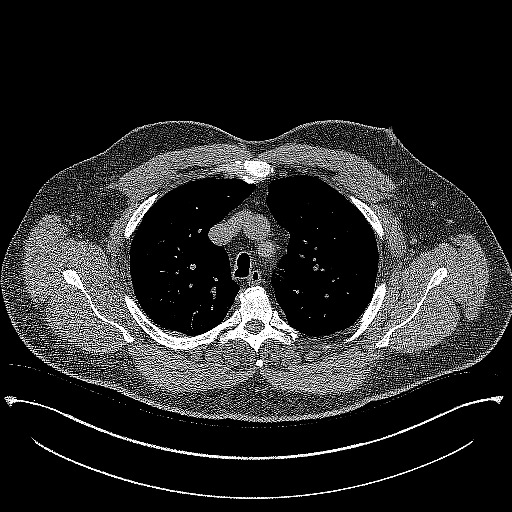
[im 253/354  lung]
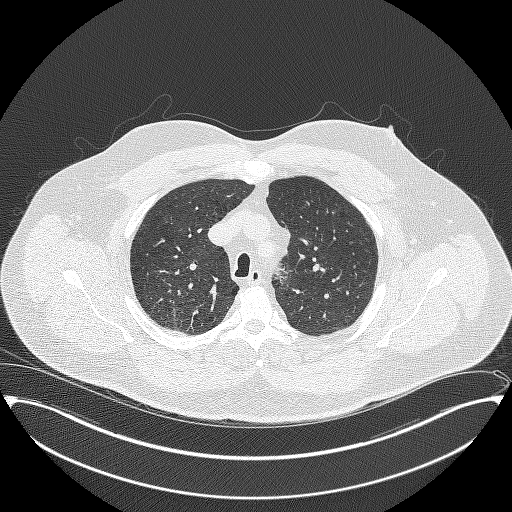
[im 269/354  lung]
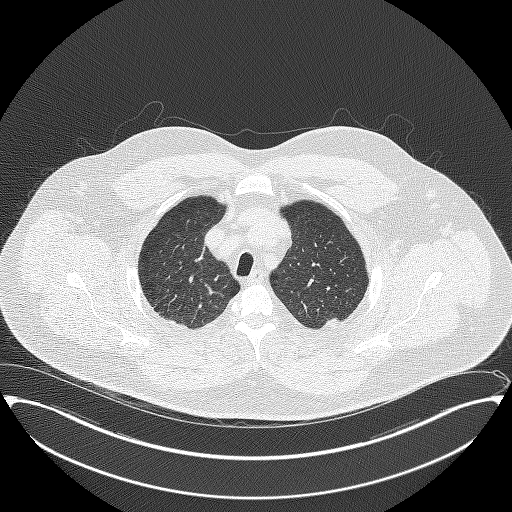
[im 303/354  lung]
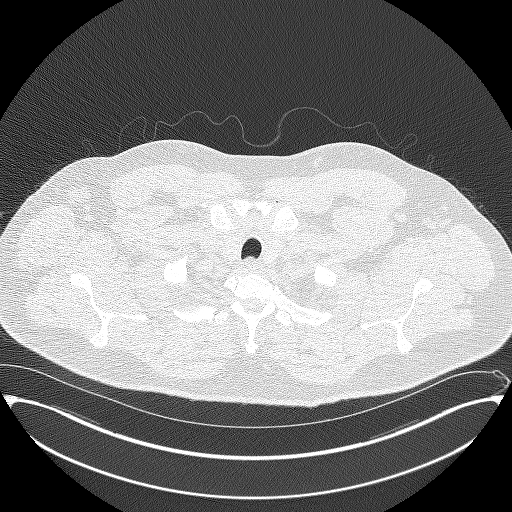
[im 337/354  lung]
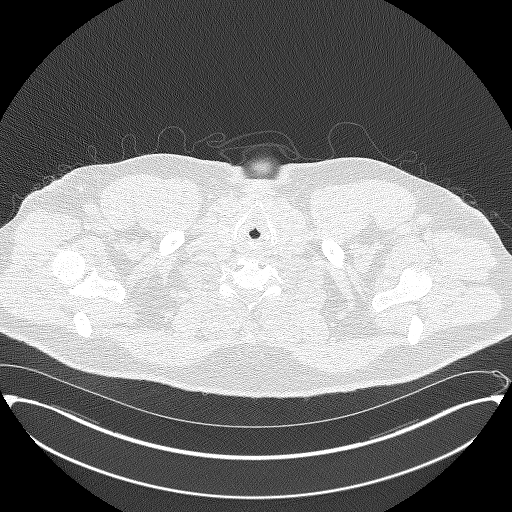

[15 of 40 positions shown; findings below may reference images not displayed]

FINDINGS: Cardiovascular: Heart size upper normal. No pericardial effusion.
Atherosclerotic calcification is noted in the wall of the thoracic
aorta. Enlargement of the main pulmonary arteries is compatible with
pulmonary arterial hypertension.

Mediastinum/Nodes: No mediastinal lymphadenopathy. No evidence for
gross hilar lymphadenopathy although assessment is limited by the
lack of intravenous contrast on today's study. The esophagus has
normal imaging features. Small bilateral axillary lymph nodes are
stable in the long interval since prior study compatible with benign
etiology.

Lungs/Pleura: Centrilobular and paraseptal emphysema evident. 3.8 mm
subpleural nodule noted right upper lobe. 3.2 mm nodule identified
left upper lobe. Atelectasis or scarring noted posteriorly along the
right major fissure. No overtly suspicious pulmonary nodule or mass.
No pleural effusion.

Upper Abdomen: Unremarkable.

Musculoskeletal: No worrisome lytic or sclerotic osseous
abnormality. Old left rib fractures evident
IMPRESSION: 1. Lung-RADS 2, benign appearance or behavior. Continue annual
screening with low-dose chest CT without contrast in 12 months.
2.  Emphysema. (OJ4AU-L6U.9)
3.  Aortic Atherosclerois (OJ4AU-170.0)

## 2019-12-27 NOTE — Telephone Encounter (Signed)

## 2019-12-28 ENCOUNTER — Telehealth: Payer: Self-pay | Admitting: Pulmonary Disease

## 2019-12-28 LAB — NOVEL CORONAVIRUS, NAA: SARS-CoV-2, NAA: NOT DETECTED

## 2019-12-28 NOTE — Telephone Encounter (Signed)
Called and spoke with Patient.  Patient stated he had a POC through [employer], and it was taken away broken, and never returned. Patient stated [employer] is back logged, and he was told they could not get him a new POC. Patient stated he was told he could get a new POC through [facility]. Patient is scheduled qualifying walk for POC, 01/03/20, at 0930.  Nothing further at this time.

## 2019-12-29 ENCOUNTER — Ambulatory Visit (INDEPENDENT_AMBULATORY_CARE_PROVIDER_SITE_OTHER): Payer: Medicare Other

## 2019-12-29 ENCOUNTER — Other Ambulatory Visit: Payer: Self-pay

## 2019-12-29 DIAGNOSIS — E291 Testicular hypofunction: Secondary | ICD-10-CM

## 2019-12-29 DIAGNOSIS — N5201 Erectile dysfunction due to arterial insufficiency: Secondary | ICD-10-CM

## 2019-12-29 NOTE — Progress Notes (Signed)
Injection into the left thigh patient tolerated the injection well

## 2019-12-31 ENCOUNTER — Telehealth: Payer: Self-pay

## 2019-12-31 NOTE — Telephone Encounter (Signed)
Patient said his phone said missed call, no message left

## 2019-12-31 NOTE — Telephone Encounter (Signed)
Pt called, requesting a return call from a Nurse. No questions shared  Please call [phone]  Thanks renee

## 2020-01-03 ENCOUNTER — Other Ambulatory Visit: Payer: Self-pay

## 2020-01-03 ENCOUNTER — Ambulatory Visit: Payer: Medicare Other | Admitting: Pulmonary Disease

## 2020-01-03 DIAGNOSIS — J449 Chronic obstructive pulmonary disease, unspecified: Secondary | ICD-10-CM

## 2020-01-03 DIAGNOSIS — I2729 Other secondary pulmonary hypertension: Secondary | ICD-10-CM

## 2020-01-03 DIAGNOSIS — I50810 Right heart failure, unspecified: Secondary | ICD-10-CM

## 2020-01-03 NOTE — Progress Notes (Signed)
Pt presented for a POC qualifying walk today.  Pt arrived on room air at 93%, O2 dropped to 86% after walking 1 lap.  Pt was placed on POC at 2lpm pulse dose, O2 rose to 94%.  Pt walked a lap, O2 dropped to 87%.  Increased O2 to 3lpm pulse dose, O2 rose to 92%.  Pt walked another lap, O2 remained stable at 91%.  Order placed for pt to be put on POC with 3lpm exertion.

## 2020-01-05 ENCOUNTER — Encounter: Payer: Self-pay | Admitting: Cardiovascular Disease

## 2020-01-05 ENCOUNTER — Ambulatory Visit (INDEPENDENT_AMBULATORY_CARE_PROVIDER_SITE_OTHER): Payer: Medicare Other

## 2020-01-05 ENCOUNTER — Telehealth (INDEPENDENT_AMBULATORY_CARE_PROVIDER_SITE_OTHER): Payer: Medicare Other | Admitting: Cardiovascular Disease

## 2020-01-05 VITALS — Ht 67.0 in | Wt 249.0 lb

## 2020-01-05 DIAGNOSIS — G4733 Obstructive sleep apnea (adult) (pediatric): Secondary | ICD-10-CM

## 2020-01-05 DIAGNOSIS — J449 Chronic obstructive pulmonary disease, unspecified: Secondary | ICD-10-CM

## 2020-01-05 DIAGNOSIS — Z720 Tobacco use: Secondary | ICD-10-CM

## 2020-01-05 DIAGNOSIS — I50810 Right heart failure, unspecified: Secondary | ICD-10-CM | POA: Diagnosis not present

## 2020-01-05 DIAGNOSIS — Z9989 Dependence on other enabling machines and devices: Secondary | ICD-10-CM

## 2020-01-05 DIAGNOSIS — I2729 Other secondary pulmonary hypertension: Secondary | ICD-10-CM | POA: Diagnosis not present

## 2020-01-05 DIAGNOSIS — I10 Essential (primary) hypertension: Secondary | ICD-10-CM

## 2020-01-05 NOTE — Progress Notes (Signed)
Virtual Visit via Telephone Note   This visit type was conducted due to national recommendations for restrictions regarding the COVID-19 Pandemic (e.g. social distancing) in an effort to limit this patient's exposure and mitigate transmission in our community.  Due to his co-morbid illnesses, this patient is at least at moderate risk for complications without adequate follow up.  This format is felt to be most appropriate for this patient at this time.  The patient did not have access to video technology/had technical difficulties with video requiring transitioning to audio format only (telephone).  All issues noted in this document were discussed and addressed.  No physical exam could be performed with this format.  Please refer to the patient's chart for his  consent to telehealth for [facility].   Date:  01/05/2020   ID:  Ricardo Burch, DOB 1961/03/04, MRN [id number]  Patient Location: Home Provider Location: Home  PCP:  Doree Albee, MD  Cardiologist:  Kate Sable, MD  Electrophysiologist:  None   Evaluation Performed:  Follow-Up Visit  Chief Complaint:  Right heart failure/pulmonary hypertension  History of Present Illness:    Ricardo Burch is a 59 y.o. male with severe COPD, severe OSA, tobacco use, and pulmonary HTN.  He denies chest pain. Chronic exertional dyspnea is stable. He uses 3L O2 continuously. He does not have any significant leg swelling.  We talked about the importance of a low sodium diet.  He has not been exercising much but wants to begin walking.    Past Medical History:  Diagnosis Date  . Allergy   . CHF (congestive heart failure) ([place])   . Chronic kidney disease   . COPD (chronic obstructive pulmonary disease) ([place]) Dx 2015  . Depression   . Eczema    since childhood   . Erectile dysfunction 08/18/2019  . Hyperlipidemia 01/12/2018  . Hypertension   . Oxygen deficiency   . Sleep apnea    CPAP  . Substance abuse ([place])    MJ, cocaine  .  Testicular failure 07/22/2019   Past Surgical History:  Procedure Laterality Date  . COLONOSCOPY WITH PROPOFOL N/A 06/25/2017   Procedure: COLONOSCOPY WITH PROPOFOL;  Surgeon: Daneil Dolin, MD;  [facility];  Service: Endoscopy;  Laterality: N/A;  2:00pm  . MULTIPLE EXTRACTIONS WITH ALVEOLOPLASTY N/A 03/22/2016   Procedure: MULTIPLE EXTRACTION WITH ALVEOLOPLASTY;  Surgeon: Diona Browner, DDS;  Location: [facility];  Service: Oral Surgery;  Laterality: N/A;  . NO PAST SURGERIES    . POLYPECTOMY  06/25/2017   Procedure: POLYPECTOMY;  Surgeon: Daneil Dolin, MD;  [facility];  Service: Endoscopy;;  colon     Current Meds  Medication Sig  . albuterol (VENTOLIN HFA) 108 (90 Base) MCG/ACT inhaler INHALE 1 OR 2 PUFFS BY MOUTH EVERY 6 HOURS AS NEEDED FOR WHEEZING OR SHORTNESS OF BREATH  . amLODipine (NORVASC) 5 MG tablet TAKE 1 TABLET BY MOUTH EVERY DAY  . Budeson-Glycopyrrol-Formoterol (BREZTRI AEROSPHERE) 160-9-4.8 MCG/ACT AERO Inhale 2 puffs into the lungs 2 (two) times daily.  . Budeson-Glycopyrrol-Formoterol (BREZTRI AEROSPHERE) 160-9-4.8 MCG/ACT AERO Inhale 2 puffs into the lungs 2 (two) times daily.  . cyclobenzaprine (FLEXERIL) 5 MG tablet Take 1 tablet (5 mg total) by mouth 3 (three) times daily as needed.  [name] DALIRESP 500 MCG TABS tablet TAKE 1 TABLET BY MOUTH ONCE DAILY  . fluticasone (FLONASE) 50 MCG/ACT nasal spray INSTILL 2 SPRAYS IN EACH NOSTRIL TWICE DAILY  . ipratropium-albuterol (DUONEB) 0.5-2.5 (3) MG/3ML SOLN  Take 3 mLs by nebulization every 4 (four) hours as needed.  . naproxen (NAPROSYN) 500 MG tablet Take 1 tablet (500 mg total) by mouth 2 (two) times daily with a meal.  . OXYGEN Inhale 2 L into the lungs at bedtime.   . tadalafil (CIALIS) 20 MG tablet Take 0.5 tablets (10 mg total) by mouth daily as needed for erectile dysfunction.  [name] testosterone cypionate (DEPOTESTOSTERONE CYPIONATE) 200 MG/ML injection Inject 0.6 mLs (120 mg total) into the muscle once a  week. 0.71ml/week  . [DISCONTINUED] aspirin EC 81 MG tablet Take 1 tablet (81 mg total) by mouth daily.   Current Facility-Administered Medications for the 01/05/20 encounter (Telemedicine) with Herminio Commons, MD  Medication  . testosterone cypionate (DEPOTESTOSTERONE CYPIONATE) injection 120 mg     Allergies:   Apresoline [hydralazine]   Social History   Tobacco Use  . Smoking status: Former Smoker    Packs/day: 1.00    Years: 38.00    Pack years: 38.00    Types: Cigarettes    Quit date: 10/24/2019    Years since quitting: 0.2  . Smokeless tobacco: Never Used  Substance Use Topics  . Alcohol use: Yes    Alcohol/week: 1.0 standard drinks    Types: 1 Cans of beer per week    Comment: occassionally: weekends, 6-pack or less on a day; or half pint of liquior  . Drug use: Not Currently    Types: Marijuana, Cocaine    Comment: not current     Family Hx: The patient's family history includes Alcohol abuse in his father; Arthritis in his mother; Cancer in his brother; Cerebral aneurysm in his father; Hypertension in his mother; Stroke in his mother. There is no history of Diabetes or Heart disease.  ROS:   Please see the history of present illness.     All other systems reviewed and are negative.   Prior CV studies:   The following studies were reviewed today:  Echocardiogram: 09/2018 Study Conclusions  - Left ventricle: The cavity size was normal. Wall thickness was increased in a pattern of moderate LVH. Systolic function was normal. The estimated ejection fraction was in the range of 60% to 65%. Wall motion was normal; there were no regional wall motion abnormalities. The study is not technically sufficient to allow evaluation of LV diastolic function. - Aortic valve: Valve area (VTI): 2.53 cm^2. Valve area (Vmax): 2.11 cm^2. Valve area (Vmean): 1.82 cm^2. - Right ventricle: The cavity size was moderately to severely dilated. - Right atrium: The  atrium was moderately dilated. - Technically difficult study. Echocontrast was used to enhance visualization.  Labs/Other Tests and Data Reviewed:    EKG:  No ECG reviewed.  Recent Labs: 06/15/2019: Hemoglobin 18.7 Repeated and verified X2.; Platelets 198.0 07/22/2019: ALT 14; BUN 10; Creat 0.91; Potassium 4.7; Sodium 137   Recent Lipid Panel Lab Results  Component Value Date/Time   CHOL 122 07/22/2019 02:30 PM   TRIG 67 07/22/2019 02:30 PM   HDL 40 07/22/2019 02:30 PM   CHOLHDL 3.1 07/22/2019 02:30 PM   LDLCALC 68 07/22/2019 02:30 PM    Wt Readings from Last 3 Encounters:  01/05/20 249 lb (112.9 kg)  12/15/19 249 lb (112.9 kg)  12/06/19 249 lb 6.4 oz (113.1 kg)     Objective:    Vital Signs:  Ht 5\' 7"  (1.702 m)   Wt 249 lb (112.9 kg)   BMI 39.00 kg/m    VITAL SIGNS:  reviewed  ASSESSMENT & PLAN:  1.  Pulmonary hypertension/right heart failure: This is secondary to severe COPD and severe obstructive sleep apnea.  He does not require diuretics at this time.  Echocardiogram in 2019 demonstrated low normal RV systolic function.  2.  Hypertension: Blood pressure is normal.  No changes to therapy.  3.  Severe COPD: Currently uses 3 L of oxygen continuously.  4.  Obstructive sleep apnea: Compliant with CPAP.  5.  History of tobacco use.     COVID-19 Education: The signs and symptoms of COVID-19 were discussed with the patient and how to seek care for testing (follow up with PCP or arrange E-visit).  The importance of social distancing was discussed today.  Time:   Today, I have spent 15 minutes with the patient with telehealth technology discussing the above problems.     Medication Adjustments/Labs and Tests Ordered: Current medicines are reviewed at length with the patient today.  Concerns regarding medicines are outlined above.   Tests Ordered: No orders of the defined types were placed in this encounter.   Medication Changes: No orders of the defined  types were placed in this encounter.   Follow Up:  Virtual Visit  in 1 year(s)  Signed, Kate Sable, MD  01/05/2020 8:27 AM    [place]

## 2020-01-05 NOTE — Patient Instructions (Signed)

## 2020-01-06 ENCOUNTER — Other Ambulatory Visit (INDEPENDENT_AMBULATORY_CARE_PROVIDER_SITE_OTHER): Payer: Self-pay | Admitting: Internal Medicine

## 2020-01-06 ENCOUNTER — Telehealth: Payer: Self-pay | Admitting: Pulmonary Disease

## 2020-01-06 ENCOUNTER — Other Ambulatory Visit: Payer: Self-pay

## 2020-01-06 ENCOUNTER — Ambulatory Visit (INDEPENDENT_AMBULATORY_CARE_PROVIDER_SITE_OTHER): Payer: Medicare Other

## 2020-01-06 DIAGNOSIS — N5201 Erectile dysfunction due to arterial insufficiency: Secondary | ICD-10-CM

## 2020-01-06 DIAGNOSIS — E291 Testicular hypofunction: Secondary | ICD-10-CM | POA: Diagnosis not present

## 2020-01-06 MED ORDER — TADALAFIL 20 MG PO TABS: 10.0000 mg | ORAL_TABLET | Freq: Every day | ORAL | 2 refills | Status: DC | PRN

## 2020-01-06 NOTE — Telephone Encounter (Signed)
Spoke with pt, he states he stays the night over his girlfriends house he is asking about using a POC to attach to his CPAP. I advised him that I wouldn't advise that and didn't recommend he go without oxygen while he is over there since he cannot travel with the big concentrator that he has at his house. Pt understood and stated he would figure out something but advised him to call us if he needed any additional help. Nothing further is needed.

## 2020-01-12 ENCOUNTER — Other Ambulatory Visit: Payer: Self-pay

## 2020-01-12 ENCOUNTER — Encounter (INDEPENDENT_AMBULATORY_CARE_PROVIDER_SITE_OTHER): Payer: Self-pay

## 2020-01-12 ENCOUNTER — Ambulatory Visit (INDEPENDENT_AMBULATORY_CARE_PROVIDER_SITE_OTHER): Payer: Medicare Other | Admitting: Internal Medicine

## 2020-01-12 ENCOUNTER — Ambulatory Visit (INDEPENDENT_AMBULATORY_CARE_PROVIDER_SITE_OTHER): Payer: Medicare Other

## 2020-01-12 ENCOUNTER — Encounter (INDEPENDENT_AMBULATORY_CARE_PROVIDER_SITE_OTHER): Payer: Self-pay | Admitting: Internal Medicine

## 2020-01-12 VITALS — BP 130/88 | HR 103 | Temp 97.8°F | Ht 67.0 in | Wt 251.4 lb

## 2020-01-12 DIAGNOSIS — E291 Testicular hypofunction: Secondary | ICD-10-CM

## 2020-01-12 DIAGNOSIS — E559 Vitamin D deficiency, unspecified: Secondary | ICD-10-CM

## 2020-01-12 DIAGNOSIS — I10 Essential (primary) hypertension: Secondary | ICD-10-CM

## 2020-01-12 NOTE — Progress Notes (Signed)
Metrics: Intervention Frequency ACO  Documented Smoking Status Yearly  Screened one or more times in 24 months  Cessation Counseling or  Active cessation medication Past 24 months  Past 24 months   Guideline developer: UpToDate (See UpToDate for funding source) Date Released: 2014       Wellness Office Visit  Subjective:  Patient ID: Ricardo Burch, male    DOB: 1961-04-01  Age: 59 y.o. MRN: [id number]  CC: This man comes in for follow-up of hypogonadism, vitamin D deficiency.  He was due to have an annual physical today but time did not allow this and we will do this the next time.  HPI  I increased his testosterone dose the last visit and he is taking 0.6 mL once a week. He also has been taking vitamin D3 supplementation 10,000 units on most days. Past Medical History:  Diagnosis Date  . Allergy   . CHF (congestive heart failure) ([place])   . Chronic kidney disease   . COPD (chronic obstructive pulmonary disease) ([place]) Dx 2015  . Depression   . Eczema    since childhood   . Erectile dysfunction 08/18/2019  . Hyperlipidemia 01/12/2018  . Hypertension   . Oxygen deficiency   . Sleep apnea    CPAP  . Substance abuse ([place])    MJ, cocaine  . Testicular failure 07/22/2019      Family History  Problem Relation Age of Onset  . Arthritis Mother   . Hypertension Mother   . Stroke Mother        age 1  . Cerebral aneurysm Father   . Alcohol abuse Father   . Cancer Brother   . Diabetes Neg Hx   . Heart disease Neg Hx     Social History   Social History Narrative    Lives alone.    5 daughters 23, 26 yo twins, eldest 2 are married live in [place]. Retired.Girlfriend works in Armed forces logistics/support/administrative officer.   Social History   Tobacco Use  . Smoking status: Former Smoker    Packs/day: 1.00    Years: 38.00    Pack years: 38.00    Types: Cigarettes    Quit date: 10/24/2019    Years since quitting: 0.2  . Smokeless tobacco: Never Used  Substance Use Topics  . Alcohol use: Yes   Alcohol/week: 1.0 standard drinks    Types: 1 Cans of beer per week    Comment: occassionally: weekends, 6-pack or less on a day; or half pint of liquior    Current Meds  Medication Sig  . albuterol (VENTOLIN HFA) 108 (90 Base) MCG/ACT inhaler INHALE 1 OR 2 PUFFS BY MOUTH EVERY 6 HOURS AS NEEDED FOR WHEEZING OR SHORTNESS OF BREATH  . amLODipine (NORVASC) 5 MG tablet TAKE 1 TABLET BY MOUTH EVERY DAY  . Budeson-Glycopyrrol-Formoterol (BREZTRI AEROSPHERE) 160-9-4.8 MCG/ACT AERO Inhale 2 puffs into the lungs 2 (two) times daily. (Patient taking differently: Inhale 1 puff into the lungs 2 (two) times daily. )  . Cholecalciferol (VITAMIN D-3) 125 MCG (5000 UT) TABS Take 2 tablets by mouth daily.  [name] DALIRESP 500 MCG TABS tablet TAKE 1 TABLET BY MOUTH ONCE DAILY  . fluticasone (FLONASE) 50 MCG/ACT nasal spray INSTILL 2 SPRAYS IN EACH NOSTRIL TWICE DAILY  . ipratropium-albuterol (DUONEB) 0.5-2.5 (3) MG/3ML SOLN Take 3 mLs by nebulization every 4 (four) hours as needed.  . OXYGEN Inhale 2 L into the lungs at bedtime.   . tadalafil (CIALIS) 20 MG tablet Take 0.5 tablets (  10 mg total) by mouth daily as needed for erectile dysfunction.  [name] testosterone cypionate (DEPOTESTOSTERONE CYPIONATE) 200 MG/ML injection Inject 0.6 mLs (120 mg total) into the muscle once a week. 0.38ml/week  . [DISCONTINUED] naproxen (NAPROSYN) 500 MG tablet Take 1 tablet (500 mg total) by mouth 2 (two) times daily with a meal.   Current Facility-Administered Medications for the 01/12/20 encounter (Office Visit) with Doree Albee, MD  Medication  . testosterone cypionate (DEPOTESTOSTERONE CYPIONATE) injection 120 mg       Objective:   Today's Vitals: BP 130/88 (BP Location: Left Arm, Patient Position: Sitting, Cuff Size: Normal)   Pulse (!) 103   Temp 97.8 F (36.6 C) (Temporal)   Ht 5\' 7"  (1.702 m)   Wt 251 lb 6.4 oz (114 kg)   SpO2 (!) 86%   BMI 39.37 kg/m  Vitals with BMI 01/12/2020 01/05/2020 12/22/2019  Height 5'  7" 5\' 7"  -  Weight 251 lbs 6 oz 249 lbs -  BMI [id number] -  Systolic [id number]  Diastolic 88 - 99  Pulse [id number] - (No Data)     Physical Exam   He looks systemically well.  Blood pressure is stable and weight is stable.    Assessment   1. Essential hypertension   2. Vitamin D deficiency disease   3. Testicular failure       Tests ordered Orders Placed This Encounter  Procedures  . COMPLETE METABOLIC PANEL WITH GFR  . Testosterone Total,Free,Bio, Males  . VITAMIN D 25 Hydroxy (Vit-D Deficiency, Fractures)     Plan: 1. He will continue with antihypertensive therapy as before. 2. He will continue with vitamin D3 supplementation we will check levels today. 3. He will continue the current dose of testosterone and we will check levels today. 4. Further recommendations will depend on blood results and I will see him in 3 months for an annual physical exam.   No orders of the defined types were placed in this encounter.   Doree Albee, MD

## 2020-01-13 ENCOUNTER — Ambulatory Visit (INDEPENDENT_AMBULATORY_CARE_PROVIDER_SITE_OTHER): Payer: Medicare Other

## 2020-01-13 DIAGNOSIS — E291 Testicular hypofunction: Secondary | ICD-10-CM

## 2020-01-13 DIAGNOSIS — N5201 Erectile dysfunction due to arterial insufficiency: Secondary | ICD-10-CM

## 2020-01-13 LAB — COMPLETE METABOLIC PANEL WITH GFR
AG Ratio: 1.4 (calc) (ref 1.0–2.5)
ALT: 20 U/L (ref 9–46)
AST: 19 U/L (ref 10–35)
Albumin: 4.1 g/dL (ref 3.6–5.1)
Alkaline phosphatase (APISO): 89 U/L (ref 35–144)
BUN: 12 mg/dL (ref 7–25)
CO2: 35 mmol/L — ABNORMAL HIGH (ref 20–32)
Calcium: 9.2 mg/dL (ref 8.6–10.3)
Chloride: 92 mmol/L — ABNORMAL LOW (ref 98–110)
Creat: 0.96 mg/dL (ref 0.70–1.33)
GFR, Est African American: 102 mL/min/{1.73_m2} (ref >=60)
GFR, Est Non African American: 88 mL/min/{1.73_m2} (ref >=60)
Globulin: 2.9 g/dL (calc) (ref 1.9–3.7)
Glucose, Bld: 78 mg/dL (ref 65–99)
Potassium: 4.4 mmol/L (ref 3.5–5.3)
Sodium: 139 mmol/L (ref 135–146)
Total Bilirubin: 2.1 mg/dL — ABNORMAL HIGH (ref 0.2–1.2)
Total Protein: 7 g/dL (ref 6.1–8.1)

## 2020-01-13 LAB — TESTOSTERONE TOTAL,FREE,BIO, MALES
Albumin: 4.1 g/dL (ref 3.6–5.1)
Sex Hormone Binding: 16 nmol/L — ABNORMAL LOW (ref 22–77)
Testosterone, Bioavailable: 209.3 ng/dL (ref >=110.0)
Testosterone, Free: 111.2 pg/mL (ref 46.0–224.0)
Testosterone: 466 ng/dL (ref 250–827)

## 2020-01-13 LAB — VITAMIN D 25 HYDROXY (VIT D DEFICIENCY, FRACTURES): Vit D, 25-Hydroxy: 23 ng/mL — ABNORMAL LOW (ref 30–100)

## 2020-01-18 ENCOUNTER — Telehealth: Payer: Self-pay | Admitting: Internal Medicine

## 2020-01-18 ENCOUNTER — Telehealth: Payer: Self-pay

## 2020-01-18 NOTE — Telephone Encounter (Signed)
Referral placed for patient, he is aware

## 2020-01-18 NOTE — Telephone Encounter (Signed)
He may qualify for pulmonary rehabilitation.  Can put referral in.  You can use diagnosis of pulmonary hypertension and COPD.

## 2020-01-18 NOTE — Telephone Encounter (Signed)
Pt called asking if he could speak to the nurse. I told him she was with patients. He said he had a few questions he wanted to ask before making an appointment. [phone]

## 2020-01-18 NOTE — Telephone Encounter (Signed)
Spoke with pt. Pt was scheduled an apt. Pt hadn't been in the office in a while and discussed belching after eating and making him feel better once he belches. Pt is aware that he needs an eval.

## 2020-01-18 NOTE — Telephone Encounter (Signed)
Patient says feet and ankles have been swollen for the past 10 days.He has recently started eating out and he does understand about too much sodium intake.He will elevate legs while sitting and pursue getting his prescription support hose. Wt: 249 lbs (was 251 lbs at tele-vist 01/05/20) Denies SOB    He asks if he can have a referral to go to either cardiac or pulmonary rehab.

## 2020-01-18 NOTE — Telephone Encounter (Signed)
Rec'd call. Pt states legs are swollen.  Please call  [phone]  Thanks renee

## 2020-01-19 ENCOUNTER — Ambulatory Visit (INDEPENDENT_AMBULATORY_CARE_PROVIDER_SITE_OTHER): Payer: Medicare Other

## 2020-01-19 ENCOUNTER — Other Ambulatory Visit: Payer: Self-pay

## 2020-01-19 VITALS — Wt 249.0 lb

## 2020-01-19 DIAGNOSIS — R0981 Nasal congestion: Secondary | ICD-10-CM

## 2020-01-19 DIAGNOSIS — E291 Testicular hypofunction: Secondary | ICD-10-CM

## 2020-01-19 NOTE — Progress Notes (Signed)
Pt was given 0.19ml to the right thigh. Pt tolerated well.

## 2020-01-25 ENCOUNTER — Encounter: Payer: Self-pay | Admitting: Nurse Practitioner

## 2020-01-25 ENCOUNTER — Other Ambulatory Visit: Payer: Self-pay

## 2020-01-25 ENCOUNTER — Ambulatory Visit (INDEPENDENT_AMBULATORY_CARE_PROVIDER_SITE_OTHER): Payer: Medicare Other | Admitting: Nurse Practitioner

## 2020-01-25 DIAGNOSIS — K219 Gastro-esophageal reflux disease without esophagitis: Secondary | ICD-10-CM | POA: Diagnosis not present

## 2020-01-25 DIAGNOSIS — R140 Abdominal distension (gaseous): Secondary | ICD-10-CM | POA: Diagnosis not present

## 2020-01-25 HISTORY — DX: Gastro-esophageal reflux disease without esophagitis: K21.9

## 2020-01-25 MED ORDER — PANTOPRAZOLE SODIUM 40 MG PO TBEC: 40.0000 mg | DELAYED_RELEASE_TABLET | Freq: Every day | ORAL | 3 refills | Status: DC

## 2020-01-25 NOTE — Patient Instructions (Signed)
Your health issues we discussed today were:   GERD with belching and bloating: 1. I have sent in Protonix 40 mg to your pharmacy.  Take this once a day, pursing in the morning on an empty stomach 2. Call us if your symptoms do not get better within the next month 3. As we discussed, you should switch to 6 [place] meals a day to help with your bloating.  This will also help with weight loss 4. Call us if you have any worsening or severe symptoms  Overall I recommend:  1. Continue your other current medications 2. Return for follow-up in 4 months 3. Call us if you have any questions or concerns   ---------------------------------------------------------------  COVID-19 Vaccine Information can be found at: [email or web address].uk For questions related to vaccine distribution or appointments, please email vaccine@ [email or web address] or call [phone].   ---------------------------------------------------------------   At [facility] we value your feedback. You may receive a survey about your visit today. Please share your experience as we strive to create trusting relationships with our patients to provide genuine, compassionate, quality care.  We appreciate your understanding and patience as we review any laboratory studies, imaging, and other diagnostic tests that are ordered as we care for you. Our office policy is 5 business days for review of these results, and any emergent or urgent results are addressed in a timely manner for your best interest. If you do not hear from our office in 1 week, please contact us.   We also encourage the use of MyChart, which contains your medical information for your review as well. If you are not enrolled in this feature, an access code is on this after visit summary for your convenience. Thank you for allowing [place] to be involved in your care.  It was great to see you today!  I hope you have  a great day!!

## 2020-01-25 NOTE — Assessment & Plan Note (Signed)
Intermittent GERD symptoms along with frequent belching.  He does have stomach and esophageal burning.  At this point I will start him on Protonix 40 mg once daily.  Return for follow-up in 4 months.  Call for any worsening or severe symptoms.  I am hopeful that Protonix will help with his overall symptom presentation as well as [place] meals, as per below.

## 2020-01-25 NOTE — Assessment & Plan Note (Signed)
Bloating typically occurring only when he eats a large meal.  He has been eating 1 large meal and 2 Zehner snacks a day in order to lose weight.  I recommended he try to switch to 6 more meals a day to help with weight loss as well as eliminate bloating.  When he does have a Stolp snack he does not experience his bloating.  Return for follow-up in 4 months.

## 2020-01-25 NOTE — Progress Notes (Signed)
Referring Provider: Doree Albee, MD Primary Care Physician:  Doree Albee, MD Primary GI:  Dr. Gala Romney  Chief Complaint  Patient presents with  . Gastroesophageal Reflux    occ; belching  . Bloated    HPI:   Ricardo Burch is a 59 y.o. male who presents to discuss GERD and belching.  The patient was last seen in our office 05/29/2017 for social alcohol use and to schedule a colonoscopy.  Doing well and generally asymptomatic from a GI standpoint.  Baseline COPD and breathing is better than baseline.  No previous complications with anesthesia.  Recommended complete colonoscopy, further recommendations to follow.   Colonoscopy was completed 06/25/2017 which found diverticulosis in the sigmoid and descending colon, two 4 to 6 mm polyps in the descending colon ascending colon, otherwise normal.  Surgical pathology found the polyps to be tubular adenoma.  Recommended repeat colonoscopy in 5 years (2023).   Review of past medical history indicates no history of GERD.  No previous EGD in our system.  Today he states he's doing ok overall. Is having intermittent GERD symptoms about 2 weeks ago. After eating he feels bloated and this causes him to get short-winded. When he belches or has a bowel movement his breathing gets better. No history of DM. He has a decent size meal typicaly (ie- dinner could be steak, baked potato, and salad). Used to be able to eat more previously without issue. Trying to lose weight and only eats one meal day. No symptoms with a Buscemi snack. Has epigastric and esophageal discomfort after eating at time along with bitter/sour taste. Denies overt abdominal pain. Has lower, one-sided back pain (was previously told he has arthritis) that typically happens if he eats a large meal. Has a bowel movement about once a day, no constipation and straining; states some of his COPD medications causes constipation and diarrhea. Denies N/V, hematochezia, melena, fever, chills,  unintentional weight loss. Denies URI or flu-like symptoms. Denies loss of sense of taste or smell. Was last tested for COVID-19 beginning of March which was negative. He had his first dose of COVID-19 vaccine yesterday, has an appointment 02/21/20 for the second dose. Wears 2 lpm O2, breathing is at baseline currently. Denies chest pain, dizziness, lightheadedness, syncope, near syncope. Denies any other upper or lower GI symptoms.  Past Medical History:  Diagnosis Date  . Allergy   . CHF (congestive heart failure) ([place])   . Chronic kidney disease   . COPD (chronic obstructive pulmonary disease) ([place]) Dx 2015  . Depression   . Eczema    since childhood   . Erectile dysfunction 08/18/2019  . Hyperlipidemia 01/12/2018  . Hypertension   . Oxygen deficiency   . Sleep apnea    CPAP  . Substance abuse ([place])    MJ, cocaine  . Testicular failure 07/22/2019    Past Surgical History:  Procedure Laterality Date  . COLONOSCOPY WITH PROPOFOL N/A 06/25/2017   Procedure: COLONOSCOPY WITH PROPOFOL;  Surgeon: Daneil Dolin, MD;  [facility];  Service: Endoscopy;  Laterality: N/A;  2:00pm  . MULTIPLE EXTRACTIONS WITH ALVEOLOPLASTY N/A 03/22/2016   Procedure: MULTIPLE EXTRACTION WITH ALVEOLOPLASTY;  Surgeon: Diona Browner, DDS;  Location: [facility];  Service: Oral Surgery;  Laterality: N/A;  . NO PAST SURGERIES    . POLYPECTOMY  06/25/2017   Procedure: POLYPECTOMY;  Surgeon: Daneil Dolin, MD;  [facility];  Service: Endoscopy;;  colon    Current Outpatient Medications  Medication Sig Dispense Refill  . albuterol (VENTOLIN HFA) 108 (90 Base) MCG/ACT inhaler INHALE 1 OR 2 PUFFS BY MOUTH EVERY 6 HOURS AS NEEDED FOR WHEEZING OR SHORTNESS OF BREATH 18 g 4  . amLODipine (NORVASC) 5 MG tablet TAKE 1 TABLET BY MOUTH EVERY DAY 90 tablet 0  . Budeson-Glycopyrrol-Formoterol (BREZTRI AEROSPHERE) 160-9-4.8 MCG/ACT AERO Inhale 2 puffs into the lungs 2 (two) times daily. (Patient taking  differently: Inhale 1 puff into the lungs 2 (two) times daily. ) 10.7 g 3  . Cholecalciferol (VITAMIN D-3) 125 MCG (5000 UT) TABS Take 2 tablets by mouth daily.    [name] DALIRESP 500 MCG TABS tablet TAKE 1 TABLET BY MOUTH ONCE DAILY 30 tablet 4  . fluticasone (FLONASE) 50 MCG/ACT nasal spray INSTILL 2 SPRAYS IN EACH NOSTRIL TWICE DAILY 16 g 4  . ipratropium-albuterol (DUONEB) 0.5-2.5 (3) MG/3ML SOLN Take 3 mLs by nebulization every 4 (four) hours as needed. 360 mL 11  . OXYGEN Inhale 3 L into the lungs daily. continuous    . tadalafil (CIALIS) 20 MG tablet Take 0.5 tablets (10 mg total) by mouth daily as needed for erectile dysfunction. 10 tablet 2  . testosterone cypionate (DEPOTESTOSTERONE CYPIONATE) 200 MG/ML injection Inject 0.6 mLs (120 mg total) into the muscle once a week. 0.34ml/week 10 mL 1   Current Facility-Administered Medications  Medication Dose Route Frequency Provider Last Rate Last Admin  . testosterone cypionate (DEPOTESTOSTERONE CYPIONATE) injection 120 mg  120 mg Intramuscular Q7 days Hurshel Party C, MD   120 mg at 01/19/20 [id number]    Allergies as of 01/25/2020 - Review Complete 01/25/2020  Allergen Reaction Noted  . Apresoline [hydralazine] Other (See Comments) 07/10/2016    Family History  Problem Relation Age of Onset  . Arthritis Mother   . Hypertension Mother   . Stroke Mother        age 87  . Cerebral aneurysm Father   . Alcohol abuse Father   . Cancer Brother   . Diabetes Neg Hx   . Heart disease Neg Hx     Social History   Socioeconomic History  . Marital status: Single    Spouse name: Not on file  . Number of children: 5  . Years of education: 59  . Highest education level: Not on file  Occupational History  . Occupation: Unemployed     Comment: car wash details  Tobacco Use  . Smoking status: Former Smoker    Packs/day: 1.00    Years: 38.00    Pack years: 38.00    Types: Cigarettes    Quit date: 10/24/2019    Years since quitting: 0.2  .  Smokeless tobacco: Never Used  Substance and Sexual Activity  . Alcohol use: Not Currently    Alcohol/week: 1.0 standard drinks    Types: 1 Cans of beer per week    Comment: occassionally: weekends, 6-pack or less on a day; or half pint of liquior  . Drug use: Yes    Types: Marijuana, Cocaine    Comment: 01/25/20-marijuana few weeks ago, no cocaine  . Sexual activity: Never  Other Topics Concern  . Not on file  Social History Narrative    Lives alone.    5 daughters 35, 25 yo twins, eldest 2 are married live in [place]. Retired.Girlfriend works in Armed forces logistics/support/administrative officer.   Social Determinants of Health   Financial Resource Strain:   . Difficulty of Paying Living Expenses:   Food Insecurity:   . Worried About  Running Out of Food in the Last Year:   . [place] in the Last Year:   Transportation Needs:   . Lack of Transportation (Medical):   [name] Lack of Transportation (Non-Medical):   Physical Activity:   . Days of Exercise per Week:   . Minutes of Exercise per Session:   Stress:   . Feeling of Stress :   Social Connections:   . Frequency of Communication with Friends and Family:   . Frequency of Social Gatherings with Friends and Family:   . Attends Religious Services:   . Active Member of Clubs or Organizations:   . Attends Archivist Meetings:   [name] Marital Status:     Review of Systems  Constitutional: Negative for chills, fever, malaise/fatigue and weight loss.  HENT: Negative for congestion and sore throat.   Respiratory: Positive for shortness of breath and wheezing. Negative for cough.        Baseline COPD  Cardiovascular: Negative for chest pain and palpitations.  Gastrointestinal: Positive for heartburn. Negative for abdominal pain, blood in stool, diarrhea, melena, nausea and vomiting.  Musculoskeletal: Negative for joint pain and myalgias.  Skin: Negative for rash.  Neurological: Negative for dizziness and weakness.  Endo/Heme/Allergies: Does not  bruise/bleed easily.  Psychiatric/Behavioral: Negative for depression. The patient is not nervous/anxious.   All other systems reviewed and are negative.    VS: BP (!) 145/93   Pulse 94   Temp (!) 97.2 F (36.2 C) (Temporal)   Ht 5\' 7"  (1.702 m)   Wt 252 lb (114.3 kg)   BMI 39.47 kg/m  Physical Exam Vitals and nursing note reviewed.  Constitutional:      General: He is not in acute distress.    Appearance: Normal appearance. He is not ill-appearing, toxic-appearing or diaphoretic.  HENT:     Head: Normocephalic and atraumatic.     Nose: No congestion or rhinorrhea.  Eyes:     General: No scleral icterus. Cardiovascular:     Rate and Rhythm: Normal rate and regular rhythm.     Heart sounds: Normal heart sounds.  Pulmonary:     Effort: Pulmonary effort is normal.     Breath sounds: Normal breath sounds.     Comments: On O2 at 2lpm via nasal cannula Abdominal:     General: Abdomen is protuberant. Bowel sounds are normal. There is no distension.     Palpations: Abdomen is soft. There is no hepatomegaly, splenomegaly or mass.     Tenderness: There is no abdominal tenderness. There is no guarding or rebound.     Hernia: No hernia is present.  Musculoskeletal:     Cervical back: Neck supple.  Skin:    General: Skin is warm and dry.     Coloration: Skin is not jaundiced.     Findings: No bruising or rash.  Neurological:     General: No focal deficit present.     Mental Status: He is alert and oriented to person, place, and time. Mental status is at baseline.  Psychiatric:        Mood and Affect: Mood normal.        Behavior: Behavior normal.        Thought Content: Thought content normal.       01/25/2020 2:39 PM   Disclaimer: This note was dictated with voice recognition software. Similar sounding words can inadvertently be transcribed and may not be corrected upon review.

## 2020-01-26 ENCOUNTER — Ambulatory Visit (INDEPENDENT_AMBULATORY_CARE_PROVIDER_SITE_OTHER): Payer: Medicare Other

## 2020-01-26 ENCOUNTER — Encounter: Payer: Self-pay | Admitting: Internal Medicine

## 2020-01-26 DIAGNOSIS — N5201 Erectile dysfunction due to arterial insufficiency: Secondary | ICD-10-CM

## 2020-01-26 DIAGNOSIS — E291 Testicular hypofunction: Secondary | ICD-10-CM | POA: Diagnosis not present

## 2020-02-02 ENCOUNTER — Ambulatory Visit (INDEPENDENT_AMBULATORY_CARE_PROVIDER_SITE_OTHER): Payer: Medicare Other

## 2020-02-02 ENCOUNTER — Other Ambulatory Visit: Payer: Self-pay

## 2020-02-02 ENCOUNTER — Encounter (INDEPENDENT_AMBULATORY_CARE_PROVIDER_SITE_OTHER): Payer: Self-pay | Admitting: Otolaryngology

## 2020-02-02 ENCOUNTER — Ambulatory Visit (INDEPENDENT_AMBULATORY_CARE_PROVIDER_SITE_OTHER): Payer: Medicare Other | Admitting: Otolaryngology

## 2020-02-02 VITALS — Temp 97.9°F

## 2020-02-02 DIAGNOSIS — J310 Chronic rhinitis: Secondary | ICD-10-CM | POA: Diagnosis not present

## 2020-02-02 DIAGNOSIS — E291 Testicular hypofunction: Secondary | ICD-10-CM | POA: Diagnosis not present

## 2020-02-02 DIAGNOSIS — R590 Localized enlarged lymph nodes: Secondary | ICD-10-CM

## 2020-02-02 DIAGNOSIS — N5201 Erectile dysfunction due to arterial insufficiency: Secondary | ICD-10-CM

## 2020-02-02 NOTE — Progress Notes (Signed)
HPI: Ricardo Burch is a 59 y.o. male who presents is referred by Dr. Anastasio Champion for evaluation of intermittent nasal obstruction which is generally worse on the right side.  This occurs mostly at night.  He has long history of COPD and uses nasal cannula O2 during the daytime.  He also has sleep apnea and uses CPAP at night.  He states that his right nasal passageway seems to stop it more than his left.  He is breathing fairly well today in the office.  He also complains of a lot of postnasal drainage.  He uses Flonase intermittently. He has had no sore throat.  No hoarseness.  He was concerned about lymph nodes in his neck and he recently noticed a Berendt nodule in the lower neck toward the left shoulder..  Past Medical History:  Diagnosis Date  . Allergy   . CHF (congestive heart failure) ([place])   . Chronic kidney disease   . COPD (chronic obstructive pulmonary disease) ([place]) Dx 2015  . Depression   . Eczema    since childhood   . Erectile dysfunction 08/18/2019  . GERD (gastroesophageal reflux disease) 01/25/2020  . Hyperlipidemia 01/12/2018  . Hypertension   . Oxygen deficiency   . Sleep apnea    CPAP  . Substance abuse ([place])    MJ, cocaine  . Testicular failure 07/22/2019   Past Surgical History:  Procedure Laterality Date  . COLONOSCOPY WITH PROPOFOL N/A 06/25/2017   Procedure: COLONOSCOPY WITH PROPOFOL;  Surgeon: Daneil Dolin, MD;  [facility];  Service: Endoscopy;  Laterality: N/A;  2:00pm  . MULTIPLE EXTRACTIONS WITH ALVEOLOPLASTY N/A 03/22/2016   Procedure: MULTIPLE EXTRACTION WITH ALVEOLOPLASTY;  Surgeon: Diona Browner, DDS;  Location: [facility];  Service: Oral Surgery;  Laterality: N/A;  . NO PAST SURGERIES    . POLYPECTOMY  06/25/2017   Procedure: POLYPECTOMY;  Surgeon: Daneil Dolin, MD;  [facility];  Service: Endoscopy;;  colon   Social History   Socioeconomic History  . Marital status: Single    Spouse name: Not on file  . Number of children: 5  .  Years of education: 96  . Highest education level: Not on file  Occupational History  . Occupation: Unemployed     Comment: car wash details  Tobacco Use  . Smoking status: Former Smoker    Packs/day: 1.00    Years: 38.00    Pack years: 38.00    Types: Cigarettes    Quit date: 10/24/2019    Years since quitting: 0.2  . Smokeless tobacco: Never Used  Substance and Sexual Activity  . Alcohol use: Not Currently    Alcohol/week: 1.0 standard drinks    Types: 1 Cans of beer per week    Comment: Socially x 1/month currently (as of 01/25/20); previously: occassionally: weekends, 6-pack or less on a day; or half pint of liquior  . Drug use: Yes    Types: Marijuana, Cocaine    Comment: 01/25/20-marijuana few weeks ago, no cocaine  . Sexual activity: Never  Other Topics Concern  . Not on file  Social History Narrative    Lives alone.    5 daughters 34, 15 yo twins, eldest 2 are married live in [place]. Retired.Girlfriend works in Armed forces logistics/support/administrative officer.   Social Determinants of Health   Financial Resource Strain:   . Difficulty of Paying Living Expenses:   Food Insecurity:   . Worried About Charity fundraiser in the Last Year:   .  Ran Out of Food in the Last Year:   Transportation Needs:   . Film/video editor (Medical):   [name] Lack of Transportation (Non-Medical):   Physical Activity:   . Days of Exercise per Week:   . Minutes of Exercise per Session:   Stress:   . Feeling of Stress :   Social Connections:   . Frequency of Communication with Friends and Family:   . Frequency of Social Gatherings with Friends and Family:   . Attends Religious Services:   . Active Member of Clubs or Organizations:   . Attends Archivist Meetings:   [name] Marital Status:    Family History  Problem Relation Age of Onset  . Arthritis Mother   . Hypertension Mother   . Stroke Mother        age 78  . Cerebral aneurysm Father   . Alcohol abuse Father   . Cancer Brother   . Diabetes Neg Hx    . Heart disease Neg Hx    Allergies  Allergen Reactions  . Apresoline [Hydralazine] Other (See Comments)    Headache    Prior to Admission medications   Medication Sig Start Date End Date Taking? Authorizing Provider  albuterol (VENTOLIN HFA) 108 (90 Base) MCG/ACT inhaler INHALE 1 OR 2 PUFFS BY MOUTH EVERY 6 HOURS AS NEEDED FOR WHEEZING OR SHORTNESS OF BREATH 07/27/19  Yes Mannam, Praveen, MD  amLODipine (NORVASC) 5 MG tablet TAKE 1 TABLET BY MOUTH EVERY DAY 10/19/19  Yes Gosrani, Nimish C, MD  Budeson-Glycopyrrol-Formoterol (BREZTRI AEROSPHERE) 160-9-4.8 MCG/ACT AERO Inhale 2 puffs into the lungs 2 (two) times daily. Patient taking differently: Inhale 1 puff into the lungs 2 (two) times daily.  12/06/19  Yes Mannam, Praveen, MD  Cholecalciferol (VITAMIN D-3) 125 MCG (5000 UT) TABS Take 2 tablets by mouth daily.   Yes [provider]  DALIRESP 500 MCG TABS tablet TAKE 1 TABLET BY MOUTH ONCE DAILY 07/27/19  Yes Mannam, Praveen, MD  fluticasone (FLONASE) 50 MCG/ACT nasal spray INSTILL 2 SPRAYS IN EACH NOSTRIL TWICE DAILY 07/27/19  Yes Mannam, Praveen, MD  ipratropium-albuterol (DUONEB) 0.5-2.5 (3) MG/3ML SOLN Take 3 mLs by nebulization every 4 (four) hours as needed. 12/06/19  Yes Mannam, Praveen, MD  OXYGEN Inhale 3 L into the lungs daily. continuous   Yes [provider]  pantoprazole (PROTONIX) 40 MG tablet Take 1 tablet (40 mg total) by mouth daily. 01/25/20  Yes Carlis Stable, NP  tadalafil (CIALIS) 20 MG tablet Take 0.5 tablets (10 mg total) by mouth daily as needed for erectile dysfunction. 01/06/20  Yes Gosrani, Nimish C, MD  testosterone cypionate (DEPOTESTOSTERONE CYPIONATE) 200 MG/ML injection Inject 0.6 mLs (120 mg total) into the muscle once a week. 0.70ml/week 09/15/19  Yes Gosrani, Nimish C, MD  amLODipine (NORVASC) 5 MG tablet Take 1 tablet (5 mg total) by mouth daily. 08/11/19   Doree Albee, MD     Positive ROS: Otherwise negative  All other systems have been  reviewed and were otherwise negative with the exception of those mentioned in the HPI and as above.  Physical Exam: Constitutional: Alert, well-appearing, no acute distress.  Uses nasal cannula O2. Ears: External ears without lesions or tenderness. Ear canals are clear bilaterally with intact, clear TMs.  Nasal: External nose without lesions. Septum is minimally deviated to the right..  Mucus within the nasal cavity is clear.  Nasal endoscopy was performed in the office today and on nasal endoscopy the middle meatus was clear  on both sides posterior ethmoid sphenoid region was clear with no active drainage noted.  Only mucus within the nasal cavity was clear with no drainage from the maxillary ethmoid or sphenoid regions.  He had moderate rhinitis that was decongested using Afrin. Oral: Lips and gums without lesions. Tongue and palate mucosa without lesions. Posterior oropharynx clear.  Tonsils are symmetric bilaterally. Indirect laryngoscopy revealed clear base of tongue vallecula and epiglottis.  He has slight vocal cord edema but no vocal cord lesions noted with normal vocal cord mobility bilaterally. Neck: No palpable adenopathy along the jugular chain of lymph nodes.  No palpable thyroid mass or nodule.  Of note the nodule patient feels in the left neck is an approximate 2 cm left lower lateral supraclavicular lymph node adjacent to the lateral aspect of the clavicle.  This is slightly tender to palpation and is consistent with a reactive lymph node. Respiratory: Breathing comfortably  Skin: No facial/neck lesions or rash noted.  Nasal/sinus endoscopy  Date/Time: 02/02/2020 12:05 PM Performed by: Rozetta Nunnery, MD Authorized by: Rozetta Nunnery, MD   Consent:    Consent obtained:  Verbal   Consent given by:  Patient   Risks discussed:  Pain Procedure details:    Indications: sino-nasal symptoms     Medication:  Afrin   Scope location: bilateral nare   Sinus:    Right  middle meatus: normal     Left middle meatus: normal     Right nasopharynx: normal     Left nasopharynx: normal   Comments:     On nasal endoscopy there were no polyps and no signs of infection with mostly clear mucus discharge.  Moderate rhinitis.    Assessment: Chronic rhinitis Clinically no evidence of active sinus infection Lower left lateral supraclavicular lymph node most likely reactive.  Plan: Recommended regular use of Flonase 2 sprays each nostril at night as this will provide more relief from chronic rhinitis if he is used on a regular basis.  There are no structural abnormalities noted within the nasal cavity and no signs of infection.  Also discussed use of saline nasal irrigation. Concerning the left supraclavicular lymph node recommended just observation but it is consistent with a reactive node.  If it continues to enlarge he will follow-up as needed.   Radene Journey, MD   CC:

## 2020-02-09 ENCOUNTER — Ambulatory Visit (INDEPENDENT_AMBULATORY_CARE_PROVIDER_SITE_OTHER): Payer: Medicare Other

## 2020-02-10 ENCOUNTER — Other Ambulatory Visit: Payer: Self-pay

## 2020-02-10 ENCOUNTER — Ambulatory Visit (INDEPENDENT_AMBULATORY_CARE_PROVIDER_SITE_OTHER): Payer: Medicare Other

## 2020-02-10 DIAGNOSIS — N5201 Erectile dysfunction due to arterial insufficiency: Secondary | ICD-10-CM

## 2020-02-10 DIAGNOSIS — E291 Testicular hypofunction: Secondary | ICD-10-CM | POA: Diagnosis not present

## 2020-02-14 ENCOUNTER — Encounter (HOSPITAL_COMMUNITY)
Admission: RE | Admit: 2020-02-14 | Discharge: 2020-02-14 | Disposition: A | Discharge status: Home / Self Care | Payer: Medicare Other | Source: Ambulatory Visit | Attending: Cardiovascular Disease | Admitting: Cardiovascular Disease

## 2020-02-14 ENCOUNTER — Other Ambulatory Visit: Payer: Self-pay

## 2020-02-14 VITALS — BP 130/90 | HR 83 | Temp 97.9°F | Ht 67.0 in | Wt 249.6 lb

## 2020-02-14 DIAGNOSIS — I5032 Chronic diastolic (congestive) heart failure: Secondary | ICD-10-CM

## 2020-02-14 NOTE — Progress Notes (Signed)
Daily Session Note  Patient Details  Name: Ricardo Burch MRN: [id number] Date of Birth: Mar 20, 1961 Referring Provider:     PULMONARY REHAB OTHER RESP ORIENTATION from 02/14/2020 in [place]  Referring Provider  Dr. [name]      Encounter Date: 02/14/2020  Check In:   Capillary Blood Glucose: No results found for this or any previous visit (from the past 24 hour(s)).    Social History   Tobacco Use  Smoking Status Former Smoker  . Packs/day: 1.00  . Years: 38.00  . Pack years: 38.00  . Types: Cigarettes  . Quit date: 10/24/2019  . Years since quitting: 0.3  Smokeless Tobacco Never Used    Goals Met:  Proper associated with RPD/PD & O2 Sat Independence with exercise equipment Improved SOB with ADL's Using PLB without cueing & demonstrates good technique Exercise tolerated well Personal goals reviewed No report of cardiac concerns or symptoms Strength training completed today  Goals Unmet:  Not Applicable  Comments: Check out: 1030   Dr. Kate Sable is Medical Director for [facility] and Pulmonary Rehab.

## 2020-02-14 NOTE — Progress Notes (Signed)
Cardiac/Pulmonary Rehab Medication Review by a Pharmacist  Does the patient  feel that his/her medications are working for him/her?  yes  Has the patient been experiencing any side effects to the medications prescribed?  no  Does the patient measure his/her own blood pressure or blood glucose at home?  yes   Does the patient have any problems obtaining medications due to transportation or finances?   no  Understanding of regimen: good Understanding of indications: good Potential of compliance: good  Questions asked to Determine Patient Understanding of Medication Regimen:  1. What is the name of the medication?  2. What is the medication used for?  3. When should it be taken?  4. How much should be taken?  5. How will you take it?  6. What side effects should you report?  Understanding Defined as: Excellent: All questions above are correct Good: Questions 1-4 are correct Fair: Questions 1-2 are correct  Poor: 1 or none of the above questions are correct   Pharmacist comments: Overall, patient has good understanding of medications and high compliance.  Did not have medication list with him but knew regiment so able to go through with him.  No further questions.    Margot Ables, PharmD Clinical Pharmacist 02/14/2020 8:51 AM

## 2020-02-14 NOTE — Progress Notes (Signed)
Pulmonary Individual Treatment Plan  Patient Details  Name: Ricardo Burch MRN: [id number] Date of Birth: 1961-08-31 Referring Provider:     PULMONARY REHAB OTHER RESP ORIENTATION from 02/14/2020 in [facility] [place]  Referring Provider  Dr. [name]      Initial Encounter Date:    PULMONARY [place] from 02/14/2020 in [facility]  Date  02/14/20      Visit Diagnosis: Chronic diastolic CHF (congestive heart failure) (HCC)  Patient's Home Medications on Admission:   Current Outpatient Medications:  .  albuterol (VENTOLIN HFA) 108 (90 Base) MCG/ACT inhaler, INHALE 1 OR 2 PUFFS BY MOUTH EVERY 6 HOURS AS NEEDED FOR WHEEZING OR SHORTNESS OF BREATH, Disp: 18 g, Rfl: 4 .  amLODipine (NORVASC) 5 MG tablet, TAKE 1 TABLET BY MOUTH EVERY DAY, Disp: 90 tablet, Rfl: 0 .  Budeson-Glycopyrrol-Formoterol (BREZTRI AEROSPHERE) 160-9-4.8 MCG/ACT AERO, Inhale 2 puffs into the lungs 2 (two) times daily., Disp: 10.7 g, Rfl: 3 .  Cholecalciferol (VITAMIN D-3) 125 MCG (5000 UT) TABS, Take 2 tablets by mouth daily., Disp: , Rfl:  .  DALIRESP 500 MCG TABS tablet, TAKE 1 TABLET BY MOUTH ONCE DAILY, Disp: 30 tablet, Rfl: 4 .  fluticasone (FLONASE) 50 MCG/ACT nasal spray, INSTILL 2 SPRAYS IN EACH NOSTRIL TWICE DAILY, Disp: 16 g, Rfl: 4 .  ipratropium-albuterol (DUONEB) 0.5-2.5 (3) MG/3ML SOLN, Take 3 mLs by nebulization every 4 (four) hours as needed., Disp: 360 mL, Rfl: 11 .  OXYGEN, Inhale 3 L into the lungs daily. continuous, Disp: , Rfl:  .  pantoprazole (PROTONIX) 40 MG tablet, Take 1 tablet (40 mg total) by mouth daily., Disp: 90 tablet, Rfl: 3 .  tadalafil (CIALIS) 20 MG tablet, Take 0.5 tablets (10 mg total) by mouth daily as needed for erectile dysfunction., Disp: 10 tablet, Rfl: 2 .  testosterone cypionate (DEPOTESTOSTERONE CYPIONATE) 200 MG/ML injection, Inject 0.6 mLs (120 mg total) into the muscle once a week. 0.67ml/week, Disp: 10 mL, Rfl:  1  Current Facility-Administered Medications:  .  testosterone cypionate (DEPOTESTOSTERONE CYPIONATE) injection 120 mg, 120 mg, Intramuscular, Q7 days, Gosrani, Nimish C, MD, 120 mg at 02/10/20 1030  Past Medical History: Past Medical History:  Diagnosis Date  . Allergy   . CHF (congestive heart failure) ([place])   . Chronic kidney disease   . COPD (chronic obstructive pulmonary disease) ([place]) Dx 2015  . Depression   . Eczema    since childhood   . Erectile dysfunction 08/18/2019  . GERD (gastroesophageal reflux disease) 01/25/2020  . Hyperlipidemia 01/12/2018  . Hypertension   . Oxygen deficiency   . Sleep apnea    CPAP  . Substance abuse ([place])    MJ, cocaine  . Testicular failure 07/22/2019    Tobacco Use: Social History   Tobacco Use  Smoking Status Former Smoker  . Packs/day: 1.00  . Years: 38.00  . Pack years: 38.00  . Types: Cigarettes  . Quit date: 10/24/2019  . Years since quitting: 0.3  Smokeless Tobacco Never Used    Labs: Recent Review Flowsheet Data    Labs for ITP Cardiac and Pulmonary Rehab Latest Ref Rng & Units 07/27/2016 10/14/2017 09/23/2018 09/24/2018 07/22/2019   Cholestrol <200 mg/dL - 136 - 91 122   LDLCALC mg/dL (calc) - 60 - 49 68   HDL > OR = 40 mg/dL - 60 - 33(L) 40   Trlycerides <150 mg/dL - 82 - 43 67   Hemoglobin A1c 4.8 - 5.6 % - 5.3  5.5 - -   PHART 7.350 - 7.450 7.341(L) - - - -   PCO2ART 32.0 - 48.0 mmHg 84.2(HH) - - - -   HCO3 20.0 - 28.0 mmol/L 44.3(H) - - - -   TCO2 0 - 100 mmol/L - - - - -   O2SAT % 92.0 - - - -      Capillary Blood Glucose: Lab Results  Component Value Date   GLUCAP 84 11/20/2019   GLUCAP 93 05/12/2018   GLUCAP 179 (H) 07/27/2016   GLUCAP 156 (H) 07/27/2016   GLUCAP 140 (H) 09/16/2015     Pulmonary Assessment Scores: Pulmonary Assessment Scores    Row Name 02/14/20 1338         ADL UCSD   ADL Phase  Entry     SOB Score total  57     Rest  0     Walk  7     Stairs  4     Bath  3     Dress  2      Shop  3       CAT Score   CAT Score  17       mMRC Score   mMRC Score  4       UCSD: Self-administered rating of dyspnea associated with activities of daily living (ADLs) 6-point scale (0 = "not at all" to 5 = "maximal or unable to do because of breathlessness")  Scoring Scores range from 0 to 120.  Minimally important difference is 5 units  CAT: CAT can identify the health impairment of COPD patients and is better correlated with disease progression.  CAT has a scoring range of zero to 40. The CAT score is classified into four groups of low (less than 10), medium (10 - 20), high (21-30) and very high (31-40) based on the impact level of disease on health status. A CAT score over 10 suggests significant symptoms.  A worsening CAT score could be explained by an exacerbation, poor medication adherence, poor inhaler technique, or progression of COPD or comorbid conditions.  CAT MCID is 2 points  mMRC: mMRC (Modified Medical Research Council) Dyspnea Scale is used to assess the degree of baseline functional disability in patients of respiratory disease due to dyspnea. No minimal important difference is established. A decrease in score of 1 point or greater is considered a positive change.   Pulmonary Function Assessment:   Exercise Target Goals: Exercise Program Goal: Individual exercise prescription set using results from initial 6 min walk test and THRR while considering  patient's activity barriers and safety.   Exercise Prescription Goal: Initial exercise prescription builds to 30-45 minutes a day of aerobic activity, 2-3 days per week.  Home exercise guidelines will be given to patient during program as part of exercise prescription that the participant will acknowledge.  Activity Barriers & Risk Stratification: Activity Barriers & Cardiac Risk Stratification - 02/14/20 1251      Activity Barriers & Cardiac Risk Stratification   Activity Barriers  Deconditioning;Shortness of  Breath    Cardiac Risk Stratification  Moderate       6 Minute Walk: 6 Minute Walk    Row Name 02/14/20 1247         6 Minute Walk   Phase  Initial     Distance  800 feet     Walk Time  6 minutes     # of Rest Breaks  0     MPH  1.51  METS  2.16     RPE  12     Perceived Dyspnea   14     VO2 Peak  7.83     Symptoms  Yes (comment)     Comments  SOB     Resting HR  83 bpm     Resting BP  130/90     Resting Oxygen Saturation   96 %     Exercise Oxygen Saturation  during 6 min walk  89 %     Max Ex. HR  99 bpm     Max Ex. BP  148/92     2 Minute Post BP  120/88        Oxygen Initial Assessment: Oxygen Initial Assessment - 02/14/20 1336      Home Oxygen   Home Oxygen Device  Home Concentrator    Sleep Oxygen Prescription  CPAP    Liters per minute  3    Home Exercise Oxygen Prescription  Continuous    Liters per minute  2    Home at Rest Exercise Oxygen Prescription  Continuous    Liters per minute  3    Compliance with Home Oxygen Use  Yes      Initial 6 min Walk   Oxygen Used  Continuous    Liters per minute  4      Program Oxygen Prescription   Program Oxygen Prescription  Continuous    Liters per minute  4      Intervention   Short Term Goals  To learn and exhibit compliance with exercise, home and travel O2 prescription;To learn and understand importance of monitoring SPO2 with pulse oximeter and demonstrate accurate use of the pulse oximeter.;To learn and understand importance of maintaining oxygen saturations>88%    Long  Term Goals  Exhibits compliance with exercise, home and travel O2 prescription;Verbalizes importance of monitoring SPO2 with pulse oximeter and return demonstration;Maintenance of O2 saturations>88%;Exhibits proper breathing techniques, such as pursed lip breathing or other method taught during program session       Oxygen Re-Evaluation:   Oxygen Discharge (Final Oxygen Re-Evaluation):   Initial Exercise Prescription: Initial  Exercise Prescription - 02/14/20 1200      Date of Initial Exercise RX and Referring Provider   Date  02/14/20    Referring Provider  Dr. [name]    Expected Discharge Date  05/15/20      Oxygen   Oxygen  Continuous    Liters  3      Treadmill   MPH  1.2    Grade  0    Minutes  17    METs  1.91      Recumbant Elliptical   Level  1    RPM  33    Watts  30    Minutes  22    METs  1.6      Prescription Details   Frequency (times per week)  2    Duration  Progress to 30 minutes of continuous aerobic without signs/symptoms of physical distress      Intensity   THRR 40-80% of Max Heartrate  [phone]    Ratings of Perceived Exertion  11-13    Perceived Dyspnea  0-4      Progression   Progression  Continue to progress workloads to maintain intensity without signs/symptoms of physical distress.      Resistance Training   Training Prescription  Yes    Weight  1    Reps  10-15       Perform Capillary Blood Glucose checks as needed.  Exercise Prescription Changes:   Exercise Comments:  Exercise Comments    Row Name 02/14/20 1306           Exercise Comments  This is the patients assessment/orientation visit. The patient has been in the program before and did well. He is eagar to get started          Exercise Goals and Review:  Exercise Goals    Row Name 02/14/20 1302             Exercise Goals   Increase Physical Activity  Yes       Intervention  Provide advice, education, support and counseling about physical activity/exercise needs.;Develop an individualized exercise prescription for aerobic and resistive training based on initial evaluation findings, risk stratification, comorbidities and participant's personal goals.       Expected Outcomes  Short Term: Attend rehab on a regular basis to increase amount of physical activity.;Long Term: Add in home exercise to make exercise part of routine and to increase amount of physical activity.       Increase  Strength and Stamina  Yes       Intervention  Provide advice, education, support and counseling about physical activity/exercise needs.;Develop an individualized exercise prescription for aerobic and resistive training based on initial evaluation findings, risk stratification, comorbidities and participant's personal goals.       Expected Outcomes  Short Term: Perform resistance training exercises routinely during rehab and add in resistance training at home;Long Term: Improve cardiorespiratory fitness, muscular endurance and strength as measured by increased METs and functional capacity (6MWT)       Able to understand and use rate of perceived exertion (RPE) scale  Yes       Intervention  Provide education and explanation on how to use RPE scale       Expected Outcomes  Short Term: Able to use RPE daily in rehab to express subjective intensity level;Long Term:  Able to use RPE to guide intensity level when exercising independently       Able to understand and use Dyspnea scale  Yes       Intervention  Provide education and explanation on how to use Dyspnea scale       Expected Outcomes  Short Term: Able to use Dyspnea scale daily in rehab to express subjective sense of shortness of breath during exertion;Long Term: Able to use Dyspnea scale to guide intensity level when exercising independently       Knowledge and understanding of Target Heart Rate Range (THRR)  Yes       Intervention  Provide education and explanation of THRR including how the numbers were predicted and where they are located for reference       Expected Outcomes  Short Term: Able to use daily as guideline for intensity in rehab;Long Term: Able to use THRR to govern intensity when exercising independently       Able to check pulse independently  Yes       Intervention  Provide education and demonstration on how to check pulse in carotid and radial arteries.;Review the importance of being able to check your own pulse for safety during  independent exercise       Expected Outcomes  Short Term: Able to explain why pulse checking is important during independent exercise;Long Term: Able to check pulse independently and accurately       Understanding of Exercise  Prescription  Yes       Intervention  Provide education, explanation, and written materials on patient's individual exercise prescription       Expected Outcomes  Short Term: Able to explain program exercise prescription;Long Term: Able to explain home exercise prescription to exercise independently          Exercise Goals Re-Evaluation :   Discharge Exercise Prescription (Final Exercise Prescription Changes):   Nutrition:  Target Goals: Understanding of nutrition guidelines, daily intake of sodium 1500mg , cholesterol 200mg , calories 30% from fat and 7% or less from saturated fats, daily to have 5 or more servings of fruits and vegetables.  Biometrics: Pre Biometrics - 02/14/20 1310      Pre Biometrics   Height  5\' 7"  (1.702 m)    Weight  249 lb 9.6 oz (113.2 kg)    Waist Circumference  45 inches    Hip Circumference  41.5 inches    Waist to Hip Ratio  1.08 %    BMI (Calculated)  39.08    Triceps Skinfold  6 mm    % Body Fat  30.3 %    Grip Strength  50.9 kg    Flexibility  0 in    Single Leg Stand  4.73 seconds        Nutrition Therapy Plan and Nutrition Goals:   Nutrition Assessments:   Nutrition Goals Re-Evaluation:   Nutrition Goals Discharge (Final Nutrition Goals Re-Evaluation):   Psychosocial: Target Goals: Acknowledge presence or absence of significant depression and/or stress, maximize coping skills, provide positive support system. Participant is able to verbalize types and ability to use techniques and skills needed for reducing stress and depression.  Initial Review & Psychosocial Screening:   Quality of Life Scores: Quality of Life - 02/14/20 1312      Quality of Life   Select  Quality of Life      Quality of Life Scores    Health/Function Pre  20.81 %    Socioeconomic Pre  20.5 %    Psych/Spiritual Pre  21 %    Family Pre  20.4 %    GLOBAL Pre  20.72 %      Scores of 19 and below usually indicate a poorer quality of life in these areas.  A difference of  2-3 points is a clinically meaningful difference.  A difference of 2-3 points in the total score of the Quality of Life Index has been associated with significant improvement in overall quality of life, self-image, physical symptoms, and general health in studies assessing change in quality of life.   PHQ-9: Recent Review Flowsheet Data    Depression screen [facility] 2/9 02/14/2020 01/12/2020 05/26/2018 04/29/2018 02/05/2018   Decreased Interest 0 0 0 0 0   Down, Depressed, Hopeless 1 0 0 0 0   PHQ - 2 Score 1 0 0 0 0   Altered sleeping 1 - - 0 -   Tired, decreased energy 1 - - - -   Change in appetite 0 - - 0 -   Feeling bad or failure about yourself  1 - - 1 -   Trouble concentrating 1 - - 0 -   Moving slowly or fidgety/restless 0 - - 0 -   Suicidal thoughts 0 - - 0 -   PHQ-9 Score 5 - - 1 -   Difficult doing work/chores Somewhat difficult - - - -     Interpretation of Total Score  Total Score Depression Severity:  1-4 =  Minimal depression, 5-9 = Mild depression, 10-14 = Moderate depression, 15-19 = Moderately severe depression, 20-27 = Severe depression   Psychosocial Evaluation and Intervention:   Psychosocial Re-Evaluation:   Psychosocial Discharge (Final Psychosocial Re-Evaluation):    Education: Education Goals: Education classes will be provided on a weekly basis, covering required topics. Participant will state understanding/return demonstration of topics presented.  Learning Barriers/Preferences:   Education Topics: How Lungs Work and Diseases: - Discuss the anatomy of the lungs and diseases that can affect the lungs, such as COPD.   PULMONARY REHAB CHRONIC OBSTRUCTIVE PULMONARY DISEASE from 04/23/2018 in [place]  Date  03/12/18  Educator  D. Shawn Dannenberg  Instruction Review Code  2- Demonstrated Understanding      Exercise: -Discuss the importance of exercise, FITT principles of exercise, normal and abnormal responses to exercise, and how to exercise safely.   PULMONARY REHAB CHRONIC OBSTRUCTIVE PULMONARY DISEASE from 04/23/2018 in [place]  Date  03/05/18  Educator  [place]  Instruction Review Code  2- Demonstrated Understanding      Environmental Irritants: -Discuss types of environmental irritants and how to limit exposure to environmental irritants.   Meds/Inhalers and oxygen: - Discuss respiratory medications, definition of an inhaler and oxygen, and the proper way to use an inhaler and oxygen.   PULMONARY REHAB CHRONIC OBSTRUCTIVE PULMONARY DISEASE from 04/23/2018 in [place]  Date  03/26/18  Educator  DC      Energy Saving Techniques: - Discuss methods to conserve energy and decrease shortness of breath when performing activities of daily living.    PULMONARY REHAB CHRONIC OBSTRUCTIVE PULMONARY DISEASE from 04/23/2018 in [place]  Date  04/02/18  Educator  [place]  Instruction Review Code  2- Demonstrated Understanding      Bronchial Hygiene / Breathing Techniques: - Discuss breathing mechanics, pursed-lip breathing technique,  proper posture, effective ways to clear airways, and other functional breathing techniques   PULMONARY REHAB CHRONIC OBSTRUCTIVE PULMONARY DISEASE from 04/23/2018 in [place]  Date  04/09/18  Educator  DC  Instruction Review Code  2- Demonstrated Understanding      Cleaning Equipment: - Provides group verbal and written instruction about the health risks of elevated stress, cause of high stress, and healthy ways to reduce stress.   PULMONARY REHAB CHRONIC OBSTRUCTIVE PULMONARY DISEASE from 04/23/2018 in [place]  Date  01/15/18   Educator  [place]  Instruction Review Code  2- Demonstrated Understanding      Nutrition I: Fats: - Discuss the types of cholesterol, what cholesterol does to the body, and how cholesterol levels can be controlled.   PULMONARY REHAB CHRONIC OBSTRUCTIVE PULMONARY DISEASE from 04/23/2018 in [place]  Date  01/22/18  Educator  [place]  Instruction Review Code  2- Demonstrated Understanding      Nutrition II: Labels: -Discuss the different components of food labels and how to read food labels.   PULMONARY REHAB CHRONIC OBSTRUCTIVE PULMONARY DISEASE from 04/23/2018 in [place]  Date  01/29/18  Educator  DJ  Instruction Review Code  2- Demonstrated Understanding      Respiratory Infections: - Discuss the signs and symptoms of respiratory infections, ways to prevent respiratory infections, and the importance of seeking medical treatment when having a respiratory infection.   PULMONARY REHAB CHRONIC OBSTRUCTIVE PULMONARY DISEASE from 04/23/2018 in [place]  Date  02/05/18  Educator  [place]  Instruction Review Code  2- Demonstrated Understanding      Stress I: Signs and Symptoms: - Discuss the causes of stress, how stress may lead to anxiety and depression, and ways to limit stress.   Stress II: Relaxation: -Discuss relaxation techniques to limit stress.   PULMONARY REHAB CHRONIC OBSTRUCTIVE PULMONARY DISEASE from 04/23/2018 in [place]  Date  02/19/18  Educator  [place]  Instruction Review Code  2- Demonstrated Understanding      Oxygen for Home/Travel: - Discuss how to prepare for travel when on oxygen and proper ways to transport and store oxygen to ensure safety.   Knowledge Questionnaire Score:   Core Components/Risk Factors/Patient Goals at Admission:   Core Components/Risk Factors/Patient Goals Review:    Core Components/Risk Factors/Patient Goals at Discharge (Final Review):     ITP Comments:   Comments: Patient arrived for 1st visit/orientation/education at 0800.  Patient was referred to [facility] by Dr. [name] due to Chronic Diastolic CHF (99991111). During orientation advised patient on arrival and appointment times what to wear, what to do before, during and after exercise. Reviewed attendance and class policy. Talked about inclement weather and class consultation policy. Pt is scheduled to return Pulmonary Rehab on 02/22/20 at 2:45. Pt was advised to come to class 15 minutes before class starts. Patient was also given instructions on meeting with the dietician and attending the Family Structure classes. Discussed RPE/Dpysnea scales. Discussed initial THR and how to find their radial and/or carotid pulse. Discussed the initial exercise prescription and how this effects their progress. Pt is eager to get started. Patient participated in warm up stretches followed by light weights and resistance bands. Patient was able to complete 6 minute walk test. Patient did not c/o pain during or after the walk test. Patient used 4L of oxygen during the test. Went back to 3L after walk test at 2 minute rest. Patient was measured for the equipment. Discussed equipment safety with patient. Took patient pre-anthropometric measurements. Patient finished visit at 10:30.

## 2020-02-16 ENCOUNTER — Ambulatory Visit (INDEPENDENT_AMBULATORY_CARE_PROVIDER_SITE_OTHER): Payer: Medicare Other

## 2020-02-16 ENCOUNTER — Other Ambulatory Visit: Payer: Self-pay

## 2020-02-16 VITALS — Ht 67.0 in | Wt 250.0 lb

## 2020-02-16 DIAGNOSIS — R5383 Other fatigue: Secondary | ICD-10-CM

## 2020-02-16 DIAGNOSIS — E291 Testicular hypofunction: Secondary | ICD-10-CM | POA: Diagnosis not present

## 2020-02-16 DIAGNOSIS — N5201 Erectile dysfunction due to arterial insufficiency: Secondary | ICD-10-CM

## 2020-02-16 NOTE — Progress Notes (Signed)
Pt came today and was given 0.4 of testosterone. Pt did not have enough for the next dose. Pt will be bringing new bottles on next visit.

## 2020-02-22 ENCOUNTER — Other Ambulatory Visit: Payer: Self-pay

## 2020-02-22 ENCOUNTER — Encounter (HOSPITAL_COMMUNITY)
Admission: RE | Admit: 2020-02-22 | Discharge: 2020-02-22 | Disposition: A | Discharge status: Home / Self Care | Payer: Medicare Other | Source: Ambulatory Visit | Attending: Cardiovascular Disease | Admitting: Cardiovascular Disease

## 2020-02-22 DIAGNOSIS — I5032 Chronic diastolic (congestive) heart failure: Secondary | ICD-10-CM | POA: Diagnosis not present

## 2020-02-23 ENCOUNTER — Ambulatory Visit (INDEPENDENT_AMBULATORY_CARE_PROVIDER_SITE_OTHER): Payer: Medicare Other

## 2020-02-23 DIAGNOSIS — E291 Testicular hypofunction: Secondary | ICD-10-CM

## 2020-02-23 DIAGNOSIS — N5201 Erectile dysfunction due to arterial insufficiency: Secondary | ICD-10-CM

## 2020-02-24 ENCOUNTER — Other Ambulatory Visit: Payer: Self-pay

## 2020-02-24 ENCOUNTER — Encounter (HOSPITAL_COMMUNITY)
Admission: RE | Admit: 2020-02-24 | Discharge: 2020-02-24 | Disposition: A | Discharge status: Home / Self Care | Payer: Medicare Other | Source: Ambulatory Visit | Attending: Cardiovascular Disease | Admitting: Cardiovascular Disease

## 2020-02-24 DIAGNOSIS — I5032 Chronic diastolic (congestive) heart failure: Secondary | ICD-10-CM | POA: Diagnosis not present

## 2020-02-24 NOTE — Progress Notes (Signed)
Daily Session Note  Patient Details  Name: Ricardo Burch MRN: [id number] Date of Birth: 1961-10-06 Referring Provider:     PULMONARY REHAB OTHER RESP ORIENTATION from 02/14/2020 in [facility]  Referring Provider  Dr. [name]      Encounter Date: 02/24/2020  Check In: Session Check In - 02/24/20 1513      Check-In   Supervising physician immediately available to respond to emergencies  See telemetry face sheet for immediately available MD    Location  [facility]    Staff Present  Russella Dar, MS, EP, [facility], Exercise Physiologist;Debra Wynetta Emery, RN, BSN    Virtual Visit  No    Medication changes reported      No    Fall or balance concerns reported     No    Tobacco Cessation  No Change    Warm-up and Cool-down  Performed as group-led instruction    Resistance Training Performed  Yes    VAD Patient?  No    PAD/SET Patient?  No      Pain Assessment   Currently in Pain?  No/denies    Pain Score  0-No pain    Multiple Pain Sites  No       Capillary Blood Glucose: No results found for this or any previous visit (from the past 24 hour(s)).    Social History   Tobacco Use  Smoking Status Former Smoker  . Packs/day: 1.00  . Years: 38.00  . Pack years: 38.00  . Types: Cigarettes  . Quit date: 10/24/2019  . Years since quitting: 0.3  Smokeless Tobacco Never Used    Goals Met:  Proper associated with RPD/PD & O2 Sat Independence with exercise equipment Improved SOB with ADL's Using PLB without cueing & demonstrates good technique Exercise tolerated well Personal goals reviewed No report of cardiac concerns or symptoms Strength training completed today  Goals Unmet:  Not Applicable  Comments: Check out: 1545   Dr. Kate Sable is Medical Director for [place] and Pulmonary Rehab.

## 2020-02-24 NOTE — Progress Notes (Signed)
Pulmonary Individual Treatment Plan  Patient Details  Name: Ricardo Burch MRN: [id number] Date of Birth: 19-Apr-1961 Referring Provider:     PULMONARY REHAB OTHER RESP ORIENTATION from 02/14/2020 in [facility] [facility]  Referring Provider  Dr. [name]      Initial Encounter Date:    PULMONARY [place] from 02/14/2020 in [place]  Date  02/14/20      Visit Diagnosis: Chronic diastolic CHF (congestive heart failure) (HCC)  Patient's Home Medications on Admission:   Current Outpatient Medications:  .  albuterol (VENTOLIN HFA) 108 (90 Base) MCG/ACT inhaler, INHALE 1 OR 2 PUFFS BY MOUTH EVERY 6 HOURS AS NEEDED FOR WHEEZING OR SHORTNESS OF BREATH, Disp: 18 g, Rfl: 4 .  amLODipine (NORVASC) 5 MG tablet, TAKE 1 TABLET BY MOUTH EVERY DAY, Disp: 90 tablet, Rfl: 0 .  Budeson-Glycopyrrol-Formoterol (BREZTRI AEROSPHERE) 160-9-4.8 MCG/ACT AERO, Inhale 2 puffs into the lungs 2 (two) times daily., Disp: 10.7 g, Rfl: 3 .  Cholecalciferol (VITAMIN D-3) 125 MCG (5000 UT) TABS, Take 2 tablets by mouth daily., Disp: , Rfl:  .  DALIRESP 500 MCG TABS tablet, TAKE 1 TABLET BY MOUTH ONCE DAILY, Disp: 30 tablet, Rfl: 4 .  fluticasone (FLONASE) 50 MCG/ACT nasal spray, INSTILL 2 SPRAYS IN EACH NOSTRIL TWICE DAILY, Disp: 16 g, Rfl: 4 .  ipratropium-albuterol (DUONEB) 0.5-2.5 (3) MG/3ML SOLN, Take 3 mLs by nebulization every 4 (four) hours as needed., Disp: 360 mL, Rfl: 11 .  OXYGEN, Inhale 3 L into the lungs daily. continuous, Disp: , Rfl:  .  pantoprazole (PROTONIX) 40 MG tablet, Take 1 tablet (40 mg total) by mouth daily., Disp: 90 tablet, Rfl: 3 .  tadalafil (CIALIS) 20 MG tablet, Take 0.5 tablets (10 mg total) by mouth daily as needed for erectile dysfunction., Disp: 10 tablet, Rfl: 2 .  testosterone cypionate (DEPOTESTOSTERONE CYPIONATE) 200 MG/ML injection, Inject 0.6 mLs (120 mg total) into the muscle once a week. 0.12ml/week, Disp: 10 mL, Rfl:  1  Current Facility-Administered Medications:  .  testosterone cypionate (DEPOTESTOSTERONE CYPIONATE) injection 120 mg, 120 mg, Intramuscular, Q7 days, Gosrani, Nimish C, MD, 120 mg at 02/23/20 [id number]  Past Medical History: Past Medical History:  Diagnosis Date  . Allergy   . CHF (congestive heart failure) ([place])   . Chronic kidney disease   . COPD (chronic obstructive pulmonary disease) ([employer]) Dx 2015  . Depression   . Eczema    since childhood   . Erectile dysfunction 08/18/2019  . GERD (gastroesophageal reflux disease) 01/25/2020  . Hyperlipidemia 01/12/2018  . Hypertension   . Oxygen deficiency   . Sleep apnea    CPAP  . Substance abuse ([place])    MJ, cocaine  . Testicular failure 07/22/2019    Tobacco Use: Social History   Tobacco Use  Smoking Status Former Smoker  . Packs/day: 1.00  . Years: 38.00  . Pack years: 38.00  . Types: Cigarettes  . Quit date: 10/24/2019  . Years since quitting: 0.3  Smokeless Tobacco Never Used    Labs: Recent Review Flowsheet Data    Labs for ITP Cardiac and Pulmonary Rehab Latest Ref Rng & Units 07/27/2016 10/14/2017 09/23/2018 09/24/2018 07/22/2019   Cholestrol <200 mg/dL - 136 - 91 122   LDLCALC mg/dL (calc) - 60 - 49 68   HDL > OR = 40 mg/dL - 60 - 33(L) 40   Trlycerides <150 mg/dL - 82 - 43 67   Hemoglobin A1c 4.8 - 5.6 % - 5.3  5.5 - -   PHART 7.350 - 7.450 7.341(L) - - - -   PCO2ART 32.0 - 48.0 mmHg 84.2(HH) - - - -   HCO3 20.0 - 28.0 mmol/L 44.3(H) - - - -   TCO2 0 - 100 mmol/L - - - - -   O2SAT % 92.0 - - - -      Capillary Blood Glucose: Lab Results  Component Value Date   GLUCAP 84 11/20/2019   GLUCAP 93 05/12/2018   GLUCAP 179 (H) 07/27/2016   GLUCAP 156 (H) 07/27/2016   GLUCAP 140 (H) 09/16/2015     Pulmonary Assessment Scores: Pulmonary Assessment Scores    Row Name 02/14/20 1338         ADL UCSD   ADL Phase  Entry     SOB Score total  57     Rest  0     Walk  7     Stairs  4     Bath  3     Dress  2      Shop  3       CAT Score   CAT Score  17       mMRC Score   mMRC Score  4       UCSD: Self-administered rating of dyspnea associated with activities of daily living (ADLs) 6-point scale (0 = "not at all" to 5 = "maximal or unable to do because of breathlessness")  Scoring Scores range from 0 to 120.  Minimally important difference is 5 units  CAT: CAT can identify the health impairment of COPD patients and is better correlated with disease progression.  CAT has a scoring range of zero to 40. The CAT score is classified into four groups of low (less than 10), medium (10 - 20), high (21-30) and very high (31-40) based on the impact level of disease on health status. A CAT score over 10 suggests significant symptoms.  A worsening CAT score could be explained by an exacerbation, poor medication adherence, poor inhaler technique, or progression of COPD or comorbid conditions.  CAT MCID is 2 points  mMRC: mMRC (Modified Medical Research Council) Dyspnea Scale is used to assess the degree of baseline functional disability in patients of respiratory disease due to dyspnea. No minimal important difference is established. A decrease in score of 1 point or greater is considered a positive change.   Pulmonary Function Assessment:   Exercise Target Goals: Exercise Program Goal: Individual exercise prescription set using results from initial 6 min walk test and THRR while considering  patient's activity barriers and safety.   Exercise Prescription Goal: Initial exercise prescription builds to 30-45 minutes a day of aerobic activity, 2-3 days per week.  Home exercise guidelines will be given to patient during program as part of exercise prescription that the participant will acknowledge.  Activity Barriers & Risk Stratification: Activity Barriers & Cardiac Risk Stratification - 02/14/20 1251      Activity Barriers & Cardiac Risk Stratification   Activity Barriers  Deconditioning;Shortness of  Breath    Cardiac Risk Stratification  Moderate       6 Minute Walk: 6 Minute Walk    Row Name 02/14/20 1247         6 Minute Walk   Phase  Initial     Distance  800 feet     Walk Time  6 minutes     # of Rest Breaks  0     MPH  1.51  METS  2.16     RPE  12     Perceived Dyspnea   14     VO2 Peak  7.83     Symptoms  Yes (comment)     Comments  SOB     Resting HR  83 bpm     Resting BP  130/90     Resting Oxygen Saturation   96 %     Exercise Oxygen Saturation  during 6 min walk  89 %     Max Ex. HR  99 bpm     Max Ex. BP  148/92     2 Minute Post BP  120/88        Oxygen Initial Assessment: Oxygen Initial Assessment - 02/14/20 1336      Home Oxygen   Home Oxygen Device  Home Concentrator    Sleep Oxygen Prescription  CPAP    Liters per minute  3    Home Exercise Oxygen Prescription  Continuous    Liters per minute  2    Home at Rest Exercise Oxygen Prescription  Continuous    Liters per minute  3    Compliance with Home Oxygen Use  Yes      Initial 6 min Walk   Oxygen Used  Continuous    Liters per minute  4      Program Oxygen Prescription   Program Oxygen Prescription  Continuous    Liters per minute  4      Intervention   Short Term Goals  To learn and exhibit compliance with exercise, home and travel O2 prescription;To learn and understand importance of monitoring SPO2 with pulse oximeter and demonstrate accurate use of the pulse oximeter.;To learn and understand importance of maintaining oxygen saturations>88%    Long  Term Goals  Exhibits compliance with exercise, home and travel O2 prescription;Verbalizes importance of monitoring SPO2 with pulse oximeter and return demonstration;Maintenance of O2 saturations>88%;Exhibits proper breathing techniques, such as pursed lip breathing or other method taught during program session       Oxygen Re-Evaluation: Oxygen Re-Evaluation    Row Name 02/24/20 1556             Program Oxygen Prescription    Program Oxygen Prescription  None       Liters per minute  4       FiO2%  93         Home Oxygen   Home Oxygen Device  Home Concentrator       Sleep Oxygen Prescription  CPAP       Liters per minute  3       Home Exercise Oxygen Prescription  Continuous       Liters per minute  3       Home at Rest Exercise Oxygen Prescription  Continuous       Liters per minute  3       Compliance with Home Oxygen Use  Yes         Goals/Expected Outcomes   Short Term Goals  To learn and exhibit compliance with exercise, home and travel O2 prescription;To learn and understand importance of monitoring SPO2 with pulse oximeter and demonstrate accurate use of the pulse oximeter.;To learn and understand importance of maintaining oxygen saturations>88%;To learn and demonstrate proper pursed lip breathing techniques or other breathing techniques.       Long  Term Goals  Exhibits compliance with exercise, home and travel O2 prescription;Verbalizes importance of monitoring SPO2 with  pulse oximeter and return demonstration;Maintenance of O2 saturations>88%;Exhibits proper breathing techniques, such as pursed lip breathing or other method taught during program session;Compliance with respiratory medication       Comments  Patient demonstrates proper pursed lip breathing techniques during exercise and also demonstrates proper usage of pulse oximeter. He is able to verbalize the importance of maintaining his O2 saturations >88%. He also says he is complaint with all his medication and his home exercise prescription. Will continue to monitor.       Goals/Expected Outcomes  Patient will continue to meet both his short and long term goals.           Oxygen Discharge (Final Oxygen Re-Evaluation): Oxygen Re-Evaluation - 02/24/20 1556      Program Oxygen Prescription   Program Oxygen Prescription  None    Liters per minute  4    FiO2%  93      Home Oxygen   Home Oxygen Device  Home Concentrator    Sleep Oxygen  Prescription  CPAP    Liters per minute  3    Home Exercise Oxygen Prescription  Continuous    Liters per minute  3    Home at Rest Exercise Oxygen Prescription  Continuous    Liters per minute  3    Compliance with Home Oxygen Use  Yes      Goals/Expected Outcomes   Short Term Goals  To learn and exhibit compliance with exercise, home and travel O2 prescription;To learn and understand importance of monitoring SPO2 with pulse oximeter and demonstrate accurate use of the pulse oximeter.;To learn and understand importance of maintaining oxygen saturations>88%;To learn and demonstrate proper pursed lip breathing techniques or other breathing techniques.    Long  Term Goals  Exhibits compliance with exercise, home and travel O2 prescription;Verbalizes importance of monitoring SPO2 with pulse oximeter and return demonstration;Maintenance of O2 saturations>88%;Exhibits proper breathing techniques, such as pursed lip breathing or other method taught during program session;Compliance with respiratory medication    Comments  Patient demonstrates proper pursed lip breathing techniques during exercise and also demonstrates proper usage of pulse oximeter. He is able to verbalize the importance of maintaining his O2 saturations >88%. He also says he is complaint with all his medication and his home exercise prescription. Will continue to monitor.    Goals/Expected Outcomes  Patient will continue to meet both his short and long term goals.        Initial Exercise Prescription: Initial Exercise Prescription - 02/14/20 1200      Date of Initial Exercise RX and Referring Provider   Date  02/14/20    Referring Provider  Dr. [name]    Expected Discharge Date  05/15/20      Oxygen   Oxygen  Continuous    Liters  3      Treadmill   MPH  1.2    Grade  0    Minutes  17    METs  1.91      Recumbant Elliptical   Level  1    RPM  33    Watts  30    Minutes  22    METs  1.6      Prescription  Details   Frequency (times per week)  2    Duration  Progress to 30 minutes of continuous aerobic without signs/symptoms of physical distress      Intensity   THRR 40-80% of Max Heartrate  [phone]    Ratings of Perceived  Exertion  11-13    Perceived Dyspnea  0-4      Progression   Progression  Continue to progress workloads to maintain intensity without signs/symptoms of physical distress.      Resistance Training   Training Prescription  Yes    Weight  1    Reps  10-15       Perform Capillary Blood Glucose checks as needed.  Exercise Prescription Changes:   Exercise Comments:  Exercise Comments    Row Name 02/14/20 1306 02/24/20 1421         Exercise Comments  This is the patients assessment/orientation visit. The patient has been in the program before and did well. He is eagar to get started  This is patients 2nd visit. He is doing well so far. We will continue to progress as tolerated.         Exercise Goals and Review:  Exercise Goals    Row Name 02/14/20 1302             Exercise Goals   Increase Physical Activity  Yes       Intervention  Provide advice, education, support and counseling about physical activity/exercise needs.;Develop an individualized exercise prescription for aerobic and resistive training based on initial evaluation findings, risk stratification, comorbidities and participant's personal goals.       Expected Outcomes  Short Term: Attend rehab on a regular basis to increase amount of physical activity.;Long Term: Add in home exercise to make exercise part of routine and to increase amount of physical activity.       Increase Strength and Stamina  Yes       Intervention  Provide advice, education, support and counseling about physical activity/exercise needs.;Develop an individualized exercise prescription for aerobic and resistive training based on initial evaluation findings, risk stratification, comorbidities and participant's personal  goals.       Expected Outcomes  Short Term: Perform resistance training exercises routinely during rehab and add in resistance training at home;Long Term: Improve cardiorespiratory fitness, muscular endurance and strength as measured by increased METs and functional capacity (6MWT)       Able to understand and use rate of perceived exertion (RPE) scale  Yes       Intervention  Provide education and explanation on how to use RPE scale       Expected Outcomes  Short Term: Able to use RPE daily in rehab to express subjective intensity level;Long Term:  Able to use RPE to guide intensity level when exercising independently       Able to understand and use Dyspnea scale  Yes       Intervention  Provide education and explanation on how to use Dyspnea scale       Expected Outcomes  Short Term: Able to use Dyspnea scale daily in rehab to express subjective sense of shortness of breath during exertion;Long Term: Able to use Dyspnea scale to guide intensity level when exercising independently       Knowledge and understanding of Target Heart Rate Range (THRR)  Yes       Intervention  Provide education and explanation of THRR including how the numbers were predicted and where they are located for reference       Expected Outcomes  Short Term: Able to use daily as guideline for intensity in rehab;Long Term: Able to use THRR to govern intensity when exercising independently       Able to check pulse independently  Yes  Intervention  Provide education and demonstration on how to check pulse in carotid and radial arteries.;Review the importance of being able to check your own pulse for safety during independent exercise       Expected Outcomes  Short Term: Able to explain why pulse checking is important during independent exercise;Long Term: Able to check pulse independently and accurately       Understanding of Exercise Prescription  Yes       Intervention  Provide education, explanation, and written materials  on patient's individual exercise prescription       Expected Outcomes  Short Term: Able to explain program exercise prescription;Long Term: Able to explain home exercise prescription to exercise independently          Exercise Goals Re-Evaluation :   Discharge Exercise Prescription (Final Exercise Prescription Changes):   Nutrition:  Target Goals: Understanding of nutrition guidelines, daily intake of sodium 1500mg , cholesterol 200mg , calories 30% from fat and 7% or less from saturated fats, daily to have 5 or more servings of fruits and vegetables.  Biometrics: Pre Biometrics - 02/14/20 1310      Pre Biometrics   Height  5\' 7"  (1.702 m)    Weight  113.2 kg    Waist Circumference  45 inches    Hip Circumference  41.5 inches    Waist to Hip Ratio  1.08 %    BMI (Calculated)  39.08    Triceps Skinfold  6 mm    % Body Fat  30.3 %    Grip Strength  50.9 kg    Flexibility  0 in    Single Leg Stand  4.73 seconds        Nutrition Therapy Plan and Nutrition Goals: Nutrition Therapy & Goals - 02/24/20 1609      Personal Nutrition Goals   Comments  We continue to work with our RD to schedule classess for our new patients. He scored a 42 on his medficts diet assessment. Will continue to montior for progress.      Intervention Plan   Intervention  Nutrition handout(s) given to patient.       Nutrition Assessments:   Nutrition Goals Re-Evaluation:   Nutrition Goals Discharge (Final Nutrition Goals Re-Evaluation):   Psychosocial: Target Goals: Acknowledge presence or absence of significant depression and/or stress, maximize coping skills, provide positive support system. Participant is able to verbalize types and ability to use techniques and skills needed for reducing stress and depression.  Initial Review & Psychosocial Screening:   Quality of Life Scores: Quality of Life - 02/14/20 1312      Quality of Life   Select  Quality of Life      Quality of Life Scores    Health/Function Pre  20.81 %    Socioeconomic Pre  20.5 %    Psych/Spiritual Pre  21 %    Family Pre  20.4 %    GLOBAL Pre  20.72 %      Scores of 19 and below usually indicate a poorer quality of life in these areas.  A difference of  2-3 points is a clinically meaningful difference.  A difference of 2-3 points in the total score of the Quality of Life Index has been associated with significant improvement in overall quality of life, self-image, physical symptoms, and general health in studies assessing change in quality of life.   PHQ-9: Recent Review Flowsheet Data    Depression screen [facility] 2/9 02/14/2020 01/12/2020 05/26/2018 04/29/2018 02/05/2018   Decreased  Interest 0 0 0 0 0   Down, Depressed, Hopeless 1 0 0 0 0   PHQ - 2 Score 1 0 0 0 0   Altered sleeping 1 - - 0 -   Tired, decreased energy 1 - - - -   Change in appetite 0 - - 0 -   Feeling bad or failure about yourself  1 - - 1 -   Trouble concentrating 1 - - 0 -   Moving slowly or fidgety/restless 0 - - 0 -   Suicidal thoughts 0 - - 0 -   PHQ-9 Score 5 - - 1 -   Difficult doing work/chores Somewhat difficult - - - -     Interpretation of Total Score  Total Score Depression Severity:  1-4 = Minimal depression, 5-9 = Mild depression, 10-14 = Moderate depression, 15-19 = Moderately severe depression, 20-27 = Severe depression   Psychosocial Evaluation and Intervention:   Psychosocial Re-Evaluation: Psychosocial Re-Evaluation    Row Name 02/24/20 1611             Psychosocial Re-Evaluation   Current issues with  None Identified       Comments  Patient's initial QOL score is 20.72 and his PHQ-9 score is 5 with no psychosocial issues identified. Will continue to monitor.       Expected Outcomes  Patient will have no psychosocial issues identified at discharge.       Interventions  Stress management education;Relaxation education;Encouraged to attend Pulmonary Rehabilitation for the exercise       Continue Psychosocial  Services   No Follow up required          Psychosocial Discharge (Final Psychosocial Re-Evaluation): Psychosocial Re-Evaluation - 02/24/20 1611      Psychosocial Re-Evaluation   Current issues with  None Identified    Comments  Patient's initial QOL score is 20.72 and his PHQ-9 score is 5 with no psychosocial issues identified. Will continue to monitor.    Expected Outcomes  Patient will have no psychosocial issues identified at discharge.    Interventions  Stress management education;Relaxation education;Encouraged to attend Pulmonary Rehabilitation for the exercise    Continue Psychosocial Services   No Follow up required        Education: Education Goals: Education classes will be provided on a weekly basis, covering required topics. Participant will state understanding/return demonstration of topics presented.  Learning Barriers/Preferences:   Education Topics: How Lungs Work and Diseases: - Discuss the anatomy of the lungs and diseases that can affect the lungs, such as COPD.   PULMONARY REHAB OTHER RESPIRATORY from 02/24/2020 in [place]  Date  03/12/18  Educator  D. Coad  Instruction Review Code  2- Demonstrated Understanding      Exercise: -Discuss the importance of exercise, FITT principles of exercise, normal and abnormal responses to exercise, and how to exercise safely.   PULMONARY REHAB OTHER RESPIRATORY from 02/24/2020 in [place]  Date  03/05/18  Educator  [place]  Instruction Review Code  2- Demonstrated Understanding      Environmental Irritants: -Discuss types of environmental irritants and how to limit exposure to environmental irritants.   Meds/Inhalers and oxygen: - Discuss respiratory medications, definition of an inhaler and oxygen, and the proper way to use an inhaler and oxygen.   PULMONARY REHAB OTHER RESPIRATORY from 02/24/2020 in [place]  Date  03/26/18  Educator  DC       Energy Saving Techniques: -  Discuss methods to conserve energy and decrease shortness of breath when performing activities of daily living.    PULMONARY REHAB OTHER RESPIRATORY from 02/24/2020 in [place]  Date  04/02/18  Educator  [place]  Instruction Review Code  2- Demonstrated Understanding      Bronchial Hygiene / Breathing Techniques: - Discuss breathing mechanics, pursed-lip breathing technique,  proper posture, effective ways to clear airways, and other functional breathing techniques   PULMONARY REHAB OTHER RESPIRATORY from 02/24/2020 in [place]  Date  04/09/18  Educator  DC  Instruction Review Code  2- Demonstrated Understanding      Cleaning Equipment: - Provides group verbal and written instruction about the health risks of elevated stress, cause of high stress, and healthy ways to reduce stress.   PULMONARY REHAB OTHER RESPIRATORY from 02/24/2020 in [place]  Date  01/15/18  Educator  [place]  Instruction Review Code  2- Demonstrated Understanding      Nutrition I: Fats: - Discuss the types of cholesterol, what cholesterol does to the body, and how cholesterol levels can be controlled.   PULMONARY REHAB OTHER RESPIRATORY from 02/24/2020 in [place]  Date  01/22/18  Educator  [place]  Instruction Review Code  2- Demonstrated Understanding      Nutrition II: Labels: -Discuss the different components of food labels and how to read food labels.   PULMONARY REHAB OTHER RESPIRATORY from 02/24/2020 in [place]  Date  01/29/18  Educator  DJ  Instruction Review Code  2- Demonstrated Understanding      Respiratory Infections: - Discuss the signs and symptoms of respiratory infections, ways to prevent respiratory infections, and the importance of seeking medical treatment when having a respiratory infection.   PULMONARY REHAB OTHER RESPIRATORY from 02/24/2020  in [place]  Date  02/05/18  Educator  [place]  Instruction Review Code  2- Demonstrated Understanding      Stress I: Signs and Symptoms: - Discuss the causes of stress, how stress may lead to anxiety and depression, and ways to limit stress.   Stress II: Relaxation: -Discuss relaxation techniques to limit stress.   PULMONARY REHAB OTHER RESPIRATORY from 02/24/2020 in [place]  Date  02/19/18  Educator  [place]  Instruction Review Code  2- Demonstrated Understanding      Oxygen for Home/Travel: - Discuss how to prepare for travel when on oxygen and proper ways to transport and store oxygen to ensure safety.   Knowledge Questionnaire Score:   Core Components/Risk Factors/Patient Goals at Admission:   Core Components/Risk Factors/Patient Goals Review:  Goals and Risk Factor Review    Row Name 02/24/20 1610             Core Components/Risk Factors/Patient Goals Review   Personal Goals Review  Weight Management/Obesity;Improve shortness of breath with ADL's Breathe better; do more activities; go on trips w/o O2.       Review  Patient has completed 3 sessions maintaining his weight since he started the program. He is doing well so far and hopes to meet his goals as he continues. Will continue to monitor for progress.       Expected Outcomes  Patient will continue to attend sessions and complete the program meeting both his personal and program goals.          Core Components/Risk Factors/Patient Goals at Discharge (Final Review):  Goals and Risk Factor Review - 02/24/20 1610  Core Components/Risk Factors/Patient Goals Review   Personal Goals Review  Weight Management/Obesity;Improve shortness of breath with ADL's   Breathe better; do more activities; go on trips w/o O2.   Review  Patient has completed 3 sessions maintaining his weight since he started the program. He is doing well so far and hopes to meet his goals as he  continues. Will continue to monitor for progress.    Expected Outcomes  Patient will continue to attend sessions and complete the program meeting both his personal and program goals.       ITP Comments:   Comments: ITP REVIEW Pt is making expected progress toward pulmonary rehab goals after completing 3 sessions. Recommend continued exercise, life style modification, education, and utilization of breathing techniques to increase stamina and strength and decrease shortness of breath with exertion.

## 2020-02-28 ENCOUNTER — Other Ambulatory Visit: Payer: Self-pay

## 2020-02-28 ENCOUNTER — Ambulatory Visit (INDEPENDENT_AMBULATORY_CARE_PROVIDER_SITE_OTHER): Payer: Medicare Other | Admitting: Orthopedic Surgery

## 2020-02-28 ENCOUNTER — Ambulatory Visit: Payer: Medicare Other

## 2020-02-28 VITALS — BP 134/86 | HR 83 | Ht 67.0 in | Wt 248.0 lb

## 2020-02-28 DIAGNOSIS — M1711 Unilateral primary osteoarthritis, right knee: Secondary | ICD-10-CM | POA: Diagnosis not present

## 2020-02-28 DIAGNOSIS — M25561 Pain in right knee: Secondary | ICD-10-CM

## 2020-02-28 DIAGNOSIS — G8929 Other chronic pain: Secondary | ICD-10-CM | POA: Diagnosis not present

## 2020-02-28 MED ORDER — MELOXICAM 7.5 MG PO TABS: 7.5000 mg | ORAL_TABLET | Freq: Every day | ORAL | 5 refills | Status: DC

## 2020-02-28 NOTE — Progress Notes (Signed)
Chief Complaint  Patient presents with  . Knee Problem    right/ has not had any pain, feels okay     Encounter Diagnoses  Name Primary?  . Chronic pain of right knee Yes  . Primary osteoarthritis of right knee     59 year old male with chronic pain and arthritis right knee presents for follow-up 1 year after presenting with mild pain and mild arthritis in the medial compartment  He says he has 8 out of 10 pain at times and at times feels fine  Review of systems no mechanical symptoms such as catching locking or giving way stiffness after sitting is noted  Past Medical History:  Diagnosis Date  . Allergy   . CHF (congestive heart failure) ([place])   . Chronic kidney disease   . COPD (chronic obstructive pulmonary disease) ([place]) Dx 2015  . Depression   . Eczema    since childhood   . Erectile dysfunction 08/18/2019  . GERD (gastroesophageal reflux disease) 01/25/2020  . Hyperlipidemia 01/12/2018  . Hypertension   . Oxygen deficiency   . Sleep apnea    CPAP  . Substance abuse ([place])    MJ, cocaine  . Testicular failure 07/22/2019      BP 134/86   Pulse 83   Ht 5\' 7"  (1.702 m)   Wt 248 lb (112.5 kg)   BMI 38.84 kg/m   Physical Exam Vitals and nursing note reviewed.  Constitutional:      Appearance: Normal appearance.  Neurological:     Mental Status: He is alert and oriented to person, place, and time.  Psychiatric:        Mood and Affect: Mood normal.    Right knee no effusion still moves very well with extension and flexion knee feels stable is no meniscal signs he does have tenderness over his medial joint line  His x-ray today shows a ossicle in the front of the joint some lateral tracking of his patella mild arthritis in the tibiofemoral articulation  Recommend meloxicam start 1 tablet daily for pain right knee  Follow-up x-ray 1 year  Chronic pain, prescription management

## 2020-02-28 NOTE — Patient Instructions (Signed)
Start meloxicam for pain

## 2020-02-29 ENCOUNTER — Other Ambulatory Visit: Payer: Self-pay

## 2020-02-29 ENCOUNTER — Encounter (INDEPENDENT_AMBULATORY_CARE_PROVIDER_SITE_OTHER): Payer: Self-pay

## 2020-02-29 ENCOUNTER — Encounter (HOSPITAL_COMMUNITY)
Admission: RE | Admit: 2020-02-29 | Discharge: 2020-02-29 | Disposition: A | Discharge status: Home / Self Care | Payer: Medicare Other | Source: Ambulatory Visit | Attending: Cardiovascular Disease | Admitting: Cardiovascular Disease

## 2020-02-29 DIAGNOSIS — I5032 Chronic diastolic (congestive) heart failure: Secondary | ICD-10-CM | POA: Diagnosis not present

## 2020-03-01 ENCOUNTER — Encounter (INDEPENDENT_AMBULATORY_CARE_PROVIDER_SITE_OTHER): Payer: Self-pay

## 2020-03-01 ENCOUNTER — Ambulatory Visit (INDEPENDENT_AMBULATORY_CARE_PROVIDER_SITE_OTHER): Payer: Medicare Other

## 2020-03-01 VITALS — HR 88 | Temp 98.7°F | Resp 18 | Ht 67.0 in | Wt 247.0 lb

## 2020-03-01 DIAGNOSIS — E291 Testicular hypofunction: Secondary | ICD-10-CM | POA: Diagnosis not present

## 2020-03-01 DIAGNOSIS — N5201 Erectile dysfunction due to arterial insufficiency: Secondary | ICD-10-CM

## 2020-03-01 NOTE — Progress Notes (Signed)
Pt given testosterone 0.3mL to the rt thigh. Pt tolerated well no complaints.

## 2020-03-02 ENCOUNTER — Other Ambulatory Visit: Payer: Self-pay

## 2020-03-02 ENCOUNTER — Encounter (HOSPITAL_COMMUNITY)
Admission: RE | Admit: 2020-03-02 | Discharge: 2020-03-02 | Disposition: A | Discharge status: Home / Self Care | Payer: Medicare Other | Source: Ambulatory Visit | Attending: Cardiovascular Disease | Admitting: Cardiovascular Disease

## 2020-03-02 DIAGNOSIS — I5032 Chronic diastolic (congestive) heart failure: Secondary | ICD-10-CM

## 2020-03-02 NOTE — Progress Notes (Signed)
Daily Session Note  Patient Details  Name: Ricardo Burch MRN: [id number] Date of Birth: August 13, 1961 Referring Provider:     PULMONARY REHAB OTHER RESP ORIENTATION from 02/14/2020 in [place]  Referring Provider  Dr. [name]      Encounter Date: 03/02/2020  Check In: Session Check In - 03/02/20 1445      Check-In   Supervising physician immediately available to respond to emergencies  See telemetry face sheet for immediately available MD    Location  [facility]    Staff Present  Russella Dar, MS, EP, [facility], Exercise Physiologist;Krew Hortman Wynetta Emery, RN, BSN    Virtual Visit  No    Medication changes reported      No    Fall or balance concerns reported     No    Tobacco Cessation  No Change    Warm-up and Cool-down  Performed as group-led instruction    Resistance Training Performed  Yes    VAD Patient?  No    PAD/SET Patient?  No      Pain Assessment   Currently in Pain?  No/denies    Pain Score  0-No pain    Multiple Pain Sites  No       Capillary Blood Glucose: No results found for this or any previous visit (from the past 24 hour(s)).    Social History   Tobacco Use  Smoking Status Former Smoker  . Packs/day: 1.00  . Years: 38.00  . Pack years: 38.00  . Types: Cigarettes  . Quit date: 02/21/2020  . Years since quitting: 0.0  Smokeless Tobacco Never Used    Goals Met:  Proper associated with RPD/PD & O2 Sat Independence with exercise equipment Improved SOB with ADL's Using PLB without cueing & demonstrates good technique Exercise tolerated well No report of cardiac concerns or symptoms Strength training completed today  Goals Unmet:  Not Applicable  Comments: Check out 1545   Dr. Kate Sable is Medical Director for [facility].

## 2020-03-06 ENCOUNTER — Other Ambulatory Visit: Payer: Self-pay | Admitting: Pulmonary Disease

## 2020-03-06 DIAGNOSIS — J438 Other emphysema: Secondary | ICD-10-CM

## 2020-03-07 ENCOUNTER — Telehealth: Payer: Self-pay | Admitting: Pulmonary Disease

## 2020-03-07 ENCOUNTER — Other Ambulatory Visit (INDEPENDENT_AMBULATORY_CARE_PROVIDER_SITE_OTHER): Payer: Self-pay | Admitting: Internal Medicine

## 2020-03-07 ENCOUNTER — Encounter (HOSPITAL_COMMUNITY)
Admission: RE | Admit: 2020-03-07 | Discharge: 2020-03-07 | Disposition: A | Discharge status: Home / Self Care | Payer: Medicare Other | Source: Ambulatory Visit | Attending: Cardiovascular Disease | Admitting: Cardiovascular Disease

## 2020-03-07 DIAGNOSIS — I5032 Chronic diastolic (congestive) heart failure: Secondary | ICD-10-CM

## 2020-03-07 DIAGNOSIS — J449 Chronic obstructive pulmonary disease, unspecified: Secondary | ICD-10-CM

## 2020-03-07 DIAGNOSIS — J438 Other emphysema: Secondary | ICD-10-CM

## 2020-03-07 MED ORDER — TESTOSTERONE CYPIONATE 200 MG/ML IM SOLN: 120.0000 mg | INTRAMUSCULAR | 1 refills | Status: DC

## 2020-03-07 MED ORDER — DALIRESP 500 MCG PO TABS: 500.0000 ug | ORAL_TABLET | Freq: Every day | ORAL | 5 refills | Status: DC

## 2020-03-07 NOTE — Telephone Encounter (Signed)
Pt requesting refill of Daliresp.  This has been sent to preferred pharmacy.  Pt aware.  Nothing further needed at this time- will close encounter.

## 2020-03-07 NOTE — Progress Notes (Signed)
Daily Session Note  Patient Details  Name: Ricardo Burch MRN: [id number] Date of Birth: 05/04/1961 Referring Provider:     PULMONARY REHAB OTHER RESP ORIENTATION from 02/14/2020 in [place] [facility]  Referring Provider  Dr. Koneswaran      Encounter Date: 03/07/2020  Check In: Session Check In - 03/07/20 1430      Check-In   Supervising physician immediately available to respond to emergencies  See telemetry face sheet for immediately available MD    Location  [facility]    Staff Present  Diane Coad, MS, EP, [facility], Exercise Physiologist;Debra Johnson, RN, BSN;Other    Virtual Visit  No    Medication changes reported      No    Fall or balance concerns reported     No    Tobacco Cessation  No Change    Warm-up and Cool-down  Performed as group-led instruction    Resistance Training Performed  Yes    VAD Patient?  No    PAD/SET Patient?  No      Pain Assessment   Currently in Pain?  No/denies    Pain Score  0-No pain    Multiple Pain Sites  No       Capillary Blood Glucose: No results found for this or any previous visit (from the past 24 hour(s)).    Social History   Tobacco Use  Smoking Status Former Smoker  . Packs/day: 1.00  . Years: 38.00  . Pack years: 38.00  . Types: Cigarettes  . Quit date: 02/21/2020  . Years since quitting: 0.0  Smokeless Tobacco Never Used    Goals Met:  Proper associated with RPD/PD & O2 Sat Independence with exercise equipment Using PLB without cueing & demonstrates good technique Exercise tolerated well No report of cardiac concerns or symptoms Strength training completed today  Goals Unmet:  Not Applicable  Comments: Check out 1530.   Dr. Suresh Koneswaran is Medical Director for [facility]. 

## 2020-03-08 ENCOUNTER — Other Ambulatory Visit: Payer: Self-pay

## 2020-03-08 ENCOUNTER — Ambulatory Visit (INDEPENDENT_AMBULATORY_CARE_PROVIDER_SITE_OTHER): Payer: Medicare Other

## 2020-03-08 DIAGNOSIS — E291 Testicular hypofunction: Secondary | ICD-10-CM

## 2020-03-09 ENCOUNTER — Encounter (HOSPITAL_COMMUNITY)
Admission: RE | Admit: 2020-03-09 | Discharge: 2020-03-09 | Disposition: A | Discharge status: Home / Self Care | Payer: Medicare Other | Source: Ambulatory Visit | Attending: Cardiovascular Disease | Admitting: Cardiovascular Disease

## 2020-03-09 DIAGNOSIS — I5032 Chronic diastolic (congestive) heart failure: Secondary | ICD-10-CM

## 2020-03-09 DIAGNOSIS — J449 Chronic obstructive pulmonary disease, unspecified: Secondary | ICD-10-CM

## 2020-03-09 NOTE — Progress Notes (Signed)
Daily Session Note  Patient Details  Name: Ricardo Burch MRN: [id number] Date of Birth: 1960-12-11 Referring Provider:     PULMONARY REHAB OTHER RESP ORIENTATION from 02/14/2020 in [place]  Referring Provider  Dr. [name]      Encounter Date: 03/09/2020  Check In: Session Check In - 03/09/20 1430      Check-In   Supervising physician immediately available to respond to emergencies  See telemetry face sheet for immediately available MD    Location  [facility]    Staff Present  Russella Dar, MS, EP, [facility], Exercise Physiologist;Other    Virtual Visit  No    Medication changes reported      No    Fall or balance concerns reported     No    Tobacco Cessation  No Change    Warm-up and Cool-down  Performed as group-led instruction    Resistance Training Performed  Yes    VAD Patient?  No    PAD/SET Patient?  No      Pain Assessment   Currently in Pain?  No/denies    Pain Score  0-No pain    Multiple Pain Sites  No       Capillary Blood Glucose: No results found for this or any previous visit (from the past 24 hour(s)).    Social History   Tobacco Use  Smoking Status Former Smoker  . Packs/day: 1.00  . Years: 38.00  . Pack years: 38.00  . Types: Cigarettes  . Quit date: 02/21/2020  . Years since quitting: 0.0  Smokeless Tobacco Never Used    Goals Met:  Proper associated with RPD/PD & O2 Sat Independence with exercise equipment Improved SOB with ADL's Using PLB without cueing & demonstrates good technique Exercise tolerated well Personal goals reviewed No report of cardiac concerns or symptoms Strength training completed today  Goals Unmet:  Not Applicable  Comments: Check out: 1530   Dr. Kate Sable is Medical Director for [facility] and [facility] Re[facility].

## 2020-03-14 ENCOUNTER — Encounter (HOSPITAL_COMMUNITY): Payer: Medicare Other

## 2020-03-15 ENCOUNTER — Encounter (INDEPENDENT_AMBULATORY_CARE_PROVIDER_SITE_OTHER): Payer: Self-pay | Admitting: Internal Medicine

## 2020-03-15 ENCOUNTER — Other Ambulatory Visit: Payer: Self-pay

## 2020-03-15 ENCOUNTER — Ambulatory Visit (INDEPENDENT_AMBULATORY_CARE_PROVIDER_SITE_OTHER): Payer: Medicare Other | Admitting: Internal Medicine

## 2020-03-15 ENCOUNTER — Ambulatory Visit (INDEPENDENT_AMBULATORY_CARE_PROVIDER_SITE_OTHER): Payer: Medicare Other

## 2020-03-15 VITALS — BP 135/85 | HR 91 | Temp 99.4°F | Ht 67.0 in | Wt 249.0 lb

## 2020-03-15 DIAGNOSIS — R1031 Right lower quadrant pain: Secondary | ICD-10-CM

## 2020-03-15 DIAGNOSIS — N5201 Erectile dysfunction due to arterial insufficiency: Secondary | ICD-10-CM

## 2020-03-15 DIAGNOSIS — M545 Low back pain, unspecified: Secondary | ICD-10-CM

## 2020-03-15 DIAGNOSIS — E291 Testicular hypofunction: Secondary | ICD-10-CM

## 2020-03-15 MED ORDER — TESTOSTERONE CYPIONATE 200 MG/ML IM SOLN
200.0000 mg | INTRAMUSCULAR | Status: DC
  Administered 2020-03-15 – 2020-03-29 (×3): 200 mg via INTRAMUSCULAR
  Administered 2020-04-05: 120 mg via INTRAMUSCULAR
  Administered 2020-04-05: 200 mg via INTRAMUSCULAR
  Administered 2020-04-12: 120 mg via INTRAMUSCULAR
  Administered 2020-04-19 – 2020-05-10 (×3): 200 mg via INTRAMUSCULAR

## 2020-03-15 NOTE — Progress Notes (Signed)
Metrics: Intervention Frequency ACO  Documented Smoking Status Yearly  Screened one or more times in 24 months  Cessation Counseling or  Active cessation medication Past 24 months  Past 24 months   Guideline developer: UpToDate (See UpToDate for funding source) Date Released: 2014       Wellness Office Visit  Subjective:  Patient ID: Ricardo Burch, male    DOB: 17-May-1961  Age: 59 y.o. MRN: [id number]  CC: This man came in for his weekly testosterone injection but he also had right loin pain and groin pain and therefore I saw him today as an acute visit. HPI  The above symptoms of been present intermittently for several months and he has been to the emergency room and he has had lumbar spine x-rays which have shown osteoarthritis.  He denies any hematuria.  The pain is apparently excruciating and starts in the right groin area and seems to radiate into the right groin. Past Medical History:  Diagnosis Date  . Allergy   . CHF (congestive heart failure) ([place])   . Chronic kidney disease   . COPD (chronic obstructive pulmonary disease) ([place]) Dx 2015  . Depression   . Eczema    since childhood   . Erectile dysfunction 08/18/2019  . GERD (gastroesophageal reflux disease) 01/25/2020  . Hyperlipidemia 01/12/2018  . Hypertension   . Oxygen deficiency   . Sleep apnea    CPAP  . Substance abuse ([place])    MJ, cocaine  . Testicular failure 07/22/2019      Family History  Problem Relation Age of Onset  . Arthritis Mother   . Hypertension Mother   . Stroke Mother        age 26  . Cerebral aneurysm Father   . Alcohol abuse Father   . Cancer Brother   . Diabetes Neg Hx   . Heart disease Neg Hx     Social History   Social History Narrative    Lives alone.    5 daughters 48, 60 yo twins, eldest 2 are married live in [place]. Retired.Girlfriend works in Armed forces logistics/support/administrative officer.   Social History   Tobacco Use  . Smoking status: Former Smoker    Packs/day: 1.00    Years: 38.00    Pack  years: 38.00    Types: Cigarettes    Quit date: 02/21/2020    Years since quitting: 0.0  . Smokeless tobacco: Never Used  Substance Use Topics  . Alcohol use: Not Currently    Alcohol/week: 1.0 standard drinks    Types: 1 Cans of beer per week    Comment: Socially x 1/month currently (as of 01/25/20); previously: occassionally: weekends, 6-pack or less on a day; or half pint of liquior    Current Meds  Medication Sig  . albuterol (VENTOLIN HFA) 108 (90 Base) MCG/ACT inhaler INHALE 1 OR 2 PUFFS BY MOUTH EVERY 6 HOURS AS NEEDED FOR WHEEZING OR SHORTNESS OF BREATH  . amLODipine (NORVASC) 5 MG tablet TAKE 1 TABLET BY MOUTH EVERY DAY  . Budeson-Glycopyrrol-Formoterol (BREZTRI AEROSPHERE) 160-9-4.8 MCG/ACT AERO Inhale 2 puffs into the lungs 2 (two) times daily.  . Cholecalciferol (VITAMIN D-3) 125 MCG (5000 UT) TABS Take 2 tablets by mouth daily.  . fluticasone (FLONASE) 50 MCG/ACT nasal spray INSTILL 2 SPRAYS IN EACH NOSTRIL TWICE DAILY  . ipratropium-albuterol (DUONEB) 0.5-2.5 (3) MG/3ML SOLN Take 3 mLs by nebulization every 4 (four) hours as needed.  . meloxicam (MOBIC) 7.5 MG tablet Take 1 tablet (7.5 mg total) by  mouth daily.  . OXYGEN Inhale 3 L into the lungs daily. continuous  . pantoprazole (PROTONIX) 40 MG tablet Take 1 tablet (40 mg total) by mouth daily.  . roflumilast (DALIRESP) 500 MCG TABS tablet Take 1 tablet (500 mcg total) by mouth daily.  . tadalafil (CIALIS) 20 MG tablet Take 0.5 tablets (10 mg total) by mouth daily as needed for erectile dysfunction.  [name] testosterone cypionate (DEPOTESTOSTERONE CYPIONATE) 200 MG/ML injection Inject 0.6 mLs (120 mg total) into the muscle once a week. 0.42ml/week   Current Facility-Administered Medications for the 03/15/20 encounter (Office Visit) with Doree Albee, MD  Medication  . testosterone cypionate (DEPOTESTOSTERONE CYPIONATE) injection 200 mg       Objective:   Today's Vitals: BP 135/85 (BP Location: Right Arm, Patient  Position: Sitting, Cuff Size: Normal)   Pulse 91   Temp 99.4 F (37.4 C) (Temporal)   Ht 5\' 7"  (1.702 m)   Wt 249 lb (112.9 kg)   SpO2 96%   BMI 39.00 kg/m  Vitals with BMI 03/15/2020 03/01/2020 02/28/2020  Height 5\' 7"  5\' 7"  5\' 7"   Weight 249 lbs 247 lbs 248 lbs  BMI 38.99 0000000 [id number]  Systolic [id number] - [id number]  Diastolic 85 - 86  Pulse 91 88 83     Physical Exam  He does not appear to be in significant pain at the present time.  Examination of his lower back does not show any abnormalities such as tenderness.     Assessment   1. Right loin pain   2. Right lower quadrant abdominal pain       Tests ordered Orders Placed This Encounter  Procedures  . CT RENAL STONE STUDY     Plan: 1. I wonder if this pain represents nephrolithiasis.  I will organize a CT renal stone study and see what this shows.   No orders of the defined types were placed in this encounter.   Doree Albee, MD

## 2020-03-16 ENCOUNTER — Encounter (HOSPITAL_COMMUNITY): Payer: Medicare Other

## 2020-03-17 NOTE — Progress Notes (Signed)
Pulmonary Individual Treatment Plan  Patient Details  Name: Ricardo Burch MRN: [id number] Date of Birth: [birth year] Referring Provider:     PULMONARY REHAB OTHER RESP ORIENTATION from 02/14/2020 in [place]  Referring Provider  Dr. [name]      Initial Encounter Date:    [place] from 02/14/2020 in [place]  Date  02/14/20      Visit Diagnosis: Chronic diastolic CHF (congestive heart failure) (HCC)  Chronic obstructive pulmonary disease, unspecified COPD type ([place])  Patient's Home Medications on Admission:   Current Outpatient Medications:  .  albuterol (VENTOLIN HFA) 108 (90 Base) MCG/ACT inhaler, INHALE 1 OR 2 PUFFS BY MOUTH EVERY 6 HOURS AS NEEDED FOR WHEEZING OR SHORTNESS OF BREATH, Disp: 18 g, Rfl: 4 .  amLODipine (NORVASC) 5 MG tablet, TAKE 1 TABLET BY MOUTH EVERY DAY, Disp: 90 tablet, Rfl: 0 .  Budeson-Glycopyrrol-Formoterol (BREZTRI AEROSPHERE) 160-9-4.8 MCG/ACT AERO, Inhale 2 puffs into the lungs 2 (two) times daily., Disp: 10.7 g, Rfl: 3 .  Cholecalciferol (VITAMIN D-3) 125 MCG (5000 UT) TABS, Take 2 tablets by mouth daily., Disp: , Rfl:  .  fluticasone (FLONASE) 50 MCG/ACT nasal spray, INSTILL 2 SPRAYS IN EACH NOSTRIL TWICE DAILY, Disp: 16 mL, Rfl: 5 .  ipratropium-albuterol (DUONEB) 0.5-2.5 (3) MG/3ML SOLN, Take 3 mLs by nebulization every 4 (four) hours as needed., Disp: 360 mL, Rfl: 11 .  meloxicam (MOBIC) 7.5 MG tablet, Take 1 tablet (7.5 mg total) by mouth daily., Disp: 30 tablet, Rfl: 5 .  OXYGEN, Inhale 3 L into the lungs daily. continuous, Disp: , Rfl:  .  pantoprazole (PROTONIX) 40 MG tablet, Take 1 tablet (40 mg total) by mouth daily., Disp: 90 tablet, Rfl: 3 .  roflumilast (DALIRESP) 500 MCG TABS tablet, Take 1 tablet (500 mcg total) by mouth daily., Disp: 30 tablet, Rfl: 5 .  tadalafil (CIALIS) 20 MG tablet, Take 0.5 tablets (10 mg total) by mouth daily as needed for erectile  dysfunction., Disp: 10 tablet, Rfl: 2 .  testosterone cypionate (DEPOTESTOSTERONE CYPIONATE) 200 MG/ML injection, Inject 0.6 mLs (120 mg total) into the muscle once a week. 0.93ml/week, Disp: 10 mL, Rfl: 1  Current Facility-Administered Medications:  .  testosterone cypionate (DEPOTESTOSTERONE CYPIONATE) injection 200 mg, 200 mg, Intramuscular, Q14 Days, Gosrani, Nimish C, MD, 200 mg at 03/15/20 0915  Past Medical History: Past Medical History:  Diagnosis Date  . Allergy   . CHF (congestive heart failure) ([place])   . Chronic kidney disease   . COPD (chronic obstructive pulmonary disease) ([place]) Dx 2015  . Depression   . Eczema    since childhood   . Erectile dysfunction 08/18/2019  . GERD (gastroesophageal reflux disease) 01/25/2020  . Hyperlipidemia 01/12/2018  . Hypertension   . Oxygen deficiency   . Sleep apnea    CPAP  . Substance abuse ([place])    MJ, cocaine  . Testicular failure 07/22/2019    Tobacco Use: Social History   Tobacco Use  Smoking Status Former Smoker  . Packs/day: 1.00  . Years: 38.00  . Pack years: 38.00  . Types: Cigarettes  . Quit date: 02/21/2020  . Years since quitting: 0.0  Smokeless Tobacco Never Used    Labs: Recent Review Flowsheet Data    Labs for ITP Cardiac and Pulmonary Rehab Latest Ref Rng & Units 07/27/2016 10/14/2017 09/23/2018 09/24/2018 07/22/2019   Cholestrol <200 mg/dL - 136 - 91 122   LDLCALC mg/dL (calc) - 60 - 49 68  HDL > OR = 40 mg/dL - 60 - 33(L) 40   Trlycerides <150 mg/dL - 82 - 43 67   Hemoglobin A1c 4.8 - 5.6 % - 5.3 5.5 - -   PHART 7.350 - 7.450 7.341(L) - - - -   PCO2ART 32.0 - 48.0 mmHg 84.2(HH) - - - -   HCO3 20.0 - 28.0 mmol/L 44.3(H) - - - -   TCO2 0 - 100 mmol/L - - - - -   O2SAT % 92.0 - - - -      Capillary Blood Glucose: Lab Results  Component Value Date   GLUCAP 84 11/20/2019   GLUCAP 93 05/12/2018   GLUCAP 179 (H) 07/27/2016   GLUCAP 156 (H) 07/27/2016   GLUCAP 140 (H) 09/16/2015     Pulmonary  Assessment Scores: Pulmonary Assessment Scores    Row Name 02/14/20 1338         ADL UCSD   ADL Phase  Entry     SOB Score total  57     Rest  0     Walk  7     Stairs  4     Bath  3     Dress  2     Shop  3       CAT Score   CAT Score  17       mMRC Score   mMRC Score  4       UCSD: Self-administered rating of dyspnea associated with activities of daily living (ADLs) 6-point scale (0 = "not at all" to 5 = "maximal or unable to do because of breathlessness")  Scoring Scores range from 0 to 120.  Minimally important difference is 5 units  CAT: CAT can identify the health impairment of COPD patients and is better correlated with disease progression.  CAT has a scoring range of zero to 40. The CAT score is classified into four groups of low (less than 10), medium (10 - 20), high (21-30) and very high (31-40) based on the impact level of disease on health status. A CAT score over 10 suggests significant symptoms.  A worsening CAT score could be explained by an exacerbation, poor medication adherence, poor inhaler technique, or progression of COPD or comorbid conditions.  CAT MCID is 2 points  mMRC: mMRC (Modified Medical Research Council) Dyspnea Scale is used to assess the degree of baseline functional disability in patients of respiratory disease due to dyspnea. No minimal important difference is established. A decrease in score of 1 point or greater is considered a positive change.   Pulmonary Function Assessment:   Exercise Target Goals: Exercise Program Goal: Individual exercise prescription set using results from initial 6 min walk test and THRR while considering  patient's activity barriers and safety.   Exercise Prescription Goal: Initial exercise prescription builds to 30-45 minutes a day of aerobic activity, 2-3 days per week.  Home exercise guidelines will be given to patient during program as part of exercise prescription that the participant will  acknowledge.  Activity Barriers & Risk Stratification: Activity Barriers & Cardiac Risk Stratification - 02/14/20 1251      Activity Barriers & Cardiac Risk Stratification   Activity Barriers  Deconditioning;Shortness of Breath    Cardiac Risk Stratification  Moderate       6 Minute Walk: 6 Minute Walk    Row Name 02/14/20 1247         6 Minute Walk   Phase  Initial  Distance  800 feet     Walk Time  6 minutes     # of Rest Breaks  0     MPH  1.51     METS  2.16     RPE  12     Perceived Dyspnea   14     VO2 Peak  7.83     Symptoms  Yes (comment)     Comments  SOB     Resting HR  83 bpm     Resting BP  130/90     Resting Oxygen Saturation   96 %     Exercise Oxygen Saturation  during 6 min walk  89 %     Max Ex. HR  99 bpm     Max Ex. BP  148/92     2 Minute Post BP  120/88        Oxygen Initial Assessment: Oxygen Initial Assessment - 02/14/20 1336      Home Oxygen   Home Oxygen Device  Home Concentrator    Sleep Oxygen Prescription  CPAP    Liters per minute  3    Home Exercise Oxygen Prescription  Continuous    Liters per minute  2    Home at Rest Exercise Oxygen Prescription  Continuous    Liters per minute  3    Compliance with Home Oxygen Use  Yes      Initial 6 min Walk   Oxygen Used  Continuous    Liters per minute  4      Program Oxygen Prescription   Program Oxygen Prescription  Continuous    Liters per minute  4      Intervention   Short Term Goals  To learn and exhibit compliance with exercise, home and travel O2 prescription;To learn and understand importance of monitoring SPO2 with pulse oximeter and demonstrate accurate use of the pulse oximeter.;To learn and understand importance of maintaining oxygen saturations>88%    Long  Term Goals  Exhibits compliance with exercise, home and travel O2 prescription;Verbalizes importance of monitoring SPO2 with pulse oximeter and return demonstration;Maintenance of O2 saturations>88%;Exhibits proper  breathing techniques, such as pursed lip breathing or other method taught during program session       Oxygen Re-Evaluation: Oxygen Re-Evaluation    Row Name 02/24/20 1556 03/17/20 1458           Program Oxygen Prescription   Program Oxygen Prescription  None  Continuous      Liters per minute  4  4      FiO2%  93  93        Home Oxygen   Home Oxygen Device  Home Concentrator  Home Concentrator      Sleep Oxygen Prescription  CPAP  CPAP      Liters per minute  3  3      Home Exercise Oxygen Prescription  Continuous  Continuous      Liters per minute  3  3      Home at Rest Exercise Oxygen Prescription  Continuous  None      Liters per minute  3  3      Compliance with Home Oxygen Use  Yes  Yes        Goals/Expected Outcomes   Short Term Goals  To learn and exhibit compliance with exercise, home and travel O2 prescription;To learn and understand importance of monitoring SPO2 with pulse oximeter and demonstrate accurate use of the pulse  oximeter.;To learn and understand importance of maintaining oxygen saturations>88%;To learn and demonstrate proper pursed lip breathing techniques or other breathing techniques.  To learn and exhibit compliance with exercise, home and travel O2 prescription;To learn and understand importance of monitoring SPO2 with pulse oximeter and demonstrate accurate use of the pulse oximeter.;To learn and understand importance of maintaining oxygen saturations>88%;To learn and demonstrate proper pursed lip breathing techniques or other breathing techniques.      Long  Term Goals  Exhibits compliance with exercise, home and travel O2 prescription;Verbalizes importance of monitoring SPO2 with pulse oximeter and return demonstration;Maintenance of O2 saturations>88%;Exhibits proper breathing techniques, such as pursed lip breathing or other method taught during program session;Compliance with respiratory medication  Exhibits compliance with exercise, home and travel O2  prescription;Verbalizes importance of monitoring SPO2 with pulse oximeter and return demonstration;Maintenance of O2 saturations>88%;Exhibits proper breathing techniques, such as pursed lip breathing or other method taught during program session;Compliance with respiratory medication      Comments  Patient demonstrates proper pursed lip breathing techniques during exercise and also demonstrates proper usage of pulse oximeter. He is able to verbalize the importance of maintaining his O2 saturations >88%. He also says he is complaint with all his medication and his home exercise prescription. Will continue to monitor.  Patient demonstrates proper pursed lip breathing techniques during exercise and also demonstrates proper usage of pulse oximeter. He is able to verbalize the importance of maintaining his O2 saturations >88%. He also says he is complaint with all his medication and his home exercise prescription. Will continue to monitor.      Goals/Expected Outcomes  Patient will continue to meet both his short and long term goals.   Patient will continue to meet both his short and long term goals.          Oxygen Discharge (Final Oxygen Re-Evaluation): Oxygen Re-Evaluation - 03/17/20 1458      Program Oxygen Prescription   Program Oxygen Prescription  Continuous    Liters per minute  4    FiO2%  93      Home Oxygen   Home Oxygen Device  Home Concentrator    Sleep Oxygen Prescription  CPAP    Liters per minute  3    Home Exercise Oxygen Prescription  Continuous    Liters per minute  3    Home at Rest Exercise Oxygen Prescription  None    Liters per minute  3    Compliance with Home Oxygen Use  Yes      Goals/Expected Outcomes   Short Term Goals  To learn and exhibit compliance with exercise, home and travel O2 prescription;To learn and understand importance of monitoring SPO2 with pulse oximeter and demonstrate accurate use of the pulse oximeter.;To learn and understand importance of  maintaining oxygen saturations>88%;To learn and demonstrate proper pursed lip breathing techniques or other breathing techniques.    Long  Term Goals  Exhibits compliance with exercise, home and travel O2 prescription;Verbalizes importance of monitoring SPO2 with pulse oximeter and return demonstration;Maintenance of O2 saturations>88%;Exhibits proper breathing techniques, such as pursed lip breathing or other method taught during program session;Compliance with respiratory medication    Comments  Patient demonstrates proper pursed lip breathing techniques during exercise and also demonstrates proper usage of pulse oximeter. He is able to verbalize the importance of maintaining his O2 saturations >88%. He also says he is complaint with all his medication and his home exercise prescription. Will continue to monitor.    Goals/Expected Outcomes  Patient will continue to meet both his short and long term goals.        Initial Exercise Prescription: Initial Exercise Prescription - 02/14/20 1200      Date of Initial Exercise RX and Referring Provider   Date  02/14/20    Referring Provider  Dr. [name]    Expected Discharge Date  05/15/20      Oxygen   Oxygen  Continuous    Liters  3      Treadmill   MPH  1.2    Grade  0    Minutes  17    METs  1.91      Recumbant Elliptical   Level  1    RPM  33    Watts  30    Minutes  22    METs  1.6      Prescription Details   Frequency (times per week)  2    Duration  Progress to 30 minutes of continuous aerobic without signs/symptoms of physical distress      Intensity   THRR 40-80% of Max Heartrate  [phone]    Ratings of Perceived Exertion  11-13    Perceived Dyspnea  0-4      Progression   Progression  Continue to progress workloads to maintain intensity without signs/symptoms of physical distress.      Resistance Training   Training Prescription  Yes    Weight  1    Reps  10-15       Perform Capillary Blood Glucose checks  as needed.  Exercise Prescription Changes:  Exercise Prescription Changes    Row Name 03/20/20 1500             Response to Exercise   Blood Pressure (Admit)  140/84       Blood Pressure (Exercise)  142/106       Blood Pressure (Exit)  122/84       Heart Rate (Admit)  91 bpm       Heart Rate (Exercise)  115 bpm       Heart Rate (Exit)  97 bpm       Oxygen Saturation (Admit)  95 %       Oxygen Saturation (Exercise)  91 %       Oxygen Saturation (Exit)  94 %       Rating of Perceived Exertion (Exercise)  11       Perceived Dyspnea (Exercise)  11       Duration  Continue with 30 min of aerobic exercise without signs/symptoms of physical distress.         Progression   Progression  Continue to progress workloads to maintain intensity without signs/symptoms of physical distress.       Average METs  1.9         Resistance Training   Training Prescription  Yes       Weight  2       Reps  10-15         Oxygen   Oxygen  Continuous       Liters  3         Treadmill   MPH  1.5       Grade  0       Minutes  17       METs  2.14         Recumbant Elliptical   Level  1       RPM  41  Watts  43       Minutes  22       METs  2.4         Home Exercise Plan   Plans to continue exercise at  Home (comment)       Frequency  Add 3 additional days to program exercise sessions.       Initial Home Exercises Provided  02/14/20          Exercise Comments:  Exercise Comments    Row Name 02/14/20 1306 02/24/20 1421 03/20/20 1546       Exercise Comments  This is the patients assessment/orientation visit. The patient has been in the program before and did well. He is eagar to get started  This is patients 2nd visit. He is doing well so far. We will continue to progress as tolerated.  Patient is attending regularly. Patient stated that he is using less oxygen when he travels around town. He is doing well in the program and we will continue to progress as tolerated.         Exercise Goals and Review:  Exercise Goals    Row Name 02/14/20 1302             Exercise Goals   Increase Physical Activity  Yes       Intervention  Provide advice, education, support and counseling about physical activity/exercise needs.;Develop an individualized exercise prescription for aerobic and resistive training based on initial evaluation findings, risk stratification, comorbidities and participant's personal goals.       Expected Outcomes  Short Term: Attend rehab on a regular basis to increase amount of physical activity.;Long Term: Add in home exercise to make exercise part of routine and to increase amount of physical activity.       Increase Strength and Stamina  Yes       Intervention  Provide advice, education, support and counseling about physical activity/exercise needs.;Develop an individualized exercise prescription for aerobic and resistive training based on initial evaluation findings, risk stratification, comorbidities and participant's personal goals.       Expected Outcomes  Short Term: Perform resistance training exercises routinely during rehab and add in resistance training at home;Long Term: Improve cardiorespiratory fitness, muscular endurance and strength as measured by increased METs and functional capacity (6MWT)       Able to understand and use rate of perceived exertion (RPE) scale  Yes       Intervention  Provide education and explanation on how to use RPE scale       Expected Outcomes  Short Term: Able to use RPE daily in rehab to express subjective intensity level;Long Term:  Able to use RPE to guide intensity level when exercising independently       Able to understand and use Dyspnea scale  Yes       Intervention  Provide education and explanation on how to use Dyspnea scale       Expected Outcomes  Short Term: Able to use Dyspnea scale daily in rehab to express subjective sense of shortness of breath during exertion;Long Term: Able to use Dyspnea  scale to guide intensity level when exercising independently       Knowledge and understanding of Target Heart Rate Range (THRR)  Yes       Intervention  Provide education and explanation of THRR including how the numbers were predicted and where they are located for reference       Expected Outcomes  Short Term: Able  to use daily as guideline for intensity in rehab;Long Term: Able to use THRR to govern intensity when exercising independently       Able to check pulse independently  Yes       Intervention  Provide education and demonstration on how to check pulse in carotid and radial arteries.;Review the importance of being able to check your own pulse for safety during independent exercise       Expected Outcomes  Short Term: Able to explain why pulse checking is important during independent exercise;Long Term: Able to check pulse independently and accurately       Understanding of Exercise Prescription  Yes       Intervention  Provide education, explanation, and written materials on patient's individual exercise prescription       Expected Outcomes  Short Term: Able to explain program exercise prescription;Long Term: Able to explain home exercise prescription to exercise independently          Exercise Goals Re-Evaluation : Exercise Goals Re-Evaluation    [place] Name 03/20/20 1544             Exercise Goal Re-Evaluation   Exercise Goals Review  Increase Physical Activity;Increase Strength and Stamina       Comments  Patient wants to breathe better and to be able to go on trips without oxygen.       Expected Outcomes  To reach expected goals          Discharge Exercise Prescription (Final Exercise Prescription Changes): Exercise Prescription Changes - 03/20/20 1500      Response to Exercise   Blood Pressure (Admit)  140/84    Blood Pressure (Exercise)  142/106    Blood Pressure (Exit)  122/84    Heart Rate (Admit)  91 bpm    Heart Rate (Exercise)  115 bpm    Heart Rate (Exit)  97  bpm    Oxygen Saturation (Admit)  95 %    Oxygen Saturation (Exercise)  91 %    Oxygen Saturation (Exit)  94 %    Rating of Perceived Exertion (Exercise)  11    Perceived Dyspnea (Exercise)  11    Duration  Continue with 30 min of aerobic exercise without signs/symptoms of physical distress.      Progression   Progression  Continue to progress workloads to maintain intensity without signs/symptoms of physical distress.    Average METs  1.9      Resistance Training   Training Prescription  Yes    Weight  2    Reps  10-15      Oxygen   Oxygen  Continuous    Liters  3      Treadmill   MPH  1.5    Grade  0    Minutes  17    METs  2.14      Recumbant Elliptical   Level  1    RPM  41    Watts  43    Minutes  22    METs  2.4      Home Exercise Plan   Plans to continue exercise at  Home (comment)    Frequency  Add 3 additional days to program exercise sessions.    Initial Home Exercises Provided  02/14/20       Nutrition:  Target Goals: Understanding of nutrition guidelines, daily intake of sodium 1500mg , cholesterol 200mg , calories 30% from fat and 7% or less from saturated fats, daily to have 5 or  more servings of fruits and vegetables.  Biometrics: Pre Biometrics - 02/14/20 1310      Pre Biometrics   Height  5\' 7"  (1.702 m)    Weight  113.2 kg    Waist Circumference  45 inches    Hip Circumference  41.5 inches    Waist to Hip Ratio  1.08 %    BMI (Calculated)  39.08    Triceps Skinfold  6 mm    % Body Fat  30.3 %    Grip Strength  50.9 kg    Flexibility  0 in    Single Leg Stand  4.73 seconds        Nutrition Therapy Plan and Nutrition Goals: Nutrition Therapy & Goals - 03/17/20 1459      Personal Nutrition Goals   Comments  We continue to work with our RD to schedule classess for our new patients. He scored a 42 on his medficts diet assessment. Will continue to montior for progress.      Intervention Plan   Intervention  Nutrition handout(s) given  to patient.       Nutrition Assessments:   Nutrition Goals Re-Evaluation:   Nutrition Goals Discharge (Final Nutrition Goals Re-Evaluation):   Psychosocial: Target Goals: Acknowledge presence or absence of significant depression and/or stress, maximize coping skills, provide positive support system. Participant is able to verbalize types and ability to use techniques and skills needed for reducing stress and depression.  Initial Review & Psychosocial Screening:   Quality of Life Scores: Quality of Life - 02/14/20 1312      Quality of Life   Select  Quality of Life      Quality of Life Scores   Health/Function Pre  20.81 %    Socioeconomic Pre  20.5 %    Psych/Spiritual Pre  21 %    Family Pre  20.4 %    GLOBAL Pre  20.72 %      Scores of 19 and below usually indicate a poorer quality of life in these areas.  A difference of  2-3 points is a clinically meaningful difference.  A difference of 2-3 points in the total score of the Quality of Life Index has been associated with significant improvement in overall quality of life, self-image, physical symptoms, and general health in studies assessing change in quality of life.   PHQ-9: Recent Review Flowsheet Data    Depression screen [facility] 2/9 02/14/2020 01/12/2020 05/26/2018 04/29/2018 02/05/2018   Decreased Interest 0 0 0 0 0   Down, Depressed, Hopeless 1 0 0 0 0   PHQ - 2 Score 1 0 0 0 0   Altered sleeping 1 - - 0 -   Tired, decreased energy 1 - - - -   Change in appetite 0 - - 0 -   Feeling bad or failure about yourself  1 - - 1 -   Trouble concentrating 1 - - 0 -   Moving slowly or fidgety/restless 0 - - 0 -   Suicidal thoughts 0 - - 0 -   PHQ-9 Score 5 - - 1 -   Difficult doing work/chores Somewhat difficult - - - -     Interpretation of Total Score  Total Score Depression Severity:  1-4 = Minimal depression, 5-9 = Mild depression, 10-14 = Moderate depression, 15-19 = Moderately severe depression, 20-27 = Severe  depression   Psychosocial Evaluation and Intervention:   Psychosocial Re-Evaluation: Psychosocial Re-Evaluation    Row Name 02/24/20 1611 03/17/20  1459           Psychosocial Re-Evaluation   Current issues with  None Identified  None Identified      Comments  Patient's initial QOL score is 20.72 and his PHQ-9 score is 5 with no psychosocial issues identified. Will continue to monitor.  Patient's initial QOL score is 20.72 and his PHQ-9 score is 5 with no psychosocial issues identified. Will continue to monitor.      Expected Outcomes  Patient will have no psychosocial issues identified at discharge.  Patient will have no psychosocial issues identified at discharge.      Interventions  Stress management education;Relaxation education;Encouraged to attend Pulmonary Rehabilitation for the exercise  Stress management education;Relaxation education;Encouraged to attend Pulmonary Rehabilitation for the exercise      Continue Psychosocial Services   No Follow up required  No Follow up required         Psychosocial Discharge (Final Psychosocial Re-Evaluation): Psychosocial Re-Evaluation - 03/17/20 1459      Psychosocial Re-Evaluation   Current issues with  None Identified    Comments  Patient's initial QOL score is 20.72 and his PHQ-9 score is 5 with no psychosocial issues identified. Will continue to monitor.    Expected Outcomes  Patient will have no psychosocial issues identified at discharge.    Interventions  Stress management education;Relaxation education;Encouraged to attend Pulmonary Rehabilitation for the exercise    Continue Psychosocial Services   No Follow up required        Education: Education Goals: Education classes will be provided on a weekly basis, covering required topics. Participant will state understanding/return demonstration of topics presented.  Learning Barriers/Preferences:   Education Topics: How Lungs Work and Diseases: - Discuss the anatomy of the  lungs and diseases that can affect the lungs, such as COPD.   PULMONARY REHAB OTHER RESPIRATORY from 03/09/2020 in [place]  Date  03/12/18  Educator  D. Coad  Instruction Review Code  2- Demonstrated Understanding      Exercise: -Discuss the importance of exercise, FITT principles of exercise, normal and abnormal responses to exercise, and how to exercise safely.   PULMONARY REHAB OTHER RESPIRATORY from 03/09/2020 in [place]  Date  03/05/18  Educator  [place]  Instruction Review Code  2- Demonstrated Understanding      Environmental Irritants: -Discuss types of environmental irritants and how to limit exposure to environmental irritants.   Meds/Inhalers and oxygen: - Discuss respiratory medications, definition of an inhaler and oxygen, and the proper way to use an inhaler and oxygen.   PULMONARY REHAB OTHER RESPIRATORY from 03/09/2020 in [place]  Date  03/26/18  Educator  DC      Energy Saving Techniques: - Discuss methods to conserve energy and decrease shortness of breath when performing activities of daily living.    PULMONARY REHAB OTHER RESPIRATORY from 03/09/2020 in [place]  Date  04/02/18  Educator  [place]  Instruction Review Code  2- Demonstrated Understanding      Bronchial Hygiene / Breathing Techniques: - Discuss breathing mechanics, pursed-lip breathing technique,  proper posture, effective ways to clear airways, and other functional breathing techniques   PULMONARY REHAB OTHER RESPIRATORY from 03/09/2020 in [place]  Date  04/09/18  Educator  DC  Instruction Review Code  2- Demonstrated Understanding      Cleaning Equipment: - Provides group verbal and written instruction about the health risks of elevated stress, cause of  high stress, and healthy ways to reduce stress.   PULMONARY REHAB OTHER RESPIRATORY from 03/09/2020 in [place]  Date  01/15/18  Educator  [place]  Instruction Review Code  2- Demonstrated Understanding      Nutrition I: Fats: - Discuss the types of cholesterol, what cholesterol does to the body, and how cholesterol levels can be controlled.   PULMONARY REHAB OTHER RESPIRATORY from 03/09/2020 in [place]  Date  01/22/18  Educator  [place]  Instruction Review Code  2- Demonstrated Understanding      Nutrition II: Labels: -Discuss the different components of food labels and how to read food labels.   PULMONARY REHAB OTHER RESPIRATORY from 03/09/2020 in [place]  Date  01/29/18  Educator  DJ  Instruction Review Code  2- Demonstrated Understanding      Respiratory Infections: - Discuss the signs and symptoms of respiratory infections, ways to prevent respiratory infections, and the importance of seeking medical treatment when having a respiratory infection.   PULMONARY REHAB OTHER RESPIRATORY from 03/09/2020 in [place]  Date  02/05/18  Educator  [place]  Instruction Review Code  2- Demonstrated Understanding      Stress I: Signs and Symptoms: - Discuss the causes of stress, how stress may lead to anxiety and depression, and ways to limit stress.   Stress II: Relaxation: -Discuss relaxation techniques to limit stress.   PULMONARY REHAB OTHER RESPIRATORY from 03/09/2020 in [place]  Date  02/19/18  Educator  [place]  Instruction Review Code  2- Demonstrated Understanding      Oxygen for Home/Travel: - Discuss how to prepare for travel when on oxygen and proper ways to transport and store oxygen to ensure safety.   Knowledge Questionnaire Score:   Core Components/Risk Factors/Patient Goals at Admission:   Core Components/Risk Factors/Patient Goals Review:  Goals and Risk Factor Review    Row Name 02/24/20 1610 03/17/20 1459           Core Components/Risk Factors/Patient Goals Review    Personal Goals Review  Weight Management/Obesity;Improve shortness of breath with ADL's Breathe better; do more activities; go on trips w/o O2.  Weight Management/Obesity;Improve shortness of breath with ADL's Breathe better; do more activities; go on trips without using oxygen.      Review  Patient has completed 3 sessions maintaining his weight since he started the program. He is doing well so far and hopes to meet his goals as he continues. Will continue to monitor for progress.  Patient has completed 7 sessions losing 2 lbs since last 30 day review. He continues to do well in the program with progression. He has missed some sessions recently due to low back pain per patient. He is being followed by his pcp who reports right lower quadrand abdominal pain and right loin pain. He is ordering a Renal CT for next week. Hopefully, he will be able to return to the program. Will continue to monitor for progress.      Expected Outcomes  Patient will continue to attend sessions and complete the program meeting both his personal and program goals.  Patient will continue to attend sessions and complete the program meeting both his personal and program goals.         Core Components/Risk Factors/Patient Goals at Discharge (Final Review):  Goals and Risk Factor Review - 03/17/20 1459      Core Components/Risk Factors/Patient Goals Review   Personal Goals  Review  Weight Management/Obesity;Improve shortness of breath with ADL's   Breathe better; do more activities; go on trips without using oxygen.   Review  Patient has completed 7 sessions losing 2 lbs since last 30 day review. He continues to do well in the program with progression. He has missed some sessions recently due to low back pain per patient. He is being followed by his pcp who reports right lower quadrand abdominal pain and right loin pain. He is ordering a Renal CT for next week. Hopefully, he will be able to return to the program. Will continue to  monitor for progress.    Expected Outcomes  Patient will continue to attend sessions and complete the program meeting both his personal and program goals.       ITP Comments:   Comments: ITP REVIEW Pt is making expected progress toward pulmonary rehab goals after completing 7 sessions. Recommend continued exercise, life style modification, education, and utilization of breathing techniques to increase stamina and strength and decrease shortness of breath with exertion.

## 2020-03-20 ENCOUNTER — Ambulatory Visit (HOSPITAL_COMMUNITY)
Admission: RE | Admit: 2020-03-20 | Discharge: 2020-03-20 | Disposition: A | Discharge status: Home / Self Care | Payer: Medicare Other | Source: Ambulatory Visit | Attending: Internal Medicine | Admitting: Internal Medicine

## 2020-03-20 ENCOUNTER — Other Ambulatory Visit: Payer: Self-pay

## 2020-03-20 DIAGNOSIS — R1031 Right lower quadrant pain: Secondary | ICD-10-CM | POA: Diagnosis present

## 2020-03-20 DIAGNOSIS — M545 Low back pain: Secondary | ICD-10-CM | POA: Insufficient documentation

## 2020-03-21 ENCOUNTER — Encounter (HOSPITAL_COMMUNITY)
Admission: RE | Admit: 2020-03-21 | Discharge: 2020-03-21 | Disposition: A | Discharge status: Home / Self Care | Payer: Medicare Other | Source: Ambulatory Visit | Attending: Cardiovascular Disease | Admitting: Cardiovascular Disease

## 2020-03-21 DIAGNOSIS — I5032 Chronic diastolic (congestive) heart failure: Secondary | ICD-10-CM | POA: Diagnosis not present

## 2020-03-21 DIAGNOSIS — J449 Chronic obstructive pulmonary disease, unspecified: Secondary | ICD-10-CM

## 2020-03-21 NOTE — Progress Notes (Signed)
Daily Session Note  Patient Details  Name: Meshulem Onorato MRN: [id number] Date of Birth: 01/29/1961 Referring Provider:     PULMONARY REHAB OTHER RESP ORIENTATION from 02/14/2020 in [place]  Referring Provider  Dr. [name]      Encounter Date: 03/21/2020  Check In: Session Check In - 03/21/20 0245      Check-In   Location  [facility]    Staff Present  Russella Dar, MS, EP, [facility], Exercise Physiologist    Virtual Visit  No    Medication changes reported      No    Fall or balance concerns reported     No    Tobacco Cessation  No Change    Warm-up and Cool-down  Performed as group-led instruction    Resistance Training Performed  Yes    VAD Patient?  No    PAD/SET Patient?  No      Pain Assessment   Currently in Pain?  No/denies    Pain Score  0-No pain    Multiple Pain Sites  No       Capillary Blood Glucose: No results found for this or any previous visit (from the past 24 hour(s)).    Social History   Tobacco Use  Smoking Status Former Smoker  . Packs/day: 1.00  . Years: 38.00  . Pack years: 38.00  . Types: Cigarettes  . Quit date: 02/21/2020  . Years since quitting: 0.0  Smokeless Tobacco Never Used    Goals Met:  Proper associated with RPD/PD & O2 Sat Independence with exercise equipment Improved SOB with ADL's Using PLB without cueing & demonstrates good technique Exercise tolerated well Personal goals reviewed No report of cardiac concerns or symptoms Strength training completed today  Goals Unmet:  Not Applicable  Comments: Check out: 58    Dr. Kate Sable is Medical Director for [place] and Pulmonary Rehab.

## 2020-03-22 ENCOUNTER — Ambulatory Visit (INDEPENDENT_AMBULATORY_CARE_PROVIDER_SITE_OTHER): Payer: Medicare Other

## 2020-03-22 ENCOUNTER — Other Ambulatory Visit: Payer: Self-pay

## 2020-03-22 DIAGNOSIS — N5201 Erectile dysfunction due to arterial insufficiency: Secondary | ICD-10-CM | POA: Diagnosis not present

## 2020-03-22 NOTE — Progress Notes (Signed)
Patient was given 200mg  of Testosterone in the Right Thigh and tolerated it well

## 2020-03-23 ENCOUNTER — Encounter (HOSPITAL_COMMUNITY)
Admission: RE | Admit: 2020-03-23 | Discharge: 2020-03-23 | Disposition: A | Discharge status: Home / Self Care | Payer: Medicare Other | Source: Ambulatory Visit | Attending: Cardiovascular Disease | Admitting: Cardiovascular Disease

## 2020-03-23 DIAGNOSIS — I5032 Chronic diastolic (congestive) heart failure: Secondary | ICD-10-CM | POA: Diagnosis not present

## 2020-03-23 DIAGNOSIS — J449 Chronic obstructive pulmonary disease, unspecified: Secondary | ICD-10-CM

## 2020-03-23 NOTE — Progress Notes (Signed)
Daily Session Note  Patient Details  Name: Ricardo Burch MRN: [id number] Date of Birth: 11-11-1960 Referring Provider:     PULMONARY REHAB OTHER RESP ORIENTATION from 02/14/2020 in [place]  Referring Provider  Dr. [name]      Encounter Date: 03/23/2020  Check In: Session Check In - 03/23/20 1440      Check-In   Supervising physician immediately available to respond to emergencies  See telemetry face sheet for immediately available MD    Location  [facility]    Staff Present  Russella Dar, MS, EP, [facility], Exercise Physiologist;Aneli Zara Wynetta Emery, RN, BSN;Other    Virtual Visit  No    Medication changes reported      No    Fall or balance concerns reported     No    Tobacco Cessation  No Change    Current number of cigarettes/nicotine per day      0    Warm-up and Cool-down  Performed as group-led instruction    Resistance Training Performed  Yes    VAD Patient?  No    PAD/SET Patient?  No      Pain Assessment   Currently in Pain?  No/denies    Pain Score  0-No pain    Multiple Pain Sites  No       Capillary Blood Glucose: No results found for this or any previous visit (from the past 24 hour(s)).    Social History   Tobacco Use  Smoking Status Former Smoker  . Packs/day: 1.00  . Years: 38.00  . Pack years: 38.00  . Types: Cigarettes  . Quit date: 02/21/2020  . Years since quitting: 0.0  Smokeless Tobacco Never Used    Goals Met:  Proper associated with RPD/PD & O2 Sat Independence with exercise equipment Improved SOB with ADL's Using PLB without cueing & demonstrates good technique Exercise tolerated well No report of cardiac concerns or symptoms Strength training completed today  Goals Unmet:  Not Applicable  Comments: Check out 1545.   Dr. Kate Sable is Medical Director for [facility] Cardiac and Pulmonary Rehab.

## 2020-03-28 ENCOUNTER — Other Ambulatory Visit: Payer: Self-pay

## 2020-03-28 ENCOUNTER — Encounter (HOSPITAL_COMMUNITY)
Admission: RE | Admit: 2020-03-28 | Discharge: 2020-03-28 | Disposition: A | Discharge status: Home / Self Care | Payer: Medicare Other | Source: Ambulatory Visit | Attending: Cardiovascular Disease | Admitting: Cardiovascular Disease

## 2020-03-28 DIAGNOSIS — I5032 Chronic diastolic (congestive) heart failure: Secondary | ICD-10-CM | POA: Diagnosis not present

## 2020-03-28 DIAGNOSIS — J449 Chronic obstructive pulmonary disease, unspecified: Secondary | ICD-10-CM

## 2020-03-28 NOTE — Progress Notes (Signed)
Daily Session Note  Patient Details  Name: Ricardo Burch MRN: [id number] Date of Birth: 06-07-1961 Referring Provider:     PULMONARY REHAB OTHER RESP ORIENTATION from 02/14/2020 in [place]  Referring Provider  Dr. [name]      Encounter Date: 03/28/2020  Check In: Session Check In - 03/28/20 0245      Check-In   Supervising physician immediately available to respond to emergencies  See telemetry face sheet for immediately available MD    Location  [facility]    Staff Present  Russella Dar, MS, EP, [facility], Exercise Physiologist;Nisha Dhami, Exercise Physiologist;Other    Virtual Visit  No    Medication changes reported      No    Fall or balance concerns reported     No    Tobacco Cessation  No Change    Warm-up and Cool-down  Performed as group-led instruction    Resistance Training Performed  Yes    VAD Patient?  No    PAD/SET Patient?  No      Pain Assessment   Currently in Pain?  No/denies    Pain Score  0-No pain    Multiple Pain Sites  No       Capillary Blood Glucose: No results found for this or any previous visit (from the past 24 hour(s)).    Social History   Tobacco Use  Smoking Status Former Smoker  . Packs/day: 1.00  . Years: 38.00  . Pack years: 38.00  . Types: Cigarettes  . Quit date: 02/21/2020  . Years since quitting: 0.0  Smokeless Tobacco Never Used    Goals Met:  Proper associated with RPD/PD & O2 Sat Independence with exercise equipment Improved SOB with ADL's Using PLB without cueing & demonstrates good technique Exercise tolerated well No report of cardiac concerns or symptoms Strength training completed today  Goals Unmet:  Not Applicable  Comments: Check out: 63   Dr. Kathie Dike is Medical Director for [facility] Pulmonary Rehab.

## 2020-03-29 ENCOUNTER — Other Ambulatory Visit: Payer: Self-pay

## 2020-03-29 ENCOUNTER — Ambulatory Visit (INDEPENDENT_AMBULATORY_CARE_PROVIDER_SITE_OTHER): Payer: Medicare Other

## 2020-03-29 DIAGNOSIS — N5201 Erectile dysfunction due to arterial insufficiency: Secondary | ICD-10-CM

## 2020-03-30 ENCOUNTER — Encounter (HOSPITAL_COMMUNITY)
Admission: RE | Admit: 2020-03-30 | Discharge: 2020-03-30 | Disposition: A | Discharge status: Home / Self Care | Payer: Medicare Other | Source: Ambulatory Visit | Attending: Cardiovascular Disease | Admitting: Cardiovascular Disease

## 2020-03-30 DIAGNOSIS — I5032 Chronic diastolic (congestive) heart failure: Secondary | ICD-10-CM | POA: Diagnosis not present

## 2020-03-30 DIAGNOSIS — J449 Chronic obstructive pulmonary disease, unspecified: Secondary | ICD-10-CM

## 2020-03-30 NOTE — Progress Notes (Signed)
Daily Session Note  Patient Details  Name: Ricardo Burch MRN: [id number] Date of Birth: 1961/01/08 Referring Provider:     PULMONARY REHAB OTHER RESP ORIENTATION from 02/14/2020 in [place]  Referring Provider  Dr. [name]      Encounter Date: 03/30/2020  Check In: Session Check In - 03/30/20 1437      Check-In   Supervising physician immediately available to respond to emergencies  See telemetry face sheet for immediately available MD    Location  [facility]    Staff Present  Russella Dar, MS, EP, [facility], Exercise Physiologist;Vaniece Hatchett, Exercise Physiologist;Other;Keimya Briddell Wynetta Emery, RN, BSN    Virtual Visit  No    Medication changes reported      No    Fall or balance concerns reported     No    Tobacco Cessation  No Change    Current number of cigarettes/nicotine per day      0    Warm-up and Cool-down  Performed as group-led instruction    Resistance Training Performed  Yes    VAD Patient?  No    PAD/SET Patient?  No      Pain Assessment   Currently in Pain?  No/denies    Pain Score  0-No pain    Multiple Pain Sites  No       Capillary Blood Glucose: No results found for this or any previous visit (from the past 24 hour(s)).    Social History   Tobacco Use  Smoking Status Former Smoker  . Packs/day: 1.00  . Years: 38.00  . Pack years: 38.00  . Types: Cigarettes  . Quit date: 02/21/2020  . Years since quitting: 0.1  Smokeless Tobacco Never Used    Goals Met:  Proper associated with RPD/PD & O2 Sat Independence with exercise equipment Improved SOB with ADL's Using PLB without cueing & demonstrates good technique Exercise tolerated well No report of cardiac concerns or symptoms Strength training completed today  Goals Unmet:  Not Applicable  Comments: Check out 1530.   Dr. Kathie Dike is Medical Director for [facility] Pulmonary Rehab.

## 2020-04-04 ENCOUNTER — Encounter (HOSPITAL_COMMUNITY): Payer: Medicare Other

## 2020-04-05 ENCOUNTER — Other Ambulatory Visit: Payer: Self-pay

## 2020-04-05 ENCOUNTER — Ambulatory Visit (INDEPENDENT_AMBULATORY_CARE_PROVIDER_SITE_OTHER): Payer: Medicare Other

## 2020-04-05 DIAGNOSIS — E291 Testicular hypofunction: Secondary | ICD-10-CM

## 2020-04-05 DIAGNOSIS — N5201 Erectile dysfunction due to arterial insufficiency: Secondary | ICD-10-CM | POA: Diagnosis not present

## 2020-04-05 NOTE — Progress Notes (Signed)
Pt was given 120 mg (0.76mL) tolerated well. Ask to bring a new bottle on next visit.

## 2020-04-06 ENCOUNTER — Encounter (HOSPITAL_COMMUNITY)
Admission: RE | Admit: 2020-04-06 | Discharge: 2020-04-06 | Disposition: A | Discharge status: Home / Self Care | Payer: Medicare Other | Source: Ambulatory Visit | Attending: Cardiovascular Disease | Admitting: Cardiovascular Disease

## 2020-04-06 DIAGNOSIS — I5032 Chronic diastolic (congestive) heart failure: Secondary | ICD-10-CM | POA: Diagnosis not present

## 2020-04-06 NOTE — Progress Notes (Signed)
Daily Session Note  Patient Details  Name: Ricardo Burch MRN: [id number] Date of Birth: 03/15/1961 Referring Provider:     PULMONARY REHAB OTHER RESP ORIENTATION from 02/14/2020 in [place]  Referring Provider  Dr. [name]      Encounter Date: 04/06/2020  Check In: Session Check In - 04/06/20 1445      Check-In   Supervising physician immediately available to respond to emergencies  See telemetry face sheet for immediately available MD    Location  [facility]    Staff Present  Russella Dar, MS, EP, [facility], Exercise Physiologist;Other;Vaniece Hatchett, Exercise Physiologist    Virtual Visit  No    Medication changes reported      No    Fall or balance concerns reported     No    Tobacco Cessation  No Change    Warm-up and Cool-down  Performed as group-led instruction    Resistance Training Performed  Yes    VAD Patient?  No    PAD/SET Patient?  No      Pain Assessment   Currently in Pain?  No/denies    Pain Score  0-No pain    Multiple Pain Sites  No       Capillary Blood Glucose: No results found for this or any previous visit (from the past 24 hour(s)).    Social History   Tobacco Use  Smoking Status Former Smoker  . Packs/day: 1.00  . Years: 38.00  . Pack years: 38.00  . Types: Cigarettes  . Quit date: 02/21/2020  . Years since quitting: 0.1  Smokeless Tobacco Never Used    Goals Met:  Proper associated with RPD/PD & O2 Sat Independence with exercise equipment Improved SOB with ADL's Using PLB without cueing & demonstrates good technique Exercise tolerated well Personal goals reviewed No report of cardiac concerns or symptoms Strength training completed today  Goals Unmet:  Not Applicable  Comments: check out 15:45   Dr. Kathie Dike is Medical Director for [facility] Pulmonary Rehab.

## 2020-04-07 ENCOUNTER — Encounter: Payer: Self-pay | Admitting: Gastroenterology

## 2020-04-11 ENCOUNTER — Encounter (HOSPITAL_COMMUNITY)
Admission: RE | Admit: 2020-04-11 | Discharge: 2020-04-11 | Disposition: A | Discharge status: Home / Self Care | Payer: Medicare Other | Source: Ambulatory Visit | Attending: Cardiovascular Disease | Admitting: Cardiovascular Disease

## 2020-04-11 ENCOUNTER — Other Ambulatory Visit: Payer: Self-pay | Admitting: Pulmonary Disease

## 2020-04-11 ENCOUNTER — Other Ambulatory Visit: Payer: Self-pay

## 2020-04-11 ENCOUNTER — Other Ambulatory Visit (INDEPENDENT_AMBULATORY_CARE_PROVIDER_SITE_OTHER): Payer: Self-pay | Admitting: Internal Medicine

## 2020-04-11 DIAGNOSIS — I5032 Chronic diastolic (congestive) heart failure: Secondary | ICD-10-CM

## 2020-04-11 DIAGNOSIS — J438 Other emphysema: Secondary | ICD-10-CM

## 2020-04-11 MED ORDER — TESTOSTERONE CYPIONATE 200 MG/ML IM SOLN: 120.0000 mg | INTRAMUSCULAR | 1 refills | Status: DC

## 2020-04-11 NOTE — Progress Notes (Signed)
Daily Session Note  Patient Details  Name: Ricardo Burch MRN: [id number] Date of Birth: 1961-01-30 Referring Provider:     PULMONARY REHAB OTHER RESP ORIENTATION from 02/14/2020 in [place]  Referring Provider  Dr. [name]      Encounter Date: 04/11/2020  Check In: Session Check In - 04/11/20 1445      Check-In   Supervising physician immediately available to respond to emergencies  See telemetry face sheet for immediately available MD    Location  [facility]    Staff Present  Algis Downs, Exercise Physiologist;Arther Heisler Wynetta Emery, RN, BSN    Virtual Visit  No    Medication changes reported      No    Fall or balance concerns reported     No    Tobacco Cessation  No Change    Current number of cigarettes/nicotine per day      0    Warm-up and Cool-down  Performed as group-led instruction    Resistance Training Performed  Yes    VAD Patient?  No    PAD/SET Patient?  No      Pain Assessment   Currently in Pain?  No/denies    Pain Score  0-No pain    Multiple Pain Sites  No       Capillary Blood Glucose: No results found for this or any previous visit (from the past 24 hour(s)).    Social History   Tobacco Use  Smoking Status Former Smoker   Packs/day: 1.00   Years: 38.00   Pack years: 38.00   Types: Cigarettes   Quit date: 02/21/2020   Years since quitting: 0.1  Smokeless Tobacco Never Used    Goals Met:  Proper associated with RPD/PD & O2 Sat Independence with exercise equipment Improved SOB with ADL's Using PLB without cueing & demonstrates good technique Exercise tolerated well Personal goals reviewed  Goals Unmet:  Not Applicable  Comments: Check out 1545.   Dr. Kathie Dike is Medical Director for [facility] Pulmonary Rehab.

## 2020-04-12 ENCOUNTER — Ambulatory Visit (INDEPENDENT_AMBULATORY_CARE_PROVIDER_SITE_OTHER): Payer: Medicare Other

## 2020-04-12 ENCOUNTER — Other Ambulatory Visit: Payer: Self-pay

## 2020-04-12 DIAGNOSIS — E291 Testicular hypofunction: Secondary | ICD-10-CM

## 2020-04-12 DIAGNOSIS — N5201 Erectile dysfunction due to arterial insufficiency: Secondary | ICD-10-CM

## 2020-04-12 NOTE — Progress Notes (Signed)
Pulmonary Individual Treatment Plan  Patient Details  Name: Ricardo Burch MRN: [id number] Date of Birth: [birth year] Referring Provider:     PULMONARY REHAB OTHER RESP ORIENTATION from 02/14/2020 in [facility]  Referring Provider  Dr. [name]      Initial Encounter Date:    [facility] from 02/14/2020 in [facility]  Date  02/14/20      Visit Diagnosis: Chronic diastolic CHF (congestive heart failure) (HCC)  Patient's Home Medications on Admission:   Current Outpatient Medications:  .  albuterol (VENTOLIN HFA) 108 (90 Base) MCG/ACT inhaler, INHALE 1 OR 2 PUFFS BY MOUTH EVERY 6 HOURS AS NEEDED FOR WHEEZING OR SHORTNESS OF BREATH, Disp: 18 g, Rfl: 4 .  amLODipine (NORVASC) 5 MG tablet, TAKE 1 TABLET BY MOUTH EVERY DAY, Disp: 90 tablet, Rfl: 0 .  BREZTRI AEROSPHERE 160-9-4.8 MCG/ACT AERO, INHALE 2 PUFFS BY MOUTH TWICE DAILY, Disp: 10.7 g, Rfl: 5 .  Cholecalciferol (VITAMIN D-3) 125 MCG (5000 UT) TABS, Take 2 tablets by mouth daily., Disp: , Rfl:  .  fluticasone (FLONASE) 50 MCG/ACT nasal spray, INSTILL 2 SPRAYS IN EACH NOSTRIL TWICE DAILY, Disp: 16 mL, Rfl: 5 .  ipratropium-albuterol (DUONEB) 0.5-2.5 (3) MG/3ML SOLN, Take 3 mLs by nebulization every 4 (four) hours as needed., Disp: 360 mL, Rfl: 11 .  meloxicam (MOBIC) 7.5 MG tablet, Take 1 tablet (7.5 mg total) by mouth daily., Disp: 30 tablet, Rfl: 5 .  OXYGEN, Inhale 3 L into the lungs daily. continuous, Disp: , Rfl:  .  pantoprazole (PROTONIX) 40 MG tablet, Take 1 tablet (40 mg total) by mouth daily., Disp: 90 tablet, Rfl: 3 .  roflumilast (DALIRESP) 500 MCG TABS tablet, Take 1 tablet (500 mcg total) by mouth daily., Disp: 30 tablet, Rfl: 5 .  tadalafil (CIALIS) 20 MG tablet, Take 0.5 tablets (10 mg total) by mouth daily as needed for erectile dysfunction., Disp: 10 tablet, Rfl: 2 .  testosterone cypionate (DEPOTESTOSTERONE CYPIONATE) 200 MG/ML injection, Inject 0.6  mLs (120 mg total) into the muscle once a week. 0.54ml/week, Disp: 10 mL, Rfl: 1  Current Facility-Administered Medications:  .  testosterone cypionate (DEPOTESTOSTERONE CYPIONATE) injection 200 mg, 200 mg, Intramuscular, Q14 Days, Gosrani, Nimish C, MD, 120 mg at 04/12/20 1007  Past Medical History: Past Medical History:  Diagnosis Date  . Allergy   . CHF (congestive heart failure) ([place])   . Chronic kidney disease   . COPD (chronic obstructive pulmonary disease) ([place]) Dx 2015  . Depression   . Eczema    since childhood   . Erectile dysfunction 08/18/2019  . GERD (gastroesophageal reflux disease) 01/25/2020  . Hyperlipidemia 01/12/2018  . Hypertension   . Oxygen deficiency   . Sleep apnea    CPAP  . Substance abuse ([place])    MJ, cocaine  . Testicular failure 07/22/2019    Tobacco Use: Social History   Tobacco Use  Smoking Status Former Smoker  . Packs/day: 1.00  . Years: 38.00  . Pack years: 38.00  . Types: Cigarettes  . Quit date: 02/21/2020  . Years since quitting: 0.1  Smokeless Tobacco Never Used    Labs: Recent Chemical engineer    Labs for ITP Cardiac and Pulmonary Rehab Latest Ref Rng & Units 07/27/2016 10/14/2017 09/23/2018 09/24/2018 07/22/2019   Cholestrol <200 mg/dL - 136 - 91 122   LDLCALC mg/dL (calc) - 60 - 49 68   HDL > OR = 40 mg/dL - 60 - 33(L) 40  Trlycerides <150 mg/dL - 82 - 43 67   Hemoglobin A1c 4.8 - 5.6 % - 5.3 5.5 - -   PHART 7.350 - 7.450 7.341(L) - - - -   PCO2ART 32.0 - 48.0 mmHg 84.2(HH) - - - -   HCO3 20.0 - 28.0 mmol/L 44.3(H) - - - -   TCO2 0 - 100 mmol/L - - - - -   O2SAT % 92.0 - - - -      Capillary Blood Glucose: Lab Results  Component Value Date   GLUCAP 84 11/20/2019   GLUCAP 93 05/12/2018   GLUCAP 179 (H) 07/27/2016   GLUCAP 156 (H) 07/27/2016   GLUCAP 140 (H) 09/16/2015     Pulmonary Assessment Scores: Pulmonary Assessment Scores    Row Name 02/14/20 1338         ADL UCSD   ADL Phase  Entry     SOB Score  total  57     Rest  0     Walk  7     Stairs  4     Bath  3     Dress  2     Shop  3       CAT Score   CAT Score  17       mMRC Score   mMRC Score  4       UCSD: Self-administered rating of dyspnea associated with activities of daily living (ADLs) 6-point scale (0 = "not at all" to 5 = "maximal or unable to do because of breathlessness")  Scoring Scores range from 0 to 120.  Minimally important difference is 5 units  CAT: CAT can identify the health impairment of COPD patients and is better correlated with disease progression.  CAT has a scoring range of zero to 40. The CAT score is classified into four groups of low (less than 10), medium (10 - 20), high (21-30) and very high (31-40) based on the impact level of disease on health status. A CAT score over 10 suggests significant symptoms.  A worsening CAT score could be explained by an exacerbation, poor medication adherence, poor inhaler technique, or progression of COPD or comorbid conditions.  CAT MCID is 2 points  mMRC: mMRC (Modified Medical Research Council) Dyspnea Scale is used to assess the degree of baseline functional disability in patients of respiratory disease due to dyspnea. No minimal important difference is established. A decrease in score of 1 point or greater is considered a positive change.   Pulmonary Function Assessment:   Exercise Target Goals: Exercise Program Goal: Individual exercise prescription set using results from initial 6 min walk test and THRR while considering  patient's activity barriers and safety.   Exercise Prescription Goal: Initial exercise prescription builds to 30-45 minutes a day of aerobic activity, 2-3 days per week.  Home exercise guidelines will be given to patient during program as part of exercise prescription that the participant will acknowledge.  Activity Barriers & Risk Stratification: Activity Barriers & Cardiac Risk Stratification - 02/14/20 1251      Activity Barriers  & Cardiac Risk Stratification   Activity Barriers  Deconditioning;Shortness of Breath    Cardiac Risk Stratification  Moderate       6 Minute Walk: 6 Minute Walk    Row Name 02/14/20 1247         6 Minute Walk   Phase  Initial     Distance  800 feet     Walk Time  6  minutes     # of Rest Breaks  0     MPH  1.51     METS  2.16     RPE  12     Perceived Dyspnea   14     VO2 Peak  7.83     Symptoms  Yes (comment)     Comments  SOB     Resting HR  83 bpm     Resting BP  130/90     Resting Oxygen Saturation   96 %     Exercise Oxygen Saturation  during 6 min walk  89 %     Max Ex. HR  99 bpm     Max Ex. BP  148/92     2 Minute Post BP  120/88        Oxygen Initial Assessment: Oxygen Initial Assessment - 02/14/20 1336      Home Oxygen   Home Oxygen Device  Home Concentrator    Sleep Oxygen Prescription  CPAP    Liters per minute  3    Home Exercise Oxygen Prescription  Continuous    Liters per minute  2    Home at Rest Exercise Oxygen Prescription  Continuous    Liters per minute  3    Compliance with Home Oxygen Use  Yes      Initial 6 min Walk   Oxygen Used  Continuous    Liters per minute  4      Program Oxygen Prescription   Program Oxygen Prescription  Continuous    Liters per minute  4      Intervention   Short Term Goals  To learn and exhibit compliance with exercise, home and travel O2 prescription;To learn and understand importance of monitoring SPO2 with pulse oximeter and demonstrate accurate use of the pulse oximeter.;To learn and understand importance of maintaining oxygen saturations>88%    Long  Term Goals  Exhibits compliance with exercise, home and travel O2 prescription;Verbalizes importance of monitoring SPO2 with pulse oximeter and return demonstration;Maintenance of O2 saturations>88%;Exhibits proper breathing techniques, such as pursed lip breathing or other method taught during program session       Oxygen Re-Evaluation: Oxygen  Re-Evaluation    Row Name 02/24/20 1556 03/17/20 1458 04/12/20 1428         Program Oxygen Prescription   Program Oxygen Prescription  None  Continuous  Continuous     Liters per minute  4  4  4      FiO2%  93  93  94       Home Oxygen   Home Oxygen Device  Home Concentrator  Home Concentrator  Home Concentrator     Sleep Oxygen Prescription  CPAP  CPAP  CPAP     Liters per minute  3  3  2      Home Exercise Oxygen Prescription  Continuous  Continuous  None     Liters per minute  3  3  3      Home at Rest Exercise Oxygen Prescription  Continuous  None  None     Liters per minute  3  3  --     Compliance with Home Oxygen Use  Yes  Yes  Yes       Goals/Expected Outcomes   Short Term Goals  To learn and exhibit compliance with exercise, home and travel O2 prescription;To learn and understand importance of monitoring SPO2 with pulse oximeter and demonstrate accurate use of the pulse  oximeter.;To learn and understand importance of maintaining oxygen saturations>88%;To learn and demonstrate proper pursed lip breathing techniques or other breathing techniques.  To learn and exhibit compliance with exercise, home and travel O2 prescription;To learn and understand importance of monitoring SPO2 with pulse oximeter and demonstrate accurate use of the pulse oximeter.;To learn and understand importance of maintaining oxygen saturations>88%;To learn and demonstrate proper pursed lip breathing techniques or other breathing techniques.  To learn and exhibit compliance with exercise, home and travel O2 prescription;To learn and understand importance of monitoring SPO2 with pulse oximeter and demonstrate accurate use of the pulse oximeter.;To learn and understand importance of maintaining oxygen saturations>88%;To learn and demonstrate proper pursed lip breathing techniques or other breathing techniques.     Long  Term Goals  Exhibits compliance with exercise, home and travel O2 prescription;Verbalizes  importance of monitoring SPO2 with pulse oximeter and return demonstration;Maintenance of O2 saturations>88%;Exhibits proper breathing techniques, such as pursed lip breathing or other method taught during program session;Compliance with respiratory medication  Exhibits compliance with exercise, home and travel O2 prescription;Verbalizes importance of monitoring SPO2 with pulse oximeter and return demonstration;Maintenance of O2 saturations>88%;Exhibits proper breathing techniques, such as pursed lip breathing or other method taught during program session;Compliance with respiratory medication  Exhibits compliance with exercise, home and travel O2 prescription;Verbalizes importance of monitoring SPO2 with pulse oximeter and return demonstration;Maintenance of O2 saturations>88%;Exhibits proper breathing techniques, such as pursed lip breathing or other method taught during program session;Compliance with respiratory medication     Comments  Patient demonstrates proper pursed lip breathing techniques during exercise and also demonstrates proper usage of pulse oximeter. He is able to verbalize the importance of maintaining his O2 saturations >88%. He also says he is complaint with all his medication and his home exercise prescription. Will continue to monitor.  Patient demonstrates proper pursed lip breathing techniques during exercise and also demonstrates proper usage of pulse oximeter. He is able to verbalize the importance of maintaining his O2 saturations >88%. He also says he is complaint with all his medication and his home exercise prescription. Will continue to monitor.  Patient demonstrates proper pursed lip breathing techniques during exercise and also demonstrates proper usage of pulse oximeter. He is able to verbalize the importance of maintaining his O2 saturations >88%. He also says he is complaint with all his medication and his home exercise prescription. Will continue to monitor.      Goals/Expected Outcomes  Patient will continue to meet both his short and long term goals.   Patient will continue to meet both his short and long term goals.   Patient will continue to meet both his short and long term goals.         Oxygen Discharge (Final Oxygen Re-Evaluation): Oxygen Re-Evaluation - 04/12/20 1428      Program Oxygen Prescription   Program Oxygen Prescription  Continuous    Liters per minute  4    FiO2%  94      Home Oxygen   Home Oxygen Device  Home Concentrator    Sleep Oxygen Prescription  CPAP    Liters per minute  2    Home Exercise Oxygen Prescription  None    Liters per minute  3    Home at Rest Exercise Oxygen Prescription  None    Compliance with Home Oxygen Use  Yes      Goals/Expected Outcomes   Short Term Goals  To learn and exhibit compliance with exercise, home and travel O2 prescription;To learn  and understand importance of monitoring SPO2 with pulse oximeter and demonstrate accurate use of the pulse oximeter.;To learn and understand importance of maintaining oxygen saturations>88%;To learn and demonstrate proper pursed lip breathing techniques or other breathing techniques.    Long  Term Goals  Exhibits compliance with exercise, home and travel O2 prescription;Verbalizes importance of monitoring SPO2 with pulse oximeter and return demonstration;Maintenance of O2 saturations>88%;Exhibits proper breathing techniques, such as pursed lip breathing or other method taught during program session;Compliance with respiratory medication    Comments  Patient demonstrates proper pursed lip breathing techniques during exercise and also demonstrates proper usage of pulse oximeter. He is able to verbalize the importance of maintaining his O2 saturations >88%. He also says he is complaint with all his medication and his home exercise prescription. Will continue to monitor.    Goals/Expected Outcomes  Patient will continue to meet both his short and long term goals.         Initial Exercise Prescription: Initial Exercise Prescription - 02/14/20 1200      Date of Initial Exercise RX and Referring Provider   Date  02/14/20    Referring Provider  Dr. [name]    Expected Discharge Date  05/15/20      Oxygen   Oxygen  Continuous    Liters  3      Treadmill   MPH  1.2    Grade  0    Minutes  17    METs  1.91      Recumbant Elliptical   Level  1    RPM  33    Watts  30    Minutes  22    METs  1.6      Prescription Details   Frequency (times per week)  2    Duration  Progress to 30 minutes of continuous aerobic without signs/symptoms of physical distress      Intensity   THRR 40-80% of Max Heartrate  [phone]    Ratings of Perceived Exertion  11-13    Perceived Dyspnea  0-4      Progression   Progression  Continue to progress workloads to maintain intensity without signs/symptoms of physical distress.      Resistance Training   Training Prescription  Yes    Weight  1    Reps  10-15       Perform Capillary Blood Glucose checks as needed.  Exercise Prescription Changes:  Exercise Prescription Changes    Row Name 03/20/20 1500 04/03/20 1400           Response to Exercise   Blood Pressure (Admit)  140/84  120/84      Blood Pressure (Exercise)  142/106  152/98      Blood Pressure (Exit)  122/84  134/78      Heart Rate (Admit)  91 bpm  89 bpm      Heart Rate (Exercise)  115 bpm  104 bpm      Heart Rate (Exit)  97 bpm  97 bpm      Oxygen Saturation (Admit)  95 %  93 %      Oxygen Saturation (Exercise)  91 %  93 %      Oxygen Saturation (Exit)  94 %  96 %      Rating of Perceived Exertion (Exercise)  11  11      Perceived Dyspnea (Exercise)  11  11      Symptoms  --  None  Duration  Continue with 30 min of aerobic exercise without signs/symptoms of physical distress.  Continue with 30 min of aerobic exercise without signs/symptoms of physical distress.      Intensity  --  THRR New        Progression   Progression   Continue to progress workloads to maintain intensity without signs/symptoms of physical distress.  Continue to progress workloads to maintain intensity without signs/symptoms of physical distress.      Average METs  1.9  2.82        Resistance Training   Training Prescription  Yes  Yes      Weight  2  3      Reps  10-15  10-15        Oxygen   Oxygen  Continuous  Continuous      Liters  3  3        Treadmill   MPH  1.5  1.6      Grade  0  0      Minutes  17  17      METs  2.14  2.22        Recumbant Elliptical   Level  1  1      RPM  41  51      Watts  43  64      Minutes  22  22      METs  2.4  3.5        Home Exercise Plan   Plans to continue exercise at  Home (comment)  Home (comment)      Frequency  Add 3 additional days to program exercise sessions.  Add 3 additional days to program exercise sessions.      Initial Home Exercises Provided  02/14/20  02/14/20         Exercise Comments:  Exercise Comments    Row Name 02/14/20 1306 02/24/20 1421 03/20/20 1546 04/03/20 1429     Exercise Comments  This is the patients assessment/orientation visit. The patient has been in the program before and did well. He is eagar to get started  This is patients 2nd visit. He is doing well so far. We will continue to progress as tolerated.  Patient is attending regularly. Patient stated that he is using less oxygen when he travels around town. He is doing well in the program and we will continue to progress as tolerated.  Patients attendance is very consistant. He is doing well in the program. We will continue to progress as tolerated.       Exercise Goals and Review:  Exercise Goals    Row Name 02/14/20 1302             Exercise Goals   Increase Physical Activity  Yes       Intervention  Provide advice, education, support and counseling about physical activity/exercise needs.;Develop an individualized exercise prescription for aerobic and resistive training based on initial  evaluation findings, risk stratification, comorbidities and participant's personal goals.       Expected Outcomes  Short Term: Attend rehab on a regular basis to increase amount of physical activity.;Long Term: Add in home exercise to make exercise part of routine and to increase amount of physical activity.       Increase Strength and Stamina  Yes       Intervention  Provide advice, education, support and counseling about physical activity/exercise needs.;Develop an individualized exercise prescription for aerobic and resistive training based  on initial evaluation findings, risk stratification, comorbidities and participant's personal goals.       Expected Outcomes  Short Term: Perform resistance training exercises routinely during rehab and add in resistance training at home;Long Term: Improve cardiorespiratory fitness, muscular endurance and strength as measured by increased METs and functional capacity (6MWT)       Able to understand and use rate of perceived exertion (RPE) scale  Yes       Intervention  Provide education and explanation on how to use RPE scale       Expected Outcomes  Short Term: Able to use RPE daily in rehab to express subjective intensity level;Long Term:  Able to use RPE to guide intensity level when exercising independently       Able to understand and use Dyspnea scale  Yes       Intervention  Provide education and explanation on how to use Dyspnea scale       Expected Outcomes  Short Term: Able to use Dyspnea scale daily in rehab to express subjective sense of shortness of breath during exertion;Long Term: Able to use Dyspnea scale to guide intensity level when exercising independently       Knowledge and understanding of Target Heart Rate Range (THRR)  Yes       Intervention  Provide education and explanation of THRR including how the numbers were predicted and where they are located for reference       Expected Outcomes  Short Term: Able to use daily as guideline for  intensity in rehab;Long Term: Able to use THRR to govern intensity when exercising independently       Able to check pulse independently  Yes       Intervention  Provide education and demonstration on how to check pulse in carotid and radial arteries.;Review the importance of being able to check your own pulse for safety during independent exercise       Expected Outcomes  Short Term: Able to explain why pulse checking is important during independent exercise;Long Term: Able to check pulse independently and accurately       Understanding of Exercise Prescription  Yes       Intervention  Provide education, explanation, and written materials on patient's individual exercise prescription       Expected Outcomes  Short Term: Able to explain program exercise prescription;Long Term: Able to explain home exercise prescription to exercise independently          Exercise Goals Re-Evaluation : Exercise Goals Re-Evaluation    Row Name 03/20/20 1544 04/03/20 1426           Exercise Goal Re-Evaluation   Exercise Goals Review  Increase Physical Activity;Increase Strength and Stamina  Increase Physical Activity;Increase Strength and Stamina      Comments  Patient wants to breathe better and to be able to go on trips without oxygen.  Fard's goals are to breathe better and to be able to go on trips w/o oxygen. Bonnie is doing really well. He is able to tolerate his progressions w/o any any abnormal S/S. He is still not able to go without his oxygen for long periods. He workloads are increasing with each session. We will continue to monitor his progress.      Expected Outcomes  To reach expected goals  To reach his expected goals.         Discharge Exercise Prescription (Final Exercise Prescription Changes): Exercise Prescription Changes - 04/03/20 1400  Response to Exercise   Blood Pressure (Admit)  120/84    Blood Pressure (Exercise)  152/98    Blood Pressure (Exit)  134/78    Heart Rate (Admit)  89  bpm    Heart Rate (Exercise)  104 bpm    Heart Rate (Exit)  97 bpm    Oxygen Saturation (Admit)  93 %    Oxygen Saturation (Exercise)  93 %    Oxygen Saturation (Exit)  96 %    Rating of Perceived Exertion (Exercise)  11    Perceived Dyspnea (Exercise)  11    Symptoms  None    Duration  Continue with 30 min of aerobic exercise without signs/symptoms of physical distress.    Intensity  THRR New      Progression   Progression  Continue to progress workloads to maintain intensity without signs/symptoms of physical distress.    Average METs  2.82      Resistance Training   Training Prescription  Yes    Weight  3    Reps  10-15      Oxygen   Oxygen  Continuous    Liters  3      Treadmill   MPH  1.6    Grade  0    Minutes  17    METs  2.22      Recumbant Elliptical   Level  1    RPM  51    Watts  64    Minutes  22    METs  3.5      Home Exercise Plan   Plans to continue exercise at  Home (comment)    Frequency  Add 3 additional days to program exercise sessions.    Initial Home Exercises Provided  02/14/20       Nutrition:  Target Goals: Understanding of nutrition guidelines, daily intake of sodium 1500mg , cholesterol 200mg , calories 30% from fat and 7% or less from saturated fats, daily to have 5 or more servings of fruits and vegetables.  Biometrics: Pre Biometrics - 02/14/20 1310      Pre Biometrics   Height  5\' 7"  (1.702 m)    Weight  113.2 kg    Waist Circumference  45 inches    Hip Circumference  41.5 inches    Waist to Hip Ratio  1.08 %    BMI (Calculated)  39.08    Triceps Skinfold  6 mm    % Body Fat  30.3 %    Grip Strength  50.9 kg    Flexibility  0 in    Single Leg Stand  4.73 seconds        Nutrition Therapy Plan and Nutrition Goals: Nutrition Therapy & Goals - 04/12/20 1429      Personal Nutrition Goals   Comments  We continue to work with our RD to schedule classess for our new patients. He scored a 42 on his medficts diet assessment.  He says he is eating more salads and cold cuts. He only eats one big meal/day with several [place] snacks through out the day.  Will continue to montior for progress.      Intervention Plan   Intervention  Nutrition handout(s) given to patient.       Nutrition Assessments:   Nutrition Goals Re-Evaluation:   Nutrition Goals Discharge (Final Nutrition Goals Re-Evaluation):   Psychosocial: Target Goals: Acknowledge presence or absence of significant depression and/or stress, maximize coping skills, provide positive support system. Participant is able  to verbalize types and ability to use techniques and skills needed for reducing stress and depression.  Initial Review & Psychosocial Screening:   Quality of Life Scores: Quality of Life - 02/14/20 1312      Quality of Life   Select  Quality of Life      Quality of Life Scores   Health/Function Pre  20.81 %    Socioeconomic Pre  20.5 %    Psych/Spiritual Pre  21 %    Family Pre  20.4 %    GLOBAL Pre  20.72 %      Scores of 19 and below usually indicate a poorer quality of life in these areas.  A difference of  2-3 points is a clinically meaningful difference.  A difference of 2-3 points in the total score of the Quality of Life Index has been associated with significant improvement in overall quality of life, self-image, physical symptoms, and general health in studies assessing change in quality of life.   PHQ-9: Recent Review Flowsheet Data    Depression screen [facility] 2/9 02/14/2020 01/12/2020 05/26/2018 04/29/2018 02/05/2018   Decreased Interest 0 0 0 0 0   Down, Depressed, Hopeless 1 0 0 0 0   PHQ - 2 Score 1 0 0 0 0   Altered sleeping 1 - - 0 -   Tired, decreased energy 1 - - - -   Change in appetite 0 - - 0 -   Feeling bad or failure about yourself  1 - - 1 -   Trouble concentrating 1 - - 0 -   Moving slowly or fidgety/restless 0 - - 0 -   Suicidal thoughts 0 - - 0 -   PHQ-9 Score 5 - - 1 -   Difficult doing work/chores  Somewhat difficult - - - -     Interpretation of Total Score  Total Score Depression Severity:  1-4 = Minimal depression, 5-9 = Mild depression, 10-14 = Moderate depression, 15-19 = Moderately severe depression, 20-27 = Severe depression   Psychosocial Evaluation and Intervention:   Psychosocial Re-Evaluation: Psychosocial Re-Evaluation    Row Name 02/24/20 1611 03/17/20 1459 04/12/20 1430         Psychosocial Re-Evaluation   Current issues with  None Identified  None Identified  None Identified     Comments  Patient's initial QOL score is 20.72 and his PHQ-9 score is 5 with no psychosocial issues identified. Will continue to monitor.  Patient's initial QOL score is 20.72 and his PHQ-9 score is 5 with no psychosocial issues identified. Will continue to monitor.  Patient's initial QOL score is 20.72 and his PHQ-9 score is 5 with no psychosocial issues identified. Will continue to monitor.     Expected Outcomes  Patient will have no psychosocial issues identified at discharge.  Patient will have no psychosocial issues identified at discharge.  Patient will have no psychosocial issues identified at discharge.     Interventions  Stress management education;Relaxation education;Encouraged to attend Pulmonary Rehabilitation for the exercise  Stress management education;Relaxation education;Encouraged to attend Pulmonary Rehabilitation for the exercise  Stress management education;Relaxation education;Encouraged to attend Pulmonary Rehabilitation for the exercise     Continue Psychosocial Services   No Follow up required  No Follow up required  No Follow up required        Psychosocial Discharge (Final Psychosocial Re-Evaluation): Psychosocial Re-Evaluation - 04/12/20 1430      Psychosocial Re-Evaluation   Current issues with  None Identified  Comments  Patient's initial QOL score is 20.72 and his PHQ-9 score is 5 with no psychosocial issues identified. Will continue to monitor.    Expected  Outcomes  Patient will have no psychosocial issues identified at discharge.    Interventions  Stress management education;Relaxation education;Encouraged to attend Pulmonary Rehabilitation for the exercise    Continue Psychosocial Services   No Follow up required        Education: Education Goals: Education classes will be provided on a weekly basis, covering required topics. Participant will state understanding/return demonstration of topics presented.  Learning Barriers/Preferences:   Education Topics: How Lungs Work and Diseases: - Discuss the anatomy of the lungs and diseases that can affect the lungs, such as COPD.   PULMONARY REHAB OTHER RESPIRATORY from 03/09/2020 in [place]  Date  03/12/18  Educator  D. Coad  Instruction Review Code  2- Demonstrated Understanding      Exercise: -Discuss the importance of exercise, FITT principles of exercise, normal and abnormal responses to exercise, and how to exercise safely.   PULMONARY REHAB OTHER RESPIRATORY from 03/09/2020 in [place]  Date  03/05/18  Educator  [place]  Instruction Review Code  2- Demonstrated Understanding      Environmental Irritants: -Discuss types of environmental irritants and how to limit exposure to environmental irritants.   Meds/Inhalers and oxygen: - Discuss respiratory medications, definition of an inhaler and oxygen, and the proper way to use an inhaler and oxygen.   PULMONARY REHAB OTHER RESPIRATORY from 03/09/2020 in [place]  Date  03/26/18  Educator  DC      Energy Saving Techniques: - Discuss methods to conserve energy and decrease shortness of breath when performing activities of daily living.    PULMONARY REHAB OTHER RESPIRATORY from 03/09/2020 in [place]  Date  04/02/18  Educator  [place]  Instruction Review Code  2- Demonstrated Understanding      Bronchial Hygiene / Breathing Techniques: -  Discuss breathing mechanics, pursed-lip breathing technique,  proper posture, effective ways to clear airways, and other functional breathing techniques   PULMONARY REHAB OTHER RESPIRATORY from 03/09/2020 in [place]  Date  04/09/18  Educator  DC  Instruction Review Code  2- Demonstrated Understanding      Cleaning Equipment: - Provides group verbal and written instruction about the health risks of elevated stress, cause of high stress, and healthy ways to reduce stress.   PULMONARY REHAB OTHER RESPIRATORY from 03/09/2020 in [place]  Date  01/15/18  Educator  [place]  Instruction Review Code  2- Demonstrated Understanding      Nutrition I: Fats: - Discuss the types of cholesterol, what cholesterol does to the body, and how cholesterol levels can be controlled.   PULMONARY REHAB OTHER RESPIRATORY from 03/09/2020 in [place]  Date  01/22/18  Educator  [place]  Instruction Review Code  2- Demonstrated Understanding      Nutrition II: Labels: -Discuss the different components of food labels and how to read food labels.   PULMONARY REHAB OTHER RESPIRATORY from 03/09/2020 in [place]  Date  01/29/18  Educator  DJ  Instruction Review Code  2- Demonstrated Understanding      Respiratory Infections: - Discuss the signs and symptoms of respiratory infections, ways to prevent respiratory infections, and the importance of seeking medical treatment when having a respiratory infection.   PULMONARY REHAB OTHER RESPIRATORY from 03/09/2020 in  [place]  Date  02/05/18  Educator  [place]  Instruction Review Code  2- Demonstrated Understanding      Stress I: Signs and Symptoms: - Discuss the causes of stress, how stress may lead to anxiety and depression, and ways to limit stress.   Stress II: Relaxation: -Discuss relaxation techniques to limit stress.   PULMONARY REHAB OTHER RESPIRATORY  from 03/09/2020 in [place]  Date  02/19/18  Educator  [place]  Instruction Review Code  2- Demonstrated Understanding      Oxygen for Home/Travel: - Discuss how to prepare for travel when on oxygen and proper ways to transport and store oxygen to ensure safety.   Knowledge Questionnaire Score:   Core Components/Risk Factors/Patient Goals at Admission:   Core Components/Risk Factors/Patient Goals Review:  Goals and Risk Factor Review    Row Name 02/24/20 1610 03/17/20 1459 04/12/20 1430         Core Components/Risk Factors/Patient Goals Review   Personal Goals Review  Weight Management/Obesity;Improve shortness of breath with ADL's Breathe better; do more activities; go on trips w/o O2.  Weight Management/Obesity;Improve shortness of breath with ADL's Breathe better; do more activities; go on trips without using oxygen.  Weight Management/Obesity;Improve shortness of breath with ADL's Breathe better; do more activities; go on trips without O2.     Review  Patient has completed 3 sessions maintaining his weight since he started the program. He is doing well so far and hopes to meet his goals as he continues. Will continue to monitor for progress.  Patient has completed 7 sessions losing 2 lbs since last 30 day review. He continues to do well in the program with progression. He has missed some sessions recently due to low back pain per patient. He is being followed by his pcp who reports right lower quadrand abdominal pain and right loin pain. He is ordering a Renal CT for next week. Hopefully, he will be able to return to the program. Will continue to monitor for progress.  Patient has completed 13 sessions gaining 2 lbs since last 30 day review. He continues to do well in the program with progression. He reports his breathing has improved. He also reports having more energy and feeling better overall. He says he is doing more around the house. He is pleaased with his  progress in the program. Will continue to monitor for progress.     Expected Outcomes  Patient will continue to attend sessions and complete the program meeting both his personal and program goals.  Patient will continue to attend sessions and complete the program meeting both his personal and program goals.  Patient will continue to attend sessions and complete the program meeting both his personal and program goals.        Core Components/Risk Factors/Patient Goals at Discharge (Final Review):  Goals and Risk Factor Review - 04/12/20 1430      Core Components/Risk Factors/Patient Goals Review   Personal Goals Review  Weight Management/Obesity;Improve shortness of breath with ADL's   Breathe better; do more activities; go on trips without O2.   Review  Patient has completed 13 sessions gaining 2 lbs since last 30 day review. He continues to do well in the program with progression. He reports his breathing has improved. He also reports having more energy and feeling better overall. He says he is doing more around the house. He is pleaased with his progress in the program. Will continue to monitor for progress.  Expected Outcomes  Patient will continue to attend sessions and complete the program meeting both his personal and program goals.       ITP Comments:   Comments: ITP REVIEW Pt is making expected progress toward pulmonary rehab goals after completing 13 sessions. Recommend continued exercise, life style modification, education, and utilization of breathing techniques to increase stamina and strength and decrease shortness of breath with exertion.

## 2020-04-13 ENCOUNTER — Encounter (HOSPITAL_COMMUNITY)
Admission: RE | Admit: 2020-04-13 | Discharge: 2020-04-13 | Disposition: A | Discharge status: Home / Self Care | Payer: Medicare Other | Source: Ambulatory Visit | Attending: Cardiovascular Disease | Admitting: Cardiovascular Disease

## 2020-04-13 DIAGNOSIS — I5032 Chronic diastolic (congestive) heart failure: Secondary | ICD-10-CM | POA: Diagnosis not present

## 2020-04-13 NOTE — Progress Notes (Signed)
Daily Session Note  Patient Details  Name: Ricardo Burch MRN: [id number] Date of Birth: [birth year] Referring Provider:     PULMONARY REHAB OTHER RESP ORIENTATION from 02/14/2020 in [place]  Referring Provider Dr. [name]      Encounter Date: 04/13/2020  Check In:  Session Check In - 04/13/20 1445      Check-In   Supervising physician immediately available to respond to emergencies See telemetry face sheet for immediately available MD    Location [facility]    Staff Present Algis Downs, Exercise Physiologist;Debra Wynetta Emery, RN, BSN;Other    Virtual Visit No    Medication changes reported     No    Fall or balance concerns reported    No    Tobacco Cessation No Change    Warm-up and Cool-down Performed as group-led instruction    Resistance Training Performed Yes    VAD Patient? No    PAD/SET Patient? No      Pain Assessment   Currently in Pain? No/denies    Pain Score 0-No pain    Multiple Pain Sites No           Capillary Blood Glucose: No results found for this or any previous visit (from the past 24 hour(s)).    Social History   Tobacco Use  Smoking Status Former Smoker  . Packs/day: 1.00  . Years: 38.00  . Pack years: 38.00  . Types: Cigarettes  . Quit date: 02/21/2020  . Years since quitting: 0.1  Smokeless Tobacco Never Used    Goals Met:  Proper associated with RPD/PD & O2 Sat Independence with exercise equipment Improved SOB with ADL's Using PLB without cueing & demonstrates good technique Exercise tolerated well Personal goals reviewed No report of cardiac concerns or symptoms Strength training completed today  Goals Unmet:  Not Applicable  Comments: check out 15:45   Dr. Kathie Dike is Medical Director for [facility].

## 2020-04-14 ENCOUNTER — Other Ambulatory Visit: Payer: Self-pay | Admitting: *Deleted

## 2020-04-14 MED ORDER — ALBUTEROL SULFATE HFA 108 (90 BASE) MCG/ACT IN AERS: INHALATION_SPRAY | RESPIRATORY_TRACT | 4 refills | Status: DC

## 2020-04-18 ENCOUNTER — Encounter (HOSPITAL_COMMUNITY): Payer: Medicare Other

## 2020-04-19 ENCOUNTER — Ambulatory Visit (INDEPENDENT_AMBULATORY_CARE_PROVIDER_SITE_OTHER): Payer: Medicare Other

## 2020-04-19 ENCOUNTER — Ambulatory Visit (INDEPENDENT_AMBULATORY_CARE_PROVIDER_SITE_OTHER): Payer: Medicare Other | Admitting: Internal Medicine

## 2020-04-19 ENCOUNTER — Other Ambulatory Visit: Payer: Self-pay

## 2020-04-19 VITALS — BP 144/90 | HR 87 | Temp 97.7°F | Resp 18 | Ht 68.0 in | Wt 246.0 lb

## 2020-04-19 DIAGNOSIS — E291 Testicular hypofunction: Secondary | ICD-10-CM

## 2020-04-19 DIAGNOSIS — N5201 Erectile dysfunction due to arterial insufficiency: Secondary | ICD-10-CM

## 2020-04-19 NOTE — Progress Notes (Signed)
Pt rt thigh of 0.5 ML. Pt tolerated well.

## 2020-04-20 ENCOUNTER — Encounter (HOSPITAL_COMMUNITY)
Admission: RE | Admit: 2020-04-20 | Discharge: 2020-04-20 | Disposition: A | Discharge status: Home / Self Care | Payer: Medicare Other | Source: Ambulatory Visit | Attending: Cardiovascular Disease | Admitting: Cardiovascular Disease

## 2020-04-20 VITALS — Wt 250.0 lb

## 2020-04-20 DIAGNOSIS — I5032 Chronic diastolic (congestive) heart failure: Secondary | ICD-10-CM | POA: Diagnosis not present

## 2020-04-20 DIAGNOSIS — J449 Chronic obstructive pulmonary disease, unspecified: Secondary | ICD-10-CM

## 2020-04-20 NOTE — Progress Notes (Signed)
Daily Session Note  Patient Details  Name: Ricardo Burch MRN: [id number] Date of Birth: July 07, 1961 Referring Provider:     PULMONARY REHAB OTHER RESP ORIENTATION from 02/14/2020 in [place]  Referring Provider Dr. [name]      Encounter Date: 04/20/2020  Check In:  Session Check In - 04/20/20 1440      Check-In   Supervising physician immediately available to respond to emergencies See telemetry face sheet for immediately available MD    Location [facility]    Staff Present Aundra Dubin, RN, Bjorn Loser, MS, ACSM-CEP, Exercise Physiologist;Vaniece Hatchett, Exercise Physiologist    Virtual Visit No    Medication changes reported     No    Fall or balance concerns reported    No    Tobacco Cessation No Change    Warm-up and Cool-down Performed as group-led instruction    Resistance Training Performed Yes    VAD Patient? No    PAD/SET Patient? No      Pain Assessment   Currently in Pain? No/denies    Pain Score 0-No pain    Multiple Pain Sites No           Capillary Blood Glucose: No results found for this or any previous visit (from the past 24 hour(s)).    Social History   Tobacco Use  Smoking Status Former Smoker  . Packs/day: 1.00  . Years: 38.00  . Pack years: 38.00  . Types: Cigarettes  . Quit date: 02/21/2020  . Years since quitting: 0.1  Smokeless Tobacco Never Used    Goals Met:  Independence with exercise equipment Exercise tolerated well Strength training completed today  Goals Unmet:  Not Applicable  Comments: checkout time is 1545   Dr. Kate Sable is Medical Director for [facility] [place] and Pulmonary Rehab.

## 2020-04-25 ENCOUNTER — Encounter (HOSPITAL_COMMUNITY): Payer: Medicare Other

## 2020-04-26 ENCOUNTER — Other Ambulatory Visit (INDEPENDENT_AMBULATORY_CARE_PROVIDER_SITE_OTHER): Payer: Self-pay

## 2020-04-26 ENCOUNTER — Ambulatory Visit (INDEPENDENT_AMBULATORY_CARE_PROVIDER_SITE_OTHER): Payer: Medicare Other

## 2020-04-26 MED ORDER — AMLODIPINE BESYLATE 5 MG PO TABS: 5.0000 mg | ORAL_TABLET | Freq: Every day | ORAL | 0 refills | Status: DC

## 2020-04-27 ENCOUNTER — Encounter (HOSPITAL_COMMUNITY): Payer: Medicare Other

## 2020-05-02 ENCOUNTER — Other Ambulatory Visit: Payer: Self-pay

## 2020-05-02 ENCOUNTER — Encounter (HOSPITAL_COMMUNITY)
Admission: RE | Admit: 2020-05-02 | Discharge: 2020-05-02 | Disposition: A | Discharge status: Home / Self Care | Payer: Medicare Other | Source: Ambulatory Visit | Attending: Cardiovascular Disease | Admitting: Cardiovascular Disease

## 2020-05-02 VITALS — Wt 252.9 lb

## 2020-05-02 DIAGNOSIS — I5032 Chronic diastolic (congestive) heart failure: Secondary | ICD-10-CM

## 2020-05-02 DIAGNOSIS — J449 Chronic obstructive pulmonary disease, unspecified: Secondary | ICD-10-CM

## 2020-05-02 NOTE — Progress Notes (Signed)
Daily Session Note  Patient Details  Name: Ricardo Burch MRN: [id number] Date of Birth: 10/09/1961 Referring Provider:     PULMONARY REHAB OTHER RESP ORIENTATION from 02/14/2020 in [place] [facility]  Referring Provider Dr. Koneswaran      Encounter Date: 05/02/2020  Check In:  Session Check In - 05/02/20 1440      Check-In   Supervising physician immediately available to respond to emergencies See telemetry face sheet for immediately available MD    Location [facility]    Staff Present Phyllis Billingsley, RN;Vaniece Hatchett, Exercise Physiologist;Annedrea Stackhouse, RN, MHA    Virtual Visit No    Medication changes reported     No    Fall or balance concerns reported    No    Tobacco Cessation No Change    Warm-up and Cool-down Performed as group-led instruction    Resistance Training Performed Yes    VAD Patient? No    PAD/SET Patient? No      Pain Assessment   Currently in Pain? No/denies    Pain Score 0-No pain    Multiple Pain Sites No           Capillary Blood Glucose: No results found for this or any previous visit (from the past 24 hour(s)).    Social History   Tobacco Use  Smoking Status Former Smoker  . Packs/day: 1.00  . Years: 38.00  . Pack years: 38.00  . Types: Cigarettes  . Quit date: 02/21/2020  . Years since quitting: 0.1  Smokeless Tobacco Never Used    Goals Met:  Proper associated with RPD/PD & O2 Sat Independence with exercise equipment Exercise tolerated well Strength training completed today  Goals Unmet:  Not Applicable  Comments: session 2:45p-3:45p   Dr. Jehanzeb Memon is Medical Director for [facility]. 

## 2020-05-03 ENCOUNTER — Other Ambulatory Visit: Payer: Self-pay

## 2020-05-03 ENCOUNTER — Ambulatory Visit (INDEPENDENT_AMBULATORY_CARE_PROVIDER_SITE_OTHER): Payer: Medicare Other

## 2020-05-03 ENCOUNTER — Other Ambulatory Visit: Payer: Self-pay | Admitting: Pulmonary Disease

## 2020-05-03 DIAGNOSIS — F524 Premature ejaculation: Secondary | ICD-10-CM

## 2020-05-03 DIAGNOSIS — E291 Testicular hypofunction: Secondary | ICD-10-CM | POA: Diagnosis not present

## 2020-05-03 DIAGNOSIS — N5201 Erectile dysfunction due to arterial insufficiency: Secondary | ICD-10-CM

## 2020-05-03 DIAGNOSIS — J438 Other emphysema: Secondary | ICD-10-CM

## 2020-05-04 ENCOUNTER — Other Ambulatory Visit (INDEPENDENT_AMBULATORY_CARE_PROVIDER_SITE_OTHER): Payer: Self-pay

## 2020-05-04 ENCOUNTER — Encounter (HOSPITAL_COMMUNITY): Payer: Medicare Other

## 2020-05-04 MED ORDER — AMLODIPINE BESYLATE 5 MG PO TABS: 5.0000 mg | ORAL_TABLET | Freq: Every day | ORAL | 0 refills | Status: DC

## 2020-05-09 ENCOUNTER — Other Ambulatory Visit: Payer: Self-pay

## 2020-05-09 ENCOUNTER — Encounter (HOSPITAL_COMMUNITY)
Admission: RE | Admit: 2020-05-09 | Discharge: 2020-05-09 | Disposition: A | Discharge status: Home / Self Care | Payer: Medicare Other | Source: Ambulatory Visit | Attending: Cardiovascular Disease | Admitting: Cardiovascular Disease

## 2020-05-09 DIAGNOSIS — I5032 Chronic diastolic (congestive) heart failure: Secondary | ICD-10-CM

## 2020-05-09 DIAGNOSIS — J449 Chronic obstructive pulmonary disease, unspecified: Secondary | ICD-10-CM

## 2020-05-09 NOTE — Progress Notes (Signed)
Daily Session Note  Patient Details  Name: Ricardo Burch MRN: [id number] Date of Birth: Mar 20, 1961 Referring Provider:     PULMONARY REHAB OTHER RESP ORIENTATION from 02/14/2020 in [place]  Referring Provider Dr. [name]      Encounter Date: 05/09/2020  Check In:  Session Check In - 05/09/20 1454      Check-In   Supervising physician immediately available to respond to emergencies See telemetry face sheet for immediately available MD    Location [facility]    Staff Present Geanie Cooley, RN;Vaniece Hatchett, Exercise Physiologist;Adalbert Alberto Rosezella [place], RN, [place]    Virtual Visit No    Medication changes reported     No    Fall or balance concerns reported    No    Tobacco Cessation No Change    Warm-up and Cool-down Performed as group-led instruction    Resistance Training Performed Yes    VAD Patient? No    PAD/SET Patient? No      Pain Assessment   Currently in Pain? No/denies    Pain Score 0-No pain    Multiple Pain Sites No           Capillary Blood Glucose: No results found for this or any previous visit (from the past 24 hour(s)).    Social History   Tobacco Use  Smoking Status Former Smoker  . Packs/day: 1.00  . Years: 38.00  . Pack years: 38.00  . Types: Cigarettes  . Quit date: 02/21/2020  . Years since quitting: 0.2  Smokeless Tobacco Never Used    Goals Met:  Proper associated with RPD/PD & O2 Sat Independence with exercise equipment Using PLB without cueing & demonstrates good technique Exercise tolerated well Strength training completed today  Goals Unmet:  Not Applicable  Comments: [id number]   Dr. Kathie Dike is Medical Director for [facility].

## 2020-05-10 ENCOUNTER — Ambulatory Visit (INDEPENDENT_AMBULATORY_CARE_PROVIDER_SITE_OTHER): Payer: Medicare Other

## 2020-05-10 VITALS — BP 120/74 | HR 86 | Temp 97.7°F | Resp 19 | Wt 250.0 lb

## 2020-05-10 DIAGNOSIS — N5201 Erectile dysfunction due to arterial insufficiency: Secondary | ICD-10-CM | POA: Diagnosis not present

## 2020-05-10 DIAGNOSIS — E291 Testicular hypofunction: Secondary | ICD-10-CM

## 2020-05-10 NOTE — Progress Notes (Signed)
Pt came to get weekly dose of Testosterone injection. Pt was given 0.78mL; not enough in vials to give 0.35mL. Pt tolerated well; no complaints.

## 2020-05-11 ENCOUNTER — Encounter (HOSPITAL_COMMUNITY)
Admission: RE | Admit: 2020-05-11 | Discharge: 2020-05-11 | Disposition: A | Discharge status: Home / Self Care | Payer: Medicare Other | Source: Ambulatory Visit | Attending: Cardiovascular Disease | Admitting: Cardiovascular Disease

## 2020-05-11 ENCOUNTER — Other Ambulatory Visit: Payer: Self-pay

## 2020-05-11 DIAGNOSIS — J449 Chronic obstructive pulmonary disease, unspecified: Secondary | ICD-10-CM

## 2020-05-11 DIAGNOSIS — I5032 Chronic diastolic (congestive) heart failure: Secondary | ICD-10-CM

## 2020-05-11 NOTE — Progress Notes (Signed)
Pulmonary Individual Treatment Plan  Patient Details  Name: Ricardo Burch MRN: [id number] Date of Birth: May 18, 1961 Referring Provider:     PULMONARY REHAB OTHER RESP ORIENTATION from 02/14/2020 in [place]  Referring Provider Dr. [name]      Initial Encounter Date:    [place] from 02/14/2020 in [place]  Date 02/14/20      Visit Diagnosis: Chronic diastolic CHF (congestive heart failure) (HCC)  Chronic obstructive pulmonary disease, unspecified COPD type ([place])  Patient's Home Medications on Admission:   Current Outpatient Medications:  .  albuterol (VENTOLIN HFA) 108 (90 Base) MCG/ACT inhaler, Inhale 1 or 2 puffs by mouth every 6 hours as needed for wheezing or shortness of breath, Disp: 18 g, Rfl: 4 .  amLODipine (NORVASC) 5 MG tablet, Take 1 tablet (5 mg total) by mouth daily., Disp: 90 tablet, Rfl: 0 .  BREZTRI AEROSPHERE 160-9-4.8 MCG/ACT AERO, INHALE 2 PUFFS BY MOUTH TWICE DAILY, Disp: 10.7 g, Rfl: 5 .  Cholecalciferol (VITAMIN D-3) 125 MCG (5000 UT) TABS, Take 2 tablets by mouth daily., Disp: , Rfl:  .  fluticasone (FLONASE) 50 MCG/ACT nasal spray, INSTILL 2 SPRAYS IN EACH NOSTRIL TWICE DAILY, Disp: 16 mL, Rfl: 5 .  ipratropium-albuterol (DUONEB) 0.5-2.5 (3) MG/3ML SOLN, Take 3 mLs by nebulization every 4 (four) hours as needed., Disp: 360 mL, Rfl: 11 .  meloxicam (MOBIC) 7.5 MG tablet, Take 1 tablet (7.5 mg total) by mouth daily., Disp: 30 tablet, Rfl: 5 .  OXYGEN, Inhale 3 L into the lungs daily. continuous, Disp: , Rfl:  .  pantoprazole (PROTONIX) 40 MG tablet, Take 1 tablet (40 mg total) by mouth daily., Disp: 90 tablet, Rfl: 3 .  roflumilast (DALIRESP) 500 MCG TABS tablet, Take 1 tablet (500 mcg total) by mouth daily., Disp: 30 tablet, Rfl: 5 .  tadalafil (CIALIS) 20 MG tablet, Take 0.5 tablets (10 mg total) by mouth daily as needed for erectile dysfunction., Disp: 10 tablet, Rfl: 2 .   testosterone cypionate (DEPOTESTOSTERONE CYPIONATE) 200 MG/ML injection, Inject 0.6 mLs (120 mg total) into the muscle once a week. 0.6ml/week, Disp: 10 mL, Rfl: 1  Current Facility-Administered Medications:  .  testosterone cypionate (DEPOTESTOSTERONE CYPIONATE) injection 200 mg, 200 mg, Intramuscular, Q14 Days, Gosrani, Nimish C, MD, 200 mg at 05/10/20 1018  Past Medical History: Past Medical History:  Diagnosis Date  . Allergy   . CHF (congestive heart failure) ([place])   . Chronic kidney disease   . COPD (chronic obstructive pulmonary disease) ([place]) Dx 2015  . Depression   . Eczema    since childhood   . Erectile dysfunction 08/18/2019  . GERD (gastroesophageal reflux disease) 01/25/2020  . Hyperlipidemia 01/12/2018  . Hypertension   . Oxygen deficiency   . Sleep apnea    CPAP  . Substance abuse ([place])    MJ, cocaine  . Testicular failure 07/22/2019    Tobacco Use: Social History   Tobacco Use  Smoking Status Former Smoker  . Packs/day: 1.00  . Years: 38.00  . Pack years: 38.00  . Types: Cigarettes  . Quit date: 02/21/2020  . Years since quitting: 0.2  Smokeless Tobacco Never Used    Labs: Recent Review Flowsheet Data    Labs for ITP Cardiac and Pulmonary Rehab Latest Ref Rng & Units 07/27/2016 10/14/2017 09/23/2018 09/24/2018 07/22/2019   Cholestrol <200 mg/dL - 136 - 91 122   LDLCALC mg/dL (calc) - 60 - 49 68   HDL >  OR = 40 mg/dL - 60 - 33(L) 40   Trlycerides <150 mg/dL - 82 - 43 67   Hemoglobin A1c 4.8 - 5.6 % - 5.3 5.5 - -   PHART 7.35 - 7.45 7.341(L) - - - -   PCO2ART 32 - 48 mmHg 84.2(HH) - - - -   HCO3 20.0 - 28.0 mmol/L 44.3(H) - - - -   TCO2 0 - 100 mmol/L - - - - -   O2SAT % 92.0 - - - -      Capillary Blood Glucose: Lab Results  Component Value Date   GLUCAP 84 11/20/2019   GLUCAP 93 05/12/2018   GLUCAP 179 (H) 07/27/2016   GLUCAP 156 (H) 07/27/2016   GLUCAP 140 (H) 09/16/2015     Pulmonary Assessment Scores:  Pulmonary Assessment Scores     Row Name 02/14/20 1338         ADL UCSD   ADL Phase Entry     SOB Score total 57     Rest 0     Walk 7     Stairs 4     Bath 3     Dress 2     Shop 3       CAT Score   CAT Score 17       mMRC Score   mMRC Score 4           UCSD: Self-administered rating of dyspnea associated with activities of daily living (ADLs) 6-point scale (0 = "not at all" to 5 = "maximal or unable to do because of breathlessness")  Scoring Scores range from 0 to 120.  Minimally important difference is 5 units  CAT: CAT can identify the health impairment of COPD patients and is better correlated with disease progression.  CAT has a scoring range of zero to 40. The CAT score is classified into four groups of low (less than 10), medium (10 - 20), high (21-30) and very high (31-40) based on the impact level of disease on health status. A CAT score over 10 suggests significant symptoms.  A worsening CAT score could be explained by an exacerbation, poor medication adherence, poor inhaler technique, or progression of COPD or comorbid conditions.  CAT MCID is 2 points  mMRC: mMRC (Modified Medical Research Council) Dyspnea Scale is used to assess the degree of baseline functional disability in patients of respiratory disease due to dyspnea. No minimal important difference is established. A decrease in score of 1 point or greater is considered a positive change.   Pulmonary Function Assessment:   Exercise Target Goals: Exercise Program Goal: Individual exercise prescription set using results from initial 6 min walk test and THRR while considering  patient's activity barriers and safety.   Exercise Prescription Goal: Initial exercise prescription builds to 30-45 minutes a day of aerobic activity, 2-3 days per week.  Home exercise guidelines will be given to patient during program as part of exercise prescription that the participant will acknowledge.  Activity Barriers & Risk Stratification:  Activity  Barriers & Cardiac Risk Stratification - 02/14/20 1251      Activity Barriers & Cardiac Risk Stratification   Activity Barriers Deconditioning;Shortness of Breath    Cardiac Risk Stratification Moderate           6 Minute Walk:  6 Minute Walk    Row Name 02/14/20 1247         6 Minute Walk   Phase Initial     Distance 800 feet  Walk Time 6 minutes     # of Rest Breaks 0     MPH 1.51     METS 2.16     RPE 12     Perceived Dyspnea  14     VO2 Peak 7.83     Symptoms Yes (comment)     Comments SOB     Resting HR 83 bpm     Resting BP 130/90     Resting Oxygen Saturation  96 %     Exercise Oxygen Saturation  during 6 min walk 89 %     Max Ex. HR 99 bpm     Max Ex. BP 148/92     2 Minute Post BP 120/88            Oxygen Initial Assessment:  Oxygen Initial Assessment - 02/14/20 1336      Home Oxygen   Home Oxygen Device Home Concentrator    Sleep Oxygen Prescription CPAP    Liters per minute 3    Home Exercise Oxygen Prescription Continuous    Liters per minute 2    Home at Rest Exercise Oxygen Prescription Continuous    Liters per minute 3    Compliance with Home Oxygen Use Yes      Initial 6 min Walk   Oxygen Used Continuous    Liters per minute 4      Program Oxygen Prescription   Program Oxygen Prescription Continuous    Liters per minute 4      Intervention   Short Term Goals To learn and exhibit compliance with exercise, home and travel O2 prescription;To learn and understand importance of monitoring SPO2 with pulse oximeter and demonstrate accurate use of the pulse oximeter.;To learn and understand importance of maintaining oxygen saturations>88%    Long  Term Goals Exhibits compliance with exercise, home and travel O2 prescription;Verbalizes importance of monitoring SPO2 with pulse oximeter and return demonstration;Maintenance of O2 saturations>88%;Exhibits proper breathing techniques, such as pursed lip breathing or other method taught during  program session           Oxygen Re-Evaluation:  Oxygen Re-Evaluation    Row Name 02/24/20 1556 03/17/20 1458 04/12/20 1428 05/11/20 0758       Program Oxygen Prescription   Program Oxygen Prescription None Continuous Continuous Continuous    Liters per minute 4 4 4 4     FiO2% 93 93 94 93      Home Oxygen   Home Oxygen Device Home Concentrator Home Concentrator Home Concentrator Home Concentrator    Sleep Oxygen Prescription CPAP CPAP CPAP CPAP    Liters per minute 3 3 2 2     Home Exercise Oxygen Prescription Continuous Continuous None Continuous    Liters per minute 3 3 3 3     Home at Rest Exercise Oxygen Prescription Continuous None None None    Liters per minute 3 3 -- --    Compliance with Home Oxygen Use Yes Yes Yes Yes      Goals/Expected Outcomes   Short Term Goals To learn and exhibit compliance with exercise, home and travel O2 prescription;To learn and understand importance of monitoring SPO2 with pulse oximeter and demonstrate accurate use of the pulse oximeter.;To learn and understand importance of maintaining oxygen saturations>88%;To learn and demonstrate proper pursed lip breathing techniques or other breathing techniques. To learn and exhibit compliance with exercise, home and travel O2 prescription;To learn and understand importance of monitoring SPO2 with pulse oximeter and demonstrate accurate use of the  pulse oximeter.;To learn and understand importance of maintaining oxygen saturations>88%;To learn and demonstrate proper pursed lip breathing techniques or other breathing techniques. To learn and exhibit compliance with exercise, home and travel O2 prescription;To learn and understand importance of monitoring SPO2 with pulse oximeter and demonstrate accurate use of the pulse oximeter.;To learn and understand importance of maintaining oxygen saturations>88%;To learn and demonstrate proper pursed lip breathing techniques or other breathing techniques. To learn and  exhibit compliance with exercise, home and travel O2 prescription;To learn and understand importance of monitoring SPO2 with pulse oximeter and demonstrate accurate use of the pulse oximeter.;To learn and understand importance of maintaining oxygen saturations>88%;To learn and demonstrate proper pursed lip breathing techniques or other breathing techniques.    Long  Term Goals Exhibits compliance with exercise, home and travel O2 prescription;Verbalizes importance of monitoring SPO2 with pulse oximeter and return demonstration;Maintenance of O2 saturations>88%;Exhibits proper breathing techniques, such as pursed lip breathing or other method taught during program session;Compliance with respiratory medication Exhibits compliance with exercise, home and travel O2 prescription;Verbalizes importance of monitoring SPO2 with pulse oximeter and return demonstration;Maintenance of O2 saturations>88%;Exhibits proper breathing techniques, such as pursed lip breathing or other method taught during program session;Compliance with respiratory medication Exhibits compliance with exercise, home and travel O2 prescription;Verbalizes importance of monitoring SPO2 with pulse oximeter and return demonstration;Maintenance of O2 saturations>88%;Exhibits proper breathing techniques, such as pursed lip breathing or other method taught during program session;Compliance with respiratory medication Exhibits compliance with exercise, home and travel O2 prescription;Verbalizes importance of monitoring SPO2 with pulse oximeter and return demonstration;Maintenance of O2 saturations>88%;Exhibits proper breathing techniques, such as pursed lip breathing or other method taught during program session;Compliance with respiratory medication    Comments Patient demonstrates proper pursed lip breathing techniques during exercise and also demonstrates proper usage of pulse oximeter. He is able to verbalize the importance of maintaining his O2  saturations >88%. He also says he is complaint with all his medication and his home exercise prescription. Will continue to monitor. Patient demonstrates proper pursed lip breathing techniques during exercise and also demonstrates proper usage of pulse oximeter. He is able to verbalize the importance of maintaining his O2 saturations >88%. He also says he is complaint with all his medication and his home exercise prescription. Will continue to monitor. Patient demonstrates proper pursed lip breathing techniques during exercise and also demonstrates proper usage of pulse oximeter. He is able to verbalize the importance of maintaining his O2 saturations >88%. He also says he is complaint with all his medication and his home exercise prescription. Will continue to monitor. Patient demonstrates proper pursed lip breathing techniques during exercise and also demonstrates proper usage of pulse oximeter. He is able to verbalize the importance of maintaining his O2 saturations >88%. He also says he is complaint with all his medication and his home exercise prescription. Will continue to monitor.    Goals/Expected Outcomes Patient will continue to meet both his short and long term goals.  Patient will continue to meet both his short and long term goals.  Patient will continue to meet both his short and long term goals.  Patient will continue to meet both his short and long term goals.            Oxygen Discharge (Final Oxygen Re-Evaluation):  Oxygen Re-Evaluation - 05/11/20 0758      Program Oxygen Prescription   Program Oxygen Prescription Continuous    Liters per minute 4    FiO2% 93  Home Oxygen   Home Oxygen Device Home Concentrator    Sleep Oxygen Prescription CPAP    Liters per minute 2    Home Exercise Oxygen Prescription Continuous    Liters per minute 3    Home at Rest Exercise Oxygen Prescription None    Compliance with Home Oxygen Use Yes      Goals/Expected Outcomes   Short Term Goals  To learn and exhibit compliance with exercise, home and travel O2 prescription;To learn and understand importance of monitoring SPO2 with pulse oximeter and demonstrate accurate use of the pulse oximeter.;To learn and understand importance of maintaining oxygen saturations>88%;To learn and demonstrate proper pursed lip breathing techniques or other breathing techniques.    Long  Term Goals Exhibits compliance with exercise, home and travel O2 prescription;Verbalizes importance of monitoring SPO2 with pulse oximeter and return demonstration;Maintenance of O2 saturations>88%;Exhibits proper breathing techniques, such as pursed lip breathing or other method taught during program session;Compliance with respiratory medication    Comments Patient demonstrates proper pursed lip breathing techniques during exercise and also demonstrates proper usage of pulse oximeter. He is able to verbalize the importance of maintaining his O2 saturations >88%. He also says he is complaint with all his medication and his home exercise prescription. Will continue to monitor.    Goals/Expected Outcomes Patient will continue to meet both his short and long term goals.            Initial Exercise Prescription:  Initial Exercise Prescription - 02/14/20 1200      Date of Initial Exercise RX and Referring Provider   Date 02/14/20    Referring Provider Dr. [name]    Expected Discharge Date 05/15/20      Oxygen   Oxygen Continuous    Liters 3      Treadmill   MPH 1.2    Grade 0    Minutes 17    METs 1.91      Recumbant Elliptical   Level 1    RPM 33    Watts 30    Minutes 22    METs 1.6      Prescription Details   Frequency (times per week) 2    Duration Progress to 30 minutes of continuous aerobic without signs/symptoms of physical distress      Intensity   THRR 40-80% of Max Heartrate [phone]    Ratings of Perceived Exertion 11-13    Perceived Dyspnea 0-4      Progression   Progression  Continue to progress workloads to maintain intensity without signs/symptoms of physical distress.      Resistance Training   Training Prescription Yes    Weight 1    Reps 10-15           Perform Capillary Blood Glucose checks as needed.  Exercise Prescription Changes:   Exercise Prescription Changes    Row Name 03/20/20 1500 04/03/20 1400 04/17/20 1500 04/20/20 1319       Response to Exercise   Blood Pressure (Admit) 140/84 120/84 122/62 126/74    Blood Pressure (Exercise) 142/106 152/98 150/100 162/70    Blood Pressure (Exit) 122/84 134/78 120/86 136/80    Heart Rate (Admit) 91 bpm 89 bpm 81 bpm 77 bpm    Heart Rate (Exercise) 115 bpm 104 bpm 101 bpm 101 bpm    Heart Rate (Exit) 97 bpm 97 bpm 89 bpm 95 bpm    Oxygen Saturation (Admit) 95 % 93 % 93 % 94 %    Oxygen Saturation (  Exercise) 91 % 93 % 92 % 93 %    Oxygen Saturation (Exit) 94 % 96 % 95 % 94 %    Rating of Perceived Exertion (Exercise) 11 11 12 11     Perceived Dyspnea (Exercise) 11 11 12 12     Symptoms -- None -- --    Duration Continue with 30 min of aerobic exercise without signs/symptoms of physical distress. Continue with 30 min of aerobic exercise without signs/symptoms of physical distress. Continue with 30 min of aerobic exercise without signs/symptoms of physical distress. Continue with 30 min of aerobic exercise without signs/symptoms of physical distress.    Intensity -- THRR New THRR unchanged THRR unchanged      Progression   Progression Continue to progress workloads to maintain intensity without signs/symptoms of physical distress. Continue to progress workloads to maintain intensity without signs/symptoms of physical distress. Continue to progress workloads to maintain intensity without signs/symptoms of physical distress. Continue to progress workloads to maintain intensity without signs/symptoms of physical distress.    Average METs 1.9 2.82 -- --      Resistance Training   Training Prescription Yes  Yes Yes Yes    Weight 2 3 3 3     Reps 10-15 10-15 10-15 10-15      Oxygen   Oxygen Continuous Continuous Continuous Continuous    Liters 3 3 3 3       Treadmill   MPH 1.5 1.6 1.6 1.6    Grade 0 0 0 0    Minutes 17 17 17 17     METs 2.14 2.22 2.22 2.22      Recumbant Elliptical   Level 1 1 1 1     RPM 41 51 47 51    Watts 43 64 53 55    Minutes 22 22 22 22     METs 2.4 3.5 3 2.6      Home Exercise Plan   Plans to continue exercise at Home (comment) Home (comment) -- --    Frequency Add 3 additional days to program exercise sessions. Add 3 additional days to program exercise sessions. -- --    Initial Home Exercises Provided 02/14/20 02/14/20 -- --           Exercise Comments:   Exercise Comments    Row Name 02/14/20 1306 02/24/20 1421 03/20/20 1546 04/03/20 1429     Exercise Comments This is the patients assessment/orientation visit. The patient has been in the program before and did well. He is eagar to get started This is patients 2nd visit. He is doing well so far. We will continue to progress as tolerated. Patient is attending regularly. Patient stated that he is using less oxygen when he travels around town. He is doing well in the program and we will continue to progress as tolerated. Patients attendance is very consistant. He is doing well in the program. We will continue to progress as tolerated.           Exercise Goals and Review:   Exercise Goals    Row Name 02/14/20 1302             Exercise Goals   Increase Physical Activity Yes       Intervention Provide advice, education, support and counseling about physical activity/exercise needs.;Develop an individualized exercise prescription for aerobic and resistive training based on initial evaluation findings, risk stratification, comorbidities and participant's personal goals.       Expected Outcomes Short Term: Attend rehab on a regular basis to increase  amount of physical activity.;Long Term: Add in home  exercise to make exercise part of routine and to increase amount of physical activity.       Increase Strength and Stamina Yes       Intervention Provide advice, education, support and counseling about physical activity/exercise needs.;Develop an individualized exercise prescription for aerobic and resistive training based on initial evaluation findings, risk stratification, comorbidities and participant's personal goals.       Expected Outcomes Short Term: Perform resistance training exercises routinely during rehab and add in resistance training at home;Long Term: Improve cardiorespiratory fitness, muscular endurance and strength as measured by increased METs and functional capacity (6MWT)       Able to understand and use rate of perceived exertion (RPE) scale Yes       Intervention Provide education and explanation on how to use RPE scale       Expected Outcomes Short Term: Able to use RPE daily in rehab to express subjective intensity level;Long Term:  Able to use RPE to guide intensity level when exercising independently       Able to understand and use Dyspnea scale Yes       Intervention Provide education and explanation on how to use Dyspnea scale       Expected Outcomes Short Term: Able to use Dyspnea scale daily in rehab to express subjective sense of shortness of breath during exertion;Long Term: Able to use Dyspnea scale to guide intensity level when exercising independently       Knowledge and understanding of Target Heart Rate Range (THRR) Yes       Intervention Provide education and explanation of THRR including how the numbers were predicted and where they are located for reference       Expected Outcomes Short Term: Able to use daily as guideline for intensity in rehab;Long Term: Able to use THRR to govern intensity when exercising independently       Able to check pulse independently Yes       Intervention Provide education and demonstration on how to check pulse in carotid and radial  arteries.;Review the importance of being able to check your own pulse for safety during independent exercise       Expected Outcomes Short Term: Able to explain why pulse checking is important during independent exercise;Long Term: Able to check pulse independently and accurately       Understanding of Exercise Prescription Yes       Intervention Provide education, explanation, and written materials on patient's individual exercise prescription       Expected Outcomes Short Term: Able to explain program exercise prescription;Long Term: Able to explain home exercise prescription to exercise independently              Exercise Goals Re-Evaluation :  Exercise Goals Re-Evaluation    Row Name 03/20/20 1544 04/03/20 1426 05/05/20 1553         Exercise Goal Re-Evaluation   Exercise Goals Review Increase Physical Activity;Increase Strength and Stamina Increase Physical Activity;Increase Strength and Stamina Increase Physical Activity;Increase Strength and Stamina     Comments Patient wants to breathe better and to be able to go on trips without oxygen. Amelio's goals are to breathe better and to be able to go on trips w/o oxygen. Verdon is doing really well. He is able to tolerate his progressions w/o any any abnormal S/S. He is still not able to go without his oxygen for long periods. He workloads are increasing with each session.  We will continue to monitor his progress. He is breathing alot better. He is progressing at a steady pace. He shows great effort toward reducing his use of continuous oxgen. We will continue to progress as tolerated     Expected Outcomes To reach expected goals To reach his expected goals. To reach expected goals            Discharge Exercise Prescription (Final Exercise Prescription Changes):  Exercise Prescription Changes - 04/20/20 1319      Response to Exercise   Blood Pressure (Admit) 126/74    Blood Pressure (Exercise) 162/70    Blood Pressure (Exit) 136/80     Heart Rate (Admit) 77 bpm    Heart Rate (Exercise) 101 bpm    Heart Rate (Exit) 95 bpm    Oxygen Saturation (Admit) 94 %    Oxygen Saturation (Exercise) 93 %    Oxygen Saturation (Exit) 94 %    Rating of Perceived Exertion (Exercise) 11    Perceived Dyspnea (Exercise) 12    Duration Continue with 30 min of aerobic exercise without signs/symptoms of physical distress.    Intensity THRR unchanged      Progression   Progression Continue to progress workloads to maintain intensity without signs/symptoms of physical distress.      Resistance Training   Training Prescription Yes    Weight 3    Reps 10-15      Oxygen   Oxygen Continuous    Liters 3      Treadmill   MPH 1.6    Grade 0    Minutes 17    METs 2.22      Recumbant Elliptical   Level 1    RPM 51    Watts 55    Minutes 22    METs 2.6           Nutrition:  Target Goals: Understanding of nutrition guidelines, daily intake of sodium 1500mg , cholesterol 200mg , calories 30% from fat and 7% or less from saturated fats, daily to have 5 or more servings of fruits and vegetables.  Biometrics:  Pre Biometrics - 02/14/20 1310      Pre Biometrics   Height 5\' 7"  (1.702 m)    Weight 113.2 kg    Waist Circumference 45 inches    Hip Circumference 41.5 inches    Waist to Hip Ratio 1.08 %    BMI (Calculated) 39.08    Triceps Skinfold 6 mm    % Body Fat 30.3 %    Grip Strength 50.9 kg    Flexibility 0 in    Single Leg Stand 4.73 seconds            Nutrition Therapy Plan and Nutrition Goals:  Nutrition Therapy & Goals - 05/11/20 0759      Personal Nutrition Goals   Comments Patient continues to say he is eating more salads and cold cuts eating one big meal/day and supplementing with snacks. Will continue to montior.      Intervention Plan   Intervention Nutrition handout(s) given to patient.           Nutrition Assessments:   Nutrition Goals Re-Evaluation:   Nutrition Goals Discharge (Final Nutrition  Goals Re-Evaluation):   Psychosocial: Target Goals: Acknowledge presence or absence of significant depression and/or stress, maximize coping skills, provide positive support system. Participant is able to verbalize types and ability to use techniques and skills needed for reducing stress and depression.  Initial Review & Psychosocial Screening:  Quality of Life Scores:  Quality of Life - 02/14/20 1312      Quality of Life   Select Quality of Life      Quality of Life Scores   Health/Function Pre 20.81 %    Socioeconomic Pre 20.5 %    Psych/Spiritual Pre 21 %    Family Pre 20.4 %    GLOBAL Pre 20.72 %          Scores of 19 and below usually indicate a poorer quality of life in these areas.  A difference of  2-3 points is a clinically meaningful difference.  A difference of 2-3 points in the total score of the Quality of Life Index has been associated with significant improvement in overall quality of life, self-image, physical symptoms, and general health in studies assessing change in quality of life.   PHQ-9: Recent Review Flowsheet Data    Depression screen [facility] 2/9 02/14/2020 01/12/2020 05/26/2018 04/29/2018 02/05/2018   Decreased Interest 0 0 0 0 0   Down, Depressed, Hopeless 1 0 0 0 0   PHQ - 2 Score 1 0 0 0 0   Altered sleeping 1 - - 0 -   Tired, decreased energy 1 - - - -   Change in appetite 0 - - 0 -   Feeling bad or failure about yourself  1 - - 1 -   Trouble concentrating 1 - - 0 -   Moving slowly or fidgety/restless 0 - - 0 -   Suicidal thoughts 0 - - 0 -   PHQ-9 Score 5 - - 1 -   Difficult doing work/chores Somewhat difficult - - - -     Interpretation of Total Score  Total Score Depression Severity:  1-4 = Minimal depression, 5-9 = Mild depression, 10-14 = Moderate depression, 15-19 = Moderately severe depression, 20-27 = Severe depression   Psychosocial Evaluation and Intervention:   Psychosocial Re-Evaluation:  Psychosocial Re-Evaluation    Row Name  02/24/20 1611 03/17/20 1459 04/12/20 1430 05/11/20 0804       Psychosocial Re-Evaluation   Current issues with None Identified None Identified None Identified --    Comments Patient's initial QOL score is 20.72 and his PHQ-9 score is 5 with no psychosocial issues identified. Will continue to monitor. Patient's initial QOL score is 20.72 and his PHQ-9 score is 5 with no psychosocial issues identified. Will continue to monitor. Patient's initial QOL score is 20.72 and his PHQ-9 score is 5 with no psychosocial issues identified. Will continue to monitor. Patient continues to have no psychosocial issues identified. Will continue to montior.    Expected Outcomes Patient will have no psychosocial issues identified at discharge. Patient will have no psychosocial issues identified at discharge. Patient will have no psychosocial issues identified at discharge. Patient will have no psychosocial issues identified at discharge.    Interventions Stress management education;Relaxation education;Encouraged to attend Pulmonary Rehabilitation for the exercise Stress management education;Relaxation education;Encouraged to attend Pulmonary Rehabilitation for the exercise Stress management education;Relaxation education;Encouraged to attend Pulmonary Rehabilitation for the exercise Stress management education;Relaxation education;Encouraged to attend Pulmonary Rehabilitation for the exercise    Continue Psychosocial Services  No Follow up required No Follow up required No Follow up required No Follow up required           Psychosocial Discharge (Final Psychosocial Re-Evaluation):  Psychosocial Re-Evaluation - 05/11/20 0804      Psychosocial Re-Evaluation   Comments Patient continues to have no psychosocial issues identified. Will continue  to montior.    Expected Outcomes Patient will have no psychosocial issues identified at discharge.    Interventions Stress management education;Relaxation education;Encouraged to  attend Pulmonary Rehabilitation for the exercise    Continue Psychosocial Services  No Follow up required            Education: Education Goals: Education classes will be provided on a weekly basis, covering required topics. Participant will state understanding/return demonstration of topics presented.  Learning Barriers/Preferences:   Education Topics: How Lungs Work and Diseases: - Discuss the anatomy of the lungs and diseases that can affect the lungs, such as COPD.   PULMONARY REHAB OTHER RESPIRATORY from 04/20/2020 in [place]  Date 03/12/18  Educator D. Coad  Instruction Review Code 2- Demonstrated Understanding      Exercise: -Discuss the importance of exercise, FITT principles of exercise, normal and abnormal responses to exercise, and how to exercise safely.   PULMONARY REHAB OTHER RESPIRATORY from 04/20/2020 in [place]  Date 03/05/18  Educator [place]  Instruction Review Code 2- Demonstrated Understanding      Environmental Irritants: -Discuss types of environmental irritants and how to limit exposure to environmental irritants.   Meds/Inhalers and oxygen: - Discuss respiratory medications, definition of an inhaler and oxygen, and the proper way to use an inhaler and oxygen.   PULMONARY REHAB OTHER RESPIRATORY from 04/20/2020 in [place]  Date 03/26/18  Educator [place]      Energy Saving Techniques: - Discuss methods to conserve energy and decrease shortness of breath when performing activities of daily living.    PULMONARY REHAB OTHER RESPIRATORY from 04/20/2020 in [place]  Date 04/02/18  Educator [place]  Instruction Review Code 2- Demonstrated Understanding      Bronchial Hygiene / Breathing Techniques: - Discuss breathing mechanics, pursed-lip breathing technique,  proper posture, effective ways to clear airways, and other functional breathing techniques    PULMONARY REHAB OTHER RESPIRATORY from 04/20/2020 in [place]  Date 04/09/18  Educator DC  Instruction Review Code 2- Demonstrated Understanding      Cleaning Equipment: - Provides group verbal and written instruction about the health risks of elevated stress, cause of high stress, and healthy ways to reduce stress.   PULMONARY REHAB OTHER RESPIRATORY from 04/20/2020 in [place]  Date 01/15/18  Educator [place]  Instruction Review Code 2- Demonstrated Understanding      Nutrition I: Fats: - Discuss the types of cholesterol, what cholesterol does to the body, and how cholesterol levels can be controlled.   PULMONARY REHAB OTHER RESPIRATORY from 04/20/2020 in [place]  Date 01/22/18  Educator [place]  Instruction Review Code 2- Demonstrated Understanding      Nutrition II: Labels: -Discuss the different components of food labels and how to read food labels.   PULMONARY REHAB OTHER RESPIRATORY from 04/20/2020 in [place]  Date 01/29/18  Educator DJ  Instruction Review Code 2- Demonstrated Understanding      Respiratory Infections: - Discuss the signs and symptoms of respiratory infections, ways to prevent respiratory infections, and the importance of seeking medical treatment when having a respiratory infection.   PULMONARY REHAB OTHER RESPIRATORY from 04/20/2020 in [place]  Date 02/05/18  Educator [place]  Instruction Review Code 2- Demonstrated Understanding      Stress I: Signs and Symptoms: - Discuss the causes of stress, how stress may lead to anxiety and depression, and ways  to limit stress.   Stress II: Relaxation: -Discuss relaxation techniques to limit stress.   PULMONARY REHAB OTHER RESPIRATORY from 04/20/2020 in [place]  Date 04/20/20  Educator DF  Instruction Review Code 2- Demonstrated Understanding      Oxygen for  Home/Travel: - Discuss how to prepare for travel when on oxygen and proper ways to transport and store oxygen to ensure safety.   Knowledge Questionnaire Score:   Core Components/Risk Factors/Patient Goals at Admission:   Core Components/Risk Factors/Patient Goals Review:   Goals and Risk Factor Review    Row Name 02/24/20 1610 03/17/20 1459 04/12/20 1430 05/11/20 0805       Core Components/Risk Factors/Patient Goals Review   Personal Goals Review Weight Management/Obesity;Improve shortness of breath with ADL's  Breathe better; do more activities; go on trips w/o O2. Weight Management/Obesity;Improve shortness of breath with ADL's  Breathe better; do more activities; go on trips without using oxygen. Weight Management/Obesity;Improve shortness of breath with ADL's  Breathe better; do more activities; go on trips without O2. Weight Management/Obesity;Improve shortness of breath with ADL's  Breathe better; do more activities; go on trips without oxygen.    Review Patient has completed 3 sessions maintaining his weight since he started the program. He is doing well so far and hopes to meet his goals as he continues. Will continue to monitor for progress. Patient has completed 7 sessions losing 2 lbs since last 30 day review. He continues to do well in the program with progression. He has missed some sessions recently due to low back pain per patient. He is being followed by his pcp who reports right lower quadrand abdominal pain and right loin pain. He is ordering a Renal CT for next week. Hopefully, he will be able to return to the program. Will continue to monitor for progress. Patient has completed 13 sessions gaining 2 lbs since last 30 day review. He continues to do well in the program with progression. He reports his breathing has improved. He also reports having more energy and feeling better overall. He says he is doing more around the house. He is pleaased with his progress in the program.  Will continue to monitor for progress. Patient has completed 17 sessions gaining 3 lbs since last 30 day review. He continue to do well in the program with progression. He continues to attempt to go without his oxygen more and more but his O2 sats drops at times. He says he feels his breathing continues to improve and is feeling better overall. He is doing more activities around the house. Will continue to monitor for progress.    Expected Outcomes Patient will continue to attend sessions and complete the program meeting both his personal and program goals. Patient will continue to attend sessions and complete the program meeting both his personal and program goals. Patient will continue to attend sessions and complete the program meeting both his personal and program goals. Patient will continue to attend sessions and complete the program meeting both his personal and program goals.           Core Components/Risk Factors/Patient Goals at Discharge (Final Review):   Goals and Risk Factor Review - 05/11/20 0805      Core Components/Risk Factors/Patient Goals Review   Personal Goals Review Weight Management/Obesity;Improve shortness of breath with ADL's   Breathe better; do more activities; go on trips without oxygen.   Review Patient has completed 17 sessions gaining 3 lbs since last 30 day  review. He continue to do well in the program with progression. He continues to attempt to go without his oxygen more and more but his O2 sats drops at times. He says he feels his breathing continues to improve and is feeling better overall. He is doing more activities around the house. Will continue to monitor for progress.    Expected Outcomes Patient will continue to attend sessions and complete the program meeting both his personal and program goals.           ITP Comments:   Comments: ITP REVIEW Pt is making expected progress toward pulmonary rehab goals after completing 17 sessions. Recommend continued  exercise, life style modification, education, and utilization of breathing techniques to increase stamina and strength and decrease shortness of breath with exertion.

## 2020-05-11 NOTE — Progress Notes (Signed)
Daily Session Note  Patient Details  Name: Ricardo Burch MRN: [id number] Date of Birth: 01/24/1961 Referring Provider:     PULMONARY REHAB OTHER RESP ORIENTATION from 02/14/2020 in [place] [facility]  Referring Provider Dr. Koneswaran      Encounter Date: 05/11/2020  Check In:  Session Check In - 05/11/20 1445      Check-In   Supervising physician immediately available to respond to emergencies See telemetry face sheet for immediately available MD    Location [facility]    Staff Present Vaniece Hatchett, Exercise Physiologist;Debra Johnson, RN, BSN    Virtual Visit No    Medication changes reported     No    Fall or balance concerns reported    No    Tobacco Cessation No Change    Warm-up and Cool-down Performed as group-led instruction    Resistance Training Performed Yes    VAD Patient? No    PAD/SET Patient? No      Pain Assessment   Currently in Pain? No/denies    Pain Score 0-No pain    Multiple Pain Sites No           Capillary Blood Glucose: No results found for this or any previous visit (from the past 24 hour(s)).    Social History   Tobacco Use  Smoking Status Former Smoker  . Packs/day: 1.00  . Years: 38.00  . Pack years: 38.00  . Types: Cigarettes  . Quit date: 02/21/2020  . Years since quitting: 0.2  Smokeless Tobacco Never Used    Goals Met:  Proper associated with RPD/PD & O2 Sat Independence with exercise equipment Improved SOB with ADL's Using PLB without cueing & demonstrates good technique Exercise tolerated well No report of cardiac concerns or symptoms Strength training completed today  Goals Unmet:  Not Applicable  Comments: Check out 1545.   Dr. Jehanzeb Memon is Medical Director for  Pulmonary Rehab. 

## 2020-05-15 ENCOUNTER — Other Ambulatory Visit (INDEPENDENT_AMBULATORY_CARE_PROVIDER_SITE_OTHER): Payer: Self-pay | Admitting: Internal Medicine

## 2020-05-15 MED ORDER — TESTOSTERONE CYPIONATE 200 MG/ML IM SOLN: 120.0000 mg | INTRAMUSCULAR | 1 refills | Status: DC

## 2020-05-16 ENCOUNTER — Other Ambulatory Visit: Payer: Self-pay

## 2020-05-16 ENCOUNTER — Encounter (HOSPITAL_COMMUNITY)
Admission: RE | Admit: 2020-05-16 | Discharge: 2020-05-16 | Disposition: A | Discharge status: Home / Self Care | Payer: Medicare Other | Source: Ambulatory Visit | Attending: Cardiovascular Disease | Admitting: Cardiovascular Disease

## 2020-05-16 DIAGNOSIS — I5032 Chronic diastolic (congestive) heart failure: Secondary | ICD-10-CM | POA: Diagnosis not present

## 2020-05-16 DIAGNOSIS — J449 Chronic obstructive pulmonary disease, unspecified: Secondary | ICD-10-CM

## 2020-05-16 NOTE — Progress Notes (Signed)
Daily Session Note  Patient Details  Name: Ricardo Burch MRN: [id number] Date of Birth: 1961/02/06 Referring Provider:     PULMONARY REHAB OTHER RESP ORIENTATION from 02/14/2020 in [place]  Referring Provider Dr. [name]      Encounter Date: 05/16/2020  Check In:  Session Check In - 05/16/20 1445      Check-In   Supervising physician immediately available to respond to emergencies See telemetry face sheet for immediately available MD    Location [facility]    Staff Present Geanie Cooley, RN;Vaniece Hatchett, Exercise Physiologist;Annedrea Rosezella [place], RN, [place]    Virtual Visit No    Medication changes reported     No    Fall or balance concerns reported    No    Tobacco Cessation No Change    Warm-up and Cool-down Performed as group-led instruction    Resistance Training Performed Yes    VAD Patient? No    PAD/SET Patient? No      Pain Assessment   Currently in Pain? No/denies    Pain Score 0-No pain    Multiple Pain Sites No           Capillary Blood Glucose: No results found for this or any previous visit (from the past 24 hour(s)).    Social History   Tobacco Use  Smoking Status Former Smoker  . Packs/day: 1.00  . Years: 38.00  . Pack years: 38.00  . Types: Cigarettes  . Quit date: 02/21/2020  . Years since quitting: 0.2  Smokeless Tobacco Never Used    Goals Met:  Proper associated with RPD/PD & O2 Sat Independence with exercise equipment Improved SOB with ADL's Using PLB without cueing & demonstrates good technique Exercise tolerated well Personal goals reviewed No report of cardiac concerns or symptoms Strength training completed today  Goals Unmet:  Not Applicable  Comments: check out 3:45 pm   Dr. Kathie Dike is Medical Director for [facility] Pulmonary Rehab.

## 2020-05-17 ENCOUNTER — Other Ambulatory Visit: Payer: Self-pay

## 2020-05-17 ENCOUNTER — Ambulatory Visit (INDEPENDENT_AMBULATORY_CARE_PROVIDER_SITE_OTHER): Payer: Medicare Other

## 2020-05-17 VITALS — BP 141/80 | HR 83 | Temp 97.8°F | Resp 18 | Ht 68.0 in | Wt 250.0 lb

## 2020-05-17 DIAGNOSIS — E291 Testicular hypofunction: Secondary | ICD-10-CM

## 2020-05-17 MED ORDER — TESTOSTERONE CYPIONATE 200 MG/ML IM SOLN
100.0000 mg | Freq: Once | INTRAMUSCULAR | Status: AC
  Administered 2020-05-24: 100 mg via INTRAMUSCULAR

## 2020-05-17 NOTE — Progress Notes (Signed)
Pt came into office to get the weekly injection. Inject IM of 0.28mL to the right thigh.

## 2020-05-18 ENCOUNTER — Encounter (HOSPITAL_COMMUNITY)
Admission: RE | Admit: 2020-05-18 | Discharge: 2020-05-18 | Disposition: A | Discharge status: Home / Self Care | Payer: Medicare Other | Source: Ambulatory Visit | Attending: Cardiovascular Disease | Admitting: Cardiovascular Disease

## 2020-05-18 DIAGNOSIS — I5032 Chronic diastolic (congestive) heart failure: Secondary | ICD-10-CM

## 2020-05-18 DIAGNOSIS — J449 Chronic obstructive pulmonary disease, unspecified: Secondary | ICD-10-CM

## 2020-05-18 NOTE — Progress Notes (Signed)
Daily Session Note  Patient Details  Name: Mccabe Gieselman MRN: [id number] Date of Birth: 11/01/1961 Referring Provider:     PULMONARY REHAB OTHER RESP ORIENTATION from 02/14/2020 in [place] CARDIAC REHABILITATION  Referring Provider Dr. Koneswaran      Encounter Date: 05/18/2020  Check In:  Session Check In - 05/18/20 1445      Check-In   Supervising physician immediately available to respond to emergencies See telemetry face sheet for immediately available MD    Location [facility]    Staff Present Phyllis Billingsley, RN;Vaniece Hatchett, Exercise Physiologist;Debra Johnson, RN, BSN    Virtual Visit No    Medication changes reported     No    Fall or balance concerns reported    No    Tobacco Cessation No Change    Warm-up and Cool-down Performed as group-led instruction    Resistance Training Performed Yes    VAD Patient? No    PAD/SET Patient? No      Pain Assessment   Currently in Pain? No/denies    Pain Score 0-No pain    Multiple Pain Sites No           Capillary Blood Glucose: No results found for this or any previous visit (from the past 24 hour(s)).    Social History   Tobacco Use  Smoking Status Former Smoker  . Packs/day: 1.00  . Years: 38.00  . Pack years: 38.00  . Types: Cigarettes  . Quit date: 02/21/2020  . Years since quitting: 0.2  Smokeless Tobacco Never Used    Goals Met:  Proper associated with RPD/PD & O2 Sat Independence with exercise equipment Improved SOB with ADL's Using PLB without cueing & demonstrates good technique Exercise tolerated well No report of cardiac concerns or symptoms Strength training completed today  Goals Unmet:  Not Applicable  Comments: Check out 1545   Dr. Jehanzeb Memon is Medical Director for [place] [facility]. 

## 2020-05-23 ENCOUNTER — Other Ambulatory Visit: Payer: Self-pay

## 2020-05-23 ENCOUNTER — Encounter (HOSPITAL_COMMUNITY)
Admission: RE | Admit: 2020-05-23 | Discharge: 2020-05-23 | Disposition: A | Discharge status: Home / Self Care | Payer: Medicare Other | Source: Ambulatory Visit | Attending: Cardiovascular Disease | Admitting: Cardiovascular Disease

## 2020-05-23 DIAGNOSIS — I5032 Chronic diastolic (congestive) heart failure: Secondary | ICD-10-CM

## 2020-05-23 DIAGNOSIS — J449 Chronic obstructive pulmonary disease, unspecified: Secondary | ICD-10-CM

## 2020-05-23 NOTE — Progress Notes (Signed)
Daily Session Note  Patient Details  Name: Ricardo Burch MRN: [id number] Date of Birth: 04-12-1961 Referring Provider:     PULMONARY REHAB OTHER RESP ORIENTATION from 02/14/2020 in [place]  Referring Provider Dr. [name]      Encounter Date: 05/23/2020  Check In:  Session Check In - 05/23/20 1056      Check-In   Supervising physician immediately available to respond to emergencies See telemetry face sheet for immediately available MD    Location [facility]    Staff Present Algis Downs, Exercise Physiologist;Tennille Montelongo Rosezella [place], RN, [place]    Virtual Visit No    Medication changes reported     No    Fall or balance concerns reported    No    Tobacco Cessation No Change    Warm-up and Cool-down Performed as group-led instruction    Resistance Training Performed Yes    VAD Patient? No    PAD/SET Patient? No      Pain Assessment   Currently in Pain? No/denies    Pain Score 0-No pain    Multiple Pain Sites No           Capillary Blood Glucose: No results found for this or any previous visit (from the past 24 hour(s)).    Social History   Tobacco Use  Smoking Status Former Smoker  . Packs/day: 1.00  . Years: 38.00  . Pack years: 38.00  . Types: Cigarettes  . Quit date: 02/21/2020  . Years since quitting: 0.2  Smokeless Tobacco Never Used    Goals Met:  Proper associated with RPD/PD & O2 Sat Independence with exercise equipment Improved SOB with ADL's Using PLB without cueing & demonstrates good technique No report of cardiac concerns or symptoms Strength training completed today  Goals Unmet:  Not Applicable  Comments: session 10:45-11:45   Dr. Kathie Dike is Medical Director for [facility] Pulmonary Rehab.

## 2020-05-24 ENCOUNTER — Other Ambulatory Visit: Payer: Self-pay

## 2020-05-24 ENCOUNTER — Ambulatory Visit (INDEPENDENT_AMBULATORY_CARE_PROVIDER_SITE_OTHER): Payer: Medicare Other

## 2020-05-24 VITALS — Wt 253.0 lb

## 2020-05-24 DIAGNOSIS — E291 Testicular hypofunction: Secondary | ICD-10-CM | POA: Diagnosis not present

## 2020-05-24 NOTE — Progress Notes (Signed)
Pt was given 0.44mL to the right thigh. Pt bleed Canizares amount; applied pressure & bandage.

## 2020-05-25 ENCOUNTER — Ambulatory Visit: Payer: Medicare Other | Admitting: Nurse Practitioner

## 2020-05-25 ENCOUNTER — Encounter (HOSPITAL_COMMUNITY)
Admission: RE | Admit: 2020-05-25 | Discharge: 2020-05-25 | Disposition: A | Discharge status: Home / Self Care | Payer: Medicare Other | Source: Ambulatory Visit | Attending: Cardiovascular Disease | Admitting: Cardiovascular Disease

## 2020-05-25 ENCOUNTER — Other Ambulatory Visit (INDEPENDENT_AMBULATORY_CARE_PROVIDER_SITE_OTHER): Payer: Self-pay

## 2020-05-25 DIAGNOSIS — J449 Chronic obstructive pulmonary disease, unspecified: Secondary | ICD-10-CM

## 2020-05-25 DIAGNOSIS — I5032 Chronic diastolic (congestive) heart failure: Secondary | ICD-10-CM | POA: Diagnosis not present

## 2020-05-25 MED ORDER — AMLODIPINE BESYLATE 5 MG PO TABS: 5.0000 mg | ORAL_TABLET | Freq: Every day | ORAL | 0 refills | Status: DC

## 2020-05-25 NOTE — Progress Notes (Signed)
Daily Session Note  Patient Details  Name: Ricardo Burch MRN: [id number] Date of Birth: 1961-06-01 Referring Provider:     PULMONARY REHAB OTHER RESP ORIENTATION from 02/14/2020 in [place]  Referring Provider Dr. [name]      Encounter Date: 05/25/2020  Check In:  Session Check In - 05/25/20 1054      Check-In   Supervising physician immediately available to respond to emergencies See telemetry face sheet for immediately available ER MD    [facility]    Staff Present Ramon Dredge, RN, MHA;Vaniece Hatchett, Exercise Physiologist    Virtual Visit No    Medication changes reported     No    Fall or balance concerns reported    No    Tobacco Cessation No Change    Warm-up and Cool-down Performed as group-led instruction    Resistance Training Performed Yes    VAD Patient? No    PAD/SET Patient? No      Pain Assessment   Currently in Pain? No/denies    Pain Score 0-No pain    Multiple Pain Sites No           Capillary Blood Glucose: No results found for this or any previous visit (from the past 24 hour(s)).    Social History   Tobacco Use  Smoking Status Former Smoker  . Packs/day: 1.00  . Years: 38.00  . Pack years: 38.00  . Types: Cigarettes  . Quit date: 02/21/2020  . Years since quitting: 0.2  Smokeless Tobacco Never Used    Goals Met:  Proper associated with RPD/PD & O2 Sat Independence with exercise equipment Using PLB without cueing & demonstrates good technique Exercise tolerated well No report of cardiac concerns or symptoms Strength training completed today  Goals Unmet:  Not Applicable  Comments: session [id number]   Dr. Kathie Dike is Medical Director for [facility] Pulmonary Rehab.

## 2020-05-26 ENCOUNTER — Ambulatory Visit: Payer: Medicare Other | Admitting: Gastroenterology

## 2020-05-30 ENCOUNTER — Ambulatory Visit (INDEPENDENT_AMBULATORY_CARE_PROVIDER_SITE_OTHER): Payer: Medicare Other

## 2020-05-30 ENCOUNTER — Encounter (HOSPITAL_COMMUNITY)
Admission: RE | Admit: 2020-05-30 | Discharge: 2020-05-30 | Disposition: A | Discharge status: Home / Self Care | Payer: Medicare Other | Source: Ambulatory Visit | Attending: Cardiovascular Disease | Admitting: Cardiovascular Disease

## 2020-05-30 ENCOUNTER — Other Ambulatory Visit: Payer: Self-pay

## 2020-05-30 VITALS — Wt 250.7 lb

## 2020-05-30 DIAGNOSIS — N5201 Erectile dysfunction due to arterial insufficiency: Secondary | ICD-10-CM

## 2020-05-30 DIAGNOSIS — I5032 Chronic diastolic (congestive) heart failure: Secondary | ICD-10-CM | POA: Diagnosis not present

## 2020-05-30 DIAGNOSIS — J449 Chronic obstructive pulmonary disease, unspecified: Secondary | ICD-10-CM

## 2020-05-30 DIAGNOSIS — E291 Testicular hypofunction: Secondary | ICD-10-CM

## 2020-05-30 MED ORDER — TESTOSTERONE CYPIONATE 200 MG/ML IM SOLN
120.0000 mg | Freq: Once | INTRAMUSCULAR | Status: AC
  Administered 2020-06-07: 120 mg via INTRAMUSCULAR

## 2020-05-30 NOTE — Progress Notes (Signed)
Pt given 0.1ml of last bottle of testosterone to the left thigh. Pt tolerate well. No complaints.

## 2020-05-30 NOTE — Progress Notes (Signed)
Daily Session Note  Patient Details  Name: Ricardo Burch MRN: [id number] Date of Birth: 02-26-1961 Referring Provider:     PULMONARY REHAB OTHER RESP ORIENTATION from 02/14/2020 in [place]  Referring Provider Dr. [name]      Encounter Date: 05/30/2020  Check In:  Session Check In - 05/30/20 1057      Check-In   Supervising physician immediately available to respond to emergencies See telemetry face sheet for immediately available MD    Location [facility]    Staff Present Geanie Cooley, RN;Vaniece Hatchett, Exercise Physiologist    Virtual Visit No    Medication changes reported     No    Fall or balance concerns reported    No    Tobacco Cessation No Change    Warm-up and Cool-down Performed as group-led instruction    Resistance Training Performed Yes    VAD Patient? No    PAD/SET Patient? No      Pain Assessment   Currently in Pain? No/denies    Pain Score 0-No pain    Multiple Pain Sites No           Capillary Blood Glucose: No results found for this or any previous visit (from the past 24 hour(s)).    Social History   Tobacco Use  Smoking Status Former Smoker  . Packs/day: 1.00  . Years: 38.00  . Pack years: 38.00  . Types: Cigarettes  . Quit date: 02/21/2020  . Years since quitting: 0.2  Smokeless Tobacco Never Used    Goals Met:  Proper associated with RPD/PD & O2 Sat Independence with exercise equipment Improved SOB with ADL's Using PLB without cueing & demonstrates good technique Exercise tolerated well Personal goals reviewed No report of cardiac concerns or symptoms Strength training completed today  Goals Unmet:  BP Not Applicable  Comments: check out 11:45 am   Dr. Kathie Dike is Medical Director for [facility] Pulmonary Rehab.

## 2020-05-31 ENCOUNTER — Ambulatory Visit (INDEPENDENT_AMBULATORY_CARE_PROVIDER_SITE_OTHER): Payer: Medicare Other

## 2020-06-01 ENCOUNTER — Other Ambulatory Visit: Payer: Self-pay

## 2020-06-01 ENCOUNTER — Encounter (HOSPITAL_COMMUNITY)
Admission: RE | Admit: 2020-06-01 | Discharge: 2020-06-01 | Disposition: A | Discharge status: Home / Self Care | Payer: Medicare Other | Source: Ambulatory Visit | Attending: Cardiovascular Disease | Admitting: Cardiovascular Disease

## 2020-06-01 DIAGNOSIS — J449 Chronic obstructive pulmonary disease, unspecified: Secondary | ICD-10-CM

## 2020-06-01 DIAGNOSIS — I5032 Chronic diastolic (congestive) heart failure: Secondary | ICD-10-CM

## 2020-06-01 NOTE — Progress Notes (Signed)
Daily Session Note  Patient Details  Name: Ricardo Burch MRN: [id number] Date of Birth: 04/14/1961 Referring Provider:     PULMONARY REHAB OTHER RESP ORIENTATION from 02/14/2020 in [facility]  Referring Provider Dr. Koneswaran      Encounter Date: 06/01/2020  Check In:  Session Check In - 06/01/20 1053      Check-In   Supervising physician immediately available to respond to emergencies See telemetry face sheet for immediately available MD    Staff Present Vaniece Hatchett, Exercise Physiologist;Annedrea Stackhouse, RN, MHA    Virtual Visit No    Medication changes reported     No    Fall or balance concerns reported    No    Tobacco Cessation No Change    Warm-up and Cool-down Performed as group-led instruction    Resistance Training Performed Yes    VAD Patient? No    PAD/SET Patient? No      Pain Assessment   Currently in Pain? No/denies    Pain Score 0-No pain    Multiple Pain Sites No           Capillary Blood Glucose: No results found for this or any previous visit (from the past 24 hour(s)).    Social History   Tobacco Use  Smoking Status Former Smoker  . Packs/day: 1.00  . Years: 38.00  . Pack years: 38.00  . Types: Cigarettes  . Quit date: 02/21/2020  . Years since quitting: 0.2  Smokeless Tobacco Never Used    Goals Met:  Proper associated with RPD/PD & O2 Sat Independence with exercise equipment Improved SOB with ADL's Using PLB without cueing & demonstrates good technique Exercise tolerated well No report of cardiac concerns or symptoms Strength training completed today  Goals Unmet:  Not Applicable  Comments: [id number]   Dr. Jehanzeb Memon is Medical Director for [facility]. 

## 2020-06-06 ENCOUNTER — Other Ambulatory Visit: Payer: Self-pay

## 2020-06-06 ENCOUNTER — Encounter (HOSPITAL_COMMUNITY)
Admission: RE | Admit: 2020-06-06 | Discharge: 2020-06-06 | Disposition: A | Discharge status: Home / Self Care | Payer: Medicare Other | Source: Ambulatory Visit | Attending: Cardiovascular Disease | Admitting: Cardiovascular Disease

## 2020-06-06 DIAGNOSIS — I5032 Chronic diastolic (congestive) heart failure: Secondary | ICD-10-CM | POA: Insufficient documentation

## 2020-06-06 DIAGNOSIS — J449 Chronic obstructive pulmonary disease, unspecified: Secondary | ICD-10-CM

## 2020-06-06 NOTE — Progress Notes (Signed)
Daily Session Note  Patient Details  Name: Ricardo Burch MRN: [id number] Date of Birth: June 28, 1961 Referring Provider:     PULMONARY REHAB OTHER RESP ORIENTATION from 02/14/2020 in [place]  Referring Provider Dr. [name]      Encounter Date: 06/06/2020  Check In:  Session Check In - 06/06/20 1051      Check-In   Supervising physician immediately available to respond to emergencies See telemetry face sheet for immediately available MD    Location [facility]    Staff Present Algis Downs, Exercise Physiologist;Tin Engram Rosezella [place], RN, [place]    Virtual Visit No    Medication changes reported     No    Fall or balance concerns reported    No    Tobacco Cessation No Change    Warm-up and Cool-down Performed as group-led instruction    Resistance Training Performed Yes    VAD Patient? No    PAD/SET Patient? No      Pain Assessment   Currently in Pain? No/denies    Pain Score 0-No pain    Multiple Pain Sites No           Capillary Blood Glucose: No results found for this or any previous visit (from the past 24 hour(s)).    Social History   Tobacco Use  Smoking Status Former Smoker  . Packs/day: 1.00  . Years: 38.00  . Pack years: 38.00  . Types: Cigarettes  . Quit date: 02/21/2020  . Years since quitting: 0.2  Smokeless Tobacco Never Used    Goals Met:  Proper associated with RPD/PD & O2 Sat Independence with exercise equipment Improved SOB with ADL's Using PLB without cueing & demonstrates good technique Exercise tolerated well No report of cardiac concerns or symptoms Strength training completed today  Goals Unmet:  Not Applicable  Comments: [phone]   Dr. Kathie Dike is Medical Director for [facility].

## 2020-06-07 ENCOUNTER — Ambulatory Visit (INDEPENDENT_AMBULATORY_CARE_PROVIDER_SITE_OTHER): Payer: Medicare Other

## 2020-06-07 ENCOUNTER — Other Ambulatory Visit: Payer: Self-pay

## 2020-06-07 VITALS — HR 75 | Temp 98.0°F | Resp 18 | Ht 68.0 in | Wt 253.0 lb

## 2020-06-07 DIAGNOSIS — N5201 Erectile dysfunction due to arterial insufficiency: Secondary | ICD-10-CM

## 2020-06-07 DIAGNOSIS — E291 Testicular hypofunction: Secondary | ICD-10-CM | POA: Diagnosis not present

## 2020-06-07 NOTE — Progress Notes (Signed)
Pt given 0.50mL to the right thigh muscle. Pt tolerated well. Pt stated weight difference at cardiac rehab clinic was 257 lb. But today it was 253 lb in office. Will sent not to provider, set a f/u appointment.

## 2020-06-08 ENCOUNTER — Encounter (HOSPITAL_COMMUNITY)
Admission: RE | Admit: 2020-06-08 | Discharge: 2020-06-08 | Disposition: A | Discharge status: Home / Self Care | Payer: Medicare Other | Source: Ambulatory Visit | Attending: Cardiovascular Disease | Admitting: Cardiovascular Disease

## 2020-06-08 VITALS — Wt 253.0 lb

## 2020-06-08 DIAGNOSIS — I5032 Chronic diastolic (congestive) heart failure: Secondary | ICD-10-CM

## 2020-06-08 DIAGNOSIS — J449 Chronic obstructive pulmonary disease, unspecified: Secondary | ICD-10-CM

## 2020-06-08 NOTE — Progress Notes (Signed)
Pulmonary Individual Treatment Plan  Patient Details  Name: Ricardo Burch MRN: [id number] Date of Birth: 01-29-1961 Referring Provider:     PULMONARY REHAB OTHER RESP ORIENTATION from 02/14/2020 in [place]  Referring Provider Dr. [name]      Initial Encounter Date:    [place] from 02/14/2020 in [place]  Date 02/14/20      Visit Diagnosis: Chronic obstructive pulmonary disease, unspecified COPD type ([place])  Chronic diastolic CHF (congestive heart failure) ([place])  Patient's Home Medications on Admission:   Current Outpatient Medications:  .  albuterol (VENTOLIN HFA) 108 (90 Base) MCG/ACT inhaler, Inhale 1 or 2 puffs by mouth every 6 hours as needed for wheezing or shortness of breath, Disp: 18 g, Rfl: 4 .  amLODipine (NORVASC) 5 MG tablet, Take 1 tablet (5 mg total) by mouth daily., Disp: 90 tablet, Rfl: 0 .  BREZTRI AEROSPHERE 160-9-4.8 MCG/ACT AERO, INHALE 2 PUFFS BY MOUTH TWICE DAILY, Disp: 10.7 g, Rfl: 5 .  Cholecalciferol (VITAMIN D-3) 125 MCG (5000 UT) TABS, Take 2 tablets by mouth daily., Disp: , Rfl:  .  fluticasone (FLONASE) 50 MCG/ACT nasal spray, INSTILL 2 SPRAYS IN EACH NOSTRIL TWICE DAILY, Disp: 16 mL, Rfl: 5 .  ipratropium-albuterol (DUONEB) 0.5-2.5 (3) MG/3ML SOLN, Take 3 mLs by nebulization every 4 (four) hours as needed., Disp: 360 mL, Rfl: 11 .  meloxicam (MOBIC) 7.5 MG tablet, Take 1 tablet (7.5 mg total) by mouth daily., Disp: 30 tablet, Rfl: 5 .  OXYGEN, Inhale 3 L into the lungs daily. continuous, Disp: , Rfl:  .  pantoprazole (PROTONIX) 40 MG tablet, Take 1 tablet (40 mg total) by mouth daily., Disp: 90 tablet, Rfl: 3 .  roflumilast (DALIRESP) 500 MCG TABS tablet, Take 1 tablet (500 mcg total) by mouth daily., Disp: 30 tablet, Rfl: 5 .  tadalafil (CIALIS) 20 MG tablet, Take 0.5 tablets (10 mg total) by mouth daily as needed for erectile dysfunction., Disp: 10 tablet, Rfl: 2 .   testosterone cypionate (DEPOTESTOSTERONE CYPIONATE) 200 MG/ML injection, Inject 0.6 mLs (120 mg total) into the muscle once a week. 0.10ml/week, Disp: 10 mL, Rfl: 1  Past Medical History: Past Medical History:  Diagnosis Date  . Allergy   . CHF (congestive heart failure) ([place])   . Chronic kidney disease   . COPD (chronic obstructive pulmonary disease) ([place]) Dx 2015  . Depression   . Eczema    since childhood   . Erectile dysfunction 08/18/2019  . GERD (gastroesophageal reflux disease) 01/25/2020  . Hyperlipidemia 01/12/2018  . Hypertension   . Oxygen deficiency   . Sleep apnea    CPAP  . Substance abuse ([place])    MJ, cocaine  . Testicular failure 07/22/2019    Tobacco Use: Social History   Tobacco Use  Smoking Status Former Smoker  . Packs/day: 1.00  . Years: 38.00  . Pack years: 38.00  . Types: Cigarettes  . Quit date: 02/21/2020  . Years since quitting: 0.2  Smokeless Tobacco Never Used    Labs: Recent Review Flowsheet Data    Labs for ITP Cardiac and Pulmonary Rehab Latest Ref Rng & Units 07/27/2016 10/14/2017 09/23/2018 09/24/2018 07/22/2019   Cholestrol <200 mg/dL - 136 - 91 122   LDLCALC mg/dL (calc) - 60 - 49 68   HDL > OR = 40 mg/dL - 60 - 33(L) 40   Trlycerides <150 mg/dL - 82 - 43 67   Hemoglobin A1c 4.8 - 5.6 % -  5.3 5.5 - -   PHART 7.35 - 7.45 7.341(L) - - - -   PCO2ART 32 - 48 mmHg 84.2(HH) - - - -   HCO3 20.0 - 28.0 mmol/L 44.3(H) - - - -   TCO2 0 - 100 mmol/L - - - - -   O2SAT % 92.0 - - - -      Capillary Blood Glucose: Lab Results  Component Value Date   GLUCAP 84 11/20/2019   GLUCAP 93 05/12/2018   GLUCAP 179 (H) 07/27/2016   GLUCAP 156 (H) 07/27/2016   GLUCAP 140 (H) 09/16/2015     Pulmonary Assessment Scores:  Pulmonary Assessment Scores    Row Name 02/14/20 1338         ADL UCSD   ADL Phase Entry     SOB Score total 57     Rest 0     Walk 7     Stairs 4     Bath 3     Dress 2     Shop 3       CAT Score   CAT Score 17         mMRC Score   mMRC Score 4           UCSD: Self-administered rating of dyspnea associated with activities of daily living (ADLs) 6-point scale (0 = "not at all" to 5 = "maximal or unable to do because of breathlessness")  Scoring Scores range from 0 to 120.  Minimally important difference is 5 units  CAT: CAT can identify the health impairment of COPD patients and is better correlated with disease progression.  CAT has a scoring range of zero to 40. The CAT score is classified into four groups of low (less than 10), medium (10 - 20), high (21-30) and very high (31-40) based on the impact level of disease on health status. A CAT score over 10 suggests significant symptoms.  A worsening CAT score could be explained by an exacerbation, poor medication adherence, poor inhaler technique, or progression of COPD or comorbid conditions.  CAT MCID is 2 points  mMRC: mMRC (Modified Medical Research Council) Dyspnea Scale is used to assess the degree of baseline functional disability in patients of respiratory disease due to dyspnea. No minimal important difference is established. A decrease in score of 1 point or greater is considered a positive change.   Pulmonary Function Assessment:   Exercise Target Goals: Exercise Program Goal: Individual exercise prescription set using results from initial 6 min walk test and THRR while considering  patient's activity barriers and safety.   Exercise Prescription Goal: Initial exercise prescription builds to 30-45 minutes a day of aerobic activity, 2-3 days per week.  Home exercise guidelines will be given to patient during program as part of exercise prescription that the participant will acknowledge.  Activity Barriers & Risk Stratification:  Activity Barriers & Cardiac Risk Stratification - 02/14/20 1251      Activity Barriers & Cardiac Risk Stratification   Activity Barriers Deconditioning;Shortness of Breath    Cardiac Risk Stratification  Moderate           6 Minute Walk:  6 Minute Walk    Row Name 02/14/20 1247         6 Minute Walk   Phase Initial     Distance 800 feet     Walk Time 6 minutes     # of Rest Breaks 0     MPH 1.51  METS 2.16     RPE 12     Perceived Dyspnea  14     VO2 Peak 7.83     Symptoms Yes (comment)     Comments SOB     Resting HR 83 bpm     Resting BP 130/90     Resting Oxygen Saturation  96 %     Exercise Oxygen Saturation  during 6 min walk 89 %     Max Ex. HR 99 bpm     Max Ex. BP 148/92     2 Minute Post BP 120/88            Oxygen Initial Assessment:  Oxygen Initial Assessment - 02/14/20 1336      Home Oxygen   Home Oxygen Device Home Concentrator    Sleep Oxygen Prescription CPAP    Liters per minute 3    Home Exercise Oxygen Prescription Continuous    Liters per minute 2    Home at Rest Exercise Oxygen Prescription Continuous    Liters per minute 3    Compliance with Home Oxygen Use Yes      Initial 6 min Walk   Oxygen Used Continuous    Liters per minute 4      Program Oxygen Prescription   Program Oxygen Prescription Continuous    Liters per minute 4      Intervention   Short Term Goals To learn and exhibit compliance with exercise, home and travel O2 prescription;To learn and understand importance of monitoring SPO2 with pulse oximeter and demonstrate accurate use of the pulse oximeter.;To learn and understand importance of maintaining oxygen saturations>88%    Long  Term Goals Exhibits compliance with exercise, home and travel O2 prescription;Verbalizes importance of monitoring SPO2 with pulse oximeter and return demonstration;Maintenance of O2 saturations>88%;Exhibits proper breathing techniques, such as pursed lip breathing or other method taught during program session           Oxygen Re-Evaluation:  Oxygen Re-Evaluation    Row Name 02/24/20 1556 03/17/20 1458 04/12/20 1428 05/11/20 0758 06/07/20 1447     Program Oxygen Prescription    Program Oxygen Prescription None Continuous Continuous Continuous Continuous   Liters per minute 4 4 4 4 4    FiO2% 93 93 94 93 93     Home Oxygen   Home Oxygen Device Home Concentrator Home Concentrator Home Concentrator Home Concentrator Home Concentrator   Sleep Oxygen Prescription CPAP CPAP CPAP CPAP CPAP   Liters per minute 3 3 2 2 3    Home Exercise Oxygen Prescription Continuous Continuous None Continuous Continuous   Liters per minute 3 3 3 3 3    Home at Rest Exercise Oxygen Prescription Continuous None None None None   Liters per minute 3 3 -- -- --   Compliance with Home Oxygen Use Yes Yes Yes Yes Yes     Goals/Expected Outcomes   Short Term Goals To learn and exhibit compliance with exercise, home and travel O2 prescription;To learn and understand importance of monitoring SPO2 with pulse oximeter and demonstrate accurate use of the pulse oximeter.;To learn and understand importance of maintaining oxygen saturations>88%;To learn and demonstrate proper pursed lip breathing techniques or other breathing techniques. To learn and exhibit compliance with exercise, home and travel O2 prescription;To learn and understand importance of monitoring SPO2 with pulse oximeter and demonstrate accurate use of the pulse oximeter.;To learn and understand importance of maintaining oxygen saturations>88%;To learn and demonstrate proper pursed lip breathing techniques or other breathing techniques.  To learn and exhibit compliance with exercise, home and travel O2 prescription;To learn and understand importance of monitoring SPO2 with pulse oximeter and demonstrate accurate use of the pulse oximeter.;To learn and understand importance of maintaining oxygen saturations>88%;To learn and demonstrate proper pursed lip breathing techniques or other breathing techniques. To learn and exhibit compliance with exercise, home and travel O2 prescription;To learn and understand importance of monitoring SPO2 with pulse  oximeter and demonstrate accurate use of the pulse oximeter.;To learn and understand importance of maintaining oxygen saturations>88%;To learn and demonstrate proper pursed lip breathing techniques or other breathing techniques. To learn and exhibit compliance with exercise, home and travel O2 prescription;To learn and understand importance of monitoring SPO2 with pulse oximeter and demonstrate accurate use of the pulse oximeter.;To learn and understand importance of maintaining oxygen saturations>88%;To learn and demonstrate proper pursed lip breathing techniques or other breathing techniques.   Long  Term Goals Exhibits compliance with exercise, home and travel O2 prescription;Verbalizes importance of monitoring SPO2 with pulse oximeter and return demonstration;Maintenance of O2 saturations>88%;Exhibits proper breathing techniques, such as pursed lip breathing or other method taught during program session;Compliance with respiratory medication Exhibits compliance with exercise, home and travel O2 prescription;Verbalizes importance of monitoring SPO2 with pulse oximeter and return demonstration;Maintenance of O2 saturations>88%;Exhibits proper breathing techniques, such as pursed lip breathing or other method taught during program session;Compliance with respiratory medication Exhibits compliance with exercise, home and travel O2 prescription;Verbalizes importance of monitoring SPO2 with pulse oximeter and return demonstration;Maintenance of O2 saturations>88%;Exhibits proper breathing techniques, such as pursed lip breathing or other method taught during program session;Compliance with respiratory medication Exhibits compliance with exercise, home and travel O2 prescription;Verbalizes importance of monitoring SPO2 with pulse oximeter and return demonstration;Maintenance of O2 saturations>88%;Exhibits proper breathing techniques, such as pursed lip breathing or other method taught during program  session;Compliance with respiratory medication Exhibits compliance with exercise, home and travel O2 prescription;Verbalizes importance of monitoring SPO2 with pulse oximeter and return demonstration;Maintenance of O2 saturations>88%;Exhibits proper breathing techniques, such as pursed lip breathing or other method taught during program session;Compliance with respiratory medication   Comments Patient demonstrates proper pursed lip breathing techniques during exercise and also demonstrates proper usage of pulse oximeter. He is able to verbalize the importance of maintaining his O2 saturations >88%. He also says he is complaint with all his medication and his home exercise prescription. Will continue to monitor. Patient demonstrates proper pursed lip breathing techniques during exercise and also demonstrates proper usage of pulse oximeter. He is able to verbalize the importance of maintaining his O2 saturations >88%. He also says he is complaint with all his medication and his home exercise prescription. Will continue to monitor. Patient demonstrates proper pursed lip breathing techniques during exercise and also demonstrates proper usage of pulse oximeter. He is able to verbalize the importance of maintaining his O2 saturations >88%. He also says he is complaint with all his medication and his home exercise prescription. Will continue to monitor. Patient demonstrates proper pursed lip breathing techniques during exercise and also demonstrates proper usage of pulse oximeter. He is able to verbalize the importance of maintaining his O2 saturations >88%. He also says he is complaint with all his medication and his home exercise prescription. Will continue to monitor. Patient demonstrates proper pursed lip breathing techniques during exercise and also demonstrates proper usage of pulse oximeter. He is able to verbalize the importance of maintaining his O2 saturations >88%. He also says he is complaint with all his  medication and his  home exercise prescription. Will continue to monitor.   Goals/Expected Outcomes Patient will continue to meet both his short and long term goals.  Patient will continue to meet both his short and long term goals.  Patient will continue to meet both his short and long term goals.  Patient will continue to meet both his short and long term goals.  Patient will continue to meet both his short and long term goals.           Oxygen Discharge (Final Oxygen Re-Evaluation):  Oxygen Re-Evaluation - 06/07/20 1447      Program Oxygen Prescription   Program Oxygen Prescription Continuous    Liters per minute 4    FiO2% 93      Home Oxygen   Home Oxygen Device Home Concentrator    Sleep Oxygen Prescription CPAP    Liters per minute 3    Home Exercise Oxygen Prescription Continuous    Liters per minute 3    Home at Rest Exercise Oxygen Prescription None    Compliance with Home Oxygen Use Yes      Goals/Expected Outcomes   Short Term Goals To learn and exhibit compliance with exercise, home and travel O2 prescription;To learn and understand importance of monitoring SPO2 with pulse oximeter and demonstrate accurate use of the pulse oximeter.;To learn and understand importance of maintaining oxygen saturations>88%;To learn and demonstrate proper pursed lip breathing techniques or other breathing techniques.    Long  Term Goals Exhibits compliance with exercise, home and travel O2 prescription;Verbalizes importance of monitoring SPO2 with pulse oximeter and return demonstration;Maintenance of O2 saturations>88%;Exhibits proper breathing techniques, such as pursed lip breathing or other method taught during program session;Compliance with respiratory medication    Comments Patient demonstrates proper pursed lip breathing techniques during exercise and also demonstrates proper usage of pulse oximeter. He is able to verbalize the importance of maintaining his O2 saturations >88%. He also  says he is complaint with all his medication and his home exercise prescription. Will continue to monitor.    Goals/Expected Outcomes Patient will continue to meet both his short and long term goals.            Initial Exercise Prescription:  Initial Exercise Prescription - 02/14/20 1200      Date of Initial Exercise RX and Referring Provider   Date 02/14/20    Referring Provider Dr. [name]    Expected Discharge Date 05/15/20      Oxygen   Oxygen Continuous    Liters 3      Treadmill   MPH 1.2    Grade 0    Minutes 17    METs 1.91      Recumbant Elliptical   Level 1    RPM 33    Watts 30    Minutes 22    METs 1.6      Prescription Details   Frequency (times per week) 2    Duration Progress to 30 minutes of continuous aerobic without signs/symptoms of physical distress      Intensity   THRR 40-80% of Max Heartrate [phone]    Ratings of Perceived Exertion 11-13    Perceived Dyspnea 0-4      Progression   Progression Continue to progress workloads to maintain intensity without signs/symptoms of physical distress.      Resistance Training   Training Prescription Yes    Weight 1    Reps 10-15           Perform Capillary  Blood Glucose checks as needed.  Exercise Prescription Changes:   Exercise Prescription Changes    Row Name 03/20/20 1500 04/03/20 1400 04/17/20 1500 04/20/20 1319 05/11/20 1132     Response to Exercise   Blood Pressure (Admit) 140/84 120/84 122/62 126/74 140/90   Blood Pressure (Exercise) 142/106 152/98 150/100 162/70 152/80   Blood Pressure (Exit) 122/84 134/78 120/86 136/80 132/78   Heart Rate (Admit) 91 bpm 89 bpm 81 bpm 77 bpm 75 bpm   Heart Rate (Exercise) 115 bpm 104 bpm 101 bpm 101 bpm 118 bpm   Heart Rate (Exit) 97 bpm 97 bpm 89 bpm 95 bpm 106 bpm   Oxygen Saturation (Admit) 95 % 93 % 93 % 94 % 94 %   Oxygen Saturation (Exercise) 91 % 93 % 92 % 93 % 90 %   Oxygen Saturation (Exit) 94 % 96 % 95 % 94 % 95 %   Rating of  Perceived Exertion (Exercise) 11 11 12 11 11    Perceived Dyspnea (Exercise) 11 11 12 12 11    Symptoms -- None -- -- --   Duration Continue with 30 min of aerobic exercise without signs/symptoms of physical distress. Continue with 30 min of aerobic exercise without signs/symptoms of physical distress. Continue with 30 min of aerobic exercise without signs/symptoms of physical distress. Continue with 30 min of aerobic exercise without signs/symptoms of physical distress. Continue with 30 min of aerobic exercise without signs/symptoms of physical distress.   Intensity -- THRR New THRR unchanged THRR unchanged THRR unchanged     Progression   Progression Continue to progress workloads to maintain intensity without signs/symptoms of physical distress. Continue to progress workloads to maintain intensity without signs/symptoms of physical distress. Continue to progress workloads to maintain intensity without signs/symptoms of physical distress. Continue to progress workloads to maintain intensity without signs/symptoms of physical distress. Continue to progress workloads to maintain intensity without signs/symptoms of physical distress.   Average METs 1.9 2.82 -- -- --     Resistance Training   Training Prescription Yes Yes Yes Yes Yes   Weight 2 3 3 3 3    Reps 10-15 10-15 10-15 10-15 10-15     Oxygen   Oxygen Continuous Continuous Continuous Continuous Continuous   Liters 3 3 3 3 3      Treadmill   MPH 1.5 1.6 1.6 1.6 1.8   Grade 0 0 0 0 0   Minutes 17 17 17 17 17    METs 2.14 2.22 2.22 2.22 2.37     Recumbant Elliptical   Level 1 1 1 1 1    RPM 41 51 47 51 54   Watts 43 64 53 55 59   Minutes 22 22 22 22 22    METs 2.4 3.5 3 2.6 3.1     Home Exercise Plan   Plans to continue exercise at Home (comment) Home (comment) -- -- --   Frequency Add 3 additional days to program exercise sessions. Add 3 additional days to program exercise sessions. -- -- --   Initial Home Exercises Provided 02/14/20  02/14/20 -- -- --   [place] Name 05/30/20 1200             Response to Exercise   Blood Pressure (Admit) 120/70       Blood Pressure (Exercise) 130/72       Blood Pressure (Exit) 140/88       Heart Rate (Admit) 95 bpm       Heart Rate (Exercise) 107 bpm  Heart Rate (Exit) 101 bpm       Oxygen Saturation (Admit) 95 %       Oxygen Saturation (Exercise) 93 %       Oxygen Saturation (Exit) 95 %       Rating of Perceived Exertion (Exercise) 11       Perceived Dyspnea (Exercise) 11       Duration Continue with 30 min of aerobic exercise without signs/symptoms of physical distress.       Intensity THRR unchanged         Progression   Progression Continue to progress workloads to maintain intensity without signs/symptoms of physical distress.         Resistance Training   Training Prescription Yes       Weight 3       Reps 10-15         Oxygen   Oxygen Continuous       Liters 3         Treadmill   MPH 1.9       Grade 0       Minutes 17       METs 2.45         Recumbant Elliptical   Level 2       RPM 54       Watts 70       Minutes 22       METs 3.5              Exercise Comments:   Exercise Comments    Row Name 02/14/20 1306 02/24/20 1421 03/20/20 1546 04/03/20 1429     Exercise Comments This is the patients assessment/orientation visit. The patient has been in the program before and did well. He is eagar to get started This is patients 2nd visit. He is doing well so far. We will continue to progress as tolerated. Patient is attending regularly. Patient stated that he is using less oxygen when he travels around town. He is doing well in the program and we will continue to progress as tolerated. Patients attendance is very consistant. He is doing well in the program. We will continue to progress as tolerated.           Exercise Goals and Review:   Exercise Goals    Row Name 02/14/20 1302 06/07/20 1401           Exercise Goals   Increase Physical Activity  Yes Yes      Intervention Provide advice, education, support and counseling about physical activity/exercise needs.;Develop an individualized exercise prescription for aerobic and resistive training based on initial evaluation findings, risk stratification, comorbidities and participant's personal goals. Provide advice, education, support and counseling about physical activity/exercise needs.;Develop an individualized exercise prescription for aerobic and resistive training based on initial evaluation findings, risk stratification, comorbidities and participant's personal goals.      Expected Outcomes Short Term: Attend rehab on a regular basis to increase amount of physical activity.;Long Term: Add in home exercise to make exercise part of routine and to increase amount of physical activity. Short Term: Attend rehab on a regular basis to increase amount of physical activity.;Long Term: Add in home exercise to make exercise part of routine and to increase amount of physical activity.;Long Term: Exercising regularly at least 3-5 days a week.      Increase Strength and Stamina Yes Yes      Intervention Provide advice, education, support and counseling about physical activity/exercise needs.;Develop an  individualized exercise prescription for aerobic and resistive training based on initial evaluation findings, risk stratification, comorbidities and participant's personal goals. Provide advice, education, support and counseling about physical activity/exercise needs.;Develop an individualized exercise prescription for aerobic and resistive training based on initial evaluation findings, risk stratification, comorbidities and participant's personal goals.      Expected Outcomes Short Term: Perform resistance training exercises routinely during rehab and add in resistance training at home;Long Term: Improve cardiorespiratory fitness, muscular endurance and strength as measured by increased METs and functional capacity  (6MWT) Short Term: Increase workloads from initial exercise prescription for resistance, speed, and METs.;Short Term: Perform resistance training exercises routinely during rehab and add in resistance training at home;Long Term: Improve cardiorespiratory fitness, muscular endurance and strength as measured by increased METs and functional capacity (6MWT)      Able to understand and use rate of perceived exertion (RPE) scale Yes Yes      Intervention Provide education and explanation on how to use RPE scale Provide education and explanation on how to use RPE scale      Expected Outcomes Short Term: Able to use RPE daily in rehab to express subjective intensity level;Long Term:  Able to use RPE to guide intensity level when exercising independently Short Term: Able to use RPE daily in rehab to express subjective intensity level;Long Term:  Able to use RPE to guide intensity level when exercising independently      Able to understand and use Dyspnea scale Yes Yes      Intervention Provide education and explanation on how to use Dyspnea scale Provide education and explanation on how to use Dyspnea scale      Expected Outcomes Short Term: Able to use Dyspnea scale daily in rehab to express subjective sense of shortness of breath during exertion;Long Term: Able to use Dyspnea scale to guide intensity level when exercising independently Short Term: Able to use Dyspnea scale daily in rehab to express subjective sense of shortness of breath during exertion;Long Term: Able to use Dyspnea scale to guide intensity level when exercising independently      Knowledge and understanding of Target Heart Rate Range (THRR) Yes --      Intervention Provide education and explanation of THRR including how the numbers were predicted and where they are located for reference Provide education and explanation of THRR including how the numbers were predicted and where they are located for reference      Expected Outcomes Short Term:  Able to use daily as guideline for intensity in rehab;Long Term: Able to use THRR to govern intensity when exercising independently Short Term: Able to state/look up THRR;Long Term: Able to use THRR to govern intensity when exercising independently      Able to check pulse independently Yes --      Intervention Provide education and demonstration on how to check pulse in carotid and radial arteries.;Review the importance of being able to check your own pulse for safety during independent exercise --      Expected Outcomes Short Term: Able to explain why pulse checking is important during independent exercise;Long Term: Able to check pulse independently and accurately --      Understanding of Exercise Prescription Yes Yes      Intervention Provide education, explanation, and written materials on patient's individual exercise prescription Provide education, explanation, and written materials on patient's individual exercise prescription      Expected Outcomes Short Term: Able to explain program exercise prescription;Long Term: Able to explain home  exercise prescription to exercise independently Short Term: Able to explain program exercise prescription;Long Term: Able to explain home exercise prescription to exercise independently             Exercise Goals Re-Evaluation :  Exercise Goals Re-Evaluation    Row Name 03/20/20 1544 04/03/20 1426 05/05/20 1553 06/07/20 1402       Exercise Goal Re-Evaluation   Exercise Goals Review Increase Physical Activity;Increase Strength and Stamina Increase Physical Activity;Increase Strength and Stamina Increase Physical Activity;Increase Strength and Stamina Increase Physical Activity;Increase Strength and Stamina;Able to understand and use rate of perceived exertion (RPE) scale;Able to understand and use Dyspnea scale;Knowledge and understanding of Target Heart Rate Range (THRR);Understanding of Exercise Prescription    Comments Patient wants to breathe better and  to be able to go on trips without oxygen. Ricardo Burch's goals are to breathe better and to be able to go on trips w/o oxygen. Ricardo Burch is doing really well. He is able to tolerate his progressions w/o any any abnormal S/S. He is still not able to go without his oxygen for long periods. He workloads are increasing with each session. We will continue to monitor his progress. He is breathing alot better. He is progressing at a steady pace. He shows great effort toward reducing his use of continuous oxgen. We will continue to progress as tolerated Patient said that he is breathing alot better. He is not using as much oxygen as before. He only uses it when he is going on long trips or doing vigorous activity. He is currently motivated to lose weight and change his diet. He is tolerating increased workloads well. We will continue to progress as tolerated.    Expected Outcomes To reach expected goals To reach his expected goals. To reach expected goals To reach expected goals           Discharge Exercise Prescription (Final Exercise Prescription Changes):  Exercise Prescription Changes - 05/30/20 1200      Response to Exercise   Blood Pressure (Admit) 120/70    Blood Pressure (Exercise) 130/72    Blood Pressure (Exit) 140/88    Heart Rate (Admit) 95 bpm    Heart Rate (Exercise) 107 bpm    Heart Rate (Exit) 101 bpm    Oxygen Saturation (Admit) 95 %    Oxygen Saturation (Exercise) 93 %    Oxygen Saturation (Exit) 95 %    Rating of Perceived Exertion (Exercise) 11    Perceived Dyspnea (Exercise) 11    Duration Continue with 30 min of aerobic exercise without signs/symptoms of physical distress.    Intensity THRR unchanged      Progression   Progression Continue to progress workloads to maintain intensity without signs/symptoms of physical distress.      Resistance Training   Training Prescription Yes    Weight 3    Reps 10-15      Oxygen   Oxygen Continuous    Liters 3      Treadmill   MPH 1.9     Grade 0    Minutes 17    METs 2.45      Recumbant Elliptical   Level 2    RPM 54    Watts 70    Minutes 22    METs 3.5           Nutrition:  Target Goals: Understanding of nutrition guidelines, daily intake of sodium 1500mg , cholesterol 200mg , calories 30% from fat and 7% or less from saturated fats, daily to  have 5 or more servings of fruits and vegetables.  Biometrics:  Pre Biometrics - 02/14/20 1310      Pre Biometrics   Height 5\' 7"  (1.702 m)    Weight 113.2 kg    Waist Circumference 45 inches    Hip Circumference 41.5 inches    Waist to Hip Ratio 1.08 %    BMI (Calculated) 39.08    Triceps Skinfold 6 mm    % Body Fat 30.3 %    Grip Strength 50.9 kg    Flexibility 0 in    Single Leg Stand 4.73 seconds            Nutrition Therapy Plan and Nutrition Goals:  Nutrition Therapy & Goals - 06/07/20 1448      Personal Nutrition Goals   Comments Patient continues to say he is eating more salads and cold cuts eating one big meal/day and supplementing with snacks. Will continue to montior.      Intervention Plan   Intervention Nutrition handout(s) given to patient.    Expected Outcomes Short Term Goal: A plan has been developed with personal nutrition goals set during dietitian appointment.;Long Term Goal: Adherence to prescribed nutrition plan.           Nutrition Assessments:   Nutrition Goals Re-Evaluation:   Nutrition Goals Discharge (Final Nutrition Goals Re-Evaluation):   Psychosocial: Target Goals: Acknowledge presence or absence of significant depression and/or stress, maximize coping skills, provide positive support system. Participant is able to verbalize types and ability to use techniques and skills needed for reducing stress and depression.  Initial Review & Psychosocial Screening:   Quality of Life Scores:  Quality of Life - 02/14/20 1312      Quality of Life   Select Quality of Life      Quality of Life Scores   Health/Function Pre  20.81 %    Socioeconomic Pre 20.5 %    Psych/Spiritual Pre 21 %    Family Pre 20.4 %    GLOBAL Pre 20.72 %          Scores of 19 and below usually indicate a poorer quality of life in these areas.  A difference of  2-3 points is a clinically meaningful difference.  A difference of 2-3 points in the total score of the Quality of Life Index has been associated with significant improvement in overall quality of life, self-image, physical symptoms, and general health in studies assessing change in quality of life.   PHQ-9: Recent Review Flowsheet Data    Depression screen [facility] 2/9 02/14/2020 01/12/2020 05/26/2018 04/29/2018 02/05/2018   Decreased Interest 0 0 0 0 0   Down, Depressed, Hopeless 1 0 0 0 0   PHQ - 2 Score 1 0 0 0 0   Altered sleeping 1 - - 0 -   Tired, decreased energy 1 - - - -   Change in appetite 0 - - 0 -   Feeling bad or failure about yourself  1 - - 1 -   Trouble concentrating 1 - - 0 -   Moving slowly or fidgety/restless 0 - - 0 -   Suicidal thoughts 0 - - 0 -   PHQ-9 Score 5 - - 1 -   Difficult doing work/chores Somewhat difficult - - - -     Interpretation of Total Score  Total Score Depression Severity:  1-4 = Minimal depression, 5-9 = Mild depression, 10-14 = Moderate depression, 15-19 = Moderately severe depression, 20-27 = Severe  depression   Psychosocial Evaluation and Intervention:   Psychosocial Re-Evaluation:  Psychosocial Re-Evaluation    Row Name 02/24/20 1611 03/17/20 1459 04/12/20 1430 05/11/20 0804 06/07/20 1452     Psychosocial Re-Evaluation   Current issues with None Identified None Identified None Identified -- None Identified   Comments Patient's initial QOL score is 20.72 and his PHQ-9 score is 5 with no psychosocial issues identified. Will continue to monitor. Patient's initial QOL score is 20.72 and his PHQ-9 score is 5 with no psychosocial issues identified. Will continue to monitor. Patient's initial QOL score is 20.72 and his PHQ-9 score is  5 with no psychosocial issues identified. Will continue to monitor. Patient continues to have no psychosocial issues identified. Will continue to montior. Patient continues to have no psychosocial issues identified. Will continue to montior.   Expected Outcomes Patient will have no psychosocial issues identified at discharge. Patient will have no psychosocial issues identified at discharge. Patient will have no psychosocial issues identified at discharge. Patient will have no psychosocial issues identified at discharge. Patient will have no psychosocial issues identified at discharge.   Interventions Stress management education;Relaxation education;Encouraged to attend Pulmonary Rehabilitation for the exercise Stress management education;Relaxation education;Encouraged to attend Pulmonary Rehabilitation for the exercise Stress management education;Relaxation education;Encouraged to attend Pulmonary Rehabilitation for the exercise Stress management education;Relaxation education;Encouraged to attend Pulmonary Rehabilitation for the exercise Stress management education;Relaxation education;Encouraged to attend Pulmonary Rehabilitation for the exercise   Continue Psychosocial Services  No Follow up required No Follow up required No Follow up required No Follow up required No Follow up required          Psychosocial Discharge (Final Psychosocial Re-Evaluation):  Psychosocial Re-Evaluation - 06/07/20 1452      Psychosocial Re-Evaluation   Current issues with None Identified    Comments Patient continues to have no psychosocial issues identified. Will continue to montior.    Expected Outcomes Patient will have no psychosocial issues identified at discharge.    Interventions Stress management education;Relaxation education;Encouraged to attend Pulmonary Rehabilitation for the exercise    Continue Psychosocial Services  No Follow up required            Education: Education Goals: Education classes  will be provided on a weekly basis, covering required topics. Participant will state understanding/return demonstration of topics presented.  Learning Barriers/Preferences:   Education Topics: How Lungs Work and Diseases: - Discuss the anatomy of the lungs and diseases that can affect the lungs, such as COPD.   PULMONARY REHAB OTHER RESPIRATORY from 05/18/2020 in [place]  Date 03/12/18  Educator D. Coad  Instruction Review Code 2- Demonstrated Understanding      Exercise: -Discuss the importance of exercise, FITT principles of exercise, normal and abnormal responses to exercise, and how to exercise safely.   PULMONARY REHAB OTHER RESPIRATORY from 05/18/2020 in [place]  Date 03/05/18  Educator [place]  Instruction Review Code 2- Demonstrated Understanding      Environmental Irritants: -Discuss types of environmental irritants and how to limit exposure to environmental irritants.   PULMONARY REHAB OTHER RESPIRATORY from 05/18/2020 in [place]  Date 05/18/20  Educator Etheleen Mayhew  Instruction Review Code 1- Verbalizes Understanding      Meds/Inhalers and oxygen: - Discuss respiratory medications, definition of an inhaler and oxygen, and the proper way to use an inhaler and oxygen.   PULMONARY REHAB OTHER RESPIRATORY from 05/18/2020 in [place]  Date 03/26/18  Educator [place]  Energy Saving Techniques: - Discuss methods to conserve energy and decrease shortness of breath when performing activities of daily living.    PULMONARY REHAB OTHER RESPIRATORY from 05/18/2020 in [place]  Date 04/02/18  Educator [place]  Instruction Review Code 2- Demonstrated Understanding      Bronchial Hygiene / Breathing Techniques: - Discuss breathing mechanics, pursed-lip breathing technique,  proper posture, effective ways to clear airways, and other functional breathing  techniques   PULMONARY REHAB OTHER RESPIRATORY from 05/18/2020 in [place]  Date 04/09/18  Educator DC  Instruction Review Code 2- Demonstrated Understanding      Cleaning Equipment: - Provides group verbal and written instruction about the health risks of elevated stress, cause of high stress, and healthy ways to reduce stress.   PULMONARY REHAB OTHER RESPIRATORY from 05/18/2020 in [place]  Date 01/15/18  Educator [place]  Instruction Review Code 2- Demonstrated Understanding      Nutrition I: Fats: - Discuss the types of cholesterol, what cholesterol does to the body, and how cholesterol levels can be controlled.   PULMONARY REHAB OTHER RESPIRATORY from 05/18/2020 in [place]  Date 01/22/18  Educator [place]  Instruction Review Code 2- Demonstrated Understanding      Nutrition II: Labels: -Discuss the different components of food labels and how to read food labels.   PULMONARY REHAB OTHER RESPIRATORY from 05/18/2020 in [place]  Date 01/29/18  Educator DJ  Instruction Review Code 2- Demonstrated Understanding      Respiratory Infections: - Discuss the signs and symptoms of respiratory infections, ways to prevent respiratory infections, and the importance of seeking medical treatment when having a respiratory infection.   PULMONARY REHAB OTHER RESPIRATORY from 05/18/2020 in [place]  Date 02/05/18  Educator [place]  Instruction Review Code 2- Demonstrated Understanding      Stress I: Signs and Symptoms: - Discuss the causes of stress, how stress may lead to anxiety and depression, and ways to limit stress.   Stress II: Relaxation: -Discuss relaxation techniques to limit stress.   PULMONARY REHAB OTHER RESPIRATORY from 05/18/2020 in [place]  Date 04/20/20  Educator DF  Instruction Review Code 2- Demonstrated Understanding       Oxygen for Home/Travel: - Discuss how to prepare for travel when on oxygen and proper ways to transport and store oxygen to ensure safety.   Knowledge Questionnaire Score:   Core Components/Risk Factors/Patient Goals at Admission:   Core Components/Risk Factors/Patient Goals Review:   Goals and Risk Factor Review    Row Name 02/24/20 1610 03/17/20 1459 04/12/20 1430 05/11/20 0805 06/07/20 1449     Core Components/Risk Factors/Patient Goals Review   Personal Goals Review Weight Management/Obesity;Improve shortness of breath with ADL's  Breathe better; do more activities; go on trips w/o O2. Weight Management/Obesity;Improve shortness of breath with ADL's  Breathe better; do more activities; go on trips without using oxygen. Weight Management/Obesity;Improve shortness of breath with ADL's  Breathe better; do more activities; go on trips without O2. Weight Management/Obesity;Improve shortness of breath with ADL's  Breathe better; do more activities; go on trips without oxygen. Weight Management/Obesity;Improve shortness of breath with ADL's  Breathe better; do more activities; go on trips w/o O2.   Review Patient has completed 3 sessions maintaining his weight since he started the program. He is doing well so far and hopes to meet his goals as he continues. Will continue to monitor for progress.  Patient has completed 7 sessions losing 2 lbs since last 30 day review. He continues to do well in the program with progression. He has missed some sessions recently due to low back pain per patient. He is being followed by his pcp who reports right lower quadrand abdominal pain and right loin pain. He is ordering a Renal CT for next week. Hopefully, he will be able to return to the program. Will continue to monitor for progress. Patient has completed 13 sessions gaining 2 lbs since last 30 day review. He continues to do well in the program with progression. He reports his breathing has improved. He also  reports having more energy and feeling better overall. He says he is doing more around the house. He is pleaased with his progress in the program. Will continue to monitor for progress. Patient has completed 17 sessions gaining 3 lbs since last 30 day review. He continue to do well in the program with progression. He continues to attempt to go without his oxygen more and more but his O2 sats drops at times. He says he feels his breathing continues to improve and is feeling better overall. He is doing more activities around the house. Will continue to monitor for progress. Patient has completed 25 sessions gaining 3 lbs since last 30 day review. He continues to do well in the program with progression and presistant attendance. He continues to go without O2 more and more. He only uses at home with strenous activities and at night with CPAP with decreasing episodes of O2 saturation dropping. He feels like his breathing continues to improve and he feels better and stronger overall. Will continue to monitor for progress.   Expected Outcomes Patient will continue to attend sessions and complete the program meeting both his personal and program goals. Patient will continue to attend sessions and complete the program meeting both his personal and program goals. Patient will continue to attend sessions and complete the program meeting both his personal and program goals. Patient will continue to attend sessions and complete the program meeting both his personal and program goals. Patient will continue to attend sessions and complete the program meeting both his personal and program goals.          Core Components/Risk Factors/Patient Goals at Discharge (Final Review):   Goals and Risk Factor Review - 06/07/20 1449      Core Components/Risk Factors/Patient Goals Review   Personal Goals Review Weight Management/Obesity;Improve shortness of breath with ADL's   Breathe better; do more activities; go on trips w/o O2.    Review Patient has completed 25 sessions gaining 3 lbs since last 30 day review. He continues to do well in the program with progression and presistant attendance. He continues to go without O2 more and more. He only uses at home with strenous activities and at night with CPAP with decreasing episodes of O2 saturation dropping. He feels like his breathing continues to improve and he feels better and stronger overall. Will continue to monitor for progress.    Expected Outcomes Patient will continue to attend sessions and complete the program meeting both his personal and program goals.           ITP Comments:   Comments: ITP REVIEW Pt is making expected progress toward pulmonary rehab goals after completing 25 sessions. Recommend continued exercise, life style modification, education, and utilization of breathing techniques to increase stamina and strength and decrease shortness of breath with exertion.

## 2020-06-08 NOTE — Progress Notes (Signed)
Daily Session Note  Patient Details  Name: Ricardo Burch MRN: [id number] Date of Birth: 09/12/1961 Referring Provider:     PULMONARY REHAB OTHER RESP ORIENTATION from 02/14/2020 in [place]  Referring Provider Dr. [name]      Encounter Date: 06/08/2020  Check In:  Session Check In - 06/08/20 1046      Check-In   Supervising physician immediately available to respond to emergencies See telemetry face sheet for immediately available MD    Location [facility]    Staff Present Algis Downs, Exercise Physiologist;Noralee Dutko Rosezella [place], RN, [place]    Virtual Visit No    Medication changes reported     No    Fall or balance concerns reported    No    Tobacco Cessation No Change    Warm-up and Cool-down Performed as group-led instruction    Resistance Training Performed Yes    VAD Patient? No    PAD/SET Patient? No      Pain Assessment   Currently in Pain? No/denies    Pain Score 0-No pain    Multiple Pain Sites No           Capillary Blood Glucose: No results found for this or any previous visit (from the past 24 hour(s)).    Social History   Tobacco Use  Smoking Status Former Smoker  . Packs/day: 1.00  . Years: 38.00  . Pack years: 38.00  . Types: Cigarettes  . Quit date: 02/21/2020  . Years since quitting: 0.2  Smokeless Tobacco Never Used    Goals Met:  Proper associated with RPD/PD & O2 Sat Independence with exercise equipment Improved SOB with ADL's Using PLB without cueing & demonstrates good technique Exercise tolerated well No report of cardiac concerns or symptoms  Goals Unmet:  Not Applicable  Comments: [id number]   Dr. Kathie Dike is Medical Director for [facility].

## 2020-06-13 ENCOUNTER — Encounter (HOSPITAL_COMMUNITY): Payer: Medicare Other

## 2020-06-14 ENCOUNTER — Ambulatory Visit (INDEPENDENT_AMBULATORY_CARE_PROVIDER_SITE_OTHER): Payer: Medicare Other

## 2020-06-14 ENCOUNTER — Encounter (INDEPENDENT_AMBULATORY_CARE_PROVIDER_SITE_OTHER): Payer: Self-pay

## 2020-06-14 ENCOUNTER — Other Ambulatory Visit: Payer: Self-pay

## 2020-06-14 VITALS — BP 133/90 | HR 82 | Temp 98.0°F | Resp 18 | Ht 68.0 in | Wt 256.0 lb

## 2020-06-14 DIAGNOSIS — E291 Testicular hypofunction: Secondary | ICD-10-CM

## 2020-06-14 DIAGNOSIS — F524 Premature ejaculation: Secondary | ICD-10-CM

## 2020-06-14 DIAGNOSIS — N5201 Erectile dysfunction due to arterial insufficiency: Secondary | ICD-10-CM

## 2020-06-14 MED ORDER — TESTOSTERONE CYPIONATE 200 MG/ML IM SOLN
100.0000 mg | INTRAMUSCULAR | Status: DC
  Administered 2020-06-21 – 2020-06-28 (×2): 100 mg via INTRAMUSCULAR

## 2020-06-14 NOTE — Progress Notes (Signed)
Pt came for weekly testosterone injection. Pt was given IM of 0.94ml to the left thigh. Pt tolerated well;no complaints.

## 2020-06-15 ENCOUNTER — Encounter (HOSPITAL_COMMUNITY)
Admission: RE | Admit: 2020-06-15 | Discharge: 2020-06-15 | Disposition: A | Discharge status: Home / Self Care | Payer: Medicare Other | Source: Ambulatory Visit | Attending: Cardiovascular Disease | Admitting: Cardiovascular Disease

## 2020-06-15 VITALS — Wt 253.3 lb

## 2020-06-15 DIAGNOSIS — I5032 Chronic diastolic (congestive) heart failure: Secondary | ICD-10-CM | POA: Diagnosis not present

## 2020-06-15 DIAGNOSIS — J449 Chronic obstructive pulmonary disease, unspecified: Secondary | ICD-10-CM

## 2020-06-15 NOTE — Progress Notes (Signed)
Daily Session Note  Patient Details  Name: Ricardo Burch MRN: [id number] Date of Birth: December 17, 1960 Referring Provider:     PULMONARY REHAB OTHER RESP ORIENTATION from 02/14/2020 in [place]  Referring Provider Dr. [name]      Encounter Date: 06/15/2020  Check In:  Session Check In - 06/15/20 1045      Check-In   Supervising physician immediately available to respond to emergencies See telemetry face sheet for immediately available MD    Location [facility]    Staff Present Algis Downs, Exercise Physiologist;Rosalea Withrow Wynetta Emery, RN, BSN    Virtual Visit No    Medication changes reported     No    Fall or balance concerns reported    No    Tobacco Cessation No Change    Warm-up and Cool-down Performed as group-led instruction    Resistance Training Performed Yes    VAD Patient? No    PAD/SET Patient? No      Pain Assessment   Currently in Pain? No/denies    Pain Score 0-No pain    Multiple Pain Sites No           Capillary Blood Glucose: No results found for this or any previous visit (from the past 24 hour(s)).    Social History   Tobacco Use  Smoking Status Former Smoker  . Packs/day: 1.00  . Years: 38.00  . Pack years: 38.00  . Types: Cigarettes  . Quit date: 02/21/2020  . Years since quitting: 0.3  Smokeless Tobacco Never Used    Goals Met:  Proper associated with RPD/PD & O2 Sat Independence with exercise equipment Improved SOB with ADL's Exercise tolerated well No report of cardiac concerns or symptoms Strength training completed today  Goals Unmet:  Not Applicable  Comments: Check out 1145.   Dr. Kathie Dike is Medical Director for [facility] Pulmonary Re[facility].

## 2020-06-20 ENCOUNTER — Telehealth: Payer: Self-pay | Admitting: Pulmonary Disease

## 2020-06-20 ENCOUNTER — Encounter (HOSPITAL_COMMUNITY): Payer: Medicare Other

## 2020-06-20 DIAGNOSIS — J438 Other emphysema: Secondary | ICD-10-CM

## 2020-06-20 NOTE — Telephone Encounter (Signed)
Patient is returning phone call. Patient phone number is [phone].

## 2020-06-20 NOTE — Telephone Encounter (Signed)
lmtcb for pt.  

## 2020-06-21 ENCOUNTER — Other Ambulatory Visit: Payer: Self-pay

## 2020-06-21 ENCOUNTER — Ambulatory Visit (INDEPENDENT_AMBULATORY_CARE_PROVIDER_SITE_OTHER): Payer: Medicare Other

## 2020-06-21 ENCOUNTER — Encounter (INDEPENDENT_AMBULATORY_CARE_PROVIDER_SITE_OTHER): Payer: Self-pay

## 2020-06-21 VITALS — BP 120/74 | HR 93 | Temp 97.8°F | Resp 18 | Ht 67.0 in | Wt 252.0 lb

## 2020-06-21 DIAGNOSIS — F524 Premature ejaculation: Secondary | ICD-10-CM | POA: Diagnosis not present

## 2020-06-21 DIAGNOSIS — N5201 Erectile dysfunction due to arterial insufficiency: Secondary | ICD-10-CM

## 2020-06-21 DIAGNOSIS — E291 Testicular hypofunction: Secondary | ICD-10-CM

## 2020-06-21 MED ORDER — BREZTRI AEROSPHERE 160-9-4.8 MCG/ACT IN AERO: 2.0000 | INHALATION_SPRAY | Freq: Two times a day (BID) | RESPIRATORY_TRACT | 5 refills | Status: DC

## 2020-06-21 NOTE — Progress Notes (Signed)
Pt given 0.54ml IM to the tr thigh. Pt tolerated well, no complaints. Applied band-aid.

## 2020-06-21 NOTE — Telephone Encounter (Signed)
Rx for Ricardo Burch has been sent to preferred pharmacy for pt. Called and spoke with pt letting him know this had been done and he verbalized understanding.nothing further needed.

## 2020-06-22 ENCOUNTER — Encounter (HOSPITAL_COMMUNITY): Payer: Medicare Other

## 2020-06-27 ENCOUNTER — Encounter (HOSPITAL_COMMUNITY): Payer: Medicare Other

## 2020-06-28 ENCOUNTER — Ambulatory Visit (INDEPENDENT_AMBULATORY_CARE_PROVIDER_SITE_OTHER): Payer: Medicare Other

## 2020-06-28 ENCOUNTER — Ambulatory Visit (INDEPENDENT_AMBULATORY_CARE_PROVIDER_SITE_OTHER): Payer: Medicare Other | Admitting: Internal Medicine

## 2020-06-28 ENCOUNTER — Encounter (INDEPENDENT_AMBULATORY_CARE_PROVIDER_SITE_OTHER): Payer: Self-pay | Admitting: Internal Medicine

## 2020-06-28 ENCOUNTER — Other Ambulatory Visit: Payer: Self-pay

## 2020-06-28 VITALS — BP 130/70 | HR 71 | Temp 97.3°F | Resp 18 | Ht 67.0 in | Wt 257.0 lb

## 2020-06-28 DIAGNOSIS — I10 Essential (primary) hypertension: Secondary | ICD-10-CM

## 2020-06-28 DIAGNOSIS — F524 Premature ejaculation: Secondary | ICD-10-CM | POA: Diagnosis not present

## 2020-06-28 DIAGNOSIS — N5201 Erectile dysfunction due to arterial insufficiency: Secondary | ICD-10-CM | POA: Diagnosis not present

## 2020-06-28 DIAGNOSIS — E291 Testicular hypofunction: Secondary | ICD-10-CM | POA: Diagnosis not present

## 2020-06-28 DIAGNOSIS — E559 Vitamin D deficiency, unspecified: Secondary | ICD-10-CM

## 2020-06-28 LAB — COMPLETE METABOLIC PANEL WITH GFR
AG Ratio: 1.6 (calc) (ref 1.0–2.5)
ALT: 13 U/L (ref 9–46)
AST: 15 U/L (ref 10–35)
Albumin: 4.3 g/dL (ref 3.6–5.1)
Alkaline phosphatase (APISO): 76 U/L (ref 35–144)
BUN: 9 mg/dL (ref 7–25)
CO2: 35 mmol/L — ABNORMAL HIGH (ref 20–32)
Calcium: 8.9 mg/dL (ref 8.6–10.3)
Chloride: 97 mmol/L — ABNORMAL LOW (ref 98–110)
Creat: 0.86 mg/dL (ref 0.70–1.33)
GFR, Est African American: 112 mL/min/{1.73_m2} (ref >=60)
GFR, Est Non African American: 97 mL/min/{1.73_m2} (ref >=60)
Globulin: 2.7 g/dL (calc) (ref 1.9–3.7)
Glucose, Bld: 82 mg/dL (ref 65–99)
Potassium: 4.7 mmol/L (ref 3.5–5.3)
Sodium: 138 mmol/L (ref 135–146)
Total Bilirubin: 1.7 mg/dL — ABNORMAL HIGH (ref 0.2–1.2)
Total Protein: 7 g/dL (ref 6.1–8.1)

## 2020-06-28 LAB — VITAMIN D 25 HYDROXY (VIT D DEFICIENCY, FRACTURES): Vit D, 25-Hydroxy: 32 ng/mL (ref 30–100)

## 2020-06-28 NOTE — Progress Notes (Signed)
Pt given 0.43mL into IM left thigh. Pt tolerated well; no complaints. Applied band-aid.

## 2020-06-28 NOTE — Progress Notes (Signed)
Metrics: Intervention Frequency ACO  Documented Smoking Status Yearly  Screened one or more times in 24 months  Cessation Counseling or  Active cessation medication Past 24 months  Past 24 months   Guideline developer: UpToDate (See UpToDate for funding source) Date Released: 2014       Wellness Office Visit  Subjective:  Patient ID: Ricardo Burch, male    DOB: 02-15-1961  Age: 59 y.o. MRN: [id number]  CC: This man comes in for follow-up of hypertension, vitamin D deficiency. HPI  Overall, he is doing well. He has no specific complaints. He thinks he has gained weight mainly because of muscle gain. He continues on amlodipine for hypertension and he has been taking vitamin D3 10,000 units daily for vitamin D deficiency. Past Medical History:  Diagnosis Date  . Allergy   . CHF (congestive heart failure) ([place])   . Chronic kidney disease   . COPD (chronic obstructive pulmonary disease) ([place]) Dx 2015  . Depression   . Eczema    since childhood   . Erectile dysfunction 08/18/2019  . GERD (gastroesophageal reflux disease) 01/25/2020  . Hyperlipidemia 01/12/2018  . Hypertension   . Oxygen deficiency   . Sleep apnea    CPAP  . Substance abuse ([place])    MJ, cocaine  . Testicular failure 07/22/2019   Past Surgical History:  Procedure Laterality Date  . COLONOSCOPY WITH PROPOFOL N/A 06/25/2017   Procedure: COLONOSCOPY WITH PROPOFOL;  Surgeon: Daneil Dolin, MD;  [facility];  Service: Endoscopy;  Laterality: N/A;  2:00pm  . MULTIPLE EXTRACTIONS WITH ALVEOLOPLASTY N/A 03/22/2016   Procedure: MULTIPLE EXTRACTION WITH ALVEOLOPLASTY;  Surgeon: Diona Browner, DDS;  Location: [place];  Service: Oral Surgery;  Laterality: N/A;  . NO PAST SURGERIES    . POLYPECTOMY  06/25/2017   Procedure: POLYPECTOMY;  Surgeon: Daneil Dolin, MD;  Location: [facility];  Service: Endoscopy;;  colon     Family History  Problem Relation Age of Onset  . Arthritis Mother   . Hypertension Mother     . Stroke Mother        age 7  . Cerebral aneurysm Father   . Alcohol abuse Father   . Cancer Brother   . Diabetes Neg Hx   . Heart disease Neg Hx     Social History   Social History Narrative    Lives alone.    5 daughters 2, 47 yo twins, eldest 2 are married live in [place]. Retired.Girlfriend works in Armed forces logistics/support/administrative officer.   Social History   Tobacco Use  . Smoking status: Former Smoker    Packs/day: 1.00    Years: 38.00    Pack years: 38.00    Types: Cigarettes    Quit date: 02/21/2020    Years since quitting: 0.3  . Smokeless tobacco: Never Used  Substance Use Topics  . Alcohol use: Not Currently    Alcohol/week: 1.0 standard drink    Types: 1 Cans of beer per week    Comment: Socially x 1/month currently (as of 01/25/20); previously: occassionally: weekends, 6-pack or less on a day; or half pint of liquior    Current Meds  Medication Sig  . albuterol (VENTOLIN HFA) 108 (90 Base) MCG/ACT inhaler Inhale 1 or 2 puffs by mouth every 6 hours as needed for wheezing or shortness of breath  . amLODipine (NORVASC) 5 MG tablet Take 1 tablet (5 mg total) by mouth daily.  . Budeson-Glycopyrrol-Formoterol (BREZTRI AEROSPHERE) 160-9-4.8 MCG/ACT AERO Inhale  2 puffs into the lungs 2 (two) times daily.  . Cholecalciferol (VITAMIN D-3) 125 MCG (5000 UT) TABS Take 2 tablets by mouth daily.  . fluticasone (FLONASE) 50 MCG/ACT nasal spray INSTILL 2 SPRAYS IN EACH NOSTRIL TWICE DAILY  . ipratropium-albuterol (DUONEB) 0.5-2.5 (3) MG/3ML SOLN Take 3 mLs by nebulization every 4 (four) hours as needed.  . meloxicam (MOBIC) 7.5 MG tablet Take 1 tablet (7.5 mg total) by mouth daily.  . OXYGEN Inhale 3 L into the lungs daily. continuous  . pantoprazole (PROTONIX) 40 MG tablet Take 1 tablet (40 mg total) by mouth daily.  . roflumilast (DALIRESP) 500 MCG TABS tablet Take 1 tablet (500 mcg total) by mouth daily.  . tadalafil (CIALIS) 20 MG tablet Take 0.5 tablets (10 mg total) by mouth daily as needed  for erectile dysfunction.  [name] testosterone cypionate (DEPOTESTOSTERONE CYPIONATE) 200 MG/ML injection Inject 0.6 mLs (120 mg total) into the muscle once a week. 0.54ml/week      Depression screen [facility] 2/9 02/14/2020 01/12/2020 05/26/2018 04/29/2018 02/05/2018  Decreased Interest 0 0 0 0 0  Down, Depressed, Hopeless 1 0 0 0 0  PHQ - 2 Score 1 0 0 0 0  Altered sleeping 1 - - 0 -  Tired, decreased energy 1 - - - -  Change in appetite 0 - - 0 -  Feeling bad or failure about yourself  1 - - 1 -  Trouble concentrating 1 - - 0 -  Moving slowly or fidgety/restless 0 - - 0 -  Suicidal thoughts 0 - - 0 -  PHQ-9 Score 5 - - 1 -  Difficult doing work/chores Somewhat difficult - - - -  Some recent data might be hidden     Objective:   Today's Vitals: BP 130/70 (BP Location: Left Arm, Patient Position: Sitting, Cuff Size: Normal)   Pulse 71   Temp (!) 97.3 F (36.3 C) (Temporal)   Resp 18   Ht 5\' 7"  (1.702 m)   Wt 257 lb (116.6 kg)   SpO2 98% Comment: 3L of oxygen  BMI 40.25 kg/m  Vitals with BMI 06/28/2020 06/21/2020 06/15/2020  Height 5\' 7"  5\' 7"  -  Weight 257 lbs 252 lbs 253 lbs 5 oz  BMI 40.24 32.95 18.84  Systolic 166 063 -  Diastolic 70 74 -  Pulse 71 93 -     Physical Exam   He looks systemically well. He remains obese but his blood pressure is well controlled.    Assessment   1. Vitamin D deficiency disease   2. Essential hypertension   3. Testicular hypofunction       Tests ordered Orders Placed This Encounter  Procedures  . COMPLETE METABOLIC PANEL WITH GFR  . VITAMIN D 25 Hydroxy (Vit-D Deficiency, Fractures)     Plan: 1. He will continue with amlodipine for hypertension which is controlling his blood pressure. 2. He will continue with vitamin D3 10,000 units daily and we will check levels today. 3. He did get his testosterone injection in the office today. 4. I will see him in about 3 to 4 months time for an annual physical exam which she is already  on.   No orders of the defined types were placed in this encounter.   Doree Albee, MD

## 2020-06-29 ENCOUNTER — Encounter (HOSPITAL_COMMUNITY)
Admission: RE | Admit: 2020-06-29 | Discharge: 2020-06-29 | Disposition: A | Discharge status: Home / Self Care | Payer: Medicare Other | Source: Ambulatory Visit | Attending: Cardiology | Admitting: Cardiology

## 2020-06-29 DIAGNOSIS — J449 Chronic obstructive pulmonary disease, unspecified: Secondary | ICD-10-CM

## 2020-06-29 DIAGNOSIS — I5032 Chronic diastolic (congestive) heart failure: Secondary | ICD-10-CM

## 2020-06-29 NOTE — Progress Notes (Signed)
Daily Session Note  Patient Details  Name: Ricardo Burch MRN: [id number] Date of Birth: 12/20/1960 Referring Provider:     PULMONARY REHAB OTHER RESP ORIENTATION from 02/14/2020 in [place]  Referring Provider Dr. [name]      Encounter Date: 06/29/2020  Check In:  Session Check In - 06/29/20 1057      Check-In   Supervising physician immediately available to respond to emergencies See telemetry face sheet for immediately available MD    Location [facility]    Staff Present Ramon Dredge, RN, Madelaine Bhat, RN, BSN    Virtual Visit No    Medication changes reported     No    Fall or balance concerns reported    No    Tobacco Cessation No Change    Warm-up and Cool-down Performed as group-led instruction    Resistance Training Performed Yes    VAD Patient? No    PAD/SET Patient? No      Pain Assessment   Currently in Pain? No/denies           Capillary Blood Glucose: No results found for this or any previous visit (from the past 24 hour(s)).    Social History   Tobacco Use  Smoking Status Former Smoker  . Packs/day: 1.00  . Years: 38.00  . Pack years: 38.00  . Types: Cigarettes  . Quit date: 02/21/2020  . Years since quitting: 0.3  Smokeless Tobacco Never Used    Goals Met:  Proper associated with RPD/PD & O2 Sat Independence with exercise equipment Improved SOB with ADL's Using PLB without cueing & demonstrates good technique Exercise tolerated well No report of cardiac concerns or symptoms Strength training completed today  Goals Unmet:  Not Applicable  Comments: [id number]   Dr. Kathie Dike is Medical Director for [facility] Pulmonary Rehab.

## 2020-07-04 ENCOUNTER — Other Ambulatory Visit: Payer: Self-pay

## 2020-07-04 ENCOUNTER — Encounter (HOSPITAL_COMMUNITY)
Admission: RE | Admit: 2020-07-04 | Discharge: 2020-07-04 | Disposition: A | Discharge status: Home / Self Care | Payer: Medicare Other | Source: Ambulatory Visit | Attending: Internal Medicine | Admitting: Internal Medicine

## 2020-07-04 DIAGNOSIS — J449 Chronic obstructive pulmonary disease, unspecified: Secondary | ICD-10-CM

## 2020-07-04 DIAGNOSIS — I5032 Chronic diastolic (congestive) heart failure: Secondary | ICD-10-CM

## 2020-07-04 NOTE — Progress Notes (Signed)
Daily Session Note  Patient Details  Name: Ricardo Burch MRN: [id number] Date of Birth: 02/02/1961 Referring Provider:     PULMONARY REHAB OTHER RESP ORIENTATION from 02/14/2020 in [place]  Referring Provider Dr. [name]      Encounter Date: 07/04/2020  Check In:  Session Check In - 07/04/20 1100      Check-In   Supervising physician immediately available to respond to emergencies See telemetry face sheet for immediately available MD    Staff Present Geanie Cooley, Felipe Drone, RN, MHA    Virtual Visit No    Medication changes reported     No    Fall or balance concerns reported    No    Tobacco Cessation No Change    Warm-up and Cool-down Performed as group-led instruction    Resistance Training Performed Yes    VAD Patient? No    PAD/SET Patient? No      Pain Assessment   Currently in Pain? No/denies    Pain Score 0-No pain    Multiple Pain Sites No           Capillary Blood Glucose: No results found for this or any previous visit (from the past 24 hour(s)).    Social History   Tobacco Use  Smoking Status Former Smoker  . Packs/day: 1.00  . Years: 38.00  . Pack years: 38.00  . Types: Cigarettes  . Quit date: 02/21/2020  . Years since quitting: 0.3  Smokeless Tobacco Never Used    Goals Met:  Proper associated with RPD/PD & O2 Sat Independence with exercise equipment Improved SOB with ADL's Using PLB without cueing & demonstrates good technique Exercise tolerated well Personal goals reviewed No report of cardiac concerns or symptoms Strength training completed today  Goals Unmet:  Not Applicable  Comments: check out 12:00   Dr. Kathie Dike is Medical Director for [facility] Pulmonary Rehab.

## 2020-07-05 ENCOUNTER — Ambulatory Visit (INDEPENDENT_AMBULATORY_CARE_PROVIDER_SITE_OTHER): Payer: Medicare Other

## 2020-07-05 ENCOUNTER — Encounter (INDEPENDENT_AMBULATORY_CARE_PROVIDER_SITE_OTHER): Payer: Self-pay

## 2020-07-05 ENCOUNTER — Other Ambulatory Visit: Payer: Self-pay

## 2020-07-05 VITALS — BP 130/80 | HR 79 | Temp 93.7°F | Resp 18 | Ht 67.0 in | Wt 256.6 lb

## 2020-07-05 DIAGNOSIS — N5201 Erectile dysfunction due to arterial insufficiency: Secondary | ICD-10-CM | POA: Diagnosis not present

## 2020-07-05 DIAGNOSIS — J449 Chronic obstructive pulmonary disease, unspecified: Secondary | ICD-10-CM | POA: Diagnosis not present

## 2020-07-05 DIAGNOSIS — E291 Testicular hypofunction: Secondary | ICD-10-CM | POA: Diagnosis not present

## 2020-07-05 DIAGNOSIS — F524 Premature ejaculation: Secondary | ICD-10-CM | POA: Diagnosis not present

## 2020-07-05 DIAGNOSIS — J438 Other emphysema: Secondary | ICD-10-CM | POA: Diagnosis not present

## 2020-07-05 MED ORDER — TESTOSTERONE CYPIONATE 200 MG/ML IM SOLN
100.0000 mg | Freq: Once | INTRAMUSCULAR | Status: AC
  Administered 2020-07-05: 100 mg via INTRAMUSCULAR

## 2020-07-05 NOTE — Progress Notes (Signed)
Pt was given a 0.91mL to the Rt thigh. Pt tolerated well; no complaints.

## 2020-07-06 ENCOUNTER — Encounter (HOSPITAL_COMMUNITY)
Admission: RE | Admit: 2020-07-06 | Discharge: 2020-07-06 | Disposition: A | Discharge status: Home / Self Care | Payer: Medicare Other | Source: Ambulatory Visit | Attending: Internal Medicine | Admitting: Internal Medicine

## 2020-07-06 DIAGNOSIS — J449 Chronic obstructive pulmonary disease, unspecified: Secondary | ICD-10-CM

## 2020-07-06 DIAGNOSIS — I5032 Chronic diastolic (congestive) heart failure: Secondary | ICD-10-CM | POA: Insufficient documentation

## 2020-07-06 NOTE — Progress Notes (Signed)
Pulmonary Individual Treatment Plan  Patient Details  Name: Ricardo Burch MRN: [id number] Date of Birth: 1961/07/15 Referring Provider:     PULMONARY REHAB OTHER RESP ORIENTATION from 02/14/2020 in [place]  Referring Provider Dr. [name]      Initial Encounter Date:    [place] from 02/14/2020 in [place]  Date 02/14/20      Visit Diagnosis: Chronic obstructive pulmonary disease, unspecified COPD type ([place])  Chronic diastolic CHF (congestive heart failure) ([place])  Patient's Home Medications on Admission:   Current Outpatient Medications:  .  albuterol (VENTOLIN HFA) 108 (90 Base) MCG/ACT inhaler, Inhale 1 or 2 puffs by mouth every 6 hours as needed for wheezing or shortness of breath, Disp: 18 g, Rfl: 4 .  amLODipine (NORVASC) 5 MG tablet, Take 1 tablet (5 mg total) by mouth daily., Disp: 90 tablet, Rfl: 0 .  Budeson-Glycopyrrol-Formoterol (BREZTRI AEROSPHERE) 160-9-4.8 MCG/ACT AERO, Inhale 2 puffs into the lungs 2 (two) times daily., Disp: 10.7 g, Rfl: 5 .  Cholecalciferol (VITAMIN D-3) 125 MCG (5000 UT) TABS, Take 2 tablets by mouth daily., Disp: , Rfl:  .  fluticasone (FLONASE) 50 MCG/ACT nasal spray, INSTILL 2 SPRAYS IN EACH NOSTRIL TWICE DAILY, Disp: 16 mL, Rfl: 5 .  ipratropium-albuterol (DUONEB) 0.5-2.5 (3) MG/3ML SOLN, Take 3 mLs by nebulization every 4 (four) hours as needed., Disp: 360 mL, Rfl: 11 .  meloxicam (MOBIC) 7.5 MG tablet, Take 1 tablet (7.5 mg total) by mouth daily., Disp: 30 tablet, Rfl: 5 .  OXYGEN, Inhale 3 L into the lungs daily. continuous, Disp: , Rfl:  .  pantoprazole (PROTONIX) 40 MG tablet, Take 1 tablet (40 mg total) by mouth daily., Disp: 90 tablet, Rfl: 3 .  roflumilast (DALIRESP) 500 MCG TABS tablet, Take 1 tablet (500 mcg total) by mouth daily., Disp: 30 tablet, Rfl: 5 .  tadalafil (CIALIS) 20 MG tablet, Take 0.5 tablets (10 mg total) by mouth daily as needed for erectile  dysfunction., Disp: 10 tablet, Rfl: 2 .  testosterone cypionate (DEPOTESTOSTERONE CYPIONATE) 200 MG/ML injection, Inject 0.6 mLs (120 mg total) into the muscle once a week. 0.33ml/week, Disp: 10 mL, Rfl: 1  Past Medical History: Past Medical History:  Diagnosis Date  . Allergy   . CHF (congestive heart failure) ([place])   . Chronic kidney disease   . COPD (chronic obstructive pulmonary disease) ([place]) Dx 2015  . Depression   . Eczema    since childhood   . Erectile dysfunction 08/18/2019  . GERD (gastroesophageal reflux disease) 01/25/2020  . Hyperlipidemia 01/12/2018  . Hypertension   . Oxygen deficiency   . Sleep apnea    CPAP  . Substance abuse ([place])    MJ, cocaine  . Testicular failure 07/22/2019    Tobacco Use: Social History   Tobacco Use  Smoking Status Former Smoker  . Packs/day: 1.00  . Years: 38.00  . Pack years: 38.00  . Types: Cigarettes  . Quit date: 02/21/2020  . Years since quitting: 0.3  Smokeless Tobacco Never Used    Labs: Recent Review Flowsheet Data    Labs for ITP Cardiac and Pulmonary Rehab Latest Ref Rng & Units 07/27/2016 10/14/2017 09/23/2018 09/24/2018 07/22/2019   Cholestrol <200 mg/dL - 136 - 91 122   LDLCALC mg/dL (calc) - 60 - 49 68   HDL > OR = 40 mg/dL - 60 - 33(L) 40   Trlycerides <150 mg/dL - 82 - 43 67   Hemoglobin A1c 4.8 -  5.6 % - 5.3 5.5 - -   PHART 7.35 - 7.45 7.341(L) - - - -   PCO2ART 32 - 48 mmHg 84.2(HH) - - - -   HCO3 20.0 - 28.0 mmol/L 44.3(H) - - - -   TCO2 0 - 100 mmol/L - - - - -   O2SAT % 92.0 - - - -      Capillary Blood Glucose: Lab Results  Component Value Date   GLUCAP 84 11/20/2019   GLUCAP 93 05/12/2018   GLUCAP 179 (H) 07/27/2016   GLUCAP 156 (H) 07/27/2016   GLUCAP 140 (H) 09/16/2015     Pulmonary Assessment Scores:  Pulmonary Assessment Scores    Row Name 02/14/20 1338         ADL UCSD   ADL Phase Entry     SOB Score total 57     Rest 0     Walk 7     Stairs 4     Bath 3     Dress 2     Shop  3       CAT Score   CAT Score 17       mMRC Score   mMRC Score 4           UCSD: Self-administered rating of dyspnea associated with activities of daily living (ADLs) 6-point scale (0 = "not at all" to 5 = "maximal or unable to do because of breathlessness")  Scoring Scores range from 0 to 120.  Minimally important difference is 5 units  CAT: CAT can identify the health impairment of COPD patients and is better correlated with disease progression.  CAT has a scoring range of zero to 40. The CAT score is classified into four groups of low (less than 10), medium (10 - 20), high (21-30) and very high (31-40) based on the impact level of disease on health status. A CAT score over 10 suggests significant symptoms.  A worsening CAT score could be explained by an exacerbation, poor medication adherence, poor inhaler technique, or progression of COPD or comorbid conditions.  CAT MCID is 2 points  mMRC: mMRC (Modified Medical Research Council) Dyspnea Scale is used to assess the degree of baseline functional disability in patients of respiratory disease due to dyspnea. No minimal important difference is established. A decrease in score of 1 point or greater is considered a positive change.   Pulmonary Function Assessment:   Exercise Target Goals: Exercise Program Goal: Individual exercise prescription set using results from initial 6 min walk test and THRR while considering  patient's activity barriers and safety.   Exercise Prescription Goal: Initial exercise prescription builds to 30-45 minutes a day of aerobic activity, 2-3 days per week.  Home exercise guidelines will be given to patient during program as part of exercise prescription that the participant will acknowledge.  Activity Barriers & Risk Stratification:  Activity Barriers & Cardiac Risk Stratification - 02/14/20 1251      Activity Barriers & Cardiac Risk Stratification   Activity Barriers Deconditioning;Shortness of  Breath    Cardiac Risk Stratification Moderate           6 Minute Walk:  6 Minute Walk    Row Name 02/14/20 1247         6 Minute Walk   Phase Initial     Distance 800 feet     Walk Time 6 minutes     # of Rest Breaks 0     MPH 1.51  METS 2.16     RPE 12     Perceived Dyspnea  14     VO2 Peak 7.83     Symptoms Yes (comment)     Comments SOB     Resting HR 83 bpm     Resting BP 130/90     Resting Oxygen Saturation  96 %     Exercise Oxygen Saturation  during 6 min walk 89 %     Max Ex. HR 99 bpm     Max Ex. BP 148/92     2 Minute Post BP 120/88            Oxygen Initial Assessment:  Oxygen Initial Assessment - 02/14/20 1336      Home Oxygen   Home Oxygen Device Home Concentrator    Sleep Oxygen Prescription CPAP    Liters per minute 3    Home Exercise Oxygen Prescription Continuous    Liters per minute 2    Home at Rest Exercise Oxygen Prescription Continuous    Liters per minute 3    Compliance with Home Oxygen Use Yes      Initial 6 min Walk   Oxygen Used Continuous    Liters per minute 4      Program Oxygen Prescription   Program Oxygen Prescription Continuous    Liters per minute 4      Intervention   Short Term Goals To learn and exhibit compliance with exercise, home and travel O2 prescription;To learn and understand importance of monitoring SPO2 with pulse oximeter and demonstrate accurate use of the pulse oximeter.;To learn and understand importance of maintaining oxygen saturations>88%    Long  Term Goals Exhibits compliance with exercise, home and travel O2 prescription;Verbalizes importance of monitoring SPO2 with pulse oximeter and return demonstration;Maintenance of O2 saturations>88%;Exhibits proper breathing techniques, such as pursed lip breathing or other method taught during program session           Oxygen Re-Evaluation:  Oxygen Re-Evaluation    Row Name 02/24/20 1556 03/17/20 1458 04/12/20 1428 05/11/20 0758 06/07/20 1447       Program Oxygen Prescription   Program Oxygen Prescription None Continuous Continuous Continuous Continuous   Liters per minute 4 4 4 4 4    FiO2% 93 93 94 93 93     Home Oxygen   Home Oxygen Device Home Concentrator Home Concentrator Home Concentrator Home Concentrator Home Concentrator   Sleep Oxygen Prescription CPAP CPAP CPAP CPAP CPAP   Liters per minute 3 3 2 2 3    Home Exercise Oxygen Prescription Continuous Continuous None Continuous Continuous   Liters per minute 3 3 3 3 3    Home at Rest Exercise Oxygen Prescription Continuous None None None None   Liters per minute 3 3 -- -- --   Compliance with Home Oxygen Use Yes Yes Yes Yes Yes     Goals/Expected Outcomes   Short Term Goals To learn and exhibit compliance with exercise, home and travel O2 prescription;To learn and understand importance of monitoring SPO2 with pulse oximeter and demonstrate accurate use of the pulse oximeter.;To learn and understand importance of maintaining oxygen saturations>88%;To learn and demonstrate proper pursed lip breathing techniques or other breathing techniques. To learn and exhibit compliance with exercise, home and travel O2 prescription;To learn and understand importance of monitoring SPO2 with pulse oximeter and demonstrate accurate use of the pulse oximeter.;To learn and understand importance of maintaining oxygen saturations>88%;To learn and demonstrate proper pursed lip breathing techniques or other breathing  techniques. To learn and exhibit compliance with exercise, home and travel O2 prescription;To learn and understand importance of monitoring SPO2 with pulse oximeter and demonstrate accurate use of the pulse oximeter.;To learn and understand importance of maintaining oxygen saturations>88%;To learn and demonstrate proper pursed lip breathing techniques or other breathing techniques. To learn and exhibit compliance with exercise, home and travel O2 prescription;To learn and understand importance  of monitoring SPO2 with pulse oximeter and demonstrate accurate use of the pulse oximeter.;To learn and understand importance of maintaining oxygen saturations>88%;To learn and demonstrate proper pursed lip breathing techniques or other breathing techniques. To learn and exhibit compliance with exercise, home and travel O2 prescription;To learn and understand importance of monitoring SPO2 with pulse oximeter and demonstrate accurate use of the pulse oximeter.;To learn and understand importance of maintaining oxygen saturations>88%;To learn and demonstrate proper pursed lip breathing techniques or other breathing techniques.   Long  Term Goals Exhibits compliance with exercise, home and travel O2 prescription;Verbalizes importance of monitoring SPO2 with pulse oximeter and return demonstration;Maintenance of O2 saturations>88%;Exhibits proper breathing techniques, such as pursed lip breathing or other method taught during program session;Compliance with respiratory medication Exhibits compliance with exercise, home and travel O2 prescription;Verbalizes importance of monitoring SPO2 with pulse oximeter and return demonstration;Maintenance of O2 saturations>88%;Exhibits proper breathing techniques, such as pursed lip breathing or other method taught during program session;Compliance with respiratory medication Exhibits compliance with exercise, home and travel O2 prescription;Verbalizes importance of monitoring SPO2 with pulse oximeter and return demonstration;Maintenance of O2 saturations>88%;Exhibits proper breathing techniques, such as pursed lip breathing or other method taught during program session;Compliance with respiratory medication Exhibits compliance with exercise, home and travel O2 prescription;Verbalizes importance of monitoring SPO2 with pulse oximeter and return demonstration;Maintenance of O2 saturations>88%;Exhibits proper breathing techniques, such as pursed lip breathing or other method  taught during program session;Compliance with respiratory medication Exhibits compliance with exercise, home and travel O2 prescription;Verbalizes importance of monitoring SPO2 with pulse oximeter and return demonstration;Maintenance of O2 saturations>88%;Exhibits proper breathing techniques, such as pursed lip breathing or other method taught during program session;Compliance with respiratory medication   Comments Patient demonstrates proper pursed lip breathing techniques during exercise and also demonstrates proper usage of pulse oximeter. He is able to verbalize the importance of maintaining his O2 saturations >88%. He also says he is complaint with all his medication and his home exercise prescription. Will continue to monitor. Patient demonstrates proper pursed lip breathing techniques during exercise and also demonstrates proper usage of pulse oximeter. He is able to verbalize the importance of maintaining his O2 saturations >88%. He also says he is complaint with all his medication and his home exercise prescription. Will continue to monitor. Patient demonstrates proper pursed lip breathing techniques during exercise and also demonstrates proper usage of pulse oximeter. He is able to verbalize the importance of maintaining his O2 saturations >88%. He also says he is complaint with all his medication and his home exercise prescription. Will continue to monitor. Patient demonstrates proper pursed lip breathing techniques during exercise and also demonstrates proper usage of pulse oximeter. He is able to verbalize the importance of maintaining his O2 saturations >88%. He also says he is complaint with all his medication and his home exercise prescription. Will continue to monitor. Patient demonstrates proper pursed lip breathing techniques during exercise and also demonstrates proper usage of pulse oximeter. He is able to verbalize the importance of maintaining his O2 saturations >88%. He also says he is  complaint with all his medication and  his home exercise prescription. Will continue to monitor.   Goals/Expected Outcomes Patient will continue to meet both his short and long term goals.  Patient will continue to meet both his short and long term goals.  Patient will continue to meet both his short and long term goals.  Patient will continue to meet both his short and long term goals.  Patient will continue to meet both his short and long term goals.    [place] Name 07/05/20 0851             Program Oxygen Prescription   Program Oxygen Prescription Continuous       Liters per minute 4         Home Oxygen   Home Oxygen Device Home Concentrator       Sleep Oxygen Prescription CPAP       Liters per minute 3       Home Exercise Oxygen Prescription Continuous       Liters per minute 3       Home at Rest Exercise Oxygen Prescription None       Liters per minute 3       Compliance with Home Oxygen Use Yes         Goals/Expected Outcomes   Short Term Goals To learn and exhibit compliance with exercise, home and travel O2 prescription;To learn and understand importance of monitoring SPO2 with pulse oximeter and demonstrate accurate use of the pulse oximeter.;To learn and understand importance of maintaining oxygen saturations>88%;To learn and demonstrate proper pursed lip breathing techniques or other breathing techniques.       Long  Term Goals Exhibits compliance with exercise, home and travel O2 prescription;Verbalizes importance of monitoring SPO2 with pulse oximeter and return demonstration;Maintenance of O2 saturations>88%;Exhibits proper breathing techniques, such as pursed lip breathing or other method taught during program session;Compliance with respiratory medication       Comments Patient demonstrates proper pursed lip breathing techniques during exercise and also demonstrates proper usage of pulse oximeter. He is able to verbalize the importance of maintaining his O2 saturations >88%. He  also says he is complaint with all his medication and his home exercise prescription. Will continue to monitor.       Goals/Expected Outcomes Patient will continue to meet both his short and long term goals.               Oxygen Discharge (Final Oxygen Re-Evaluation):  Oxygen Re-Evaluation - 07/05/20 0851      Program Oxygen Prescription   Program Oxygen Prescription Continuous    Liters per minute 4      Home Oxygen   Home Oxygen Device Home Concentrator    Sleep Oxygen Prescription CPAP    Liters per minute 3    Home Exercise Oxygen Prescription Continuous    Liters per minute 3    Home at Rest Exercise Oxygen Prescription None    Liters per minute 3    Compliance with Home Oxygen Use Yes      Goals/Expected Outcomes   Short Term Goals To learn and exhibit compliance with exercise, home and travel O2 prescription;To learn and understand importance of monitoring SPO2 with pulse oximeter and demonstrate accurate use of the pulse oximeter.;To learn and understand importance of maintaining oxygen saturations>88%;To learn and demonstrate proper pursed lip breathing techniques or other breathing techniques.    Long  Term Goals Exhibits compliance with exercise, home and travel O2 prescription;Verbalizes importance of monitoring SPO2 with pulse oximeter and  return demonstration;Maintenance of O2 saturations>88%;Exhibits proper breathing techniques, such as pursed lip breathing or other method taught during program session;Compliance with respiratory medication    Comments Patient demonstrates proper pursed lip breathing techniques during exercise and also demonstrates proper usage of pulse oximeter. He is able to verbalize the importance of maintaining his O2 saturations >88%. He also says he is complaint with all his medication and his home exercise prescription. Will continue to monitor.    Goals/Expected Outcomes Patient will continue to meet both his short and long term goals.             Initial Exercise Prescription:  Initial Exercise Prescription - 02/14/20 1200      Date of Initial Exercise RX and Referring Provider   Date 02/14/20    Referring Provider Dr. [name]    Expected Discharge Date 05/15/20      Oxygen   Oxygen Continuous    Liters 3      Treadmill   MPH 1.2    Grade 0    Minutes 17    METs 1.91      Recumbant Elliptical   Level 1    RPM 33    Watts 30    Minutes 22    METs 1.6      Prescription Details   Frequency (times per week) 2    Duration Progress to 30 minutes of continuous aerobic without signs/symptoms of physical distress      Intensity   THRR 40-80% of Max Heartrate [id number]    Ratings of Perceived Exertion 11-13    Perceived Dyspnea 0-4      Progression   Progression Continue to progress workloads to maintain intensity without signs/symptoms of physical distress.      Resistance Training   Training Prescription Yes    Weight 1    Reps 10-15           Perform Capillary Blood Glucose checks as needed.  Exercise Prescription Changes:   Exercise Prescription Changes    Row Name 03/20/20 1500 04/03/20 1400 04/17/20 1500 04/20/20 1319 05/11/20 1132     Response to Exercise   Blood Pressure (Admit) 140/84 120/84 122/62 126/74 140/90   Blood Pressure (Exercise) 142/106 152/98 150/100 162/70 152/80   Blood Pressure (Exit) 122/84 134/78 120/86 136/80 132/78   Heart Rate (Admit) 91 bpm 89 bpm 81 bpm 77 bpm 75 bpm   Heart Rate (Exercise) 115 bpm 104 bpm 101 bpm 101 bpm 118 bpm   Heart Rate (Exit) 97 bpm 97 bpm 89 bpm 95 bpm 106 bpm   Oxygen Saturation (Admit) 95 % 93 % 93 % 94 % 94 %   Oxygen Saturation (Exercise) 91 % 93 % 92 % 93 % 90 %   Oxygen Saturation (Exit) 94 % 96 % 95 % 94 % 95 %   Rating of Perceived Exertion (Exercise) 11 11 12 11 11    Perceived Dyspnea (Exercise) 11 11 12 12 11    Symptoms -- None -- -- --   Duration Continue with 30 min of aerobic exercise without signs/symptoms of physical  distress. Continue with 30 min of aerobic exercise without signs/symptoms of physical distress. Continue with 30 min of aerobic exercise without signs/symptoms of physical distress. Continue with 30 min of aerobic exercise without signs/symptoms of physical distress. Continue with 30 min of aerobic exercise without signs/symptoms of physical distress.   Intensity -- THRR New THRR unchanged THRR unchanged THRR unchanged     Progression   Progression  Continue to progress workloads to maintain intensity without signs/symptoms of physical distress. Continue to progress workloads to maintain intensity without signs/symptoms of physical distress. Continue to progress workloads to maintain intensity without signs/symptoms of physical distress. Continue to progress workloads to maintain intensity without signs/symptoms of physical distress. Continue to progress workloads to maintain intensity without signs/symptoms of physical distress.   Average METs 1.9 2.82 -- -- --     Resistance Training   Training Prescription Yes Yes Yes Yes Yes   Weight 2 3 3 3 3    Reps 10-15 10-15 10-15 10-15 10-15     Oxygen   Oxygen Continuous Continuous Continuous Continuous Continuous   Liters 3 3 3 3 3      Treadmill   MPH 1.5 1.6 1.6 1.6 1.8   Grade 0 0 0 0 0   Minutes 17 17 17 17 17    METs 2.14 2.22 2.22 2.22 2.37     Recumbant Elliptical   Level 1 1 1 1 1    RPM 41 51 47 51 54   Watts 43 64 53 55 59   Minutes 22 22 22 22 22    METs 2.4 3.5 3 2.6 3.1     Home Exercise Plan   Plans to continue exercise at Home (comment) Home (comment) -- -- --   Frequency Add 3 additional days to program exercise sessions. Add 3 additional days to program exercise sessions. -- -- --   Initial Home Exercises Provided 02/14/20 02/14/20 -- -- --   [place] Name 05/30/20 1200 06/13/20 1300 06/15/20 1319         Response to Exercise   Blood Pressure (Admit) 120/70 134/82 126/78     Blood Pressure (Exercise) 130/72 142/82 170/100      Blood Pressure (Exit) 140/88 138/72 116/80     Heart Rate (Admit) 95 bpm 82 bpm 80 bpm     Heart Rate (Exercise) 107 bpm 97 bpm 116 bpm     Heart Rate (Exit) 101 bpm 90 bpm 95 bpm     Oxygen Saturation (Admit) 95 % 96 % 95 %     Oxygen Saturation (Exercise) 93 % 91 % 91 %     Oxygen Saturation (Exit) 95 % 95 % 96 %     Rating of Perceived Exertion (Exercise) 11 10 10      Perceived Dyspnea (Exercise) 11 11 11      Duration Continue with 30 min of aerobic exercise without signs/symptoms of physical distress. Continue with 30 min of aerobic exercise without signs/symptoms of physical distress. Continue with 30 min of aerobic exercise without signs/symptoms of physical distress.     Intensity THRR unchanged THRR unchanged THRR unchanged       Progression   Progression Continue to progress workloads to maintain intensity without signs/symptoms of physical distress. Continue to progress workloads to maintain intensity without signs/symptoms of physical distress. Continue to progress workloads to maintain intensity without signs/symptoms of physical distress.       Resistance Training   Training Prescription Yes Yes Yes     Weight 3 3 4      Reps 10-15 10-15 10-15       Oxygen   Oxygen Continuous Continuous Continuous     Liters 3 3 3        Treadmill   MPH 1.9 1.9 2     Grade 0 0 0     Minutes 17 17 17      METs 2.45 2.45 2.53  Recumbant Elliptical   Level 2 2 2      RPM 54 55 60     Watts 70 72 72     Minutes 22 22 22      METs 3.5 3.8 3.4            Exercise Comments:   Exercise Comments    Row Name 02/14/20 1306 02/24/20 1421 03/20/20 1546 04/03/20 1429     Exercise Comments This is the patients assessment/orientation visit. The patient has been in the program before and did well. He is eagar to get started This is patients 2nd visit. He is doing well so far. We will continue to progress as tolerated. Patient is attending regularly. Patient stated that he is using less  oxygen when he travels around town. He is doing well in the program and we will continue to progress as tolerated. Patients attendance is very consistant. He is doing well in the program. We will continue to progress as tolerated.           Exercise Goals and Review:   Exercise Goals    Row Name 02/14/20 1302 06/07/20 1401 07/05/20 0852         Exercise Goals   Increase Physical Activity Yes Yes Yes     Intervention Provide advice, education, support and counseling about physical activity/exercise needs.;Develop an individualized exercise prescription for aerobic and resistive training based on initial evaluation findings, risk stratification, comorbidities and participant's personal goals. Provide advice, education, support and counseling about physical activity/exercise needs.;Develop an individualized exercise prescription for aerobic and resistive training based on initial evaluation findings, risk stratification, comorbidities and participant's personal goals. Provide advice, education, support and counseling about physical activity/exercise needs.;Develop an individualized exercise prescription for aerobic and resistive training based on initial evaluation findings, risk stratification, comorbidities and participant's personal goals.     Expected Outcomes Short Term: Attend rehab on a regular basis to increase amount of physical activity.;Long Term: Add in home exercise to make exercise part of routine and to increase amount of physical activity. Short Term: Attend rehab on a regular basis to increase amount of physical activity.;Long Term: Add in home exercise to make exercise part of routine and to increase amount of physical activity.;Long Term: Exercising regularly at least 3-5 days a week. Short Term: Attend rehab on a regular basis to increase amount of physical activity.;Long Term: Add in home exercise to make exercise part of routine and to increase amount of physical activity.;Long  Term: Exercising regularly at least 3-5 days a week.     Increase Strength and Stamina Yes Yes Yes     Intervention Provide advice, education, support and counseling about physical activity/exercise needs.;Develop an individualized exercise prescription for aerobic and resistive training based on initial evaluation findings, risk stratification, comorbidities and participant's personal goals. Provide advice, education, support and counseling about physical activity/exercise needs.;Develop an individualized exercise prescription for aerobic and resistive training based on initial evaluation findings, risk stratification, comorbidities and participant's personal goals. Provide advice, education, support and counseling about physical activity/exercise needs.;Develop an individualized exercise prescription for aerobic and resistive training based on initial evaluation findings, risk stratification, comorbidities and participant's personal goals.     Expected Outcomes Short Term: Perform resistance training exercises routinely during rehab and add in resistance training at home;Long Term: Improve cardiorespiratory fitness, muscular endurance and strength as measured by increased METs and functional capacity (6MWT) Short Term: Increase workloads from initial exercise prescription for resistance, speed, and METs.;Short Term: Perform resistance training  exercises routinely during rehab and add in resistance training at home;Long Term: Improve cardiorespiratory fitness, muscular endurance and strength as measured by increased METs and functional capacity (6MWT) Short Term: Increase workloads from initial exercise prescription for resistance, speed, and METs.;Short Term: Perform resistance training exercises routinely during rehab and add in resistance training at home;Long Term: Improve cardiorespiratory fitness, muscular endurance and strength as measured by increased METs and functional capacity (6MWT)     Able to  understand and use rate of perceived exertion (RPE) scale Yes Yes Yes     Intervention Provide education and explanation on how to use RPE scale Provide education and explanation on how to use RPE scale Provide education and explanation on how to use RPE scale     Expected Outcomes Short Term: Able to use RPE daily in rehab to express subjective intensity level;Long Term:  Able to use RPE to guide intensity level when exercising independently Short Term: Able to use RPE daily in rehab to express subjective intensity level;Long Term:  Able to use RPE to guide intensity level when exercising independently Short Term: Able to use RPE daily in rehab to express subjective intensity level;Long Term:  Able to use RPE to guide intensity level when exercising independently     Able to understand and use Dyspnea scale Yes Yes Yes     Intervention Provide education and explanation on how to use Dyspnea scale Provide education and explanation on how to use Dyspnea scale Provide education and explanation on how to use Dyspnea scale     Expected Outcomes Short Term: Able to use Dyspnea scale daily in rehab to express subjective sense of shortness of breath during exertion;Long Term: Able to use Dyspnea scale to guide intensity level when exercising independently Short Term: Able to use Dyspnea scale daily in rehab to express subjective sense of shortness of breath during exertion;Long Term: Able to use Dyspnea scale to guide intensity level when exercising independently Short Term: Able to use Dyspnea scale daily in rehab to express subjective sense of shortness of breath during exertion;Long Term: Able to use Dyspnea scale to guide intensity level when exercising independently     Knowledge and understanding of Target Heart Rate Range (THRR) Yes -- Yes     Intervention Provide education and explanation of THRR including how the numbers were predicted and where they are located for reference Provide education and  explanation of THRR including how the numbers were predicted and where they are located for reference Provide education and explanation of THRR including how the numbers were predicted and where they are located for reference     Expected Outcomes Short Term: Able to use daily as guideline for intensity in rehab;Long Term: Able to use THRR to govern intensity when exercising independently Short Term: Able to state/look up THRR;Long Term: Able to use THRR to govern intensity when exercising independently Short Term: Able to state/look up THRR;Long Term: Able to use THRR to govern intensity when exercising independently     Able to check pulse independently Yes -- --     Intervention Provide education and demonstration on how to check pulse in carotid and radial arteries.;Review the importance of being able to check your own pulse for safety during independent exercise -- --     Expected Outcomes Short Term: Able to explain why pulse checking is important during independent exercise;Long Term: Able to check pulse independently and accurately -- --     Understanding of Exercise Prescription Yes Yes Yes  Intervention Provide education, explanation, and written materials on patient's individual exercise prescription Provide education, explanation, and written materials on patient's individual exercise prescription Provide education, explanation, and written materials on patient's individual exercise prescription     Expected Outcomes Short Term: Able to explain program exercise prescription;Long Term: Able to explain home exercise prescription to exercise independently Short Term: Able to explain program exercise prescription;Long Term: Able to explain home exercise prescription to exercise independently Short Term: Able to explain program exercise prescription;Long Term: Able to explain home exercise prescription to exercise independently            Exercise Goals Re-Evaluation :  Exercise Goals  Re-Evaluation    Row Name 03/20/20 1544 04/03/20 1426 05/05/20 1553 06/07/20 1402 07/05/20 0852     Exercise Goal Re-Evaluation   Exercise Goals Review Increase Physical Activity;Increase Strength and Stamina Increase Physical Activity;Increase Strength and Stamina Increase Physical Activity;Increase Strength and Stamina Increase Physical Activity;Increase Strength and Stamina;Able to understand and use rate of perceived exertion (RPE) scale;Able to understand and use Dyspnea scale;Knowledge and understanding of Target Heart Rate Range (THRR);Understanding of Exercise Prescription Increase Physical Activity;Increase Strength and Stamina;Able to understand and use rate of perceived exertion (RPE) scale;Able to understand and use Dyspnea scale;Knowledge and understanding of Target Heart Rate Range (THRR);Understanding of Exercise Prescription   Comments Patient wants to breathe better and to be able to go on trips without oxygen. Abdulkareem's goals are to breathe better and to be able to go on trips w/o oxygen. [place] is doing really well. He is able to tolerate his progressions w/o any any abnormal S/S. He is still not able to go without his oxygen for long periods. He workloads are increasing with each session. We will continue to monitor his progress. He is breathing alot better. He is progressing at a steady pace. He shows great effort toward reducing his use of continuous oxgen. We will continue to progress as tolerated Patient said that he is breathing alot better. He is not using as much oxygen as before. He only uses it when he is going on long trips or doing vigorous activity. He is currently motivated to lose weight and change his diet. He is tolerating increased workloads well. We will continue to progress as tolerated. Pt has attended 28 exercise sessions. His attendance has been infrequent since he has been in the program. He currently exercises at 3.7 METs on the seated elliptical. Will continue to monitor  and progress as able.   Expected Outcomes To reach expected goals To reach his expected goals. To reach expected goals To reach expected goals Through exercise at rehab and by engaging in a home exercise program, the pt will reach their goals.          Discharge Exercise Prescription (Final Exercise Prescription Changes):  Exercise Prescription Changes - 06/15/20 1319      Response to Exercise   Blood Pressure (Admit) 126/78    Blood Pressure (Exercise) 170/100    Blood Pressure (Exit) 116/80    Heart Rate (Admit) 80 bpm    Heart Rate (Exercise) 116 bpm    Heart Rate (Exit) 95 bpm    Oxygen Saturation (Admit) 95 %    Oxygen Saturation (Exercise) 91 %    Oxygen Saturation (Exit) 96 %    Rating of Perceived Exertion (Exercise) 10    Perceived Dyspnea (Exercise) 11    Duration Continue with 30 min of aerobic exercise without signs/symptoms of physical distress.    Intensity  THRR unchanged      Progression   Progression Continue to progress workloads to maintain intensity without signs/symptoms of physical distress.      Resistance Training   Training Prescription Yes    Weight 4    Reps 10-15      Oxygen   Oxygen Continuous    Liters 3      Treadmill   MPH 2    Grade 0    Minutes 17    METs 2.53      Recumbant Elliptical   Level 2    RPM 60    Watts 72    Minutes 22    METs 3.4           Nutrition:  Target Goals: Understanding of nutrition guidelines, daily intake of sodium 1500mg , cholesterol 200mg , calories 30% from fat and 7% or less from saturated fats, daily to have 5 or more servings of fruits and vegetables.  Biometrics:  Pre Biometrics - 02/14/20 1310      Pre Biometrics   Height 5\' 7"  (1.702 m)    Weight 113.2 kg    Waist Circumference 45 inches    Hip Circumference 41.5 inches    Waist to Hip Ratio 1.08 %    BMI (Calculated) 39.08    Triceps Skinfold 6 mm    % Body Fat 30.3 %    Grip Strength 50.9 kg    Flexibility 0 in    Single Leg  Stand 4.73 seconds            Nutrition Therapy Plan and Nutrition Goals:  Nutrition Therapy & Goals - 07/05/20 1451      Personal Nutrition Goals   Comments Patient continues to say he is eating more salads and cold cuts eating one big meal/day and supplementing with snacks. Will continue to montior.      Intervention Plan   Intervention Nutrition handout(s) given to patient.           Nutrition Assessments:   Nutrition Goals Re-Evaluation:   Nutrition Goals Discharge (Final Nutrition Goals Re-Evaluation):   Psychosocial: Target Goals: Acknowledge presence or absence of significant depression and/or stress, maximize coping skills, provide positive support system. Participant is able to verbalize types and ability to use techniques and skills needed for reducing stress and depression.  Initial Review & Psychosocial Screening:   Quality of Life Scores:  Quality of Life - 02/14/20 1312      Quality of Life   Select Quality of Life      Quality of Life Scores   Health/Function Pre 20.81 %    Socioeconomic Pre 20.5 %    Psych/Spiritual Pre 21 %    Family Pre 20.4 %    GLOBAL Pre 20.72 %          Scores of 19 and below usually indicate a poorer quality of life in these areas.  A difference of  2-3 points is a clinically meaningful difference.  A difference of 2-3 points in the total score of the Quality of Life Index has been associated with significant improvement in overall quality of life, self-image, physical symptoms, and general health in studies assessing change in quality of life.   PHQ-9: Recent Review Flowsheet Data    Depression screen [facility] 2/9 02/14/2020 01/12/2020 05/26/2018 04/29/2018 02/05/2018   Decreased Interest 0 0 0 0 0   Down, Depressed, Hopeless 1 0 0 0 0   PHQ - 2 Score 1 0 0 0 0  Altered sleeping 1 - - 0 -   Tired, decreased energy 1 - - - -   Change in appetite 0 - - 0 -   Feeling bad or failure about yourself  1 - - 1 -   Trouble  concentrating 1 - - 0 -   Moving slowly or fidgety/restless 0 - - 0 -   Suicidal thoughts 0 - - 0 -   PHQ-9 Score 5 - - 1 -   Difficult doing work/chores Somewhat difficult - - - -     Interpretation of Total Score  Total Score Depression Severity:  1-4 = Minimal depression, 5-9 = Mild depression, 10-14 = Moderate depression, 15-19 = Moderately severe depression, 20-27 = Severe depression   Psychosocial Evaluation and Intervention:   Psychosocial Re-Evaluation:  Psychosocial Re-Evaluation    Row Name 02/24/20 1611 03/17/20 1459 04/12/20 1430 05/11/20 0804 06/07/20 1452     Psychosocial Re-Evaluation   Current issues with None Identified None Identified None Identified -- None Identified   Comments Patient's initial QOL score is 20.72 and his PHQ-9 score is 5 with no psychosocial issues identified. Will continue to monitor. Patient's initial QOL score is 20.72 and his PHQ-9 score is 5 with no psychosocial issues identified. Will continue to monitor. Patient's initial QOL score is 20.72 and his PHQ-9 score is 5 with no psychosocial issues identified. Will continue to monitor. Patient continues to have no psychosocial issues identified. Will continue to montior. Patient continues to have no psychosocial issues identified. Will continue to montior.   Expected Outcomes Patient will have no psychosocial issues identified at discharge. Patient will have no psychosocial issues identified at discharge. Patient will have no psychosocial issues identified at discharge. Patient will have no psychosocial issues identified at discharge. Patient will have no psychosocial issues identified at discharge.   Interventions Stress management education;Relaxation education;Encouraged to attend Pulmonary Rehabilitation for the exercise Stress management education;Relaxation education;Encouraged to attend Pulmonary Rehabilitation for the exercise Stress management education;Relaxation education;Encouraged to attend  Pulmonary Rehabilitation for the exercise Stress management education;Relaxation education;Encouraged to attend Pulmonary Rehabilitation for the exercise Stress management education;Relaxation education;Encouraged to attend Pulmonary Rehabilitation for the exercise   Continue Psychosocial Services  No Follow up required No Follow up required No Follow up required No Follow up required No Follow up required   [place] Name 07/05/20 1451             Psychosocial Re-Evaluation   Current issues with None Identified       Comments Patient continues to have no psychosocial issues identified. Will continue to montior.       Expected Outcomes Patient will have no psychosocial issues identified at discharge.       Interventions Stress management education;Relaxation education;Encouraged to attend Pulmonary Rehabilitation for the exercise       Continue Psychosocial Services  No Follow up required              Psychosocial Discharge (Final Psychosocial Re-Evaluation):  Psychosocial Re-Evaluation - 07/05/20 1451      Psychosocial Re-Evaluation   Current issues with None Identified    Comments Patient continues to have no psychosocial issues identified. Will continue to montior.    Expected Outcomes Patient will have no psychosocial issues identified at discharge.    Interventions Stress management education;Relaxation education;Encouraged to attend Pulmonary Rehabilitation for the exercise    Continue Psychosocial Services  No Follow up required            Education: Education  Goals: Education classes will be provided on a weekly basis, covering required topics. Participant will state understanding/return demonstration of topics presented.  Learning Barriers/Preferences:   Education Topics: How Lungs Work and Diseases: - Discuss the anatomy of the lungs and diseases that can affect the lungs, such as COPD.   PULMONARY REHAB OTHER RESPIRATORY from 06/08/2020 in [place]  Date 03/12/18  Educator D. Coad  Instruction Review Code 2- Demonstrated Understanding      Exercise: -Discuss the importance of exercise, FITT principles of exercise, normal and abnormal responses to exercise, and how to exercise safely.   PULMONARY REHAB OTHER RESPIRATORY from 06/08/2020 in [place]  Date 03/05/18  Educator [place]  Instruction Review Code 2- Demonstrated Understanding      Environmental Irritants: -Discuss types of environmental irritants and how to limit exposure to environmental irritants.   PULMONARY REHAB OTHER RESPIRATORY from 06/08/2020 in [place]  Date 05/18/20  Educator Etheleen Mayhew  Instruction Review Code 1- Verbalizes Understanding      Meds/Inhalers and oxygen: - Discuss respiratory medications, definition of an inhaler and oxygen, and the proper way to use an inhaler and oxygen.   PULMONARY REHAB OTHER RESPIRATORY from 06/08/2020 in [place]  Date 03/26/18  Educator [place]      Energy Saving Techniques: - Discuss methods to conserve energy and decrease shortness of breath when performing activities of daily living.    PULMONARY REHAB OTHER RESPIRATORY from 06/08/2020 in [place]  Date 04/02/18  Educator [place]  Instruction Review Code 2- Demonstrated Understanding      Bronchial Hygiene / Breathing Techniques: - Discuss breathing mechanics, pursed-lip breathing technique,  proper posture, effective ways to clear airways, and other functional breathing techniques   PULMONARY REHAB OTHER RESPIRATORY from 06/08/2020 in [place]  Date 04/09/18  Educator DC  Instruction Review Code 2- Demonstrated Understanding      Cleaning Equipment: - Provides group verbal and written instruction about the health risks of elevated stress, cause of high stress, and healthy ways to reduce stress.   PULMONARY REHAB OTHER RESPIRATORY from  06/08/2020 in [place]  Date 01/15/18  Educator [place]  Instruction Review Code 2- Demonstrated Understanding      Nutrition I: Fats: - Discuss the types of cholesterol, what cholesterol does to the body, and how cholesterol levels can be controlled.   PULMONARY REHAB OTHER RESPIRATORY from 06/08/2020 in [place]  Date 01/22/18  Educator [place]  Instruction Review Code 2- Demonstrated Understanding      Nutrition II: Labels: -Discuss the different components of food labels and how to read food labels.   PULMONARY REHAB OTHER RESPIRATORY from 06/08/2020 in [place]  Date 01/29/18  Educator DJ  Instruction Review Code 2- Demonstrated Understanding      Respiratory Infections: - Discuss the signs and symptoms of respiratory infections, ways to prevent respiratory infections, and the importance of seeking medical treatment when having a respiratory infection.   PULMONARY REHAB OTHER RESPIRATORY from 06/08/2020 in [place]  Date 02/05/18  Educator [place]  Instruction Review Code 2- Demonstrated Understanding      Stress I: Signs and Symptoms: - Discuss the causes of stress, how stress may lead to anxiety and depression, and ways to limit stress.   Stress II: Relaxation: -Discuss relaxation techniques to limit stress.   PULMONARY REHAB OTHER RESPIRATORY from 06/08/2020 in [place]  CARDIAC REHABILITATION  Date 04/20/20  Educator DF  Instruction Review Code 2- Demonstrated Understanding      Oxygen for Home/Travel: - Discuss how to prepare for travel when on oxygen and proper ways to transport and store oxygen to ensure safety.   Knowledge Questionnaire Score:   Core Components/Risk Factors/Patient Goals at Admission:   Core Components/Risk Factors/Patient Goals Review:   Goals and Risk Factor Review    Row Name 02/24/20 1610 03/17/20 1459 04/12/20 1430 05/11/20 0805 06/07/20 1449      Core Components/Risk Factors/Patient Goals Review   Personal Goals Review Weight Management/Obesity;Improve shortness of breath with ADL's  Breathe better; do more activities; go on trips w/o O2. Weight Management/Obesity;Improve shortness of breath with ADL's  Breathe better; do more activities; go on trips without using oxygen. Weight Management/Obesity;Improve shortness of breath with ADL's  Breathe better; do more activities; go on trips without O2. Weight Management/Obesity;Improve shortness of breath with ADL's  Breathe better; do more activities; go on trips without oxygen. Weight Management/Obesity;Improve shortness of breath with ADL's  Breathe better; do more activities; go on trips w/o O2.   Review Patient has completed 3 sessions maintaining his weight since he started the program. He is doing well so far and hopes to meet his goals as he continues. Will continue to monitor for progress. Patient has completed 7 sessions losing 2 lbs since last 30 day review. He continues to do well in the program with progression. He has missed some sessions recently due to low back pain per patient. He is being followed by his pcp who reports right lower quadrand abdominal pain and right loin pain. He is ordering a Renal CT for next week. Hopefully, he will be able to return to the program. Will continue to monitor for progress. Patient has completed 13 sessions gaining 2 lbs since last 30 day review. He continues to do well in the program with progression. He reports his breathing has improved. He also reports having more energy and feeling better overall. He says he is doing more around the house. He is pleaased with his progress in the program. Will continue to monitor for progress. Patient has completed 17 sessions gaining 3 lbs since last 30 day review. He continue to do well in the program with progression. He continues to attempt to go without his oxygen more and more but his O2 sats drops at times. He says he  feels his breathing continues to improve and is feeling better overall. He is doing more activities around the house. Will continue to monitor for progress. Patient has completed 25 sessions gaining 3 lbs since last 30 day review. He continues to do well in the program with progression and presistant attendance. He continues to go without O2 more and more. He only uses at home with strenous activities and at night with CPAP with decreasing episodes of O2 saturation dropping. He feels like his breathing continues to improve and he feels better and stronger overall. Will continue to monitor for progress.   Expected Outcomes Patient will continue to attend sessions and complete the program meeting both his personal and program goals. Patient will continue to attend sessions and complete the program meeting both his personal and program goals. Patient will continue to attend sessions and complete the program meeting both his personal and program goals. Patient will continue to attend sessions and complete the program meeting both his personal and program goals. Patient will continue to attend sessions and complete the program  meeting both his personal and program goals.   [place] Name 07/05/20 1451             Core Components/Risk Factors/Patient Goals Review   Personal Goals Review Weight Management/Obesity;Improve shortness of breath with ADL's  Breathe better; do more activities/go on trips w/o O2.       Review Patient has completed 29 sessions losing 4 lbs since last 30 day review. He continues to do well in the program with progression. His attendance has been somewhat consistent. He says he is still using O2 at home less and less. He does use when he is vaccuming and doing other strenous activities. He says he is compliant with his medications and oxygen therapy. He says he continues to gain strength and energy and plans to join maintenance when he finishes the program. Will continue to montior for progress.        Expected Outcomes Patient will continue to attend sessions and complete the program meeting both his personal and program goals.              Core Components/Risk Factors/Patient Goals at Discharge (Final Review):   Goals and Risk Factor Review - 07/05/20 1451      Core Components/Risk Factors/Patient Goals Review   Personal Goals Review Weight Management/Obesity;Improve shortness of breath with ADL's   Breathe better; do more activities/go on trips w/o O2.   Review Patient has completed 29 sessions losing 4 lbs since last 30 day review. He continues to do well in the program with progression. His attendance has been somewhat consistent. He says he is still using O2 at home less and less. He does use when he is vaccuming and doing other strenous activities. He says he is compliant with his medications and oxygen therapy. He says he continues to gain strength and energy and plans to join maintenance when he finishes the program. Will continue to montior for progress.    Expected Outcomes Patient will continue to attend sessions and complete the program meeting both his personal and program goals.           ITP Comments:   Comments: ITP REVIEW Pt is making expected progress toward pulmonary rehab goals after completing 29 sessions. Recommend continued exercise, life style modification, education, and utilization of breathing techniques to increase stamina and strength and decrease shortness of breath with exertion.

## 2020-07-06 NOTE — Progress Notes (Signed)
Daily Session Note  Patient Details  Name: Gerell Fortson MRN: [id number] Date of Birth: 07-08-1961 Referring Provider:     PULMONARY REHAB OTHER RESP ORIENTATION from 02/14/2020 in [place]  Referring Provider Dr. [name]      Encounter Date: 07/06/2020  Check In:  Session Check In - 07/06/20 1054      Check-In   Supervising physician immediately available to respond to emergencies See telemetry face sheet for immediately available MD    Location [facility]    Staff Present Ramon Dredge, RN, Madelaine Bhat, RN, BSN    Virtual Visit No    Medication changes reported     No    Fall or balance concerns reported    No    Tobacco Cessation No Change    Warm-up and Cool-down Performed as group-led instruction    Resistance Training Performed Yes    VAD Patient? No    PAD/SET Patient? No      Pain Assessment   Currently in Pain? No/denies           Capillary Blood Glucose: No results found for this or any previous visit (from the past 24 hour(s)).    Social History   Tobacco Use  Smoking Status Former Smoker  . Packs/day: 1.00  . Years: 38.00  . Pack years: 38.00  . Types: Cigarettes  . Quit date: 02/21/2020  . Years since quitting: 0.3  Smokeless Tobacco Never Used    Goals Met:  Proper associated with RPD/PD & O2 Sat Independence with exercise equipment Improved SOB with ADL's Using PLB without cueing & demonstrates good technique Exercise tolerated well No report of cardiac concerns or symptoms Strength training completed today  Goals Unmet:  Not Applicable  Comments: [id number]   Dr. Kathie Dike is Medical Director for [facility].

## 2020-07-11 ENCOUNTER — Encounter (HOSPITAL_COMMUNITY)
Admission: RE | Admit: 2020-07-11 | Discharge: 2020-07-11 | Disposition: A | Discharge status: Home / Self Care | Payer: Medicare Other | Source: Ambulatory Visit | Attending: Internal Medicine | Admitting: Internal Medicine

## 2020-07-11 ENCOUNTER — Other Ambulatory Visit: Payer: Self-pay

## 2020-07-11 VITALS — Wt 254.4 lb

## 2020-07-11 DIAGNOSIS — I5032 Chronic diastolic (congestive) heart failure: Secondary | ICD-10-CM

## 2020-07-11 DIAGNOSIS — J449 Chronic obstructive pulmonary disease, unspecified: Secondary | ICD-10-CM

## 2020-07-11 NOTE — Progress Notes (Signed)
Daily Session Note  Patient Details  Name: Ricardo Burch MRN: [id number] Date of Birth: 12-28-1960 Referring Provider:     PULMONARY REHAB OTHER RESP ORIENTATION from 02/14/2020 in [place]  Referring Provider Dr. [name]      Encounter Date: 07/11/2020  Check In:  Session Check In - 07/11/20 1045      Check-In   Supervising physician immediately available to respond to emergencies See telemetry face sheet for immediately available MD    Location [facility]    Staff Present Ricardo Register, MS, ACSM-CEP, Exercise Physiologist;Renai Lopata Wynetta Emery, RN, BSN    Virtual Visit No    Medication changes reported     No    Fall or balance concerns reported    No    Tobacco Cessation No Change    Warm-up and Cool-down Performed as group-led instruction    Resistance Training Performed Yes    VAD Patient? No    PAD/SET Patient? No      Pain Assessment   Currently in Pain? No/denies    Pain Score 0-No pain    Multiple Pain Sites No           Capillary Blood Glucose: No results found for this or any previous visit (from the past 24 hour(s)).    Social History   Tobacco Use  Smoking Status Former Smoker  . Packs/day: 1.00  . Years: 38.00  . Pack years: 38.00  . Types: Cigarettes  . Quit date: 02/21/2020  . Years since quitting: 0.3  Smokeless Tobacco Never Used    Goals Met:  Proper associated with RPD/PD & O2 Sat Independence with exercise equipment Improved SOB with ADL's Using PLB without cueing & demonstrates good technique Exercise tolerated well No report of cardiac concerns or symptoms Strength training completed today  Goals Unmet:  Not Applicable  Comments: Check out 1145.   Dr. Kathie Dike is Medical Director for [facility].

## 2020-07-12 ENCOUNTER — Ambulatory Visit (INDEPENDENT_AMBULATORY_CARE_PROVIDER_SITE_OTHER): Payer: Medicare Other

## 2020-07-12 ENCOUNTER — Encounter (INDEPENDENT_AMBULATORY_CARE_PROVIDER_SITE_OTHER): Payer: Self-pay

## 2020-07-12 VITALS — Temp 98.4°F | Resp 18 | Ht 68.0 in | Wt 256.0 lb

## 2020-07-12 DIAGNOSIS — F524 Premature ejaculation: Secondary | ICD-10-CM

## 2020-07-12 DIAGNOSIS — N5201 Erectile dysfunction due to arterial insufficiency: Secondary | ICD-10-CM

## 2020-07-12 DIAGNOSIS — E291 Testicular hypofunction: Secondary | ICD-10-CM

## 2020-07-12 MED ORDER — TESTOSTERONE CYPIONATE 200 MG/ML IM SOLN
100.0000 mg | INTRAMUSCULAR | Status: DC
  Administered 2020-07-19 – 2020-09-13 (×8): 100 mg via INTRAMUSCULAR

## 2020-07-12 NOTE — Progress Notes (Signed)
Pt is given Testosterone injection for the weekly dose.  IM to left thigh 0.36mL. Pt tolerated well.

## 2020-07-13 ENCOUNTER — Encounter (HOSPITAL_COMMUNITY): Payer: Medicare Other

## 2020-07-18 ENCOUNTER — Encounter (HOSPITAL_COMMUNITY): Payer: Medicare Other

## 2020-07-19 ENCOUNTER — Other Ambulatory Visit: Payer: Self-pay

## 2020-07-19 ENCOUNTER — Ambulatory Visit (INDEPENDENT_AMBULATORY_CARE_PROVIDER_SITE_OTHER): Payer: Medicare Other

## 2020-07-19 VITALS — Temp 97.5°F | Ht 68.0 in | Wt 255.0 lb

## 2020-07-19 DIAGNOSIS — E291 Testicular hypofunction: Secondary | ICD-10-CM | POA: Diagnosis not present

## 2020-07-19 DIAGNOSIS — F524 Premature ejaculation: Secondary | ICD-10-CM

## 2020-07-19 DIAGNOSIS — N5201 Erectile dysfunction due to arterial insufficiency: Secondary | ICD-10-CM

## 2020-07-19 NOTE — Progress Notes (Signed)
Pt was given 0.73mL to the right thigh. Pt tolerated well; no complaints.

## 2020-07-20 ENCOUNTER — Encounter (HOSPITAL_COMMUNITY)
Admission: RE | Admit: 2020-07-20 | Discharge: 2020-07-20 | Disposition: A | Discharge status: Home / Self Care | Payer: Medicare Other | Source: Ambulatory Visit | Attending: Internal Medicine | Admitting: Internal Medicine

## 2020-07-20 DIAGNOSIS — I5032 Chronic diastolic (congestive) heart failure: Secondary | ICD-10-CM

## 2020-07-20 NOTE — Progress Notes (Signed)
Daily Session Note  Patient Details  Name: Ricardo Burch MRN: [id number] Date of Birth: 08-02-1961 Referring Provider:     PULMONARY REHAB OTHER RESP ORIENTATION from 02/14/2020 in [place]  Referring Provider Dr. [name]      Encounter Date: 07/20/2020  Check In:  Session Check In - 07/20/20 1045      Check-In   Supervising physician immediately available to respond to emergencies See telemetry face sheet for immediately available MD    Location [facility]    Staff Present [name], MS, ACSM-CEP, Exercise Physiologist;Ezzie Senat Wynetta Emery, RN, BSN    Virtual Visit No    Medication changes reported     No    Fall or balance concerns reported    No    Tobacco Cessation No Change    Warm-up and Cool-down Performed as group-led instruction    Resistance Training Performed Yes    VAD Patient? No    PAD/SET Patient? No      Pain Assessment   Currently in Pain? No/denies    Pain Score 0-No pain    Multiple Pain Sites No           Capillary Blood Glucose: No results found for this or any previous visit (from the past 24 hour(s)).    Social History   Tobacco Use  Smoking Status Former Smoker  . Packs/day: 1.00  . Years: 38.00  . Pack years: 38.00  . Types: Cigarettes  . Quit date: 02/21/2020  . Years since quitting: 0.4  Smokeless Tobacco Never Used    Goals Met:  Proper associated with RPD/PD & O2 Sat Independence with exercise equipment Improved SOB with ADL's Using PLB without cueing & demonstrates good technique Exercise tolerated well No report of cardiac concerns or symptoms Strength training completed today  Goals Unmet:  Not Applicable  Comments: Check out 1145.   Dr. Kathie Dike is Medical Director for [facility].

## 2020-07-24 ENCOUNTER — Other Ambulatory Visit (INDEPENDENT_AMBULATORY_CARE_PROVIDER_SITE_OTHER): Payer: Self-pay | Admitting: Nurse Practitioner

## 2020-07-25 ENCOUNTER — Other Ambulatory Visit: Payer: Self-pay

## 2020-07-25 ENCOUNTER — Encounter (HOSPITAL_COMMUNITY)
Admission: RE | Admit: 2020-07-25 | Discharge: 2020-07-25 | Disposition: A | Discharge status: Home / Self Care | Payer: Medicare Other | Source: Ambulatory Visit | Attending: Internal Medicine | Admitting: Internal Medicine

## 2020-07-25 ENCOUNTER — Other Ambulatory Visit: Payer: Medicare Other

## 2020-07-25 VITALS — Wt 258.4 lb

## 2020-07-25 DIAGNOSIS — I5032 Chronic diastolic (congestive) heart failure: Secondary | ICD-10-CM | POA: Diagnosis not present

## 2020-07-25 DIAGNOSIS — Z20822 Contact with and (suspected) exposure to covid-19: Secondary | ICD-10-CM | POA: Diagnosis not present

## 2020-07-25 DIAGNOSIS — J449 Chronic obstructive pulmonary disease, unspecified: Secondary | ICD-10-CM

## 2020-07-25 NOTE — Progress Notes (Signed)
Daily Session Note  Patient Details  Name: Ricardo Burch MRN: [id number] Date of Birth: February 07, 1961 Referring Provider:     PULMONARY REHAB OTHER RESP ORIENTATION from 02/14/2020 in [place]  Referring Provider Dr. [name]      Encounter Date: 07/25/2020  Check In:  Session Check In - 07/25/20 1045      Check-In   Supervising physician immediately available to respond to emergencies See telemetry face sheet for immediately available MD    Location [facility]    Staff Present Geanie Cooley, Felipe Drone, RN, [facility]    Virtual Visit No    Medication changes reported     No    Fall or balance concerns reported    No    Tobacco Cessation No Change    Warm-up and Cool-down Performed as group-led instruction    Resistance Training Performed Yes    VAD Patient? No    PAD/SET Patient? No      Pain Assessment   Currently in Pain? No/denies    Pain Score 0-No pain    Multiple Pain Sites No           Capillary Blood Glucose: No results found for this or any previous visit (from the past 24 hour(s)).    Social History   Tobacco Use  Smoking Status Former Smoker  . Packs/day: 1.00  . Years: 38.00  . Pack years: 38.00  . Types: Cigarettes  . Quit date: 02/21/2020  . Years since quitting: 0.4  Smokeless Tobacco Never Used    Goals Met:  Proper associated with RPD/PD & O2 Sat Independence with exercise equipment Improved SOB with ADL's Using PLB without cueing & demonstrates good technique Exercise tolerated well Personal goals reviewed No report of cardiac concerns or symptoms Strength training completed today  Goals Unmet:  Not Applicable  Comments: check out 11:45   Dr. Kathie Dike is Medical Director for [facility].

## 2020-07-26 ENCOUNTER — Ambulatory Visit (INDEPENDENT_AMBULATORY_CARE_PROVIDER_SITE_OTHER): Payer: Medicare Other

## 2020-07-26 ENCOUNTER — Other Ambulatory Visit: Payer: Self-pay

## 2020-07-26 VITALS — Temp 97.9°F | Resp 18 | Ht 67.0 in | Wt 256.0 lb

## 2020-07-26 DIAGNOSIS — N5201 Erectile dysfunction due to arterial insufficiency: Secondary | ICD-10-CM | POA: Diagnosis not present

## 2020-07-26 DIAGNOSIS — F524 Premature ejaculation: Secondary | ICD-10-CM | POA: Diagnosis not present

## 2020-07-26 DIAGNOSIS — E291 Testicular hypofunction: Secondary | ICD-10-CM | POA: Diagnosis not present

## 2020-07-26 NOTE — Progress Notes (Signed)
Pt given 0.35ml to left thigh. Pt tolerated well; no complaints.apply band-aid.

## 2020-07-27 ENCOUNTER — Encounter (HOSPITAL_COMMUNITY)
Admission: RE | Admit: 2020-07-27 | Discharge: 2020-07-27 | Disposition: A | Discharge status: Home / Self Care | Payer: Medicare Other | Source: Ambulatory Visit | Attending: Internal Medicine | Admitting: Internal Medicine

## 2020-07-27 DIAGNOSIS — I5032 Chronic diastolic (congestive) heart failure: Secondary | ICD-10-CM

## 2020-07-27 LAB — NOVEL CORONAVIRUS, NAA: SARS-CoV-2, NAA: NOT DETECTED

## 2020-07-27 LAB — SPECIMEN STATUS REPORT

## 2020-07-27 LAB — SARS-COV-2, NAA 2 DAY TAT

## 2020-07-27 NOTE — Progress Notes (Signed)
Daily Session Note  Patient Details  Name: Ricardo Burch MRN: [id number] Date of Birth: 1961/08/28 Referring Provider:     PULMONARY REHAB OTHER RESP ORIENTATION from 02/14/2020 in [place]  Referring Provider Dr. [name]      Encounter Date: 07/27/2020  Check In:  Session Check In - 07/27/20 1045      Check-In   Supervising physician immediately available to respond to emergencies See telemetry face sheet for immediately available MD    Location [facility]    Staff Present [name], MS, ACSM-CEP, Exercise Physiologist;Debra Wynetta Emery, RN, BSN    Virtual Visit No    Medication changes reported     No    Fall or balance concerns reported    No    Tobacco Cessation No Change    Current number of cigarettes/nicotine per day     0    Warm-up and Cool-down Performed as group-led instruction    Resistance Training Performed Yes    VAD Patient? No    PAD/SET Patient? No      Pain Assessment   Currently in Pain? No/denies    Pain Score 0-No pain    Multiple Pain Sites No           Capillary Blood Glucose: No results found for this or any previous visit (from the past 24 hour(s)).    Social History   Tobacco Use  Smoking Status Former Smoker  . Packs/day: 1.00  . Years: 38.00  . Pack years: 38.00  . Types: Cigarettes  . Quit date: 02/21/2020  . Years since quitting: 0.4  Smokeless Tobacco Never Used    Goals Met:  Independence with exercise equipment Exercise tolerated well No report of cardiac concerns or symptoms Strength training completed today  Goals Unmet:  Not Applicable  Comments: checkout time is  1145   Dr. Kathie Dike is Medical Director for [facility] Pulmonary Re[facility].

## 2020-07-27 NOTE — Progress Notes (Signed)
Discharge Progress Report  Patient Details  Name: Ricardo Burch MRN: [id number] Date of Birth: 11-30-1960 Referring Provider:     PULMONARY REHAB OTHER RESP ORIENTATION from 02/14/2020 in [facility]  Referring Provider Dr. [name]       Number of Visits: 34  Reason for Discharge:  Patient reached a stable level of exercise. Patient independent in their exercise. Patient has met program and personal goals.  Smoking History:  Social History   Tobacco Use  Smoking Status Former Smoker  . Packs/day: 1.00  . Years: 38.00  . Pack years: 38.00  . Types: Cigarettes  . Quit date: 02/21/2020  . Years since quitting: 0.4  Smokeless Tobacco Never Used    Diagnosis:  Chronic diastolic CHF (congestive heart failure) ([place])  ADL [facility]SD:  Pulmonary Assessment Scores    Row Name 02/14/20 1338 07/27/20 1226       ADL UCSD   ADL Phase Entry Exit    SOB Score total 57 48    Rest 0 --    Walk 7 --    Stairs 4 --    Bath 3 --    Dress 2 --    Shop 3 --      CAT Score   CAT Score 17 18      mMRC Score   mMRC Score 4 2           Initial Exercise Prescription:  Initial Exercise Prescription - 02/14/20 1200      Date of Initial Exercise RX and Referring Provider   Date 02/14/20    Referring Provider Dr. [name]    Expected Discharge Date 05/15/20      Oxygen   Oxygen Continuous    Liters 3      Treadmill   MPH 1.2    Grade 0    Minutes 17    METs 1.91      Recumbant Elliptical   Level 1    RPM 33    Watts 30    Minutes 22    METs 1.6      Prescription Details   Frequency (times per week) 2    Duration Progress to 30 minutes of continuous aerobic without signs/symptoms of physical distress      Intensity   THRR 40-80% of Max Heartrate [id number]    Ratings of Perceived Exertion 11-13    Perceived Dyspnea 0-4      Progression   Progression Continue to progress workloads to maintain intensity without signs/symptoms of physical  distress.      Resistance Training   Training Prescription Yes    Weight 1    Reps 10-15           Discharge Exercise Prescription (Final Exercise Prescription Changes):  Exercise Prescription Changes - 07/25/20 1327      Response to Exercise   Blood Pressure (Admit) 148/78    Blood Pressure (Exercise) 158/88    Blood Pressure (Exit) 132/76    Heart Rate (Admit) 86 bpm    Heart Rate (Exercise) 110 bpm    Heart Rate (Exit) 105 bpm    Oxygen Saturation (Admit) 94 %    Oxygen Saturation (Exercise) 87 %    Oxygen Saturation (Exit) 92 %    Rating of Perceived Exertion (Exercise) 10    Perceived Dyspnea (Exercise) 11    Duration Continue with 30 min of aerobic exercise without signs/symptoms of physical distress.    Intensity THRR unchanged  Progression   Progression Continue to progress workloads to maintain intensity without signs/symptoms of physical distress.      Resistance Training   Training Prescription Yes    Weight 4 lbs    Reps 10-15      Oxygen   Oxygen Continuous    Liters 3      Treadmill   MPH 2    Grade 0    Minutes 17    METs 2.53      Recumbant Elliptical   Level 2    RPM 61    Watts 77    Minutes 22    METs 4.3           Functional Capacity:  6 Minute Walk    Row Name 02/14/20 1247 07/27/20 1221       6 Minute Walk   Phase Initial Discharge    Distance 800 feet 1100 feet    Distance % Change -- 37.5 %    Distance Feet Change -- 300 ft    Walk Time 6 minutes 6 minutes    # of Rest Breaks 0 0    MPH 1.51 2.08    METS 2.16 2.76    RPE 12 10    Perceived Dyspnea  14 10    VO2 Peak 7.83 9.66    Symptoms Yes (comment) No    Comments SOB --    Resting HR 83 bpm 93 bpm    Resting BP 130/90 138/78    Resting Oxygen Saturation  96 % 95 %    Exercise Oxygen Saturation  during 6 min walk 89 % 88 %    Max Ex. HR 99 bpm 104 bpm    Max Ex. BP 148/92 150/82    2 Minute Post BP 120/88 144/84      Interval HR   1 Minute HR -- 94     2 Minute HR -- 93    3 Minute HR -- 96    4 Minute HR -- 93    5 Minute HR -- 104    6 Minute HR -- 100    2 Minute Post HR -- 89    Interval Heart Rate? -- Yes      Interval Oxygen   Interval Oxygen? -- Yes    Baseline Oxygen Saturation % -- 95 %    1 Minute Oxygen Saturation % -- 93 %    1 Minute Liters of Oxygen -- 4 L    2 Minute Oxygen Saturation % -- 90 %    2 Minute Liters of Oxygen -- 4 L    3 Minute Oxygen Saturation % -- 88 %    3 Minute Liters of Oxygen -- 4 L    4 Minute Oxygen Saturation % -- 88 %    4 Minute Liters of Oxygen -- 4 L    5 Minute Oxygen Saturation % -- 89 %    5 Minute Liters of Oxygen -- 4 L    6 Minute Oxygen Saturation % -- 88 %    6 Minute Liters of Oxygen -- 4 L    2 Minute Post Oxygen Saturation % -- 95 %    2 Minute Post Liters of Oxygen -- 4 L           Psychological, QOL, Others - Outcomes: PHQ 2/9: Depression screen [employer] [facility] 2/9 07/27/2020 02/14/2020 01/12/2020 05/26/2018 04/29/2018  Decreased Interest 1 0 0 0 0  Down, Depressed, Hopeless 1 1 0  0 0  PHQ - 2 Score 2 1 0 0 0  Altered sleeping 0 1 - - 0  Tired, decreased energy 0 1 - - -  Change in appetite 1 0 - - 0  Feeling bad or failure about yourself  0 1 - - 1  Trouble concentrating 0 1 - - 0  Moving slowly or fidgety/restless 0 0 - - 0  Suicidal thoughts 0 0 - - 0  PHQ-9 Score 3 5 - - 1  Difficult doing work/chores - Somewhat difficult - - -  Some recent data might be hidden    Quality of Life:  Quality of Life - 02/14/20 1312      Quality of Life   Select Quality of Life      Quality of Life Scores   Health/Function Pre 20.81 %    Socioeconomic Pre 20.5 %    Psych/Spiritual Pre 21 %    Family Pre 20.4 %    GLOBAL Pre 20.72 %           Personal Goals: Goals established at orientation with interventions provided to work toward goal.    Personal Goals Discharge:  Goals and Risk Factor Review    Row Name 02/24/20 1610 03/17/20 1459 04/12/20 1430 05/11/20 0805 06/07/20  1449     Core Components/Risk Factors/Patient Goals Review   Personal Goals Review Weight Management/Obesity;Improve shortness of breath with ADL's  Breathe better; do more activities; go on trips w/o O2. Weight Management/Obesity;Improve shortness of breath with ADL's  Breathe better; do more activities; go on trips without using oxygen. Weight Management/Obesity;Improve shortness of breath with ADL's  Breathe better; do more activities; go on trips without O2. Weight Management/Obesity;Improve shortness of breath with ADL's  Breathe better; do more activities; go on trips without oxygen. Weight Management/Obesity;Improve shortness of breath with ADL's  Breathe better; do more activities; go on trips w/o O2.   Review Patient has completed 3 sessions maintaining his weight since he started the program. He is doing well so far and hopes to meet his goals as he continues. Will continue to monitor for progress. Patient has completed 7 sessions losing 2 lbs since last 30 day review. He continues to do well in the program with progression. He has missed some sessions recently due to low back pain per patient. He is being followed by his pcp who reports right lower quadrand abdominal pain and right loin pain. He is ordering a Renal CT for next week. Hopefully, he will be able to return to the program. Will continue to monitor for progress. Patient has completed 13 sessions gaining 2 lbs since last 30 day review. He continues to do well in the program with progression. He reports his breathing has improved. He also reports having more energy and feeling better overall. He says he is doing more around the house. He is pleaased with his progress in the program. Will continue to monitor for progress. Patient has completed 17 sessions gaining 3 lbs since last 30 day review. He continue to do well in the program with progression. He continues to attempt to go without his oxygen more and more but his O2 sats drops at times.  He says he feels his breathing continues to improve and is feeling better overall. He is doing more activities around the house. Will continue to monitor for progress. Patient has completed 25 sessions gaining 3 lbs since last 30 day review. He continues to do well in the program with  progression and presistant attendance. He continues to go without O2 more and more. He only uses at home with strenous activities and at night with CPAP with decreasing episodes of O2 saturation dropping. He feels like his breathing continues to improve and he feels better and stronger overall. Will continue to monitor for progress.   Expected Outcomes Patient will continue to attend sessions and complete the program meeting both his personal and program goals. Patient will continue to attend sessions and complete the program meeting both his personal and program goals. Patient will continue to attend sessions and complete the program meeting both his personal and program goals. Patient will continue to attend sessions and complete the program meeting both his personal and program goals. Patient will continue to attend sessions and complete the program meeting both his personal and program goals.   [place] Name 07/05/20 1451 07/27/20 1549           Core Components/Risk Factors/Patient Goals Review   Personal Goals Review Weight Management/Obesity;Improve shortness of breath with ADL's  Breathe better; do more activities/go on trips w/o O2. Weight Management/Obesity;Improve shortness of breath with ADL's  Breathe better; do more activities; go on trips without using oxygen.      Review Patient has completed 29 sessions losing 4 lbs since last 30 day review. He continues to do well in the program with progression. His attendance has been somewhat consistent. He says he is still using O2 at home less and less. He does use when he is vaccuming and doing other strenous activities. He says he is compliant with his medications and oxygen  therapy. He says he continues to gain strength and energy and plans to join maintenance when he finishes the program. Will continue to montior for progress. Patient completed the program with 34 sessions gaining 4 lbs overall. He did well in the program. His exit measurments improved in grip strength. His exit pulmonary assessment scores also improved. His exit walk test imroved by 37.5%. He reports breathing better and using less and less oxygen at home. He says his strength and stamina have improved with increased energy. He is looking forward to traveling and getting back to doing the things he likes. He says he feels more confident about exercising on his own now. He plans to join our forever fit maintenance program to continue exercising. PR will F/U.      Expected Outcomes Patient will continue to attend sessions and complete the program meeting both his personal and program goals. Patient will continue to exercise and continue to meet his personal goals.             Exercise Goals and Review:  Exercise Goals    Row Name 02/14/20 1302 06/07/20 1401 07/05/20 0852         Exercise Goals   Increase Physical Activity Yes Yes Yes     Intervention Provide advice, education, support and counseling about physical activity/exercise needs.;Develop an individualized exercise prescription for aerobic and resistive training based on initial evaluation findings, risk stratification, comorbidities and participant's personal goals. Provide advice, education, support and counseling about physical activity/exercise needs.;Develop an individualized exercise prescription for aerobic and resistive training based on initial evaluation findings, risk stratification, comorbidities and participant's personal goals. Provide advice, education, support and counseling about physical activity/exercise needs.;Develop an individualized exercise prescription for aerobic and resistive training based on initial evaluation  findings, risk stratification, comorbidities and participant's personal goals.     Expected Outcomes Short Term: Attend rehab on  a regular basis to increase amount of physical activity.;Long Term: Add in home exercise to make exercise part of routine and to increase amount of physical activity. Short Term: Attend rehab on a regular basis to increase amount of physical activity.;Long Term: Add in home exercise to make exercise part of routine and to increase amount of physical activity.;Long Term: Exercising regularly at least 3-5 days a week. Short Term: Attend rehab on a regular basis to increase amount of physical activity.;Long Term: Add in home exercise to make exercise part of routine and to increase amount of physical activity.;Long Term: Exercising regularly at least 3-5 days a week.     Increase Strength and Stamina Yes Yes Yes     Intervention Provide advice, education, support and counseling about physical activity/exercise needs.;Develop an individualized exercise prescription for aerobic and resistive training based on initial evaluation findings, risk stratification, comorbidities and participant's personal goals. Provide advice, education, support and counseling about physical activity/exercise needs.;Develop an individualized exercise prescription for aerobic and resistive training based on initial evaluation findings, risk stratification, comorbidities and participant's personal goals. Provide advice, education, support and counseling about physical activity/exercise needs.;Develop an individualized exercise prescription for aerobic and resistive training based on initial evaluation findings, risk stratification, comorbidities and participant's personal goals.     Expected Outcomes Short Term: Perform resistance training exercises routinely during rehab and add in resistance training at home;Long Term: Improve cardiorespiratory fitness, muscular endurance and strength as measured by increased  METs and functional capacity (6MWT) Short Term: Increase workloads from initial exercise prescription for resistance, speed, and METs.;Short Term: Perform resistance training exercises routinely during rehab and add in resistance training at home;Long Term: Improve cardiorespiratory fitness, muscular endurance and strength as measured by increased METs and functional capacity (6MWT) Short Term: Increase workloads from initial exercise prescription for resistance, speed, and METs.;Short Term: Perform resistance training exercises routinely during rehab and add in resistance training at home;Long Term: Improve cardiorespiratory fitness, muscular endurance and strength as measured by increased METs and functional capacity (6MWT)     Able to understand and use rate of perceived exertion (RPE) scale Yes Yes Yes     Intervention Provide education and explanation on how to use RPE scale Provide education and explanation on how to use RPE scale Provide education and explanation on how to use RPE scale     Expected Outcomes Short Term: Able to use RPE daily in rehab to express subjective intensity level;Long Term:  Able to use RPE to guide intensity level when exercising independently Short Term: Able to use RPE daily in rehab to express subjective intensity level;Long Term:  Able to use RPE to guide intensity level when exercising independently Short Term: Able to use RPE daily in rehab to express subjective intensity level;Long Term:  Able to use RPE to guide intensity level when exercising independently     Able to understand and use Dyspnea scale Yes Yes Yes     Intervention Provide education and explanation on how to use Dyspnea scale Provide education and explanation on how to use Dyspnea scale Provide education and explanation on how to use Dyspnea scale     Expected Outcomes Short Term: Able to use Dyspnea scale daily in rehab to express subjective sense of shortness of breath during exertion;Long Term: Able to  use Dyspnea scale to guide intensity level when exercising independently Short Term: Able to use Dyspnea scale daily in rehab to express subjective sense of shortness of breath during exertion;Long Term: Able to  use Dyspnea scale to guide intensity level when exercising independently Short Term: Able to use Dyspnea scale daily in rehab to express subjective sense of shortness of breath during exertion;Long Term: Able to use Dyspnea scale to guide intensity level when exercising independently     Knowledge and understanding of Target Heart Rate Range (THRR) Yes -- Yes     Intervention Provide education and explanation of THRR including how the numbers were predicted and where they are located for reference Provide education and explanation of THRR including how the numbers were predicted and where they are located for reference Provide education and explanation of THRR including how the numbers were predicted and where they are located for reference     Expected Outcomes Short Term: Able to use daily as guideline for intensity in rehab;Long Term: Able to use THRR to govern intensity when exercising independently Short Term: Able to state/look up THRR;Long Term: Able to use THRR to govern intensity when exercising independently Short Term: Able to state/look up THRR;Long Term: Able to use THRR to govern intensity when exercising independently     Able to check pulse independently Yes -- --     Intervention Provide education and demonstration on how to check pulse in carotid and radial arteries.;Review the importance of being able to check your own pulse for safety during independent exercise -- --     Expected Outcomes Short Term: Able to explain why pulse checking is important during independent exercise;Long Term: Able to check pulse independently and accurately -- --     Understanding of Exercise Prescription Yes Yes Yes     Intervention Provide education, explanation, and written materials on patient's  individual exercise prescription Provide education, explanation, and written materials on patient's individual exercise prescription Provide education, explanation, and written materials on patient's individual exercise prescription     Expected Outcomes Short Term: Able to explain program exercise prescription;Long Term: Able to explain home exercise prescription to exercise independently Short Term: Able to explain program exercise prescription;Long Term: Able to explain home exercise prescription to exercise independently Short Term: Able to explain program exercise prescription;Long Term: Able to explain home exercise prescription to exercise independently            Exercise Goals Re-Evaluation:  Exercise Goals Re-Evaluation    Row Name 03/20/20 1544 04/03/20 1426 05/05/20 1553 06/07/20 1402 07/05/20 0852     Exercise Goal Re-Evaluation   Exercise Goals Review Increase Physical Activity;Increase Strength and Stamina Increase Physical Activity;Increase Strength and Stamina Increase Physical Activity;Increase Strength and Stamina Increase Physical Activity;Increase Strength and Stamina;Able to understand and use rate of perceived exertion (RPE) scale;Able to understand and use Dyspnea scale;Knowledge and understanding of Target Heart Rate Range (THRR);Understanding of Exercise Prescription Increase Physical Activity;Increase Strength and Stamina;Able to understand and use rate of perceived exertion (RPE) scale;Able to understand and use Dyspnea scale;Knowledge and understanding of Target Heart Rate Range (THRR);Understanding of Exercise Prescription   Comments Patient wants to breathe better and to be able to go on trips without oxygen. Karlon's goals are to breathe better and to be able to go on trips w/o oxygen. Denarius is doing really well. He is able to tolerate his progressions w/o any any abnormal S/S. He is still not able to go without his oxygen for long periods. He workloads are increasing with  each session. We will continue to monitor his progress. He is breathing alot better. He is progressing at a steady pace. He shows great effort toward reducing his  use of continuous oxgen. We will continue to progress as tolerated Patient said that he is breathing alot better. He is not using as much oxygen as before. He only uses it when he is going on long trips or doing vigorous activity. He is currently motivated to lose weight and change his diet. He is tolerating increased workloads well. We will continue to progress as tolerated. Pt has attended 28 exercise sessions. His attendance has been infrequent since he has been in the program. He currently exercises at 3.7 METs on the seated elliptical. Will continue to monitor and progress as able.   Expected Outcomes To reach expected goals To reach his expected goals. To reach expected goals To reach expected goals Through exercise at rehab and by engaging in a home exercise program, the pt will reach their goals.          Nutrition & Weight - Outcomes:  Pre Biometrics - 02/14/20 1310      Pre Biometrics   Height 5' 7"  (1.702 m)    Weight 113.2 kg    Waist Circumference 45 inches    Hip Circumference 41.5 inches    Waist to Hip Ratio 1.08 %    BMI (Calculated) 39.08    Triceps Skinfold 6 mm    % Body Fat 30.3 %    Grip Strength 50.9 kg    Flexibility 0 in    Single Leg Stand 4.73 seconds            Nutrition:  Nutrition Therapy & Goals - 07/05/20 1451      Personal Nutrition Goals   Comments Patient continues to say he is eating more salads and cold cuts eating one big meal/day and supplementing with snacks. Will continue to montior.      Intervention Plan   Intervention Nutrition handout(s) given to patient.           Nutrition Discharge:  Nutrition Assessments - 07/27/20 1559      MEDFICTS Scores   Pre Score 59    Post Score 50    Score Difference -9           Education Questionnaire Score:  Knowledge  Questionnaire Score - 07/27/20 1227      Knowledge Questionnaire Score   Post Score 16/18         Patient graduated from Pulmonary Rehabilitation today on 07/27/20 after completing 34 sessions. He achieved LTG of 30 minutes of aerobic exercise at Max Met level of 3.1. All patients vitals are WNL.  Discharge instruction has been reviewed in detail and patient stated an understanding of material given. Patient plans to join our [employer] program for continued exercise. Cardiac Rehab staff will make f/u calls at 1 month, 6 months, and 1 year. Patient had no complaints of any abnormal S/S or pain on their exit visit.   Goals reviewed with patient; copy given to patient.

## 2020-07-28 ENCOUNTER — Ambulatory Visit (INDEPENDENT_AMBULATORY_CARE_PROVIDER_SITE_OTHER): Payer: Medicare Other | Admitting: Gastroenterology

## 2020-07-28 ENCOUNTER — Encounter: Payer: Self-pay | Admitting: Gastroenterology

## 2020-07-28 ENCOUNTER — Other Ambulatory Visit: Payer: Self-pay

## 2020-07-28 VITALS — BP 123/72 | HR 85 | Temp 97.5°F | Ht 68.0 in | Wt 257.2 lb

## 2020-07-28 DIAGNOSIS — K219 Gastro-esophageal reflux disease without esophagitis: Secondary | ICD-10-CM | POA: Diagnosis not present

## 2020-07-28 NOTE — Patient Instructions (Signed)
1. Continue pantoprazole 40mg  daily to prevent reflux symptoms. At some point, you can try weaning off medication but if you have recurrent symptoms at least three times weekly, I would suggest you continue taking medication daily to prevent reflux symptoms.  2. Return to the office in one year or call us sooner if you have any questions or concerns.

## 2020-07-28 NOTE — Progress Notes (Signed)
Primary Care Physician: Doree Albee, MD  Primary Gastroenterologist:  Garfield Cornea, MD   Chief Complaint  Patient presents with  . Gastroesophageal Reflux    doing okay now. no bloating    HPI: Ricardo Burch is a 59 y.o. male here for follow-up.  Last seen in March.  History of GERD and bloating.  Patient has had reflux symptoms off and on for several years.  Never really take any medication on a regular basis.  Started on pantoprazole 40 mg daily at last office visit.  Doing very well.  Denies any reflux symptoms, heartburn.  He is trying to watch spicy foods and overeating.  Seems to be doing a lot better.  No bloating or abdominal pain.  Bowel movements are unremarkable.  Denies any blood in the stool or melena.  Colonoscopy was completed 06/25/2017 which found diverticulosis in the sigmoid and descending colon, two 4 to 6 mm polyps in the descending colon ascending colon, otherwise normal.  Surgical pathology found the polyps to be tubular adenoma.  Recommended repeat colonoscopy in 5 years (2023).   Current Outpatient Medications  Medication Sig Dispense Refill  . albuterol (VENTOLIN HFA) 108 (90 Base) MCG/ACT inhaler Inhale 1 or 2 puffs by mouth every 6 hours as needed for wheezing or shortness of breath 18 g 4  . amLODipine (NORVASC) 5 MG tablet TAKE 1 TABLET BY MOUTH DAILY 90 tablet 0  . Budeson-Glycopyrrol-Formoterol (BREZTRI AEROSPHERE) 160-9-4.8 MCG/ACT AERO Inhale 2 puffs into the lungs 2 (two) times daily. 10.7 g 5  . Cholecalciferol (VITAMIN D-3) 125 MCG (5000 UT) TABS Take 2 tablets by mouth daily.    . fluticasone (FLONASE) 50 MCG/ACT nasal spray INSTILL 2 SPRAYS IN EACH NOSTRIL TWICE DAILY 16 mL 5  . ipratropium-albuterol (DUONEB) 0.5-2.5 (3) MG/3ML SOLN Take 3 mLs by nebulization every 4 (four) hours as needed. 360 mL 11  . meloxicam (MOBIC) 7.5 MG tablet Take 1 tablet (7.5 mg total) by mouth daily. 30 tablet 5  . OXYGEN Inhale 3 L into the lungs daily.  continuous    . pantoprazole (PROTONIX) 40 MG tablet Take 1 tablet (40 mg total) by mouth daily. 90 tablet 3  . roflumilast (DALIRESP) 500 MCG TABS tablet Take 1 tablet (500 mcg total) by mouth daily. 30 tablet 5  . tadalafil (CIALIS) 20 MG tablet Take 0.5 tablets (10 mg total) by mouth daily as needed for erectile dysfunction. 10 tablet 2  . testosterone cypionate (DEPOTESTOSTERONE CYPIONATE) 200 MG/ML injection Inject 0.6 mLs (120 mg total) into the muscle once a week. 0.68ml/week 10 mL 1   Current Facility-Administered Medications  Medication Dose Route Frequency Provider Last Rate Last Admin  . testosterone cypionate (DEPOTESTOSTERONE CYPIONATE) injection 100 mg  100 mg Intramuscular Q7 days Anastasio Champion, Nimish C, MD   100 mg at 07/26/20 1024    Allergies as of 07/28/2020 - Review Complete 07/28/2020  Allergen Reaction Noted  . Apresoline [hydralazine] Other (See Comments) 07/10/2016    ROS:  General: Negative for anorexia, weight loss, fever, chills, fatigue, weakness. ENT: Negative for hoarseness, difficulty swallowing , nasal congestion. CV: Negative for chest pain, angina, palpitations, dyspnea on exertion, peripheral edema.  Respiratory: Negative for dyspnea at rest, positive dyspnea on exertion, cough, sputum, wheezing.  Wears oxygen GI: See history of present illness. GU:  Negative for dysuria, hematuria, urinary incontinence, urinary frequency, nocturnal urination.  Endo: Negative for unusual weight change.    Physical Examination:   BP 123/72  Pulse 85   Temp (!) 97.5 F (36.4 C) (Oral)   Ht 5\' 8"  (1.727 m)   Wt 257 lb 3.2 oz (116.7 kg)   BMI 39.11 kg/m   General: Pleasant male, appears older than stated age.  Nasal cannula in place.  No acute distress. Eyes: No icterus. Mouth: masked Lungs: Clear to auscultation bilaterally.  Heart: Regular rate and rhythm, no murmurs rubs or gallops.  Abdomen: Bowel sounds are normal, nontender, nondistended, no hepatosplenomegaly  or masses, no abdominal bruits or hernia , no rebound or guarding.   Extremities: No lower extremity edema. No clubbing or deformities. Neuro: Alert and oriented x 4   Skin: Warm and dry, no jaundice.   Psych: Alert and cooperative, normal mood and affect.  Impression/plan:   59 year old gentleman presenting for follow-up of reflux and bloating.  Has been on pantoprazole 40 mg daily since March.  Has noticed significant improvement in symptoms.  No longer having any issues.  He is trying to watch spicy food intake, decrease use of hot sauce, avoid overeating.  In the future if he decides he wants to try coming off of PPI therapy, would recommend starting with every other day dosing and see how he does.  If he is able to wean off without any significant recurrent symptoms he can discontinue.  However advised that if he is having reflux type symptoms more than 3 times weekly, would consider daily pantoprazole to prevent reflux symptoms.  Patient voiced understanding.  We will have him come back in 1 year or call sooner if needed.

## 2020-07-31 NOTE — Progress Notes (Signed)
Cc'ed to pcp °

## 2020-08-02 ENCOUNTER — Other Ambulatory Visit: Payer: Self-pay

## 2020-08-02 ENCOUNTER — Ambulatory Visit (INDEPENDENT_AMBULATORY_CARE_PROVIDER_SITE_OTHER): Payer: Medicare Other

## 2020-08-02 VITALS — Resp 18 | Ht 68.0 in | Wt 257.0 lb

## 2020-08-02 DIAGNOSIS — F524 Premature ejaculation: Secondary | ICD-10-CM

## 2020-08-02 DIAGNOSIS — E291 Testicular hypofunction: Secondary | ICD-10-CM | POA: Diagnosis not present

## 2020-08-02 DIAGNOSIS — N5201 Erectile dysfunction due to arterial insufficiency: Secondary | ICD-10-CM | POA: Diagnosis not present

## 2020-08-02 NOTE — Progress Notes (Signed)
Pt was given 0.51mL rt thigh. Pt tolerated well; no complaints.

## 2020-08-04 DIAGNOSIS — J438 Other emphysema: Secondary | ICD-10-CM | POA: Diagnosis not present

## 2020-08-04 DIAGNOSIS — J449 Chronic obstructive pulmonary disease, unspecified: Secondary | ICD-10-CM | POA: Diagnosis not present

## 2020-08-05 DIAGNOSIS — J449 Chronic obstructive pulmonary disease, unspecified: Secondary | ICD-10-CM | POA: Diagnosis not present

## 2020-08-09 ENCOUNTER — Ambulatory Visit (INDEPENDENT_AMBULATORY_CARE_PROVIDER_SITE_OTHER): Payer: Medicare Other

## 2020-08-09 ENCOUNTER — Other Ambulatory Visit: Payer: Self-pay

## 2020-08-09 DIAGNOSIS — F524 Premature ejaculation: Secondary | ICD-10-CM

## 2020-08-09 DIAGNOSIS — E291 Testicular hypofunction: Secondary | ICD-10-CM

## 2020-08-09 DIAGNOSIS — Z23 Encounter for immunization: Secondary | ICD-10-CM

## 2020-08-09 NOTE — Progress Notes (Signed)
Pt given reg dose flu vaccine.to left arm. Pt tolerated well;applied band-aid.

## 2020-08-16 ENCOUNTER — Ambulatory Visit (INDEPENDENT_AMBULATORY_CARE_PROVIDER_SITE_OTHER): Payer: Medicare Other

## 2020-08-16 ENCOUNTER — Other Ambulatory Visit: Payer: Self-pay

## 2020-08-16 DIAGNOSIS — N5201 Erectile dysfunction due to arterial insufficiency: Secondary | ICD-10-CM | POA: Diagnosis not present

## 2020-08-16 DIAGNOSIS — F524 Premature ejaculation: Secondary | ICD-10-CM

## 2020-08-16 DIAGNOSIS — E291 Testicular hypofunction: Secondary | ICD-10-CM | POA: Diagnosis not present

## 2020-08-16 NOTE — Progress Notes (Signed)
Pt was given Testosterone 0.54mL IM to the left thigh. Pt tolerated well;no comp[laints. Applied band-aid.

## 2020-08-21 ENCOUNTER — Other Ambulatory Visit: Payer: Self-pay

## 2020-08-21 ENCOUNTER — Other Ambulatory Visit: Payer: Self-pay | Admitting: Pulmonary Disease

## 2020-08-21 ENCOUNTER — Ambulatory Visit (HOSPITAL_COMMUNITY)
Admission: RE | Admit: 2020-08-21 | Discharge: 2020-08-21 | Disposition: A | Discharge status: Home / Self Care | Payer: Medicare Other | Source: Ambulatory Visit | Attending: Acute Care | Admitting: Acute Care

## 2020-08-21 ENCOUNTER — Other Ambulatory Visit (INDEPENDENT_AMBULATORY_CARE_PROVIDER_SITE_OTHER): Payer: Self-pay | Admitting: Nurse Practitioner

## 2020-08-21 DIAGNOSIS — Z87891 Personal history of nicotine dependence: Secondary | ICD-10-CM | POA: Diagnosis not present

## 2020-08-21 DIAGNOSIS — Z122 Encounter for screening for malignant neoplasm of respiratory organs: Secondary | ICD-10-CM | POA: Insufficient documentation

## 2020-08-21 DIAGNOSIS — F17210 Nicotine dependence, cigarettes, uncomplicated: Secondary | ICD-10-CM | POA: Insufficient documentation

## 2020-08-21 DIAGNOSIS — J438 Other emphysema: Secondary | ICD-10-CM

## 2020-08-23 ENCOUNTER — Ambulatory Visit (INDEPENDENT_AMBULATORY_CARE_PROVIDER_SITE_OTHER): Payer: Medicare Other

## 2020-08-23 ENCOUNTER — Other Ambulatory Visit: Payer: Self-pay

## 2020-08-23 DIAGNOSIS — N5201 Erectile dysfunction due to arterial insufficiency: Secondary | ICD-10-CM

## 2020-08-23 DIAGNOSIS — E291 Testicular hypofunction: Secondary | ICD-10-CM

## 2020-08-23 DIAGNOSIS — F524 Premature ejaculation: Secondary | ICD-10-CM

## 2020-08-23 NOTE — Progress Notes (Signed)
Pt was given 0.75m to ret thigh. Pt tolerated well; no complaints.

## 2020-08-24 NOTE — Progress Notes (Signed)
Please call patient and let them  know their  low dose Ct was read as a Lung RADS 2: nodules that are benign in appearance and behavior with a very low likelihood of becoming a clinically active cancer due to size or lack of growth. Recommendation per radiology is for a repeat LDCT in 12 months. .Please let them  know we will order and schedule their  annual screening scan for 08/2021. Please let them  know there was notation of CAD on their  scan.  Please remind the patient  that this is a non-gated exam therefore degree or severity of disease  cannot be determined. Please have them  follow up with their PCP regarding potential risk factor modification, dietary therapy or pharmacologic therapy if clinically indicated. Pt.  is not  currently on statin therapy. Please place order for annual  screening scan for  08/2021 and fax results to PCP. Thanks so much.  Langley Gauss, this patient is followed by Dr. Vaughan Browner , who is aware of is pulmonary hypertension, and per his last note is monitoring it. Thanks

## 2020-08-28 ENCOUNTER — Other Ambulatory Visit: Payer: Self-pay | Admitting: *Deleted

## 2020-08-28 ENCOUNTER — Telehealth: Payer: Self-pay | Admitting: Pulmonary Disease

## 2020-08-28 DIAGNOSIS — Z87891 Personal history of nicotine dependence: Secondary | ICD-10-CM

## 2020-08-28 NOTE — Telephone Encounter (Signed)
Primary Pulmonologist: Mannam Last office visit and with whom: 12/06/19 What do we see them for (pulmonary problems): COPD Last OV assessment/plan:  Assessment:  Severe COPD Continues on Daliresp.  He feels that the Anoro is not working as well for him anymore We will try alternate inhaler therapy with breztri Continue supplemental oxygen Work on weight loss with diet and exercise. He is finished pulmonary rehab in the past.  We can refer him again once Covid restrictions are removed.  Severe OSA Stable on CPAP.  Continue with current setting of 12. Encouraged him to use it every day.  Tobacco use Congratulated on quitting smoking. Continue low-dose screening CTs   Pulmonary hypertension Likely group 3 pulmonary hypertension secondary to COPD, OSA.  Continue to monitor.  Health maintenance 09/15/2017- Prevnar 13 06/14/2019-Pneumovax  This appointment required 40 minutes of patient care (this includes precharting, chart review, review of results, face-to-face care, etc.). Plan/Recommendations: - Stop Anoro.  Start Daliresp - Continue Daliresp, supplemental oxygen - Continue CPAP - Screening CT of the chest  Marshell Garfinkel MD [facility] 12/06/2019, 10:17 AM  CC: Doree Albee, MD     Patient Instructions by Marshell Garfinkel, MD at 12/06/2019 10:00 AM Author: Marshell Garfinkel, MD Author Type: Physician Filed: 12/06/2019 10:31 AM  Note Status: Signed Cosign: Cosign Not Required Encounter Date: 12/06/2019  Editor: Marshell Garfinkel, MD (Physician)                 We will stop the Anoro and start you on a different medication called breztri as you still have significant dyspnea We will also give you nebulizer and duo nebs to be used up to 4 times a day as needed Congrats on quitting smoking Continue using the CPAP  Follow-up in 3 months.      Was appointment offered to patient (explain)?  Pt wants med recommendations   Reason for call:  Called and spoke with pt who stated he has been sneezing, has a runny nose, and congestion. Pt states the drainage is clear. Pt denies any complaints of fever as I had him check temp while on phone with him which was 98.7.  Pt denies any compaints of cough or sore throat. All of pt's symptoms are in his head with runny nose, head congestion (common cold symptoms).  Before his symptoms started to get worse and started going into his chest, pt wanted to know if there was anything he could take to help with his symptoms (either OTC meds or prescription meds).  Dr. Vaughan Browner, please advise.  Allergies  Allergen Reactions  . Apresoline [Hydralazine] Other (See Comments)    Headache     Immunization History  Administered Date(s) Administered  . Influenza Inj Mdck Quad With Preservative 08/09/2020  . Influenza, High Dose Seasonal PF 06/29/2018  . Influenza,inj,Quad PF,6+ Mos 09/15/2017, 08/25/2019, 08/25/2019  . Moderna SARS-COVID-2 Vaccination 01/24/2020, 02/21/2020  . Pneumococcal Conjugate-13 09/15/2017  . Pneumococcal Polysaccharide-23 06/15/2019  . Tdap 05/26/2015

## 2020-08-28 NOTE — Telephone Encounter (Signed)
Called and spoke with pt letting him know the info stated by Dr. Mannam and he verbalized understanding. Nothing further needed. 

## 2020-08-28 NOTE — Telephone Encounter (Signed)
Please try over-the-counter Nasonex for nasal discharge Mucinex and Delsym for chest congestion

## 2020-08-30 ENCOUNTER — Ambulatory Visit (INDEPENDENT_AMBULATORY_CARE_PROVIDER_SITE_OTHER): Payer: Medicare Other

## 2020-08-30 ENCOUNTER — Other Ambulatory Visit: Payer: Self-pay

## 2020-08-30 ENCOUNTER — Encounter (INDEPENDENT_AMBULATORY_CARE_PROVIDER_SITE_OTHER): Payer: Self-pay

## 2020-08-30 VITALS — Resp 18 | Ht 68.0 in | Wt 252.0 lb

## 2020-08-30 DIAGNOSIS — F524 Premature ejaculation: Secondary | ICD-10-CM

## 2020-08-30 DIAGNOSIS — E291 Testicular hypofunction: Secondary | ICD-10-CM

## 2020-08-30 DIAGNOSIS — N5201 Erectile dysfunction due to arterial insufficiency: Secondary | ICD-10-CM | POA: Diagnosis not present

## 2020-08-30 NOTE — Progress Notes (Signed)
Pt was given Testosterone  0.5IM to the lft thigh. Pt tolerated well; no complaints.

## 2020-09-04 DIAGNOSIS — J449 Chronic obstructive pulmonary disease, unspecified: Secondary | ICD-10-CM | POA: Diagnosis not present

## 2020-09-04 DIAGNOSIS — J438 Other emphysema: Secondary | ICD-10-CM | POA: Diagnosis not present

## 2020-09-05 DIAGNOSIS — J449 Chronic obstructive pulmonary disease, unspecified: Secondary | ICD-10-CM | POA: Diagnosis not present

## 2020-09-06 ENCOUNTER — Ambulatory Visit (INDEPENDENT_AMBULATORY_CARE_PROVIDER_SITE_OTHER): Payer: Medicare Other

## 2020-09-06 ENCOUNTER — Other Ambulatory Visit: Payer: Self-pay

## 2020-09-06 DIAGNOSIS — F524 Premature ejaculation: Secondary | ICD-10-CM | POA: Diagnosis not present

## 2020-09-06 DIAGNOSIS — N5201 Erectile dysfunction due to arterial insufficiency: Secondary | ICD-10-CM | POA: Diagnosis not present

## 2020-09-06 DIAGNOSIS — E291 Testicular hypofunction: Secondary | ICD-10-CM | POA: Diagnosis not present

## 2020-09-06 NOTE — Progress Notes (Signed)
Pt was given 0.30ml to the rt thigh. Pt tolerated well; no complaints today. Stated he is feeling great . He does feel it wearing down the day before the next injection to note in the cart.

## 2020-09-13 ENCOUNTER — Other Ambulatory Visit: Payer: Self-pay

## 2020-09-13 ENCOUNTER — Ambulatory Visit (INDEPENDENT_AMBULATORY_CARE_PROVIDER_SITE_OTHER): Payer: Medicare Other

## 2020-09-13 ENCOUNTER — Encounter (INDEPENDENT_AMBULATORY_CARE_PROVIDER_SITE_OTHER): Payer: Self-pay

## 2020-09-13 VITALS — BP 132/80 | HR 78 | Temp 97.2°F | Resp 18 | Ht 67.0 in | Wt 256.0 lb

## 2020-09-13 DIAGNOSIS — N5201 Erectile dysfunction due to arterial insufficiency: Secondary | ICD-10-CM

## 2020-09-13 DIAGNOSIS — F524 Premature ejaculation: Secondary | ICD-10-CM

## 2020-09-13 DIAGNOSIS — E291 Testicular hypofunction: Secondary | ICD-10-CM | POA: Diagnosis not present

## 2020-09-13 NOTE — Progress Notes (Signed)
Pt was given 0.5ML IM to the Rt Thigh. Pt tolerated well; no complaints.  Pt stated her just got up later this morning today. Was tired a bit this morning.

## 2020-09-18 DIAGNOSIS — G4733 Obstructive sleep apnea (adult) (pediatric): Secondary | ICD-10-CM | POA: Diagnosis not present

## 2020-09-18 DIAGNOSIS — J449 Chronic obstructive pulmonary disease, unspecified: Secondary | ICD-10-CM | POA: Diagnosis not present

## 2020-09-20 ENCOUNTER — Encounter (INDEPENDENT_AMBULATORY_CARE_PROVIDER_SITE_OTHER): Payer: Self-pay

## 2020-09-20 ENCOUNTER — Other Ambulatory Visit: Payer: Self-pay

## 2020-09-20 ENCOUNTER — Ambulatory Visit (INDEPENDENT_AMBULATORY_CARE_PROVIDER_SITE_OTHER): Payer: Medicare Other

## 2020-09-27 ENCOUNTER — Other Ambulatory Visit: Payer: Self-pay

## 2020-09-27 ENCOUNTER — Encounter: Payer: Self-pay | Admitting: Pulmonary Disease

## 2020-09-27 ENCOUNTER — Ambulatory Visit (INDEPENDENT_AMBULATORY_CARE_PROVIDER_SITE_OTHER): Payer: Medicare Other

## 2020-09-27 ENCOUNTER — Ambulatory Visit (INDEPENDENT_AMBULATORY_CARE_PROVIDER_SITE_OTHER): Payer: Medicare Other | Admitting: Pulmonary Disease

## 2020-09-27 VITALS — BP 122/62 | HR 77 | Temp 97.9°F | Ht 67.0 in | Wt 261.6 lb

## 2020-09-27 DIAGNOSIS — J449 Chronic obstructive pulmonary disease, unspecified: Secondary | ICD-10-CM | POA: Diagnosis not present

## 2020-09-27 NOTE — Patient Instructions (Signed)
I am glad you are stable with regard to breathing Continue the breztri inhaler and supplemental oxygen Continue the exercise regimen from pulmonary rehab at home to stay active  We will schedule PFTs at next available Follow-up in clinic in 1 to 2 months.

## 2020-09-27 NOTE — Progress Notes (Signed)
Ricardo Burch    [id number]    1961-06-08  Primary Care Physician:Gosrani, Doristine Johns, MD  Referring Physician: Doree Albee, MD [address] [place],  [place] [address]  Chief complaint:   Follow up for Severe COPD Severe OSA on autoset Active smoker  HPI: Ricardo Burch is a 59 year old active smoker, COPD. He's had multiple hospitalizations with COPD, CHF exacerbations in the past.He was hospitalized from 07/26/16-07/30/16 with acute pulmonary edema, acute exacerbation of COPD. He was intubated briefly for hypercarbia.   He has finished the pulmonary rehab and reports improved dyspnea. He has been on a rotating list of inhalers mainly due to his inability to pay co-pay. [name] He has been started on CPAP after a repeat titration study and is tolerating it well.  Interim History: States that he quit smoking around March 2021 but has history of relapsing frequently Anoro changed to Fabrica at last visit.  Does not note any significant change in his breathing Continues on Daliresp and supplemental oxygen  Finished repeat rehab a couple of weeks ago Compliant with CPAP.  He has heard about lung volume reduction from a friend and is interested in exploring this option.  Outpatient Encounter Medications as of 09/27/2020  Medication Sig  . albuterol (VENTOLIN HFA) 108 (90 Base) MCG/ACT inhaler Inhale 1 or 2 puffs by mouth every 6 hours as needed for wheezing or shortness of breath  . amLODipine (NORVASC) 5 MG tablet TAKE 1 TABLET BY MOUTH DAILY  . Budeson-Glycopyrrol-Formoterol (BREZTRI AEROSPHERE) 160-9-4.8 MCG/ACT AERO Inhale 2 puffs into the lungs 2 (two) times daily.  . Cholecalciferol (VITAMIN D-3) 125 MCG (5000 UT) TABS Take 2 tablets by mouth daily.  [name] DALIRESP 500 MCG TABS tablet TAKE 1 TABLET (500 MCG TOTAL) BY MOUTH DAILY.  . fluticasone (FLONASE) 50 MCG/ACT nasal spray INSTILL 2 SPRAYS IN EACH NOSTRIL TWICE DAILY  . ipratropium-albuterol (DUONEB) 0.5-2.5 (3) MG/3ML SOLN  Take 3 mLs by nebulization every 4 (four) hours as needed.  . OXYGEN Inhale 3 L into the lungs daily. continuous  . pantoprazole (PROTONIX) 40 MG tablet Take 1 tablet (40 mg total) by mouth daily.  . tadalafil (CIALIS) 20 MG tablet Take 0.5 tablets (10 mg total) by mouth daily as needed for erectile dysfunction.  [name] testosterone cypionate (DEPOTESTOSTERONE CYPIONATE) 200 MG/ML injection Inject 0.6 mLs (120 mg total) into the muscle once a week. 0.63ml/week (Patient not taking: Reported on 09/27/2020)  . [DISCONTINUED] amLODipine (NORVASC) 5 MG tablet Take 1 tablet (5 mg total) by mouth daily.  . [DISCONTINUED] meloxicam (MOBIC) 7.5 MG tablet Take 1 tablet (7.5 mg total) by mouth daily.   Facility-Administered Encounter Medications as of 09/27/2020  Medication  . testosterone cypionate (DEPOTESTOSTERONE CYPIONATE) injection 100 mg   Physical Exam: Blood pressure 122/62, pulse 77, temperature 97.9 F (36.6 C), temperature source Oral, height 5\' 7"  (1.702 m), weight 261 lb 9.6 oz (118.7 kg), SpO2 94 %. Gen:      No acute distress HEENT:  EOMI, sclera anicteric Neck:     No masses; no thyromegaly Lungs:    Clear to auscultation bilaterally; normal respiratory effort CV:         Regular rate and rhythm; no murmurs Abd:      + bowel sounds; soft, non-tender; no palpable masses, no distension Ext:    No edema; adequate peripheral perfusion Skin:      Warm and dry; no rash Neuro: alert and oriented x 3 Psych:  normal mood and affect  Data Reviewed: Imaging Screening CT chest 07/14/2019-emphysema, subcentimeter pulmonary nodule.  Atelectasis along the right major fissure.  Screening CT chest 08/21/2020-emphysema, upper lobe predominant.  Stable pulmonary nodules I have reviewed the images personally.  PFTs 01/16/16 FVC 2.43 [58%], FEV1 1.49 [45%), F/F 61, TLC 81%,DLCO 56% Severe obstruction, moderate reduction in diffusion capacity. Okay to get his download download he can get  Sleep PSG  10/22/15  evere OSA, AHI 151. Started on an AutoSet CPAP titration study  Initiate CPAP at 12 with 2 L oxygen, fullface mask.  CPAP compliance 09/27/2020 100% compliant with good response  Labs CBC 08/24/17-absolute eosinophil count 254 CBC 06/13/2019-WBC 12.7, eos 1%, absolute eosinophil count 127 Alpha-1 antitrypsin 06/15/2019-134, PI MM  Cardiac Echo (09/18/15) The right ventricular systolic pressure was increased consistent with moderate pulmonary hypertension.moderate concentric LVH. LVEF 60-65 percent. Grade 2 diastolic dysfunction. RV cavity size severe grade dilated. RV systolic function moderately to severely reduced. PA pressure 56  Assessment:  Severe COPD Continues on Daliresp, Bevespi  Continue supplemental oxygen Work on weight loss with diet and exercise. Has finished pulmonary rehab twice overall.  Encouraged him to stay active at home.  He is interested in exploring lung volume reduction.  I will schedule him for PFTs if he meets the criteria  Severe OSA Stable on CPAP.  Continue with current setting of 12. Encouraged him to use it every day.  Download reviewed with good compliance and response  Tobacco use Congratulated on quitting smoking. Continue low-dose screening CTs   Pulmonary hypertension Likely group 3 pulmonary hypertension secondary to COPD, OSA.  Continue to monitor.  Health maintenance Up-to-date with Covid.  Encouraged him to get the booster shot Up-to-date with flu and pneumonia vaccination  Plan/Recommendations: - Continue inhalers, Daliresp, supplemental oxygen - Continue CPAP - Screening CT of the chest - Schedule PFTs  Marshell Garfinkel MD [facility] 09/27/2020, 11:25 AM  CC: Doree Albee, MD

## 2020-10-04 ENCOUNTER — Ambulatory Visit (INDEPENDENT_AMBULATORY_CARE_PROVIDER_SITE_OTHER): Payer: Medicare Other

## 2020-10-04 ENCOUNTER — Encounter (INDEPENDENT_AMBULATORY_CARE_PROVIDER_SITE_OTHER): Payer: Medicare Other | Admitting: Internal Medicine

## 2020-10-05 DIAGNOSIS — J449 Chronic obstructive pulmonary disease, unspecified: Secondary | ICD-10-CM | POA: Diagnosis not present

## 2020-10-25 ENCOUNTER — Other Ambulatory Visit: Payer: Self-pay

## 2020-10-25 ENCOUNTER — Ambulatory Visit (INDEPENDENT_AMBULATORY_CARE_PROVIDER_SITE_OTHER): Payer: Medicare Other | Admitting: Internal Medicine

## 2020-10-25 ENCOUNTER — Encounter (INDEPENDENT_AMBULATORY_CARE_PROVIDER_SITE_OTHER): Payer: Self-pay | Admitting: Internal Medicine

## 2020-10-25 VITALS — BP 140/82 | HR 77 | Temp 97.6°F | Resp 18 | Ht 67.0 in | Wt 256.0 lb

## 2020-10-25 DIAGNOSIS — J449 Chronic obstructive pulmonary disease, unspecified: Secondary | ICD-10-CM

## 2020-10-25 DIAGNOSIS — K4090 Unilateral inguinal hernia, without obstruction or gangrene, not specified as recurrent: Secondary | ICD-10-CM

## 2020-10-25 DIAGNOSIS — I10 Essential (primary) hypertension: Secondary | ICD-10-CM | POA: Diagnosis not present

## 2020-10-25 DIAGNOSIS — E782 Mixed hyperlipidemia: Secondary | ICD-10-CM

## 2020-10-25 DIAGNOSIS — R7303 Prediabetes: Secondary | ICD-10-CM | POA: Diagnosis not present

## 2020-10-25 DIAGNOSIS — Z125 Encounter for screening for malignant neoplasm of prostate: Secondary | ICD-10-CM

## 2020-10-25 NOTE — Progress Notes (Signed)
Metrics: Intervention Frequency ACO  Documented Smoking Status Yearly  Screened one or more times in 24 months  Cessation Counseling or  Active cessation medication Past 24 months  Past 24 months   Guideline developer: UpToDate (See UpToDate for funding source) Date Released: 2014       Wellness Office Visit  Subjective:  Patient ID: Ricardo Burch, male    DOB: May 26, 1961  Age: 59 y.o. MRN: [id number]  CC: This man comes in for follow-up of his multiple medical problems which include hypertension, COPD, dyslipidemia, morbid obesity, history of prediabetes and also vitamin D deficiency. HPI  His main complaint today was 1 of right inguinal area discomfort which she has had for some time and the discomfort seems to go around to his right back area.  In May of this year, we did do a CT renal stone study and it was negative.  He denies any kind of mass in the inguinal area that he is detected but he does describe discomfort in the right testicular area also although he has no found any kind of mass there. His COPD is fairly stable and he does follow-up with pulmonology.  He is on all the inhalers for this.  His oxygen level stays stable and he is on nasal cannula oxygen at home. Since stopping testosterone therapy approximately a month ago, he has noticed that the tightness in his chest is disappeared but on the other hand he does feel more tired than he was before.  His libido and erectile dysfunction are not a problem. He is supposed to take vitamin D3 supplementation but he has run out so he will go back on this as soon as possible. He also has morbid obesity and is trying to work hard to improve this. He also has hypertension and continues with amlodipine and is compliant with it. Past Medical History:  Diagnosis Date  . Allergy   . CHF (congestive heart failure) ([place])   . Chronic kidney disease   . COPD (chronic obstructive pulmonary disease) ([place]) Dx 2015  . Depression   . Eczema     since childhood   . Erectile dysfunction 08/18/2019  . GERD (gastroesophageal reflux disease) 01/25/2020  . Hyperlipidemia 01/12/2018  . Hypertension   . Oxygen deficiency   . Sleep apnea    CPAP  . Substance abuse ([place])    MJ, cocaine  . Testicular failure 07/22/2019   Past Surgical History:  Procedure Laterality Date  . COLONOSCOPY WITH PROPOFOL N/A 06/25/2017   Procedure: COLONOSCOPY WITH PROPOFOL;  Surgeon: Daneil Dolin, MD;  [facility];  Service: Endoscopy;  Laterality: N/A;  2:00pm  . MULTIPLE EXTRACTIONS WITH ALVEOLOPLASTY N/A 03/22/2016   Procedure: MULTIPLE EXTRACTION WITH ALVEOLOPLASTY;  Surgeon: Diona Browner, DDS;  Location: [facility];  Service: Oral Surgery;  Laterality: N/A;  . NO PAST SURGERIES    . POLYPECTOMY  06/25/2017   Procedure: POLYPECTOMY;  Surgeon: Daneil Dolin, MD;  [facility];  Service: Endoscopy;;  colon     Family History  Problem Relation Age of Onset  . Arthritis Mother   . Hypertension Mother   . Stroke Mother        age 51  . Cerebral aneurysm Father   . Alcohol abuse Father   . Cancer Brother   . Diabetes Neg Hx   . Heart disease Neg Hx     Social History   Social History Narrative    Lives alone.  5 daughters 54, 38 yo twins, eldest 2 are married live in [place]. Retired.Girlfriend works in Armed forces logistics/support/administrative officer.   Social History   Tobacco Use  . Smoking status: Former Smoker    Packs/day: 1.00    Years: 38.00    Pack years: 38.00    Types: Cigarettes    Quit date: 02/21/2020    Years since quitting: 0.6  . Smokeless tobacco: Never Used  Substance Use Topics  . Alcohol use: Not Currently    Alcohol/week: 1.0 standard drink    Types: 1 Cans of beer per week    Comment: Socially x 1/month currently (as of 01/25/20); previously: occassionally: weekends, 6-pack or less on a day; or half pint of liquior    Current Meds  Medication Sig  . albuterol (VENTOLIN HFA) 108 (90 Base) MCG/ACT inhaler Inhale 1 or 2  puffs by mouth every 6 hours as needed for wheezing or shortness of breath  . amLODipine (NORVASC) 5 MG tablet TAKE 1 TABLET BY MOUTH DAILY  . Budeson-Glycopyrrol-Formoterol (BREZTRI AEROSPHERE) 160-9-4.8 MCG/ACT AERO Inhale 2 puffs into the lungs 2 (two) times daily.  . Cholecalciferol (VITAMIN D-3) 125 MCG (5000 UT) TABS Take 2 tablets by mouth daily.  [name] DALIRESP 500 MCG TABS tablet TAKE 1 TABLET (500 MCG TOTAL) BY MOUTH DAILY.  . fluticasone (FLONASE) 50 MCG/ACT nasal spray INSTILL 2 SPRAYS IN EACH NOSTRIL TWICE DAILY  . ipratropium-albuterol (DUONEB) 0.5-2.5 (3) MG/3ML SOLN Take 3 mLs by nebulization every 4 (four) hours as needed.  . naproxen (NAPROSYN) 500 MG tablet Take 500 mg by mouth 2 (two) times daily.  . OXYGEN Inhale 3 L into the lungs daily. continuous  . pantoprazole (PROTONIX) 40 MG tablet Take 1 tablet (40 mg total) by mouth daily.  . tadalafil (CIALIS) 20 MG tablet Take 0.5 tablets (10 mg total) by mouth daily as needed for erectile dysfunction.  . [DISCONTINUED] COVID-19 Specimen Collection KIT TEST AS DIRECTED TODAY  . [DISCONTINUED] testosterone cypionate (DEPOTESTOSTERONE CYPIONATE) 200 MG/ML injection Inject 0.6 mLs (120 mg total) into the muscle once a week. 0.52m/week   Current Facility-Administered Medications for the 10/25/20 encounter (Office Visit) with GDoree Albee MD  Medication  . testosterone cypionate (DEPOTESTOSTERONE CYPIONATE) injection 100 mg      [facility] 07/27/2020 02/14/2020 01/12/2020 05/26/2018 04/29/2018  Decreased Interest 1 0 0 0 0  Down, Depressed, Hopeless 1 1 0 0 0  PHQ - 2 Score 2 1 0 0 0  Altered sleeping 0 1 - - 0  Tired, decreased energy 0 1 - - -  Change in appetite 1 0 - - 0  Feeling bad or failure about yourself  0 1 - - 1  Trouble concentrating 0 1 - - 0  Moving slowly or fidgety/restless 0 0 - - 0  Suicidal thoughts 0 0 - - 0  PHQ-9 Score 3 5 - - 1  Difficult doing work/chores - Somewhat difficult - - -   Some recent data might be hidden     Objective:   Today's Vitals: BP 140/82 (BP Location: Left Arm, Patient Position: Sitting, Cuff Size: Normal)   Pulse 77   Temp 97.6 F (36.4 C) (Temporal)   Resp 18   Ht _0  (1.702 m)   Wt 256 lb (116.1 kg)   SpO2 97%   BMI 40.10 kg/m  Vitals with BMI 10/25/2020 09/27/2020 09/13/2020  Height _1  _2  _3   Weight 256 lbs 261 lbs 10 oz  256 lbs  BMI 40.09 68.16 61.96  Systolic 940 982 867  Diastolic 82 62 80  Pulse 77 77 78     Physical Exam   He remains morbidly obese but he has lost about 5 pounds since the last time he was seen in the system.  Blood pressure is borderline elevated today.  Lung fields are clear without any wheezing or bronchial breathing or crackles.  Heart sounds are present without murmurs.  There is no added sounds.  No carotid bruits. Examination of his inguinal area shows possibly a right cough impulse consistent with a hernia.  Testicular examination is within normal limits with no evidence of mass or tenderness.    Assessment   1. Obstructive chronic bronchitis without exacerbation ([place])   2. Morbid obesity ([place])   3. Essential hypertension   4. Mixed hyperlipidemia   5. Pre-diabetes   6. Inguinal hernia of right side without obstruction or gangrene   7. Special screening for malignant neoplasm of prostate       Tests ordered Orders Placed This Encounter  Procedures  . CBC  . COMPLETE METABOLIC PANEL WITH GFR  . Lipid panel  . Hemoglobin A1c  . T3, free  . T4, free  . TSH  . PSA, Total with Reflex to PSA, Free  . Ambulatory referral to General Surgery     Plan: 1. He will continue with his inhalers for his COPD which appears to be stable and he will follow-up with pulmonology soon. 2. He will continue to work on nutrition to improve his morbid obesity and lose further weight. 3. He will continue with amlodipine for hypertension and as he loses weight, his blood pressure should  improve. 4. We will check a lipid panel in terms of his dyslipidemia. 5. Previously he was prediabetic and we will check an A1c now. 6. I will refer him to surgery for further evaluation of the possible right inguinal hernia. 7. Further recommendations will depend on blood results and I will have him follow-up with Sarah in about 3 months time.   No orders of the defined types were placed in this encounter.   Doree Albee, MD

## 2020-10-26 LAB — COMPLETE METABOLIC PANEL WITH GFR
AG Ratio: 1.3 (calc) (ref 1.0–2.5)
ALT: 41 U/L (ref 9–46)
AST: 28 U/L (ref 10–35)
Albumin: 4.3 g/dL (ref 3.6–5.1)
Alkaline phosphatase (APISO): 86 U/L (ref 35–144)
BUN: 13 mg/dL (ref 7–25)
CO2: 33 mmol/L — ABNORMAL HIGH (ref 20–32)
Calcium: 9.2 mg/dL (ref 8.6–10.3)
Chloride: 97 mmol/L — ABNORMAL LOW (ref 98–110)
Creat: 0.92 mg/dL (ref 0.70–1.33)
GFR, Est African American: 107 mL/min/{1.73_m2} (ref >=60)
GFR, Est Non African American: 92 mL/min/{1.73_m2} (ref >=60)
Globulin: 3.3 g/dL (calc) (ref 1.9–3.7)
Glucose, Bld: 85 mg/dL (ref 65–99)
Potassium: 4.9 mmol/L (ref 3.5–5.3)
Sodium: 136 mmol/L (ref 135–146)
Total Bilirubin: 1.3 mg/dL — ABNORMAL HIGH (ref 0.2–1.2)
Total Protein: 7.6 g/dL (ref 6.1–8.1)

## 2020-10-26 LAB — LIPID PANEL
Cholesterol: 157 mg/dL (ref <200)
HDL: 56 mg/dL (ref >=40)
LDL Cholesterol (Calc): 86 mg/dL (calc)
Non-HDL Cholesterol (Calc): 101 mg/dL (calc) (ref <130)
Total CHOL/HDL Ratio: 2.8 (calc) (ref <5.0)
Triglycerides: 64 mg/dL (ref <150)

## 2020-10-26 LAB — CBC
HCT: 52.5 % — ABNORMAL HIGH (ref 38.5–50.0)
Hemoglobin: 18.1 g/dL — ABNORMAL HIGH (ref 13.2–17.1)
MCH: 32.2 pg (ref 27.0–33.0)
MCHC: 34.5 g/dL (ref 32.0–36.0)
MCV: 93.4 fL (ref 80.0–100.0)
MPV: 10.9 fL (ref 7.5–12.5)
Platelets: 184 10*3/uL (ref 140–400)
RBC: 5.62 10*6/uL (ref 4.20–5.80)
RDW: 12.9 % (ref 11.0–15.0)
WBC: 8.9 10*3/uL (ref 3.8–10.8)

## 2020-10-26 LAB — PSA, TOTAL WITH REFLEX TO PSA, FREE: PSA, Total: 0.3 ng/mL (ref <=4.0)

## 2020-10-26 LAB — HEMOGLOBIN A1C
Hgb A1c MFr Bld: 5.8 % of total Hgb — ABNORMAL HIGH (ref <5.7)
Mean Plasma Glucose: 120 mg/dL
eAG (mmol/L): 6.6 mmol/L

## 2020-10-26 LAB — T3, FREE: T3, Free: 3.5 pg/mL (ref 2.3–4.2)

## 2020-10-26 LAB — T4, FREE: Free T4: 1 ng/dL (ref 0.8–1.8)

## 2020-10-26 LAB — TSH: TSH: 1.21 mIU/L (ref 0.40–4.50)

## 2020-10-27 ENCOUNTER — Encounter (HOSPITAL_COMMUNITY): Payer: Self-pay | Admitting: Radiology

## 2020-10-30 ENCOUNTER — Telehealth: Payer: Self-pay | Admitting: Pulmonary Disease

## 2020-10-30 NOTE — Telephone Encounter (Signed)
lmtcb for pt.  

## 2020-10-30 NOTE — Telephone Encounter (Signed)
Spoke with pt, states his girlfriend is sick and now he's having the same s/s- c/o sinus congestion, pnd, runny nose, sore throat, nonprod cough, temp of 99. S/s started X1 week ago.  Pt received a covid test X 1 week ago, the same day s/s started, and test was negative per pt.  This is not available in Epic.    Taking Advil cold and sinus, flonase daily.   Pt has not been using Albuterol inhaler/nebulizer.    Pharmacy: [facility] to [place] as Dr. Vaughan Browner is not available.  Please advise on recs, thanks!

## 2020-10-30 NOTE — Telephone Encounter (Signed)
10/30/2020  Would request copy if patient is negative Covid test.  Also would recommend that patient consider being retested for COVID-19 if symptoms continue to persist especially patient was tested early (before 5-day exposure mark).  Or if patient utilized and at home Covid test.  Agree with patient taking Advil Cold and Sinus and Flonase.  Patient can also utilize nasal saline rinses:  Start nasal saline rinses twice daily Use distilled water Shake well Get bottle lukewarm like a baby bottle  No new recommendations at this time.  Elisha Headland, FNP

## 2020-10-31 NOTE — Telephone Encounter (Signed)
Pt returning call from office yesterday . Please advise

## 2020-10-31 NOTE — Telephone Encounter (Signed)
Called and spoke with pt and he is aware of results per BPM.  Nothing further is needed.

## 2020-11-01 ENCOUNTER — Other Ambulatory Visit: Payer: Self-pay | Admitting: Family Medicine

## 2020-11-01 DIAGNOSIS — K4090 Unilateral inguinal hernia, without obstruction or gangrene, not specified as recurrent: Secondary | ICD-10-CM

## 2020-11-05 DIAGNOSIS — J449 Chronic obstructive pulmonary disease, unspecified: Secondary | ICD-10-CM | POA: Diagnosis not present

## 2020-11-06 DIAGNOSIS — Z20822 Contact with and (suspected) exposure to covid-19: Secondary | ICD-10-CM | POA: Diagnosis not present

## 2020-11-13 ENCOUNTER — Other Ambulatory Visit: Payer: Self-pay | Admitting: Pulmonary Disease

## 2020-11-13 DIAGNOSIS — J438 Other emphysema: Secondary | ICD-10-CM

## 2020-11-23 ENCOUNTER — Other Ambulatory Visit: Payer: Self-pay

## 2020-11-23 ENCOUNTER — Encounter: Payer: Self-pay | Admitting: General Surgery

## 2020-11-23 ENCOUNTER — Ambulatory Visit (INDEPENDENT_AMBULATORY_CARE_PROVIDER_SITE_OTHER): Payer: Medicare Other | Admitting: General Surgery

## 2020-11-23 VITALS — BP 119/80 | HR 79 | Temp 97.9°F | Resp 16 | Ht 67.0 in | Wt 258.0 lb

## 2020-11-23 DIAGNOSIS — R1031 Right lower quadrant pain: Secondary | ICD-10-CM

## 2020-11-23 NOTE — Patient Instructions (Signed)
Inguinal Hernia, Adult An inguinal hernia develops when fat or the intestines push through a weak spot in a muscle where the leg meets the lower abdomen (groin). This creates a bulge. This kind of hernia could also be:  In the scrotum, if you are male.  In folds of skin around the vagina, if you are male. There are three types of inguinal hernias:  Hernias that can be pushed back into the abdomen (are reducible). This type rarely causes pain.  Hernias that are not reducible (are incarcerated).  Hernias that are not reducible and lose their blood supply (are strangulated). This type of hernia requires emergency surgery. What are the causes? This condition is caused by having a weak spot in the muscles or tissues in your groin. This develops over time. The hernia may poke through the weak spot when you suddenly strain your lower abdominal muscles, such as when you:  Lift a heavy object.  Strain to have a bowel movement. Constipation can lead to straining.  Cough. What increases the risk? This condition is more likely to develop in:  Males.  Pregnant females.  People who: ? Are overweight. ? Work in jobs that require long periods of standing or heavy lifting. ? Have had an inguinal hernia before. ? Smoke or have lung disease. These factors can lead to long-term (chronic) coughing. What are the signs or symptoms? Symptoms may depend on the size of the hernia. Often, a [place] inguinal hernia has no symptoms. Symptoms of a larger hernia may include:  A bulge in the groin area. This is easier to see when standing. It might not be visible when lying down.  Pain or burning in the groin. This may get worse when lifting, straining, or coughing.  A dull ache or a feeling of pressure in the groin.  An unusual bulge in the scrotum, in males. Symptoms of a strangulated inguinal hernia may include:  A bulge in your groin that is very painful and tender to the touch.  A bulge that  turns red or purple.  Fever, nausea, and vomiting.  Inability to have a bowel movement or to pass gas. How is this diagnosed? This condition is diagnosed based on your symptoms, your medical history, and a physical exam. Your health care provider may feel your groin area and ask you to cough. How is this treated? Treatment depends on the size of your hernia and whether you have symptoms. If you do not have symptoms, your health care provider may have you watch your hernia carefully and have you come in for follow-up visits. If your hernia is large or if you have symptoms, you may need surgery to repair the hernia. Follow these instructions at home: Lifestyle  Avoid lifting heavy objects.  Avoid standing for long periods of time.  Do not use any products that contain nicotine or tobacco. These products include cigarettes, chewing tobacco, and vaping devices, such as e-cigarettes. If you need help quitting, ask your health care provider.  Maintain a healthy weight. Preventing constipation You may need to take these actions to prevent or treat constipation:  Drink enough fluid to keep your urine pale yellow.  Take over-the-counter or prescription medicines.  Eat foods that are high in fiber, such as beans, whole grains, and fresh fruits and vegetables.  Limit foods that are high in fat and processed sugars, such as fried or sweet foods. General instructions  You may try to push the hernia back in place by very gently   pressing on it while lying down. Do not try to force the bulge back in if it will not push in easily.  Watch your hernia for any changes in shape, size, or color. Get help right away if you notice any changes.  Take over-the-counter and prescription medicines only as told by your health care provider.  Keep all follow-up visits. This is important. Contact a health care provider if:  You have a fever or chills.  You develop new symptoms.  Your symptoms get  worse. Get help right away if:  You have pain in your groin that suddenly gets worse.  You have a bulge in your groin that: ? Suddenly gets bigger and does not get smaller. ? Becomes red or purple or painful to the touch.  You are a man and you have a sudden pain in your scrotum, or the size of your scrotum suddenly changes.  You cannot push the hernia back in place by very gently pressing on it when you are lying down.  You have nausea or vomiting that does not go away.  You have a fast heartbeat.  You cannot have a bowel movement or pass gas. These symptoms may represent a serious problem that is an emergency. Do not wait to see if the symptoms will go away. Get medical help right away. Call your local emergency services (911 in the [place] Summary  An inguinal hernia develops when fat or the intestines push through a weak spot in a muscle where your leg meets your lower abdomen (groin).  This condition is caused by having a weak spot in muscles or tissues in your groin.  Symptoms may depend on the size of the hernia, and they may include pain or swelling in your groin. A [place] inguinal hernia often has no symptoms.  Treatment may not be needed if you do not have symptoms. If you have symptoms or a large hernia, you may need surgery to repair the hernia.  Avoid lifting heavy objects. Also, avoid standing for long periods of time. This information is not intended to replace advice given to you by your health care provider. Make sure you discuss any questions you have with your health care provider. Document Revised: 06/20/2020 Document Reviewed: 06/20/2020 Elsevier Patient Education  2021 Elsevier Inc.  

## 2020-11-23 NOTE — Progress Notes (Signed)
Ricardo Burch; [id number]; 20-Jul-1961   HPI Patient is a 60 year old black male who was referred to my care by Dr. Anastasio Champion for evaluation and treatment of right groin pain.  Patient states he has had intermittent right groin pain for the past month.  It is intermittent in nature.  Is not necessarily made worse with straining.  He does not notice a lump in that region.  He denies any nausea or vomiting.  He states he sometimes has pain in the right testicle and right inner thigh.  It occurs sporadically.  He is on home oxygen due to COPD.  He has also been listed as a difficult airway. Past Medical History:  Diagnosis Date  . Allergy   . CHF (congestive heart failure) ([place])   . Chronic kidney disease   . COPD (chronic obstructive pulmonary disease) ([place]) Dx 2015  . Depression   . Eczema    since childhood   . Erectile dysfunction 08/18/2019  . GERD (gastroesophageal reflux disease) 01/25/2020  . Hyperlipidemia 01/12/2018  . Hypertension   . Oxygen deficiency   . Screening for HIV (human immunodeficiency virus)   . Sleep apnea    CPAP  . Substance abuse ([place])    MJ, cocaine  . Testicular failure 07/22/2019    Past Surgical History:  Procedure Laterality Date  . COLONOSCOPY WITH PROPOFOL N/A 06/25/2017   Procedure: COLONOSCOPY WITH PROPOFOL;  Surgeon: Daneil Dolin, MD;  [facility];  Service: Endoscopy;  Laterality: N/A;  2:00pm  . MULTIPLE EXTRACTIONS WITH ALVEOLOPLASTY N/A 03/22/2016   Procedure: MULTIPLE EXTRACTION WITH ALVEOLOPLASTY;  Surgeon: Diona Browner, DDS;  Location: [facility];  Service: Oral Surgery;  Laterality: N/A;  . NO PAST SURGERIES    . POLYPECTOMY  06/25/2017   Procedure: POLYPECTOMY;  Surgeon: Daneil Dolin, MD;  [facility];  Service: Endoscopy;;  colon    Family History  Problem Relation Age of Onset  . Arthritis Mother   . Hypertension Mother   . Stroke Mother        age 64  . Cerebral aneurysm Father   . Alcohol abuse Father   . Cancer  Brother   . Diabetes Neg Hx   . Heart disease Neg Hx     Current Outpatient Medications on File Prior to Visit  Medication Sig Dispense Refill  . albuterol (VENTOLIN HFA) 108 (90 Base) MCG/ACT inhaler Inhale 1 or 2 puffs by mouth every 6 hours as needed for wheezing or shortness of breath 18 g 4  . amLODipine (NORVASC) 5 MG tablet TAKE 1 TABLET BY MOUTH DAILY 90 tablet 1  . BREZTRI AEROSPHERE 160-9-4.8 MCG/ACT AERO INHALE 2 PUFFS INTO THE LUNGS 2 (TWO) TIMES DAILY. 10.7 g 5  . Cholecalciferol (VITAMIN D-3) 125 MCG (5000 UT) TABS Take 2 tablets by mouth daily.    [name] DALIRESP 500 MCG TABS tablet TAKE 1 TABLET (500 MCG TOTAL) BY MOUTH DAILY. 30 tablet 5  . fluticasone (FLONASE) 50 MCG/ACT nasal spray INSTILL 2 SPRAYS IN EACH NOSTRIL TWICE DAILY 16 mL 5  . ipratropium-albuterol (DUONEB) 0.5-2.5 (3) MG/3ML SOLN Take 3 mLs by nebulization every 4 (four) hours as needed. 360 mL 11  . naproxen (NAPROSYN) 500 MG tablet Take 500 mg by mouth 2 (two) times daily.    . OXYGEN Inhale 3 L into the lungs daily. continuous    . tadalafil (CIALIS) 20 MG tablet Take 0.5 tablets (10 mg total) by mouth daily as needed for erectile dysfunction.  10 tablet 2  . pantoprazole (PROTONIX) 40 MG tablet Take 1 tablet (40 mg total) by mouth daily. (Patient not taking: Reported on 11/23/2020) 90 tablet 3   No current facility-administered medications on file prior to visit.    Allergies  Allergen Reactions  . Apresoline [Hydralazine] Other (See Comments)    Headache     Social History   Substance and Sexual Activity  Alcohol Use Not Currently  . Alcohol/week: 1.0 standard drink  . Types: 1 Cans of beer per week   Comment: Socially x 1/month currently (as of 01/25/20); previously: occassionally: weekends, 6-pack or less on a day; or half pint of liquior    Social History   Tobacco Use  Smoking Status Former Smoker  . Packs/day: 1.00  . Years: 38.00  . Pack years: 38.00  . Types: Cigarettes  . Quit date:  02/21/2020  . Years since quitting: 0.7  Smokeless Tobacco Never Used    Review of Systems  Constitutional: Positive for malaise/fatigue.  HENT: Positive for sinus pain.   Eyes: Negative.   Respiratory: Positive for shortness of breath.   Gastrointestinal: Positive for heartburn.  Genitourinary: Positive for frequency.  Musculoskeletal: Positive for back pain.  Skin: Negative.   Neurological: Negative.   Endo/Heme/Allergies: Negative.   Psychiatric/Behavioral: Negative.     Objective   Vitals:   11/23/20 0945  BP: 119/80  Pulse: 79  Resp: 16  Temp: 97.9 F (36.6 C)  SpO2: 92%    Physical Exam Vitals reviewed.  Constitutional:      Appearance: Normal appearance. He is obese. He is not ill-appearing.  HENT:     Head: Normocephalic and atraumatic.  Cardiovascular:     Rate and Rhythm: Normal rate and regular rhythm.     Heart sounds: Normal heart sounds. No murmur heard. No friction rub. No gallop.   Pulmonary:     Effort: Pulmonary effort is normal. No respiratory distress.     Breath sounds: Normal breath sounds. No stridor. No wheezing, rhonchi or rales.  Abdominal:     General: Bowel sounds are normal. There is no distension.     Palpations: Abdomen is soft. There is no mass.     Tenderness: There is no abdominal tenderness. There is no guarding or rebound.     Hernia: No hernia is present.     Comments: Patient does have laxity of the inguinal floor bilaterally.  I do not specifically feel of right inguinal hernia.  He has no tenderness over the internal ring.  No hernia appreciated on straining.  Genitourinary:    Testes: Normal.  Skin:    General: Skin is warm and dry.  Neurological:     Mental Status: He is alert and oriented to person, place, and time.     Assessment  Right groin strain.  No specific hernia appreciated on examination. Plan   No need for surgical intervention at this time.  I did tell the patient that he may develop a hernia in the  future.  He is a high risk for surgical intervention given his difficult airway and his COPD.  Patient agrees that this probably is just a strain.  Literature was given.  Follow-up as needed.

## 2020-11-24 ENCOUNTER — Other Ambulatory Visit (HOSPITAL_COMMUNITY)
Admission: RE | Admit: 2020-11-24 | Discharge: 2020-11-24 | Disposition: A | Discharge status: Home / Self Care | Payer: Medicare Other | Source: Ambulatory Visit | Attending: Pulmonary Disease | Admitting: Pulmonary Disease

## 2020-11-24 DIAGNOSIS — Z20822 Contact with and (suspected) exposure to covid-19: Secondary | ICD-10-CM | POA: Insufficient documentation

## 2020-11-24 DIAGNOSIS — Z01812 Encounter for preprocedural laboratory examination: Secondary | ICD-10-CM | POA: Diagnosis not present

## 2020-11-24 LAB — SARS CORONAVIRUS 2 (TAT 6-24 HRS): SARS Coronavirus 2: NEGATIVE

## 2020-11-27 ENCOUNTER — Other Ambulatory Visit: Payer: Self-pay

## 2020-11-27 ENCOUNTER — Ambulatory Visit (INDEPENDENT_AMBULATORY_CARE_PROVIDER_SITE_OTHER): Payer: Medicare Other | Admitting: Pulmonary Disease

## 2020-11-27 ENCOUNTER — Encounter: Payer: Self-pay | Admitting: Pulmonary Disease

## 2020-11-27 VITALS — BP 122/80 | HR 75 | Temp 97.2°F | Ht 67.0 in | Wt 257.6 lb

## 2020-11-27 DIAGNOSIS — J449 Chronic obstructive pulmonary disease, unspecified: Secondary | ICD-10-CM | POA: Diagnosis not present

## 2020-11-27 LAB — PULMONARY FUNCTION TEST
DL/VA % pred: 68 %
DL/VA: 2.98 ml/min/mmHg/L
DLCO cor % pred: 46 %
DLCO cor: 11.89 ml/min/mmHg
DLCO unc % pred: 50 %
DLCO unc: 12.91 ml/min/mmHg
FEF 25-75 Post: 0.64 L/sec
FEF 25-75 Pre: 0.37 L/sec
FEF2575-%Change-Post: 76 %
FEF2575-%Pred-Post: 23 %
FEF2575-%Pred-Pre: 13 %
FEV1-%Change-Post: 24 %
FEV1-%Pred-Post: 37 %
FEV1-%Pred-Pre: 30 %
FEV1-Post: 1.08 L
FEV1-Pre: 0.87 L
FEV1FVC-%Change-Post: 7 %
FEV1FVC-%Pred-Pre: 62 %
FEV6-%Change-Post: 16 %
FEV6-%Pred-Post: 56 %
FEV6-%Pred-Pre: 48 %
FEV6-Post: 1.99 L
FEV6-Pre: 1.71 L
FEV6FVC-%Change-Post: 0 %
FEV6FVC-%Pred-Post: 101 %
FEV6FVC-%Pred-Pre: 100 %
FVC-%Change-Post: 16 %
FVC-%Pred-Post: 56 %
FVC-%Pred-Pre: 48 %
FVC-Post: 2.04 L
FVC-Pre: 1.76 L
Post FEV1/FVC ratio: 53 %
Post FEV6/FVC ratio: 98 %
Pre FEV1/FVC ratio: 49 %
Pre FEV6/FVC Ratio: 97 %
RV % pred: 212 %
RV: 4.29 L
TLC % pred: 99 %
TLC: 6.36 L

## 2020-11-27 NOTE — Progress Notes (Signed)
Full PFT completed today ? ?

## 2020-11-27 NOTE — Progress Notes (Signed)
Ricardo Burch    [id number]    [birth year]  Primary Care Physician:Burch, Ricardo Johns, MD  Referring Physician: Doree Albee, MD [address] [place],  [place] [address]  Chief complaint:   Follow up for Severe COPD Severe OSA on autoset Active smoker  HPI: Ricardo Burch is a 60 year old active smoker, COPD. He's had multiple hospitalizations with COPD, CHF exacerbations in the past.He was hospitalized from 07/26/16-07/30/16 with acute pulmonary edema, acute exacerbation of COPD. He was intubated briefly for hypercarbia.   He has finished the pulmonary rehab and reports improved dyspnea. He has been on a rotating list of inhalers mainly due to his inability to pay co-pay. [name] He has been started on CPAP after a repeat titration study and is tolerating it well.  Interim History: States that he quit smoking around March 2021 but has history of relapsing frequently Anoro changed to Ricardo Burch in 2021.  He feels that this is working for him better Continues on [employer] and supplemental oxygen Compliant with CPAP.  He has heard about lung volume reduction from a friend and is interested in exploring this option.  Outpatient Encounter Medications as of 11/27/2020  Medication Sig  . albuterol (VENTOLIN HFA) 108 (90 Base) MCG/ACT inhaler Inhale 1 or 2 puffs by mouth every 6 hours as needed for wheezing or shortness of breath  . amLODipine (NORVASC) 5 MG tablet TAKE 1 TABLET BY MOUTH DAILY  . BREZTRI AEROSPHERE 160-9-4.8 MCG/ACT AERO INHALE 2 PUFFS INTO THE LUNGS 2 (TWO) TIMES DAILY.  [name] Cholecalciferol (VITAMIN D-3) 125 MCG (5000 UT) TABS Take 2 tablets by mouth daily.  [name] DALIRESP 500 MCG TABS tablet TAKE 1 TABLET (500 MCG TOTAL) BY MOUTH DAILY.  . fluticasone (FLONASE) 50 MCG/ACT nasal spray INSTILL 2 SPRAYS IN EACH NOSTRIL TWICE DAILY  . ipratropium-albuterol (DUONEB) 0.5-2.5 (3) MG/3ML SOLN Take 3 mLs by nebulization every 4 (four) hours as needed.  . naproxen (NAPROSYN) 500 MG tablet  Take 500 mg by mouth 2 (two) times daily.  . OXYGEN Inhale 3 L into the lungs daily. continuous  . tadalafil (CIALIS) 20 MG tablet Take 0.5 tablets (10 mg total) by mouth daily as needed for erectile dysfunction.  . [DISCONTINUED] pantoprazole (PROTONIX) 40 MG tablet Take 1 tablet (40 mg total) by mouth daily. (Patient not taking: Reported on 11/23/2020)   No facility-administered encounter medications on file as of 11/27/2020.   Physical Exam: Blood pressure 122/62, pulse 77, temperature 97.9 F (36.6 C), temperature source Oral, height 5\' 7"  (1.702 m), weight 261 lb 9.6 oz (118.7 kg), SpO2 94 %. Gen:      No acute distress HEENT:  EOMI, sclera anicteric Neck:     No masses; no thyromegaly Lungs:    Clear to auscultation bilaterally; normal respiratory effort CV:         Regular rate and rhythm; no murmurs Abd:      + bowel sounds; soft, non-tender; no palpable masses, no distension Ext:    No edema; adequate peripheral perfusion Skin:      Warm and dry; no rash Neuro: alert and oriented x 3 Psych: normal mood and affect  Data Reviewed: Imaging Screening CT chest 07/14/2019-emphysema, subcentimeter pulmonary nodule.  Atelectasis along the right major fissure.  Screening CT chest 08/21/2020-emphysema, upper lobe predominant.  Stable pulmonary nodules I have reviewed the images personally.  PFTs  01/16/16 FVC 2.43 [58%], FEV1 1.49 [45%), F/F 61, TLC 81%,DLCO 56% Severe obstruction, moderate  reduction in diffusion capacity.  11/27/2020 FVC 2.04 [56%], FEV1 1.08 [37%], F/F 53, TLC 6.36 [99%], DLCO 12.91, 50%] Severe obstruction with air trapping and bronchodilator response.  Severe diffusion defect.  Sleep PSG 10/22/15  evere OSA, AHI 151. Started on an [employer] CPAP titration study  Initiate CPAP at 12 with 2 L oxygen, fullface mask.  CPAP compliance 09/27/2020 100% compliant with good response  Labs CBC 08/24/17-absolute eosinophil count 254 CBC 06/13/2019-WBC 12.7, eos 1%,  absolute eosinophil count 127 Alpha-1 antitrypsin 06/15/2019-134, PI MM  Cardiac Echo (09/18/15) The right ventricular systolic pressure was increased consistent with moderate pulmonary hypertension.moderate concentric LVH. LVEF 60-65 percent. Grade 2 diastolic dysfunction. RV cavity size severe grade dilated. RV systolic function moderately to severely reduced. PA pressure 56  Assessment:  Severe COPD Continues on Daliresp, Bevespi Continue supplemental oxygen Work on weight loss with diet and exercise. Has finished pulmonary rehab twice overall.  Encouraged him to stay active at home.  Discussed PFTs.  He may be a candidate for lung volume reduction and we talked about referral to [facility] intervention[facility] radiology for consideration of endobronchial valve.  He is not sure if he wants to proceed and wants to think about it.  Severe OSA Stable on CPAP.  Continue with current setting of 12. Encouraged him to use it every day.  Download reviewed with good compliance and response  Tobacco use Congratulated on quitting smoking. Continue low-dose screening CTs   Pulmonary hypertension Likely group 3 pulmonary hypertension secondary to COPD, OSA.  Continue to monitor.  Health maintenance Up-to-date with Covid booster. Up-to-date with flu and pneumonia vaccination  Plan/Recommendations: - Continue inhalers, Daliresp, supplemental oxygen - Continue CPAP - Screening CT of the chest  Ricardo Garfinkel MD [facility] 11/27/2020, 11:32 AM  CC: Ricardo Albee, MD

## 2020-11-27 NOTE — Patient Instructions (Signed)
I am glad you are stable with regard to your breathing Continue the inhalers, Daliresp Follow-up in 6 months.

## 2020-12-06 DIAGNOSIS — J449 Chronic obstructive pulmonary disease, unspecified: Secondary | ICD-10-CM | POA: Diagnosis not present

## 2020-12-12 ENCOUNTER — Telehealth: Payer: Self-pay | Admitting: Pulmonary Disease

## 2020-12-12 ENCOUNTER — Other Ambulatory Visit (INDEPENDENT_AMBULATORY_CARE_PROVIDER_SITE_OTHER): Payer: Self-pay | Admitting: Internal Medicine

## 2020-12-12 DIAGNOSIS — J438 Other emphysema: Secondary | ICD-10-CM

## 2020-12-12 MED ORDER — AMLODIPINE BESYLATE 5 MG PO TABS: 5.0000 mg | ORAL_TABLET | Freq: Every day | ORAL | 1 refills | Status: DC

## 2020-12-12 MED ORDER — BREZTRI AEROSPHERE 160-9-4.8 MCG/ACT IN AERO: 2.0000 | INHALATION_SPRAY | Freq: Two times a day (BID) | RESPIRATORY_TRACT | 0 refills | Status: DC

## 2020-12-12 NOTE — Telephone Encounter (Signed)
Called and spoke with pt and he stated that he would need breztri sent to [facility] since he will run out and the mail order cannot deliver till Friday.  This has been done and pt is aware.

## 2020-12-18 DIAGNOSIS — G4733 Obstructive sleep apnea (adult) (pediatric): Secondary | ICD-10-CM | POA: Diagnosis not present

## 2020-12-18 DIAGNOSIS — J449 Chronic obstructive pulmonary disease, unspecified: Secondary | ICD-10-CM | POA: Diagnosis not present

## 2020-12-25 NOTE — Telephone Encounter (Signed)
Encounter opened in error

## 2021-01-02 NOTE — Progress Notes (Signed)
Cardiology Office Note    Date:  01/15/2021   ID:  Ricardo Burch, DOB Nov 10, 1960, MRN [id number]   PCP:  Doree Albee, MD   [place]  Cardiologist:  Kate Sable, MD (Inactive)  Advanced Practice Provider:  No care team member to display Electrophysiologist:  None   [phone]   Chief Complaint  Patient presents with  . Follow-up    History of Present Illness:  Ricardo Burch is a 60 y.o. male with severe COPD on O2 3L, severe OSA, tobacco use, and pulmonary HTN secondary to severe COPD and OSA, hypertension  Patient comes in for yearly f/u. Denies cardiac complaints. Uses CPAP and on O2 here. Overall doing well. Says he takes his O2 off on occasion but keeps a pulse ox with him. Had lost some weight but gained it all back. Snacking before bed. 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   Past Medical History:  Diagnosis Date  . Allergy   . CHF (congestive heart failure) ([place])   . Chronic kidney disease   . COPD (chronic obstructive pulmonary disease) ([place]) Dx 2015  . Depression   . Eczema    since childhood   . Erectile dysfunction 08/18/2019  . GERD (gastroesophageal reflux disease) 01/25/2020  . Hyperlipidemia 01/12/2018  . Hypertension   . Oxygen deficiency   . Screening for HIV (human immunodeficiency virus)   . Sleep apnea    CPAP  . Substance abuse ([place])    MJ, cocaine  . Testicular failure 07/22/2019    Past Surgical History:  Procedure Laterality Date  . COLONOSCOPY WITH PROPOFOL N/A 06/25/2017   Procedure: COLONOSCOPY WITH PROPOFOL;  Surgeon: Daneil Dolin, MD;  [facility];  Service: Endoscopy;  Laterality: N/A;  2:00pm  . MULTIPLE EXTRACTIONS WITH ALVEOLOPLASTY N/A 03/22/2016   Procedure: MULTIPLE EXTRACTION WITH ALVEOLOPLASTY;  Surgeon: Diona Browner, DDS;  Location: [facility];  Service: Oral Surgery;  Laterality: N/A;  . NO PAST SURGERIES    . POLYPECTOMY  06/25/2017   Procedure: POLYPECTOMY;  Surgeon: Daneil Dolin, MD;  [facility];  Service: Endoscopy;;  colon    Current Medications: Current Meds  Medication Sig  . albuterol (VENTOLIN HFA) 108 (90 Base) MCG/ACT inhaler Inhale 1 or 2 puffs by mouth every 6 hours as needed for wheezing or shortness of breath  . amLODipine (NORVASC) 5 MG tablet Take 1 tablet (5 mg total) by mouth daily.  . Budeson-Glycopyrrol-Formoterol (BREZTRI AEROSPHERE) 160-9-4.8 MCG/ACT AERO Inhale 2 puffs into the lungs 2 (two) times daily.  . Cholecalciferol (VITAMIN D-3) 125 MCG (5000 UT) TABS Take 2 tablets by mouth daily.  [name] DALIRESP 500 MCG TABS tablet TAKE 1 TABLET (500 MCG TOTAL) BY MOUTH DAILY.  . fluticasone (FLONASE) 50 MCG/ACT nasal spray INSTILL 2 SPRAYS IN EACH NOSTRIL TWICE DAILY  . ipratropium-albuterol (DUONEB) 0.5-2.5 (3) MG/3ML SOLN Take 3 mLs by nebulization every 4 (four) hours as needed.  . OXYGEN Inhale 3 L into the lungs daily. continuous     Allergies:   Apresoline [hydralazine]   Social History   Socioeconomic History  . Marital status: Single    Spouse name: Not on file  . Number of children: 5  . Years of education: 63  . Highest education level: Not on file  Occupational History  . Occupation: Unemployed     Comment: car wash details  Tobacco Use  . Smoking status: Former Smoker    Packs/day: 1.00  Years: 38.00    Pack years: 38.00    Types: Cigarettes    Quit date: 02/21/2020    Years since quitting: 0.9  . Smokeless tobacco: Never Used  Vaping Use  . Vaping Use: Never used  Substance and Sexual Activity  . Alcohol use: Not Currently    Alcohol/week: 1.0 standard drink    Types: 1 Cans of beer per week    Comment: Socially x 1/month currently (as of 01/25/20); previously: occassionally: weekends, 6-pack or less on a day; or half pint of liquior  . Drug use: Yes    Types: Marijuana, Cocaine    Comment: 01/25/20-marijuana few weeks ago, no cocaine  . Sexual activity: Never  Other Topics Concern  . Not on file   Social History Narrative    Lives alone.    5 daughters 61, 75 yo twins, eldest 2 are married live in [place]. Retired.Girlfriend works in Armed forces logistics/support/administrative officer.   Social Determinants of Health   Financial Resource Strain: Not on file  Food Insecurity: Not on file  Transportation Needs: Not on file  Physical Activity: Not on file  Stress: Not on file  Social Connections: Not on file     Family History:  The patient's family history includes Alcohol abuse in his father; Arthritis in his mother; Cancer in his brother; Cerebral aneurysm in his father; Hypertension in his mother; Stroke in his mother.   ROS:   Please see the history of present illness.    ROS All other systems reviewed and are negative.   PHYSICAL EXAM:   VS:  BP (!) 142/88   Pulse 86   Ht 5\' 7"  (1.702 m)   Wt 262 lb 3.2 oz (118.9 kg)   SpO2 91%   BMI 41.07 kg/m   Physical Exam  GEN: Obese, on O2, in no acute distress  Neck: no JVD, carotid bruits, or masses Cardiac:RRR; S4, no murmurs, rubs Respiratory:  Decreased breath sounds but clear to auscultation bilaterally, normal work of breathing GI: soft, nontender, nondistended, + BS Ext: without cyanosis, clubbing, or edema, Good distal pulses bilaterally Neuro:  Alert and Oriented x 3 Psych: euthymic mood, full affect  Wt Readings from Last 3 Encounters:  01/15/21 262 lb 3.2 oz (118.9 kg)  11/27/20 257 lb 9.6 oz (116.8 kg)  11/23/20 258 lb (117 kg)      Studies/Labs Reviewed:   EKG:  EKG is ordered today.  The ekg ordered today demonstrates NSR with RBBB-unchanged  Recent Labs: 10/25/2020: ALT 41; BUN 13; Creat 0.92; Hemoglobin 18.1; Platelets 184; Potassium 4.9; Sodium 136; TSH 1.21   Lipid Panel    Component Value Date/Time   CHOL 157 10/25/2020 0926   TRIG 64 10/25/2020 0926   HDL 56 10/25/2020 0926   CHOLHDL 2.8 10/25/2020 0926   VLDL 9 09/24/2018 0630   LDLCALC 86 10/25/2020 0926    Additional studies/ records that were reviewed today  include:  Echocardiogram: 09/2018 Study Conclusions   - Left ventricle: The cavity size was normal. Wall thickness was   increased in a pattern of moderate LVH. Systolic function was   normal. The estimated ejection fraction was in the range of 60%   to 65%. Wall motion was normal; there were no regional wall   motion abnormalities. The study is not technically sufficient to   allow evaluation of LV diastolic function. - Aortic valve: Valve area (VTI): 2.53 cm^2. Valve area (Vmax):   2.11 cm^2. Valve area (Vmean): 1.82 cm^2. -  Right ventricle: The cavity size was moderately to severely   dilated. - Right atrium: The atrium was moderately dilated. - Technically difficult study. Echocontrast was used to enhance   visualization.      Risk Assessment/Calculations:         ASSESSMENT:    1. Right heart failure due to pulmonary hypertension ([place])   2. Essential hypertension   3. Chronic obstructive pulmonary disease, unspecified COPD type ([place])   4. OSA on CPAP   5. Morbid obesity ([place])      PLAN:  In order of problems listed above:  Pulmonary hypertension felt secondary to severe COPD and severe OSA on home O2.  Echo 2019 low normal RV systolic function. Overall doing well. No change in symptoms.  Hypertension controlled on amlodipine.  Severe COPD on O2 3 L  OSA compliant on CPAP  Obesity-weight loss recommended to patient.   Shared Decision Making/Informed Consent        Medication Adjustments/Labs and Tests Ordered: Current medicines are reviewed at length with the patient today.  Concerns regarding medicines are outlined above.  Medication changes, Labs and Tests ordered today are listed in the Patient Instructions below. Patient Instructions   Medication Instructions:  Your physician recommends that you continue on your current medications as directed. Please refer to the Current Medication list given to you today.  *If you need a refill on your cardiac  medications before your next appointment, please call your pharmacy*   Lab Work: None  If you have labs (blood work) drawn today and your tests are completely normal, you will receive your results only by: [name] MyChart Message (if you have MyChart) OR . A paper copy in the mail If you have any lab test that is abnormal or we need to change your treatment, we will call you to review the results.   Testing/Procedures: None   Follow-Up: At [facility], you and your health needs are our priority.  As part of our continuing mission to provide you with exceptional heart care, we have created designated Provider Care Teams.  These Care Teams include your primary Cardiologist (physician) and Advanced Practice Providers (APPs -  Physician Assistants and Nurse Practitioners) who all work together to provide you with the care you need, when you need it.  We recommend signing up for the patient portal called "MyChart".  Sign up information is provided on this After Visit Summary.  MyChart is used to connect with patients for Virtual Visits (Telemedicine).  Patients are able to view lab/test results, encounter notes, upcoming appointments, etc.  Non-urgent messages can be sent to your provider as well.   To learn more about what you can do with MyChart, go to [email or web address].    Your next appointment:   12 month(s)  The format for your next appointment:   In Person  Provider:   Rozann Lesches, MD or Carlyle Dolly, MD to establish.   Other Instructions Your physician encouraged you to lose weight for better health.   Calorie Counting for Weight Loss Calories are units of energy. Your body needs a certain number of calories from food to keep going throughout the day. When you eat or drink more calories than your body needs, your body stores the extra calories mostly as fat. When you eat or drink fewer calories than your body needs, your body burns fat to get the energy it  needs. Calorie counting means keeping track of how many calories you eat and drink each day.  Calorie counting can be helpful if you need to lose weight. If you eat fewer calories than your body needs, you should lose weight. Ask your health care provider what a healthy weight is for you. For calorie counting to work, you will need to eat the right number of calories each day to lose a healthy amount of weight per week. A dietitian can help you figure out how many calories you need in a day and will suggest ways to reach your calorie goal.  A healthy amount of weight to lose each week is usually 1-2 lb (0.5-0.9 kg). This usually means that your daily calorie intake should be reduced by 500-750 calories.  Eating 1,200-1,500 calories a day can help most women lose weight.  Eating 1,500-1,800 calories a day can help most men lose weight. What do I need to know about calorie counting? Work with your health care provider or dietitian to determine how many calories you should get each day. To meet your daily calorie goal, you will need to:  Find out how many calories are in each food that you would like to eat. Try to do this before you eat.  Decide how much of the food you plan to eat.  Keep a food log. Do this by writing down what you ate and how many calories it had. To successfully lose weight, it is important to balance calorie counting with a healthy lifestyle that includes regular activity. Where do I find calorie information? The number of calories in a food can be found on a Nutrition Facts label. If a food does not have a Nutrition Facts label, try to look up the calories online or ask your dietitian for help. Remember that calories are listed per serving. If you choose to have more than one serving of a food, you will have to multiply the calories per serving by the number of servings you plan to eat. For example, the label on a package of bread might say that a serving size is 1 slice and  that there are 90 calories in a serving. If you eat 1 slice, you will have eaten 90 calories. If you eat 2 slices, you will have eaten 180 calories.   How do I keep a food log? After each time that you eat, record the following in your food log as soon as possible:  What you ate. Be sure to include toppings, sauces, and other extras on the food.  How much you ate. This can be measured in cups, ounces, or number of items.  How many calories were in each food and drink.  The total number of calories in the food you ate. Keep your food log near you, such as in a pocket-sized notebook or on an app or website on your mobile phone. Some programs will calculate calories for you and show you how many calories you have left to meet your daily goal. What are some portion-control tips?  Know how many calories are in a serving. This will help you know how many servings you can have of a certain food.  Use a measuring cup to measure serving sizes. You could also try weighing out portions on a kitchen scale. With time, you will be able to estimate serving sizes for some foods.  Take time to put servings of different foods on your favorite plates or in your favorite bowls and cups so you know what a serving looks like.  Try not to eat straight from a  food's packaging, such as from a bag or box. Eating straight from the package makes it hard to see how much you are eating and can lead to overeating. Put the amount you would like to eat in a cup or on a plate to make sure you are eating the right portion.  Use smaller plates, glasses, and bowls for smaller portions and to prevent overeating.  Try not to multitask. For example, avoid watching TV or using your computer while eating. If it is time to eat, sit down at a table and enjoy your food. This will help you recognize when you are full. It will also help you be more mindful of what and how much you are eating. What are tips for following this  plan? Reading food labels  Check the calorie count compared with the serving size. The serving size may be smaller than what you are used to eating.  Check the source of the calories. Try to choose foods that are high in protein, fiber, and vitamins, and low in saturated fat, trans fat, and sodium. Shopping  Read nutrition labels while you shop. This will help you make healthy decisions about which foods to buy.  Pay attention to nutrition labels for low-fat or fat-free foods. These foods sometimes have the same number of calories or more calories than the full-fat versions. They also often have added sugar, starch, or salt to make up for flavor that was removed with the fat.  Make a grocery list of lower-calorie foods and stick to it. Cooking  Try to cook your favorite foods in a healthier way. For example, try baking instead of frying.  Use low-fat dairy products. Meal planning  Use more fruits and vegetables. One-half of your plate should be fruits and vegetables.  Include lean proteins, such as chicken, [place], and fish. Lifestyle Each week, aim to do one of the following:  150 minutes of moderate exercise, such as walking.  75 minutes of vigorous exercise, such as running. General information  Know how many calories are in the foods you eat most often. This will help you calculate calorie counts faster.  Find a way of tracking calories that works for you. Get creative. Try different apps or programs if writing down calories does not work for you. What foods should I eat?  Eat nutritious foods. It is better to have a nutritious, high-calorie food, such as an avocado, than a food with few nutrients, such as a bag of potato chips.  Use your calories on foods and drinks that will fill you up and will not leave you hungry soon after eating. ? Examples of foods that fill you up are nuts and nut butters, vegetables, lean proteins, and high-fiber foods such as whole grains.  High-fiber foods are foods with more than 5 g of fiber per serving.  Pay attention to calories in drinks. Low-calorie drinks include water and unsweetened drinks. The items listed above may not be a complete list of foods and beverages you can eat. Contact a dietitian for more information.   What foods should I limit? Limit foods or drinks that are not good sources of vitamins, minerals, or protein or that are high in unhealthy fats. These include:  Candy.  Other sweets.  Sodas, specialty coffee drinks, alcohol, and juice. The items listed above may not be a complete list of foods and beverages you should avoid. Contact a dietitian for more information. How do I count calories when eating out?  Pay  attention to portions. Often, portions are much larger when eating out. Try these tips to keep portions smaller: ? Consider sharing a meal instead of getting your own. ? If you get your own meal, eat only half of it. Before you start eating, ask for a container and put half of your meal into it. ? When available, consider ordering smaller portions from the menu instead of full portions.  Pay attention to your food and drink choices. Knowing the way food is cooked and what is included with the meal can help you eat fewer calories. ? If calories are listed on the menu, choose the lower-calorie options. ? Choose dishes that include vegetables, fruits, whole grains, low-fat dairy products, and lean proteins. ? Choose items that are boiled, broiled, grilled, or steamed. Avoid items that are buttered, battered, fried, or served with cream sauce. Items labeled as crispy are usually fried, unless stated otherwise. ? Choose water, low-fat milk, unsweetened iced tea, or other drinks without added sugar. If you want an alcoholic beverage, choose a lower-calorie option, such as a glass of wine or light beer. ? Ask for dressings, sauces, and syrups on the side. These are usually high in calories, so you should  limit the amount you eat. ? If you want a salad, choose a garden salad and ask for grilled meats. Avoid extra toppings such as bacon, cheese, or fried items. Ask for the dressing on the side, or ask for olive oil and vinegar or lemon to use as dressing.  Estimate how many servings of a food you are given. Knowing serving sizes will help you be aware of how much food you are eating at restaurants. Where to find more information  Centers for Disease Control and Prevention: http://www.wolf.info/  U.[place]. Department of Agriculture: http://www.wilson-mendoza.org/ Summary  Calorie counting means keeping track of how many calories you eat and drink each day. If you eat fewer calories than your body needs, you should lose weight.  A healthy amount of weight to lose per week is usually 1-2 lb (0.5-0.9 kg). This usually means reducing your daily calorie intake by 500-750 calories.  The number of calories in a food can be found on a Nutrition Facts label. If a food does not have a Nutrition Facts label, try to look up the calories online or ask your dietitian for help.  Use smaller plates, glasses, and bowls for smaller portions and to prevent overeating.  Use your calories on foods and drinks that will fill you up and not leave you hungry shortly after a meal. This information is not intended to replace advice given to you by your health care provider. Make sure you discuss any questions you have with your health care provider. Document Revised: 12/02/2019 Document Reviewed: 12/02/2019 Elsevier Patient Education  2021 Lewis, Ermalinda Barrios, [place]  01/15/2021 11:16 AM    [facility] [address], [place], [place]  [address] Phone: [phone]; Fax: [phone]

## 2021-01-03 DIAGNOSIS — J449 Chronic obstructive pulmonary disease, unspecified: Secondary | ICD-10-CM | POA: Diagnosis not present

## 2021-01-08 ENCOUNTER — Telehealth: Payer: Self-pay | Admitting: Pulmonary Disease

## 2021-01-08 NOTE — Telephone Encounter (Signed)
There is no clear evidence that testosterone replacement helps COPD.  If his primary care thinks that he needs to be in for other reasons then I think that is a good idea.

## 2021-01-08 NOTE — Telephone Encounter (Signed)
Called and spoke with patient. He verbalized understanding and thanked Dr. Vaughan Browner for taking the time to respond.   Nothing further needed at time of call.

## 2021-01-08 NOTE — Telephone Encounter (Signed)
Called and spoke with patient. He stated that he was on testosterone replacement therapy injections a few years ago but stopped. He spoke with his PCP about possibly restarting the injections since he had read online that testosterone injections tend to help male patients with COPD. He wanted to know if Dr. Vaughan Browner had heard this before and he recommends him restarting them.   Dr. Vaughan Browner, can you please advise? Thanks!

## 2021-01-15 ENCOUNTER — Other Ambulatory Visit: Payer: Self-pay

## 2021-01-15 ENCOUNTER — Ambulatory Visit (INDEPENDENT_AMBULATORY_CARE_PROVIDER_SITE_OTHER): Payer: Medicare Other | Admitting: Physician Assistant

## 2021-01-15 ENCOUNTER — Encounter: Payer: Self-pay | Admitting: Physician Assistant

## 2021-01-15 VITALS — BP 142/88 | HR 86 | Ht 67.0 in | Wt 262.2 lb

## 2021-01-15 DIAGNOSIS — I10 Essential (primary) hypertension: Secondary | ICD-10-CM

## 2021-01-15 DIAGNOSIS — I2729 Other secondary pulmonary hypertension: Secondary | ICD-10-CM

## 2021-01-15 DIAGNOSIS — J449 Chronic obstructive pulmonary disease, unspecified: Secondary | ICD-10-CM

## 2021-01-15 DIAGNOSIS — G4733 Obstructive sleep apnea (adult) (pediatric): Secondary | ICD-10-CM

## 2021-01-15 DIAGNOSIS — I50810 Right heart failure, unspecified: Secondary | ICD-10-CM

## 2021-01-15 DIAGNOSIS — Z9989 Dependence on other enabling machines and devices: Secondary | ICD-10-CM

## 2021-01-15 NOTE — Patient Instructions (Signed)
Medication Instructions:  Your physician recommends that you continue on your current medications as directed. Please refer to the Current Medication list given to you today.  *If you need a refill on your cardiac medications before your next appointment, please call your pharmacy*   Lab Work: None  If you have labs (blood work) drawn today and your tests are completely normal, you will receive your results only by: [name] MyChart Message (if you have MyChart) OR . A paper copy in the mail If you have any lab test that is abnormal or we need to change your treatment, we will call you to review the results.   Testing/Procedures: None   Follow-Up: At [facility], you and your health needs are our priority.  As part of our continuing mission to provide you with exceptional heart care, we have created designated Provider Care Teams.  These Care Teams include your primary Cardiologist (physician) and Advanced Practice Providers (APPs -  Physician Assistants and Nurse Practitioners) who all work together to provide you with the care you need, when you need it.  We recommend signing up for the patient portal called "MyChart".  Sign up information is provided on this After Visit Summary.  MyChart is used to connect with patients for Virtual Visits (Telemedicine).  Patients are able to view lab/test results, encounter notes, upcoming appointments, etc.  Non-urgent messages can be sent to your provider as well.   To learn more about what you can do with MyChart, go to [email or web address].    Your next appointment:   12 month(s)  The format for your next appointment:   In Person  Provider:   Rozann Lesches, MD or Carlyle Dolly, MD to establish.   Other Instructions Your physician encouraged you to lose weight for better health.   Calorie Counting for Weight Loss Calories are units of energy. Your body needs a certain number of calories from food to keep going throughout the day. When  you eat or drink more calories than your body needs, your body stores the extra calories mostly as fat. When you eat or drink fewer calories than your body needs, your body burns fat to get the energy it needs. Calorie counting means keeping track of how many calories you eat and drink each day. Calorie counting can be helpful if you need to lose weight. If you eat fewer calories than your body needs, you should lose weight. Ask your health care provider what a healthy weight is for you. For calorie counting to work, you will need to eat the right number of calories each day to lose a healthy amount of weight per week. A dietitian can help you figure out how many calories you need in a day and will suggest ways to reach your calorie goal.  A healthy amount of weight to lose each week is usually 1-2 lb (0.5-0.9 kg). This usually means that your daily calorie intake should be reduced by 500-750 calories.  Eating 1,200-1,500 calories a day can help most women lose weight.  Eating 1,500-1,800 calories a day can help most men lose weight. What do I need to know about calorie counting? Work with your health care provider or dietitian to determine how many calories you should get each day. To meet your daily calorie goal, you will need to:  Find out how many calories are in each food that you would like to eat. Try to do this before you eat.  Decide how much of the food you  plan to eat.  Keep a food log. Do this by writing down what you ate and how many calories it had. To successfully lose weight, it is important to balance calorie counting with a healthy lifestyle that includes regular activity. Where do I find calorie information? The number of calories in a food can be found on a Nutrition Facts label. If a food does not have a Nutrition Facts label, try to look up the calories online or ask your dietitian for help. Remember that calories are listed per serving. If you choose to have more than one  serving of a food, you will have to multiply the calories per serving by the number of servings you plan to eat. For example, the label on a package of bread might say that a serving size is 1 slice and that there are 90 calories in a serving. If you eat 1 slice, you will have eaten 90 calories. If you eat 2 slices, you will have eaten 180 calories.   How do I keep a food log? After each time that you eat, record the following in your food log as soon as possible:  What you ate. Be sure to include toppings, sauces, and other extras on the food.  How much you ate. This can be measured in cups, ounces, or number of items.  How many calories were in each food and drink.  The total number of calories in the food you ate. Keep your food log near you, such as in a pocket-sized notebook or on an app or website on your mobile phone. Some programs will calculate calories for you and show you how many calories you have left to meet your daily goal. What are some portion-control tips?  Know how many calories are in a serving. This will help you know how many servings you can have of a certain food.  Use a measuring cup to measure serving sizes. You could also try weighing out portions on a kitchen scale. With time, you will be able to estimate serving sizes for some foods.  Take time to put servings of different foods on your favorite plates or in your favorite bowls and cups so you know what a serving looks like.  Try not to eat straight from a food's packaging, such as from a bag or box. Eating straight from the package makes it hard to see how much you are eating and can lead to overeating. Put the amount you would like to eat in a cup or on a plate to make sure you are eating the right portion.  Use smaller plates, glasses, and bowls for smaller portions and to prevent overeating.  Try not to multitask. For example, avoid watching TV or using your computer while eating. If it is time to eat, sit  down at a table and enjoy your food. This will help you recognize when you are full. It will also help you be more mindful of what and how much you are eating. What are tips for following this plan? Reading food labels  Check the calorie count compared with the serving size. The serving size may be smaller than what you are used to eating.  Check the source of the calories. Try to choose foods that are high in protein, fiber, and vitamins, and low in saturated fat, trans fat, and sodium. Shopping  Read nutrition labels while you shop. This will help you make healthy decisions about which foods to buy.  Pay attention  to nutrition labels for low-fat or fat-free foods. These foods sometimes have the same number of calories or more calories than the full-fat versions. They also often have added sugar, starch, or salt to make up for flavor that was removed with the fat.  Make a grocery list of lower-calorie foods and stick to it. Cooking  Try to cook your favorite foods in a healthier way. For example, try baking instead of frying.  Use low-fat dairy products. Meal planning  Use more fruits and vegetables. One-half of your plate should be fruits and vegetables.  Include lean proteins, such as chicken, [place], and fish. Lifestyle Each week, aim to do one of the following:  150 minutes of moderate exercise, such as walking.  75 minutes of vigorous exercise, such as running. General information  Know how many calories are in the foods you eat most often. This will help you calculate calorie counts faster.  Find a way of tracking calories that works for you. Get creative. Try different apps or programs if writing down calories does not work for you. What foods should I eat?  Eat nutritious foods. It is better to have a nutritious, high-calorie food, such as an avocado, than a food with few nutrients, such as a bag of potato chips.  Use your calories on foods and drinks that will fill  you up and will not leave you hungry soon after eating. ? Examples of foods that fill you up are nuts and nut butters, vegetables, lean proteins, and high-fiber foods such as whole grains. High-fiber foods are foods with more than 5 g of fiber per serving.  Pay attention to calories in drinks. Low-calorie drinks include water and unsweetened drinks. The items listed above may not be a complete list of foods and beverages you can eat. Contact a dietitian for more information.   What foods should I limit? Limit foods or drinks that are not good sources of vitamins, minerals, or protein or that are high in unhealthy fats. These include:  Candy.  Other sweets.  Sodas, specialty coffee drinks, alcohol, and juice. The items listed above may not be a complete list of foods and beverages you should avoid. Contact a dietitian for more information. How do I count calories when eating out?  Pay attention to portions. Often, portions are much larger when eating out. Try these tips to keep portions smaller: ? Consider sharing a meal instead of getting your own. ? If you get your own meal, eat only half of it. Before you start eating, ask for a container and put half of your meal into it. ? When available, consider ordering smaller portions from the menu instead of full portions.  Pay attention to your food and drink choices. Knowing the way food is cooked and what is included with the meal can help you eat fewer calories. ? If calories are listed on the menu, choose the lower-calorie options. ? Choose dishes that include vegetables, fruits, whole grains, low-fat dairy products, and lean proteins. ? Choose items that are boiled, broiled, grilled, or steamed. Avoid items that are buttered, battered, fried, or served with cream sauce. Items labeled as crispy are usually fried, unless stated otherwise. ? Choose water, low-fat milk, unsweetened iced tea, or other drinks without added sugar. If you want an  alcoholic beverage, choose a lower-calorie option, such as a glass of wine or light beer. ? Ask for dressings, sauces, and syrups on the side. These are usually high in calories, so  you should limit the amount you eat. ? If you want a salad, choose a garden salad and ask for grilled meats. Avoid extra toppings such as bacon, cheese, or fried items. Ask for the dressing on the side, or ask for olive oil and vinegar or lemon to use as dressing.  Estimate how many servings of a food you are given. Knowing serving sizes will help you be aware of how much food you are eating at restaurants. Where to find more information  Centers for Disease Control and Prevention: http://www.wolf.info/  U.S. Department of Agriculture: http://www.wilson-mendoza.org/ Summary  Calorie counting means keeping track of how many calories you eat and drink each day. If you eat fewer calories than your body needs, you should lose weight.  A healthy amount of weight to lose per week is usually 1-2 lb (0.5-0.9 kg). This usually means reducing your daily calorie intake by 500-750 calories.  The number of calories in a food can be found on a Nutrition Facts label. If a food does not have a Nutrition Facts label, try to look up the calories online or ask your dietitian for help.  Use smaller plates, glasses, and bowls for smaller portions and to prevent overeating.  Use your calories on foods and drinks that will fill you up and not leave you hungry shortly after a meal. This information is not intended to replace advice given to you by your health care provider. Make sure you discuss any questions you have with your health care provider. Document Revised: 12/02/2019 Document Reviewed: 12/02/2019 Elsevier Patient Education  2021 [employer].

## 2021-01-24 ENCOUNTER — Encounter (INDEPENDENT_AMBULATORY_CARE_PROVIDER_SITE_OTHER): Payer: Self-pay | Admitting: Nurse Practitioner

## 2021-01-24 ENCOUNTER — Ambulatory Visit (INDEPENDENT_AMBULATORY_CARE_PROVIDER_SITE_OTHER): Payer: Medicare Other | Admitting: Nurse Practitioner

## 2021-01-24 ENCOUNTER — Other Ambulatory Visit: Payer: Self-pay

## 2021-01-24 VITALS — BP 124/74 | HR 77 | Temp 97.9°F | Ht 67.0 in | Wt 261.2 lb

## 2021-01-24 DIAGNOSIS — R5383 Other fatigue: Secondary | ICD-10-CM

## 2021-01-24 DIAGNOSIS — I10 Essential (primary) hypertension: Secondary | ICD-10-CM | POA: Diagnosis not present

## 2021-01-24 DIAGNOSIS — R7303 Prediabetes: Secondary | ICD-10-CM

## 2021-01-24 DIAGNOSIS — J449 Chronic obstructive pulmonary disease, unspecified: Secondary | ICD-10-CM

## 2021-01-24 DIAGNOSIS — E782 Mixed hyperlipidemia: Secondary | ICD-10-CM

## 2021-01-24 DIAGNOSIS — E559 Vitamin D deficiency, unspecified: Secondary | ICD-10-CM | POA: Diagnosis not present

## 2021-01-24 NOTE — Progress Notes (Signed)
Subjective:  Patient ID: Ricardo Burch, male    DOB: Oct 20, 1961  Age: 60 y.o. MRN: [id number]  CC:  Chief Complaint  Patient presents with  . Follow-up    Quit smoking, a little tired since stopped testosterone  . Fatigue  . COPD  . Hypertension  . Hyperlipidemia  . Other    Vitamin D deficiency  . Prediabetes      HPI  This patient arrives today for the above.  COPD: He continues to see his pulmonologist and continues on his chronic medications as prescribed.  Tells me is tolerating medication well he continues to abstain from cigarette smoking and has stopped smoking almost a whole year ago.  He tells me overall he feels that his breathing has actually improved since last time I saw him.  Fatigue: He reports that he has been feeling a bit more fatigued than usual.  He tells me it is mostly worse first thing in the morning, but as the day goes on his energy levels seem to improve.  He used to be on testosterone injections and tells me that he really felt the difference once his testosterone was stopped.  Hypertension: He continues on amlodipine 5 mg daily and tolerates this well.  Hyperlipidemia: Last lipid panel is as below and he is not on any medications currently.  Lipid Panel     Component Value Date/Time   CHOL 157 10/25/2020 0926   TRIG 64 10/25/2020 0926   HDL 56 10/25/2020 0926   CHOLHDL 2.8 10/25/2020 0926   VLDL 9 09/24/2018 0630   LDLCALC 86 10/25/2020 0926    Vitamin D deficiency: He continues on a vitamin D3 supplement of 10,000 IUs by mouth daily.  Last serum check was back in August and showed a level of 32.  Prediabetes: Last A1c was collected proximally 3 months ago at which point was 5.8.  He tells me that since his last office visit he had a [employer] representative cannot see his home to complete an assessment.  Tells me at that time they did a point-of-care hemoglobin A1c and it shows 5.0.  Past Medical History:  Diagnosis Date  . Allergy   .  CHF (congestive heart failure) ([place])   . Chronic kidney disease   . COPD (chronic obstructive pulmonary disease) ([place]) Dx 2015  . Depression   . Eczema    since childhood   . Erectile dysfunction 08/18/2019  . GERD (gastroesophageal reflux disease) 01/25/2020  . Hyperlipidemia 01/12/2018  . Hypertension   . Oxygen deficiency   . Screening for HIV (human immunodeficiency virus)   . Sleep apnea    CPAP  . Substance abuse ([place])    MJ, cocaine  . Testicular failure 07/22/2019      Family History  Problem Relation Age of Onset  . Arthritis Mother   . Hypertension Mother   . Stroke Mother        age 31  . Cerebral aneurysm Father   . Alcohol abuse Father   . Cancer Brother   . Diabetes Neg Hx   . Heart disease Neg Hx     Social History   Social History Narrative    Lives alone.    5 daughters 68, 71 yo twins, eldest 2 are married live in [place]. Retired.Girlfriend works in Armed forces logistics/support/administrative officer.   Social History   Tobacco Use  . Smoking status: Former Smoker    Packs/day: 1.00    Years: 38.00  Pack years: 38.00    Types: Cigarettes    Quit date: 02/21/2020    Years since quitting: 0.9  . Smokeless tobacco: Never Used  Substance Use Topics  . Alcohol use: Not Currently    Alcohol/week: 1.0 standard drink    Types: 1 Cans of beer per week    Comment: Socially x 1/month currently (as of 01/25/20); previously: occassionally: weekends, 6-pack or less on a day; or half pint of liquior     Current Meds  Medication Sig  . albuterol (VENTOLIN HFA) 108 (90 Base) MCG/ACT inhaler Inhale 1 or 2 puffs by mouth every 6 hours as needed for wheezing or shortness of breath  . amLODipine (NORVASC) 5 MG tablet Take 1 tablet (5 mg total) by mouth daily.  . Budeson-Glycopyrrol-Formoterol (BREZTRI AEROSPHERE) 160-9-4.8 MCG/ACT AERO Inhale 2 puffs into the lungs 2 (two) times daily.  . Cholecalciferol (VITAMIN D-3) 125 MCG (5000 UT) TABS Take 2 tablets by mouth daily.  [name] DALIRESP 500 MCG  TABS tablet TAKE 1 TABLET (500 MCG TOTAL) BY MOUTH DAILY.  . fluticasone (FLONASE) 50 MCG/ACT nasal spray INSTILL 2 SPRAYS IN EACH NOSTRIL TWICE DAILY  . ipratropium-albuterol (DUONEB) 0.5-2.5 (3) MG/3ML SOLN Take 3 mLs by nebulization every 4 (four) hours as needed.  . OXYGEN Inhale 3 L into the lungs daily. continuous    ROS:  Review of Systems  Constitutional: Positive for malaise/fatigue. Negative for weight loss.  Eyes: Negative for blurred vision.  Respiratory: Positive for shortness of breath. Negative for cough.   Cardiovascular: Negative for chest pain.  Musculoskeletal: Positive for myalgias.  Neurological: Negative for dizziness and headaches.     Objective:   Today's Vitals: BP 124/74   Pulse 77   Temp 97.9 F (36.6 C) (Temporal)   Ht 5' 7"  (1.702 m)   Wt 261 lb 3.2 oz (118.5 kg)   SpO2 91%   BMI 40.91 kg/m  Vitals with BMI 01/24/2021 01/15/2021 11/27/2020  Height 5' 7"  5' 7"  5' 7"   Weight 261 lbs 3 oz 262 lbs 3 oz 257 lbs 10 oz  BMI 40.9 62.22 97.98  Systolic 921 194 174  Diastolic 74 88 80  Pulse 77 86 75     Physical Exam Vitals reviewed.  Constitutional:      Appearance: Normal appearance.  HENT:     Head: Normocephalic and atraumatic.  Cardiovascular:     Rate and Rhythm: Normal rate and regular rhythm.  Pulmonary:     Effort: Pulmonary effort is normal.     Breath sounds: Normal breath sounds.  Musculoskeletal:     Cervical back: Neck supple.  Skin:    General: Skin is warm and dry.  Neurological:     Mental Status: He is alert and oriented to person, place, and time.  Psychiatric:        Mood and Affect: Mood normal.        Behavior: Behavior normal.        Thought Content: Thought content normal.        Judgment: Judgment normal.          Assessment and Plan   1. Vitamin D deficiency disease   2. Fatigue, unspecified type   3. Chronic obstructive pulmonary disease, unspecified COPD type ([place])   4. Primary hypertension   5.  Mixed hyperlipidemia   6. Pre-diabetes      Plan: 1.  We will check serum check today for further evaluation. 2.  We will check blood work  including testosterone levels, CBC, CMP, and TSH for further evaluation. 3.  He will continue to follow-up with his pulmonologist and continue taking his chronic medications as prescribed.  I congratulated him again on being able to abstain from smoking. 4.  He will continue on his amlodipine as currently prescribed his blood pressure is at goal. 5.  We will continue to monitor lipid panel periodically but we will not start him on medication for treatment of this right now. 6.  We will consider rechecking A1c at next office visit, however his A1c at home is reported within the normal range which is great.   Tests ordered Orders Placed This Encounter  Procedures  . Vitamin D, 25-hydroxy  . CMP with eGFR([facility])  . Testosterone Total,Free,Bio, Males  . TSH  . CBC with Differential/Platelets      No orders of the defined types were placed in this encounter.   Patient to follow-up in 3 months or sooner as needed based on lab results.  Ailene Ards, NP

## 2021-01-25 LAB — CBC WITH DIFFERENTIAL/PLATELET
Absolute Monocytes: 867 cells/uL (ref 200–950)
Basophils Absolute: 41 cells/uL (ref 0–200)
Basophils Relative: 0.5 %
Eosinophils Absolute: 97 cells/uL (ref 15–500)
Eosinophils Relative: 1.2 %
HCT: 45.1 % (ref 38.5–50.0)
Hemoglobin: 15.3 g/dL (ref 13.2–17.1)
Lymphs Abs: 2560 cells/uL (ref 850–3900)
MCH: 32.4 pg (ref 27.0–33.0)
MCHC: 33.9 g/dL (ref 32.0–36.0)
MCV: 95.6 fL (ref 80.0–100.0)
MPV: 10.2 fL (ref 7.5–12.5)
Monocytes Relative: 10.7 %
Neutro Abs: 4536 cells/uL (ref 1500–7800)
Neutrophils Relative %: 56 %
Platelets: 200 10*3/uL (ref 140–400)
RBC: 4.72 10*6/uL (ref 4.20–5.80)
RDW: 14 % (ref 11.0–15.0)
Total Lymphocyte: 31.6 %
WBC: 8.1 10*3/uL (ref 3.8–10.8)

## 2021-01-25 LAB — TSH: TSH: 0.93 mIU/L (ref 0.40–4.50)

## 2021-01-25 LAB — VITAMIN D 25 HYDROXY (VIT D DEFICIENCY, FRACTURES): Vit D, 25-Hydroxy: 28 ng/mL — ABNORMAL LOW (ref 30–100)

## 2021-01-25 LAB — TESTOSTERONE TOTAL,FREE,BIO, MALES
Albumin: 4.1 g/dL (ref 3.6–5.1)
Sex Hormone Binding: 16 nmol/L — ABNORMAL LOW (ref 22–77)
Testosterone: 208 ng/dL — ABNORMAL LOW (ref 250–827)

## 2021-01-25 LAB — COMPLETE METABOLIC PANEL WITH GFR
AG Ratio: 1.4 (calc) (ref 1.0–2.5)
ALT: 18 U/L (ref 9–46)
AST: 17 U/L (ref 10–35)
Albumin: 4.1 g/dL (ref 3.6–5.1)
Alkaline phosphatase (APISO): 84 U/L (ref 35–144)
BUN: 17 mg/dL (ref 7–25)
CO2: 33 mmol/L — ABNORMAL HIGH (ref 20–32)
Calcium: 9 mg/dL (ref 8.6–10.3)
Chloride: 101 mmol/L (ref 98–110)
Creat: 0.98 mg/dL (ref 0.70–1.33)
GFR, Est African American: 99 mL/min/{1.73_m2} (ref >=60)
GFR, Est Non African American: 85 mL/min/{1.73_m2} (ref >=60)
Globulin: 3 g/dL (calc) (ref 1.9–3.7)
Glucose, Bld: 81 mg/dL (ref 65–99)
Potassium: 4.6 mmol/L (ref 3.5–5.3)
Sodium: 139 mmol/L (ref 135–146)
Total Bilirubin: 1.2 mg/dL (ref 0.2–1.2)
Total Protein: 7.1 g/dL (ref 6.1–8.1)

## 2021-02-03 DIAGNOSIS — J449 Chronic obstructive pulmonary disease, unspecified: Secondary | ICD-10-CM | POA: Diagnosis not present

## 2021-02-05 ENCOUNTER — Ambulatory Visit (INDEPENDENT_AMBULATORY_CARE_PROVIDER_SITE_OTHER): Payer: Medicare Other | Admitting: Internal Medicine

## 2021-02-05 ENCOUNTER — Encounter (INDEPENDENT_AMBULATORY_CARE_PROVIDER_SITE_OTHER): Payer: Self-pay | Admitting: Internal Medicine

## 2021-02-05 ENCOUNTER — Other Ambulatory Visit: Payer: Self-pay

## 2021-02-05 VITALS — BP 118/78 | HR 79 | Temp 97.7°F | Ht 67.0 in | Wt 265.0 lb

## 2021-02-05 DIAGNOSIS — E291 Testicular hypofunction: Secondary | ICD-10-CM | POA: Diagnosis not present

## 2021-02-05 DIAGNOSIS — J449 Chronic obstructive pulmonary disease, unspecified: Secondary | ICD-10-CM | POA: Diagnosis not present

## 2021-02-05 NOTE — Progress Notes (Signed)
Metrics: Intervention Frequency ACO  Documented Smoking Status Yearly  Screened one or more times in 24 months  Cessation Counseling or  Active cessation medication Past 24 months  Past 24 months   Guideline developer: UpToDate (See UpToDate for funding source) Date Released: 2014       Wellness Office Visit  Subjective:  Patient ID: Ricardo Burch, male    DOB: [birth year]  Age: 60 y.o. MRN: [id number]  CC: Follow-up of hypogonadism. HPI  This patient had discontinue testosterone therapy because he felt that it was making him more short of breath.  Once he discontinued it, he did not really feel that it was the testosterone that was the culprit.  He now feels better in general with his breathing and he felt that at that time, he was going through perhaps some other issues that were causing him to be more short of breath.  He now wants to restart testosterone therapy.  He is blood work more recently in the last 12 days showed low levels as I would expect.  He certainly felt much better on testosterone therapy previously.  He still has some testosterone at home. Past Medical History:  Diagnosis Date  . Allergy   . CHF (congestive heart failure) ([place])   . Chronic kidney disease   . COPD (chronic obstructive pulmonary disease) ([place]) Dx 2015  . Depression   . Eczema    since childhood   . Erectile dysfunction 08/18/2019  . GERD (gastroesophageal reflux disease) 01/25/2020  . Hyperlipidemia 01/12/2018  . Hypertension   . Oxygen deficiency   . Screening for HIV (human immunodeficiency virus)   . Sleep apnea    CPAP  . Substance abuse ([place])    MJ, cocaine  . Testicular failure 07/22/2019   Past Surgical History:  Procedure Laterality Date  . COLONOSCOPY WITH PROPOFOL N/A 06/25/2017   Procedure: COLONOSCOPY WITH PROPOFOL;  Surgeon: Daneil Dolin, MD;  [facility];  Service: Endoscopy;  Laterality: N/A;  2:00pm  . MULTIPLE EXTRACTIONS WITH ALVEOLOPLASTY N/A 03/22/2016    Procedure: MULTIPLE EXTRACTION WITH ALVEOLOPLASTY;  Surgeon: Diona Browner, DDS;  Location: [place] [facility];  Service: Oral Surgery;  Laterality: N/A;  . NO PAST SURGERIES    . POLYPECTOMY  06/25/2017   Procedure: POLYPECTOMY;  Surgeon: Daneil Dolin, MD;  Location[facility];  Service: Endoscopy;;  colon     Family History  Problem Relation Age of Onset  . Arthritis Mother   . Hypertension Mother   . Stroke Mother        age 68  . Cerebral aneurysm Father   . Alcohol abuse Father   . Cancer Brother   . Diabetes Neg Hx   . Heart disease Neg Hx     Social History   Social History Narrative    Lives alone.    5 daughters 62, 41 yo twins, eldest 2 are married live in [place]. Retired.Girlfriend works in Armed forces logistics/support/administrative officer.   Social History   Tobacco Use  . Smoking status: Former Smoker    Packs/day: 1.00    Years: 38.00    Pack years: 38.00    Types: Cigarettes    Quit date: 02/21/2020    Years since quitting: 0.9  . Smokeless tobacco: Never Used  Substance Use Topics  . Alcohol use: Not Currently    Alcohol/week: 1.0 standard drink    Types: 1 Cans of beer per week    Comment: Socially x 1/month currently (as of  01/25/20); previously: occassionally: weekends, 6-pack or less on a day; or half pint of liquior    Current Meds  Medication Sig  . albuterol (VENTOLIN HFA) 108 (90 Base) MCG/ACT inhaler Inhale 1 or 2 puffs by mouth every 6 hours as needed for wheezing or shortness of breath  . amLODipine (NORVASC) 5 MG tablet Take 1 tablet (5 mg total) by mouth daily.  . Budeson-Glycopyrrol-Formoterol (BREZTRI AEROSPHERE) 160-9-4.8 MCG/ACT AERO Inhale 2 puffs into the lungs 2 (two) times daily.  . Cholecalciferol (VITAMIN D-3) 125 MCG (5000 UT) TABS Take 2 tablets by mouth daily.  [name] DALIRESP 500 MCG TABS tablet TAKE 1 TABLET (500 MCG TOTAL) BY MOUTH DAILY.  . fluticasone (FLONASE) 50 MCG/ACT nasal spray INSTILL 2 SPRAYS IN EACH NOSTRIL TWICE DAILY  . ipratropium-albuterol (DUONEB)  0.5-2.5 (3) MG/3ML SOLN Take 3 mLs by nebulization every 4 (four) hours as needed.  . OXYGEN Inhale 3 L into the lungs daily. continuous     [place] Office Visit from 01/24/2021 in [place]  PHQ-9 Total Score 0      Objective:   Today's Vitals: BP 118/78   Pulse 79   Temp 97.7 F (36.5 C) (Temporal)   Ht 5\' 7"  (1.702 m)   Wt 265 lb (120.2 kg)   SpO2 92%   BMI 41.50 kg/m  Vitals with BMI 02/05/2021 01/24/2021 01/15/2021  Height 5\' 7"  5\' 7"  5\' 7"   Weight 265 lbs 261 lbs 3 oz 262 lbs 3 oz  BMI 41.5 16.1 09.60  Systolic 454 098 119  Diastolic 78 74 88  Pulse 79 77 86     Physical Exam  He looks systemically well but he has gained significant weight since the last time I saw him.     Assessment   1. Testicular failure   2. Obstructive chronic bronchitis without exacerbation ([place])       Tests ordered No orders of the defined types were placed in this encounter.    Plan: 1. He will go back to taking testosterone 100 mg intramuscular once a week.  He usually comes to the office for these injections. 2. Follow-up as scheduled at the end of June with me.   No orders of the defined types were placed in this encounter.   Doree Albee, MD

## 2021-02-14 ENCOUNTER — Other Ambulatory Visit: Payer: Self-pay

## 2021-02-14 ENCOUNTER — Ambulatory Visit (INDEPENDENT_AMBULATORY_CARE_PROVIDER_SITE_OTHER): Payer: Medicare Other | Admitting: Nurse Practitioner

## 2021-02-14 VITALS — HR 81 | Ht 67.0 in | Wt 264.4 lb

## 2021-02-14 DIAGNOSIS — R5383 Other fatigue: Secondary | ICD-10-CM

## 2021-02-14 DIAGNOSIS — E291 Testicular hypofunction: Secondary | ICD-10-CM

## 2021-02-14 MED ORDER — TESTOSTERONE CYPIONATE 200 MG/ML IM SOLN
100.0000 mg | Freq: Once | INTRAMUSCULAR | Status: AC
  Administered 2021-02-14: 100 mg via INTRAMUSCULAR

## 2021-02-14 NOTE — Progress Notes (Cosign Needed)
Patient came in for his first testosterone injection since November 2021 and was given 0.5 ml IM into Right thigh muscle. Patient tolerated well.

## 2021-02-15 ENCOUNTER — Telehealth (INDEPENDENT_AMBULATORY_CARE_PROVIDER_SITE_OTHER): Payer: Self-pay

## 2021-02-15 NOTE — Telephone Encounter (Signed)
Patient returning call. Call back # [phone]

## 2021-02-15 NOTE — Telephone Encounter (Signed)
Ok, thank you

## 2021-02-21 ENCOUNTER — Telehealth: Payer: Self-pay | Admitting: Pulmonary Disease

## 2021-02-21 ENCOUNTER — Ambulatory Visit (INDEPENDENT_AMBULATORY_CARE_PROVIDER_SITE_OTHER): Payer: Medicare Other

## 2021-02-21 ENCOUNTER — Other Ambulatory Visit: Payer: Self-pay

## 2021-02-21 VITALS — HR 88 | Resp 18 | Ht 67.0 in | Wt 268.0 lb

## 2021-02-21 DIAGNOSIS — E291 Testicular hypofunction: Secondary | ICD-10-CM

## 2021-02-21 DIAGNOSIS — R5383 Other fatigue: Secondary | ICD-10-CM

## 2021-02-21 DIAGNOSIS — F524 Premature ejaculation: Secondary | ICD-10-CM

## 2021-02-21 MED ORDER — TESTOSTERONE CYPIONATE 200 MG/ML IM SOLN
100.0000 mg | INTRAMUSCULAR | Status: DC
  Administered 2021-03-07 – 2021-03-14 (×2): 100 mg via INTRAMUSCULAR

## 2021-02-21 NOTE — Progress Notes (Signed)
Pt was given 0.54ml to the Rt thigh of Testosterone. Pt tolerated well; no complaints. Applied band-aid. Will be back in 7 days for next dose; weekly.

## 2021-02-21 NOTE — Telephone Encounter (Signed)
Called and spoke to pt. Pt is questioning since his COPD has been stable if he should come off of Daliresp. Advised pt that the [place] may be contributing to his stability. Informed pt of the reason for taking Daliresp, answered pt's questions. Pt still questioning if he needs to be on med. Advised pt that it was something that he would need to discuss with Dr. Vaughan Browner. Appt made with Dr. Vaughan Browner for 05/03/2021 (pt due for OV in late July). Pt verbalized understanding and denied any further questions or concerns at this time.

## 2021-02-23 ENCOUNTER — Other Ambulatory Visit: Payer: Self-pay | Admitting: Orthopedic Surgery

## 2021-02-23 ENCOUNTER — Telehealth: Payer: Self-pay

## 2021-02-23 MED ORDER — NAPROXEN 500 MG PO TABS: 500.0000 mg | ORAL_TABLET | Freq: Two times a day (BID) | ORAL | 2 refills | Status: DC

## 2021-02-23 NOTE — Telephone Encounter (Signed)
Patient called asking if there was something you could give him for pain. Stated that he has been using Aleve, Ibuprofen, rubs and ice with no relief.  PATIENT USES [facility]  He has an appointment on 03/08/21 with you.

## 2021-02-23 NOTE — Progress Notes (Signed)
Meds ordered this encounter  Medications  . naproxen (NAPROSYN) 500 MG tablet    Sig: Take 1 tablet (500 mg total) by mouth 2 (two) times daily with a meal.    Dispense:  60 tablet    Refill:  2

## 2021-02-26 NOTE — Telephone Encounter (Signed)
Patient aware.

## 2021-02-26 NOTE — Telephone Encounter (Signed)
Wait till may 5

## 2021-02-27 ENCOUNTER — Other Ambulatory Visit: Payer: Self-pay | Admitting: *Deleted

## 2021-02-27 DIAGNOSIS — J438 Other emphysema: Secondary | ICD-10-CM

## 2021-02-27 MED ORDER — DALIRESP 500 MCG PO TABS: 500.0000 ug | ORAL_TABLET | Freq: Every day | ORAL | 5 refills | Status: DC

## 2021-02-28 ENCOUNTER — Encounter (INDEPENDENT_AMBULATORY_CARE_PROVIDER_SITE_OTHER): Payer: Self-pay

## 2021-02-28 ENCOUNTER — Ambulatory Visit (INDEPENDENT_AMBULATORY_CARE_PROVIDER_SITE_OTHER): Payer: Medicare Other

## 2021-02-28 ENCOUNTER — Other Ambulatory Visit: Payer: Self-pay

## 2021-02-28 ENCOUNTER — Ambulatory Visit: Payer: Medicare Other | Admitting: Orthopedic Surgery

## 2021-02-28 VITALS — HR 91 | Resp 18 | Ht 67.0 in | Wt 268.0 lb

## 2021-02-28 DIAGNOSIS — F524 Premature ejaculation: Secondary | ICD-10-CM

## 2021-02-28 DIAGNOSIS — R5383 Other fatigue: Secondary | ICD-10-CM

## 2021-02-28 DIAGNOSIS — E291 Testicular hypofunction: Secondary | ICD-10-CM

## 2021-02-28 MED ORDER — TESTOSTERONE CYPIONATE 200 MG/ML IM SOLN: 100.0000 mg | INTRAMUSCULAR | Status: DC

## 2021-02-28 NOTE — Progress Notes (Signed)
Pt is given 0.59ml to the rt thigh. Pt tolerated well. No complaints. Pt will be back next week for next weekly dose,

## 2021-03-05 DIAGNOSIS — J449 Chronic obstructive pulmonary disease, unspecified: Secondary | ICD-10-CM | POA: Diagnosis not present

## 2021-03-07 ENCOUNTER — Ambulatory Visit (INDEPENDENT_AMBULATORY_CARE_PROVIDER_SITE_OTHER): Payer: Medicare Other

## 2021-03-07 ENCOUNTER — Other Ambulatory Visit: Payer: Self-pay

## 2021-03-07 ENCOUNTER — Encounter (INDEPENDENT_AMBULATORY_CARE_PROVIDER_SITE_OTHER): Payer: Self-pay

## 2021-03-07 VITALS — Resp 17 | Ht 67.0 in | Wt 266.0 lb

## 2021-03-07 DIAGNOSIS — E291 Testicular hypofunction: Secondary | ICD-10-CM | POA: Diagnosis not present

## 2021-03-07 DIAGNOSIS — R5383 Other fatigue: Secondary | ICD-10-CM

## 2021-03-07 DIAGNOSIS — F524 Premature ejaculation: Secondary | ICD-10-CM | POA: Diagnosis not present

## 2021-03-07 DIAGNOSIS — N5201 Erectile dysfunction due to arterial insufficiency: Secondary | ICD-10-CM

## 2021-03-07 NOTE — Progress Notes (Signed)
Pt was  Given 0.52ml Testosterone weekly dose. Pt IM in the left thigh. Pt tolerated well.

## 2021-03-08 ENCOUNTER — Encounter: Payer: Self-pay | Admitting: Orthopedic Surgery

## 2021-03-08 ENCOUNTER — Ambulatory Visit: Payer: Medicare Other

## 2021-03-08 ENCOUNTER — Ambulatory Visit (INDEPENDENT_AMBULATORY_CARE_PROVIDER_SITE_OTHER): Payer: Medicare Other | Admitting: Orthopedic Surgery

## 2021-03-08 VITALS — BP 150/80 | HR 82 | Ht 67.0 in | Wt 266.0 lb

## 2021-03-08 DIAGNOSIS — M2341 Loose body in knee, right knee: Secondary | ICD-10-CM | POA: Diagnosis not present

## 2021-03-08 DIAGNOSIS — M1711 Unilateral primary osteoarthritis, right knee: Secondary | ICD-10-CM | POA: Diagnosis not present

## 2021-03-08 DIAGNOSIS — G8929 Other chronic pain: Secondary | ICD-10-CM

## 2021-03-08 MED ORDER — HYDROCODONE-ACETAMINOPHEN 5-325 MG PO TABS: 1.0000 | ORAL_TABLET | Freq: Four times a day (QID) | ORAL | 0 refills | Status: AC | PRN

## 2021-03-08 NOTE — Progress Notes (Signed)
Patient requesting something stronger for pain.

## 2021-03-08 NOTE — Progress Notes (Signed)
FOLLOW-UP OFFICE VISIT   Encounter Diagnosis  Name Primary?  . Chronic pain of right knee Yes    60 year old male followed for knee effusions arthritis complains of pain when it rains or gets cold.  He denies any catching or locking.  He has been on 2 anti-inflammatories and has been on some Tylenol 3 when he had his tooth pulled he said none of it relieves his knee pain he would like something stronger  (and prior treatment)  + EXAM FINDINGS:   No swelling nontender mild varus alignment to the knee Knee feels stable   ASSESSMENT AND PLAN X-rays show a loose body it seems to be a chronic loose body and seems to be asymptomatic  He will try some hydrocodone for knee pain if that works he is agreeable to referral to pain management  Meds ordered this encounter  Medications  . HYDROcodone-acetaminophen (NORCO/VICODIN) 5-325 MG tablet    Sig: Take 1 tablet by mouth every 6 (six) hours as needed for up to 5 days for moderate pain.    Dispense:  20 tablet    Refill:  0

## 2021-03-14 ENCOUNTER — Telehealth (INDEPENDENT_AMBULATORY_CARE_PROVIDER_SITE_OTHER): Payer: Self-pay

## 2021-03-14 ENCOUNTER — Other Ambulatory Visit: Payer: Self-pay

## 2021-03-14 ENCOUNTER — Ambulatory Visit (INDEPENDENT_AMBULATORY_CARE_PROVIDER_SITE_OTHER): Payer: Medicare Other

## 2021-03-14 ENCOUNTER — Other Ambulatory Visit (INDEPENDENT_AMBULATORY_CARE_PROVIDER_SITE_OTHER): Payer: Self-pay | Admitting: Internal Medicine

## 2021-03-14 VITALS — Temp 97.6°F | Resp 18 | Ht 67.0 in | Wt 264.0 lb

## 2021-03-14 DIAGNOSIS — N5201 Erectile dysfunction due to arterial insufficiency: Secondary | ICD-10-CM

## 2021-03-14 DIAGNOSIS — E291 Testicular hypofunction: Secondary | ICD-10-CM | POA: Diagnosis not present

## 2021-03-14 DIAGNOSIS — F524 Premature ejaculation: Secondary | ICD-10-CM

## 2021-03-14 DIAGNOSIS — R5383 Other fatigue: Secondary | ICD-10-CM

## 2021-03-14 MED ORDER — TESTOSTERONE CYPIONATE 200 MG/ML IM SOLN: 100.0000 mg | INTRAMUSCULAR | 3 refills | Status: DC

## 2021-03-14 NOTE — Telephone Encounter (Signed)
I have put a new order through [facility] mail order now.

## 2021-03-14 NOTE — Progress Notes (Signed)
Pt came into office today for testosterone injection. Admister 0.92ml IM to rt thigh. Pt tolerated well. Applied bandage. Pt will need new RX for medication for new refills.

## 2021-03-19 DIAGNOSIS — G4733 Obstructive sleep apnea (adult) (pediatric): Secondary | ICD-10-CM | POA: Diagnosis not present

## 2021-03-19 DIAGNOSIS — J449 Chronic obstructive pulmonary disease, unspecified: Secondary | ICD-10-CM | POA: Diagnosis not present

## 2021-03-21 ENCOUNTER — Other Ambulatory Visit: Payer: Self-pay

## 2021-03-21 ENCOUNTER — Other Ambulatory Visit (INDEPENDENT_AMBULATORY_CARE_PROVIDER_SITE_OTHER): Payer: Self-pay | Admitting: Internal Medicine

## 2021-03-21 ENCOUNTER — Ambulatory Visit (INDEPENDENT_AMBULATORY_CARE_PROVIDER_SITE_OTHER): Payer: Medicare Other

## 2021-03-21 VITALS — BP 130/77 | HR 84 | Resp 18 | Ht 67.0 in | Wt 266.0 lb

## 2021-03-21 DIAGNOSIS — N5201 Erectile dysfunction due to arterial insufficiency: Secondary | ICD-10-CM

## 2021-03-21 DIAGNOSIS — R5383 Other fatigue: Secondary | ICD-10-CM

## 2021-03-21 DIAGNOSIS — F524 Premature ejaculation: Secondary | ICD-10-CM

## 2021-03-21 DIAGNOSIS — E291 Testicular hypofunction: Secondary | ICD-10-CM

## 2021-03-21 MED ORDER — TESTOSTERONE CYPIONATE 200 MG/ML IM SOLN: 100.0000 mg | INTRAMUSCULAR | 3 refills | Status: DC

## 2021-03-21 MED ORDER — TESTOSTERONE CYPIONATE 200 MG/ML IM SOLN
100.0000 mg | INTRAMUSCULAR | Status: DC
  Administered 2021-03-28 – 2021-04-25 (×5): 100 mg via INTRAMUSCULAR

## 2021-03-21 NOTE — Progress Notes (Signed)
Pt came today to have a weekly testosterone injection.Inject of 0.23mL IM to the rt thigh. Pt tolerated well; no complaints. Band-aid applied.  Pt is on last bottle. Will need a new RX called into [facility].

## 2021-03-28 ENCOUNTER — Encounter (INDEPENDENT_AMBULATORY_CARE_PROVIDER_SITE_OTHER): Payer: Self-pay | Admitting: Internal Medicine

## 2021-03-28 ENCOUNTER — Ambulatory Visit (INDEPENDENT_AMBULATORY_CARE_PROVIDER_SITE_OTHER): Payer: Medicare Other

## 2021-03-28 ENCOUNTER — Ambulatory Visit (INDEPENDENT_AMBULATORY_CARE_PROVIDER_SITE_OTHER): Payer: Medicare Other | Admitting: Internal Medicine

## 2021-03-28 ENCOUNTER — Other Ambulatory Visit: Payer: Self-pay

## 2021-03-28 VITALS — BP 133/70 | HR 86 | Temp 97.9°F | Resp 18 | Ht 67.0 in | Wt 266.8 lb

## 2021-03-28 DIAGNOSIS — F524 Premature ejaculation: Secondary | ICD-10-CM

## 2021-03-28 DIAGNOSIS — H938X2 Other specified disorders of left ear: Secondary | ICD-10-CM

## 2021-03-28 DIAGNOSIS — J019 Acute sinusitis, unspecified: Secondary | ICD-10-CM | POA: Diagnosis not present

## 2021-03-28 DIAGNOSIS — E291 Testicular hypofunction: Secondary | ICD-10-CM | POA: Diagnosis not present

## 2021-03-28 DIAGNOSIS — N5201 Erectile dysfunction due to arterial insufficiency: Secondary | ICD-10-CM

## 2021-03-28 DIAGNOSIS — R5383 Other fatigue: Secondary | ICD-10-CM

## 2021-03-28 MED ORDER — AMOXICILLIN-POT CLAVULANATE 875-125 MG PO TABS: 1.0000 | ORAL_TABLET | Freq: Two times a day (BID) | ORAL | 0 refills | Status: DC

## 2021-03-28 NOTE — Progress Notes (Signed)
Metrics: Intervention Frequency ACO  Documented Smoking Status Yearly  Screened one or more times in 24 months  Cessation Counseling or  Active cessation medication Past 24 months  Past 24 months   Guideline developer: UpToDate (See UpToDate for funding source) Date Released: 2014       Wellness Office Visit  Subjective:  Patient ID: Ricardo Burch, male    DOB: October 10, 1961  Age: 60 y.o. MRN: [id number]  CC: Left ear congestion, facial discomfort, postnasal drainage. HPI  The patient describes the above symptoms for the last 3 days.  He denies any fever, dyspnea, cough or wheezing.  He has no myalgia.  He has no loss of taste or smell. Past Medical History:  Diagnosis Date  . Allergy   . CHF (congestive heart failure) ([place])   . Chronic kidney disease   . COPD (chronic obstructive pulmonary disease) ([place]) Dx 2015  . Depression   . Eczema    since childhood   . Erectile dysfunction 08/18/2019  . GERD (gastroesophageal reflux disease) 01/25/2020  . Hyperlipidemia 01/12/2018  . Hypertension   . Oxygen deficiency   . Screening for HIV (human immunodeficiency virus)   . Sleep apnea    CPAP  . Substance abuse ([place])    MJ, cocaine  . Testicular failure 07/22/2019   Past Surgical History:  Procedure Laterality Date  . COLONOSCOPY WITH PROPOFOL N/A 06/25/2017   Procedure: COLONOSCOPY WITH PROPOFOL;  Surgeon: Daneil Dolin, MD;  [facility];  Service: Endoscopy;  Laterality: N/A;  2:00pm  . MULTIPLE EXTRACTIONS WITH ALVEOLOPLASTY N/A 03/22/2016   Procedure: MULTIPLE EXTRACTION WITH ALVEOLOPLASTY;  Surgeon: Diona Browner, DDS;  Location: [facility];  Service: Oral Surgery;  Laterality: N/A;  . NO PAST SURGERIES    . POLYPECTOMY  06/25/2017   Procedure: POLYPECTOMY;  Surgeon: Daneil Dolin, MD;  [facility];  Service: Endoscopy;;  colon     Family History  Problem Relation Age of Onset  . Arthritis Mother   . Hypertension Mother   . Stroke Mother        age 90   . Cerebral aneurysm Father   . Alcohol abuse Father   . Cancer Brother   . Diabetes Neg Hx   . Heart disease Neg Hx     Social History   Social History Narrative    Lives alone.    5 daughters 58, 32 yo twins, eldest 2 are married live in [place]. Retired.Girlfriend works in Armed forces logistics/support/administrative officer.   Social History   Tobacco Use  . Smoking status: Former Smoker    Packs/day: 1.00    Years: 38.00    Pack years: 38.00    Types: Cigarettes    Quit date: 02/21/2020    Years since quitting: 1.0  . Smokeless tobacco: Never Used  Substance Use Topics  . Alcohol use: Not Currently    Alcohol/week: 1.0 standard drink    Types: 1 Cans of beer per week    Comment: Socially x 1/month currently (as of 01/25/20); previously: occassionally: weekends, 6-pack or less on a day; or half pint of liquior    Current Meds  Medication Sig  . albuterol (VENTOLIN HFA) 108 (90 Base) MCG/ACT inhaler Inhale 1 or 2 puffs by mouth every 6 hours as needed for wheezing or shortness of breath  . amLODipine (NORVASC) 5 MG tablet Take 1 tablet (5 mg total) by mouth daily.  [name] amoxicillin-clavulanate (AUGMENTIN) 875-125 MG tablet Take 1 tablet by mouth  2 (two) times daily.  . Budeson-Glycopyrrol-Formoterol (BREZTRI AEROSPHERE) 160-9-4.8 MCG/ACT AERO Inhale 2 puffs into the lungs 2 (two) times daily.  . Cholecalciferol (VITAMIN D-3) 125 MCG (5000 UT) TABS Take 2 tablets by mouth daily.  . fluticasone (FLONASE) 50 MCG/ACT nasal spray INSTILL 2 SPRAYS IN EACH NOSTRIL TWICE DAILY  . ipratropium-albuterol (DUONEB) 0.5-2.5 (3) MG/3ML SOLN Take 3 mLs by nebulization every 4 (four) hours as needed.  . naproxen (NAPROSYN) 500 MG tablet Take 1 tablet (500 mg total) by mouth 2 (two) times daily with a meal.  . OXYGEN Inhale 3 L into the lungs daily. continuous  . roflumilast (DALIRESP) 500 MCG TABS tablet Take 1 tablet (500 mcg total) by mouth daily.  [name] testosterone cypionate (DEPO-TESTOSTERONE) 200 MG/ML injection Inject 0.5  mLs (100 mg total) into the muscle every 7 (seven) days.   Current Facility-Administered Medications for the 03/28/21 encounter (Office Visit) with Doree Albee, MD  Medication  . testosterone cypionate (DEPOTESTOSTERONE CYPIONATE) injection 100 mg     [facility] Visit from 01/24/2021 in [facility]  PHQ-9 Total Score 0      Objective:   Today's Vitals: BP 133/70 (BP Location: Left Arm, Patient Position: Sitting, Cuff Size: Normal)   Pulse 86   Temp 97.9 F (36.6 C) (Temporal)   Resp 18   Ht 5\' 7"  (1.702 m)   Wt 266 lb 12.8 oz (121 kg)   SpO2 91%   BMI 41.79 kg/m  Vitals with BMI 03/28/2021 03/21/2021 03/14/2021  Height 5\' 7"  5\' 7"  5\' 7"   Weight 266 lbs 13 oz 266 lbs 264 lbs  BMI 41.78 01.75 10.25  Systolic 852 778 -  Diastolic 70 77 -  Pulse 86 84 -     Physical Exam  Look systemically well.  He is afebrile.  There is no sinus tenderness.  Lung fields are clear.  Left tympanic membrane does look somewhat cloudy.     Assessment   1. Acute non-recurrent sinusitis, unspecified location   2. Congestion of left ear       Tests ordered No orders of the defined types were placed in this encounter.    Plan: 1. I will treat him with antibiotics for possible otitis media on the left side.  However, I have highly recommended that he does get a COVID-19 test as the Omicron  variant seems to be presenting in this fashion. 2. Follow-up as scheduled at the end of June.   Meds ordered this encounter  Medications  . amoxicillin-clavulanate (AUGMENTIN) 875-125 MG tablet    Sig: Take 1 tablet by mouth 2 (two) times daily.    Dispense:  20 tablet    Refill:  0    Mitsugi Schrader Luther Parody, MD

## 2021-03-28 NOTE — Progress Notes (Signed)
Pt was given 5 mL,IM to the left thigh. Pt tolerated well; no complaints.

## 2021-04-04 ENCOUNTER — Other Ambulatory Visit: Payer: Self-pay

## 2021-04-04 ENCOUNTER — Ambulatory Visit (INDEPENDENT_AMBULATORY_CARE_PROVIDER_SITE_OTHER): Payer: Medicare Other

## 2021-04-04 VITALS — Resp 18 | Ht 67.0 in | Wt 268.0 lb

## 2021-04-04 DIAGNOSIS — N5201 Erectile dysfunction due to arterial insufficiency: Secondary | ICD-10-CM | POA: Diagnosis not present

## 2021-04-04 DIAGNOSIS — E291 Testicular hypofunction: Secondary | ICD-10-CM

## 2021-04-04 DIAGNOSIS — R5383 Other fatigue: Secondary | ICD-10-CM

## 2021-04-04 DIAGNOSIS — F524 Premature ejaculation: Secondary | ICD-10-CM

## 2021-04-04 NOTE — Progress Notes (Signed)
Pt given 0.17mL , IM to the Lft thigh. Pt tolerated well.

## 2021-04-05 DIAGNOSIS — J449 Chronic obstructive pulmonary disease, unspecified: Secondary | ICD-10-CM | POA: Diagnosis not present

## 2021-04-11 ENCOUNTER — Ambulatory Visit (INDEPENDENT_AMBULATORY_CARE_PROVIDER_SITE_OTHER): Payer: Medicare Other

## 2021-04-11 ENCOUNTER — Other Ambulatory Visit: Payer: Self-pay

## 2021-04-11 ENCOUNTER — Encounter (INDEPENDENT_AMBULATORY_CARE_PROVIDER_SITE_OTHER): Payer: Self-pay

## 2021-04-11 VITALS — HR 87 | Temp 97.9°F | Resp 18 | Ht 67.0 in | Wt 269.0 lb

## 2021-04-11 DIAGNOSIS — H60332 Swimmer's ear, left ear: Secondary | ICD-10-CM | POA: Diagnosis not present

## 2021-04-11 DIAGNOSIS — N5201 Erectile dysfunction due to arterial insufficiency: Secondary | ICD-10-CM | POA: Diagnosis not present

## 2021-04-11 DIAGNOSIS — R0982 Postnasal drip: Secondary | ICD-10-CM | POA: Diagnosis not present

## 2021-04-11 DIAGNOSIS — E291 Testicular hypofunction: Secondary | ICD-10-CM | POA: Diagnosis not present

## 2021-04-11 DIAGNOSIS — F524 Premature ejaculation: Secondary | ICD-10-CM | POA: Diagnosis not present

## 2021-04-11 DIAGNOSIS — J343 Hypertrophy of nasal turbinates: Secondary | ICD-10-CM | POA: Diagnosis not present

## 2021-04-11 DIAGNOSIS — R5383 Other fatigue: Secondary | ICD-10-CM | POA: Diagnosis not present

## 2021-04-11 DIAGNOSIS — H9012 Conductive hearing loss, unilateral, left ear, with unrestricted hearing on the contralateral side: Secondary | ICD-10-CM | POA: Diagnosis not present

## 2021-04-11 DIAGNOSIS — J310 Chronic rhinitis: Secondary | ICD-10-CM | POA: Diagnosis not present

## 2021-04-11 NOTE — Progress Notes (Signed)
.  Pt brought new bottle of Testosterone 200 mg/25ml vial in for Testosterone injections. Pt was given  0.13ml to the rt thigh. Pt tolerated well. No complaints. Pt is to rotate legs for every dose injection.

## 2021-04-12 ENCOUNTER — Telehealth: Payer: Self-pay | Admitting: Pulmonary Disease

## 2021-04-12 MED ORDER — AMOXICILLIN-POT CLAVULANATE 875-125 MG PO TABS: 1.0000 | ORAL_TABLET | Freq: Two times a day (BID) | ORAL | 0 refills | Status: DC

## 2021-04-12 NOTE — Telephone Encounter (Signed)
Please call in Augmentin [phone] twice a day for 7 days for your infection and bronchitis Agree that he will benefit from Mucinex for chest congestion

## 2021-04-12 NOTE — Telephone Encounter (Signed)
Called and spoke with pt letting him know the recs stated by Dr. Vaughan Browner and he verbalized understanding. Verified preferred pharmacy and sent Rx for augmentin to pharmacy for pt. Nothing further needed.

## 2021-04-12 NOTE — Telephone Encounter (Signed)
Primary Pulmonologist: dr.[email or web address] Last office visit and with whom: 11/27/20 with Dr. Vaughan Browner What do we see them for (pulmonary problems): COPD mixed type Last OV assessment/plan: see below  Was appointment offered to patient (explain)?     Assessment:  Severe COPD Continues on Daliresp, Bevespi Continue supplemental oxygen Work on weight loss with diet and exercise. Has finished pulmonary rehab twice overall.  Encouraged him to stay active at home.   Discussed PFTs.  He may be a candidate for lung volume reduction and we talked about referral to [facility] interventional radiology for consideration of endobronchial valve.  He is not sure if he wants to proceed and wants to think about it.   Severe OSA Stable on CPAP.  Continue with current setting of 12. Encouraged him to use it every day.   Download reviewed with good compliance and response   Tobacco use Congratulated on quitting smoking. Continue low-dose screening CTs   Pulmonary hypertension Likely group 3 pulmonary hypertension secondary to COPD, OSA.  Continue to monitor.   Health maintenance Up-to-date with Covid booster. Up-to-date with flu and pneumonia vaccination   Plan/Recommendations: - Continue inhalers, Daliresp, supplemental oxygen - Continue CPAP - Screening CT of the chest   Marshell Garfinkel MD [facility] 11/27/2020, 11:32 AM      Reason for call: I called and spoke with patient who reports that he had an ear infection last week and started experiencing drainage in throat. Patient stated he feels worse first thing in AM with stuffy nose and tightness in chest. Once he starts moving around, the mucous comes up and it has become greenish color. He has had to use his O2 more throughout the day. No fevers, chills or N/V. Took a at-home covid test yesterday and it was negative. Patient was asking if Mucinex would be good to take or if Dr. Vaughan Browner has any other suggestions. Routing to Dr.  Vaughan Browner  Dr. Vaughan Browner, please advise. Thanks!  (examples of things to ask: : When did symptoms start? Fever? Cough? Productive? Color to sputum? More sputum than usual? Wheezing? Have you needed increased oxygen? Are you taking your respiratory medications? What over the counter measures have you tried?)  Allergies  Allergen Reactions   Apresoline [Hydralazine] Other (See Comments)    Headache     Immunization History  Administered Date(s) Administered   Influenza Inj Mdck Quad With Preservative 08/09/2020   Influenza, High Dose Seasonal PF 06/29/2018   Influenza,inj,Quad PF,6+ Mos 09/15/2017, 08/25/2019, 08/25/2019   Moderna Sars-Covid-2 Vaccination 01/24/2020, 02/21/2020, 10/03/2020   Pneumococcal Conjugate-13 09/15/2017   Pneumococcal Polysaccharide-23 06/15/2019   Tdap 05/26/2015   Zoster Recombinat (Shingrix) 11/04/2020

## 2021-04-18 ENCOUNTER — Other Ambulatory Visit: Payer: Self-pay

## 2021-04-18 ENCOUNTER — Encounter (INDEPENDENT_AMBULATORY_CARE_PROVIDER_SITE_OTHER): Payer: Self-pay

## 2021-04-18 ENCOUNTER — Ambulatory Visit (INDEPENDENT_AMBULATORY_CARE_PROVIDER_SITE_OTHER): Payer: Medicare Other

## 2021-04-18 VITALS — HR 93 | Resp 17 | Ht 67.0 in | Wt 264.0 lb

## 2021-04-18 DIAGNOSIS — F524 Premature ejaculation: Secondary | ICD-10-CM | POA: Diagnosis not present

## 2021-04-18 DIAGNOSIS — E291 Testicular hypofunction: Secondary | ICD-10-CM

## 2021-04-18 DIAGNOSIS — N5201 Erectile dysfunction due to arterial insufficiency: Secondary | ICD-10-CM

## 2021-04-18 DIAGNOSIS — R5383 Other fatigue: Secondary | ICD-10-CM | POA: Diagnosis not present

## 2021-04-18 NOTE — Progress Notes (Signed)
.  After obtaining consent, and per orders of Dr.GOSRANI, injection of TESTOSTERONE 200 MG/ 0.5ML  given to the left thigh by [place]. Patient instructed to remain in clinic for 5 minutes afterwards, and to report any adverse reaction to me immediately.

## 2021-04-25 ENCOUNTER — Other Ambulatory Visit: Payer: Self-pay

## 2021-04-25 ENCOUNTER — Ambulatory Visit (INDEPENDENT_AMBULATORY_CARE_PROVIDER_SITE_OTHER): Payer: Medicare Other

## 2021-04-25 ENCOUNTER — Encounter (INDEPENDENT_AMBULATORY_CARE_PROVIDER_SITE_OTHER): Payer: Self-pay

## 2021-04-25 VITALS — HR 83 | Resp 17 | Ht 67.0 in | Wt 262.0 lb

## 2021-04-25 DIAGNOSIS — E291 Testicular hypofunction: Secondary | ICD-10-CM | POA: Diagnosis not present

## 2021-04-25 DIAGNOSIS — N5201 Erectile dysfunction due to arterial insufficiency: Secondary | ICD-10-CM

## 2021-04-25 DIAGNOSIS — F524 Premature ejaculation: Secondary | ICD-10-CM

## 2021-04-25 DIAGNOSIS — R5383 Other fatigue: Secondary | ICD-10-CM | POA: Diagnosis not present

## 2021-04-25 NOTE — Progress Notes (Signed)
.  Pt brought new bottle of Testosterone 200 mg/4ml vial in for Testosterone injections . Pt was given  0.48ml to the rt thigh. Pt tolerated well. No complaints. Pt is to rotate legs for every dose injection. This helps to not be sore or irritation at sites. If he notices any reaction to medication at the site of injections to please STOP & call the office to report the reaction to Dr. Anastasio Champion nursing staff. Pt & spouse will be helping to give injections today.  Explain to patient "safety"to always keep away from [place] kids, in dry cool area.

## 2021-04-30 ENCOUNTER — Other Ambulatory Visit: Payer: Self-pay

## 2021-04-30 ENCOUNTER — Encounter (INDEPENDENT_AMBULATORY_CARE_PROVIDER_SITE_OTHER): Payer: Self-pay | Admitting: Internal Medicine

## 2021-04-30 ENCOUNTER — Ambulatory Visit (INDEPENDENT_AMBULATORY_CARE_PROVIDER_SITE_OTHER): Payer: Medicare Other | Admitting: Internal Medicine

## 2021-04-30 VITALS — BP 138/88 | HR 89 | Temp 97.6°F | Resp 18 | Ht 67.0 in | Wt 262.0 lb

## 2021-04-30 DIAGNOSIS — R5383 Other fatigue: Secondary | ICD-10-CM

## 2021-04-30 DIAGNOSIS — E291 Testicular hypofunction: Secondary | ICD-10-CM

## 2021-04-30 DIAGNOSIS — J449 Chronic obstructive pulmonary disease, unspecified: Secondary | ICD-10-CM

## 2021-04-30 DIAGNOSIS — E559 Vitamin D deficiency, unspecified: Secondary | ICD-10-CM

## 2021-04-30 NOTE — Progress Notes (Signed)
Pt stated last few days he vae been feeling  very tired and fatigue. Takes shot on Wed; ask could this be why?

## 2021-04-30 NOTE — Progress Notes (Signed)
Metrics: Intervention Frequency ACO  Documented Smoking Status Yearly  Screened one or more times in 24 months  Cessation Counseling or  Active cessation medication Past 24 months  Past 24 months   Guideline developer: UpToDate (See UpToDate for funding source) Date Released: 2014       Wellness Office Visit  Subjective:  Patient ID: Ricardo Burch, male    DOB: [birth year]  Age: 60 y.o. MRN: [id number]  CC: This man comes in for follow-up of hypogonadism, fatigue, COPD and vitamin D deficiency. HPI  Unfortunately, he has not been taking vitamin D3 supplementation on a regular basis. He feels that testosterone therapy actually makes him feel tired rather than energizing him.  He has had issues with testosterone previously. He continues with oxygen for his COPD. Past Medical History:  Diagnosis Date   Allergy    CHF (congestive heart failure) ([facility])    Chronic kidney disease    COPD (chronic obstructive pulmonary disease) ([place]) Dx 2015   Depression    Eczema    since childhood    Erectile dysfunction 08/18/2019   GERD (gastroesophageal reflux disease) 01/25/2020   Hyperlipidemia 01/12/2018   Hypertension    Oxygen deficiency    Screening for HIV (human immunodeficiency virus)    Sleep apnea    CPAP   Substance abuse ([place])    MJ, cocaine   Testicular failure 07/22/2019   Past Surgical History:  Procedure Laterality Date   COLONOSCOPY WITH PROPOFOL N/A 06/25/2017   Procedure: COLONOSCOPY WITH PROPOFOL;  Surgeon: Daneil Dolin, MD;  [facility];  Service: Endoscopy;  Laterality: N/A;  2:00pm   MULTIPLE EXTRACTIONS WITH ALVEOLOPLASTY N/A 03/22/2016   Procedure: MULTIPLE EXTRACTION WITH ALVEOLOPLASTY;  Surgeon: Diona Browner, DDS;  Location: [facility];  Service: Oral Surgery;  Laterality: N/A;   NO PAST SURGERIES     POLYPECTOMY  06/25/2017   Procedure: POLYPECTOMY;  Surgeon: Daneil Dolin, MD;  [facility];  Service: Endoscopy;;  colon     Family History   Problem Relation Age of Onset   Arthritis Mother    Hypertension Mother    Stroke Mother        age 56   Cerebral aneurysm Father    Alcohol abuse Father    Cancer Brother    Diabetes Neg Hx    Heart disease Neg Hx     Social History   Social History Narrative    Lives alone.    5 daughters 47, 55 yo twins, eldest 2 are married live in [place]. Retired.Girlfriend works in Armed forces logistics/support/administrative officer.   Social History   Tobacco Use   Smoking status: Former    Packs/day: 1.00    Years: 38.00    Pack years: 38.00    Types: Cigarettes    Quit date: 02/21/2020    Years since quitting: 1.1   Smokeless tobacco: Never  Substance Use Topics   Alcohol use: Not Currently    Alcohol/week: 1.0 standard drink    Types: 1 Cans of beer per week    Comment: Socially x 1/month currently (as of 01/25/20); previously: occassionally: weekends, 6-pack or less on a day; or half pint of liquior    Current Meds  Medication Sig   albuterol (VENTOLIN HFA) 108 (90 Base) MCG/ACT inhaler Inhale 1 or 2 puffs by mouth every 6 hours as needed for wheezing or shortness of breath   amLODipine (NORVASC) 5 MG tablet Take 1 tablet (5 mg total) by mouth  daily.   amoxicillin-clavulanate (AUGMENTIN) 875-125 MG tablet Take 1 tablet by mouth 2 (two) times daily.   Budeson-Glycopyrrol-Formoterol (BREZTRI AEROSPHERE) 160-9-4.8 MCG/ACT AERO Inhale 2 puffs into the lungs 2 (two) times daily.   Cholecalciferol (VITAMIN D-3) 125 MCG (5000 UT) TABS Take 2 tablets by mouth daily.   fluticasone (FLONASE) 50 MCG/ACT nasal spray INSTILL 2 SPRAYS IN EACH NOSTRIL TWICE DAILY   ipratropium (ATROVENT) 0.06 % nasal spray Place 1-2 sprays into the nose as directed.   ipratropium-albuterol (DUONEB) 0.5-2.5 (3) MG/3ML SOLN Take 3 mLs by nebulization every 4 (four) hours as needed.   OXYGEN Inhale 3 L into the lungs daily. continuous   roflumilast (DALIRESP) 500 MCG TABS tablet Take 1 tablet (500 mcg total) by mouth daily.   testosterone  cypionate (DEPO-TESTOSTERONE) 200 MG/ML injection Inject 0.5 mLs (100 mg total) into the muscle every 7 (seven) days.   Current Facility-Administered Medications for the 04/30/21 encounter (Office Visit) with Doree Albee, MD  Medication   testosterone cypionate (DEPOTESTOSTERONE CYPIONATE) injection 100 mg     [place] Office Visit from 01/24/2021 in [facility]  PHQ-9 Total Score 0       Objective:   Today's Vitals: BP 138/88 (BP Location: Left Arm, Patient Position: Sitting, Cuff Size: Large)   Pulse 89   Temp 97.6 F (36.4 C) (Temporal)   Resp 18   Ht 5\' 7"  (1.702 m)   Wt 262 lb (118.8 kg)   SpO2 98% Comment: 3L oxygen today.  BMI 41.04 kg/m  Vitals with BMI 04/30/2021 04/25/2021 04/18/2021  Height 5\' 7"  5\' 7"  5\' 7"   Weight 262 lbs 262 lbs 264 lbs  BMI 41.03 01.02 72.53  Systolic 664 - -  Diastolic 88 - -  Pulse 89 83 93     Physical Exam He remains morbidly obese.  Blood pressure somewhat elevated today.      Assessment   1. Testicular hypofunction   2. Chronic obstructive pulmonary disease, unspecified COPD type ([place])   3. Vitamin D deficiency disease   4. Fatigue, unspecified type       Tests ordered No orders of the defined types were placed in this encounter.    Plan: 1.  Since he is having many issues with testosterone therapy, I have told him to discontinue it for a while.  When I see him the next time, we will check DHEA levels to see if this will be a therapeutic option for him. 2.  He needs to take vitamin D3 supplementation on a regular basis. 3.  Continue with oxygen for his COPD and follow-up with pulmonology as previous. 4.  I will see him in September for an annual physical exam    No orders of the defined types were placed in this encounter.   Doree Albee, MD

## 2021-05-02 ENCOUNTER — Ambulatory Visit (INDEPENDENT_AMBULATORY_CARE_PROVIDER_SITE_OTHER): Payer: Medicare Other

## 2021-05-03 ENCOUNTER — Other Ambulatory Visit: Payer: Self-pay

## 2021-05-03 ENCOUNTER — Encounter: Payer: Self-pay | Admitting: Pulmonary Disease

## 2021-05-03 ENCOUNTER — Ambulatory Visit (INDEPENDENT_AMBULATORY_CARE_PROVIDER_SITE_OTHER): Payer: Medicare Other | Admitting: Pulmonary Disease

## 2021-05-03 VITALS — BP 136/74 | HR 95 | Ht 67.0 in | Wt 263.2 lb

## 2021-05-03 DIAGNOSIS — J438 Other emphysema: Secondary | ICD-10-CM | POA: Diagnosis not present

## 2021-05-03 DIAGNOSIS — J449 Chronic obstructive pulmonary disease, unspecified: Secondary | ICD-10-CM

## 2021-05-03 NOTE — Patient Instructions (Signed)
I am glad you are stable with regard to your breathing Continue inhalers Continue CPAP  We will make a referral to interventional radiology at [facility] for consideration of endobronchial valve  Follow-up in 6 months

## 2021-05-03 NOTE — Progress Notes (Signed)
Ricardo Burch    [id number]    04-11-1961  Primary Care Physician:Gosrani, Doristine Johns, MD  Referring Physician: Doree Albee, MD [address] [place],  [place] [address]  Chief complaint:   Follow up for Severe COPD Severe OSA on autoset Active smoker  HPI: Mr. Kozikowski is a 60 year old active smoker, COPD. He's had multiple hospitalizations with COPD, CHF exacerbations in the past.He was hospitalized from 07/26/16-07/30/16 with acute pulmonary edema, acute exacerbation of COPD. He was intubated briefly for hypercarbia.   He has finished the pulmonary rehab and reports improved dyspnea. He has been on a rotating list of inhalers mainly due to his inability to pay co-pay. [name] He has been started on CPAP after a repeat titration study and is tolerating it well. Stop smoking in March 2021  Interim History: Continues on CPAP Continues on breztri, Daliresp.  Has stable chronic dyspnea on exertion On supplemental oxygen  He was placed on testosterone replacement by primary care but stopped after a week due to fatigue  Outpatient Encounter Medications as of 05/03/2021  Medication Sig   albuterol (VENTOLIN HFA) 108 (90 Base) MCG/ACT inhaler Inhale 1 or 2 puffs by mouth every 6 hours as needed for wheezing or shortness of breath   amLODipine (NORVASC) 5 MG tablet Take 1 tablet (5 mg total) by mouth daily.   Budeson-Glycopyrrol-Formoterol (BREZTRI AEROSPHERE) 160-9-4.8 MCG/ACT AERO Inhale 2 puffs into the lungs 2 (two) times daily.   Cholecalciferol (VITAMIN D-3) 125 MCG (5000 UT) TABS Take 2 tablets by mouth daily.   fexofenadine-pseudoephedrine (ALLEGRA-D) 60-120 MG 12 hr tablet Take 1 tablet by mouth 2 (two) times daily.   fluticasone (FLONASE) 50 MCG/ACT nasal spray INSTILL 2 SPRAYS IN EACH NOSTRIL TWICE DAILY   ipratropium (ATROVENT) 0.06 % nasal spray Place 1-2 sprays into the nose as directed.   mometasone (ELOCON) 0.1 % cream Apply topically.   naproxen (NAPROSYN) 500 MG  tablet Take 1 tablet (500 mg total) by mouth 2 (two) times daily with a meal.   OXYGEN Inhale 3 L into the lungs daily. continuous   roflumilast (DALIRESP) 500 MCG TABS tablet Take 1 tablet (500 mcg total) by mouth daily.   ipratropium-albuterol (DUONEB) 0.5-2.5 (3) MG/3ML SOLN Take 3 mLs by nebulization every 4 (four) hours as needed. (Patient not taking: Reported on 05/03/2021)   [DISCONTINUED] amoxicillin-clavulanate (AUGMENTIN) 875-125 MG tablet Take 1 tablet by mouth 2 (two) times daily.   [DISCONTINUED] ID NOW COVID-19 KIT TEST AS DIRECTED TODAY   [DISCONTINUED] testosterone cypionate (DEPO-TESTOSTERONE) 200 MG/ML injection Inject 0.5 mLs (100 mg total) into the muscle every 7 (seven) days.   Facility-Administered Encounter Medications as of 05/03/2021  Medication   testosterone cypionate (DEPOTESTOSTERONE CYPIONATE) injection 100 mg   Physical Exam: Blood pressure 136/74, pulse 95, height 5' 7"  (1.702 m), weight 263 lb 3.2 oz (119.4 kg), SpO2 91 %. Gen:      No acute distress HEENT:  EOMI, sclera anicteric Neck:     No masses; no thyromegaly Lungs:    Clear to auscultation bilaterally; normal respiratory effort CV:         Regular rate and rhythm; no murmurs Abd:      + bowel sounds; soft, non-tender; no palpable masses, no distension Ext:    No edema; adequate peripheral perfusion Skin:      Warm and dry; no rash Neuro: alert and oriented x 3 Psych: normal mood and affect   Data Reviewed: Imaging  Screening CT chest 07/14/2019-emphysema, subcentimeter pulmonary nodule.  Atelectasis along the right major fissure.  Screening CT chest 08/21/2020-emphysema, upper lobe predominant.  Stable pulmonary nodules I have reviewed the images personally.  PFTs  01/16/16 FVC 2.43 [58%], FEV1 1.49 [45%), F/F 61, TLC 81%,DLCO 56% Severe obstruction, moderate reduction in diffusion capacity.  11/27/2020 FVC 2.04 [56%], FEV1 1.08 [37%], F/F 53, TLC 6.36 [99%], DLCO 12.91, 50%] Severe obstruction  with air trapping and bronchodilator response.  Severe diffusion defect.  Sleep PSG 10/22/15  evere OSA, AHI 151. Started on an AutoSet CPAP titration study  Initiate CPAP at 12 with 2 L oxygen, fullface mask.   CPAP compliance 09/27/2020 100% compliant with good response   Labs CBC 08/24/17-absolute eosinophil count 254 CBC 06/13/2019-WBC 12.7, eos 1%, absolute eosinophil count 127 Alpha-1 antitrypsin 06/15/2019-134, PI MM  Cardiac Echo (09/18/15) The right ventricular systolic pressure was increased consistent  with moderate pulmonary hypertension.moderate concentric LVH. LVEF 60-65 percent. Grade 2 diastolic dysfunction. RV cavity size severe grade dilated. RV systolic function moderately to severely reduced. PA pressure 56  Assessment:  Severe COPD Continues on Daliresp, Bevespi Continue supplemental oxygen Work on weight loss with diet and exercise. Has finished pulmonary rehab twice overall.  Encouraged him to stay active at home.  He wants to explore advanced therapies at [facility].  We discussed lung volume reduction with endobronchial valve will be referred to [facility] for that.  If turned down then consider lung transplant evaluation.  Severe OSA Stable on CPAP.  Continue with current setting of 12. Encouraged him to use it every day.  Download reviewed with good compliance and response   Tobacco use Continue low-dose screening CTs    Pulmonary hypertension Likely group 3 pulmonary hypertension secondary to COPD, OSA.  Continue to monitor.  Health maintenance Up-to-date with Covid booster. Up-to-date with flu and pneumonia vaccination  Plan/Recommendations: - Continue inhalers, Daliresp, supplemental oxygen - Continue CPAP - Screening CT of the chest - Referral to [facility] for endobronchial valve  Marshell Garfinkel MD [facility] 05/03/2021, 12:14 PM  CC: Doree Albee, MD

## 2021-05-05 DIAGNOSIS — J449 Chronic obstructive pulmonary disease, unspecified: Secondary | ICD-10-CM | POA: Diagnosis not present

## 2021-05-09 ENCOUNTER — Ambulatory Visit (INDEPENDENT_AMBULATORY_CARE_PROVIDER_SITE_OTHER): Payer: Medicare Other

## 2021-05-15 DIAGNOSIS — J343 Hypertrophy of nasal turbinates: Secondary | ICD-10-CM | POA: Diagnosis not present

## 2021-05-15 DIAGNOSIS — H608X2 Other otitis externa, left ear: Secondary | ICD-10-CM | POA: Diagnosis not present

## 2021-05-15 DIAGNOSIS — J310 Chronic rhinitis: Secondary | ICD-10-CM | POA: Diagnosis not present

## 2021-05-16 ENCOUNTER — Ambulatory Visit (INDEPENDENT_AMBULATORY_CARE_PROVIDER_SITE_OTHER): Payer: Medicare Other

## 2021-05-23 ENCOUNTER — Ambulatory Visit (INDEPENDENT_AMBULATORY_CARE_PROVIDER_SITE_OTHER): Payer: Medicare Other

## 2021-05-30 ENCOUNTER — Ambulatory Visit (INDEPENDENT_AMBULATORY_CARE_PROVIDER_SITE_OTHER): Payer: Medicare Other

## 2021-06-05 ENCOUNTER — Other Ambulatory Visit: Payer: Self-pay | Admitting: Pulmonary Disease

## 2021-06-05 ENCOUNTER — Telehealth: Payer: Self-pay | Admitting: Pulmonary Disease

## 2021-06-05 DIAGNOSIS — J449 Chronic obstructive pulmonary disease, unspecified: Secondary | ICD-10-CM | POA: Diagnosis not present

## 2021-06-05 DIAGNOSIS — J438 Other emphysema: Secondary | ICD-10-CM

## 2021-06-05 NOTE — Telephone Encounter (Signed)
Called spoke with patient.  Patient states there was a mix up with the mail delivery.  Patient states he is breathing well without it. He hasn't had it yesterday not today.  He states he is having hoarsness, lots of mucus, and throat irritation while on Breztri. States he feels better being off the [place].  But he wants to know should be be off the [place] or change to something else or get back on the [place].   Patient states he is outside walking and he has been doing well. His oxygen is 92% while off Breztri.  Sending to Dr. Vaughan Browner for further recommendations.

## 2021-06-06 ENCOUNTER — Ambulatory Visit (INDEPENDENT_AMBULATORY_CARE_PROVIDER_SITE_OTHER): Payer: Medicare Other

## 2021-06-06 NOTE — Telephone Encounter (Signed)
We can try trelegy 200 if he has not used it before. Otherwise go back on breztri

## 2021-06-06 NOTE — Telephone Encounter (Signed)
Called and spoke with Patient.  Dr. Matilde Bash recommendations given.  Patient stated he had a cold earlier and thought they may have been his issues, and not the Breztri.  Offered Trelegy 200 prescription and Patient declined at this time.  Patient stated he has a upcoming OV with [facility] and he did not want to change inhalers, until he speaks with [facility]. Breztri prescription sent to requested pharmacy.  Flonase prescription sent to pharmacy this morning. Nothing further at this time.

## 2021-06-07 ENCOUNTER — Telehealth: Payer: Self-pay | Admitting: Pulmonary Disease

## 2021-06-07 ENCOUNTER — Encounter: Payer: Self-pay | Admitting: *Deleted

## 2021-06-07 ENCOUNTER — Other Ambulatory Visit (INDEPENDENT_AMBULATORY_CARE_PROVIDER_SITE_OTHER): Payer: Self-pay | Admitting: Nurse Practitioner

## 2021-06-07 ENCOUNTER — Other Ambulatory Visit: Payer: Self-pay | Admitting: Pulmonary Disease

## 2021-06-07 DIAGNOSIS — I10 Essential (primary) hypertension: Secondary | ICD-10-CM

## 2021-06-07 DIAGNOSIS — J438 Other emphysema: Secondary | ICD-10-CM

## 2021-06-07 MED ORDER — BREZTRI AEROSPHERE 160-9-4.8 MCG/ACT IN AERO: 2.0000 | INHALATION_SPRAY | Freq: Two times a day (BID) | RESPIRATORY_TRACT | 5 refills | Status: DC

## 2021-06-07 MED ORDER — AMLODIPINE BESYLATE 5 MG PO TABS: 5.0000 mg | ORAL_TABLET | Freq: Every day | ORAL | 0 refills | Status: DC

## 2021-06-07 MED ORDER — FLUTICASONE PROPIONATE 50 MCG/ACT NA SUSP: NASAL | 5 refills | Status: DC

## 2021-06-07 NOTE — Telephone Encounter (Signed)
I called and spoke with Verdis Frederickson at [facility] regarding refills on [place] and Flonase. It looks like Lattie Haw sent them in yesterday and have receipt confirmed by pharmacy. Verdis Frederickson checked and could not find the scripts so I went ahead and re faxed them. Verdis Frederickson stated they will call if they do not receive them tomorrow. Nothing further needed at this time.

## 2021-06-07 NOTE — Telephone Encounter (Signed)
This is not an emergency - Dr Vaughan Browner can sign this letter. Go ahead and write it for him

## 2021-06-07 NOTE — Telephone Encounter (Signed)
Call returned to patient, confirmed DOB. Patient states he is working with a program through social services and they are trying to get him some help to pay for his light bill. He is requesting a letter stating he uses oxygen and cpap. I made him [employer] has a form that they use. He will need to contact them and get the form and then we can fill it out. Patient insists that he needs the letter first.   RB please advise if willing to sign letter in absence of PM.   If okay is this okay?  To whom it may concern:  ........... is a patient in this office being seen for COPD, Emphysema, and obstructive sleep apnea. This patient requires continuous oxygen use at 3L and cpap use at night.

## 2021-06-07 NOTE — Telephone Encounter (Signed)
Letter composed and pended-will forward to Dr Vaughan Browner so he can review this msg and sign letter when he returns to clinic per Dr Agustina Caroli request. Thanks!

## 2021-06-07 NOTE — Telephone Encounter (Signed)
I called spoke with Verdis Frederickson at [facility] about refills. It looks like they were sent to the pharmacy yesterday and have receipt confirmed by pharmacy. Verdis Frederickson checked and they had not received them so I went ahead and resent them in. Verdis Frederickson stated they will call us back tomorrow if they do not receive them. Nothing further needed at this time

## 2021-06-12 ENCOUNTER — Telehealth: Payer: Self-pay | Admitting: Pulmonary Disease

## 2021-06-12 NOTE — Telephone Encounter (Signed)
LMTCB  Per RB this is not an emergency and can be handled by PM upon his return. PM returns in 8/17.

## 2021-06-13 ENCOUNTER — Ambulatory Visit (INDEPENDENT_AMBULATORY_CARE_PROVIDER_SITE_OTHER): Payer: Medicare Other

## 2021-06-14 NOTE — Telephone Encounter (Signed)
Patient is returning phone call. Patient phone number is [phone].

## 2021-06-14 NOTE — Telephone Encounter (Signed)
LMTCB  Dr Vaughan Browner returns 8/17. PM will be made aware at the time regarding letter.

## 2021-06-14 NOTE — Telephone Encounter (Signed)
Patient is returning phone call. Patient phone number is [phone].

## 2021-06-15 NOTE — Telephone Encounter (Signed)
Spoke with pt and explained letter is written and we are just waiting to Dr. Vaughan Browner to return to clinic to sign. Informed pt we will notify pt when letter is ready for mail or to be picked up. Pt stated understanding. Nothing further needed at this time.

## 2021-06-15 NOTE — Telephone Encounter (Signed)
Pt stated that he is aware that Dr. Vaughan Browner does not return to office until 06/20/2021 and he said that he is needing it before that date because he said that he has to give this letter to [facility] and his lights will be turned off without the letter and he then wont have access to use his concentrator or his CPAP machine. Pls regard; [phone].

## 2021-06-20 ENCOUNTER — Other Ambulatory Visit: Payer: Self-pay

## 2021-06-20 ENCOUNTER — Ambulatory Visit (INDEPENDENT_AMBULATORY_CARE_PROVIDER_SITE_OTHER): Payer: Medicare Other

## 2021-06-20 ENCOUNTER — Ambulatory Visit
Admission: EM | Admit: 2021-06-20 | Discharge: 2021-06-20 | Disposition: A | Discharge status: Home / Self Care | Payer: Medicare Other | Attending: Family Medicine | Admitting: Family Medicine

## 2021-06-20 DIAGNOSIS — M545 Low back pain, unspecified: Secondary | ICD-10-CM | POA: Diagnosis not present

## 2021-06-20 LAB — POCT URINALYSIS DIP (MANUAL ENTRY)
Bilirubin, UA: NEGATIVE
Glucose, UA: NEGATIVE mg/dL
Ketones, POC UA: NEGATIVE mg/dL
Leukocytes, UA: NEGATIVE
Nitrite, UA: NEGATIVE
Protein Ur, POC: NEGATIVE mg/dL
Spec Grav, UA: 1.025 (ref 1.010–1.025)
Urobilinogen, UA: 1 E.U./dL
pH, UA: 6.5 (ref 5.0–8.0)

## 2021-06-20 MED ORDER — MELOXICAM 15 MG PO TABS: 15.0000 mg | ORAL_TABLET | Freq: Every day | ORAL | 0 refills | Status: DC

## 2021-06-20 NOTE — ED Provider Notes (Signed)
[place]   [id number] 06/20/21 Arrival Time: [id number]  ASSESSMENT & PLAN:  1. Acute left-sided low back pain without sciatica    U/A with trace Hg. No signs of infection. Suspect back pain is MSK related. Discussed.  Able to ambulate here and hemodynamically stable. No indication for imaging of back at this time given no trauma and normal neurological exam. Discussed.  Begin: Meds ordered this encounter  Medications   meloxicam (MOBIC) 15 MG tablet    Sig: Take 1 tablet (15 mg total) by mouth daily.    Dispense:  14 tablet    Refill:  0   Encourage ROM/movement as tolerated.  Recommend:  Follow-up Information     [facility].   Why: If worsening or failing to improve as anticipated. Contact information: [address] [place] [id number]                Reviewed expectations re: course of current medical issues. Questions answered. Outlined signs and symptoms indicating need for more acute intervention. Patient verbalized understanding. After Visit Summary given.   SUBJECTIVE: History from: patient.  Ricardo Burch is a 60 y.o. male who presents with complaint of intermittent left sided lower back discomfort. Onset gradual. First noted over past week. Injury/trama: none reported; but does related working on cars; on back under cars; questions relation. History of back problems requiring medical care: occasional. Pain described as aching and dull and without radiation. Aggravating factors: certain movements. Alleviating factors: have not been identified. Progressive LE weakness or saddle anesthesia: none. Extremity sensation changes or weakness: none. Ambulatory without difficulty. Normal bowel/bladder habits: yes; without urinary retention. Normal PO intake without n/v. No associated abdominal pain/n/v. No tx PTA.  Reports no chronic steroid use, fevers, IV drug use, or recent back surgeries or  procedures.   OBJECTIVE:  Vitals:   06/20/21 1724  BP: (!) 145/82  Pulse: 90  Resp: 20  Temp: 98.3 F (36.8 C)  SpO2: 92%    General appearance: alert; no distress HEENT: [place]; AT Lungs: unlabored respirations; speaks full sentences without difficulty Abdomen: soft, non-tender; non-distended Back: mild  and poorly localized tenderness to palpation over L lumbar paraspinal musculature ; FROM at waist; bruising: none; without midline tenderness Skin: warm and dry Neurologic: normal gait; normal sensation and strength of bilateral LE Psychological: alert and cooperative; normal mood and affect  Labs: Results for orders placed or performed during the hospital encounter of 06/20/21  POCT urinalysis dipstick  Result Value Ref Range   Color, UA yellow yellow   Clarity, UA clear clear   Glucose, UA negative negative mg/dL   Bilirubin, UA negative negative   Ketones, POC UA negative negative mg/dL   Spec Grav, UA 1.025 1.010 - 1.025   Blood, UA trace-intact (A) negative   pH, UA 6.5 5.0 - 8.0   Protein Ur, POC negative negative mg/dL   Urobilinogen, UA 1.0 0.2 or 1.0 E.U./dL   Nitrite, UA Negative Negative   Leukocytes, UA Negative Negative   Labs Reviewed  POCT URINALYSIS DIP (MANUAL ENTRY) - Abnormal; Notable for the following components:      Result Value   Blood, UA trace-intact (*)    All other components within normal limits    Allergies  Allergen Reactions   Apresoline [Hydralazine] Other (See Comments)    Headache     Past Medical History:  Diagnosis Date   Allergy    CHF (congestive heart  failure) ([facility])    Chronic kidney disease    COPD (chronic obstructive pulmonary disease) ([facility]) Dx 2015   Depression    Eczema    since childhood    Erectile dysfunction 08/18/2019   GERD (gastroesophageal reflux disease) 01/25/2020   Hyperlipidemia 01/12/2018   Hypertension    Oxygen deficiency    Screening for HIV (human immunodeficiency virus)    Sleep apnea    CPAP    Substance abuse ([facility])    MJ, cocaine   Testicular failure 07/22/2019   Social History   Socioeconomic History   Marital status: Single    Spouse name: Not on file   Number of children: 5   Years of education: 12   Highest education level: Not on file  Occupational History   Occupation: Unemployed     Comment: car wash details  Tobacco Use   Smoking status: Former    Packs/day: 1.00    Years: 38.00    Pack years: 38.00    Types: Cigarettes    Quit date: 02/21/2020    Years since quitting: 1.3   Smokeless tobacco: Never  Vaping Use   Vaping Use: Never used  Substance and Sexual Activity   Alcohol use: Not Currently    Alcohol/week: 1.0 standard drink    Types: 1 Cans of beer per week    Comment: Socially x 1/month currently (as of 01/25/20); previously: occassionally: weekends, 6-pack or less on a day; or half pint of liquior   Drug use: Yes    Types: Marijuana, Cocaine    Comment: 01/25/20-marijuana few weeks ago, no cocaine   Sexual activity: Never  Other Topics Concern   Not on file  Social History Narrative    Lives alone.    5 daughters 38, 33 yo twins, eldest 2 are married live in [place]. Retired.Girlfriend works in Armed forces logistics/support/administrative officer.   Social Determinants of Health   Financial Resource Strain: Not on file  Food Insecurity: Not on file  Transportation Needs: Not on file  Physical Activity: Not on file  Stress: Not on file  Social Connections: Not on file  Intimate Partner Violence: Not on file   Family History  Problem Relation Age of Onset   Arthritis Mother    Hypertension Mother    Stroke Mother        age 83   Cerebral aneurysm Father    Alcohol abuse Father    Cancer Brother    Diabetes Neg Hx    Heart disease Neg Hx    Past Surgical History:  Procedure Laterality Date   COLONOSCOPY WITH PROPOFOL N/A 06/25/2017   Procedure: COLONOSCOPY WITH PROPOFOL;  Surgeon: Daneil Dolin, MD;  [facility];  Service: Endoscopy;  Laterality: N/A;   2:00pm   MULTIPLE EXTRACTIONS WITH ALVEOLOPLASTY N/A 03/22/2016   Procedure: MULTIPLE EXTRACTION WITH ALVEOLOPLASTY;  Surgeon: Diona Browner, DDS;  Location: [facility];  Service: Oral Surgery;  Laterality: N/A;   NO PAST SURGERIES     POLYPECTOMY  06/25/2017   Procedure: POLYPECTOMY;  Surgeon: Daneil Dolin, MD;  [facility];  Service: Endoscopy;;  colon      Vanessa Kick, MD 06/20/21 1943

## 2021-06-20 NOTE — ED Triage Notes (Signed)
Pt presents with c/o back pain and odorous urine for past week

## 2021-06-25 ENCOUNTER — Telehealth: Payer: Self-pay | Admitting: Pulmonary Disease

## 2021-06-26 NOTE — Telephone Encounter (Signed)
Spoke with the pt  He wants letter mailed to him  He thinks the stamped signature is acceptable and I have printed and stamped letter and mailed to his home address which I verified  Nothing further needed

## 2021-06-27 ENCOUNTER — Ambulatory Visit (INDEPENDENT_AMBULATORY_CARE_PROVIDER_SITE_OTHER): Payer: Medicare Other

## 2021-06-28 ENCOUNTER — Encounter: Payer: Self-pay | Admitting: Internal Medicine

## 2021-07-02 ENCOUNTER — Other Ambulatory Visit: Payer: Self-pay | Admitting: Pulmonary Disease

## 2021-07-02 DIAGNOSIS — J438 Other emphysema: Secondary | ICD-10-CM

## 2021-07-04 ENCOUNTER — Ambulatory Visit (INDEPENDENT_AMBULATORY_CARE_PROVIDER_SITE_OTHER): Payer: Medicare Other

## 2021-07-11 ENCOUNTER — Ambulatory Visit (INDEPENDENT_AMBULATORY_CARE_PROVIDER_SITE_OTHER): Payer: Medicare Other

## 2021-07-17 ENCOUNTER — Ambulatory Visit (INDEPENDENT_AMBULATORY_CARE_PROVIDER_SITE_OTHER): Payer: Medicare Other | Admitting: Internal Medicine

## 2021-07-17 ENCOUNTER — Encounter: Payer: Self-pay | Admitting: Internal Medicine

## 2021-07-17 ENCOUNTER — Other Ambulatory Visit: Payer: Self-pay

## 2021-07-17 VITALS — BP 128/81 | HR 77 | Temp 97.7°F | Ht 67.0 in | Wt 258.0 lb

## 2021-07-17 DIAGNOSIS — R140 Abdominal distension (gaseous): Secondary | ICD-10-CM | POA: Diagnosis not present

## 2021-07-17 DIAGNOSIS — Z86010 Personal history of colonic polyps: Secondary | ICD-10-CM | POA: Diagnosis not present

## 2021-07-17 DIAGNOSIS — K219 Gastro-esophageal reflux disease without esophagitis: Secondary | ICD-10-CM | POA: Diagnosis not present

## 2021-07-17 NOTE — Progress Notes (Signed)
Primary Care Physician:  Doree Albee, MD (Inactive) Primary Gastroenterologist:  Dr. Gala Romney  Pre-Procedure History & Physical: HPI:  Ricardo Burch is a 60 y.o. male here for follow-up of GERD.  Stop pantoprazole late last year peers had recurrent reflux symptoms least 3 episodes weekly.  No dysphagia.  Significantly over his ideal body weight.  Has poor eating habits (late-night eating, spicy foods, etc.). Weight is stable at 258 pounds.  History of colonic adenoma; due for surveillance colonoscopy 2023.  Past Medical History:  Diagnosis Date   Allergy    CHF (congestive heart failure) ([facility])    Chronic kidney disease    COPD (chronic obstructive pulmonary disease) ([place]) Dx 2015   Depression    Eczema    since childhood    Erectile dysfunction 08/18/2019   GERD (gastroesophageal reflux disease) 01/25/2020   Hyperlipidemia 01/12/2018   Hypertension    Oxygen deficiency    Screening for HIV (human immunodeficiency virus)    Sleep apnea    CPAP   Substance abuse ([place])    MJ, cocaine   Testicular failure 07/22/2019    Past Surgical History:  Procedure Laterality Date   COLONOSCOPY WITH PROPOFOL N/A 06/25/2017   Procedure: COLONOSCOPY WITH PROPOFOL;  Surgeon: Daneil Dolin, MD;  [facility];  Service: Endoscopy;  Laterality: N/A;  2:00pm   MULTIPLE EXTRACTIONS WITH ALVEOLOPLASTY N/A 03/22/2016   Procedure: MULTIPLE EXTRACTION WITH ALVEOLOPLASTY;  Surgeon: Diona Browner, DDS;  Location: [facility];  Service: Oral Surgery;  Laterality: N/A;   NO PAST SURGERIES     POLYPECTOMY  06/25/2017   Procedure: POLYPECTOMY;  Surgeon: Daneil Dolin, MD;  [facility];  Service: Endoscopy;;  colon    Prior to Admission medications   Medication Sig Start Date End Date Taking? Authorizing Provider  albuterol (VENTOLIN HFA) 108 (90 Base) MCG/ACT inhaler INHALE 1 OR 2 PUFFS BY MOUTH EVERY 6 HOURS AS NEEDED FOR WHEEZING OR SHORTNESS OF BREATH 07/02/21  Yes Mannam, Praveen, MD   amLODipine (NORVASC) 5 MG tablet Take 1 tablet (5 mg total) by mouth daily. 06/07/21  Yes Ailene Ards, NP  Budeson-Glycopyrrol-Formoterol (BREZTRI AEROSPHERE) 160-9-4.8 MCG/ACT AERO INHALE 2 PUFFS BY MOUTH TWICE DAILY 07/02/21  Yes Mannam, Praveen, MD  Cholecalciferol (VITAMIN D-3) 125 MCG (5000 UT) TABS Take 2 tablets by mouth daily.   Yes [provider]  fexofenadine-pseudoephedrine (ALLEGRA-D) 60-120 MG 12 hr tablet Take 1 tablet by mouth 2 (two) times daily.   Yes Gosrani, Nimish C, MD  fluticasone (FLONASE) 50 MCG/ACT nasal spray INSTILL 2 SPRAYS IN EACH NOSTRIL TWICE DAILY 06/07/21  Yes Mannam, Praveen, MD  ipratropium (ATROVENT) 0.06 % nasal spray Place 1-2 sprays into the nose as directed. 04/11/21  Yes [provider]  OXYGEN Inhale 3 L into the lungs daily. continuous   Yes [provider]  roflumilast (DALIRESP) 500 MCG TABS tablet Take 1 tablet (500 mcg total) by mouth daily. 02/27/21  Yes Marshell Garfinkel, MD    Allergies as of 07/17/2021 - Review Complete 07/17/2021  Allergen Reaction Noted   Apresoline [hydralazine] Other (See Comments) 07/10/2016    Family History  Problem Relation Age of Onset   Arthritis Mother    Hypertension Mother    Stroke Mother        age 64   Cerebral aneurysm Father    Alcohol abuse Father    Cancer Brother    Diabetes Neg Hx    Heart disease Neg  Hx     Social History   Socioeconomic History   Marital status: Single    Spouse name: Not on file   Number of children: 5   Years of education: 12   Highest education level: Not on file  Occupational History   Occupation: Unemployed     Comment: car wash details  Tobacco Use   Smoking status: Former    Packs/day: 1.00    Years: 38.00    Pack years: 38.00    Types: Cigarettes    Quit date: 02/21/2020    Years since quitting: 1.4   Smokeless tobacco: Never  Vaping Use   Vaping Use: Never used  Substance and Sexual Activity   Alcohol use: Not Currently     Alcohol/week: 1.0 standard drink    Types: 1 Cans of beer per week    Comment: Socially x 1/month currently (as of 01/25/20); previously: occassionally: weekends, 6-pack or less on a day; or half pint of liquior   Drug use: Yes    Types: Marijuana, Cocaine    Comment: 01/25/20-marijuana few weeks ago, no cocaine   Sexual activity: Never  Other Topics Concern   Not on file  Social History Narrative    Lives alone.    5 daughters 63, 44 yo twins, eldest 2 are married live in [place]. Retired.Girlfriend works in Armed forces logistics/support/administrative officer.   Social Determinants of Health   Financial Resource Strain: Not on file  Food Insecurity: Not on file  Transportation Needs: Not on file  Physical Activity: Not on file  Stress: Not on file  Social Connections: Not on file  Intimate Partner Violence: Not on file    Review of Systems: See HPI, otherwise negative ROS  Physical Exam: BP 128/81   Pulse 77   Temp 97.7 F (36.5 C)   Ht '5\' 7"'$  (1.702 m)   Wt 258 lb (117 kg)   BMI 40.41 kg/m  General:   Alert,  pleasant and cooperative in NAD Neck:  Supple; no masses or thyromegaly. No significant cervical adenopathy. Lungs:  Clear throughout to auscultation.   No wheezes, crackles, or rhonchi. No acute distress. Heart:  Regular rate and rhythm; no murmurs, clicks, rubs,  or gallops. Abdomen: Obese.  Positive bowel sounds soft and nontender without appreciable mass organomegaly    Impression/Plan: 60 year old obese gentleman with a well-established GERD.  He has frequent reflux symptoms without alarm features now that he has come off PPI.  He remains significantly overweight and continues to have poor eating habits.  We discussed the good safety profile of acid suppression therapy and the multipronged approach to gastroesophageal reflux disease including lifestyle modification (exercise dietary modification, etc.).  History of colonic adenoma; due for [facility] colonoscopy 2023.   Recommendations:  Lets  resume Protonix or pantoprazole 40 mg daily before breakfast (dispense 90 with 3 refills)  GERD information lifestyle tips diet, etc. Provided  We will plan to him back in 9 months.  Plan for surveillance colonoscopy next year.  Patient to call with any interim problems.   Notice: This dictation was prepared with Dragon dictation along with smaller phrase technology. Any transcriptional errors that result from this process are unintentional and may not be corrected upon review.

## 2021-07-17 NOTE — Patient Instructions (Addendum)
It was good to see you today!  Lets resume Protonix or pantoprazole 40 mg daily before your first meal of the day (dispense 90 with 3 refills)  GERD information lifestyle tips diet, etc. Provided  We will plan to see you back in 9 months.  Plan for surveillance colonoscopy next year.  Please call if you have any interim problems.

## 2021-07-18 ENCOUNTER — Ambulatory Visit (INDEPENDENT_AMBULATORY_CARE_PROVIDER_SITE_OTHER): Payer: Medicare Other

## 2021-07-19 ENCOUNTER — Other Ambulatory Visit: Payer: Self-pay | Admitting: Internal Medicine

## 2021-07-19 MED ORDER — PANTOPRAZOLE SODIUM 40 MG PO TBEC: 40.0000 mg | DELAYED_RELEASE_TABLET | Freq: Every day | ORAL | 3 refills | Status: DC

## 2021-07-25 ENCOUNTER — Ambulatory Visit (INDEPENDENT_AMBULATORY_CARE_PROVIDER_SITE_OTHER): Payer: Medicare Other

## 2021-08-01 ENCOUNTER — Encounter (INDEPENDENT_AMBULATORY_CARE_PROVIDER_SITE_OTHER): Payer: Medicare Other | Admitting: Internal Medicine

## 2021-08-01 ENCOUNTER — Ambulatory Visit (INDEPENDENT_AMBULATORY_CARE_PROVIDER_SITE_OTHER): Payer: Medicare Other

## 2021-08-08 ENCOUNTER — Ambulatory Visit (INDEPENDENT_AMBULATORY_CARE_PROVIDER_SITE_OTHER): Payer: Medicare Other

## 2021-08-15 ENCOUNTER — Ambulatory Visit (INDEPENDENT_AMBULATORY_CARE_PROVIDER_SITE_OTHER): Payer: Medicare Other

## 2021-08-22 ENCOUNTER — Ambulatory Visit (INDEPENDENT_AMBULATORY_CARE_PROVIDER_SITE_OTHER): Payer: Medicare Other

## 2021-08-28 DIAGNOSIS — I272 Pulmonary hypertension, unspecified: Secondary | ICD-10-CM | POA: Insufficient documentation

## 2021-08-29 ENCOUNTER — Ambulatory Visit (INDEPENDENT_AMBULATORY_CARE_PROVIDER_SITE_OTHER): Payer: Medicare Other

## 2021-08-30 DIAGNOSIS — R911 Solitary pulmonary nodule: Secondary | ICD-10-CM | POA: Insufficient documentation

## 2021-09-05 ENCOUNTER — Ambulatory Visit (INDEPENDENT_AMBULATORY_CARE_PROVIDER_SITE_OTHER): Payer: Medicare Other

## 2021-09-05 DIAGNOSIS — J449 Chronic obstructive pulmonary disease, unspecified: Secondary | ICD-10-CM | POA: Diagnosis not present

## 2021-09-12 ENCOUNTER — Ambulatory Visit (INDEPENDENT_AMBULATORY_CARE_PROVIDER_SITE_OTHER): Payer: Medicare Other

## 2021-09-17 DIAGNOSIS — J449 Chronic obstructive pulmonary disease, unspecified: Secondary | ICD-10-CM | POA: Diagnosis not present

## 2021-09-17 DIAGNOSIS — G4733 Obstructive sleep apnea (adult) (pediatric): Secondary | ICD-10-CM | POA: Diagnosis not present

## 2021-09-19 ENCOUNTER — Ambulatory Visit (INDEPENDENT_AMBULATORY_CARE_PROVIDER_SITE_OTHER): Payer: Medicare Other

## 2021-09-25 ENCOUNTER — Other Ambulatory Visit: Payer: Self-pay | Admitting: Pulmonary Disease

## 2021-09-25 ENCOUNTER — Other Ambulatory Visit (INDEPENDENT_AMBULATORY_CARE_PROVIDER_SITE_OTHER): Payer: Self-pay | Admitting: Nurse Practitioner

## 2021-09-25 DIAGNOSIS — I10 Essential (primary) hypertension: Secondary | ICD-10-CM

## 2021-09-25 DIAGNOSIS — J438 Other emphysema: Secondary | ICD-10-CM

## 2021-09-26 ENCOUNTER — Ambulatory Visit (INDEPENDENT_AMBULATORY_CARE_PROVIDER_SITE_OTHER): Payer: Medicare Other

## 2021-09-28 ENCOUNTER — Encounter (HOSPITAL_COMMUNITY): Payer: Self-pay | Admitting: *Deleted

## 2021-09-28 ENCOUNTER — Emergency Department (HOSPITAL_COMMUNITY)
Admission: EM | Admit: 2021-09-28 | Discharge: 2021-09-28 | Disposition: A | Discharge status: Home / Self Care | Payer: Medicare Other | Attending: Emergency Medicine | Admitting: Emergency Medicine

## 2021-09-28 ENCOUNTER — Emergency Department (HOSPITAL_COMMUNITY): Payer: Medicare Other

## 2021-09-28 ENCOUNTER — Other Ambulatory Visit: Payer: Self-pay

## 2021-09-28 DIAGNOSIS — N189 Chronic kidney disease, unspecified: Secondary | ICD-10-CM | POA: Diagnosis not present

## 2021-09-28 DIAGNOSIS — I509 Heart failure, unspecified: Secondary | ICD-10-CM | POA: Insufficient documentation

## 2021-09-28 DIAGNOSIS — N3289 Other specified disorders of bladder: Secondary | ICD-10-CM | POA: Diagnosis not present

## 2021-09-28 DIAGNOSIS — I130 Hypertensive heart and chronic kidney disease with heart failure and stage 1 through stage 4 chronic kidney disease, or unspecified chronic kidney disease: Secondary | ICD-10-CM | POA: Insufficient documentation

## 2021-09-28 DIAGNOSIS — R8290 Unspecified abnormal findings in urine: Secondary | ICD-10-CM

## 2021-09-28 DIAGNOSIS — Z87891 Personal history of nicotine dependence: Secondary | ICD-10-CM | POA: Diagnosis not present

## 2021-09-28 DIAGNOSIS — M545 Low back pain, unspecified: Secondary | ICD-10-CM | POA: Insufficient documentation

## 2021-09-28 DIAGNOSIS — R82998 Other abnormal findings in urine: Secondary | ICD-10-CM | POA: Diagnosis not present

## 2021-09-28 DIAGNOSIS — J449 Chronic obstructive pulmonary disease, unspecified: Secondary | ICD-10-CM | POA: Diagnosis not present

## 2021-09-28 DIAGNOSIS — I700 Atherosclerosis of aorta: Secondary | ICD-10-CM | POA: Diagnosis not present

## 2021-09-28 DIAGNOSIS — K5730 Diverticulosis of large intestine without perforation or abscess without bleeding: Secondary | ICD-10-CM | POA: Insufficient documentation

## 2021-09-28 LAB — COMPREHENSIVE METABOLIC PANEL
ALT: 15 U/L (ref 0–44)
AST: 16 U/L (ref 15–41)
Albumin: 3.6 g/dL (ref 3.5–5.0)
Alkaline Phosphatase: 78 U/L (ref 38–126)
Anion gap: 6 (ref 5–15)
BUN: 18 mg/dL (ref 6–20)
CO2: 29 mmol/L (ref 22–32)
Calcium: 8.2 mg/dL — ABNORMAL LOW (ref 8.9–10.3)
Chloride: 101 mmol/L (ref 98–111)
Creatinine, Ser: 0.89 mg/dL (ref 0.61–1.24)
GFR, Estimated: >60 mL/min (ref >60)
Glucose, Bld: 95 mg/dL (ref 70–99)
Potassium: 3.8 mmol/L (ref 3.5–5.1)
Sodium: 136 mmol/L (ref 135–145)
Total Bilirubin: 2.2 mg/dL — ABNORMAL HIGH (ref 0.3–1.2)
Total Protein: 6.8 g/dL (ref 6.5–8.1)

## 2021-09-28 LAB — URINALYSIS, ROUTINE W REFLEX MICROSCOPIC
Bilirubin Urine: NEGATIVE
Glucose, UA: NEGATIVE mg/dL
Hgb urine dipstick: NEGATIVE
Ketones, ur: NEGATIVE mg/dL
Leukocytes,Ua: NEGATIVE
Nitrite: NEGATIVE
Protein, ur: NEGATIVE mg/dL
Specific Gravity, Urine: 1.025 (ref 1.005–1.030)
pH: 6 (ref 5.0–8.0)

## 2021-09-28 LAB — CBC
HCT: 46.9 % (ref 39.0–52.0)
Hemoglobin: 15.4 g/dL (ref 13.0–17.0)
MCH: 32.2 pg (ref 26.0–34.0)
MCHC: 32.8 g/dL (ref 30.0–36.0)
MCV: 98.1 fL (ref 80.0–100.0)
Platelets: 172 10*3/uL (ref 150–400)
RBC: 4.78 MIL/uL (ref 4.22–5.81)
RDW: 14.6 % (ref 11.5–15.5)
WBC: 8.5 10*3/uL (ref 4.0–10.5)
nRBC: 0 % (ref 0.0–0.2)

## 2021-09-28 MED ORDER — METHOCARBAMOL 500 MG PO TABS: 500.0000 mg | ORAL_TABLET | Freq: Three times a day (TID) | ORAL | 0 refills | Status: DC | PRN

## 2021-09-28 MED ORDER — LIDOCAINE 5 % EX PTCH: 1.0000 | MEDICATED_PATCH | CUTANEOUS | 0 refills | Status: DC

## 2021-09-28 MED ORDER — OXYCODONE HCL 5 MG PO TABS
5.0000 mg | ORAL_TABLET | Freq: Once | ORAL | Status: AC
  Administered 2021-09-28: 5 mg via ORAL
  Filled 2021-09-28: qty 1

## 2021-09-28 NOTE — Discharge Instructions (Signed)
You were evaluated in the Emergency Department and after careful evaluation, we did not find any emergent condition requiring admission or further testing in the hospital.  Your exam/testing today was overall reassuring.  Recommend continued follow-up with your primary care doctor to discuss your symptoms.  Please return to the Emergency Department if you experience any worsening of your condition.  Thank you for allowing [place] to be a part of your care.

## 2021-09-28 NOTE — ED Provider Notes (Signed)
[facility] Emergency Department Provider Note MRN:  [id number]  Arrival date & time: 09/28/21     Chief Complaint   Groin Pain   History of Present Illness   Ricardo Burch is a 60 y.o. year-old male with a history of CHF, CKD, COPD presenting to the ED with chief complaint of chronic pain.  Pain to the suprapubic region radiating to the groin and testicles for the past few days.  Foul-smelling urine.  Also has been having lower back pain and bilateral flank pain for the past several days.  Denies fever, no chest pain or shortness of breath, no numbness or weakness to the arms or legs, no urinary retention, no issues with bowel movements.  Symptoms constant, moderate, no exacerbating or alleviating factors.  Review of Systems  A complete 10 system review of systems was obtained and all systems are negative except as noted in the HPI and PMH.   Patient's Health History    Past Medical History:  Diagnosis Date   Allergy    CHF (congestive heart failure) (HCC)    Chronic kidney disease    COPD (chronic obstructive pulmonary disease) ([place]) Dx 2015   Depression    Eczema    since childhood    Erectile dysfunction 08/18/2019   GERD (gastroesophageal reflux disease) 01/25/2020   Hyperlipidemia 01/12/2018   Hypertension    Oxygen deficiency    Screening for HIV (human immunodeficiency virus)    Sleep apnea    CPAP   Substance abuse ([place])    MJ, cocaine   Testicular failure 07/22/2019    Past Surgical History:  Procedure Laterality Date   COLONOSCOPY WITH PROPOFOL N/A 06/25/2017   Procedure: COLONOSCOPY WITH PROPOFOL;  Surgeon: Daneil Dolin, MD;  [facility];  Service: Endoscopy;  Laterality: N/A;  2:00pm   MULTIPLE EXTRACTIONS WITH ALVEOLOPLASTY N/A 03/22/2016   Procedure: MULTIPLE EXTRACTION WITH ALVEOLOPLASTY;  Surgeon: Diona Browner, DDS;  Location: [facility];  Service: Oral Surgery;  Laterality: N/A;   NO PAST SURGERIES     POLYPECTOMY  06/25/2017    Procedure: POLYPECTOMY;  Surgeon: Daneil Dolin, MD;  [facility];  Service: Endoscopy;;  colon    Family History  Problem Relation Age of Onset   Arthritis Mother    Hypertension Mother    Stroke Mother        age 90   Cerebral aneurysm Father    Alcohol abuse Father    Cancer Brother    Diabetes Neg Hx    Heart disease Neg Hx     Social History   Socioeconomic History   Marital status: Single    Spouse name: Not on file   Number of children: 5   Years of education: 12   Highest education level: Not on file  Occupational History   Occupation: Unemployed     Comment: car wash details  Tobacco Use   Smoking status: Former    Packs/day: 1.00    Years: 38.00    Pack years: 38.00    Types: Cigarettes    Quit date: 02/21/2020    Years since quitting: 1.6   Smokeless tobacco: Never  Vaping Use   Vaping Use: Never used  Substance and Sexual Activity   Alcohol use: Not Currently    Alcohol/week: 1.0 standard drink    Types: 1 Cans of beer per week    Comment: Socially x 1/month currently (as of 01/25/20); previously: occassionally: weekends, 6-pack or less  on a day; or half pint of liquior   Drug use: Yes    Types: Marijuana, Cocaine    Comment: 01/25/20-marijuana few weeks ago, no cocaine   Sexual activity: Never  Other Topics Concern   Not on file  Social History Narrative    Lives alone.    5 daughters 98, 43 yo twins, eldest 2 are married live in [place]. Retired.Girlfriend works in Armed forces logistics/support/administrative officer.   Social Determinants of Health   Financial Resource Strain: Not on file  Food Insecurity: Not on file  Transportation Needs: Not on file  Physical Activity: Not on file  Stress: Not on file  Social Connections: Not on file  Intimate Partner Violence: Not on file     Physical Exam   Vitals:   09/28/21 0335 09/28/21 0530  BP: (!) 156/106 123/72  Pulse: 67 70  Resp: 18 18  Temp: 98.7 F (37.1 C)   SpO2: 98% 100%    CONSTITUTIONAL:  Well-appearing, NAD NEURO:  Alert and oriented x 3, no focal deficits EYES:  eyes equal and reactive ENT/NECK:  no LAD, no JVD CARDIO: Regular rate, well-perfused, normal S1 and S2 PULM:  CTAB no wheezing or rhonchi GI/GU:  normal bowel sounds, non-distended, non-tender MSK/SPINE:  No gross deformities, no edema SKIN:  no rash, atraumatic PSYCH:  Appropriate speech and behavior  *Additional and/or pertinent findings included in MDM below  Diagnostic and Interventional Summary    EKG Interpretation  Date/Time:    Ventricular Rate:    PR Interval:    QRS Duration:   QT Interval:    QTC Calculation:   R Axis:     Text Interpretation:         Labs Reviewed  COMPREHENSIVE METABOLIC PANEL - Abnormal; Notable for the following components:      Result Value   Calcium 8.2 (*)    Total Bilirubin 2.2 (*)    All other components within normal limits  URINE CULTURE  CBC  URINALYSIS, ROUTINE W REFLEX MICROSCOPIC    CT RENAL STONE STUDY  Final Result      Medications  oxyCODONE (Oxy IR/ROXICODONE) immediate release tablet 5 mg (5 mg Oral Given 09/28/21 0404)     Procedures  /  Critical Care Procedures  ED Course and Medical Decision Making  I have reviewed the triage vital signs, the nursing notes, and pertinent available records from the EMR.  Listed above are laboratory and imaging tests that I personally ordered, reviewed, and interpreted and then considered in my medical decision making (see below for details).  Suspect UTI versus pyelonephritis versus kidney stone, awaiting labs, CT.     Work-up is normal, normal labs, normal urine, normal CT scan.  Still a generally unexplained reason for patient's abnormal smelling urine.  Back pain likely explained by MSK given his long history of back pain.  No emergent process, no numbness or weakness to the legs, no cauda equina symptoms, patient is appropriate for discharge.  Barth Kirks. Sedonia Brendlinger, MD [facility] [email or web address]  Final Clinical Impressions(s) / ED Diagnoses     ICD-10-CM   1. Urine malodor  R82.90     2. Low back pain, unspecified back pain laterality, unspecified chronicity, unspecified whether sciatica present  M54.50       ED Discharge Orders          Ordered    methocarbamol (ROBAXIN) 500 MG tablet  Every 8 hours PRN  09/28/21 0633    lidocaine (LIDODERM) 5 %  Every 24 hours        09/28/21 6270             Discharge Instructions Discussed with and Provided to Patient:     Discharge Instructions      You were evaluated in the Emergency Department and after careful evaluation, we did not find any emergent condition requiring admission or further testing in the hospital.  Your exam/testing today was overall reassuring.  Recommend continued follow-up with your primary care doctor to discuss your symptoms.  Please return to the Emergency Department if you experience any worsening of your condition.  Thank you for allowing [place] to be a part of your care.         Maudie Flakes, MD 09/28/21 [phone]

## 2021-09-28 NOTE — ED Notes (Signed)
Patient transported to CT 

## 2021-09-28 NOTE — ED Triage Notes (Signed)
Pt c/o bilateral groin pain and bilateral flank pain that has gotten progressively worse over the last few weeks; pt states he has noticed a foul smell with his urine

## 2021-09-28 NOTE — ED Notes (Signed)
Patient left ED with ABCs intact, alert and oriented x4, respirations even and unlabored. Discharge instructions reviewed and all questions answered.   

## 2021-09-29 LAB — URINE CULTURE: Culture: NO GROWTH

## 2021-10-03 ENCOUNTER — Ambulatory Visit (INDEPENDENT_AMBULATORY_CARE_PROVIDER_SITE_OTHER): Payer: Medicare Other

## 2021-10-05 DIAGNOSIS — J449 Chronic obstructive pulmonary disease, unspecified: Secondary | ICD-10-CM | POA: Diagnosis not present

## 2021-10-10 ENCOUNTER — Ambulatory Visit (INDEPENDENT_AMBULATORY_CARE_PROVIDER_SITE_OTHER): Payer: Medicare Other

## 2021-10-17 ENCOUNTER — Ambulatory Visit (INDEPENDENT_AMBULATORY_CARE_PROVIDER_SITE_OTHER): Payer: Medicare Other

## 2021-10-24 ENCOUNTER — Ambulatory Visit (INDEPENDENT_AMBULATORY_CARE_PROVIDER_SITE_OTHER): Payer: Medicare Other

## 2021-10-31 ENCOUNTER — Ambulatory Visit (INDEPENDENT_AMBULATORY_CARE_PROVIDER_SITE_OTHER): Payer: Medicare Other

## 2021-11-05 DIAGNOSIS — J449 Chronic obstructive pulmonary disease, unspecified: Secondary | ICD-10-CM | POA: Diagnosis not present

## 2021-11-14 ENCOUNTER — Encounter: Payer: Self-pay | Admitting: Emergency Medicine

## 2021-11-14 ENCOUNTER — Ambulatory Visit
Admission: EM | Admit: 2021-11-14 | Discharge: 2021-11-14 | Disposition: A | Discharge status: Home / Self Care | Payer: Medicare Other | Attending: Urgent Care | Admitting: Urgent Care

## 2021-11-14 ENCOUNTER — Other Ambulatory Visit: Payer: Self-pay

## 2021-11-14 ENCOUNTER — Ambulatory Visit (INDEPENDENT_AMBULATORY_CARE_PROVIDER_SITE_OTHER): Payer: Medicare Other

## 2021-11-14 DIAGNOSIS — B349 Viral infection, unspecified: Secondary | ICD-10-CM | POA: Diagnosis not present

## 2021-11-14 DIAGNOSIS — R5383 Other fatigue: Secondary | ICD-10-CM | POA: Diagnosis not present

## 2021-11-14 DIAGNOSIS — Z8679 Personal history of other diseases of the circulatory system: Secondary | ICD-10-CM

## 2021-11-14 DIAGNOSIS — R051 Acute cough: Secondary | ICD-10-CM

## 2021-11-14 DIAGNOSIS — J449 Chronic obstructive pulmonary disease, unspecified: Secondary | ICD-10-CM | POA: Diagnosis not present

## 2021-11-14 DIAGNOSIS — I517 Cardiomegaly: Secondary | ICD-10-CM | POA: Diagnosis not present

## 2021-11-14 DIAGNOSIS — L03317 Cellulitis of buttock: Secondary | ICD-10-CM

## 2021-11-14 DIAGNOSIS — R5381 Other malaise: Secondary | ICD-10-CM | POA: Diagnosis not present

## 2021-11-14 MED ORDER — DOXYCYCLINE HYCLATE 100 MG PO CAPS: 100.0000 mg | ORAL_CAPSULE | Freq: Two times a day (BID) | ORAL | 0 refills | Status: DC

## 2021-11-14 MED ORDER — PREDNISONE 20 MG PO TABS: ORAL_TABLET | ORAL | 0 refills | Status: DC

## 2021-11-14 MED ORDER — ALBUTEROL SULFATE HFA 108 (90 BASE) MCG/ACT IN AERS: 1.0000 | INHALATION_SPRAY | RESPIRATORY_TRACT | 0 refills | Status: DC | PRN

## 2021-11-14 NOTE — Discharge Instructions (Signed)
We will notify you of your test results as they arrive and may take between 48-72 hours.  I encourage you to sign up for MyChart if you have not already done so as this can be the easiest way for [place] to communicate results to you online or through a phone app.  Generally, we only contact you if it is a positive test result.  In the meantime, if you develop worsening symptoms including fever, chest pain, shortness of breath despite our current treatment plan then please report to the emergency room as this may be a sign of worsening status from possible viral infection.  Otherwise, we will manage this as a viral syndrome. For sore throat or cough try using a honey-based tea. Use 3 teaspoons of honey with juice squeezed from half lemon. Place shaved pieces of ginger into 1/2-1 cup of water and warm over stove top. Then mix the ingredients and repeat every 4 hours as needed. Please take Tylenol 500mg -650mg  every 6 hours for aches and pains, fevers. Hydrate very well with at least 2 liters of water. Eat light meals such as soups to replenish electrolytes and soft fruits, veggies. Start an antihistamine like Zyrtec for postnasal drainage, sinus congestion.  You can take this together with prednisone to help in the setting of COPD. Make sure you are using your oxygen for good support of your lungs.

## 2021-11-14 NOTE — ED Provider Notes (Signed)
[place]   MRN: [id number] DOB: February 18, 1961  Subjective:   Ricardo Burch is a 61 y.o. male presenting for 3-4 day history of acute onset malaise, fatigue, lightheadedness, runny and stuffy nose. No cough, chest pain, shob, dyspnea. Has a history of COPD and is supposed to be on 2 liters of oxygen. Doesn't always use it but monitors his oxygen and has noted that it's been lower than normal so he has been using oxygen the past 2 days. Also has a history of CHF, last EF was 60% from an echo done in 2019. Has not had issues with it per patient. Has had 3 day history of acute onset buttock pain. He is worried it is an abscess.    Current Facility-Administered Medications:    testosterone cypionate (DEPOTESTOSTERONE CYPIONATE) injection 100 mg, 100 mg, Intramuscular, Q7 days, Gosrani, Nimish C, MD, 100 mg at 04/25/21 1006  Current Outpatient Medications:    albuterol (VENTOLIN HFA) 108 (90 Base) MCG/ACT inhaler, INHALE 1 OR 2 PUFFS BY MOUTH EVERY 6 HOURS AS NEEDED FOR WHEEZING OR SHORTNESS OF BREATH, Disp: 8.5 each, Rfl: 5   Budeson-Glycopyrrol-Formoterol (BREZTRI AEROSPHERE) 160-9-4.8 MCG/ACT AERO, INHALE 2 PUFFS BY MOUTH TWICE DAILY, Disp: 10.7 g, Rfl: 5   DALIRESP 500 MCG TABS tablet, TAKE 1 TABLET BY MOUTH DAILY, Disp: 30 tablet, Rfl: 5   fexofenadine-pseudoephedrine (ALLEGRA-D) 60-120 MG 12 hr tablet, Take 1 tablet by mouth 2 (two) times daily., Disp: , Rfl:    fluticasone (FLONASE) 50 MCG/ACT nasal spray, INSTILL 2 SPRAYS IN EACH NOSTRIL TWICE DAILY, Disp: 16 mL, Rfl: 5   OXYGEN, Inhale 3 L into the lungs daily. continuous, Disp: , Rfl:    pantoprazole (PROTONIX) 40 MG tablet, Take 1 tablet (40 mg total) by mouth daily., Disp: 90 tablet, Rfl: 3   amLODipine (NORVASC) 5 MG tablet, Take 1 tablet (5 mg total) by mouth daily., Disp: 90 tablet, Rfl: 0   Cholecalciferol (VITAMIN D-3) 125 MCG (5000 UT) TABS, Take 2 tablets by mouth daily., Disp: , Rfl:    ipratropium (ATROVENT) 0.06  % nasal spray, Place 1-2 sprays into the nose as directed., Disp: , Rfl:    lidocaine (LIDODERM) 5 %, Place 1 patch onto the skin daily. Remove & Discard patch within 12 hours or as directed by MD, Disp: 5 patch, Rfl: 0   methocarbamol (ROBAXIN) 500 MG tablet, Take 1 tablet (500 mg total) by mouth every 8 (eight) hours as needed for muscle spasms., Disp: 30 tablet, Rfl: 0   Allergies  Allergen Reactions   Apresoline [Hydralazine] Other (See Comments)    Headache     Past Medical History:  Diagnosis Date   Allergy    CHF (congestive heart failure) (HCC)    Chronic kidney disease    COPD (chronic obstructive pulmonary disease) ([place]) Dx 2015   Depression    Eczema    since childhood    Erectile dysfunction 08/18/2019   GERD (gastroesophageal reflux disease) 01/25/2020   Hyperlipidemia 01/12/2018   Hypertension    Oxygen deficiency    Screening for HIV (human immunodeficiency virus)    Sleep apnea    CPAP   Substance abuse ([place])    MJ, cocaine   Testicular failure 07/22/2019     Past Surgical History:  Procedure Laterality Date   COLONOSCOPY WITH PROPOFOL N/A 06/25/2017   Procedure: COLONOSCOPY WITH PROPOFOL;  Surgeon: Daneil Dolin, MD;  [facility];  Service: Endoscopy;  Laterality: N/A;  2:00pm  MULTIPLE EXTRACTIONS WITH ALVEOLOPLASTY N/A 03/22/2016   Procedure: MULTIPLE EXTRACTION WITH ALVEOLOPLASTY;  Surgeon: Diona Browner, DDS;  Location: [facility];  Service: Oral Surgery;  Laterality: N/A;   NO PAST SURGERIES     POLYPECTOMY  06/25/2017   Procedure: POLYPECTOMY;  Surgeon: Daneil Dolin, MD;  [facility];  Service: Endoscopy;;  colon    Family History  Problem Relation Age of Onset   Arthritis Mother    Hypertension Mother    Stroke Mother        age 62   Cerebral aneurysm Father    Alcohol abuse Father    Cancer Brother    Diabetes Neg Hx    Heart disease Neg Hx     Social History   Tobacco Use   Smoking status: Former    Packs/day:  1.00    Years: 38.00    Pack years: 38.00    Types: Cigarettes    Quit date: 02/21/2020    Years since quitting: 1.7   Smokeless tobacco: Never  Vaping Use   Vaping Use: Never used  Substance Use Topics   Alcohol use: Not Currently    Alcohol/week: 1.0 standard drink    Types: 1 Cans of beer per week    Comment: Socially x 1/month currently (as of 01/25/20); previously: occassionally: weekends, 6-pack or less on a day; or half pint of liquior   Drug use: Yes    Types: Marijuana, Cocaine    Comment: 01/25/20-marijuana few weeks ago, no cocaine    ROS   Objective:   Vitals: BP 140/89 (BP Location: Right Arm)    Temp 98.7 F (37.1 C) (Oral)    Ht 5\' 7"  (1.702 m)    Wt 260 lb (117.9 kg)    SpO2 96% Comment: 3 liters, usually wears all the time and hooks it up to cipap at night. pt reports "some days i dont have to wear it but last few days ive felt like i needed it."   BMI 40.72 kg/m   Physical Exam Constitutional:      General: He is not in acute distress.    Appearance: Normal appearance. He is well-developed. He is not ill-appearing, toxic-appearing or diaphoretic.  HENT:     Head: Normocephalic and atraumatic.     Right Ear: External ear normal.     Left Ear: External ear normal.     Nose: Nose normal.     Mouth/Throat:     Mouth: Mucous membranes are moist.  Eyes:     General: No scleral icterus.       Right eye: No discharge.        Left eye: No discharge.     Extraocular Movements: Extraocular movements intact.  Cardiovascular:     Rate and Rhythm: Normal rate and regular rhythm.     Heart sounds: Normal heart sounds. No murmur heard.   No friction rub. No gallop.  Pulmonary:     Effort: Pulmonary effort is normal. No respiratory distress.     Breath sounds: No stridor. No wheezing, rhonchi or rales.     Comments: Decreased bilaterally. Genitourinary:   Neurological:     Mental Status: He is alert and oriented to person, place, and time.  Psychiatric:         Mood and Affect: Mood normal.        Behavior: Behavior normal.        Thought Content: Thought content normal.  Judgment: Judgment normal.    DG Chest 2 View  Result Date: 11/14/2021 CLINICAL DATA:  Nasal congestion.  Fatigue. EXAM: CHEST - 2 VIEW COMPARISON:  05/03/2019 FINDINGS: The heart is enlarged. Aortic atherosclerosis as seen previously. No evidence of heart failure, infiltrate, collapse or effusion. Old healed rib fractures on the left. Chronically prominent pulmonary arteries. IMPRESSION: Cardiomegaly. Aortic atherosclerosis. Chronically prominent pulmonary arteries. No active finding by radiography. Electronically Signed   By: Nelson Chimes M.D.   On: 11/14/2021 14:01     Assessment and Plan :   PDMP not reviewed this encounter.  1. Viral syndrome   2. Acute cough   3. Malaise and fatigue   4. Chronic obstructive pulmonary disease, unspecified COPD type ([place])   5. History of CHF (congestive heart failure)   6. Cellulitis of buttock, right    Discussed with patient the risks of using prednisone in the setting of CHF.  Advised that the risks may outweigh the benefits given that he has hypoxia as seen in clinic dropped to low 90 to 91% without oxygen.  Therefore, patient would like to use the prednisone taking this into account.  Recommended that he continue oxygen, refilled his albuterol for his COPD.  Chest x-ray was negative.  Suspect an acute viral syndrome.  He does not have signs of an abscess over the buttock and therefore will use doxycycline for this.  Recommend supportive care otherwise. Counseled patient on potential for adverse effects with medications prescribed/recommended today, ER and return-to-clinic precautions discussed, patient verbalized understanding.    Jaynee Eagles, PA-C 11/14/21 1421

## 2021-11-14 NOTE — ED Triage Notes (Addendum)
Pt reports runny nose, nasal congestion, lightheadedness, fatigue for last several days. Pt afebrile and reports diarrhea started this am. Pt reports usually pretty active and doesn't always wear oxygen but reports no energy today and "ive felt like ive needed it more often the last few days". Pt denies chest pain, shortness of breath at this time.   Pt also reports "boil" on buttocks that is hurting and would like assessed.

## 2021-11-15 LAB — COVID-19, FLU A+B NAA
Influenza A, NAA: NOT DETECTED
Influenza B, NAA: NOT DETECTED
SARS-CoV-2, NAA: NOT DETECTED

## 2021-11-24 LAB — HM HEPATITIS C SCREENING LAB: HM Hepatitis Screen: NEGATIVE

## 2021-11-28 ENCOUNTER — Other Ambulatory Visit: Payer: Self-pay

## 2021-11-28 ENCOUNTER — Ambulatory Visit (INDEPENDENT_AMBULATORY_CARE_PROVIDER_SITE_OTHER): Payer: Medicare Other | Admitting: Internal Medicine

## 2021-11-28 ENCOUNTER — Encounter: Payer: Self-pay | Admitting: Internal Medicine

## 2021-11-28 ENCOUNTER — Telehealth: Payer: Self-pay | Admitting: Pulmonary Disease

## 2021-11-28 VITALS — BP 142/98 | HR 93 | Resp 18 | Ht 69.0 in | Wt 262.1 lb

## 2021-11-28 DIAGNOSIS — I272 Pulmonary hypertension, unspecified: Secondary | ICD-10-CM

## 2021-11-28 DIAGNOSIS — J432 Centrilobular emphysema: Secondary | ICD-10-CM | POA: Diagnosis not present

## 2021-11-28 DIAGNOSIS — E559 Vitamin D deficiency, unspecified: Secondary | ICD-10-CM

## 2021-11-28 DIAGNOSIS — I700 Atherosclerosis of aorta: Secondary | ICD-10-CM | POA: Diagnosis not present

## 2021-11-28 DIAGNOSIS — M5136 Other intervertebral disc degeneration, lumbar region: Secondary | ICD-10-CM | POA: Insufficient documentation

## 2021-11-28 DIAGNOSIS — M545 Low back pain, unspecified: Secondary | ICD-10-CM

## 2021-11-28 DIAGNOSIS — F331 Major depressive disorder, recurrent, moderate: Secondary | ICD-10-CM

## 2021-11-28 DIAGNOSIS — Z9989 Dependence on other enabling machines and devices: Secondary | ICD-10-CM

## 2021-11-28 DIAGNOSIS — J9611 Chronic respiratory failure with hypoxia: Secondary | ICD-10-CM

## 2021-11-28 DIAGNOSIS — R5382 Chronic fatigue, unspecified: Secondary | ICD-10-CM

## 2021-11-28 DIAGNOSIS — G4733 Obstructive sleep apnea (adult) (pediatric): Secondary | ICD-10-CM | POA: Diagnosis not present

## 2021-11-28 DIAGNOSIS — R7303 Prediabetes: Secondary | ICD-10-CM

## 2021-11-28 DIAGNOSIS — I10 Essential (primary) hypertension: Secondary | ICD-10-CM | POA: Diagnosis not present

## 2021-11-28 DIAGNOSIS — I5032 Chronic diastolic (congestive) heart failure: Secondary | ICD-10-CM | POA: Diagnosis not present

## 2021-11-28 DIAGNOSIS — G8929 Other chronic pain: Secondary | ICD-10-CM

## 2021-11-28 MED ORDER — SERTRALINE HCL 25 MG PO TABS: 25.0000 mg | ORAL_TABLET | Freq: Every day | ORAL | 2 refills | Status: DC

## 2021-11-28 MED ORDER — AMLODIPINE BESYLATE 5 MG PO TABS: 5.0000 mg | ORAL_TABLET | Freq: Every day | ORAL | 1 refills | Status: DC

## 2021-11-28 NOTE — Assessment & Plan Note (Signed)
[facility] Office Visit from 11/28/2021 in [facility]  PHQ-9 Total Score 9     Likely due to chronic medical conditions Referred to [facility] therapy Started Zoloft 25 mg daily for now

## 2021-11-28 NOTE — Assessment & Plan Note (Signed)
Uses continuous home O2 at 3 LPM Due to COPD 

## 2021-11-28 NOTE — Assessment & Plan Note (Signed)
Recent echo at [facility] showed normal LVEF and pulmonary HTN. Currently appears euvolemic

## 2021-11-28 NOTE — Assessment & Plan Note (Signed)
Could be due to MDD and other chronic medical conditions Will also check CBC, CMP, TSH, vitamin D and B12 levels

## 2021-11-28 NOTE — Assessment & Plan Note (Signed)
Uses CPAP regularly, continues to benefit from it 

## 2021-11-28 NOTE — Assessment & Plan Note (Signed)
Noted on echo Likely due to COPD Followed by pulmonology and is going to see pulmonary HTN clinic at [facility]

## 2021-11-28 NOTE — Assessment & Plan Note (Signed)
BP Readings from Last 1 Encounters:  11/28/21 (!) 142/98   uncontrolled due to noncompliance Refilled amlodipine Counseled for compliance with the medications Advised DASH diet and moderate exercise/walking as tolerated

## 2021-11-28 NOTE — Assessment & Plan Note (Addendum)
Chronic, intermittent, worse in the morning No red flags currently Advised to perform simple back stretching exercises Tylenol as needed Back brace as needed

## 2021-11-28 NOTE — Patient Instructions (Addendum)
Please start taking Amlodipine as prescribed.  Please start taking Zoloft for depression.  Please follow low salt diet and ambulate as tolerated.  Please get fasting blood tests done before the next visit.

## 2021-11-28 NOTE — Progress Notes (Signed)
New Patient Office Visit  Subjective:  Patient ID: Ricardo Burch, male    DOB: 1961/10/16  Age: 61 y.o. MRN: [id number]  CC:  Chief Complaint  Patient presents with   New Patient (Initial Visit)    New patient was seeing dr Anastasio Champion pt has been having lower back pain when he wakes up for awhile now also pt has no energy     HPI Ricardo Burch is a 61 y.o. male with past medical history of HTN, COPD, chronic hypoxic respiratory failure, OSA, GERD and chronic fatigue who presents for establishing care.  HTN: His BP was elevated in the office today upon multiple measurements.  Of note, he had stopped taking amlodipine.  He denies any headache, dizziness or chest pain currently.  Chronic hypoxic respiratory failure and COPD: He uses Breztri and as needed albuterol for COPD and follows up with Dr. Vaughan Browner.  He is being evaluated for lung reduction surgery.  Recently, his echo showed pulmonary HTN, for which he was referred to pulmonology HTN clinic at [facility].  He currently uses home O2 at 3 LPM.  He has chronic dyspnea.  He was recently in ER for mild COPD exacerbation, and was given prednisone for it.  He feels better now.  He denies any fever, chills, nausea or vomiting currently.  He takes pantoprazole for GERD.  He denies any dysphagia or odynophagia currently.  He complains of chronic fatigue, and was placed on testosterone supplement in the past.  Chart review suggests that he did not tolerate it and was having more fatigue with it.  He denies any recent weight loss.  Of note, he feels depressed on some days due to his COPD as he is not able to do activities that he used to do.  She denies insomnia, but does report lack of interest in routine activities.  He currently lives with his girlfriend and recently proposed her.  He wants to feel better to get ready for marriage.   Past Medical History:  Diagnosis Date   Allergy    CHF (congestive heart failure) (HCC)    Chronic kidney disease    COPD  (chronic obstructive pulmonary disease) ([place]) Dx 2015   Depression    Eczema    since childhood    Erectile dysfunction 08/18/2019   GERD (gastroesophageal reflux disease) 01/25/2020   Hyperlipidemia 01/12/2018   Hypertension    Oxygen deficiency    Screening for HIV (human immunodeficiency virus)    Sleep apnea    CPAP   Substance abuse ([place])    MJ, cocaine   Testicular failure 07/22/2019    Past Surgical History:  Procedure Laterality Date   COLONOSCOPY WITH PROPOFOL N/A 06/25/2017   Procedure: COLONOSCOPY WITH PROPOFOL;  Surgeon: Daneil Dolin, MD;  [facility];  Service: Endoscopy;  Laterality: N/A;  2:00pm   MULTIPLE EXTRACTIONS WITH ALVEOLOPLASTY N/A 03/22/2016   Procedure: MULTIPLE EXTRACTION WITH ALVEOLOPLASTY;  Surgeon: Diona Browner, DDS;  Location: [place];  Service: Oral Surgery;  Laterality: N/A;   NO PAST SURGERIES     POLYPECTOMY  06/25/2017   Procedure: POLYPECTOMY;  Surgeon: Daneil Dolin, MD;  [facility];  Service: Endoscopy;;  colon    Family History  Problem Relation Age of Onset   Arthritis Mother    Hypertension Mother    Stroke Mother        age 53   Cerebral aneurysm Father    Alcohol abuse Father    Cancer  Brother    Diabetes Neg Hx    Heart disease Neg Hx     Social History   Socioeconomic History   Marital status: Single    Spouse name: Not on file   Number of children: 5   Years of education: 12   Highest education level: Not on file  Occupational History   Occupation: Unemployed     Comment: car wash details  Tobacco Use   Smoking status: Former    Packs/day: 1.00    Years: 38.00    Pack years: 38.00    Types: Cigarettes    Quit date: 02/21/2020    Years since quitting: 1.7   Smokeless tobacco: Never  Vaping Use   Vaping Use: Never used  Substance and Sexual Activity   Alcohol use: Not Currently    Alcohol/week: 1.0 standard drink    Types: 1 Cans of beer per week    Comment: Socially x 1/month  currently (as of 01/25/20); previously: occassionally: weekends, 6-pack or less on a day; or half pint of liquior   Drug use: Yes    Types: Marijuana, Cocaine    Comment: 01/25/20-marijuana few weeks ago, no cocaine   Sexual activity: Never  Other Topics Concern   Not on file  Social History Narrative    Lives alone.    5 daughters 75, 43 yo twins, eldest 2 are married live in [place]. Retired.Girlfriend works in Armed forces logistics/support/administrative officer.   Social Determinants of Health   Financial Resource Strain: Not on file  Food Insecurity: Not on file  Transportation Needs: Not on file  Physical Activity: Not on file  Stress: Not on file  Social Connections: Not on file  Intimate Partner Violence: Not on file    ROS Review of Systems  Constitutional:  Positive for fatigue. Negative for chills and fever.  HENT:  Negative for congestion and sore throat.   Eyes:  Negative for pain and discharge.  Respiratory:  Positive for shortness of breath. Negative for cough.   Cardiovascular:  Negative for chest pain and palpitations.  Gastrointestinal:  Negative for diarrhea, nausea and vomiting.  Endocrine: Negative for polydipsia and polyuria.  Genitourinary:  Negative for dysuria and hematuria.  Musculoskeletal:  Positive for back pain. Negative for neck pain and neck stiffness.  Skin:  Negative for rash.  Neurological:  Negative for dizziness, weakness, numbness and headaches.  Psychiatric/Behavioral:  Positive for dysphoric mood. Negative for agitation and behavioral problems.    Objective:   Today's Vitals: BP (!) 142/98 (BP Location: Right Arm, Cuff Size: Normal)    Pulse 93    Resp 18    Ht $R'5\' 9"'oN$  (1.753 m)    Wt 262 lb 1.9 oz (118.9 kg)    SpO2 94%    BMI 38.71 kg/m   Physical Exam Vitals reviewed.  Constitutional:      General: He is not in acute distress.    Appearance: He is not diaphoretic.  HENT:     Head: Normocephalic and atraumatic.     Nose: Nose normal.     Mouth/Throat:     Mouth:  Mucous membranes are moist.  Eyes:     General: No scleral icterus.    Extraocular Movements: Extraocular movements intact.  Cardiovascular:     Rate and Rhythm: Normal rate and regular rhythm.     Pulses: Normal pulses.     Heart sounds: Normal heart sounds. No murmur heard. Pulmonary:     Breath sounds: Normal breath sounds.  No wheezing or rales.  Musculoskeletal:     Cervical back: Neck supple. No tenderness.     Right lower leg: No edema.     Left lower leg: No edema.  Skin:    General: Skin is warm.     Findings: No rash.  Neurological:     General: No focal deficit present.     Mental Status: He is alert and oriented to person, place, and time.     Sensory: No sensory deficit.     Motor: No weakness.  Psychiatric:        Mood and Affect: Mood normal.        Behavior: Behavior normal.    Assessment & Plan:   Problem List Items Addressed This Visit       Cardiovascular and Mediastinum   Aortic atherosclerosis ([place])    Noted on CT chest BP elevated, needs to be compliant with antihypertensive Check lipid profile      Relevant Medications   amLODipine (NORVASC) 5 MG tablet   Other Relevant Orders   Lipid Profile   Hypertension - Primary    BP Readings from Last 1 Encounters:  11/28/21 (!) 142/98  uncontrolled due to noncompliance Refilled amlodipine Counseled for compliance with the medications Advised DASH diet and moderate exercise/walking as tolerated      Relevant Medications   amLODipine (NORVASC) 5 MG tablet   Other Relevant Orders   CMP14+EGFR   CBC with Differential/Platelet   Lipid Profile   Chronic diastolic CHF (congestive heart failure) (HCC)    Recent echo at [facility] showed normal LVEF and pulmonary HTN. Currently appears euvolemic      Relevant Medications   amLODipine (NORVASC) 5 MG tablet   Pulmonary hypertension, moderate to severe (HCC)    Noted on echo Likely due to COPD Followed by pulmonology and is going to see pulmonary HTN  clinic at [facility]      Relevant Medications   amLODipine (NORVASC) 5 MG tablet     Respiratory   COPD (chronic obstructive pulmonary disease) (HCC) (Chronic)    On Breztri and as needed albuterol On Daliresp Followed by pulmonology- Dr. Vaughan Browner Was referred to [facility] for lung reduction surgery, undergoing eval currently      Chronic respiratory failure with hypoxia ([place])    Uses continuous home O2 at 3 LPM Due to COPD      OSA on CPAP    Uses CPAP regularly, continues to benefit from it        Other   Depression    Flowsheet Row Office Visit from 11/28/2021 in [facility]  PHQ-9 Total Score 9  Likely due to chronic medical conditions Referred to [facility] therapy Started Zoloft 25 mg daily for now      Relevant Medications   sertraline (ZOLOFT) 25 MG tablet   Other Relevant Orders   AMB Referral to [place]   Pre-diabetes   Relevant Orders   Hemoglobin A1c   Morbid obesity ([place])    Diet modification and ambulation as tolerated      Relevant Orders   TSH   Chronic fatigue    Could be due to MDD and other chronic medical conditions Will also check CBC, CMP, TSH, vitamin D and B12 levels      Relevant Orders   B12   Chronic low back pain without sciatica    Chronic, intermittent, worse in the morning No red flags currently Advised to perform simple back stretching exercises Tylenol  as needed Back brace as needed      Relevant Medications   sertraline (ZOLOFT) 25 MG tablet   Other Visit Diagnoses     Vitamin D deficiency       Relevant Orders   VITAMIN D 25 Hydroxy (Vit-D Deficiency, Fractures)       Outpatient Encounter Medications as of 11/28/2021  Medication Sig   albuterol (VENTOLIN HFA) 108 (90 Base) MCG/ACT inhaler Inhale 1-2 puffs into the lungs every 4 (four) hours as needed for wheezing or shortness of breath.   Budeson-Glycopyrrol-Formoterol (BREZTRI AEROSPHERE) 160-9-4.8 MCG/ACT AERO INHALE 2 PUFFS BY MOUTH TWICE DAILY    DALIRESP 500 MCG TABS tablet TAKE 1 TABLET BY MOUTH DAILY   fluticasone (FLONASE) 50 MCG/ACT nasal spray INSTILL 2 SPRAYS IN EACH NOSTRIL TWICE DAILY   ipratropium (ATROVENT) 0.06 % nasal spray Place 1-2 sprays into the nose as directed.   methocarbamol (ROBAXIN) 500 MG tablet Take 1 tablet (500 mg total) by mouth every 8 (eight) hours as needed for muscle spasms.   OXYGEN Inhale 3 L into the lungs daily. continuous   pantoprazole (PROTONIX) 40 MG tablet Take 1 tablet (40 mg total) by mouth daily.   predniSONE (DELTASONE) 20 MG tablet Take 2 tablets daily with breakfast.   sertraline (ZOLOFT) 25 MG tablet Take 1 tablet (25 mg total) by mouth daily.   amLODipine (NORVASC) 5 MG tablet Take 1 tablet (5 mg total) by mouth daily.   Cholecalciferol (VITAMIN D-3) 125 MCG (5000 UT) TABS Take 2 tablets by mouth daily. (Patient not taking: Reported on 11/28/2021)   lidocaine (LIDODERM) 5 % Place 1 patch onto the skin daily. Remove & Discard patch within 12 hours or as directed by MD (Patient not taking: Reported on 11/28/2021)   [DISCONTINUED] amLODipine (NORVASC) 5 MG tablet Take 1 tablet (5 mg total) by mouth daily. (Patient not taking: Reported on 11/28/2021)   [DISCONTINUED] doxycycline (VIBRAMYCIN) 100 MG capsule Take 1 capsule (100 mg total) by mouth 2 (two) times daily. (Patient not taking: Reported on 11/28/2021)   [DISCONTINUED] fexofenadine-pseudoephedrine (ALLEGRA-D) 60-120 MG 12 hr tablet Take 1 tablet by mouth 2 (two) times daily. (Patient not taking: Reported on 11/28/2021)   [DISCONTINUED] testosterone cypionate (DEPOTESTOSTERONE CYPIONATE) injection 100 mg    No facility-administered encounter medications on file as of 11/28/2021.    Follow-up: Return in about 3 months (around 02/26/2022) for Annual physical.   [name], MD

## 2021-11-28 NOTE — Assessment & Plan Note (Signed)
On Breztri and as needed albuterol On Daliresp Followed by pulmonology- Dr. Vaughan Browner Was referred to [facility] for lung reduction surgery, undergoing eval currently

## 2021-11-28 NOTE — Assessment & Plan Note (Signed)
Diet modification and ambulation as tolerated

## 2021-11-28 NOTE — Assessment & Plan Note (Signed)
Noted on CT chest BP elevated, needs to be compliant with antihypertensive Check lipid profile 

## 2021-11-28 NOTE — Telephone Encounter (Signed)
Called and spoke with patient.  Patient requested a new cpap machine order to be placed to [facility]. Patient stated machine is over 59 years old.  Patient is scheduled 12/03/21 at 2:45pm with Dr. Vaughan Browner for follow up.  Message routed to Dr. Vaughan Browner to advise on cpap order

## 2021-11-29 NOTE — Telephone Encounter (Signed)
Ok to place CPAP order

## 2021-12-03 ENCOUNTER — Other Ambulatory Visit: Payer: Self-pay

## 2021-12-03 ENCOUNTER — Encounter: Payer: Self-pay | Admitting: Pulmonary Disease

## 2021-12-03 ENCOUNTER — Ambulatory Visit (INDEPENDENT_AMBULATORY_CARE_PROVIDER_SITE_OTHER): Payer: Medicare Other | Admitting: Pulmonary Disease

## 2021-12-03 VITALS — BP 126/80 | HR 83 | Temp 98.4°F | Ht 72.0 in | Wt 262.0 lb

## 2021-12-03 DIAGNOSIS — J449 Chronic obstructive pulmonary disease, unspecified: Secondary | ICD-10-CM | POA: Diagnosis not present

## 2021-12-03 DIAGNOSIS — G4733 Obstructive sleep apnea (adult) (pediatric): Secondary | ICD-10-CM | POA: Diagnosis not present

## 2021-12-03 DIAGNOSIS — Z9989 Dependence on other enabling machines and devices: Secondary | ICD-10-CM | POA: Diagnosis not present

## 2021-12-03 NOTE — Progress Notes (Signed)
Ricardo Burch    [id number]    1961-09-19  Primary Care Physician:Patel, Colin Broach, MD  Referring Physician: [name], MD [address] [place],  [place] [address]  Chief complaint:   Follow up for Severe COPD Severe OSA on autoset Active smoker  HPI: [name] is a 61 year old active smoker, COPD. He's had multiple hospitalizations with COPD, CHF exacerbations in the past.He was hospitalized from 07/26/16-07/30/16 with acute pulmonary edema, acute exacerbation of COPD. He was intubated briefly for hypercarbia.   He has finished the pulmonary rehab and reports improved dyspnea. He has been on a rotating list of inhalers mainly due to his inability to pay co-pay. [name] He has been started on CPAP after a repeat titration study and is tolerating it well. Stop smoking in March 2021  Interim History: Continues on CPAP Continues on breztri, Daliresp.  Has stable chronic dyspnea on exertion On supplemental oxygen  Referred to [facility] for consideration of endobronchial lung volume reduction procedure.  He was evaluated there and referred to pulmonary hypertension clinic as Dr. [name] wanted that evaluated before doing the procedure  Outpatient Encounter Medications as of 12/03/2021  Medication Sig   albuterol (VENTOLIN HFA) 108 (90 Base) MCG/ACT inhaler Inhale 1-2 puffs into the lungs every 4 (four) hours as needed for wheezing or shortness of breath.   amLODipine (NORVASC) 5 MG tablet Take 1 tablet (5 mg total) by mouth daily.   Budeson-Glycopyrrol-Formoterol (BREZTRI AEROSPHERE) 160-9-4.8 MCG/ACT AERO INHALE 2 PUFFS BY MOUTH TWICE DAILY   DALIRESP 500 MCG TABS tablet TAKE 1 TABLET BY MOUTH DAILY   fluticasone (FLONASE) 50 MCG/ACT nasal spray INSTILL 2 SPRAYS IN EACH NOSTRIL TWICE DAILY   methocarbamol (ROBAXIN) 500 MG tablet Take 1 tablet (500 mg total) by mouth every 8 (eight) hours as needed for muscle spasms.   OXYGEN Inhale 3 L into the lungs daily. continuous   lidocaine  (LIDODERM) 5 % Place 1 patch onto the skin daily. Remove & Discard patch within 12 hours or as directed by MD (Patient not taking: Reported on 11/28/2021)   sertraline (ZOLOFT) 25 MG tablet Take 1 tablet (25 mg total) by mouth daily. (Patient not taking: Reported on 12/03/2021)   [DISCONTINUED] Cholecalciferol (VITAMIN D-3) 125 MCG (5000 UT) TABS Take 2 tablets by mouth daily. (Patient not taking: Reported on 11/28/2021)   [DISCONTINUED] ipratropium (ATROVENT) 0.06 % nasal spray Place 1-2 sprays into the nose as directed.   [DISCONTINUED] pantoprazole (PROTONIX) 40 MG tablet Take 1 tablet (40 mg total) by mouth daily.   [DISCONTINUED] predniSONE (DELTASONE) 20 MG tablet Take 2 tablets daily with breakfast.   No facility-administered encounter medications on file as of 12/03/2021.   Physical Exam: Blood pressure 126/80, pulse 83, temperature 98.4 F (36.9 C), temperature source Oral, height 6' (1.829 m), weight 262 lb (118.8 kg), SpO2 90 %. Gen:      No acute distress HEENT:  EOMI, sclera anicteric Neck:     No masses; no thyromegaly Lungs:    Clear to auscultation bilaterally; normal respiratory effort CV:         Regular rate and rhythm; no murmurs Abd:      + bowel sounds; soft, non-tender; no palpable masses, no distension Ext:    No edema; adequate peripheral perfusion Skin:      Warm and dry; no rash Neuro: alert and oriented x 3 Psych: normal mood and affect   Data Reviewed: Imaging Screening CT chest  07/14/2019-emphysema, subcentimeter pulmonary nodule.  Atelectasis along the right major fissure.  Screening CT chest 08/21/2020-emphysema, upper lobe predominant.  Stable pulmonary nodules CT chest at [facility] 08/07/2021-stable pulmonary nodules I have reviewed the images personally.  PFTs  01/16/16 FVC 2.43 [58%], FEV1 1.49 [45%), F/F 61, TLC 81%,DLCO 56% Severe obstruction, moderate reduction in diffusion capacity.  11/27/2020 FVC 2.04 [56%], FEV1 1.08 [37%], F/F 53, TLC 6.36 [99%], DLCO  12.91, 50%] Severe obstruction with air trapping and bronchodilator response.  Severe diffusion defect.  Sleep PSG 10/22/15  evere OSA, AHI 151. Started on an AutoSet CPAP titration study  Initiate CPAP at 12 with 2 L oxygen, fullface mask.   CPAP compliance 09/27/2020 100% compliant with good response   Labs CBC 08/24/17-absolute eosinophil count 254 CBC 06/13/2019-WBC 12.7, eos 1%, absolute eosinophil count 127 Alpha-1 antitrypsin 06/15/2019-134, PI MM  Cardiac Echo (09/18/15) The right ventricular systolic pressure was increased consistent  with moderate pulmonary hypertension.moderate concentric LVH. LVEF 60-65 percent. Grade 2 diastolic dysfunction. RV cavity size severe grade dilated. RV systolic function moderately to severely reduced. PA pressure 56  Assessment:  Severe COPD Continues on Daliresp, Bevespi Continue supplemental oxygen Work on weight loss with diet and exercise. Has finished pulmonary rehab twice overall.  Encouraged him to stay active at home.  He is under consideration for lung volume reduction with endobronchial valve at [facility] for that.  If turned down then consider lung transplant evaluation.  Severe OSA Stable on CPAP.  Continue with current setting of 12. Encouraged him to use it every day.  Download reviewed with good compliance and response He is inquiring about inspire device but his BMI is too high.  Needs to work on weight loss   Stable lung nodules Continue low-dose screening CTs He had a CT at [facility] at October 2022 which showed stable lung nodules.  Next screening CT due in October 2023    Pulmonary hypertension Likely group 3 pulmonary hypertension secondary to COPD, OSA.   Due to be seen in pulmonary hypertension clinic in [facility] maintenance Up-to-date with Covid booster. Up-to-date with flu and pneumonia vaccination  Plan/Recommendations: - Continue inhalers, Daliresp, supplemental oxygen - Continue CPAP - Screening CT of  the chest to resume in October 2023  Marshell Garfinkel MD [facility] 12/03/2021, 3:06 PM  CC: [name], MD

## 2021-12-03 NOTE — Patient Instructions (Signed)
Glad you are stable with your breathing Continue current therapy and follow-up with [facility] as planned Return to clinic in 6 months

## 2021-12-04 ENCOUNTER — Telehealth: Payer: Self-pay | Admitting: *Deleted

## 2021-12-04 ENCOUNTER — Other Ambulatory Visit: Payer: Self-pay

## 2021-12-04 ENCOUNTER — Telehealth: Payer: Self-pay | Admitting: Pulmonary Disease

## 2021-12-04 DIAGNOSIS — F17210 Nicotine dependence, cigarettes, uncomplicated: Secondary | ICD-10-CM

## 2021-12-04 DIAGNOSIS — G4733 Obstructive sleep apnea (adult) (pediatric): Secondary | ICD-10-CM

## 2021-12-04 DIAGNOSIS — Z87891 Personal history of nicotine dependence: Secondary | ICD-10-CM

## 2021-12-04 NOTE — Chronic Care Management (AMB) (Signed)
Chronic Care Management   Note  12/04/2021 Name: Ricardo Burch MRN: [id number] DOB: 1961/07/03  Ricardo Burch is a 61 y.o. year old male who is a primary care patient of [name], MD. I reached out to Ricardo Burch by phone today in response to a referral sent by Ricardo Burch PCP.  Ricardo Burch was given information about Chronic Care Management services today including:  CCM service includes personalized support from designated clinical staff supervised by his physician, including individualized plan of care and coordination with other care providers 24/7 contact phone numbers for assistance for urgent and routine care needs. Service will only be billed when office clinical staff spend 20 minutes or more in a month to coordinate care. Only one practitioner may furnish and bill the service in a calendar month. The patient may stop CCM services at any time (effective at the end of the month) by phone call to the office staff. The patient is responsible for co-pay (up to 20% after annual deductible is met) if co-pay is required by the individual health plan.   Patient agreed to services and verbal consent obtained.   Follow up plan: Telephone appointment with care management team member scheduled for:12/28/21  [facility]  Direct Dial: [phone]

## 2021-12-04 NOTE — Telephone Encounter (Signed)
Called and spoke with Brad, [employer].  Leroy Sea stated he pushed patient cpap order through, but stated insurance will require order with cpap settings.  Per last cpap order and split night patient cpap setting was 12cm with 2 L O2 bled through.   New DME order placed with settings.  Nothing further at this time.

## 2021-12-05 ENCOUNTER — Other Ambulatory Visit: Payer: Self-pay

## 2021-12-05 ENCOUNTER — Ambulatory Visit
Admission: EM | Admit: 2021-12-05 | Discharge: 2021-12-05 | Disposition: A | Discharge status: Home / Self Care | Payer: Medicare Other | Attending: Urgent Care | Admitting: Urgent Care

## 2021-12-05 DIAGNOSIS — L720 Epidermal cyst: Secondary | ICD-10-CM | POA: Diagnosis not present

## 2021-12-05 MED ORDER — DOXYCYCLINE HYCLATE 100 MG PO CAPS: 100.0000 mg | ORAL_CAPSULE | Freq: Two times a day (BID) | ORAL | 0 refills | Status: DC

## 2021-12-05 NOTE — ED Triage Notes (Signed)
Patient states that since Tuesday morning he woke up with an abscess on his chest   Patient states it is painful and itchy   Patient states that he feels like its getting better but it is very itchy.   Patient states that the swelling did go down some since Tuesday  Patient is supposed to be on continuous O2 but refuses to wear it   Denies Meds

## 2021-12-05 NOTE — ED Provider Notes (Signed)
[place]   MRN: [id number] DOB: 10/04/1961  Subjective:   Ricardo Burch is a 61 y.o. male presenting for 2-day history of acute onset persistent and worsening pain over the chest wall.  He has concerns about having an infection like an abscess.  Has previously been told that he has cysts but does not follow with dermatologist.  No fevers, nausea, vomiting.  No current facility-administered medications for this encounter.  Current Outpatient Medications:    albuterol (VENTOLIN HFA) 108 (90 Base) MCG/ACT inhaler, Inhale 1-2 puffs into the lungs every 4 (four) hours as needed for wheezing or shortness of breath., Disp: 18 g, Rfl: 0   amLODipine (NORVASC) 5 MG tablet, Take 1 tablet (5 mg total) by mouth daily., Disp: 90 tablet, Rfl: 1   Budeson-Glycopyrrol-Formoterol (BREZTRI AEROSPHERE) 160-9-4.8 MCG/ACT AERO, INHALE 2 PUFFS BY MOUTH TWICE DAILY, Disp: 10.7 g, Rfl: 5   DALIRESP 500 MCG TABS tablet, TAKE 1 TABLET BY MOUTH DAILY, Disp: 30 tablet, Rfl: 5   fluticasone (FLONASE) 50 MCG/ACT nasal spray, INSTILL 2 SPRAYS IN EACH NOSTRIL TWICE DAILY, Disp: 16 mL, Rfl: 5   lidocaine (LIDODERM) 5 %, Place 1 patch onto the skin daily. Remove & Discard patch within 12 hours or as directed by MD (Patient not taking: Reported on 11/28/2021), Disp: 5 patch, Rfl: 0   methocarbamol (ROBAXIN) 500 MG tablet, Take 1 tablet (500 mg total) by mouth every 8 (eight) hours as needed for muscle spasms., Disp: 30 tablet, Rfl: 0   OXYGEN, Inhale 3 L into the lungs daily. continuous, Disp: , Rfl:    sertraline (ZOLOFT) 25 MG tablet, Take 1 tablet (25 mg total) by mouth daily. (Patient not taking: Reported on 12/03/2021), Disp: 30 tablet, Rfl: 2   Allergies  Allergen Reactions   Apresoline [Hydralazine] Other (See Comments)    Headache     Past Medical History:  Diagnosis Date   Allergy    CHF (congestive heart failure) (HCC)    Chronic kidney disease    COPD (chronic obstructive pulmonary disease)  ([place]) Dx 2015   Depression    Eczema    since childhood    Erectile dysfunction 08/18/2019   GERD (gastroesophageal reflux disease) 01/25/2020   Hyperlipidemia 01/12/2018   Hypertension    Oxygen deficiency    Screening for HIV (human immunodeficiency virus)    Sleep apnea    CPAP   Substance abuse ([place])    MJ, cocaine   Testicular failure 07/22/2019     Past Surgical History:  Procedure Laterality Date   COLONOSCOPY WITH PROPOFOL N/A 06/25/2017   Procedure: COLONOSCOPY WITH PROPOFOL;  Surgeon: Daneil Dolin, MD;  [facility];  Service: Endoscopy;  Laterality: N/A;  2:00pm   MULTIPLE EXTRACTIONS WITH ALVEOLOPLASTY N/A 03/22/2016   Procedure: MULTIPLE EXTRACTION WITH ALVEOLOPLASTY;  Surgeon: Diona Browner, DDS;  Location: [facility];  Service: Oral Surgery;  Laterality: N/A;   NO PAST SURGERIES     POLYPECTOMY  06/25/2017   Procedure: POLYPECTOMY;  Surgeon: Daneil Dolin, MD;  [facility];  Service: Endoscopy;;  colon    Family History  Problem Relation Age of Onset   Arthritis Mother    Hypertension Mother    Stroke Mother        age 69   Cerebral aneurysm Father    Alcohol abuse Father    Cancer Brother    Diabetes Neg Hx    Heart disease Neg Hx     Social History  Tobacco Use   Smoking status: Former    Packs/day: 1.00    Years: 38.00    Pack years: 38.00    Types: Cigarettes    Quit date: 02/21/2020    Years since quitting: 1.7   Smokeless tobacco: Never  Vaping Use   Vaping Use: Never used  Substance Use Topics   Alcohol use: Yes    Alcohol/week: 1.0 standard drink    Types: 1 Cans of beer per week    Comment: Socially x 1/month currently (as of 01/25/20); previously: occassionally: weekends, 6-pack or less on a day; or half pint of liquior   Drug use: Yes    Types: Marijuana, Cocaine    Comment: 01/25/20-marijuana few weeks ago, no cocaine    ROS   Objective:   Vitals: BP (!) 149/91 (BP Location: Right Arm)    Pulse 80    Temp  98.3 F (36.8 C) (Oral)    Resp 18    SpO2 91%   Physical Exam Constitutional:      General: He is not in acute distress.    Appearance: Normal appearance. He is well-developed and normal weight. He is not ill-appearing, toxic-appearing or diaphoretic.  HENT:     Head: Normocephalic and atraumatic.     Right Ear: External ear normal.     Left Ear: External ear normal.     Nose: Nose normal.     Mouth/Throat:     Pharynx: Oropharynx is clear.  Eyes:     General: No scleral icterus.       Right eye: No discharge.        Left eye: No discharge.     Extraocular Movements: Extraocular movements intact.  Cardiovascular:     Rate and Rhythm: Normal rate.  Pulmonary:     Effort: Pulmonary effort is normal.  Chest:    Musculoskeletal:     Cervical back: Normal range of motion.  Neurological:     Mental Status: He is alert and oriented to person, place, and time.  Psychiatric:        Mood and Affect: Mood normal.        Behavior: Behavior normal.        Thought Content: Thought content normal.        Judgment: Judgment normal.       Assessment and Plan :   PDMP not reviewed this encounter.  1. Epidermoid cyst    Area is not amenable to incision and drainage.  I suspect that he does have an infected cyst but as there is no area of fluctuance and has multiple nearby surrounding cysts that are likely tracking one another will defer incision and drainage.  Recommended follow-up with a dermatologist.  Start doxycycline, use warm compresses.  Tylenol for pain relief. Counseled patient on potential for adverse effects with medications prescribed/recommended today, ER and return-to-clinic precautions discussed, patient verbalized understanding.    Jaynee Eagles, PA-C 12/05/21 1429

## 2021-12-06 DIAGNOSIS — J449 Chronic obstructive pulmonary disease, unspecified: Secondary | ICD-10-CM | POA: Diagnosis not present

## 2021-12-07 ENCOUNTER — Encounter: Payer: Self-pay | Admitting: *Deleted

## 2021-12-17 DIAGNOSIS — J449 Chronic obstructive pulmonary disease, unspecified: Secondary | ICD-10-CM | POA: Diagnosis not present

## 2021-12-17 DIAGNOSIS — G4733 Obstructive sleep apnea (adult) (pediatric): Secondary | ICD-10-CM | POA: Diagnosis not present

## 2021-12-19 DIAGNOSIS — L720 Epidermal cyst: Secondary | ICD-10-CM | POA: Diagnosis not present

## 2021-12-24 ENCOUNTER — Other Ambulatory Visit: Payer: Self-pay

## 2021-12-24 ENCOUNTER — Ambulatory Visit: Payer: Medicare Other

## 2021-12-25 DIAGNOSIS — Z87891 Personal history of nicotine dependence: Secondary | ICD-10-CM | POA: Diagnosis not present

## 2021-12-25 DIAGNOSIS — G4733 Obstructive sleep apnea (adult) (pediatric): Secondary | ICD-10-CM | POA: Diagnosis not present

## 2021-12-25 DIAGNOSIS — J432 Centrilobular emphysema: Secondary | ICD-10-CM | POA: Diagnosis not present

## 2021-12-25 DIAGNOSIS — I4510 Unspecified right bundle-branch block: Secondary | ICD-10-CM | POA: Diagnosis not present

## 2021-12-25 DIAGNOSIS — R9431 Abnormal electrocardiogram [ECG] [EKG]: Secondary | ICD-10-CM | POA: Diagnosis not present

## 2021-12-25 DIAGNOSIS — Z79899 Other long term (current) drug therapy: Secondary | ICD-10-CM | POA: Diagnosis not present

## 2021-12-25 DIAGNOSIS — I272 Pulmonary hypertension, unspecified: Secondary | ICD-10-CM | POA: Diagnosis not present

## 2021-12-25 DIAGNOSIS — R0602 Shortness of breath: Secondary | ICD-10-CM | POA: Diagnosis not present

## 2021-12-28 ENCOUNTER — Ambulatory Visit (INDEPENDENT_AMBULATORY_CARE_PROVIDER_SITE_OTHER): Payer: Medicare Other

## 2021-12-28 DIAGNOSIS — F331 Major depressive disorder, recurrent, moderate: Secondary | ICD-10-CM

## 2021-12-28 DIAGNOSIS — J9690 Respiratory failure, unspecified, unspecified whether with hypoxia or hypercapnia: Secondary | ICD-10-CM | POA: Diagnosis not present

## 2021-12-28 DIAGNOSIS — I10 Essential (primary) hypertension: Secondary | ICD-10-CM

## 2021-12-28 DIAGNOSIS — J449 Chronic obstructive pulmonary disease, unspecified: Secondary | ICD-10-CM | POA: Diagnosis not present

## 2021-12-28 DIAGNOSIS — G4733 Obstructive sleep apnea (adult) (pediatric): Secondary | ICD-10-CM | POA: Diagnosis not present

## 2021-12-28 DIAGNOSIS — I272 Pulmonary hypertension, unspecified: Secondary | ICD-10-CM | POA: Diagnosis not present

## 2021-12-28 DIAGNOSIS — I5032 Chronic diastolic (congestive) heart failure: Secondary | ICD-10-CM

## 2021-12-28 NOTE — Patient Instructions (Addendum)
Visit Information  Patient Goals:  Depressive Symptoms identified. Manage Depression issues  Time Frame:  Short Term Goal Progress: On Track Priority:  Medium  Start Date:  12/28/21 End Date:  03/22/22  Follow Up Date:  02/08/22 at 2:00 PM  Depressive symptoms identified.  Manage Depression issues  Patient Self Care Activities:  Takes medications as prescribed Attends scheduled medical appointments  Patient Coping Strengths:  Support from [name]  Patient Self Care Deficits:  Short of breath on exertion Depression challenges  Patient Goals:  - spend time or talk with others at least 2 to 3 times per week - practice relaxation or meditation daily - keep a calendar with appointment dates  Follow Up Plan: LCSW to call client on 02/08/22 at 2:00 PM   Norva Riffle.Reata Petrov MSW, [place] [employer] representative [facility] Care Management [phone]

## 2021-12-28 NOTE — Chronic Care Management (AMB) (Signed)
Chronic Care Management    Clinical Social Work Note  12/28/2021 Name: Ricardo Burch MRN: [id number] DOB: October 30, 1961  Ricardo Burch is a 61 y.o. year old male who is a primary care patient of [name], MD. The CCM team was consulted to assist the patient with chronic disease management and/or care coordination needs related to: [employer] .   Engaged with patient by telephone for initial visit in response to provider referral for social work chronic care management and care coordination services.   Consent to Services:  The patient was given the following information about Chronic Care Management services today, agreed to services, and gave verbal consent: 1. CCM service includes personalized support from designated clinical staff supervised by the primary care provider, including individualized plan of care and coordination with other care providers 2. 24/7 contact phone numbers for assistance for urgent and routine care needs. 3. Service will only be billed when office clinical staff spend 20 minutes or more in a month to coordinate care. 4. Only one practitioner may furnish and bill the service in a calendar month. 5.The patient may stop CCM services at any time (effective at the end of the month) by phone call to the office staff. 6. The patient will be responsible for cost sharing (co-pay) of up to 20% of the service fee (after annual deductible is met). Patient agreed to services and consent obtained.  Patient agreed to services and consent obtained.   Assessment: Review of patient past medical history, allergies, medications, and health status, including review of relevant consultants reports was performed today as part of a comprehensive evaluation and provision of chronic care management and care coordination services.     SDOH (Social Determinants of Health) assessments and interventions performed:  SDOH Interventions    Flowsheet Row Most Recent Value  SDOH Interventions    Stress Interventions Provide Counseling  [client has stress related to managing medical needs. client has stress related to finances]  Depression Interventions/Treatment  Medication, Counseling        Advanced Directives Status: See [employer]yn[employer] application for related entries.  CCM Care Plan  Allergies  Allergen Reactions   Apresoline [Hydralazine] Other (See Comments)    Headache     Outpatient Encounter Medications as of 12/28/2021  Medication Sig   albuterol (VENTOLIN HFA) 108 (90 Base) MCG/ACT inhaler Inhale 1-2 puffs into the lungs every 4 (four) hours as needed for wheezing or shortness of breath.   amLODipine (NORVASC) 5 MG tablet Take 1 tablet (5 mg total) by mouth daily.   Budeson-Glycopyrrol-Formoterol (BREZTRI AEROSPHERE) 160-9-4.8 MCG/ACT AERO INHALE 2 PUFFS BY MOUTH TWICE DAILY   DALIRESP 500 MCG TABS tablet TAKE 1 TABLET BY MOUTH DAILY   doxycycline (VIBRAMYCIN) 100 MG capsule Take 1 capsule (100 mg total) by mouth 2 (two) times daily.   fluticasone (FLONASE) 50 MCG/ACT nasal spray INSTILL 2 SPRAYS IN EACH NOSTRIL TWICE DAILY   lidocaine (LIDODERM) 5 % Place 1 patch onto the skin daily. Remove & Discard patch within 12 hours or as directed by MD (Patient not taking: Reported on 11/28/2021)   methocarbamol (ROBAXIN) 500 MG tablet Take 1 tablet (500 mg total) by mouth every 8 (eight) hours as needed for muscle spasms.   OXYGEN Inhale 3 L into the lungs daily. continuous   sertraline (ZOLOFT) 25 MG tablet Take 1 tablet (25 mg total) by mouth daily. (Patient not taking: Reported on 12/03/2021)   No facility-administered encounter medications on file as of 12/28/2021.  Patient Active Problem List   Diagnosis Date Noted   Chronic fatigue 11/28/2021   Chronic low back pain without sciatica 11/28/2021   Lung nodule 08/30/2021   Pulmonary hypertension, moderate to severe ([place]) 08/28/2021   GERD (gastroesophageal reflux disease) 01/25/2020   Bloating 01/25/2020   Morbid obesity  ([place]) 12/06/2019   Erectile dysfunction 08/18/2019   Chronic diastolic CHF (congestive heart failure) ([place]) 09/23/2018   Hyperlipidemia 01/12/2018   Hypertension 10/14/2017   Aortic atherosclerosis ([place]) 09/15/2017   Pre-diabetes 09/15/2017   Cocaine abuse ([place]) 07/26/2016   Depression 07/26/2016   OSA on CPAP 07/26/2016   Alcohol abuse 06/27/2016   COPD (chronic obstructive pulmonary disease) ([place]) 08/23/2014   Eczema 08/23/2014   Right knee pain 08/23/2014   Tobacco abuse 03/27/2014   Chronic respiratory failure with hypoxia ([place]) 03/27/2014    Conditions to be addressed/monitored: monitor client management of depression issues  Care Plan : LCSW Care Plan  Updates made by Katha Cabal, LCSW since 12/28/2021 12:00 AM     Problem: Emotional Distress      Goal: Emotional Health Supported Manage Depression issues   Start Date: 12/28/2021  Expected End Date: 03/21/2022  This Visit's Progress: On track  Priority: Medium  Note:   Current Barriers:  Depression issues Financial challenges Shortness of breath on exertion Suicidal Ideation/Homicidal Ideation: No  Clinical Social Work Goal(s):  patient will work with SW monthly by telephone or in person to reduce or manage symptoms related to depression and depression issues Patient will attend scheduled medical appointments in next 30 days Patient will use self care techniques in next 30 days to manage stress (eat meals at routine times, get adequate rest, go fishing when able, play computer games, take a walk) Encouraged client to call RNCM as needed in next 30 days for nursing support  Interventions: Patient interviewed and appropriate assessments performed: GAD-7; PHQ 2/9 1:1 collaboration with [name], MD regarding development and update of comprehensive plan of care as evidenced by provider attestation and co-signature Discussed medication procurement of client Reviewed transport needs of client Discussed  client shortness of breath on exertion. Client has in home oxygen equipment and client has portable oxygen equipment Discussed sleeping issues. Client  uses C-PAP at night to help him with breathing Discussed support with fiance, Thedore Mins Discussed ambulation of client .He walks without using a device. He does use oxygen when walking Discussed mood of client. He said he is sad sometimes. He takes Zoloft as prescribed. Talked with Milbert Coulter about relaxation techniques (go fishing, watch TV, play computer games, go for walk) Encouraged Milbert Coulter to talk with [facility] about nursing needs of client Provided counseling support for [name]  Patient Self Care Activities:  Takes medications as prescribed Attends scheduled medical appointments  Patient Coping Strengths:  Support from [name]  Patient Self Care Deficits:  Short of breath on exertion Depression challenges  Patient Goals:  - spend time or talk with others at least 2 to 3 times per week - practice relaxation or meditation daily - keep a calendar with appointment dates  Follow Up Plan: LCSW to call client on 02/08/22 at 2:00 PM    Norva Riffle.Sherrey North MSW, [place] Holiday representative [facility] Care Management [phone]

## 2021-12-31 ENCOUNTER — Other Ambulatory Visit: Payer: Self-pay

## 2021-12-31 ENCOUNTER — Ambulatory Visit (INDEPENDENT_AMBULATORY_CARE_PROVIDER_SITE_OTHER): Payer: Medicare Other | Admitting: Family Medicine

## 2021-12-31 DIAGNOSIS — Z0000 Encounter for general adult medical examination without abnormal findings: Secondary | ICD-10-CM | POA: Diagnosis not present

## 2021-12-31 NOTE — Progress Notes (Signed)
Subjective:   Ricardo Burch is a 61 y.o. male who presents for an Initial Medicare Annual Wellness Visit.  Review of Systems    I connected with  Ricardo Burch on 12/31/21 by a audio enabled telemedicine application and verified that I am speaking with the correct person using two identifiers.  Patient Location: Home  Provider Location: Office/Clinic  I discussed the limitations of evaluation and management by telemedicine. The patient expressed understanding and agreed to proceed.        Objective:    Today's Vitals   12/31/21 1109  PainSc: 0-No pain   There is no height or weight on file to calculate BMI.  Advanced Directives 12/31/2021 09/28/2021 02/14/2020 11/05/2019 08/23/2019 08/23/2019 05/03/2019  Does Patient Have a Medical Advance Directive? No No No No No No No  Type of Advance Directive - - - - - - -  Does patient want to make changes to medical advance directive? No - Patient declined - - - - - -  Would patient like information on creating a medical advance directive? No - Patient declined - No - Patient declined - No - Patient declined No - Patient declined -  Pre-existing out of facility DNR order (yellow form or pink MOST form) - - - - - - -    Current Medications (verified) Outpatient Encounter Medications as of 12/31/2021  Medication Sig   albuterol (VENTOLIN HFA) 108 (90 Base) MCG/ACT inhaler Inhale 1-2 puffs into the lungs every 4 (four) hours as needed for wheezing or shortness of breath.   amLODipine (NORVASC) 5 MG tablet Take 1 tablet (5 mg total) by mouth daily.   Budeson-Glycopyrrol-Formoterol (BREZTRI AEROSPHERE) 160-9-4.8 MCG/ACT AERO INHALE 2 PUFFS BY MOUTH TWICE DAILY   DALIRESP 500 MCG TABS tablet TAKE 1 TABLET BY MOUTH DAILY   doxycycline (VIBRAMYCIN) 100 MG capsule Take 1 capsule (100 mg total) by mouth 2 (two) times daily.   fluticasone (FLONASE) 50 MCG/ACT nasal spray INSTILL 2 SPRAYS IN EACH NOSTRIL TWICE DAILY   lidocaine (LIDODERM) 5 % Place 1 patch  onto the skin daily. Remove & Discard patch within 12 hours or as directed by MD (Patient not taking: Reported on 11/28/2021)   methocarbamol (ROBAXIN) 500 MG tablet Take 1 tablet (500 mg total) by mouth every 8 (eight) hours as needed for muscle spasms.   OXYGEN Inhale 3 L into the lungs daily. continuous   sertraline (ZOLOFT) 25 MG tablet Take 1 tablet (25 mg total) by mouth daily. (Patient not taking: Reported on 12/03/2021)   No facility-administered encounter medications on file as of 12/31/2021.    Allergies (verified) Apresoline [hydralazine]   History: Past Medical History:  Diagnosis Date   Allergy    CHF (congestive heart failure) (HCC)    Chronic kidney disease    COPD (chronic obstructive pulmonary disease) ([place]) Dx 2015   Depression    Eczema    since childhood    Erectile dysfunction 08/18/2019   GERD (gastroesophageal reflux disease) 01/25/2020   Hyperlipidemia 01/12/2018   Hypertension    Oxygen deficiency    Screening for HIV (human immunodeficiency virus)    Sleep apnea    CPAP   Substance abuse ([place])    MJ, cocaine   Testicular failure 07/22/2019   Past Surgical History:  Procedure Laterality Date   COLONOSCOPY WITH PROPOFOL N/A 06/25/2017   Procedure: COLONOSCOPY WITH PROPOFOL;  Surgeon: Daneil Dolin, MD;  [facility];  Service: Endoscopy;  Laterality: N/A;  2:00pm  MULTIPLE EXTRACTIONS WITH ALVEOLOPLASTY N/A 03/22/2016   Procedure: MULTIPLE EXTRACTION WITH ALVEOLOPLASTY;  Surgeon: Diona Browner, DDS;  Location: [facility];  Service: Oral Surgery;  Laterality: N/A;   NO PAST SURGERIES     POLYPECTOMY  06/25/2017   Procedure: POLYPECTOMY;  Surgeon: Daneil Dolin, MD;  [facility];  Service: Endoscopy;;  colon   Family History  Problem Relation Age of Onset   Arthritis Mother    Hypertension Mother    Stroke Mother        age 33   Cerebral aneurysm Father    Alcohol abuse Father    Cancer Brother    Diabetes Neg Hx    Heart disease  Neg Hx    Social History   Socioeconomic History   Marital status: Single    Spouse name: Not on file   Number of children: 5   Years of education: 12   Highest education level: Not on file  Occupational History   Occupation: Unemployed     Comment: car wash details  Tobacco Use   Smoking status: Former    Packs/day: 1.00    Years: 38.00    Pack years: 38.00    Types: Cigarettes    Quit date: 02/21/2020    Years since quitting: 1.8   Smokeless tobacco: Never  Vaping Use   Vaping Use: Never used  Substance and Sexual Activity   Alcohol use: Yes    Alcohol/week: 1.0 standard drink    Types: 1 Cans of beer per week    Comment: Socially x 1/month currently (as of 01/25/20); previously: occassionally: weekends, 6-pack or less on a day; or half pint of liquior   Drug use: Yes    Types: Marijuana, Cocaine    Comment: 01/25/20-marijuana few weeks ago, no cocaine   Sexual activity: Never  Other Topics Concern   Not on file  Social History Narrative    Lives alone.    5 daughters 66, 23 yo twins, eldest 2 are married live in [place]. Retired.Girlfriend works in Armed forces logistics/support/administrative officer.   Social Determinants of Health   Financial Resource Strain: Low Risk    Difficulty of Paying Living Expenses: Not very hard  Food Insecurity: No Food Insecurity   Worried About Charity fundraiser in the Last Year: Never true   Ran Out of Food in the Last Year: Never true  Transportation Needs: No Transportation Needs   Lack of Transportation (Medical): No   Lack of Transportation (Non-Medical): No  Physical Activity: Insufficiently Active   Days of Exercise per Week: 3 days   Minutes of Exercise per Session: 10 min  Stress: No Stress Concern Present   Feeling of Stress : Only a little  Social Connections: Moderately Integrated   Frequency of Communication with Friends and Family: More than three times a week   Frequency of Social Gatherings with Friends and Family: Three times a week   Attends  Religious Services: More than 4 times per year   Active Member of Clubs or Organizations: No   Attends Archivist Meetings: Never   Marital Status: Living with partner    Tobacco Counseling Counseling given: Not Answered   Clinical Intake:  Pre-visit preparation completed: No  Pain : No/denies pain Pain Score: 0-No pain     Nutritional Risks: None Diabetes: Yes  How often do you need to have someone help you when you read instructions, pamphlets, or other written materials from your doctor or pharmacy?:  1 - Never What is the last grade level you completed in school?: 12th  Diabetic?No  Interpreter Needed?: No      Activities of Daily Living No flowsheet data found.  Patient Care Team: [name], MD as PCP - General (Internal Medicine) Herminio Commons, MD (Inactive) as PCP - Cardiology (Cardiology) Gala Romney Cristopher Estimable, MD as Consulting Physician (Gastroenterology) Shea Evans Norva Riffle, LCSW as [place] (Licensed Clinical Social Worker)  Indicate any recent [place] you may have received from other than [facility] providers in the past year (date may be approximate).     Assessment:   This is a routine wellness examination for Shrihaan.  Hearing/Vision screen No results found.  Dietary issues and exercise activities discussed:     Goals Addressed   None    Depression Screen PHQ 2/9 Scores 12/31/2021 12/31/2021 12/28/2021 11/28/2021 11/28/2021 01/24/2021 07/27/2020  PHQ - 2 Score 0 1 2 2 1  0 2  PHQ- 9 Score 0 1 6 9  - 0 3  Exception Documentation - - - - - - -    Fall Risk Fall Risk  12/31/2021 11/28/2021 11/23/2020 01/12/2020 05/26/2018  Falls in the past year? 0 0 0 0 No  Number falls in past yr: 0 0 - 0 -  Injury with Fall? 0 0 - 0 -  Risk for fall due to : No Fall Risks No Fall Risks - No Fall Risks -  Risk for fall due to: Comment - - - - -  Follow up Education provided Falls evaluation completed Falls evaluation  completed Falls evaluation completed -    FALL RISK PREVENTION PERTAINING TO THE HOME:  Any stairs in or around the home? Yes  If so, are there any without handrails? Yes  Home free of loose throw rugs in walkways, pet beds, electrical cords, etc? No  Adequate lighting in your home to reduce risk of falls? Yes   ASSISTIVE DEVICES UTILIZED TO PREVENT FALLS:  Life alert? No  Use of a cane, walker or w/c? No  Grab bars in the bathroom? No  Shower chair or bench in shower? No  Elevated toilet seat or a handicapped toilet? No   Cognitive Function:     6CIT Screen 12/31/2021  What Year? 0 points  What month? 0 points  What time? 0 points  Count back from 20 0 points  Months in reverse 0 points  Repeat phrase 0 points  Total Score 0    Immunizations Immunization History  Administered Date(s) Administered   Influenza Inj Mdck Quad With Preservative 08/09/2020   Influenza, High Dose Seasonal PF 06/29/2018   Influenza,inj,Quad PF,6+ Mos 09/15/2017, 08/25/2019, 08/25/2019, 11/02/2021   Moderna Sars-Covid-2 Vaccination 01/24/2020, 02/21/2020, 10/03/2020   Pneumococcal Conjugate-13 09/15/2017   Pneumococcal Polysaccharide-23 06/15/2019   Tdap 05/26/2015   Zoster Recombinat (Shingrix) 11/04/2020    TDAP status: Up to date  Flu Vaccine status: Up to date  Pneumococcal vaccine status: Up to date  Covid-19 vaccine status: Completed vaccines  Qualifies for Shingles Vaccine? Yes   Zostavax completed No   Shingrix Completed?: No.    Education has been provided regarding the importance of this vaccine. Patient has been advised to call insurance company to determine out of pocket expense if they have not yet received this vaccine. Advised may also receive vaccine at local pharmacy or Health Dept. Verbalized acceptance and understanding.  Screening Tests Health Maintenance  Topic Date Due   COVID-19 Vaccine (4 - Booster for  Moderna series) 11/28/2020   Zoster Vaccines- Shingrix (2  of 2) 12/30/2020   TETANUS/TDAP  05/25/2025   COLONOSCOPY (Pts 45-46yrs Insurance coverage will need to be confirmed)  06/26/2027   INFLUENZA VACCINE  Completed   Hepatitis C Screening  Completed   HIV Screening  Completed   HPV VACCINES  Aged Out    Health Maintenance  Health Maintenance Due  Topic Date Due   COVID-19 Vaccine (4 - Booster for Moderna series) 11/28/2020   Zoster Vaccines- Shingrix (2 of 2) 12/30/2020    Colorectal cancer screening: Type of screening: Colonoscopy. Completed 06/25/2017. Repeat every 10 years  Lung Cancer Screening: (Low Dose CT Chest recommended if Age 43-80 years, 30 pack-year currently smoking OR have quit w/in 15years.) does not qualify.   Lung Cancer Screening Referral: n/a  Additional Screening:  Hepatitis C Screening: does qualify; Completed 11/24/2021  Vision Screening: Recommended annual ophthalmology exams for early detection of glaucoma and other disorders of the eye. Is the patient up to date with their annual eye exam?  Yes  Who is the provider or what is the name of the office in which the patient attends annual eye exams? [facility] If pt is not established with a provider, would they like to be referred to a provider to establish care? No .   Dental Screening: Recommended annual dental exams for proper oral hygiene      Plan:     I have personally reviewed and noted the following in the patients chart:   Medical and social history Use of alcohol, tobacco or illicit drugs  Current medications and supplements including opioid prescriptions. Patient is not currently taking opioid prescriptions. Functional ability and status Nutritional status Physical activity Advanced directives List of other physicians Hospitalizations, surgeries, and ER visits in previous 12 months Vitals Screenings to include cognitive, depression, and falls Referrals and appointments  In addition, I have reviewed and  discussed with patient certain preventive protocols, quality metrics, and best practice recommendations. A written personalized care plan for preventive services as well as general preventive health recommendations were provided to patient.     Kathlyn Sacramento, RN   12/31/2021   Nurse Notes:  Mr. Delrossi , Thank you for taking time to come for your Medicare Wellness Visit. I appreciate your ongoing commitment to your health goals. Please review the following plan we discussed and let me know if I can assist you in the future.   These are the goals we discussed:  Goals       Depressive Symptoms Identified. Manage Depression issues (pt-stated)      Time Frame:  Short Term Goal Progress: On Track Priority:  Medium  Start Date:  12/28/21 End Date:  03/22/22  Follow Up Date:  02/08/22 at 2:00 PM  Depressive symptoms identified.  Manage Depression issues  Patient Self Care Activities:  Takes medications as prescribed Attends scheduled medical appointments  Patient Coping Strengths:  Support from [name]  Patient Self Care Deficits:  Short of breath on exertion Depression challenges  Patient Goals:  - spend time or talk with others at least 2 to 3 times per week - practice relaxation or meditation daily - keep a calendar with appointment dates  Follow Up Plan: LCSW to call client on 02/08/22 at 2:00 PM       Quit smoking / using tobacco      Weight (lb) < 200 lb (90.7 kg)  This is a list of the screening recommended for you and due dates:  Health Maintenance  Topic Date Due   COVID-19 Vaccine (4 - Booster for Moderna series) 11/28/2020   Zoster (Shingles) Vaccine (2 of 2) 12/30/2020   Tetanus Vaccine  05/25/2025   Colon Cancer Screening  06/26/2027   Flu Shot  Completed   Hepatitis C Screening: USPSTF Recommendation to screen - Ages 18-79 yo.  Completed   HIV Screening  Completed   HPV Vaccine  Aged Out

## 2021-12-31 NOTE — Patient Instructions (Signed)
°  Ricardo Burch , Thank you for taking time to come for your Medicare Wellness Visit. I appreciate your ongoing commitment to your health goals. Please review the following plan we discussed and let me know if I can assist you in the future.   These are the goals we discussed:  Goals       Depressive Symptoms Identified. Manage Depression issues (pt-stated)      Time Frame:  Short Term Goal Progress: On Track Priority:  Medium  Start Date:  12/28/21 End Date:  03/22/22  Follow Up Date:  02/08/22 at 2:00 PM  Depressive symptoms identified.  Manage Depression issues  Patient Self Care Activities:  Takes medications as prescribed Attends scheduled medical appointments  Patient Coping Strengths:  Support from Ricardo Burch  Patient Self Care Deficits:  Short of breath on exertion Depression challenges  Patient Goals:  - spend time or talk with others at least 2 to 3 times per week - practice relaxation or meditation daily - keep a calendar with appointment dates  Follow Up Plan: LCSW to call client on 02/08/22 at 2:00 PM       Quit smoking / using tobacco      Weight (lb) < 200 lb (90.7 kg)        This is a list of the screening recommended for you and due dates:  Health Maintenance  Topic Date Due   COVID-19 Vaccine (4 - Booster for Moderna series) 11/28/2020   Zoster (Shingles) Vaccine (2 of 2) 12/30/2020   Tetanus Vaccine  05/25/2025   Colon Cancer Screening  06/26/2027   Flu Shot  Completed   Hepatitis C Screening: USPSTF Recommendation to screen - Ages 18-79 yo.  Completed   HIV Screening  Completed   HPV Vaccine  Aged Out

## 2022-01-01 DIAGNOSIS — I5032 Chronic diastolic (congestive) heart failure: Secondary | ICD-10-CM

## 2022-01-01 DIAGNOSIS — I10 Essential (primary) hypertension: Secondary | ICD-10-CM

## 2022-01-01 DIAGNOSIS — F331 Major depressive disorder, recurrent, moderate: Secondary | ICD-10-CM

## 2022-01-03 DIAGNOSIS — J449 Chronic obstructive pulmonary disease, unspecified: Secondary | ICD-10-CM | POA: Diagnosis not present

## 2022-01-07 ENCOUNTER — Other Ambulatory Visit: Payer: Self-pay | Admitting: Pulmonary Disease

## 2022-01-11 ENCOUNTER — Other Ambulatory Visit: Payer: Self-pay | Admitting: Pulmonary Disease

## 2022-01-11 DIAGNOSIS — J438 Other emphysema: Secondary | ICD-10-CM

## 2022-01-24 DIAGNOSIS — I270 Primary pulmonary hypertension: Secondary | ICD-10-CM | POA: Diagnosis not present

## 2022-01-24 DIAGNOSIS — Z01810 Encounter for preprocedural cardiovascular examination: Secondary | ICD-10-CM | POA: Diagnosis not present

## 2022-01-24 DIAGNOSIS — J449 Chronic obstructive pulmonary disease, unspecified: Secondary | ICD-10-CM | POA: Diagnosis not present

## 2022-01-24 DIAGNOSIS — I5032 Chronic diastolic (congestive) heart failure: Secondary | ICD-10-CM | POA: Diagnosis not present

## 2022-01-24 DIAGNOSIS — R0602 Shortness of breath: Secondary | ICD-10-CM | POA: Diagnosis not present

## 2022-01-24 DIAGNOSIS — I509 Heart failure, unspecified: Secondary | ICD-10-CM | POA: Diagnosis not present

## 2022-01-29 ENCOUNTER — Encounter (HOSPITAL_COMMUNITY): Payer: Self-pay

## 2022-01-29 ENCOUNTER — Emergency Department (HOSPITAL_COMMUNITY)
Admission: EM | Admit: 2022-01-29 | Discharge: 2022-01-29 | Disposition: A | Discharge status: Home / Self Care | Payer: Medicare Other | Attending: Emergency Medicine | Admitting: Emergency Medicine

## 2022-01-29 ENCOUNTER — Other Ambulatory Visit: Payer: Self-pay

## 2022-01-29 DIAGNOSIS — L0231 Cutaneous abscess of buttock: Secondary | ICD-10-CM | POA: Insufficient documentation

## 2022-01-29 DIAGNOSIS — J449 Chronic obstructive pulmonary disease, unspecified: Secondary | ICD-10-CM | POA: Diagnosis not present

## 2022-01-29 DIAGNOSIS — Z23 Encounter for immunization: Secondary | ICD-10-CM | POA: Insufficient documentation

## 2022-01-29 DIAGNOSIS — I509 Heart failure, unspecified: Secondary | ICD-10-CM | POA: Diagnosis not present

## 2022-01-29 DIAGNOSIS — Z7951 Long term (current) use of inhaled steroids: Secondary | ICD-10-CM | POA: Insufficient documentation

## 2022-01-29 DIAGNOSIS — L0291 Cutaneous abscess, unspecified: Secondary | ICD-10-CM

## 2022-01-29 MED ORDER — FENTANYL CITRATE PF 50 MCG/ML IJ SOSY
50.0000 ug | PREFILLED_SYRINGE | Freq: Once | INTRAMUSCULAR | Status: AC
  Administered 2022-01-29: 50 ug via INTRAMUSCULAR
  Filled 2022-01-29: qty 1

## 2022-01-29 MED ORDER — LIDOCAINE-EPINEPHRINE (PF) 2 %-1:200000 IJ SOLN
10.0000 mL | Freq: Once | INTRAMUSCULAR | Status: AC
  Administered 2022-01-29: 10 mL
  Filled 2022-01-29: qty 20

## 2022-01-29 MED ORDER — TETANUS-DIPHTH-ACELL PERTUSSIS 5-2.5-18.5 LF-MCG/0.5 IM SUSY
0.5000 mL | PREFILLED_SYRINGE | Freq: Once | INTRAMUSCULAR | Status: AC
  Administered 2022-01-29: 0.5 mL via INTRAMUSCULAR
  Filled 2022-01-29: qty 0.5

## 2022-01-29 MED ORDER — OXYCODONE-ACETAMINOPHEN 5-325 MG PO TABS
1.0000 | ORAL_TABLET | Freq: Three times a day (TID) | ORAL | 0 refills | Status: AC | PRN
Start: 2022-01-29 — End: 2022-01-30

## 2022-01-29 NOTE — ED Notes (Signed)
MSE not working. Verbal consent from pt.  ?

## 2022-01-29 NOTE — Discharge Instructions (Signed)
You have an abscess that was drained, recommend applying warm compresses to the area 2 times daily and rinse out the wound.  Please change out the dressings.  You may use over-the-counter pain medication as needed.  If after 48 hours your symptoms are getting worsen i.e. worsening redness pain swelling or becomes larger or more to come back in for reevaluation. ? ?I have given you a short course of narcotics please take as prescribed.  This medication can make you drowsy do not consume alcohol or operate heavy machinery when taking this medication.  This medication is Tylenol in it do not take Tylenol and take this medication.  ? ?Come back to the emergency department if you develop chest pain, shortness of breath, severe abdominal pain, uncontrolled nausea, vomiting, diarrhea. ? ? ? ?

## 2022-01-29 NOTE — ED Triage Notes (Signed)
Pt c/o abscess on top of buttocks since Sunday. No fever or drainage noted.  ?

## 2022-01-29 NOTE — ED Provider Notes (Signed)
?[facility] ?Provider Note ? ? ?CSN: [id number] ?Arrival date & time: 01/29/22  0909 ? ?  ? ?History ? ?Chief Complaint  ?Patient presents with  ? Abscess  ? ? ?Ricardo Burch is a 61 y.o. male. ? ?HPI ? ?Patient with medical history including COPD, CHF, edema presents to the emergency department complaints of an abscess on his left buttocks.  Patient is he noted the abscess on Saturday, states it came on suddenly, and over the last couple days has gotten larger more painful.  He denies any drainage or discharge to the area, denies any difficulty with bowel movements no hematochezia or melena, he has systemic symptoms like fevers or chills.  He states that he has had these in the past and generally have to be lanced on their own.  He is not immunocompromise, he is not up-to-date on his tetanus shot. ? ?Home Medications ?Prior to Admission medications   ?Medication Sig Start Date End Date Taking? Authorizing Provider  ?albuterol (VENTOLIN HFA) 108 (90 Base) MCG/ACT inhaler INHALE 1 OR 2 PUFFS BY MOUTH EVERY 6 HOURS AS NEEDED FOR WHEEZING OR SHORTNESS OF BREATH 01/11/22  Yes Mannam, Praveen, MD  ?amLODipine (NORVASC) 5 MG tablet Take 1 tablet (5 mg total) by mouth daily. 11/28/21  Yes [name], MD  ?Budeson-Glycopyrrol-Formoterol (BREZTRI AEROSPHERE) 160-9-4.8 MCG/ACT AERO INHALE 2 PUFFS BY MOUTH TWICE DAILY 01/11/22  Yes Mannam, Praveen, MD  ?DALIRESP 500 MCG TABS tablet TAKE 1 TABLET BY MOUTH DAILY 09/25/21  Yes Mannam, Praveen, MD  ?furosemide (LASIX) 20 MG tablet Take 20 mg by mouth daily. 12/25/21  Yes [provider]  ?oxyCODONE-acetaminophen (PERCOCET/ROXICET) 5-325 MG tablet Take 1 tablet by mouth every 8 (eight) hours as needed for up to 1 day for severe pain. 01/29/22 01/30/22 Yes Marcello Fennel, PA-C  ?OXYGEN Inhale 3 L into the lungs daily. continuous   Yes [provider]  ?sertraline (ZOLOFT) 25 MG tablet Take 1 tablet (25 mg total) by mouth daily. 11/28/21  Yes  [name], MD  ?doxycycline (VIBRAMYCIN) 100 MG capsule Take 1 capsule (100 mg total) by mouth 2 (two) times daily. ?Patient not taking: Reported on 01/29/2022 12/05/21   Jaynee Eagles, PA-C  ?fluticasone (FLONASE) 50 MCG/ACT nasal spray INSTILL 2 SPRAYS IN BOTH NOSTRILS TWICE DAILY ?Patient not taking: Reported on 01/29/2022 01/07/22   Marshell Garfinkel, MD  ?lidocaine (LIDODERM) 5 % Place 1 patch onto the skin daily. Remove & Discard patch within 12 hours or as directed by MD ?Patient not taking: Reported on 11/28/2021 09/28/21   Maudie Flakes, MD  ?methocarbamol (ROBAXIN) 500 MG tablet Take 1 tablet (500 mg total) by mouth every 8 (eight) hours as needed for muscle spasms. ?Patient not taking: Reported on 01/29/2022 09/28/21   Maudie Flakes, MD  ?   ? ?Allergies    ?Apresoline [hydralazine]   ? ?Review of Systems   ?Review of Systems  ?Constitutional:  Negative for chills and fever.  ?Respiratory:  Negative for shortness of breath.   ?Cardiovascular:  Negative for chest pain.  ?Gastrointestinal:  Negative for abdominal pain.  ?Skin:  Positive for wound.  ?Neurological:  Negative for headaches.  ? ?Physical Exam ?Updated Vital Signs ?BP (!) 131/96   Pulse 92   Temp 98.2 ?F (36.8 ?C) (Oral)   Resp 19   Ht '5\' 8"'$  (1.727 m)   Wt 119.1 kg   SpO2 93%   BMI 39.92 kg/m?  ?Physical Exam ?Vitals and nursing  note reviewed. Exam conducted with a chaperone present.  ?Constitutional:   ?   General: He is not in acute distress. ?   Appearance: Normal appearance. He is not ill-appearing or diaphoretic.  ?HENT:  ?   Head: Normocephalic and atraumatic.  ?   Nose: No congestion or rhinorrhea.  ?Eyes:  ?   General: No scleral icterus.    ?   Right eye: No discharge.     ?   Left eye: No discharge.  ?   Conjunctiva/sclera: Conjunctivae normal.  ?Cardiovascular:  ?   Rate and Rhythm: Normal rate and regular rhythm.  ?Pulmonary:  ?   Effort: Pulmonary effort is normal.  ?Genitourinary: ?   Comments: With chaperone present buttocks  was visualized she has a noted [place] pustule on the left gluteus laterally to the gluteal cleft, measures about the size of a marble, slight overlying erythema, no drainage or discharge present.  There is fluctuance noted no induration.  ?Musculoskeletal:  ?   Cervical back: Neck supple.  ?Skin: ?   General: Skin is warm and dry.  ?   Coloration: Skin is not jaundiced or pale.  ?Neurological:  ?   Mental Status: He is alert and oriented to person, place, and time.  ?Psychiatric:     ?   Mood and Affect: Mood normal.  ? ? ?ED Results / Procedures / Treatments   ?Labs ?(all labs ordered are listed, but only abnormal results are displayed) ?Labs Reviewed - No data to display ? ?EKG ?None ? ?Radiology ?No results found. ? ?Procedures ?[name].Incision and Drainage ? ?Date/Time: 01/29/2022 10:59 AM ?Performed by: Marcello Fennel, PA-C ?Authorized by: Marcello Fennel, PA-C  ? ?Consent:  ?  Consent obtained:  Verbal ?  Consent given by:  Patient ?  Risks discussed:  Bleeding, incomplete drainage, pain, damage to other organs and infection ?  Alternatives discussed:  No treatment, delayed treatment, alternative treatment, observation and referral ?Location:  ?  Type:  Abscess ?  Size:  2cm ?  Location:  Lower extremity ?  Lower extremity location:  Buttock ?  Buttock location:  L buttock ?Pre-procedure details:  ?  Skin preparation:  Povidone-iodine ?Sedation:  ?  Sedation type:  None ?Anesthesia:  ?  Anesthesia method:  Local infiltration ?  Local anesthetic:  Lidocaine 2% WITH epi ?Procedure type:  ?  Complexity:  Simple ?Procedure details:  ?  Ultrasound guidance: no   ?  Needle aspiration: no   ?  Incision types:  Single straight ?  Incision depth:  Subcutaneous ?  Wound management:  Probed and deloculated ?  Drainage:  Bloody and purulent ?  Drainage amount:  Moderate ?  Wound treatment:  Wound left open ?  Packing materials:  None ?Post-procedure details:  ?  Procedure completion:  Tolerated well, no immediate  complications  ? ? ?Medications Ordered in ED ?Medications  ?fentaNYL (SUBLIMAZE) injection 50 mcg (50 mcg Intramuscular Given 01/29/22 1035)  ?lidocaine-EPINEPHrine (XYLOCAINE W/EPI) 2 %-1:200000 (PF) injection 10 mL (10 mLs Infiltration Given 01/29/22 1035)  ?Tdap (BOOSTRIX) injection 0.5 mL (0.5 mLs Intramuscular Given 01/29/22 1037)  ? ? ?ED Course/ Medical Decision Making/ A&P ?  ?                        ?Medical Decision Making ?Risk ?Prescription drug management. ? ? ?This patient presents to the ED for concern of abscess, this involves an extensive number of treatment options,  and is a complaint that carries with it a high risk of complications and morbidity.  The differential diagnosis includes necrotizing fasciitis, cellulitis, fistula ? ? ? ?Additional history obtained: ? ?Additional history obtained from N/A ?External records from outside source obtained and reviewed including N/A ? ? ?Co morbidities that complicate the patient evaluation ? ?CHF, COPD ? ?Social Determinants of Health: ? ?N/A ? ? ? ?Lab Tests: ? ?I Ordered, and personally interpreted labs.  The pertinent results include: N/A ? ? ?Imaging Studies ordered: ? ?I ordered imaging studies including N/A ?I independently visualized and interpreted imaging which showed N/A ?I agree with the radiologist interpretation ? ? ?Cardiac Monitoring: ? ?The patient was maintained on a cardiac monitor.  I personally viewed and interpreted the cardiac monitored which showed an underlying rhythm of: N/A ? ? ?Medicines ordered and prescription drug management: ? ?I ordered medication including fentanyl and lidocaine for pain control. ?I have reviewed the patients home medicines and have made adjustments as needed ? ?Critical Interventions: ? ?N/A ? ? ?Reevaluation: ? ?Presents with a abscess, on exam as well as size of a marble, fluctuance is noted, will recommend I&D for improved wound healing.  Patient agreed this plan will provide him with fentanyl and then  proceed with procedure. ? ?Patient with skin abscess  located left buttocks, amenable to incision and drainage.  Patient tolerated procedure well.  Abscess was not large enough to warrant packing or drain. ? ?Consultat

## 2022-02-02 DIAGNOSIS — G4733 Obstructive sleep apnea (adult) (pediatric): Secondary | ICD-10-CM | POA: Diagnosis not present

## 2022-02-02 DIAGNOSIS — J9690 Respiratory failure, unspecified, unspecified whether with hypoxia or hypercapnia: Secondary | ICD-10-CM | POA: Diagnosis not present

## 2022-02-02 DIAGNOSIS — I272 Pulmonary hypertension, unspecified: Secondary | ICD-10-CM | POA: Diagnosis not present

## 2022-02-02 DIAGNOSIS — J449 Chronic obstructive pulmonary disease, unspecified: Secondary | ICD-10-CM | POA: Diagnosis not present

## 2022-02-03 DIAGNOSIS — J449 Chronic obstructive pulmonary disease, unspecified: Secondary | ICD-10-CM | POA: Diagnosis not present

## 2022-02-04 DIAGNOSIS — L0501 Pilonidal cyst with abscess: Secondary | ICD-10-CM | POA: Diagnosis not present

## 2022-02-08 ENCOUNTER — Telehealth: Payer: Medicare Other

## 2022-02-11 ENCOUNTER — Ambulatory Visit (INDEPENDENT_AMBULATORY_CARE_PROVIDER_SITE_OTHER): Payer: Medicare Other | Admitting: Licensed Clinical Social Worker

## 2022-02-11 DIAGNOSIS — G4733 Obstructive sleep apnea (adult) (pediatric): Secondary | ICD-10-CM

## 2022-02-11 DIAGNOSIS — I5032 Chronic diastolic (congestive) heart failure: Secondary | ICD-10-CM

## 2022-02-11 DIAGNOSIS — F331 Major depressive disorder, recurrent, moderate: Secondary | ICD-10-CM

## 2022-02-11 DIAGNOSIS — I10 Essential (primary) hypertension: Secondary | ICD-10-CM

## 2022-02-11 NOTE — Patient Instructions (Addendum)
Visit Information ? ?Patient Goals: Depressive symptoms identified. Manage Depression issues ? ?Time Frame:  Short Term Goal ?Progress: On Track ?Priority:  Medium ? ?Start Date:  12/28/21 ?End Date:  05/06/22   ? ?Follow Up Date:  03/14/22 at 2:00 PM  ? ?Depressive symptoms identified.  Manage Depression issues ? ?Patient Self Care Activities:  ?Takes medications as prescribed ?Attends scheduled medical appointments ? ?Patient Coping Strengths:  ?Support from [place], Thedore Mins ? ?Patient Self Care Deficits:  ?Short of breath on exertion ?Depression challenges ? ?Patient Goals:  ?- spend time or talk with others at least 2 to 3 times per week ?- practice relaxation or meditation daily ?- keep a calendar with appointment dates ? ?Follow Up Plan: LCSW to call client on 03/14/22 at 2:00 PM  ? ?Norva Riffle.Dyasia Firestine MSW, LCSW ?Licensed Clinical Social Worker ?[facility] ?[phone] ?

## 2022-02-11 NOTE — Chronic Care Management (AMB) (Signed)
?Chronic Care Management  ? ? Clinical Social Work Note ? ?02/11/2022 ?Name: Ricardo Burch MRN: [id number] DOB: Apr 15, 1961 ? ?Ricardo Burch is a 61 y.o. year old male who is a primary care patient of [name], MD. The CCM team was consulted to assist the patient with chronic disease management and/or care coordination needs related to: [employer] .  ? ?Engaged with patient by telephone for follow up visit in response to provider referral for social work chronic care management and care coordination services.  ? ?Consent to Services:  ?The patient was given information about Chronic Care Management services, agreed to services, and gave verbal consent prior to initiation of services.  Please see initial visit note for detailed documentation.  ? ?Patient agreed to services and consent obtained.  ? ?Assessment: Review of patient past medical history, allergies, medications, and health status, including review of relevant consultants reports was performed today as part of a comprehensive evaluation and provision of chronic care management and care coordination services.    ? ?SDOH (Social Determinants of Health) assessments and interventions performed:  ?SDOH Interventions   ? ?Flowsheet Row Most Recent Value  ?SDOH Interventions   ?Physical Activity Interventions Other (Comments)  [walking challenges. he gets short of breath occasionally when walking]  ?Stress Interventions Provide Counseling  [client has some stress related to managing medical needs]  ?Depression Interventions/Treatment  Medication, Counseling  ? ?  ?  ? ?Advanced Directives Status: See [employer]yn[employer] application for related entries. ? ?CCM Care Plan ? ?Allergies  ?Allergen Reactions  ? Apresoline [Hydralazine] Other (See Comments)  ?  Headache   ? ? ?Outpatient Encounter Medications as of 02/11/2022  ?Medication Sig  ? albuterol (VENTOLIN HFA) 108 (90 Base) MCG/ACT inhaler INHALE 1 OR 2 PUFFS BY MOUTH EVERY 6 HOURS AS NEEDED FOR WHEEZING OR SHORTNESS  OF BREATH  ? amLODipine (NORVASC) 5 MG tablet Take 1 tablet (5 mg total) by mouth daily.  ? Budeson-Glycopyrrol-Formoterol (BREZTRI AEROSPHERE) 160-9-4.8 MCG/ACT AERO INHALE 2 PUFFS BY MOUTH TWICE DAILY  ? DALIRESP 500 MCG TABS tablet TAKE 1 TABLET BY MOUTH DAILY  ? doxycycline (VIBRAMYCIN) 100 MG capsule Take 1 capsule (100 mg total) by mouth 2 (two) times daily. (Patient not taking: Reported on 01/29/2022)  ? fluticasone (FLONASE) 50 MCG/ACT nasal spray INSTILL 2 SPRAYS IN BOTH NOSTRILS TWICE DAILY (Patient not taking: Reported on 01/29/2022)  ? furosemide (LASIX) 20 MG tablet Take 20 mg by mouth daily.  ? lidocaine (LIDODERM) 5 % Place 1 patch onto the skin daily. Remove & Discard patch within 12 hours or as directed by MD (Patient not taking: Reported on 11/28/2021)  ? methocarbamol (ROBAXIN) 500 MG tablet Take 1 tablet (500 mg total) by mouth every 8 (eight) hours as needed for muscle spasms. (Patient not taking: Reported on 01/29/2022)  ? OXYGEN Inhale 3 L into the lungs daily. continuous  ? sertraline (ZOLOFT) 25 MG tablet Take 1 tablet (25 mg total) by mouth daily.  ? ?No facility-administered encounter medications on file as of 02/11/2022.  ? ? ?Patient Active Problem List  ? Diagnosis Date Noted  ? Chronic fatigue 11/28/2021  ? Chronic low back pain without sciatica 11/28/2021  ? Lung nodule 08/30/2021  ? Pulmonary hypertension, moderate to severe ([place]) 08/28/2021  ? GERD (gastroesophageal reflux disease) 01/25/2020  ? Bloating 01/25/2020  ? Morbid obesity ([place]) 12/06/2019  ? Erectile dysfunction 08/18/2019  ? Chronic diastolic CHF (congestive heart failure) ([place]) 09/23/2018  ? Hyperlipidemia 01/12/2018  ? Hypertension 10/14/2017  ?  Aortic atherosclerosis ([place]) 09/15/2017  ? Pre-diabetes 09/15/2017  ? Cocaine abuse ([place]) 07/26/2016  ? Depression 07/26/2016  ? OSA on CPAP 07/26/2016  ? Alcohol abuse 06/27/2016  ? COPD (chronic obstructive pulmonary disease) ([place]) 08/23/2014  ? Eczema 08/23/2014  ? Right knee  pain 08/23/2014  ? Tobacco abuse 03/27/2014  ? Chronic respiratory failure with hypoxia ([place]) 03/27/2014  ? ? ?Conditions to be addressed/monitored: monitor client management of depression issues ? ?Care Plan : LCSW Care Plan  ?Updates made by Katha Cabal, LCSW since 02/11/2022 12:00 AM  ?  ? ?Problem: Emotional Distress   ?  ? ?Goal: Emotional Health Supported Manage Depression issues   ?Start Date: 12/28/2021  ?Expected End Date: 05/23/2022  ?This Visit's Progress: On track  ?Recent Progress: On track  ?Priority: Medium  ?Note:   ?Current Barriers:  ?Depression issues ?Financial challenges ?Shortness of breath on exertion ?Suicidal Ideation/Homicidal Ideation: No ? ?Clinical Social Work Goal(s):  ?patient will work with SW monthly by telephone or in person to reduce or manage symptoms related to depression and depression issues ?Patient will attend scheduled medical appointments in next 30 days ?Patient will use self care techniques in next 30 days to manage stress (eat meals at routine times, get adequate rest, go fishing when able, play computer games, take a walk) ?Encouraged client to call RNCM as needed in next 30 days for nursing support ? ?Interventions: ?1:1 collaboration with [name], MD regarding development and update of comprehensive plan of care as evidenced by provider attestation and co-signature ?Discussed medication procurement of client ?Reviewed transport needs of client. Informed client of [facility] transport services. LCSW gave client the phone number for [facility] for transport assistance.  Talked also with client about [facility]. Discussed client use of Medicaid for transport assistance . ?Discussed client shortness of breath on exertion. Client has in home oxygen equipment and client has portable oxygen equipment ?Discussed sleeping issues. Client  uses C-PAP at night to help him with breathing ?Discussed support with fiance, Thedore Mins ?Discussed ambulation of client .He walks  without using a device. He does use oxygen when walking ?Discussed mood of client. He takes Zoloft as prescribed. He said he thought his mood was stable. Talked with Jousha about relaxation techniques (going fishing, watch TV, play computer games, go for walk) ?Encouraged Milbert Coulter to talk with [facility] about nursing needs of client ?Provided counseling support for [employer] ? ?Patient Self Care Activities:  ?Takes medications as prescribed ?Attends scheduled medical appointments ? ?Patient Coping Strengths:  ?Support from [place], Thedore Mins ? ?Patient Self Care Deficits:  ?Short of breath on exertion ?Depression challenges ? ?Patient Goals:  ?- spend time or talk with others at least 2 to 3 times per week ?- practice relaxation or meditation daily ?- keep a calendar with appointment dates ? ?Follow Up Plan: LCSW to call client on 03/14/22 at 2:00 PM   ?  ?Norva Riffle.Sandar Krinke MSW, LCSW ?Licensed Clinical Social Worker ?[facility] ?[phone] ?

## 2022-02-20 DIAGNOSIS — L728 Other follicular cysts of the skin and subcutaneous tissue: Secondary | ICD-10-CM | POA: Diagnosis not present

## 2022-02-20 DIAGNOSIS — L0591 Pilonidal cyst without abscess: Secondary | ICD-10-CM | POA: Diagnosis not present

## 2022-02-25 DIAGNOSIS — J9611 Chronic respiratory failure with hypoxia: Secondary | ICD-10-CM | POA: Diagnosis not present

## 2022-02-25 DIAGNOSIS — Z79899 Other long term (current) drug therapy: Secondary | ICD-10-CM | POA: Diagnosis not present

## 2022-02-25 DIAGNOSIS — I5032 Chronic diastolic (congestive) heart failure: Secondary | ICD-10-CM | POA: Diagnosis not present

## 2022-02-25 DIAGNOSIS — I700 Atherosclerosis of aorta: Secondary | ICD-10-CM | POA: Diagnosis not present

## 2022-03-03 DIAGNOSIS — I5032 Chronic diastolic (congestive) heart failure: Secondary | ICD-10-CM

## 2022-03-03 DIAGNOSIS — I10 Essential (primary) hypertension: Secondary | ICD-10-CM

## 2022-03-03 DIAGNOSIS — F331 Major depressive disorder, recurrent, moderate: Secondary | ICD-10-CM

## 2022-03-04 ENCOUNTER — Ambulatory Visit (INDEPENDENT_AMBULATORY_CARE_PROVIDER_SITE_OTHER): Payer: Medicare Other

## 2022-03-04 ENCOUNTER — Ambulatory Visit (INDEPENDENT_AMBULATORY_CARE_PROVIDER_SITE_OTHER): Payer: Medicare Other | Admitting: Orthopedic Surgery

## 2022-03-04 DIAGNOSIS — M2341 Loose body in knee, right knee: Secondary | ICD-10-CM

## 2022-03-04 DIAGNOSIS — G8929 Other chronic pain: Secondary | ICD-10-CM

## 2022-03-04 DIAGNOSIS — M1711 Unilateral primary osteoarthritis, right knee: Secondary | ICD-10-CM

## 2022-03-04 NOTE — Patient Instructions (Signed)
Follow up as needed

## 2022-03-04 NOTE — Progress Notes (Signed)
Chief Complaint  ?Patient presents with  ? Knee Pain  ?  RT/ follow up/ improved  ? ?61 year old male had a recurrent effusions right knee doing well now no issues no catching locking or giving way ? ?Exam focused full range of motion mild varus alignment ? ?X-ray negative for any advanced arthritis he has grade 2 disease with a stable loose body central to anterocentral portion of the joint ? ?He can follow-up as needed ?

## 2022-03-05 ENCOUNTER — Ambulatory Visit (INDEPENDENT_AMBULATORY_CARE_PROVIDER_SITE_OTHER): Payer: Medicare Other | Admitting: Internal Medicine

## 2022-03-05 ENCOUNTER — Encounter: Payer: Self-pay | Admitting: Internal Medicine

## 2022-03-05 VITALS — BP 132/86 | HR 82 | Resp 18 | Ht 68.0 in | Wt 259.4 lb

## 2022-03-05 DIAGNOSIS — R7303 Prediabetes: Secondary | ICD-10-CM | POA: Diagnosis not present

## 2022-03-05 DIAGNOSIS — F331 Major depressive disorder, recurrent, moderate: Secondary | ICD-10-CM

## 2022-03-05 DIAGNOSIS — J432 Centrilobular emphysema: Secondary | ICD-10-CM

## 2022-03-05 DIAGNOSIS — J9611 Chronic respiratory failure with hypoxia: Secondary | ICD-10-CM

## 2022-03-05 DIAGNOSIS — Z0001 Encounter for general adult medical examination with abnormal findings: Secondary | ICD-10-CM | POA: Diagnosis not present

## 2022-03-05 DIAGNOSIS — J449 Chronic obstructive pulmonary disease, unspecified: Secondary | ICD-10-CM | POA: Diagnosis not present

## 2022-03-05 DIAGNOSIS — E559 Vitamin D deficiency, unspecified: Secondary | ICD-10-CM

## 2022-03-05 DIAGNOSIS — F3341 Major depressive disorder, recurrent, in partial remission: Secondary | ICD-10-CM

## 2022-03-05 MED ORDER — SERTRALINE HCL 25 MG PO TABS: 25.0000 mg | ORAL_TABLET | Freq: Every day | ORAL | 1 refills | Status: DC

## 2022-03-05 NOTE — Assessment & Plan Note (Signed)
Uses continuous home O2 at 3 LPM Due to COPD 

## 2022-03-05 NOTE — Assessment & Plan Note (Addendum)
[facility] Office Visit from 03/05/2022 in [facility]  ?PHQ-9 Total Score 0  ?  ? ?Likely due to chronic medical conditions ?Referred to [facility] therapy ?Had started Zoloft 25 mg daily for now, needs to take it regularly ?

## 2022-03-05 NOTE — Patient Instructions (Addendum)
Please continue taking medications as prescribed. ? ?Please continue to follow low carb and low salt diet and ambulate as tolerated. ? ?Please get fasting blood tests done before the next visit. ?

## 2022-03-05 NOTE — Assessment & Plan Note (Signed)
HbA1C: 5.8 On Ozempic, for weight loss, started by Cardiology at [facility] 

## 2022-03-05 NOTE — Assessment & Plan Note (Signed)

## 2022-03-05 NOTE — Progress Notes (Signed)
? ?Established Patient Office Visit ? ?Subjective:  ?Patient ID: Ricardo Burch, male    DOB: 10/09/1961  Age: 61 y.o. MRN: [id number] ? ?CC:  ?Chief Complaint  ?Patient presents with  ? Annual Exam  ?  Annual exam   ? ? ?HPI ?Ricardo Burch is a 61 y.o. male with past medical history of HTN, COPD, chronic hypoxic respiratory failure, OSA, GERD and chronic fatigue who presents for annual physical. ? ?HTN: BP is well-controlled. Takes medications regularly. Patient denies headache, dizziness, chest pain, or palpitations. ? ?Chronic hypoxic respiratory failure and COPD: He uses Breztri and as needed albuterol for COPD and follows up with Dr. Vaughan Browner.  He is being evaluated for lung reduction surgery.  Recently, his echo showed pulmonary HTN, for which he was referred to pulmonology HTN clinic at [facility]. He currently uses home O2 at 3 LPM.  He has chronic dyspnea.  He was recently placed on Farxiga for HFpEF and Ozempic for weight loss benefit by cardiology clinic at [facility]. ? ?Depression: He has started taking Zoloft, but admits that he had not been taking it regularly.  Denies any SI or HI currently. ? ? ? ?Past Medical History:  ?Diagnosis Date  ? Allergy   ? CHF (congestive heart failure) ([place])   ? Chronic kidney disease   ? COPD (chronic obstructive pulmonary disease) ([place]) Dx 2015  ? Depression   ? Eczema   ? since childhood   ? Erectile dysfunction 08/18/2019  ? GERD (gastroesophageal reflux disease) 01/25/2020  ? Hyperlipidemia 01/12/2018  ? Hypertension   ? Oxygen deficiency   ? Screening for HIV (human immunodeficiency virus)   ? Sleep apnea   ? CPAP  ? Substance abuse ([place])   ? MJ, cocaine  ? Testicular failure 07/22/2019  ? ? ?Past Surgical History:  ?Procedure Laterality Date  ? COLONOSCOPY WITH PROPOFOL N/A 06/25/2017  ? Procedure: COLONOSCOPY WITH PROPOFOL;  Surgeon: Daneil Dolin, MD;  [facility];  Service: Endoscopy;  Laterality: N/A;  2:00pm  ? MULTIPLE EXTRACTIONS WITH ALVEOLOPLASTY N/A  03/22/2016  ? Procedure: MULTIPLE EXTRACTION WITH ALVEOLOPLASTY;  Surgeon: Diona Browner, DDS;  Location: [place];  Service: Oral Surgery;  Laterality: N/A;  ? NO PAST SURGERIES    ? POLYPECTOMY  06/25/2017  ? Procedure: POLYPECTOMY;  Surgeon: Daneil Dolin, MD;  [facility];  Service: Endoscopy;;  colon  ? ? ?Family History  ?Problem Relation Age of Onset  ? Arthritis Mother   ? Hypertension Mother   ? Stroke Mother   ?     age 3  ? Cerebral aneurysm Father   ? Alcohol abuse Father   ? Cancer Brother   ? Diabetes Neg Hx   ? Heart disease Neg Hx   ? ? ?Social History  ? ?Socioeconomic History  ? Marital status: Single  ?  Spouse name: Not on file  ? Number of children: 5  ? Years of education: 13  ? Highest education level: Not on file  ?Occupational History  ? Occupation: Unemployed   ?  Comment: car wash details  ?Tobacco Use  ? Smoking status: Former  ?  Packs/day: 1.00  ?  Years: 38.00  ?  Pack years: 38.00  ?  Types: Cigarettes  ?  Quit date: 02/21/2020  ?  Years since quitting: 2.0  ? Smokeless tobacco: Never  ?Vaping Use  ? Vaping Use: Never used  ?Substance and Sexual Activity  ? Alcohol use: Yes  ?  Alcohol/week: 1.0 standard drink  ?  Types: 1 Cans of beer per week  ?  Comment: Socially x 1/month currently (as of 01/25/20); previously: occassionally: weekends, 6-pack or less on a day; or half pint of liquior  ? Drug use: Yes  ?  Types: Marijuana, Cocaine  ?  Comment: 01/25/20-marijuana few weeks ago, no cocaine  ? Sexual activity: Never  ?Other Topics Concern  ? Not on file  ?Social History Narrative  ?  Lives alone.   ? 5 daughters 74, 49 yo twins, eldest 2 are married live in [place]. Retired.Girlfriend works in Armed forces logistics/support/administrative officer.  ? ?Social Determinants of Health  ? ?Financial Resource Strain: Low Risk   ? Difficulty of Paying Living Expenses: Not very hard  ?Food Insecurity: No Food Insecurity  ? Worried About Charity fundraiser in the Last Year: Never true  ? Ran Out of Food in the Last Year:  Never true  ?Transportation Needs: No Transportation Needs  ? Lack of Transportation (Medical): No  ? Lack of Transportation (Non-Medical): No  ?Physical Activity: Insufficiently Active  ? Days of Exercise per Week: 1 day  ? Minutes of Exercise per Session: 10 min  ?Stress: Stress Concern Present  ? Feeling of Stress : To some extent  ?Social Connections: Moderately Integrated  ? Frequency of Communication with Friends and Family: More than three times a week  ? Frequency of Social Gatherings with Friends and Family: Three times a week  ? Attends Religious Services: More than 4 times per year  ? Active Member of Clubs or Organizations: No  ? Attends Archivist Meetings: Never  ? Marital Status: Living with partner  ?Intimate Partner Violence: Not At Risk  ? Fear of Current or Ex-Partner: No  ? Emotionally Abused: No  ? Physically Abused: No  ? Sexually Abused: No  ? ? ?Outpatient Medications Prior to Visit  ?Medication Sig Dispense Refill  ? albuterol (VENTOLIN HFA) 108 (90 Base) MCG/ACT inhaler INHALE 1 OR 2 PUFFS BY MOUTH EVERY 6 HOURS AS NEEDED FOR WHEEZING OR SHORTNESS OF BREATH 8.5 each 5  ? amLODipine (NORVASC) 5 MG tablet Take 1 tablet (5 mg total) by mouth daily. 90 tablet 1  ? Budeson-Glycopyrrol-Formoterol (BREZTRI AEROSPHERE) 160-9-4.8 MCG/ACT AERO INHALE 2 PUFFS BY MOUTH TWICE DAILY 10.5 g 5  ? DALIRESP 500 MCG TABS tablet TAKE 1 TABLET BY MOUTH DAILY 30 tablet 5  ? FARXIGA 10 MG TABS tablet Take 10 mg by mouth daily.    ? fluticasone (FLONASE) 50 MCG/ACT nasal spray INSTILL 2 SPRAYS IN BOTH NOSTRILS TWICE DAILY 16 mL 5  ? furosemide (LASIX) 20 MG tablet Take 20 mg by mouth daily.    ? lidocaine (LIDODERM) 5 % Place 1 patch onto the skin daily. Remove & Discard patch within 12 hours or as directed by MD 5 patch 0  ? methocarbamol (ROBAXIN) 500 MG tablet Take 1 tablet (500 mg total) by mouth every 8 (eight) hours as needed for muscle spasms. 30 tablet 0  ? OXYGEN Inhale 3 L into the lungs daily.  continuous    ? OZEMPIC, 0.25 OR 0.5 MG/DOSE, 2 MG/3ML SOPN SMARTSIG:0.25 Milligram(s) SUB-Q Once a Week    ? sertraline (ZOLOFT) 25 MG tablet Take 1 tablet (25 mg total) by mouth daily. 30 tablet 2  ? doxycycline (VIBRAMYCIN) 100 MG capsule Take 1 capsule (100 mg total) by mouth 2 (two) times daily. (Patient not taking: Reported on 03/05/2022) 20 capsule 0  ? ?  No facility-administered medications prior to visit.  ? ? ?Allergies  ?Allergen Reactions  ? Apresoline [Hydralazine] Other (See Comments)  ?  Headache   ? ? ?ROS ?Review of Systems  ?Constitutional:  Positive for fatigue. Negative for chills and fever.  ?HENT:  Negative for congestion and sore throat.   ?Eyes:  Negative for pain and discharge.  ?Respiratory:  Positive for shortness of breath. Negative for cough.   ?Cardiovascular:  Negative for chest pain and palpitations.  ?Gastrointestinal:  Negative for diarrhea, nausea and vomiting.  ?Endocrine: Negative for polydipsia and polyuria.  ?Genitourinary:  Negative for dysuria and hematuria.  ?Musculoskeletal:  Positive for back pain. Negative for neck pain and neck stiffness.  ?Skin:  Negative for rash.  ?Neurological:  Negative for dizziness, weakness, numbness and headaches.  ?Psychiatric/Behavioral:  Positive for dysphoric mood. Negative for agitation and behavioral problems.   ? ?  ?Objective:  ?  ?Physical Exam ?Vitals reviewed.  ?Constitutional:   ?   General: He is not in acute distress. ?   Appearance: He is not diaphoretic.  ?HENT:  ?   Head: Normocephalic and atraumatic.  ?   Nose: Nose normal.  ?   Mouth/Throat:  ?   Mouth: Mucous membranes are moist.  ?Eyes:  ?   General: No scleral icterus. ?   Extraocular Movements: Extraocular movements intact.  ?Cardiovascular:  ?   Rate and Rhythm: Normal rate and regular rhythm.  ?   Pulses: Normal pulses.  ?   Heart sounds: Normal heart sounds. No murmur heard. ?Pulmonary:  ?   Breath sounds: Normal breath sounds. No wheezing or rales.  ?   Comments: On home  O2 - 3 lpm ?Abdominal:  ?   Palpations: Abdomen is soft.  ?   Tenderness: There is no abdominal tenderness.  ?Musculoskeletal:  ?   Cervical back: Neck supple. No tenderness.  ?   Right lower leg: No edema.  ?

## 2022-03-05 NOTE — Assessment & Plan Note (Signed)
On Breztri and as needed albuterol ?On Daliresp ?Followed by pulmonology- Dr. Vaughan Browner ?Was referred to [facility] for lung reduction surgery, undergoing eval currently ?

## 2022-03-05 NOTE — Assessment & Plan Note (Signed)
Diet modification and ambulation as tolerated ?On Ozempic now ?

## 2022-03-07 ENCOUNTER — Ambulatory Visit: Payer: Medicare Other | Admitting: Orthopedic Surgery

## 2022-03-13 DIAGNOSIS — G4733 Obstructive sleep apnea (adult) (pediatric): Secondary | ICD-10-CM | POA: Diagnosis not present

## 2022-03-13 DIAGNOSIS — L723 Sebaceous cyst: Secondary | ICD-10-CM | POA: Diagnosis not present

## 2022-03-13 DIAGNOSIS — Z9989 Dependence on other enabling machines and devices: Secondary | ICD-10-CM | POA: Diagnosis not present

## 2022-03-13 DIAGNOSIS — I272 Pulmonary hypertension, unspecified: Secondary | ICD-10-CM | POA: Diagnosis not present

## 2022-03-14 ENCOUNTER — Telehealth: Payer: Medicare Other | Admitting: Licensed Clinical Social Worker

## 2022-03-15 ENCOUNTER — Other Ambulatory Visit: Payer: Self-pay | Admitting: Radiology

## 2022-03-17 MED ORDER — NAPROXEN 500 MG PO TABS: 500.0000 mg | ORAL_TABLET | Freq: Two times a day (BID) | ORAL | 2 refills | Status: DC

## 2022-03-27 DIAGNOSIS — I272 Pulmonary hypertension, unspecified: Secondary | ICD-10-CM | POA: Diagnosis not present

## 2022-03-27 DIAGNOSIS — G4733 Obstructive sleep apnea (adult) (pediatric): Secondary | ICD-10-CM | POA: Diagnosis not present

## 2022-03-27 DIAGNOSIS — J9690 Respiratory failure, unspecified, unspecified whether with hypoxia or hypercapnia: Secondary | ICD-10-CM | POA: Diagnosis not present

## 2022-03-27 DIAGNOSIS — J449 Chronic obstructive pulmonary disease, unspecified: Secondary | ICD-10-CM | POA: Diagnosis not present

## 2022-03-28 ENCOUNTER — Encounter: Payer: Self-pay | Admitting: Internal Medicine

## 2022-03-29 DIAGNOSIS — J449 Chronic obstructive pulmonary disease, unspecified: Secondary | ICD-10-CM | POA: Diagnosis not present

## 2022-03-29 DIAGNOSIS — G4733 Obstructive sleep apnea (adult) (pediatric): Secondary | ICD-10-CM | POA: Diagnosis not present

## 2022-04-05 DIAGNOSIS — J449 Chronic obstructive pulmonary disease, unspecified: Secondary | ICD-10-CM | POA: Diagnosis not present

## 2022-04-27 DIAGNOSIS — J9690 Respiratory failure, unspecified, unspecified whether with hypoxia or hypercapnia: Secondary | ICD-10-CM | POA: Diagnosis not present

## 2022-04-27 DIAGNOSIS — I272 Pulmonary hypertension, unspecified: Secondary | ICD-10-CM | POA: Diagnosis not present

## 2022-04-27 DIAGNOSIS — J449 Chronic obstructive pulmonary disease, unspecified: Secondary | ICD-10-CM | POA: Diagnosis not present

## 2022-04-27 DIAGNOSIS — G4733 Obstructive sleep apnea (adult) (pediatric): Secondary | ICD-10-CM | POA: Diagnosis not present

## 2022-05-01 ENCOUNTER — Other Ambulatory Visit: Payer: Self-pay | Admitting: Internal Medicine

## 2022-05-01 DIAGNOSIS — I10 Essential (primary) hypertension: Secondary | ICD-10-CM

## 2022-05-02 ENCOUNTER — Other Ambulatory Visit: Payer: Self-pay | Admitting: *Deleted

## 2022-05-02 DIAGNOSIS — I10 Essential (primary) hypertension: Secondary | ICD-10-CM

## 2022-05-05 DIAGNOSIS — J449 Chronic obstructive pulmonary disease, unspecified: Secondary | ICD-10-CM | POA: Diagnosis not present

## 2022-05-16 DIAGNOSIS — Z0001 Encounter for general adult medical examination with abnormal findings: Secondary | ICD-10-CM | POA: Diagnosis not present

## 2022-05-16 DIAGNOSIS — E559 Vitamin D deficiency, unspecified: Secondary | ICD-10-CM | POA: Diagnosis not present

## 2022-05-16 DIAGNOSIS — R7303 Prediabetes: Secondary | ICD-10-CM | POA: Diagnosis not present

## 2022-05-17 LAB — CBC WITH DIFFERENTIAL/PLATELET
Basophils Absolute: 0.1 10*3/uL (ref 0.0–0.2)
Basos: 1 %
EOS (ABSOLUTE): 0.2 10*3/uL (ref 0.0–0.4)
Eos: 2 %
Hematocrit: 49.4 % (ref 37.5–51.0)
Hemoglobin: 16.4 g/dL (ref 13.0–17.7)
Immature Grans (Abs): 0 10*3/uL (ref 0.0–0.1)
Immature Granulocytes: 0 %
Lymphocytes Absolute: 2.8 10*3/uL (ref 0.7–3.1)
Lymphs: 32 %
MCH: 30.3 pg (ref 26.6–33.0)
MCHC: 33.2 g/dL (ref 31.5–35.7)
MCV: 91 fL (ref 79–97)
Monocytes Absolute: 0.9 10*3/uL (ref 0.1–0.9)
Monocytes: 11 %
Neutrophils Absolute: 4.8 10*3/uL (ref 1.4–7.0)
Neutrophils: 54 %
Platelets: 210 10*3/uL (ref 150–450)
RBC: 5.42 x10E6/uL (ref 4.14–5.80)
RDW: 13.8 % (ref 11.6–15.4)
WBC: 8.7 10*3/uL (ref 3.4–10.8)

## 2022-05-17 LAB — LIPID PANEL
Chol/HDL Ratio: 3.1 ratio (ref 0.0–5.0)
Cholesterol, Total: 156 mg/dL (ref 100–199)
HDL: 51 mg/dL (ref >39)
LDL Chol Calc (NIH): 94 mg/dL (ref 0–99)
Triglycerides: 52 mg/dL (ref 0–149)
VLDL Cholesterol Cal: 11 mg/dL (ref 5–40)

## 2022-05-17 LAB — CMP14+EGFR
ALT: 18 IU/L (ref 0–44)
AST: 20 IU/L (ref 0–40)
Albumin/Globulin Ratio: 1.3 (ref 1.2–2.2)
Albumin: 4.3 g/dL (ref 3.8–4.9)
Alkaline Phosphatase: 102 IU/L (ref 44–121)
BUN/Creatinine Ratio: 16 (ref 9–20)
BUN: 13 mg/dL (ref 6–24)
Bilirubin Total: 1.4 mg/dL — ABNORMAL HIGH (ref 0.0–1.2)
CO2: 28 mmol/L (ref 20–29)
Calcium: 9.2 mg/dL (ref 8.7–10.2)
Chloride: 96 mmol/L (ref 96–106)
Creatinine, Ser: 0.83 mg/dL (ref 0.76–1.27)
Globulin, Total: 3.2 g/dL (ref 1.5–4.5)
Glucose: 78 mg/dL (ref 70–99)
Potassium: 4.4 mmol/L (ref 3.5–5.2)
Sodium: 137 mmol/L (ref 134–144)
Total Protein: 7.5 g/dL (ref 6.0–8.5)
eGFR: 101 mL/min/{1.73_m2} (ref >59)

## 2022-05-17 LAB — TSH: TSH: 0.787 u[IU]/mL (ref 0.450–4.500)

## 2022-05-17 LAB — VITAMIN D 25 HYDROXY (VIT D DEFICIENCY, FRACTURES): Vit D, 25-Hydroxy: 22.3 ng/mL — ABNORMAL LOW (ref 30.0–100.0)

## 2022-05-17 LAB — HEMOGLOBIN A1C
Est. average glucose Bld gHb Est-mCnc: 117 mg/dL
Hgb A1c MFr Bld: 5.7 % — ABNORMAL HIGH (ref 4.8–5.6)

## 2022-05-20 ENCOUNTER — Encounter: Payer: Self-pay | Admitting: Internal Medicine

## 2022-05-22 ENCOUNTER — Telehealth: Payer: Self-pay | Admitting: Pulmonary Disease

## 2022-05-22 DIAGNOSIS — J438 Other emphysema: Secondary | ICD-10-CM

## 2022-05-22 MED ORDER — ROFLUMILAST 500 MCG PO TABS: 500.0000 ug | ORAL_TABLET | Freq: Every day | ORAL | 5 refills | Status: DC

## 2022-05-22 NOTE — Telephone Encounter (Signed)
Called patient back and verified pharmacy and medication that he was needing refilled. Nothing further needed

## 2022-05-22 NOTE — Telephone Encounter (Signed)
Called and left voicemail for patient to call office back  

## 2022-05-22 NOTE — Telephone Encounter (Signed)
Patient called pharmacy for a refill and they need a script sent. Pharmacy was [facility] but is now [facility] in service. Patient did schedule 6 month follow up 8/23.  Please advise, call patient back at [phone].

## 2022-05-27 DIAGNOSIS — G4733 Obstructive sleep apnea (adult) (pediatric): Secondary | ICD-10-CM | POA: Diagnosis not present

## 2022-05-27 DIAGNOSIS — I272 Pulmonary hypertension, unspecified: Secondary | ICD-10-CM | POA: Diagnosis not present

## 2022-05-27 DIAGNOSIS — J449 Chronic obstructive pulmonary disease, unspecified: Secondary | ICD-10-CM | POA: Diagnosis not present

## 2022-05-27 DIAGNOSIS — J9690 Respiratory failure, unspecified, unspecified whether with hypoxia or hypercapnia: Secondary | ICD-10-CM | POA: Diagnosis not present

## 2022-05-28 ENCOUNTER — Ambulatory Visit (INDEPENDENT_AMBULATORY_CARE_PROVIDER_SITE_OTHER): Payer: Medicare Other | Admitting: Internal Medicine

## 2022-05-28 ENCOUNTER — Encounter: Payer: Self-pay | Admitting: Internal Medicine

## 2022-05-28 ENCOUNTER — Other Ambulatory Visit: Payer: Self-pay | Admitting: Orthopedic Surgery

## 2022-05-28 VITALS — BP 134/86 | HR 82 | Resp 18 | Ht 69.0 in | Wt 246.8 lb

## 2022-05-28 DIAGNOSIS — G8929 Other chronic pain: Secondary | ICD-10-CM

## 2022-05-28 DIAGNOSIS — M545 Low back pain, unspecified: Secondary | ICD-10-CM | POA: Diagnosis not present

## 2022-05-28 DIAGNOSIS — L304 Erythema intertrigo: Secondary | ICD-10-CM

## 2022-05-28 DIAGNOSIS — R109 Unspecified abdominal pain: Secondary | ICD-10-CM | POA: Diagnosis not present

## 2022-05-28 MED ORDER — METHOCARBAMOL 500 MG PO TABS: 500.0000 mg | ORAL_TABLET | Freq: Two times a day (BID) | ORAL | 1 refills | Status: DC | PRN

## 2022-05-28 MED ORDER — KETOCONAZOLE 2 % EX CREA: 1.0000 | TOPICAL_CREAM | Freq: Every day | CUTANEOUS | 0 refills | Status: DC

## 2022-05-28 NOTE — Patient Instructions (Signed)
Please apply Ketoconazole cream for groin rash if it reappears.  Please continue to maintain at least 64 ounces of fluid intake in a day.  Please avoid heavy lifting and frequent bending.

## 2022-05-28 NOTE — Assessment & Plan Note (Signed)
Chronic, intermittent, worse in the morning No red flags currently Advised to perform simple back stretching exercises Naproxen as needed Robaxin as needed for muscle spasms Back brace as needed

## 2022-05-28 NOTE — Progress Notes (Signed)
Acute Office Visit  Subjective:    Patient ID: Ricardo Burch, male    DOB: May 11, 1961, 61 y.o.   MRN: [id number]  Chief Complaint  Patient presents with   Groin Pain    Pt having pain in creases of legs and testicles and legs this started 05-22-22 also lower back pain this is better after he gets up and starts moving     HPI Patient is in today for c/o itching rash in his groin area, which has improved now.  He also reports mild discomfort in groin and scrotal area, but denies any swelling or bulging currently.  He was worried about UTI, but denies any dysuria, hematuria, fever, chills, nausea or vomiting currently.  Denies any urethral discharge currently.  He also reports chronic low back pain, worse with bending and heavy lifting.  Pain is sharp, chronic, constant, variable in intensity.  He has tried taking naproxen without much relief.  Denies any numbness or tingling of the LE.  Past Medical History:  Diagnosis Date   Allergy    CHF (congestive heart failure) ([facility])    Chronic kidney disease    COPD (chronic obstructive pulmonary disease) ([place]) Dx 2015   Depression    Eczema    since childhood    Erectile dysfunction 08/18/2019   GERD (gastroesophageal reflux disease) 01/25/2020   Hyperlipidemia 01/12/2018   Hypertension    Oxygen deficiency    Screening for HIV (human immunodeficiency virus)    Sleep apnea    CPAP   Substance abuse ([place])    MJ, cocaine   Testicular failure 07/22/2019    Past Surgical History:  Procedure Laterality Date   COLONOSCOPY WITH PROPOFOL N/A 06/25/2017   Procedure: COLONOSCOPY WITH PROPOFOL;  Surgeon: Daneil Dolin, MD;  [facility];  Service: Endoscopy;  Laterality: N/A;  2:00pm   MULTIPLE EXTRACTIONS WITH ALVEOLOPLASTY N/A 03/22/2016   Procedure: MULTIPLE EXTRACTION WITH ALVEOLOPLASTY;  Surgeon: Diona Browner, DDS;  Location: [place];  Service: Oral Surgery;  Laterality: N/A;   NO PAST SURGERIES     POLYPECTOMY  06/25/2017    Procedure: POLYPECTOMY;  Surgeon: Daneil Dolin, MD;  [facility];  Service: Endoscopy;;  colon    Family History  Problem Relation Age of Onset   Arthritis Mother    Hypertension Mother    Stroke Mother        age 46   Cerebral aneurysm Father    Alcohol abuse Father    Cancer Brother    Diabetes Neg Hx    Heart disease Neg Hx     Social History   Socioeconomic History   Marital status: Single    Spouse name: Not on file   Number of children: 5   Years of education: 12   Highest education level: Not on file  Occupational History   Occupation: Unemployed     Comment: car wash details  Tobacco Use   Smoking status: Former    Packs/day: 1.00    Years: 38.00    Total pack years: 38.00    Types: Cigarettes    Quit date: 02/21/2020    Years since quitting: 2.2   Smokeless tobacco: Never  Vaping Use   Vaping Use: Never used  Substance and Sexual Activity   Alcohol use: Yes    Alcohol/week: 1.0 standard drink of alcohol    Types: 1 Cans of beer per week    Comment: Socially x 1/month currently (as of 01/25/20); previously:  occassionally: weekends, 6-pack or less on a day; or half pint of liquior   Drug use: Yes    Types: Marijuana, Cocaine    Comment: 01/25/20-marijuana few weeks ago, no cocaine   Sexual activity: Never  Other Topics Concern   Not on file  Social History Narrative    Lives alone.    5 daughters 70, 19 yo twins, eldest 2 are married live in [place]. Retired.Girlfriend works in Armed forces logistics/support/administrative officer.   Social Determinants of Health   Financial Resource Strain: Low Risk  (12/31/2021)   Overall Financial Resource Strain (CARDIA)    Difficulty of Paying Living Expenses: Not very hard  Food Insecurity: No Food Insecurity (12/31/2021)   Hunger Vital Sign    Worried About Running Out of Food in the Last Year: Never true    Ran Out of Food in the Last Year: Never true  Transportation Needs: No Transportation Needs (12/31/2021)   PRAPARE -  Hydrologist (Medical): No    Lack of Transportation (Non-Medical): No  Physical Activity: Insufficiently Active (02/11/2022)   Exercise Vital Sign    Days of Exercise per Week: 1 day    Minutes of Exercise per Session: 10 min  Stress: Stress Concern Present (02/11/2022)   [place]    Feeling of Stress : To some extent  Social Connections: Moderately Integrated (12/31/2021)   Social Connection and Isolation Panel [NHANES]    Frequency of Communication with Friends and Family: More than three times a week    Frequency of Social Gatherings with Friends and Family: Three times a week    Attends Religious Services: More than 4 times per year    Active Member of Clubs or Organizations: No    Attends Archivist Meetings: Never    Marital Status: Living with partner  Intimate Partner Violence: Not At Risk (12/31/2021)   Humiliation, Afraid, Rape, and Kick questionnaire    Fear of Current or Ex-Partner: No    Emotionally Abused: No    Physically Abused: No    Sexually Abused: No    Outpatient Medications Prior to Visit  Medication Sig Dispense Refill   albuterol (VENTOLIN HFA) 108 (90 Base) MCG/ACT inhaler INHALE 1 OR 2 PUFFS BY MOUTH EVERY 6 HOURS AS NEEDED FOR WHEEZING OR SHORTNESS OF BREATH 8.5 each 5   amLODipine (NORVASC) 5 MG tablet TAKE 1 TABLET BY MOUTH DAILY 30 tablet 10   Budeson-Glycopyrrol-Formoterol (BREZTRI AEROSPHERE) 160-9-4.8 MCG/ACT AERO INHALE 2 PUFFS BY MOUTH TWICE DAILY 10.5 g 5   FARXIGA 10 MG TABS tablet Take 10 mg by mouth daily.     fluticasone (FLONASE) 50 MCG/ACT nasal spray INSTILL 2 SPRAYS IN BOTH NOSTRILS TWICE DAILY 16 mL 5   furosemide (LASIX) 20 MG tablet Take 20 mg by mouth daily.     lidocaine (LIDODERM) 5 % Place 1 patch onto the skin daily. Remove & Discard patch within 12 hours or as directed by MD 5 patch 0   naproxen (NAPROSYN) 500 MG tablet Take 1  tablet (500 mg total) by mouth 2 (two) times daily with a meal. 60 tablet 2   OXYGEN Inhale 3 L into the lungs daily. continuous     OZEMPIC, 0.25 OR 0.5 MG/DOSE, 2 MG/3ML SOPN SMARTSIG:0.25 Milligram(s) SUB-Q Once a Week     roflumilast (DALIRESP) 500 MCG TABS tablet Take 1 tablet (500 mcg total) by mouth daily. 30 tablet 5  sertraline (ZOLOFT) 25 MG tablet Take 1 tablet (25 mg total) by mouth daily. 90 tablet 1   methocarbamol (ROBAXIN) 500 MG tablet Take 1 tablet (500 mg total) by mouth every 8 (eight) hours as needed for muscle spasms. 30 tablet 0   No facility-administered medications prior to visit.    Allergies  Allergen Reactions   Apresoline [Hydralazine] Other (See Comments)    Headache     Review of Systems  Constitutional:  Positive for fatigue. Negative for chills and fever.  HENT:  Negative for congestion and sore throat.   Eyes:  Negative for pain and discharge.  Respiratory:  Positive for shortness of breath. Negative for cough.   Cardiovascular:  Negative for chest pain and palpitations.  Gastrointestinal:  Negative for diarrhea, nausea and vomiting.  Endocrine: Negative for polydipsia and polyuria.  Genitourinary:  Negative for dysuria and hematuria.  Musculoskeletal:  Positive for back pain. Negative for neck pain and neck stiffness.  Skin:  Positive for rash.  Neurological:  Negative for dizziness, weakness, numbness and headaches.  Psychiatric/Behavioral:  Positive for dysphoric mood. Negative for agitation and behavioral problems.        Objective:    Physical Exam Vitals reviewed.  Constitutional:      General: He is not in acute distress.    Appearance: He is not diaphoretic.  HENT:     Head: Normocephalic and atraumatic.     Nose: Nose normal.     Mouth/Throat:     Mouth: Mucous membranes are moist.  Eyes:     General: No scleral icterus.    Extraocular Movements: Extraocular movements intact.  Cardiovascular:     Rate and Rhythm: Normal rate  and regular rhythm.     Pulses: Normal pulses.     Heart sounds: Normal heart sounds. No murmur heard. Pulmonary:     Breath sounds: Normal breath sounds. No wheezing or rales.     Comments: On home O2 - 3 lpm Abdominal:     Palpations: Abdomen is soft. There is no mass.     Tenderness: There is no abdominal tenderness.     Hernia: No hernia is present.  Musculoskeletal:     Cervical back: Neck supple. No tenderness.     Right lower leg: No edema.     Left lower leg: No edema.  Skin:    General: Skin is warm.     Findings: Rash (Erythema over the groin area improved now) present.  Neurological:     General: No focal deficit present.     Mental Status: He is alert and oriented to person, place, and time.     Sensory: No sensory deficit.     Motor: No weakness.  Psychiatric:        Mood and Affect: Mood normal.        Behavior: Behavior normal.     BP 134/86 (BP Location: Right Arm, Patient Position: Sitting, Cuff Size: Normal)   Pulse 82   Resp 18   Ht '5\' 9"'$  (1.753 m)   Wt 246 lb 12.8 oz (111.9 kg)   SpO2 (!) 89%   BMI 36.45 kg/m  Wt Readings from Last 3 Encounters:  05/28/22 246 lb 12.8 oz (111.9 kg)  03/05/22 259 lb 6.4 oz (117.7 kg)  01/29/22 262 lb 9.1 oz (119.1 kg)        Assessment & Plan:   Problem List Items Addressed This Visit       Other   Chronic low  back pain without sciatica    Chronic, intermittent, worse in the morning No red flags currently Advised to perform simple back stretching exercises Naproxen as needed Robaxin as needed for muscle spasms Back brace as needed      Relevant Medications   methocarbamol (ROBAXIN) 500 MG tablet   Other Visit Diagnoses     Intertrigo    -  Primary Groin area rash could be intertrigo Ketoconazole cream prescribed He is on Farxiga, needs to maintain adequate hydration to avoid fungal infection   Relevant Medications   ketoconazole (NIZORAL) 2 % cream   Flank pain     Less likely to be UTI Will  check UA to rule out acute UTI   Relevant Orders   UA/M w/rflx Culture, Routine        Meds ordered this encounter  Medications   ketoconazole (NIZORAL) 2 % cream    Sig: Apply 1 Application topically daily.    Dispense:  15 g    Refill:  0   methocarbamol (ROBAXIN) 500 MG tablet    Sig: Take 1 tablet (500 mg total) by mouth every 12 (twelve) hours as needed for muscle spasms.    Dispense:  30 tablet    Refill:  1     Stefania Goulart Keith Rake, MD

## 2022-05-29 DIAGNOSIS — Z79899 Other long term (current) drug therapy: Secondary | ICD-10-CM | POA: Diagnosis not present

## 2022-05-29 DIAGNOSIS — K760 Fatty (change of) liver, not elsewhere classified: Secondary | ICD-10-CM | POA: Diagnosis not present

## 2022-05-29 DIAGNOSIS — I110 Hypertensive heart disease with heart failure: Secondary | ICD-10-CM | POA: Diagnosis not present

## 2022-05-29 DIAGNOSIS — Z87891 Personal history of nicotine dependence: Secondary | ICD-10-CM | POA: Diagnosis not present

## 2022-05-29 DIAGNOSIS — N289 Disorder of kidney and ureter, unspecified: Secondary | ICD-10-CM | POA: Diagnosis not present

## 2022-05-29 DIAGNOSIS — I272 Pulmonary hypertension, unspecified: Secondary | ICD-10-CM | POA: Diagnosis not present

## 2022-05-29 DIAGNOSIS — I5032 Chronic diastolic (congestive) heart failure: Secondary | ICD-10-CM | POA: Diagnosis not present

## 2022-05-29 DIAGNOSIS — R7303 Prediabetes: Secondary | ICD-10-CM | POA: Diagnosis not present

## 2022-05-30 ENCOUNTER — Telehealth: Payer: Self-pay | Admitting: Internal Medicine

## 2022-05-30 LAB — UA/M W/RFLX CULTURE, ROUTINE
Bilirubin, UA: NEGATIVE
Ketones, UA: NEGATIVE
Leukocytes,UA: NEGATIVE
Nitrite, UA: NEGATIVE
Protein,UA: NEGATIVE
RBC, UA: NEGATIVE
Specific Gravity, UA: 1.02 (ref 1.005–1.030)
Urobilinogen, Ur: 1 mg/dL (ref 0.2–1.0)
pH, UA: 7 (ref 5.0–7.5)

## 2022-05-30 LAB — MICROSCOPIC EXAMINATION
Bacteria, UA: NONE SEEN
Casts: NONE SEEN /lpf
Epithelial Cells (non renal): NONE SEEN /hpf (ref 0–10)
RBC, Urine: NONE SEEN /hpf (ref 0–2)
WBC, UA: NONE SEEN /hpf (ref 0–5)

## 2022-05-30 NOTE — Telephone Encounter (Signed)
Returned call

## 2022-05-30 NOTE — Telephone Encounter (Signed)
Patient returning call.

## 2022-06-04 ENCOUNTER — Telehealth: Payer: Self-pay

## 2022-06-04 NOTE — Chronic Care Management (AMB) (Signed)
  Care Coordination  Note  06/04/2022 Name: Ricardo Burch MRN: [id number] DOB: Jan 07, 1961  Ricardo Burch is a 61 y.o. year old male who is a primary care patient of [name], MD. I reached out to [name] by phone today to offer care coordination services.      [name] was given information about Care Coordination services today including:  The Care Coordination services include support from the care team which includes your Nurse Coordinator, Clinical Social Worker, or Pharmacist.  The Care Coordination team is here to help remove barriers to the health concerns and goals most important to you. Care Coordination services are voluntary and the patient may decline or stop services at any time by request to their care team member.   Patient did not agree to participate in care coordination services at this time.  Follow up plan: Patient declines further follow up or participation in care coordination services.   Noreene Larsson, [place], [place] [address] Direct Dial: [phone] [email or web address]

## 2022-06-05 DIAGNOSIS — J449 Chronic obstructive pulmonary disease, unspecified: Secondary | ICD-10-CM | POA: Diagnosis not present

## 2022-06-11 ENCOUNTER — Other Ambulatory Visit (HOSPITAL_COMMUNITY)
Admission: RE | Admit: 2022-06-11 | Discharge: 2022-06-11 | Disposition: A | Discharge status: Home / Self Care | Payer: Medicare Other | Source: Ambulatory Visit | Attending: Cardiovascular Disease | Admitting: Cardiovascular Disease

## 2022-06-11 DIAGNOSIS — I5032 Chronic diastolic (congestive) heart failure: Secondary | ICD-10-CM | POA: Insufficient documentation

## 2022-06-11 LAB — BASIC METABOLIC PANEL
Anion gap: 7 (ref 5–15)
BUN: 15 mg/dL (ref 6–20)
CO2: 28 mmol/L (ref 22–32)
Calcium: 8.9 mg/dL (ref 8.9–10.3)
Chloride: 100 mmol/L (ref 98–111)
Creatinine, Ser: 0.88 mg/dL (ref 0.61–1.24)
GFR, Estimated: >60 mL/min (ref >60)
Glucose, Bld: 84 mg/dL (ref 70–99)
Potassium: 4.1 mmol/L (ref 3.5–5.1)
Sodium: 135 mmol/L (ref 135–145)

## 2022-06-18 ENCOUNTER — Telehealth: Payer: Self-pay | Admitting: Pulmonary Disease

## 2022-06-18 DIAGNOSIS — J438 Other emphysema: Secondary | ICD-10-CM

## 2022-06-18 MED ORDER — ALBUTEROL SULFATE HFA 108 (90 BASE) MCG/ACT IN AERS: INHALATION_SPRAY | RESPIRATORY_TRACT | 3 refills | Status: DC

## 2022-06-18 MED ORDER — BREZTRI AEROSPHERE 160-9-4.8 MCG/ACT IN AERO: 2.0000 | INHALATION_SPRAY | Freq: Two times a day (BID) | RESPIRATORY_TRACT | 3 refills | Status: DC

## 2022-06-18 MED ORDER — FLUTICASONE PROPIONATE 50 MCG/ACT NA SUSP: NASAL | 3 refills | Status: DC

## 2022-06-18 NOTE — Telephone Encounter (Signed)
[place] and spoke with Lelon Frohlich.  Ann stated 90 days prescriptions would be best for patient.  Refills for Retta Mac, and Albuterol sent to [facility].  Nothing further at this time.

## 2022-06-26 ENCOUNTER — Encounter: Payer: Self-pay | Admitting: Pulmonary Disease

## 2022-06-26 ENCOUNTER — Ambulatory Visit (INDEPENDENT_AMBULATORY_CARE_PROVIDER_SITE_OTHER): Payer: Medicare Other | Admitting: Pulmonary Disease

## 2022-06-26 VITALS — BP 118/60 | HR 74 | Temp 98.2°F | Ht 68.0 in | Wt 240.4 lb

## 2022-06-26 DIAGNOSIS — Z9989 Dependence on other enabling machines and devices: Secondary | ICD-10-CM

## 2022-06-26 DIAGNOSIS — G4733 Obstructive sleep apnea (adult) (pediatric): Secondary | ICD-10-CM

## 2022-06-26 DIAGNOSIS — J449 Chronic obstructive pulmonary disease, unspecified: Secondary | ICD-10-CM | POA: Diagnosis not present

## 2022-06-26 MED ORDER — TRELEGY ELLIPTA 200-62.5-25 MCG/ACT IN AEPB: 1.0000 | INHALATION_SPRAY | Freq: Every day | RESPIRATORY_TRACT | 2 refills | Status: DC

## 2022-06-26 NOTE — Progress Notes (Signed)
Ricardo Burch    [id number]    [birth year]  Primary Care Physician:Patel, Colin Broach, MD  Referring Physician: [name], MD [address] [place],  [place] [address]  Chief complaint:   Follow up for Severe COPD Severe OSA on autoset Active smoker  HPI: [name] is a 61 y.o. exsmoker with COPD. He's had multiple hospitalizations with COPD, CHF exacerbations in the past.He was hospitalized from 07/26/16-07/30/16 with acute pulmonary edema, acute exacerbation of COPD. He was intubated briefly for hypercarbia.   He has finished the pulmonary rehab and reports improved dyspnea. He has been on a rotating list of inhalers mainly due to his inability to pay co-pay. [name] He has been started on CPAP after a repeat titration study and is tolerating it well. Stopped smoking in March 2021  Interim History: Continues on CPAP Continues on breztri, Daliresp.  Has stable chronic dyspnea on exertion.  He does not like the breztri and wants to change to Trelegy On supplemental oxygen  Referred to [facility] in 2022 for consideration of endobronchial lung volume reduction procedure.  He was evaluated there and referred to pulmonary hypertension clinic as Dr. [name] wanted that evaluated before doing the procedure.  Right and left heart cath at [facility] showed pre and postcapillary pulmonary hypertension due to group 3 disease and is being followed by cardiology.  Started on SGLT2 inhibitor for weight loss and has already lost 20 pounds with improvement in breathing  Outpatient Encounter Medications as of 06/26/2022  Medication Sig   albuterol (VENTOLIN HFA) 108 (90 Base) MCG/ACT inhaler INHALE 1 OR 2 PUFFS BY MOUTH EVERY 6 HOURS AS NEEDED FOR WHEEZING OR SHORTNESS OF BREATH   amLODipine (NORVASC) 5 MG tablet TAKE 1 TABLET BY MOUTH DAILY   Budeson-Glycopyrrol-Formoterol (BREZTRI AEROSPHERE) 160-9-4.8 MCG/ACT AERO Inhale 2 puffs into the lungs 2 (two) times daily.   FARXIGA 10 MG TABS tablet Take 10 mg  by mouth daily.   fluticasone (FLONASE) 50 MCG/ACT nasal spray INSTILL 2 SPRAYS IN BOTH NOSTRILS TWICE DAILY   furosemide (LASIX) 20 MG tablet Take 20 mg by mouth daily.   ketoconazole (NIZORAL) 2 % cream Apply 1 Application topically daily.   methocarbamol (ROBAXIN) 500 MG tablet Take 1 tablet (500 mg total) by mouth every 12 (twelve) hours as needed for muscle spasms.   OXYGEN Inhale 3 L into the lungs daily. continuous   OZEMPIC, 0.25 OR 0.5 MG/DOSE, 2 MG/3ML SOPN SMARTSIG:0.25 Milligram(s) SUB-Q Once a Week   roflumilast (DALIRESP) 500 MCG TABS tablet Take 1 tablet (500 mcg total) by mouth daily.   sertraline (ZOLOFT) 25 MG tablet Take 1 tablet (25 mg total) by mouth daily.   [DISCONTINUED] lidocaine (LIDODERM) 5 % Place 1 patch onto the skin daily. Remove & Discard patch within 12 hours or as directed by MD   [DISCONTINUED] naproxen (NAPROSYN) 500 MG tablet TAKE 1 TABLET BY MOUTH TWICE DAILY WITH A MEAL   No facility-administered encounter medications on file as of 06/26/2022.   Physical Exam: Blood pressure 118/60, pulse 74, temperature 98.2 F (36.8 C), temperature source Oral, height '5\' 8"'$  (1.727 m), weight 240 lb 6.4 oz (109 kg), SpO2 91 %. Gen:      No acute distress HEENT:  EOMI, sclera anicteric Neck:     No masses; no thyromegaly Lungs:    Clear to auscultation bilaterally; normal respiratory effort CV:         Regular rate and rhythm; no  murmurs Abd:      + bowel sounds; soft, non-tender; no palpable masses, no distension Ext:    No edema; adequate peripheral perfusion Skin:      Warm and dry; no rash Neuro: alert and oriented x 3 Psych: normal mood and affect   Data Reviewed: Imaging Screening CT chest 07/14/2019-emphysema, subcentimeter pulmonary nodule.  Atelectasis along the right major fissure.  Screening CT chest 08/21/2020-emphysema, upper lobe predominant.  Stable pulmonary nodules CT chest at [facility] 08/07/2021-stable pulmonary nodules I have reviewed the images  personally.  PFTs  01/16/16 FVC 2.43 [58%], FEV1 1.49 [45%), F/F 61, TLC 81%,DLCO 56% Severe obstruction, moderate reduction in diffusion capacity.  11/27/2020 FVC 2.04 [56%], FEV1 1.08 [37%], F/F 53, TLC 6.36 [99%], DLCO 12.91, 50%] Severe obstruction with air trapping and bronchodilator response.  Severe diffusion defect.  Sleep PSG 10/22/15  evere OSA, AHI 151. Started on an AutoSet CPAP titration study  Initiate CPAP at 12 with 2 L oxygen, fullface mask.   CPAP compliance 09/27/2020 100% compliant with good response   Labs CBC 08/24/17-absolute eosinophil count 254 CBC 06/13/2019-WBC 12.7, eos 1%, absolute eosinophil count 127 Alpha-1 antitrypsin 06/15/2019-134, PI MM  Cardiac Echo (09/18/15) The right ventricular systolic pressure was increased consistent  with moderate pulmonary hypertension.moderate concentric LVH. LVEF 60-65 percent. Grade 2 diastolic dysfunction. RV cavity size severe grade dilated. RV systolic function moderately to severely reduced. PA pressure 56  Assessment:  Severe COPD Continues on Daliresp Change bevespi to Trelegy as he prefers to be on a dry powder inhaler Continue supplemental oxygen Work on weight loss with SGLT2 inhibitor, diet and exercise. Has finished pulmonary rehab twice overall.  Encouraged him to stay active at home.  Severe OSA Stable on CPAP.  Continue with current setting of 12. Encouraged him to use it every day.  Download reviewed with good compliance and response He is inquiring about inspire device but his BMI is too high.  Needs to work on weight loss   Stable lung nodules Continue low-dose screening CTs He had a CT at [facility] at October 2022 which showed stable lung nodules.  Next screening CT is already scheduled in October 2023    Pulmonary hypertension Likely group 3 pulmonary hypertension secondary to COPD, OSA.   Has been seen in pulmonary hypertension clinic in [facility] maintenance Up-to-date with Covid  booster. Up-to-date with flu and pneumonia vaccination  Plan/Recommendations: - Continue Daliresp, supplemental oxygen - Change breztri to Trelegy. - Continue CPAP - Screening CT of the chest to resume in October 2023  Marshell Garfinkel MD [facility] 06/26/2022, 10:05 AM  CC: [name], MD

## 2022-06-26 NOTE — Patient Instructions (Signed)
I am glad you are doing well with your breathing We will change the breztri to Trelegy 200 Continue the CPAP You have screening CT of the chest already scheduled for October of this year I will see you back in clinic in 6 months with either in person or televisit.

## 2022-06-26 NOTE — Addendum Note (Signed)
Addended by: Elton Sin on: 06/26/2022 11:33 AM   Modules accepted: Orders

## 2022-06-27 DIAGNOSIS — J449 Chronic obstructive pulmonary disease, unspecified: Secondary | ICD-10-CM | POA: Diagnosis not present

## 2022-06-27 DIAGNOSIS — G4733 Obstructive sleep apnea (adult) (pediatric): Secondary | ICD-10-CM | POA: Diagnosis not present

## 2022-06-27 DIAGNOSIS — I272 Pulmonary hypertension, unspecified: Secondary | ICD-10-CM | POA: Diagnosis not present

## 2022-06-27 DIAGNOSIS — J9690 Respiratory failure, unspecified, unspecified whether with hypoxia or hypercapnia: Secondary | ICD-10-CM | POA: Diagnosis not present

## 2022-07-03 DIAGNOSIS — R0602 Shortness of breath: Secondary | ICD-10-CM | POA: Diagnosis not present

## 2022-07-03 DIAGNOSIS — I272 Pulmonary hypertension, unspecified: Secondary | ICD-10-CM | POA: Diagnosis not present

## 2022-07-06 DIAGNOSIS — J449 Chronic obstructive pulmonary disease, unspecified: Secondary | ICD-10-CM | POA: Diagnosis not present

## 2022-07-09 ENCOUNTER — Encounter: Payer: Self-pay | Admitting: Internal Medicine

## 2022-07-09 ENCOUNTER — Ambulatory Visit (INDEPENDENT_AMBULATORY_CARE_PROVIDER_SITE_OTHER): Payer: Medicare Other | Admitting: Internal Medicine

## 2022-07-09 VITALS — BP 104/70 | HR 65 | Resp 18 | Ht 68.0 in | Wt 244.6 lb

## 2022-07-09 DIAGNOSIS — M5136 Other intervertebral disc degeneration, lumbar region: Secondary | ICD-10-CM

## 2022-07-09 DIAGNOSIS — I10 Essential (primary) hypertension: Secondary | ICD-10-CM

## 2022-07-09 DIAGNOSIS — F3341 Major depressive disorder, recurrent, in partial remission: Secondary | ICD-10-CM | POA: Diagnosis not present

## 2022-07-09 DIAGNOSIS — Z23 Encounter for immunization: Secondary | ICD-10-CM | POA: Diagnosis not present

## 2022-07-09 DIAGNOSIS — R7303 Prediabetes: Secondary | ICD-10-CM | POA: Diagnosis not present

## 2022-07-09 DIAGNOSIS — J432 Centrilobular emphysema: Secondary | ICD-10-CM

## 2022-07-09 MED ORDER — GABAPENTIN 100 MG PO CAPS: 100.0000 mg | ORAL_CAPSULE | Freq: Every day | ORAL | 1 refills | Status: DC

## 2022-07-09 NOTE — Assessment & Plan Note (Signed)
[place] Office Visit from 07/09/2022 in [facility]  PHQ-9 Total Score 0     Likely due to chronic medical conditions Referred to [facility] therapy Had started Zoloft 25 mg daily for now, needs to take it regularly

## 2022-07-09 NOTE — Assessment & Plan Note (Signed)

## 2022-07-09 NOTE — Assessment & Plan Note (Signed)
BP Readings from Last 1 Encounters:  07/09/22 104/70   Well-controlled with amlodipine Counseled for compliance with the medications Advised DASH diet and moderate exercise/walking as tolerated

## 2022-07-09 NOTE — Progress Notes (Signed)
Established Patient Office Visit  Subjective:  Patient ID: Ricardo Burch, male    DOB: 04-Feb-1961  Age: 61 y.o. MRN: [id number]  CC:  Chief Complaint  Patient presents with   Follow-up    Follow up COPD HTN and depression patient still having pain in leg muscle relaxer did not work     HPI Ricardo Burch is a 61 y.o. male with past medical history of HTN, COPD, chronic hypoxic respiratory failure, OSA, GERD and chronic fatigue who presents for f/u of his chronic medical conditions.  HTN: BP is well-controlled. Takes medications regularly. Patient denies headache, dizziness, chest pain, or palpitations.   Chronic hypoxic respiratory failure and COPD: He uses Trelegy and as needed albuterol for COPD and follows up with Dr. Vaughan Browner.  He is being evaluated for lung reduction surgery.   CHF and pulmonary HTN: His echo showed pulmonary HTN, for which he was referred to pulmonology HTN clinic at [facility]. He currently uses home O2 at 3 LPM PRN.  He has chronic dyspnea.  He was placed on Farxiga for HFpEF and Ozempic for weight loss benefit by cardiology clinic at [facility]. He has lost about 15 lbs since 05/23.  Depression: He has started taking Zoloft regularly.  Denies any SI or HI currently.  He also reports chronic low back pain, worse with bending, heavy lifting and prolonged sitting.  Pain is sharp, chronic, intermittent, variable in intensity.  He has tried taking naproxen without much relief.  Denies any numbness or tingling of the LE, but reports referred pain b/l LE in thigh and leg area.     Past Medical History:  Diagnosis Date   Allergy    CHF (congestive heart failure) (HCC)    Chronic kidney disease    COPD (chronic obstructive pulmonary disease) ([place]) Dx 2015   Depression    Eczema    since childhood    Erectile dysfunction 08/18/2019   GERD (gastroesophageal reflux disease) 01/25/2020   Hyperlipidemia 01/12/2018   Hypertension    Oxygen deficiency    Screening for HIV  (human immunodeficiency virus)    Sleep apnea    CPAP   Substance abuse ([place])    MJ, cocaine   Testicular failure 07/22/2019    Past Surgical History:  Procedure Laterality Date   COLONOSCOPY WITH PROPOFOL N/A 06/25/2017   Procedure: COLONOSCOPY WITH PROPOFOL;  Surgeon: Daneil Dolin, MD;  [facility];  Service: Endoscopy;  Laterality: N/A;  2:00pm   MULTIPLE EXTRACTIONS WITH ALVEOLOPLASTY N/A 03/22/2016   Procedure: MULTIPLE EXTRACTION WITH ALVEOLOPLASTY;  Surgeon: Diona Browner, DDS;  Location: [facility];  Service: Oral Surgery;  Laterality: N/A;   NO PAST SURGERIES     POLYPECTOMY  06/25/2017   Procedure: POLYPECTOMY;  Surgeon: Daneil Dolin, MD;  Location[facility];  Service: Endoscopy;;  colon    Family History  Problem Relation Age of Onset   Arthritis Mother    Hypertension Mother    Stroke Mother        age 15   Cerebral aneurysm Father    Alcohol abuse Father    Cancer Brother    Diabetes Neg Hx    Heart disease Neg Hx     Social History   Socioeconomic History   Marital status: Single    Spouse name: Not on file   Number of children: 5   Years of education: 12   Highest education level: Not on file  Occupational History   Occupation:  Unemployed     Comment: car wash details  Tobacco Use   Smoking status: Former    Packs/day: 1.00    Years: 38.00    Total pack years: 38.00    Types: Cigarettes    Quit date: 02/21/2020    Years since quitting: 2.3   Smokeless tobacco: Never  Vaping Use   Vaping Use: Never used  Substance and Sexual Activity   Alcohol use: Yes    Alcohol/week: 1.0 standard drink of alcohol    Types: 1 Cans of beer per week    Comment: Socially x 1/month currently (as of 01/25/20); previously: occassionally: weekends, 6-pack or less on a day; or half pint of liquior   Drug use: Yes    Types: Marijuana, Cocaine    Comment: 01/25/20-marijuana few weeks ago, no cocaine   Sexual activity: Never  Other Topics Concern   Not on  file  Social History Narrative    Lives alone.    5 daughters 19, 69 yo twins, eldest 2 are married live in [place]. Retired.Girlfriend works in Armed forces logistics/support/administrative officer.   Social Determinants of Health   Financial Resource Strain: Low Risk  (12/31/2021)   Overall Financial Resource Strain (CARDIA)    Difficulty of Paying Living Expenses: Not very hard  Food Insecurity: No Food Insecurity (12/31/2021)   Hunger Vital Sign    Worried About Running Out of Food in the Last Year: Never true    Ran Out of Food in the Last Year: Never true  Transportation Needs: No Transportation Needs (12/31/2021)   PRAPARE - Hydrologist (Medical): No    Lack of Transportation (Non-Medical): No  Physical Activity: Insufficiently Active (02/11/2022)   Exercise Vital Sign    Days of Exercise per Week: 1 day    Minutes of Exercise per Session: 10 min  Stress: Stress Concern Present (02/11/2022)   [place]    Feeling of Stress : To some extent  Social Connections: Moderately Integrated (12/31/2021)   Social Connection and Isolation Panel [NHANES]    Frequency of Communication with Friends and Family: More than three times a week    Frequency of Social Gatherings with Friends and Family: Three times a week    Attends Religious Services: More than 4 times per year    Active Member of Clubs or Organizations: No    Attends Archivist Meetings: Never    Marital Status: Living with partner  Intimate Partner Violence: Not At Risk (12/31/2021)   Humiliation, Afraid, Rape, and Kick questionnaire    Fear of Current or Ex-Partner: No    Emotionally Abused: No    Physically Abused: No    Sexually Abused: No    Outpatient Medications Prior to Visit  Medication Sig Dispense Refill   albuterol (VENTOLIN HFA) 108 (90 Base) MCG/ACT inhaler INHALE 1 OR 2 PUFFS BY MOUTH EVERY 6 HOURS AS NEEDED FOR WHEEZING OR SHORTNESS OF BREATH  24 g 3   amLODipine (NORVASC) 5 MG tablet TAKE 1 TABLET BY MOUTH DAILY 30 tablet 10   FARXIGA 10 MG TABS tablet Take 10 mg by mouth daily.     fluticasone (FLONASE) 50 MCG/ACT nasal spray INSTILL 2 SPRAYS IN BOTH NOSTRILS TWICE DAILY 48 mL 3   Fluticasone-Umeclidin-Vilant (TRELEGY ELLIPTA) 200-62.5-25 MCG/ACT AEPB Inhale 1 puff into the lungs daily. 60 each 2   furosemide (LASIX) 20 MG tablet Take 20 mg by mouth daily.  ketoconazole (NIZORAL) 2 % cream Apply 1 Application topically daily. 15 g 0   OXYGEN Inhale 3 L into the lungs daily. continuous     OZEMPIC, 0.25 OR 0.5 MG/DOSE, 2 MG/3ML SOPN SMARTSIG:0.25 Milligram(s) SUB-Q Once a Week     roflumilast (DALIRESP) 500 MCG TABS tablet Take 1 tablet (500 mcg total) by mouth daily. 30 tablet 5   sertraline (ZOLOFT) 25 MG tablet Take 1 tablet (25 mg total) by mouth daily. 90 tablet 1   Fluticasone-Umeclidin-Vilant (TRELEGY ELLIPTA) 200-62.5-25 MCG/ACT AEPB Inhale 1 puff into the lungs daily. (Patient not taking: Reported on 07/09/2022) 60 each 2   methocarbamol (ROBAXIN) 500 MG tablet Take 1 tablet (500 mg total) by mouth every 12 (twelve) hours as needed for muscle spasms. (Patient not taking: Reported on 07/09/2022) 30 tablet 1   No facility-administered medications prior to visit.    Allergies  Allergen Reactions   Apresoline [Hydralazine] Other (See Comments)    Headache     ROS Review of Systems  Constitutional:  Positive for fatigue. Negative for chills and fever.  HENT:  Negative for congestion and sore throat.   Eyes:  Negative for pain and discharge.  Respiratory:  Positive for shortness of breath. Negative for cough.   Cardiovascular:  Negative for chest pain and palpitations.  Gastrointestinal:  Negative for diarrhea, nausea and vomiting.  Endocrine: Negative for polydipsia and polyuria.  Genitourinary:  Negative for dysuria and hematuria.  Musculoskeletal:  Positive for back pain. Negative for neck pain and neck stiffness.   Skin:  Negative for rash.  Neurological:  Negative for dizziness, weakness, numbness and headaches.  Psychiatric/Behavioral:  Positive for dysphoric mood. Negative for agitation and behavioral problems.       Objective:    Physical Exam Vitals reviewed.  Constitutional:      General: He is not in acute distress.    Appearance: He is not diaphoretic.  HENT:     Head: Normocephalic and atraumatic.     Nose: Nose normal.     Mouth/Throat:     Mouth: Mucous membranes are moist.  Eyes:     General: No scleral icterus.    Extraocular Movements: Extraocular movements intact.  Cardiovascular:     Rate and Rhythm: Normal rate and regular rhythm.     Pulses: Normal pulses.     Heart sounds: Normal heart sounds. No murmur heard. Pulmonary:     Breath sounds: Normal breath sounds. No wheezing or rales.     Comments: On home O2 - 3 lpm Musculoskeletal:     Cervical back: Neck supple. No tenderness.     Right lower leg: No edema.     Left lower leg: No edema.  Skin:    General: Skin is warm.     Findings: No rash.  Neurological:     General: No focal deficit present.     Mental Status: He is alert and oriented to person, place, and time.     Cranial Nerves: No cranial nerve deficit.     Sensory: No sensory deficit.     Motor: No weakness.  Psychiatric:        Mood and Affect: Mood normal.        Behavior: Behavior normal.     BP 104/70 (BP Location: Right Arm, Patient Position: Sitting, Cuff Size: Normal)   Pulse 65   Resp 18   Ht 5' 8"  (1.727 m)   Wt 244 lb 9.6 oz (110.9 kg)   SpO2 91%  BMI 37.19 kg/m  Wt Readings from Last 3 Encounters:  07/09/22 244 lb 9.6 oz (110.9 kg)  06/26/22 240 lb 6.4 oz (109 kg)  05/28/22 246 lb 12.8 oz (111.9 kg)    Lab Results  Component Value Date   TSH 0.787 05/16/2022   Lab Results  Component Value Date   WBC 8.7 05/16/2022   HGB 16.4 05/16/2022   HCT 49.4 05/16/2022   MCV 91 05/16/2022   PLT 210 05/16/2022   Lab Results   Component Value Date   NA 135 06/11/2022   K 4.1 06/11/2022   CO2 28 06/11/2022   GLUCOSE 84 06/11/2022   BUN 15 06/11/2022   CREATININE 0.88 06/11/2022   BILITOT 1.4 (H) 05/16/2022   ALKPHOS 102 05/16/2022   AST 20 05/16/2022   ALT 18 05/16/2022   PROT 7.5 05/16/2022   ALBUMIN 4.3 05/16/2022   CALCIUM 8.9 06/11/2022   ANIONGAP 7 06/11/2022   EGFR 101 05/16/2022   Lab Results  Component Value Date   CHOL 156 05/16/2022   Lab Results  Component Value Date   HDL 51 05/16/2022   Lab Results  Component Value Date   LDLCALC 94 05/16/2022   Lab Results  Component Value Date   TRIG 52 05/16/2022   Lab Results  Component Value Date   CHOLHDL 3.1 05/16/2022   Lab Results  Component Value Date   HGBA1C 5.7 (H) 05/16/2022      Assessment & Plan:   Problem List Items Addressed This Visit       Cardiovascular and Mediastinum   Hypertension    BP Readings from Last 1 Encounters:  07/09/22 104/70  Well-controlled with amlodipine Counseled for compliance with the medications Advised DASH diet and moderate exercise/walking as tolerated        Respiratory   COPD (chronic obstructive pulmonary disease) (HCC) (Chronic)    On Trelegy and as needed albuterol On Daliresp Followed by pulmonology- Dr. Vaughan Browner Was referred to [facility] for lung reduction surgery        Musculoskeletal and Integument   DDD (degenerative disc disease), lumbar - Primary    Chronic, intermittent, worse in the morning No red flags currently Advised to perform simple back stretching exercises Naproxen as needed Robaxin as needed for muscle spasms, but did not help much Back brace as needed Old X-ray of lumbar spine showed DDD of lumbar spine Referred to PT Started gabapentin 100 mg nightly      Relevant Medications   gabapentin (NEURONTIN) 100 MG capsule   Other Relevant Orders   Ambulatory referral to Physical Therapy     Other   MDD (major depressive disorder), recurrent, in  partial remission ([place])    [place] Office Visit from 07/09/2022 in [facility]  PHQ-9 Total Score 0  Likely due to chronic medical conditions Referred to [facility] therapy Had started Zoloft 25 mg daily for now, needs to take it regularly      Pre-diabetes    HbA1C: 5.8 On Ozempic, for weight loss, started by Cardiology at [facility]      Need for immunization against influenza    Patient was educated on the recommendation for flu vaccine. After obtaining informed consent, the vaccine was administered no adverse effects noted at time of administration. Patient provided with education on arm soreness and use of tylenol or ibuprofen (if safe) for this. Encourage to use the arm vaccine was given in to help reduce the soreness. Patient educated on the signs  of a reaction to the vaccine and advised to contact the office should these occur.       Relevant Orders   Flu Vaccine QUAD 79moIM (Fluarix, Fluzone & Alfiuria Quad PF) (Completed)    Meds ordered this encounter  Medications   gabapentin (NEURONTIN) 100 MG capsule    Sig: Take 1 capsule (100 mg total) by mouth at bedtime.    Dispense:  90 capsule    Refill:  1    Follow-up: Return in about 4 months (around 11/08/2022) for Low back pain and HTN.    RLindell Spar MD

## 2022-07-09 NOTE — Assessment & Plan Note (Signed)
Chronic, intermittent, worse in the morning No red flags currently Advised to perform simple back stretching exercises Naproxen as needed Robaxin as needed for muscle spasms, but did not help much Back brace as needed Old X-ray of lumbar spine showed DDD of lumbar spine Referred to PT Started gabapentin 100 mg nightly

## 2022-07-09 NOTE — Assessment & Plan Note (Signed)
On Trelegy and as needed albuterol On Daliresp Followed by pulmonology- Dr. Mannam Was referred to [facility] for lung reduction surgery 

## 2022-07-09 NOTE — Assessment & Plan Note (Signed)
HbA1C: 5.8 On Ozempic, for weight loss, started by Cardiology at [facility]

## 2022-07-09 NOTE — Patient Instructions (Signed)
Please take Gabapentin as prescribed.  Please continue taking other medications as prescribed.  Please continue to follow low salt diet and ambulate as tolerated.

## 2022-07-19 ENCOUNTER — Ambulatory Visit (INDEPENDENT_AMBULATORY_CARE_PROVIDER_SITE_OTHER): Payer: Medicare Other | Admitting: Family Medicine

## 2022-07-19 ENCOUNTER — Encounter: Payer: Self-pay | Admitting: Family Medicine

## 2022-07-19 DIAGNOSIS — J302 Other seasonal allergic rhinitis: Secondary | ICD-10-CM

## 2022-07-19 MED ORDER — LEVOCETIRIZINE DIHYDROCHLORIDE 5 MG PO TABS: 5.0000 mg | ORAL_TABLET | Freq: Every evening | ORAL | 0 refills | Status: DC

## 2022-07-19 NOTE — Progress Notes (Signed)
Virtual Visit via Telephone Note   This visit type was conducted due to national recommendations for restrictions regarding the COVID-19 Pandemic (e.g. social distancing) in an effort to limit this patient's exposure and mitigate transmission in our community.  Due to his co-morbid illnesses, this patient is at least at moderate risk for complications without adequate follow up.  This format is felt to be most appropriate for this patient at this time.  The patient did not have access to video technology/had technical difficulties with video requiring transitioning to audio format only (telephone).  All issues noted in this document were discussed and addressed.  No physical exam could be performed with this format.  Please refer to the patient's chart for his  consent to telehealth for [facility].   Evaluation Performed:  Follow-up visit  Date:  07/19/2022   ID:  Ricardo Burch, DOB 02/11/1961, MRN [id number]  Patient Location: Home Provider Location: Office/Clinic  Participants: Patient Location of Patient: Home Location of Provider: Telehealth Consent was obtain for visit to be over via telehealth. I verified that I am speaking with the correct person using two identifiers.  PCP:  Ricardo Spar, MD   Chief Complaint:    History of Present Illness:    Ricardo Burch is a 61 y.o. male with c/o that his left ear is stopped up and reports putting peroxide in his ears with minimum relief. He attributed his current symptoms to cerumen buildup and asked to be sent some ear drops for symptom relief. He also c/o runny nose, eyes watering and itching for two days. He denies sinus pressure, pain, sore throat, congestion, body aches, and fever. No recent sick contacts were reported.    The patient does not have symptoms concerning for COVID-19 infection (fever, chills, cough, or new shortness of breath).   Past Medical, Surgical, Social History, Allergies, and Medications have been  Reviewed.  Past Medical History:  Diagnosis Date   Allergy    CHF (congestive heart failure) (HCC)    Chronic kidney disease    COPD (chronic obstructive pulmonary disease) ([place]) Dx 2015   Depression    Eczema    since childhood    Erectile dysfunction 08/18/2019   GERD (gastroesophageal reflux disease) 01/25/2020   Hyperlipidemia 01/12/2018   Hypertension    Oxygen deficiency    Screening for HIV (human immunodeficiency virus)    Sleep apnea    CPAP   Substance abuse ([place])    MJ, cocaine   Testicular failure 07/22/2019   Past Surgical History:  Procedure Laterality Date   COLONOSCOPY WITH PROPOFOL N/A 06/25/2017   Procedure: COLONOSCOPY WITH PROPOFOL;  Surgeon: Daneil Dolin, MD;  [facility];  Service: Endoscopy;  Laterality: N/A;  2:00pm   MULTIPLE EXTRACTIONS WITH ALVEOLOPLASTY N/A 03/22/2016   Procedure: MULTIPLE EXTRACTION WITH ALVEOLOPLASTY;  Surgeon: Diona Browner, DDS;  Location: [place];  Service: Oral Surgery;  Laterality: N/A;   NO PAST SURGERIES     POLYPECTOMY  06/25/2017   Procedure: POLYPECTOMY;  Surgeon: Daneil Dolin, MD;  [facility];  Service: Endoscopy;;  colon     Current Meds  Medication Sig   albuterol (VENTOLIN HFA) 108 (90 Base) MCG/ACT inhaler INHALE 1 OR 2 PUFFS BY MOUTH EVERY 6 HOURS AS NEEDED FOR WHEEZING OR SHORTNESS OF BREATH   amLODipine (NORVASC) 5 MG tablet TAKE 1 TABLET BY MOUTH DAILY   FARXIGA 10 MG TABS tablet Take 10 mg by mouth daily.  fluticasone (FLONASE) 50 MCG/ACT nasal spray INSTILL 2 SPRAYS IN BOTH NOSTRILS TWICE DAILY   Fluticasone-Umeclidin-Vilant (TRELEGY ELLIPTA) 200-62.5-25 MCG/ACT AEPB Inhale 1 puff into the lungs daily.   furosemide (LASIX) 20 MG tablet Take 20 mg by mouth daily.   gabapentin (NEURONTIN) 100 MG capsule Take 1 capsule (100 mg total) by mouth at bedtime.   ketoconazole (NIZORAL) 2 % cream Apply 1 Application topically daily.   levocetirizine (XYZAL) 5 MG tablet Take 1 tablet (5 mg  total) by mouth every evening.   OXYGEN Inhale 3 L into the lungs daily. continuous   OZEMPIC, 0.25 OR 0.5 MG/DOSE, 2 MG/3ML SOPN SMARTSIG:0.25 Milligram(s) SUB-Q Once a Week   roflumilast (DALIRESP) 500 MCG TABS tablet Take 1 tablet (500 mcg total) by mouth daily.   sertraline (ZOLOFT) 25 MG tablet Take 1 tablet (25 mg total) by mouth daily.     Allergies:   Apresoline [hydralazine]   ROS:   Please see the history of present illness.     All other systems reviewed and are negative.   Labs/Other Tests and Data Reviewed:    Recent Labs: 05/16/2022: ALT 18; Hemoglobin 16.4; Platelets 210; TSH 0.787 06/11/2022: BUN 15; Creatinine, Ser 0.88; Potassium 4.1; Sodium 135   Recent Lipid Panel Lab Results  Component Value Date/Time   CHOL 156 05/16/2022 10:44 AM   TRIG 52 05/16/2022 10:44 AM   HDL 51 05/16/2022 10:44 AM   CHOLHDL 3.1 05/16/2022 10:44 AM   CHOLHDL 2.8 10/25/2020 09:26 AM   LDLCALC 94 05/16/2022 10:44 AM   LDLCALC 86 10/25/2020 09:26 AM    Wt Readings from Last 3 Encounters:  07/09/22 244 lb 9.6 oz (110.9 kg)  06/26/22 240 lb 6.4 oz (109 kg)  05/28/22 246 lb 12.8 oz (111.9 kg)     Objective:    Vital Signs:  There were no vitals taken for this visit.     ASSESSMENT & PLAN:   Seasonal Allergies Informed patient to use OTC debrox ear wax removal kit Informed patient that I can not send him ear drops without physical examination of his ears He denies ear pain, drainage, and decreased hearing in the affected ear Encouraged to f/u on 07/22/22 if symptoms do not relent Will treat allergy symptoms with Xyzal 5 mg  Time:   Today, I have spent 8 minutes reviewing the chart, including problem list, medications, and with the patient with telehealth technology discussing the above problems.   Medication Adjustments/Labs and Tests Ordered: Current medicines are reviewed at length with the patient today.  Concerns regarding medicines are outlined above.   Tests  Ordered: No orders of the defined types were placed in this encounter.   Medication Changes: Meds ordered this encounter  Medications   levocetirizine (XYZAL) 5 MG tablet    Sig: Take 1 tablet (5 mg total) by mouth every evening.    Dispense:  30 tablet    Refill:  0     Note: This dictation was prepared with Dragon dictation along with smaller phrase technology. Similar sounding words can be transcribed inadequately or may not be corrected upon review. Any transcriptional errors that result from this process are unintentional.      Disposition:  Follow up  Signed, Alvira Monday, FNP  07/19/2022 10:13 PM     [facility]

## 2022-07-22 ENCOUNTER — Encounter: Payer: Self-pay | Admitting: Internal Medicine

## 2022-07-22 ENCOUNTER — Ambulatory Visit (INDEPENDENT_AMBULATORY_CARE_PROVIDER_SITE_OTHER): Payer: Medicare Other | Admitting: Internal Medicine

## 2022-07-22 VITALS — BP 118/64 | HR 82 | Ht 69.0 in | Wt 243.0 lb

## 2022-07-22 DIAGNOSIS — H6122 Impacted cerumen, left ear: Secondary | ICD-10-CM | POA: Insufficient documentation

## 2022-07-22 DIAGNOSIS — H6502 Acute serous otitis media, left ear: Secondary | ICD-10-CM

## 2022-07-22 DIAGNOSIS — J9611 Chronic respiratory failure with hypoxia: Secondary | ICD-10-CM

## 2022-07-22 MED ORDER — OFLOXACIN 0.3 % OT SOLN: 5.0000 [drp] | Freq: Every day | OTIC | 0 refills | Status: DC

## 2022-07-22 NOTE — Assessment & Plan Note (Signed)
Left ear fullness likely due to impacted cerumen Left ear irrigation done today, patient tolerated procedure well. Advised to use Debrox eardrops as needed Avoid using any sharp objects for cleaning purposes

## 2022-07-22 NOTE — Patient Instructions (Signed)
Please apply Ofloxacin ear drops as prescribed.

## 2022-07-22 NOTE — Progress Notes (Signed)
Acute Office Visit  Subjective:    Patient ID: Ricardo Burch, male    DOB: Mar 18, 1961, 61 y.o.   MRN: [id number]  Chief Complaint  Patient presents with   Ear Pain    Left ear pain started 07-18-22 feels full.    HPI Patient is in today for left ear fullness for the last 5 days.  He denies any ear pain or discharge.  He has tried applying peroxide eardrops and has cotton placed in his left ear today.  He removed the cotton piece during the visit, which had wax attached to it.  He states that he already feels better in terms of hearing after taking out cotton piece.  Denies any fever, chills, nausea, vomiting or dizziness currently.  Denies using any sharp objects for cleaning purposes.  Past Medical History:  Diagnosis Date   Allergy    CHF (congestive heart failure) (HCC)    Chronic kidney disease    COPD (chronic obstructive pulmonary disease) ([place]) Dx 2015   Depression    Eczema    since childhood    Erectile dysfunction 08/18/2019   GERD (gastroesophageal reflux disease) 01/25/2020   Hyperlipidemia 01/12/2018   Hypertension    Oxygen deficiency    Screening for HIV (human immunodeficiency virus)    Sleep apnea    CPAP   Substance abuse ([place])    MJ, cocaine   Testicular failure 07/22/2019    Past Surgical History:  Procedure Laterality Date   COLONOSCOPY WITH PROPOFOL N/A 06/25/2017   Procedure: COLONOSCOPY WITH PROPOFOL;  Surgeon: Daneil Dolin, MD;  [facility];  Service: Endoscopy;  Laterality: N/A;  2:00pm   MULTIPLE EXTRACTIONS WITH ALVEOLOPLASTY N/A 03/22/2016   Procedure: MULTIPLE EXTRACTION WITH ALVEOLOPLASTY;  Surgeon: Diona Browner, DDS;  Location: [place];  Service: Oral Surgery;  Laterality: N/A;   NO PAST SURGERIES     POLYPECTOMY  06/25/2017   Procedure: POLYPECTOMY;  Surgeon: Daneil Dolin, MD;  [facility];  Service: Endoscopy;;  colon    Family History  Problem Relation Age of Onset   Arthritis Mother    Hypertension Mother     Stroke Mother        age 70   Cerebral aneurysm Father    Alcohol abuse Father    Cancer Brother    Diabetes Neg Hx    Heart disease Neg Hx     Social History   Socioeconomic History   Marital status: Single    Spouse name: Not on file   Number of children: 5   Years of education: 12   Highest education level: Not on file  Occupational History   Occupation: Unemployed     Comment: car wash details  Tobacco Use   Smoking status: Former    Packs/day: 1.00    Years: 38.00    Total pack years: 38.00    Types: Cigarettes    Quit date: 02/21/2020    Years since quitting: 2.4   Smokeless tobacco: Never  Vaping Use   Vaping Use: Never used  Substance and Sexual Activity   Alcohol use: Yes    Alcohol/week: 1.0 standard drink of alcohol    Types: 1 Cans of beer per week    Comment: Socially x 1/month currently (as of 01/25/20); previously: occassionally: weekends, 6-pack or less on a day; or half pint of liquior   Drug use: Yes    Types: Marijuana, Cocaine    Comment: 01/25/20-marijuana few weeks ago,  no cocaine   Sexual activity: Never  Other Topics Concern   Not on file  Social History Narrative    Lives alone.    5 daughters 67, 31 yo twins, eldest 2 are married live in [place]. Retired.Girlfriend works in Armed forces logistics/support/administrative officer.   Social Determinants of Health   Financial Resource Strain: Low Risk  (12/31/2021)   Overall Financial Resource Strain (CARDIA)    Difficulty of Paying Living Expenses: Not very hard  Food Insecurity: No Food Insecurity (12/31/2021)   Hunger Vital Sign    Worried About Running Out of Food in the Last Year: Never true    Ran Out of Food in the Last Year: Never true  Transportation Needs: No Transportation Needs (12/31/2021)   PRAPARE - Hydrologist (Medical): No    Lack of Transportation (Non-Medical): No  Physical Activity: Insufficiently Active (02/11/2022)   Exercise Vital Sign    Days of Exercise per Week: 1 day     Minutes of Exercise per Session: 10 min  Stress: Stress Concern Present (02/11/2022)   [place]    Feeling of Stress : To some extent  Social Connections: Moderately Integrated (12/31/2021)   Social Connection and Isolation Panel [NHANES]    Frequency of Communication with Friends and Family: More than three times a week    Frequency of Social Gatherings with Friends and Family: Three times a week    Attends Religious Services: More than 4 times per year    Active Member of Clubs or Organizations: No    Attends Archivist Meetings: Never    Marital Status: Living with partner  Intimate Partner Violence: Not At Risk (12/31/2021)   Humiliation, Afraid, Rape, and Kick questionnaire    Fear of Current or Ex-Partner: No    Emotionally Abused: No    Physically Abused: No    Sexually Abused: No    Outpatient Medications Prior to Visit  Medication Sig Dispense Refill   albuterol (VENTOLIN HFA) 108 (90 Base) MCG/ACT inhaler INHALE 1 OR 2 PUFFS BY MOUTH EVERY 6 HOURS AS NEEDED FOR WHEEZING OR SHORTNESS OF BREATH 24 g 3   amLODipine (NORVASC) 5 MG tablet TAKE 1 TABLET BY MOUTH DAILY 30 tablet 10   FARXIGA 10 MG TABS tablet Take 10 mg by mouth daily.     fluticasone (FLONASE) 50 MCG/ACT nasal spray INSTILL 2 SPRAYS IN BOTH NOSTRILS TWICE DAILY 48 mL 3   Fluticasone-Umeclidin-Vilant (TRELEGY ELLIPTA) 200-62.5-25 MCG/ACT AEPB Inhale 1 puff into the lungs daily. 60 each 2   furosemide (LASIX) 20 MG tablet Take 20 mg by mouth daily.     gabapentin (NEURONTIN) 100 MG capsule Take 1 capsule (100 mg total) by mouth at bedtime. 90 capsule 1   ketoconazole (NIZORAL) 2 % cream Apply 1 Application topically daily. 15 g 0   levocetirizine (XYZAL) 5 MG tablet Take 1 tablet (5 mg total) by mouth every evening. 30 tablet 0   OXYGEN Inhale 3 L into the lungs daily. continuous     OZEMPIC, 0.25 OR 0.5 MG/DOSE, 2 MG/3ML SOPN SMARTSIG:0.25  Milligram(s) SUB-Q Once a Week     roflumilast (DALIRESP) 500 MCG TABS tablet Take 1 tablet (500 mcg total) by mouth daily. 30 tablet 5   sertraline (ZOLOFT) 25 MG tablet Take 1 tablet (25 mg total) by mouth daily. 90 tablet 1   No facility-administered medications prior to visit.    Allergies  Allergen Reactions   Apresoline [Hydralazine] Other (See Comments)    Headache     Review of Systems  Constitutional:  Positive for fatigue. Negative for chills and fever.  HENT:  Negative for congestion and sore throat.        L ear fullness  Eyes:  Negative for pain and discharge.  Respiratory:  Positive for shortness of breath. Negative for cough.   Cardiovascular:  Negative for chest pain and palpitations.  Gastrointestinal:  Negative for diarrhea, nausea and vomiting.  Endocrine: Negative for polydipsia and polyuria.  Genitourinary:  Negative for dysuria and hematuria.  Musculoskeletal:  Positive for back pain. Negative for neck pain and neck stiffness.  Skin:  Negative for rash.  Neurological:  Negative for dizziness, weakness, numbness and headaches.  Psychiatric/Behavioral:  Positive for dysphoric mood. Negative for agitation and behavioral problems.        Objective:    Physical Exam Vitals reviewed.  Constitutional:      General: He is not in acute distress.    Appearance: He is not diaphoretic.  HENT:     Head: Normocephalic and atraumatic.     Right Ear: External ear normal. There is no impacted cerumen.     Left Ear: External ear normal. A middle ear effusion is present. There is impacted cerumen. Tympanic membrane is injected.     Nose: Nose normal.     Mouth/Throat:     Mouth: Mucous membranes are moist.  Eyes:     General: No scleral icterus.    Extraocular Movements: Extraocular movements intact.  Cardiovascular:     Rate and Rhythm: Normal rate and regular rhythm.     Pulses: Normal pulses.     Heart sounds: Normal heart sounds. No murmur heard. Pulmonary:      Breath sounds: Normal breath sounds. No wheezing or rales.     Comments: On home O2 - 3 lpm Musculoskeletal:     Cervical back: Neck supple. No tenderness.     Right lower leg: No edema.     Left lower leg: No edema.  Skin:    General: Skin is warm.     Findings: No rash.  Neurological:     General: No focal deficit present.     Mental Status: He is alert and oriented to person, place, and time.     Sensory: No sensory deficit.     Motor: No weakness.  Psychiatric:        Mood and Affect: Mood normal.        Behavior: Behavior normal.     BP 118/64   Pulse 82   Ht '5\' 9"'$  (1.753 m)   Wt 243 lb (110.2 kg)   SpO2 90%   BMI 35.88 kg/m  Wt Readings from Last 3 Encounters:  07/22/22 243 lb (110.2 kg)  07/09/22 244 lb 9.6 oz (110.9 kg)  06/26/22 240 lb 6.4 oz (109 kg)        Assessment & Plan:   Problem List Items Addressed This Visit       Respiratory   Chronic respiratory failure with hypoxia (HCC)    Uses continuous home O2 at 3 LPM Due to COPD        Nervous and Auditory   Excessive ear wax, left    Left ear fullness likely due to impacted cerumen Left ear irrigation done today, patient tolerated procedure well. Advised to use Debrox eardrops as needed Avoid using any sharp objects for cleaning purposes  Other Visit Diagnoses     Non-recurrent acute serous otitis media of left ear    -  Primary Left middle ear effusion with injected TM noted after ear irrigation Started ofloxacin eardrops   Relevant Medications   ofloxacin (FLOXIN OTIC) 0.3 % OTIC solution        Meds ordered this encounter  Medications   ofloxacin (FLOXIN OTIC) 0.3 % OTIC solution    Sig: Place 5 drops into the left ear daily.    Dispense:  5 mL    Refill:  0     Tayo Maute Keith Rake, MD

## 2022-07-22 NOTE — Assessment & Plan Note (Signed)
Uses continuous home O2 at 3 LPM Due to COPD

## 2022-07-28 DIAGNOSIS — I272 Pulmonary hypertension, unspecified: Secondary | ICD-10-CM | POA: Diagnosis not present

## 2022-07-28 DIAGNOSIS — J449 Chronic obstructive pulmonary disease, unspecified: Secondary | ICD-10-CM | POA: Diagnosis not present

## 2022-07-28 DIAGNOSIS — J9690 Respiratory failure, unspecified, unspecified whether with hypoxia or hypercapnia: Secondary | ICD-10-CM | POA: Diagnosis not present

## 2022-07-28 DIAGNOSIS — G4733 Obstructive sleep apnea (adult) (pediatric): Secondary | ICD-10-CM | POA: Diagnosis not present

## 2022-07-31 ENCOUNTER — Telehealth: Payer: Self-pay | Admitting: *Deleted

## 2022-07-31 ENCOUNTER — Encounter: Payer: Self-pay | Admitting: *Deleted

## 2022-07-31 ENCOUNTER — Telehealth: Payer: Self-pay | Admitting: Internal Medicine

## 2022-07-31 NOTE — Patient Outreach (Signed)
  Care Coordination   07/31/2022 Name: Ricardo Burch MRN: [id number] DOB: October 05, 1961   Care Coordination Outreach Attempts:  An unsuccessful telephone outreach was attempted today to offer the patient information about available care coordination services as a benefit of their health plan.   Follow Up Plan:  Additional outreach attempts will be made to offer the patient care coordination information and services.   Encounter Outcome:  No Answer  Care Coordination Interventions Activated:  No   Care Coordination Interventions:  No, not indicated    Jacqlyn Larsen [facility], [place] RN Care Coordinator [phone]

## 2022-07-31 NOTE — Telephone Encounter (Signed)
Patient called in regard to tele on 9/18  Patient states that ear is feeling better but is having a clear drainage.  Wants a call back

## 2022-07-31 NOTE — Telephone Encounter (Signed)
Pt advised with verbal understanding  °

## 2022-07-31 NOTE — Patient Outreach (Signed)
  Care Coordination   Initial Visit Note   07/31/2022 Name: Ricardo Burch MRN: [id number] DOB: September 07, 1961  Ricardo Burch is a 61 y.o. year old male who sees [name], MD for primary care. I spoke with  Ricardo Burch by phone today.  What matters to the patients health and wellness today?  "to stay healthy and keep losing weight"    Goals Addressed               This Visit's Progress     "to stay healthy and keep losing weight" (pt-stated)        Care Coordination Interventions: Basic overview and discussion of pathophysiology of Heart Failure reviewed Assessed need for readable accurate scales in home Provided education about placing scale on hard, flat surface Advised patient to weigh each morning after emptying bladder Discussed importance of daily weight and advised patient to weigh and record daily Reviewed role of diuretics in prevention of fluid overload and management of heart failure; Discussed the importance of keeping all appointments with provider Screening for signs and symptoms of depression related to chronic disease state  Assessed social determinant of health barriers  Advised patient to track and manage COPD triggers Advised patient to self assesses COPD action plan zone and make appointment with provider if in the yellow zone for 48 hours without improvement Advised patient to engage in light exercise as tolerated 3-5 days a week to aid in the the management of COPD Provided education about and advised patient to utilize infection prevention strategies to reduce risk of respiratory infection Reviewed CHF and COPD action plan with emphasis on yellow zone Explained care coordination program, pt agreeable to today's outreach, states he is familiar with action plans, weighs several times per week, uses inhalers and oxygen as prescribed per pt report, has all medications and taking as prescribed, declines further follow up Annual wellness visit completed on 12/31/21 Pt  reports he is on ozempic and has lost weight, plans to continue trying to lose weight and eat healthy          SDOH assessments and interventions completed:  Yes  SDOH Interventions Today    Flowsheet Row Most Recent Value  SDOH Interventions   Food Insecurity Interventions Intervention Not Indicated  Transportation Interventions Intervention Not Indicated        Care Coordination Interventions Activated:  Yes  Care Coordination Interventions:  Yes, provided   Follow up plan: No further intervention required.   Encounter Outcome:  Pt. Visit Completed   Jacqlyn Larsen [facility], BSN [facility] RN Care Coordinator [phone]

## 2022-08-01 NOTE — Progress Notes (Signed)
CARDIOLOGY CONSULT NOTE       Patient ID: Ricardo Burch MRN: [id number] DOB/AGE: April 22, 1961 61 y.o.  Admit date: (Not on file) Referring Physician: [name] Primary Physician: [name], MD Primary Cardiologist: New to me previously Ricardo Burch Reason for Consultation: Pulmonary HTN  Active Problems:   * No active hospital problems. *   HPI:  61 y.o. previously followed by Dr Ricardo Burch referred by Dr [name] for continuity of care for dyspnea and pulmonary HTN.  This is WHO Class 3 from severe COPD /OSA on oxygen 3L 's at home Quit smoking smoking Using CPAP Does mechanic work   TTE 09/24/18 EF 60-65% moderate LVH RV mod/severely dilated with D shaped septum   Lung cancer CT 08/21/20 no cancer   Seen by pulmonary locally Dr Ricardo Burch and by cards/pulmonary at [facility] Dr Ricardo Burch and [place] Dr Ricardo Burch  Echo there 07/03/22 RV mildly enlarged normal TAPSE Trivial TR with estimated PA systolic pressure 49 mmHg LVEF normal mild LVH noted moderate RV systolic dysfunction but improved RVSP compared to TTE done 08/28/21   He had 6 minute walk test 07/03/22 but on oxygen and no desats Has lost some weight on Ozempic  He has 5 daughters that live in [place] most went to [place] [employer]  ROS All other systems reviewed and negative except as noted above  Past Medical History:  Diagnosis Date   Allergy    CHF (congestive heart failure) (HCC)    Chronic kidney disease    COPD (chronic obstructive pulmonary disease) ([place]) Dx 2015   Depression    Eczema    since childhood    Erectile dysfunction 08/18/2019   GERD (gastroesophageal reflux disease) 01/25/2020   Hyperlipidemia 01/12/2018   Hypertension    Oxygen deficiency    Screening for HIV (human immunodeficiency virus)    Sleep apnea    CPAP   Substance abuse ([place])    MJ, cocaine   Testicular failure 07/22/2019    Family History  Problem Relation Age of Onset   Arthritis Mother    Hypertension Mother    Stroke Mother        age 80    Cerebral aneurysm Father    Alcohol abuse Father    Cancer Brother    Diabetes Neg Hx    Heart disease Neg Hx     Social History   Socioeconomic History   Marital status: Single    Spouse name: Not on file   Number of children: 5   Years of education: 12   Highest education level: Not on file  Occupational History   Occupation: Unemployed     Comment: car wash details  Tobacco Use   Smoking status: Former    Packs/day: 1.00    Years: 38.00    Total pack years: 38.00    Types: Cigarettes    Quit date: 02/21/2020    Years since quitting: 2.4   Smokeless tobacco: Never  Vaping Use   Vaping Use: Never used  Substance and Sexual Activity   Alcohol use: Yes    Alcohol/week: 1.0 standard drink of alcohol    Types: 1 Cans of beer per week    Comment: Socially x 1/month currently (as of 01/25/20); previously: occassionally: weekends, 6-pack or less on a day; or half pint of liquior   Drug use: Yes    Types: Marijuana, Cocaine    Comment: 01/25/20-marijuana few weeks ago, no cocaine   Sexual activity: Never  Other Topics Concern   Not  on file  Social History Narrative    Lives alone.    5 daughters 89, 59 yo twins, eldest 2 are married live in [place]. Retired.Girlfriend works in Armed forces logistics/support/administrative officer.   Social Determinants of Health   Financial Resource Strain: Low Risk  (12/31/2021)   Overall Financial Resource Strain (CARDIA)    Difficulty of Paying Living Expenses: Not very hard  Food Insecurity: No Food Insecurity (07/31/2022)   Hunger Vital Sign    Worried About Running Out of Food in the Last Year: Never true    Ran Out of Food in the Last Year: Never true  Transportation Needs: No Transportation Needs (07/31/2022)   PRAPARE - Hydrologist (Medical): No    Lack of Transportation (Non-Medical): No  Physical Activity: Insufficiently Active (02/11/2022)   Exercise Vital Sign    Days of Exercise per Week: 1 day    Minutes of Exercise per Session: 10  min  Stress: Stress Concern Present (02/11/2022)   [place]    Feeling of Stress : To some extent  Social Connections: Moderately Integrated (12/31/2021)   Social Connection and Isolation Panel [NHANES]    Frequency of Communication with Friends and Family: More than three times a week    Frequency of Social Gatherings with Friends and Family: Three times a week    Attends Religious Services: More than 4 times per year    Active Member of Clubs or Organizations: No    Attends Archivist Meetings: Never    Marital Status: Living with partner  Intimate Partner Violence: Not At Risk (12/31/2021)   Humiliation, Afraid, Rape, and Kick questionnaire    Fear of Current or Ex-Partner: No    Emotionally Abused: No    Physically Abused: No    Sexually Abused: No    Past Surgical History:  Procedure Laterality Date   COLONOSCOPY WITH PROPOFOL N/A 06/25/2017   Procedure: COLONOSCOPY WITH PROPOFOL;  Surgeon: Daneil Dolin, MD;  [facility];  Service: Endoscopy;  Laterality: N/A;  2:00pm   MULTIPLE EXTRACTIONS WITH ALVEOLOPLASTY N/A 03/22/2016   Procedure: MULTIPLE EXTRACTION WITH ALVEOLOPLASTY;  Surgeon: Diona Browner, DDS;  Location: [place];  Service: Oral Surgery;  Laterality: N/A;   NO PAST SURGERIES     POLYPECTOMY  06/25/2017   Procedure: POLYPECTOMY;  Surgeon: Daneil Dolin, MD;  [facility];  Service: Endoscopy;;  colon      Current Outpatient Medications:    albuterol (VENTOLIN HFA) 108 (90 Base) MCG/ACT inhaler, INHALE 1 OR 2 PUFFS BY MOUTH EVERY 6 HOURS AS NEEDED FOR WHEEZING OR SHORTNESS OF BREATH, Disp: 24 g, Rfl: 3   amLODipine (NORVASC) 5 MG tablet, TAKE 1 TABLET BY MOUTH DAILY, Disp: 30 tablet, Rfl: 10   FARXIGA 10 MG TABS tablet, Take 10 mg by mouth daily., Disp: , Rfl:    fluticasone (FLONASE) 50 MCG/ACT nasal spray, INSTILL 2 SPRAYS IN BOTH NOSTRILS TWICE DAILY, Disp: 48 mL, Rfl:  3   Fluticasone-Umeclidin-Vilant (TRELEGY ELLIPTA) 200-62.5-25 MCG/ACT AEPB, Inhale 1 puff into the lungs daily., Disp: 60 each, Rfl: 2   gabapentin (NEURONTIN) 100 MG capsule, Take 1 capsule (100 mg total) by mouth at bedtime., Disp: 90 capsule, Rfl: 1   ofloxacin (FLOXIN OTIC) 0.3 % OTIC solution, Place 5 drops into the left ear daily., Disp: 5 mL, Rfl: 0   OXYGEN, Inhale 3 L into the lungs daily. continuous, Disp: ,  Rfl:    OZEMPIC, 0.25 OR 0.5 MG/DOSE, 2 MG/3ML SOPN, SMARTSIG:0.25 Milligram(s) SUB-Q Once a Week, Disp: , Rfl:    roflumilast (DALIRESP) 500 MCG TABS tablet, Take 1 tablet (500 mcg total) by mouth daily., Disp: 30 tablet, Rfl: 5   sertraline (ZOLOFT) 25 MG tablet, Take 1 tablet (25 mg total) by mouth daily., Disp: 90 tablet, Rfl: 1   furosemide (LASIX) 20 MG tablet, Take 20 mg by mouth daily. (Patient not taking: Reported on 08/12/2022), Disp: , Rfl:    ketoconazole (NIZORAL) 2 % cream, Apply 1 Application topically daily. (Patient not taking: Reported on 08/12/2022), Disp: 15 g, Rfl: 0   levocetirizine (XYZAL) 5 MG tablet, Take 1 tablet (5 mg total) by mouth every evening. (Patient not taking: Reported on 08/12/2022), Disp: 30 tablet, Rfl: 0    Physical Exam: Blood pressure 108/64, pulse 80, height '5\' 8"'$  (1.727 m), weight 240 lb (108.9 kg), SpO2 96 %.    Affect appropriate Chronically ill COPDer HEENT: normal Neck supple with no adenopathy JVP normal no bruits no thyromegaly Lungs poor breath sounds with exp wheezing and hyper inflation  Heart:  S1/S2 no murmur, no rub, gallop or click PMI normal Abdomen: benighn, BS positve, no tenderness, no AAA no bruit.  No HSM or HJR Distal pulses intact with no bruits No edema Neuro non-focal Skin warm and dry No muscular weakness   Labs:   Lab Results  Component Value Date   WBC 8.7 05/16/2022   HGB 16.4 05/16/2022   HCT 49.4 05/16/2022   MCV 91 05/16/2022   PLT 210 05/16/2022   No results for input(s): "NA", "K", "CL",  "CO2", "BUN", "CREATININE", "CALCIUM", "PROT", "BILITOT", "ALKPHOS", "ALT", "AST", "GLUCOSE" in the last 168 hours.  Invalid input(s): "LABALBU" Lab Results  Component Value Date   TROPONINI <0.03 09/24/2018    Lab Results  Component Value Date   CHOL 156 05/16/2022   CHOL 157 10/25/2020   CHOL 122 07/22/2019   Lab Results  Component Value Date   HDL 51 05/16/2022   HDL 56 10/25/2020   HDL 40 07/22/2019   Lab Results  Component Value Date   LDLCALC 94 05/16/2022   LDLCALC 86 10/25/2020   LDLCALC 68 07/22/2019   Lab Results  Component Value Date   TRIG 52 05/16/2022   TRIG 64 10/25/2020   TRIG 67 07/22/2019   Lab Results  Component Value Date   CHOLHDL 3.1 05/16/2022   CHOLHDL 2.8 10/25/2020   CHOLHDL 3.1 07/22/2019   No results found for: "LDLDIRECT"    Radiology: No results found.  EKG: SR RBBB PR 214 msec rate 80 10/923    ASSESSMENT AND PLAN:   Pulmonary HTN:  Who class 3 from COPD no indication for vasodilators f/u pulmonary update echo but 2019 TTE had RV dilatation and D shaped septum BNP He has been seen at [facility] for consideration of endobronchial lung volume reduction BNP normal 07/03/22 No indication for [place] to start any medication F/U at [facility]  COPD:  on 3 L's oxygen inhalers Albuterol/Treligy f/u pulmonary and [facility]  HTN:  continue norvasc and lasix    F/U in a year  Signed: [name] 08/12/2022, 10:58 AM

## 2022-08-05 DIAGNOSIS — J449 Chronic obstructive pulmonary disease, unspecified: Secondary | ICD-10-CM | POA: Diagnosis not present

## 2022-08-06 ENCOUNTER — Other Ambulatory Visit: Payer: Self-pay | Admitting: Internal Medicine

## 2022-08-06 ENCOUNTER — Telehealth: Payer: Self-pay | Admitting: Internal Medicine

## 2022-08-06 DIAGNOSIS — H6502 Acute serous otitis media, left ear: Secondary | ICD-10-CM

## 2022-08-06 MED ORDER — OFLOXACIN 0.3 % OT SOLN: 5.0000 [drp] | Freq: Every day | OTIC | 0 refills | Status: DC

## 2022-08-06 NOTE — Telephone Encounter (Signed)
Pt called stating has been using the drops for the ear infection. States it is draining. He is using drops before bed. States if he sleep on that side it drains and he is able to hear, but if he sleep on the opposite side its stopped up the next day. Wants nurse to give him a call to explain more and to see what he needs to do.

## 2022-08-12 ENCOUNTER — Ambulatory Visit (HOSPITAL_COMMUNITY): Payer: Medicare Other | Attending: Cardiovascular Disease | Admitting: Cardiovascular Disease

## 2022-08-12 ENCOUNTER — Telehealth: Payer: Self-pay | Admitting: Internal Medicine

## 2022-08-12 ENCOUNTER — Encounter: Payer: Self-pay | Admitting: Cardiovascular Disease

## 2022-08-12 VITALS — BP 108/64 | HR 80 | Ht 68.0 in | Wt 240.0 lb

## 2022-08-12 DIAGNOSIS — I2729 Other secondary pulmonary hypertension: Secondary | ICD-10-CM

## 2022-08-12 DIAGNOSIS — I50810 Right heart failure, unspecified: Secondary | ICD-10-CM

## 2022-08-12 DIAGNOSIS — I10 Essential (primary) hypertension: Secondary | ICD-10-CM

## 2022-08-12 DIAGNOSIS — J449 Chronic obstructive pulmonary disease, unspecified: Secondary | ICD-10-CM | POA: Diagnosis not present

## 2022-08-12 DIAGNOSIS — G4733 Obstructive sleep apnea (adult) (pediatric): Secondary | ICD-10-CM | POA: Diagnosis not present

## 2022-08-12 NOTE — Addendum Note (Signed)
Addended by: Christella Scheuermann C on: 08/12/2022 01:05 PM   Modules accepted: Orders

## 2022-08-12 NOTE — Patient Instructions (Signed)
Medication Instructions:  Your physician recommends that you continue on your current medications as directed. Please refer to the Current Medication list given to you today.   Labwork: None today  Testing/Procedures: None today  Follow-Up: 1 year with Dr.Nishan  Any Other Special Instructions Will Be Listed Below (If Applicable).  If you need a refill on your cardiac medications before your next appointment, please call your pharmacy.

## 2022-08-12 NOTE — Telephone Encounter (Signed)
Pt called stating he is able to hear out of his ear but it is still draining. Wants to know what he needs to do?   [employer]

## 2022-08-15 ENCOUNTER — Ambulatory Visit (INDEPENDENT_AMBULATORY_CARE_PROVIDER_SITE_OTHER): Payer: Medicare Other | Admitting: Internal Medicine

## 2022-08-15 ENCOUNTER — Encounter: Payer: Self-pay | Admitting: Internal Medicine

## 2022-08-15 VITALS — BP 134/82 | HR 84 | Resp 16 | Ht 67.0 in | Wt 240.8 lb

## 2022-08-15 DIAGNOSIS — H6505 Acute serous otitis media, recurrent, left ear: Secondary | ICD-10-CM | POA: Diagnosis not present

## 2022-08-15 MED ORDER — AMOXICILLIN-POT CLAVULANATE 875-125 MG PO TABS: 1.0000 | ORAL_TABLET | Freq: Two times a day (BID) | ORAL | 0 refills | Status: DC

## 2022-08-15 NOTE — Progress Notes (Signed)
Acute Office Visit  Subjective:    Patient ID: Ricardo Burch, male    DOB: 11-16-1960, 61 y.o.   MRN: [id number]  Chief Complaint  Patient presents with   Ear Drainage    Pt has ear drainage since 07-22-22 it will be clear during the day stops up at night if he messes with his ear it starts draining     HPI Patient is in today for complain of persistent left ear pain and whitish discharge, which started about a month ago.  He was given ofloxacin eardrops, which had improved his symptoms initially, but have recurred now.  He denies any fever, chills, nasal congestion, postnasal drip, sore throat, but does feel ear fullness and hearing difficulty on the left side.  Denies any gait imbalance.  Denies any bleeding from the ear.  Past Medical History:  Diagnosis Date   Allergy    CHF (congestive heart failure) (HCC)    Chronic kidney disease    COPD (chronic obstructive pulmonary disease) ([place]) Dx 2015   Depression    Eczema    since childhood    Erectile dysfunction 08/18/2019   GERD (gastroesophageal reflux disease) 01/25/2020   Hyperlipidemia 01/12/2018   Hypertension    Oxygen deficiency    Screening for HIV (human immunodeficiency virus)    Sleep apnea    CPAP   Substance abuse ([place])    MJ, cocaine   Testicular failure 07/22/2019    Past Surgical History:  Procedure Laterality Date   COLONOSCOPY WITH PROPOFOL N/A 06/25/2017   Procedure: COLONOSCOPY WITH PROPOFOL;  Surgeon: Daneil Dolin, MD;  [facility];  Service: Endoscopy;  Laterality: N/A;  2:00pm   MULTIPLE EXTRACTIONS WITH ALVEOLOPLASTY N/A 03/22/2016   Procedure: MULTIPLE EXTRACTION WITH ALVEOLOPLASTY;  Surgeon: Diona Browner, DDS;  Location: [place];  Service: Oral Surgery;  Laterality: N/A;   NO PAST SURGERIES     POLYPECTOMY  06/25/2017   Procedure: POLYPECTOMY;  Surgeon: Daneil Dolin, MD;  [facility];  Service: Endoscopy;;  colon    Family History  Problem Relation Age of Onset    Arthritis Mother    Hypertension Mother    Stroke Mother        age 29   Cerebral aneurysm Father    Alcohol abuse Father    Cancer Brother    Diabetes Neg Hx    Heart disease Neg Hx     Social History   Socioeconomic History   Marital status: Single    Spouse name: Not on file   Number of children: 5   Years of education: 12   Highest education level: Not on file  Occupational History   Occupation: Unemployed     Comment: car wash details  Tobacco Use   Smoking status: Former    Packs/day: 1.00    Years: 38.00    Total pack years: 38.00    Types: Cigarettes    Quit date: 02/21/2020    Years since quitting: 2.4   Smokeless tobacco: Never  Vaping Use   Vaping Use: Never used  Substance and Sexual Activity   Alcohol use: Yes    Alcohol/week: 1.0 standard drink of alcohol    Types: 1 Cans of beer per week    Comment: Socially x 1/month currently (as of 01/25/20); previously: occassionally: weekends, 6-pack or less on a day; or half pint of liquior   Drug use: Yes    Types: Marijuana, Cocaine    Comment:  01/25/20-marijuana few weeks ago, no cocaine   Sexual activity: Never  Other Topics Concern   Not on file  Social History Narrative    Lives alone.    5 daughters 62, 54 yo twins, eldest 2 are married live in [place]. Retired.Girlfriend works in Armed forces logistics/support/administrative officer.   Social Determinants of Health   Financial Resource Strain: Low Risk  (12/31/2021)   Overall Financial Resource Strain (CARDIA)    Difficulty of Paying Living Expenses: Not very hard  Food Insecurity: No Food Insecurity (07/31/2022)   Hunger Vital Sign    Worried About Running Out of Food in the Last Year: Never true    Ran Out of Food in the Last Year: Never true  Transportation Needs: No Transportation Needs (07/31/2022)   PRAPARE - Hydrologist (Medical): No    Lack of Transportation (Non-Medical): No  Physical Activity: Insufficiently Active (02/11/2022)   Exercise Vital Sign     Days of Exercise per Week: 1 day    Minutes of Exercise per Session: 10 min  Stress: Stress Concern Present (02/11/2022)   [place]    Feeling of Stress : To some extent  Social Connections: Moderately Integrated (12/31/2021)   Social Connection and Isolation Panel [NHANES]    Frequency of Communication with Friends and Family: More than three times a week    Frequency of Social Gatherings with Friends and Family: Three times a week    Attends Religious Services: More than 4 times per year    Active Member of Clubs or Organizations: No    Attends Archivist Meetings: Never    Marital Status: Living with partner  Intimate Partner Violence: Not At Risk (12/31/2021)   Humiliation, Afraid, Rape, and Kick questionnaire    Fear of Current or Ex-Partner: No    Emotionally Abused: No    Physically Abused: No    Sexually Abused: No    Outpatient Medications Prior to Visit  Medication Sig Dispense Refill   albuterol (VENTOLIN HFA) 108 (90 Base) MCG/ACT inhaler INHALE 1 OR 2 PUFFS BY MOUTH EVERY 6 HOURS AS NEEDED FOR WHEEZING OR SHORTNESS OF BREATH 24 g 3   amLODipine (NORVASC) 5 MG tablet TAKE 1 TABLET BY MOUTH DAILY 30 tablet 10   FARXIGA 10 MG TABS tablet Take 10 mg by mouth daily.     fluticasone (FLONASE) 50 MCG/ACT nasal spray INSTILL 2 SPRAYS IN BOTH NOSTRILS TWICE DAILY 48 mL 3   Fluticasone-Umeclidin-Vilant (TRELEGY ELLIPTA) 200-62.5-25 MCG/ACT AEPB Inhale 1 puff into the lungs daily. 60 each 2   furosemide (LASIX) 20 MG tablet Take 20 mg by mouth daily.     gabapentin (NEURONTIN) 100 MG capsule Take 1 capsule (100 mg total) by mouth at bedtime. 90 capsule 1   ketoconazole (NIZORAL) 2 % cream Apply 1 Application topically daily. 15 g 0   levocetirizine (XYZAL) 5 MG tablet Take 1 tablet (5 mg total) by mouth every evening. 30 tablet 0   ofloxacin (FLOXIN OTIC) 0.3 % OTIC solution Place 5 drops into the left  ear daily. 5 mL 0   OXYGEN Inhale 3 L into the lungs daily. continuous     OZEMPIC, 0.25 OR 0.5 MG/DOSE, 2 MG/3ML SOPN SMARTSIG:0.25 Milligram(s) SUB-Q Once a Week     roflumilast (DALIRESP) 500 MCG TABS tablet Take 1 tablet (500 mcg total) by mouth daily. 30 tablet 5   sertraline (ZOLOFT) 25 MG tablet Take  1 tablet (25 mg total) by mouth daily. 90 tablet 1   No facility-administered medications prior to visit.    Allergies  Allergen Reactions   Apresoline [Hydralazine] Other (See Comments)    Headache     Review of Systems  Constitutional:  Positive for fatigue. Negative for chills and fever.  HENT:  Positive for ear discharge and ear pain. Negative for congestion and sore throat.        L ear fullness  Eyes:  Negative for pain and discharge.  Respiratory:  Positive for shortness of breath. Negative for cough.   Cardiovascular:  Negative for chest pain and palpitations.  Gastrointestinal:  Negative for diarrhea, nausea and vomiting.  Endocrine: Negative for polydipsia and polyuria.  Genitourinary:  Negative for dysuria and hematuria.  Musculoskeletal:  Positive for back pain. Negative for neck pain and neck stiffness.  Skin:  Negative for rash.  Neurological:  Negative for dizziness, weakness, numbness and headaches.  Psychiatric/Behavioral:  Positive for dysphoric mood. Negative for agitation and behavioral problems.        Objective:    Physical Exam Vitals reviewed.  Constitutional:      General: He is not in acute distress.    Appearance: He is not diaphoretic.  HENT:     Head: Normocephalic and atraumatic.     Right Ear: External ear normal. There is no impacted cerumen.     Left Ear: Tenderness present. A middle ear effusion is present. There is no impacted cerumen. Tympanic membrane is injected.     Nose: Nose normal.     Mouth/Throat:     Mouth: Mucous membranes are moist.  Eyes:     General: No scleral icterus.    Extraocular Movements: Extraocular movements  intact.  Cardiovascular:     Rate and Rhythm: Normal rate and regular rhythm.     Pulses: Normal pulses.     Heart sounds: Normal heart sounds. No murmur heard. Pulmonary:     Breath sounds: Normal breath sounds. No wheezing or rales.     Comments: On home O2 - 3 lpm Musculoskeletal:     Cervical back: Neck supple. No tenderness.     Right lower leg: No edema.     Left lower leg: No edema.  Skin:    General: Skin is warm.     Findings: No rash.  Neurological:     General: No focal deficit present.     Mental Status: He is alert and oriented to person, place, and time.     Sensory: No sensory deficit.     Motor: No weakness.  Psychiatric:        Mood and Affect: Mood normal.        Behavior: Behavior normal.     BP 134/82 (BP Location: Right Arm, Patient Position: Sitting, Cuff Size: Normal)   Pulse 84   Resp 16   Ht '5\' 7"'$  (1.702 m)   Wt 240 lb 12.8 oz (109.2 kg)   SpO2 91%   BMI 37.71 kg/m  Wt Readings from Last 3 Encounters:  08/15/22 240 lb 12.8 oz (109.2 kg)  08/12/22 240 lb (108.9 kg)  07/22/22 243 lb (110.2 kg)        Assessment & Plan:   Problem List Items Addressed This Visit       Nervous and Auditory   Recurrent acute serous otitis media of left ear - Primary    Left ear pain and discharge, recurrent Has completed ofloxacin otic drops Started  empiric Augmentin Has seen Dr. Benjamine Mola in the past, advised to contact for evaluation      Relevant Medications   amoxicillin-clavulanate (AUGMENTIN) 875-125 MG tablet     Meds ordered this encounter  Medications   amoxicillin-clavulanate (AUGMENTIN) 875-125 MG tablet    Sig: Take 1 tablet by mouth 2 (two) times daily.    Dispense:  14 tablet    Refill:  0     Ziza Hastings Keith Rake, MD

## 2022-08-15 NOTE — Patient Instructions (Addendum)
Please start taking Augmentin as prescribed.  Please avoid using any sharp objects for cleaning purposes.  Please contact Dr Deeann Saint office for ear evaluation.

## 2022-08-15 NOTE — Assessment & Plan Note (Signed)
Left ear pain and discharge, recurrent Has completed ofloxacin otic drops Started empiric Augmentin Has seen Dr. Benjamine Mola in the past, advised to contact for evaluation

## 2022-08-21 ENCOUNTER — Ambulatory Visit (HOSPITAL_COMMUNITY)
Admission: RE | Admit: 2022-08-21 | Discharge: 2022-08-21 | Disposition: A | Discharge status: Home / Self Care | Payer: Medicare Other | Source: Ambulatory Visit | Attending: Acute Care | Admitting: Acute Care

## 2022-08-21 DIAGNOSIS — Z87891 Personal history of nicotine dependence: Secondary | ICD-10-CM

## 2022-08-21 DIAGNOSIS — I2510 Atherosclerotic heart disease of native coronary artery without angina pectoris: Secondary | ICD-10-CM | POA: Insufficient documentation

## 2022-08-21 DIAGNOSIS — I700 Atherosclerosis of aorta: Secondary | ICD-10-CM | POA: Diagnosis not present

## 2022-08-21 DIAGNOSIS — J439 Emphysema, unspecified: Secondary | ICD-10-CM | POA: Insufficient documentation

## 2022-08-21 DIAGNOSIS — Z122 Encounter for screening for malignant neoplasm of respiratory organs: Secondary | ICD-10-CM | POA: Diagnosis not present

## 2022-08-21 DIAGNOSIS — F17210 Nicotine dependence, cigarettes, uncomplicated: Secondary | ICD-10-CM | POA: Insufficient documentation

## 2022-08-24 NOTE — Therapy (Incomplete)
OUTPATIENT PHYSICAL THERAPY THORACOLUMBAR EVALUATION   Patient Name: Ricardo Burch MRN: [id number] DOB:[birth year], 61 y.o., male Today's Date: 08/24/2022    Past Medical History:  Diagnosis Date   Allergy    CHF (congestive heart failure) ([place])    Chronic kidney disease    COPD (chronic obstructive pulmonary disease) ([place]) Dx 2015   Depression    Eczema    since childhood    Erectile dysfunction 08/18/2019   GERD (gastroesophageal reflux disease) 01/25/2020   Hyperlipidemia 01/12/2018   Hypertension    Oxygen deficiency    Screening for HIV (human immunodeficiency virus)    Sleep apnea    CPAP   Substance abuse ([place])    MJ, cocaine   Testicular failure 07/22/2019   Past Surgical History:  Procedure Laterality Date   COLONOSCOPY WITH PROPOFOL N/A 06/25/2017   Procedure: COLONOSCOPY WITH PROPOFOL;  Surgeon: Daneil Dolin, MD;  [facility];  Service: Endoscopy;  Laterality: N/A;  2:00pm   MULTIPLE EXTRACTIONS WITH ALVEOLOPLASTY N/A 03/22/2016   Procedure: MULTIPLE EXTRACTION WITH ALVEOLOPLASTY;  Surgeon: Diona Browner, DDS;  Location: [facility];  Service: Oral Surgery;  Laterality: N/A;   NO PAST SURGERIES     POLYPECTOMY  06/25/2017   Procedure: POLYPECTOMY;  Surgeon: Daneil Dolin, MD;  [facility];  Service: Endoscopy;;  colon   Patient Active Problem List   Diagnosis Date Noted   Recurrent acute serous otitis media of left ear 08/15/2022   Excessive ear wax, left 07/22/2022   Need for immunization against influenza 07/09/2022   Encounter for general adult medical examination with abnormal findings 03/05/2022   Chronic fatigue 11/28/2021   DDD (degenerative disc disease), lumbar 11/28/2021   Lung nodule 08/30/2021   Pulmonary hypertension, moderate to severe ([place]) 08/28/2021   GERD (gastroesophageal reflux disease) 01/25/2020   Bloating 01/25/2020   Morbid obesity ([place]) 12/06/2019   Erectile dysfunction 08/18/2019   Chronic diastolic CHF  (congestive heart failure) ([place]) 09/23/2018   Hyperlipidemia 01/12/2018   Hypertension 10/14/2017   Aortic atherosclerosis ([place]) 09/15/2017   Pre-diabetes 09/15/2017   Cocaine abuse ([place]) 07/26/2016   MDD (major depressive disorder), recurrent, in partial remission ([place]) 07/26/2016   OSA on CPAP 07/26/2016   Alcohol abuse 06/27/2016   COPD (chronic obstructive pulmonary disease) ([place]) 08/23/2014   Eczema 08/23/2014   Right knee pain 08/23/2014   Tobacco abuse 03/27/2014   Chronic respiratory failure with hypoxia ([place]) 03/27/2014    PCP: ***  REFERRING PROVIDER: ***  REFERRING DIAG: ***  Rationale for Evaluation and Treatment {HABREHAB:27488}  THERAPY DIAG:  No diagnosis found.  ONSET DATE: ***  SUBJECTIVE:  SUBJECTIVE STATEMENT: ***  PERTINENT HISTORY:  ***  PAIN:  Are you having pain? {OPRCPAIN:27236}  PRECAUTIONS: {Therapy precautions:24002}  WEIGHT BEARING RESTRICTIONS: {Yes ***/No:24003}  FALLS:  Has patient fallen in last 6 months? {fallsyesno:27318}  LIVING ENVIRONMENT: Lives with: {OPRC lives with:25569::"lives with their family"} Lives in: {Lives in:25570} Stairs: {opstairs:27293} Has following equipment at home: {Assistive devices:23999}  OCCUPATION: ***  PLOF: {PLOF:24004}  PATIENT GOALS: ***   OBJECTIVE:   DIAGNOSTIC FINDINGS:  ***  PATIENT SURVEYS:  {rehab surveys:24030}  SCREENING FOR RED FLAGS: Bowel or bladder incontinence: {Yes/No:304960894} Spinal tumors: {Yes/No:304960894} Cauda equina syndrome: {Yes/No:304960894} Compression fracture: {Yes/No:304960894} Abdominal aneurysm: {Yes/No:304960894}  COGNITION: Overall cognitive status: {cognition:24006}     SENSATION: {sensation:27233}  MUSCLE LENGTH: Hamstrings: Right *** deg; Left ***  deg Thomas test: Right *** deg; Left *** deg  POSTURE: {posture:25561}  PALPATION: ***  LUMBAR ROM:   AROM eval  Flexion   Extension   Right lateral flexion   Left lateral flexion   Right rotation   Left rotation    (Blank rows = not tested)  LOWER EXTREMITY ROM:     {AROM/PROM:27142}  Right eval Left eval  Hip flexion    Hip extension    Hip abduction    Hip adduction    Hip internal rotation    Hip external rotation    Knee flexion    Knee extension    Ankle dorsiflexion    Ankle plantarflexion    Ankle inversion    Ankle eversion     (Blank rows = not tested)  LOWER EXTREMITY MMT:    MMT Right eval Left eval  Hip flexion    Hip extension    Hip abduction    Hip adduction    Hip internal rotation    Hip external rotation    Knee flexion    Knee extension    Ankle dorsiflexion    Ankle plantarflexion    Ankle inversion    Ankle eversion     (Blank rows = not tested)  LUMBAR SPECIAL TESTS:  {lumbar special test:25242}  FUNCTIONAL TESTS:  {Functional tests:24029}  GAIT: Distance walked: *** Assistive device utilized: {Assistive devices:23999} Level of assistance: {Levels of assistance:24026} Comments: ***  TODAY'S TREATMENT:                                                                                                                              DATE: ***    PATIENT EDUCATION:  Education details: *** Person educated: {Person educated:25204} Education method: {Education Method:25205} Education comprehension: {Education Comprehension:25206}  HOME EXERCISE PROGRAM: ***  ASSESSMENT:  CLINICAL IMPRESSION: Patient is a *** y.o. *** who was seen today for physical therapy evaluation and treatment for ***.   OBJECTIVE IMPAIRMENTS: {opptimpairments:25111}.   ACTIVITY LIMITATIONS: {activitylimitations:27494}  PARTICIPATION LIMITATIONS: {participationrestrictions:25113}  PERSONAL FACTORS: {Personal factors:25162} are also affecting  patient's functional outcome.   REHAB POTENTIAL: {rehabpotential:25112}  CLINICAL DECISION MAKING: {clinical decision making:25114}  EVALUATION COMPLEXITY: {  Evaluation complexity:25115}   GOALS: Goals reviewed with patient? {yes/no:20286}  SHORT TERM GOALS: Target date: {follow up:25551}  *** Baseline: Goal status: {GOALSTATUS:25110}  2.  *** Baseline:  Goal status: {GOALSTATUS:25110}  3.  *** Baseline:  Goal status: {GOALSTATUS:25110}  4.  *** Baseline:  Goal status: {GOALSTATUS:25110}  5.  *** Baseline:  Goal status: {GOALSTATUS:25110}  6.  *** Baseline:  Goal status: {GOALSTATUS:25110}  LONG TERM GOALS: Target date: {follow up:25551}  *** Baseline:  Goal status: {GOALSTATUS:25110}  2.  *** Baseline:  Goal status: {GOALSTATUS:25110}  3.  *** Baseline:  Goal status: {GOALSTATUS:25110}  4.  *** Baseline:  Goal status: {GOALSTATUS:25110}  5.  *** Baseline:  Goal status: {GOALSTATUS:25110}  6.  *** Baseline:  Goal status: {GOALSTATUS:25110}  PLAN:  PT FREQUENCY: {rehab frequency:25116}  PT DURATION: {rehab duration:25117}  PLANNED INTERVENTIONS: {rehab planned interventions:25118::"Therapeutic exercises","Therapeutic activity","Neuromuscular re-education","Balance training","Gait training","Patient/Family education","Self Care","Joint mobilization"}.  PLAN FOR NEXT SESSION: ***   Ardis Rowan, PT 08/24/2022, 11:22 AM

## 2022-08-26 ENCOUNTER — Encounter (HOSPITAL_COMMUNITY): Payer: Self-pay

## 2022-08-26 ENCOUNTER — Other Ambulatory Visit: Payer: Self-pay | Admitting: Pulmonary Disease

## 2022-08-26 ENCOUNTER — Other Ambulatory Visit: Payer: Self-pay

## 2022-08-26 ENCOUNTER — Emergency Department (HOSPITAL_COMMUNITY)
Admission: EM | Admit: 2022-08-26 | Discharge: 2022-08-26 | Disposition: A | Discharge status: Home / Self Care | Payer: Medicare Other | Attending: Emergency Medicine | Admitting: Emergency Medicine

## 2022-08-26 ENCOUNTER — Ambulatory Visit (HOSPITAL_COMMUNITY): Payer: Medicare Other

## 2022-08-26 DIAGNOSIS — F17210 Nicotine dependence, cigarettes, uncomplicated: Secondary | ICD-10-CM

## 2022-08-26 DIAGNOSIS — H60502 Unspecified acute noninfective otitis externa, left ear: Secondary | ICD-10-CM | POA: Diagnosis not present

## 2022-08-26 DIAGNOSIS — H6092 Unspecified otitis externa, left ear: Secondary | ICD-10-CM | POA: Diagnosis not present

## 2022-08-26 DIAGNOSIS — Z87891 Personal history of nicotine dependence: Secondary | ICD-10-CM

## 2022-08-26 DIAGNOSIS — Z122 Encounter for screening for malignant neoplasm of respiratory organs: Secondary | ICD-10-CM

## 2022-08-26 DIAGNOSIS — H9202 Otalgia, left ear: Secondary | ICD-10-CM | POA: Diagnosis present

## 2022-08-26 MED ORDER — CIPROFLOXACIN-DEXAMETHASONE 0.3-0.1 % OT SUSP
2.0000 [drp] | Freq: Two times a day (BID) | OTIC | Status: DC
  Administered 2022-08-26: 2 [drp] via OTIC
  Filled 2022-08-26: qty 7.5

## 2022-08-26 NOTE — ED Provider Notes (Signed)
[facility] EMERGENCY DEPARTMENT Provider Note   CSN: [id number] Arrival date & time: 08/26/22  5638     History  Chief Complaint  Patient presents with   Otalgia    Ricardo Burch is a 61 y.o. male.  Patient is a 61 yo male presenting for ear complaint. Pt admits to itching and drainage from left ear. States he was dx with an ear infection by his pcp 2 weeks ago and sent home with ear drops. States he started having itching and drainage and returned to his pcp who then sent him home with oral antibiotics. Pt denies ear pain. Admits to decreased hearing in the left hear. Denies any swelling, redness, or bleeding. Denies fevers or chills. Has outpatient appointment with ENT Dr. Benjamine Mola scheduled for Nov 1st.   The history is provided by the patient. No language interpreter was used.  Otalgia Associated symptoms: no abdominal pain, no cough, no fever, no rash, no sore throat and no vomiting        Home Medications Prior to Admission medications   Medication Sig Start Date End Date Taking? Authorizing Provider  albuterol (VENTOLIN HFA) 108 (90 Base) MCG/ACT inhaler INHALE 1 OR 2 PUFFS BY MOUTH EVERY 6 HOURS AS NEEDED FOR WHEEZING OR SHORTNESS OF BREATH 06/18/22   Mannam, Praveen, MD  amLODipine (NORVASC) 5 MG tablet TAKE 1 TABLET BY MOUTH DAILY 05/01/22   [name], MD  amoxicillin-clavulanate (AUGMENTIN) 875-125 MG tablet Take 1 tablet by mouth 2 (two) times daily. 08/15/22   Patel, Colin Broach, MD  FARXIGA 10 MG TABS tablet Take 10 mg by mouth daily. 02/25/22   [provider]  fluticasone (FLONASE) 50 MCG/ACT nasal spray INSTILL 2 SPRAYS IN BOTH NOSTRILS TWICE DAILY 06/18/22   Mannam, Hart Robinsons, MD  Fluticasone-Umeclidin-Vilant (TRELEGY ELLIPTA) 200-62.5-25 MCG/ACT AEPB INHALE ONE (1) PUFF BY MOUTH DAILY 08/27/22   Mannam, Praveen, MD  furosemide (LASIX) 20 MG tablet Take 20 mg by mouth daily. 12/25/21   [provider]  gabapentin (NEURONTIN) 100 MG capsule Take 1 capsule  (100 mg total) by mouth at bedtime. 07/09/22   [name], MD  ketoconazole (NIZORAL) 2 % cream Apply 1 Application topically daily. 05/28/22   [name], MD  levocetirizine (XYZAL) 5 MG tablet Take 1 tablet (5 mg total) by mouth every evening. 07/19/22   Alvira Monday, FNP  ofloxacin (FLOXIN OTIC) 0.3 % OTIC solution Place 5 drops into the left ear daily. 08/06/22   [name], MD  OXYGEN Inhale 3 L into the lungs daily. continuous    [provider]  OZEMPIC, 0.25 OR 0.5 MG/DOSE, 2 MG/3ML SOPN SMARTSIG:0.25 Milligram(s) SUB-Q Once a Week 02/25/22   [provider]  roflumilast (DALIRESP) 500 MCG TABS tablet Take 1 tablet (500 mcg total) by mouth daily. 05/22/22   Mannam, Hart Robinsons, MD  sertraline (ZOLOFT) 25 MG tablet Take 1 tablet (25 mg total) by mouth daily. 03/05/22   [name], MD      Allergies    Apresoline [hydralazine]    Review of Systems   Review of Systems  Constitutional:  Negative for chills and fever.  HENT:  Negative for ear pain and sore throat.   Eyes:  Negative for pain and visual disturbance.  Respiratory:  Negative for cough and shortness of breath.   Cardiovascular:  Negative for chest pain and palpitations.  Gastrointestinal:  Negative for abdominal pain and vomiting.  Genitourinary:  Negative for dysuria and hematuria.  Musculoskeletal:  Negative for arthralgias and back pain.  Skin:  Negative for color change and rash.  Neurological:  Negative for seizures and syncope.  All other systems reviewed and are negative.   Physical Exam Updated Vital Signs BP (!) 130/117 (BP Location: Right Arm)   Pulse 70   Temp 98.2 F (36.8 C) (Oral)   Resp 18   Ht '5\' 8"'$  (1.727 m)   Wt 108.9 kg   SpO2 100%   BMI 36.49 kg/m  Physical Exam Vitals and nursing note reviewed.  Constitutional:      Appearance: Normal appearance.  HENT:     Head: Normocephalic and atraumatic.     Right Ear: Hearing, tympanic membrane and ear canal normal.      Left Ear: Decreased hearing noted. Drainage present. No tenderness.  Eyes:     Conjunctiva/sclera: Conjunctivae normal.  Cardiovascular:     Rate and Rhythm: Normal rate and regular rhythm.  Pulmonary:     Effort: Pulmonary effort is normal.     Breath sounds: Normal breath sounds.  Neurological:     Mental Status: He is alert.     ED Results / Procedures / Treatments   Labs (all labs ordered are listed, but only abnormal results are displayed) Labs Reviewed - No data to display  EKG None  Radiology No results found.  Procedures Procedures    Medications Ordered in ED Medications - No data to display  ED Course/ Medical Decision Making/ A&P                           Medical Decision Making  61 year old man complaining for left sided ear pain after outpatient treatment with eardrops of unknown name and oral antibiotics.  Patient is alert and oriented x3 on exam, no acute distress, afebrile, stable vital signs.  Physical exam demonstrates drainage from the left ear and decreased hearing.  Unable to see TM secondary to scar tissue and drainage.  I spoke with on-call otolaryngologist Dr. Benjamine Mola who recommended Ciprodex and f/u in his office on Nov 1st.  Patient agreeable to plan.  No other concerning signs or symptoms on physical exam.  Signs or symptoms of sepsis.  Patient in no distress and overall condition improved here in the ED. Detailed discussions were had with the patient regarding current findings, and need for close f/u with PCP or on call doctor. The patient has been instructed to return immediately if the symptoms worsen in any way for re-evaluation. Patient verbalized understanding and is in agreement with current care plan. All questions answered prior to discharge.        Final Clinical Impression(s) / ED Diagnoses Final diagnoses:  Acute otitis externa of left ear, unspecified type    Rx / DC Orders ED Discharge Orders     None         Lianne Cure, DO 51/02/58 [id number]

## 2022-08-26 NOTE — ED Notes (Signed)
Pt d/c home per MD order. Discharge summary reviewed, pt verbalizes understanding. Ambulatory off unit. No s/s of acute distress noted at discharge.  °

## 2022-08-26 NOTE — Discharge Instructions (Addendum)
Use antibiotic/steroid combination drops twice a day for 7 days.   Keep your follow up appointment with Dr. Benjamine Mola for Nov 1st. Call office sooner if you develop fevers.

## 2022-08-26 NOTE — ED Triage Notes (Signed)
Patient states went to his PCP and got his left ear cleaned out and states itching on the outside of his ear. Patient given antibiotic drops and states that did not work. Pills made th ear drain with crusting. Patient states the itching is the worst with headache.

## 2022-08-27 DIAGNOSIS — I272 Pulmonary hypertension, unspecified: Secondary | ICD-10-CM | POA: Diagnosis not present

## 2022-08-27 DIAGNOSIS — G4733 Obstructive sleep apnea (adult) (pediatric): Secondary | ICD-10-CM | POA: Diagnosis not present

## 2022-08-27 DIAGNOSIS — J449 Chronic obstructive pulmonary disease, unspecified: Secondary | ICD-10-CM | POA: Diagnosis not present

## 2022-08-27 DIAGNOSIS — J9690 Respiratory failure, unspecified, unspecified whether with hypoxia or hypercapnia: Secondary | ICD-10-CM | POA: Diagnosis not present

## 2022-09-03 ENCOUNTER — Telehealth: Payer: Self-pay

## 2022-09-03 NOTE — Telephone Encounter (Signed)
     Patient  visit on 10/23 ay Arnold Line you been able to follow up with your primary care physician? yes  The patient was or was not able to obtain any needed medicine or equipment. yes  Are there diet recommendations that you are having difficulty following? yes  Patient expresses understanding of discharge instructions and education provided has no other needs at this time. yes    [place], Care Management  [id number] [address], [place], [place] [address] Phone: [phone] Email: [email or web address]

## 2022-09-04 DIAGNOSIS — H60332 Swimmer's ear, left ear: Secondary | ICD-10-CM | POA: Diagnosis not present

## 2022-09-04 DIAGNOSIS — H903 Sensorineural hearing loss, bilateral: Secondary | ICD-10-CM | POA: Diagnosis not present

## 2022-09-04 DIAGNOSIS — J343 Hypertrophy of nasal turbinates: Secondary | ICD-10-CM | POA: Diagnosis not present

## 2022-09-04 DIAGNOSIS — H6122 Impacted cerumen, left ear: Secondary | ICD-10-CM | POA: Diagnosis not present

## 2022-09-04 DIAGNOSIS — J310 Chronic rhinitis: Secondary | ICD-10-CM | POA: Diagnosis not present

## 2022-09-05 DIAGNOSIS — J449 Chronic obstructive pulmonary disease, unspecified: Secondary | ICD-10-CM | POA: Diagnosis not present

## 2022-09-17 ENCOUNTER — Other Ambulatory Visit: Payer: Self-pay | Admitting: Internal Medicine

## 2022-09-17 DIAGNOSIS — F331 Major depressive disorder, recurrent, moderate: Secondary | ICD-10-CM

## 2022-09-24 ENCOUNTER — Ambulatory Visit (HOSPITAL_COMMUNITY): Payer: Medicare Other | Admitting: Physical Therapy

## 2022-09-25 DIAGNOSIS — G4733 Obstructive sleep apnea (adult) (pediatric): Secondary | ICD-10-CM | POA: Diagnosis not present

## 2022-09-25 DIAGNOSIS — J449 Chronic obstructive pulmonary disease, unspecified: Secondary | ICD-10-CM | POA: Diagnosis not present

## 2022-09-27 DIAGNOSIS — I272 Pulmonary hypertension, unspecified: Secondary | ICD-10-CM | POA: Diagnosis not present

## 2022-09-27 DIAGNOSIS — J9690 Respiratory failure, unspecified, unspecified whether with hypoxia or hypercapnia: Secondary | ICD-10-CM | POA: Diagnosis not present

## 2022-09-27 DIAGNOSIS — G4733 Obstructive sleep apnea (adult) (pediatric): Secondary | ICD-10-CM | POA: Diagnosis not present

## 2022-09-27 DIAGNOSIS — J449 Chronic obstructive pulmonary disease, unspecified: Secondary | ICD-10-CM | POA: Diagnosis not present

## 2022-10-05 DIAGNOSIS — J449 Chronic obstructive pulmonary disease, unspecified: Secondary | ICD-10-CM | POA: Diagnosis not present

## 2022-10-11 ENCOUNTER — Ambulatory Visit (INDEPENDENT_AMBULATORY_CARE_PROVIDER_SITE_OTHER): Payer: Medicare Other | Admitting: Internal Medicine

## 2022-10-11 ENCOUNTER — Encounter: Payer: Self-pay | Admitting: Internal Medicine

## 2022-10-11 DIAGNOSIS — J019 Acute sinusitis, unspecified: Secondary | ICD-10-CM

## 2022-10-11 DIAGNOSIS — B9689 Other specified bacterial agents as the cause of diseases classified elsewhere: Secondary | ICD-10-CM | POA: Diagnosis not present

## 2022-10-11 DIAGNOSIS — J441 Chronic obstructive pulmonary disease with (acute) exacerbation: Secondary | ICD-10-CM

## 2022-10-11 MED ORDER — PREDNISONE 20 MG PO TABS: 40.0000 mg | ORAL_TABLET | Freq: Every day | ORAL | 0 refills | Status: AC

## 2022-10-11 MED ORDER — AMOXICILLIN-POT CLAVULANATE 875-125 MG PO TABS: 1.0000 | ORAL_TABLET | Freq: Two times a day (BID) | ORAL | 0 refills | Status: AC

## 2022-10-11 NOTE — Progress Notes (Signed)
   Acute Telephone Visit  Virtual Visit via Telephone Note  I connected with [name] on 10/11/22 at  1:20 PM EST by telephone and verified that I am speaking with the correct person using two identifiers.  Chief Complaint  Patient presents with   Nasal Congestion    Runny nose, nose stopped up at night, chest congestion, tired, watery eyes, dryness in throat. Home COVID test (3x) negative. GF tested positive for covid last week.    Location: Patient: [address], [place], [place] [address] Provider: [phone] [facility] [address]   I discussed the limitations, risks, security and privacy concerns of performing an evaluation and management service by telephone and the availability of in person appointments. I also discussed with the patient that there may be a patient responsible charge related to this service. The patient expressed understanding and agreed to proceed.   History of Present Illness:  Mr. Olliff has been evaluated today via acute telephone encounter for 3-day history of nasal congestion, sinus pressure, headache, increased fatigue, dyspnea on exertion, and throat irritation,.  His past medical history is remarkable for COPD and chronic hypoxic respiratory failure with baseline O2 requirement of 3L [place].  His girlfriend tested positive for COVID-19 last week.  Since that time he has tested negative for COVID 4 times negative for influenza once.  He currently denies fever/chills.  He endorses nasal congestion and sinus pressure with cloudy yellow secretions.  He endorses a headache as well.  Mr. Capili walks his dog each morning.  He is typically able to ambulate approximately 500 feet without feeling short of breath, however this morning he was only able to ambulate 25-30 feet.  His cough has been intermittently productive of yellow sputum.  He has been compliant with Trelegy but has not needed to use his rescue inhaler.  He has not tried any additional medications for  symptom relief.  Assessment and Plan:  COPD exacerbation Acute bacterial sinusitis Evaluated today via telephone encounter for a several day history of the symptoms endorsed above.  His secretions are now cloudy yellow and he has increased dyspnea with exertion consistent with COPD exacerbation vs bacterial sinusitis.  He has previously tested negative for both COVID and influenza. -I have prescribed prednisone 40 mg daily x 5 days for treatment of COPD -Augmentin x 5 days has been prescribed for treatment of bacterial infection -I have instructed him to present for in person evaluation should he develop fever/chills or increasing work of breathing.  Follow up instructions:   I discussed the assessment and treatment plan with the patient. The patient was provided an opportunity to ask questions and all were answered. The patient agreed with the plan and demonstrated an understanding of the instructions.   The patient was advised to call back or seek an in-person evaluation if the symptoms worsen or if the condition fails to improve as anticipated.  I provided 14 minutes of non-face-to-face time during this encounter.   Johnette Abraham, MD

## 2022-10-24 ENCOUNTER — Other Ambulatory Visit: Payer: Self-pay | Admitting: Pulmonary Disease

## 2022-10-24 DIAGNOSIS — J438 Other emphysema: Secondary | ICD-10-CM

## 2022-10-27 DIAGNOSIS — G4733 Obstructive sleep apnea (adult) (pediatric): Secondary | ICD-10-CM | POA: Diagnosis not present

## 2022-10-27 DIAGNOSIS — J449 Chronic obstructive pulmonary disease, unspecified: Secondary | ICD-10-CM | POA: Diagnosis not present

## 2022-10-27 DIAGNOSIS — J9690 Respiratory failure, unspecified, unspecified whether with hypoxia or hypercapnia: Secondary | ICD-10-CM | POA: Diagnosis not present

## 2022-10-27 DIAGNOSIS — I272 Pulmonary hypertension, unspecified: Secondary | ICD-10-CM | POA: Diagnosis not present

## 2022-11-05 DIAGNOSIS — J449 Chronic obstructive pulmonary disease, unspecified: Secondary | ICD-10-CM | POA: Diagnosis not present

## 2022-11-11 ENCOUNTER — Ambulatory Visit (INDEPENDENT_AMBULATORY_CARE_PROVIDER_SITE_OTHER): Payer: 59 | Admitting: Internal Medicine

## 2022-11-11 ENCOUNTER — Encounter: Payer: Self-pay | Admitting: Internal Medicine

## 2022-11-11 VITALS — BP 125/81 | HR 79 | Ht 67.0 in | Wt 237.8 lb

## 2022-11-11 DIAGNOSIS — I272 Pulmonary hypertension, unspecified: Secondary | ICD-10-CM | POA: Diagnosis not present

## 2022-11-11 DIAGNOSIS — M5136 Other intervertebral disc degeneration, lumbar region: Secondary | ICD-10-CM | POA: Diagnosis not present

## 2022-11-11 DIAGNOSIS — I700 Atherosclerosis of aorta: Secondary | ICD-10-CM

## 2022-11-11 DIAGNOSIS — R7303 Prediabetes: Secondary | ICD-10-CM

## 2022-11-11 DIAGNOSIS — F3341 Major depressive disorder, recurrent, in partial remission: Secondary | ICD-10-CM

## 2022-11-11 DIAGNOSIS — I10 Essential (primary) hypertension: Secondary | ICD-10-CM | POA: Diagnosis not present

## 2022-11-11 DIAGNOSIS — I5032 Chronic diastolic (congestive) heart failure: Secondary | ICD-10-CM | POA: Diagnosis not present

## 2022-11-11 DIAGNOSIS — J9611 Chronic respiratory failure with hypoxia: Secondary | ICD-10-CM

## 2022-11-11 DIAGNOSIS — J432 Centrilobular emphysema: Secondary | ICD-10-CM

## 2022-11-11 MED ORDER — SERTRALINE HCL 25 MG PO TABS: 25.0000 mg | ORAL_TABLET | Freq: Every day | ORAL | 1 refills | Status: DC

## 2022-11-11 MED ORDER — GABAPENTIN 300 MG PO CAPS: 300.0000 mg | ORAL_CAPSULE | Freq: Every day | ORAL | 3 refills | Status: DC

## 2022-11-11 NOTE — Assessment & Plan Note (Signed)
Noted on echo Likely due to COPD Followed by pulmonology and pulmonary HTN clinic at [facility]

## 2022-11-11 NOTE — Assessment & Plan Note (Signed)
On Trelegy and as needed albuterol On Daliresp Followed by pulmonology- Dr. Vaughan Browner Was referred to [facility] for lung reduction surgery

## 2022-11-11 NOTE — Assessment & Plan Note (Signed)
[place] Office Visit from 08/15/2022 in [facility]  PHQ-9 Total Score 0      Likely due to chronic medical conditions Referred to [facility] therapy Well-controlled with Zoloft 25 mg daily for now, needs to take it regularly

## 2022-11-11 NOTE — Assessment & Plan Note (Addendum)
HbA1C: 5.8 Was on Ozempic, for weight loss, started by Cardiology at [facility]

## 2022-11-11 NOTE — Assessment & Plan Note (Signed)
Uses continuous home O2 at 3-4 LPM Due to COPD

## 2022-11-11 NOTE — Assessment & Plan Note (Signed)
BMI Readings from Last 3 Encounters:  11/11/22 37.24 kg/m  08/26/22 36.49 kg/m  08/15/22 37.71 kg/m   Associated with HTN, HLD, prediabetes, OSA and MDD Diet modification and ambulation as tolerated Has lost 22 lbs with Ozempic

## 2022-11-11 NOTE — Assessment & Plan Note (Signed)
BP Readings from Last 1 Encounters:  11/11/22 125/81   Well-controlled with amlodipine Counseled for compliance with the medications Advised DASH diet and moderate exercise/walking as tolerated

## 2022-11-11 NOTE — Assessment & Plan Note (Signed)
Recent echo at [facility] showed normal LVEF and pulmonary HTN. Currently appears euvolemic On Farxiga, spironolactone and Lasix 

## 2022-11-11 NOTE — Patient Instructions (Signed)
Please start taking Gabapentin 300 mg for back pain.  Please continue to take other medications as prescribed.  Please continue to follow low salt diet and perform moderate exercise/walking as tolerated.

## 2022-11-11 NOTE — Assessment & Plan Note (Signed)
Chronic, intermittent, worse in the morning No red flags currently Advised to perform simple back stretching exercises Naproxen as needed Robaxin as needed for muscle spasms, but did not help much Back brace as needed Old X-ray of lumbar spine showed DDD of lumbar spine Referred to PT Increased dose of gabapentin to 300 mg nightly

## 2022-11-11 NOTE — Progress Notes (Signed)
Established Patient Office Visit  Subjective:  Patient ID: Ricardo Burch, male    DOB: [birth year]  Age: 62 y.o. MRN: [id number]  CC:  Chief Complaint  Patient presents with   Hypertension    Patient is following up for his hypertension and back pain. He does state he has low energy in the morning    HPI Ricardo Burch is a 62 y.o. male with past medical history of HTN, COPD, chronic hypoxic respiratory failure, OSA, GERD and chronic fatigue who presents for f/u of his chronic medical conditions.  HTN: BP is well-controlled. Takes medications regularly. Patient denies headache, dizziness, chest pain, or palpitations.   Chronic hypoxic respiratory failure and COPD: He uses Trelegy and as needed albuterol for COPD and follows up with Dr. Vaughan Browner.  He is being evaluated for lung reduction surgery.    CHF and pulmonary HTN: His echo showed pulmonary HTN, for which he was referred to pulmonology HTN clinic at [facility]. He currently uses home O2 at 3 LPM PRN.  He has chronic dyspnea.  He was placed on Farxiga for HFpEF and Ozempic for weight loss benefit by cardiology clinic at [facility]. He has lost about 22 lbs since 05/23. He has not been able to get Ozempic recently.   Depression: He has been taking Zoloft regularly.  Denies any SI or HI currently.  He also reports chronic low back pain, worse with bending, heavy lifting and prolonged sitting. Pain is sharp, chronic, intermittent, variable in intensity.  His pain is worse in the morning after waking up, which he attributes to his mattress.  His pain improves after moving around. He has tried taking naproxen without much relief.  Denies any numbness or tingling of the LE, but reports referred pain b/l LE in thigh and leg area.  He was given gabapentin, but has not had good relief.  He also reports fatigue in the morning due to it.   Past Medical History:  Diagnosis Date   Allergy    CHF (congestive heart failure) (HCC)    Chronic kidney disease     COPD (chronic obstructive pulmonary disease) ([place]) Dx 2015   Depression    Eczema    since childhood    Erectile dysfunction 08/18/2019   GERD (gastroesophageal reflux disease) 01/25/2020   Hyperlipidemia 01/12/2018   Hypertension    Oxygen deficiency    Screening for HIV (human immunodeficiency virus)    Sleep apnea    CPAP   Substance abuse ([place])    MJ, cocaine   Testicular failure 07/22/2019    Past Surgical History:  Procedure Laterality Date   COLONOSCOPY WITH PROPOFOL N/A 06/25/2017   Procedure: COLONOSCOPY WITH PROPOFOL;  Surgeon: Daneil Dolin, MD;  [facility];  Service: Endoscopy;  Laterality: N/A;  2:00pm   MULTIPLE EXTRACTIONS WITH ALVEOLOPLASTY N/A 03/22/2016   Procedure: MULTIPLE EXTRACTION WITH ALVEOLOPLASTY;  Surgeon: Diona Browner, DDS;  Location: [place];  Service: Oral Surgery;  Laterality: N/A;   NO PAST SURGERIES     POLYPECTOMY  06/25/2017   Procedure: POLYPECTOMY;  Surgeon: Daneil Dolin, MD;  [facility];  Service: Endoscopy;;  colon    Family History  Problem Relation Age of Onset   Arthritis Mother    Hypertension Mother    Stroke Mother        age 33   Cerebral aneurysm Father    Alcohol abuse Father    Cancer Brother    Diabetes Neg Hx  Heart disease Neg Hx     Social History   Socioeconomic History   Marital status: Single    Spouse name: Not on file   Number of children: 5   Years of education: 12   Highest education level: Not on file  Occupational History   Occupation: Unemployed     Comment: car wash details  Tobacco Use   Smoking status: Former    Packs/day: 1.00    Years: 38.00    Total pack years: 38.00    Types: Cigarettes    Quit date: 02/21/2020    Years since quitting: 2.7   Smokeless tobacco: Never  Vaping Use   Vaping Use: Never used  Substance and Sexual Activity   Alcohol use: Yes    Alcohol/week: 1.0 standard drink of alcohol    Types: 1 Cans of beer per week    Comment: Socially x  1/month currently (as of 01/25/20); previously: occassionally: weekends, 6-pack or less on a day; or half pint of liquior   Drug use: Yes    Types: Marijuana, Cocaine    Comment: 01/25/20-marijuana few weeks ago, no cocaine   Sexual activity: Never  Other Topics Concern   Not on file  Social History Narrative    Lives alone.    5 daughters 47, 6 yo twins, eldest 2 are married live in [place]. Retired.Girlfriend works in Armed forces logistics/support/administrative officer.   Social Determinants of Health   Financial Resource Strain: Low Risk  (12/31/2021)   Overall Financial Resource Strain (CARDIA)    Difficulty of Paying Living Expenses: Not very hard  Food Insecurity: No Food Insecurity (07/31/2022)   Hunger Vital Sign    Worried About Running Out of Food in the Last Year: Never true    Ran Out of Food in the Last Year: Never true  Transportation Needs: No Transportation Needs (07/31/2022)   PRAPARE - Hydrologist (Medical): No    Lack of Transportation (Non-Medical): No  Physical Activity: Insufficiently Active (02/11/2022)   Exercise Vital Sign    Days of Exercise per Week: 1 day    Minutes of Exercise per Session: 10 min  Stress: Stress Concern Present (02/11/2022)   [place]    Feeling of Stress : To some extent  Social Connections: Moderately Integrated (12/31/2021)   Social Connection and Isolation Panel [NHANES]    Frequency of Communication with Friends and Family: More than three times a week    Frequency of Social Gatherings with Friends and Family: Three times a week    Attends Religious Services: More than 4 times per year    Active Member of Clubs or Organizations: No    Attends Archivist Meetings: Never    Marital Status: Living with partner  Intimate Partner Violence: Not At Risk (12/31/2021)   Humiliation, Afraid, Rape, and Kick questionnaire    Fear of Current or Ex-Partner: No    Emotionally  Abused: No    Physically Abused: No    Sexually Abused: No    Outpatient Medications Prior to Visit  Medication Sig Dispense Refill   spironolactone (ALDACTONE) 25 MG tablet Take 25 mg by mouth daily.     albuterol (VENTOLIN HFA) 108 (90 Base) MCG/ACT inhaler INHALE 1 OR 2 PUFFS BY MOUTH EVERY 6 HOURS AS NEEDED FOR WHEEZING OR SHORTNESS OF BREATH 24 g 3   amLODipine (NORVASC) 5 MG tablet TAKE 1 TABLET BY MOUTH DAILY 30  tablet 10   FARXIGA 10 MG TABS tablet Take 10 mg by mouth daily.     fluticasone (FLONASE) 50 MCG/ACT nasal spray INSTILL 2 SPRAYS IN BOTH NOSTRILS TWICE DAILY 48 mL 3   Fluticasone-Umeclidin-Vilant (TRELEGY ELLIPTA) 200-62.5-25 MCG/ACT AEPB INHALE ONE (1) PUFF BY MOUTH DAILY 60 each 5   furosemide (LASIX) 20 MG tablet Take 20 mg by mouth daily.     ketoconazole (NIZORAL) 2 % cream Apply 1 Application topically daily. 15 g 0   levocetirizine (XYZAL) 5 MG tablet Take 1 tablet (5 mg total) by mouth every evening. 30 tablet 0   OXYGEN Inhale 3 L into the lungs daily. continuous     OZEMPIC, 0.25 OR 0.5 MG/DOSE, 2 MG/3ML SOPN SMARTSIG:0.25 Milligram(s) SUB-Q Once a Week (Patient not taking: Reported on 11/11/2022)     roflumilast (DALIRESP) 500 MCG TABS tablet TAKE 1 TABLET (500MCG) BY MOUTH DAILY 30 tablet 10   gabapentin (NEURONTIN) 100 MG capsule Take 1 capsule (100 mg total) by mouth at bedtime. 90 capsule 1   ofloxacin (FLOXIN OTIC) 0.3 % OTIC solution Place 5 drops into the left ear daily. 5 mL 0   sertraline (ZOLOFT) 25 MG tablet TAKE ONE TABLET BY MOUTH ONCE DAILY. 90 tablet 0   No facility-administered medications prior to visit.    Allergies  Allergen Reactions   Apresoline [Hydralazine] Other (See Comments)    Headache     ROS Review of Systems  Constitutional:  Positive for fatigue. Negative for chills and fever.  HENT:  Negative for congestion and sore throat.   Eyes:  Negative for pain and discharge.  Respiratory:  Positive for shortness of breath. Negative  for cough.   Cardiovascular:  Negative for chest pain and palpitations.  Gastrointestinal:  Negative for diarrhea, nausea and vomiting.  Endocrine: Negative for polydipsia and polyuria.  Genitourinary:  Negative for dysuria and hematuria.  Musculoskeletal:  Positive for back pain. Negative for neck pain and neck stiffness.  Skin:  Negative for rash.  Neurological:  Negative for dizziness, weakness, numbness and headaches.  Psychiatric/Behavioral:  Negative for agitation and behavioral problems.       Objective:    Physical Exam Vitals reviewed.  Constitutional:      General: He is not in acute distress.    Appearance: He is not diaphoretic.  HENT:     Head: Normocephalic and atraumatic.     Right Ear: External ear normal.     Nose: Nose normal.     Mouth/Throat:     Mouth: Mucous membranes are moist.  Eyes:     General: No scleral icterus.    Extraocular Movements: Extraocular movements intact.  Cardiovascular:     Rate and Rhythm: Normal rate and regular rhythm.     Pulses: Normal pulses.     Heart sounds: Normal heart sounds. No murmur heard. Pulmonary:     Breath sounds: Normal breath sounds. No wheezing or rales.     Comments: On home O2 - 3 lpm Musculoskeletal:     Cervical back: Neck supple. No tenderness.     Right lower leg: No edema.     Left lower leg: No edema.  Skin:    General: Skin is warm.     Findings: No rash.  Neurological:     General: No focal deficit present.     Mental Status: He is alert and oriented to person, place, and time.     Sensory: No sensory deficit.  Motor: No weakness.  Psychiatric:        Mood and Affect: Mood normal.        Behavior: Behavior normal.     BP 125/81 (BP Location: Left Arm, Patient Position: Sitting, Cuff Size: Large)   Pulse 79   Ht '5\' 7"'$  (1.702 m)   Wt 237 lb 12.8 oz (107.9 kg)   SpO2 (!) 82%   BMI 37.24 kg/m  Wt Readings from Last 3 Encounters:  11/11/22 237 lb 12.8 oz (107.9 kg)  08/26/22 240 lb  (108.9 kg)  08/15/22 240 lb 12.8 oz (109.2 kg)    Lab Results  Component Value Date   TSH 0.787 05/16/2022   Lab Results  Component Value Date   WBC 8.7 05/16/2022   HGB 16.4 05/16/2022   HCT 49.4 05/16/2022   MCV 91 05/16/2022   PLT 210 05/16/2022   Lab Results  Component Value Date   NA 135 06/11/2022   K 4.1 06/11/2022   CO2 28 06/11/2022   GLUCOSE 84 06/11/2022   BUN 15 06/11/2022   CREATININE 0.88 06/11/2022   BILITOT 1.4 (H) 05/16/2022   ALKPHOS 102 05/16/2022   AST 20 05/16/2022   ALT 18 05/16/2022   PROT 7.5 05/16/2022   ALBUMIN 4.3 05/16/2022   CALCIUM 8.9 06/11/2022   ANIONGAP 7 06/11/2022   EGFR 101 05/16/2022   Lab Results  Component Value Date   CHOL 156 05/16/2022   Lab Results  Component Value Date   HDL 51 05/16/2022   Lab Results  Component Value Date   LDLCALC 94 05/16/2022   Lab Results  Component Value Date   TRIG 52 05/16/2022   Lab Results  Component Value Date   CHOLHDL 3.1 05/16/2022   Lab Results  Component Value Date   HGBA1C 5.7 (H) 05/16/2022      Assessment & Plan:   Problem List Items Addressed This Visit       Cardiovascular and Mediastinum   Aortic atherosclerosis ([place])    Noted on CT chest BP elevated, needs to be compliant with antihypertensive Check lipid profile      Relevant Medications   spironolactone (ALDACTONE) 25 MG tablet   Other Relevant Orders   Lipid Profile   Hypertension    BP Readings from Last 1 Encounters:  11/11/22 125/81  Well-controlled with amlodipine Counseled for compliance with the medications Advised DASH diet and moderate exercise/walking as tolerated      Relevant Medications   spironolactone (ALDACTONE) 25 MG tablet   Other Relevant Orders   CMP14+EGFR   Chronic diastolic CHF (congestive heart failure) (HCC) - Primary    Recent echo at [facility] showed normal LVEF and pulmonary HTN. Currently appears euvolemic On Farxiga, spironolactone and Lasix      Relevant  Medications   spironolactone (ALDACTONE) 25 MG tablet   Pulmonary hypertension, moderate to severe (HCC)    Noted on echo Likely due to COPD Followed by pulmonology and pulmonary HTN clinic at [facility]      Relevant Medications   spironolactone (ALDACTONE) 25 MG tablet     Respiratory   COPD (chronic obstructive pulmonary disease) (HCC) (Chronic)    On Trelegy and as needed albuterol On Daliresp Followed by pulmonology- Dr. Vaughan Browner Was referred to [facility] for lung reduction surgery      Chronic respiratory failure with hypoxia ([place])    Uses continuous home O2 at 3-4 LPM Due to COPD        Musculoskeletal and Integument  DDD (degenerative disc disease), lumbar    Chronic, intermittent, worse in the morning No red flags currently Advised to perform simple back stretching exercises Naproxen as needed Robaxin as needed for muscle spasms, but did not help much Back brace as needed Old X-ray of lumbar spine showed DDD of lumbar spine Referred to PT Increased dose of gabapentin to 300 mg nightly      Relevant Medications   gabapentin (NEURONTIN) 300 MG capsule     Other   MDD (major depressive disorder), recurrent, in partial remission ([place])    [place] Office Visit from 08/15/2022 in [facility]  PHQ-9 Total Score 0     Likely due to chronic medical conditions Referred to [facility] therapy Well-controlled with Zoloft 25 mg daily for now, needs to take it regularly      Relevant Medications   sertraline (ZOLOFT) 25 MG tablet   Pre-diabetes    HbA1C: 5.8 Was on Ozempic, for weight loss, started by Cardiology at [facility]      Relevant Orders   Hemoglobin A1c   Morbid obesity ([place])    BMI Readings from Last 3 Encounters:  11/11/22 37.24 kg/m  08/26/22 36.49 kg/m  08/15/22 37.71 kg/m  Associated with HTN, HLD, prediabetes, OSA and MDD Diet modification and ambulation as tolerated Has lost 22 lbs with Ozempic       Meds ordered this encounter   Medications   gabapentin (NEURONTIN) 300 MG capsule    Sig: Take 1 capsule (300 mg total) by mouth at bedtime.    Dispense:  30 capsule    Refill:  3   sertraline (ZOLOFT) 25 MG tablet    Sig: Take 1 tablet (25 mg total) by mouth daily.    Dispense:  90 tablet    Refill:  1    Follow-up: Return in about 4 months (around 03/12/2023) for Annual physical.    [name], MD

## 2022-11-11 NOTE — Assessment & Plan Note (Signed)
Noted on CT chest BP elevated, needs to be compliant with antihypertensive Check lipid profile

## 2022-11-18 ENCOUNTER — Ambulatory Visit (INDEPENDENT_AMBULATORY_CARE_PROVIDER_SITE_OTHER): Payer: 59 | Admitting: Internal Medicine

## 2022-11-18 ENCOUNTER — Encounter: Payer: Self-pay | Admitting: Internal Medicine

## 2022-11-18 VITALS — BP 121/77 | HR 79 | Ht 69.0 in | Wt 237.8 lb

## 2022-11-18 DIAGNOSIS — J441 Chronic obstructive pulmonary disease with (acute) exacerbation: Secondary | ICD-10-CM | POA: Diagnosis not present

## 2022-11-18 DIAGNOSIS — J9611 Chronic respiratory failure with hypoxia: Secondary | ICD-10-CM | POA: Diagnosis not present

## 2022-11-18 MED ORDER — AZITHROMYCIN 250 MG PO TABS: ORAL_TABLET | ORAL | 0 refills | Status: AC

## 2022-11-18 MED ORDER — METHYLPREDNISOLONE 4 MG PO TBPK: ORAL_TABLET | ORAL | 0 refills | Status: DC

## 2022-11-18 NOTE — Patient Instructions (Signed)
Please start taking Azithromycin and Prednisone as prescribed.  Please continue taking other medications as prescribed.

## 2022-11-20 DIAGNOSIS — I10 Essential (primary) hypertension: Secondary | ICD-10-CM | POA: Diagnosis not present

## 2022-11-20 DIAGNOSIS — I700 Atherosclerosis of aorta: Secondary | ICD-10-CM | POA: Diagnosis not present

## 2022-11-20 DIAGNOSIS — R7303 Prediabetes: Secondary | ICD-10-CM | POA: Diagnosis not present

## 2022-11-20 NOTE — Assessment & Plan Note (Signed)
Uses continuous home O2 at 3-4 LPM Due to COPD

## 2022-11-20 NOTE — Assessment & Plan Note (Signed)
Recent worsening of cough and wheezing likely due to acute bronchitis/COPD exacerbation Started Z-Pak and Medrol dosepak Continue Trelegy and as needed albuterol for dyspnea or wheezing Robitussin as needed for cough

## 2022-11-20 NOTE — Progress Notes (Signed)
Acute Office Visit  Subjective:    Patient ID: Ricardo Burch, male    DOB: 26-Jul-1961, 62 y.o.   MRN: [id number]  Chief Complaint  Patient presents with   Cough    Patient having coughing, runny nose and congestion. Patient did take the covid vaccine on 11/11/2022.Patient said he took a at home covid test and it as negative    HPI Patient is in today for complaint of cough, nasal congestion and rattling in chest for the last 5 days.  He denies any fever or chills.  He had COVID vaccine in the last week, but his symptoms started 2 days after taking the vaccine.  He currently uses Trelegy and as needed albuterol for COPD.  He also has history of HFpEF, but denies any recent leg swelling, orthopnea or PND.  He has home O2 for chronic hypoxic respiratory failure.  Past Medical History:  Diagnosis Date   Allergy    CHF (congestive heart failure) (HCC)    Chronic kidney disease    COPD (chronic obstructive pulmonary disease) ([place]) Dx 2015   Depression    Eczema    since childhood    Erectile dysfunction 08/18/2019   GERD (gastroesophageal reflux disease) 01/25/2020   Hyperlipidemia 01/12/2018   Hypertension    Oxygen deficiency    Screening for HIV (human immunodeficiency virus)    Sleep apnea    CPAP   Substance abuse ([place])    MJ, cocaine   Testicular failure 07/22/2019    Past Surgical History:  Procedure Laterality Date   COLONOSCOPY WITH PROPOFOL N/A 06/25/2017   Procedure: COLONOSCOPY WITH PROPOFOL;  Surgeon: Daneil Dolin, MD;  [facility];  Service: Endoscopy;  Laterality: N/A;  2:00pm   MULTIPLE EXTRACTIONS WITH ALVEOLOPLASTY N/A 03/22/2016   Procedure: MULTIPLE EXTRACTION WITH ALVEOLOPLASTY;  Surgeon: Diona Browner, DDS;  Location: [place];  Service: Oral Surgery;  Laterality: N/A;   NO PAST SURGERIES     POLYPECTOMY  06/25/2017   Procedure: POLYPECTOMY;  Surgeon: Daneil Dolin, MD;  [facility];  Service: Endoscopy;;  colon    Family History   Problem Relation Age of Onset   Arthritis Mother    Hypertension Mother    Stroke Mother        age 57   Cerebral aneurysm Father    Alcohol abuse Father    Cancer Brother    Diabetes Neg Hx    Heart disease Neg Hx     Social History   Socioeconomic History   Marital status: Single    Spouse name: Not on file   Number of children: 5   Years of education: 12   Highest education level: Not on file  Occupational History   Occupation: Unemployed     Comment: car wash details  Tobacco Use   Smoking status: Former    Packs/day: 1.00    Years: 38.00    Total pack years: 38.00    Types: Cigarettes    Quit date: 02/21/2020    Years since quitting: 2.7   Smokeless tobacco: Never  Vaping Use   Vaping Use: Never used  Substance and Sexual Activity   Alcohol use: Yes    Alcohol/week: 1.0 standard drink of alcohol    Types: 1 Cans of beer per week    Comment: Socially x 1/month currently (as of 01/25/20); previously: occassionally: weekends, 6-pack or less on a day; or half pint of liquior   Drug use: Yes  Types: Marijuana, Cocaine    Comment: 01/25/20-marijuana few weeks ago, no cocaine   Sexual activity: Never  Other Topics Concern   Not on file  Social History Narrative    Lives alone.    5 daughters 10, 53 yo twins, eldest 2 are married live in [place]. Retired.Girlfriend works in Armed forces logistics/support/administrative officer.   Social Determinants of Health   Financial Resource Strain: Low Risk  (12/31/2021)   Overall Financial Resource Strain (CARDIA)    Difficulty of Paying Living Expenses: Not very hard  Food Insecurity: No Food Insecurity (07/31/2022)   Hunger Vital Sign    Worried About Running Out of Food in the Last Year: Never true    Ran Out of Food in the Last Year: Never true  Transportation Needs: No Transportation Needs (07/31/2022)   PRAPARE - Hydrologist (Medical): No    Lack of Transportation (Non-Medical): No  Physical Activity: Insufficiently Active  (02/11/2022)   Exercise Vital Sign    Days of Exercise per Week: 1 day    Minutes of Exercise per Session: 10 min  Stress: Stress Concern Present (02/11/2022)   [place]    Feeling of Stress : To some extent  Social Connections: Moderately Integrated (12/31/2021)   Social Connection and Isolation Panel [NHANES]    Frequency of Communication with Friends and Family: More than three times a week    Frequency of Social Gatherings with Friends and Family: Three times a week    Attends Religious Services: More than 4 times per year    Active Member of Clubs or Organizations: No    Attends Archivist Meetings: Never    Marital Status: Living with partner  Intimate Partner Violence: Not At Risk (12/31/2021)   Humiliation, Afraid, Rape, and Kick questionnaire    Fear of Current or Ex-Partner: No    Emotionally Abused: No    Physically Abused: No    Sexually Abused: No    Outpatient Medications Prior to Visit  Medication Sig Dispense Refill   albuterol (VENTOLIN HFA) 108 (90 Base) MCG/ACT inhaler INHALE 1 OR 2 PUFFS BY MOUTH EVERY 6 HOURS AS NEEDED FOR WHEEZING OR SHORTNESS OF BREATH 24 g 3   amLODipine (NORVASC) 5 MG tablet TAKE 1 TABLET BY MOUTH DAILY 30 tablet 10   FARXIGA 10 MG TABS tablet Take 10 mg by mouth daily.     fluticasone (FLONASE) 50 MCG/ACT nasal spray INSTILL 2 SPRAYS IN BOTH NOSTRILS TWICE DAILY 48 mL 3   Fluticasone-Umeclidin-Vilant (TRELEGY ELLIPTA) 200-62.5-25 MCG/ACT AEPB INHALE ONE (1) PUFF BY MOUTH DAILY 60 each 5   furosemide (LASIX) 20 MG tablet Take 20 mg by mouth daily.     gabapentin (NEURONTIN) 300 MG capsule Take 1 capsule (300 mg total) by mouth at bedtime. 30 capsule 3   ketoconazole (NIZORAL) 2 % cream Apply 1 Application topically daily. 15 g 0   levocetirizine (XYZAL) 5 MG tablet Take 1 tablet (5 mg total) by mouth every evening. 30 tablet 0   OXYGEN Inhale 3 L into the lungs daily.  continuous     roflumilast (DALIRESP) 500 MCG TABS tablet TAKE 1 TABLET (500MCG) BY MOUTH DAILY 30 tablet 10   sertraline (ZOLOFT) 25 MG tablet Take 1 tablet (25 mg total) by mouth daily. 90 tablet 1   spironolactone (ALDACTONE) 25 MG tablet Take 25 mg by mouth daily.     OZEMPIC, 0.25 OR 0.5 MG/DOSE,  2 MG/3ML SOPN SMARTSIG:0.25 Milligram(s) SUB-Q Once a Week (Patient not taking: Reported on 11/11/2022)     No facility-administered medications prior to visit.    Allergies  Allergen Reactions   Apresoline [Hydralazine] Other (See Comments)    Headache     Review of Systems  Constitutional:  Positive for fatigue. Negative for chills and fever.  HENT:  Positive for congestion, postnasal drip and sore throat.   Eyes:  Negative for pain and discharge.  Respiratory:  Positive for cough and shortness of breath.   Cardiovascular:  Negative for chest pain and palpitations.  Gastrointestinal:  Negative for diarrhea, nausea and vomiting.  Endocrine: Negative for polydipsia and polyuria.  Genitourinary:  Negative for dysuria and hematuria.  Musculoskeletal:  Positive for back pain. Negative for neck pain and neck stiffness.  Skin:  Negative for rash.  Neurological:  Negative for dizziness, weakness, numbness and headaches.  Psychiatric/Behavioral:  Negative for agitation and behavioral problems.        Objective:    Physical Exam Vitals reviewed.  Constitutional:      General: He is not in acute distress.    Appearance: He is not diaphoretic.  HENT:     Head: Normocephalic and atraumatic.     Right Ear: External ear normal.     Nose: Congestion present.     Mouth/Throat:     Mouth: Mucous membranes are moist.  Eyes:     General: No scleral icterus.    Extraocular Movements: Extraocular movements intact.  Cardiovascular:     Rate and Rhythm: Normal rate and regular rhythm.     Pulses: Normal pulses.     Heart sounds: Normal heart sounds. No murmur heard. Pulmonary:     Breath  sounds: Wheezing (Mild, upper lung fields) present. No rales.     Comments: On home O2 - 3 lpm Musculoskeletal:     Cervical back: Neck supple. No tenderness.     Right lower leg: No edema.     Left lower leg: No edema.  Skin:    General: Skin is warm.     Findings: No rash.  Neurological:     General: No focal deficit present.     Mental Status: He is alert and oriented to person, place, and time.     Sensory: No sensory deficit.     Motor: No weakness.  Psychiatric:        Mood and Affect: Mood normal.        Behavior: Behavior normal.     BP 121/77 (BP Location: Left Arm, Patient Position: Sitting, Cuff Size: Large)   Pulse 79   Ht '5\' 9"'$  (1.753 m)   Wt 237 lb 12.8 oz (107.9 kg)   SpO2 (!) 85%   BMI 35.12 kg/m  Wt Readings from Last 3 Encounters:  11/18/22 237 lb 12.8 oz (107.9 kg)  11/11/22 237 lb 12.8 oz (107.9 kg)  08/26/22 240 lb (108.9 kg)        Assessment & Plan:   Problem List Items Addressed This Visit       Respiratory   COPD exacerbation ([place]) - Primary    Recent worsening of cough and wheezing likely due to acute bronchitis/COPD exacerbation Started Z-Pak and Medrol dosepak Continue Trelegy and as needed albuterol for dyspnea or wheezing Robitussin as needed for cough      Relevant Medications   methylPREDNISolone (MEDROL DOSEPAK) 4 MG TBPK tablet   azithromycin (ZITHROMAX) 250 MG tablet   Chronic respiratory failure  with hypoxia ([place])    Uses continuous home O2 at 3-4 LPM Due to COPD        Meds ordered this encounter  Medications   methylPREDNISolone (MEDROL DOSEPAK) 4 MG TBPK tablet    Sig: Take as package instructions.    Dispense:  1 each    Refill:  0   azithromycin (ZITHROMAX) 250 MG tablet    Sig: Take 2 tablets on day 1, then 1 tablet daily on days 2 through 5    Dispense:  6 tablet    Refill:  0     Shequilla Goodgame Keith Rake, MD

## 2022-11-21 LAB — LIPID PANEL
Chol/HDL Ratio: 3.4 ratio (ref 0.0–5.0)
Cholesterol, Total: 160 mg/dL (ref 100–199)
HDL: 47 mg/dL (ref >39)
LDL Chol Calc (NIH): 99 mg/dL (ref 0–99)
Triglycerides: 70 mg/dL (ref 0–149)
VLDL Cholesterol Cal: 14 mg/dL (ref 5–40)

## 2022-11-21 LAB — CMP14+EGFR
ALT: 17 IU/L (ref 0–44)
AST: 19 IU/L (ref 0–40)
Albumin/Globulin Ratio: 1.3 (ref 1.2–2.2)
Albumin: 4.3 g/dL (ref 3.8–4.9)
Alkaline Phosphatase: 119 IU/L (ref 44–121)
BUN/Creatinine Ratio: 14 (ref 9–20)
BUN: 13 mg/dL (ref 6–24)
Bilirubin Total: 1 mg/dL (ref 0.0–1.2)
CO2: 26 mmol/L (ref 20–29)
Calcium: 9.1 mg/dL (ref 8.7–10.2)
Chloride: 97 mmol/L (ref 96–106)
Creatinine, Ser: 0.92 mg/dL (ref 0.76–1.27)
Globulin, Total: 3.2 g/dL (ref 1.5–4.5)
Glucose: 83 mg/dL (ref 70–99)
Potassium: 4.4 mmol/L (ref 3.5–5.2)
Sodium: 138 mmol/L (ref 134–144)
Total Protein: 7.5 g/dL (ref 6.0–8.5)
eGFR: 96 mL/min/{1.73_m2} (ref >59)

## 2022-11-21 LAB — HEMOGLOBIN A1C
Est. average glucose Bld gHb Est-mCnc: 114 mg/dL
Hgb A1c MFr Bld: 5.6 % (ref 4.8–5.6)

## 2022-12-04 DIAGNOSIS — I700 Atherosclerosis of aorta: Secondary | ICD-10-CM | POA: Diagnosis not present

## 2022-12-04 DIAGNOSIS — J432 Centrilobular emphysema: Secondary | ICD-10-CM | POA: Diagnosis not present

## 2022-12-04 DIAGNOSIS — J9611 Chronic respiratory failure with hypoxia: Secondary | ICD-10-CM | POA: Diagnosis not present

## 2022-12-04 DIAGNOSIS — I5032 Chronic diastolic (congestive) heart failure: Secondary | ICD-10-CM | POA: Diagnosis not present

## 2022-12-05 ENCOUNTER — Telehealth: Payer: Self-pay | Admitting: Pulmonary Disease

## 2022-12-05 NOTE — Telephone Encounter (Signed)
Called and spoke to patient and advised him that PM states to restart medication to prevent exacerbations of his COPD. He verbalized understanding. Nothing further needed

## 2022-12-05 NOTE — Telephone Encounter (Signed)
Called and spoke to patient and he states that he has not taken Daliresp in about a month now. He states it was out of stock at his pharmacy. He states it is back in stock and he is wondering ow he needs to restart this medication just to be safe.   Pleas advise sir

## 2022-12-05 NOTE — Telephone Encounter (Signed)
Yes. Please advise to restart the daliresp as it will prevent exacerbations of COPD

## 2022-12-06 DIAGNOSIS — J449 Chronic obstructive pulmonary disease, unspecified: Secondary | ICD-10-CM | POA: Diagnosis not present

## 2022-12-24 ENCOUNTER — Ambulatory Visit (INDEPENDENT_AMBULATORY_CARE_PROVIDER_SITE_OTHER): Payer: 59 | Admitting: Internal Medicine

## 2022-12-24 ENCOUNTER — Encounter: Payer: Self-pay | Admitting: Internal Medicine

## 2022-12-24 VITALS — BP 109/73 | HR 75 | Ht 69.0 in | Wt 239.4 lb

## 2022-12-24 DIAGNOSIS — I5032 Chronic diastolic (congestive) heart failure: Secondary | ICD-10-CM | POA: Diagnosis not present

## 2022-12-24 DIAGNOSIS — G4733 Obstructive sleep apnea (adult) (pediatric): Secondary | ICD-10-CM | POA: Diagnosis not present

## 2022-12-24 DIAGNOSIS — J449 Chronic obstructive pulmonary disease, unspecified: Secondary | ICD-10-CM | POA: Diagnosis not present

## 2022-12-24 DIAGNOSIS — J441 Chronic obstructive pulmonary disease with (acute) exacerbation: Secondary | ICD-10-CM

## 2022-12-24 MED ORDER — BENZONATATE 200 MG PO CAPS: 200.0000 mg | ORAL_CAPSULE | Freq: Two times a day (BID) | ORAL | 0 refills | Status: DC | PRN

## 2022-12-24 NOTE — Assessment & Plan Note (Signed)
Recent echo at [facility] showed normal LVEF and pulmonary HTN. Currently appears euvolemic On Farxiga, spironolactone and Lasix

## 2022-12-24 NOTE — Assessment & Plan Note (Addendum)
Recent worsening of cough and wheezing likely due to acute bronchitis/COPD in the setting of recent COVID infection, but have improved now Since his symptoms have started improving, would avoid steroid for now as he recently had Medrol Dosepak Continue Trelegy and as needed albuterol for dyspnea or wheezing Tessalon as needed for cough

## 2022-12-24 NOTE — Progress Notes (Signed)
Acute Office Visit  Subjective:    Patient ID: Ricardo Burch, male    DOB: 11/30/1960, 62 y.o.   MRN: [id number]  Chief Complaint  Patient presents with   COPD    COPD flare up. Patient tested positive for covid two weeks ago    HPI Patient is in today for evaluation for any recent COVID infection about 10 days ago.  He had a positive COVID test at home, for which he took symptomatic treatment with OTC antitussive.  He had nasal congestion, sinus pressure related headache, myalgias, cough, which have resolved now.  He reports improvement in his dyspnea since starting Trelegy.  He currently denies any dyspnea or wheezing.  He has O2 sat monitoring device, and states that his O2 sat remains above 92% on rest.  He has portable O2 device for as needed use.  Past Medical History:  Diagnosis Date   Allergy    CHF (congestive heart failure) (HCC)    Chronic kidney disease    COPD (chronic obstructive pulmonary disease) ([place]) Dx 2015   Depression    Eczema    since childhood    Erectile dysfunction 08/18/2019   GERD (gastroesophageal reflux disease) 01/25/2020   Hyperlipidemia 01/12/2018   Hypertension    Oxygen deficiency    Screening for HIV (human immunodeficiency virus)    Sleep apnea    CPAP   Substance abuse ([place])    MJ, cocaine   Testicular failure 07/22/2019    Past Surgical History:  Procedure Laterality Date   COLONOSCOPY WITH PROPOFOL N/A 06/25/2017   Procedure: COLONOSCOPY WITH PROPOFOL;  Surgeon: Daneil Dolin, MD;  [facility];  Service: Endoscopy;  Laterality: N/A;  2:00pm   MULTIPLE EXTRACTIONS WITH ALVEOLOPLASTY N/A 03/22/2016   Procedure: MULTIPLE EXTRACTION WITH ALVEOLOPLASTY;  Surgeon: Diona Browner, DDS;  Location: [facility];  Service: Oral Surgery;  Laterality: N/A;   NO PAST SURGERIES     POLYPECTOMY  06/25/2017   Procedure: POLYPECTOMY;  Surgeon: Daneil Dolin, MD;  [facility];  Service: Endoscopy;;  colon    Family History  Problem  Relation Age of Onset   Arthritis Mother    Hypertension Mother    Stroke Mother        age 28   Cerebral aneurysm Father    Alcohol abuse Father    Cancer Brother    Diabetes Neg Hx    Heart disease Neg Hx     Social History   Socioeconomic History   Marital status: Single    Spouse name: Not on file   Number of children: 5   Years of education: 12   Highest education level: Not on file  Occupational History   Occupation: Unemployed     Comment: car wash details  Tobacco Use   Smoking status: Former    Packs/day: 1.00    Years: 38.00    Total pack years: 38.00    Types: Cigarettes    Quit date: 02/21/2020    Years since quitting: 2.8   Smokeless tobacco: Never  Vaping Use   Vaping Use: Never used  Substance and Sexual Activity   Alcohol use: Yes    Alcohol/week: 1.0 standard drink of alcohol    Types: 1 Cans of beer per week    Comment: Socially x 1/month currently (as of 01/25/20); previously: occassionally: weekends, 6-pack or less on a day; or half pint of liquior   Drug use: Yes    Types: Marijuana,  Cocaine    Comment: 01/25/20-marijuana few weeks ago, no cocaine   Sexual activity: Never  Other Topics Concern   Not on file  Social History Narrative    Lives alone.    5 daughters 3, 65 yo twins, eldest 2 are married live in [place]. Retired.Girlfriend works in Armed forces logistics/support/administrative officer.   Social Determinants of Health   Financial Resource Strain: Low Risk  (12/31/2021)   Overall Financial Resource Strain (CARDIA)    Difficulty of Paying Living Expenses: Not very hard  Food Insecurity: No Food Insecurity (07/31/2022)   Hunger Vital Sign    Worried About Running Out of Food in the Last Year: Never true    Ran Out of Food in the Last Year: Never true  Transportation Needs: No Transportation Needs (07/31/2022)   PRAPARE - Hydrologist (Medical): No    Lack of Transportation (Non-Medical): No  Physical Activity: Insufficiently Active  (02/11/2022)   Exercise Vital Sign    Days of Exercise per Week: 1 day    Minutes of Exercise per Session: 10 min  Stress: Stress Concern Present (02/11/2022)   [place]    Feeling of Stress : To some extent  Social Connections: Moderately Integrated (12/31/2021)   Social Connection and Isolation Panel [NHANES]    Frequency of Communication with Friends and Family: More than three times a week    Frequency of Social Gatherings with Friends and Family: Three times a week    Attends Religious Services: More than 4 times per year    Active Member of Clubs or Organizations: No    Attends Archivist Meetings: Never    Marital Status: Living with partner  Intimate Partner Violence: Not At Risk (12/31/2021)   Humiliation, Afraid, Rape, and Kick questionnaire    Fear of Current or Ex-Partner: No    Emotionally Abused: No    Physically Abused: No    Sexually Abused: No    Outpatient Medications Prior to Visit  Medication Sig Dispense Refill   albuterol (VENTOLIN HFA) 108 (90 Base) MCG/ACT inhaler INHALE 1 OR 2 PUFFS BY MOUTH EVERY 6 HOURS AS NEEDED FOR WHEEZING OR SHORTNESS OF BREATH 24 g 3   amLODipine (NORVASC) 5 MG tablet TAKE 1 TABLET BY MOUTH DAILY 30 tablet 10   FARXIGA 10 MG TABS tablet Take 10 mg by mouth daily.     fluticasone (FLONASE) 50 MCG/ACT nasal spray INSTILL 2 SPRAYS IN BOTH NOSTRILS TWICE DAILY 48 mL 3   Fluticasone-Umeclidin-Vilant (TRELEGY ELLIPTA) 200-62.5-25 MCG/ACT AEPB INHALE ONE (1) PUFF BY MOUTH DAILY 60 each 5   furosemide (LASIX) 20 MG tablet Take 20 mg by mouth daily.     gabapentin (NEURONTIN) 300 MG capsule Take 1 capsule (300 mg total) by mouth at bedtime. 30 capsule 3   ketoconazole (NIZORAL) 2 % cream Apply 1 Application topically daily. 15 g 0   levocetirizine (XYZAL) 5 MG tablet Take 1 tablet (5 mg total) by mouth every evening. 30 tablet 0   OXYGEN Inhale 3 L into the lungs daily.  continuous     roflumilast (DALIRESP) 500 MCG TABS tablet TAKE 1 TABLET (500MCG) BY MOUTH DAILY 30 tablet 10   sertraline (ZOLOFT) 25 MG tablet Take 1 tablet (25 mg total) by mouth daily. 90 tablet 1   spironolactone (ALDACTONE) 25 MG tablet Take 25 mg by mouth daily.     methylPREDNISolone (MEDROL DOSEPAK) 4 MG TBPK tablet  Take as package instructions. 1 each 0   No facility-administered medications prior to visit.    Allergies  Allergen Reactions   Apresoline [Hydralazine] Other (See Comments)    Headache     Review of Systems  Constitutional:  Positive for fatigue. Negative for chills and fever.  HENT:  Negative for congestion, postnasal drip and sore throat.   Eyes:  Negative for pain and discharge.  Respiratory:  Positive for cough (Chronic) and shortness of breath.   Cardiovascular:  Negative for chest pain and palpitations.  Gastrointestinal:  Negative for diarrhea, nausea and vomiting.  Endocrine: Negative for polydipsia and polyuria.  Genitourinary:  Negative for dysuria and hematuria.  Musculoskeletal:  Positive for back pain. Negative for neck pain and neck stiffness.  Skin:  Negative for rash.  Neurological:  Negative for dizziness, weakness, numbness and headaches.  Psychiatric/Behavioral:  Negative for agitation and behavioral problems.        Objective:    Physical Exam Vitals reviewed.  Constitutional:      General: He is not in acute distress.    Appearance: He is not diaphoretic.  HENT:     Head: Normocephalic and atraumatic.     Right Ear: External ear normal.     Nose: No congestion.     Mouth/Throat:     Mouth: Mucous membranes are moist.  Eyes:     General: No scleral icterus.    Extraocular Movements: Extraocular movements intact.  Cardiovascular:     Rate and Rhythm: Normal rate and regular rhythm.     Heart sounds: Normal heart sounds. No murmur heard. Pulmonary:     Breath sounds: No wheezing or rales.     Comments: On room air today Has  home O2 - 3 lpm Musculoskeletal:     Cervical back: Neck supple. No tenderness.     Right lower leg: No edema.     Left lower leg: No edema.  Skin:    General: Skin is warm.     Findings: No rash.  Neurological:     General: No focal deficit present.     Mental Status: He is alert and oriented to person, place, and time.     Sensory: No sensory deficit.     Motor: No weakness.  Psychiatric:        Mood and Affect: Mood normal.        Behavior: Behavior normal.     BP 109/73 (BP Location: Left Arm, Patient Position: Sitting, Cuff Size: Large)   Pulse 75   Ht 5' 9"$  (1.753 m)   Wt 239 lb 6.4 oz (108.6 kg)   SpO2 (!) 89%   BMI 35.35 kg/m  Wt Readings from Last 3 Encounters:  12/24/22 239 lb 6.4 oz (108.6 kg)  11/18/22 237 lb 12.8 oz (107.9 kg)  11/11/22 237 lb 12.8 oz (107.9 kg)        Assessment & Plan:   Problem List Items Addressed This Visit       Cardiovascular and Mediastinum   Chronic diastolic CHF (congestive heart failure) ([place])    Recent echo at [facility] showed normal LVEF and pulmonary HTN. Currently appears euvolemic On Farxiga, spironolactone and Lasix        Respiratory   COPD exacerbation ([place]) - Primary    Recent worsening of cough and wheezing likely due to acute bronchitis/COPD in the setting of recent COVID infection, but have improved now Since his symptoms have started improving, would avoid steroid for now as  he recently had Medrol Dosepak Continue Trelegy and as needed albuterol for dyspnea or wheezing Tessalon as needed for cough      Relevant Medications   benzonatate (TESSALON) 200 MG capsule     Meds ordered this encounter  Medications   benzonatate (TESSALON) 200 MG capsule    Sig: Take 1 capsule (200 mg total) by mouth 2 (two) times daily as needed for cough.    Dispense:  20 capsule    Refill:  0     Klee Kolek Keith Rake, MD

## 2022-12-24 NOTE — Patient Instructions (Signed)
Please take Tessalon as needed for cough.  Please continue to use Trelegy once daily and Albuterol as needed for shortness of breath or wheezing.

## 2023-01-01 ENCOUNTER — Encounter: Payer: 59 | Admitting: Family Medicine

## 2023-01-02 ENCOUNTER — Encounter: Payer: Self-pay | Admitting: Radiology

## 2023-01-02 ENCOUNTER — Ambulatory Visit (INDEPENDENT_AMBULATORY_CARE_PROVIDER_SITE_OTHER): Payer: 59

## 2023-01-02 DIAGNOSIS — Z0000 Encounter for general adult medical examination without abnormal findings: Secondary | ICD-10-CM

## 2023-01-02 NOTE — Progress Notes (Signed)
Subjective:   Ricardo Burch is a 62 y.o. male who presents for Medicare Annual/Subsequent preventive examination.  Review of Systems    I connected with  [name] on 01/02/23 by a audio enabled telemedicine application and verified that I am speaking with the correct person using two identifiers.  Patient Location: Home  Provider Location: Office/Clinic  I discussed the limitations of evaluation and management by telemedicine. The patient expressed understanding and agreed to proceed.        Objective:    There were no vitals filed for this visit. There is no height or weight on file to calculate BMI.     08/26/2022    9:39 AM 01/29/2022    9:24 AM 12/31/2021   11:25 AM 09/28/2021    3:37 AM 02/14/2020    1:35 PM 11/05/2019    1:40 PM 08/23/2019   10:12 AM  Advanced Directives  Does Patient Have a Medical Advance Directive? No No No No No No No  Does patient want to make changes to medical advance directive?   No - Patient declined      Would patient like information on creating a medical advance directive?  No - Patient declined No - Patient declined  No - Patient declined  No - Patient declined    Current Medications (verified) Outpatient Encounter Medications as of 01/02/2023  Medication Sig   albuterol (VENTOLIN HFA) 108 (90 Base) MCG/ACT inhaler INHALE 1 OR 2 PUFFS BY MOUTH EVERY 6 HOURS AS NEEDED FOR WHEEZING OR SHORTNESS OF BREATH   amLODipine (NORVASC) 5 MG tablet TAKE 1 TABLET BY MOUTH DAILY   benzonatate (TESSALON) 200 MG capsule Take 1 capsule (200 mg total) by mouth 2 (two) times daily as needed for cough.   FARXIGA 10 MG TABS tablet Take 10 mg by mouth daily.   fluticasone (FLONASE) 50 MCG/ACT nasal spray INSTILL 2 SPRAYS IN BOTH NOSTRILS TWICE DAILY   Fluticasone-Umeclidin-Vilant (TRELEGY ELLIPTA) 200-62.5-25 MCG/ACT AEPB INHALE ONE (1) PUFF BY MOUTH DAILY   furosemide (LASIX) 20 MG tablet Take 20 mg by mouth daily.   gabapentin (NEURONTIN) 300 MG capsule Take 1  capsule (300 mg total) by mouth at bedtime.   ketoconazole (NIZORAL) 2 % cream Apply 1 Application topically daily.   levocetirizine (XYZAL) 5 MG tablet Take 1 tablet (5 mg total) by mouth every evening.   OXYGEN Inhale 3 L into the lungs daily. continuous   roflumilast (DALIRESP) 500 MCG TABS tablet TAKE 1 TABLET (500MCG) BY MOUTH DAILY   sertraline (ZOLOFT) 25 MG tablet Take 1 tablet (25 mg total) by mouth daily.   spironolactone (ALDACTONE) 25 MG tablet Take 25 mg by mouth daily.   No facility-administered encounter medications on file as of 01/02/2023.    Allergies (verified) Apresoline [hydralazine]   History: Past Medical History:  Diagnosis Date   Allergy    CHF (congestive heart failure) (HCC)    Chronic kidney disease    COPD (chronic obstructive pulmonary disease) ([place]) Dx 2015   Depression    Eczema    since childhood    Erectile dysfunction 08/18/2019   GERD (gastroesophageal reflux disease) 01/25/2020   Hyperlipidemia 01/12/2018   Hypertension    Oxygen deficiency    Screening for HIV (human immunodeficiency virus)    Sleep apnea    CPAP   Substance abuse ([place])    MJ, cocaine   Testicular failure 07/22/2019   Past Surgical History:  Procedure Laterality Date   COLONOSCOPY WITH PROPOFOL N/A  06/25/2017   Procedure: COLONOSCOPY WITH PROPOFOL;  Surgeon: Daneil Dolin, MD;  [facility];  Service: Endoscopy;  Laterality: N/A;  2:00pm   MULTIPLE EXTRACTIONS WITH ALVEOLOPLASTY N/A 03/22/2016   Procedure: MULTIPLE EXTRACTION WITH ALVEOLOPLASTY;  Surgeon: Diona Browner, DDS;  Location: [place];  Service: Oral Surgery;  Laterality: N/A;   NO PAST SURGERIES     POLYPECTOMY  06/25/2017   Procedure: POLYPECTOMY;  Surgeon: Daneil Dolin, MD;  [facility];  Service: Endoscopy;;  colon   Family History  Problem Relation Age of Onset   Arthritis Mother    Hypertension Mother    Stroke Mother        age 62   Cerebral aneurysm Father    Alcohol abuse  Father    Cancer Brother    Diabetes Neg Hx    Heart disease Neg Hx    Social History   Socioeconomic History   Marital status: Single    Spouse name: Not on file   Number of children: 5   Years of education: 12   Highest education level: Not on file  Occupational History   Occupation: Unemployed     Comment: car wash details  Tobacco Use   Smoking status: Former    Packs/day: 1.00    Years: 38.00    Total pack years: 38.00    Types: Cigarettes    Quit date: 02/21/2020    Years since quitting: 2.8   Smokeless tobacco: Never  Vaping Use   Vaping Use: Never used  Substance and Sexual Activity   Alcohol use: Yes    Alcohol/week: 1.0 standard drink of alcohol    Types: 1 Cans of beer per week    Comment: Socially x 1/month currently (as of 01/25/20); previously: occassionally: weekends, 6-pack or less on a day; or half pint of liquior   Drug use: Yes    Types: Marijuana, Cocaine    Comment: 01/25/20-marijuana few weeks ago, no cocaine   Sexual activity: Never  Other Topics Concern   Not on file  Social History Narrative    Lives alone.    5 daughters 50, 56 yo twins, eldest 2 are married live in [place]. Retired.Girlfriend works in Armed forces logistics/support/administrative officer.   Social Determinants of Health   Financial Resource Strain: Low Risk  (12/31/2021)   Overall Financial Resource Strain (CARDIA)    Difficulty of Paying Living Expenses: Not very hard  Food Insecurity: No Food Insecurity (07/31/2022)   Hunger Vital Sign    Worried About Running Out of Food in the Last Year: Never true    Ran Out of Food in the Last Year: Never true  Transportation Needs: No Transportation Needs (07/31/2022)   PRAPARE - Hydrologist (Medical): No    Lack of Transportation (Non-Medical): No  Physical Activity: Insufficiently Active (02/11/2022)   Exercise Vital Sign    Days of Exercise per Week: 1 day    Minutes of Exercise per Session: 10 min  Stress: Stress Concern Present  (02/11/2022)   [place]    Feeling of Stress : To some extent  Social Connections: Moderately Integrated (12/31/2021)   Social Connection and Isolation Panel [NHANES]    Frequency of Communication with Friends and Family: More than three times a week    Frequency of Social Gatherings with Friends and Family: Three times a week    Attends Religious Services: More than 4  times per year    Active Member of Clubs or Organizations: No    Attends Archivist Meetings: Never    Marital Status: Living with partner    Tobacco Counseling Counseling given: Not Answered   Clinical Intake:    Diabetic?NO   Activities of Daily Living     No data to display           Patient Care Team: [name], MD as PCP - General (Internal Medicine) Josue Hector, MD as PCP - Cardiology (Cardiology) Gala Romney Cristopher Estimable, MD as Consulting Physician (Gastroenterology) Shea Evans Norva Riffle, LCSW as [place] Management (Licensed Clinical Social Worker)  Indicate any recent [place] you may have received from other than [facility] providers in the past year (date may be approximate).     Assessment:   This is a routine wellness examination for Samridh.  Hearing/Vision screen No results found.  Dietary issues and exercise activities discussed:     Goals Addressed   None   Depression Screen    12/24/2022   11:28 AM 11/11/2022   11:03 AM 10/11/2022   11:41 AM 08/15/2022    8:48 AM 07/31/2022    3:11 PM 07/22/2022    3:49 PM 07/19/2022   11:17 AM  PHQ 2/9 Scores  PHQ - 2 Score 1 0 0 0 0 0 0  PHQ- 9 Score    0  0     Fall Risk    12/24/2022   11:28 AM 11/11/2022   11:03 AM 10/11/2022   11:41 AM 08/15/2022    8:48 AM 07/31/2022    3:12 PM  Fall Risk   Falls in the past year? 0 0 0 0 0  Number falls in past yr: 0 0 0 0   Injury with Fall? 0 0 0 0   Risk for fall due to :   No Fall Risks No Fall  Risks   Follow up   Falls evaluation completed Falls evaluation completed     FALL RISK PREVENTION PERTAINING TO THE HOME:  Any stairs in or around the home? Yes  If so, are there any without handrails? No  Home free of loose throw rugs in walkways, pet beds, electrical cords, etc? Yes  Adequate lighting in your home to reduce risk of falls? Yes   ASSISTIVE DEVICES UTILIZED TO PREVENT FALLS:  Life alert? No  Use of a cane, walker or w/c? No  Grab bars in the bathroom? No  Shower chair or bench in shower? No  Elevated toilet seat or a handicapped toilet? No           12/31/2021   11:23 AM  6CIT Screen  What Year? 0 points  What month? 0 points  What time? 0 points  Count back from 20 0 points  Months in reverse 0 points  Repeat phrase 0 points  Total Score 0 points    Immunizations Immunization History  Administered Date(s) Administered   Influenza Inj Mdck Quad With Preservative 08/09/2020   Influenza, High Dose Seasonal PF 06/29/2018   Influenza,inj,Quad PF,6+ Mos 09/15/2017, 08/25/2019, 08/25/2019, 11/02/2021, 07/09/2022   Moderna Covid-19 Vaccine Bivalent Booster 30yr & up 08/16/2021, 11/11/2022   Moderna Sars-Covid-2 Vaccination 01/24/2020, 02/21/2020, 10/03/2020   Pneumococcal Conjugate-13 09/15/2017   Pneumococcal Polysaccharide-23 06/15/2019   Tdap 05/26/2015, 01/29/2022   Zoster Recombinat (Shingrix) 11/04/2020, 02/14/2021    TDAP status: Up to date  Flu Vaccine status: Up to date   Covid-19 vaccine  status: Completed vaccines  Qualifies for Shingles Vaccine? Yes   Zostavax completed Yes   Shingrix Completed?: Yes  Screening Tests Health Maintenance  Topic Date Due   COVID-19 Vaccine (6 - 2023-24 season) 01/06/2023   Lung Cancer Screening  08/22/2023   Medicare Annual Wellness (AWV)  01/02/2024   COLONOSCOPY (Pts 45-45yr Insurance coverage will need to be confirmed)  06/26/2027   DTaP/Tdap/Td (3 - Td or Tdap) 01/30/2032   INFLUENZA VACCINE   Completed   Hepatitis C Screening  Completed   HIV Screening  Completed   Zoster Vaccines- Shingrix  Completed   HPV VACCINES  Aged Out    Health Maintenance  Health Maintenance Due  Topic Date Due   COVID-19 Vaccine (6 - 2023-24 season) 01/06/2023    Colorectal cancer screening: Type of screening: Colonoscopy. Completed 06/25/17. Repeat every 10 years  Lung Cancer Screening: (Low Dose CT Chest recommended if Age 60-80years, 30 pack-year currently smoking OR have quit w/in 15years.) does qualify.   Lung Cancer Screening Referral: YES ORDERED   Additional Screening:  Hepatitis C Screening: does qualify; Completed 11/24/21  Vision Screening: Recommended annual ophthalmology exams for early detection of glaucoma and other disorders of the eye. Is the patient up to date with their annual eye exam?  Yes  Who is the provider or what is the name of the office in which the patient attends annual eye exams? [facility] If pt is not established with a provider, would they like to be referred to a provider to establish care? No .   Dental Screening: Recommended annual dental exams for proper oral hygiene  Community Resource Referral / Chronic Care Management: CRR required this visit?  No   CCM required this visit?  No      Plan:     I have personally reviewed and noted the following in the patient's chart:   Medical and social history Use of alcohol, tobacco or illicit drugs  Current medications and supplements including opioid prescriptions. Patient is not currently taking opioid prescriptions. Functional ability and status Nutritional status Physical activity Advanced directives List of other physicians Hospitalizations, surgeries, and ER visits in previous 12 months Vitals Screenings to include cognitive, depression, and falls Referrals and appointments  In addition, I have reviewed and discussed with patient certain preventive protocols, quality metrics, and best  practice recommendations. A written personalized care plan for preventive services as well as general preventive health recommendations were provided to patient.     KQuentin Angst [place]  01/02/2023

## 2023-01-02 NOTE — Patient Instructions (Signed)
Ricardo Burch , Thank you for taking time to come for your Medicare Wellness Visit. I appreciate your ongoing commitment to your health goals. Please review the following plan we discussed and let me know if I can assist you in the future.   These are the goals we discussed:  Goals       "to stay healthy and keep losing weight" (pt-stated)      Care Coordination Interventions: Basic overview and discussion of pathophysiology of Heart Failure reviewed Assessed need for readable accurate scales in home Provided education about placing scale on hard, flat surface Advised patient to weigh each morning after emptying bladder Discussed importance of daily weight and advised patient to weigh and record daily Reviewed role of diuretics in prevention of fluid overload and management of heart failure; Discussed the importance of keeping all appointments with provider Screening for signs and symptoms of depression related to chronic disease state  Assessed social determinant of health barriers  Advised patient to track and manage COPD triggers Advised patient to self assesses COPD action plan zone and make appointment with provider if in the yellow zone for 48 hours without improvement Advised patient to engage in light exercise as tolerated 3-5 days a week to aid in the the management of COPD Provided education about and advised patient to utilize infection prevention strategies to reduce risk of respiratory infection Reviewed CHF and COPD action plan with emphasis on yellow zone Explained care coordination program, pt agreeable to today's outreach, states he is familiar with action plans, weighs several times per week, uses inhalers and oxygen as prescribed per pt report, has all medications and taking as prescribed, declines further follow up Annual wellness visit completed on 12/31/21 Pt reports he is on ozempic and has lost weight, plans to continue trying to lose weight and eat healthy         Depressive Symptoms Identified. Manage Depression issues (pt-stated)      Time Frame:  Short Term Goal Progress: On Track Priority:  Medium  Start Date:  12/28/21 End Date:  05/06/22    Follow Up Date:  03/14/22 at 2:00 PM   Depressive symptoms identified.  Manage Depression issues  Patient Self Care Activities:  Takes medications as prescribed Attends scheduled medical appointments  Patient Coping Strengths:  Support from [name]  Patient Self Care Deficits:  Short of breath on exertion Depression challenges  Patient Goals:  - spend time or talk with others at least 2 to 3 times per week - practice relaxation or meditation daily - keep a calendar with appointment dates  Follow Up Plan: LCSW to call client on 03/14/22 at 2:00 PM       Patient Stated      Lose weight      Quit smoking / using tobacco      Weight (lb) < 200 lb (90.7 kg)        This is a list of the screening recommended for you and due dates:  Health Maintenance  Topic Date Due   COVID-19 Vaccine (6 - 2023-24 season) 01/06/2023   Screening for Lung Cancer  08/22/2023   Medicare Annual Wellness Visit  01/02/2024   Colon Cancer Screening  06/26/2027   DTaP/Tdap/Td vaccine (3 - Td or Tdap) 01/30/2032   Flu Shot  Completed   Hepatitis C Screening: USPSTF Recommendation to screen - Ages 18-79 yo.  Completed   HIV Screening  Completed   Zoster (Shingles) Vaccine  Completed  HPV Vaccine  Aged Out

## 2023-01-04 DIAGNOSIS — J449 Chronic obstructive pulmonary disease, unspecified: Secondary | ICD-10-CM | POA: Diagnosis not present

## 2023-01-30 DIAGNOSIS — R0602 Shortness of breath: Secondary | ICD-10-CM | POA: Diagnosis not present

## 2023-01-30 DIAGNOSIS — I517 Cardiomegaly: Secondary | ICD-10-CM | POA: Diagnosis not present

## 2023-01-30 DIAGNOSIS — J432 Centrilobular emphysema: Secondary | ICD-10-CM | POA: Diagnosis not present

## 2023-01-30 DIAGNOSIS — I272 Pulmonary hypertension, unspecified: Secondary | ICD-10-CM | POA: Diagnosis not present

## 2023-02-04 DIAGNOSIS — J449 Chronic obstructive pulmonary disease, unspecified: Secondary | ICD-10-CM | POA: Diagnosis not present

## 2023-02-10 ENCOUNTER — Other Ambulatory Visit: Payer: Self-pay | Admitting: Internal Medicine

## 2023-02-10 ENCOUNTER — Telehealth: Payer: Self-pay | Admitting: Internal Medicine

## 2023-02-10 DIAGNOSIS — L304 Erythema intertrigo: Secondary | ICD-10-CM

## 2023-02-10 MED ORDER — KETOCONAZOLE 2 % EX CREA: 1.0000 | TOPICAL_CREAM | Freq: Every day | CUTANEOUS | 0 refills | Status: DC

## 2023-02-10 NOTE — Telephone Encounter (Signed)
Prescription Request  02/10/2023  LOV: 12/24/2022  What is the name of the medication or equipment? ketoconazole (NIZORAL) 2 % cream   Have you contacted your pharmacy to request a refill? Yes   Which pharmacy would you like this sent to?    [facility] - [place] [place] - [address], [place] [address] Phone: [phone]  Fax: [phone]  Patient notified that their request is being sent to the clinical staff for review and that they should receive a response within 2 business days.   Please advise at [facility] [phone]

## 2023-02-21 ENCOUNTER — Other Ambulatory Visit: Payer: Self-pay | Admitting: Pulmonary Disease

## 2023-03-06 DIAGNOSIS — J449 Chronic obstructive pulmonary disease, unspecified: Secondary | ICD-10-CM | POA: Diagnosis not present

## 2023-03-24 ENCOUNTER — Other Ambulatory Visit: Payer: Self-pay | Admitting: Internal Medicine

## 2023-03-24 ENCOUNTER — Encounter: Payer: 59 | Admitting: Internal Medicine

## 2023-03-24 DIAGNOSIS — I10 Essential (primary) hypertension: Secondary | ICD-10-CM

## 2023-03-28 ENCOUNTER — Ambulatory Visit
Admission: EM | Admit: 2023-03-28 | Discharge: 2023-03-28 | Disposition: A | Discharge status: Home / Self Care | Payer: 59 | Attending: Nurse Practitioner | Admitting: Nurse Practitioner

## 2023-03-28 ENCOUNTER — Other Ambulatory Visit: Payer: Self-pay

## 2023-03-28 ENCOUNTER — Encounter: Payer: Self-pay | Admitting: Emergency Medicine

## 2023-03-28 DIAGNOSIS — S60032A Contusion of left middle finger without damage to nail, initial encounter: Secondary | ICD-10-CM

## 2023-03-28 NOTE — ED Provider Notes (Signed)
[facility]    CSN: [id number] Arrival date & time: 03/28/23  1023      History   Chief Complaint Chief Complaint  Patient presents with   Finger Injury    HPI Ricardo Burch is a 62 y.o. male.   The history is provided by the patient.   Patient presents for complaints of pain to the left middle finger.  Patient states that he got the finger caught in a metal door approximately 1-1/2 to 2 weeks ago.  Since that time, he states he has had pain to the side of the cuticle that was caught in the door.  Patient states that he has had swelling in the tip of the middle finger.  He also states that the pain is described as "throbbing".  Patient denies numbness, tingling, decreased range of motion, or radiation of pain.  He reports he has been soaking the finger peroxide.  Patient was made aware that we do not have imaging capabilities today.  Past Medical History:  Diagnosis Date   Allergy    CHF (congestive heart failure) (HCC)    Chronic kidney disease    COPD (chronic obstructive pulmonary disease) (HCC) Dx 2015   Depression    Eczema    since childhood    Erectile dysfunction 08/18/2019   GERD (gastroesophageal reflux disease) 01/25/2020   Hyperlipidemia 01/12/2018   Hypertension    Oxygen deficiency    Screening for HIV (human immunodeficiency virus)    Sleep apnea    CPAP   Substance abuse (HCC)    MJ, cocaine   Testicular failure 07/22/2019    Patient Active Problem List   Diagnosis Date Noted   Recurrent acute serous otitis media of left ear 08/15/2022   Excessive ear wax, left 07/22/2022   Need for immunization against influenza 07/09/2022   Encounter for general adult medical examination with abnormal findings 03/05/2022   Chronic fatigue 11/28/2021   DDD (degenerative disc disease), lumbar 11/28/2021   Lung nodule 08/30/2021   Pulmonary hypertension, moderate to severe (HCC) 08/28/2021   GERD (gastroesophageal reflux disease) 01/25/2020   Bloating  01/25/2020   Morbid obesity (HCC) 12/06/2019   Erectile dysfunction 08/18/2019   Chronic diastolic CHF (congestive heart failure) (HCC) 09/23/2018   Hyperlipidemia 01/12/2018   Hypertension 10/14/2017   Aortic atherosclerosis (HCC) 09/15/2017   Pre-diabetes 09/15/2017   Cocaine abuse (HCC) 07/26/2016   MDD (major depressive disorder), recurrent, in partial remission (HCC) 07/26/2016   OSA on CPAP 07/26/2016   Alcohol abuse 06/27/2016   COPD (chronic obstructive pulmonary disease) (HCC) 08/23/2014   Eczema 08/23/2014   Right knee pain 08/23/2014   Tobacco abuse 03/27/2014   Chronic respiratory failure with hypoxia (HCC) 03/27/2014   COPD exacerbation (HCC) 11/19/2013    Past Surgical History:  Procedure Laterality Date   COLONOSCOPY WITH PROPOFOL N/A 06/25/2017   Procedure: COLONOSCOPY WITH PROPOFOL;  Surgeon: Corbin Ade, MD;  Location: [facility];  Service: Endoscopy;  Laterality: N/A;  2:00pm   MULTIPLE EXTRACTIONS WITH ALVEOLOPLASTY N/A 03/22/2016   Procedure: MULTIPLE EXTRACTION WITH ALVEOLOPLASTY;  Surgeon: Ocie Doyne, DDS;  Location: [facility];  Service: Oral Surgery;  Laterality: N/A;   NO PAST SURGERIES     POLYPECTOMY  06/25/2017   Procedure: POLYPECTOMY;  Surgeon: Corbin Ade, MD;  Location: [facility];  Service: Endoscopy;;  colon       Home Medications    Prior to Admission medications   Medication Sig Start Date End Date  Taking? Authorizing Provider  albuterol (VENTOLIN HFA) 108 (90 Base) MCG/ACT inhaler INHALE 1 OR 2 PUFFS BY MOUTH EVERY 6 HOURS AS NEEDED FOR WHEEZING OR SHORTNESS OF BREATH 06/18/22   Mannam, Praveen, MD  amLODipine (NORVASC) 5 MG tablet TAKE 1 TABLET BY MOUTH DAILY 03/25/23   Anabel Halon, MD  benzonatate (TESSALON) 200 MG capsule Take 1 capsule (200 mg total) by mouth 2 (two) times daily as needed for cough. 12/24/22   Anabel Halon, MD  FARXIGA 10 MG TABS tablet Take 10 mg by mouth daily. 02/25/22   [provider]   fluticasone (FLONASE) 50 MCG/ACT nasal spray INSTILL 2 SPRAYS IN BOTH NOSTRILS TWICE DAILY 06/18/22   Mannam, Colbert Coyer, MD  Fluticasone-Umeclidin-Vilant (TRELEGY ELLIPTA) 200-62.5-25 MCG/ACT AEPB INHALE ONE (1) PUFF BY MOUTH DAILY 02/24/23   Mannam, Praveen, MD  furosemide (LASIX) 20 MG tablet Take 20 mg by mouth daily. 12/25/21   [provider]  gabapentin (NEURONTIN) 300 MG capsule Take 1 capsule (300 mg total) by mouth at bedtime. 11/11/22   Anabel Halon, MD  ketoconazole (NIZORAL) 2 % cream Apply 1 Application topically daily. 02/10/23   Anabel Halon, MD  levocetirizine (XYZAL) 5 MG tablet Take 1 tablet (5 mg total) by mouth every evening. 07/19/22   Gilmore Laroche, FNP  OXYGEN Inhale 3 L into the lungs daily. continuous    [provider]  roflumilast (DALIRESP) 500 MCG TABS tablet TAKE 1 TABLET ( ) BY MOUTH DAILY 10/25/22   Mannam, Colbert Coyer, MD  sertraline (ZOLOFT) 25 MG tablet Take 1 tablet (25 mg total) by mouth daily. 11/11/22   Anabel Halon, MD  spironolactone (ALDACTONE) 25 MG tablet Take 25 mg by mouth daily. 09/17/22   [provider]    Family History Family History  Problem Relation Age of Onset   Arthritis Mother    Hypertension Mother    Stroke Mother        age 59   Cerebral aneurysm Father    Alcohol abuse Father    Cancer Brother    Diabetes Neg Hx    Heart disease Neg Hx     Social History Social History   Tobacco Use   Smoking status: Former    Packs/day: 1.00    Years: 38.00    Additional pack years: 0.00    Total pack years: 38.00    Types: Cigarettes    Quit date: 02/21/2020    Years since quitting: 3.0   Smokeless tobacco: Never  Vaping Use   Vaping Use: Never used  Substance Use Topics   Alcohol use: Yes    Alcohol/week: 1.0 standard drink of alcohol    Types: 1 Cans of beer per week    Comment: Socially x 1/month currently (as of 01/25/20); previously: occassionally: weekends, 6-pack or less on a day; or half pint  of liquior   Drug use: Not Currently    Types: Marijuana, Cocaine    Comment: 01/25/20-marijuana few weeks ago, no cocaine     Allergies   Apresoline [hydralazine]   Review of Systems Review of Systems Per HPI  Physical Exam Triage Vital Signs ED Triage Vitals  Enc Vitals Group     BP 03/28/23 1059 111/69     Pulse Rate 03/28/23 1059 88     Resp 03/28/23 1059 20     Temp 03/28/23 1059 98.4 F (36.9 C)     Temp Source 03/28/23 1059 Oral     SpO2 03/28/23 1059 92 %  Weight 03/28/23 1057 252 lb 3.2 oz (114.4 kg)     Height --      Head Circumference --      Peak Flow --      Pain Score 03/28/23 1100 4     Pain Loc --      Pain Edu? --      Excl. in GC? --    No data found.  Updated Vital Signs BP 111/69 (BP Location: Right Arm)   Pulse 88   Temp 98.4 F (36.9 C) (Oral)   Resp 20   Wt 252 lb 3.2 oz (114.4 kg)   SpO2 92% Comment: reports history of copd.  BMI 37.24 kg/m   Visual Acuity Right Eye Distance:   Left Eye Distance:   Bilateral Distance:    Right Eye Near:   Left Eye Near:    Bilateral Near:     Physical Exam Vitals and nursing note reviewed.  Constitutional:      General: He is not in acute distress.    Appearance: Normal appearance.  HENT:     Head: Normocephalic.  Eyes:     Extraocular Movements: Extraocular movements intact.     Pupils: Pupils are equal, round, and reactive to light.  Pulmonary:     Effort: Pulmonary effort is normal.  Musculoskeletal:     Comments: Swelling noted to the distal phalanx of the left middle finger. Nailbed is intact, but slightly raised on the medial aspect, tenderness noted to this area. No bruising, obvious deformity, swelling or erythema noted to the cuticle.   Skin:    General: Skin is warm and dry.  Neurological:     General: No focal deficit present.     Mental Status: He is alert and oriented to person, place, and time.  Psychiatric:        Mood and Affect: Mood normal.        Behavior:  Behavior normal.      UC Treatments / Results  Labs (all labs ordered are listed, but only abnormal results are displayed) Labs Reviewed - No data to display  EKG   Radiology No results found.  Procedures Procedures (including critical care time)  Medications Ordered in UC Medications - No data to display  Initial Impression / Assessment and Plan / UC Course  I have reviewed the triage vital signs and the nursing notes.  Pertinent labs & imaging results that were available during my care of the patient were reviewed by me and considered in my medical decision making (see chart for details).  Suspect a contusion to the distal tip of the left middle finger.  Swelling is present, along with tenderness.  Splint was provided for protection and support of the left middle finger.  Patient was given supportive care recommendations to include RICE therapy, over-the-counter analgesics for pain or discomfort, warm Epsom salt soaks.  Patient was advised that if symptoms continue to persist over the next 3 to 4 weeks, recommend that he can follow-up in this clinic or with orthopedics for further evaluation to include an x-ray.  Patient was in agreement with this plan of care and verbalizes understanding.  All questions were answered.  Patient stable for discharge.   Final Clinical Impressions(s) / UC Diagnoses   Final diagnoses:  Contusion of left middle finger without damage to nail, initial encounter     Discharge Instructions      You appear to have a bruise to the tip of the left middle  finger. May take over-the-counter Tylenol for pain or discomfort. RICE therapy rest, ice, compression, and elevation until your symptoms improve.  Apply ice for 20 minutes, remove for 1 hour, then repeat as often as possible.  This will help with pain and swelling. Recommend warm epsom salt soaks to help with pain and swelling. Use the splint to protect the finger. If symptoms do not improve within  the next 2 to 4 weeks, you may follow-up in this clinic for imaging or with orthopedics.  You can follow-up with [facility] at [phone] or [facility] in [place] at [phone].      ED Prescriptions   None    PDMP not reviewed this encounter.   Abran Cantor, NP 03/28/23 1227

## 2023-03-28 NOTE — Discharge Instructions (Signed)
You appear to have a bruise to the tip of the left middle finger. May take over-the-counter Tylenol for pain or discomfort. RICE therapy rest, ice, compression, and elevation until your symptoms improve.  Apply ice for 20 minutes, remove for 1 hour, then repeat as often as possible.  This will help with pain and swelling. Recommend warm epsom salt soaks to help with pain and swelling. Use the splint to protect the finger. If symptoms do not improve within the next 2 to 4 weeks, you may follow-up in this clinic for imaging or with orthopedics.  You can follow-up with [facility]  at [phone] or [facility] in [place] at [phone].

## 2023-03-28 NOTE — ED Triage Notes (Signed)
Pt reports left middle finger was caught in metal door x2 weeks. Pt reports "it sensitive to touch in my cuticle." No obvious deformity noted.   Pt aware do not have xray capabilities at [facility] today.

## 2023-04-06 DIAGNOSIS — J449 Chronic obstructive pulmonary disease, unspecified: Secondary | ICD-10-CM | POA: Diagnosis not present

## 2023-04-10 ENCOUNTER — Encounter: Payer: Self-pay | Admitting: Internal Medicine

## 2023-04-10 ENCOUNTER — Ambulatory Visit (INDEPENDENT_AMBULATORY_CARE_PROVIDER_SITE_OTHER): Payer: 59 | Admitting: Internal Medicine

## 2023-04-10 VITALS — BP 108/71 | HR 80 | Ht 69.0 in | Wt 234.6 lb

## 2023-04-10 DIAGNOSIS — M5136 Other intervertebral disc degeneration, lumbar region: Secondary | ICD-10-CM | POA: Diagnosis not present

## 2023-04-10 DIAGNOSIS — K5909 Other constipation: Secondary | ICD-10-CM

## 2023-04-10 DIAGNOSIS — Z125 Encounter for screening for malignant neoplasm of prostate: Secondary | ICD-10-CM

## 2023-04-10 DIAGNOSIS — I5032 Chronic diastolic (congestive) heart failure: Secondary | ICD-10-CM | POA: Diagnosis not present

## 2023-04-10 DIAGNOSIS — I10 Essential (primary) hypertension: Secondary | ICD-10-CM

## 2023-04-10 DIAGNOSIS — J432 Centrilobular emphysema: Secondary | ICD-10-CM | POA: Diagnosis not present

## 2023-04-10 DIAGNOSIS — E782 Mixed hyperlipidemia: Secondary | ICD-10-CM

## 2023-04-10 DIAGNOSIS — I272 Pulmonary hypertension, unspecified: Secondary | ICD-10-CM | POA: Diagnosis not present

## 2023-04-10 DIAGNOSIS — R7303 Prediabetes: Secondary | ICD-10-CM | POA: Diagnosis not present

## 2023-04-10 DIAGNOSIS — Z0001 Encounter for general adult medical examination with abnormal findings: Secondary | ICD-10-CM | POA: Diagnosis not present

## 2023-04-10 DIAGNOSIS — M51369 Other intervertebral disc degeneration, lumbar region without mention of lumbar back pain or lower extremity pain: Secondary | ICD-10-CM

## 2023-04-10 DIAGNOSIS — E559 Vitamin D deficiency, unspecified: Secondary | ICD-10-CM | POA: Diagnosis not present

## 2023-04-10 DIAGNOSIS — F3341 Major depressive disorder, recurrent, in partial remission: Secondary | ICD-10-CM

## 2023-04-10 MED ORDER — SENNA-DOCUSATE SODIUM 8.6-50 MG PO TABS: 1.0000 | ORAL_TABLET | Freq: Every day | ORAL | 3 refills | Status: DC

## 2023-04-10 MED ORDER — CYCLOBENZAPRINE HCL 5 MG PO TABS: 5.0000 mg | ORAL_TABLET | Freq: Two times a day (BID) | ORAL | 1 refills | Status: DC | PRN

## 2023-04-10 MED ORDER — GABAPENTIN 300 MG PO CAPS: 300.0000 mg | ORAL_CAPSULE | Freq: Every day | ORAL | 1 refills | Status: DC

## 2023-04-10 NOTE — Patient Instructions (Addendum)
Please take Gabapentin 300 mg at bedtime for back pain. Please take Cyclobenzaprine as needed for back muscle spasms/stiffness.  Please avoid heavy lifting and frequent bending.  Please take Senokot S for constipation.  Please continue to take other medications as prescribed.  Please continue to follow low carb diet and perform moderate exercise/walking at least 150 mins/week.  Please get fasting blood tests done before the next visit.

## 2023-04-10 NOTE — Assessment & Plan Note (Signed)
Noted on echo Likely due to COPD Followed by pulmonology and pulmonary HTN clinic at [facility] 

## 2023-04-10 NOTE — Progress Notes (Signed)
Established Patient Office Visit  Subjective:  Patient ID: Ricardo Burch, male    DOB: 1961/06/25  Age: 62 y.o. MRN: [id number]  CC:  Chief Complaint  Patient presents with   Annual Exam    Patient feels the ozempic is causing him to have low energy and pains in lower back and groin     HPI Ricardo Burch is a 62 y.o. male with past medical history of HTN, COPD, chronic hypoxic respiratory failure, OSA, GERD and chronic fatigue who presents for annual physical.  HTN: BP is well-controlled. Takes medications regularly. Patient denies headache, dizziness, chest pain, or palpitations.  Chronic hypoxic respiratory failure and COPD: He uses Trelegy and as needed albuterol for COPD and follows up with Dr. Isaiah Serge.  CHF and pulmonary HTN: His echo showed pulmonary HTN, for which he was referred to pulmonology HTN clinic at [facility]. He currently uses home O2 at 3 LPM PRN.  He has chronic dyspnea, but has improved lately with weight loss.  He was placed on Farxiga for HFpEF and Ozempic for weight loss benefit by cardiology clinic at [facility]. He has lost about 25 lbs since 05/23.  He reports that he feels fatigued and constipated with Ozempic.  He also reports worsening of back pain when he takes Ozempic, which could be due to constipation.  Depression: He has stopped taking Zoloft as he feels better now. Denies any SI or HI currently.  He also reports chronic low back pain, worse with bending, heavy lifting and prolonged sitting. Pain is sharp, chronic, intermittent, variable in intensity.  His pain is worse in the morning after waking up, which he attributes to his mattress.  His pain improves after moving around. He has tried taking naproxen without much relief.  Denies any numbness or tingling of the LE, but reports referred pain b/l LE in thigh and leg area.  He was given gabapentin, but has not been taking it regularly.  Past Medical History:  Diagnosis Date   Allergy    CHF (congestive heart  failure) (HCC)    Chronic kidney disease    COPD (chronic obstructive pulmonary disease) (HCC) Dx 2015   Depression    Eczema    since childhood    Erectile dysfunction 08/18/2019   GERD (gastroesophageal reflux disease) 01/25/2020   Hyperlipidemia 01/12/2018   Hypertension    Oxygen deficiency    Screening for HIV (human immunodeficiency virus)    Sleep apnea    CPAP   Substance abuse (HCC)    MJ, cocaine   Testicular failure 07/22/2019    Past Surgical History:  Procedure Laterality Date   COLONOSCOPY WITH PROPOFOL N/A 06/25/2017   Procedure: COLONOSCOPY WITH PROPOFOL;  Surgeon: Corbin Ade, MD;  Location: [facility];  Service: Endoscopy;  Laterality: N/A;  2:00pm   MULTIPLE EXTRACTIONS WITH ALVEOLOPLASTY N/A 03/22/2016   Procedure: MULTIPLE EXTRACTION WITH ALVEOLOPLASTY;  Surgeon: Ocie Doyne, DDS;  Location: [facility] OR;  Service: Oral Surgery;  Laterality: N/A;   NO PAST SURGERIES     POLYPECTOMY  06/25/2017   Procedure: POLYPECTOMY;  Surgeon: Corbin Ade, MD;  Location: [facility];  Service: Endoscopy;;  colon    Family History  Problem Relation Age of Onset   Arthritis Mother    Hypertension Mother    Stroke Mother        age 40   Cerebral aneurysm Father    Alcohol abuse Father    Cancer Brother    Diabetes Neg  Hx    Heart disease Neg Hx     Social History   Socioeconomic History   Marital status: Single    Spouse name: Not on file   Number of children: 5   Years of education: 12   Highest education level: Not on file  Occupational History   Occupation: Unemployed     Comment: car wash details  Tobacco Use   Smoking status: Former    Packs/day: 1.00    Years: 38.00    Additional pack years: 0.00    Total pack years: 38.00    Types: Cigarettes    Quit date: 02/21/2020    Years since quitting: 3.1   Smokeless tobacco: Never  Vaping Use   Vaping Use: Never used  Substance and Sexual Activity   Alcohol use: Yes    Alcohol/week: 1.0 standard  drink of alcohol    Types: 1 Cans of beer per week    Comment: Socially x 1/month currently (as of 01/25/20); previously: occassionally: weekends, 6-pack or less on a day; or half pint of liquior   Drug use: Not Currently    Types: Marijuana, Cocaine    Comment: 01/25/20-marijuana few weeks ago, no cocaine   Sexual activity: Never  Other Topics Concern   Not on file  Social History Narrative    Lives alone.    5 daughters 33, 34 yo twins, eldest 2 are married live in [place]. Retired.Girlfriend works in Programmer, applications.   Social Determinants of Health   Financial Resource Strain: Low Risk  (01/02/2023)   Overall Financial Resource Strain (CARDIA)    Difficulty of Paying Living Expenses: Not hard at all  Food Insecurity: No Food Insecurity (01/02/2023)   Hunger Vital Sign    Worried About Running Out of Food in the Last Year: Never true    Ran Out of Food in the Last Year: Never true  Transportation Needs: No Transportation Needs (01/02/2023)   PRAPARE - Administrator, Civil Service (Medical): No    Lack of Transportation (Non-Medical): No  Physical Activity: Sufficiently Active (01/02/2023)   Exercise Vital Sign    Days of Exercise per Week: 7 days    Minutes of Exercise per Session: 30 min  Stress: No Stress Concern Present (01/02/2023)   [employer] of Occupational Health - Occupational Stress Questionnaire    Feeling of Stress : Not at all  Social Connections: Unknown (01/02/2023)   Social Connection and Isolation Panel [NHANES]    Frequency of Communication with Friends and Family: More than three times a week    Frequency of Social Gatherings with Friends and Family: More than three times a week    Attends Religious Services: More than 4 times per year    Active Member of [employer] or Organizations: No    Attends Banker Meetings: Never    Marital Status: Patient declined  Catering manager Violence: Not At Risk (01/02/2023)   Humiliation, Afraid, Rape,  and Kick questionnaire    Fear of Current or Ex-Partner: No    Emotionally Abused: No    Physically Abused: No    Sexually Abused: No    Outpatient Medications Prior to Visit  Medication Sig Dispense Refill   albuterol (VENTOLIN HFA) 108 (90 Base) MCG/ACT inhaler INHALE 1 OR 2 PUFFS BY MOUTH EVERY 6 HOURS AS NEEDED FOR WHEEZING OR SHORTNESS OF BREATH 24 g 3   amLODipine (NORVASC) 5 MG tablet TAKE 1 TABLET BY MOUTH DAILY 30 tablet  10   benzonatate (TESSALON) 200 MG capsule Take 1 capsule (200 mg total) by mouth 2 (two) times daily as needed for cough. 20 capsule 0   FARXIGA 10 MG TABS tablet Take 10 mg by mouth daily.     fluticasone (FLONASE) 50 MCG/ACT nasal spray INSTILL 2 SPRAYS IN BOTH NOSTRILS TWICE DAILY 48 mL 3   Fluticasone-Umeclidin-Vilant (TRELEGY ELLIPTA) 200-62.5-25 MCG/ACT AEPB INHALE ONE (1) PUFF BY MOUTH DAILY 60 each 5   furosemide (LASIX) 20 MG tablet Take 20 mg by mouth daily.     ketoconazole (NIZORAL) 2 % cream Apply 1 Application topically daily. 30 g 0   levocetirizine (XYZAL) 5 MG tablet Take 1 tablet (5 mg total) by mouth every evening. 30 tablet 0   OXYGEN Inhale 3 L into the lungs daily. continuous     roflumilast (DALIRESP) 500 MCG TABS tablet TAKE 1 TABLET ( ) BY MOUTH DAILY 30 tablet 10   spironolactone (ALDACTONE) 25 MG tablet Take 25 mg by mouth daily.     gabapentin (NEURONTIN) 300 MG capsule Take 1 capsule (300 mg total) by mouth at bedtime. 30 capsule 3   sertraline (ZOLOFT) 25 MG tablet Take 1 tablet (25 mg total) by mouth daily. 90 tablet 1   No facility-administered medications prior to visit.    Allergies  Allergen Reactions   Apresoline [Hydralazine] Other (See Comments)    Headache     ROS Review of Systems  Constitutional:  Positive for fatigue. Negative for chills and fever.  HENT:  Negative for congestion and sore throat.   Eyes:  Negative for pain and discharge.  Respiratory:  Positive for shortness of breath. Negative for cough.    Cardiovascular:  Negative for chest pain and palpitations.  Gastrointestinal:  Negative for diarrhea, nausea and vomiting.  Endocrine: Negative for polydipsia and polyuria.  Genitourinary:  Negative for dysuria and hematuria.  Musculoskeletal:  Positive for back pain. Negative for neck pain and neck stiffness.  Skin:  Negative for rash.  Neurological:  Negative for dizziness, weakness, numbness and headaches.  Psychiatric/Behavioral:  Negative for agitation and behavioral problems.       Objective:    Physical Exam Vitals reviewed.  Constitutional:      General: He is not in acute distress.    Appearance: He is not diaphoretic.  HENT:     Head: Normocephalic and atraumatic.     Nose: Nose normal.     Mouth/Throat:     Mouth: Mucous membranes are moist.  Eyes:     General: No scleral icterus.    Extraocular Movements: Extraocular movements intact.  Cardiovascular:     Rate and Rhythm: Normal rate and regular rhythm.     Pulses: Normal pulses.     Heart sounds: Normal heart sounds. No murmur heard. Pulmonary:     Breath sounds: Normal breath sounds. No wheezing or rales.     Comments: On home O2 - 3 lpm Abdominal:     Palpations: Abdomen is soft.     Tenderness: There is no abdominal tenderness.  Musculoskeletal:     Cervical back: Neck supple. No tenderness.     Lumbar back: Tenderness (right paraspinal) present. Decreased range of motion.     Right lower leg: No edema.     Left lower leg: No edema.  Skin:    General: Skin is warm.     Findings: No rash.  Neurological:     General: No focal deficit present.     Mental  Status: He is alert and oriented to person, place, and time.     Cranial Nerves: No cranial nerve deficit.     Sensory: No sensory deficit.     Motor: No weakness.  Psychiatric:        Mood and Affect: Mood normal.        Behavior: Behavior normal.     BP 108/71 (BP Location: Right Arm, Patient Position: Sitting, Cuff Size: Large)   Pulse 80    Ht 5\' 9"  (1.753 m)   Wt 234 lb 9.6 oz (106.4 kg)   SpO2 90%   BMI 34.64 kg/m  Wt Readings from Last 3 Encounters:  04/10/23 234 lb 9.6 oz (106.4 kg)  03/28/23 252 lb 3.2 oz (114.4 kg)  12/24/22 239 lb 6.4 oz (108.6 kg)    Lab Results  Component Value Date   TSH 0.787 05/16/2022   Lab Results  Component Value Date   WBC 8.7 05/16/2022   HGB 16.4 05/16/2022   HCT 49.4 05/16/2022   MCV 91 05/16/2022   PLT 210 05/16/2022   Lab Results  Component Value Date   NA 138 11/20/2022   K 4.4 11/20/2022   CO2 26 11/20/2022   GLUCOSE 83 11/20/2022   BUN 13 11/20/2022   CREATININE 0.92 11/20/2022   BILITOT 1.0 11/20/2022   ALKPHOS 119 11/20/2022   AST 19 11/20/2022   ALT 17 11/20/2022   PROT 7.5 11/20/2022   ALBUMIN 4.3 11/20/2022   CALCIUM 9.1 11/20/2022   ANIONGAP 7 06/11/2022   EGFR 96 11/20/2022   Lab Results  Component Value Date   CHOL 160 11/20/2022   Lab Results  Component Value Date   HDL 47 11/20/2022   Lab Results  Component Value Date   LDLCALC 99 11/20/2022   Lab Results  Component Value Date   TRIG 70 11/20/2022   Lab Results  Component Value Date   CHOLHDL 3.4 11/20/2022   Lab Results  Component Value Date   HGBA1C 5.6 11/20/2022      Assessment & Plan:   Problem List Items Addressed This Visit       Cardiovascular and Mediastinum   Hypertension    BP Readings from Last 1 Encounters:  04/10/23 108/71  Well-controlled with amlodipine Counseled for compliance with the medications Advised DASH diet and moderate exercise/walking as tolerated      Relevant Orders   TSH   CMP14+EGFR   CBC with Differential/Platelet   Chronic diastolic CHF (congestive heart failure) (HCC)    Recent echo at [facility] showed normal LVEF and pulmonary HTN. Currently appears euvolemic On Farxiga, spironolactone and Lasix Was placed on Ozempic by cardiology, but he has chronic fatigue with it - advised to follow [place], frequent meals      Pulmonary  hypertension, moderate to severe (HCC)    Noted on echo Likely due to COPD Followed by pulmonology and pulmonary HTN clinic at [facility]        Respiratory   COPD (chronic obstructive pulmonary disease) (HCC) (Chronic)    On Trelegy and as needed albuterol On Daliresp Followed by pulmonology- Dr. Isaiah Serge Was referred to [facility] for lung reduction surgery        Digestive   Chronic constipation    Likely worse due to Ozempic Advised to start Senokot for constipation      Relevant Medications   sennosides-docusate sodium (SENOKOT-S) 8.6-50 MG tablet     Musculoskeletal and Integument   DDD (degenerative disc disease), lumbar  Chronic, intermittent, worse in the morning No red flags currently Advised to perform simple back stretching exercises Naproxen as needed Flexeril as needed for muscle spasms Back brace as needed Old X-ray of lumbar spine showed DDD of lumbar spine Referred to PT Increased dose of gabapentin to 300 mg nightly, needs to take it regularly      Relevant Medications   gabapentin (NEURONTIN) 300 MG capsule   cyclobenzaprine (FLEXERIL) 5 MG tablet     Other   MDD (major depressive disorder), recurrent, in partial remission (HCC)    Flowsheet Row Office Visit from 08/15/2022 in [facility]  PHQ-9 Total Score 0     Likely due to chronic medical conditions Well-controlled DC Zoloft as he has stopped it anyways      Relevant Orders   TSH   CBC with Differential/Platelet   Pre-diabetes    HbA1C: 5.8 Was on Ozempic, for weight loss, started by Cardiology at [facility]      Relevant Orders   Hemoglobin A1c   CMP14+EGFR   Hyperlipidemia    Advised to follow low-cholesterol diet Did not tolerate statin      Relevant Orders   Lipid panel   Encounter for general adult medical examination with abnormal findings - Primary    Physical exam as documented. Counseling done  re healthy lifestyle involving commitment to 150 minutes  exercise per week, heart healthy diet, and attaining healthy weight.The importance of adequate sleep also discussed. Changes in health habits are decided on by the patient with goals and time frames  set for achieving them. Immunization and cancer screening needs are specifically addressed at this visit.      Prostate cancer screening    Ordered PSA after discussing its limitations for prostate cancer screening, including false positive results leading to additional investigations.      Relevant Orders   PSA   Other Visit Diagnoses     Vitamin D deficiency       Relevant Orders   VITAMIN D 25 Hydroxy (Vit-D Deficiency, Fractures)       Meds ordered this encounter  Medications   gabapentin (NEURONTIN) 300 MG capsule    Sig: Take 1 capsule (300 mg total) by mouth at bedtime.    Dispense:  90 capsule    Refill:  1   cyclobenzaprine (FLEXERIL) 5 MG tablet    Sig: Take 1 tablet (5 mg total) by mouth 2 (two) times daily as needed for muscle spasms.    Dispense:  30 tablet    Refill:  1   sennosides-docusate sodium (SENOKOT-S) 8.6-50 MG tablet    Sig: Take 1 tablet by mouth daily.    Dispense:  30 tablet    Refill:  3    Follow-up: Return in about 4 months (around 08/10/2023) for Back pain and constipation.    Anabel Halon, MD

## 2023-04-10 NOTE — Assessment & Plan Note (Signed)
Advised to follow low-cholesterol diet Did not tolerate statin

## 2023-04-10 NOTE — Assessment & Plan Note (Signed)
Likely worse due to Ozempic Advised to start Senokot for constipation

## 2023-04-10 NOTE — Assessment & Plan Note (Signed)
Ordered PSA after discussing its limitations for prostate cancer screening, including false positive results leading to additional investigations. 

## 2023-04-10 NOTE — Assessment & Plan Note (Signed)
Recent echo at [facility] showed normal LVEF and pulmonary HTN. Currently appears euvolemic On Farxiga, spironolactone and Lasix Was placed on Ozempic by cardiology, but he has chronic fatigue with it - advised to follow [place], frequent meals

## 2023-04-10 NOTE — Assessment & Plan Note (Signed)
Flowsheet Row Office Visit from 08/15/2022 in [facility]  PHQ-9 Total Score 0      Likely due to chronic medical conditions Well-controlled DC Zoloft as he has stopped it anyways

## 2023-04-10 NOTE — Assessment & Plan Note (Signed)
HbA1C: 5.8 Was on Ozempic, for weight loss, started by Cardiology at [facility] 

## 2023-04-10 NOTE — Assessment & Plan Note (Signed)
BP Readings from Last 1 Encounters:  04/10/23 108/71   Well-controlled with amlodipine Counseled for compliance with the medications Advised DASH diet and moderate exercise/walking as tolerated

## 2023-04-10 NOTE — Assessment & Plan Note (Signed)
Chronic, intermittent, worse in the morning No red flags currently Advised to perform simple back stretching exercises Naproxen as needed Flexeril as needed for muscle spasms Back brace as needed Old X-ray of lumbar spine showed DDD of lumbar spine Referred to PT Increased dose of gabapentin to 300 mg nightly, needs to take it regularly

## 2023-04-10 NOTE — Assessment & Plan Note (Signed)
On Trelegy and as needed albuterol On Daliresp Followed by pulmonology- Dr. Mannam Was referred to [facility] for lung reduction surgery 

## 2023-04-10 NOTE — Assessment & Plan Note (Signed)

## 2023-04-15 ENCOUNTER — Encounter: Payer: Self-pay | Admitting: Emergency Medicine

## 2023-04-15 ENCOUNTER — Other Ambulatory Visit: Payer: Self-pay

## 2023-04-15 ENCOUNTER — Ambulatory Visit
Admission: EM | Admit: 2023-04-15 | Discharge: 2023-04-15 | Disposition: A | Discharge status: Home / Self Care | Payer: 59 | Attending: Nurse Practitioner | Admitting: Nurse Practitioner

## 2023-04-15 DIAGNOSIS — W57XXXA Bitten or stung by nonvenomous insect and other nonvenomous arthropods, initial encounter: Secondary | ICD-10-CM

## 2023-04-15 DIAGNOSIS — S80861A Insect bite (nonvenomous), right lower leg, initial encounter: Secondary | ICD-10-CM | POA: Diagnosis not present

## 2023-04-15 MED ORDER — TRIAMCINOLONE ACETONIDE 0.1 % EX OINT: 1.0000 | TOPICAL_OINTMENT | Freq: Two times a day (BID) | CUTANEOUS | 0 refills | Status: DC

## 2023-04-15 NOTE — ED Triage Notes (Signed)
Pt reports possible insect bite to right calf x2 days ago. Pt reports site itches and has had increasing redness.

## 2023-04-15 NOTE — ED Provider Notes (Signed)
[facility]    CSN: [id number] Arrival date & time: 04/15/23  1523      History   Chief Complaint Chief Complaint  Patient presents with   Insect Bite    HPI Ricardo Burch is a 62 y.o. male.   Patient presents today with possible insect bite to right calf for the past 2 days.  Reports area is a little bit red and itches.  He denies drainage, fever, nausea/vomiting.  No body aches or chills.  Area is not growing in size.  Patient also denies shortness of breath, cough, congestion in his chest.  His SpO2 is 89% today on room air and he states this is near his normal as he has history of COPD.    Past Medical History:  Diagnosis Date   Allergy    CHF (congestive heart failure) (HCC)    Chronic kidney disease    COPD (chronic obstructive pulmonary disease) (HCC) Dx 2015   Depression    Eczema    since childhood    Erectile dysfunction 08/18/2019   GERD (gastroesophageal reflux disease) 01/25/2020   Hyperlipidemia 01/12/2018   Hypertension    Oxygen deficiency    Screening for HIV (human immunodeficiency virus)    Sleep apnea    CPAP   Substance abuse (HCC)    MJ, cocaine   Testicular failure 07/22/2019    Patient Active Problem List   Diagnosis Date Noted   Prostate cancer screening 04/10/2023   Chronic constipation 04/10/2023   Recurrent acute serous otitis media of left ear 08/15/2022   Excessive ear wax, left 07/22/2022   Need for immunization against influenza 07/09/2022   Encounter for general adult medical examination with abnormal findings 03/05/2022   Chronic fatigue 11/28/2021   DDD (degenerative disc disease), lumbar 11/28/2021   Lung nodule 08/30/2021   Pulmonary hypertension, moderate to severe (HCC) 08/28/2021   GERD (gastroesophageal reflux disease) 01/25/2020   Bloating 01/25/2020   Morbid obesity (HCC) 12/06/2019   Erectile dysfunction 08/18/2019   Chronic diastolic CHF (congestive heart failure) (HCC) 09/23/2018   Hyperlipidemia  01/12/2018   Hypertension 10/14/2017   Aortic atherosclerosis (HCC) 09/15/2017   Pre-diabetes 09/15/2017   Cocaine abuse (HCC) 07/26/2016   MDD (major depressive disorder), recurrent, in partial remission (HCC) 07/26/2016   OSA on CPAP 07/26/2016   Alcohol abuse 06/27/2016   COPD (chronic obstructive pulmonary disease) (HCC) 08/23/2014   Eczema 08/23/2014   Right knee pain 08/23/2014   Tobacco abuse 03/27/2014   Chronic respiratory failure with hypoxia (HCC) 03/27/2014   COPD exacerbation (HCC) 11/19/2013    Past Surgical History:  Procedure Laterality Date   COLONOSCOPY WITH PROPOFOL N/A 06/25/2017   Procedure: COLONOSCOPY WITH PROPOFOL;  Surgeon: Corbin Ade, MD;  Location: [facility];  Service: Endoscopy;  Laterality: N/A;  2:00pm   MULTIPLE EXTRACTIONS WITH ALVEOLOPLASTY N/A 03/22/2016   Procedure: MULTIPLE EXTRACTION WITH ALVEOLOPLASTY;  Surgeon: Ocie Doyne, DDS;  Location: [facility];  Service: Oral Surgery;  Laterality: N/A;   NO PAST SURGERIES     POLYPECTOMY  06/25/2017   Procedure: POLYPECTOMY;  Surgeon: Corbin Ade, MD;  Location: [facility];  Service: Endoscopy;;  colon       Home Medications    Prior to Admission medications   Medication Sig Start Date End Date Taking? Authorizing Provider  triamcinolone ointment (KENALOG) 0.1 % Apply 1 Application topically 2 (two) times daily. Apply sparingly on clean skin up to twice daily as needed for itching 04/15/23  Yes Cathlean Marseilles A, NP  albuterol (VENTOLIN HFA) 108 (90 Base) MCG/ACT inhaler INHALE 1 OR 2 PUFFS BY MOUTH EVERY 6 HOURS AS NEEDED FOR WHEEZING OR SHORTNESS OF BREATH 06/18/22   Mannam, Praveen, MD  amLODipine (NORVASC) 5 MG tablet TAKE 1 TABLET BY MOUTH DAILY 03/25/23   Anabel Halon, MD  benzonatate (TESSALON) 200 MG capsule Take 1 capsule (200 mg total) by mouth 2 (two) times daily as needed for cough. 12/24/22   Anabel Halon, MD  cyclobenzaprine (FLEXERIL) 5 MG tablet Take 1 tablet (5 mg total)  by mouth 2 (two) times daily as needed for muscle spasms. 04/10/23   Patel, Earlie Lou, MD  FARXIGA 10 MG TABS tablet Take 10 mg by mouth daily. 02/25/22   [provider]  fluticasone (FLONASE) 50 MCG/ACT nasal spray INSTILL 2 SPRAYS IN BOTH NOSTRILS TWICE DAILY 06/18/22   Mannam, Colbert Coyer, MD  Fluticasone-Umeclidin-Vilant (TRELEGY ELLIPTA) 200-62.5-25 MCG/ACT AEPB INHALE ONE (1) PUFF BY MOUTH DAILY 02/24/23   Mannam, Praveen, MD  furosemide (LASIX) 20 MG tablet Take 20 mg by mouth daily. 12/25/21   [provider]  gabapentin (NEURONTIN) 300 MG capsule Take 1 capsule (300 mg total) by mouth at bedtime. 04/10/23   Anabel Halon, MD  ketoconazole (NIZORAL) 2 % cream Apply 1 Application topically daily. 02/10/23   Anabel Halon, MD  levocetirizine (XYZAL) 5 MG tablet Take 1 tablet (5 mg total) by mouth every evening. 07/19/22   Gilmore Laroche, FNP  OXYGEN Inhale 3 L into the lungs daily. continuous    [provider]  roflumilast (DALIRESP) 500 MCG TABS tablet TAKE 1 TABLET ( ) BY MOUTH DAILY 10/25/22   Mannam, Praveen, MD  sennosides-docusate sodium (SENOKOT-S) 8.6-50 MG tablet Take 1 tablet by mouth daily. 04/10/23   Anabel Halon, MD  spironolactone (ALDACTONE) 25 MG tablet Take 25 mg by mouth daily. 09/17/22   [provider]    Family History Family History  Problem Relation Age of Onset   Arthritis Mother    Hypertension Mother    Stroke Mother        age 45   Cerebral aneurysm Father    Alcohol abuse Father    Cancer Brother    Diabetes Neg Hx    Heart disease Neg Hx     Social History Social History   Tobacco Use   Smoking status: Former    Packs/day: 1.00    Years: 38.00    Additional pack years: 0.00    Total pack years: 38.00    Types: Cigarettes    Quit date: 02/21/2020    Years since quitting: 3.1   Smokeless tobacco: Never  Vaping Use   Vaping Use: Never used  Substance Use Topics   Alcohol use: Yes    Alcohol/week: 1.0 standard  drink of alcohol    Types: 1 Cans of beer per week    Comment: Socially x 1/month currently (as of 01/25/20); previously: occassionally: weekends, 6-pack or less on a day; or half pint of liquior   Drug use: Not Currently    Types: Marijuana, Cocaine    Comment: 01/25/20-marijuana few weeks ago, no cocaine     Allergies   Apresoline [hydralazine]   Review of Systems Review of Systems Per HPI  Physical Exam Triage Vital Signs ED Triage Vitals  Enc Vitals Group     BP 04/15/23 1537 99/65     Pulse Rate 04/15/23 1537 98     Resp 04/15/23  1537 20     Temp 04/15/23 1537 98.4 F (36.9 C)     Temp Source 04/15/23 1537 Oral     SpO2 04/15/23 1537 (!) 89 %     Weight --      Height --      Head Circumference --      Peak Flow --      Pain Score 04/15/23 1538 0     Pain Loc --      Pain Edu? --      Excl. in GC? --    No data found.  Updated Vital Signs BP 99/65 (BP Location: Right Arm)   Pulse 98   Temp 98.4 F (36.9 C) (Oral)   Resp 20   SpO2 (!) 89% Comment: denies any shortness of breath. hx of copd. NAD noted.  Visual Acuity Right Eye Distance:   Left Eye Distance:   Bilateral Distance:    Right Eye Near:   Left Eye Near:    Bilateral Near:     Physical Exam Vitals and nursing note reviewed.  Constitutional:      General: He is not in acute distress.    Appearance: Normal appearance. He is not toxic-appearing.  Pulmonary:     Effort: Pulmonary effort is normal. No respiratory distress.  Musculoskeletal:       Legs:     Comments: Approximately 1 cm x 0.5 cm area of ecchymosis to posterior calf.  There is an indurated area around the ecchymosis.  There is a pinpoint scab in the center consistent with insect bite.  No warmth, active drainage, or tenderness to touch.  There is excoriation around the insect bite.  Skin:    General: Skin is warm and dry.     Capillary Refill: Capillary refill takes less than 2 seconds.     Coloration: Skin is not jaundiced or  pale.     Findings: Erythema present. No rash.  Neurological:     Mental Status: He is alert and oriented to person, place, and time.  Psychiatric:        Behavior: Behavior is cooperative.      UC Treatments / Results  Labs (all labs ordered are listed, but only abnormal results are displayed) Labs Reviewed - No data to display  EKG   Radiology No results found.  Procedures Procedures (including critical care time)  Medications Ordered in UC Medications - No data to display  Initial Impression / Assessment and Plan / UC Course  I have reviewed the triage vital signs and the nursing notes.  Pertinent labs & imaging results that were available during my care of the patient were reviewed by me and considered in my medical decision making (see chart for details).   Patient is well-appearing and vital signs are baseline for the patient.  As discussed above, SpO2 is 89% on room air which is near the patient's baseline.  His blood pressure is also a little bit soft, however this is near his baseline.  1. Insect bite of right lower leg, initial encounter Treat with triamcinolone ointment up to twice daily as needed for itching-apply to clean skin Wound care discussed Return and ER precautions discussed with patient  The patient was given the opportunity to ask questions.  All questions answered to their satisfaction.  The patient is in agreement to this plan.    Final Clinical Impressions(s) / UC Diagnoses   Final diagnoses:  Insect bite of right lower leg, initial encounter  Discharge Instructions      The insect bite does not look infected today.  You can use the triamcinolone ointment up to twice daily as needed to help with itching.  Seek care if symptoms persist or worsen despite treatment.    ED Prescriptions     Medication Sig Dispense Auth. Provider   triamcinolone ointment (KENALOG) 0.1 % Apply 1 Application topically 2 (two) times daily. Apply sparingly  on clean skin up to twice daily as needed for itching 15 g Valentino Nose, NP      PDMP not reviewed this encounter.   Valentino Nose, NP 04/15/23 1654

## 2023-04-15 NOTE — Discharge Instructions (Signed)
The insect bite does not look infected today.  You can use the triamcinolone ointment up to twice daily as needed to help with itching.  Seek care if symptoms persist or worsen despite treatment.

## 2023-05-06 DIAGNOSIS — J449 Chronic obstructive pulmonary disease, unspecified: Secondary | ICD-10-CM | POA: Diagnosis not present

## 2023-05-19 DIAGNOSIS — E782 Mixed hyperlipidemia: Secondary | ICD-10-CM | POA: Diagnosis not present

## 2023-05-19 DIAGNOSIS — E559 Vitamin D deficiency, unspecified: Secondary | ICD-10-CM | POA: Diagnosis not present

## 2023-05-19 DIAGNOSIS — I10 Essential (primary) hypertension: Secondary | ICD-10-CM | POA: Diagnosis not present

## 2023-05-19 DIAGNOSIS — R7303 Prediabetes: Secondary | ICD-10-CM | POA: Diagnosis not present

## 2023-05-20 LAB — CMP14+EGFR
ALT: 18 IU/L (ref 0–44)
AST: 20 IU/L (ref 0–40)
Albumin: 4.3 g/dL (ref 3.8–4.9)
Alkaline Phosphatase: 107 IU/L (ref 44–121)
BUN/Creatinine Ratio: 13 (ref 9–20)
BUN: 13 mg/dL (ref 6–24)
Bilirubin Total: 1.1 mg/dL (ref 0.0–1.2)
CO2: 25 mmol/L (ref 20–29)
Calcium: 9.1 mg/dL (ref 8.7–10.2)
Chloride: 98 mmol/L (ref 96–106)
Creatinine, Ser: 0.99 mg/dL (ref 0.76–1.27)
Globulin, Total: 2.8 g/dL (ref 1.5–4.5)
Glucose: 82 mg/dL (ref 70–99)
Potassium: 4.7 mmol/L (ref 3.5–5.2)
Sodium: 138 mmol/L (ref 134–144)
Total Protein: 7.1 g/dL (ref 6.0–8.5)
eGFR: 88 mL/min/{1.73_m2} (ref >59)

## 2023-05-20 LAB — CBC WITH DIFFERENTIAL/PLATELET
Basophils Absolute: 0 10*3/uL (ref 0.0–0.2)
Basos: 1 %
EOS (ABSOLUTE): 0.1 10*3/uL (ref 0.0–0.4)
Eos: 1 %
Hematocrit: 48.4 % (ref 37.5–51.0)
Hemoglobin: 16.1 g/dL (ref 13.0–17.7)
Immature Grans (Abs): 0 10*3/uL (ref 0.0–0.1)
Immature Granulocytes: 0 %
Lymphocytes Absolute: 2.3 10*3/uL (ref 0.7–3.1)
Lymphs: 31 %
MCH: 31 pg (ref 26.6–33.0)
MCHC: 33.3 g/dL (ref 31.5–35.7)
MCV: 93 fL (ref 79–97)
Monocytes Absolute: 0.8 10*3/uL (ref 0.1–0.9)
Monocytes: 10 %
Neutrophils Absolute: 4.2 10*3/uL (ref 1.4–7.0)
Neutrophils: 57 %
Platelets: 197 10*3/uL (ref 150–450)
RBC: 5.2 x10E6/uL (ref 4.14–5.80)
RDW: 13.2 % (ref 11.6–15.4)
WBC: 7.4 10*3/uL (ref 3.4–10.8)

## 2023-05-20 LAB — LIPID PANEL
Chol/HDL Ratio: 2.9 ratio (ref 0.0–5.0)
Cholesterol, Total: 150 mg/dL (ref 100–199)
HDL: 52 mg/dL (ref >39)
LDL Chol Calc (NIH): 87 mg/dL (ref 0–99)
Triglycerides: 49 mg/dL (ref 0–149)
VLDL Cholesterol Cal: 11 mg/dL (ref 5–40)

## 2023-05-20 LAB — HEMOGLOBIN A1C
Est. average glucose Bld gHb Est-mCnc: 117 mg/dL
Hgb A1c MFr Bld: 5.7 % — ABNORMAL HIGH (ref 4.8–5.6)

## 2023-05-20 LAB — VITAMIN D 25 HYDROXY (VIT D DEFICIENCY, FRACTURES): Vit D, 25-Hydroxy: 32 ng/mL (ref 30.0–100.0)

## 2023-05-20 LAB — TSH: TSH: 1.09 u[IU]/mL (ref 0.450–4.500)

## 2023-05-20 LAB — PSA: Prostate Specific Ag, Serum: 0.4 ng/mL (ref 0.0–4.0)

## 2023-05-23 ENCOUNTER — Other Ambulatory Visit: Payer: Self-pay | Admitting: Pulmonary Disease

## 2023-05-24 ENCOUNTER — Other Ambulatory Visit: Payer: Self-pay | Admitting: Pulmonary Disease

## 2023-06-04 DIAGNOSIS — J449 Chronic obstructive pulmonary disease, unspecified: Secondary | ICD-10-CM | POA: Diagnosis not present

## 2023-06-04 DIAGNOSIS — G4733 Obstructive sleep apnea (adult) (pediatric): Secondary | ICD-10-CM | POA: Diagnosis not present

## 2023-06-06 DIAGNOSIS — J449 Chronic obstructive pulmonary disease, unspecified: Secondary | ICD-10-CM | POA: Diagnosis not present

## 2023-06-23 DIAGNOSIS — G4733 Obstructive sleep apnea (adult) (pediatric): Secondary | ICD-10-CM | POA: Diagnosis not present

## 2023-06-23 DIAGNOSIS — J449 Chronic obstructive pulmonary disease, unspecified: Secondary | ICD-10-CM | POA: Diagnosis not present

## 2023-07-02 DIAGNOSIS — I272 Pulmonary hypertension, unspecified: Secondary | ICD-10-CM | POA: Diagnosis not present

## 2023-07-02 DIAGNOSIS — J432 Centrilobular emphysema: Secondary | ICD-10-CM | POA: Diagnosis not present

## 2023-07-02 DIAGNOSIS — G4733 Obstructive sleep apnea (adult) (pediatric): Secondary | ICD-10-CM | POA: Diagnosis not present

## 2023-07-07 DIAGNOSIS — J449 Chronic obstructive pulmonary disease, unspecified: Secondary | ICD-10-CM | POA: Diagnosis not present

## 2023-07-09 ENCOUNTER — Ambulatory Visit: Payer: 59 | Admitting: Pulmonary Disease

## 2023-08-06 DIAGNOSIS — J449 Chronic obstructive pulmonary disease, unspecified: Secondary | ICD-10-CM | POA: Diagnosis not present

## 2023-08-11 ENCOUNTER — Ambulatory Visit: Payer: 59 | Admitting: Internal Medicine

## 2023-08-12 ENCOUNTER — Encounter: Payer: Self-pay | Admitting: Internal Medicine

## 2023-08-12 ENCOUNTER — Ambulatory Visit (INDEPENDENT_AMBULATORY_CARE_PROVIDER_SITE_OTHER): Payer: 59 | Admitting: Internal Medicine

## 2023-08-12 VITALS — BP 125/72 | HR 87 | Ht 69.0 in | Wt 232.8 lb

## 2023-08-12 DIAGNOSIS — K5909 Other constipation: Secondary | ICD-10-CM | POA: Diagnosis not present

## 2023-08-12 DIAGNOSIS — I272 Pulmonary hypertension, unspecified: Secondary | ICD-10-CM

## 2023-08-12 DIAGNOSIS — I10 Essential (primary) hypertension: Secondary | ICD-10-CM

## 2023-08-12 DIAGNOSIS — R7303 Prediabetes: Secondary | ICD-10-CM

## 2023-08-12 DIAGNOSIS — J432 Centrilobular emphysema: Secondary | ICD-10-CM

## 2023-08-12 DIAGNOSIS — N529 Male erectile dysfunction, unspecified: Secondary | ICD-10-CM

## 2023-08-12 DIAGNOSIS — M51362 Other intervertebral disc degeneration, lumbar region with discogenic back pain and lower extremity pain: Secondary | ICD-10-CM

## 2023-08-12 MED ORDER — SILDENAFIL CITRATE 50 MG PO TABS: 50.0000 mg | ORAL_TABLET | Freq: Every day | ORAL | 1 refills | Status: AC | PRN

## 2023-08-12 NOTE — Assessment & Plan Note (Signed)
Lab Results  Component Value Date   HGBA1C 5.7 (H) 05/19/2023    Was on Ozempic, for weight loss, started by Cardiology at [facility]

## 2023-08-12 NOTE — Assessment & Plan Note (Signed)
On Trelegy and as needed albuterol On Daliresp Followed by pulmonology- Dr. Isaiah Serge Was referred to [facility] for lung reduction surgery - but since his symptoms are improved with weight loss, it was deemed that he would not benefit any further

## 2023-08-12 NOTE — Assessment & Plan Note (Signed)
Prescribed Viagra 50 mg as needed, can also be beneficial for pulmonary hypertension

## 2023-08-12 NOTE — Assessment & Plan Note (Addendum)
Noted on echo Likely due to COPD Followed by pulmonology and pulmonary HTN clinic at [facility] euvolemic

## 2023-08-12 NOTE — Assessment & Plan Note (Addendum)
Was worse due to Ozempic - now improved with Senokot S Advised to continue Senokot-S for constipation

## 2023-08-12 NOTE — Assessment & Plan Note (Addendum)
Chronic, intermittent, worse in the morning No red flags currently Advised to perform simple back stretching exercises Flexeril as needed for muscle spasms Back brace as needed Old X-ray of lumbar spine showed DDD of lumbar spine Had referred to PT DC gabapentin to 300 mg nightly as he does not prefer to continue now

## 2023-08-12 NOTE — Progress Notes (Signed)
Established Patient Office Visit  Subjective:  Patient ID: Ricardo Burch, male    DOB: 1961/02/04  Age: 62 y.o. MRN: [id number]  CC:  Chief Complaint  Patient presents with   Back Pain    Four month follow up    Constipation    Four month follow up    Nasal Congestion    Nasal and chest congestion in the morning     HPI Ricardo Burch is a 62 y.o. male with past medical history of HTN, COPD, chronic hypoxic respiratory failure, OSA, GERD and chronic fatigue who presents for f/u of his chronic medical conditions.  HTN: BP is well-controlled. Takes medications regularly. Patient denies headache, dizziness, chest pain, or palpitations.   Chronic hypoxic respiratory failure and COPD: He uses Trelegy and as needed albuterol for COPD and follows up with Dr. Isaiah Serge.  He reports chronic nasal congestion and mild chest tightness with coughing, but usually improves with Mucinex.  Denies any hemoptysis or recent worsening of dyspnea or wheezing.  Denies any fever or chills.   CHF and pulmonary HTN: His echo showed pulmonary HTN, for which he was referred to pulmonology HTN clinic at [facility]. He currently uses home O2 at 3 LPM PRN.  He has chronic dyspnea, but has improved lately with weight loss.  He was placed on Farxiga for HFpEF and Ozempic for weight loss benefit by cardiology clinic at [facility]. He has lost about 27 lbs since 05/23.  He reports that he feels fatigued and constipated with Ozempic.  His constipation has improved with Senokot S.  His back pain has improved lately. He has stopped taking Gabapentin.  He reports erectile dysfunction, and has used Viagra in the past.  Past Medical History:  Diagnosis Date   Allergy    CHF (congestive heart failure) (HCC)    Chronic kidney disease    COPD (chronic obstructive pulmonary disease) (HCC) Dx 2015   Depression    Eczema    since childhood    Erectile dysfunction 08/18/2019   GERD (gastroesophageal reflux disease) 01/25/2020    Hyperlipidemia 01/12/2018   Hypertension    Oxygen deficiency    Screening for HIV (human immunodeficiency virus)    Sleep apnea    CPAP   Substance abuse (HCC)    MJ, cocaine   Testicular failure 07/22/2019    Past Surgical History:  Procedure Laterality Date   COLONOSCOPY WITH PROPOFOL N/A 06/25/2017   Procedure: COLONOSCOPY WITH PROPOFOL;  Surgeon: Corbin Ade, MD;  Location: [facility];  Service: Endoscopy;  Laterality: N/A;  2:00pm   MULTIPLE EXTRACTIONS WITH ALVEOLOPLASTY N/A 03/22/2016   Procedure: MULTIPLE EXTRACTION WITH ALVEOLOPLASTY;  Surgeon: Ocie Doyne, DDS;  Location: [facility] OR;  Service: Oral Surgery;  Laterality: N/A;   NO PAST SURGERIES     POLYPECTOMY  06/25/2017   Procedure: POLYPECTOMY;  Surgeon: Corbin Ade, MD;  Location: [facility];  Service: Endoscopy;;  colon    Family History  Problem Relation Age of Onset   Arthritis Mother    Hypertension Mother    Stroke Mother        age 39   Cerebral aneurysm Father    Alcohol abuse Father    Cancer Brother    Diabetes Neg Hx    Heart disease Neg Hx     Social History   Socioeconomic History   Marital status: Single    Spouse name: Not on file   Number of children: 5   Years  of education: 12   Highest education level: Associate degree: occupational, Scientist, product/process development, or vocational program  Occupational History   Occupation: Unemployed     Comment: car wash details  Tobacco Use   Smoking status: Former    Current packs/day: 0.00    Average packs/day: 1 pack/day for 38.0 years (38.0 ttl pk-yrs)    Types: Cigarettes    Start date: 02/20/1982    Quit date: 02/21/2020    Years since quitting: 3.4   Smokeless tobacco: Never  Vaping Use   Vaping status: Never Used  Substance and Sexual Activity   Alcohol use: Yes    Alcohol/week: 1.0 standard drink of alcohol    Types: 1 Cans of beer per week    Comment: Socially x 1/month currently (as of 01/25/20); previously: occassionally: weekends, 6-pack or less  on a day; or half pint of liquior   Drug use: Not Currently    Types: Marijuana, Cocaine    Comment: 01/25/20-marijuana few weeks ago, no cocaine   Sexual activity: Never  Other Topics Concern   Not on file  Social History Narrative    Lives alone.    5 daughters 72, 25 yo twins, eldest 2 are married live in [place]. Retired.Girlfriend works in Programmer, applications.   Social Determinants of Health   Financial Resource Strain: Low Risk  (08/07/2023)   Overall Financial Resource Strain (CARDIA)    Difficulty of Paying Living Expenses: Not very hard  Food Insecurity: Food Insecurity Present (08/07/2023)   Hunger Vital Sign    Worried About Running Out of Food in the Last Year: Sometimes true    Ran Out of Food in the Last Year: Sometimes true  Transportation Needs: Unmet Transportation Needs (08/07/2023)   PRAPARE - Administrator, Civil Service (Medical): Yes    Lack of Transportation (Non-Medical): Yes  Physical Activity: Sufficiently Active (08/07/2023)   Exercise Vital Sign    Days of Exercise per Week: 4 days    Minutes of Exercise per Session: 60 min  Stress: No Stress Concern Present (08/07/2023)   [employer] of Occupational Health - Occupational Stress Questionnaire    Feeling of Stress : Only a little  Social Connections: Moderately Integrated (08/07/2023)   Social Connection and Isolation Panel [NHANES]    Frequency of Communication with Friends and Family: Twice a week    Frequency of Social Gatherings with Friends and Family: Twice a week    Attends Religious Services: More than 4 times per year    Active Member of [employer] or Organizations: No    Attends Banker Meetings: Never    Marital Status: Married  Catering manager Violence: Not At Risk (01/02/2023)   Humiliation, Afraid, Rape, and Kick questionnaire    Fear of Current or Ex-Partner: No    Emotionally Abused: No    Physically Abused: No    Sexually Abused: No    Outpatient Medications  Prior to Visit  Medication Sig Dispense Refill   albuterol (VENTOLIN HFA) 108 (90 Base) MCG/ACT inhaler INHALE 1-2 PUFFS BY MOUTH EVERY 6 HOURS AS NEEDED FOR WHEEZING OR FOR SHORTNESS OF BREATH 8.5 g 10   amLODipine (NORVASC) 5 MG tablet TAKE 1 TABLET BY MOUTH DAILY 30 tablet 10   benzonatate (TESSALON) 200 MG capsule Take 1 capsule (200 mg total) by mouth 2 (two) times daily as needed for cough. 20 capsule 0   cyclobenzaprine (FLEXERIL) 5 MG tablet Take 1 tablet (5 mg total) by mouth 2 (  two) times daily as needed for muscle spasms. 30 tablet 1   FARXIGA 10 MG TABS tablet Take 10 mg by mouth daily.     fluticasone (FLONASE) 50 MCG/ACT nasal spray INSTILL TWO (2) SPRAYS IN EACH NOSTRIL TWICE DAILY 16 g 10   Fluticasone-Umeclidin-Vilant (TRELEGY ELLIPTA) 200-62.5-25 MCG/ACT AEPB INHALE ONE (1) PUFF BY MOUTH DAILY 60 each 5   furosemide (LASIX) 20 MG tablet Take 20 mg by mouth daily.     ketoconazole (NIZORAL) 2 % cream Apply 1 Application topically daily. 30 g 0   levocetirizine (XYZAL) 5 MG tablet Take 1 tablet (5 mg total) by mouth every evening. 30 tablet 0   OXYGEN Inhale 3 L into the lungs daily. continuous     roflumilast (DALIRESP) 500 MCG TABS tablet TAKE 1 TABLET ( ) BY MOUTH DAILY 30 tablet 10   sennosides-docusate sodium (SENOKOT-S) 8.6-50 MG tablet Take 1 tablet by mouth daily. 30 tablet 3   spironolactone (ALDACTONE) 25 MG tablet Take 25 mg by mouth daily.     gabapentin (NEURONTIN) 300 MG capsule Take 1 capsule (300 mg total) by mouth at bedtime. 90 capsule 1   triamcinolone ointment (KENALOG) 0.1 % Apply 1 Application topically 2 (two) times daily. Apply sparingly on clean skin up to twice daily as needed for itching 15 g 0   No facility-administered medications prior to visit.    Allergies  Allergen Reactions   Apresoline [Hydralazine] Other (See Comments)    Headache     ROS Review of Systems  Constitutional:  Positive for fatigue. Negative for chills and fever.   HENT:  Positive for congestion. Negative for sore throat.   Eyes:  Negative for pain and discharge.  Respiratory:  Positive for cough and shortness of breath.   Cardiovascular:  Negative for chest pain and palpitations.  Gastrointestinal:  Negative for diarrhea, nausea and vomiting.  Endocrine: Negative for polydipsia and polyuria.  Genitourinary:  Negative for dysuria and hematuria.  Musculoskeletal:  Positive for back pain. Negative for neck pain and neck stiffness.  Skin:  Negative for rash.  Neurological:  Negative for dizziness, weakness, numbness and headaches.  Psychiatric/Behavioral:  Negative for agitation and behavioral problems.       Objective:    Physical Exam Vitals reviewed.  Constitutional:      General: He is not in acute distress.    Appearance: He is not diaphoretic.  HENT:     Head: Normocephalic and atraumatic.     Nose: Congestion present.     Mouth/Throat:     Mouth: Mucous membranes are moist.  Eyes:     General: No scleral icterus.    Extraocular Movements: Extraocular movements intact.  Cardiovascular:     Rate and Rhythm: Normal rate and regular rhythm.     Pulses: Normal pulses.     Heart sounds: Normal heart sounds. No murmur heard. Pulmonary:     Breath sounds: Normal breath sounds. No wheezing or rales.     Comments: On home O2 - 3 lpm Musculoskeletal:     Cervical back: Neck supple. No tenderness.     Lumbar back: Tenderness (right paraspinal) present. Decreased range of motion.     Right lower leg: No edema.     Left lower leg: No edema.  Skin:    General: Skin is warm.     Findings: No rash.  Neurological:     General: No focal deficit present.     Mental Status: He is alert and oriented  to person, place, and time.     Sensory: No sensory deficit.     Motor: No weakness.  Psychiatric:        Mood and Affect: Mood normal.        Behavior: Behavior normal.     BP 125/72 (BP Location: Left Arm, Patient Position: Sitting, Cuff  Size: Normal)   Pulse 87   Ht 5\' 9"  (1.753 m)   Wt 232 lb 12.8 oz (105.6 kg)   SpO2 (!) 89%   BMI 34.38 kg/m  Wt Readings from Last 3 Encounters:  08/12/23 232 lb 12.8 oz (105.6 kg)  04/10/23 234 lb 9.6 oz (106.4 kg)  03/28/23 252 lb 3.2 oz (114.4 kg)    Lab Results  Component Value Date   TSH 1.090 05/19/2023   Lab Results  Component Value Date   WBC 7.4 05/19/2023   HGB 16.1 05/19/2023   HCT 48.4 05/19/2023   MCV 93 05/19/2023   PLT 197 05/19/2023   Lab Results  Component Value Date   NA 138 05/19/2023   K 4.7 05/19/2023   CO2 25 05/19/2023   GLUCOSE 82 05/19/2023   BUN 13 05/19/2023   CREATININE 0.99 05/19/2023   BILITOT 1.1 05/19/2023   ALKPHOS 107 05/19/2023   AST 20 05/19/2023   ALT 18 05/19/2023   PROT 7.1 05/19/2023   ALBUMIN 4.3 05/19/2023   CALCIUM 9.1 05/19/2023   ANIONGAP 7 06/11/2022   EGFR 88 05/19/2023   Lab Results  Component Value Date   CHOL 150 05/19/2023   Lab Results  Component Value Date   HDL 52 05/19/2023   Lab Results  Component Value Date   LDLCALC 87 05/19/2023   Lab Results  Component Value Date   TRIG 49 05/19/2023   Lab Results  Component Value Date   CHOLHDL 2.9 05/19/2023   Lab Results  Component Value Date   HGBA1C 5.7 (H) 05/19/2023      Assessment & Plan:   Problem List Items Addressed This Visit       Cardiovascular and Mediastinum   Hypertension    BP Readings from Last 1 Encounters:  08/12/23 125/72   Well-controlled with amlodipine Counseled for compliance with the medications Advised DASH diet and moderate exercise/walking as tolerated      Relevant Medications   sildenafil (VIAGRA) 50 MG tablet   Pulmonary hypertension, moderate to severe (HCC) - Primary    Noted on echo Likely due to COPD Followed by pulmonology and pulmonary HTN clinic at [facility] euvolemic      Relevant Medications   sildenafil (VIAGRA) 50 MG tablet     Respiratory   COPD (chronic obstructive pulmonary  disease) (HCC) (Chronic)    On Trelegy and as needed albuterol On Daliresp Followed by pulmonology- Dr. Isaiah Serge Was referred to [facility] for lung reduction surgery - but since his symptoms are improved with weight loss, it was deemed that he would not benefit any further        Digestive   Chronic constipation    Was worse due to Ozempic - now improved with Senokot S Advised to continue Senokot-S for constipation        Musculoskeletal and Integument   DDD (degenerative disc disease), lumbar    Chronic, intermittent, worse in the morning No red flags currently Advised to perform simple back stretching exercises Flexeril as needed for muscle spasms Back brace as needed Old X-ray of lumbar spine showed DDD of lumbar spine Had referred to  PT DC gabapentin to 300 mg nightly as he does not prefer to continue now        Other   Pre-diabetes    Lab Results  Component Value Date   HGBA1C 5.7 (H) 05/19/2023    Was on Ozempic, for weight loss, started by Cardiology at [facility]      Erectile dysfunction    Prescribed Viagra 50 mg as needed, can also be beneficial for pulmonary hypertension      Relevant Medications   sildenafil (VIAGRA) 50 MG tablet    Meds ordered this encounter  Medications   sildenafil (VIAGRA) 50 MG tablet    Sig: Take 1 tablet (50 mg total) by mouth daily as needed for erectile dysfunction.    Dispense:  30 tablet    Refill:  1    Follow-up: Return in about 6 months (around 02/10/2024) for HTN and COPD.    Anabel Halon, MD

## 2023-08-12 NOTE — Assessment & Plan Note (Signed)
BP Readings from Last 1 Encounters:  08/12/23 125/72   Well-controlled with amlodipine Counseled for compliance with the medications Advised DASH diet and moderate exercise/walking as tolerated

## 2023-08-12 NOTE — Patient Instructions (Signed)
Please continue to take medications as prescribed. ? ?Please continue to follow low carb diet and perform moderate exercise/walking at least 150 mins/week. ?

## 2023-08-13 ENCOUNTER — Encounter: Payer: Self-pay | Admitting: Pulmonary Disease

## 2023-08-13 ENCOUNTER — Ambulatory Visit: Payer: 59 | Admitting: Pulmonary Disease

## 2023-08-13 VITALS — BP 115/76 | HR 79 | Ht 69.0 in | Wt 230.6 lb

## 2023-08-13 DIAGNOSIS — G4733 Obstructive sleep apnea (adult) (pediatric): Secondary | ICD-10-CM

## 2023-08-13 DIAGNOSIS — J449 Chronic obstructive pulmonary disease, unspecified: Secondary | ICD-10-CM

## 2023-08-13 NOTE — Patient Instructions (Signed)
VISIT SUMMARY:  During your visit, we discussed your chronic obstructive pulmonary disease (COPD), sleep apnea, weight management, and your past tobacco use. We also talked about your general health maintenance, your interest in returning to pulmonary rehab, and your request for renewal of your handicap placard.  YOUR PLAN:  -SEVERE COPD: COPD is a chronic lung disease that makes it hard to breathe. You've been doing well since switching from [place] to Trelegy for your COPD. Continue taking Trelegy and Daliresp, and monitor your symptoms.  -OBSTRUCTIVE SLEEP APNEA: Sleep apnea is a sleep disorder where your breathing repeatedly stops and starts. You've reported improvement in your sleep quality with consistent use of your CPAP machine. Continue using it every night.  -WEIGHT MANAGEMENT: You've successfully lost weight, which has improved your breathing and sleep apnea symptoms. Continue taking Ozempic and Farxiga, and keep up with your current exercise and diet regimen.  -TOBACCO USE: You quit smoking in 2021, which is great for your health. Continue to abstain from tobacco.  -GENERAL HEALTH MAINTENANCE: You're up to date on your flu and COVID vaccinations. Consider getting the RSV vaccination, which can help protect you from respiratory syncytial virus, a common cause of lung infections.  -PULMONARY REHABILITATION: Pulmonary rehab can help improve your breathing and quality of life. We'll submit an order for you to return to pulmonary rehab, pending insurance approval.  -HANDICAP PLACARD: You've requested documentation for renewal of your handicap placard. We'll provide the necessary documentation for you.  INSTRUCTIONS:  Your next CT scan is scheduled for August 25, 2023. Continue with your current management and we'll follow up in 6 months. If you have any changes in your symptoms or concerns, please contact us.

## 2023-08-13 NOTE — Progress Notes (Signed)
Ricardo Burch    [id number]    August 06, 1961  Primary Care Physician:Patel, Earlie Lou, MD  Referring Physician: Anabel Halon, MD [address] [place],  [place] [address]  Chief complaint:   Follow up for Severe COPD Severe OSA on autoset  HPI: Mr. Neyer is a 62 y.o. exsmoker with COPD. He's had multiple hospitalizations with COPD, CHF exacerbations in the past.He was hospitalized from 07/26/16-07/30/16 with acute pulmonary edema, acute exacerbation of COPD. He was intubated briefly for hypercarbia.   He has finished the pulmonary rehab and reports improved dyspnea. He has been on a rotating list of inhalers mainly due to his inability to pay co-pay. [name] He has been started on CPAP after a repeat titration study and is tolerating it well. Stopped smoking in March 2021  Interim History: Continues on CPAP Continues on breztri, Daliresp.  Has stable chronic dyspnea on exertion.  He does not like the breztri and wants to change to Trelegy On supplemental oxygen  Referred to [facility] in 2022 for consideration of endobronchial lung volume reduction procedure.  He was evaluated there and referred to pulmonary hypertension clinic as Dr. Carmie Kanner wanted that evaluated before doing the procedure.  Right and left heart cath at [facility] showed pre and postcapillary pulmonary hypertension due to group 3 disease and is being followed by cardiology.  Started on SGLT2 inhibitor for weight loss and has already lost 20 pounds with improvement in breathing  Outpatient Encounter Medications as of 08/13/2023  Medication Sig   albuterol (VENTOLIN HFA) 108 (90 Base) MCG/ACT inhaler INHALE 1-2 PUFFS BY MOUTH EVERY 6 HOURS AS NEEDED FOR WHEEZING OR FOR SHORTNESS OF BREATH   amLODipine (NORVASC) 5 MG tablet TAKE 1 TABLET BY MOUTH DAILY   benzonatate (TESSALON) 200 MG capsule Take 1 capsule (200 mg total) by mouth 2 (two) times daily as needed for cough.   cyclobenzaprine (FLEXERIL) 5 MG tablet Take 1 tablet (5 mg  total) by mouth 2 (two) times daily as needed for muscle spasms.   FARXIGA 10 MG TABS tablet Take 10 mg by mouth daily.   fluticasone (FLONASE) 50 MCG/ACT nasal spray INSTILL TWO (2) SPRAYS IN EACH NOSTRIL TWICE DAILY   Fluticasone-Umeclidin-Vilant (TRELEGY ELLIPTA) 200-62.5-25 MCG/ACT AEPB INHALE ONE (1) PUFF BY MOUTH DAILY   furosemide (LASIX) 20 MG tablet Take 20 mg by mouth daily.   ketoconazole (NIZORAL) 2 % cream Apply 1 Application topically daily.   levocetirizine (XYZAL) 5 MG tablet Take 1 tablet (5 mg total) by mouth every evening.   OXYGEN Inhale 3 L into the lungs daily. continuous   roflumilast (DALIRESP) 500 MCG TABS tablet TAKE 1 TABLET ( ) BY MOUTH DAILY   sennosides-docusate sodium (SENOKOT-S) 8.6-50 MG tablet Take 1 tablet by mouth daily.   sildenafil (VIAGRA) 50 MG tablet Take 1 tablet (50 mg total) by mouth daily as needed for erectile dysfunction.   spironolactone (ALDACTONE) 25 MG tablet Take 25 mg by mouth daily.   No facility-administered encounter medications on file as of 08/13/2023.   Physical Exam: Blood pressure 118/60, pulse 74, temperature 98.2 F (36.8 C), temperature source Oral, height 5\' 8"  (1.727 m), weight 240 lb 6.4 oz (109 kg), SpO2 91 %. Gen:      No acute distress HEENT:  EOMI, sclera anicteric Neck:     No masses; no thyromegaly Lungs:    Clear to auscultation bilaterally; normal respiratory effort CV:  Regular rate and rhythm; no murmurs Abd:      + bowel sounds; soft, non-tender; no palpable masses, no distension Ext:    No edema; adequate peripheral perfusion Skin:      Warm and dry; no rash Neuro: alert and oriented x 3 Psych: normal mood and affect   Data Reviewed: Imaging Screening CT chest 07/14/2019-emphysema, subcentimeter pulmonary nodule.  Atelectasis along the right major fissure.  Screening CT chest 08/21/2020-emphysema, upper lobe predominant.  Stable pulmonary nodules CT chest at [facility] 08/07/2021-stable pulmonary  nodules I have reviewed the images personally.  PFTs  01/16/16 FVC 2.43 [58%], FEV1 1.49 [45%), F/F 61, TLC 81%,DLCO 56% Severe obstruction, moderate reduction in diffusion capacity.  11/27/2020 FVC 2.04 [56%], FEV1 1.08 [37%], F/F 53, TLC 6.36 [99%], DLCO 12.91, 50%] Severe obstruction with air trapping and bronchodilator response.  Severe diffusion defect.  Sleep PSG 10/22/15  evere OSA, AHI 151. Started on an AutoSet CPAP titration study  Initiate CPAP at 12 with 2 L oxygen, fullface mask.   CPAP compliance 09/27/2020 100% compliant with good response   Labs CBC 08/24/17-absolute eosinophil count 254 CBC 06/13/2019-WBC 12.7, eos 1%, absolute eosinophil count 127 Alpha-1 antitrypsin 06/15/2019-134, PI MM  Cardiac Echo (09/18/15) The right ventricular systolic pressure was increased consistent  with moderate pulmonary hypertension.moderate concentric LVH. LVEF 60-65 percent. Grade 2 diastolic dysfunction. RV cavity size severe grade dilated. RV systolic function moderately to severely reduced. PA pressure 56  Assessment:  Severe COPD Continues on Daliresp Change bevespi to Trelegy as he prefers to be on a dry powder inhaler Continue supplemental oxygen Work on weight loss with SGLT2 inhibitor, diet and exercise. Has finished pulmonary rehab twice overall.  Encouraged him to stay active at home.  Severe OSA Stable on CPAP.  Continue with current setting of 12. Encouraged him to use it every day.  Download reviewed with good compliance and response He is inquiring about inspire device but his BMI is too high.  Needs to work on weight loss   Stable lung nodules Continue low-dose screening CTs He had a CT at [facility] at October 2022 which showed stable lung nodules.  Next screening CT is already scheduled in October 2023    Pulmonary hypertension Likely group 3 pulmonary hypertension secondary to COPD, OSA.   Has been seen in pulmonary hypertension clinic in [facility]  maintenance Up-to-date with Covid booster. Up-to-date with flu and pneumonia vaccination  Plan/Recommendations: - Continue Daliresp, supplemental oxygen - Change breztri to Trelegy. - Continue CPAP - Screening CT of the chest to resume in October 2023  Chilton Greathouse MD [facility] 08/13/2023, 1:50 PM  CC: Anabel Halon, MD

## 2023-08-13 NOTE — Progress Notes (Signed)
Ricardo Burch    [id number]    May 12, 1961  Primary Care Physician:Patel, Earlie Lou, MD  Referring Physician: Anabel Halon, MD [address] [place],  [place] [address]  Chief complaint:   Follow up for Severe COPD Severe OSA on autoset   HPI: Ricardo Burch is a 62 y.o. exsmoker with COPD. He's had multiple hospitalizations with COPD, CHF exacerbations in the past.He was hospitalized from 07/26/16-07/30/16 with acute pulmonary edema, acute exacerbation of COPD. He was intubated briefly for hypercarbia.   He has finished the pulmonary rehab and reports improved dyspnea. He has been on a rotating list of inhalers mainly due to his inability to pay co-pay. [name] He has been started on CPAP after a repeat titration study and is tolerating it well. Stopped smoking in March 2021  Referred to [facility] in 2022 for consideration of endobronchial lung volume reduction procedure.  He was evaluated there and referred to pulmonary hypertension clinic as Dr. Carmie Kanner wanted that evaluated before doing the procedure.  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  Interim History: Discussed the use of AI scribe software for clinical note transcription with the patient, who gave verbal consent to proceed.  The patient, with a history of severe COPD and sleep apnea, reports doing well with no recent exacerbations. He was previously on Breztri, but due to throat irritation, he was switched to Trelegy about a year ago. He reports that the Trelegy is less irritating and he is doing better on it. He also continues to take Daliresp daily.  For his sleep apnea, he uses a CPAP machine nightly, which he reports is helping significantly. He has also received his flu and COVID vaccines, but has not yet received the RSV vaccine. He experienced significant side effects when he received the flu and COVID vaccines  simultaneously, including body aches and feeling unwell.  The patient also reports that his oxygen saturation levels fluctuate between 92-96%, but do not drop below 90%. He has been taking weight loss medications, Farxiga and Ozempic, and has successfully lost over 30 pounds, which he reports has improved his breathing and sleep apnea symptoms.  He has a history of smoking, but quit approximately 4-5 years ago. He reports morning congestion, which improves throughout the day. He is also interested in attending pulmonary rehab again.   Outpatient Encounter Medications as of 08/13/2023  Medication Sig   albuterol (VENTOLIN HFA) 108 (90 Base) MCG/ACT inhaler INHALE 1-2 PUFFS BY MOUTH EVERY 6 HOURS AS NEEDED FOR WHEEZING OR FOR SHORTNESS OF BREATH   amLODipine (NORVASC) 5 MG tablet TAKE 1 TABLET BY MOUTH DAILY   benzonatate (TESSALON) 200 MG capsule Take 1 capsule (200 mg total) by mouth 2 (two) times daily as needed for cough.   cyclobenzaprine (FLEXERIL) 5 MG tablet Take 1 tablet (5 mg total) by mouth 2 (two) times daily as needed for muscle spasms.   FARXIGA 10 MG TABS tablet Take 10 mg by mouth daily.   fluticasone (FLONASE) 50 MCG/ACT nasal spray INSTILL TWO (2) SPRAYS IN EACH NOSTRIL TWICE DAILY   Fluticasone-Umeclidin-Vilant (TRELEGY ELLIPTA) 200-62.5-25 MCG/ACT AEPB INHALE ONE (1) PUFF BY MOUTH DAILY   furosemide (LASIX) 20 MG tablet Take 20 mg by mouth daily.   ketoconazole (NIZORAL) 2 % cream Apply 1 Application topically daily.   levocetirizine (XYZAL) 5 MG tablet Take 1 tablet (5  mg total) by mouth every evening.   OXYGEN Inhale 3 L into the lungs daily. continuous   roflumilast (DALIRESP) 500 MCG TABS tablet TAKE 1 TABLET ( ) BY MOUTH DAILY   sennosides-docusate sodium (SENOKOT-S) 8.6-50 MG tablet Take 1 tablet by mouth daily.   sildenafil (VIAGRA) 50 MG tablet Take 1 tablet (50 mg total) by mouth daily as needed for erectile dysfunction.   spironolactone (ALDACTONE) 25 MG tablet  Take 25 mg by mouth daily.   No facility-administered encounter medications on file as of 08/13/2023.   Physical Exam: Blood pressure 115/76, pulse 79, height 5\' 9"  (1.753 m), weight 230 lb 9.6 oz (104.6 kg), SpO2 93%. Gen:      No acute distress HEENT:  EOMI, sclera anicteric Neck:     No masses; no thyromegaly Lungs:    Clear to auscultation bilaterally; normal respiratory effort CV:         Regular rate and rhythm; no murmurs Abd:      + bowel sounds; soft, non-tender; no palpable masses, no distension Ext:    No edema; adequate peripheral perfusion Skin:      Warm and dry; no rash Neuro: alert and oriented x 3 Psych: normal mood and affect   Data Reviewed: Imaging Screening CT chest 07/14/2019-emphysema, subcentimeter pulmonary nodule.  Atelectasis along the right major fissure.  Screening CT chest 08/21/2020-emphysema, upper lobe predominant.  Stable pulmonary nodules CT chest at [facility] 08/07/2021-stable pulmonary nodules Screening CT chest 08/23/2022-emphysema, stable pulmonary nodules measuring 5.1 mm I have reviewed the images personally.  PFTs  01/16/16 FVC 2.43 [58%], FEV1 1.49 [45%), F/F 61, TLC 81%,DLCO 56% Severe obstruction, moderate reduction in diffusion capacity.  11/27/2020 FVC 2.04 [56%], FEV1 1.08 [37%], F/F 53, TLC 6.36 [99%], DLCO 12.91, 50%] Severe obstruction with air trapping and bronchodilator response.  Severe diffusion defect.  Sleep PSG 10/22/15  evere OSA, AHI 151. Started on an AutoSet CPAP titration study  Initiate CPAP at 12 with 2 L oxygen, fullface mask.   CPAP compliance 09/27/2020 100% compliant with good response   Labs CBC 08/24/17-absolute eosinophil count 254 CBC 06/13/2019-WBC 12.7, eos 1%, absolute eosinophil count 127 Alpha-1 antitrypsin 06/15/2019-134, PI MM  Cardiac Echo (09/18/15) The right ventricular systolic pressure was increased consistent  with moderate pulmonary hypertension.moderate concentric LVH. LVEF 60-65 percent.  Grade 2 diastolic dysfunction. RV cavity size severe grade dilated. RV systolic function moderately to severely reduced. PA pressure 56  Assessment:  Severe COPD Improved symptoms since switching from Breztri to Trelegy due to throat irritation. Continues to take Daliresp. Reports morning congestion that improves throughout the day. -Continue Trelegy and Daliresp. -Continue monitoring symptoms and report any changes.  Obstructive Sleep Apnea Reports consistent use of CPAP and improvement in sleep quality. On setting of 12. -Continue nightly CPAP use.  Weight Management Reports successful weight loss from 263 lbs to 230 lbs with the use of Ozempic and [place]. Noted improvement in breathing and sleep apnea symptoms. -Continue Ozempic and Farxiga. -Continue current exercise and diet regimen. -He is inquiring about inspire device but his BMI is too high.  Needs to work on weight loss  Pulmonary hypertension Likely group 3 pulmonary hypertension secondary to COPD, OSA.   Has been seen in pulmonary hypertension clinic in [facility]  Tobacco Use Quit smoking in 2021, no current use. -Continue abstinence from tobacco.  General Health Maintenance Up to date on flu and COVID vaccinations. Recommended RSV vaccination due to COPD. -Consider RSV vaccination.  Pulmonary Rehabilitation Expressed interest in  returning to pulmonary rehab. Last attended a significant time ago. -Submit order for pulmonary rehab, pending insurance approval.  Handicap Placard Requested documentation for renewal of handicap placard. -Provide necessary documentation for handicap placard renewal.  Follow-up Next CT scan scheduled for 08/25/2023. No current concerns. -Continue current management and follow-up in 6 months.   Health maintenance Up-to-date with Covid booster. Up-to-date with flu and pneumonia vaccination  Plan/Recommendations: Continue Trelegy, Daliresp, supplemental oxygen Continue CPAP Pulmonary  rehabilitation Annual CT chest  Chilton Greathouse MD [facility] 08/13/2023, 2:00 PM  CC: Anabel Halon, MD

## 2023-08-15 ENCOUNTER — Ambulatory Visit: Payer: 59 | Admitting: Pulmonary Disease

## 2023-08-21 ENCOUNTER — Other Ambulatory Visit: Payer: Self-pay | Admitting: Pulmonary Disease

## 2023-08-25 ENCOUNTER — Ambulatory Visit (HOSPITAL_COMMUNITY)
Admission: RE | Admit: 2023-08-25 | Discharge: 2023-08-25 | Disposition: A | Discharge status: Home / Self Care | Payer: 59 | Source: Ambulatory Visit | Attending: *Deleted | Admitting: *Deleted

## 2023-08-25 DIAGNOSIS — Z122 Encounter for screening for malignant neoplasm of respiratory organs: Secondary | ICD-10-CM | POA: Diagnosis not present

## 2023-08-25 DIAGNOSIS — F17210 Nicotine dependence, cigarettes, uncomplicated: Secondary | ICD-10-CM

## 2023-08-25 DIAGNOSIS — Z87891 Personal history of nicotine dependence: Secondary | ICD-10-CM | POA: Insufficient documentation

## 2023-09-02 DIAGNOSIS — G4733 Obstructive sleep apnea (adult) (pediatric): Secondary | ICD-10-CM | POA: Diagnosis not present

## 2023-09-02 DIAGNOSIS — J449 Chronic obstructive pulmonary disease, unspecified: Secondary | ICD-10-CM | POA: Diagnosis not present

## 2023-09-06 DIAGNOSIS — J449 Chronic obstructive pulmonary disease, unspecified: Secondary | ICD-10-CM | POA: Diagnosis not present

## 2023-09-10 DIAGNOSIS — I5032 Chronic diastolic (congestive) heart failure: Secondary | ICD-10-CM | POA: Diagnosis not present

## 2023-09-11 ENCOUNTER — Other Ambulatory Visit: Payer: Self-pay | Admitting: Acute Care

## 2023-09-11 DIAGNOSIS — Z87891 Personal history of nicotine dependence: Secondary | ICD-10-CM

## 2023-09-11 DIAGNOSIS — Z122 Encounter for screening for malignant neoplasm of respiratory organs: Secondary | ICD-10-CM

## 2023-09-19 ENCOUNTER — Other Ambulatory Visit: Payer: Self-pay | Admitting: Pulmonary Disease

## 2023-09-19 DIAGNOSIS — J438 Other emphysema: Secondary | ICD-10-CM

## 2023-09-23 ENCOUNTER — Telehealth: Payer: Self-pay

## 2023-09-23 NOTE — Telephone Encounter (Signed)
Copied from CRM [id number]. Topic: Clinical - Medical Advice >> Sep 23, 2023  1:24 PM Judeth Cornfield R wrote: Reason for CRM: PT Is asking if Dr.Patel will prescribe him some medication for his cough,phlegm,and running nose, PT states because of his COPD he cannot take over the counter medication.

## 2023-09-24 ENCOUNTER — Telehealth (INDEPENDENT_AMBULATORY_CARE_PROVIDER_SITE_OTHER): Payer: 59 | Admitting: Internal Medicine

## 2023-09-24 ENCOUNTER — Encounter: Payer: Self-pay | Admitting: Internal Medicine

## 2023-09-24 DIAGNOSIS — J441 Chronic obstructive pulmonary disease with (acute) exacerbation: Secondary | ICD-10-CM

## 2023-09-24 MED ORDER — METHYLPREDNISOLONE 4 MG PO TBPK: ORAL_TABLET | ORAL | 0 refills | Status: DC

## 2023-09-24 MED ORDER — AZITHROMYCIN 250 MG PO TABS: ORAL_TABLET | ORAL | 0 refills | Status: DC

## 2023-09-24 NOTE — Patient Instructions (Signed)
Please check home COVID test and contact us if it is positive.  If home COVID test is negative, you can start taking azithromycin and prednisone as prescribed.  Please continue using Flonase for nasal congestion.  Continue using Trelegy regularly and albuterol as needed for shortness of breath or wheezing.

## 2023-09-24 NOTE — Assessment & Plan Note (Signed)
Recent worsening of cough and wheezing likely due to acute bronchitis/COPD  Started Medrol Dosepak Started empiric azithromycin - advised to contact if home COVID test is positive Continue Trelegy and as needed albuterol for dyspnea or wheezing Tessalon as needed for cough Continue Flonase for nasal congestion

## 2023-09-24 NOTE — Progress Notes (Signed)
Virtual Visit via Video Note   Because of Ricardo Burch's co-morbid illnesses, he is at least at moderate risk for complications without adequate follow up.  This format is felt to be most appropriate for this patient at this time.  All issues noted in this document were discussed and addressed.  A limited physical exam was performed with this format.      Evaluation Performed:  Follow-up visit  Date:  09/24/2023   ID:  Ricardo Burch, DOB 1961-01-07, MRN [id number]  Patient Location: Home Provider Location: Office/Clinic  Participants: Patient Location of Patient: Home Location of Provider: Telehealth Consent was obtain for visit to be over via telehealth. I verified that I am speaking with the correct person using two identifiers.  PCP:  Anabel Halon, MD   Chief Complaint: Nasal congestion and cough  History of Present Illness:    Ricardo Burch is a 62 y.o. male with PMH of HTN, COPD, chronic hypoxic respiratory failure, OSA, GERD and chronic fatigue who has a video visit for c/o nasal congestion and recent worsening of cough with dyspnea for the last 3 days.  He denies any fever or chills.  He has been using Trelegy and as needed albuterol for dyspnea.  He has noticed O2 saturation in lower 90s recently.  The patient does have symptoms concerning for COVID-19 infection (fever, chills, cough, or new shortness of breath).   Past Medical, Surgical, Social History, Allergies, and Medications have been Reviewed.  Past Medical History:  Diagnosis Date   Allergy    CHF (congestive heart failure) (HCC)    Chronic kidney disease    COPD (chronic obstructive pulmonary disease) (HCC) Dx 2015   Depression    Eczema    since childhood    Erectile dysfunction 08/18/2019   GERD (gastroesophageal reflux disease) 01/25/2020   Hyperlipidemia 01/12/2018   Hypertension    Oxygen deficiency    Screening for HIV (human immunodeficiency virus)    Sleep apnea    CPAP   Substance abuse (HCC)     MJ, cocaine   Testicular failure 07/22/2019   Past Surgical History:  Procedure Laterality Date   COLONOSCOPY WITH PROPOFOL N/A 06/25/2017   Procedure: COLONOSCOPY WITH PROPOFOL;  Surgeon: Corbin Ade, MD;  Location: [facility];  Service: Endoscopy;  Laterality: N/A;  2:00pm   MULTIPLE EXTRACTIONS WITH ALVEOLOPLASTY N/A 03/22/2016   Procedure: MULTIPLE EXTRACTION WITH ALVEOLOPLASTY;  Surgeon: Ocie Doyne, DDS;  Location: [facility] OR;  Service: Oral Surgery;  Laterality: N/A;   NO PAST SURGERIES     POLYPECTOMY  06/25/2017   Procedure: POLYPECTOMY;  Surgeon: Corbin Ade, MD;  Location: [facility];  Service: Endoscopy;;  colon     No outpatient medications have been marked as taking for the 09/24/23 encounter (Video Visit) with Anabel Halon, MD.     Allergies:   Apresoline [hydralazine]   ROS:   Please see the history of present illness.    All other systems reviewed and are negative.   Labs/Other Tests and Data Reviewed:    Recent Labs: 05/19/2023: ALT 18; BUN 13; Creatinine, Ser 0.99; Hemoglobin 16.1; Platelets 197; Potassium 4.7; Sodium 138; TSH 1.090   Recent Lipid Panel Lab Results  Component Value Date/Time   CHOL 150 05/19/2023 09:27 AM   TRIG 49 05/19/2023 09:27 AM   HDL 52 05/19/2023 09:27 AM   CHOLHDL 2.9 05/19/2023 09:27 AM   CHOLHDL 2.8 10/25/2020 09:26 AM   LDLCALC 87 05/19/2023  09:27 AM   LDLCALC 86 10/25/2020 09:26 AM    Wt Readings from Last 3 Encounters:  08/13/23 230 lb 9.6 oz (104.6 kg)  08/12/23 232 lb 12.8 oz (105.6 kg)  04/10/23 234 lb 9.6 oz (106.4 kg)     Objective:    Vital Signs:  There were no vitals taken for this visit.   VITAL SIGNS:  reviewed GEN:  no acute distress EYES:  sclerae anicteric, EOMI - Extraocular Movements Intact RESPIRATORY:  normal respiratory effort, symmetric expansion NEURO:  alert and oriented x 3, no obvious focal deficit PSYCH:  normal affect  ASSESSMENT & PLAN:    COPD exacerbation (HCC) Recent  worsening of cough and wheezing likely due to acute bronchitis/COPD  Started Medrol Dosepak Started empiric azithromycin - advised to contact if home COVID test is positive Continue Trelegy and as needed albuterol for dyspnea or wheezing Tessalon as needed for cough Continue Flonase for nasal congestion   I discussed the assessment and treatment plan with the patient. The patient was provided an opportunity to ask questions, and all were answered. The patient agreed with the plan and demonstrated an understanding of the instructions.   The patient was advised to call back or seek an in-person evaluation if the symptoms worsen or if the condition fails to improve as anticipated.  The above assessment and management plan was discussed with the patient. The patient verbalized understanding of and has agreed to the management plan.   Medication Adjustments/Labs and Tests Ordered: Current medicines are reviewed at length with the patient today.  Concerns regarding medicines are outlined above.   Tests Ordered: No orders of the defined types were placed in this encounter.   Medication Changes: No orders of the defined types were placed in this encounter.    Note: This dictation was prepared with Dragon dictation along with smaller phrase technology. Similar sounding words can be transcribed inadequately or may not be corrected upon review. Any transcriptional errors that result from this process are unintentional.      Disposition:  Follow up  Signed, Anabel Halon, MD  09/24/2023 9:25 AM     [facility]

## 2023-09-25 ENCOUNTER — Emergency Department (HOSPITAL_COMMUNITY)
Admission: EM | Admit: 2023-09-25 | Discharge: 2023-09-26 | Disposition: A | Discharge status: Home / Self Care | Payer: 59 | Attending: Emergency Medicine | Admitting: Emergency Medicine

## 2023-09-25 ENCOUNTER — Encounter (HOSPITAL_COMMUNITY): Payer: Self-pay

## 2023-09-25 ENCOUNTER — Other Ambulatory Visit: Payer: Self-pay

## 2023-09-25 DIAGNOSIS — Z79899 Other long term (current) drug therapy: Secondary | ICD-10-CM | POA: Insufficient documentation

## 2023-09-25 DIAGNOSIS — E871 Hypo-osmolality and hyponatremia: Secondary | ICD-10-CM | POA: Insufficient documentation

## 2023-09-25 DIAGNOSIS — I509 Heart failure, unspecified: Secondary | ICD-10-CM | POA: Diagnosis not present

## 2023-09-25 DIAGNOSIS — J449 Chronic obstructive pulmonary disease, unspecified: Secondary | ICD-10-CM | POA: Diagnosis not present

## 2023-09-25 DIAGNOSIS — R739 Hyperglycemia, unspecified: Secondary | ICD-10-CM

## 2023-09-25 DIAGNOSIS — N189 Chronic kidney disease, unspecified: Secondary | ICD-10-CM | POA: Diagnosis not present

## 2023-09-25 DIAGNOSIS — R1030 Lower abdominal pain, unspecified: Secondary | ICD-10-CM | POA: Diagnosis not present

## 2023-09-25 DIAGNOSIS — K5900 Constipation, unspecified: Secondary | ICD-10-CM | POA: Diagnosis not present

## 2023-09-25 DIAGNOSIS — I130 Hypertensive heart and chronic kidney disease with heart failure and stage 1 through stage 4 chronic kidney disease, or unspecified chronic kidney disease: Secondary | ICD-10-CM | POA: Insufficient documentation

## 2023-09-25 DIAGNOSIS — R17 Unspecified jaundice: Secondary | ICD-10-CM

## 2023-09-25 LAB — COMPREHENSIVE METABOLIC PANEL
ALT: 24 U/L (ref 0–44)
AST: 24 U/L (ref 15–41)
Albumin: 4.2 g/dL (ref 3.5–5.0)
Alkaline Phosphatase: 74 U/L (ref 38–126)
Anion gap: 10 (ref 5–15)
BUN: 19 mg/dL (ref 6–20)
CO2: 26 mmol/L (ref 22–32)
Calcium: 8.8 mg/dL — ABNORMAL LOW (ref 8.9–10.3)
Chloride: 97 mmol/L — ABNORMAL LOW (ref 98–111)
Creatinine, Ser: 0.88 mg/dL (ref 0.61–1.24)
GFR, Estimated: >60 mL/min (ref >60)
Glucose, Bld: 114 mg/dL — ABNORMAL HIGH (ref 70–99)
Potassium: 4 mmol/L (ref 3.5–5.1)
Sodium: 133 mmol/L — ABNORMAL LOW (ref 135–145)
Total Bilirubin: 1.8 mg/dL — ABNORMAL HIGH (ref <1.2)
Total Protein: 7.9 g/dL (ref 6.5–8.1)

## 2023-09-25 LAB — URINALYSIS, ROUTINE W REFLEX MICROSCOPIC
Bacteria, UA: NONE SEEN
Bilirubin Urine: NEGATIVE
Glucose, UA: >=500 mg/dL — AB
Hgb urine dipstick: NEGATIVE
Ketones, ur: NEGATIVE mg/dL
Leukocytes,Ua: NEGATIVE
Nitrite: NEGATIVE
Protein, ur: NEGATIVE mg/dL
Specific Gravity, Urine: 1.026 (ref 1.005–1.030)
pH: 7 (ref 5.0–8.0)

## 2023-09-25 LAB — CBC
HCT: 49.3 % (ref 39.0–52.0)
Hemoglobin: 16.2 g/dL (ref 13.0–17.0)
MCH: 30.6 pg (ref 26.0–34.0)
MCHC: 32.9 g/dL (ref 30.0–36.0)
MCV: 93 fL (ref 80.0–100.0)
Platelets: 216 10*3/uL (ref 150–400)
RBC: 5.3 MIL/uL (ref 4.22–5.81)
RDW: 14.1 % (ref 11.5–15.5)
WBC: 12.8 10*3/uL — ABNORMAL HIGH (ref 4.0–10.5)
nRBC: 0 % (ref 0.0–0.2)

## 2023-09-25 LAB — LIPASE, BLOOD: Lipase: 29 U/L (ref 11–51)

## 2023-09-25 NOTE — ED Triage Notes (Signed)
ABD pain  LLQ Started after eating BBQ sandwich that wife brought home from work   Denies n/v/d

## 2023-09-26 ENCOUNTER — Encounter (HOSPITAL_COMMUNITY): Payer: Self-pay

## 2023-09-26 ENCOUNTER — Emergency Department (HOSPITAL_COMMUNITY)
Admission: EM | Admit: 2023-09-26 | Discharge: 2023-09-26 | Disposition: A | Discharge status: Home / Self Care | Payer: 59 | Attending: Emergency Medicine | Admitting: Emergency Medicine

## 2023-09-26 ENCOUNTER — Emergency Department (HOSPITAL_COMMUNITY): Payer: 59

## 2023-09-26 ENCOUNTER — Other Ambulatory Visit: Payer: Self-pay

## 2023-09-26 DIAGNOSIS — R1030 Lower abdominal pain, unspecified: Secondary | ICD-10-CM | POA: Diagnosis not present

## 2023-09-26 DIAGNOSIS — I509 Heart failure, unspecified: Secondary | ICD-10-CM | POA: Insufficient documentation

## 2023-09-26 DIAGNOSIS — Z79899 Other long term (current) drug therapy: Secondary | ICD-10-CM | POA: Insufficient documentation

## 2023-09-26 DIAGNOSIS — J449 Chronic obstructive pulmonary disease, unspecified: Secondary | ICD-10-CM | POA: Insufficient documentation

## 2023-09-26 DIAGNOSIS — K5900 Constipation, unspecified: Secondary | ICD-10-CM | POA: Insufficient documentation

## 2023-09-26 DIAGNOSIS — I130 Hypertensive heart and chronic kidney disease with heart failure and stage 1 through stage 4 chronic kidney disease, or unspecified chronic kidney disease: Secondary | ICD-10-CM | POA: Insufficient documentation

## 2023-09-26 DIAGNOSIS — N189 Chronic kidney disease, unspecified: Secondary | ICD-10-CM | POA: Insufficient documentation

## 2023-09-26 MED ORDER — BISACODYL 5 MG PO TBEC
5.0000 mg | DELAYED_RELEASE_TABLET | Freq: Once | ORAL | Status: AC
  Administered 2023-09-26: 5 mg via ORAL
  Filled 2023-09-26: qty 1

## 2023-09-26 MED ORDER — IOHEXOL 300 MG/ML  SOLN: 100.0000 mL | Freq: Once | INTRAMUSCULAR | Status: DC | PRN

## 2023-09-26 NOTE — ED Provider Triage Note (Signed)
Emergency Medicine Provider Triage Evaluation Note  Ricardo Burch , a 62 y.o. male  was evaluated in triage.  Pt complains of lower abdominal cramping and inability to have BM in one day.  Review of Systems  Positive: abdominal cramping and inability to have BM Negative: fevers  Physical Exam  BP 113/74 (BP Location: Right Arm)   Pulse 96   Temp 98.5 F (36.9 C) (Oral)   Resp 16   Ht 5\' 9"  (1.753 m)   Wt 105.2 kg   SpO2 92%   BMI 34.26 kg/m  Gen:   Awake, no distress   Resp:  Normal effort  MSK:   Moves extremities without difficulty  Other:  Well appearing  Medical Decision Making  Medically screening exam initiated at 10:37 AM.  Appropriate orders placed.  Ricardo Burch was informed that the remainder of the evaluation will be completed by another provider, this initial triage assessment does not replace that evaluation, and the importance of remaining in the ED until their evaluation is complete.    Judithann Sheen, PA 09/26/23 1037

## 2023-09-26 NOTE — ED Provider Notes (Signed)
[place] EMERGENCY DEPARTMENT AT [facility] Provider Note   CSN: [id number] Arrival date & time: 09/25/23  2009     History  Chief Complaint  Patient presents with   Abdominal Pain    Ricardo Burch is a 62 y.o. male.  The history is provided by the patient.  Abdominal Pain He has history of hypertension, hyperlipidemia, chronic kidney disease, heart failure, COPD and comes in complaining of lower abdominal cramping.  Pain started after eating a barbecue sandwich.  Pain was initially on the right side, moved to the left side, and then moved back to the right side.  There is no radiation of the back or upper abdomen or chest or shoulder.  He denies any nausea or vomiting.  He thinks he might be constipated.  He did have a bowel movement this morning, but has not passed any flatus since this pain came on.  He thinks he has had similar episodes in the past but cannot recall the diagnosis had been made.   Home Medications Prior to Admission medications   Medication Sig Start Date End Date Taking? Authorizing Provider  albuterol (VENTOLIN HFA) 108 (90 Base) MCG/ACT inhaler INHALE 1-2 PUFFS BY MOUTH EVERY 6 HOURS AS NEEDED FOR WHEEZING OR FOR SHORTNESS OF BREATH 05/26/23   Mannam, Praveen, MD  amLODipine (NORVASC) 5 MG tablet TAKE 1 TABLET BY MOUTH DAILY 03/25/23   Anabel Halon, MD  azithromycin (ZITHROMAX) 250 MG tablet Take 2 tablets on day 1, then 1 tablet daily on days 2 through 5 09/24/23 09/29/23  Anabel Halon, MD  benzonatate (TESSALON) 200 MG capsule Take 1 capsule (200 mg total) by mouth 2 (two) times daily as needed for cough. 12/24/22   Anabel Halon, MD  cyclobenzaprine (FLEXERIL) 5 MG tablet Take 1 tablet (5 mg total) by mouth 2 (two) times daily as needed for muscle spasms. 04/10/23   Patel, Earlie Lou, MD  FARXIGA 10 MG TABS tablet Take 10 mg by mouth daily. 02/25/22   [provider]  fluticasone (FLONASE) 50 MCG/ACT nasal spray INSTILL TWO (2) SPRAYS IN  EACH NOSTRIL TWICE DAILY 05/26/23   Mannam, Praveen, MD  furosemide (LASIX) 20 MG tablet Take 20 mg by mouth daily. 12/25/21   [provider]  ketoconazole (NIZORAL) 2 % cream Apply 1 Application topically daily. 02/10/23   Anabel Halon, MD  levocetirizine (XYZAL) 5 MG tablet Take 1 tablet (5 mg total) by mouth every evening. 07/19/22   Gilmore Laroche, FNP  methylPREDNISolone (MEDROL DOSEPAK) 4 MG TBPK tablet Take as package instructions. 09/24/23   Anabel Halon, MD  OXYGEN Inhale 3 L into the lungs daily. continuous    [provider]  roflumilast (DALIRESP) 500 MCG TABS tablet TAKE 1 TABLET ( ) BY MOUTH DAILY 10/25/22   Mannam, Praveen, MD  sennosides-docusate sodium (SENOKOT-S) 8.6-50 MG tablet Take 1 tablet by mouth daily. 04/10/23   Anabel Halon, MD  sildenafil (VIAGRA) 50 MG tablet Take 1 tablet (50 mg total) by mouth daily as needed for erectile dysfunction. 08/12/23   Anabel Halon, MD  spironolactone (ALDACTONE) 25 MG tablet Take 25 mg by mouth daily. 09/17/22   [provider]  Dwyane Luo 200-62.5-25 MCG/ACT AEPB INHALE ONE (1) PUFF BY MOUTH DAILY 08/23/23   Chilton Greathouse, MD      Allergies    Apresoline [hydralazine]    Review of Systems   Review of Systems  Gastrointestinal:  Positive for abdominal pain.  All other systems reviewed and are negative.   Physical Exam Updated Vital Signs BP (!) 104/58   Pulse 80   Temp 98.9 F (37.2 C) (Oral)   Resp (!) 22   Ht 5\' 9"  (1.753 m)   Wt 105.2 kg   SpO2 96%   BMI 34.26 kg/m  Physical Exam Vitals and nursing note reviewed.   62 year old male, resting comfortably and in no acute distress. Vital signs are normal. Oxygen saturation is 96%, which is normal. Head is normocephalic and atraumatic. PERRLA, EOMI. Oropharynx is clear. Neck is nontender and supple without adenopathy or JVD. Back is nontender and there is no CVA tenderness. Lungs are clear without rales, wheezes, or  rhonchi. Chest is nontender. Heart has regular rate and rhythm without murmur. Abdomen is soft, flat, nontender. Extremities have no cyanosis or edema, full range of motion is present. Skin is warm and dry without rash. Neurologic: Mental status is normal, cranial nerves are intact, moves all extremities equally.  ED Results / Procedures / Treatments   Labs (all labs ordered are listed, but only abnormal results are displayed) Labs Reviewed  COMPREHENSIVE METABOLIC PANEL - Abnormal; Notable for the following components:      Result Value   Sodium 133 (*)    Chloride 97 (*)    Glucose, Bld 114 (*)    Calcium 8.8 (*)    Total Bilirubin 1.8 (*)    All other components within normal limits  CBC - Abnormal; Notable for the following components:   WBC 12.8 (*)    All other components within normal limits  URINALYSIS, ROUTINE W REFLEX MICROSCOPIC - Abnormal; Notable for the following components:   Glucose, UA >=500 (*)    All other components within normal limits  LIPASE, BLOOD   Procedures Procedures    Medications Ordered in ED Medications  iohexol (OMNIPAQUE) 300 MG/ML solution 100 mL (has no administration in time range)  bisacodyl (DULCOLAX) EC tablet 5 mg (5 mg Oral Given 09/26/23 0149)    ED Course/ Medical Decision Making/ A&P                                 Medical Decision Making Amount and/or Complexity of Data Reviewed Labs: ordered. Radiology: ordered.  Risk OTC drugs. Prescription drug management.   Lower abdominal pain of uncertain cause.  His exam is benign although he continues to have pain.  I have reviewed his laboratory tests, and my interpretation is mild hyponatremia which is not felt to be clinically significant, mildly elevated random glucose which will need to be followed as an outpatient, elevated total bilirubin with normal transaminases suspicious for possible Gilbert's disease, mild leukocytosis which is nonspecific.  I have reviewed his past  records, and on 09/28/2021 he had a CT renal stone study which noted presence of diverticulosis.  In spite of benign exam, I am suspicious that he might have mild diverticulitis.  I have recommended CT of abdomen and pelvis.  Patient at this point stated that he had been in the emergency department too long and was worried about his wife at home and wanted to leave without having the CT scan.  I have advised him that without CT scan, I cannot diagnose diverticulitis and I cannot treat him with antibiotics without a diagnosis.  He expressed understanding states he will come back if his pain persist.  He is requesting a laxative and I  have ordered a dose of bisacodyl.  Clinically, he does not appear to have a bowel obstruction so I feel a mild laxative is safe.  He is given strict return precautions.  Final Clinical Impression(s) / ED Diagnoses Final diagnoses:  Lower abdominal pain  Hyponatremia  Elevated random blood glucose level  Serum total bilirubin elevated    Rx / DC Orders ED Discharge Orders     None         Dione Booze, MD 09/26/23 [phone]

## 2023-09-26 NOTE — ED Provider Notes (Signed)
[place] EMERGENCY DEPARTMENT AT [facility] Provider Note   CSN: [id number] Arrival date & time: 09/26/23  6237     History {Add pertinent medical, surgical, social history, OB history to HPI:1} Chief Complaint  Patient presents with   Constipation    Ricardo Burch is a 62 y.o. male with PMHx of diverticulosis, hypertension, hyperlipidemia, chronic kidney disease, heart failure, COPD presents to ED for evaluation of abdominal pain that started yesterday. He was seen in ED yesterday complaining of debilitating lower abdominal pain. He reports this pain has significantly improved and now is occasional lower abdominal cramping. He reports he feels like he needs to have a BM. Yesterday he used magnesium, bbq sandwich, and coffee to attempt and stimulate BM. He states he has had flautus since yesterday. He reports that he wanted to get checked out since he left without scan yesterday. He denies fevers, hematochezia, N/V.  EDP yesterday suggested CT scan to r/o diverticulitis however he refused wanting to get home to his wife. Labwork yesterday significant for mild leukocytosis and mild hyponatremia. He was given bisacodyl in ED without BM.  The history is provided by the patient. No language interpreter was used.  Constipation Associated symptoms: abdominal pain   Associated symptoms: no diarrhea, no fever, no nausea and no vomiting        Home Medications Prior to Admission medications   Medication Sig Start Date End Date Taking? Authorizing Provider  albuterol (VENTOLIN HFA) 108 (90 Base) MCG/ACT inhaler INHALE 1-2 PUFFS BY MOUTH EVERY 6 HOURS AS NEEDED FOR WHEEZING OR FOR SHORTNESS OF BREATH 05/26/23   Mannam, Praveen, MD  amLODipine (NORVASC) 5 MG tablet TAKE 1 TABLET BY MOUTH DAILY 03/25/23   Anabel Halon, MD  benzonatate (TESSALON) 200 MG capsule Take 1 capsule (200 mg total) by mouth 2 (two) times daily as needed for cough. 12/24/22   Anabel Halon, MD  cyclobenzaprine  (FLEXERIL) 5 MG tablet Take 1 tablet (5 mg total) by mouth 2 (two) times daily as needed for muscle spasms. 04/10/23   Patel, Earlie Lou, MD  FARXIGA 10 MG TABS tablet Take 10 mg by mouth daily. 02/25/22   [provider]  fluticasone (FLONASE) 50 MCG/ACT nasal spray INSTILL TWO (2) SPRAYS IN EACH NOSTRIL TWICE DAILY 05/26/23   Mannam, Praveen, MD  furosemide (LASIX) 20 MG tablet Take 20 mg by mouth daily. 12/25/21   [provider]  ketoconazole (NIZORAL) 2 % cream Apply 1 Application topically daily. 02/10/23   Anabel Halon, MD  levocetirizine (XYZAL) 5 MG tablet Take 1 tablet (5 mg total) by mouth every evening. 07/19/22   Gilmore Laroche, FNP  methylPREDNISolone (MEDROL DOSEPAK) 4 MG TBPK tablet Take as package instructions. 09/24/23   Anabel Halon, MD  OXYGEN Inhale 3 L into the lungs daily. continuous    [provider]  roflumilast (DALIRESP) 500 MCG TABS tablet TAKE 1 TABLET ( ) BY MOUTH DAILY 09/26/23   Mannam, Praveen, MD  sennosides-docusate sodium (SENOKOT-S) 8.6-50 MG tablet Take 1 tablet by mouth daily. 04/10/23   Anabel Halon, MD  sildenafil (VIAGRA) 50 MG tablet Take 1 tablet (50 mg total) by mouth daily as needed for erectile dysfunction. 08/12/23   Anabel Halon, MD  spironolactone (ALDACTONE) 25 MG tablet Take 25 mg by mouth daily. 09/17/22   [provider]  Dwyane Luo 200-62.5-25 MCG/ACT AEPB INHALE ONE (1) PUFF BY MOUTH DAILY 08/23/23   Chilton Greathouse, MD  Allergies    Apresoline [hydralazine]    Review of Systems   Review of Systems  Constitutional:  Negative for chills, fatigue and fever.  Respiratory:  Negative for cough, chest tightness, shortness of breath and wheezing.   Cardiovascular:  Negative for chest pain and palpitations.  Gastrointestinal:  Positive for abdominal pain and constipation. Negative for abdominal distention, blood in stool, diarrhea, nausea and vomiting.  Neurological:  Negative for dizziness,  seizures, weakness, light-headedness, numbness and headaches.    Physical Exam Updated Vital Signs BP 113/74 (BP Location: Right Arm)   Pulse 96   Temp 98.5 F (36.9 C) (Oral)   Resp 16   Ht 5\' 9"  (1.753 m)   Wt 105.2 kg   SpO2 92%   BMI 34.26 kg/m  Physical Exam Vitals and nursing note reviewed.  Constitutional:      General: He is not in acute distress.    Appearance: Normal appearance.  HENT:     Head: Normocephalic and atraumatic.  Eyes:     Conjunctiva/sclera: Conjunctivae normal.  Cardiovascular:     Rate and Rhythm: Normal rate.  Pulmonary:     Effort: Pulmonary effort is normal. No respiratory distress.  Abdominal:     General: Bowel sounds are normal. There is no distension.     Palpations: Abdomen is soft.     Tenderness: There is no abdominal tenderness. There is no right CVA tenderness, left CVA tenderness, guarding or rebound.  Skin:    General: Skin is warm.     Capillary Refill: Capillary refill takes less than 2 seconds.     Coloration: Skin is not jaundiced or pale.  Neurological:     Mental Status: He is alert and oriented to person, place, and time. Mental status is at baseline.     ED Results / Procedures / Treatments   Labs (all labs ordered are listed, but only abnormal results are displayed) Labs Reviewed - No data to display  EKG None  Radiology No results found.  Procedures Procedures  {Document cardiac monitor, telemetry assessment procedure when appropriate:1}  Medications Ordered in ED Medications - No data to display  ED Course/ Medical Decision Making/ A&P   {   Click here for ABCD2, HEART and other calculatorsREFRESH Note before signing :1}                              Medical Decision Making  Patient presents to the ED for concern of ***, this involves an extensive number of treatment options, and is a complaint that carries with it a high risk of complications and morbidity.  The differential diagnosis includes  ***   Co morbidities that complicate the patient evaluation  HTN, HLD, CKD, HF, COPD   Additional history obtained:  Additional history obtained from Nursing and Outside Medical Records   External records from outside source obtained and reviewed including ***   Lab Tests:  I Ordered, and personally interpreted labs.  The pertinent results include:  ***   Imaging Studies ordered:  I ordered imaging studies including ***  I independently visualized and interpreted imaging which showed *** I agree with the radiologist interpretation   Cardiac Monitoring:  The patient was maintained on a cardiac monitor.  I personally viewed and interpreted the cardiac monitored which showed an underlying rhythm of: ***   Medicines ordered and prescription drug management:  I ordered medication including ***  for ***  Reevaluation  of the patient after these medicines showed that the patient {resolved/improved/worsened:23923::"improved"} I have reviewed the patients home medicines and have made adjustments as needed   Test Considered:  ***   Critical Interventions:  ***   Consultations Obtained:  I requested consultation with the ***,  and discussed lab and imaging findings as well as pertinent plan - they recommend: ***   Problem List / ED Course:  ***   Reevaluation:  After the interventions noted above, I reevaluated the patient and found that they have :{resolved/improved/worsened:23923::"improved"}   Social Determinants of Health:  ***   Dispostion:  After consideration of the diagnostic results and the patients response to treatment, I feel that the patent would benefit from ***.    {Document critical care time when appropriate:1} {Document review of labs and clinical decision tools ie heart score, Chads2Vasc2 etc:1}  {Document your independent review of radiology images, and any outside records:1} {Document your discussion with family members,  caretakers, and with consultants:1} {Document social determinants of health affecting pt's care:1} {Document your decision making why or why not admission, treatments were needed:1} Final Clinical Impression(s) / ED Diagnoses Final diagnoses:  None    Rx / DC Orders ED Discharge Orders     None

## 2023-09-26 NOTE — ED Triage Notes (Signed)
Pt c/o constipation and was seen yesterday in the ED. Pt states he took the medication that was prescribed and that he is still unable to have a BM. Pt states his pain is less intense in his LLQ today compared to yesterday.

## 2023-09-26 NOTE — Discharge Instructions (Addendum)
The cause for your pain is not clear.  I had recommended a CT scan of your abdomen to make sure that you do not have diverticulitis.  If you do have diverticulitis, you need to be on antibiotics.  You have chosen not to have the CT scan.  Therefore, I cannot be sure that you do not have diverticulitis.  If you change your mind about having the CT scan, you are welcome to come back at any time.  If your pain is getting worse, you start running fever, or you start vomiting then you should definitely return as soon as possible.  Otherwise, follow-up with your primary care provider.

## 2023-09-26 NOTE — Discharge Instructions (Addendum)
Thank you for letting us evaluate you today.  I am reassured that your abdominal pain is significantly improved since yesterday.  You are not tender upon exam so I do not feel that imaging is warranted at this time.  Your lab work from yesterday was not significant for any significant electrolyte abnormalities or anything requiring ED intervention.  Please make sure to eat plenty of high-fiber foods which include leafy greens, broccoli, Brussels sprouts, apples, beans, bread and drink plenty of water.  Increasing exercise can also help stimulate the gut. You may take colacae OTC found at your pharmacy if your stool is hard and difficulty to pass or Miralax for laxative support. If your constipation does not resolve, you may follow-up with your primary care provider regarding long-term management.  They may recommend you seek further management from GI.  Please return to emergency department if you experience no bowel movement in a week especially with fevers, blood in stool, vomiting following eating, fecal impaction, debilitating abdominal pain

## 2023-10-06 DIAGNOSIS — J449 Chronic obstructive pulmonary disease, unspecified: Secondary | ICD-10-CM | POA: Diagnosis not present

## 2023-10-07 ENCOUNTER — Encounter: Payer: Self-pay | Admitting: Internal Medicine

## 2023-10-21 ENCOUNTER — Telehealth: Payer: Self-pay

## 2023-10-21 NOTE — Progress Notes (Signed)
Transition Care Management Unsuccessful Follow-up Telephone Call  Date of discharge and from where:  09/26/2023 [facility]  Attempts:  1st Attempt  Reason for unsuccessful TCM follow-up call:  No answer/busy  Steve Youngberg [facility]  [facility], [facility] Resource Care Guide Direct Dial: [phone]  Website: [email or web address]

## 2023-10-22 ENCOUNTER — Telehealth: Payer: Self-pay

## 2023-10-22 NOTE — Progress Notes (Signed)
Transition Care Management Unsuccessful Follow-up Telephone Call  Date of discharge and from where:  09/26/2023 [facility]  Attempts:  2nd Attempt  Reason for unsuccessful TCM follow-up call:  Left voice message  Kadey Mihalic [facility]  [facility], [facility] Resource Care Guide Direct Dial: [phone]  Website: [email or web address]

## 2023-11-06 ENCOUNTER — Encounter: Payer: Self-pay | Admitting: Family Medicine

## 2023-11-06 ENCOUNTER — Ambulatory Visit (INDEPENDENT_AMBULATORY_CARE_PROVIDER_SITE_OTHER): Payer: 59 | Admitting: Family Medicine

## 2023-11-06 VITALS — BP 127/83 | HR 87 | Temp 98.7°F | Resp 17 | Ht 68.0 in | Wt 230.8 lb

## 2023-11-06 DIAGNOSIS — J449 Chronic obstructive pulmonary disease, unspecified: Secondary | ICD-10-CM | POA: Diagnosis not present

## 2023-11-06 DIAGNOSIS — B349 Viral infection, unspecified: Secondary | ICD-10-CM | POA: Diagnosis not present

## 2023-11-06 MED ORDER — PROMETHAZINE-DM 6.25-15 MG/5ML PO SYRP: 5.0000 mL | ORAL_SOLUTION | Freq: Four times a day (QID) | ORAL | 0 refills | Status: DC | PRN

## 2023-11-06 NOTE — Progress Notes (Signed)
 Acute Office Visit  Subjective:    Patient ID: Ricardo Burch, male    DOB: May 13, 1961, 63 y.o.   MRN: [id number]  Chief Complaint  Patient presents with   Nasal Congestion    Sinus drainage headache-pain behind eyes, started getting bad Friday, then sat he started coughing up thick mucus that was yellow but now turning more white. Started feeling better yesterday but still congested in chest. Has used OTC mucinex  and sinus meds. Covid test was negative Friday and yesterday.    HPI The patient presents today reporting that he is feeling much better. He noted the onset of facial pain and pressure on 10/31/2023, which worsened on 11/01/2023, along with coughing up thick yellow mucus, which has now turned white. He denies symptoms of wheezing, increased cough, fever, chills, or facial pain and pressure today. The patient reports that he has been treating his symptoms with an over-the-counter regimen, including Tylenol , Mucinex , rest, and increased hydration.   Past Medical History:  Diagnosis Date   Allergy    CHF (congestive heart failure) (HCC)    Chronic kidney disease    COPD (chronic obstructive pulmonary disease) (HCC) Dx 2015   Depression    Eczema    since childhood    Erectile dysfunction 08/18/2019   GERD (gastroesophageal reflux disease) 01/25/2020   Hyperlipidemia 01/12/2018   Hypertension    Oxygen  deficiency    Screening for HIV (human immunodeficiency virus)    Sleep apnea    CPAP   Substance abuse (HCC)    MJ, cocaine   Testicular failure 07/22/2019    Past Surgical History:  Procedure Laterality Date   COLONOSCOPY WITH PROPOFOL  N/A 06/25/2017   Procedure: COLONOSCOPY WITH PROPOFOL ;  Surgeon: Shaaron Lamar HERO, MD;  Location: [facility];  Service: Endoscopy;  Laterality: N/A;  2:00pm   MULTIPLE EXTRACTIONS WITH ALVEOLOPLASTY N/A 03/22/2016   Procedure: MULTIPLE EXTRACTION WITH ALVEOLOPLASTY;  Surgeon: Glendia Primrose, DDS;  Location: [facility] OR;  Service: Oral Surgery;   Laterality: N/A;   NO PAST SURGERIES     POLYPECTOMY  06/25/2017   Procedure: POLYPECTOMY;  Surgeon: Shaaron Lamar HERO, MD;  Location: [facility];  Service: Endoscopy;;  colon    Family History  Problem Relation Age of Onset   Arthritis Mother    Hypertension Mother    Stroke Mother        age 72   Cerebral aneurysm Father    Alcohol abuse Father    Cancer Brother    Diabetes Neg Hx    Heart disease Neg Hx     Social History   Socioeconomic History   Marital status: Single    Spouse name: Not on file   Number of children: 5   Years of education: 12   Highest education level: Associate degree: occupational, scientist, product/process development, or vocational program  Occupational History   Occupation: Unemployed     Comment: car wash details  Tobacco Use   Smoking status: Former    Current packs/day: 0.00    Average packs/day: 1 pack/day for 38.0 years (38.0 ttl pk-yrs)    Types: Cigarettes    Start date: 02/20/1982    Quit date: 02/21/2020    Years since quitting: 3.7   Smokeless tobacco: Never  Vaping Use   Vaping status: Never Used  Substance and Sexual Activity   Alcohol use: Yes    Alcohol/week: 1.0 standard drink of alcohol    Types: 1 Cans of beer per week    Comment:  Socially x 1/month currently (as of 01/25/20); previously: occassionally: weekends, 6-pack or less on a day; or half pint of liquior   Drug use: Not Currently    Types: Marijuana, Cocaine    Comment: 01/25/20-marijuana few weeks ago, no cocaine   Sexual activity: Never  Other Topics Concern   Not on file  Social History Narrative    Lives alone.    5 daughters 58, 34 yo twins, eldest 2 are married live in [place]. Retired.Girlfriend works in Programmer, [employer].   Social Drivers of Corporate Investment Banker Strain: Low Risk  (08/07/2023)   Overall Financial Resource Strain (CARDIA)    Difficulty of Paying Living Expenses: Not very hard  Food Insecurity: Food Insecurity Present (08/07/2023)   Hunger Vital Sign     Worried About Running Out of Food in the Last Year: Sometimes true    Ran Out of Food in the Last Year: Sometimes true  Transportation Needs: Unmet Transportation Needs (08/07/2023)   PRAPARE - Administrator, Civil Service (Medical): Yes    Lack of Transportation (Non-Medical): Yes  Physical Activity: Sufficiently Active (08/07/2023)   Exercise Vital Sign    Days of Exercise per Week: 4 days    Minutes of Exercise per Session: 60 min  Stress: No Stress Concern Present (08/07/2023)   [employer] of Occupational Health - Occupational Stress Questionnaire    Feeling of Stress : Only a little  Social Connections: Moderately Integrated (08/07/2023)   Social Connection and Isolation Panel [NHANES]    Frequency of Communication with Friends and Family: Twice a week    Frequency of Social Gatherings with Friends and Family: Twice a week    Attends Religious Services: More than 4 times per year    Active Member of [employer] or Organizations: No    Attends Banker Meetings: Never    Marital Status: Married  Catering Manager Violence: Not At Risk (01/02/2023)   Humiliation, Afraid, Rape, and Kick questionnaire    Fear of Current or Ex-Partner: No    Emotionally Abused: No    Physically Abused: No    Sexually Abused: No    Outpatient Medications Prior to Visit  Medication Sig Dispense Refill   albuterol  (VENTOLIN  HFA) 108 (90 Base) MCG/ACT inhaler INHALE 1-2 PUFFS BY MOUTH EVERY 6 HOURS AS NEEDED FOR WHEEZING OR FOR SHORTNESS OF BREATH 8.5 g 10   amLODipine  (NORVASC ) 5 MG tablet TAKE 1 TABLET BY MOUTH DAILY 30 tablet 10   cyclobenzaprine  (FLEXERIL ) 5 MG tablet Take 1 tablet (5 mg total) by mouth 2 (two) times daily as needed for muscle spasms. 30 tablet 1   FARXIGA 10 MG TABS tablet Take 10 mg by mouth daily.     fluticasone  (FLONASE ) 50 MCG/ACT nasal spray INSTILL TWO (2) SPRAYS IN EACH NOSTRIL TWICE DAILY 16 g 10   furosemide  (LASIX ) 20 MG tablet Take 20 mg by mouth  daily.     ketoconazole  (NIZORAL ) 2 % cream Apply 1 Application topically daily. 30 g 0   levocetirizine (XYZAL ) 5 MG tablet Take 1 tablet (5 mg total) by mouth every evening. 30 tablet 0   OXYGEN  Inhale 3 L into the lungs daily. continuous     roflumilast  (DALIRESP ) 500 MCG TABS tablet TAKE 1 TABLET ( ) BY MOUTH DAILY 30 tablet 11   sennosides-docusate sodium  (SENOKOT-S) 8.6-50 MG tablet Take 1 tablet by mouth daily. 30 tablet 3   sildenafil  (VIAGRA ) 50 MG tablet Take 1 tablet (  50 mg total) by mouth daily as needed for erectile dysfunction. 30 tablet 1   spironolactone (ALDACTONE) 25 MG tablet Take 25 mg by mouth daily.     TRELEGY ELLIPTA  200-62.5-25 MCG/ACT AEPB INHALE ONE (1) PUFF BY MOUTH DAILY 60 each 10   benzonatate  (TESSALON ) 200 MG capsule Take 1 capsule (200 mg total) by mouth 2 (two) times daily as needed for cough. 20 capsule 0   methylPREDNISolone  (MEDROL  DOSEPAK) 4 MG TBPK tablet Take as package instructions. 1 each 0   No facility-administered medications prior to visit.    Allergies  Allergen Reactions   Apresoline  [Hydralazine ] Other (See Comments)    Headache     Review of Systems  Constitutional:  Negative for fatigue and fever.  Eyes:  Negative for visual disturbance.  Respiratory:  Negative for chest tightness and shortness of breath.   Cardiovascular:  Negative for chest pain and palpitations.  Neurological:  Negative for dizziness and headaches.       Objective:    Physical Exam HENT:     Head: Normocephalic.     Right Ear: External ear normal.     Left Ear: External ear normal.     Nose: No congestion or rhinorrhea.     Right Sinus: No maxillary sinus tenderness or frontal sinus tenderness.     Left Sinus: No maxillary sinus tenderness or frontal sinus tenderness.     Mouth/Throat:     Mouth: Mucous membranes are moist.     Pharynx: No pharyngeal swelling or oropharyngeal exudate.     Tonsils: No tonsillar exudate.  Cardiovascular:     Rate  and Rhythm: Regular rhythm.     Heart sounds: No murmur heard. Pulmonary:     Effort: No respiratory distress.     Breath sounds: Normal breath sounds.  Neurological:     Mental Status: He is alert.     BP 127/83   Pulse 87   Temp 98.7 F (37.1 C) (Oral)   Resp 17   Ht 5' 8 (1.727 m)   Wt 230 lb 12.8 oz (104.7 kg)   SpO2 (!) 88%   BMI 35.09 kg/m  Wt Readings from Last 3 Encounters:  11/06/23 230 lb 12.8 oz (104.7 kg)  09/26/23 232 lb 0.2 oz (105.2 kg)  09/25/23 232 lb (105.2 kg)       Assessment & Plan:  Viral illness Assessment & Plan: The patient was encouraged to continue the use of Flonase  nasal spray for congestion. We will send a prescription for Promethazine  DM to help with postnasal drip and cold symptoms. The patient was also encouraged to rest, stay hydrated, use Tylenol  as needed for headaches and pain, and use a humidifier to help with congestion and cough. The patient was advised to follow up if symptoms worsen, such as thick yellow-green nasal discharge, fever, facial pain or pressure (especially around the forehead, cheeks, and eyes), loss of sense of smell, or tooth pain and discomfort in the upper jaw. The patient verbalized understanding and is aware of the plan of care.   Orders: -     Promethazine -DM; Take 5 mLs by mouth 4 (four) times daily as needed.  Dispense: 118 mL; Refill: 0   Note: This chart has been completed using Engineer, Civil (consulting) software, and while attempts have been made to ensure accuracy, certain words and phrases may not be transcribed as intended.   Aveline Daus, FNP

## 2023-11-06 NOTE — Assessment & Plan Note (Signed)
 The patient was encouraged to continue the use of Flonase  nasal spray for congestion. We will send a prescription for Promethazine  DM to help with postnasal drip and cold symptoms. The patient was also encouraged to rest, stay hydrated, use Tylenol  as needed for headaches and pain, and use a humidifier to help with congestion and cough. The patient was advised to follow up if symptoms worsen, such as thick yellow-green nasal discharge, fever, facial pain or pressure (especially around the forehead, cheeks, and eyes), loss of sense of smell, or tooth pain and discomfort in the upper jaw. The patient verbalized understanding and is aware of the plan of care.

## 2023-11-06 NOTE — Patient Instructions (Addendum)
 I appreciate the opportunity to provide care to you today!  Viral Illness:  I recommend symptomatic treatments with the following: Start taking Promethazine  DM 5 mL by mouth every 4 hours as needed for cough and cold symptoms. Increase fluid intake and allow for plenty of rest. Take Tylenol  as needed for pain, fever, or general discomfort. Use a humidifier at bedtime to help with cough and nasal congestion. For nasal congestion: The use of heated humidified air is a safe and effective therapy. Saline nasal sprays may also help alleviate nasal symptoms of the common cold. Follow up if your symptoms do not improve after 7 to 10 days of symptom onset.   Please follow up if  experiencing any of the following symptoms suggestive of sinusitis:  Persistent nasal congestion or blockage that lasts more than 10 days. Facial pain or pressure, especially around the forehead, cheeks, and eyes. Thick, yellow or green nasal discharge. Reduced sense of smell. Postnasal drip or sore throat due to mucus drainage. Cough, particularly at night. Fever (especially if it lasts more than a couple of days). Fatigue or feeling generally unwell. Tooth pain or discomfort in the upper jaw   It was a pleasure to see you and I look forward to continuing to work together on your health and well-being. Please do not hesitate to call the office if you need care or have questions about your care.  In case of emergency, please visit the Emergency Department for urgent care, or contact our clinic at [phone] to schedule an appointment. We're here to help you!   Have a wonderful day and week. With Gratitude, Mekia Dipinto MSN, FNP-BC

## 2023-11-26 ENCOUNTER — Other Ambulatory Visit: Payer: Self-pay | Admitting: Internal Medicine

## 2023-11-26 MED ORDER — CIPROFLOXACIN-DEXAMETHASONE 0.3-0.1 % OT SUSP: 4.0000 [drp] | Freq: Two times a day (BID) | OTIC | 0 refills | Status: DC

## 2023-11-26 NOTE — Telephone Encounter (Signed)
Copied from CRM [id number]. Topic: Clinical - Medication Refill >> Nov 26, 2023 10:06 AM Geroge Baseman wrote: Most Recent Primary Care Visit:  Provider: Gilmore Laroche  Department: [facility]  Visit Type: OFFICE VISIT  Date: 11/06/2023  Medication: Ciprofloxacin  Has the patient contacted their pharmacy? Yes (Told to call office Is this the correct pharmacy for this prescription? Yes If no, delete pharmacy and type the correct one.  This is the patient's preferred pharmacy:   [facility] - [place], [place] - [address] [place] [address] Phone: [phone] Fax: [phone]   Has the prescription been filled recently? No  Is the patient out of the medication? Yes  Has the patient been seen for an appointment in the last year OR does the patient have an upcoming appointment? Yes  Can we respond through MyChart? No, phone call  Agent: Please be advised that Rx refills may take up to 3 business days. We ask that you follow-up with your pharmacy.

## 2023-12-01 DIAGNOSIS — G4733 Obstructive sleep apnea (adult) (pediatric): Secondary | ICD-10-CM | POA: Diagnosis not present

## 2023-12-01 DIAGNOSIS — J449 Chronic obstructive pulmonary disease, unspecified: Secondary | ICD-10-CM | POA: Diagnosis not present

## 2023-12-07 DIAGNOSIS — J449 Chronic obstructive pulmonary disease, unspecified: Secondary | ICD-10-CM | POA: Diagnosis not present

## 2023-12-09 ENCOUNTER — Other Ambulatory Visit: Payer: 59

## 2023-12-09 ENCOUNTER — Ambulatory Visit: Payer: 59 | Attending: Student | Admitting: Student

## 2023-12-09 ENCOUNTER — Encounter: Payer: Self-pay | Admitting: Student

## 2023-12-09 VITALS — BP 118/70 | HR 76 | Ht 68.0 in | Wt 230.2 lb

## 2023-12-09 DIAGNOSIS — R001 Bradycardia, unspecified: Secondary | ICD-10-CM | POA: Diagnosis not present

## 2023-12-09 DIAGNOSIS — I272 Pulmonary hypertension, unspecified: Secondary | ICD-10-CM

## 2023-12-09 DIAGNOSIS — R002 Palpitations: Secondary | ICD-10-CM

## 2023-12-09 DIAGNOSIS — I10 Essential (primary) hypertension: Secondary | ICD-10-CM | POA: Diagnosis not present

## 2023-12-09 DIAGNOSIS — G4733 Obstructive sleep apnea (adult) (pediatric): Secondary | ICD-10-CM

## 2023-12-09 NOTE — Progress Notes (Signed)
 Cardiology Office Note    Date:  12/09/2023  ID:  Ricardo Burch, DOB [birth year], MRN [id number] Cardiologist: Maude Emmer, MD    History of Present Illness:    Ricardo Burch is a 63 y.o. male with past medical history of chronic HFpEF, pulmonary hypertension (Group 3 due to COPD/OSA), HTN, COPD, OSA and history of substance use who presents to the office today for overdue follow-up.  He was examined by Dr. Emmer in 08/2022 and respiratory status was overall stable at that time and he had been on 3 L nasal cannula at baseline. There was felt to be no indication for vasodilators for his pulmonary hypertension and he was scheduled to follow-up at [facility] for consideration of endobronchial lung volume reduction. Was continued on Amlodipine  5 mg daily and Lasix  20 mg daily.  By review of Care Everywhere, he was referred to the Pulmonary Hypertension clinic at [facility] by Pulmonology there and at the time of his visit with Pulmonology in 06/2023, his symptoms and functional status had improved with weight loss and treatment with endobronchial valves was felt to be unlikely to lead to symptomatic improvement. He did see [facility] in 09/2023 and was continued on Farxiga 10 mg daily and Spironolactone 25 mg daily. Labs on 09/10/2023 showed his proBNP was < 36 with creatinine at 0.8 and Na+ slightly low at 134.   In talking with the patient today, he reports overall feeling well since his last office visit. He uses a CPAP at night with supplemental oxygen  and only uses oxygen  as needed during the day. He has been concerned that his heart rate has been low in the 40's to 50's at times when checked at home. He denies any associated lightheadedness, dizziness or presyncope with this. Experiences occasional flutter but no persistent symptoms. Has been consuming more caffeine than usual. No recent exertional chest pain.  Studies Reviewed:   EKG: EKG is ordered today and demonstrates:   EKG  Interpretation Date/Time:  Tuesday December 09 2023 12:59:01 EST Ventricular Rate:  76 PR Interval:  200 QRS Duration:  174 QT Interval:  398 QTC Calculation: 447 R Axis:   238  Text Interpretation: Sinus rhythm with Premature atrial complexes Right bundle branch block Confirmed by Johnson Grate ([id number]) on 12/09/2023 1:02:48 PM       Echocardiogram: 01/2023   NORMAL LEFT VENTRICULAR SYSTOLIC FUNCTION WITH MILD LVH    MILD RV SYSTOLIC DYSFUNCTION (See above)    NO VALVULAR REGURGITATION    NO VALVULAR STENOSIS    3D acquisition and reconstructions were performed as part of this    examination to more accurately quantify the effects of identified    structural abnormalities as part of the exam. (post-processing on an    Independent workstation).   Physical Exam:   VS:  BP 118/70   Pulse 76   Ht 5' 8 (1.727 m)   Wt 230 lb 3.2 oz (104.4 kg)   BMI 35.00 kg/m    Wt Readings from Last 3 Encounters:  12/09/23 230 lb 3.2 oz (104.4 kg)  11/06/23 230 lb 12.8 oz (104.7 kg)  09/26/23 232 lb 0.2 oz (105.2 kg)     GEN: Well nourished, well developed male appearing in no acute distress NECK: No JVD; No carotid bruits CARDIAC: RRR, no murmurs, rubs, gallops RESPIRATORY:  Clear to auscultation without rales, wheezing or rhonchi  ABDOMEN: Appears non-distended. No obvious abdominal masses. EXTREMITIES: No clubbing or cyanosis. No pitting edema.  Distal pedal pulses are  2+ bilaterally.   Assessment and Plan:   1. Chronic HFpEF/Pulmonary HTN - Felt to be Group 3 in the setting of chronic lung disease and he is also followed by the Pulmonary HTN Clinic at [facility]. He appears euvolemic by examination today and respiratory status has been stable. Continue current medical therapy with Farxiga 10mg  daily, Lasix  20mg  daily and Spironolactone 25mg  daily. He does have follow-up labs scheduled for next month with his PCP.   2. Palpitations/Bradycardia - He does report occasional fluttering  sensations and brief bradycardia as discussed above but no symptoms during that time-frame. I suspect his brief bradycardia on his pulse oximeter is likely due to PAC's or PVC's as noted today. Will arrange for a 7-day Zio patch to rule-out any significant arrhythmias.   3. HTN - BP is well-controlled at 118/70 during today's visit. Continue current medical therapy with Amlodipine  5mg  daily and Spironolactone 25mg  daily.   4. COPD/OSA - He does use his CPAP at night and supplemental oxygen  as needed during the day. Followed by [facility] and [facility].   Signed, Laymon CHRISTELLA Qua, PA-C

## 2023-12-09 NOTE — Patient Instructions (Signed)
Medication Instructions:  Your physician recommends that you continue on your current medications as directed. Please refer to the Current Medication list given to you today.  *If you need a refill on your cardiac medications before your next appointment, please call your pharmacy*   Lab Work: None If you have labs (blood work) drawn today and your tests are completely normal, you will receive your results only by: MyChart Message (if you have MyChart) OR A paper copy in the mail If you have any lab test that is abnormal or we need to change your treatment, we will call you to review the results.   Testing/Procedures: None   Follow-Up: At [facility], you and your health needs are our priority.  As part of our continuing mission to provide you with exceptional heart care, we have created designated Provider Care Teams.  These Care Teams include your primary Cardiologist (physician) and Advanced Practice Providers (APPs -  Physician Assistants and Nurse Practitioners) who all work together to provide you with the care you need, when you need it.  We recommend signing up for the patient portal called "MyChart".  Sign up information is provided on this After Visit Summary.  MyChart is used to connect with patients for Virtual Visits (Telemedicine).  Patients are able to view lab/test results, encounter notes, upcoming appointments, etc.  Non-urgent messages can be sent to your provider as well.   To learn more about what you can do with MyChart, go to [email or web address].    Your next appointment:   1 year(s)  Provider:   You may see Charlton Haws, MD or one of the following Advanced Practice Providers on your designated Care Team:   Randall An, PA-C  Jacolyn Reedy, PA-C     Other Instructions Christena Deem- Long Term Monitor Instructions   Your physician has requested you wear your ZIO patch monitor____7___days.   This is a single patch monitor.  [employer] supplies one  patch monitor per enrollment.  Additional stickers are not available.   Please do not apply patch if you will be having a Nuclear Stress Test, Echocardiogram, Cardiac CT, MRI, or Chest Xray during the time frame you would be wearing the monitor. The patch cannot be worn during these tests.  You cannot remove and re-apply the ZIO XT patch monitor.   Your ZIO patch monitor will be sent USPS Priority mail from [facility] directly to your home address. The monitor may also be mailed to a PO BOX if home delivery is not available.   It may take 3-5 days to receive your monitor after you have been enrolled.   Once you have received you monitor, please review enclosed instructions.  Your monitor has already been registered assigning a specific monitor serial # to you.   Applying the monitor   Shave hair from upper left chest.   Hold abrader disc by orange tab.  Rub abrader in 40 strokes over left upper chest as indicated in your monitor instructions.   Clean area with 4 enclosed alcohol pads .  Use all pads to assure are is cleaned thoroughly.  Let dry.   Apply patch as indicated in monitor instructions.  Patch will be place under collarbone on left side of chest with arrow pointing upward.   Rub patch adhesive wings for 2 minutes.Remove white label marked "1".  Remove white label marked "2".  Rub patch adhesive wings for 2 additional minutes.   While looking in a mirror, press and release  button in center of patch.  A Lepkowski green light will flash 3-4 times .  This will be your only indicator the monitor has been turned on.     Do not shower for the first 24 hours.  You may shower after the first 24 hours.   Press button if you feel a symptom. You will hear a Rodwell click.  Record Date, Time and Symptom in the Patient Log Book.   When you are ready to remove patch, follow instructions on last 2 pages of Patient Log Book.  Stick patch monitor onto last page of Patient Log Book.   Place  Patient Log Book in [place] box.  Use locking tab on box and tape box closed securely.  The Orange and [employer] has [employer] on it.  Please place in mailbox as soon as possible.  Your physician should have your test results approximately 7 days after the monitor has been mailed back to [facility].   Call [facility] Customer Care at [phone] if you have questions regarding your ZIO XT patch monitor.  Call them immediately if you see an orange light blinking on your monitor.   If your monitor falls off in less than 4 days contact our Monitor department at [phone].  If your monitor becomes loose or falls off after 4 days call [employer] at [phone] for suggestions on securing your monitor.

## 2023-12-17 ENCOUNTER — Ambulatory Visit (INDEPENDENT_AMBULATORY_CARE_PROVIDER_SITE_OTHER): Payer: 59

## 2023-12-24 DIAGNOSIS — R002 Palpitations: Secondary | ICD-10-CM | POA: Diagnosis not present

## 2024-01-03 DIAGNOSIS — G4733 Obstructive sleep apnea (adult) (pediatric): Secondary | ICD-10-CM | POA: Diagnosis not present

## 2024-01-03 DIAGNOSIS — J449 Chronic obstructive pulmonary disease, unspecified: Secondary | ICD-10-CM | POA: Diagnosis not present

## 2024-01-04 DIAGNOSIS — J449 Chronic obstructive pulmonary disease, unspecified: Secondary | ICD-10-CM | POA: Diagnosis not present

## 2024-01-06 ENCOUNTER — Encounter: Payer: Self-pay | Admitting: *Deleted

## 2024-01-06 ENCOUNTER — Ambulatory Visit (INDEPENDENT_AMBULATORY_CARE_PROVIDER_SITE_OTHER)

## 2024-01-06 VITALS — Ht 68.0 in | Wt 230.0 lb

## 2024-01-06 DIAGNOSIS — Z0000 Encounter for general adult medical examination without abnormal findings: Secondary | ICD-10-CM

## 2024-01-06 DIAGNOSIS — Z1211 Encounter for screening for malignant neoplasm of colon: Secondary | ICD-10-CM

## 2024-01-06 NOTE — Progress Notes (Signed)
 Because this visit was a virtual/telehealth visit,  certain criteria was not obtained, such a blood pressure, CBG if applicable, and timed get up and go. Any medications not marked as "taking" were not mentioned during the medication reconciliation part of the visit. Any vitals not documented were not able to be obtained due to this being a telehealth visit or patient was unable to self-report a recent blood pressure reading due to a lack of equipment at home via telehealth. Vitals that have been documented are verbally provided by the patient.   Subjective:   Ricardo Burch is a 64 y.o. who presents for a Medicare Wellness preventive visit.  Visit Complete: Virtual I connected with  Ricardo Burch on 01/06/24 by a audio enabled telemedicine application and verified that I am speaking with the correct person using two identifiers.  Patient Location: Home  Provider Location: Home Office  I discussed the limitations of evaluation and management by telemedicine. The patient expressed understanding and agreed to proceed.  Vital Signs: Because this visit was a virtual/telehealth visit, some criteria may be missing or patient reported. Any vitals not documented were not able to be obtained and vitals that have been documented are patient reported.  VideoError- Librarian, academic were attempted between this provider and patient, however failed, due to patient having technical difficulties OR patient did not have access to video capability.  We continued and completed visit with audio only.   AWV Questionnaire: No: Patient Medicare AWV questionnaire was not completed prior to this visit.  Cardiac Risk Factors include: dyslipidemia;sedentary lifestyle;obesity (BMI >30kg/m2);male gender;hypertension;Other (see comment), Risk factor comments: emphysema     Objective:    Today's Vitals   01/06/24 1133  Weight: 230 lb (104.3 kg)  Height: 5\' 8"  (1.727 m)   Body mass index is  34.97 kg/m.     01/06/2024   11:36 AM 09/26/2023    9:42 AM 09/25/2023    8:24 PM 01/02/2023    1:12 PM 08/26/2022    9:39 AM 01/29/2022    9:24 AM 12/31/2021   11:25 AM  Advanced Directives  Does Patient Have a Medical Advance Directive? No No No No No No No  Does patient want to make changes to medical advance directive?       No - Patient declined  Would patient like information on creating a medical advance directive? No - Patient declined No - Patient declined  No - Patient declined  No - Patient declined No - Patient declined    Current Medications (verified) Outpatient Encounter Medications as of 01/06/2024  Medication Sig   albuterol (VENTOLIN HFA) 108 (90 Base) MCG/ACT inhaler INHALE 1-2 PUFFS BY MOUTH EVERY 6 HOURS AS NEEDED FOR WHEEZING OR FOR SHORTNESS OF BREATH   amLODipine (NORVASC) 5 MG tablet TAKE 1 TABLET BY MOUTH DAILY   ciprofloxacin-dexamethasone (CIPRODEX) OTIC suspension Place 4 drops into the left ear 2 (two) times daily.   FARXIGA 10 MG TABS tablet Take 10 mg by mouth daily.   fluticasone (FLONASE) 50 MCG/ACT nasal spray INSTILL TWO (2) SPRAYS IN EACH NOSTRIL TWICE DAILY   furosemide (LASIX) 20 MG tablet Take 20 mg by mouth daily.   OXYGEN Inhale 3 L into the lungs daily. continuous   OZEMPIC, 1 MG/DOSE, 4 MG/3ML SOPN SMARTSIG:0.75 Milliliter(s) SUB-Q Once a Week   roflumilast (DALIRESP) 500 MCG TABS tablet TAKE 1 TABLET ( ) BY MOUTH DAILY   sildenafil (VIAGRA) 50 MG tablet Take 1 tablet (50 mg total) by mouth  daily as needed for erectile dysfunction.   spironolactone (ALDACTONE) 25 MG tablet Take 25 mg by mouth daily.   TRELEGY ELLIPTA 200-62.5-25 MCG/ACT AEPB INHALE ONE (1) PUFF BY MOUTH DAILY   [DISCONTINUED] cyclobenzaprine (FLEXERIL) 5 MG tablet Take 1 tablet (5 mg total) by mouth 2 (two) times daily as needed for muscle spasms.   No facility-administered encounter medications on file as of 01/06/2024.    Allergies (verified) Apresoline [hydralazine]    History: Past Medical History:  Diagnosis Date   Allergy    CHF (congestive heart failure) (HCC)    Chronic kidney disease    COPD (chronic obstructive pulmonary disease) (HCC) Dx 2015   Depression    Eczema    since childhood    Emphysema of lung (HCC)    Erectile dysfunction 08/18/2019   GERD (gastroesophageal reflux disease) 01/25/2020   Hyperlipidemia 01/12/2018   Hypertension    Oxygen deficiency    Screening for HIV (human immunodeficiency virus)    Sleep apnea    CPAP   Substance abuse (HCC)    MJ, cocaine   Testicular failure 07/22/2019   Past Surgical History:  Procedure Laterality Date   COLONOSCOPY WITH PROPOFOL N/A 06/25/2017   Procedure: COLONOSCOPY WITH PROPOFOL;  Surgeon: Corbin Ade, MD;  Location: [facility];  Service: Endoscopy;  Laterality: N/A;  2:00pm   MULTIPLE EXTRACTIONS WITH ALVEOLOPLASTY N/A 03/22/2016   Procedure: MULTIPLE EXTRACTION WITH ALVEOLOPLASTY;  Surgeon: Ocie Doyne, DDS;  Location: [facility] OR;  Service: Oral Surgery;  Laterality: N/A;   NO PAST SURGERIES     POLYPECTOMY  06/25/2017   Procedure: POLYPECTOMY;  Surgeon: Corbin Ade, MD;  Location: [facility];  Service: Endoscopy;;  colon   Family History  Problem Relation Age of Onset   Arthritis Mother    Hypertension Mother    Stroke Mother        age 47   Cerebral aneurysm Father    Alcohol abuse Father    Cancer Brother    Diabetes Neg Hx    Heart disease Neg Hx    Social History   Socioeconomic History   Marital status: Single    Spouse name: Not on file   Number of children: 5   Years of education: 12   Highest education level: Associate degree: occupational, Scientist, product/process development, or vocational program  Occupational History   Occupation: Unemployed     Comment: car wash details  Tobacco Use   Smoking status: Former    Current packs/day: 0.00    Average packs/day: 1 pack/day for 38.0 years (38.0 ttl pk-yrs)    Types: Cigarettes    Start date: 02/20/1982    Quit date:  02/21/2020    Years since quitting: 3.8   Smokeless tobacco: Never  Vaping Use   Vaping status: Never Used  Substance and Sexual Activity   Alcohol use: Yes    Alcohol/week: 1.0 standard drink of alcohol    Types: 1 Cans of beer per week    Comment: Socially x 1/month currently (as of 01/25/20); previously: occassionally: weekends, 6-pack or less on a day; or half pint of liquior   Drug use: Not Currently    Types: Cocaine, Marijuana    Comment: 01/25/20-marijuana few weeks ago, no cocaine   Sexual activity: Never  Other Topics Concern   Not on file  Social History Narrative    Lives alone.    5 daughters 53, 59 yo twins, eldest 2 are married live in [place].  Retired.Girlfriend works in Programmer, applications.   Social Drivers of Corporate investment banker Strain: Low Risk  (01/06/2024)   Overall Financial Resource Strain (CARDIA)    Difficulty of Paying Living Expenses: Not hard at all  Food Insecurity: No Food Insecurity (01/06/2024)   Hunger Vital Sign    Worried About Running Out of Food in the Last Year: Never true    Ran Out of Food in the Last Year: Never true  Transportation Needs: No Transportation Needs (01/06/2024)   PRAPARE - Administrator, Civil Service (Medical): No    Lack of Transportation (Non-Medical): No  Physical Activity: Sufficiently Active (01/06/2024)   Exercise Vital Sign    Days of Exercise per Week: 4 days    Minutes of Exercise per Session: 60 min  Stress: No Stress Concern Present (01/06/2024)   [employer] of Occupational Health - Occupational Stress Questionnaire    Feeling of Stress : Only a little  Social Connections: Moderately Integrated (01/06/2024)   Social Connection and Isolation Panel [NHANES]    Frequency of Communication with Friends and Family: More than three times a week    Frequency of Social Gatherings with Friends and Family: More than three times a week    Attends Religious Services: More than 4 times per year    Active Member  of [employer] or Organizations: No    Attends Engineer, structural: Never    Marital Status: Married    Tobacco Counseling Counseling given: Yes    Clinical Intake:  Pre-visit preparation completed: Yes  Pain : No/denies pain     BMI - recorded: 34.97 Nutritional Risks: None Diabetes: No  How often do you need to have someone help you when you read instructions, pamphlets, or other written materials from your doctor or pharmacy?: 1 - Never  Interpreter Needed?: No  Information entered by :: Maryjean Ka CMA   Activities of Daily Living     12/29/2023   12:22 PM  In your present state of health, do you have any difficulty performing the following activities:  Hearing? 0  Vision? 0  Difficulty concentrating or making decisions? 1  Walking or climbing stairs? 1  Dressing or bathing? 0  Doing errands, shopping? 0  Preparing Food and eating ? N  Using the Toilet? N  In the past six months, have you accidently leaked urine? N  Do you have problems with loss of bowel control? N  Managing your Medications? N  Managing your Finances? Y  Housekeeping or managing your Housekeeping? N    Patient Care Team: Anabel Halon, MD as PCP - General (Internal Medicine) Wendall Stade, MD as PCP - Cardiology (Cardiology) Jena Gauss Gerrit Friends, MD as Consulting Physician (Gastroenterology) Randa Spike Kelton Pillar, LCSW as [facility] Care Management (Licensed Clinical Social Worker) [facility] as Therapist, music (Optometry)  Indicate any recent Medical Services you may have received from other than [facility] providers in the past year (date may be approximate).     Assessment:   This is a routine wellness examination for Lawrnce.  Hearing/Vision screen Hearing Screening - Comments:: Patient denies any hearing difficulties.   Vision Screening - Comments:: Wears rx glasses - up to date with routine eye exams  Sees [facility] [place]   Goals Addressed              This Visit's Progress    Patient Stated       To remain as active healthy  and independent as I currently am        Depression Screen     01/06/2024   11:37 AM 09/24/2023    9:10 AM 08/12/2023    8:22 AM 04/10/2023   10:03 AM 01/02/2023    1:12 PM 12/24/2022   11:28 AM 11/11/2022   11:03 AM  PHQ 2/9 Scores  PHQ - 2 Score 0 0 0 0 0 1 0  PHQ- 9 Score 0          Fall Risk     12/29/2023   12:22 PM 09/24/2023    9:10 AM 08/12/2023    8:22 AM 04/10/2023   10:03 AM 01/02/2023    1:12 PM  Fall Risk   Falls in the past year? 0 0 0 0 0  Number falls in past yr: 0 0 0 0 0  Injury with Fall? 0 0 0 0 0  Risk for fall due to : No Fall Risks No Fall Risks   No Fall Risks  Follow up Falls prevention discussed;Falls evaluation completed Falls evaluation completed   Falls evaluation completed    MEDICARE RISK AT HOME:  Medicare Risk at Home Any stairs in or around the home?: Yes (reviewed with patient again 01/06/24.Marland KitchenAW) If so, are there any without handrails?: (Patient-Rptd) No Home free of loose throw rugs in walkways, pet beds, electrical cords, etc?: (Patient-Rptd) Yes Adequate lighting in your home to reduce risk of falls?: (Patient-Rptd) Yes Life alert?: (Patient-Rptd) No Use of a cane, walker or w/c?: (Patient-Rptd) No Grab bars in the bathroom?: (Patient-Rptd) No Shower chair or bench in shower?: (Patient-Rptd) No Elevated toilet seat or a handicapped toilet?: (Patient-Rptd) No  TIMED UP AND GO:  Was the test performed?  No  Cognitive Function: 6CIT completed    01/02/2023    1:13 PM  MMSE - Mini Mental State Exam  Not completed: Unable to complete        01/06/2024   11:37 AM 01/02/2023    1:13 PM 12/31/2021   11:23 AM  6CIT Screen  What Year? 0 points 0 points 0 points  What month? 0 points 0 points 0 points  What time? 0 points 0 points 0 points  Count back from 20 0 points 0 points 0 points  Months in reverse 0 points 0 points 0 points  Repeat phrase 0 points 0  points 0 points  Total Score 0 points 0 points 0 points    Immunizations Immunization History  Administered Date(s) Administered   Influenza Inj Mdck Quad With Preservative 08/09/2020   Influenza, High Dose Seasonal PF 06/29/2018   Influenza,inj,Quad PF,6+ Mos 09/15/2017, 08/25/2019, 08/25/2019, 11/02/2021, 07/09/2022   Influenza-Unspecified 08/04/2023   Moderna Covid-19 Vaccine Bivalent Booster 42yrs & up 08/16/2021, 11/11/2022   Moderna SARS-COV2 Booster Vaccination 08/04/2023   Moderna Sars-Covid-2 Vaccination 01/24/2020, 02/21/2020, 10/03/2020   Pneumococcal Conjugate-13 09/15/2017   Pneumococcal Polysaccharide-23 06/15/2019   Tdap 05/26/2015, 01/29/2022   Zoster Recombinant(Shingrix) 11/04/2020, 02/14/2021    Screening Tests Health Maintenance  Topic Date Due   Colonoscopy  06/25/2022   COVID-19 Vaccine (6 - 2024-25 season) 09/29/2023   Lung Cancer Screening  08/24/2024   Medicare Annual Wellness (AWV)  01/05/2025   Pneumococcal Vaccine 10-66 Years old (3 of 3 - PPSV23 or PCV20) 09/23/2028   DTaP/Tdap/Td (3 - Td or Tdap) 01/30/2032   INFLUENZA VACCINE  Completed   Hepatitis C Screening  Completed   HIV Screening  Completed   Zoster Vaccines- Shingrix  Completed  HPV VACCINES  Aged Out    Health Maintenance  Health Maintenance Due  Topic Date Due   Colonoscopy  06/25/2022   COVID-19 Vaccine (6 - 2024-25 season) 09/29/2023   Health Maintenance Items Addressed: Referral sent to GI for colonoscopy  Additional Screening:  Vision Screening: Recommended annual ophthalmology exams for early detection of glaucoma and other disorders of the eye.  Dental Screening: Recommended annual dental exams for proper oral hygiene  Community Resource Referral / Chronic Care Management: CRR required this visit?  No   CCM required this visit?  No     Plan:     I have personally reviewed and noted the following in the patient's chart:   Medical and social history Use of  alcohol, tobacco or illicit drugs  Current medications and supplements including opioid prescriptions. Patient is not currently taking opioid prescriptions. Functional ability and status Nutritional status Physical activity Advanced directives List of other physicians Hospitalizations, surgeries, and ER visits in previous 12 months Vitals Screenings to include cognitive, depression, and falls Referrals and appointments  In addition, I have reviewed and discussed with patient certain preventive protocols, quality metrics, and best practice recommendations. A written personalized care plan for preventive services as well as general preventive health recommendations were provided to patient.     Jordan Hawks Saba Gomm, CMA   01/06/2024   After Visit Summary: (MyChart) Due to this being a telephonic visit, the after visit summary with patients personalized plan was offered to patient via MyChart   Notes: Nothing significant to report at this time.

## 2024-01-06 NOTE — Patient Instructions (Signed)
 [name] , Thank you for taking time to come for your Medicare Wellness Visit. I appreciate your ongoing commitment to your health goals. Please review the following plan we discussed and let me know if I can assist you in the future.   Referrals/Orders/Follow-Ups/Clinician Recommendations:  Next Medicare Annual Wellness Visit:   January 10, 2025 at 11:20 am video visit  You have been referred to [facility].If you haven't heard from them within the next week, please call them to schedule your appointment.    Address: [address] [place], [place] [address] Phone: [phone]   This is a list of the screening recommended for you and due dates:  Health Maintenance  Topic Date Due   Colon Cancer Screening  06/25/2022   COVID-19 Vaccine (6 - 2024-25 season) 09/29/2023   Screening for Lung Cancer  08/24/2024   Medicare Annual Wellness Visit  01/05/2025   Pneumococcal Vaccination (3 of 3 - PPSV23 or PCV20) 09/23/2028   DTaP/Tdap/Td vaccine (3 - Td or Tdap) 01/30/2032   Flu Shot  Completed   Hepatitis C Screening  Completed   HIV Screening  Completed   Zoster (Shingles) Vaccine  Completed   HPV Vaccine  Aged Out    Advanced directives: (Declined) Advance directive discussed with you today. Even though you declined this today, please call our office should you change your mind, and we can give you the proper paperwork for you to fill out.  Next Medicare Annual Wellness Visit scheduled for next year: yes  Understanding Your Risk for Falls Millions of people have serious injuries from falls each year. It is important to understand your risk of falling. Talk with your health care provider about your risk and what you can do to lower it. If you do have a serious fall, make sure to tell your provider. Falling once raises your risk of falling again. How can falls affect me? Serious injuries from falls are common. These include: Broken bones, such as hip fractures. Head injuries,  such as traumatic brain injuries (TBI) or concussions. A fear of falling can cause you to avoid activities and stay at home. This can make your muscles weaker and raise your risk for a fall. What can increase my risk? There are a number of risk factors that increase your risk for falling. The more risk factors you have, the higher your risk of falling. Serious injuries from a fall happen most often to people who are older than 61 years old. Teenagers and young adults ages 44-29 are also at higher risk. Common risk factors include: Weakness in the lower body. Being generally weak or confused due to long-term (chronic) illness. Dizziness or balance problems. Poor vision. Medicines that cause dizziness or drowsiness. These may include: Medicines for your blood pressure, heart, anxiety, insomnia, or swelling (edema). Pain medicines. Muscle relaxants. Other risk factors include: Drinking alcohol. Having had a fall in the past. Having foot pain or wearing improper footwear. Working at a dangerous job. Having any of the following in your home: Tripping hazards, such as floor clutter or loose rugs. Poor lighting. Pets. Having dementia or memory loss. What actions can I take to lower my risk of falling?     Physical activity Stay physically fit. Do strength and balance exercises. Consider taking a regular class to build strength and balance. Yoga and tai chi are good options. Vision Have your eyes checked every year and your prescription for glasses or contacts updated as needed. Shoes and walking aids  Wear non-skid shoes. Wear shoes that have rubber soles and low heels. Do not wear high heels. Do not walk around the house in socks or slippers. Use a cane or walker as told by your provider. Home safety Attach secure railings on both sides of your stairs. Install grab bars for your bathtub, shower, and toilet. Use a non-skid mat in your bathtub or shower. Attach bath mats securely with  double-sided, non-slip rug tape. Use good lighting in all rooms. Keep a flashlight near your bed. Make sure there is a clear path from your bed to the bathroom. Use night-lights. Do not use throw rugs. Make sure all carpeting is taped or tacked down securely. Remove all clutter from walkways and stairways, including extension cords. Repair uneven or broken steps and floors. Avoid walking on icy or slippery surfaces. Walk on the grass instead of on icy or slick sidewalks. Use ice melter to get rid of ice on walkways in the winter. Use a cordless phone. Questions to ask your health care provider Can you help me check my risk for a fall? Do any of my medicines make me more likely to fall? Should I take a vitamin D supplement? What exercises can I do to improve my strength and balance? Should I make an appointment to have my vision checked? Do I need a bone density test to check for weak bones (osteoporosis)? Would it help to use a cane or a walker? Where to find more information Centers for Disease Control and Prevention, STEADI: TonerPromos.no Community-Based Fall Prevention Programs: TonerPromos.no [employer] on Aging: BaseRingTones.pl Contact a health care provider if: You fall at home. You are afraid of falling at home. You feel weak, drowsy, or dizzy. This information is not intended to replace advice given to you by your health care provider. Make sure you discuss any questions you have with your health care provider. Document Revised: 06/24/2022 Document Reviewed: 06/24/2022 Elsevier Patient Education  2024 [employer].

## 2024-01-12 ENCOUNTER — Other Ambulatory Visit: Payer: Self-pay | Admitting: Internal Medicine

## 2024-01-12 ENCOUNTER — Telehealth: Payer: Self-pay

## 2024-01-12 DIAGNOSIS — G4733 Obstructive sleep apnea (adult) (pediatric): Secondary | ICD-10-CM | POA: Diagnosis not present

## 2024-01-12 DIAGNOSIS — R7303 Prediabetes: Secondary | ICD-10-CM

## 2024-01-12 DIAGNOSIS — E782 Mixed hyperlipidemia: Secondary | ICD-10-CM

## 2024-01-12 DIAGNOSIS — F3341 Major depressive disorder, recurrent, in partial remission: Secondary | ICD-10-CM

## 2024-01-12 DIAGNOSIS — J449 Chronic obstructive pulmonary disease, unspecified: Secondary | ICD-10-CM | POA: Diagnosis not present

## 2024-01-12 DIAGNOSIS — E559 Vitamin D deficiency, unspecified: Secondary | ICD-10-CM

## 2024-01-12 DIAGNOSIS — I10 Essential (primary) hypertension: Secondary | ICD-10-CM

## 2024-01-12 DIAGNOSIS — Z125 Encounter for screening for malignant neoplasm of prostate: Secondary | ICD-10-CM

## 2024-01-12 DIAGNOSIS — J432 Centrilobular emphysema: Secondary | ICD-10-CM

## 2024-01-12 NOTE — Telephone Encounter (Signed)
 Spoke with pt informed him lab orders are in for him to come 3 days before next visit.

## 2024-01-12 NOTE — Telephone Encounter (Signed)
 Copied from CRM [id number]. Topic: Appointments - Scheduling Inquiry for Clinic >> Jan 12, 2024  1:17 PM Marlow Baars wrote: Reason for CRM: The patient called in questioning if he is supposed to have lab work done before his upcoming appt. Please assist patient further as he thought this was the case

## 2024-01-12 NOTE — Telephone Encounter (Signed)
 No future labs in chart, please advice which labs to order if needed before appt on 02/11/24?

## 2024-01-13 ENCOUNTER — Encounter: Payer: Self-pay | Admitting: Internal Medicine

## 2024-01-13 ENCOUNTER — Ambulatory Visit: Payer: Self-pay | Admitting: Internal Medicine

## 2024-01-13 ENCOUNTER — Ambulatory Visit (INDEPENDENT_AMBULATORY_CARE_PROVIDER_SITE_OTHER): Admitting: Internal Medicine

## 2024-01-13 VITALS — BP 106/72 | HR 105 | Ht 68.0 in | Wt 230.8 lb

## 2024-01-13 DIAGNOSIS — J9611 Chronic respiratory failure with hypoxia: Secondary | ICD-10-CM | POA: Diagnosis not present

## 2024-01-13 DIAGNOSIS — J101 Influenza due to other identified influenza virus with other respiratory manifestations: Secondary | ICD-10-CM | POA: Insufficient documentation

## 2024-01-13 DIAGNOSIS — J441 Chronic obstructive pulmonary disease with (acute) exacerbation: Secondary | ICD-10-CM | POA: Diagnosis not present

## 2024-01-13 DIAGNOSIS — R6889 Other general symptoms and signs: Secondary | ICD-10-CM | POA: Diagnosis not present

## 2024-01-13 MED ORDER — GUAIFENESIN-CODEINE 100-10 MG/5ML PO SOLN: 5.0000 mL | Freq: Three times a day (TID) | ORAL | 0 refills | Status: DC | PRN

## 2024-01-13 MED ORDER — OSELTAMIVIR PHOSPHATE 75 MG PO CAPS: 75.0000 mg | ORAL_CAPSULE | Freq: Two times a day (BID) | ORAL | 0 refills | Status: DC

## 2024-01-13 MED ORDER — PREDNISONE 20 MG PO TABS: 20.0000 mg | ORAL_TABLET | Freq: Every day | ORAL | 0 refills | Status: DC

## 2024-01-13 NOTE — Assessment & Plan Note (Signed)
 Was doing well on room air till yesterday Uses continuous home O2 at 3-4 LPM due to hypoxia since yesterday Due to COPD exacerbation

## 2024-01-13 NOTE — Assessment & Plan Note (Signed)
 Recent worsening of cough and wheezing likely due to acute bronchitis/COPD in the setting of influenza infection Started Tamiflu and oral prednisone Continue Trelegy and as needed albuterol for dyspnea or wheezing Tessalon as needed for cough Continue Flonase for nasal congestion

## 2024-01-13 NOTE — Telephone Encounter (Addendum)
 Copied from CRM [id number]. Topic: Clinical - Red Word Triage >> Jan 13, 2024  8:23 AM Truddie Crumble wrote: Reason for CRM: body aches, coughing, nose running, fever 100.4, chest burning  The patient reported a productive cough with yellowish sputum, body aches that feels like he's been lifting weights, congestion in his chest that causes burning when he coughs.  He has a history of COPD.  His oxygen saturation has been 87-89% although he denied increased shortness of breath.  His normal saturation is 90-92%.  He has not been using his oxygen 3 L as he feels like he doesn't need it.  He had not taken his temperature today at time of triage.  He has taken over the counter cold medicine that has been helpful and he feels "slightly better."  His at home Covid test was negative.  He was scheduled for a same day appointment for further evaluation.    Reason for Disposition  [1] Known COPD or other severe lung disease (i.e., bronchiectasis, cystic fibrosis, lung surgery) AND [2] worsening symptoms (i.e., increased sputum purulence or amount, increased breathing difficulty  Answer Assessment - Initial Assessment Questions 1. ONSET: "When did the cough begin?"      01/12/24 2. SEVERITY: "How bad is the cough today?"      severe 3. SPUTUM: "Describe the color of your sputum" (none, dry cough; clear, white, yellow, green)     Yellowish  4. HEMOPTYSIS: "Are you coughing up any blood?" If so ask: "How much?" (flecks, streaks, tablespoons, etc.)     None  5. DIFFICULTY BREATHING: "Are you having difficulty breathing?" If Yes, ask: "How bad is it?" (e.g., mild, moderate, severe)    - MILD: No SOB at rest, mild SOB with walking, speaks normally in sentences, can lie down, no retractions, pulse < 100.    - MODERATE: SOB at rest, SOB with minimal exertion and prefers to sit, cannot lie down flat, speaks in phrases, mild retractions, audible wheezing, pulse 100-120.    - SEVERE: Very SOB at rest, speaks in single words,  struggling to breathe, sitting hunched forward, retractions, pulse > 120      Denied shortness of breath 6. FEVER: "Do you have a fever?" If Yes, ask: "What is your temperature, how was it measured, and when did it start?"     Has not taken temperature today  7. CARDIAC HISTORY: "Do you have any history of heart disease?" (e.g., heart attack, congestive heart failure)      CHF 8. LUNG HISTORY: "Do you have any history of lung disease?"  (e.g., pulmonary embolus, asthma, emphysema)     COPD 10. OTHER SYMPTOMS: "Do you have any other symptoms?" (e.g., runny nose, wheezing, chest pain)       Body aches, burning in chest with coughing  Protocols used: Cough - Acute Productive-A-AH

## 2024-01-13 NOTE — Patient Instructions (Signed)
 Please start taking Tamiflu as prescribed.  Please start taking prednisone as prescribed.  Please take guaifenesin-codeine syrup as needed for cough.

## 2024-01-13 NOTE — Progress Notes (Signed)
 Acute Office Visit  Subjective:    Patient ID: Ricardo Burch, male    DOB: May 02, 1961, 63 y.o.   MRN: [id number]  Chief Complaint  Patient presents with   Cough    Pt reports sx of cough and congestion, started feeling ill on 01/12/24, feels fatigued and headache.     HPI Patient is in today for complaint of cough with clear expectoration, nasal congestion, sinus pressure related headache and fatigue since yesterday.  Ricardo Burch also reports worsening of his dyspnea since yesterday.  Denies any fever or chills.  His home COVID test was negative.  His wife had flulike symptoms in the last week.  Ricardo Burch has history of recurrent COPD exacerbations and CHF.  Ricardo Burch has home O2 for chronic hypoxic respiratory failure.  Past Medical History:  Diagnosis Date   Allergy    CHF (congestive heart failure) (HCC)    Chronic kidney disease    COPD (chronic obstructive pulmonary disease) (HCC) Dx 2015   Depression    Eczema    since childhood    Emphysema of lung (HCC)    Erectile dysfunction 08/18/2019   GERD (gastroesophageal reflux disease) 01/25/2020   Hyperlipidemia 01/12/2018   Hypertension    Oxygen deficiency    Screening for HIV (human immunodeficiency virus)    Sleep apnea    CPAP   Substance abuse (HCC)    MJ, cocaine   Testicular failure 07/22/2019    Past Surgical History:  Procedure Laterality Date   COLONOSCOPY WITH PROPOFOL N/A 06/25/2017   Procedure: COLONOSCOPY WITH PROPOFOL;  Surgeon: Corbin Ade, MD;  Location: [facility];  Service: Endoscopy;  Laterality: N/A;  2:00pm   MULTIPLE EXTRACTIONS WITH ALVEOLOPLASTY N/A 03/22/2016   Procedure: MULTIPLE EXTRACTION WITH ALVEOLOPLASTY;  Surgeon: Ocie Doyne, DDS;  Location: [facility] OR;  Service: Oral Surgery;  Laterality: N/A;   NO PAST SURGERIES     POLYPECTOMY  06/25/2017   Procedure: POLYPECTOMY;  Surgeon: Corbin Ade, MD;  Location: [facility];  Service: Endoscopy;;  colon    Family History  Problem Relation Age of Onset    Arthritis Mother    Hypertension Mother    Stroke Mother        age 60   Cerebral aneurysm Father    Alcohol abuse Father    Cancer Brother    Diabetes Neg Hx    Heart disease Neg Hx     Social History   Socioeconomic History   Marital status: Single    Spouse name: Not on file   Number of children: 5   Years of education: 12   Highest education level: Associate degree: occupational, Scientist, product/process development, or vocational program  Occupational History   Occupation: Unemployed     Comment: car wash details  Tobacco Use   Smoking status: Former    Current packs/day: 0.00    Average packs/day: 1 pack/day for 38.0 years (38.0 ttl pk-yrs)    Types: Cigarettes    Start date: 02/20/1982    Quit date: 02/21/2020    Years since quitting: 3.8   Smokeless tobacco: Never  Vaping Use   Vaping status: Never Used  Substance and Sexual Activity   Alcohol use: Yes    Alcohol/week: 1.0 standard drink of alcohol    Types: 1 Cans of beer per week    Comment: Socially x 1/month currently (as of 01/25/20); previously: occassionally: weekends, 6-pack or less on a day; or half pint of liquior   Drug use: Not  Currently    Types: Cocaine, Marijuana    Comment: 01/25/20-marijuana few weeks ago, no cocaine   Sexual activity: Never  Other Topics Concern   Not on file  Social History Narrative    Lives alone.    5 daughters 84, 42 yo twins, eldest 2 are married live in [place]. Retired.Girlfriend works in Programmer, applications.   Social Drivers of Corporate investment banker Strain: Low Risk  (01/06/2024)   Overall Financial Resource Strain (CARDIA)    Difficulty of Paying Living Expenses: Not hard at all  Food Insecurity: No Food Insecurity (01/06/2024)   Hunger Vital Sign    Worried About Running Out of Food in the Last Year: Never true    Ran Out of Food in the Last Year: Never true  Transportation Needs: No Transportation Needs (01/06/2024)   PRAPARE - Administrator, Civil Service (Medical): No     Lack of Transportation (Non-Medical): No  Physical Activity: Sufficiently Active (01/06/2024)   Exercise Vital Sign    Days of Exercise per Week: 4 days    Minutes of Exercise per Session: 60 min  Stress: No Stress Concern Present (01/06/2024)   [employer] of Occupational Health - Occupational Stress Questionnaire    Feeling of Stress : Only a little  Social Connections: Moderately Integrated (01/06/2024)   Social Connection and Isolation Panel [NHANES]    Frequency of Communication with Friends and Family: More than three times a week    Frequency of Social Gatherings with Friends and Family: More than three times a week    Attends Religious Services: More than 4 times per year    Active Member of [employer] or Organizations: No    Attends Banker Meetings: Never    Marital Status: Married  Catering manager Violence: Not At Risk (01/06/2024)   Humiliation, Afraid, Rape, and Kick questionnaire    Fear of Current or Ex-Partner: No    Emotionally Abused: No    Physically Abused: No    Sexually Abused: No    Outpatient Medications Prior to Visit  Medication Sig Dispense Refill   albuterol (VENTOLIN HFA) 108 (90 Base) MCG/ACT inhaler INHALE 1-2 PUFFS BY MOUTH EVERY 6 HOURS AS NEEDED FOR WHEEZING OR FOR SHORTNESS OF BREATH 8.5 g 10   amLODipine (NORVASC) 5 MG tablet TAKE 1 TABLET BY MOUTH DAILY 30 tablet 10   FARXIGA 10 MG TABS tablet Take 10 mg by mouth daily.     fluticasone (FLONASE) 50 MCG/ACT nasal spray INSTILL TWO (2) SPRAYS IN EACH NOSTRIL TWICE DAILY 16 g 10   furosemide (LASIX) 20 MG tablet Take 20 mg by mouth daily.     OXYGEN Inhale 3 L into the lungs daily. continuous     OZEMPIC, 1 MG/DOSE, 4 MG/3ML SOPN SMARTSIG:0.75 Milliliter(s) SUB-Q Once a Week     roflumilast (DALIRESP) 500 MCG TABS tablet TAKE 1 TABLET ( ) BY MOUTH DAILY 30 tablet 11   sildenafil (VIAGRA) 50 MG tablet Take 1 tablet (50 mg total) by mouth daily as needed for erectile dysfunction. 30 tablet  1   spironolactone (ALDACTONE) 25 MG tablet Take 25 mg by mouth daily.     TRELEGY ELLIPTA 200-62.5-25 MCG/ACT AEPB INHALE ONE (1) PUFF BY MOUTH DAILY 60 each 10   ciprofloxacin-dexamethasone (CIPRODEX) OTIC suspension Place 4 drops into the left ear 2 (two) times daily. 7.5 mL 0   No facility-administered medications prior to visit.    Allergies  Allergen Reactions  Apresoline [Hydralazine] Other (See Comments)    Headache     Review of Systems  Constitutional:  Positive for fatigue. Negative for chills and fever.  HENT:  Positive for congestion. Negative for sore throat.   Eyes:  Negative for pain and discharge.  Respiratory:  Positive for cough and shortness of breath.   Cardiovascular:  Negative for chest pain and palpitations.  Gastrointestinal:  Negative for diarrhea, nausea and vomiting.  Endocrine: Negative for polydipsia and polyuria.  Genitourinary:  Negative for dysuria and hematuria.  Musculoskeletal:  Positive for back pain. Negative for neck pain and neck stiffness.  Skin:  Negative for rash.  Neurological:  Negative for dizziness, weakness, numbness and headaches.  Psychiatric/Behavioral:  Negative for agitation and behavioral problems.        Objective:    Physical Exam Vitals reviewed.  Constitutional:      General: Ricardo Burch is not in acute distress.    Appearance: Ricardo Burch is not diaphoretic.  HENT:     Head: Normocephalic and atraumatic.     Nose: Congestion present.     Mouth/Throat:     Mouth: Mucous membranes are moist.  Eyes:     General: No scleral icterus.    Extraocular Movements: Extraocular movements intact.  Cardiovascular:     Rate and Rhythm: Regular rhythm. Tachycardia present.     Heart sounds: Normal heart sounds. No murmur heard. Pulmonary:     Breath sounds: Wheezing (Mild, diffuse) present. No rales.     Comments: On home O2 - 3 lpm Musculoskeletal:     Cervical back: Neck supple. No tenderness.     Lumbar back: Tenderness (right  paraspinal) present. Decreased range of motion.     Right lower leg: No edema.     Left lower leg: No edema.  Skin:    General: Skin is warm.     Findings: No rash.  Neurological:     General: No focal deficit present.     Mental Status: Ricardo Burch is alert and oriented to person, place, and time.  Psychiatric:        Mood and Affect: Mood normal.        Behavior: Behavior normal.     BP 106/72   Pulse (!) 105   Ht 5\' 8"  (1.727 m)   Wt 230 lb 12.8 oz (104.7 kg)   SpO2 90% Comment: set on 3  BMI 35.09 kg/m  Wt Readings from Last 3 Encounters:  01/13/24 230 lb 12.8 oz (104.7 kg)  01/06/24 230 lb (104.3 kg)  12/09/23 230 lb 3.2 oz (104.4 kg)        Assessment & Plan:   Problem List Items Addressed This Visit       Respiratory   COPD exacerbation (HCC)   Recent worsening of cough and wheezing likely due to acute bronchitis/COPD in the setting of influenza infection Started Tamiflu and oral prednisone Continue Trelegy and as needed albuterol for dyspnea or wheezing Tessalon as needed for cough Continue Flonase for nasal congestion      Relevant Medications   guaiFENesin-codeine 100-10 MG/5ML syrup   predniSONE (DELTASONE) 20 MG tablet   Chronic respiratory failure with hypoxia (HCC)   Was doing well on room air till yesterday Uses continuous home O2 at 3-4 LPM due to hypoxia since yesterday Due to COPD exacerbation      Influenza A - Primary   Rapid flu test positive Started Tamiflu Maintain adequate hydration      Relevant Medications  oseltamivir (TAMIFLU) 75 MG capsule   guaiFENesin-codeine 100-10 MG/5ML syrup   Other Relevant Orders   Veritor Flu A/B Waived     Meds ordered this encounter  Medications   oseltamivir (TAMIFLU) 75 MG capsule    Sig: Take 1 capsule (75 mg total) by mouth 2 (two) times daily.    Dispense:  10 capsule    Refill:  0   guaiFENesin-codeine 100-10 MG/5ML syrup    Sig: Take 5 mLs by mouth 3 (three) times daily as needed for  cough.    Dispense:  120 mL    Refill:  0   predniSONE (DELTASONE) 20 MG tablet    Sig: Take 1 tablet (20 mg total) by mouth daily with breakfast.    Dispense:  5 tablet    Refill:  0     Rondle Lohse Concha Se, MD

## 2024-01-13 NOTE — Assessment & Plan Note (Signed)
 Rapid flu test positive Started Tamiflu Maintain adequate hydration

## 2024-01-15 LAB — VERITOR FLU A/B WAIVED
Influenza A: POSITIVE — AB
Influenza B: NEGATIVE

## 2024-01-19 ENCOUNTER — Ambulatory Visit: Payer: Self-pay | Admitting: Internal Medicine

## 2024-01-19 NOTE — Telephone Encounter (Signed)
  Chief Complaint: congestion Symptoms: yellow sputum Frequency: persistent  Pertinent Negatives: Patient denies chest pain, breathing difficulty Disposition: [] ED /[] Urgent Care (no appt availability in office) / [x] Appointment(In office/virtual)/ []  [facility]/ [] Home Care/ [] Refused Recommended Disposition /[] [facility]/ []  Follow-up with PCP Additional Notes:  Ricardo Burch requesting follow up visit for persistent flu symptoms with new productive cough of yellow sputum. Chest feels congested, denies pain.  Tuesday he was diagnosed with Flu, prescribed cough medicine Tamilfu, prednisone, reports medicine helpful but now concerned with new mucous. Denies shortness of breath and chest pain. Using home o2 3-4 liters since onset of upper respiratory symptoms. No conversational dyspnea noted, speaking in full sentences. Reports he continues to have muscle aches, runny nose, and low energy. Feels congestion is worsening. Acute visit scheduled with PCP on 01/20/24. Provided care advice as per protocol. Discussed reasons to call back and reasons to seek emergency evaluation.    Message from [place] [facility] sent at 01/19/2024  8:34 AM EDT  Summary: chest and nasal congestion   Copied From CRM [id number]. Reason for Triage: chest congestion, nasal congestion      Reason for Disposition  [1] Nasal discharge AND [2] present > 10 days  Protocols used: Influenza (Flu) Follow-up Call-A-AH

## 2024-01-20 ENCOUNTER — Telehealth: Payer: Self-pay | Admitting: Pulmonary Disease

## 2024-01-20 ENCOUNTER — Encounter: Payer: Self-pay | Admitting: Internal Medicine

## 2024-01-20 ENCOUNTER — Ambulatory Visit: Payer: 59 | Admitting: Physician Assistant

## 2024-01-20 ENCOUNTER — Ambulatory Visit (INDEPENDENT_AMBULATORY_CARE_PROVIDER_SITE_OTHER): Admitting: Internal Medicine

## 2024-01-20 VITALS — BP 111/65 | HR 97 | Ht 68.0 in | Wt 232.0 lb

## 2024-01-20 DIAGNOSIS — J441 Chronic obstructive pulmonary disease with (acute) exacerbation: Secondary | ICD-10-CM

## 2024-01-20 MED ORDER — METHYLPREDNISOLONE 4 MG PO TBPK: ORAL_TABLET | ORAL | 0 refills | Status: DC

## 2024-01-20 MED ORDER — PROMETHAZINE-DM 6.25-15 MG/5ML PO SYRP: 5.0000 mL | ORAL_SOLUTION | Freq: Four times a day (QID) | ORAL | 0 refills | Status: DC | PRN

## 2024-01-20 MED ORDER — AZITHROMYCIN 250 MG PO TABS: ORAL_TABLET | ORAL | 0 refills | Status: AC

## 2024-01-20 NOTE — Telephone Encounter (Signed)
 Patient mailed in disability parking placard form to be signed. Placed in Dr. Isaiah Serge box.

## 2024-01-20 NOTE — Progress Notes (Signed)
 Acute Office Visit  Subjective:    Patient ID: Ricardo Burch, male    DOB: [birth year], 63 y.o.   MRN: [id number]  Chief Complaint  Patient presents with   Care Management    Pt reports sx of nasal congestion from having flu last week, worsens at night.     HPI Patient is in today for complaint of cough with clear expectoration, nasal congestion, sinus pressure related headache and fatigue for the last 1 week. He also reports worsening of his dyspnea since then. He was given Tamiflu for influenza infection in the last week, which he has completed.  He has improvement in myalgias and fatigue, but still has nasal congestion and cough with clear expectoration.  Denies any fever or chills. His home COVID test was negative. He has history of recurrent COPD exacerbations and CHF. He has home O2 for chronic hypoxic respiratory failure.   Past Medical History:  Diagnosis Date   Allergy    CHF (congestive heart failure) (HCC)    Chronic kidney disease    COPD (chronic obstructive pulmonary disease) (HCC) Dx 2015   Depression    Eczema    since childhood    Emphysema of lung (HCC)    Erectile dysfunction 08/18/2019   GERD (gastroesophageal reflux disease) 01/25/2020   Hyperlipidemia 01/12/2018   Hypertension    Oxygen deficiency    Screening for HIV (human immunodeficiency virus)    Sleep apnea    CPAP   Substance abuse (HCC)    MJ, cocaine   Testicular failure 07/22/2019    Past Surgical History:  Procedure Laterality Date   COLONOSCOPY WITH PROPOFOL N/A 06/25/2017   Procedure: COLONOSCOPY WITH PROPOFOL;  Surgeon: Corbin Ade, MD;  Location: [facility];  Service: Endoscopy;  Laterality: N/A;  2:00pm   MULTIPLE EXTRACTIONS WITH ALVEOLOPLASTY N/A 03/22/2016   Procedure: MULTIPLE EXTRACTION WITH ALVEOLOPLASTY;  Surgeon: Ocie Doyne, DDS;  Location: [facility];  Service: Oral Surgery;  Laterality: N/A;   NO PAST SURGERIES     POLYPECTOMY  06/25/2017   Procedure: POLYPECTOMY;  Surgeon:  Corbin Ade, MD;  Location: [facility];  Service: Endoscopy;;  colon    Family History  Problem Relation Age of Onset   Arthritis Mother    Hypertension Mother    Stroke Mother        age 61   Cerebral aneurysm Father    Alcohol abuse Father    Cancer Brother    Diabetes Neg Hx    Heart disease Neg Hx     Social History   Socioeconomic History   Marital status: Single    Spouse name: Not on file   Number of children: 5   Years of education: 12   Highest education level: Associate degree: occupational, Scientist, product/process development, or vocational program  Occupational History   Occupation: Unemployed     Comment: car wash details  Tobacco Use   Smoking status: Former    Current packs/day: 0.00    Average packs/day: 1 pack/day for 38.0 years (38.0 ttl pk-yrs)    Types: Cigarettes    Start date: 02/20/1982    Quit date: 02/21/2020    Years since quitting: 3.9   Smokeless tobacco: Never  Vaping Use   Vaping status: Never Used  Substance and Sexual Activity   Alcohol use: Yes    Alcohol/week: 1.0 standard drink of alcohol    Types: 1 Cans of beer per week    Comment: Socially x 1/month currently (  as of 01/25/20); previously: occassionally: weekends, 6-pack or less on a day; or half pint of liquior   Drug use: Not Currently    Types: Cocaine, Marijuana    Comment: 01/25/20-marijuana few weeks ago, no cocaine   Sexual activity: Never  Other Topics Concern   Not on file  Social History Narrative    Lives alone.    5 daughters 29, 60 yo twins, eldest 2 are married live in [place]. Retired.Girlfriend works in Programmer, applications.   Social Drivers of Corporate investment banker Strain: Low Risk  (01/06/2024)   Overall Financial Resource Strain (CARDIA)    Difficulty of Paying Living Expenses: Not hard at all  Food Insecurity: No Food Insecurity (01/06/2024)   Hunger Vital Sign    Worried About Running Out of Food in the Last Year: Never true    Ran Out of Food in the Last Year: Never true   Transportation Needs: No Transportation Needs (01/06/2024)   PRAPARE - Administrator, Civil Service (Medical): No    Lack of Transportation (Non-Medical): No  Physical Activity: Sufficiently Active (01/06/2024)   Exercise Vital Sign    Days of Exercise per Week: 4 days    Minutes of Exercise per Session: 60 min  Stress: No Stress Concern Present (01/06/2024)   [employer] of Occupational Health - Occupational Stress Questionnaire    Feeling of Stress : Only a little  Social Connections: Moderately Integrated (01/06/2024)   Social Connection and Isolation Panel [NHANES]    Frequency of Communication with Friends and Family: More than three times a week    Frequency of Social Gatherings with Friends and Family: More than three times a week    Attends Religious Services: More than 4 times per year    Active Member of [employer] or Organizations: No    Attends Banker Meetings: Never    Marital Status: Married  Catering manager Violence: Not At Risk (01/06/2024)   Humiliation, Afraid, Rape, and Kick questionnaire    Fear of Current or Ex-Partner: No    Emotionally Abused: No    Physically Abused: No    Sexually Abused: No    Outpatient Medications Prior to Visit  Medication Sig Dispense Refill   albuterol (VENTOLIN HFA) 108 (90 Base) MCG/ACT inhaler INHALE 1-2 PUFFS BY MOUTH EVERY 6 HOURS AS NEEDED FOR WHEEZING OR FOR SHORTNESS OF BREATH 8.5 g 10   amLODipine (NORVASC) 5 MG tablet TAKE 1 TABLET BY MOUTH DAILY 30 tablet 10   FARXIGA 10 MG TABS tablet Take 10 mg by mouth daily.     fluticasone (FLONASE) 50 MCG/ACT nasal spray INSTILL TWO (2) SPRAYS IN EACH NOSTRIL TWICE DAILY 16 g 10   furosemide (LASIX) 20 MG tablet Take 20 mg by mouth daily.     OXYGEN Inhale 3 L into the lungs daily. continuous     OZEMPIC, 1 MG/DOSE, 4 MG/3ML SOPN SMARTSIG:0.75 Milliliter(s) SUB-Q Once a Week     roflumilast (DALIRESP) 500 MCG TABS tablet TAKE 1 TABLET ( ) BY MOUTH DAILY 30  tablet 11   sildenafil (VIAGRA) 50 MG tablet Take 1 tablet (50 mg total) by mouth daily as needed for erectile dysfunction. 30 tablet 1   spironolactone (ALDACTONE) 25 MG tablet Take 25 mg by mouth daily.     TRELEGY ELLIPTA 200-62.5-25 MCG/ACT AEPB INHALE ONE (1) PUFF BY MOUTH DAILY 60 each 10   guaiFENesin-codeine 100-10 MG/5ML syrup Take 5 mLs by mouth 3 (three) times  daily as needed for cough. 120 mL 0   oseltamivir (TAMIFLU) 75 MG capsule Take 1 capsule (75 mg total) by mouth 2 (two) times daily. 10 capsule 0   predniSONE (DELTASONE) 20 MG tablet Take 1 tablet (20 mg total) by mouth daily with breakfast. 5 tablet 0   No facility-administered medications prior to visit.    Allergies  Allergen Reactions   Apresoline [Hydralazine] Other (See Comments)    Headache     Review of Systems  Constitutional:  Positive for fatigue. Negative for chills and fever.  HENT:  Positive for congestion and sinus pressure. Negative for sore throat.   Eyes:  Negative for pain and discharge.  Respiratory:  Positive for cough and shortness of breath.   Cardiovascular:  Negative for chest pain and palpitations.  Gastrointestinal:  Negative for diarrhea, nausea and vomiting.  Endocrine: Negative for polydipsia and polyuria.  Genitourinary:  Negative for dysuria and hematuria.  Musculoskeletal:  Positive for back pain. Negative for neck pain and neck stiffness.  Skin:  Negative for rash.  Neurological:  Negative for dizziness, weakness, numbness and headaches.  Psychiatric/Behavioral:  Negative for agitation and behavioral problems.        Objective:    Physical Exam Vitals reviewed.  Constitutional:      General: He is not in acute distress.    Appearance: He is not diaphoretic.  HENT:     Head: Normocephalic and atraumatic.     Nose: Congestion present.     Mouth/Throat:     Mouth: Mucous membranes are moist.  Eyes:     General: No scleral icterus.    Extraocular Movements: Extraocular  movements intact.  Cardiovascular:     Rate and Rhythm: Regular rhythm. Tachycardia present.     Heart sounds: Normal heart sounds. No murmur heard. Pulmonary:     Breath sounds: Wheezing (Mild, diffuse) present. No rales.     Comments: On home O2 - 3 lpm Musculoskeletal:     Cervical back: Neck supple. No tenderness.     Lumbar back: Tenderness (right paraspinal) present. Decreased range of motion.     Right lower leg: No edema.     Left lower leg: No edema.  Skin:    General: Skin is warm.     Findings: No rash.  Neurological:     General: No focal deficit present.     Mental Status: He is alert and oriented to person, place, and time.  Psychiatric:        Mood and Affect: Mood normal.        Behavior: Behavior normal.     BP 111/65   Pulse 97   Ht 5\' 8"  (1.727 m)   Wt 232 lb (105.2 kg)   SpO2 90%   BMI 35.28 kg/m  Wt Readings from Last 3 Encounters:  01/20/24 232 lb (105.2 kg)  01/13/24 230 lb 12.8 oz (104.7 kg)  01/06/24 230 lb (104.3 kg)        Assessment & Plan:   Problem List Items Addressed This Visit       Respiratory   COPD exacerbation (HCC) - Primary   Recent worsening of cough and wheezing likely due to acute bronchitis/COPD in the setting of influenza infection Completed Tamiflu and oral prednisone 20 mg X 5 days Due to persistent cough with clear expectoration, started empiric azithromycin and Medrol Dosepak Continue Trelegy and as needed albuterol for dyspnea or wheezing Promethazine DM syrup as needed for cough Continue Flonase for  nasal congestion      Relevant Medications   azithromycin (ZITHROMAX) 250 MG tablet   methylPREDNISolone (MEDROL DOSEPAK) 4 MG TBPK tablet   promethazine-dextromethorphan (PROMETHAZINE-DM) 6.25-15 MG/5ML syrup     Meds ordered this encounter  Medications   azithromycin (ZITHROMAX) 250 MG tablet    Sig: Take 2 tablets on day 1, then 1 tablet daily on days 2 through 5    Dispense:  6 tablet    Refill:  0    methylPREDNISolone (MEDROL DOSEPAK) 4 MG TBPK tablet    Sig: Take as package instructions.    Dispense:  1 each    Refill:  0   promethazine-dextromethorphan (PROMETHAZINE-DM) 6.25-15 MG/5ML syrup    Sig: Take 5 mLs by mouth 4 (four) times daily as needed.    Dispense:  118 mL    Refill:  0     Prapti Grussing Concha Se, MD

## 2024-01-20 NOTE — Assessment & Plan Note (Addendum)
 Recent worsening of cough and wheezing likely due to acute bronchitis/COPD in the setting of influenza infection Completed Tamiflu and oral prednisone 20 mg X 5 days Due to persistent cough with clear expectoration, started empiric azithromycin and Medrol Dosepak Continue Trelegy and as needed albuterol for dyspnea or wheezing Promethazine DM syrup as needed for cough Continue Flonase for nasal congestion

## 2024-01-20 NOTE — Patient Instructions (Signed)
 Please start taking Azithromycin and Prednisone as prescribed.  Please take Promethazine-DM syrup as needed for cough.  Please continue to take other medications as prescribed.  Please continue to follow low carb diet and ambulate as tolerated.

## 2024-01-21 NOTE — Telephone Encounter (Signed)
 Once form is signed patient would like for them to be mailed back to him since he lives in [place]. The address on file is correct.

## 2024-01-21 NOTE — Telephone Encounter (Signed)
 Placard has been placed in Dr. Shirlee More folder for signature.

## 2024-01-27 ENCOUNTER — Telehealth: Payer: Self-pay | Admitting: Pulmonary Disease

## 2024-01-27 NOTE — Telephone Encounter (Signed)
 Please mail form but still call Pt to advise.

## 2024-01-27 NOTE — Telephone Encounter (Signed)
 Has Dr. Leonides Schanz this? PT calling asking again. Please call to advise. His # is [phone]

## 2024-01-27 NOTE — Telephone Encounter (Signed)
 Pending signature.   Pt is aware and voiced his understand.

## 2024-01-27 NOTE — Telephone Encounter (Signed)
 Please see 3/18 phone note.

## 2024-01-29 NOTE — Telephone Encounter (Signed)
 Form has been signed and placed in outgoing mail.  Pt is aware and voiced his understanding.  Nothing further needed.

## 2024-01-30 DIAGNOSIS — R7303 Prediabetes: Secondary | ICD-10-CM | POA: Diagnosis not present

## 2024-01-30 DIAGNOSIS — E559 Vitamin D deficiency, unspecified: Secondary | ICD-10-CM | POA: Diagnosis not present

## 2024-01-30 DIAGNOSIS — I10 Essential (primary) hypertension: Secondary | ICD-10-CM | POA: Diagnosis not present

## 2024-01-30 DIAGNOSIS — E782 Mixed hyperlipidemia: Secondary | ICD-10-CM | POA: Diagnosis not present

## 2024-01-31 LAB — CBC WITH DIFFERENTIAL/PLATELET
Basophils Absolute: 0.1 10*3/uL (ref 0.0–0.2)
Basos: 1 %
EOS (ABSOLUTE): 0.1 10*3/uL (ref 0.0–0.4)
Eos: 1 %
Hematocrit: 50.7 % (ref 37.5–51.0)
Hemoglobin: 16.6 g/dL (ref 13.0–17.7)
Immature Grans (Abs): 0 10*3/uL (ref 0.0–0.1)
Immature Granulocytes: 0 %
Lymphocytes Absolute: 2 10*3/uL (ref 0.7–3.1)
Lymphs: 22 %
MCH: 31.1 pg (ref 26.6–33.0)
MCHC: 32.7 g/dL (ref 31.5–35.7)
MCV: 95 fL (ref 79–97)
Monocytes Absolute: 0.9 10*3/uL (ref 0.1–0.9)
Monocytes: 10 %
Neutrophils Absolute: 5.9 10*3/uL (ref 1.4–7.0)
Neutrophils: 66 %
Platelets: 219 10*3/uL (ref 150–450)
RBC: 5.34 x10E6/uL (ref 4.14–5.80)
RDW: 13.8 % (ref 11.6–15.4)
WBC: 8.9 10*3/uL (ref 3.4–10.8)

## 2024-01-31 LAB — LIPID PANEL
Chol/HDL Ratio: 2.5 ratio (ref 0.0–5.0)
Cholesterol, Total: 162 mg/dL (ref 100–199)
HDL: 65 mg/dL (ref >39)
LDL Chol Calc (NIH): 85 mg/dL (ref 0–99)
Triglycerides: 60 mg/dL (ref 0–149)
VLDL Cholesterol Cal: 12 mg/dL (ref 5–40)

## 2024-01-31 LAB — CMP14+EGFR
ALT: 13 IU/L (ref 0–44)
AST: 19 IU/L (ref 0–40)
Albumin: 4.3 g/dL (ref 3.8–4.9)
Alkaline Phosphatase: 96 IU/L (ref 44–121)
BUN/Creatinine Ratio: 14 (ref 10–24)
BUN: 12 mg/dL (ref 8–27)
Bilirubin Total: 1.3 mg/dL — ABNORMAL HIGH (ref 0.0–1.2)
CO2: 25 mmol/L (ref 20–29)
Calcium: 9 mg/dL (ref 8.6–10.2)
Chloride: 97 mmol/L (ref 96–106)
Creatinine, Ser: 0.84 mg/dL (ref 0.76–1.27)
Globulin, Total: 2.9 g/dL (ref 1.5–4.5)
Glucose: 74 mg/dL (ref 70–99)
Potassium: 4.2 mmol/L (ref 3.5–5.2)
Sodium: 138 mmol/L (ref 134–144)
Total Protein: 7.2 g/dL (ref 6.0–8.5)
eGFR: 100 mL/min/{1.73_m2} (ref >59)

## 2024-01-31 LAB — HEMOGLOBIN A1C
Est. average glucose Bld gHb Est-mCnc: 114 mg/dL
Hgb A1c MFr Bld: 5.6 % (ref 4.8–5.6)

## 2024-01-31 LAB — PSA: Prostate Specific Ag, Serum: 0.5 ng/mL (ref 0.0–4.0)

## 2024-01-31 LAB — TSH: TSH: 0.971 u[IU]/mL (ref 0.450–4.500)

## 2024-01-31 LAB — VITAMIN D 25 HYDROXY (VIT D DEFICIENCY, FRACTURES): Vit D, 25-Hydroxy: 18.5 ng/mL — ABNORMAL LOW (ref 30.0–100.0)

## 2024-02-04 DIAGNOSIS — J449 Chronic obstructive pulmonary disease, unspecified: Secondary | ICD-10-CM | POA: Diagnosis not present

## 2024-02-11 ENCOUNTER — Ambulatory Visit (INDEPENDENT_AMBULATORY_CARE_PROVIDER_SITE_OTHER): Payer: 59 | Admitting: Internal Medicine

## 2024-02-11 ENCOUNTER — Encounter: Payer: Self-pay | Admitting: Internal Medicine

## 2024-02-11 VITALS — BP 125/72 | HR 69 | Ht 68.0 in | Wt 233.2 lb

## 2024-02-11 DIAGNOSIS — E559 Vitamin D deficiency, unspecified: Secondary | ICD-10-CM | POA: Insufficient documentation

## 2024-02-11 DIAGNOSIS — I272 Pulmonary hypertension, unspecified: Secondary | ICD-10-CM

## 2024-02-11 DIAGNOSIS — I10 Essential (primary) hypertension: Secondary | ICD-10-CM

## 2024-02-11 DIAGNOSIS — J441 Chronic obstructive pulmonary disease with (acute) exacerbation: Secondary | ICD-10-CM

## 2024-02-11 DIAGNOSIS — F3341 Major depressive disorder, recurrent, in partial remission: Secondary | ICD-10-CM

## 2024-02-11 DIAGNOSIS — E782 Mixed hyperlipidemia: Secondary | ICD-10-CM

## 2024-02-11 DIAGNOSIS — J9611 Chronic respiratory failure with hypoxia: Secondary | ICD-10-CM | POA: Diagnosis not present

## 2024-02-11 MED ORDER — PROMETHAZINE-DM 6.25-15 MG/5ML PO SYRP: 5.0000 mL | ORAL_SOLUTION | Freq: Four times a day (QID) | ORAL | 0 refills | Status: DC | PRN

## 2024-02-11 MED ORDER — AMOXICILLIN-POT CLAVULANATE 875-125 MG PO TABS: 1.0000 | ORAL_TABLET | Freq: Two times a day (BID) | ORAL | 0 refills | Status: DC

## 2024-02-11 NOTE — Assessment & Plan Note (Signed)
 Was doing well on room air till last month Uses continuous home O2 at 3-4 LPM due to hypoxia since last month Due to COPD exacerbation

## 2024-02-11 NOTE — Assessment & Plan Note (Signed)
 Last vitamin D Lab Results  Component Value Date   VD25OH 18.5 (L) 01/30/2024   Advised to take Vit D 5000 IU once daily

## 2024-02-11 NOTE — Progress Notes (Signed)
 Established Patient Office Visit  Subjective:  Patient ID: Ricardo Burch, male    DOB: 04-Mar-1961  Age: 63 y.o. MRN: [id number]  CC:  Chief Complaint  Patient presents with   Care Management    6 month f/u    HPI Ricardo Burch is a 63 y.o. male with past medical history of HTN, COPD, chronic hypoxic respiratory failure, OSA, GERD and chronic fatigue who presents for f/u of his chronic medical conditions.  HTN: BP is well-controlled. Takes medications regularly. Patient denies headache, dizziness, chest pain, or palpitations.   Chronic hypoxic respiratory failure and COPD: He uses Trelegy and as needed albuterol for COPD and follows up with Dr. Isaiah Serge.  He reports chronic nasal congestion and mild chest tightness with coughing, but worse for the last 2 days.  His wife works in medical facility, and has had similar symptoms as well. he had influenza and COPD exacerbation in the last month, which was treated with Tamiflu initially and later azithromycin and oral prednisone.  Denies any hemoptysis or recent worsening of dyspnea or wheezing.  Denies any fever or chills.  CHF and pulmonary HTN: His echo showed pulmonary HTN, for which he was referred to pulmonology HTN clinic at [facility]. He currently uses home O2 at 3 LPM PRN.  He has chronic dyspnea, but has improved lately with weight loss.  He was placed on Farxiga for HFpEF and Ozempic for weight loss benefit by cardiology clinic at [facility]. He has lost about 27 lbs since 05/23.  He reports that he feels fatigued and constipated with Ozempic.  His constipation has improved with Senokot S.  His back pain has improved lately. He has stopped taking Gabapentin.  He reports erectile dysfunction, and has used Viagra in the past.  Past Medical History:  Diagnosis Date   Allergy    CHF (congestive heart failure) (HCC)    Chronic kidney disease    COPD (chronic obstructive pulmonary disease) (HCC) Dx 2015   Depression    Eczema    since childhood     Emphysema of lung (HCC)    Erectile dysfunction 08/18/2019   GERD (gastroesophageal reflux disease) 01/25/2020   Hyperlipidemia 01/12/2018   Hypertension    Oxygen deficiency    Screening for HIV (human immunodeficiency virus)    Sleep apnea    CPAP   Substance abuse (HCC)    MJ, cocaine   Testicular failure 07/22/2019    Past Surgical History:  Procedure Laterality Date   COLONOSCOPY WITH PROPOFOL N/A 06/25/2017   Procedure: COLONOSCOPY WITH PROPOFOL;  Surgeon: Corbin Ade, MD;  Location: [facility];  Service: Endoscopy;  Laterality: N/A;  2:00pm   MULTIPLE EXTRACTIONS WITH ALVEOLOPLASTY N/A 03/22/2016   Procedure: MULTIPLE EXTRACTION WITH ALVEOLOPLASTY;  Surgeon: Ocie Doyne, DDS;  Location: [facility] OR;  Service: Oral Surgery;  Laterality: N/A;   NO PAST SURGERIES     POLYPECTOMY  06/25/2017   Procedure: POLYPECTOMY;  Surgeon: Corbin Ade, MD;  Location: [facility];  Service: Endoscopy;;  colon    Family History  Problem Relation Age of Onset   Arthritis Mother    Hypertension Mother    Stroke Mother        age 30   Cerebral aneurysm Father    Alcohol abuse Father    Cancer Brother    Diabetes Neg Hx    Heart disease Neg Hx     Social History   Socioeconomic History   Marital status: Single  Spouse name: Not on file   Number of children: 5   Years of education: 72   Highest education level: Associate degree: occupational, Scientist, product/process development, or vocational program  Occupational History   Occupation: Unemployed     Comment: car wash details  Tobacco Use   Smoking status: Former    Current packs/day: 0.00    Average packs/day: 1 pack/day for 38.0 years (38.0 ttl pk-yrs)    Types: Cigarettes    Start date: 02/20/1982    Quit date: 02/21/2020    Years since quitting: 3.9   Smokeless tobacco: Never  Vaping Use   Vaping status: Never Used  Substance and Sexual Activity   Alcohol use: Yes    Alcohol/week: 1.0 standard drink of alcohol    Types: 1 Cans of  beer per week    Comment: Socially x 1/month currently (as of 01/25/20); previously: occassionally: weekends, 6-pack or less on a day; or half pint of liquior   Drug use: Not Currently    Types: Cocaine, Marijuana    Comment: 01/25/20-marijuana few weeks ago, no cocaine   Sexual activity: Never  Other Topics Concern   Not on file  Social History Narrative    Lives alone.    5 daughters 70, 16 yo twins, eldest 2 are married live in [place]. Retired.Girlfriend works in Programmer, applications.   Social Drivers of Corporate investment banker Strain: Low Risk  (01/06/2024)   Overall Financial Resource Strain (CARDIA)    Difficulty of Paying Living Expenses: Not hard at all  Food Insecurity: No Food Insecurity (01/06/2024)   Hunger Vital Sign    Worried About Running Out of Food in the Last Year: Never true    Ran Out of Food in the Last Year: Never true  Transportation Needs: No Transportation Needs (01/06/2024)   PRAPARE - Administrator, Civil Service (Medical): No    Lack of Transportation (Non-Medical): No  Physical Activity: Sufficiently Active (01/06/2024)   Exercise Vital Sign    Days of Exercise per Week: 4 days    Minutes of Exercise per Session: 60 min  Stress: No Stress Concern Present (01/06/2024)   [employer] of Occupational Health - Occupational Stress Questionnaire    Feeling of Stress : Only a little  Social Connections: Moderately Integrated (01/06/2024)   Social Connection and Isolation Panel [NHANES]    Frequency of Communication with Friends and Family: More than three times a week    Frequency of Social Gatherings with Friends and Family: More than three times a week    Attends Religious Services: More than 4 times per year    Active Member of [employer] or Organizations: No    Attends Banker Meetings: Never    Marital Status: Married  Catering manager Violence: Not At Risk (01/06/2024)   Humiliation, Afraid, Rape, and Kick questionnaire    Fear of  Current or Ex-Partner: No    Emotionally Abused: No    Physically Abused: No    Sexually Abused: No    Outpatient Medications Prior to Visit  Medication Sig Dispense Refill   albuterol (VENTOLIN HFA) 108 (90 Base) MCG/ACT inhaler INHALE 1-2 PUFFS BY MOUTH EVERY 6 HOURS AS NEEDED FOR WHEEZING OR FOR SHORTNESS OF BREATH 8.5 g 10   amLODipine (NORVASC) 5 MG tablet TAKE 1 TABLET BY MOUTH DAILY 30 tablet 10   FARXIGA 10 MG TABS tablet Take 10 mg by mouth daily.     fluticasone (FLONASE) 50 MCG/ACT  nasal spray INSTILL TWO (2) SPRAYS IN EACH NOSTRIL TWICE DAILY 16 g 10   furosemide (LASIX) 20 MG tablet Take 20 mg by mouth daily.     OXYGEN Inhale 3 L into the lungs daily. continuous     OZEMPIC, 1 MG/DOSE, 4 MG/3ML SOPN SMARTSIG:0.75 Milliliter(s) SUB-Q Once a Week     roflumilast (DALIRESP) 500 MCG TABS tablet TAKE 1 TABLET ( ) BY MOUTH DAILY 30 tablet 11   sildenafil (VIAGRA) 50 MG tablet Take 1 tablet (50 mg total) by mouth daily as needed for erectile dysfunction. 30 tablet 1   spironolactone (ALDACTONE) 25 MG tablet Take 25 mg by mouth daily.     TRELEGY ELLIPTA 200-62.5-25 MCG/ACT AEPB INHALE ONE (1) PUFF BY MOUTH DAILY 60 each 10   methylPREDNISolone (MEDROL DOSEPAK) 4 MG TBPK tablet Take as package instructions. 1 each 0   promethazine-dextromethorphan (PROMETHAZINE-DM) 6.25-15 MG/5ML syrup Take 5 mLs by mouth 4 (four) times daily as needed. 118 mL 0   No facility-administered medications prior to visit.    Allergies  Allergen Reactions   Apresoline [Hydralazine] Other (See Comments)    Headache     ROS Review of Systems  Constitutional:  Positive for fatigue. Negative for chills and fever.  HENT:  Positive for congestion. Negative for sore throat.   Eyes:  Negative for pain and discharge.  Respiratory:  Positive for cough and shortness of breath.   Cardiovascular:  Negative for chest pain and palpitations.  Gastrointestinal:  Negative for diarrhea, nausea and vomiting.   Endocrine: Negative for polydipsia and polyuria.  Genitourinary:  Negative for dysuria and hematuria.  Musculoskeletal:  Positive for back pain. Negative for neck pain and neck stiffness.  Skin:  Negative for rash.  Neurological:  Negative for dizziness, weakness, numbness and headaches.  Psychiatric/Behavioral:  Negative for agitation and behavioral problems.       Objective:    Physical Exam Vitals reviewed.  Constitutional:      General: He is not in acute distress.    Appearance: He is not diaphoretic.  HENT:     Head: Normocephalic and atraumatic.     Nose: Congestion present.     Mouth/Throat:     Mouth: Mucous membranes are moist.  Eyes:     General: No scleral icterus.    Extraocular Movements: Extraocular movements intact.  Cardiovascular:     Rate and Rhythm: Normal rate and regular rhythm.     Heart sounds: Normal heart sounds. No murmur heard. Pulmonary:     Breath sounds: No wheezing or rales.     Comments: On home O2 - 3 lpm Musculoskeletal:     Cervical back: Neck supple. No tenderness.     Lumbar back: Tenderness (right paraspinal) present. Decreased range of motion.     Right lower leg: No edema.     Left lower leg: No edema.  Skin:    General: Skin is warm.     Findings: No rash.  Neurological:     General: No focal deficit present.     Mental Status: He is alert and oriented to person, place, and time.     Sensory: No sensory deficit.     Motor: No weakness.  Psychiatric:        Mood and Affect: Mood normal.        Behavior: Behavior normal.     BP 125/72   Pulse 69   Ht 5\' 8"  (1.727 m) Comment: set to 3  Wt 233 lb  3.2 oz (105.8 kg)   SpO2 92%   BMI 35.46 kg/m  Wt Readings from Last 3 Encounters:  02/11/24 233 lb 3.2 oz (105.8 kg)  01/20/24 232 lb (105.2 kg)  01/13/24 230 lb 12.8 oz (104.7 kg)    Lab Results  Component Value Date   TSH 0.971 01/30/2024   Lab Results  Component Value Date   WBC 8.9 01/30/2024   HGB 16.6  01/30/2024   HCT 50.7 01/30/2024   MCV 95 01/30/2024   PLT 219 01/30/2024   Lab Results  Component Value Date   NA 138 01/30/2024   K 4.2 01/30/2024   CO2 25 01/30/2024   GLUCOSE 74 01/30/2024   BUN 12 01/30/2024   CREATININE 0.84 01/30/2024   BILITOT 1.3 (H) 01/30/2024   ALKPHOS 96 01/30/2024   AST 19 01/30/2024   ALT 13 01/30/2024   PROT 7.2 01/30/2024   ALBUMIN 4.3 01/30/2024   CALCIUM 9.0 01/30/2024   ANIONGAP 10 09/25/2023   EGFR 100 01/30/2024   Lab Results  Component Value Date   CHOL 162 01/30/2024   Lab Results  Component Value Date   HDL 65 01/30/2024   Lab Results  Component Value Date   LDLCALC 85 01/30/2024   Lab Results  Component Value Date   TRIG 60 01/30/2024   Lab Results  Component Value Date   CHOLHDL 2.5 01/30/2024   Lab Results  Component Value Date   HGBA1C 5.6 01/30/2024      Assessment & Plan:   Problem List Items Addressed This Visit       Cardiovascular and Mediastinum   Hypertension   BP Readings from Last 1 Encounters:  02/11/24 125/72   Well-controlled with amlodipine 5 mg QD Counseled for compliance with the medications Advised DASH diet and moderate exercise/walking as tolerated      Pulmonary hypertension, moderate to severe (HCC)   Noted on echo Likely due to COPD Followed by pulmonology and pulmonary HTN clinic at [facility] euvolemic        Respiratory   COPD exacerbation (HCC)   Recent worsening of cough and wheezing likely due to acute bronchitis/COPD, had influenza infection about a month ago Current worsening could be due to pollen allergies as well If recurrent cough with clear expectoration after 2 days, advised to start empiric Augmentin Continue Trelegy and as needed albuterol for dyspnea or wheezing Promethazine DM syrup as needed for cough Continue Flonase for nasal congestion      Relevant Medications   promethazine-dextromethorphan (PROMETHAZINE-DM) 6.25-15 MG/5ML syrup    amoxicillin-clavulanate (AUGMENTIN) 875-125 MG tablet   Chronic respiratory failure with hypoxia (HCC) - Primary   Was doing well on room air till last month Uses continuous home O2 at 3-4 LPM due to hypoxia since last month Due to COPD exacerbation        Other   MDD (major depressive disorder), recurrent, in partial remission (HCC)   Flowsheet Row Office Visit from 02/11/2024 in [facility]  PHQ-9 Total Score 8      Likely due to chronic medical conditions related fatigue Overall better controlled DC Zoloft as he has stopped it anyways Advised to start Vitamin D and B12 supplements      Hyperlipidemia   Advised to follow low-cholesterol diet Did not tolerate statin      Vitamin D deficiency   Last vitamin D Lab Results  Component Value Date   VD25OH 18.5 (L) 01/30/2024   Advised to take  Vit D 5000 IU once daily        Meds ordered this encounter  Medications   promethazine-dextromethorphan (PROMETHAZINE-DM) 6.25-15 MG/5ML syrup    Sig: Take 5 mLs by mouth 4 (four) times daily as needed.    Dispense:  118 mL    Refill:  0   amoxicillin-clavulanate (AUGMENTIN) 875-125 MG tablet    Sig: Take 1 tablet by mouth 2 (two) times daily.    Dispense:  14 tablet    Refill:  0    Follow-up: Return in about 4 months (around 06/12/2024) for Annual physical.    Anabel Halon, MD

## 2024-02-11 NOTE — Assessment & Plan Note (Signed)
 Flowsheet Row Office Visit from 02/11/2024 in [facility]  PHQ-9 Total Score 8      Likely due to chronic medical conditions related fatigue Overall better controlled DC Zoloft as he has stopped it anyways Advised to start Vitamin D and B12 supplements

## 2024-02-11 NOTE — Assessment & Plan Note (Signed)
 Recent worsening of cough and wheezing likely due to acute bronchitis/COPD, had influenza infection about a month ago Current worsening could be due to pollen allergies as well If recurrent cough with clear expectoration after 2 days, advised to start empiric Augmentin Continue Trelegy and as needed albuterol for dyspnea or wheezing Promethazine DM syrup as needed for cough Continue Flonase for nasal congestion

## 2024-02-11 NOTE — Patient Instructions (Addendum)
 Please start taking Vitamin D 5000 IU once daily and Vitamin B12 500 mcg once daily.  Please take Promethazine DM syrup as needed for cough.  If symptoms do not improve in the next 2 days, please start taking Augmentin as prescribed.  Please continue to take medications as prescribed.  Please continue to follow low carb diet and perform moderate exercise/walking as tolerated.

## 2024-02-11 NOTE — Assessment & Plan Note (Signed)
 BP Readings from Last 1 Encounters:  02/11/24 125/72   Well-controlled with amlodipine 5 mg QD Counseled for compliance with the medications Advised DASH diet and moderate exercise/walking as tolerated

## 2024-02-11 NOTE — Assessment & Plan Note (Signed)
Noted on echo Likely due to COPD Followed by pulmonology and pulmonary HTN clinic at [facility] euvolemic

## 2024-02-11 NOTE — Assessment & Plan Note (Signed)
Advised to follow low-cholesterol diet Did not tolerate statin

## 2024-02-18 ENCOUNTER — Other Ambulatory Visit: Payer: Self-pay | Admitting: Internal Medicine

## 2024-02-18 DIAGNOSIS — I10 Essential (primary) hypertension: Secondary | ICD-10-CM

## 2024-02-19 ENCOUNTER — Telehealth: Payer: Self-pay | Admitting: *Deleted

## 2024-02-19 NOTE — Telephone Encounter (Signed)
  Procedure: COLONOSCOPY  Estimated body mass index is 35.46 kg/m as calculated from the following:   Height as of 02/11/24: 5\' 8"  (1.727 m).   Weight as of 02/11/24: 233 lb 3.2 oz (105.8 kg).   Have you had a colonoscopy before?  06/2017, Dr. Riley Cheadle  Do you have family history of colon cancer?  no  Do you have a family history of polyps? no  Previous colonoscopy with polyps removed? yes  Do you have a history colorectal cancer?   no  Are you diabetic?    Do you have a prosthetic or mechanical heart valve? no  Do you have a pacemaker/defibrillator?   no  Have you had endocarditis/atrial fibrillation?  no  Do you use supplemental oxygen/CPAP?  yes  Have you had joint replacement within the last 12 months?  no  Do you tend to be constipated or have to use laxatives?  no   Do you have history of alcohol use? If yes, how much and how often.  yes  Do you have history or are you using drugs? If yes, what do are you  using?  Yes marijuana  Have you ever had a stroke/heart attack?  no  Have you ever had a heart or other vascular stent placed,?no  Do you take weight loss medication? Yes ozempic   Do you take any blood-thinning medications such as: (Plavix, aspirin, Coumadin, Aggrenox, Brilinta, Xarelto, Eliquis, Pradaxa, Savaysa or Effient)? no  If yes we need the name, milligram, dosage and who is prescribing doctor:               Current Outpatient Medications on File Prior to Visit  Medication Sig Dispense Refill   albuterol (VENTOLIN HFA) 108 (90 Base) MCG/ACT inhaler INHALE 1-2 PUFFS BY MOUTH EVERY 6 HOURS AS NEEDED FOR WHEEZING OR FOR SHORTNESS OF BREATH 8.5 g 10   amLODipine (NORVASC) 5 MG tablet TAKE 1 TABLET BY MOUTH DAILY 30 tablet 10   fluticasone (FLONASE) 50 MCG/ACT nasal spray INSTILL TWO (2) SPRAYS IN EACH NOSTRIL TWICE DAILY 16 g 10   furosemide (LASIX) 20 MG tablet Take 20 mg by mouth daily.     OXYGEN Inhale 3 L into the lungs daily. continuous     OZEMPIC, 1  MG/DOSE, 4 MG/3ML SOPN SMARTSIG:0.75 Milliliter(s) SUB-Q Once a Week     promethazine-dextromethorphan (PROMETHAZINE-DM) 6.25-15 MG/5ML syrup Take 5 mLs by mouth 4 (four) times daily as needed. 118 mL 0   roflumilast (DALIRESP) 500 MCG TABS tablet TAKE 1 TABLET ( ) BY MOUTH DAILY 30 tablet 11   sildenafil (VIAGRA) 50 MG tablet Take 1 tablet (50 mg total) by mouth daily as needed for erectile dysfunction. 30 tablet 1   spironolactone (ALDACTONE) 25 MG tablet Take 25 mg by mouth daily.     TRELEGY ELLIPTA 200-62.5-25 MCG/ACT AEPB INHALE ONE (1) PUFF BY MOUTH DAILY 60 each 10   No current facility-administered medications on file prior to visit.      Allergies  Allergen Reactions   Apresoline [Hydralazine] Other (See Comments)    Headache

## 2024-02-29 DIAGNOSIS — G4733 Obstructive sleep apnea (adult) (pediatric): Secondary | ICD-10-CM | POA: Diagnosis not present

## 2024-02-29 DIAGNOSIS — J449 Chronic obstructive pulmonary disease, unspecified: Secondary | ICD-10-CM | POA: Diagnosis not present

## 2024-03-01 DIAGNOSIS — J449 Chronic obstructive pulmonary disease, unspecified: Secondary | ICD-10-CM | POA: Diagnosis not present

## 2024-03-01 DIAGNOSIS — G4733 Obstructive sleep apnea (adult) (pediatric): Secondary | ICD-10-CM | POA: Diagnosis not present

## 2024-03-05 DIAGNOSIS — J449 Chronic obstructive pulmonary disease, unspecified: Secondary | ICD-10-CM | POA: Diagnosis not present

## 2024-03-10 NOTE — Telephone Encounter (Signed)
 Colonoscopy 2018: two 4-6 mm polyps, diverticulosis (tubular adenomas).   Hx of COPD, pulmonary HTN, seen at [facility]. Home oxygen . I recommend visit prior to colonoscopy as we may need to consider pulmonary clearance prior.

## 2024-03-17 DIAGNOSIS — J9611 Chronic respiratory failure with hypoxia: Secondary | ICD-10-CM | POA: Diagnosis not present

## 2024-03-17 DIAGNOSIS — J432 Centrilobular emphysema: Secondary | ICD-10-CM | POA: Diagnosis not present

## 2024-03-25 ENCOUNTER — Encounter: Payer: Self-pay | Admitting: Gastroenterology

## 2024-03-25 ENCOUNTER — Other Ambulatory Visit: Payer: Self-pay | Admitting: *Deleted

## 2024-03-25 ENCOUNTER — Encounter: Payer: Self-pay | Admitting: *Deleted

## 2024-03-25 ENCOUNTER — Ambulatory Visit (INDEPENDENT_AMBULATORY_CARE_PROVIDER_SITE_OTHER): Admitting: Gastroenterology

## 2024-03-25 VITALS — BP 137/80 | HR 80 | Temp 98.2°F | Ht 68.0 in | Wt 241.8 lb

## 2024-03-25 DIAGNOSIS — Z860101 Personal history of adenomatous and serrated colon polyps: Secondary | ICD-10-CM

## 2024-03-25 DIAGNOSIS — K5900 Constipation, unspecified: Secondary | ICD-10-CM | POA: Diagnosis not present

## 2024-03-25 MED ORDER — PEG 3350-KCL-NA BICARB-NACL 420 G PO SOLR: 4000.0000 mL | Freq: Once | ORAL | 0 refills | Status: AC

## 2024-03-25 NOTE — Patient Instructions (Signed)
 Will get you scheduled for a colonoscopy in the near future with Dr. Riley Cheadle. You will need to hold Ozempic for 1 week prior to your procedure.  If you have recurrent constipation, you can take over-the-counter softener (docusate sodium ) up to 300 mg/day or MiraLAX  17 g (1 capful) daily in 8 ounces of water.   I will see you back as needed.   It was nice to meet you today!  Shana Daring, PA-C [facility]

## 2024-03-25 NOTE — Progress Notes (Signed)
 Referring Provider:Patel, Artie Bimler, MD Primary Care Physician:  Meldon Sport, MD Primary Gastroenterologist:  Dr. Riley Cheadle  Chief Complaint  Patient presents with   Colonoscopy    Colonoscopy screening     HPI:   Alias Ricardo Burch is a 63 y.o. male presenting today to discuss scheduling surveillance colonoscopy.   Last colonoscopy in August 2018 with diverticulosis, two 4 to 6 mm polyps resected and retrieved.  Pathology with tubular adenoma x 1 and benign colonic mucosa.  Recommended 5-year surveillance.  Today:  Sometimes with constipation. Here lately having normal bowel movements every morning. Will take  OTC stool softener as needed. Was on ozempic which seemed to be the cause of constipation. Stopped it for a while and constipation resolved. Just started Ozempic back last week.    Denies BRBPR, melena, unintentional weight loss, nausea, vomiting, heartburn, dysphagia.  COPD: Uses oxygen  when up doing something. Wearing 3L. Also sleeps with oxygen  and CPAP.      Past Medical History:  Diagnosis Date   Allergy    CHF (congestive heart failure) (HCC)    HFpEF   Chronic kidney disease    COPD (chronic obstructive pulmonary disease) (HCC) Dx 2015   Depression    Eczema    since childhood    Emphysema of lung (HCC)    Erectile dysfunction 08/18/2019   GERD (gastroesophageal reflux disease) 01/25/2020   Hyperlipidemia 01/12/2018   Hypertension    Oxygen  deficiency    Screening for HIV (human immunodeficiency virus)    Sleep apnea    CPAP   Substance abuse (HCC)    MJ, cocaine   Testicular failure 07/22/2019    Past Surgical History:  Procedure Laterality Date   COLONOSCOPY WITH PROPOFOL  N/A 06/25/2017   Surgeon: Suzette Espy, MD;  diverticulosis, two 4 to 6 mm polyps resected and retrieved.  Pathology with tubular adenoma x 1 and benign colonic mucosa.  Recommended 5-year surveillance.   MULTIPLE EXTRACTIONS WITH ALVEOLOPLASTY N/A 03/22/2016   Procedure: MULTIPLE  EXTRACTION WITH ALVEOLOPLASTY;  Surgeon: Ascencion Lava, DDS;  Location: [facility] OR;  Service: Oral Surgery;  Laterality: N/A;   NO PAST SURGERIES     POLYPECTOMY  06/25/2017   Procedure: POLYPECTOMY;  Surgeon: Suzette Espy, MD;  Location: [facility];  Service: Endoscopy;;  colon    Current Outpatient Medications  Medication Sig Dispense Refill   albuterol  (VENTOLIN  HFA) 108 (90 Base) MCG/ACT inhaler INHALE 1-2 PUFFS BY MOUTH EVERY 6 HOURS AS NEEDED FOR WHEEZING OR FOR SHORTNESS OF BREATH 8.5 g 10   amLODipine  (NORVASC ) 5 MG tablet TAKE 1 TABLET BY MOUTH DAILY 30 tablet 11   fluticasone  (FLONASE ) 50 MCG/ACT nasal spray INSTILL TWO (2) SPRAYS IN EACH NOSTRIL TWICE DAILY 16 g 10   furosemide  (LASIX ) 20 MG tablet Take 20 mg by mouth daily.     OXYGEN  Inhale 3 L into the lungs daily. continuous     OZEMPIC, 1 MG/DOSE, 4 MG/3ML SOPN SMARTSIG:0.75 Milliliter(s) SUB-Q Once a Week     roflumilast  (DALIRESP ) 500 MCG TABS tablet TAKE 1 TABLET ( ) BY MOUTH DAILY 30 tablet 11   sildenafil  (VIAGRA ) 50 MG tablet Take 1 tablet (50 mg total) by mouth daily as needed for erectile dysfunction. 30 tablet 1   spironolactone (ALDACTONE) 25 MG tablet Take 25 mg by mouth daily.     TRELEGY ELLIPTA  200-62.5-25 MCG/ACT AEPB INHALE ONE (1) PUFF BY MOUTH DAILY 60 each 10   promethazine -dextromethorphan (PROMETHAZINE -DM) 6.25-15 MG/5ML syrup Take  5 mLs by mouth 4 (four) times daily as needed. (Patient not taking: Reported on 03/25/2024) 118 mL 0   No current facility-administered medications for this visit.    Allergies as of 03/25/2024 - Review Complete 03/25/2024  Allergen Reaction Noted   Apresoline  [hydralazine ] Other (See Comments) 07/10/2016    Family History  Problem Relation Age of Onset   Arthritis Mother    Hypertension Mother    Stroke Mother        age 60   Cerebral aneurysm Father    Alcohol abuse Father    Cancer Brother    Diabetes Neg Hx    Heart disease Neg Hx    Colon cancer Neg Hx      Social History   Socioeconomic History   Marital status: Single    Spouse name: Not on file   Number of children: 5   Years of education: 12   Highest education level: Associate degree: occupational, Scientist, product/process development, or vocational program  Occupational History   Occupation: Unemployed     Comment: car wash details  Tobacco Use   Smoking status: Former    Current packs/day: 0.00    Average packs/day: 1 pack/day for 38.0 years (38.0 ttl pk-yrs)    Types: Cigarettes    Start date: 02/20/1982    Quit date: 02/21/2020    Years since quitting: 4.0   Smokeless tobacco: Never  Vaping Use   Vaping status: Never Used  Substance and Sexual Activity   Alcohol use: Yes    Alcohol/week: 1.0 standard drink of alcohol    Types: 1 Cans of beer per week    Comment: Socially x 1/month currently (as of 01/25/20); previously: occassionally: weekends, 6-pack or less on a day; or half pint of liquior   Drug use: Not Currently    Types: Cocaine, Marijuana    Comment: 01/25/20-marijuana few weeks ago, no cocaine (4-6 yeats ago)   Sexual activity: Never  Other Topics Concern   Not on file  Social History Narrative    Lives alone.    5 daughters 79, 43 yo twins, eldest 2 are married live in [place]. Retired.Girlfriend works in Programmer, applications.   Social Drivers of Corporate investment banker Strain: Low Risk  (01/06/2024)   Overall Financial Resource Strain (CARDIA)    Difficulty of Paying Living Expenses: Not hard at all  Food Insecurity: No Food Insecurity (01/06/2024)   Hunger Vital Sign    Worried About Running Out of Food in the Last Year: Never true    Ran Out of Food in the Last Year: Never true  Transportation Needs: No Transportation Needs (01/06/2024)   PRAPARE - Administrator, Civil Service (Medical): No    Lack of Transportation (Non-Medical): No  Physical Activity: Sufficiently Active (01/06/2024)   Exercise Vital Sign    Days of Exercise per Week: 4 days    Minutes of Exercise  per Session: 60 min  Stress: No Stress Concern Present (01/06/2024)   [employer] of Occupational Health - Occupational Stress Questionnaire    Feeling of Stress : Only a little  Social Connections: Moderately Integrated (01/06/2024)   Social Connection and Isolation Panel [NHANES]    Frequency of Communication with Friends and Family: More than three times a week    Frequency of Social Gatherings with Friends and Family: More than three times a week    Attends Religious Services: More than 4 times per year    Active Member of  Clubs or Organizations: No    Attends Banker Meetings: Never    Marital Status: Married  Catering manager Violence: Not At Risk (01/06/2024)   Humiliation, Afraid, Rape, and Kick questionnaire    Fear of Current or Ex-Partner: No    Emotionally Abused: No    Physically Abused: No    Sexually Abused: No    Review of Systems: Gen: Denies any fever, chills, cold or flulike symptoms, presyncope, syncope. CV: Denies chest pain.  Admits to occasional heart palpitation. Resp: Admits to chronic shortness of breath.  No cough. GI: See HPI GU : Denies urinary burning, urinary frequency, urinary hesitancy MS: Denies joint pain. Derm: Denies rash. Psych: Denies depression, anxiety. Heme: See HPI  Physical Exam: BP 137/80 (BP Location: Right Arm, Patient Position: Sitting, Cuff Size: Large)   Pulse 80   Temp 98.2 F (36.8 C) (Temporal)   Ht 5\' 8"  (1.727 m)   Wt 241 lb 12.8 oz (109.7 kg)   BMI 36.77 kg/m  General:   Alert and oriented. Pleasant and cooperative. Well-nourished and well-developed.  Head:  Normocephalic and atraumatic. Eyes:  Without icterus, sclera clear and conjunctiva pink.  Ears:  Normal auditory acuity. Lungs:  Clear to auscultation bilaterally. No wheezes, rales, or rhonchi. No distress.  Heart:  S1, S2 present without murmurs appreciated.  Abdomen:  +BS, soft, non-tender and non-distended. No HSM noted. No guarding or rebound.  No masses appreciated.  Rectal:  Deferred  Msk:  Symmetrical without gross deformities. Normal posture. Extremities:  Without edema. Neurologic:  Alert and  oriented x4;  grossly normal neurologically. Skin:  Intact without significant lesions or rashes. Psych:  Normal mood and affect.    Assessment:  63 year old male with history of HFpEF, CKD, COPD on 3 L O2, HTN, HLD, adenomatous colon polyp, presenting today to discuss scheduling surveillance colonoscopy.  He is currently overdue.  Last colonoscopy was in 2018.  He had 1 tubular adenoma removed with recommendations for 5-year surveillance.  Clinically, he is doing well at this time with no significant GI symptoms or alarm symptoms.  Notably, he does report having some constipation while previously taking Ozempic.  He held it for a while and symptoms resolved, but started Ozempic back 1 week ago.  Bowels are still moving well at this time.   Plan:  Proceed with colonoscopy with propofol  by Dr. Riley Cheadle in near future. The risks, benefits, and alternatives have been discussed with the patient in detail. The patient states understanding and desires to proceed.  ASA 3 Hold Ozempic x 1 week prior UDS prior MiraLAX  17 g daily x 7 days prior If recurrent constipation, start MiraLAX  17 g daily or docusate sodium  up to 300 mg/day. Follow-up as needed   Lavanda Porter [facility]astroenter[facility] 03/25/2024

## 2024-04-04 DIAGNOSIS — J449 Chronic obstructive pulmonary disease, unspecified: Secondary | ICD-10-CM | POA: Diagnosis not present

## 2024-04-04 DIAGNOSIS — G4733 Obstructive sleep apnea (adult) (pediatric): Secondary | ICD-10-CM | POA: Diagnosis not present

## 2024-04-05 DIAGNOSIS — J449 Chronic obstructive pulmonary disease, unspecified: Secondary | ICD-10-CM | POA: Diagnosis not present

## 2024-04-15 ENCOUNTER — Telehealth: Payer: Self-pay | Admitting: Internal Medicine

## 2024-04-15 NOTE — Telephone Encounter (Signed)
Pt informed

## 2024-04-15 NOTE — Telephone Encounter (Signed)
 Copied from CRM [id number]. Topic: Clinical - Medical Advice >> Apr 15, 2024  9:05 AM Crispin Dolphin wrote: Reason for CRM: Patient called states he is thinking about taking total beets powder. Would like to know if its ok to take along with his medication. Thank You

## 2024-04-19 ENCOUNTER — Other Ambulatory Visit: Payer: Self-pay | Admitting: Pulmonary Disease

## 2024-04-20 NOTE — Telephone Encounter (Signed)
Dr. Mannam, please advise. Thanks!  

## 2024-04-28 ENCOUNTER — Encounter: Payer: Self-pay | Admitting: Pulmonary Disease

## 2024-04-28 ENCOUNTER — Ambulatory Visit (INDEPENDENT_AMBULATORY_CARE_PROVIDER_SITE_OTHER): Admitting: Pulmonary Disease

## 2024-04-28 VITALS — BP 110/84 | HR 87 | Ht 68.0 in | Wt 238.8 lb

## 2024-04-28 DIAGNOSIS — J432 Centrilobular emphysema: Secondary | ICD-10-CM | POA: Diagnosis not present

## 2024-04-28 DIAGNOSIS — J4489 Other specified chronic obstructive pulmonary disease: Secondary | ICD-10-CM | POA: Diagnosis not present

## 2024-04-28 DIAGNOSIS — J439 Emphysema, unspecified: Secondary | ICD-10-CM

## 2024-04-28 DIAGNOSIS — Z87891 Personal history of nicotine dependence: Secondary | ICD-10-CM

## 2024-04-28 NOTE — Progress Notes (Signed)
 Ricardo Burch    [id number]    Apr 27, 1961  Primary Care Physician:Patel, Suzzane POUR, MD  Referring Physician: Tobie Suzzane POUR, MD [address],  [place] [address]  Chief complaint:   Follow up for Severe COPD Severe OSA on autoset  HPI: Ricardo Burch is a 63 y.o. exsmoker with COPD. He's had multiple hospitalizations with COPD, CHF exacerbations in the past.He was hospitalized from 07/26/16-07/30/16 with acute pulmonary edema, acute exacerbation of COPD. He was intubated briefly for hypercarbia.   He has finished the pulmonary rehab and reports improved dyspnea. He has been on a rotating list of inhalers mainly due to his inability to pay co-pay. SABRA He has been started on CPAP after a repeat titration study and is tolerating it well. Stopped smoking in March 2021  Referred to [facility] in 2022 for consideration of endobronchial lung volume reduction procedure.  He was evaluated there and referred to pulmonary hypertension clinic as Dr. Melvinia wanted that evaluated before doing the procedure.  Right and left heart cath at [facility] in 2023 showed pre and postcapillary pulmonary hypertension due to group 3 disease and is being followed by cardiology.  Interim History: Discussed the use of AI scribe software for clinical note transcription with the patient, who gave verbal consent to proceed.  History of Present Illness Ricardo Burch is a 63 year old male with severe sleep apnea and pulmonary hypertension who presents for evaluation of CPAP use and weight management.  He has been using a CPAP machine set at a pressure of 12 since 2016 following a sleep study that diagnosed him with severe sleep apnea, with an apnea-hypopnea index (AHI) of 151. Despite significant weight loss from 262 pounds to 230 pounds, he continues to use the CPAP every night, along with supplemental oxygen . He wakes up feeling like his lungs are full, but this sensation improves once he starts moving around. He hopes that  he might not need the CPAP in the future.  He is currently on Ozempic, primarily for weight loss, and has successfully reduced his weight from 262 pounds to 230 pounds. He mentions a plateau in his weight loss at 232 pounds. His BMI remains high at 36, and he aims to reduce it further.  He has a history of congestive heart failure and was diagnosed with pre and post capillary pulmonary hypertension in 2023 following a right heart catheterization and an invasive cardiopulmonary exercise test. He recalls a procedure where a catheter was inserted through his neck to assess his heart pressures. He mentions a potential study involving a procedure to address high pressures in his heart, but expresses uncertainty about its necessity and outcomes.  He has severe COPD and is currently managed on Trelegy and Daliresp . He reports stable symptoms and does not experience shortness of breath during daily activities, such as taking his dog out or washing his car. He notes that his phlegm, which was previously yellow, is now clear.  He inquires about Dupixent for COPD but is informed that his eosinophil count is too low to benefit from it. He has previously participated in pulmonary rehabilitation but notes that his insurance does not cover additional sessions. He is open to resuming pulmonary rehab if possible.  He takes B12, D3, and Total Beets supplements to support his health. He inquires about NAC, a supplement suggested by an acquaintance, but has not started it due to lack of evidence for its efficacy.    Outpatient  Encounter Medications as of 04/28/2024  Medication Sig   albuterol  (VENTOLIN  HFA) 108 (90 Base) MCG/ACT inhaler INHALE 1-2 PUFFS BY MOUTH EVERY 6 HOURS AS NEEDED FOR WHEEZING AND SHORTNESS OF BREATH   amLODipine  (NORVASC ) 5 MG tablet TAKE 1 TABLET BY MOUTH DAILY   fluticasone  (FLONASE ) 50 MCG/ACT nasal spray INSTILL TWO (2) SPRAYS IN EACH NOSTRIL TWICE DAILY   furosemide  (LASIX ) 20 MG tablet Take  20 mg by mouth daily.   OXYGEN  Inhale 3 L into the lungs daily. continuous   OZEMPIC, 1 MG/DOSE, 4 MG/3ML SOPN SMARTSIG:0.75 Milliliter(s) SUB-Q Once a Week   roflumilast  (DALIRESP ) 500 MCG TABS tablet TAKE 1 TABLET ( ) BY MOUTH DAILY   sildenafil  (VIAGRA ) 50 MG tablet Take 1 tablet (50 mg total) by mouth daily as needed for erectile dysfunction.   spironolactone (ALDACTONE) 25 MG tablet Take 25 mg by mouth daily.   TRELEGY ELLIPTA  200-62.5-25 MCG/ACT AEPB INHALE ONE (1) PUFF BY MOUTH DAILY   promethazine -dextromethorphan (PROMETHAZINE -DM) 6.25-15 MG/5ML syrup Take 5 mLs by mouth 4 (four) times daily as needed. (Patient not taking: Reported on 03/25/2024)   No facility-administered encounter medications on file as of 04/28/2024.   Physical Exam: Blood pressure 110/84, pulse 87, height 5' 8 (1.727 m), weight 238 lb 12.8 oz (108.3 kg), SpO2 (!) 89%. Gen:      No acute distress HEENT:  EOMI, sclera anicteric Neck:     No masses; no thyromegaly Lungs:    Clear to auscultation bilaterally; normal respiratory effort CV:         Regular rate and rhythm; no murmurs Abd:      + bowel sounds; soft, non-tender; no palpable masses, no distension Ext:    No edema; adequate peripheral perfusion Neuro: alert and oriented x 3 Psych: normal mood and affect   Data Reviewed: Imaging Screening CT chest 07/14/2019-emphysema, subcentimeter pulmonary nodule.  Atelectasis along the right major fissure.  Screening CT chest 08/21/2020-emphysema, upper lobe predominant.  Stable pulmonary nodules CT chest at [facility] 08/07/2021-stable pulmonary nodules Screening CT chest 08/23/2022-emphysema, stable pulmonary nodules measuring 5.1 mm Screening CT chest 08/25/2023-emphysema, stable pulmonary nodules I have reviewed the images personally.  PFTs  01/16/16 FVC 2.43 [58%], FEV1 1.49 [45%), F/F 61, TLC 81%,DLCO 56% Severe obstruction, moderate reduction in diffusion capacity.  11/27/2020 FVC 2.04 [56%], FEV1 1.08  [37%], F/F 53, TLC 6.36 [99%], DLCO 12.91, 50%] Severe obstruction with air trapping and bronchodilator response.  Severe diffusion defect.  Sleep PSG 10/22/15  evere OSA, AHI 151. Started on an AutoSet CPAP titration study  Initiate CPAP at 12 with 2 L oxygen , fullface mask.   CPAP compliance 09/27/2020 100% compliant with good response   Labs CBC 08/24/17-absolute eosinophil count 254 CBC 06/13/2019-WBC 12.7, eos 1%, absolute eosinophil count 127 Alpha-1 antitrypsin 06/15/2019-134, PI MM  Cardiac Echo (09/18/15) The right ventricular systolic pressure was increased consistent  with moderate pulmonary hypertension.moderate concentric LVH. LVEF 60-65 percent. Grade 2 diastolic dysfunction. RV cavity size severe grade dilated. RV systolic function moderately to severely reduced. PA pressure 56  Icpet 01/24/2022 Elevated right and left sided pressures at rest and with exercise. indicative of pre and post capillary PH.  Assessment & Plan Severe Obstructive Sleep Apnea Severe obstructive sleep apnea diagnosed in 2016 with an AHI of 151. Despite significant weight loss, the condition necessitates continued CPAP therapy, which aids in managing pulmonary hypertension. He experiences discomfort with CPAP but acknowledges its necessity. - Continue CPAP therapy at 12 cm H2O. - Consider repeating  sleep study after further weight loss to reassess CPAP necessity.  Severe COPD Severe COPD is well-managed on current medications. Absence of eosinophilic inflammation makes Dupixent unnecessary. He reports clear sputum, indicating effective management. Pulmonary rehabilitation may be beneficial. - Continue Trelegy and Daliresp . - Refer to pulmonary rehabilitation at [facility], as insurance may cover it after four years. - Continue low dose screening CT of chest  Pre and Post Capillary Pulmonary Hypertension Pre and post capillary pulmonary hypertension diagnosed in 2023, likely multifactorial due to  cardiac issues, obesity, COPD, and sleep apnea. CPAP therapy aids in management.  - Continue CPAP therapy, weight loss, COPD management  Obesity Obesity with a BMI of 36. Significant weight loss achieved with Ozempic and Farxiga. Further weight loss is needed to potentially reduce sleep apnea severity and improve overall health. - Continue Ozempic and Farxiga. - Encourage further weight loss to reach a BMI below 35 before repeating sleep study.   Health maintenance Up-to-date with Covid booster. Up-to-date with flu and pneumonia vaccination  Plan/Recommendations: Continue Trelegy, Daliresp , supplemental oxygen  Continue CPAP Pulmonary rehabilitation Annual CT chest  Lonna Coder MD [facility] 04/28/2024, 11:06 AM  CC: Tobie Suzzane POUR, MD

## 2024-04-28 NOTE — Patient Instructions (Signed)
 VISIT SUMMARY:  Today, you were seen for evaluation of your CPAP use and weight management. You have been using a CPAP machine since 2016 for severe sleep apnea and have experienced significant weight loss from 262 pounds to 230 pounds. You also have a history of severe COPD, pulmonary hypertension, and congestive heart failure. Your current medications include Ozempic, Trelegy, Daliresp , and various supplements. We discussed your current treatment plan and potential future steps.  YOUR PLAN:  -SEVERE OBSTRUCTIVE SLEEP APNEA: Severe obstructive sleep apnea is a condition where your airway becomes blocked during sleep, causing breathing pauses. You should continue using your CPAP machine at a pressure of 12 cm H2O. We may consider repeating your sleep study after you lose more weight to see if you still need the CPAP.  -SEVERE COPD: Chronic obstructive pulmonary disease (COPD) is a lung condition that makes it hard to breathe. Your COPD is well-managed with your current medications, Trelegy and Daliresp . Since your eosinophil count is low, Dupixent is not necessary. We will refer you to pulmonary rehabilitation at [facility], as your insurance may cover it after four years.  -PRE AND POST CAPILLARY PULMONARY HYPERTENSION: Pulmonary hypertension is high blood pressure in the lungs' arteries, which can be caused by heart issues, obesity, COPD, and sleep apnea. Your CPAP therapy helps manage this condition. We will continue with your current CPAP therapy.   -OBESITY: Obesity is having an excessive amount of body fat, which can affect your overall health. You have made significant progress with weight loss using Ozempic and Farxiga. Continue these medications and aim to reduce your BMI below 35. This may help reduce the severity of your sleep apnea and improve your overall health.  INSTRUCTIONS:  Please continue using your CPAP machine at the prescribed settings and take your medications as directed. We will  refer you to pulmonary rehabilitation at [facility]. Aim to reduce your BMI below 35, and we may consider repeating your sleep study after further weight loss.

## 2024-05-03 ENCOUNTER — Encounter (HOSPITAL_COMMUNITY): Payer: Self-pay | Admitting: *Deleted

## 2024-05-05 DIAGNOSIS — J449 Chronic obstructive pulmonary disease, unspecified: Secondary | ICD-10-CM | POA: Diagnosis not present

## 2024-05-06 NOTE — Patient Instructions (Signed)
 Ricardo Burch  05/06/2024     @PREFPERIOPPHARMACY @   Your procedure is scheduled on  05/12/2024.   Report to [facility] at  0745 A.M.   Call this number if you have problems the morning of surgery:  [phone]  If you experience any cold or flu symptoms such as cough, fever, chills, shortness of breath, etc. between now and your scheduled surgery, please notify us  at the above number.   Remember:         Your last dose of ozempic should have been on 05/04/2024.         Use your inhalers before you come and bring your rescue inhaler with you.   Follow the diet and prep instructions given to you by the office.   You may drink clear liquids until 0545 am on 05/12/2024.    Clear liquids allowed are:                    Water, Juice (No red color; non-citric and without pulp; diabetics please choose diet or no sugar options), Carbonated beverages (diabetics please choose diet or no sugar options), Clear Tea (No creamer, milk, or cream, including half & half and powdered creamer), Black Coffee Only (No creamer, milk or cream, including half & half and powdered creamer), and Clear Sports drink (No red color; diabetics please choose diet or no sugar options)    Take these medicines the morning of surgery with A SIP OF WATER                                                         amlodipine .    Do not wear jewelry, make-up or nail polish, including gel polish,  artificial nails, or any other type of covering on natural nails (fingers and  toes).  Do not wear lotions, powders, or perfumes, or deodorant.  Do not shave 48 hours prior to surgery.  Men may shave face and neck.  Do not bring valuables to the hospital.  [facility] is not responsible for any belongings or valuables.  Contacts, dentures or bridgework may not be worn into surgery.  Leave your suitcase in the car.  After surgery it may be brought to your room.  For patients admitted to the hospital, discharge time will be  determined by your treatment team.  Patients discharged the day of surgery will not be allowed to drive home and must have someone with them for 24 hours.    Special instructions:  DO NOT smoke tobacco or vape for 24 hours before your procedure.  Please read over the following fact sheets that you were given. Anesthesia Post-op Instructions and Care and Recovery After Surgery      Colonoscopy, Adult, Care After The following information offers guidance on how to care for yourself after your procedure. Your health care provider may also give you more specific instructions. If you have problems or questions, contact your health care provider. What can I expect after the procedure? After the procedure, it is common to have: A Boys amount of blood in your stool for 24 hours after the procedure. Some gas. Mild cramping or bloating of your abdomen. Follow these instructions at home: Eating and drinking  Drink enough fluid to keep your urine pale yellow.  Follow instructions from your health care provider about eating or drinking restrictions. Resume your normal diet as told by your health care provider. Avoid heavy or fried foods that are hard to digest. Activity Rest as told by your health care provider. Avoid sitting for a long time without moving. Get up to take short walks every 1-2 hours. This is important to improve blood flow and breathing. Ask for help if you feel weak or unsteady. Return to your normal activities as told by your health care provider. Ask your health care provider what activities are safe for you. Managing cramping and bloating  Try walking around when you have cramps or feel bloated. If directed, apply heat to your abdomen as told by your health care provider. Use the heat source that your health care provider recommends, such as a moist heat pack or a heating pad. Place a towel between your skin and the heat source. Leave the heat on for 20-30 minutes. Remove  the heat if your skin turns bright red. This is especially important if you are unable to feel pain, heat, or cold. You have a greater risk of getting burned. General instructions If you were given a sedative during the procedure, it can affect you for several hours. Do not drive or operate machinery until your health care provider says that it is safe. For the first 24 hours after the procedure: Do not sign important documents. Do not drink alcohol. Do your regular daily activities at a slower pace than normal. Eat soft foods that are easy to digest. Take over-the-counter and prescription medicines only as told by your health care provider. Keep all follow-up visits. This is important. Contact a health care provider if: You have blood in your stool 2-3 days after the procedure. Get help right away if: You have more than a Bunda spotting of blood in your stool. You have large blood clots in your stool. You have swelling of your abdomen. You have nausea or vomiting. You have a fever. You have increasing pain in your abdomen that is not relieved with medicine. These symptoms may be an emergency. Get help right away. Call 911. Do not wait to see if the symptoms will go away. Do not drive yourself to the hospital. Summary After the procedure, it is common to have a Savastano amount of blood in your stool. You may also have mild cramping and bloating of your abdomen. If you were given a sedative during the procedure, it can affect you for several hours. Do not drive or operate machinery until your health care provider says that it is safe. Get help right away if you have a lot of blood in your stool, nausea or vomiting, a fever, or increased pain in your abdomen. This information is not intended to replace advice given to you by your health care provider. Make sure you discuss any questions you have with your health care provider. Document Revised: 12/03/2022 Document Reviewed: 06/13/2021 Elsevier  Patient Education  2024 Else[employer].General Anesthesia, Adult, Care After The following information offers guidance on how to care for yourself after your procedure. Your health care provider may also give you more specific instructions. If you have problems or questions, contact your health care provider. What can I expect after the procedure? After the procedure, it is common for people to: Have pain or discomfort at the IV site. Have nausea or vomiting. Have a sore throat or hoarseness. Have trouble concentrating. Feel cold or chills. Feel weak, sleepy,  or tired (fatigue). Have soreness and body aches. These can affect parts of the body that were not involved in surgery. Follow these instructions at home: For the time period you were told by your health care provider:  Rest. Do not participate in activities where you could fall or become injured. Do not drive or use machinery. Do not drink alcohol. Do not take sleeping pills or medicines that cause drowsiness. Do not make important decisions or sign legal documents. Do not take care of children on your own. General instructions Drink enough fluid to keep your urine pale yellow. If you have sleep apnea, surgery and certain medicines can increase your risk for breathing problems. Follow instructions from your health care provider about wearing your sleep device: Anytime you are sleeping, including during daytime naps. While taking prescription pain medicines, sleeping medicines, or medicines that make you drowsy. Return to your normal activities as told by your health care provider. Ask your health care provider what activities are safe for you. Take over-the-counter and prescription medicines only as told by your health care provider. Do not use any products that contain nicotine  or tobacco. These products include cigarettes, chewing tobacco, and vaping devices, such as e-cigarettes. These can delay incision healing after surgery. If  you need help quitting, ask your health care provider. Contact a health care provider if: You have nausea or vomiting that does not get better with medicine. You vomit every time you eat or drink. You have pain that does not get better with medicine. You cannot urinate or have bloody urine. You develop a skin rash. You have a fever. Get help right away if: You have trouble breathing. You have chest pain. You vomit blood. These symptoms may be an emergency. Get help right away. Call 911. Do not wait to see if the symptoms will go away. Do not drive yourself to the hospital. Summary After the procedure, it is common to have a sore throat, hoarseness, nausea, vomiting, or to feel weak, sleepy, or fatigue. For the time period you were told by your health care provider, do not drive or use machinery. Get help right away if you have difficulty breathing, have chest pain, or vomit blood. These symptoms may be an emergency. This information is not intended to replace advice given to you by your health care provider. Make sure you discuss any questions you have with your health care provider. Document Revised: 01/18/2022 Document Reviewed: 01/18/2022 Elsevier Patient Education  2024 [employer].

## 2024-05-10 ENCOUNTER — Encounter (HOSPITAL_COMMUNITY): Payer: Self-pay

## 2024-05-10 ENCOUNTER — Encounter (HOSPITAL_COMMUNITY)
Admission: RE | Admit: 2024-05-10 | Discharge: 2024-05-10 | Disposition: A | Discharge status: Home / Self Care | Source: Ambulatory Visit | Attending: Internal Medicine | Admitting: Internal Medicine

## 2024-05-10 VITALS — BP 119/79 | HR 85 | Resp 118 | Ht 68.0 in | Wt 238.8 lb

## 2024-05-10 DIAGNOSIS — Z01818 Encounter for other preprocedural examination: Secondary | ICD-10-CM | POA: Insufficient documentation

## 2024-05-10 DIAGNOSIS — Z79899 Other long term (current) drug therapy: Secondary | ICD-10-CM | POA: Diagnosis not present

## 2024-05-10 LAB — RAPID URINE DRUG SCREEN, HOSP PERFORMED
Amphetamines: NOT DETECTED
Barbiturates: NOT DETECTED
Benzodiazepines: NOT DETECTED
Cocaine: NOT DETECTED
Opiates: NOT DETECTED
Tetrahydrocannabinol: NOT DETECTED

## 2024-05-10 LAB — BASIC METABOLIC PANEL WITH GFR
Anion gap: 12 (ref 5–15)
BUN: 13 mg/dL (ref 6–20)
CO2: 26 mmol/L (ref 22–32)
Calcium: 8.8 mg/dL — ABNORMAL LOW (ref 8.9–10.3)
Chloride: 99 mmol/L (ref 98–111)
Creatinine, Ser: 0.83 mg/dL (ref 0.61–1.24)
GFR, Estimated: >60 mL/min (ref >60)
Glucose, Bld: 76 mg/dL (ref 70–99)
Potassium: 4.3 mmol/L (ref 3.5–5.1)
Sodium: 137 mmol/L (ref 135–145)

## 2024-05-11 ENCOUNTER — Encounter (HOSPITAL_COMMUNITY)
Admission: RE | Admit: 2024-05-11 | Discharge: 2024-05-11 | Disposition: A | Discharge status: Home / Self Care | Source: Ambulatory Visit | Attending: Pulmonary Disease | Admitting: Pulmonary Disease

## 2024-05-11 DIAGNOSIS — J4489 Other specified chronic obstructive pulmonary disease: Secondary | ICD-10-CM | POA: Insufficient documentation

## 2024-05-11 DIAGNOSIS — J439 Emphysema, unspecified: Secondary | ICD-10-CM | POA: Insufficient documentation

## 2024-05-11 NOTE — Progress Notes (Signed)
 Completed virtual orientation today.  EP evaluation is scheduled for 05/17/24 at 1000 .  Documentation for diagnosis can be found in [facility] [place] encounter 04/28/24.

## 2024-05-12 ENCOUNTER — Ambulatory Visit (HOSPITAL_COMMUNITY): Admitting: Anesthesiology

## 2024-05-12 ENCOUNTER — Encounter (HOSPITAL_COMMUNITY): Payer: Self-pay | Admitting: Internal Medicine

## 2024-05-12 ENCOUNTER — Other Ambulatory Visit: Payer: Self-pay

## 2024-05-12 ENCOUNTER — Encounter (HOSPITAL_COMMUNITY)
Admission: RE | Disposition: A | Discharge status: Home / Self Care | Payer: Self-pay | Source: Ambulatory Visit | Attending: Internal Medicine

## 2024-05-12 ENCOUNTER — Ambulatory Visit (HOSPITAL_COMMUNITY)
Admission: RE | Admit: 2024-05-12 | Discharge: 2024-05-12 | Disposition: A | Discharge status: Home / Self Care | Source: Ambulatory Visit | Attending: Internal Medicine | Admitting: Internal Medicine

## 2024-05-12 DIAGNOSIS — E66813 Obesity, class 3: Secondary | ICD-10-CM | POA: Diagnosis not present

## 2024-05-12 DIAGNOSIS — I5032 Chronic diastolic (congestive) heart failure: Secondary | ICD-10-CM | POA: Insufficient documentation

## 2024-05-12 DIAGNOSIS — G4730 Sleep apnea, unspecified: Secondary | ICD-10-CM | POA: Insufficient documentation

## 2024-05-12 DIAGNOSIS — J439 Emphysema, unspecified: Secondary | ICD-10-CM | POA: Insufficient documentation

## 2024-05-12 DIAGNOSIS — I509 Heart failure, unspecified: Secondary | ICD-10-CM | POA: Diagnosis not present

## 2024-05-12 DIAGNOSIS — Z1211 Encounter for screening for malignant neoplasm of colon: Secondary | ICD-10-CM | POA: Diagnosis not present

## 2024-05-12 DIAGNOSIS — Z6836 Body mass index (BMI) 36.0-36.9, adult: Secondary | ICD-10-CM | POA: Insufficient documentation

## 2024-05-12 DIAGNOSIS — I110 Hypertensive heart disease with heart failure: Secondary | ICD-10-CM

## 2024-05-12 DIAGNOSIS — K219 Gastro-esophageal reflux disease without esophagitis: Secondary | ICD-10-CM | POA: Insufficient documentation

## 2024-05-12 DIAGNOSIS — Z7951 Long term (current) use of inhaled steroids: Secondary | ICD-10-CM | POA: Insufficient documentation

## 2024-05-12 DIAGNOSIS — I130 Hypertensive heart and chronic kidney disease with heart failure and stage 1 through stage 4 chronic kidney disease, or unspecified chronic kidney disease: Secondary | ICD-10-CM | POA: Diagnosis not present

## 2024-05-12 DIAGNOSIS — Z87891 Personal history of nicotine dependence: Secondary | ICD-10-CM | POA: Insufficient documentation

## 2024-05-12 DIAGNOSIS — K5730 Diverticulosis of large intestine without perforation or abscess without bleeding: Secondary | ICD-10-CM | POA: Diagnosis not present

## 2024-05-12 DIAGNOSIS — Z860100 Personal history of colon polyps, unspecified: Secondary | ICD-10-CM | POA: Insufficient documentation

## 2024-05-12 HISTORY — PX: COLONOSCOPY: SHX5424

## 2024-05-12 LAB — GLUCOSE, CAPILLARY: Glucose-Capillary: 85 mg/dL (ref 70–99)

## 2024-05-12 SURGERY — COLONOSCOPY
Anesthesia: General

## 2024-05-12 MED ORDER — LACTATED RINGERS IV SOLN: INTRAVENOUS | Status: DC | PRN

## 2024-05-12 MED ORDER — LIDOCAINE 2% (20 MG/ML) 5 ML SYRINGE
INTRAMUSCULAR | Status: DC | PRN
  Administered 2024-05-12: 100 mg via INTRAVENOUS

## 2024-05-12 MED ORDER — PROPOFOL 500 MG/50ML IV EMUL
INTRAVENOUS | Status: DC | PRN
  Administered 2024-05-12: 125 ug/kg/min via INTRAVENOUS

## 2024-05-12 MED ORDER — PHENYLEPHRINE 80 MCG/ML (10ML) SYRINGE FOR IV PUSH (FOR BLOOD PRESSURE SUPPORT)
PREFILLED_SYRINGE | INTRAVENOUS | Status: AC
  Filled 2024-05-12: qty 10

## 2024-05-12 MED ORDER — PHENYLEPHRINE 80 MCG/ML (10ML) SYRINGE FOR IV PUSH (FOR BLOOD PRESSURE SUPPORT)
PREFILLED_SYRINGE | INTRAVENOUS | Status: DC | PRN
  Administered 2024-05-12 (×2): 160 ug via INTRAVENOUS

## 2024-05-12 MED ORDER — PROPOFOL 500 MG/50ML IV EMUL
INTRAVENOUS | Status: AC
  Filled 2024-05-12: qty 50

## 2024-05-12 MED ORDER — PROPOFOL 10 MG/ML IV BOLUS
INTRAVENOUS | Status: DC | PRN
  Administered 2024-05-12 (×3): 25 mg via INTRAVENOUS

## 2024-05-12 MED ORDER — LIDOCAINE 2% (20 MG/ML) 5 ML SYRINGE
INTRAMUSCULAR | Status: AC
  Filled 2024-05-12: qty 5

## 2024-05-12 NOTE — Op Note (Signed)
 [facility] Patient Name: Ricardo Burch Procedure Date: 05/12/2024 7:05 AM MRN: [id number] Date of Birth: [birth year] Attending MD: Lamar Ozell Hollingshead , MD, [id number] CSN: [id number] Age: 63 Admit Type: Outpatient Procedure:                Colonoscopy Indications:              High risk colon cancer surveillance: Personal                            history of colonic polyps Providers:                Lamar Ozell Hollingshead, MD, Crystal Page, Bascom Blush Referring MD:              Medicines:                Propofol  per Anesthesia Complications:            No immediate complications. Estimated Blood Loss:     Estimated blood loss: none. Procedure:                Pre-Anesthesia Assessment:                           - Prior to the procedure, a History and Physical                            was performed, and patient medications and                            allergies were reviewed. The patient's tolerance of                            previous anesthesia was also reviewed. The risks                            and benefits of the procedure and the sedation                            options and risks were discussed with the patient.                            All questions were answered, and informed consent                            was obtained. Prior Anticoagulants: The patient has                            taken no anticoagulant or antiplatelet agents. ASA                            Grade Assessment: III - A patient with severe                            systemic disease. After reviewing the risks and  benefits, the patient was deemed in satisfactory                            condition to undergo the procedure.                           After obtaining informed consent, the colonoscope                            was passed under direct vision. Throughout the                            procedure, the patient's blood pressure, pulse, and                             oxygen  saturations were monitored continuously. The                            [id number]) scope was introduced through                            the anus and advanced to the the cecum, identified                            by appendiceal orifice and ileocecal valve. The                            colonoscopy was performed without difficulty. The                            patient tolerated the procedure well. The quality                            of the bowel preparation was adequate. The                            ileocecal valve, appendiceal orifice, and rectum                            were photographed. The entire colon was well                            visualized. The patient tolerated the procedure                            well. The quality of the bowel preparation was                            adequate. Scope In: 7:41:04 AM Scope Out: 7:52:20 AM Scope Withdrawal Time: 0 hours 6 minutes 45 seconds  Total Procedure Duration: 0 hours 11 minutes 16 seconds  Findings:      The perianal and digital rectal examinations were normal.      Scattered medium-mouthed diverticula were found in the entire colon.  The exam was otherwise without abnormality on direct and retroflexion       views. Impression:               - Diverticulosis in the entire examined colon.                           - The examination was otherwise normal on direct                            and retroflexion views.                           - No specimens collected. Moderate Sedation:      Moderate (conscious) sedation was personally administered by an       anesthesia professional. The following parameters were monitored: oxygen        saturation, heart rate, blood pressure, respiratory rate, EKG, adequacy       of pulmonary ventilation, and response to care. Recommendation:           - Patient has a contact number available for                            emergencies. The signs and  symptoms of potential                            delayed complications were discussed with the                            patient. Return to normal activities tomorrow.                            Written discharge instructions were provided to the                            patient.                           - Advance diet as tolerated.                           - Repeat colonoscopy in 7 years for surveillance.                           - Return to GI office (date not yet determined). Procedure Code(s):        --- Professional ---                           [phone], Colonoscopy, flexible; diagnostic, including                            collection of specimen(s) by brushing or washing,                            when performed (separate procedure) Diagnosis Code(s):        --- Professional ---  Z86.010, Personal history of colonic polyps                           K57.30, Diverticulosis of large intestine without                            perforation or abscess without bleeding CPT copyright 2022 American Medical Association. All rights reserved. The codes documented in this report are preliminary and upon coder review may  be revised to meet current compliance requirements. Lamar HERO. Davida Falconi, MD Lamar Ozell Hollingshead, MD 05/12/2024 8:00:34 AM This report has been signed electronically. Number of Addenda: 0

## 2024-05-12 NOTE — H&P (Signed)
 @LOGO @   Primary Care Physician:  Tobie Suzzane POUR, MD Primary Gastroenterologist:  Dr. Shaaron  Pre-Procedure History & Physical: HPI:  Ricardo Burch is a 63 y.o. male here for surveillance colonoscopy adenomas removed 2019  Past Medical History:  Diagnosis Date   Allergy    CHF (congestive heart failure) (HCC)    HFpEF   Chronic kidney disease    COPD (chronic obstructive pulmonary disease) (HCC) Dx 2015   Depression    Eczema    since childhood    Emphysema of lung (HCC)    Erectile dysfunction 08/18/2019   GERD (gastroesophageal reflux disease) 01/25/2020   Hyperlipidemia 01/12/2018   Hypertension    Oxygen  deficiency    Screening for HIV (human immunodeficiency virus)    Sleep apnea    CPAP   Substance abuse (HCC)    MJ, cocaine   Testicular failure 07/22/2019    Past Surgical History:  Procedure Laterality Date   COLONOSCOPY WITH PROPOFOL  N/A 06/25/2017   Surgeon: Shaaron Lamar HERO, MD;  diverticulosis, two 4 to 6 mm polyps resected and retrieved.  Pathology with tubular adenoma x 1 and benign colonic mucosa.  Recommended 5-year surveillance.   MULTIPLE EXTRACTIONS WITH ALVEOLOPLASTY N/A 03/22/2016   Procedure: MULTIPLE EXTRACTION WITH ALVEOLOPLASTY;  Surgeon: Glendia Primrose, DDS;  Location: [facility] OR;  Service: Oral Surgery;  Laterality: N/A;   NO PAST SURGERIES     POLYPECTOMY  06/25/2017   Procedure: POLYPECTOMY;  Surgeon: Shaaron Lamar HERO, MD;  Location: [facility] ENDO SUITE;  Service: Endoscopy;;  colon    Prior to Admission medications   Medication Sig Start Date End Date Taking? Authorizing Provider  albuterol  (VENTOLIN  HFA) 108 (90 Base) MCG/ACT inhaler INHALE 1-2 PUFFS BY MOUTH EVERY 6 HOURS AS NEEDED FOR WHEEZING AND SHORTNESS OF BREATH 04/21/24  Yes Mannam, Praveen, MD  amLODipine  (NORVASC ) 5 MG tablet TAKE 1 TABLET BY MOUTH DAILY 02/19/24  Yes Tobie Suzzane POUR, MD  furosemide  (LASIX ) 20 MG tablet Take 20 mg by mouth daily. 12/25/21  Yes [provider]  OXYGEN  Inhale  3 L into the lungs daily. continuous   Yes [provider]  OZEMPIC, 1 MG/DOSE, 4 MG/3ML SOPN SMARTSIG:0.75 Milliliter(s) SUB-Q Once a Week   Yes [provider]  spironolactone (ALDACTONE) 25 MG tablet Take 25 mg by mouth daily. 09/17/22  Yes [provider]  TRELEGY ELLIPTA  200-62.5-25 MCG/ACT AEPB INHALE ONE (1) PUFF BY MOUTH DAILY 08/23/23  Yes Mannam, Praveen, MD  fluticasone  (FLONASE ) 50 MCG/ACT nasal spray INSTILL TWO (2) SPRAYS IN EACH NOSTRIL TWICE DAILY 04/21/24   Mannam, Praveen, MD  promethazine -dextromethorphan (PROMETHAZINE -DM) 6.25-15 MG/5ML syrup Take 5 mLs by mouth 4 (four) times daily as needed. Patient not taking: Reported on 03/25/2024 02/11/24   Tobie Suzzane POUR, MD  roflumilast  (DALIRESP ) 500 MCG TABS tablet TAKE 1 TABLET ( ) BY MOUTH DAILY 09/26/23   Mannam, Praveen, MD  sildenafil  (VIAGRA ) 50 MG tablet Take 1 tablet (50 mg total) by mouth daily as needed for erectile dysfunction. Patient not taking: Reported on 05/11/2024 08/12/23   Tobie Suzzane POUR, MD    Allergies as of 03/25/2024 - Review Complete 03/25/2024  Allergen Reaction Noted   Apresoline  [hydralazine ] Other (See Comments) 07/10/2016    Family History  Problem Relation Age of Onset   Arthritis Mother    Hypertension Mother    Stroke Mother        age 1   Cerebral aneurysm Father    Alcohol abuse Father  Cancer Brother    Diabetes Neg Hx    Heart disease Neg Hx    Colon cancer Neg Hx     Social History   Socioeconomic History   Marital status: Single    Spouse name: Not on file   Number of children: 5   Years of education: 12   Highest education level: Associate degree: occupational, Scientist, product/process development, or vocational program  Occupational History   Occupation: Unemployed     Comment: car wash details  Tobacco Use   Smoking status: Former    Current packs/day: 0.00    Average packs/day: 1 pack/day for 38.0 years (38.0 ttl pk-yrs)    Types: Cigarettes    Start date:  02/20/1982    Quit date: 02/21/2020    Years since quitting: 4.2   Smokeless tobacco: Never  Vaping Use   Vaping status: Never Used  Substance and Sexual Activity   Alcohol use: Yes    Alcohol/week: 1.0 standard drink of alcohol    Types: 1 Cans of beer per week    Comment: Socially x 1/month currently (as of 01/25/20); previously: occassionally: weekends, 6-pack or less on a day; or half pint of liquior   Drug use: Not Currently    Types: Cocaine, Marijuana    Comment: 01/25/20-marijuana few weeks ago, no cocaine (4-6 yeats ago)   Sexual activity: Never  Other Topics Concern   Not on file  Social History Narrative    Lives alone.    5 daughters 82, 36 yo twins, eldest 2 are married live in [place]. Retired.Girlfriend works in Programmer, applications.   Social Drivers of Corporate investment banker Strain: Low Risk  (01/06/2024)   Overall Financial Resource Strain (CARDIA)    Difficulty of Paying Living Expenses: Not hard at all  Food Insecurity: No Food Insecurity (01/06/2024)   Hunger Vital Sign    Worried About Running Out of Food in the Last Year: Never true    Ran Out of Food in the Last Year: Never true  Transportation Needs: No Transportation Needs (01/06/2024)   PRAPARE - Administrator, Civil Service (Medical): No    Lack of Transportation (Non-Medical): No  Physical Activity: Sufficiently Active (01/06/2024)   Exercise Vital Sign    Days of Exercise per Week: 4 days    Minutes of Exercise per Session: 60 min  Stress: No Stress Concern Present (01/06/2024)   [employer] of Occupational Health - Occupational Stress Questionnaire    Feeling of Stress : Only a little  Social Connections: Moderately Integrated (01/06/2024)   Social Connection and Isolation Panel    Frequency of Communication with Friends and Family: More than three times a week    Frequency of Social Gatherings with Friends and Family: More than three times a week    Attends Religious Services: More than 4  times per year    Active Member of [employer] or Organizations: No    Attends Banker Meetings: Never    Marital Status: Married  Catering manager Violence: Not At Risk (01/06/2024)   Humiliation, Afraid, Rape, and Kick questionnaire    Fear of Current or Ex-Partner: No    Emotionally Abused: No    Physically Abused: No    Sexually Abused: No    Review of Systems: See HPI, otherwise negative ROS  Physical Exam: BP 131/84   Pulse 76   Temp 98.4 F (36.9 C)   Resp 20   SpO2 95%  General:  Alert,  Well-developed, well-nourished, pleasant and cooperative in NAD Skin:  Intact without significant lesions or rashes. Neck:  Supple; no masses or thyromegaly. No significant cervical adenopathy. Lungs:  Clear throughout to auscultation.   No wheezes, crackles, or rhonchi. No acute distress. Heart:  Regular rate and rhythm; no murmurs, clicks, rubs,  or gallops. Abdomen: Non-distended, normal bowel sounds.  Soft and nontender without appreciable mass or hepatosplenomegaly.   Impression/Plan: 63 year old gentleman history of colonic adenomas.  Here for surveillance colonoscopy.  The risks, benefits, limitations, alternatives and imponderables have been reviewed with the patient. Questions have been answered. All parties are agreeable.       Notice: This dictation was prepared with Dragon dictation along with smaller phrase technology. Any transcriptional errors that result from this process are unintentional and may not be corrected upon review.

## 2024-05-12 NOTE — Discharge Instructions (Signed)
  Colonoscopy Discharge Instructions  Read the instructions outlined below and refer to this sheet in the next few weeks. These discharge instructions provide you with general information on caring for yourself after you leave the hospital. Your doctor may also give you specific instructions. While your treatment has been planned according to the most current medical practices available, unavoidable complications occasionally occur. If you have any problems or questions after discharge, call Dr. Shaaron at [phone]. ACTIVITY You may resume your regular activity, but move at a slower pace for the next 24 hours.  Take frequent rest periods for the next 24 hours.  Walking will help get rid of the air and reduce the bloated feeling in your belly (abdomen).  No driving for 24 hours (because of the medicine (anesthesia) used during the test).   Do not sign any important legal documents or operate any machinery for 24 hours (because of the anesthesia used during the test).  NUTRITION Drink plenty of fluids.  You may resume your normal diet as instructed by your doctor.  Begin with a light meal and progress to your normal diet. Heavy or fried foods are harder to digest and may make you feel sick to your stomach (nauseated).  Avoid alcoholic beverages for 24 hours or as instructed.  MEDICATIONS You may resume your normal medications unless your doctor tells you otherwise.  WHAT YOU CAN EXPECT TODAY Some feelings of bloating in the abdomen.  Passage of more gas than usual.  Spotting of blood in your stool or on the toilet paper.  IF YOU HAD POLYPS REMOVED DURING THE COLONOSCOPY: No aspirin  products for 7 days or as instructed.  No alcohol for 7 days or as instructed.  Eat a soft diet for the next 24 hours.  FINDING OUT THE RESULTS OF YOUR TEST Not all test results are available during your visit. If your test results are not back during the visit, make an appointment with your caregiver to find out the  results. Do not assume everything is normal if you have not heard from your caregiver or the medical facility. It is important for you to follow up on all of your test results.  SEEK IMMEDIATE MEDICAL ATTENTION IF: You have more than a spotting of blood in your stool.  Your belly is swollen (abdominal distention).  You are nauseated or vomiting.  You have a temperature over 101.  You have abdominal pain or discomfort that is severe or gets worse throughout the day.      No polyps found today!  You do have diverticulosis  Diverticulosis information provided  It is recommended he return for another colonoscopy in 7 years  At patient request, call Barnie Daring at [phone] -rolled to voicemail.  Left a message.

## 2024-05-12 NOTE — Anesthesia Postprocedure Evaluation (Signed)
 Anesthesia Post Note  Patient: Ricardo Burch  Procedure(s) Performed: COLONOSCOPY  Patient location during evaluation: Phase II Anesthesia Type: General Level of consciousness: awake Pain management: pain level controlled Vital Signs Assessment: post-procedure vital signs reviewed and stable Respiratory status: spontaneous breathing and respiratory function stable Cardiovascular status: blood pressure returned to baseline and stable Postop Assessment: no headache and no apparent nausea or vomiting Anesthetic complications: no Comments: Late entry   No notable events documented.   Last Vitals:  Vitals:   05/12/24 0757 05/12/24 0802  BP: (!) 94/54 97/68  Pulse: 81   Resp: 20   Temp: 36.6 C   SpO2: 95%     Last Pain:  Vitals:   05/12/24 0757  TempSrc: Oral  PainSc: 0-No pain                 Yvonna JINNY Bosworth

## 2024-05-12 NOTE — Anesthesia Preprocedure Evaluation (Signed)
 Anesthesia Evaluation  Patient identified by MRN, date of birth, ID band Patient awake    Reviewed: Allergy & Precautions, H&P , NPO status , Patient's Chart, lab work & pertinent test results, reviewed documented beta blocker date and time   Airway Mallampati: II  TM Distance: >3 FB Neck ROM: full    Dental no notable dental hx.    Pulmonary sleep apnea , COPD, former smoker   Pulmonary exam normal breath sounds clear to auscultation       Cardiovascular Exercise Tolerance: Good hypertension, +CHF   Rhythm:regular Rate:Normal     Neuro/Psych  PSYCHIATRIC DISORDERS  Depression    negative neurological ROS     GI/Hepatic Neg liver ROS,GERD  ,,  Endo/Other    Class 3 obesity  Renal/GU Renal disease  negative genitourinary   Musculoskeletal   Abdominal   Peds  Hematology negative hematology ROS (+)   Anesthesia Other Findings   Reproductive/Obstetrics negative OB ROS                              Anesthesia Physical Anesthesia Plan  ASA: 3  Anesthesia Plan: General   Post-op Pain Management:    Induction:   PONV Risk Score and Plan: Propofol  infusion  Airway Management Planned:   Additional Equipment:   Intra-op Plan:   Post-operative Plan:   Informed Consent: I have reviewed the patients History and Physical, chart, labs and discussed the procedure including the risks, benefits and alternatives for the proposed anesthesia with the patient or authorized representative who has indicated his/her understanding and acceptance.     Dental Advisory Given  Plan Discussed with: CRNA  Anesthesia Plan Comments:         Anesthesia Quick Evaluation

## 2024-05-12 NOTE — Transfer of Care (Signed)
 Immediate Anesthesia Transfer of Care Note  Patient: Ricardo Burch  Procedure(s) Performed: COLONOSCOPY  Patient Location: Short Stay  Anesthesia Type:MAC  Level of Consciousness: awake, alert , oriented, and patient cooperative  Airway & Oxygen  Therapy: Patient Spontanous Breathing and Patient connected to nasal cannula oxygen   Post-op Assessment: Report given to RN and Post -op Vital signs reviewed and stable  Post vital signs: Reviewed and stable  Last Vitals:  Vitals Value Taken Time  BP 94/54 05/12/24 08:00  Temp    Pulse 70 05/12/24 08:00  Resp 15 05/12/24 08:00  SpO2 95% on 3L [place] 05/12/24 08:00    Last Pain:  Vitals:   05/12/24 0734  PainSc: 0-No pain      Patients Stated Pain Goal: 6 (05/12/24 0712)  Complications: No notable events documented.

## 2024-05-13 ENCOUNTER — Telehealth: Payer: Self-pay

## 2024-05-13 NOTE — Telephone Encounter (Signed)
 Copied from CRM [id number]. Topic: Clinical - Medication Question >> May 13, 2024  3:09 PM Winona SAUNDERS wrote: Ozell from [facility] calling to follow up on the medication recommendation for the pt that was faxed over.

## 2024-05-14 ENCOUNTER — Encounter (HOSPITAL_COMMUNITY)

## 2024-05-14 ENCOUNTER — Encounter (HOSPITAL_COMMUNITY): Payer: Self-pay | Admitting: Internal Medicine

## 2024-05-17 ENCOUNTER — Encounter (HOSPITAL_COMMUNITY)
Admission: RE | Admit: 2024-05-17 | Discharge: 2024-05-17 | Disposition: A | Discharge status: Home / Self Care | Source: Ambulatory Visit | Attending: Pulmonary Disease

## 2024-05-17 VITALS — Ht 69.0 in | Wt 238.1 lb

## 2024-05-17 DIAGNOSIS — J4489 Other specified chronic obstructive pulmonary disease: Secondary | ICD-10-CM | POA: Diagnosis not present

## 2024-05-17 DIAGNOSIS — J439 Emphysema, unspecified: Secondary | ICD-10-CM | POA: Diagnosis not present

## 2024-05-17 NOTE — Progress Notes (Signed)
 Pulmonary Individual Treatment Plan  Patient Details  Name: Ricardo Burch MRN: [id number] Date of Birth: 1961-11-04 Referring Provider:   Flowsheet Row PULMONARY REHAB OTHER RESP ORIENTATION from 05/17/2024 in [facility] CARDIAC REHABILITATION  Referring Provider Theophilus Roosevelt MD    Initial Encounter Date:  Flowsheet Row PULMONARY REHAB OTHER RESP ORIENTATION from 05/17/2024 in [facility]  Date 05/17/24    Visit Diagnosis: COPD with chronic bronchitis and emphysema (HCC)  Patient's Home Medications on Admission:   Current Outpatient Medications:    albuterol  (VENTOLIN  HFA) 108 (90 Base) MCG/ACT inhaler, INHALE 1-2 PUFFS BY MOUTH EVERY 6 HOURS AS NEEDED FOR WHEEZING AND SHORTNESS OF BREATH, Disp: 8.5 g, Rfl: 11   amLODipine  (NORVASC ) 5 MG tablet, TAKE 1 TABLET BY MOUTH DAILY, Disp: 30 tablet, Rfl: 11   fluticasone  (FLONASE ) 50 MCG/ACT nasal spray, INSTILL TWO (2) SPRAYS IN EACH NOSTRIL TWICE DAILY, Disp: 16 g, Rfl: 11   furosemide  (LASIX ) 20 MG tablet, Take 20 mg by mouth daily., Disp: , Rfl:    OXYGEN , Inhale 3 L into the lungs daily. continuous, Disp: , Rfl:    OZEMPIC, 1 MG/DOSE, 4 MG/3ML SOPN, SMARTSIG:0.75 Milliliter(s) SUB-Q Once a Week, Disp: , Rfl:    roflumilast  (DALIRESP ) 500 MCG TABS tablet, TAKE 1 TABLET ( ) BY MOUTH DAILY, Disp: 30 tablet, Rfl: 11   sildenafil  (VIAGRA ) 50 MG tablet, Take 1 tablet (50 mg total) by mouth daily as needed for erectile dysfunction. (Patient not taking: Reported on 05/11/2024), Disp: 30 tablet, Rfl: 1   spironolactone (ALDACTONE) 25 MG tablet, Take 25 mg by mouth daily., Disp: , Rfl:    TRELEGY ELLIPTA  200-62.5-25 MCG/ACT AEPB, INHALE ONE (1) PUFF BY MOUTH DAILY, Disp: 60 each, Rfl: 10  Past Medical History: Past Medical History:  Diagnosis Date   Allergy    CHF (congestive heart failure) (HCC)    HFpEF   Chronic kidney disease    COPD (chronic obstructive pulmonary disease) (HCC) Dx 2015   Depression    Eczema     since childhood    Emphysema of lung (HCC)    Erectile dysfunction 08/18/2019   GERD (gastroesophageal reflux disease) 01/25/2020   Hyperlipidemia 01/12/2018   Hypertension    Oxygen  deficiency    Screening for HIV (human immunodeficiency virus)    Sleep apnea    CPAP   Substance abuse (HCC)    MJ, cocaine   Testicular failure 07/22/2019    Tobacco Use: Social History   Tobacco Use  Smoking Status Former   Current packs/day: 0.00   Average packs/day: 1 pack/day for 38.0 years (38.0 ttl pk-yrs)   Types: Cigarettes   Start date: 02/20/1982   Quit date: 02/21/2020   Years since quitting: 4.2  Smokeless Tobacco Never    Labs: Review Flowsheet  More data exists      Latest Ref Rng & Units 10/25/2020 05/16/2022 11/20/2022 05/19/2023 01/30/2024  Labs for ITP Cardiac and Pulmonary Rehab  Cholestrol 100 - 199 mg/dL 842  843  839  849  837   LDL (calc) 0 - 99 mg/dL 86  94  99  87  85   HDL-C >39 mg/dL 56  51  47  52  65   Trlycerides 0 - 149 mg/dL 64  52  70  49  60   Hemoglobin A1c 4.8 - 5.6 % 5.8  5.7  5.6  5.7  5.6     Capillary Blood Glucose: Lab Results  Component Value Date  GLUCAP 85 05/12/2024   GLUCAP 84 11/20/2019   GLUCAP 93 05/12/2018   GLUCAP 179 (H) 07/27/2016   GLUCAP 156 (H) 07/27/2016     Pulmonary Assessment Scores:  Pulmonary Assessment Scores     Row Name 05/11/24 1319         ADL UCSD   ADL Phase Entry     SOB Score total 39     Rest 0     Walk 3     Stairs 3     Bath 2     Dress 2     Shop 2       CAT Score   CAT Score 14       UCSD: Self-administered rating of dyspnea associated with activities of daily living (ADLs) 6-point scale (0 = not at all to 5 = maximal or unable to do because of breathlessness)  Scoring Scores range from 0 to 120.  Minimally important difference is 5 units  CAT: CAT can identify the health impairment of COPD patients and is better correlated with disease progression.  CAT has a scoring range of zero  to 40. The CAT score is classified into four groups of low (less than 10), medium (10 - 20), high (21-30) and very high (31-40) based on the impact level of disease on health status. A CAT score over 10 suggests significant symptoms.  A worsening CAT score could be explained by an exacerbation, poor medication adherence, poor inhaler technique, or progression of COPD or comorbid conditions.  CAT MCID is 2 points  mMRC: mMRC (Modified Medical Research Council) Dyspnea Scale is used to assess the degree of baseline functional disability in patients of respiratory disease due to dyspnea. No minimal important difference is established. A decrease in score of 1 point or greater is considered a positive change.   Pulmonary Function Assessment:  Pulmonary Function Assessment - 05/11/24 1311       Pulmonary Function Tests   FVC% 56 %    FEV1% 37 %    FEV1/FVC Ratio 61    DLCO% 50 %          Exercise Target Goals: Exercise Program Goal: Individual exercise prescription set using results from initial 6 min walk test and THRR while considering  patient's activity barriers and safety.   Exercise Prescription Goal: Initial exercise prescription builds to 30-45 minutes a day of aerobic activity, 2-3 days per week.  Home exercise guidelines will be given to patient during program as part of exercise prescription that the participant will acknowledge.  Activity Barriers & Risk Stratification:  Activity Barriers & Cardiac Risk Stratification - 05/11/24 1304       Activity Barriers & Cardiac Risk Stratification   Activity Barriers Muscular Weakness;Deconditioning;Shortness of Breath          6 Minute Walk:  6 Minute Walk     Row Name 05/17/24 1052         6 Minute Walk   Phase Initial     Distance 570 feet     Walk Time 3.46 minutes     # of Rest Breaks 0     MPH 1.87     METS 1.8     RPE 13     Perceived Dyspnea  3     VO2 Peak 6.3     Symptoms Yes (comment)     Comments pt o2  dropped 74 at 3.46 min test stopped     Resting HR 74 bpm  Resting BP 112/78     Resting Oxygen  Saturation  90 %     Exercise Oxygen  Saturation  during 6 min walk 74 %     Max Ex. HR 93 bpm     Max Ex. BP 150/70     2 Minute Post BP 126/70       Interval HR   1 Minute HR 91     2 Minute HR 87     3 Minute HR 93     6 Minute HR 74     2 Minute Post HR 71     Interval Heart Rate? Yes       Interval Oxygen    Interval Oxygen ? Yes     Baseline Oxygen  Saturation % 90 %     1 Minute Oxygen  Saturation % 90 %     1 Minute Liters of Oxygen  0 L     2 Minute Oxygen  Saturation % 86 %     2 Minute Liters of Oxygen  0 L     3 Minute Oxygen  Saturation % 74 %     3 Minute Liters of Oxygen  0 L     6 Minute Oxygen  Saturation % 87 %     6 Minute Liters of Oxygen  0 L     2 Minute Post Oxygen  Saturation % 88 %     2 Minute Post Liters of Oxygen  0 L        Oxygen  Initial Assessment:  Oxygen  Initial Assessment - 05/11/24 1310       Home Oxygen    Home Oxygen  Device Portable Concentrator;Home Concentrator    Sleep Oxygen  Prescription CPAP    Liters per minute 3    Home Exercise Oxygen  Prescription Continuous    Liters per minute 3    Home Resting Oxygen  Prescription Continuous    Liters per minute 3    Compliance with Home Oxygen  Use Yes      Intervention   Short Term Goals To learn and exhibit compliance with exercise, home and travel O2 prescription;To learn and understand importance of monitoring SPO2 with pulse oximeter and demonstrate accurate use of the pulse oximeter.;To learn and understand importance of maintaining oxygen  saturations>88%;To learn and demonstrate proper pursed lip breathing techniques or other breathing techniques. ;To learn and demonstrate proper use of respiratory medications    Long  Term Goals Exhibits compliance with exercise, home  and travel O2 prescription;Verbalizes importance of monitoring SPO2 with pulse oximeter and return demonstration;Maintenance of O2  saturations>88%;Exhibits proper breathing techniques, such as pursed lip breathing or other method taught during program session;Compliance with respiratory medication;Demonstrates proper use of MDI's          Oxygen  Re-Evaluation:   Oxygen  Discharge (Final Oxygen  Re-Evaluation):   Initial Exercise Prescription:  Initial Exercise Prescription - 05/17/24 1000       Date of Initial Exercise RX and Referring Provider   Date 05/17/24    Referring Provider Mannam, Praveen MD      Oxygen    Oxygen  Continuous    Liters 3    Maintain Oxygen  Saturation 88% or higher      Treadmill   MPH 1.2    Grade 0    Minutes 15    METs 1.92      REL-XR   Level 1    Speed 50    Minutes 15    METs 1.9      Prescription Details   Frequency (times per week) 2    Duration  Progress to 30 minutes of continuous aerobic without signs/symptoms of physical distress      Intensity   THRR 40-80% of Max Heartrate 108-143    Ratings of Perceived Exertion 11-13    Perceived Dyspnea 0-4      Resistance Training   Training Prescription Yes    Weight 7    Reps 10-15          Perform Capillary Blood Glucose checks as needed.  Exercise Prescription Changes:   Exercise Prescription Changes     Row Name 05/17/24 1100             Response to Exercise   Blood Pressure (Admit) 112/78       Blood Pressure (Exercise) 150/70       Blood Pressure (Exit) 126/70       Heart Rate (Admit) 74 bpm       Heart Rate (Exercise) 93 bpm       Heart Rate (Exit) 71 bpm       Oxygen  Saturation (Admit) 90 %       Oxygen  Saturation (Exercise) 74 %       Oxygen  Saturation (Exit) 88 %       Rating of Perceived Exertion (Exercise) 13       Perceived Dyspnea (Exercise) 3       Duration Progress to 30 minutes of  aerobic without signs/symptoms of physical distress       Intensity THRR unchanged          Exercise Comments:   Exercise Comments     Row Name 05/11/24 1310 05/17/24 1029         Exercise  Comments Dayn currently doesn't do any home exercise besides ADL's and walking his dog. Patient attend orientation today.  Patient is attending Pulmonary Rehabilitation Program.  Documentation for diagnosis can be found in [facility].  Reviewed medical chart, RPE/RPD, gym safety, and program guidelines.  Patient was fitted to equipment they will be using during rehab.  Patient is scheduled to start exercise on 06/08/24.   Initial ITP created and sent for review and signature by Dr. Anton Kelp, Medical Director for Pulmonary Rehabilitation Program.         Exercise Goals and Review:   Exercise Goals     Row Name 05/11/24 1309             Exercise Goals   Increase Physical Activity Yes       Intervention Provide advice, education, support and counseling about physical activity/exercise needs.;Develop an individualized exercise prescription for aerobic and resistive training based on initial evaluation findings, risk stratification, comorbidities and participant's personal goals.       Expected Outcomes Short Term: Attend rehab on a regular basis to increase amount of physical activity.;Long Term: Add in home exercise to make exercise part of routine and to increase amount of physical activity.;Long Term: Exercising regularly at least 3-5 days a week.       Increase Strength and Stamina Yes       Intervention Provide advice, education, support and counseling about physical activity/exercise needs.;Develop an individualized exercise prescription for aerobic and resistive training based on initial evaluation findings, risk stratification, comorbidities and participant's personal goals.       Expected Outcomes Short Term: Increase workloads from initial exercise prescription for resistance, speed, and METs.;Short Term: Perform resistance training exercises routinely during rehab and add in resistance training at home;Long Term: Improve cardiorespiratory fitness, muscular endurance and strength as measured  by increased METs and functional capacity ( )       Able to understand and use rate of perceived exertion (RPE) scale Yes       Intervention Provide education and explanation on how to use RPE scale       Expected Outcomes Short Term: Able to use RPE daily in rehab to express subjective intensity level;Long Term:  Able to use RPE to guide intensity level when exercising independently       Able to understand and use Dyspnea scale Yes       Intervention Provide education and explanation on how to use Dyspnea scale       Expected Outcomes Short Term: Able to use Dyspnea scale daily in rehab to express subjective sense of shortness of breath during exertion;Long Term: Able to use Dyspnea scale to guide intensity level when exercising independently       Knowledge and understanding of Target Heart Rate Range (THRR) Yes       Intervention Provide education and explanation of THRR including how the numbers were predicted and where they are located for reference       Expected Outcomes Short Term: Able to state/look up THRR;Short Term: Able to use daily as guideline for intensity in rehab;Long Term: Able to use THRR to govern intensity when exercising independently       Able to check pulse independently Yes       Intervention Provide education and demonstration on how to check pulse in carotid and radial arteries.;Review the importance of being able to check your own pulse for safety during independent exercise       Expected Outcomes Short Term: Able to explain why pulse checking is important during independent exercise;Long Term: Able to check pulse independently and accurately       Understanding of Exercise Prescription Yes       Intervention Provide education, explanation, and written materials on patient's individual exercise prescription       Expected Outcomes Short Term: Able to explain program exercise prescription;Long Term: Able to explain home exercise prescription to exercise independently           Exercise Goals Re-Evaluation :   Discharge Exercise Prescription (Final Exercise Prescription Changes):  Exercise Prescription Changes - 05/17/24 1100       Response to Exercise   Blood Pressure (Admit) 112/78    Blood Pressure (Exercise) 150/70    Blood Pressure (Exit) 126/70    Heart Rate (Admit) 74 bpm    Heart Rate (Exercise) 93 bpm    Heart Rate (Exit) 71 bpm    Oxygen  Saturation (Admit) 90 %    Oxygen  Saturation (Exercise) 74 %    Oxygen  Saturation (Exit) 88 %    Rating of Perceived Exertion (Exercise) 13    Perceived Dyspnea (Exercise) 3    Duration Progress to 30 minutes of  aerobic without signs/symptoms of physical distress    Intensity THRR unchanged          Nutrition:  Target Goals: Understanding of nutrition guidelines, daily intake of sodium 1500mg , cholesterol 200mg , calories 30% from fat and 7% or less from saturated fats, daily to have 5 or more servings of fruits and vegetables.  Biometrics:  Pre Biometrics - 05/17/24 1102       Pre Biometrics   Height 5' 9 (1.753 m)    Weight 108 kg    Waist Circumference 47 inches    Hip Circumference 42 inches    Waist to Hip  Ratio 1.12 %    BMI (Calculated) 35.14    Grip Strength 44.2 kg           Nutrition Therapy Plan and Nutrition Goals:  Nutrition Therapy & Goals - 05/11/24 1313       Intervention Plan   Intervention Prescribe, educate and counsel regarding individualized specific dietary modifications aiming towards targeted core components such as weight, hypertension, lipid management, diabetes, heart failure and other comorbidities.;Nutrition handout(s) given to patient.    Expected Outcomes Short Term Goal: Understand basic principles of dietary content, such as calories, fat, sodium, cholesterol and nutrients.;Short Term Goal: A plan has been developed with personal nutrition goals set during dietitian appointment.;Long Term Goal: Adherence to prescribed nutrition plan.           Nutrition Assessments:  MEDIFICTS Score Key: >=70 Need to make dietary changes  40-70 Heart Healthy Diet <= 40 Therapeutic Level Cholesterol Diet  Flowsheet Row PULMONARY VIRTUAL BASED CARE from 05/11/2024 in [facility] CARDIAC REHABILITATION  Picture Your Plate Total Score on Admission 49   Picture Your Plate Scores: <59 Unhealthy dietary pattern with much room for improvement. 41-50 Dietary pattern unlikely to meet recommendations for good health and room for improvement. 51-60 More healthful dietary pattern, with some room for improvement.  >60 Healthy dietary pattern, although there may be some specific behaviors that could be improved.    Nutrition Goals Re-Evaluation:   Nutrition Goals Discharge (Final Nutrition Goals Re-Evaluation):   Psychosocial: Target Goals: Acknowledge presence or absence of significant depression and/or stress, maximize coping skills, provide positive support system. Participant is able to verbalize types and ability to use techniques and skills needed for reducing stress and depression.  Initial Review & Psychosocial Screening:  Initial Psych Review & Screening - 05/11/24 1313       Initial Review   Current issues with None Identified      Family Dynamics   Good Support System? Yes    Comments Johntay didn't specify who his support system was, but he states he has people to help him if needed.      Barriers   Psychosocial barriers to participate in program There are no identifiable barriers or psychosocial needs.      Screening Interventions   Interventions Encouraged to exercise    Expected Outcomes Short Term goal: Identification and review with participant of any Quality of Life or Depression concerns found by scoring the questionnaire.;Long Term goal: The participant improves quality of Life and PHQ9 Scores as seen by post scores and/or verbalization of changes;Long Term Goal: Stressors or current issues are controlled or eliminated.;Short  Term goal: Utilizing psychosocial counselor, staff and physician to assist with identification of specific Stressors or current issues interfering with healing process. Setting desired goal for each stressor or current issue identified.          Quality of Life Scores:  Scores of 19 and below usually indicate a poorer quality of life in these areas.  A difference of  2-3 points is a clinically meaningful difference.  A difference of 2-3 points in the total score of the Quality of Life Index has been associated with significant improvement in overall quality of life, self-image, physical symptoms, and general health in studies assessing change in quality of life.   PHQ-9: Review Flowsheet  More data exists      05/17/2024 02/11/2024 01/20/2024 01/13/2024 01/06/2024  Depression screen PHQ 2/9  Decreased Interest 0 1 0 0 0 0  Down, Depressed, Hopeless  0 1 0 0 0 0  PHQ - 2 Score 0 2 0 0 0 0  Altered sleeping 0 1 0 0 0 0  Tired, decreased energy 1 2 0 0 0 0  Change in appetite 0 0 0 0 0 0  Feeling bad or failure about yourself  0 1 0 0 0 0  Trouble concentrating 0 0 0 0 0 0  Moving slowly or fidgety/restless 1 2 0 0 0 0  Suicidal thoughts 0 0 0 0 0 0  PHQ-9 Score 2 8 0 0 0 0  Difficult doing work/chores Not difficult at all Somewhat difficult Not difficult at all Not difficult at all Not difficult at all Not difficult at all    Details       Multiple values from one day are sorted in reverse-chronological order        Interpretation of Total Score  Total Score Depression Severity:  1-4 = Minimal depression, 5-9 = Mild depression, 10-14 = Moderate depression, 15-19 = Moderately severe depression, 20-27 = Severe depression   Psychosocial Evaluation and Intervention:  Psychosocial Evaluation - 05/11/24 1314       Psychosocial Evaluation & Interventions   Interventions Encouraged to exercise with the program and follow exercise prescription    Comments Giulio is a pleasant gentleman who  is coming into rehab for COPD with chronic bronchitis and emphysema. He has been in the program before but it was for cardiac reasoning.  Kingson states  that he gets around well and is independent with his ADL's. He does not currently do any home exercise besides ADL's and walking his dog outside. He is on 3L oxygen  as needed, and a CPAP at night with 3L. He is having a colonscopy tomorrow and is not very happy about that, but he is eager to start back our program starting Monday the 14th.    Expected Outcomes Short: Increase strength and stamina. Long: Get off oxygen  or be able to wean down eventually.    Continue Psychosocial Services  Follow up required by staff          Psychosocial Re-Evaluation:   Psychosocial Discharge (Final Psychosocial Re-Evaluation):    Education: Education Goals: Education classes will be provided on a weekly basis, covering required topics. Participant will state understanding/return demonstration of topics presented.  Learning Barriers/Preferences:  Learning Barriers/Preferences - 05/11/24 1313       Learning Barriers/Preferences   Learning Barriers None    Learning Preferences Video;Written Material;Pictoral          Education Topics: How Lungs Work and Diseases: - Discuss the anatomy of the lungs and diseases that can affect the lungs, such as COPD. Flowsheet Row PULMONARY REHAB OTHER RESPIRATORY from 06/08/2020 in [place] [facility]  Date 03/12/18  Educator D. Coad  Instruction Review Code 2- Demonstrated Understanding    Exercise: -Discuss the importance of exercise, FITT principles of exercise, normal and abnormal responses to exercise, and how to exercise safely. Flowsheet Row PULMONARY REHAB OTHER RESPIRATORY from 06/08/2020 in [place] [facility]  Date 03/05/18  Educator GC  Instruction Review Code 2- Demonstrated Understanding    Environmental Irritants: -Discuss types of environmental irritants and how to  limit exposure to environmental irritants. Flowsheet Row PULMONARY REHAB OTHER RESPIRATORY from 06/08/2020 in [place] [facility]  Date 05/18/20  Educator CHARM Louder  Instruction Review Code 1- Verbalizes Understanding    Meds/Inhalers and oxygen : - Discuss respiratory medications, definition of an inhaler and oxygen ,  and the proper way to use an inhaler and oxygen . Flowsheet Row PULMONARY REHAB OTHER RESPIRATORY from 06/08/2020 in [place] [facility]  Date 03/26/18  Educator DC    Energy Saving Techniques: - Discuss methods to conserve energy and decrease shortness of breath when performing activities of daily living.  Flowsheet Row PULMONARY REHAB OTHER RESPIRATORY from 06/08/2020 in [place] [facility]  Date 04/02/18  Educator GC  Instruction Review Code 2- Demonstrated Understanding    Bronchial Hygiene / Breathing Techniques: - Discuss breathing mechanics, pursed-lip breathing technique,  proper posture, effective ways to clear airways, and other functional breathing techniques Flowsheet Row PULMONARY REHAB OTHER RESPIRATORY from 06/08/2020 in [place] [facility]  Date 04/09/18  Educator DC  Instruction Review Code 2- Demonstrated Understanding    Cleaning Equipment: - Provides group verbal and written instruction about the health risks of elevated stress, cause of high stress, and healthy ways to reduce stress. Flowsheet Row PULMONARY REHAB OTHER RESPIRATORY from 06/08/2020 in [place] [facility]  Date 01/15/18  Educator GC  Instruction Review Code 2- Demonstrated Understanding    Nutrition I: Fats: - Discuss the types of cholesterol, what cholesterol does to the body, and how cholesterol levels can be controlled. Flowsheet Row PULMONARY REHAB OTHER RESPIRATORY from 06/08/2020 in [facility]  Date 01/22/18  Educator GC  Instruction Review Code 2- Demonstrated Understanding    Nutrition  II: Labels: -Discuss the different components of food labels and how to read food labels. Flowsheet Row PULMONARY REHAB OTHER RESPIRATORY from 06/08/2020 in [place] [facility]  Date 01/29/18  Educator DJ  Instruction Review Code 2- Demonstrated Understanding    Respiratory Infections: - Discuss the signs and symptoms of respiratory infections, ways to prevent respiratory infections, and the importance of seeking medical treatment when having a respiratory infection. Flowsheet Row PULMONARY REHAB OTHER RESPIRATORY from 06/08/2020 in [place] [facility]  Date 02/05/18  Educator GC  Instruction Review Code 2- Demonstrated Understanding    Stress I: Signs and Symptoms: - Discuss the causes of stress, how stress may lead to anxiety and depression, and ways to limit stress.   Stress II: Relaxation: -Discuss relaxation techniques to limit stress. Flowsheet Row PULMONARY REHAB OTHER RESPIRATORY from 06/08/2020 in [facility]  Date 04/20/20  Educator DF  Instruction Review Code 2- Demonstrated Understanding    Oxygen  for Home/Travel: - Discuss how to prepare for travel when on oxygen  and proper ways to transport and store oxygen  to ensure safety.   Knowledge Questionnaire Score:  Knowledge Questionnaire Score - 05/11/24 1324       Knowledge Questionnaire Score   Pre Score 15/18          Core Components/Risk Factors/Patient Goals at Admission:  Personal Goals and Risk Factors at Admission - 05/11/24 1312       Core Components/Risk Factors/Patient Goals on Admission   Improve shortness of breath with ADL's Yes    Intervention Provide education, individualized exercise plan and daily activity instruction to help decrease symptoms of SOB with activities of daily living.    Expected Outcomes Short Term: Improve cardiorespiratory fitness to achieve a reduction of symptoms when performing ADLs;Long Term: Be able to perform more ADLs  without symptoms or delay the onset of symptoms    Increase knowledge of respiratory medications and ability to use respiratory devices properly  Yes    Intervention Provide education and demonstration as needed of appropriate use of medications, inhalers, and oxygen   therapy.    Expected Outcomes Long Term: Maintain appropriate use of medications, inhalers, and oxygen  therapy.;Short Term: Achieves understanding of medications use. Understands that oxygen  is a medication prescribed by physician. Demonstrates appropriate use of inhaler and oxygen  therapy.    Heart Failure Yes    Intervention Provide a combined exercise and nutrition program that is supplemented with education, support and counseling about heart failure. Directed toward relieving symptoms such as shortness of breath, decreased exercise tolerance, and extremity edema.    Expected Outcomes Improve functional capacity of life;Short term: Attendance in program 2-3 days a week with increased exercise capacity. Reported lower sodium intake. Reported increased fruit and vegetable intake. Reports medication compliance.;Short term: Daily weights obtained and reported for increase. Utilizing diuretic protocols set by physician.;Long term: Adoption of self-care skills and reduction of barriers for early signs and symptoms recognition and intervention leading to self-care maintenance.    Hypertension Yes    Intervention Provide education on lifestyle modifcations including regular physical activity/exercise, weight management, moderate sodium restriction and increased consumption of fresh fruit, vegetables, and low fat dairy, alcohol moderation, and smoking cessation.;Monitor prescription use compliance.    Expected Outcomes Short Term: Continued assessment and intervention until BP is < 140/51mm HG in hypertensive participants. < 130/56mm HG in hypertensive participants with diabetes, heart failure or chronic kidney disease.;Long Term: Maintenance of  blood pressure at goal levels.    Lipids Yes    Intervention Provide education and support for participant on nutrition & aerobic/resistive exercise along with prescribed medications to achieve LDL 70mg , HDL >40mg .    Expected Outcomes Long Term: Cholesterol controlled with medications as prescribed, with individualized exercise RX and with personalized nutrition plan. Value goals: LDL < 70mg , HDL > 40 mg.;Short Term: Participant states understanding of desired cholesterol values and is compliant with medications prescribed. Participant is following exercise prescription and nutrition guidelines.          Core Components/Risk Factors/Patient Goals Review:    Core Components/Risk Factors/Patient Goals at Discharge (Final Review):    ITP Comments:  ITP Comments     Row Name 05/11/24 1308 05/17/24 1029         ITP Comments Completed virtual orientation today.  EP evaluation is scheduled for 05/17/24 at 1000 .  Documentation for diagnosis can be found in [facility] encounter 04/28/24. Patient attend orientation today.  Patient is attending Pulmonary Rehabilitation Program.  Documentation for diagnosis can be found in [facility].  Reviewed medical chart, RPE/RPD, gym safety, and program guidelines.  Patient was fitted to equipment they will be using during rehab.  Patient is scheduled to start exercise on 06/08/24.   Initial ITP created and sent for review and signature by Dr. Anton Kelp, Medical Director for Pulmonary Rehabilitation Program.         Comments: Initial ITP.

## 2024-05-17 NOTE — Progress Notes (Signed)
 Patient attend orientation today.  Patient is attending Pulmonary Rehabilitation Program.  Documentation for diagnosis can be found in [facility].  Reviewed medical chart, RPE/RPD, gym safety, and program guidelines.  Patient was fitted to equipment they will be using during rehab.  Patient is scheduled to start exercise on 06/08/24.   Initial ITP created and sent for review and signature by Dr. Anton Kelp, Medical Director for Pulmonary Rehabilitation Program.

## 2024-05-17 NOTE — Patient Instructions (Addendum)
 Patient Instructions  Patient Details  Name: Ricardo Burch MRN: [id number] Date of Birth: 09/02/1961 Referring Provider:  Tobie Suzzane POUR, MD  Below are your personal goals for exercise, nutrition, and risk factors. Our goal is to help you stay on track towards obtaining and maintaining these goals. We will be discussing your progress on these goals with you throughout the program.  Initial Exercise Prescription:  Initial Exercise Prescription - 05/17/24 1000       Date of Initial Exercise RX and Referring Provider   Date 05/17/24    Referring Provider Theophilus Roosevelt MD      Oxygen    Oxygen  Continuous    Liters 3    Maintain Oxygen  Saturation 88% or higher      Treadmill   MPH 1.2    Grade 0    Minutes 15    METs 1.92      REL-XR   Level 1    Speed 50    Minutes 15    METs 1.9      Prescription Details   Frequency (times per week) 2    Duration Progress to 30 minutes of continuous aerobic without signs/symptoms of physical distress      Intensity   THRR 40-80% of Max Heartrate 108-143    Ratings of Perceived Exertion 11-13    Perceived Dyspnea 0-4      Resistance Training   Training Prescription Yes    Weight 7    Reps 10-15          Exercise Goals: Frequency: Be able to perform aerobic exercise two to three times per week in program working toward 2-5 days per week of home exercise.  Intensity: Work with a perceived exertion of 11 (fairly light) - 15 (hard) while following your exercise prescription.  We will make changes to your prescription with you as you progress through the program.   Duration: Be able to do 30 to 45 minutes of continuous aerobic exercise in addition to a 5 minute warm-up and a 5 minute cool-down routine.   Nutrition Goals: Your personal nutrition goals will be established when you do your nutrition analysis with the dietician.  The following are general nutrition guidelines to follow: Cholesterol < 200mg /day Sodium <  1500mg /day Fiber: Men over 50 yrs - 30 grams per day  Personal Goals:  Personal Goals and Risk Factors at Admission - 05/11/24 1312       Core Components/Risk Factors/Patient Goals on Admission   Improve shortness of breath with ADL's Yes    Intervention Provide education, individualized exercise plan and daily activity instruction to help decrease symptoms of SOB with activities of daily living.    Expected Outcomes Short Term: Improve cardiorespiratory fitness to achieve a reduction of symptoms when performing ADLs;Long Term: Be able to perform more ADLs without symptoms or delay the onset of symptoms    Increase knowledge of respiratory medications and ability to use respiratory devices properly  Yes    Intervention Provide education and demonstration as needed of appropriate use of medications, inhalers, and oxygen  therapy.    Expected Outcomes Long Term: Maintain appropriate use of medications, inhalers, and oxygen  therapy.;Short Term: Achieves understanding of medications use. Understands that oxygen  is a medication prescribed by physician. Demonstrates appropriate use of inhaler and oxygen  therapy.    Heart Failure Yes    Intervention Provide a combined exercise and nutrition program that is supplemented with education, support and counseling about heart failure. Directed toward relieving symptoms such as  shortness of breath, decreased exercise tolerance, and extremity edema.    Expected Outcomes Improve functional capacity of life;Short term: Attendance in program 2-3 days a week with increased exercise capacity. Reported lower sodium intake. Reported increased fruit and vegetable intake. Reports medication compliance.;Short term: Daily weights obtained and reported for increase. Utilizing diuretic protocols set by physician.;Long term: Adoption of self-care skills and reduction of barriers for early signs and symptoms recognition and intervention leading to self-care maintenance.     Hypertension Yes    Intervention Provide education on lifestyle modifcations including regular physical activity/exercise, weight management, moderate sodium restriction and increased consumption of fresh fruit, vegetables, and low fat dairy, alcohol moderation, and smoking cessation.;Monitor prescription use compliance.    Expected Outcomes Short Term: Continued assessment and intervention until BP is < 140/25mm HG in hypertensive participants. < 130/59mm HG in hypertensive participants with diabetes, heart failure or chronic kidney disease.;Long Term: Maintenance of blood pressure at goal levels.    Lipids Yes    Intervention Provide education and support for participant on nutrition & aerobic/resistive exercise along with prescribed medications to achieve LDL 70mg , HDL >40mg .    Expected Outcomes Long Term: Cholesterol controlled with medications as prescribed, with individualized exercise RX and with personalized nutrition plan. Value goals: LDL < 70mg , HDL > 40 mg.;Short Term: Participant states understanding of desired cholesterol values and is compliant with medications prescribed. Participant is following exercise prescription and nutrition guidelines.          Tobacco Use Initial Evaluation: Social History   Tobacco Use  Smoking Status Former   Current packs/day: 0.00   Average packs/day: 1 pack/day for 38.0 years (38.0 ttl pk-yrs)   Types: Cigarettes   Start date: 02/20/1982   Quit date: 02/21/2020   Years since quitting: 4.2  Smokeless Tobacco Never    Exercise Goals and Review:  Exercise Goals     Row Name 05/11/24 1309             Exercise Goals   Increase Physical Activity Yes       Intervention Provide advice, education, support and counseling about physical activity/exercise needs.;Develop an individualized exercise prescription for aerobic and resistive training based on initial evaluation findings, risk stratification, comorbidities and participant's personal  goals.       Expected Outcomes Short Term: Attend rehab on a regular basis to increase amount of physical activity.;Long Term: Add in home exercise to make exercise part of routine and to increase amount of physical activity.;Long Term: Exercising regularly at least 3-5 days a week.       Increase Strength and Stamina Yes       Intervention Provide advice, education, support and counseling about physical activity/exercise needs.;Develop an individualized exercise prescription for aerobic and resistive training based on initial evaluation findings, risk stratification, comorbidities and participant's personal goals.       Expected Outcomes Short Term: Increase workloads from initial exercise prescription for resistance, speed, and METs.;Short Term: Perform resistance training exercises routinely during rehab and add in resistance training at home;Long Term: Improve cardiorespiratory fitness, muscular endurance and strength as measured by increased METs and functional capacity ( )       Able to understand and use rate of perceived exertion (RPE) scale Yes       Intervention Provide education and explanation on how to use RPE scale       Expected Outcomes Short Term: Able to use RPE daily in rehab to express subjective intensity level;Long Term:  Able to  use RPE to guide intensity level when exercising independently       Able to understand and use Dyspnea scale Yes       Intervention Provide education and explanation on how to use Dyspnea scale       Expected Outcomes Short Term: Able to use Dyspnea scale daily in rehab to express subjective sense of shortness of breath during exertion;Long Term: Able to use Dyspnea scale to guide intensity level when exercising independently       Knowledge and understanding of Target Heart Rate Range (THRR) Yes       Intervention Provide education and explanation of THRR including how the numbers were predicted and where they are located for reference       Expected  Outcomes Short Term: Able to state/look up THRR;Short Term: Able to use daily as guideline for intensity in rehab;Long Term: Able to use THRR to govern intensity when exercising independently       Able to check pulse independently Yes       Intervention Provide education and demonstration on how to check pulse in carotid and radial arteries.;Review the importance of being able to check your own pulse for safety during independent exercise       Expected Outcomes Short Term: Able to explain why pulse checking is important during independent exercise;Long Term: Able to check pulse independently and accurately       Understanding of Exercise Prescription Yes       Intervention Provide education, explanation, and written materials on patient's individual exercise prescription       Expected Outcomes Short Term: Able to explain program exercise prescription;Long Term: Able to explain home exercise prescription to exercise independently          Copy of goals given to participant.

## 2024-05-18 NOTE — Telephone Encounter (Signed)
 Yves from [facility] callaed back checking status of fxed sent on July 10 about new med recommendation

## 2024-05-19 ENCOUNTER — Encounter (HOSPITAL_COMMUNITY): Payer: Self-pay | Admitting: *Deleted

## 2024-05-19 DIAGNOSIS — J4489 Other specified chronic obstructive pulmonary disease: Secondary | ICD-10-CM

## 2024-05-19 NOTE — Telephone Encounter (Signed)
 No call back number noted.

## 2024-05-19 NOTE — Progress Notes (Signed)
 Pulmonary Individual Treatment Plan  Patient Details  Name: Ricardo Burch MRN: [id number] Date of Birth: 19-Aug-1961 Referring Provider:   Flowsheet Row PULMONARY REHAB OTHER RESP ORIENTATION from 05/17/2024 in [facility] CARDIAC REHABILITATION  Referring Provider Theophilus Roosevelt MD    Initial Encounter Date:  Flowsheet Row PULMONARY REHAB OTHER RESP ORIENTATION from 05/17/2024 in [facility]  Date 05/17/24    Visit Diagnosis: COPD with chronic bronchitis and emphysema (HCC)  Patient's Home Medications on Admission:   Current Outpatient Medications:    albuterol  (VENTOLIN  HFA) 108 (90 Base) MCG/ACT inhaler, INHALE 1-2 PUFFS BY MOUTH EVERY 6 HOURS AS NEEDED FOR WHEEZING AND SHORTNESS OF BREATH, Disp: 8.5 g, Rfl: 11   amLODipine  (NORVASC ) 5 MG tablet, TAKE 1 TABLET BY MOUTH DAILY, Disp: 30 tablet, Rfl: 11   fluticasone  (FLONASE ) 50 MCG/ACT nasal spray, INSTILL TWO (2) SPRAYS IN EACH NOSTRIL TWICE DAILY, Disp: 16 g, Rfl: 11   furosemide  (LASIX ) 20 MG tablet, Take 20 mg by mouth daily., Disp: , Rfl:    OXYGEN , Inhale 3 L into the lungs daily. continuous, Disp: , Rfl:    OZEMPIC, 1 MG/DOSE, 4 MG/3ML SOPN, SMARTSIG:0.75 Milliliter(s) SUB-Q Once a Week, Disp: , Rfl:    roflumilast  (DALIRESP ) 500 MCG TABS tablet, TAKE 1 TABLET ( ) BY MOUTH DAILY, Disp: 30 tablet, Rfl: 11   sildenafil  (VIAGRA ) 50 MG tablet, Take 1 tablet (50 mg total) by mouth daily as needed for erectile dysfunction. (Patient not taking: Reported on 05/11/2024), Disp: 30 tablet, Rfl: 1   spironolactone (ALDACTONE) 25 MG tablet, Take 25 mg by mouth daily., Disp: , Rfl:    TRELEGY ELLIPTA  200-62.5-25 MCG/ACT AEPB, INHALE ONE (1) PUFF BY MOUTH DAILY, Disp: 60 each, Rfl: 10  Past Medical History: Past Medical History:  Diagnosis Date   Allergy    CHF (congestive heart failure) (HCC)    HFpEF   Chronic kidney disease    COPD (chronic obstructive pulmonary disease) (HCC) Dx 2015   Depression    Eczema     since childhood    Emphysema of lung (HCC)    Erectile dysfunction 08/18/2019   GERD (gastroesophageal reflux disease) 01/25/2020   Hyperlipidemia 01/12/2018   Hypertension    Oxygen  deficiency    Screening for HIV (human immunodeficiency virus)    Sleep apnea    CPAP   Substance abuse (HCC)    MJ, cocaine   Testicular failure 07/22/2019    Tobacco Use: Social History   Tobacco Use  Smoking Status Former   Current packs/day: 0.00   Average packs/day: 1 pack/day for 38.0 years (38.0 ttl pk-yrs)   Types: Cigarettes   Start date: 02/20/1982   Quit date: 02/21/2020   Years since quitting: 4.2  Smokeless Tobacco Never    Labs: Review Flowsheet  More data exists      Latest Ref Rng & Units 10/25/2020 05/16/2022 11/20/2022 05/19/2023 01/30/2024  Labs for ITP Cardiac and Pulmonary Rehab  Cholestrol 100 - 199 mg/dL 842  843  839  849  837   LDL (calc) 0 - 99 mg/dL 86  94  99  87  85   HDL-C >39 mg/dL 56  51  47  52  65   Trlycerides 0 - 149 mg/dL 64  52  70  49  60   Hemoglobin A1c 4.8 - 5.6 % 5.8  5.7  5.6  5.7  5.6     Capillary Blood Glucose: Lab Results  Component Value Date  GLUCAP 85 05/12/2024   GLUCAP 84 11/20/2019   GLUCAP 93 05/12/2018   GLUCAP 179 (H) 07/27/2016   GLUCAP 156 (H) 07/27/2016     Pulmonary Assessment Scores:  Pulmonary Assessment Scores     Row Name 05/11/24 1319         ADL UCSD   ADL Phase Entry     SOB Score total 39     Rest 0     Walk 3     Stairs 3     Bath 2     Dress 2     Shop 2       CAT Score   CAT Score 14       UCSD: Self-administered rating of dyspnea associated with activities of daily living (ADLs) 6-point scale (0 = not at all to 5 = maximal or unable to do because of breathlessness)  Scoring Scores range from 0 to 120.  Minimally important difference is 5 units  CAT: CAT can identify the health impairment of COPD patients and is better correlated with disease progression.  CAT has a scoring range of zero  to 40. The CAT score is classified into four groups of low (less than 10), medium (10 - 20), high (21-30) and very high (31-40) based on the impact level of disease on health status. A CAT score over 10 suggests significant symptoms.  A worsening CAT score could be explained by an exacerbation, poor medication adherence, poor inhaler technique, or progression of COPD or comorbid conditions.  CAT MCID is 2 points  mMRC: mMRC (Modified Medical Research Council) Dyspnea Scale is used to assess the degree of baseline functional disability in patients of respiratory disease due to dyspnea. No minimal important difference is established. A decrease in score of 1 point or greater is considered a positive change.   Pulmonary Function Assessment:  Pulmonary Function Assessment - 05/11/24 1311       Pulmonary Function Tests   FVC% 56 %    FEV1% 37 %    FEV1/FVC Ratio 61    DLCO% 50 %          Exercise Target Goals: Exercise Program Goal: Individual exercise prescription set using results from initial 6 min walk test and THRR while considering  patient's activity barriers and safety.   Exercise Prescription Goal: Initial exercise prescription builds to 30-45 minutes a day of aerobic activity, 2-3 days per week.  Home exercise guidelines will be given to patient during program as part of exercise prescription that the participant will acknowledge.  Activity Barriers & Risk Stratification:  Activity Barriers & Cardiac Risk Stratification - 05/11/24 1304       Activity Barriers & Cardiac Risk Stratification   Activity Barriers Muscular Weakness;Deconditioning;Shortness of Breath          6 Minute Walk:  6 Minute Walk     Row Name 05/17/24 1052         6 Minute Walk   Phase Initial     Distance 570 feet     Walk Time 3.46 minutes     # of Rest Breaks 0     MPH 1.87     METS 1.8     RPE 13     Perceived Dyspnea  3     VO2 Peak 6.3     Symptoms Yes (comment)     Comments pt o2  dropped 74 at 3.46 min test stopped     Resting HR 74 bpm  Resting BP 112/78     Resting Oxygen  Saturation  90 %     Exercise Oxygen  Saturation  during 6 min walk 74 %     Max Ex. HR 93 bpm     Max Ex. BP 150/70     2 Minute Post BP 126/70       Interval HR   1 Minute HR 91     2 Minute HR 87     3 Minute HR 93     6 Minute HR 74     2 Minute Post HR 71     Interval Heart Rate? Yes       Interval Oxygen    Interval Oxygen ? Yes     Baseline Oxygen  Saturation % 90 %     1 Minute Oxygen  Saturation % 90 %     1 Minute Liters of Oxygen  0 L     2 Minute Oxygen  Saturation % 86 %     2 Minute Liters of Oxygen  0 L     3 Minute Oxygen  Saturation % 74 %     3 Minute Liters of Oxygen  0 L     6 Minute Oxygen  Saturation % 87 %     6 Minute Liters of Oxygen  0 L     2 Minute Post Oxygen  Saturation % 88 %     2 Minute Post Liters of Oxygen  0 L        Oxygen  Initial Assessment:  Oxygen  Initial Assessment - 05/11/24 1310       Home Oxygen    Home Oxygen  Device Portable Concentrator;Home Concentrator    Sleep Oxygen  Prescription CPAP    Liters per minute 3    Home Exercise Oxygen  Prescription Continuous    Liters per minute 3    Home Resting Oxygen  Prescription Continuous    Liters per minute 3    Compliance with Home Oxygen  Use Yes      Intervention   Short Term Goals To learn and exhibit compliance with exercise, home and travel O2 prescription;To learn and understand importance of monitoring SPO2 with pulse oximeter and demonstrate accurate use of the pulse oximeter.;To learn and understand importance of maintaining oxygen  saturations>88%;To learn and demonstrate proper pursed lip breathing techniques or other breathing techniques. ;To learn and demonstrate proper use of respiratory medications    Long  Term Goals Exhibits compliance with exercise, home  and travel O2 prescription;Verbalizes importance of monitoring SPO2 with pulse oximeter and return demonstration;Maintenance of O2  saturations>88%;Exhibits proper breathing techniques, such as pursed lip breathing or other method taught during program session;Compliance with respiratory medication;Demonstrates proper use of MDI's          Oxygen  Re-Evaluation:   Oxygen  Discharge (Final Oxygen  Re-Evaluation):   Initial Exercise Prescription:  Initial Exercise Prescription - 05/17/24 1000       Date of Initial Exercise RX and Referring Provider   Date 05/17/24    Referring Provider Mannam, Praveen MD      Oxygen    Oxygen  Continuous    Liters 3    Maintain Oxygen  Saturation 88% or higher      Treadmill   MPH 1.2    Grade 0    Minutes 15    METs 1.92      REL-XR   Level 1    Speed 50    Minutes 15    METs 1.9      Prescription Details   Frequency (times per week) 2    Duration  Progress to 30 minutes of continuous aerobic without signs/symptoms of physical distress      Intensity   THRR 40-80% of Max Heartrate 108-143    Ratings of Perceived Exertion 11-13    Perceived Dyspnea 0-4      Resistance Training   Training Prescription Yes    Weight 7    Reps 10-15          Perform Capillary Blood Glucose checks as needed.  Exercise Prescription Changes:   Exercise Prescription Changes     Row Name 05/17/24 1100             Response to Exercise   Blood Pressure (Admit) 112/78       Blood Pressure (Exercise) 150/70       Blood Pressure (Exit) 126/70       Heart Rate (Admit) 74 bpm       Heart Rate (Exercise) 93 bpm       Heart Rate (Exit) 71 bpm       Oxygen  Saturation (Admit) 90 %       Oxygen  Saturation (Exercise) 74 %       Oxygen  Saturation (Exit) 88 %       Rating of Perceived Exertion (Exercise) 13       Perceived Dyspnea (Exercise) 3       Duration Progress to 30 minutes of  aerobic without signs/symptoms of physical distress       Intensity THRR unchanged          Exercise Comments:   Exercise Comments     Row Name 05/11/24 1310 05/17/24 1029         Exercise  Comments Ricardo Burch currently doesn't do any home exercise besides ADL's and walking his dog. Patient attend orientation today.  Patient is attending Pulmonary Rehabilitation Program.  Documentation for diagnosis can be found in [facility].  Reviewed medical chart, RPE/RPD, gym safety, and program guidelines.  Patient was fitted to equipment they will be using during rehab.  Patient is scheduled to start exercise on 06/08/24.   Initial ITP created and sent for review and signature by Dr. Anton Kelp, Medical Director for Pulmonary Rehabilitation Program.         Exercise Goals and Review:   Exercise Goals     Row Name 05/11/24 1309             Exercise Goals   Increase Physical Activity Yes       Intervention Provide advice, education, support and counseling about physical activity/exercise needs.;Develop an individualized exercise prescription for aerobic and resistive training based on initial evaluation findings, risk stratification, comorbidities and participant's personal goals.       Expected Outcomes Short Term: Attend rehab on a regular basis to increase amount of physical activity.;Long Term: Add in home exercise to make exercise part of routine and to increase amount of physical activity.;Long Term: Exercising regularly at least 3-5 days a week.       Increase Strength and Stamina Yes       Intervention Provide advice, education, support and counseling about physical activity/exercise needs.;Develop an individualized exercise prescription for aerobic and resistive training based on initial evaluation findings, risk stratification, comorbidities and participant's personal goals.       Expected Outcomes Short Term: Increase workloads from initial exercise prescription for resistance, speed, and METs.;Short Term: Perform resistance training exercises routinely during rehab and add in resistance training at home;Long Term: Improve cardiorespiratory fitness, muscular endurance and strength as measured  by increased METs and functional capacity ( )       Able to understand and use rate of perceived exertion (RPE) scale Yes       Intervention Provide education and explanation on how to use RPE scale       Expected Outcomes Short Term: Able to use RPE daily in rehab to express subjective intensity level;Long Term:  Able to use RPE to guide intensity level when exercising independently       Able to understand and use Dyspnea scale Yes       Intervention Provide education and explanation on how to use Dyspnea scale       Expected Outcomes Short Term: Able to use Dyspnea scale daily in rehab to express subjective sense of shortness of breath during exertion;Long Term: Able to use Dyspnea scale to guide intensity level when exercising independently       Knowledge and understanding of Target Heart Rate Range (THRR) Yes       Intervention Provide education and explanation of THRR including how the numbers were predicted and where they are located for reference       Expected Outcomes Short Term: Able to state/look up THRR;Short Term: Able to use daily as guideline for intensity in rehab;Long Term: Able to use THRR to govern intensity when exercising independently       Able to check pulse independently Yes       Intervention Provide education and demonstration on how to check pulse in carotid and radial arteries.;Review the importance of being able to check your own pulse for safety during independent exercise       Expected Outcomes Short Term: Able to explain why pulse checking is important during independent exercise;Long Term: Able to check pulse independently and accurately       Understanding of Exercise Prescription Yes       Intervention Provide education, explanation, and written materials on patient's individual exercise prescription       Expected Outcomes Short Term: Able to explain program exercise prescription;Long Term: Able to explain home exercise prescription to exercise independently           Exercise Goals Re-Evaluation :   Discharge Exercise Prescription (Final Exercise Prescription Changes):  Exercise Prescription Changes - 05/17/24 1100       Response to Exercise   Blood Pressure (Admit) 112/78    Blood Pressure (Exercise) 150/70    Blood Pressure (Exit) 126/70    Heart Rate (Admit) 74 bpm    Heart Rate (Exercise) 93 bpm    Heart Rate (Exit) 71 bpm    Oxygen  Saturation (Admit) 90 %    Oxygen  Saturation (Exercise) 74 %    Oxygen  Saturation (Exit) 88 %    Rating of Perceived Exertion (Exercise) 13    Perceived Dyspnea (Exercise) 3    Duration Progress to 30 minutes of  aerobic without signs/symptoms of physical distress    Intensity THRR unchanged          Nutrition:  Target Goals: Understanding of nutrition guidelines, daily intake of sodium 1500mg , cholesterol 200mg , calories 30% from fat and 7% or less from saturated fats, daily to have 5 or more servings of fruits and vegetables.  Biometrics:  Pre Biometrics - 05/17/24 1102       Pre Biometrics   Height 5' 9 (1.753 m)    Weight 238 lb 1.6 oz (108 kg)    Waist Circumference 47 inches    Hip Circumference 42 inches  Waist to Hip Ratio 1.12 %    BMI (Calculated) 35.14    Grip Strength 44.2 kg           Nutrition Therapy Plan and Nutrition Goals:  Nutrition Therapy & Goals - 05/11/24 1313       Intervention Plan   Intervention Prescribe, educate and counsel regarding individualized specific dietary modifications aiming towards targeted core components such as weight, hypertension, lipid management, diabetes, heart failure and other comorbidities.;Nutrition handout(s) given to patient.    Expected Outcomes Short Term Goal: Understand basic principles of dietary content, such as calories, fat, sodium, cholesterol and nutrients.;Short Term Goal: A plan has been developed with personal nutrition goals set during dietitian appointment.;Long Term Goal: Adherence to prescribed nutrition  plan.          Nutrition Assessments:  MEDIFICTS Score Key: >=70 Need to make dietary changes  40-70 Heart Healthy Diet <= 40 Therapeutic Level Cholesterol Diet  Flowsheet Row PULMONARY VIRTUAL BASED CARE from 05/11/2024 in [facility] CARDIAC REHABILITATION  Picture Your Plate Total Score on Admission 49   Picture Your Plate Scores: <59 Unhealthy dietary pattern with much room for improvement. 41-50 Dietary pattern unlikely to meet recommendations for good health and room for improvement. 51-60 More healthful dietary pattern, with some room for improvement.  >60 Healthy dietary pattern, although there may be some specific behaviors that could be improved.    Nutrition Goals Re-Evaluation:   Nutrition Goals Discharge (Final Nutrition Goals Re-Evaluation):   Psychosocial: Target Goals: Acknowledge presence or absence of significant depression and/or stress, maximize coping skills, provide positive support system. Participant is able to verbalize types and ability to use techniques and skills needed for reducing stress and depression.  Initial Review & Psychosocial Screening:  Initial Psych Review & Screening - 05/11/24 1313       Initial Review   Current issues with None Identified      Family Dynamics   Good Support System? Yes    Comments Ricardo Burch didn't specify who his support system was, but he states he has people to help him if needed.      Barriers   Psychosocial barriers to participate in program There are no identifiable barriers or psychosocial needs.      Screening Interventions   Interventions Encouraged to exercise    Expected Outcomes Short Term goal: Identification and review with participant of any Quality of Life or Depression concerns found by scoring the questionnaire.;Long Term goal: The participant improves quality of Life and PHQ9 Scores as seen by post scores and/or verbalization of changes;Long Term Goal: Stressors or current issues are controlled or  eliminated.;Short Term goal: Utilizing psychosocial counselor, staff and physician to assist with identification of specific Stressors or current issues interfering with healing process. Setting desired goal for each stressor or current issue identified.          Quality of Life Scores:  Scores of 19 and below usually indicate a poorer quality of life in these areas.  A difference of  2-3 points is a clinically meaningful difference.  A difference of 2-3 points in the total score of the Quality of Life Index has been associated with significant improvement in overall quality of life, self-image, physical symptoms, and general health in studies assessing change in quality of life.   PHQ-9: Review Flowsheet  More data exists      05/17/2024 02/11/2024 01/20/2024 01/13/2024 01/06/2024  Depression screen PHQ 2/9  Decreased Interest 0 1 0 0 0 0  Down, Depressed, Hopeless 0 1 0 0 0 0  PHQ - 2 Score 0 2 0 0 0 0  Altered sleeping 0 1 0 0 0 0  Tired, decreased energy 1 2 0 0 0 0  Change in appetite 0 0 0 0 0 0  Feeling bad or failure about yourself  0 1 0 0 0 0  Trouble concentrating 0 0 0 0 0 0  Moving slowly or fidgety/restless 1 2 0 0 0 0  Suicidal thoughts 0 0 0 0 0 0  PHQ-9 Score 2 8 0 0 0 0  Difficult doing work/chores Not difficult at all Somewhat difficult Not difficult at all Not difficult at all Not difficult at all Not difficult at all    Details       Multiple values from one day are sorted in reverse-chronological order        Interpretation of Total Score  Total Score Depression Severity:  1-4 = Minimal depression, 5-9 = Mild depression, 10-14 = Moderate depression, 15-19 = Moderately severe depression, 20-27 = Severe depression   Psychosocial Evaluation and Intervention:  Psychosocial Evaluation - 05/11/24 1314       Psychosocial Evaluation & Interventions   Interventions Encouraged to exercise with the program and follow exercise prescription    Comments Ricardo Burch is a  pleasant gentleman who is coming into rehab for COPD with chronic bronchitis and emphysema. He has been in the program before but it was for cardiac reasoning.  Ricardo Burch states  that he gets around well and is independent with his ADL's. He does not currently do any home exercise besides ADL's and walking his dog outside. He is on 3L oxygen  as needed, and a CPAP at night with 3L. He is having a colonscopy tomorrow and is not very happy about that, but he is eager to start back our program starting Monday the 14th.    Expected Outcomes Short: Increase strength and stamina. Long: Get off oxygen  or be able to wean down eventually.    Continue Psychosocial Services  Follow up required by staff          Psychosocial Re-Evaluation:   Psychosocial Discharge (Final Psychosocial Re-Evaluation):    Education: Education Goals: Education classes will be provided on a weekly basis, covering required topics. Participant will state understanding/return demonstration of topics presented.  Learning Barriers/Preferences:  Learning Barriers/Preferences - 05/11/24 1313       Learning Barriers/Preferences   Learning Barriers None    Learning Preferences Video;Written Material;Pictoral          Education Topics: How Lungs Work and Diseases: - Discuss the anatomy of the lungs and diseases that can affect the lungs, such as COPD. Flowsheet Row PULMONARY REHAB OTHER RESPIRATORY from 06/08/2020 in [facility] [facility]  Date 03/12/18  Educator D. Coad  Instruction Review Code 2- Demonstrated Understanding    Exercise: -Discuss the importance of exercise, FITT principles of exercise, normal and abnormal responses to exercise, and how to exercise safely. Flowsheet Row PULMONARY REHAB OTHER RESPIRATORY from 06/08/2020 in [place] [facility]  Date 03/05/18  Educator GC  Instruction Review Code 2- Demonstrated Understanding    Environmental Irritants: -Discuss types of  environmental irritants and how to limit exposure to environmental irritants. Flowsheet Row PULMONARY REHAB OTHER RESPIRATORY from 06/08/2020 in [place] [facility]  Date 05/18/20  Educator CHARM Louder  Instruction Review Code 1- Verbalizes Understanding    Meds/Inhalers and oxygen : - Discuss respiratory medications, definition of an  inhaler and oxygen , and the proper way to use an inhaler and oxygen . Flowsheet Row PULMONARY REHAB OTHER RESPIRATORY from 06/08/2020 in [place] [facility]  Date 03/26/18  Educator DC    Energy Saving Techniques: - Discuss methods to conserve energy and decrease shortness of breath when performing activities of daily living.  Flowsheet Row PULMONARY REHAB OTHER RESPIRATORY from 06/08/2020 in [place] [facility]  Date 04/02/18  Educator GC  Instruction Review Code 2- Demonstrated Understanding    Bronchial Hygiene / Breathing Techniques: - Discuss breathing mechanics, pursed-lip breathing technique,  proper posture, effective ways to clear airways, and other functional breathing techniques Flowsheet Row PULMONARY REHAB OTHER RESPIRATORY from 06/08/2020 in [place] [facility]  Date 04/09/18  Educator DC  Instruction Review Code 2- Demonstrated Understanding    Cleaning Equipment: - Provides group verbal and written instruction about the health risks of elevated stress, cause of high stress, and healthy ways to reduce stress. Flowsheet Row PULMONARY REHAB OTHER RESPIRATORY from 06/08/2020 in [place] [facility]  Date 01/15/18  Educator GC  Instruction Review Code 2- Demonstrated Understanding    Nutrition I: Fats: - Discuss the types of cholesterol, what cholesterol does to the body, and how cholesterol levels can be controlled. Flowsheet Row PULMONARY REHAB OTHER RESPIRATORY from 06/08/2020 in [place] [facility]  Date 01/22/18  Educator GC  Instruction Review Code 2-  Demonstrated Understanding    Nutrition II: Labels: -Discuss the different components of food labels and how to read food labels. Flowsheet Row PULMONARY REHAB OTHER RESPIRATORY from 06/08/2020 in [place] [facility]  Date 01/29/18  Educator DJ  Instruction Review Code 2- Demonstrated Understanding    Respiratory Infections: - Discuss the signs and symptoms of respiratory infections, ways to prevent respiratory infections, and the importance of seeking medical treatment when having a respiratory infection. Flowsheet Row PULMONARY REHAB OTHER RESPIRATORY from 06/08/2020 in [place] [facility]  Date 02/05/18  Educator GC  Instruction Review Code 2- Demonstrated Understanding    Stress I: Signs and Symptoms: - Discuss the causes of stress, how stress may lead to anxiety and depression, and ways to limit stress.   Stress II: Relaxation: -Discuss relaxation techniques to limit stress. Flowsheet Row PULMONARY REHAB OTHER RESPIRATORY from 06/08/2020 in [facility]  Date 04/20/20  Educator DF  Instruction Review Code 2- Demonstrated Understanding    Oxygen  for Home/Travel: - Discuss how to prepare for travel when on oxygen  and proper ways to transport and store oxygen  to ensure safety.   Knowledge Questionnaire Score:  Knowledge Questionnaire Score - 05/11/24 1324       Knowledge Questionnaire Score   Pre Score 15/18          Core Components/Risk Factors/Patient Goals at Admission:  Personal Goals and Risk Factors at Admission - 05/11/24 1312       Core Components/Risk Factors/Patient Goals on Admission   Improve shortness of breath with ADL's Yes    Intervention Provide education, individualized exercise plan and daily activity instruction to help decrease symptoms of SOB with activities of daily living.    Expected Outcomes Short Term: Improve cardiorespiratory fitness to achieve a reduction of symptoms when performing  ADLs;Long Term: Be able to perform more ADLs without symptoms or delay the onset of symptoms    Increase knowledge of respiratory medications and ability to use respiratory devices properly  Yes    Intervention Provide education and demonstration as needed of appropriate use of medications,  inhalers, and oxygen  therapy.    Expected Outcomes Long Term: Maintain appropriate use of medications, inhalers, and oxygen  therapy.;Short Term: Achieves understanding of medications use. Understands that oxygen  is a medication prescribed by physician. Demonstrates appropriate use of inhaler and oxygen  therapy.    Heart Failure Yes    Intervention Provide a combined exercise and nutrition program that is supplemented with education, support and counseling about heart failure. Directed toward relieving symptoms such as shortness of breath, decreased exercise tolerance, and extremity edema.    Expected Outcomes Improve functional capacity of life;Short term: Attendance in program 2-3 days a week with increased exercise capacity. Reported lower sodium intake. Reported increased fruit and vegetable intake. Reports medication compliance.;Short term: Daily weights obtained and reported for increase. Utilizing diuretic protocols set by physician.;Long term: Adoption of self-care skills and reduction of barriers for early signs and symptoms recognition and intervention leading to self-care maintenance.    Hypertension Yes    Intervention Provide education on lifestyle modifcations including regular physical activity/exercise, weight management, moderate sodium restriction and increased consumption of fresh fruit, vegetables, and low fat dairy, alcohol moderation, and smoking cessation.;Monitor prescription use compliance.    Expected Outcomes Short Term: Continued assessment and intervention until BP is < 140/62mm HG in hypertensive participants. < 130/96mm HG in hypertensive participants with diabetes, heart failure or chronic  kidney disease.;Long Term: Maintenance of blood pressure at goal levels.    Lipids Yes    Intervention Provide education and support for participant on nutrition & aerobic/resistive exercise along with prescribed medications to achieve LDL 70mg , HDL >40mg .    Expected Outcomes Long Term: Cholesterol controlled with medications as prescribed, with individualized exercise RX and with personalized nutrition plan. Value goals: LDL < 70mg , HDL > 40 mg.;Short Term: Participant states understanding of desired cholesterol values and is compliant with medications prescribed. Participant is following exercise prescription and nutrition guidelines.          Core Components/Risk Factors/Patient Goals Review:    Core Components/Risk Factors/Patient Goals at Discharge (Final Review):    ITP Comments:  ITP Comments     Row Name 05/11/24 1308 05/17/24 1029 05/19/24 0925       ITP Comments Completed virtual orientation today.  EP evaluation is scheduled for 05/17/24 at 1000 .  Documentation for diagnosis can be found in [facility] encounter 04/28/24. Patient attend orientation today.  Patient is attending Pulmonary Rehabilitation Program.  Documentation for diagnosis can be found in [facility].  Reviewed medical chart, RPE/RPD, gym safety, and program guidelines.  Patient was fitted to equipment they will be using during rehab.  Patient is scheduled to start exercise on 06/08/24.   Initial ITP created and sent for review and signature by Dr. Anton Kelp, Medical Director for Pulmonary Rehabilitation Program. 30 day review completed. ITP sent to Dr.Jehanzeb Memon, Medical Director of  Pulmonary Rehab. Continue with ITP unless changes are made by physician.  New to program, only attended orientation thus far.        Comments: 30 day review

## 2024-05-31 DIAGNOSIS — G4733 Obstructive sleep apnea (adult) (pediatric): Secondary | ICD-10-CM | POA: Diagnosis not present

## 2024-05-31 DIAGNOSIS — J449 Chronic obstructive pulmonary disease, unspecified: Secondary | ICD-10-CM | POA: Diagnosis not present

## 2024-06-05 DIAGNOSIS — J449 Chronic obstructive pulmonary disease, unspecified: Secondary | ICD-10-CM | POA: Diagnosis not present

## 2024-06-08 ENCOUNTER — Encounter (HOSPITAL_COMMUNITY)
Admission: RE | Admit: 2024-06-08 | Discharge: 2024-06-08 | Disposition: A | Discharge status: Home / Self Care | Source: Ambulatory Visit | Attending: Pulmonary Disease | Admitting: Pulmonary Disease

## 2024-06-08 DIAGNOSIS — J439 Emphysema, unspecified: Secondary | ICD-10-CM | POA: Diagnosis not present

## 2024-06-08 DIAGNOSIS — J4489 Other specified chronic obstructive pulmonary disease: Secondary | ICD-10-CM | POA: Insufficient documentation

## 2024-06-08 NOTE — Progress Notes (Signed)
 Daily Session Note  Patient Details  Name: Ricardo Burch MRN: [id number] Date of Birth: 1960-11-24 Referring Provider:   Flowsheet Row PULMONARY REHAB OTHER RESP ORIENTATION from 05/17/2024 in [facility]  Referring Provider Theophilus Roosevelt MD    Encounter Date: 06/08/2024  Check In:  Session Check In - 06/08/24 1030       Check-In   Supervising physician immediately available to respond to emergencies See telemetry face sheet for immediately available MD    Location [facility]    Staff Present Laymon Rattler, BSN, RN, WTA-C;Daysy Santini Con, BS, Exercise Physiologist;Jessica Vonzell, MA, RCEP, CCRP, CCET    Virtual Visit No    Medication changes reported     No    Fall or balance concerns reported    No    Tobacco Cessation No Change    Warm-up and Cool-down Performed on first and last piece of equipment    Resistance Training Performed Yes    VAD Patient? No    PAD/SET Patient? No      Pain Assessment   Currently in Pain? No/denies    Multiple Pain Sites No          Capillary Blood Glucose: No results found for this or any previous visit (from the past 24 hours).    Social History   Tobacco Use  Smoking Status Former   Current packs/day: 0.00   Average packs/day: 1 pack/day for 38.0 years (38.0 ttl pk-yrs)   Types: Cigarettes   Start date: 02/20/1982   Quit date: 02/21/2020   Years since quitting: 4.2  Smokeless Tobacco Never    Goals Met:  Independence with exercise equipment Exercise tolerated well No report of concerns or symptoms today Strength training completed today  Goals Unmet:  Not Applicable  Comments: First full day of exercise!  Patient was oriented to gym and equipment including functions, settings, policies, and procedures.  Patient's individual exercise prescription and treatment plan were reviewed.  All starting workloads were established based on the results of the 6 minute walk test done at initial  orientation visit.  The plan for exercise progression was also introduced and progression will be customized based on patient's performance and goals.

## 2024-06-10 ENCOUNTER — Encounter (HOSPITAL_COMMUNITY): Admission: RE | Admit: 2024-06-10 | Source: Ambulatory Visit

## 2024-06-10 ENCOUNTER — Telehealth (HOSPITAL_COMMUNITY): Payer: Self-pay | Admitting: *Deleted

## 2024-06-10 NOTE — Telephone Encounter (Signed)
 Called to check on patient when he did not show for class.  Left message.

## 2024-06-15 ENCOUNTER — Encounter (HOSPITAL_COMMUNITY)
Admission: RE | Admit: 2024-06-15 | Discharge: 2024-06-15 | Disposition: A | Discharge status: Home / Self Care | Source: Ambulatory Visit | Attending: Pulmonary Disease

## 2024-06-15 DIAGNOSIS — J4489 Other specified chronic obstructive pulmonary disease: Secondary | ICD-10-CM | POA: Diagnosis not present

## 2024-06-15 DIAGNOSIS — J439 Emphysema, unspecified: Secondary | ICD-10-CM | POA: Diagnosis not present

## 2024-06-15 NOTE — Progress Notes (Signed)
 Daily Session Note  Patient Details  Name: Tyrek Lawhorn MRN: [id number] Date of Birth: [birth year] Referring Provider:   Flowsheet Row PULMONARY REHAB OTHER RESP ORIENTATION from 05/17/2024 in [facility] C[facility]AC RE[facility]  Referring Provider Theophilus Roosevelt MD    Encounter Date: 06/15/2024  Check In:  Session Check In - 06/15/24 1040       Check-In   Supervising physician immediately available to respond to emergencies See telemetry face sheet for immediately available MD    Location [facility]    Staff Present Powell Benders, BS, Exercise Physiologist;Brittany Jackquline, BSN, RN, WTA-C;Ajee Heasley Zina, RN;Laureen Delores, BS, RRT, CPFT    Virtual Visit No    Medication changes reported     No    Fall or balance concerns reported    No    Warm-up and Cool-down Performed on first and last piece of equipment    Resistance Training Performed Yes    VAD Patient? No    PAD/SET Patient? No      Pain Assessment   Currently in Pain? No/denies          Capillary Blood Glucose: No results found for this or any previous visit (from the past 24 hours).    Social History   Tobacco Use  Smoking Status Former   Current packs/day: 0.00   Average packs/day: 1 pack/day for 38.0 years (38.0 ttl pk-yrs)   Types: Cigarettes   Start date: 02/20/1982   Quit date: 02/21/2020   Years since quitting: 4.3  Smokeless Tobacco Never    Goals Met:  Proper associated with RPD/PD & O2 Sat Independence with exercise equipment Using PLB without cueing & demonstrates good technique Exercise tolerated well No report of concerns or symptoms today Strength training completed today  Goals Unmet:  Not Applicable  Comments: Pt able to follow exercise prescription today without complaint.  Will continue to monitor for progression.

## 2024-06-16 ENCOUNTER — Encounter (HOSPITAL_COMMUNITY): Payer: Self-pay | Admitting: *Deleted

## 2024-06-16 DIAGNOSIS — J4489 Other specified chronic obstructive pulmonary disease: Secondary | ICD-10-CM

## 2024-06-16 DIAGNOSIS — J439 Emphysema, unspecified: Secondary | ICD-10-CM

## 2024-06-16 NOTE — Progress Notes (Signed)
 Pulmonary Individual Treatment Plan  Patient Details  Name: Ricardo Burch MRN: [id number] Date of Birth: 1960-12-25 Referring Provider:   Flowsheet Row PULMONARY REHAB OTHER RESP ORIENTATION from 05/17/2024 in [facility] CARDIAC REHABILITATION  Referring Provider Theophilus Roosevelt MD    Initial Encounter Date:  Flowsheet Row PULMONARY REHAB OTHER RESP ORIENTATION from 05/17/2024 in [facility]  Date 05/17/24    Visit Diagnosis: COPD with chronic bronchitis and emphysema (HCC)  Patient's Home Medications on Admission:   Current Outpatient Medications:    albuterol  (VENTOLIN  HFA) 108 (90 Base) MCG/ACT inhaler, INHALE 1-2 PUFFS BY MOUTH EVERY 6 HOURS AS NEEDED FOR WHEEZING AND SHORTNESS OF BREATH, Disp: 8.5 g, Rfl: 11   amLODipine  (NORVASC ) 5 MG tablet, TAKE 1 TABLET BY MOUTH DAILY, Disp: 30 tablet, Rfl: 11   fluticasone  (FLONASE ) 50 MCG/ACT nasal spray, INSTILL TWO (2) SPRAYS IN EACH NOSTRIL TWICE DAILY, Disp: 16 g, Rfl: 11   furosemide  (LASIX ) 20 MG tablet, Take 20 mg by mouth daily., Disp: , Rfl:    OXYGEN , Inhale 3 L into the lungs daily. continuous, Disp: , Rfl:    OZEMPIC, 1 MG/DOSE, 4 MG/3ML SOPN, SMARTSIG:0.75 Milliliter(s) SUB-Q Once a Week, Disp: , Rfl:    roflumilast  (DALIRESP ) 500 MCG TABS tablet, TAKE 1 TABLET ( ) BY MOUTH DAILY, Disp: 30 tablet, Rfl: 11   sildenafil  (VIAGRA ) 50 MG tablet, Take 1 tablet (50 mg total) by mouth daily as needed for erectile dysfunction. (Patient not taking: Reported on 05/11/2024), Disp: 30 tablet, Rfl: 1   spironolactone (ALDACTONE) 25 MG tablet, Take 25 mg by mouth daily., Disp: , Rfl:    TRELEGY ELLIPTA  200-62.5-25 MCG/ACT AEPB, INHALE ONE (1) PUFF BY MOUTH DAILY, Disp: 60 each, Rfl: 10  Past Medical History: Past Medical History:  Diagnosis Date   Allergy    CHF (congestive heart failure) (HCC)    HFpEF   Chronic kidney disease    COPD (chronic obstructive pulmonary disease) (HCC) Dx 2015   Depression    Eczema     since childhood    Emphysema of lung (HCC)    Erectile dysfunction 08/18/2019   GERD (gastroesophageal reflux disease) 01/25/2020   Hyperlipidemia 01/12/2018   Hypertension    Oxygen  deficiency    Screening for HIV (human immunodeficiency virus)    Sleep apnea    CPAP   Substance abuse (HCC)    MJ, cocaine   Testicular failure 07/22/2019    Tobacco Use: Social History   Tobacco Use  Smoking Status Former   Current packs/day: 0.00   Average packs/day: 1 pack/day for 38.0 years (38.0 ttl pk-yrs)   Types: Cigarettes   Start date: 02/20/1982   Quit date: 02/21/2020   Years since quitting: 4.3  Smokeless Tobacco Never    Labs: Review Flowsheet  More data exists      Latest Ref Rng & Units 10/25/2020 05/16/2022 11/20/2022 05/19/2023 01/30/2024  Labs for ITP Cardiac and Pulmonary Rehab  Cholestrol 100 - 199 mg/dL 842  843  839  849  837   LDL (calc) 0 - 99 mg/dL 86  94  99  87  85   HDL-C >39 mg/dL 56  51  47  52  65   Trlycerides 0 - 149 mg/dL 64  52  70  49  60   Hemoglobin A1c 4.8 - 5.6 % 5.8  5.7  5.6  5.7  5.6     Capillary Blood Glucose: Lab Results  Component Value Date  GLUCAP 85 05/12/2024   GLUCAP 84 11/20/2019   GLUCAP 93 05/12/2018   GLUCAP 179 (H) 07/27/2016   GLUCAP 156 (H) 07/27/2016     Pulmonary Assessment Scores:  Pulmonary Assessment Scores     Row Name 05/11/24 1319         ADL UCSD   ADL Phase Entry     SOB Score total 39     Rest 0     Walk 3     Stairs 3     Bath 2     Dress 2     Shop 2       CAT Score   CAT Score 14       UCSD: Self-administered rating of dyspnea associated with activities of daily living (ADLs) 6-point scale (0 = not at all to 5 = maximal or unable to do because of breathlessness)  Scoring Scores range from 0 to 120.  Minimally important difference is 5 units  CAT: CAT can identify the health impairment of COPD patients and is better correlated with disease progression.  CAT has a scoring range of zero  to 40. The CAT score is classified into four groups of low (less than 10), medium (10 - 20), high (21-30) and very high (31-40) based on the impact level of disease on health status. A CAT score over 10 suggests significant symptoms.  A worsening CAT score could be explained by an exacerbation, poor medication adherence, poor inhaler technique, or progression of COPD or comorbid conditions.  CAT MCID is 2 points  mMRC: mMRC (Modified Medical Research Council) Dyspnea Scale is used to assess the degree of baseline functional disability in patients of respiratory disease due to dyspnea. No minimal important difference is established. A decrease in score of 1 point or greater is considered a positive change.   Pulmonary Function Assessment:  Pulmonary Function Assessment - 05/11/24 1311       Pulmonary Function Tests   FVC% 56 %    FEV1% 37 %    FEV1/FVC Ratio 61    DLCO% 50 %          Exercise Target Goals: Exercise Program Goal: Individual exercise prescription set using results from initial 6 min walk test and THRR while considering  patient's activity barriers and safety.   Exercise Prescription Goal: Initial exercise prescription builds to 30-45 minutes a day of aerobic activity, 2-3 days per week.  Home exercise guidelines will be given to patient during program as part of exercise prescription that the participant will acknowledge.  Activity Barriers & Risk Stratification:  Activity Barriers & Cardiac Risk Stratification - 05/11/24 1304       Activity Barriers & Cardiac Risk Stratification   Activity Barriers Muscular Weakness;Deconditioning;Shortness of Breath          6 Minute Walk:  6 Minute Walk     Row Name 05/17/24 1052         6 Minute Walk   Phase Initial     Distance 570 feet     Walk Time 3.46 minutes     # of Rest Breaks 0     MPH 1.87     METS 1.8     RPE 13     Perceived Dyspnea  3     VO2 Peak 6.3     Symptoms Yes (comment)     Comments pt o2  dropped 74 at 3.46 min test stopped     Resting HR 74 bpm  Resting BP 112/78     Resting Oxygen  Saturation  90 %     Exercise Oxygen  Saturation  during 6 min walk 74 %     Max Ex. HR 93 bpm     Max Ex. BP 150/70     2 Minute Post BP 126/70       Interval HR   1 Minute HR 91     2 Minute HR 87     3 Minute HR 93     6 Minute HR 74     2 Minute Post HR 71     Interval Heart Rate? Yes       Interval Oxygen    Interval Oxygen ? Yes     Baseline Oxygen  Saturation % 90 %     1 Minute Oxygen  Saturation % 90 %     1 Minute Liters of Oxygen  0 L     2 Minute Oxygen  Saturation % 86 %     2 Minute Liters of Oxygen  0 L     3 Minute Oxygen  Saturation % 74 %     3 Minute Liters of Oxygen  0 L     6 Minute Oxygen  Saturation % 87 %     6 Minute Liters of Oxygen  0 L     2 Minute Post Oxygen  Saturation % 88 %     2 Minute Post Liters of Oxygen  0 L        Oxygen  Initial Assessment:  Oxygen  Initial Assessment - 05/11/24 1310       Home Oxygen    Home Oxygen  Device Portable Concentrator;Home Concentrator    Sleep Oxygen  Prescription CPAP    Liters per minute 3    Home Exercise Oxygen  Prescription Continuous    Liters per minute 3    Home Resting Oxygen  Prescription Continuous    Liters per minute 3    Compliance with Home Oxygen  Use Yes      Intervention   Short Term Goals To learn and exhibit compliance with exercise, home and travel O2 prescription;To learn and understand importance of monitoring SPO2 with pulse oximeter and demonstrate accurate use of the pulse oximeter.;To learn and understand importance of maintaining oxygen  saturations>88%;To learn and demonstrate proper pursed lip breathing techniques or other breathing techniques. ;To learn and demonstrate proper use of respiratory medications    Long  Term Goals Exhibits compliance with exercise, home  and travel O2 prescription;Verbalizes importance of monitoring SPO2 with pulse oximeter and return demonstration;Maintenance of O2  saturations>88%;Exhibits proper breathing techniques, such as pursed lip breathing or other method taught during program session;Compliance with respiratory medication;Demonstrates proper use of MDI's          Oxygen  Re-Evaluation:  Oxygen  Re-Evaluation     Row Name 06/08/24 1038             Goals/Expected Outcomes   Comments Reviewed PLB technique with pt.  Talked about how it works and it's importance in maintaining their exercise saturations.       Goals/Expected Outcomes Short: Become more profiecient at using PLB.   Long: Become independent at using PLB.          Oxygen  Discharge (Final Oxygen  Re-Evaluation):  Oxygen  Re-Evaluation - 06/08/24 1038       Goals/Expected Outcomes   Comments Reviewed PLB technique with pt.  Talked about how it works and it's importance in maintaining their exercise saturations.    Goals/Expected Outcomes Short: Become more profiecient at using PLB.   Long: Become independent  at using PLB.          Initial Exercise Prescription:  Initial Exercise Prescription - 05/17/24 1000       Date of Initial Exercise RX and Referring Provider   Date 05/17/24    Referring Provider Mannam, Praveen MD      Oxygen    Oxygen  Continuous    Liters 3    Maintain Oxygen  Saturation 88% or higher      Treadmill   MPH 1.2    Grade 0    Minutes 15    METs 1.92      REL-XR   Level 1    Speed 50    Minutes 15    METs 1.9      Prescription Details   Frequency (times per week) 2    Duration Progress to 30 minutes of continuous aerobic without signs/symptoms of physical distress      Intensity   THRR 40-80% of Max Heartrate 108-143    Ratings of Perceived Exertion 11-13    Perceived Dyspnea 0-4      Resistance Training   Training Prescription Yes    Weight 7    Reps 10-15          Perform Capillary Blood Glucose checks as needed.  Exercise Prescription Changes:   Exercise Prescription Changes     Row Name 05/17/24 1100              Response to Exercise   Blood Pressure (Admit) 112/78       Blood Pressure (Exercise) 150/70       Blood Pressure (Exit) 126/70       Heart Rate (Admit) 74 bpm       Heart Rate (Exercise) 93 bpm       Heart Rate (Exit) 71 bpm       Oxygen  Saturation (Admit) 90 %       Oxygen  Saturation (Exercise) 74 %       Oxygen  Saturation (Exit) 88 %       Rating of Perceived Exertion (Exercise) 13       Perceived Dyspnea (Exercise) 3       Duration Progress to 30 minutes of  aerobic without signs/symptoms of physical distress       Intensity THRR unchanged          Exercise Comments:   Exercise Comments     Row Name 05/11/24 1310 05/17/24 1029 06/08/24 1037       Exercise Comments Juleon currently doesn't do any home exercise besides ADL's and walking his dog. Patient attend orientation today.  Patient is attending Pulmonary Rehabilitation Program.  Documentation for diagnosis can be found in [facility].  Reviewed medical chart, RPE/RPD, gym safety, and program guidelines.  Patient was fitted to equipment they will be using during rehab.  Patient is scheduled to start exercise on 06/08/24.   Initial ITP created and sent for review and signature by Dr. Anton Kelp, Medical Director for Pulmonary Rehabilitation Program. First full day of exercise!  Patient was oriented to gym and equipment including functions, settings, policies, and procedures.  Patient's individual exercise prescription and treatment plan were reviewed.  All starting workloads were established based on the results of the 6 minute walk test done at initial orientation visit.  The plan for exercise progression was also introduced and progression will be customized based on patient's performance and goals.        Exercise Goals and Review:   Exercise Goals  Row Name 05/11/24 1309             Exercise Goals   Increase Physical Activity Yes       Intervention Provide advice, education, support and counseling about physical  activity/exercise needs.;Develop an individualized exercise prescription for aerobic and resistive training based on initial evaluation findings, risk stratification, comorbidities and participant's personal goals.       Expected Outcomes Short Term: Attend rehab on a regular basis to increase amount of physical activity.;Long Term: Add in home exercise to make exercise part of routine and to increase amount of physical activity.;Long Term: Exercising regularly at least 3-5 days a week.       Increase Strength and Stamina Yes       Intervention Provide advice, education, support and counseling about physical activity/exercise needs.;Develop an individualized exercise prescription for aerobic and resistive training based on initial evaluation findings, risk stratification, comorbidities and participant's personal goals.       Expected Outcomes Short Term: Increase workloads from initial exercise prescription for resistance, speed, and METs.;Short Term: Perform resistance training exercises routinely during rehab and add in resistance training at home;Long Term: Improve cardiorespiratory fitness, muscular endurance and strength as measured by increased METs and functional capacity ( )       Able to understand and use rate of perceived exertion (RPE) scale Yes       Intervention Provide education and explanation on how to use RPE scale       Expected Outcomes Short Term: Able to use RPE daily in rehab to express subjective intensity level;Long Term:  Able to use RPE to guide intensity level when exercising independently       Able to understand and use Dyspnea scale Yes       Intervention Provide education and explanation on how to use Dyspnea scale       Expected Outcomes Short Term: Able to use Dyspnea scale daily in rehab to express subjective sense of shortness of breath during exertion;Long Term: Able to use Dyspnea scale to guide intensity level when exercising independently       Knowledge and  understanding of Target Heart Rate Range (THRR) Yes       Intervention Provide education and explanation of THRR including how the numbers were predicted and where they are located for reference       Expected Outcomes Short Term: Able to state/look up THRR;Short Term: Able to use daily as guideline for intensity in rehab;Long Term: Able to use THRR to govern intensity when exercising independently       Able to check pulse independently Yes       Intervention Provide education and demonstration on how to check pulse in carotid and radial arteries.;Review the importance of being able to check your own pulse for safety during independent exercise       Expected Outcomes Short Term: Able to explain why pulse checking is important during independent exercise;Long Term: Able to check pulse independently and accurately       Understanding of Exercise Prescription Yes       Intervention Provide education, explanation, and written materials on patient's individual exercise prescription       Expected Outcomes Short Term: Able to explain program exercise prescription;Long Term: Able to explain home exercise prescription to exercise independently          Exercise Goals Re-Evaluation :  Exercise Goals Re-Evaluation     Row Name 06/08/24 1037  Exercise Goal Re-Evaluation   Exercise Goals Review Able to understand and use rate of perceived exertion (RPE) scale;Knowledge and understanding of Target Heart Rate Range (THRR)       Comments Reviewed RPE and dyspnea scale, THR and program prescription with pt today.  Pt voiced understanding and was given a copy of goals to take home.       Expected Outcomes Short: Use RPE daily to regulate intensity.  Long: Follow program prescription in THR.          Discharge Exercise Prescription (Final Exercise Prescription Changes):  Exercise Prescription Changes - 05/17/24 1100       Response to Exercise   Blood Pressure (Admit) 112/78    Blood  Pressure (Exercise) 150/70    Blood Pressure (Exit) 126/70    Heart Rate (Admit) 74 bpm    Heart Rate (Exercise) 93 bpm    Heart Rate (Exit) 71 bpm    Oxygen  Saturation (Admit) 90 %    Oxygen  Saturation (Exercise) 74 %    Oxygen  Saturation (Exit) 88 %    Rating of Perceived Exertion (Exercise) 13    Perceived Dyspnea (Exercise) 3    Duration Progress to 30 minutes of  aerobic without signs/symptoms of physical distress    Intensity THRR unchanged          Nutrition:  Target Goals: Understanding of nutrition guidelines, daily intake of sodium 1500mg , cholesterol 200mg , calories 30% from fat and 7% or less from saturated fats, daily to have 5 or more servings of fruits and vegetables.  Biometrics:  Pre Biometrics - 05/17/24 1102       Pre Biometrics   Height 5' 9 (1.753 m)    Weight 238 lb 1.6 oz (108 kg)    Waist Circumference 47 inches    Hip Circumference 42 inches    Waist to Hip Ratio 1.12 %    BMI (Calculated) 35.14    Grip Strength 44.2 kg           Nutrition Therapy Plan and Nutrition Goals:  Nutrition Therapy & Goals - 05/11/24 1313       Intervention Plan   Intervention Prescribe, educate and counsel regarding individualized specific dietary modifications aiming towards targeted core components such as weight, hypertension, lipid management, diabetes, heart failure and other comorbidities.;Nutrition handout(s) given to patient.    Expected Outcomes Short Term Goal: Understand basic principles of dietary content, such as calories, fat, sodium, cholesterol and nutrients.;Short Term Goal: A plan has been developed with personal nutrition goals set during dietitian appointment.;Long Term Goal: Adherence to prescribed nutrition plan.          Nutrition Assessments:  MEDIFICTS Score Key: >=70 Need to make dietary changes  40-70 Heart Healthy Diet <= 40 Therapeutic Level Cholesterol Diet  Flowsheet Row PULMONARY VIRTUAL BASED CARE from 05/11/2024 in [facility]ATION  Picture Your Plate Total Score on Admission 49   Picture Your Plate Scores: <59 Unhealthy dietary pattern with much room for improvement. 41-50 Dietary pattern unlikely to meet recommendations for good health and room for improvement. 51-60 More healthful dietary pattern, with some room for improvement.  >60 Healthy dietary pattern, although there may be some specific behaviors that could be improved.    Nutrition Goals Re-Evaluation:   Nutrition Goals Discharge (Final Nutrition Goals Re-Evaluation):   Psychosocial: Target Goals: Acknowledge presence or absence of significant depression and/or stress, maximize coping skills, provide positive support system. Participant is able to verbalize types and ability  to use techniques and skills needed for reducing stress and depression.  Initial Review & Psychosocial Screening:  Initial Psych Review & Screening - 05/11/24 1313       Initial Review   Current issues with None Identified      Family Dynamics   Good Support System? Yes    Comments Dewan didn't specify who his support system was, but he states he has people to help him if needed.      Barriers   Psychosocial barriers to participate in program There are no identifiable barriers or psychosocial needs.      Screening Interventions   Interventions Encouraged to exercise    Expected Outcomes Short Term goal: Identification and review with participant of any Quality of Life or Depression concerns found by scoring the questionnaire.;Long Term goal: The participant improves quality of Life and PHQ9 Scores as seen by post scores and/or verbalization of changes;Long Term Goal: Stressors or current issues are controlled or eliminated.;Short Term goal: Utilizing psychosocial counselor, staff and physician to assist with identification of specific Stressors or current issues interfering with healing process. Setting desired goal for each stressor or current issue  identified.          Quality of Life Scores:  Scores of 19 and below usually indicate a poorer quality of life in these areas.  A difference of  2-3 points is a clinically meaningful difference.  A difference of 2-3 points in the total score of the Quality of Life Index has been associated with significant improvement in overall quality of life, self-image, physical symptoms, and general health in studies assessing change in quality of life.   PHQ-9: Review Flowsheet  More data exists      05/17/2024 02/11/2024 01/20/2024 01/13/2024 01/06/2024  Depression screen PHQ 2/9  Decreased Interest 0 1 0 0 0 0  Down, Depressed, Hopeless 0 1 0 0 0 0  PHQ - 2 Score 0 2 0 0 0 0  Altered sleeping 0 1 0 0 0 0  Tired, decreased energy 1 2 0 0 0 0  Change in appetite 0 0 0 0 0 0  Feeling bad or failure about yourself  0 1 0 0 0 0  Trouble concentrating 0 0 0 0 0 0  Moving slowly or fidgety/restless 1 2 0 0 0 0  Suicidal thoughts 0 0 0 0 0 0  PHQ-9 Score 2 8 0 0 0 0  Difficult doing work/chores Not difficult at all Somewhat difficult Not difficult at all Not difficult at all Not difficult at all Not difficult at all    Details       Multiple values from one day are sorted in reverse-chronological order        Interpretation of Total Score  Total Score Depression Severity:  1-4 = Minimal depression, 5-9 = Mild depression, 10-14 = Moderate depression, 15-19 = Moderately severe depression, 20-27 = Severe depression   Psychosocial Evaluation and Intervention:  Psychosocial Evaluation - 05/11/24 1314       Psychosocial Evaluation & Interventions   Interventions Encouraged to exercise with the program and follow exercise prescription    Comments Ricardo Burch is a pleasant gentleman who is coming into rehab for COPD with chronic bronchitis and emphysema. He has been in the program before but it was for cardiac reasoning.  Melvyn states  that he gets around well and is independent with his ADL's. He does not  currently do any home exercise besides ADL's and walking his  dog outside. He is on 3L oxygen  as needed, and a CPAP at night with 3L. He is having a colonscopy tomorrow and is not very happy about that, but he is eager to start back our program starting Monday the 14th.    Expected Outcomes Short: Increase strength and stamina. Long: Get off oxygen  or be able to wean down eventually.    Continue Psychosocial Services  Follow up required by staff          Psychosocial Re-Evaluation:   Psychosocial Discharge (Final Psychosocial Re-Evaluation):    Education: Education Goals: Education classes will be provided on a weekly basis, covering required topics. Participant will state understanding/return demonstration of topics presented.  Learning Barriers/Preferences:  Learning Barriers/Preferences - 05/11/24 1313       Learning Barriers/Preferences   Learning Barriers None    Learning Preferences Video;Written Material;Pictoral          Education Topics: How Lungs Work and Diseases: - Discuss the anatomy of the lungs and diseases that can affect the lungs, such as COPD. Flowsheet Row PULMONARY REHAB OTHER RESPIRATORY from 06/08/2020 in [place] [facility]  Date 03/12/18  Educator D. Coad  Instruction Review Code 2- Demonstrated Understanding    Exercise: -Discuss the importance of exercise, FITT principles of exercise, normal and abnormal responses to exercise, and how to exercise safely. Flowsheet Row PULMONARY REHAB OTHER RESPIRATORY from 06/08/2020 in [place] [facility]  Date 03/05/18  Educator GC  Instruction Review Code 2- Demonstrated Understanding    Environmental Irritants: -Discuss types of environmental irritants and how to limit exposure to environmental irritants. Flowsheet Row PULMONARY REHAB OTHER RESPIRATORY from 06/08/2020 in [place] [facility]  Date 05/18/20  Educator CHARM Louder  Instruction Review Code 1- Verbalizes  Understanding    Meds/Inhalers and oxygen : - Discuss respiratory medications, definition of an inhaler and oxygen , and the proper way to use an inhaler and oxygen . Flowsheet Row PULMONARY REHAB OTHER RESPIRATORY from 06/08/2020 in [place] [facility]  Date 03/26/18  Educator DC    Energy Saving Techniques: - Discuss methods to conserve energy and decrease shortness of breath when performing activities of daily living.  Flowsheet Row PULMONARY REHAB OTHER RESPIRATORY from 06/08/2020 in [place] [facility]  Date 04/02/18  Educator GC  Instruction Review Code 2- Demonstrated Understanding    Bronchial Hygiene / Breathing Techniques: - Discuss breathing mechanics, pursed-lip breathing technique,  proper posture, effective ways to clear airways, and other functional breathing techniques Flowsheet Row PULMONARY REHAB OTHER RESPIRATORY from 06/08/2020 in [place] [facility]  Date 04/09/18  Educator DC  Instruction Review Code 2- Demonstrated Understanding    Cleaning Equipment: - Provides group verbal and written instruction about the health risks of elevated stress, cause of high stress, and healthy ways to reduce stress. Flowsheet Row PULMONARY REHAB OTHER RESPIRATORY from 06/08/2020 in [facility]  Date 01/15/18  Educator GC  Instruction Review Code 2- Demonstrated Understanding    Nutrition I: Fats: - Discuss the types of cholesterol, what cholesterol does to the body, and how cholesterol levels can be controlled. Flowsheet Row PULMONARY REHAB OTHER RESPIRATORY from 06/08/2020 in [place] [facility]  Date 01/22/18  Educator GC  Instruction Review Code 2- Demonstrated Understanding    Nutrition II: Labels: -Discuss the different components of food labels and how to read food labels. Flowsheet Row PULMONARY REHAB OTHER RESPIRATORY from 06/08/2020 in [place] [facility]  Date 01/29/18  Educator DJ   Instruction  Review Code 2- Demonstrated Understanding    Respiratory Infections: - Discuss the signs and symptoms of respiratory infections, ways to prevent respiratory infections, and the importance of seeking medical treatment when having a respiratory infection. Flowsheet Row PULMONARY REHAB OTHER RESPIRATORY from 06/08/2020 in [place] [facility]  Date 02/05/18  Educator GC  Instruction Review Code 2- Demonstrated Understanding    Stress I: Signs and Symptoms: - Discuss the causes of stress, how stress may lead to anxiety and depression, and ways to limit stress.   Stress II: Relaxation: -Discuss relaxation techniques to limit stress. Flowsheet Row PULMONARY REHAB OTHER RESPIRATORY from 06/08/2020 in [place] [facility]  Date 04/20/20  Educator DF  Instruction Review Code 2- Demonstrated Understanding    Oxygen  for Home/Travel: - Discuss how to prepare for travel when on oxygen  and proper ways to transport and store oxygen  to ensure safety.   Knowledge Questionnaire Score:  Knowledge Questionnaire Score - 05/11/24 1324       Knowledge Questionnaire Score   Pre Score 15/18          Core Components/Risk Factors/Patient Goals at Admission:  Personal Goals and Risk Factors at Admission - 05/11/24 1312       Core Components/Risk Factors/Patient Goals on Admission   Improve shortness of breath with ADL's Yes    Intervention Provide education, individualized exercise plan and daily activity instruction to help decrease symptoms of SOB with activities of daily living.    Expected Outcomes Short Term: Improve cardiorespiratory fitness to achieve a reduction of symptoms when performing ADLs;Long Term: Be able to perform more ADLs without symptoms or delay the onset of symptoms    Increase knowledge of respiratory medications and ability to use respiratory devices properly  Yes    Intervention Provide education and demonstration as needed of  appropriate use of medications, inhalers, and oxygen  therapy.    Expected Outcomes Long Term: Maintain appropriate use of medications, inhalers, and oxygen  therapy.;Short Term: Achieves understanding of medications use. Understands that oxygen  is a medication prescribed by physician. Demonstrates appropriate use of inhaler and oxygen  therapy.    Heart Failure Yes    Intervention Provide a combined exercise and nutrition program that is supplemented with education, support and counseling about heart failure. Directed toward relieving symptoms such as shortness of breath, decreased exercise tolerance, and extremity edema.    Expected Outcomes Improve functional capacity of life;Short term: Attendance in program 2-3 days a week with increased exercise capacity. Reported lower sodium intake. Reported increased fruit and vegetable intake. Reports medication compliance.;Short term: Daily weights obtained and reported for increase. Utilizing diuretic protocols set by physician.;Long term: Adoption of self-care skills and reduction of barriers for early signs and symptoms recognition and intervention leading to self-care maintenance.    Hypertension Yes    Intervention Provide education on lifestyle modifcations including regular physical activity/exercise, weight management, moderate sodium restriction and increased consumption of fresh fruit, vegetables, and low fat dairy, alcohol moderation, and smoking cessation.;Monitor prescription use compliance.    Expected Outcomes Short Term: Continued assessment and intervention until BP is < 140/45mm HG in hypertensive participants. < 130/29mm HG in hypertensive participants with diabetes, heart failure or chronic kidney disease.;Long Term: Maintenance of blood pressure at goal levels.    Lipids Yes    Intervention Provide education and support for participant on nutrition & aerobic/resistive exercise along with prescribed medications to achieve LDL 70mg , HDL >40mg .     Expected Outcomes Long Term: Cholesterol controlled with medications as  prescribed, with individualized exercise RX and with personalized nutrition plan. Value goals: LDL < 70mg , HDL > 40 mg.;Short Term: Participant states understanding of desired cholesterol values and is compliant with medications prescribed. Participant is following exercise prescription and nutrition guidelines.          Core Components/Risk Factors/Patient Goals Review:    Core Components/Risk Factors/Patient Goals at Discharge (Final Review):    ITP Comments:  ITP Comments     Row Name 05/11/24 1308 05/17/24 1029 05/19/24 0925 06/08/24 1037 06/16/24 1150   ITP Comments Completed virtual orientation today.  EP evaluation is scheduled for 05/17/24 at 1000 .  Documentation for diagnosis can be found in [facility] encounter 04/28/24. Patient attend orientation today.  Patient is attending Pulmonary Rehabilitation Program.  Documentation for diagnosis can be found in [facility].  Reviewed medical chart, RPE/RPD, gym safety, and program guidelines.  Patient was fitted to equipment they will be using during rehab.  Patient is scheduled to start exercise on 06/08/24.   Initial ITP created and sent for review and signature by Dr. Anton Kelp, Medical Director for Pulmonary Rehabilitation Program. 30 day review completed. ITP sent to Dr.Jehanzeb Memon, Medical Director of  Pulmonary Rehab. Continue with ITP unless changes are made by physician.  New to program, only attended orientation thus far. First full day of exercise!  Patient was oriented to gym and equipment including functions, settings, policies, and procedures.  Patient's individual exercise prescription and treatment plan were reviewed.  All starting workloads were established based on the results of the 6 minute walk test done at initial orientation visit.  The plan for exercise progression was also introduced and progression will be customized based on patient's performance and goals.  30 day review completed. ITP sent to Dr.Jehanzeb Memon, Medical Director of  Pulmonary Rehab. Continue with ITP unless changes are made by physician.   Still new to program with start delayed to 06/08/24 and has only completed two exercise sessions thus far.      Comments: 30 day review

## 2024-06-17 ENCOUNTER — Encounter (HOSPITAL_COMMUNITY)
Admission: RE | Admit: 2024-06-17 | Discharge: 2024-06-17 | Disposition: A | Discharge status: Home / Self Care | Source: Ambulatory Visit | Attending: Pulmonary Disease | Admitting: Pulmonary Disease

## 2024-06-17 DIAGNOSIS — J4489 Other specified chronic obstructive pulmonary disease: Secondary | ICD-10-CM

## 2024-06-17 DIAGNOSIS — J439 Emphysema, unspecified: Secondary | ICD-10-CM | POA: Diagnosis not present

## 2024-06-17 NOTE — Progress Notes (Signed)
 Daily Session Note  Patient Details  Name: Ricardo Burch MRN: [id number] Date of Birth: [birth year] Referring Provider:   Flowsheet Row PULMONARY REHAB OTHER RESP ORIENTATION from 05/17/2024 in [facility]  Referring Provider Theophilus Roosevelt MD    Encounter Date: 06/17/2024  Check In:  Session Check In - 06/17/24 1030       Check-In   Supervising physician immediately available to respond to emergencies See telemetry face sheet for immediately available MD    Location [facility]    Staff Present Powell Benders, BS, Exercise Physiologist;Other    Virtual Visit No    Medication changes reported     No    Fall or balance concerns reported    No    Tobacco Cessation No Change    Warm-up and Cool-down Performed on first and last piece of equipment    Resistance Training Performed Yes    VAD Patient? No    PAD/SET Patient? No      Pain Assessment   Currently in Pain? No/denies    Multiple Pain Sites No          Capillary Blood Glucose: No results found for this or any previous visit (from the past 24 hours).    Social History   Tobacco Use  Smoking Status Former   Current packs/day: 0.00   Average packs/day: 1 pack/day for 38.0 years (38.0 ttl pk-yrs)   Types: Cigarettes   Start date: 02/20/1982   Quit date: 02/21/2020   Years since quitting: 4.3  Smokeless Tobacco Never    Goals Met:  Independence with exercise equipment Exercise tolerated well No report of concerns or symptoms today Strength training completed today  Goals Unmet:  Not Applicable  Comments: Pt able to follow exercise prescription today without complaint.  Will continue to monitor for progression.

## 2024-06-18 ENCOUNTER — Encounter: Payer: Self-pay | Admitting: Internal Medicine

## 2024-06-18 ENCOUNTER — Ambulatory Visit (INDEPENDENT_AMBULATORY_CARE_PROVIDER_SITE_OTHER): Admitting: Internal Medicine

## 2024-06-18 VITALS — BP 113/70 | HR 73 | Ht 68.0 in | Wt 236.4 lb

## 2024-06-18 DIAGNOSIS — J9611 Chronic respiratory failure with hypoxia: Secondary | ICD-10-CM

## 2024-06-18 DIAGNOSIS — I10 Essential (primary) hypertension: Secondary | ICD-10-CM | POA: Diagnosis not present

## 2024-06-18 DIAGNOSIS — Z0001 Encounter for general adult medical examination with abnormal findings: Secondary | ICD-10-CM | POA: Diagnosis not present

## 2024-06-18 DIAGNOSIS — Z23 Encounter for immunization: Secondary | ICD-10-CM | POA: Diagnosis not present

## 2024-06-18 DIAGNOSIS — J432 Centrilobular emphysema: Secondary | ICD-10-CM

## 2024-06-18 NOTE — Assessment & Plan Note (Signed)
 BP Readings from Last 1 Encounters:  06/18/24 113/70   Well-controlled with amlodipine  5 mg QD Counseled for compliance with the medications Advised DASH diet and moderate exercise/walking as tolerated

## 2024-06-18 NOTE — Assessment & Plan Note (Signed)
 Doing well on room air at rest today, but keeps home O2 for PRN use, needs it upon ambulation Uses continuous home O2 at 3-4 LPM due to hypoxia at times

## 2024-06-18 NOTE — Assessment & Plan Note (Signed)
 On Trelegy and as needed albuterol  On Daliresp  Followed by pulmonology- Dr. Theophilus Was referred to [facility] for lung reduction surgery - but since his symptoms are improved with weight loss, it was deemed that he would not benefit any further

## 2024-06-18 NOTE — Progress Notes (Signed)
 Established Patient Office Visit  Subjective:  Patient ID: Ricardo Burch, male    DOB: 1960/12/01  Age: 63 y.o. MRN: [id number]  CC:  Chief Complaint  Patient presents with   Annual Exam    Cpe, 4 month f/u    HPI Ricardo Burch is a 63 y.o. male with past medical history of HTN, COPD, chronic hypoxic respiratory failure, OSA, GERD and chronic fatigue who presents for f/u of his chronic medical conditions.  HTN: BP is well-controlled. Takes medications regularly. Patient denies headache, dizziness, chest pain, or palpitations.   Chronic hypoxic respiratory failure and COPD: He uses Trelegy and as needed albuterol  for COPD and follows up with Dr. Theophilus.  He reports chronic nasal congestion and mild chest tightness with coughing. Denies any hemoptysis or recent worsening of dyspnea or wheezing.  Denies any fever or chills.  CHF and pulmonary HTN: His echo showed pulmonary HTN, for which he was referred to pulmonology HTN clinic at [facility]. He currently uses home O2 at 3 LPM PRN.  He has chronic dyspnea, but has improved lately with weight loss.  He was placed on Farxiga for HFpEF and Ozempic for weight loss benefit by cardiology clinic at [facility]. He has lost about 25 lbs since 05/23, but has plateued now. His constipation has improved with Senokot S.  His back pain has improved lately. He has stopped taking Gabapentin .  He has used Viagra  with adequate response for ED.  Past Medical History:  Diagnosis Date   Allergy    CHF (congestive heart failure) (HCC)    HFpEF   Chronic kidney disease    COPD (chronic obstructive pulmonary disease) (HCC) Dx 2015   Depression    Eczema    since childhood    Emphysema of lung (HCC)    Erectile dysfunction 08/18/2019   GERD (gastroesophageal reflux disease) 01/25/2020   Hyperlipidemia 01/12/2018   Hypertension    Oxygen  deficiency    Screening for HIV (human immunodeficiency virus)    Sleep apnea    CPAP   Substance abuse (HCC)    MJ,  cocaine   Testicular failure 07/22/2019    Past Surgical History:  Procedure Laterality Date   COLONOSCOPY N/A 05/12/2024   Procedure: COLONOSCOPY;  Surgeon: Shaaron Lamar HERO, MD;  Location: [facility];  Service: Endoscopy;  Laterality: N/A;  9:45 am, asa 3   COLONOSCOPY WITH PROPOFOL  N/A 06/25/2017   Surgeon: Shaaron Lamar HERO, MD;  diverticulosis, two 4 to 6 mm polyps resected and retrieved.  Pathology with tubular adenoma x 1 and benign colonic mucosa.  Recommended 5-year surveillance.   MULTIPLE EXTRACTIONS WITH ALVEOLOPLASTY N/A 03/22/2016   Procedure: MULTIPLE EXTRACTION WITH ALVEOLOPLASTY;  Surgeon: Glendia Primrose, DDS;  Location: [facility] OR;  Service: Oral Surgery;  Laterality: N/A;   NO PAST SURGERIES     POLYPECTOMY  06/25/2017   Procedure: POLYPECTOMY;  Surgeon: Shaaron Lamar HERO, MD;  Location: [facility];  Service: Endoscopy;;  colon    Family History  Problem Relation Age of Onset   Arthritis Mother    Hypertension Mother    Stroke Mother        age 79   Cerebral aneurysm Father    Alcohol abuse Father    Cancer Brother    Diabetes Neg Hx    Heart disease Neg Hx    Colon cancer Neg Hx     Social History   Socioeconomic History   Marital status: Single    Spouse name:  Not on file   Number of children: 5   Years of education: 70   Highest education level: 12th grade  Occupational History   Occupation: Unemployed     Comment: car wash details  Tobacco Use   Smoking status: Former    Current packs/day: 0.00    Average packs/day: 1 pack/day for 38.0 years (38.0 ttl pk-yrs)    Types: Cigarettes    Start date: 02/20/1982    Quit date: 02/21/2020    Years since quitting: 4.3   Smokeless tobacco: Never  Vaping Use   Vaping status: Never Used  Substance and Sexual Activity   Alcohol use: Yes    Alcohol/week: 1.0 standard drink of alcohol    Types: 1 Cans of beer per week    Comment: Socially x 1/month currently (as of 01/25/20); previously: occassionally: weekends,  6-pack or less on a day; or half pint of liquior   Drug use: Not Currently    Types: Cocaine, Marijuana    Comment: 01/25/20-marijuana few weeks ago, no cocaine (4-6 yeats ago)   Sexual activity: Never  Other Topics Concern   Not on file  Social History Narrative    Lives alone.    5 daughters 57, 72 yo twins, eldest 2 are married live in [place]. Retired.Girlfriend works in Programmer, applications.   Social Drivers of Health   Financial Resource Strain: Medium Risk (06/14/2024)   Overall Financial Resource Strain (CARDIA)    Difficulty of Paying Living Expenses: Somewhat hard  Food Insecurity: Food Insecurity Present (06/14/2024)   Hunger Vital Sign    Worried About Running Out of Food in the Last Year: Sometimes true    Ran Out of Food in the Last Year: Sometimes true  Transportation Needs: No Transportation Needs (06/14/2024)   PRAPARE - Administrator, Civil Service (Medical): No    Lack of Transportation (Non-Medical): No  Physical Activity: Sufficiently Active (06/14/2024)   Exercise Vital Sign    Days of Exercise per Week: 5 days    Minutes of Exercise per Session: 100 min  Stress: No Stress Concern Present (06/14/2024)   [employer] of Occupational Health - Occupational Stress Questionnaire    Feeling of Stress: Not at all  Social Connections: Unknown (06/14/2024)   Social Connection and Isolation Panel    Frequency of Communication with Friends and Family: More than three times a week    Frequency of Social Gatherings with Friends and Family: Twice a week    Attends Religious Services: More than 4 times per year    Active Member of [employer] or Organizations: No    Attends Banker Meetings: Not on file    Marital Status: Not on file  Intimate Partner Violence: Not At Risk (01/06/2024)   Humiliation, Afraid, Rape, and Kick questionnaire    Fear of Current or Ex-Partner: No    Emotionally Abused: No    Physically Abused: No    Sexually Abused: No     Outpatient Medications Prior to Visit  Medication Sig Dispense Refill   albuterol  (VENTOLIN  HFA) 108 (90 Base) MCG/ACT inhaler INHALE 1-2 PUFFS BY MOUTH EVERY 6 HOURS AS NEEDED FOR WHEEZING AND SHORTNESS OF BREATH 8.5 g 11   amLODipine  (NORVASC ) 5 MG tablet TAKE 1 TABLET BY MOUTH DAILY 30 tablet 11   fluticasone  (FLONASE ) 50 MCG/ACT nasal spray INSTILL TWO (2) SPRAYS IN EACH NOSTRIL TWICE DAILY 16 g 11   furosemide  (LASIX ) 20 MG tablet Take 20 mg by  mouth daily.     OXYGEN  Inhale 3 L into the lungs daily. continuous     OZEMPIC, 1 MG/DOSE, 4 MG/3ML SOPN SMARTSIG:0.75 Milliliter(s) SUB-Q Once a Week     roflumilast  (DALIRESP ) 500 MCG TABS tablet TAKE 1 TABLET ( ) BY MOUTH DAILY 30 tablet 11   sildenafil  (VIAGRA ) 50 MG tablet Take 1 tablet (50 mg total) by mouth daily as needed for erectile dysfunction. 30 tablet 1   spironolactone (ALDACTONE) 25 MG tablet Take 25 mg by mouth daily.     TRELEGY ELLIPTA  200-62.5-25 MCG/ACT AEPB INHALE ONE (1) PUFF BY MOUTH DAILY 60 each 10   No facility-administered medications prior to visit.    Allergies  Allergen Reactions   Apresoline  [Hydralazine ] Other (See Comments)    Headache     ROS Review of Systems  Constitutional:  Positive for fatigue. Negative for chills and fever.  HENT:  Positive for congestion. Negative for sore throat.   Eyes:  Negative for pain and discharge.  Respiratory:  Positive for cough (Chronic) and shortness of breath (Intermittent).   Cardiovascular:  Negative for chest pain and palpitations.  Gastrointestinal:  Negative for diarrhea, nausea and vomiting.  Endocrine: Negative for polydipsia and polyuria.  Genitourinary:  Negative for dysuria and hematuria.  Musculoskeletal:  Positive for back pain. Negative for neck pain and neck stiffness.  Skin:  Negative for rash.  Neurological:  Negative for dizziness, weakness, numbness and headaches.  Psychiatric/Behavioral:  Negative for agitation and behavioral problems.        Objective:    Physical Exam Vitals reviewed.  Constitutional:      General: He is not in acute distress.    Appearance: He is not diaphoretic.  HENT:     Head: Normocephalic and atraumatic.     Nose: Congestion present.     Mouth/Throat:     Mouth: Mucous membranes are moist.  Eyes:     General: No scleral icterus.    Extraocular Movements: Extraocular movements intact.  Cardiovascular:     Rate and Rhythm: Normal rate and regular rhythm.     Heart sounds: Normal heart sounds. No murmur heard. Pulmonary:     Breath sounds: No wheezing or rales.     Comments: On home O2 - 3 lpm Musculoskeletal:     Cervical back: Neck supple. No tenderness.     Lumbar back: Tenderness (right paraspinal) present. Decreased range of motion.     Right lower leg: No edema.     Left lower leg: No edema.  Skin:    General: Skin is warm.     Findings: No rash.  Neurological:     General: No focal deficit present.     Mental Status: He is alert and oriented to person, place, and time.     Sensory: No sensory deficit.     Motor: No weakness.  Psychiatric:        Mood and Affect: Mood normal.        Behavior: Behavior normal.     BP 113/70   Pulse 73   Ht 5' 8 (1.727 m)   Wt 236 lb 6.4 oz (107.2 kg)   SpO2 93% Comment: set to 3  BMI 35.94 kg/m  Wt Readings from Last 3 Encounters:  06/18/24 236 lb 6.4 oz (107.2 kg)  05/17/24 238 lb 1.6 oz (108 kg)  05/10/24 238 lb 12.8 oz (108.3 kg)    Lab Results  Component Value Date   TSH 0.971 01/30/2024  Lab Results  Component Value Date   WBC 8.9 01/30/2024   HGB 16.6 01/30/2024   HCT 50.7 01/30/2024   MCV 95 01/30/2024   PLT 219 01/30/2024   Lab Results  Component Value Date   NA 137 05/10/2024   K 4.3 05/10/2024   CO2 26 05/10/2024   GLUCOSE 76 05/10/2024   BUN 13 05/10/2024   CREATININE 0.83 05/10/2024   BILITOT 1.3 (H) 01/30/2024   ALKPHOS 96 01/30/2024   AST 19 01/30/2024   ALT 13 01/30/2024   PROT 7.2 01/30/2024    ALBUMIN 4.3 01/30/2024   CALCIUM  8.8 (L) 05/10/2024   ANIONGAP 12 05/10/2024   EGFR 100 01/30/2024   Lab Results  Component Value Date   CHOL 162 01/30/2024   Lab Results  Component Value Date   HDL 65 01/30/2024   Lab Results  Component Value Date   LDLCALC 85 01/30/2024   Lab Results  Component Value Date   TRIG 60 01/30/2024   Lab Results  Component Value Date   CHOLHDL 2.5 01/30/2024   Lab Results  Component Value Date   HGBA1C 5.6 01/30/2024      Assessment & Plan:   Problem List Items Addressed This Visit       Cardiovascular and Mediastinum   Hypertension   BP Readings from Last 1 Encounters:  06/18/24 113/70   Well-controlled with amlodipine  5 mg QD Counseled for compliance with the medications Advised DASH diet and moderate exercise/walking as tolerated        Respiratory   COPD (chronic obstructive pulmonary disease) (HCC) (Chronic)   On Trelegy and as needed albuterol  On Daliresp  Followed by pulmonology- Dr. Theophilus Was referred to [facility] for lung reduction surgery - but since his symptoms are improved with weight loss, it was deemed that he would not benefit any further      Chronic respiratory failure with hypoxia (HCC)   Doing well on room air at rest today, but keeps home O2 for PRN use, needs it upon ambulation Uses continuous home O2 at 3-4 LPM due to hypoxia at times        Other   Morbid obesity (HCC)   BMI Readings from Last 3 Encounters:  06/18/24 35.94 kg/m  05/17/24 35.16 kg/m  05/10/24 36.31 kg/m   Associated with HTN, HLD, prediabetes, OSA and MDD Diet modification and ambulation as tolerated Has lost about 25 lbs with Ozempic      Encounter for general adult medical examination with abnormal findings - Primary   Physical exam as documented. Counseling done  re healthy lifestyle involving commitment to 150 minutes exercise per week, heart healthy diet, and attaining healthy weight.The importance of adequate sleep  also discussed. Immunization and cancer screening needs are specifically addressed at this visit.      Other Visit Diagnoses       Encounter for immunization       Relevant Orders   Pneumococcal conjugate vaccine 20-valent (Completed)        No orders of the defined types were placed in this encounter.   Follow-up: Return in about 6 months (around 12/19/2024) for HTN and COPD.    Suzzane MARLA Blanch, MD

## 2024-06-18 NOTE — Assessment & Plan Note (Signed)
 BMI Readings from Last 3 Encounters:  06/18/24 35.94 kg/m  05/17/24 35.16 kg/m  05/10/24 36.31 kg/m   Associated with HTN, HLD, prediabetes, OSA and MDD Diet modification and ambulation as tolerated Has lost about 25 lbs with Ozempic

## 2024-06-18 NOTE — Assessment & Plan Note (Signed)
 Physical exam as documented. Counseling done  re healthy lifestyle involving commitment to 150 minutes exercise per week, heart healthy diet, and attaining healthy weight.The importance of adequate sleep also discussed. Immunization and cancer screening needs are specifically addressed at this visit.

## 2024-06-18 NOTE — Patient Instructions (Addendum)
Please continue to take medications as prescribed.  Please continue to follow low salt diet and perform moderate exercise/walking at least 150 mins/week. 

## 2024-06-22 ENCOUNTER — Encounter (HOSPITAL_COMMUNITY)
Admission: RE | Admit: 2024-06-22 | Discharge: 2024-06-22 | Disposition: A | Discharge status: Home / Self Care | Source: Ambulatory Visit | Attending: Pulmonary Disease

## 2024-06-22 DIAGNOSIS — J439 Emphysema, unspecified: Secondary | ICD-10-CM

## 2024-06-22 DIAGNOSIS — J4489 Other specified chronic obstructive pulmonary disease: Secondary | ICD-10-CM | POA: Diagnosis not present

## 2024-06-22 NOTE — Progress Notes (Signed)
 Daily Session Note  Patient Details  Name: Raife Lizer MRN: [id number] Date of Birth: [birth year] Referring Provider:   Flowsheet Row PULMONARY REHAB OTHER RESP ORIENTATION from 05/17/2024 in [facility] CARDIAC REHABILITATION  Referring Provider Theophilus Roosevelt MD    Encounter Date: 06/22/2024  Check In:  Session Check In - 06/22/24 1100       Check-In   Supervising physician immediately available to respond to emergencies See telemetry face sheet for immediately available MD    Location [facility]    Staff Present Powell Benders, BS, Exercise Physiologist;Jessica Vonzell, MA, RCEP, CCRP, CCET    Virtual Visit No    Medication changes reported     No    Fall or balance concerns reported    No    Tobacco Cessation No Change    Warm-up and Cool-down Performed on first and last piece of equipment    Resistance Training Performed Yes    VAD Patient? No    PAD/SET Patient? No      Pain Assessment   Currently in Pain? No/denies          Capillary Blood Glucose: No results found for this or any previous visit (from the past 24 hours).    Social History   Tobacco Use  Smoking Status Former   Current packs/day: 0.00   Average packs/day: 1 pack/day for 38.0 years (38.0 ttl pk-yrs)   Types: Cigarettes   Start date: 02/20/1982   Quit date: 02/21/2020   Years since quitting: 4.3  Smokeless Tobacco Never    Goals Met:  Proper associated with RPD/PD & O2 Sat Independence with exercise equipment Improved SOB with ADL's Using PLB without cueing & demonstrates good technique Exercise tolerated well No report of concerns or symptoms today Strength training completed today  Goals Unmet:  Not Applicable  Comments: Pt able to follow exercise prescription today without complaint.  Will continue to monitor for progression.

## 2024-06-24 ENCOUNTER — Encounter (HOSPITAL_COMMUNITY)
Admission: RE | Admit: 2024-06-24 | Discharge: 2024-06-24 | Disposition: A | Discharge status: Home / Self Care | Source: Ambulatory Visit | Attending: Pulmonary Disease

## 2024-06-24 DIAGNOSIS — J4489 Other specified chronic obstructive pulmonary disease: Secondary | ICD-10-CM

## 2024-06-24 DIAGNOSIS — J439 Emphysema, unspecified: Secondary | ICD-10-CM | POA: Diagnosis not present

## 2024-06-24 NOTE — Progress Notes (Signed)
 Daily Session Note  Patient Details  Name: Ricardo Burch MRN: [id number] Date of Birth: [birth year] Referring Provider:   Flowsheet Row PULMONARY REHAB OTHER RESP ORIENTATION from 05/17/2024 in [facility] CARDIAC REHABILITATION  Referring Provider Theophilus Roosevelt MD    Encounter Date: 06/24/2024  Check In:  Session Check In - 06/24/24 1015       Check-In   Supervising physician immediately available to respond to emergencies See telemetry face sheet for immediately available MD    Location [facility]    Staff Present Richerd Buddle, RN;Edder Bellanca Vicci, RN, BSN;Jessica Vonzell, MA, RCEP, CCRP, CCET;Heather Con, [place], Exercise Physiologist    Virtual Visit No    Medication changes reported     No    Fall or balance concerns reported    No    Warm-up and Cool-down Performed on first and last piece of equipment    Resistance Training Performed Yes    VAD Patient? No    PAD/SET Patient? No      Pain Assessment   Currently in Pain? No/denies    Pain Score 0-No pain    Multiple Pain Sites No          Capillary Blood Glucose: No results found for this or any previous visit (from the past 24 hours).    Social History   Tobacco Use  Smoking Status Former   Current packs/day: 0.00   Average packs/day: 1 pack/day for 38.0 years (38.0 ttl pk-yrs)   Types: Cigarettes   Start date: 02/20/1982   Quit date: 02/21/2020   Years since quitting: 4.3  Smokeless Tobacco Never    Goals Met:  Proper associated with RPD/PD & O2 Sat Independence with exercise equipment Using PLB without cueing & demonstrates good technique Exercise tolerated well No report of concerns or symptoms today Strength training completed today  Goals Unmet:  Not Applicable  Comments: Pt able to follow exercise prescription today without complaint.  Will continue to monitor for progression.

## 2024-06-29 ENCOUNTER — Encounter (HOSPITAL_COMMUNITY)
Admission: RE | Admit: 2024-06-29 | Discharge: 2024-06-29 | Disposition: A | Discharge status: Home / Self Care | Source: Ambulatory Visit | Attending: Pulmonary Disease | Admitting: Pulmonary Disease

## 2024-06-29 DIAGNOSIS — J4489 Other specified chronic obstructive pulmonary disease: Secondary | ICD-10-CM

## 2024-06-29 DIAGNOSIS — J439 Emphysema, unspecified: Secondary | ICD-10-CM | POA: Diagnosis not present

## 2024-06-29 NOTE — Progress Notes (Signed)
 Daily Session Note  Patient Details  Name: Jahid Weida MRN: [id number] Date of Birth: 1960/12/29 Referring Provider:   Flowsheet Row PULMONARY REHAB OTHER RESP ORIENTATION from 05/17/2024 in [facility] CARDIAC REHABILITATION  Referring Provider Theophilus Roosevelt MD    Encounter Date: 06/29/2024  Check In:  Session Check In - 06/29/24 1030       Check-In   Supervising physician immediately available to respond to emergencies See telemetry face sheet for immediately available MD    Location [facility]    Staff Present Richerd Buddle, RN;Debra Vicci, RN, BSN;Jessica Vonzell, MA, RCEP, CCRP, CCET;Heather [place], [place], Exercise Physiologist    Virtual Visit No    Medication changes reported     No    Fall or balance concerns reported    No    Tobacco Cessation No Change    Warm-up and Cool-down Performed on first and last piece of equipment    Resistance Training Performed Yes    VAD Patient? No    PAD/SET Patient? No      Pain Assessment   Currently in Pain? No/denies          Capillary Blood Glucose: No results found for this or any previous visit (from the past 24 hours).    Social History   Tobacco Use  Smoking Status Former   Current packs/day: 0.00   Average packs/day: 1 pack/day for 38.0 years (38.0 ttl pk-yrs)   Types: Cigarettes   Start date: 02/20/1982   Quit date: 02/21/2020   Years since quitting: 4.3  Smokeless Tobacco Never    Goals Met:  Proper associated with RPD/PD & O2 Sat Independence with exercise equipment Improved SOB with ADL's Using PLB without cueing & demonstrates good technique Exercise tolerated well No report of concerns or symptoms today Strength training completed today  Goals Unmet:  Not Applicable  Comments: Pt able to follow exercise prescription today without complaint.  Will continue to monitor for progression.

## 2024-07-01 ENCOUNTER — Encounter (HOSPITAL_COMMUNITY)
Admission: RE | Admit: 2024-07-01 | Discharge: 2024-07-01 | Disposition: A | Discharge status: Home / Self Care | Source: Ambulatory Visit | Attending: Pulmonary Disease

## 2024-07-01 DIAGNOSIS — J4489 Other specified chronic obstructive pulmonary disease: Secondary | ICD-10-CM | POA: Diagnosis not present

## 2024-07-01 DIAGNOSIS — J439 Emphysema, unspecified: Secondary | ICD-10-CM

## 2024-07-01 NOTE — Progress Notes (Signed)
 Daily Session Note  Patient Details  Name: Ricardo Burch MRN: [id number] Date of Birth: [birth year] Referring Provider:   Flowsheet Row PULMONARY REHAB OTHER RESP ORIENTATION from 05/17/2024 in [facility] CARDIAC REHABILITATION  Referring Provider Theophilus Roosevelt MD    Encounter Date: 07/01/2024  Check In:  Session Check In - 07/01/24 1050       Check-In   Supervising physician immediately available to respond to emergencies See telemetry face sheet for immediately available MD    Location [facility]    Staff Present Powell Benders, BS, Exercise Physiologist;Jessica Vonzell, MA, RCEP, CCRP, CCET;Victoria Zina, RN    Virtual Visit No    Medication changes reported     No    Fall or balance concerns reported    No    Tobacco Cessation No Change    Warm-up and Cool-down Performed on first and last piece of equipment    Resistance Training Performed Yes    VAD Patient? No    PAD/SET Patient? No      Pain Assessment   Currently in Pain? No/denies    Pain Score 0-No pain    Multiple Pain Sites No          Capillary Blood Glucose: No results found for this or any previous visit (from the past 24 hours).    Social History   Tobacco Use  Smoking Status Former   Current packs/day: 0.00   Average packs/day: 1 pack/day for 38.0 years (38.0 ttl pk-yrs)   Types: Cigarettes   Start date: 02/20/1982   Quit date: 02/21/2020   Years since quitting: 4.3  Smokeless Tobacco Never    Goals Met:  Independence with exercise equipment Exercise tolerated well No report of concerns or symptoms today Strength training completed today  Goals Unmet:  Not Applicable  Comments: Pt able to follow exercise prescription today without complaint.  Will continue to monitor for progression.

## 2024-07-02 ENCOUNTER — Ambulatory Visit

## 2024-07-02 ENCOUNTER — Other Ambulatory Visit: Payer: Self-pay | Admitting: Internal Medicine

## 2024-07-02 ENCOUNTER — Ambulatory Visit: Payer: Self-pay | Admitting: Internal Medicine

## 2024-07-02 VITALS — BP 114/73 | HR 84 | Temp 99.1°F | Ht 69.0 in | Wt 236.1 lb

## 2024-07-02 DIAGNOSIS — J069 Acute upper respiratory infection, unspecified: Secondary | ICD-10-CM

## 2024-07-02 DIAGNOSIS — J3089 Other allergic rhinitis: Secondary | ICD-10-CM

## 2024-07-02 MED ORDER — AZITHROMYCIN 250 MG PO TABS: ORAL_TABLET | ORAL | 0 refills | Status: AC

## 2024-07-02 MED ORDER — METHYLPREDNISOLONE 4 MG PO TBPK: ORAL_TABLET | ORAL | 0 refills | Status: DC

## 2024-07-02 MED ORDER — LEVOCETIRIZINE DIHYDROCHLORIDE 5 MG PO TABS: 5.0000 mg | ORAL_TABLET | Freq: Every evening | ORAL | 5 refills | Status: DC

## 2024-07-02 NOTE — Telephone Encounter (Signed)
 FYI Only or Action Required?: FYI only for provider.  Patient was last seen in primary care on 06/18/2024 by Tobie Suzzane POUR, MD.  Called Nurse Triage reporting Nasal Congestion.  Symptoms began several days ago.  Symptoms are: gradually worsening.  Triage Disposition: See HCP Within 4 Hours (Or PCP Triage)  Patient/caregiver understands and will follow disposition?: Yes            Copied from CRM #[id number]. Topic: Clinical - Medication Question >> Jul 02, 2024  8:10 AM Dawna HERO wrote: Reason for CRM: runny nose , itchy diania eyes , possible allergy issue wanted to know if he could get prescribed some medication for it. Looked for appt times but nun available for today , patient asked if I could send over a message Reason for Disposition  [1] MILD difficulty breathing (e.g., minimal/no SOB at rest, SOB with walking, pulse < 100) AND [2] NEW-onset or WORSE than normal  Answer Assessment - Initial Assessment Questions This RN scheduled pt an appt today in office with a different provider as PCP has no appts today.    Pt states he sleeps with a CPAP machine and thinks he has post-nasal drip after sleeping at night Eyes watering- L eye has crust and is itching, redness  Left ear itching Nose is running (light yellow in color)  ONSET: When did the nasal discharge start?      2-3 days ago (started on Tues)  COUGH: Do you have a cough? If Yes, ask: Describe the color of your mucus. (e.g., clear, white, yellow, green)     Denies a cough but states he has to make himself cough to clear chest with congestion  RESPIRATORY DISTRESS: Describe your breathing.      Breathing feels different; pt felt a burning sensation in chest and lungs; SOB with movement; pt uses 3L of oxygen  at home (he wears it intermittently); 86% right now with no oxygen , 93%-94% with 3L of oxygen   FEVER: Do you have a fever? If Yes, ask: What is your temperature, how was it measured, and when did  it start?     Hasn't checked but does not feel warm right now   OTHER SYMPTOMS: Do you have any other symptoms? (e.g., earache, mouth sores, sore throat, wheezing)     Had a sore throat and wheezing on Tues (not anymore)  Denies chest pain, body aches  Protocols used: Common Cold-A-AH, Breathing Difficulty-A-AH

## 2024-07-02 NOTE — Progress Notes (Signed)
 Established Patient Office Visit  Subjective   Patient ID: Ricardo Burch, male    DOB: 09-17-1961  Age: 63 y.o. MRN: [id number]  Chief Complaint  Patient presents with   Medical Management of Chronic Issues    Symptoms started Monday, itchy and runny eyes nose runnning no cough or chills felt warm the other day and feels a burning in his lungs, o2 has been low recently but any other time he does things he normally does gets him really winded    HPI   Patient Active Problem List   Diagnosis Date Noted   Vitamin D  deficiency 02/11/2024   Prostate cancer screening 04/10/2023   Chronic constipation 04/10/2023   Recurrent acute serous otitis media of left ear 08/15/2022   Excessive ear wax, left 07/22/2022   Need for immunization against influenza 07/09/2022   Encounter for general adult medical examination with abnormal findings 03/05/2022   Chronic fatigue 11/28/2021   DDD (degenerative disc disease), lumbar 11/28/2021   Lung nodule 08/30/2021   Pulmonary hypertension, moderate to severe (HCC) 08/28/2021   GERD (gastroesophageal reflux disease) 01/25/2020   Bloating 01/25/2020   Morbid obesity (HCC) 12/06/2019   Erectile dysfunction 08/18/2019   Chronic diastolic CHF (congestive heart failure) (HCC) 09/23/2018   Hyperlipidemia 01/12/2018   Hypertension 10/14/2017   Aortic atherosclerosis (HCC) 09/15/2017   Pre-diabetes 09/15/2017   Cocaine abuse (HCC) 07/26/2016   MDD (major depressive disorder), recurrent, in partial remission (HCC) 07/26/2016   OSA on CPAP 07/26/2016   Alcohol abuse 06/27/2016   COPD (chronic obstructive pulmonary disease) (HCC) 08/23/2014   Eczema 08/23/2014   Right knee pain 08/23/2014   Tobacco abuse 03/27/2014   Chronic respiratory failure with hypoxia (HCC) 03/27/2014   COPD exacerbation (HCC) 11/19/2013    ROS    Objective:     BP 114/73   Pulse 84   Temp 99.1 F (37.3 C) (Oral)   Ht 5' 9 (1.753 m)   Wt 236 lb 1.3 oz (107.1 kg)    SpO2 (!) 80%   BMI 34.86 kg/m  BP Readings from Last 3 Encounters:  07/02/24 114/73  06/18/24 113/70  05/12/24 97/68   Wt Readings from Last 3 Encounters:  07/02/24 236 lb 1.3 oz (107.1 kg)  06/18/24 236 lb 6.4 oz (107.2 kg)  05/17/24 238 lb 1.6 oz (108 kg)     Physical Exam Vitals and nursing note reviewed.  Constitutional:      Appearance: Normal appearance.  HENT:     Head: Normocephalic.     Right Ear: Tympanic membrane, ear canal and external ear normal.     Left Ear: Tympanic membrane, ear canal and external ear normal.     Nose: Congestion and rhinorrhea present.     Right Turbinates: Enlarged.     Left Turbinates: Enlarged.     Right Sinus: No maxillary sinus tenderness or frontal sinus tenderness.     Left Sinus: No maxillary sinus tenderness or frontal sinus tenderness.     Mouth/Throat:     Mouth: Mucous membranes are moist.     Pharynx: Oropharynx is clear.  Eyes:     Extraocular Movements: Extraocular movements intact.     Pupils: Pupils are equal, round, and reactive to light.  Cardiovascular:     Rate and Rhythm: Normal rate and regular rhythm.  Pulmonary:     Effort: Pulmonary effort is normal.     Breath sounds: Normal breath sounds.  Musculoskeletal:     Cervical back: Normal range  of motion and neck supple.  Skin:    General: Skin is warm and dry.  Neurological:     Mental Status: He is alert and oriented to person, place, and time.  Psychiatric:        Mood and Affect: Mood normal.        Thought Content: Thought content normal.     No results found for any visits on 07/02/24.    The 10-year ASCVD risk score (Arnett DK, et al., 2019) is: 9.5%    Assessment & Plan:   Problem List Items Addressed This Visit   None Visit Diagnoses       Upper respiratory tract infection, unspecified type    -  Primary   Relevant Medications   azithromycin  (ZITHROMAX ) 250 MG tablet   methylPREDNISolone  (MEDROL  DOSEPAK) 4 MG TBPK tablet       No  follow-ups on file.    Leita Longs, FNP

## 2024-07-02 NOTE — Telephone Encounter (Signed)
Patient being seen in office

## 2024-07-05 DIAGNOSIS — G4733 Obstructive sleep apnea (adult) (pediatric): Secondary | ICD-10-CM | POA: Diagnosis not present

## 2024-07-05 DIAGNOSIS — J449 Chronic obstructive pulmonary disease, unspecified: Secondary | ICD-10-CM | POA: Diagnosis not present

## 2024-07-06 ENCOUNTER — Encounter (HOSPITAL_COMMUNITY)
Admission: RE | Admit: 2024-07-06 | Discharge: 2024-07-06 | Disposition: A | Discharge status: Home / Self Care | Source: Ambulatory Visit | Attending: Pulmonary Disease | Admitting: Pulmonary Disease

## 2024-07-06 DIAGNOSIS — J439 Emphysema, unspecified: Secondary | ICD-10-CM | POA: Insufficient documentation

## 2024-07-06 DIAGNOSIS — J4489 Other specified chronic obstructive pulmonary disease: Secondary | ICD-10-CM | POA: Diagnosis not present

## 2024-07-06 DIAGNOSIS — J449 Chronic obstructive pulmonary disease, unspecified: Secondary | ICD-10-CM | POA: Diagnosis not present

## 2024-07-06 NOTE — Progress Notes (Signed)
 Daily Session Note  Patient Details  Name: Ricardo Burch MRN: [id number] Date of Birth: 1961/06/14 Referring Provider:   Flowsheet Row PULMONARY REHAB OTHER RESP ORIENTATION from 05/17/2024 in [facility] CARDIAC REHABILITATION  Referring Provider Theophilus Roosevelt MD    Encounter Date: 07/06/2024  Check In:  Session Check In - 07/06/24 1033       Check-In   Supervising physician immediately available to respond to emergencies See telemetry face sheet for immediately available MD    Location [facility]    Staff Present Laymon Rattler, BSN, RN, WTA-C;Heather Con, BS, Exercise Physiologist;Jessica Vonzell, MA, RCEP, CCRP, CCET    Virtual Visit No    Medication changes reported     Yes    Comments Started Prednisone , Zyrtec, and an antibiotic for his allergies.    Fall or balance concerns reported    No    Tobacco Cessation No Change    Warm-up and Cool-down Performed on first and last piece of equipment    Resistance Training Performed Yes    VAD Patient? No    PAD/SET Patient? No      Pain Assessment   Currently in Pain? No/denies          Capillary Blood Glucose: No results found for this or any previous visit (from the past 24 hours).    Social History   Tobacco Use  Smoking Status Former   Current packs/day: 0.00   Average packs/day: 1 pack/day for 38.0 years (38.0 ttl pk-yrs)   Types: Cigarettes   Start date: 02/20/1982   Quit date: 02/21/2020   Years since quitting: 4.3  Smokeless Tobacco Never    Goals Met:  Proper associated with RPD/PD & O2 Sat Independence with exercise equipment Improved SOB with ADL's Using PLB without cueing & demonstrates good technique Exercise tolerated well No report of concerns or symptoms today Strength training completed today  Goals Unmet:  Not Applicable  Comments: Pt able to follow exercise prescription today without complaint.  Will continue to monitor for progression.

## 2024-07-08 ENCOUNTER — Encounter (HOSPITAL_COMMUNITY)

## 2024-07-12 ENCOUNTER — Other Ambulatory Visit: Payer: Self-pay | Admitting: Internal Medicine

## 2024-07-13 ENCOUNTER — Encounter (HOSPITAL_COMMUNITY)
Admission: RE | Admit: 2024-07-13 | Discharge: 2024-07-13 | Disposition: A | Discharge status: Home / Self Care | Source: Ambulatory Visit | Attending: Pulmonary Disease | Admitting: Pulmonary Disease

## 2024-07-13 DIAGNOSIS — J4489 Other specified chronic obstructive pulmonary disease: Secondary | ICD-10-CM | POA: Diagnosis not present

## 2024-07-13 DIAGNOSIS — J439 Emphysema, unspecified: Secondary | ICD-10-CM

## 2024-07-13 NOTE — Progress Notes (Signed)
 Daily Session Note  Patient Details  Name: Deforest Maiden MRN: [id number] Date of Birth: August 27, 1961 Referring Provider:   Flowsheet Row PULMONARY REHAB OTHER RESP ORIENTATION from 05/17/2024 in [facility] CARDIAC REHABILITATION  Referring Provider Theophilus Roosevelt MD    Encounter Date: 07/13/2024  Check In:  Session Check In - 07/13/24 1042       Check-In   Supervising physician immediately available to respond to emergencies See telemetry face sheet for immediately available MD    Location [facility]    Staff Present Powell Benders, BS, Exercise Physiologist;Brittany Jackquline, BSN, RN, WTA-C;Shayleigh Bouldin Zina, RN    Virtual Visit No    Medication changes reported     No    Fall or balance concerns reported    No    Warm-up and Cool-down Performed on first and last piece of equipment    Resistance Training Performed Yes    VAD Patient? No    PAD/SET Patient? No      Pain Assessment   Currently in Pain? No/denies          Capillary Blood Glucose: No results found for this or any previous visit (from the past 24 hours).    Social History   Tobacco Use  Smoking Status Former   Current packs/day: 0.00   Average packs/day: 1 pack/day for 38.0 years (38.0 ttl pk-yrs)   Types: Cigarettes   Start date: 02/20/1982   Quit date: 02/21/2020   Years since quitting: 4.3  Smokeless Tobacco Never    Goals Met:  Proper associated with RPD/PD & O2 Sat Independence with exercise equipment Using PLB without cueing & demonstrates good technique Exercise tolerated well No report of concerns or symptoms today Strength training completed today  Goals Unmet:  Not Applicable  Comments: Pt able to follow exercise prescription today without complaint.  Will continue to monitor for progression.

## 2024-07-14 ENCOUNTER — Encounter (HOSPITAL_COMMUNITY): Payer: Self-pay | Admitting: *Deleted

## 2024-07-14 DIAGNOSIS — J439 Emphysema, unspecified: Secondary | ICD-10-CM

## 2024-07-14 NOTE — Progress Notes (Signed)
 Pulmonary Individual Treatment Plan  Patient Details  Name: Ricardo Burch MRN: [id number] Date of Birth: May 01, 1961 Referring Provider:   Flowsheet Row PULMONARY REHAB OTHER RESP ORIENTATION from 05/17/2024 in [facility] CARDIAC REHABILITATION  Referring Provider Theophilus Roosevelt MD    Initial Encounter Date:  Flowsheet Row PULMONARY REHAB OTHER RESP ORIENTATION from 05/17/2024 in [facility] CARDIAC REHABILITATION  Date 05/17/24    Visit Diagnosis: COPD with chronic bronchitis and emphysema (HCC)  Patient's Home Medications on Admission:   Current Outpatient Medications:    albuterol  (VENTOLIN  HFA) 108 (90 Base) MCG/ACT inhaler, INHALE 1-2 PUFFS BY MOUTH EVERY 6 HOURS AS NEEDED FOR WHEEZING AND SHORTNESS OF BREATH, Disp: 8.5 g, Rfl: 11   amLODipine  (NORVASC ) 5 MG tablet, TAKE 1 TABLET BY MOUTH DAILY, Disp: 30 tablet, Rfl: 11   fluticasone  (FLONASE ) 50 MCG/ACT nasal spray, INSTILL TWO (2) SPRAYS IN EACH NOSTRIL TWICE DAILY, Disp: 16 g, Rfl: 11   furosemide  (LASIX ) 20 MG tablet, Take 20 mg by mouth daily., Disp: , Rfl:    levocetirizine (XYZAL ) 5 MG tablet, Take 1 tablet (5 mg total) by mouth every evening., Disp: 30 tablet, Rfl: 5   methylPREDNISolone  (MEDROL  DOSEPAK) 4 MG TBPK tablet, Take as package instructions., Disp: 1 each, Rfl: 0   OXYGEN , Inhale 3 L into the lungs daily. continuous, Disp: , Rfl:    OZEMPIC, 1 MG/DOSE, 4 MG/3ML SOPN, SMARTSIG:0.75 Milliliter(s) SUB-Q Once a Week, Disp: , Rfl:    roflumilast  (DALIRESP ) 500 MCG TABS tablet, TAKE 1 TABLET ( ) BY MOUTH DAILY, Disp: 30 tablet, Rfl: 11   sildenafil  (VIAGRA ) 50 MG tablet, Take 1 tablet (50 mg total) by mouth daily as needed for erectile dysfunction., Disp: 30 tablet, Rfl: 1   spironolactone (ALDACTONE) 25 MG tablet, Take 25 mg by mouth daily., Disp: , Rfl:    TRELEGY ELLIPTA  200-62.5-25 MCG/ACT AEPB, INHALE ONE (1) PUFF BY MOUTH DAILY, Disp: 60 each, Rfl: 10  Past Medical History: Past Medical History:  Diagnosis  Date   Allergy    CHF (congestive heart failure) (HCC)    HFpEF   Chronic kidney disease    COPD (chronic obstructive pulmonary disease) (HCC) Dx 2015   Depression    Eczema    since childhood    Emphysema of lung (HCC)    Erectile dysfunction 08/18/2019   GERD (gastroesophageal reflux disease) 01/25/2020   Hyperlipidemia 01/12/2018   Hypertension    Oxygen  deficiency    Screening for HIV (human immunodeficiency virus)    Sleep apnea    CPAP   Substance abuse (HCC)    MJ, cocaine   Testicular failure 07/22/2019    Tobacco Use: Social History   Tobacco Use  Smoking Status Former   Current packs/day: 0.00   Average packs/day: 1 pack/day for 38.0 years (38.0 ttl pk-yrs)   Types: Cigarettes   Start date: 02/20/1982   Quit date: 02/21/2020   Years since quitting: 4.3  Smokeless Tobacco Never    Labs: Review Flowsheet  More data exists      Latest Ref Rng & Units 10/25/2020 05/16/2022 11/20/2022 05/19/2023 01/30/2024  Labs for ITP Cardiac and Pulmonary Rehab  Cholestrol 100 - 199 mg/dL 842  843  839  849  837   LDL (calc) 0 - 99 mg/dL 86  94  99  87  85   HDL-C >39 mg/dL 56  51  47  52  65   Trlycerides 0 - 149 mg/dL 64  52  70  49  60   Hemoglobin A1c 4.8 - 5.6 % 5.8  5.7  5.6  5.7  5.6     Capillary Blood Glucose: Lab Results  Component Value Date   GLUCAP 85 05/12/2024   GLUCAP 84 11/20/2019   GLUCAP 93 05/12/2018   GLUCAP 179 (H) 07/27/2016   GLUCAP 156 (H) 07/27/2016     Pulmonary Assessment Scores:  Pulmonary Assessment Scores     Row Name 05/11/24 1319         ADL UCSD   ADL Phase Entry     SOB Score total 39     Rest 0     Walk 3     Stairs 3     Bath 2     Dress 2     Shop 2       CAT Score   CAT Score 14       UCSD: Self-administered rating of dyspnea associated with activities of daily living (ADLs) 6-point scale (0 = not at all to 5 = maximal or unable to do because of breathlessness)  Scoring Scores range from 0 to 120.   Minimally important difference is 5 units  CAT: CAT can identify the health impairment of COPD patients and is better correlated with disease progression.  CAT has a scoring range of zero to 40. The CAT score is classified into four groups of low (less than 10), medium (10 - 20), high (21-30) and very high (31-40) based on the impact level of disease on health status. A CAT score over 10 suggests significant symptoms.  A worsening CAT score could be explained by an exacerbation, poor medication adherence, poor inhaler technique, or progression of COPD or comorbid conditions.  CAT MCID is 2 points  mMRC: mMRC (Modified Medical Research Council) Dyspnea Scale is used to assess the degree of baseline functional disability in patients of respiratory disease due to dyspnea. No minimal important difference is established. A decrease in score of 1 point or greater is considered a positive change.   Pulmonary Function Assessment:  Pulmonary Function Assessment - 05/11/24 1311       Pulmonary Function Tests   FVC% 56 %    FEV1% 37 %    FEV1/FVC Ratio 61    DLCO% 50 %          Exercise Target Goals: Exercise Program Goal: Individual exercise prescription set using results from initial 6 min walk test and THRR while considering  patient's activity barriers and safety.   Exercise Prescription Goal: Initial exercise prescription builds to 30-45 minutes a day of aerobic activity, 2-3 days per week.  Home exercise guidelines will be given to patient during program as part of exercise prescription that the participant will acknowledge.  Activity Barriers & Risk Stratification:  Activity Barriers & Cardiac Risk Stratification - 05/11/24 1304       Activity Barriers & Cardiac Risk Stratification   Activity Barriers Muscular Weakness;Deconditioning;Shortness of Breath          6 Minute Walk:  6 Minute Walk     Row Name 05/17/24 1052         6 Minute Walk   Phase Initial     Distance  570 feet     Walk Time 3.46 minutes     # of Rest Breaks 0     MPH 1.87     METS 1.8     RPE 13     Perceived Dyspnea  3  VO2 Peak 6.3     Symptoms Yes (comment)     Comments pt o2 dropped 74 at 3.46 min test stopped     Resting HR 74 bpm     Resting BP 112/78     Resting Oxygen  Saturation  90 %     Exercise Oxygen  Saturation  during 6 min walk 74 %     Max Ex. HR 93 bpm     Max Ex. BP 150/70     2 Minute Post BP 126/70       Interval HR   1 Minute HR 91     2 Minute HR 87     3 Minute HR 93     6 Minute HR 74     2 Minute Post HR 71     Interval Heart Rate? Yes       Interval Oxygen    Interval Oxygen ? Yes     Baseline Oxygen  Saturation % 90 %     1 Minute Oxygen  Saturation % 90 %     1 Minute Liters of Oxygen  0 L     2 Minute Oxygen  Saturation % 86 %     2 Minute Liters of Oxygen  0 L     3 Minute Oxygen  Saturation % 74 %     3 Minute Liters of Oxygen  0 L     6 Minute Oxygen  Saturation % 87 %     6 Minute Liters of Oxygen  0 L     2 Minute Post Oxygen  Saturation % 88 %     2 Minute Post Liters of Oxygen  0 L        Oxygen  Initial Assessment:  Oxygen  Initial Assessment - 05/11/24 1310       Home Oxygen    Home Oxygen  Device Portable Concentrator;Home Concentrator    Sleep Oxygen  Prescription CPAP    Liters per minute 3    Home Exercise Oxygen  Prescription Continuous    Liters per minute 3    Home Resting Oxygen  Prescription Continuous    Liters per minute 3    Compliance with Home Oxygen  Use Yes      Intervention   Short Term Goals To learn and exhibit compliance with exercise, home and travel O2 prescription;To learn and understand importance of monitoring SPO2 with pulse oximeter and demonstrate accurate use of the pulse oximeter.;To learn and understand importance of maintaining oxygen  saturations>88%;To learn and demonstrate proper pursed lip breathing techniques or other breathing techniques. ;To learn and demonstrate proper use of respiratory medications     Long  Term Goals Exhibits compliance with exercise, home  and travel O2 prescription;Verbalizes importance of monitoring SPO2 with pulse oximeter and return demonstration;Maintenance of O2 saturations>88%;Exhibits proper breathing techniques, such as pursed lip breathing or other method taught during program session;Compliance with respiratory medication;Demonstrates proper use of MDI's          Oxygen  Re-Evaluation:  Oxygen  Re-Evaluation     Row Name 06/08/24 1038 07/01/24 1200           Program Oxygen  Prescription   Program Oxygen  Prescription -- Continuous      Liters per minute -- 3        Home Oxygen    Home Oxygen  Device -- Portable Concentrator;Home Concentrator;E-Tanks      Sleep Oxygen  Prescription -- Continuous;CPAP      Liters per minute -- 3      Home Exercise Oxygen  Prescription -- Continuous      Liters per minute --  3      Home Resting Oxygen  Prescription -- Continuous      Liters per minute -- 3        Goals/Expected Outcomes   Short Term Goals -- To learn and exhibit compliance with exercise, home and travel O2 prescription;To learn and understand importance of monitoring SPO2 with pulse oximeter and demonstrate accurate use of the pulse oximeter.;To learn and understand importance of maintaining oxygen  saturations>88%;To learn and demonstrate proper pursed lip breathing techniques or other breathing techniques. ;To learn and demonstrate proper use of respiratory medications      Long  Term Goals -- Exhibits compliance with exercise, home  and travel O2 prescription;Verbalizes importance of monitoring SPO2 with pulse oximeter and return demonstration;Exhibits proper breathing techniques, such as pursed lip breathing or other method taught during program session;Maintenance of O2 saturations>88%;Compliance with respiratory medication;Demonstrates proper use of MDI's      Comments Reviewed PLB technique with pt.  Talked about how it works and it's importance in  maintaining their exercise saturations. Dshawn continues to keep a check on his oxygen  saturations at home. He wears his cpap at bedtime. He notes he breathing is good here and at home, noting when he feels exerted he stops and does his PLB techniques.      Goals/Expected Outcomes Short: Become more profiecient at using PLB.   Long: Become independent at using PLB. Short: continue to attand rehab. Long: continue using PLB techniques when needed.         Oxygen  Discharge (Final Oxygen  Re-Evaluation):  Oxygen  Re-Evaluation - 07/01/24 1200       Program Oxygen  Prescription   Program Oxygen  Prescription Continuous    Liters per minute 3      Home Oxygen    Home Oxygen  Device Portable Concentrator;Home Concentrator;E-Tanks    Sleep Oxygen  Prescription Continuous;CPAP    Liters per minute 3    Home Exercise Oxygen  Prescription Continuous    Liters per minute 3    Home Resting Oxygen  Prescription Continuous    Liters per minute 3      Goals/Expected Outcomes   Short Term Goals To learn and exhibit compliance with exercise, home and travel O2 prescription;To learn and understand importance of monitoring SPO2 with pulse oximeter and demonstrate accurate use of the pulse oximeter.;To learn and understand importance of maintaining oxygen  saturations>88%;To learn and demonstrate proper pursed lip breathing techniques or other breathing techniques. ;To learn and demonstrate proper use of respiratory medications    Long  Term Goals Exhibits compliance with exercise, home  and travel O2 prescription;Verbalizes importance of monitoring SPO2 with pulse oximeter and return demonstration;Exhibits proper breathing techniques, such as pursed lip breathing or other method taught during program session;Maintenance of O2 saturations>88%;Compliance with respiratory medication;Demonstrates proper use of MDI's    Comments Madeline continues to keep a check on his oxygen  saturations at home. He wears his cpap at bedtime. He  notes he breathing is good here and at home, noting when he feels exerted he stops and does his PLB techniques.    Goals/Expected Outcomes Short: continue to attand rehab. Long: continue using PLB techniques when needed.          Initial Exercise Prescription:  Initial Exercise Prescription - 05/17/24 1000       Date of Initial Exercise RX and Referring Provider   Date 05/17/24    Referring Provider Mannam, Praveen MD      Oxygen    Oxygen  Continuous    Liters 3    Maintain  Oxygen  Saturation 88% or higher      Treadmill   MPH 1.2    Grade 0    Minutes 15    METs 1.92      REL-XR   Level 1    Speed 50    Minutes 15    METs 1.9      Prescription Details   Frequency (times per week) 2    Duration Progress to 30 minutes of continuous aerobic without signs/symptoms of physical distress      Intensity   THRR 40-80% of Max Heartrate 108-143    Ratings of Perceived Exertion 11-13    Perceived Dyspnea 0-4      Resistance Training   Training Prescription Yes    Weight 7    Reps 10-15          Perform Capillary Blood Glucose checks as needed.  Exercise Prescription Changes:   Exercise Prescription Changes     Row Name 05/17/24 1100 06/29/24 1500           Response to Exercise   Blood Pressure (Admit) 112/78 105/70      Blood Pressure (Exercise) 150/70 134/76      Blood Pressure (Exit) 126/70 118/60      Heart Rate (Admit) 74 bpm 78 bpm      Heart Rate (Exercise) 93 bpm 100 bpm      Heart Rate (Exit) 71 bpm 77 bpm      Oxygen  Saturation (Admit) 90 % 91 %      Oxygen  Saturation (Exercise) 74 % 87 %      Oxygen  Saturation (Exit) 88 % 94 %      Rating of Perceived Exertion (Exercise) 13 12      Perceived Dyspnea (Exercise) 3 2      Duration Progress to 30 minutes of  aerobic without signs/symptoms of physical distress Continue with 30 min of aerobic exercise without signs/symptoms of physical distress.      Intensity THRR unchanged THRR unchanged         Progression   Progression -- Continue to progress workloads to maintain intensity without signs/symptoms of physical distress.        Resistance Training   Training Prescription -- Yes      Weight -- 7      Reps -- 10-15        Oxygen    Oxygen  -- Continuous      Liters -- 4        Treadmill   MPH -- 1.7      Grade -- 0      Minutes -- 15      METs -- 2.3        REL-XR   Level -- 1      Speed -- 46      Minutes -- 15      METs -- 2.1        Oxygen    Maintain Oxygen  Saturation -- 88% or higher         Exercise Comments:   Exercise Comments     Row Name 05/11/24 1310 05/17/24 1029 06/08/24 1037       Exercise Comments Joseff currently doesn't do any home exercise besides ADL's and walking his dog. Patient attend orientation today.  Patient is attending Pulmonary Rehabilitation Program.  Documentation for diagnosis can be found in [facility].  Reviewed medical chart, RPE/RPD, gym safety, and program guidelines.  Patient was fitted to equipment they will be using during  rehab.  Patient is scheduled to start exercise on 06/08/24.   Initial ITP created and sent for review and signature by Dr. Anton Kelp, Medical Director for Pulmonary Rehabilitation Program. First full day of exercise!  Patient was oriented to gym and equipment including functions, settings, policies, and procedures.  Patient's individual exercise prescription and treatment plan were reviewed.  All starting workloads were established based on the results of the 6 minute walk test done at initial orientation visit.  The plan for exercise progression was also introduced and progression will be customized based on patient's performance and goals.        Exercise Goals and Review:   Exercise Goals     Row Name 05/11/24 1309             Exercise Goals   Increase Physical Activity Yes       Intervention Provide advice, education, support and counseling about physical activity/exercise needs.;Develop an individualized  exercise prescription for aerobic and resistive training based on initial evaluation findings, risk stratification, comorbidities and participant's personal goals.       Expected Outcomes Short Term: Attend rehab on a regular basis to increase amount of physical activity.;Long Term: Add in home exercise to make exercise part of routine and to increase amount of physical activity.;Long Term: Exercising regularly at least 3-5 days a week.       Increase Strength and Stamina Yes       Intervention Provide advice, education, support and counseling about physical activity/exercise needs.;Develop an individualized exercise prescription for aerobic and resistive training based on initial evaluation findings, risk stratification, comorbidities and participant's personal goals.       Expected Outcomes Short Term: Increase workloads from initial exercise prescription for resistance, speed, and METs.;Short Term: Perform resistance training exercises routinely during rehab and add in resistance training at home;Long Term: Improve cardiorespiratory fitness, muscular endurance and strength as measured by increased METs and functional capacity ( )       Able to understand and use rate of perceived exertion (RPE) scale Yes       Intervention Provide education and explanation on how to use RPE scale       Expected Outcomes Short Term: Able to use RPE daily in rehab to express subjective intensity level;Long Term:  Able to use RPE to guide intensity level when exercising independently       Able to understand and use Dyspnea scale Yes       Intervention Provide education and explanation on how to use Dyspnea scale       Expected Outcomes Short Term: Able to use Dyspnea scale daily in rehab to express subjective sense of shortness of breath during exertion;Long Term: Able to use Dyspnea scale to guide intensity level when exercising independently       Knowledge and understanding of Target Heart Rate Range (THRR) Yes        Intervention Provide education and explanation of THRR including how the numbers were predicted and where they are located for reference       Expected Outcomes Short Term: Able to state/look up THRR;Short Term: Able to use daily as guideline for intensity in rehab;Long Term: Able to use THRR to govern intensity when exercising independently       Able to check pulse independently Yes       Intervention Provide education and demonstration on how to check pulse in carotid and radial arteries.;Review the importance of being able to check your own pulse for  safety during independent exercise       Expected Outcomes Short Term: Able to explain why pulse checking is important during independent exercise;Long Term: Able to check pulse independently and accurately       Understanding of Exercise Prescription Yes       Intervention Provide education, explanation, and written materials on patient's individual exercise prescription       Expected Outcomes Short Term: Able to explain program exercise prescription;Long Term: Able to explain home exercise prescription to exercise independently          Exercise Goals Re-Evaluation :  Exercise Goals Re-Evaluation     Row Name 06/08/24 1037 07/01/24 0941 07/01/24 1155         Exercise Goal Re-Evaluation   Exercise Goals Review Able to understand and use rate of perceived exertion (RPE) scale;Knowledge and understanding of Target Heart Rate Range (THRR) Increase Physical Activity;Increase Strength and Stamina;Understanding of Exercise Prescription Increase Physical Activity;Increase Strength and Stamina;Able to understand and use Dyspnea scale;Understanding of Exercise Prescription;Knowledge and understanding of Target Heart Rate Range (THRR);Able to understand and use rate of perceived exertion (RPE) scale     Comments Reviewed RPE and dyspnea scale, THR and program prescription with pt today.  Pt voiced understanding and was given a copy of goals to take  home. Dyke has completed 7 sessions of rehab. He has been doing well with the program . He has had to increase his oxygen  to 4L during exercise due to excerting hisself. Will continue to montior and progress as able Deep is doing great in rehab. He notes he doesn't really exercise at home but he does do lots of things around the house. Spoke with him about maybe starting to walk his street, even with his dog, also did tell him to make sure the weather was ok when he did so his breathing would be ok.     Expected Outcomes Short: Use RPE daily to regulate intensity.  Long: Follow program prescription in THR. Short: contiiue to increase levels and speed on treadmill   long: continue to attend rehab Short: continue to attend rehab. Long: walk more at home.        Discharge Exercise Prescription (Final Exercise Prescription Changes):  Exercise Prescription Changes - 06/29/24 1500       Response to Exercise   Blood Pressure (Admit) 105/70    Blood Pressure (Exercise) 134/76    Blood Pressure (Exit) 118/60    Heart Rate (Admit) 78 bpm    Heart Rate (Exercise) 100 bpm    Heart Rate (Exit) 77 bpm    Oxygen  Saturation (Admit) 91 %    Oxygen  Saturation (Exercise) 87 %    Oxygen  Saturation (Exit) 94 %    Rating of Perceived Exertion (Exercise) 12    Perceived Dyspnea (Exercise) 2    Duration Continue with 30 min of aerobic exercise without signs/symptoms of physical distress.    Intensity THRR unchanged      Progression   Progression Continue to progress workloads to maintain intensity without signs/symptoms of physical distress.      Resistance Training   Training Prescription Yes    Weight 7    Reps 10-15      Oxygen    Oxygen  Continuous    Liters 4      Treadmill   MPH 1.7    Grade 0    Minutes 15    METs 2.3      REL-XR   Level 1  Speed 46    Minutes 15    METs 2.1      Oxygen    Maintain Oxygen  Saturation 88% or higher          Nutrition:  Target Goals: Understanding  of nutrition guidelines, daily intake of sodium 1500mg , cholesterol 200mg , calories 30% from fat and 7% or less from saturated fats, daily to have 5 or more servings of fruits and vegetables.  Biometrics:  Pre Biometrics - 05/17/24 1102       Pre Biometrics   Height 5' 9 (1.753 m)    Weight 238 lb 1.6 oz (108 kg)    Waist Circumference 47 inches    Hip Circumference 42 inches    Waist to Hip Ratio 1.12 %    BMI (Calculated) 35.14    Grip Strength 44.2 kg           Nutrition Therapy Plan and Nutrition Goals:  Nutrition Therapy & Goals - 05/11/24 1313       Intervention Plan   Intervention Prescribe, educate and counsel regarding individualized specific dietary modifications aiming towards targeted core components such as weight, hypertension, lipid management, diabetes, heart failure and other comorbidities.;Nutrition handout(s) given to patient.    Expected Outcomes Short Term Goal: Understand basic principles of dietary content, such as calories, fat, sodium, cholesterol and nutrients.;Short Term Goal: A plan has been developed with personal nutrition goals set during dietitian appointment.;Long Term Goal: Adherence to prescribed nutrition plan.          Nutrition Assessments:  MEDIFICTS Score Key: >=70 Need to make dietary changes  40-70 Heart Healthy Diet <= 40 Therapeutic Level Cholesterol Diet  Flowsheet Row PULMONARY VIRTUAL BASED CARE from 05/11/2024 in [facility] CARDIAC REHABILITATION  Picture Your Plate Total Score on Admission 49   Picture Your Plate Scores: <59 Unhealthy dietary pattern with much room for improvement. 41-50 Dietary pattern unlikely to meet recommendations for good health and room for improvement. 51-60 More healthful dietary pattern, with some room for improvement.  >60 Healthy dietary pattern, although there may be some specific behaviors that could be improved.    Nutrition Goals Re-Evaluation:  Nutrition Goals Re-Evaluation     Row  Name 07/01/24 1145             Goals   Nutrition Goal Short: start to incorporate more fiber in his diet. Long: eat 3 well-balanced meals per day.       Comment Nephtali notes he is not eating the best at home, he does not eat much and not very well-balanced meals, he does like to eat fish and chicken and salads. I talked with Rito about continuing fish and chicken for protein, and maybe switching his salads with iceberg lettuce to more dark-green leafy. Jorah also complains of constipation, so I spoke with him about his fiber intake, I mentioned metamucial supplement and foods that are high in fiber.       Expected Outcome Short: start to incorporate more fiber in his diet. Long: eat 3 well-balanced meals per day.          Nutrition Goals Discharge (Final Nutrition Goals Re-Evaluation):  Nutrition Goals Re-Evaluation - 07/01/24 1145       Goals   Nutrition Goal Short: start to incorporate more fiber in his diet. Long: eat 3 well-balanced meals per day.    Comment Adoni notes he is not eating the best at home, he does not eat much and not very well-balanced meals, he does like to  eat fish and chicken and salads. I talked with Arihant about continuing fish and chicken for protein, and maybe switching his salads with iceberg lettuce to more dark-green leafy. Tresean also complains of constipation, so I spoke with him about his fiber intake, I mentioned metamucial supplement and foods that are high in fiber.    Expected Outcome Short: start to incorporate more fiber in his diet. Long: eat 3 well-balanced meals per day.          Psychosocial: Target Goals: Acknowledge presence or absence of significant depression and/or stress, maximize coping skills, provide positive support system. Participant is able to verbalize types and ability to use techniques and skills needed for reducing stress and depression.  Initial Review & Psychosocial Screening:  Initial Psych Review & Screening - 05/11/24 1313        Initial Review   Current issues with None Identified      Family Dynamics   Good Support System? Yes    Comments Jamaurion didn't specify who his support system was, but he states he has people to help him if needed.      Barriers   Psychosocial barriers to participate in program There are no identifiable barriers or psychosocial needs.      Screening Interventions   Interventions Encouraged to exercise    Expected Outcomes Short Term goal: Identification and review with participant of any Quality of Life or Depression concerns found by scoring the questionnaire.;Long Term goal: The participant improves quality of Life and PHQ9 Scores as seen by post scores and/or verbalization of changes;Long Term Goal: Stressors or current issues are controlled or eliminated.;Short Term goal: Utilizing psychosocial counselor, staff and physician to assist with identification of specific Stressors or current issues interfering with healing process. Setting desired goal for each stressor or current issue identified.          Quality of Life Scores:  Scores of 19 and below usually indicate a poorer quality of life in these areas.  A difference of  2-3 points is a clinically meaningful difference.  A difference of 2-3 points in the total score of the Quality of Life Index has been associated with significant improvement in overall quality of life, self-image, physical symptoms, and general health in studies assessing change in quality of life.   PHQ-9: Review Flowsheet  More data exists      06/18/2024 05/17/2024 02/11/2024 01/20/2024 01/13/2024  Depression screen PHQ 2/9  Decreased Interest 0 0 1 0 0 0  Down, Depressed, Hopeless 0 0 1 0 0 0  PHQ - 2 Score 0 0 2 0 0 0  Altered sleeping 0 0 1 0 0 0  Tired, decreased energy 0 1 2 0 0 0  Change in appetite 0 0 0 0 0 0  Feeling bad or failure about yourself  0 0 1 0 0 0  Trouble concentrating 0 0 0 0 0 0  Moving slowly or fidgety/restless 0 1 2 0 0 0   Suicidal thoughts 0 0 0 0 0 0  PHQ-9 Score 0 2 8 0 0 0  Difficult doing work/chores Not difficult at all Not difficult at all Somewhat difficult Not difficult at all Not difficult at all Not difficult at all    Details       Multiple values from one day are sorted in reverse-chronological order        Interpretation of Total Score  Total Score Depression Severity:  1-4 = Minimal depression, 5-9 = Mild  depression, 10-14 = Moderate depression, 15-19 = Moderately severe depression, 20-27 = Severe depression   Psychosocial Evaluation and Intervention:  Psychosocial Evaluation - 05/11/24 1314       Psychosocial Evaluation & Interventions   Interventions Encouraged to exercise with the program and follow exercise prescription    Comments Aleister is a pleasant gentleman who is coming into rehab for COPD with chronic bronchitis and emphysema. He has been in the program before but it was for cardiac reasoning.  Derwin states  that he gets around well and is independent with his ADL's. He does not currently do any home exercise besides ADL's and walking his dog outside. He is on 3L oxygen  as needed, and a CPAP at night with 3L. He is having a colonscopy tomorrow and is not very happy about that, but he is eager to start back our program starting Monday the 14th.    Expected Outcomes Short: Increase strength and stamina. Long: Get off oxygen  or be able to wean down eventually.    Continue Psychosocial Services  Follow up required by staff          Psychosocial Re-Evaluation:  Psychosocial Re-Evaluation     Row Name 07/01/24 1141             Psychosocial Re-Evaluation   Current issues with None Identified       Comments Jvon is doing great in rehab. Braeden notes that he is staying busy at home, and going out and doing things with his wife. Bora is enjoying life and coping well.       Expected Outcomes Short: continue to attend rehab. Long: continue to stay active in activities that make him  happy.       Interventions Encouraged to attend Pulmonary Rehabilitation for the exercise       Continue Psychosocial Services  Follow up required by staff          Psychosocial Discharge (Final Psychosocial Re-Evaluation):  Psychosocial Re-Evaluation - 07/01/24 1141       Psychosocial Re-Evaluation   Current issues with None Identified    Comments Stewart is doing great in rehab. Abdulkadir notes that he is staying busy at home, and going out and doing things with his wife. Atul is enjoying life and coping well.    Expected Outcomes Short: continue to attend rehab. Long: continue to stay active in activities that make him happy.    Interventions Encouraged to attend Pulmonary Rehabilitation for the exercise    Continue Psychosocial Services  Follow up required by staff           Education: Education Goals: Education classes will be provided on a weekly basis, covering required topics. Participant will state understanding/return demonstration of topics presented.  Learning Barriers/Preferences:  Learning Barriers/Preferences - 05/11/24 1313       Learning Barriers/Preferences   Learning Barriers None    Learning Preferences Video;Written Material;Pictoral          Education Topics: How Lungs Work and Diseases: - Discuss the anatomy of the lungs and diseases that can affect the lungs, such as COPD. Flowsheet Row PULMONARY REHAB OTHER RESPIRATORY from 06/08/2020 in [facility] [facility]  Date 03/12/18  Educator D. Coad  Instruction Review Code 2- Demonstrated Understanding    Exercise: -Discuss the importance of exercise, FITT principles of exercise, normal and abnormal responses to exercise, and how to exercise safely. Flowsheet Row PULMONARY REHAB OTHER RESPIRATORY from 06/08/2020 in [place] [facility]  Date 03/05/18  Educator GC  Instruction Review Code 2- Demonstrated Understanding    Environmental Irritants: -Discuss types of environmental  irritants and how to limit exposure to environmental irritants. Flowsheet Row PULMONARY REHAB OTHER RESPIRATORY from 06/08/2020 in [place] [facility]  Date 05/18/20  Educator CHARM Louder  Instruction Review Code 1- Verbalizes Understanding    Meds/Inhalers and oxygen : - Discuss respiratory medications, definition of an inhaler and oxygen , and the proper way to use an inhaler and oxygen . Flowsheet Row PULMONARY REHAB OTHER RESPIRATORY from 06/08/2020 in [place] [facility]  Date 03/26/18  Educator DC    Energy Saving Techniques: - Discuss methods to conserve energy and decrease shortness of breath when performing activities of daily living.  Flowsheet Row PULMONARY REHAB OTHER RESPIRATORY from 06/08/2020 in [place] [facility]  Date 04/02/18  Educator GC  Instruction Review Code 2- Demonstrated Understanding    Bronchial Hygiene / Breathing Techniques: - Discuss breathing mechanics, pursed-lip breathing technique,  proper posture, effective ways to clear airways, and other functional breathing techniques Flowsheet Row PULMONARY REHAB OTHER RESPIRATORY from 06/08/2020 in [place] [facility]  Date 04/09/18  Educator DC  Instruction Review Code 2- Demonstrated Understanding    Cleaning Equipment: - Provides group verbal and written instruction about the health risks of elevated stress, cause of high stress, and healthy ways to reduce stress. Flowsheet Row PULMONARY REHAB OTHER RESPIRATORY from 06/08/2020 in [place] [facility]  Date 01/15/18  Educator GC  Instruction Review Code 2- Demonstrated Understanding    Nutrition I: Fats: - Discuss the types of cholesterol, what cholesterol does to the body, and how cholesterol levels can be controlled. Flowsheet Row PULMONARY REHAB OTHER RESPIRATORY from 06/08/2020 in [place] [facility]  Date 01/22/18  Educator GC  Instruction Review Code 2- Demonstrated  Understanding    Nutrition II: Labels: -Discuss the different components of food labels and how to read food labels. Flowsheet Row PULMONARY REHAB OTHER RESPIRATORY from 06/08/2020 in [place] [facility]  Date 01/29/18  Educator DJ  Instruction Review Code 2- Demonstrated Understanding    Respiratory Infections: - Discuss the signs and symptoms of respiratory infections, ways to prevent respiratory infections, and the importance of seeking medical treatment when having a respiratory infection. Flowsheet Row PULMONARY REHAB OTHER RESPIRATORY from 06/08/2020 in [place] [facility]  Date 02/05/18  Educator GC  Instruction Review Code 2- Demonstrated Understanding    Stress I: Signs and Symptoms: - Discuss the causes of stress, how stress may lead to anxiety and depression, and ways to limit stress.   Stress II: Relaxation: -Discuss relaxation techniques to limit stress. Flowsheet Row PULMONARY REHAB OTHER RESPIRATORY from 06/08/2020 in [place] [facility]  Date 04/20/20  Educator DF  Instruction Review Code 2- Demonstrated Understanding    Oxygen  for Home/Travel: - Discuss how to prepare for travel when on oxygen  and proper ways to transport and store oxygen  to ensure safety.   Knowledge Questionnaire Score:  Knowledge Questionnaire Score - 05/11/24 1324       Knowledge Questionnaire Score   Pre Score 15/18          Core Components/Risk Factors/Patient Goals at Admission:  Personal Goals and Risk Factors at Admission - 05/11/24 1312       Core Components/Risk Factors/Patient Goals on Admission   Improve shortness of breath with ADL's Yes    Intervention Provide education, individualized exercise plan and daily activity instruction to help decrease symptoms of SOB with activities of daily  living.    Expected Outcomes Short Term: Improve cardiorespiratory fitness to achieve a reduction of symptoms when performing ADLs;Long Term: Be  able to perform more ADLs without symptoms or delay the onset of symptoms    Increase knowledge of respiratory medications and ability to use respiratory devices properly  Yes    Intervention Provide education and demonstration as needed of appropriate use of medications, inhalers, and oxygen  therapy.    Expected Outcomes Long Term: Maintain appropriate use of medications, inhalers, and oxygen  therapy.;Short Term: Achieves understanding of medications use. Understands that oxygen  is a medication prescribed by physician. Demonstrates appropriate use of inhaler and oxygen  therapy.    Heart Failure Yes    Intervention Provide a combined exercise and nutrition program that is supplemented with education, support and counseling about heart failure. Directed toward relieving symptoms such as shortness of breath, decreased exercise tolerance, and extremity edema.    Expected Outcomes Improve functional capacity of life;Short term: Attendance in program 2-3 days a week with increased exercise capacity. Reported lower sodium intake. Reported increased fruit and vegetable intake. Reports medication compliance.;Short term: Daily weights obtained and reported for increase. Utilizing diuretic protocols set by physician.;Long term: Adoption of self-care skills and reduction of barriers for early signs and symptoms recognition and intervention leading to self-care maintenance.    Hypertension Yes    Intervention Provide education on lifestyle modifcations including regular physical activity/exercise, weight management, moderate sodium restriction and increased consumption of fresh fruit, vegetables, and low fat dairy, alcohol moderation, and smoking cessation.;Monitor prescription use compliance.    Expected Outcomes Short Term: Continued assessment and intervention until BP is < 140/38mm HG in hypertensive participants. < 130/53mm HG in hypertensive participants with diabetes, heart failure or chronic kidney  disease.;Long Term: Maintenance of blood pressure at goal levels.    Lipids Yes    Intervention Provide education and support for participant on nutrition & aerobic/resistive exercise along with prescribed medications to achieve LDL 70mg , HDL >40mg .    Expected Outcomes Long Term: Cholesterol controlled with medications as prescribed, with individualized exercise RX and with personalized nutrition plan. Value goals: LDL < 70mg , HDL > 40 mg.;Short Term: Participant states understanding of desired cholesterol values and is compliant with medications prescribed. Participant is following exercise prescription and nutrition guidelines.          Core Components/Risk Factors/Patient Goals Review:   Goals and Risk Factor Review     Row Name 07/01/24 1151             Core Components/Risk Factors/Patient Goals Review   Personal Goals Review Improve shortness of breath with ADL's;Develop more efficient breathing techniques such as purse lipped breathing and diaphragmatic breathing and practicing self-pacing with activity.;Increase knowledge of respiratory medications and ability to use respiratory devices properly.;Weight Management/Obesity       Review Bertha is doing great in rehab. Claris has COPD and is on 3L/min via nasal cannula, continous, and wears a CPAP at bedtime; which are all working very well for him. Kaylan has noted some issues with constipation and more gas. We spoke about him following up with his GI doctor.       Expected Outcomes Short: follow up with his GI doctor. Long: continue to wear his oxygen  devices as prescribed.          Core Components/Risk Factors/Patient Goals at Discharge (Final Review):   Goals and Risk Factor Review - 07/01/24 1151       Core Components/Risk Factors/Patient Goals Review  Personal Goals Review Improve shortness of breath with ADL's;Develop more efficient breathing techniques such as purse lipped breathing and diaphragmatic breathing and practicing  self-pacing with activity.;Increase knowledge of respiratory medications and ability to use respiratory devices properly.;Weight Management/Obesity    Review Rodert is doing great in rehab. Norville has COPD and is on 3L/min via nasal cannula, continous, and wears a CPAP at bedtime; which are all working very well for him. Delano has noted some issues with constipation and more gas. We spoke about him following up with his GI doctor.    Expected Outcomes Short: follow up with his GI doctor. Long: continue to wear his oxygen  devices as prescribed.          ITP Comments:  ITP Comments     Row Name 05/11/24 1308 05/17/24 1029 05/19/24 0925 06/08/24 1037 06/16/24 1150   ITP Comments Completed virtual orientation today.  EP evaluation is scheduled for 05/17/24 at 1000 .  Documentation for diagnosis can be found in [facility] encounter 04/28/24. Patient attend orientation today.  Patient is attending Pulmonary Rehabilitation Program.  Documentation for diagnosis can be found in [facility].  Reviewed medical chart, RPE/RPD, gym safety, and program guidelines.  Patient was fitted to equipment they will be using during rehab.  Patient is scheduled to start exercise on 06/08/24.   Initial ITP created and sent for review and signature by Dr. Anton Kelp, Medical Director for Pulmonary Rehabilitation Program. 30 day review completed. ITP sent to Dr.Jehanzeb Memon, Medical Director of  Pulmonary Rehab. Continue with ITP unless changes are made by physician.  New to program, only attended orientation thus far. First full day of exercise!  Patient was oriented to gym and equipment including functions, settings, policies, and procedures.  Patient's individual exercise prescription and treatment plan were reviewed.  All starting workloads were established based on the results of the 6 minute walk test done at initial orientation visit.  The plan for exercise progression was also introduced and progression will be customized based on patient's  performance and goals. 30 day review completed. ITP sent to Dr.Jehanzeb Memon, Medical Director of  Pulmonary Rehab. Continue with ITP unless changes are made by physician.   Still new to program with start delayed to 06/08/24 and has only completed two exercise sessions thus far.    Row Name 07/14/24 1507           ITP Comments 30 day review completed. ITP sent to Dr.Jehanzeb Memon, Medical Director of  Pulmonary Rehab. Continue with ITP unless changes are made by physician.          Comments: 30 day review

## 2024-07-15 ENCOUNTER — Encounter (HOSPITAL_COMMUNITY)

## 2024-07-16 ENCOUNTER — Telehealth (INDEPENDENT_AMBULATORY_CARE_PROVIDER_SITE_OTHER): Payer: Self-pay

## 2024-07-19 ENCOUNTER — Other Ambulatory Visit: Payer: Self-pay | Admitting: Pulmonary Disease

## 2024-07-19 ENCOUNTER — Ambulatory Visit: Payer: Self-pay | Admitting: *Deleted

## 2024-07-19 NOTE — Telephone Encounter (Signed)
 FYI Only or Action Required?: Action required by provider: request for appointment and update on patient condition.  Patient was last seen in primary care on 07/02/2024 by Bevely Doffing, FNP.  Called Nurse Triage reporting Eye Drainage.  Symptoms began several weeks ago.  Interventions attempted: OTC medications: vaseline .  Symptoms are: gradually worsening.  Triage Disposition: See Physician Within 24 Hours  Patient/caregiver understands and will follow disposition?: No, wishes to speak with PCP           Copied from CRM #[id number]. Topic: Clinical - Red Word Triage >> Jul 19, 2024 12:58 PM Donee H wrote: Red Word that prompted transfer to Nurse Triage: Patient experiencing itching in eyes. He states mucus of some sort coming out of eyes and he's eyes are constantly watering. He was recently seen about 2 weeks ago and given antibiotics. He states it's really not working and want to know what to do. Reason for Disposition  Eyelid is red and painful (or tender to touch)  Answer Assessment - Initial Assessment Questions Patient last OV 07/02/24 with URI sx. Patient continues to report nasal stuffiness and nose runny clear fluid. Eyes itching watery  top eyelids red and dry skin . Noted left ear skin dry itching. Please advise if medication can be prescribed or other recommendations. Please advise. If additional appt needed. None available until Oct 1.        1. EYE DISCHARGE: Is the discharge in one or both eyes? What color is it? How much is there? When did the discharge start?      Both eyes clear drainage  on going since 2 weeks ago  2. REDNESS OF SCLERA: Is there redness in the white of the eye? If Yes, ask: Is it in one or both eyes? When did the redness start?     No but itching  3. EYELIDS: Are the eyelids red or swollen? If Yes, ask: How much?      Top of eyelids red bottom lid red 4. VISION: Do you have blurred vision?     na 5. PAIN: Is there  any pain? If Yes, ask: How bad is the pain? (Scale 0-10; or none, mild, moderate, severe)     No pain itching and watery  6. CONTACT LENS: Do you wear contacts?    Yes but has not worn since URI  7. OTHER SYMPTOMS: Do you have any other symptoms? (e.g., fever, runny nose, cough)     Stuffy nose nose constant running. Finished antibiotics skin to eyes and outer ears dry itching. 8. PREGNANCY: Is there any chance you are pregnant? When was your last menstrual period?     na  Protocols used: Eye - Pus or Discharge-A-AH

## 2024-07-20 ENCOUNTER — Telehealth: Payer: Self-pay

## 2024-07-20 ENCOUNTER — Encounter (HOSPITAL_COMMUNITY)
Admission: RE | Admit: 2024-07-20 | Discharge: 2024-07-20 | Disposition: A | Discharge status: Home / Self Care | Source: Ambulatory Visit | Attending: Pulmonary Disease

## 2024-07-20 DIAGNOSIS — J4489 Other specified chronic obstructive pulmonary disease: Secondary | ICD-10-CM | POA: Diagnosis not present

## 2024-07-20 DIAGNOSIS — J439 Emphysema, unspecified: Secondary | ICD-10-CM | POA: Diagnosis not present

## 2024-07-20 NOTE — Telephone Encounter (Signed)
 Copied from CRM [id number]. Topic: Clinical - Medication Question >> Jul 20, 2024 12:17 PM Debby BROCKS wrote: Reason for CRM: Patient would like to receive a call back from Virgil, Orvil, CMA to see if the schedule is ready for the allergy from red word triage on 07/19/2024 as well as a medication for it

## 2024-07-20 NOTE — Progress Notes (Signed)
 Daily Session Note  Patient Details  Name: Kiwan Gadsden MRN: [id number] Date of Birth: 12/21/1960 Referring Provider:   Flowsheet Row PULMONARY REHAB OTHER RESP ORIENTATION from 05/17/2024 in [facility]  Referring Provider Theophilus Roosevelt MD    Encounter Date: 07/20/2024  Check In:  Session Check In - 07/20/24 1018       Check-In   Supervising physician immediately available to respond to emergencies See telemetry face sheet for immediately available MD    Location [facility]    Staff Present Laymon Rattler, BSN, RN, WTA-C;Heather Con, BS, Exercise Physiologist;Mary Idell Glen, RN, BSN, MA    Virtual Visit No    Medication changes reported     No    Fall or balance concerns reported    No    Tobacco Cessation No Change    Warm-up and Cool-down Performed on first and last piece of equipment    Resistance Training Performed Yes    VAD Patient? No    PAD/SET Patient? No      Pain Assessment   Currently in Pain? No/denies          Capillary Blood Glucose: No results found for this or any previous visit (from the past 24 hours).    Social History   Tobacco Use  Smoking Status Former   Current packs/day: 0.00   Average packs/day: 1 pack/day for 38.0 years (38.0 ttl pk-yrs)   Types: Cigarettes   Start date: 02/20/1982   Quit date: 02/21/2020   Years since quitting: 4.4  Smokeless Tobacco Never    Goals Met:  Proper associated with RPD/PD & O2 Sat Independence with exercise equipment Improved SOB with ADL's Using PLB without cueing & demonstrates good technique Exercise tolerated well No report of concerns or symptoms today Strength training completed today  Goals Unmet:  Not Applicable  Comments: Pt able to follow exercise prescription today without complaint.  Will continue to monitor for progression.

## 2024-07-20 NOTE — Telephone Encounter (Signed)
 Lmtrc

## 2024-07-21 NOTE — Telephone Encounter (Signed)
 Attempted to contact pt, per Tobie: Please provide him an office visit on 07/23/24 at 11 AM or earlier. , unable to reach pt.

## 2024-07-21 NOTE — Telephone Encounter (Addendum)
 Pt sched 9:40 9/19

## 2024-07-22 ENCOUNTER — Encounter (HOSPITAL_COMMUNITY)

## 2024-07-23 ENCOUNTER — Encounter: Payer: Self-pay | Admitting: Internal Medicine

## 2024-07-23 ENCOUNTER — Ambulatory Visit (INDEPENDENT_AMBULATORY_CARE_PROVIDER_SITE_OTHER): Admitting: Internal Medicine

## 2024-07-23 VITALS — BP 116/73 | HR 85 | Ht 69.0 in | Wt 239.4 lb

## 2024-07-23 DIAGNOSIS — H60502 Unspecified acute noninfective otitis externa, left ear: Secondary | ICD-10-CM | POA: Diagnosis not present

## 2024-07-23 DIAGNOSIS — H1013 Acute atopic conjunctivitis, bilateral: Secondary | ICD-10-CM | POA: Diagnosis not present

## 2024-07-23 MED ORDER — CIPROFLOXACIN-DEXAMETHASONE 0.3-0.1 % OT SUSP: 4.0000 [drp] | Freq: Two times a day (BID) | OTIC | 0 refills | Status: DC

## 2024-07-23 MED ORDER — OLOPATADINE HCL 0.2 % OP SOLN: 1.0000 [drp] | Freq: Every day | OPHTHALMIC | 0 refills | Status: DC

## 2024-07-23 NOTE — Patient Instructions (Addendum)
 Please apply Pataday  eye drop for allergic reaction or scratchy eyes.  Please apply Ciprodex  ear drop to left ear.

## 2024-07-23 NOTE — Assessment & Plan Note (Addendum)
 His eye symptoms are likely due to allergic conjunctivitis Advised to wear protective equipment while working in the yard Pataday  eyedrop prescribed Continue Xyzal 

## 2024-07-23 NOTE — Assessment & Plan Note (Signed)
 Prescribed Ciprodex  eardrops considering history of recurrent otitis media Advised to apply lotion or moisturizer for dry skin

## 2024-07-23 NOTE — Progress Notes (Signed)
 Acute Office Visit  Subjective:    Patient ID: Ricardo Burch, male    DOB: 01-15-1961, 63 y.o.   MRN: [id number]  Chief Complaint  Patient presents with   Eye Drainage    Patient experiencing itching in eyes. He states mucus of some sort coming out of eyes and he's eyes are constantly watering. He was recently seen about 3 weeks ago and given antibiotics    HPI Patient is in today for complaint of bilateral eye itching and mucoid discharge for the last 3 weeks, although reports mild improvement recently.  He was seen by NP about 3 weeks ago for upper respiratory tract symptoms in addition to eye redness and discharge, was given oral azithromycin  and prednisone  at that time.  Of note, he reports working in the yard prior to the onset of his symptoms.  He also reports left ear dryness and itching.  He has a scab over left ear helix area after scratching.  He has history of recurrent otitis media in the past.  Past Medical History:  Diagnosis Date   Allergy    CHF (congestive heart failure) (HCC)    HFpEF   Chronic kidney disease    COPD (chronic obstructive pulmonary disease) (HCC) Dx 2015   Depression    Eczema    since childhood    Emphysema of lung (HCC)    Erectile dysfunction 08/18/2019   GERD (gastroesophageal reflux disease) 01/25/2020   Hyperlipidemia 01/12/2018   Hypertension    Oxygen  deficiency    Screening for HIV (human immunodeficiency virus)    Sleep apnea    CPAP   Substance abuse (HCC)    MJ, cocaine   Testicular failure 07/22/2019    Past Surgical History:  Procedure Laterality Date   COLONOSCOPY N/A 05/12/2024   Procedure: COLONOSCOPY;  Surgeon: Shaaron Lamar HERO, MD;  Location: [facility];  Service: Endoscopy;  Laterality: N/A;  9:45 am, asa 3   COLONOSCOPY WITH PROPOFOL  N/A 06/25/2017   Surgeon: Shaaron Lamar HERO, MD;  diverticulosis, two 4 to 6 mm polyps resected and retrieved.  Pathology with tubular adenoma x 1 and benign colonic mucosa.  Recommended  5-year surveillance.   MULTIPLE EXTRACTIONS WITH ALVEOLOPLASTY N/A 03/22/2016   Procedure: MULTIPLE EXTRACTION WITH ALVEOLOPLASTY;  Surgeon: Glendia Primrose, DDS;  Location: [facility];  Service: Oral Surgery;  Laterality: N/A;   NO PAST SURGERIES     POLYPECTOMY  06/25/2017   Procedure: POLYPECTOMY;  Surgeon: Shaaron Lamar HERO, MD;  Location: [facility];  Service: Endoscopy;;  colon    Family History  Problem Relation Age of Onset   Arthritis Mother    Hypertension Mother    Stroke Mother        age 66   Cerebral aneurysm Father    Alcohol abuse Father    Cancer Brother    Diabetes Neg Hx    Heart disease Neg Hx    Colon cancer Neg Hx     Social History   Socioeconomic History   Marital status: Single    Spouse name: Not on file   Number of children: 5   Years of education: 12   Highest education level: 12th grade  Occupational History   Occupation: Unemployed     Comment: car wash details  Tobacco Use   Smoking status: Former    Current packs/day: 0.00    Average packs/day: 1 pack/day for 38.0 years (38.0 ttl pk-yrs)    Types: Cigarettes    Start  date: 02/20/1982    Quit date: 02/21/2020    Years since quitting: 4.4   Smokeless tobacco: Never  Vaping Use   Vaping status: Never Used  Substance and Sexual Activity   Alcohol use: Yes    Alcohol/week: 1.0 standard drink of alcohol    Types: 1 Cans of beer per week    Comment: Socially x 1/month currently (as of 01/25/20); previously: occassionally: weekends, 6-pack or less on a day; or half pint of liquior   Drug use: Not Currently    Types: Cocaine, Marijuana    Comment: 01/25/20-marijuana few weeks ago, no cocaine (4-6 yeats ago)   Sexual activity: Never  Other Topics Concern   Not on file  Social History Narrative    Lives alone.    5 daughters 24, 46 yo twins, eldest 2 are married live in [place]. Retired.Girlfriend works in Programmer, applications.   Social Drivers of Health   Financial Resource Strain: Medium Risk  (06/14/2024)   Overall Financial Resource Strain (CARDIA)    Difficulty of Paying Living Expenses: Somewhat hard  Food Insecurity: Food Insecurity Present (06/14/2024)   Hunger Vital Sign    Worried About Running Out of Food in the Last Year: Sometimes true    Ran Out of Food in the Last Year: Sometimes true  Transportation Needs: No Transportation Needs (06/14/2024)   PRAPARE - Administrator, Civil Service (Medical): No    Lack of Transportation (Non-Medical): No  Physical Activity: Sufficiently Active (06/14/2024)   Exercise Vital Sign    Days of Exercise per Week: 5 days    Minutes of Exercise per Session: 100 min  Stress: No Stress Concern Present (06/14/2024)   [employer] of Occupational Health - Occupational Stress Questionnaire    Feeling of Stress: Not at all  Social Connections: Unknown (06/14/2024)   Social Connection and Isolation Panel    Frequency of Communication with Friends and Family: More than three times a week    Frequency of Social Gatherings with Friends and Family: Twice a week    Attends Religious Services: More than 4 times per year    Active Member of [employer] or Organizations: No    Attends Banker Meetings: Not on file    Marital Status: Not on file  Intimate Partner Violence: Not At Risk (01/06/2024)   Humiliation, Afraid, Rape, and Kick questionnaire    Fear of Current or Ex-Partner: No    Emotionally Abused: No    Physically Abused: No    Sexually Abused: No    Outpatient Medications Prior to Visit  Medication Sig Dispense Refill   albuterol  (VENTOLIN  HFA) 108 (90 Base) MCG/ACT inhaler INHALE 1-2 PUFFS BY MOUTH EVERY 6 HOURS AS NEEDED FOR WHEEZING AND SHORTNESS OF BREATH 8.5 g 11   amLODipine  (NORVASC ) 5 MG tablet TAKE 1 TABLET BY MOUTH DAILY 30 tablet 11   fluticasone  (FLONASE ) 50 MCG/ACT nasal spray INSTILL TWO (2) SPRAYS IN EACH NOSTRIL TWICE DAILY 16 g 11   Fluticasone -Umeclidin-Vilant (TRELEGY ELLIPTA ) 200-62.5-25 MCG/ACT  AEPB INHALE 1 PUFF BY MOUTH DAILY 60 each 9   furosemide  (LASIX ) 20 MG tablet Take 20 mg by mouth daily.     levocetirizine (XYZAL ) 5 MG tablet Take 1 tablet (5 mg total) by mouth every evening. 30 tablet 5   OXYGEN  Inhale 3 L into the lungs daily. continuous     OZEMPIC, 1 MG/DOSE, 4 MG/3ML SOPN SMARTSIG:0.75 Milliliter(s) SUB-Q Once a Week     roflumilast  (  DALIRESP ) 500 MCG TABS tablet TAKE 1 TABLET ( ) BY MOUTH DAILY 30 tablet 11   sildenafil  (VIAGRA ) 50 MG tablet Take 1 tablet (50 mg total) by mouth daily as needed for erectile dysfunction. 30 tablet 1   spironolactone (ALDACTONE) 25 MG tablet Take 25 mg by mouth daily.     methylPREDNISolone  (MEDROL  DOSEPAK) 4 MG TBPK tablet Take as package instructions. 1 each 0   No facility-administered medications prior to visit.    Allergies  Allergen Reactions   Apresoline  [Hydralazine ] Other (See Comments)    Headache     Review of Systems  Constitutional:  Positive for fatigue. Negative for chills and fever.  HENT:  Positive for congestion. Negative for sore throat.   Eyes:  Positive for discharge and itching. Negative for pain.  Respiratory:  Positive for cough (Chronic) and shortness of breath (Intermittent).   Cardiovascular:  Negative for chest pain and palpitations.  Gastrointestinal:  Negative for diarrhea, nausea and vomiting.  Endocrine: Negative for polydipsia and polyuria.  Genitourinary:  Negative for dysuria and hematuria.  Musculoskeletal:  Positive for back pain. Negative for neck pain and neck stiffness.  Skin:  Negative for rash.  Neurological:  Negative for dizziness, weakness, numbness and headaches.  Psychiatric/Behavioral:  Negative for agitation and behavioral problems.        Objective:    Physical Exam Vitals reviewed.  Constitutional:      General: He is not in acute distress.    Appearance: He is not diaphoretic.  HENT:     Head: Normocephalic and atraumatic.     Right Ear: No middle ear effusion.      Left Ear:  No middle ear effusion.     Ears:     Comments: Dry skin and scabbing noted over left ear    Nose: Congestion present.     Mouth/Throat:     Mouth: Mucous membranes are moist.  Eyes:     General: No scleral icterus.    Extraocular Movements: Extraocular movements intact.     Comments: Mild erythematous conjunctiva, clear discharge noted  Cardiovascular:     Rate and Rhythm: Normal rate and regular rhythm.     Heart sounds: Normal heart sounds. No murmur heard. Pulmonary:     Breath sounds: No wheezing or rales.  Musculoskeletal:     Cervical back: Neck supple. No tenderness.     Lumbar back: Tenderness (right paraspinal) present. Decreased range of motion.     Right lower leg: No edema.     Left lower leg: No edema.  Skin:    General: Skin is warm.     Findings: No rash.  Neurological:     General: No focal deficit present.     Mental Status: He is alert and oriented to person, place, and time.     Sensory: No sensory deficit.     Motor: No weakness.  Psychiatric:        Mood and Affect: Mood normal.        Behavior: Behavior normal.     BP 116/73   Pulse 85   Ht 5' 9 (1.753 m)   Wt 239 lb 6.4 oz (108.6 kg)   SpO2 90% Comment: has oxygen  with him  BMI 35.35 kg/m  Wt Readings from Last 3 Encounters:  07/23/24 239 lb 6.4 oz (108.6 kg)  07/02/24 236 lb 1.3 oz (107.1 kg)  06/18/24 236 lb 6.4 oz (107.2 kg)        Assessment & Plan:  Problem List Items Addressed This Visit       Nervous and Auditory   Acute otitis externa of left ear   Prescribed Ciprodex  eardrops considering history of recurrent otitis media Advised to apply lotion or moisturizer for dry skin      Relevant Medications   ciprofloxacin -dexamethasone  (CIPRODEX ) OTIC suspension     Other   Allergic conjunctivitis of both eyes - Primary   His eye symptoms are likely due to allergic conjunctivitis Advised to wear protective equipment while working in the yard Pataday  eyedrop  prescribed Continue Xyzal       Relevant Medications   Olopatadine  HCl 0.2 % SOLN     Meds ordered this encounter  Medications   Olopatadine  HCl 0.2 % SOLN    Sig: Apply 1 drop to eye daily.    Dispense:  2.5 mL    Refill:  0   ciprofloxacin -dexamethasone  (CIPRODEX ) OTIC suspension    Sig: Place 4 drops into the left ear 2 (two) times daily.    Dispense:  7.5 mL    Refill:  0     Marv Alfrey MARLA Blanch, MD

## 2024-07-27 ENCOUNTER — Encounter (HOSPITAL_COMMUNITY)
Admission: RE | Admit: 2024-07-27 | Discharge: 2024-07-27 | Disposition: A | Discharge status: Home / Self Care | Source: Ambulatory Visit | Attending: Pulmonary Disease

## 2024-07-27 DIAGNOSIS — J4489 Other specified chronic obstructive pulmonary disease: Secondary | ICD-10-CM | POA: Diagnosis not present

## 2024-07-27 DIAGNOSIS — J439 Emphysema, unspecified: Secondary | ICD-10-CM | POA: Diagnosis not present

## 2024-07-27 NOTE — Progress Notes (Signed)
 Daily Session Note  Patient Details  Name: Ricardo Burch MRN: [id number] Date of Birth: 07-29-1961 Referring Provider:   Flowsheet Row PULMONARY REHAB OTHER RESP ORIENTATION from 05/17/2024 in [facility] CARDIAC REH[facility]ILITATION  Referring Provider Theophilus Roosevelt MD    Encounter Date: 07/27/2024  Check In:  Session Check In - 07/27/24 1029       Check-In   Supervising physician immediately available to respond to emergencies See telemetry face sheet for immediately available MD    Location [facility]    Staff Present Danney Daring, BS, RRT, CPFT;Miroslava Santellan Jackquline, BSN, RN, WTA-C;Heather Con, BS, Exercise Physiologist    Virtual Visit No    Medication changes reported     No    Fall or balance concerns reported    No    Tobacco Cessation No Change    Warm-up and Cool-down Performed on first and last piece of equipment    Resistance Training Performed Yes    VAD Patient? No    PAD/SET Patient? No      Pain Assessment   Currently in Pain? No/denies          Capillary Blood Glucose: No results found for this or any previous visit (from the past 24 hours).    Social History   Tobacco Use  Smoking Status Former   Current packs/day: 0.00   Average packs/day: 1 pack/day for 38.0 years (38.0 ttl pk-yrs)   Types: Cigarettes   Start date: 02/20/1982   Quit date: 02/21/2020   Years since quitting: 4.4  Smokeless Tobacco Never    Goals Met:  Proper associated with RPD/PD & O2 Sat Independence with exercise equipment Improved SOB with ADL's Using PLB without cueing & demonstrates good technique Exercise tolerated well No report of concerns or symptoms today Strength training completed today  Goals Unmet:  Not Applicable  Comments: Pt able to follow exercise prescription today without complaint.  Will continue to monitor for progression.

## 2024-07-29 ENCOUNTER — Encounter (HOSPITAL_COMMUNITY)
Admission: RE | Admit: 2024-07-29 | Discharge: 2024-07-29 | Disposition: A | Discharge status: Home / Self Care | Source: Ambulatory Visit | Attending: Pulmonary Disease

## 2024-07-29 DIAGNOSIS — J439 Emphysema, unspecified: Secondary | ICD-10-CM

## 2024-07-29 DIAGNOSIS — J4489 Other specified chronic obstructive pulmonary disease: Secondary | ICD-10-CM | POA: Diagnosis not present

## 2024-07-29 NOTE — Progress Notes (Signed)
 Daily Session Note  Patient Details  Name: Ricardo Burch MRN: [id number] Date of Birth: 1961/03/17 Referring Provider:   Flowsheet Row PULMONARY REHAB OTHER RESP ORIENTATION from 05/17/2024 in [facility] CARD[facility]AC RE[facility]  Referring Provider Theophilus Roosevelt MD    Encounter Date: 07/29/2024  Check In:  Session Check In - 07/29/24 1056       Check-In   Supervising physician immediately available to respond to emergencies See telemetry face sheet for immediately available MD    Location [facility]    Staff Present Powell Benders, BS, Exercise Physiologist;Victoria Zina, RN;Olean Sangster Vonzell, MA, RCEP, CCRP, CCET    Virtual Visit No    Medication changes reported     No    Warm-up and Cool-down Performed on first and last piece of equipment    Resistance Training Performed Yes    VAD Patient? No    PAD/SET Patient? No      Pain Assessment   Currently in Pain? No/denies          Capillary Blood Glucose: No results found for this or any previous visit (from the past 24 hours).    Social History   Tobacco Use  Smoking Status Former   Current packs/day: 0.00   Average packs/day: 1 pack/day for 38.0 years (38.0 ttl pk-yrs)   Types: Cigarettes   Start date: 02/20/1982   Quit date: 02/21/2020   Years since quitting: 4.4  Smokeless Tobacco Never    Goals Met:  Proper associated with RPD/PD & O2 Sat Independence with exercise equipment Using PLB without cueing & demonstrates good technique Exercise tolerated well No report of concerns or symptoms today Strength training completed today  Goals Unmet:  Not Applicable  Comments: Pt able to follow exercise prescription today without complaint.  Will continue to monitor for progression.

## 2024-08-03 ENCOUNTER — Encounter (HOSPITAL_COMMUNITY)

## 2024-08-05 ENCOUNTER — Encounter (HOSPITAL_COMMUNITY)

## 2024-08-10 ENCOUNTER — Encounter (HOSPITAL_COMMUNITY)
Admission: RE | Admit: 2024-08-10 | Discharge: 2024-08-10 | Disposition: A | Discharge status: Home / Self Care | Source: Ambulatory Visit | Attending: Pulmonary Disease | Admitting: Pulmonary Disease

## 2024-08-10 DIAGNOSIS — J4489 Other specified chronic obstructive pulmonary disease: Secondary | ICD-10-CM | POA: Insufficient documentation

## 2024-08-10 DIAGNOSIS — J439 Emphysema, unspecified: Secondary | ICD-10-CM | POA: Diagnosis present

## 2024-08-10 NOTE — Progress Notes (Signed)
 Daily Session Note  Patient Details  Name: Kiron Osmun MRN: [id number] Date of Birth: April 30, 1961 Referring Provider:   Flowsheet Row PULMONARY REHAB OTHER RESP ORIENTATION from 05/17/2024 in [facility] CARDIAC REHABILITATION  Referring Provider Theophilus Roosevelt MD    Encounter Date: 08/10/2024  Check In:  Session Check In - 08/10/24 1012       Check-In   Supervising physician immediately available to respond to emergencies See telemetry face sheet for immediately available MD    Location [facility]    Staff Present Laymon Rattler, BSN, RN, Estrella Daring, BS, RRT, CPFT;Heather Con HECKLE, Exercise Physiologist    Virtual Visit No    Medication changes reported     No    Fall or balance concerns reported    No    Tobacco Cessation No Change    Warm-up and Cool-down Performed on first and last piece of equipment    Resistance Training Performed Yes    VAD Patient? No    PAD/SET Patient? No      Pain Assessment   Currently in Pain? No/denies          Capillary Blood Glucose: No results found for this or any previous visit (from the past 24 hours).    Social History   Tobacco Use  Smoking Status Former   Current packs/day: 0.00   Average packs/day: 1 pack/day for 38.0 years (38.0 ttl pk-yrs)   Types: Cigarettes   Start date: 02/20/1982   Quit date: 02/21/2020   Years since quitting: 4.4  Smokeless Tobacco Never    Goals Met:  Proper associated with RPD/PD & O2 Sat Independence with exercise equipment Improved SOB with ADL's Using PLB without cueing & demonstrates good technique Exercise tolerated well No report of concerns or symptoms today Strength training completed today  Goals Unmet:  Not Applicable  Comments: Pt able to follow exercise prescription today without complaint.  Will continue to monitor for progression.

## 2024-08-11 ENCOUNTER — Encounter (HOSPITAL_COMMUNITY): Payer: Self-pay | Admitting: *Deleted

## 2024-08-11 DIAGNOSIS — J439 Emphysema, unspecified: Secondary | ICD-10-CM

## 2024-08-11 NOTE — Progress Notes (Signed)
 Pulmonary Individual Treatment Plan  Patient Details  Name: Ricardo Burch MRN: [id number] Date of Birth: 10/12/1961 Referring Provider:   Flowsheet Row PULMONARY REHAB OTHER RESP ORIENTATION from 05/17/2024 in [facility] CARDIAC REHABILITATION  Referring Provider Theophilus Roosevelt MD    Initial Encounter Date:  Flowsheet Row PULMONARY REHAB OTHER RESP ORIENTATION from 05/17/2024 in [facility] CARDIAC REHABILITATION  Date 05/17/24    Visit Diagnosis: COPD with chronic bronchitis and emphysema (HCC)  Patient's Home Medications on Admission:   Current Outpatient Medications:    albuterol  (VENTOLIN  HFA) 108 (90 Base) MCG/ACT inhaler, INHALE 1-2 PUFFS BY MOUTH EVERY 6 HOURS AS NEEDED FOR WHEEZING AND SHORTNESS OF BREATH, Disp: 8.5 g, Rfl: 11   amLODipine  (NORVASC ) 5 MG tablet, TAKE 1 TABLET BY MOUTH DAILY, Disp: 30 tablet, Rfl: 11   ciprofloxacin -dexamethasone  (CIPRODEX ) OTIC suspension, Place 4 drops into the left ear 2 (two) times daily., Disp: 7.5 mL, Rfl: 0   fluticasone  (FLONASE ) 50 MCG/ACT nasal spray, INSTILL TWO (2) SPRAYS IN EACH NOSTRIL TWICE DAILY, Disp: 16 g, Rfl: 11   Fluticasone -Umeclidin-Vilant (TRELEGY ELLIPTA ) 200-62.5-25 MCG/ACT AEPB, INHALE 1 PUFF BY MOUTH DAILY, Disp: 60 each, Rfl: 9   furosemide  (LASIX ) 20 MG tablet, Take 20 mg by mouth daily., Disp: , Rfl:    levocetirizine (XYZAL ) 5 MG tablet, Take 1 tablet (5 mg total) by mouth every evening., Disp: 30 tablet, Rfl: 5   Olopatadine  HCl 0.2 % SOLN, Apply 1 drop to eye daily., Disp: 2.5 mL, Rfl: 0   OXYGEN , Inhale 3 L into the lungs daily. continuous, Disp: , Rfl:    OZEMPIC, 1 MG/DOSE, 4 MG/3ML SOPN, SMARTSIG:0.75 Milliliter(s) SUB-Q Once a Week, Disp: , Rfl:    roflumilast  (DALIRESP ) 500 MCG TABS tablet, TAKE 1 TABLET ( ) BY MOUTH DAILY, Disp: 30 tablet, Rfl: 11   sildenafil  (VIAGRA ) 50 MG tablet, Take 1 tablet (50 mg total) by mouth daily as needed for erectile dysfunction., Disp: 30 tablet, Rfl: 1   spironolactone  (ALDACTONE) 25 MG tablet, Take 25 mg by mouth daily., Disp: , Rfl:   Past Medical History: Past Medical History:  Diagnosis Date   Allergy    CHF (congestive heart failure) (HCC)    HFpEF   Chronic kidney disease    COPD (chronic obstructive pulmonary disease) (HCC) Dx 2015   Depression    Eczema    since childhood    Emphysema of lung (HCC)    Erectile dysfunction 08/18/2019   GERD (gastroesophageal reflux disease) 01/25/2020   Hyperlipidemia 01/12/2018   Hypertension    Oxygen  deficiency    Screening for HIV (human immunodeficiency virus)    Sleep apnea    CPAP   Substance abuse (HCC)    MJ, cocaine   Testicular failure 07/22/2019    Tobacco Use: Social History   Tobacco Use  Smoking Status Former   Current packs/day: 0.00   Average packs/day: 1 pack/day for 38.0 years (38.0 ttl pk-yrs)   Types: Cigarettes   Start date: 02/20/1982   Quit date: 02/21/2020   Years since quitting: 4.4  Smokeless Tobacco Never    Labs: Review Flowsheet  More data exists      Latest Ref Rng & Units 10/25/2020 05/16/2022 11/20/2022 05/19/2023 01/30/2024  Labs for ITP Cardiac and Pulmonary Rehab  Cholestrol 100 - 199 mg/dL 842  843  839  849  837   LDL (calc) 0 - 99 mg/dL 86  94  99  87  85   HDL-C >39 mg/dL 56  51  47  52  65   Trlycerides 0 - 149 mg/dL 64  52  70  49  60   Hemoglobin A1c 4.8 - 5.6 % 5.8  5.7  5.6  5.7  5.6     Capillary Blood Glucose: Lab Results  Component Value Date   GLUCAP 85 05/12/2024   GLUCAP 84 11/20/2019   GLUCAP 93 05/12/2018   GLUCAP 179 (H) 07/27/2016   GLUCAP 156 (H) 07/27/2016     Pulmonary Assessment Scores:  Pulmonary Assessment Scores     Row Name 05/11/24 1319         ADL UCSD   ADL Phase Entry     SOB Score total 39     Rest 0     Walk 3     Stairs 3     Bath 2     Dress 2     Shop 2       CAT Score   CAT Score 14       UCSD: Self-administered rating of dyspnea associated with activities of daily living (ADLs) 6-point  scale (0 = not at all to 5 = maximal or unable to do because of breathlessness)  Scoring Scores range from 0 to 120.  Minimally important difference is 5 units  CAT: CAT can identify the health impairment of COPD patients and is better correlated with disease progression.  CAT has a scoring range of zero to 40. The CAT score is classified into four groups of low (less than 10), medium (10 - 20), high (21-30) and very high (31-40) based on the impact level of disease on health status. A CAT score over 10 suggests significant symptoms.  A worsening CAT score could be explained by an exacerbation, poor medication adherence, poor inhaler technique, or progression of COPD or comorbid conditions.  CAT MCID is 2 points  mMRC: mMRC (Modified Medical Research Council) Dyspnea Scale is used to assess the degree of baseline functional disability in patients of respiratory disease due to dyspnea. No minimal important difference is established. A decrease in score of 1 point or greater is considered a positive change.   Pulmonary Function Assessment:  Pulmonary Function Assessment - 05/11/24 1311       Pulmonary Function Tests   FVC% 56 %    FEV1% 37 %    FEV1/FVC Ratio 61    DLCO% 50 %          Exercise Target Goals: Exercise Program Goal: Individual exercise prescription set using results from initial 6 min walk test and THRR while considering  patient's activity barriers and safety.   Exercise Prescription Goal: Initial exercise prescription builds to 30-45 minutes a day of aerobic activity, 2-3 days per week.  Home exercise guidelines will be given to patient during program as part of exercise prescription that the participant will acknowledge.  Activity Barriers & Risk Stratification:  Activity Barriers & Cardiac Risk Stratification - 05/11/24 1304       Activity Barriers & Cardiac Risk Stratification   Activity Barriers Muscular Weakness;Deconditioning;Shortness of Breath           6 Minute Walk:  6 Minute Walk     Row Name 05/17/24 1052         6 Minute Walk   Phase Initial     Distance 570 feet     Walk Time 3.46 minutes     # of Rest Breaks 0     MPH 1.87  METS 1.8     RPE 13     Perceived Dyspnea  3     VO2 Peak 6.3     Symptoms Yes (comment)     Comments pt o2 dropped 74 at 3.46 min test stopped     Resting HR 74 bpm     Resting BP 112/78     Resting Oxygen  Saturation  90 %     Exercise Oxygen  Saturation  during 6 min walk 74 %     Max Ex. HR 93 bpm     Max Ex. BP 150/70     2 Minute Post BP 126/70       Interval HR   1 Minute HR 91     2 Minute HR 87     3 Minute HR 93     6 Minute HR 74     2 Minute Post HR 71     Interval Heart Rate? Yes       Interval Oxygen    Interval Oxygen ? Yes     Baseline Oxygen  Saturation % 90 %     1 Minute Oxygen  Saturation % 90 %     1 Minute Liters of Oxygen  0 L     2 Minute Oxygen  Saturation % 86 %     2 Minute Liters of Oxygen  0 L     3 Minute Oxygen  Saturation % 74 %     3 Minute Liters of Oxygen  0 L     6 Minute Oxygen  Saturation % 87 %     6 Minute Liters of Oxygen  0 L     2 Minute Post Oxygen  Saturation % 88 %     2 Minute Post Liters of Oxygen  0 L        Oxygen  Initial Assessment:  Oxygen  Initial Assessment - 05/11/24 1310       Home Oxygen    Home Oxygen  Device Portable Concentrator;Home Concentrator    Sleep Oxygen  Prescription CPAP    Liters per minute 3    Home Exercise Oxygen  Prescription Continuous    Liters per minute 3    Home Resting Oxygen  Prescription Continuous    Liters per minute 3    Compliance with Home Oxygen  Use Yes      Intervention   Short Term Goals To learn and exhibit compliance with exercise, home and travel O2 prescription;To learn and understand importance of monitoring SPO2 with pulse oximeter and demonstrate accurate use of the pulse oximeter.;To learn and understand importance of maintaining oxygen  saturations>88%;To learn and demonstrate proper  pursed lip breathing techniques or other breathing techniques. ;To learn and demonstrate proper use of respiratory medications    Long  Term Goals Exhibits compliance with exercise, home  and travel O2 prescription;Verbalizes importance of monitoring SPO2 with pulse oximeter and return demonstration;Maintenance of O2 saturations>88%;Exhibits proper breathing techniques, such as pursed lip breathing or other method taught during program session;Compliance with respiratory medication;Demonstrates proper use of MDI's          Oxygen  Re-Evaluation:  Oxygen  Re-Evaluation     Row Name 06/08/24 1038 07/01/24 1200 08/10/24 1100         Program Oxygen  Prescription   Program Oxygen  Prescription -- Continuous Continuous     Liters per minute -- 3 3       Home Oxygen    Home Oxygen  Device -- Portable Concentrator;Home Concentrator;E-Tanks Portable Concentrator;Home Concentrator;E-Tanks     Sleep Oxygen  Prescription -- Continuous;CPAP Continuous;CPAP     Liters per minute --  3 3     Home Exercise Oxygen  Prescription -- Continuous Continuous     Liters per minute -- 3 3     Home Resting Oxygen  Prescription -- Continuous Continuous     Liters per minute -- 3 3     Compliance with Home Oxygen  Use -- -- Yes       Goals/Expected Outcomes   Short Term Goals -- To learn and exhibit compliance with exercise, home and travel O2 prescription;To learn and understand importance of monitoring SPO2 with pulse oximeter and demonstrate accurate use of the pulse oximeter.;To learn and understand importance of maintaining oxygen  saturations>88%;To learn and demonstrate proper pursed lip breathing techniques or other breathing techniques. ;To learn and demonstrate proper use of respiratory medications To learn and exhibit compliance with exercise, home and travel O2 prescription;To learn and understand importance of monitoring SPO2 with pulse oximeter and demonstrate accurate use of the pulse oximeter.;To learn and  understand importance of maintaining oxygen  saturations>88%;To learn and demonstrate proper pursed lip breathing techniques or other breathing techniques. ;To learn and demonstrate proper use of respiratory medications     Long  Term Goals -- Exhibits compliance with exercise, home  and travel O2 prescription;Verbalizes importance of monitoring SPO2 with pulse oximeter and return demonstration;Exhibits proper breathing techniques, such as pursed lip breathing or other method taught during program session;Maintenance of O2 saturations>88%;Compliance with respiratory medication;Demonstrates proper use of MDI's Exhibits compliance with exercise, home  and travel O2 prescription;Verbalizes importance of monitoring SPO2 with pulse oximeter and return demonstration;Exhibits proper breathing techniques, such as pursed lip breathing or other method taught during program session;Maintenance of O2 saturations>88%;Compliance with respiratory medication;Demonstrates proper use of MDI's     Comments Reviewed PLB technique with pt.  Talked about how it works and it's importance in maintaining their exercise saturations. Trysten continues to keep a check on his oxygen  saturations at home. He wears his cpap at bedtime. He notes he breathing is good here and at home, noting when he feels exerted he stops and does his PLB techniques. Wyn is doing well in rehab. He just came back from a two week break from his breathing being rough with allergies. He states it is a lot easier to breathe in the cooler months. When he feels SOB he uses his PLB techniques.     Goals/Expected Outcomes Short: Become more profiecient at using PLB.   Long: Become independent at using PLB. Short: continue to attand rehab. Long: continue using PLB techniques when needed. Short: continue to attand rehab. Long: continue using PLB techniques when needed.        Oxygen  Discharge (Final Oxygen  Re-Evaluation):  Oxygen  Re-Evaluation - 08/10/24 1100        Program Oxygen  Prescription   Program Oxygen  Prescription Continuous    Liters per minute 3      Home Oxygen    Home Oxygen  Device Portable Concentrator;Home Concentrator;E-Tanks    Sleep Oxygen  Prescription Continuous;CPAP    Liters per minute 3    Home Exercise Oxygen  Prescription Continuous    Liters per minute 3    Home Resting Oxygen  Prescription Continuous    Liters per minute 3    Compliance with Home Oxygen  Use Yes      Goals/Expected Outcomes   Short Term Goals To learn and exhibit compliance with exercise, home and travel O2 prescription;To learn and understand importance of monitoring SPO2 with pulse oximeter and demonstrate accurate use of the pulse oximeter.;To learn and understand importance of maintaining oxygen  saturations>88%;To  learn and demonstrate proper pursed lip breathing techniques or other breathing techniques. ;To learn and demonstrate proper use of respiratory medications    Long  Term Goals Exhibits compliance with exercise, home  and travel O2 prescription;Verbalizes importance of monitoring SPO2 with pulse oximeter and return demonstration;Exhibits proper breathing techniques, such as pursed lip breathing or other method taught during program session;Maintenance of O2 saturations>88%;Compliance with respiratory medication;Demonstrates proper use of MDI's    Comments Hoke is doing well in rehab. He just came back from a two week break from his breathing being rough with allergies. He states it is a lot easier to breathe in the cooler months. When he feels SOB he uses his PLB techniques.    Goals/Expected Outcomes Short: continue to attand rehab. Long: continue using PLB techniques when needed.          Initial Exercise Prescription:  Initial Exercise Prescription - 05/17/24 1000       Date of Initial Exercise RX and Referring Provider   Date 05/17/24    Referring Provider Theophilus Roosevelt MD      Oxygen    Oxygen  Continuous    Liters 3    Maintain Oxygen   Saturation 88% or higher      Treadmill   MPH 1.2    Grade 0    Minutes 15    METs 1.92      REL-XR   Level 1    Speed 50    Minutes 15    METs 1.9      Prescription Details   Frequency (times per week) 2    Duration Progress to 30 minutes of continuous aerobic without signs/symptoms of physical distress      Intensity   THRR 40-80% of Max Heartrate 108-143    Ratings of Perceived Exertion 11-13    Perceived Dyspnea 0-4      Resistance Training   Training Prescription Yes    Weight 7    Reps 10-15          Perform Capillary Blood Glucose checks as needed.  Exercise Prescription Changes:   Exercise Prescription Changes     Row Name 05/17/24 1100 06/29/24 1500 07/29/24 1500         Response to Exercise   Blood Pressure (Admit) 112/78 105/70 122/62     Blood Pressure (Exercise) 150/70 134/76 --     Blood Pressure (Exit) 126/70 118/60 112/70     Heart Rate (Admit) 74 bpm 78 bpm 63 bpm     Heart Rate (Exercise) 93 bpm 100 bpm 84 bpm     Heart Rate (Exit) 71 bpm 77 bpm 73 bpm     Oxygen  Saturation (Admit) 90 % 91 % 90 %     Oxygen  Saturation (Exercise) 74 % 87 % 87 %     Oxygen  Saturation (Exit) 88 % 94 % 91 %     Rating of Perceived Exertion (Exercise) 13 12 11      Perceived Dyspnea (Exercise) 3 2 1      Duration Progress to 30 minutes of  aerobic without signs/symptoms of physical distress Continue with 30 min of aerobic exercise without signs/symptoms of physical distress. Continue with 30 min of aerobic exercise without signs/symptoms of physical distress.     Intensity THRR unchanged THRR unchanged THRR unchanged       Progression   Progression -- Continue to progress workloads to maintain intensity without signs/symptoms of physical distress. Continue to progress workloads to maintain intensity without signs/symptoms of physical  distress.       Resistance Training   Training Prescription -- Yes Yes     Weight -- 7 7     Reps -- 10-15 10-15       Oxygen     Oxygen  -- Continuous Continuous     Liters -- 4 4       Treadmill   MPH -- 1.7 1     Grade -- 0 0     Minutes -- 15 15     METs -- 2.3 1.77       REL-XR   Level -- 1 3     Speed -- 46 94     Minutes -- 15 15     METs -- 2.1 2.6       Oxygen    Maintain Oxygen  Saturation -- 88% or higher 88% or higher        Exercise Comments:   Exercise Comments     Row Name 05/11/24 1310 05/17/24 1029 06/08/24 1037       Exercise Comments Torres currently doesn't do any home exercise besides ADL's and walking his dog. Patient attend orientation today.  Patient is attending Pulmonary Rehabilitation Program.  Documentation for diagnosis can be found in [facility].  Reviewed medical chart, RPE/RPD, gym safety, and program guidelines.  Patient was fitted to equipment they will be using during rehab.  Patient is scheduled to start exercise on 06/08/24.   Initial ITP created and sent for review and signature by Dr. Anton Kelp, Medical Director for Pulmonary Rehabilitation Program. First full day of exercise!  Patient was oriented to gym and equipment including functions, settings, policies, and procedures.  Patient's individual exercise prescription and treatment plan were reviewed.  All starting workloads were established based on the results of the 6 minute walk test done at initial orientation visit.  The plan for exercise progression was also introduced and progression will be customized based on patient's performance and goals.        Exercise Goals and Review:   Exercise Goals     Row Name 05/11/24 1309             Exercise Goals   Increase Physical Activity Yes       Intervention Provide advice, education, support and counseling about physical activity/exercise needs.;Develop an individualized exercise prescription for aerobic and resistive training based on initial evaluation findings, risk stratification, comorbidities and participant's personal goals.       Expected Outcomes Short Term:  Attend rehab on a regular basis to increase amount of physical activity.;Long Term: Add in home exercise to make exercise part of routine and to increase amount of physical activity.;Long Term: Exercising regularly at least 3-5 days a week.       Increase Strength and Stamina Yes       Intervention Provide advice, education, support and counseling about physical activity/exercise needs.;Develop an individualized exercise prescription for aerobic and resistive training based on initial evaluation findings, risk stratification, comorbidities and participant's personal goals.       Expected Outcomes Short Term: Increase workloads from initial exercise prescription for resistance, speed, and METs.;Short Term: Perform resistance training exercises routinely during rehab and add in resistance training at home;Long Term: Improve cardiorespiratory fitness, muscular endurance and strength as measured by increased METs and functional capacity ( )       Able to understand and use rate of perceived exertion (RPE) scale Yes       Intervention Provide education and explanation on how to  use RPE scale       Expected Outcomes Short Term: Able to use RPE daily in rehab to express subjective intensity level;Long Term:  Able to use RPE to guide intensity level when exercising independently       Able to understand and use Dyspnea scale Yes       Intervention Provide education and explanation on how to use Dyspnea scale       Expected Outcomes Short Term: Able to use Dyspnea scale daily in rehab to express subjective sense of shortness of breath during exertion;Long Term: Able to use Dyspnea scale to guide intensity level when exercising independently       Knowledge and understanding of Target Heart Rate Range (THRR) Yes       Intervention Provide education and explanation of THRR including how the numbers were predicted and where they are located for reference       Expected Outcomes Short Term: Able to state/look up  THRR;Short Term: Able to use daily as guideline for intensity in rehab;Long Term: Able to use THRR to govern intensity when exercising independently       Able to check pulse independently Yes       Intervention Provide education and demonstration on how to check pulse in carotid and radial arteries.;Review the importance of being able to check your own pulse for safety during independent exercise       Expected Outcomes Short Term: Able to explain why pulse checking is important during independent exercise;Long Term: Able to check pulse independently and accurately       Understanding of Exercise Prescription Yes       Intervention Provide education, explanation, and written materials on patient's individual exercise prescription       Expected Outcomes Short Term: Able to explain program exercise prescription;Long Term: Able to explain home exercise prescription to exercise independently          Exercise Goals Re-Evaluation :  Exercise Goals Re-Evaluation     Row Name 06/08/24 1037 07/01/24 0941 07/01/24 1155 08/03/24 0838 08/10/24 1101     Exercise Goal Re-Evaluation   Exercise Goals Review Able to understand and use rate of perceived exertion (RPE) scale;Knowledge and understanding of Target Heart Rate Range (THRR) Increase Physical Activity;Increase Strength and Stamina;Understanding of Exercise Prescription Increase Physical Activity;Increase Strength and Stamina;Able to understand and use Dyspnea scale;Understanding of Exercise Prescription;Knowledge and understanding of Target Heart Rate Range (THRR);Able to understand and use rate of perceived exertion (RPE) scale Increase Physical Activity;Increase Strength and Stamina;Understanding of Exercise Prescription Increase Physical Activity;Increase Strength and Stamina;Able to understand and use Dyspnea scale;Knowledge and understanding of Target Heart Rate Range (THRR);Able to understand and use rate of perceived exertion (RPE)  scale;Understanding of Exercise Prescription   Comments Reviewed RPE and dyspnea scale, THR and program prescription with pt today.  Pt voiced understanding and was given a copy of goals to take home. Naythan has completed 7 sessions of rehab. He has been doing well with the program . He has had to increase his oxygen  to 4L during exercise due to excerting hisself. Will continue to montior and progress as able Libero is doing great in rehab. He notes he doesn't really exercise at home but he does do lots of things around the house. Spoke with him about maybe starting to walk his street, even with his dog, also did tell him to make sure the weather was ok when he did so his breathing would be ok. Regina has completed  13 sessions of PR. He has be increasing his level on the nustep and has high spm. Is is still walking at a speed of 1.0 MPH on the treadmill but does not need to take as many breaks. Will continue to moniotr and progress as able. Yuvin is doing well in rehab. He is doing some walking at home. He is increasing his levels on the machines here at rehab when his oxygen  allows.   Expected Outcomes Short: Use RPE daily to regulate intensity.  Long: Follow program prescription in THR. Short: contiiue to increase levels and speed on treadmill   long: continue to attend rehab Short: continue to attend rehab. Long: walk more at home. Short: continue to walk the treadmill  long: continue to atted rehab and exercise Short: Continue to attend rehab. Long: Increase exercise at home.      Discharge Exercise Prescription (Final Exercise Prescription Changes):  Exercise Prescription Changes - 07/29/24 1500       Response to Exercise   Blood Pressure (Admit) 122/62    Blood Pressure (Exit) 112/70    Heart Rate (Admit) 63 bpm    Heart Rate (Exercise) 84 bpm    Heart Rate (Exit) 73 bpm    Oxygen  Saturation (Admit) 90 %    Oxygen  Saturation (Exercise) 87 %    Oxygen  Saturation (Exit) 91 %    Rating of Perceived  Exertion (Exercise) 11    Perceived Dyspnea (Exercise) 1    Duration Continue with 30 min of aerobic exercise without signs/symptoms of physical distress.    Intensity THRR unchanged      Progression   Progression Continue to progress workloads to maintain intensity without signs/symptoms of physical distress.      Resistance Training   Training Prescription Yes    Weight 7    Reps 10-15      Oxygen    Oxygen  Continuous    Liters 4      Treadmill   MPH 1    Grade 0    Minutes 15    METs 1.77      REL-XR   Level 3    Speed 94    Minutes 15    METs 2.6      Oxygen    Maintain Oxygen  Saturation 88% or higher          Nutrition:  Target Goals: Understanding of nutrition guidelines, daily intake of sodium 1500mg , cholesterol 200mg , calories 30% from fat and 7% or less from saturated fats, daily to have 5 or more servings of fruits and vegetables.  Biometrics:  Pre Biometrics - 05/17/24 1102       Pre Biometrics   Height 5' 9 (1.753 m)    Weight 238 lb 1.6 oz (108 kg)    Waist Circumference 47 inches    Hip Circumference 42 inches    Waist to Hip Ratio 1.12 %    BMI (Calculated) 35.14    Grip Strength 44.2 kg           Nutrition Therapy Plan and Nutrition Goals:  Nutrition Therapy & Goals - 05/11/24 1313       Intervention Plan   Intervention Prescribe, educate and counsel regarding individualized specific dietary modifications aiming towards targeted core components such as weight, hypertension, lipid management, diabetes, heart failure and other comorbidities.;Nutrition handout(s) given to patient.    Expected Outcomes Short Term Goal: Understand basic principles of dietary content, such as calories, fat, sodium, cholesterol and nutrients.;Short Term Goal: A plan has  been developed with personal nutrition goals set during dietitian appointment.;Long Term Goal: Adherence to prescribed nutrition plan.          Nutrition Assessments:  MEDIFICTS Score  Key: >=70 Need to make dietary changes  40-70 Heart Healthy Diet <= 40 Therapeutic Level Cholesterol Diet  Flowsheet Row PULMONARY VIRTUAL BASED CARE from 05/11/2024 in [facility] CARDIAC REHABILITATION  Picture Your Plate Total Score on Admission 49   Picture Your Plate Scores: <59 Unhealthy dietary pattern with much room for improvement. 41-50 Dietary pattern unlikely to meet recommendations for good health and room for improvement. 51-60 More healthful dietary pattern, with some room for improvement.  >60 Healthy dietary pattern, although there may be some specific behaviors that could be improved.    Nutrition Goals Re-Evaluation:  Nutrition Goals Re-Evaluation     Row Name 07/01/24 1145 08/10/24 1057           Goals   Nutrition Goal Short: start to incorporate more fiber in his diet. Long: eat 3 well-balanced meals per day. Healthy eating      Comment Deavin notes he is not eating the best at home, he does not eat much and not very well-balanced meals, he does like to eat fish and chicken and salads. I talked with Job about continuing fish and chicken for protein, and maybe switching his salads with iceberg lettuce to more dark-green leafy. Tilford also complains of constipation, so I spoke with him about his fiber intake, I mentioned metamucial supplement and foods that are high in fiber. Aloysious notes he is not eating the best at home, he does not eat much and not very well-balanced meals, he does like to eat fish and chicken and salads. I talked with Tripp about continuing fish and chicken for protein, and maybe switching his salads with iceberg lettuce to more dark-green leafy. Gemini also complains of constipation, so I spoke with him about his fiber intake, I mentioned metamucial supplement and foods that are high in fiber.      Expected Outcome Short: start to incorporate more fiber in his diet. Long: eat 3 well-balanced meals per day. Short: Continue to attend rehab. Long: Incorporate  more fiber into his diet.         Nutrition Goals Discharge (Final Nutrition Goals Re-Evaluation):  Nutrition Goals Re-Evaluation - 08/10/24 1057       Goals   Nutrition Goal Healthy eating    Comment Voyd notes he is not eating the best at home, he does not eat much and not very well-balanced meals, he does like to eat fish and chicken and salads. I talked with Faysal about continuing fish and chicken for protein, and maybe switching his salads with iceberg lettuce to more dark-green leafy. Mckenna also complains of constipation, so I spoke with him about his fiber intake, I mentioned metamucial supplement and foods that are high in fiber.    Expected Outcome Short: Continue to attend rehab. Long: Incorporate more fiber into his diet.          Psychosocial: Target Goals: Acknowledge presence or absence of significant depression and/or stress, maximize coping skills, provide positive support system. Participant is able to verbalize types and ability to use techniques and skills needed for reducing stress and depression.  Initial Review & Psychosocial Screening:  Initial Psych Review & Screening - 05/11/24 1313       Initial Review   Current issues with None Identified      Family Dynamics   Good  Support System? Yes    Comments Voshon didn't specify who his support system was, but he states he has people to help him if needed.      Barriers   Psychosocial barriers to participate in program There are no identifiable barriers or psychosocial needs.      Screening Interventions   Interventions Encouraged to exercise    Expected Outcomes Short Term goal: Identification and review with participant of any Quality of Life or Depression concerns found by scoring the questionnaire.;Long Term goal: The participant improves quality of Life and PHQ9 Scores as seen by post scores and/or verbalization of changes;Long Term Goal: Stressors or current issues are controlled or eliminated.;Short Term  goal: Utilizing psychosocial counselor, staff and physician to assist with identification of specific Stressors or current issues interfering with healing process. Setting desired goal for each stressor or current issue identified.          Quality of Life Scores:  Scores of 19 and below usually indicate a poorer quality of life in these areas.  A difference of  2-3 points is a clinically meaningful difference.  A difference of 2-3 points in the total score of the Quality of Life Index has been associated with significant improvement in overall quality of life, self-image, physical symptoms, and general health in studies assessing change in quality of life.   PHQ-9: Review Flowsheet  More data exists      07/23/2024 06/18/2024 05/17/2024 02/11/2024 01/20/2024  Depression screen PHQ 2/9  Decreased Interest 0 0 0 1 0 0  Down, Depressed, Hopeless 0 0 0 1 0 0  PHQ - 2 Score 0 0 0 2 0 0  Altered sleeping 0 0 0 1 0 0  Tired, decreased energy 0 0 1 2 0 0  Change in appetite 0 0 0 0 0 0  Feeling bad or failure about yourself  0 0 0 1 0 0  Trouble concentrating 0 0 0 0 0 0  Moving slowly or fidgety/restless 0 0 1 2 0 0  Suicidal thoughts 0 0 0 0 0 0  PHQ-9 Score 0 0 2 8 0 0  Difficult doing work/chores Not difficult at all Not difficult at all Not difficult at all Somewhat difficult Not difficult at all Not difficult at all    Details       Multiple values from one day are sorted in reverse-chronological order        Interpretation of Total Score  Total Score Depression Severity:  1-4 = Minimal depression, 5-9 = Mild depression, 10-14 = Moderate depression, 15-19 = Moderately severe depression, 20-27 = Severe depression   Psychosocial Evaluation and Intervention:  Psychosocial Evaluation - 05/11/24 1314       Psychosocial Evaluation & Interventions   Interventions Encouraged to exercise with the program and follow exercise prescription    Comments Murel is a pleasant gentleman who is  coming into rehab for COPD with chronic bronchitis and emphysema. He has been in the program before but it was for cardiac reasoning.  Donald states  that he gets around well and is independent with his ADL's. He does not currently do any home exercise besides ADL's and walking his dog outside. He is on 3L oxygen  as needed, and a CPAP at night with 3L. He is having a colonscopy tomorrow and is not very happy about that, but he is eager to start back our program starting Monday the 14th.    Expected Outcomes Short: Increase strength and  stamina. Long: Get off oxygen  or be able to wean down eventually.    Continue Psychosocial Services  Follow up required by staff          Psychosocial Re-Evaluation:  Psychosocial Re-Evaluation     Row Name 07/01/24 1141 08/10/24 1056           Psychosocial Re-Evaluation   Current issues with None Identified Current Stress Concerns      Comments Breylon is doing great in rehab. Ike notes that he is staying busy at home, and going out and doing things with his wife. Nels is enjoying life and coping well. Ankith is doing well in rehab. He is just now coming back from about a 2 week break. His breathing was a little rough from allergies, yet he sais he has had a lot going on as one of his twin daughters is engaged and had an engagement party. His stress is mainly happy stress regarding his daughters engagement.      Expected Outcomes Short: continue to attend rehab. Long: continue to stay active in activities that make him happy. Short: continue to attend rehab. Long: Enjoy the engagement of his daughter.      Interventions Encouraged to attend Pulmonary Rehabilitation for the exercise Encouraged to attend Pulmonary Rehabilitation for the exercise;Stress management education      Continue Psychosocial Services  Follow up required by staff Follow up required by staff         Psychosocial Discharge (Final Psychosocial Re-Evaluation):  Psychosocial Re-Evaluation -  08/10/24 1056       Psychosocial Re-Evaluation   Current issues with Current Stress Concerns    Comments Ellias is doing well in rehab. He is just now coming back from about a 2 week break. His breathing was a little rough from allergies, yet he sais he has had a lot going on as one of his twin daughters is engaged and had an engagement party. His stress is mainly happy stress regarding his daughters engagement.    Expected Outcomes Short: continue to attend rehab. Long: Enjoy the engagement of his daughter.    Interventions Encouraged to attend Pulmonary Rehabilitation for the exercise;Stress management education    Continue Psychosocial Services  Follow up required by staff           Education: Education Goals: Education classes will be provided on a weekly basis, covering required topics. Participant will state understanding/return demonstration of topics presented.  Learning Barriers/Preferences:  Learning Barriers/Preferences - 05/11/24 1313       Learning Barriers/Preferences   Learning Barriers None    Learning Preferences Video;Written Material;Pictoral          Education Topics: How Lungs Work and Diseases: - Discuss the anatomy of the lungs and diseases that can affect the lungs, such as COPD. Flowsheet Row PULMONARY REHAB OTHER RESPIRATORY from 06/08/2020 in [facility]  Date 03/12/18  Educator D. Coad  Instruction Review Code 2- Demonstrated Understanding    Exercise: -Discuss the importance of exercise, FITT principles of exercise, normal and abnormal responses to exercise, and how to exercise safely. Flowsheet Row PULMONARY REHAB OTHER RESPIRATORY from 06/08/2020 in [place] [facility]  Date 03/05/18  Educator GC  Instruction Review Code 2- Demonstrated Understanding    Environmental Irritants: -Discuss types of environmental irritants and how to limit exposure to environmental irritants. Flowsheet Row PULMONARY REHAB OTHER  RESPIRATORY from 06/08/2020 in [place] [facility]  Date 05/18/20  Educator CHARM Louder  Instruction Review  Code 1- Verbalizes Understanding    Meds/Inhalers and oxygen : - Discuss respiratory medications, definition of an inhaler and oxygen , and the proper way to use an inhaler and oxygen . Flowsheet Row PULMONARY REHAB OTHER RESPIRATORY from 06/08/2020 in [place] [facility]  Date 03/26/18  Educator DC    Energy Saving Techniques: - Discuss methods to conserve energy and decrease shortness of breath when performing activities of daily living.  Flowsheet Row PULMONARY REHAB OTHER RESPIRATORY from 06/08/2020 in [place] [facility]  Date 04/02/18  Educator GC  Instruction Review Code 2- Demonstrated Understanding    Bronchial Hygiene / Breathing Techniques: - Discuss breathing mechanics, pursed-lip breathing technique,  proper posture, effective ways to clear airways, and other functional breathing techniques Flowsheet Row PULMONARY REHAB OTHER RESPIRATORY from 06/08/2020 in [place] [facility]  Date 04/09/18  Educator DC  Instruction Review Code 2- Demonstrated Understanding    Cleaning Equipment: - Provides group verbal and written instruction about the health risks of elevated stress, cause of high stress, and healthy ways to reduce stress. Flowsheet Row PULMONARY REHAB OTHER RESPIRATORY from 06/08/2020 in [place] [facility]  Date 01/15/18  Educator GC  Instruction Review Code 2- Demonstrated Understanding    Nutrition I: Fats: - Discuss the types of cholesterol, what cholesterol does to the body, and how cholesterol levels can be controlled. Flowsheet Row PULMONARY REHAB OTHER RESPIRATORY from 06/08/2020 in [place] [facility]  Date 01/22/18  Educator GC  Instruction Review Code 2- Demonstrated Understanding    Nutrition II: Labels: -Discuss the different components of food labels and how to read  food labels. Flowsheet Row PULMONARY REHAB OTHER RESPIRATORY from 06/08/2020 in [place] [facility]  Date 01/29/18  Educator DJ  Instruction Review Code 2- Demonstrated Understanding    Respiratory Infections: - Discuss the signs and symptoms of respiratory infections, ways to prevent respiratory infections, and the importance of seeking medical treatment when having a respiratory infection. Flowsheet Row PULMONARY REHAB OTHER RESPIRATORY from 06/08/2020 in [place] [facility]  Date 02/05/18  Educator GC  Instruction Review Code 2- Demonstrated Understanding    Stress I: Signs and Symptoms: - Discuss the causes of stress, how stress may lead to anxiety and depression, and ways to limit stress.   Stress II: Relaxation: -Discuss relaxation techniques to limit stress. Flowsheet Row PULMONARY REHAB OTHER RESPIRATORY from 06/08/2020 in [place] [facility]  Date 04/20/20  Educator DF  Instruction Review Code 2- Demonstrated Understanding    Oxygen  for Home/Travel: - Discuss how to prepare for travel when on oxygen  and proper ways to transport and store oxygen  to ensure safety.   Knowledge Questionnaire Score:  Knowledge Questionnaire Score - 05/11/24 1324       Knowledge Questionnaire Score   Pre Score 15/18          Core Components/Risk Factors/Patient Goals at Admission:  Personal Goals and Risk Factors at Admission - 05/11/24 1312       Core Components/Risk Factors/Patient Goals on Admission   Improve shortness of breath with ADL's Yes    Intervention Provide education, individualized exercise plan and daily activity instruction to help decrease symptoms of SOB with activities of daily living.    Expected Outcomes Short Term: Improve cardiorespiratory fitness to achieve a reduction of symptoms when performing ADLs;Long Term: Be able to perform more ADLs without symptoms or delay the onset of symptoms    Increase knowledge of  respiratory medications and ability to use respiratory devices properly  Yes    Intervention Provide education and demonstration as needed of appropriate use of medications, inhalers, and oxygen  therapy.    Expected Outcomes Long Term: Maintain appropriate use of medications, inhalers, and oxygen  therapy.;Short Term: Achieves understanding of medications use. Understands that oxygen  is a medication prescribed by physician. Demonstrates appropriate use of inhaler and oxygen  therapy.    Heart Failure Yes    Intervention Provide a combined exercise and nutrition program that is supplemented with education, support and counseling about heart failure. Directed toward relieving symptoms such as shortness of breath, decreased exercise tolerance, and extremity edema.    Expected Outcomes Improve functional capacity of life;Short term: Attendance in program 2-3 days a week with increased exercise capacity. Reported lower sodium intake. Reported increased fruit and vegetable intake. Reports medication compliance.;Short term: Daily weights obtained and reported for increase. Utilizing diuretic protocols set by physician.;Long term: Adoption of self-care skills and reduction of barriers for early signs and symptoms recognition and intervention leading to self-care maintenance.    Hypertension Yes    Intervention Provide education on lifestyle modifcations including regular physical activity/exercise, weight management, moderate sodium restriction and increased consumption of fresh fruit, vegetables, and low fat dairy, alcohol moderation, and smoking cessation.;Monitor prescription use compliance.    Expected Outcomes Short Term: Continued assessment and intervention until BP is < 140/1mm HG in hypertensive participants. < 130/29mm HG in hypertensive participants with diabetes, heart failure or chronic kidney disease.;Long Term: Maintenance of blood pressure at goal levels.    Lipids Yes    Intervention Provide  education and support for participant on nutrition & aerobic/resistive exercise along with prescribed medications to achieve LDL 70mg , HDL >40mg .    Expected Outcomes Long Term: Cholesterol controlled with medications as prescribed, with individualized exercise RX and with personalized nutrition plan. Value goals: LDL < 70mg , HDL > 40 mg.;Short Term: Participant states understanding of desired cholesterol values and is compliant with medications prescribed. Participant is following exercise prescription and nutrition guidelines.          Core Components/Risk Factors/Patient Goals Review:   Goals and Risk Factor Review     Row Name 07/01/24 1151 08/10/24 1058           Core Components/Risk Factors/Patient Goals Review   Personal Goals Review Improve shortness of breath with ADL's;Develop more efficient breathing techniques such as purse lipped breathing and diaphragmatic breathing and practicing self-pacing with activity.;Increase knowledge of respiratory medications and ability to use respiratory devices properly.;Weight Management/Obesity Improve shortness of breath with ADL's;Develop more efficient breathing techniques such as purse lipped breathing and diaphragmatic breathing and practicing self-pacing with activity.;Increase knowledge of respiratory medications and ability to use respiratory devices properly.;Weight Management/Obesity      Review Aeson is doing great in rehab. Trystyn has COPD and is on 3L/min via nasal cannula, continous, and wears a CPAP at bedtime; which are all working very well for him. Korey has noted some issues with constipation and more gas. We spoke about him following up with his GI doctor. Edge is doing well in rehab. He just came back from a two week break from his breathing being rough with allergies. He states it is a lot easier to breathe in the cooler months. He is taking all his medications as prescribed and checks his BP and O2 at home.      Expected Outcomes  Short: follow up with his GI doctor. Long: continue to wear his oxygen  devices as prescribed. Short: Continue to attend rehab. Long: Continue  to wear his oxygen  devices as prescribed.         Core Components/Risk Factors/Patient Goals at Discharge (Final Review):   Goals and Risk Factor Review - 08/10/24 1058       Core Components/Risk Factors/Patient Goals Review   Personal Goals Review Improve shortness of breath with ADL's;Develop more efficient breathing techniques such as purse lipped breathing and diaphragmatic breathing and practicing self-pacing with activity.;Increase knowledge of respiratory medications and ability to use respiratory devices properly.;Weight Management/Obesity    Review Lenn is doing well in rehab. He just came back from a two week break from his breathing being rough with allergies. He states it is a lot easier to breathe in the cooler months. He is taking all his medications as prescribed and checks his BP and O2 at home.    Expected Outcomes Short: Continue to attend rehab. Long: Continue to wear his oxygen  devices as prescribed.          ITP Comments:  ITP Comments     Row Name 05/11/24 1308 05/17/24 1029 05/19/24 0925 06/08/24 1037 06/16/24 1150   ITP Comments Completed virtual orientation today.  EP evaluation is scheduled for 05/17/24 at 1000 .  Documentation for diagnosis can be found in [facility] encounter 04/28/24. Patient attend orientation today.  Patient is attending Pulmonary Rehabilitation Program.  Documentation for diagnosis can be found in [facility].  Reviewed medical chart, RPE/RPD, gym safety, and program guidelines.  Patient was fitted to equipment they will be using during rehab.  Patient is scheduled to start exercise on 06/08/24.   Initial ITP created and sent for review and signature by Dr. Anton Kelp, Medical Director for Pulmonary Rehabilitation Program. 30 day review completed. ITP sent to Dr.Jehanzeb Memon, Medical Director of  Pulmonary Rehab.  Continue with ITP unless changes are made by physician.  New to program, only attended orientation thus far. First full day of exercise!  Patient was oriented to gym and equipment including functions, settings, policies, and procedures.  Patient's individual exercise prescription and treatment plan were reviewed.  All starting workloads were established based on the results of the 6 minute walk test done at initial orientation visit.  The plan for exercise progression was also introduced and progression will be customized based on patient's performance and goals. 30 day review completed. ITP sent to Dr.Jehanzeb Memon, Medical Director of  Pulmonary Rehab. Continue with ITP unless changes are made by physician.   Still new to program with start delayed to 06/08/24 and has only completed two exercise sessions thus far.    Row Name 07/14/24 1507           ITP Comments 30 day review completed. ITP sent to Dr.Jehanzeb Memon, Medical Director of  Pulmonary Rehab. Continue with ITP unless changes are made by physician.          Comments: 30 day review

## 2024-08-12 ENCOUNTER — Encounter (HOSPITAL_COMMUNITY)
Admission: RE | Admit: 2024-08-12 | Discharge: 2024-08-12 | Disposition: A | Source: Ambulatory Visit | Attending: Pulmonary Disease

## 2024-08-12 DIAGNOSIS — J439 Emphysema, unspecified: Secondary | ICD-10-CM

## 2024-08-12 DIAGNOSIS — J4489 Other specified chronic obstructive pulmonary disease: Secondary | ICD-10-CM | POA: Diagnosis not present

## 2024-08-12 NOTE — Progress Notes (Signed)
 Daily Session Note  Patient Details  Name: Ricardo Burch MRN: [id number] Date of Birth: Apr 29, 1961 Referring Provider:   Flowsheet Row PULMONARY REHAB OTHER RESP ORIENTATION from 05/17/2024 in [facility] C[facility]IAC REHABILITATION  Referring Provider Theophilus Roosevelt MD    Encounter Date: 08/12/2024  Check In:  Session Check In - 08/12/24 1026       Check-In   Supervising physician immediately available to respond to emergencies See telemetry face sheet for immediately available MD    Location [facility]    Staff Present Powell Benders, BS, Exercise Physiologist;Debra Vicci, RN, BSN;Hillary Troutman BSN, RN    Virtual Visit No    Medication changes reported     No    Fall or balance concerns reported    No    Tobacco Cessation No Change    Warm-up and Cool-down Performed on first and last piece of equipment    Resistance Training Performed Yes    VAD Patient? No    PAD/SET Patient? No      Pain Assessment   Currently in Pain? No/denies    Pain Score 0-No pain    Multiple Pain Sites No          Capillary Blood Glucose: No results found for this or any previous visit (from the past 24 hours).    Social History   Tobacco Use  Smoking Status Former   Current packs/day: 0.00   Average packs/day: 1 pack/day for 38.0 years (38.0 ttl pk-yrs)   Types: Cigarettes   Start date: 02/20/1982   Quit date: 02/21/2020   Years since quitting: 4.4  Smokeless Tobacco Never    Goals Met:  Independence with exercise equipment Exercise tolerated well No report of concerns or symptoms today Strength training completed today  Goals Unmet:  Not Applicable  Comments: Pt able to follow exercise prescription today without complaint.  Will continue to monitor for progression.

## 2024-08-17 ENCOUNTER — Encounter (HOSPITAL_COMMUNITY)
Admission: RE | Admit: 2024-08-17 | Discharge: 2024-08-17 | Disposition: A | Discharge status: Home / Self Care | Source: Ambulatory Visit | Attending: Pulmonary Disease | Admitting: Pulmonary Disease

## 2024-08-17 DIAGNOSIS — J439 Emphysema, unspecified: Secondary | ICD-10-CM

## 2024-08-17 DIAGNOSIS — J4489 Other specified chronic obstructive pulmonary disease: Secondary | ICD-10-CM | POA: Diagnosis not present

## 2024-08-17 NOTE — Progress Notes (Signed)
 Daily Session Note  Patient Details  Name: Ricardo Burch MRN: [id number] Date of Birth: 11-Jan-1961 Referring Provider:   Flowsheet Row PULMONARY REHAB OTHER RESP ORIENTATION from 05/17/2024 in [facility] CARDIAC REHABILITATION  Referring Provider Theophilus Roosevelt MD    Encounter Date: 08/17/2024  Check In:  Session Check In - 08/17/24 1015       Check-In   Supervising physician immediately available to respond to emergencies See telemetry face sheet for immediately available MD    Location [facility]    Staff Present Adrien Louder, RN, BSN;Heather Con, BS, Exercise Physiologist;Brittany Jackquline, BSN, RN, WTA-C    Virtual Visit No    Medication changes reported     No    Fall or balance concerns reported    No    Warm-up and Cool-down Performed on first and last piece of equipment    Resistance Training Performed Yes    VAD Patient? No      Pain Assessment   Currently in Pain? No/denies    Pain Score 0-No pain    Multiple Pain Sites No          Capillary Blood Glucose: No results found for this or any previous visit (from the past 24 hours).    Social History   Tobacco Use  Smoking Status Former   Current packs/day: 0.00   Average packs/day: 1 pack/day for 38.0 years (38.0 ttl pk-yrs)   Types: Cigarettes   Start date: 02/20/1982   Quit date: 02/21/2020   Years since quitting: 4.4  Smokeless Tobacco Never    Goals Met:  Proper associated with RPD/PD & O2 Sat Independence with exercise equipment Using PLB without cueing & demonstrates good technique Exercise tolerated well No report of concerns or symptoms today Strength training completed today  Goals Unmet:  Not Applicable  Comments: Pt able to follow exercise prescription today without complaint.  Will continue to monitor for progression.

## 2024-08-19 ENCOUNTER — Encounter (HOSPITAL_COMMUNITY)
Admission: RE | Admit: 2024-08-19 | Discharge: 2024-08-19 | Disposition: A | Discharge status: Home / Self Care | Source: Ambulatory Visit | Attending: Pulmonary Disease | Admitting: Pulmonary Disease

## 2024-08-19 DIAGNOSIS — J4489 Other specified chronic obstructive pulmonary disease: Secondary | ICD-10-CM | POA: Diagnosis not present

## 2024-08-19 DIAGNOSIS — J439 Emphysema, unspecified: Secondary | ICD-10-CM

## 2024-08-19 NOTE — Progress Notes (Signed)
 Daily Session Note  Patient Details  Name: Ricardo Burch MRN: [id number] Date of Birth: July 05, 1961 Referring Provider:   Flowsheet Row PULMONARY REHAB OTHER RESP ORIENTATION from 05/17/2024 in [facility] CARDIAC REHABILITATION  Referring Provider Theophilus Roosevelt MD    Encounter Date: 08/19/2024  Check In:  Session Check In - 08/19/24 1033       Check-In   Supervising physician immediately available to respond to emergencies See telemetry face sheet for immediately available MD    Location [facility]    Staff Present Powell Benders, BS, Exercise Physiologist;Hillary Dean BSN, RN    Virtual Visit No    Medication changes reported     No    Fall or balance concerns reported    No    Warm-up and Cool-down Performed on first and last piece of equipment    Resistance Training Performed Yes    VAD Patient? No    PAD/SET Patient? No      Pain Assessment   Currently in Pain? No/denies          Capillary Blood Glucose: No results found for this or any previous visit (from the past 24 hours).    Social History   Tobacco Use  Smoking Status Former   Current packs/day: 0.00   Average packs/day: 1 pack/day for 38.0 years (38.0 ttl pk-yrs)   Types: Cigarettes   Start date: 02/20/1982   Quit date: 02/21/2020   Years since quitting: 4.4  Smokeless Tobacco Never    Goals Met:  Proper associated with RPD/PD & O2 Sat Independence with exercise equipment Using PLB without cueing & demonstrates good technique Exercise tolerated well No report of concerns or symptoms today Strength training completed today  Goals Unmet:  Not Applicable  Comments: Pt able to follow exercise prescription today without complaint.  Will continue to monitor for progression.

## 2024-08-24 ENCOUNTER — Encounter (HOSPITAL_COMMUNITY)
Admission: RE | Admit: 2024-08-24 | Discharge: 2024-08-24 | Disposition: A | Source: Ambulatory Visit | Attending: Pulmonary Disease

## 2024-08-24 DIAGNOSIS — J4489 Other specified chronic obstructive pulmonary disease: Secondary | ICD-10-CM

## 2024-08-24 NOTE — Progress Notes (Signed)
 Daily Session Note  Patient Details  Name: Viviano Bir MRN: [id number] Date of Birth: 15-Aug-1961 Referring Provider:   Flowsheet Row PULMONARY REHAB OTHER RESP ORIENTATION from 05/17/2024 in [facility] CARDIAC REHABILITATION  Referring Provider Theophilus Roosevelt MD    Encounter Date: 08/24/2024  Check In:  Session Check In - 08/24/24 1019       Check-In   Supervising physician immediately available to respond to emergencies See telemetry face sheet for immediately available MD    Location [facility]    Staff Present Powell Benders, BS, Exercise Physiologist;Brittany Jackquline, BSN, RN, WTA-C;Deema Juncaj Zina, RN    Virtual Visit No    Medication changes reported     No    Fall or balance concerns reported    No    Warm-up and Cool-down Performed on first and last piece of equipment    Resistance Training Performed Yes    VAD Patient? No    PAD/SET Patient? No      Pain Assessment   Currently in Pain? No/denies          Capillary Blood Glucose: No results found for this or any previous visit (from the past 24 hours).    Social History   Tobacco Use  Smoking Status Former   Current packs/day: 0.00   Average packs/day: 1 pack/day for 38.0 years (38.0 ttl pk-yrs)   Types: Cigarettes   Start date: 02/20/1982   Quit date: 02/21/2020   Years since quitting: 4.5  Smokeless Tobacco Never    Goals Met:  Proper associated with RPD/PD & O2 Sat Independence with exercise equipment Using PLB without cueing & demonstrates good technique Exercise tolerated well No report of concerns or symptoms today Strength training completed today  Goals Unmet:  Not Applicable  Comments: Pt able to follow exercise prescription today without complaint.  Will continue to monitor for progression.

## 2024-08-25 ENCOUNTER — Ambulatory Visit (HOSPITAL_COMMUNITY)
Admission: RE | Admit: 2024-08-25 | Discharge: 2024-08-25 | Disposition: A | Discharge status: Home / Self Care | Source: Ambulatory Visit | Attending: Internal Medicine | Admitting: Internal Medicine

## 2024-08-25 DIAGNOSIS — I2510 Atherosclerotic heart disease of native coronary artery without angina pectoris: Secondary | ICD-10-CM | POA: Diagnosis not present

## 2024-08-25 DIAGNOSIS — L723 Sebaceous cyst: Secondary | ICD-10-CM | POA: Diagnosis not present

## 2024-08-25 DIAGNOSIS — Z87891 Personal history of nicotine dependence: Secondary | ICD-10-CM | POA: Diagnosis not present

## 2024-08-25 DIAGNOSIS — J439 Emphysema, unspecified: Secondary | ICD-10-CM | POA: Insufficient documentation

## 2024-08-25 DIAGNOSIS — Z122 Encounter for screening for malignant neoplasm of respiratory organs: Secondary | ICD-10-CM | POA: Insufficient documentation

## 2024-08-25 DIAGNOSIS — I700 Atherosclerosis of aorta: Secondary | ICD-10-CM | POA: Insufficient documentation

## 2024-08-25 DIAGNOSIS — M47814 Spondylosis without myelopathy or radiculopathy, thoracic region: Secondary | ICD-10-CM | POA: Insufficient documentation

## 2024-08-25 DIAGNOSIS — F17210 Nicotine dependence, cigarettes, uncomplicated: Secondary | ICD-10-CM | POA: Diagnosis not present

## 2024-08-26 ENCOUNTER — Encounter (HOSPITAL_COMMUNITY)
Admission: RE | Admit: 2024-08-26 | Discharge: 2024-08-26 | Disposition: A | Source: Ambulatory Visit | Attending: Pulmonary Disease

## 2024-08-26 DIAGNOSIS — J4489 Other specified chronic obstructive pulmonary disease: Secondary | ICD-10-CM

## 2024-08-26 NOTE — Progress Notes (Signed)
 Daily Session Note  Patient Details  Name: Ricardo Burch MRN: [id number] Date of Birth: 03/24/1961 Referring Provider:   Flowsheet Row PULMONARY REHAB OTHER RESP ORIENTATION from 05/17/2024 in [facility] CARDIAC REHABILITATION  Referring Provider Theophilus Roosevelt MD    Encounter Date: 08/26/2024  Check In:  Session Check In - 08/26/24 1150       Check-In   Supervising physician immediately available to respond to emergencies See telemetry face sheet for immediately available MD    Location [facility]    Staff Present Powell Benders, BS, Exercise Physiologist;Kristyl Athens Zina, RN;Jessica [place], MA, RCEP, CCRP, CCET    Virtual Visit No    Medication changes reported     No    Fall or balance concerns reported    No    Warm-up and Cool-down Performed on first and last piece of equipment    Resistance Training Performed Yes    VAD Patient? No    PAD/SET Patient? No      Pain Assessment   Currently in Pain? No/denies          Capillary Blood Glucose: No results found for this or any previous visit (from the past 24 hours).    Social History   Tobacco Use  Smoking Status Former   Current packs/day: 0.00   Average packs/day: 1 pack/day for 38.0 years (38.0 ttl pk-yrs)   Types: Cigarettes   Start date: 02/20/1982   Quit date: 02/21/2020   Years since quitting: 4.5  Smokeless Tobacco Never    Goals Met:  Proper associated with RPD/PD & O2 Sat Independence with exercise equipment Using PLB without cueing & demonstrates good technique Exercise tolerated well No report of concerns or symptoms today Strength training completed today  Goals Unmet:  Not Applicable  Comments: Pt able to follow exercise prescription today without complaint.  Will continue to monitor for progression.

## 2024-08-30 ENCOUNTER — Other Ambulatory Visit: Payer: Self-pay

## 2024-08-30 DIAGNOSIS — J449 Chronic obstructive pulmonary disease, unspecified: Secondary | ICD-10-CM | POA: Diagnosis not present

## 2024-08-30 DIAGNOSIS — F17210 Nicotine dependence, cigarettes, uncomplicated: Secondary | ICD-10-CM

## 2024-08-30 DIAGNOSIS — G4733 Obstructive sleep apnea (adult) (pediatric): Secondary | ICD-10-CM | POA: Diagnosis not present

## 2024-08-30 DIAGNOSIS — Z122 Encounter for screening for malignant neoplasm of respiratory organs: Secondary | ICD-10-CM

## 2024-08-30 DIAGNOSIS — Z87891 Personal history of nicotine dependence: Secondary | ICD-10-CM

## 2024-08-31 ENCOUNTER — Encounter (HOSPITAL_COMMUNITY)

## 2024-08-31 ENCOUNTER — Ambulatory Visit: Payer: Self-pay

## 2024-08-31 DIAGNOSIS — G4733 Obstructive sleep apnea (adult) (pediatric): Secondary | ICD-10-CM | POA: Diagnosis not present

## 2024-08-31 DIAGNOSIS — J449 Chronic obstructive pulmonary disease, unspecified: Secondary | ICD-10-CM | POA: Diagnosis not present

## 2024-08-31 NOTE — Telephone Encounter (Signed)
 FYI Only or Action Required?: FYI only for provider.  Patient was last seen in primary care on 07/23/2024 by Tobie Suzzane POUR, MD.  Called Nurse Triage reporting Itchy Eye and Eye Drainage.  Symptoms began over a month ago.  Interventions attempted: Prescription medications: Xyzal , Pataday  and Other: warm compress. Not wearing his contacts.  Symptoms are: bilateral eyes (worse on left) crusting, eyelid swelling along lash line, itching gradually worsening.  Triage Disposition: See PCP When Office is Open (Within 3 Days)  Patient/caregiver understands and will follow disposition?: Yes           Copied from CRM #[id number]. Topic: Clinical - Red Word Triage >> Aug 31, 2024  9:39 AM Jasmin G wrote: [facility] that prompted transfer to Nurse Triage: Pt states that he seen Dr. Tobie about a month ago regarding some allergies, pt is currently experiencing itching and burning around eyelids that started around 2-3 days ago, pt states that he has been putting eye drops but they're not working. Reason for Disposition  [1] Taking allergy medicine > 2 days AND [2] still has pus on eyelids  Answer Assessment - Initial Assessment Questions 1. SEVERITY: How bad is the itching?  (e.g., Scale 1-10; mild, moderate or severe)     Mild. He states itching worse at night.   2. ONSET: When did the eye symptoms start? (e.g., hours or days ago)     Patient was seen on 07/23/24 with Dr Tobie. He states it was starting to improve with treatment but has worsened over the past week.  3. EYELIDS: Are the eyelids swollen? If Yes, ask: How much?     Mild swelling along eyelash line on upper and lower eyelid (both eyes but worse on left eye). Denies eyes being swollen shut.  4. EYE DISCHARGE: Is there any discharge from the eye, or eyelid crusting? If Yes, ask: How much?     Yes, he states there is crusting to left upper eyelid and inner corner/tear duct; right eye crusting as well.  5. TRIGGER:  What do you think triggered the allergic reaction? (e.g., animal dander, dust, pollen, smoke; eyelash extensions, new eye make-up, tattooed eyeliner)     He thinks it could be eczema. He also mentions it could be related to wearing his full face CPAP machine and thinks the fit is loose and might be blowing air into his eyes.  6. RECURRENT PROBLEM: Have you experienced eye allergies before? If Yes, ask: When was the last time? and What medicine or treatment worked best in the past?     Yes, last time was seen in office 07/23/24. Taking Xyzal  and Pataday  eye drops.  7. CONTACT LENS: Do you wear contacts?  Disposable or extended wear? Do you use preservative-free lens solution?     Yes. Has not worn them for about 4-5 days. Disposable, changes every night. He uses Bio True contact lens solution.  8. OTHER SYMPTOMS: Do you have any other symptoms? (e.g., eye redness, runny nose)     Inner left eye corner and outer right eye corner dry skin/scabbing. Denies changes in vision (loss of vision/blurred vision).  Protocols used: Eye - Allergy-A-AH

## 2024-08-31 NOTE — Telephone Encounter (Signed)
Noted patient scheduled

## 2024-09-01 ENCOUNTER — Ambulatory Visit: Admitting: Family Medicine

## 2024-09-01 ENCOUNTER — Encounter: Payer: Self-pay | Admitting: Family Medicine

## 2024-09-01 VITALS — BP 139/83 | HR 74 | Ht 69.0 in | Wt 244.0 lb

## 2024-09-01 DIAGNOSIS — R21 Rash and other nonspecific skin eruption: Secondary | ICD-10-CM

## 2024-09-01 MED ORDER — DESONIDE 0.05 % EX CREA: TOPICAL_CREAM | Freq: Every day | CUTANEOUS | 0 refills | Status: DC | PRN

## 2024-09-01 NOTE — Telephone Encounter (Signed)
Message routed to provider's CMA.

## 2024-09-01 NOTE — Patient Instructions (Signed)
 CeraVe will help as well.  Medication as directed.  Do not use more than 10 days consecutively.

## 2024-09-01 NOTE — Progress Notes (Signed)
 Subjective:  Patient ID: Ricardo Burch, male    DOB: [birth year]  Age: 64 y.o. MRN: [id number]  CC:   Chief Complaint  Patient presents with   eyes    Itchy and crusty eyes    HPI:  63 year old male presents for evaluation of the above.  Patient recently treated for allergic conjunctivitis.  He states that he is having dryness and itching as well as irritation under the lower eyelids bilaterally.  He states that the eyes are not really affected at this time it is just the skin below the eyes.  He has been applying some topical Vaseline with improvement but no resolution.  Patient Active Problem List   Diagnosis Date Noted   Rash 09/01/2024   Allergic conjunctivitis of both eyes 07/23/2024   Vitamin D  deficiency 02/11/2024   Prostate cancer screening 04/10/2023   Chronic constipation 04/10/2023   Need for immunization against influenza 07/09/2022   Encounter for general adult medical examination with abnormal findings 03/05/2022   Chronic fatigue 11/28/2021   DDD (degenerative disc disease), lumbar 11/28/2021   Lung nodule 08/30/2021   Pulmonary hypertension, moderate to severe (HCC) 08/28/2021   GERD (gastroesophageal reflux disease) 01/25/2020   Morbid obesity (HCC) 12/06/2019   Erectile dysfunction 08/18/2019   Chronic diastolic CHF (congestive heart failure) (HCC) 09/23/2018   Hyperlipidemia 01/12/2018   Hypertension 10/14/2017   Aortic atherosclerosis 09/15/2017   Pre-diabetes 09/15/2017   Cocaine abuse (HCC) 07/26/2016   MDD (major depressive disorder), recurrent, in partial remission 07/26/2016   OSA on CPAP 07/26/2016   Alcohol abuse 06/27/2016   COPD (chronic obstructive pulmonary disease) (HCC) 08/23/2014   Eczema 08/23/2014   Right knee pain 08/23/2014   Tobacco abuse 03/27/2014   Chronic respiratory failure with hypoxia (HCC) 03/27/2014    Social Hx   Social History   Socioeconomic History   Marital status: Single    Spouse name: Not on file   Number of  children: 5   Years of education: 12   Highest education level: 12th grade  Occupational History   Occupation: Unemployed     Comment: car wash details  Tobacco Use   Smoking status: Former    Current packs/day: 0.00    Average packs/day: 1 pack/day for 38.0 years (38.0 ttl pk-yrs)    Types: Cigarettes    Start date: 02/20/1982    Quit date: 02/21/2020    Years since quitting: 4.5   Smokeless tobacco: Never  Vaping Use   Vaping status: Never Used  Substance and Sexual Activity   Alcohol use: Yes    Alcohol/week: 1.0 standard drink of alcohol    Types: 1 Cans of beer per week    Comment: Socially x 1/month currently (as of 01/25/20); previously: occassionally: weekends, 6-pack or less on a day; or half pint of liquior   Drug use: Not Currently    Types: Cocaine, Marijuana    Comment: 01/25/20-marijuana few weeks ago, no cocaine (4-6 yeats ago)   Sexual activity: Never  Other Topics Concern   Not on file  Social History Narrative    Lives alone.    5 daughters 20, 76 yo twins, eldest 2 are married live in [place]. Retired.Girlfriend works in Programmer, [employer].   Social Drivers of Health   Financial Resource Strain: Medium Risk (06/14/2024)   Overall Financial Resource Strain (CARDIA)    Difficulty of Paying Living Expenses: Somewhat hard  Food Insecurity: Food Insecurity Present (06/14/2024)   Hunger Vital Sign  Worried About Programme Researcher, Broadcasting/film/video in the Last Year: Sometimes true    [employer] of Food in the Last Year: Sometimes true  Transportation Needs: No Transportation Needs (06/14/2024)   PRAPARE - Administrator, Civil Service (Medical): No    Lack of Transportation (Non-Medical): No  Physical Activity: Sufficiently Active (06/14/2024)   Exercise Vital Sign    Days of Exercise per Week: 5 days    Minutes of Exercise per Session: 100 min  Stress: No Stress Concern Present (06/14/2024)   [employer] of Occupational Health - Occupational Stress Questionnaire     Feeling of Stress: Not at all  Social Connections: Unknown (06/14/2024)   Social Connection and Isolation Panel    Frequency of Communication with Friends and Family: More than three times a week    Frequency of Social Gatherings with Friends and Family: Twice a week    Attends Religious Services: More than 4 times per year    Active Member of [employer] or Organizations: No    Attends Engineer, Structural: Not on file    Marital Status: Not on file    Review of Systems Per HPI  Objective:  BP 139/83   Pulse 74   Ht 5' 9 (1.753 m)   Wt 244 lb (110.7 kg)   SpO2 (!) 83%   BMI 36.03 kg/m      09/01/2024    9:58 AM 07/23/2024    9:29 AM 07/02/2024   10:19 AM  BP/Weight  Systolic BP 139 116 114  Diastolic BP 83 73 73  Wt. (Lbs) 244 239.4 236.08  BMI 36.03 kg/m2 35.35 kg/m2 34.86 kg/m2    Physical Exam Constitutional:      General: He is not in acute distress.    Appearance: Normal appearance.  HENT:     Head: Normocephalic and atraumatic.  Eyes:     Comments: Endorsing dryness and irritation underneath the eyelids. There are some mild dryness and flaking to the upper eyelids.  Otherwise exam is unremarkable.  Pulmonary:     Effort: Pulmonary effort is normal. No respiratory distress.  Neurological:     Mental Status: He is alert.  Psychiatric:        Mood and Affect: Mood normal.        Behavior: Behavior normal.     Lab Results  Component Value Date   WBC 8.9 01/30/2024   HGB 16.6 01/30/2024   HCT 50.7 01/30/2024   PLT 219 01/30/2024   GLUCOSE 76 05/10/2024   CHOL 162 01/30/2024   TRIG 60 01/30/2024   HDL 65 01/30/2024   LDLCALC 85 01/30/2024   ALT 13 01/30/2024   AST 19 01/30/2024   NA 137 05/10/2024   K 4.3 05/10/2024   CL 99 05/10/2024   CREATININE 0.83 05/10/2024   BUN 13 05/10/2024   CO2 26 05/10/2024   TSH 0.971 01/30/2024   PSA 0.4 07/22/2019   INR 1.15 09/16/2015   HGBA1C 5.6 01/30/2024     Assessment & Plan:  Rash Assessment &  Plan: Topical desonide as prescribed.   Other orders -     Desonide; Apply topically daily as needed.  Dispense: 30 g; Refill: 0    Follow-up:  Return if symptoms worsen or fail to improve.  Jacqulyn Ahle DO [facility]

## 2024-09-01 NOTE — Assessment & Plan Note (Signed)
Topical desonide as prescribed.

## 2024-09-02 ENCOUNTER — Encounter (HOSPITAL_COMMUNITY)
Admission: RE | Admit: 2024-09-02 | Discharge: 2024-09-02 | Disposition: A | Discharge status: Home / Self Care | Source: Ambulatory Visit | Attending: Pulmonary Disease | Admitting: Pulmonary Disease

## 2024-09-02 DIAGNOSIS — J439 Emphysema, unspecified: Secondary | ICD-10-CM

## 2024-09-02 NOTE — Progress Notes (Signed)
 Daily Session Note  Patient Details  Name: Ricardo Burch MRN: [id number] Date of Birth: 1961-09-19 Referring Provider:   Flowsheet Row PULMONARY REHAB OTHER RESP ORIENTATION from 05/17/2024 in [facility] CARDIAC REHABILITATION  Referring Provider Theophilus Roosevelt MD    Encounter Date: 09/02/2024  Check In:  Session Check In - 09/02/24 1038       Check-In   Supervising physician immediately available to respond to emergencies See telemetry face sheet for immediately available MD    Location [facility]    Staff Present Powell Benders, BS, Exercise Physiologist;Samiyah Stupka Vonzell, MA, RCEP, CCRP, CCET;Hillary Troutman BSN, RN    Virtual Visit No    Medication changes reported     No    Fall or balance concerns reported    No    Warm-up and Cool-down Performed as group-led Writer Performed Yes    VAD Patient? No    PAD/SET Patient? No      Pain Assessment   Currently in Pain? No/denies          Capillary Blood Glucose: No results found for this or any previous visit (from the past 24 hours).    Social History   Tobacco Use  Smoking Status Former   Current packs/day: 0.00   Average packs/day: 1 pack/day for 38.0 years (38.0 ttl pk-yrs)   Types: Cigarettes   Start date: 02/20/1982   Quit date: 02/21/2020   Years since quitting: 4.5  Smokeless Tobacco Never    Goals Met:  Proper associated with RPD/PD & O2 Sat Independence with exercise equipment Using PLB without cueing & demonstrates good technique Exercise tolerated well No report of concerns or symptoms today Strength training completed today  Goals Unmet:  Not Applicable  Comments: Pt able to follow exercise prescription today without complaint.  Will continue to monitor for progression.

## 2024-09-05 DIAGNOSIS — J449 Chronic obstructive pulmonary disease, unspecified: Secondary | ICD-10-CM | POA: Diagnosis not present

## 2024-09-07 ENCOUNTER — Encounter (HOSPITAL_COMMUNITY)

## 2024-09-08 ENCOUNTER — Encounter (HOSPITAL_COMMUNITY): Payer: Self-pay

## 2024-09-08 DIAGNOSIS — J4489 Other specified chronic obstructive pulmonary disease: Secondary | ICD-10-CM

## 2024-09-08 NOTE — Progress Notes (Signed)
 Pulmonary Individual Treatment Plan  Patient Details  Name: Ricardo Burch MRN: [id number] Date of Birth: 20-Dec-1960 Referring Provider:   Flowsheet Row PULMONARY REHAB OTHER RESP ORIENTATION from 05/17/2024 in [facility]  Referring Provider Theophilus Roosevelt MD    Initial Encounter Date:  Flowsheet Row PULMONARY REHAB OTHER RESP ORIENTATION from 05/17/2024 in [facility]  Date 05/17/24    Visit Diagnosis: COPD with chronic bronchitis and emphysema (HCC)  Patient's Home Medications on Admission:  Current Outpatient Medications:    albuterol  (VENTOLIN  HFA) 108 (90 Base) MCG/ACT inhaler, INHALE 1-2 PUFFS BY MOUTH EVERY 6 HOURS AS NEEDED FOR WHEEZING AND SHORTNESS OF BREATH, Disp: 8.5 g, Rfl: 11   amLODipine  (NORVASC ) 5 MG tablet, TAKE 1 TABLET BY MOUTH DAILY, Disp: 30 tablet, Rfl: 11   ciprofloxacin -dexamethasone  (CIPRODEX ) OTIC suspension, Place 4 drops into the left ear 2 (two) times daily., Disp: 7.5 mL, Rfl: 0   desonide (DESOWEN) 0.05 % cream, Apply topically daily as needed., Disp: 30 g, Rfl: 0   fluticasone  (FLONASE ) 50 MCG/ACT nasal spray, INSTILL TWO (2) SPRAYS IN EACH NOSTRIL TWICE DAILY, Disp: 16 g, Rfl: 11   Fluticasone -Umeclidin-Vilant (TRELEGY ELLIPTA ) 200-62.5-25 MCG/ACT AEPB, INHALE 1 PUFF BY MOUTH DAILY, Disp: 60 each, Rfl: 9   furosemide  (LASIX ) 20 MG tablet, Take 20 mg by mouth daily., Disp: , Rfl:    levocetirizine (XYZAL ) 5 MG tablet, Take 1 tablet (5 mg total) by mouth every evening., Disp: 30 tablet, Rfl: 5   Olopatadine  HCl 0.2 % SOLN, Apply 1 drop to eye daily., Disp: 2.5 mL, Rfl: 0   OXYGEN , Inhale 3 L into the lungs daily. continuous, Disp: , Rfl:    OZEMPIC, 1 MG/DOSE, 4 MG/3ML SOPN, SMARTSIG:0.75 Milliliter(s) SUB-Q Once a Week, Disp: , Rfl:    roflumilast  (DALIRESP ) 500 MCG TABS tablet, TAKE 1 TABLET ( ) BY MOUTH DAILY, Disp: 30 tablet, Rfl: 11   sildenafil  (VIAGRA ) 50 MG tablet, Take 1 tablet (50 mg total) by mouth  daily as needed for erectile dysfunction., Disp: 30 tablet, Rfl: 1   spironolactone (ALDACTONE) 25 MG tablet, Take 25 mg by mouth daily., Disp: , Rfl:   Past Medical History: Past Medical History:  Diagnosis Date   Allergy    CHF (congestive heart failure) (HCC)    HFpEF   Chronic kidney disease    COPD (chronic obstructive pulmonary disease) (HCC) Dx 2015   Depression    Eczema    since childhood    Emphysema of lung (HCC)    Erectile dysfunction 08/18/2019   GERD (gastroesophageal reflux disease) 01/25/2020   Hyperlipidemia 01/12/2018   Hypertension    Oxygen  deficiency    Screening for HIV (human immunodeficiency virus)    Sleep apnea    CPAP   Substance abuse (HCC)    MJ, cocaine   Testicular failure 07/22/2019    Tobacco Use: Social History   Tobacco Use  Smoking Status Former   Current packs/day: 0.00   Average packs/day: 1 pack/day for 38.0 years (38.0 ttl pk-yrs)   Types: Cigarettes   Start date: 02/20/1982   Quit date: 02/21/2020   Years since quitting: 4.5  Smokeless Tobacco Never    Labs: Review Flowsheet  More data exists      Latest Ref Rng & Units 10/25/2020 05/16/2022 11/20/2022 05/19/2023 01/30/2024  Labs for ITP Cardiac and Pulmonary Rehab  Cholestrol 100 - 199 mg/dL 842  843  839  849  837   LDL (calc) 0 - 99  mg/dL 86  94  99  87  85   HDL-C >39 mg/dL 56  51  47  52  65   Trlycerides 0 - 149 mg/dL 64  52  70  49  60   Hemoglobin A1c 4.8 - 5.6 % 5.8  5.7  5.6  5.7  5.6      Pulmonary Assessment Scores:  Pulmonary Assessment Scores     Row Name 05/11/24 1319         ADL UCSD   ADL Phase Entry     SOB Score total 39     Rest 0     Walk 3     Stairs 3     Bath 2     Dress 2     Shop 2       CAT Score   CAT Score 14        UCSD: Self-administered rating of dyspnea associated with activities of daily living (ADLs) 6-point scale (0 = not at all to 5 = maximal or unable to do because of breathlessness)  Scoring Scores range from  0 to 120.  Minimally important difference is 5 units  CAT: CAT can identify the health impairment of COPD patients and is better correlated with disease progression.  CAT has a scoring range of zero to 40. The CAT score is classified into four groups of low (less than 10), medium (10 - 20), high (21-30) and very high (31-40) based on the impact level of disease on health status. A CAT score over 10 suggests significant symptoms.  A worsening CAT score could be explained by an exacerbation, poor medication adherence, poor inhaler technique, or progression of COPD or comorbid conditions.  CAT MCID is 2 points  mMRC: mMRC (Modified Medical Research Council) Dyspnea Scale is used to assess the degree of baseline functional disability in patients of respiratory disease due to dyspnea. No minimal important difference is established. A decrease in score of 1 point or greater is considered a positive change.   Pulmonary Function Assessment:  Pulmonary Function Assessment - 05/11/24 1311       Pulmonary Function Tests   FVC% 56 %    FEV1% 37 %    FEV1/FVC Ratio 61    DLCO% 50 %          Exercise Target Goals: Exercise Program Goal: Individual exercise prescription set using results from initial 6 min walk test and THRR while considering  patient's activity barriers and safety.   Exercise Prescription Goal: Initial exercise prescription builds to 30-45 minutes a day of aerobic activity, 2-3 days per week.  Home exercise guidelines will be given to patient during program as part of exercise prescription that the participant will acknowledge.  Education: Aerobic Exercise: - Group verbal and visual presentation on the components of exercise prescription. Introduces F.I.T.T principle from ACSM for exercise prescriptions.  Reviews F.I.T.T. principles of aerobic exercise including progression. Written material provided at class time. Flowsheet Row PULMONARY REHAB CHRONIC OBSTRUCTIVE PULMONARY  DISEASE from 09/02/2024 in [facility]  Date 09/02/24  Educator jh  Instruction Review Code 2- Demonstrated Understanding    Education: Resistance Exercise: - Group verbal and visual presentation on the components of exercise prescription. Introduces F.I.T.T principle from ACSM for exercise prescriptions  Reviews F.I.T.T. principles of resistance exercise including progression. Written material provided at class time.    Education: Exercise & Equipment Safety: - Individual verbal instruction and demonstration of equipment use and safety with  use of the equipment.   Education: Exercise Physiology & General Exercise Guidelines: - Group verbal and written instruction with models to review the exercise physiology of the cardiovascular system and associated critical values. Provides general exercise guidelines with specific guidelines to those with heart or lung disease.  Flowsheet Row PULMONARY REHAB CHRONIC OBSTRUCTIVE PULMONARY DISEASE from 09/02/2024 in [facility]  Date 06/17/24  Educator hb  Instruction Review Code 2- Demonstrated Understanding    Education: Flexibility, Balance, Mind/Body Relaxation: - Group verbal and visual presentation with interactive activity on the components of exercise prescription. Introduces F.I.T.T principle from ACSM for exercise prescriptions. Reviews F.I.T.T. principles of flexibility and balance exercise training including progression. Also discusses the mind body connection.  Reviews various relaxation techniques to help reduce and manage stress (i.e. Deep breathing, progressive muscle relaxation, and visualization). Balance handout provided to take home. Written material provided at class time.   Activity Barriers & Risk Stratification:  Activity Barriers & Cardiac Risk Stratification - 05/11/24 1304       Activity Barriers & Cardiac Risk Stratification   Activity Barriers Muscular  Weakness;Deconditioning;Shortness of Breath          6 Minute Walk:  6 Minute Walk     Row Name 05/17/24 1052         6 Minute Walk   Phase Initial     Distance 570 feet     Walk Time 3.46 minutes     # of Rest Breaks 0     MPH 1.87     METS 1.8     RPE 13     Perceived Dyspnea  3     VO2 Peak 6.3     Symptoms Yes (comment)     Comments pt o2 dropped 74 at 3.46 min test stopped     Resting HR 74 bpm     Resting BP 112/78     Resting Oxygen  Saturation  90 %     Exercise Oxygen  Saturation  during 6 min walk 74 %     Max Ex. HR 93 bpm     Max Ex. BP 150/70     2 Minute Post BP 126/70       Interval HR   1 Minute HR 91     2 Minute HR 87     3 Minute HR 93     6 Minute HR 74     2 Minute Post HR 71     Interval Heart Rate? Yes       Interval Oxygen    Interval Oxygen ? Yes     Baseline Oxygen  Saturation % 90 %     1 Minute Oxygen  Saturation % 90 %     1 Minute Liters of Oxygen  0 L     2 Minute Oxygen  Saturation % 86 %     2 Minute Liters of Oxygen  0 L     3 Minute Oxygen  Saturation % 74 %     3 Minute Liters of Oxygen  0 L     6 Minute Oxygen  Saturation % 87 %     6 Minute Liters of Oxygen  0 L     2 Minute Post Oxygen  Saturation % 88 %     2 Minute Post Liters of Oxygen  0 L       Oxygen  Initial Assessment:  Oxygen  Initial Assessment - 05/11/24 1310       Home Oxygen    Home Oxygen  Device Portable Concentrator;Home Concentrator  Sleep Oxygen  Prescription CPAP    Liters per minute 3    Home Exercise Oxygen  Prescription Continuous    Liters per minute 3    Home Resting Oxygen  Prescription Continuous    Liters per minute 3    Compliance with Home Oxygen  Use Yes      Intervention   Short Term Goals To learn and exhibit compliance with exercise, home and travel O2 prescription;To learn and understand importance of monitoring SPO2 with pulse oximeter and demonstrate accurate use of the pulse oximeter.;To learn and understand importance of maintaining oxygen   saturations>88%;To learn and demonstrate proper pursed lip breathing techniques or other breathing techniques. ;To learn and demonstrate proper use of respiratory medications    Long  Term Goals Exhibits compliance with exercise, home  and travel O2 prescription;Verbalizes importance of monitoring SPO2 with pulse oximeter and return demonstration;Maintenance of O2 saturations>88%;Exhibits proper breathing techniques, such as pursed lip breathing or other method taught during program session;Compliance with respiratory medication;Demonstrates proper use of MDI's          Oxygen  Re-Evaluation:  Oxygen  Re-Evaluation     Row Name 06/08/24 1038 07/01/24 1200 08/10/24 1100 08/24/24 1045       Program Oxygen  Prescription   Program Oxygen  Prescription -- Continuous Continuous Continuous    Liters per minute -- 3 3 3       Home Oxygen    Home Oxygen  Device -- Portable Concentrator;Home Concentrator;E-Tanks Portable Concentrator;Home Concentrator;E-Tanks Portable Concentrator;Home Concentrator;E-Tanks    Sleep Oxygen  Prescription -- Continuous;CPAP Continuous;CPAP Continuous;CPAP    Liters per minute -- 3 3 3     Home Exercise Oxygen  Prescription -- Continuous Continuous Continuous    Liters per minute -- 3 3 --    Home Resting Oxygen  Prescription -- Continuous Continuous --    Liters per minute -- 3 3 3     Compliance with Home Oxygen  Use -- -- Yes Yes      Goals/Expected Outcomes   Short Term Goals -- To learn and exhibit compliance with exercise, home and travel O2 prescription;To learn and understand importance of monitoring SPO2 with pulse oximeter and demonstrate accurate use of the pulse oximeter.;To learn and understand importance of maintaining oxygen  saturations>88%;To learn and demonstrate proper pursed lip breathing techniques or other breathing techniques. ;To learn and demonstrate proper use of respiratory medications To learn and exhibit compliance with exercise, home and travel O2  prescription;To learn and understand importance of monitoring SPO2 with pulse oximeter and demonstrate accurate use of the pulse oximeter.;To learn and understand importance of maintaining oxygen  saturations>88%;To learn and demonstrate proper pursed lip breathing techniques or other breathing techniques. ;To learn and demonstrate proper use of respiratory medications To learn and exhibit compliance with exercise, home and travel O2 prescription;To learn and understand importance of monitoring SPO2 with pulse oximeter and demonstrate accurate use of the pulse oximeter.;To learn and understand importance of maintaining oxygen  saturations>88%;To learn and demonstrate proper pursed lip breathing techniques or other breathing techniques. ;To learn and demonstrate proper use of respiratory medications    Long  Term Goals -- Exhibits compliance with exercise, home  and travel O2 prescription;Verbalizes importance of monitoring SPO2 with pulse oximeter and return demonstration;Exhibits proper breathing techniques, such as pursed lip breathing or other method taught during program session;Maintenance of O2 saturations>88%;Compliance with respiratory medication;Demonstrates proper use of MDI's Exhibits compliance with exercise, home  and travel O2 prescription;Verbalizes importance of monitoring SPO2 with pulse oximeter and return demonstration;Exhibits proper breathing techniques, such as pursed lip breathing or other method  taught during program session;Maintenance of O2 saturations>88%;Compliance with respiratory medication;Demonstrates proper use of MDI's Exhibits compliance with exercise, home  and travel O2 prescription;Verbalizes importance of monitoring SPO2 with pulse oximeter and return demonstration;Exhibits proper breathing techniques, such as pursed lip breathing or other method taught during program session;Maintenance of O2 saturations>88%;Compliance with respiratory medication;Demonstrates proper use of  MDI's    Comments Reviewed PLB technique with pt.  Talked about how it works and it's importance in maintaining their exercise saturations. Quintus continues to keep a check on his oxygen  saturations at home. He wears his cpap at bedtime. He notes he breathing is good here and at home, noting when he feels exerted he stops and does his PLB techniques. Delmus is doing well in rehab. He just came back from a two week break from his breathing being rough with allergies. He states it is a lot easier to breathe in the cooler months. When he feels SOB he uses his PLB techniques. Gibril is doing well in rehab. He feels his breathing is ok, it hasn't gotten any worse. The cooler months make is easier to breathe. He is compliant with his oxygen  and CPAP. He is going to ask his doctor about doing another sleep study to see if he can get off his CPAP at night.    Goals/Expected Outcomes Short: Become more profiecient at using PLB.   Long: Become independent at using PLB. Short: continue to attand rehab. Long: continue using PLB techniques when needed. Short: continue to attand rehab. Long: continue using PLB techniques when needed. Short: continue to attand rehab. Long: Speak with doctor about another sleep study.       Oxygen  Discharge (Final Oxygen  Re-Evaluation):  Oxygen  Re-Evaluation - 08/24/24 1045       Program Oxygen  Prescription   Program Oxygen  Prescription Continuous    Liters per minute 3      Home Oxygen    Home Oxygen  Device Portable Concentrator;Home Concentrator;E-Tanks    Sleep Oxygen  Prescription Continuous;CPAP    Liters per minute 3    Home Exercise Oxygen  Prescription Continuous    Liters per minute 3    Compliance with Home Oxygen  Use Yes      Goals/Expected Outcomes   Short Term Goals To learn and exhibit compliance with exercise, home and travel O2 prescription;To learn and understand importance of monitoring SPO2 with pulse oximeter and demonstrate accurate use of the pulse oximeter.;To  learn and understand importance of maintaining oxygen  saturations>88%;To learn and demonstrate proper pursed lip breathing techniques or other breathing techniques. ;To learn and demonstrate proper use of respiratory medications    Long  Term Goals Exhibits compliance with exercise, home  and travel O2 prescription;Verbalizes importance of monitoring SPO2 with pulse oximeter and return demonstration;Exhibits proper breathing techniques, such as pursed lip breathing or other method taught during program session;Maintenance of O2 saturations>88%;Compliance with respiratory medication;Demonstrates proper use of MDI's    Comments Hartley is doing well in rehab. He feels his breathing is ok, it hasn't gotten any worse. The cooler months make is easier to breathe. He is compliant with his oxygen  and CPAP. He is going to ask his doctor about doing another sleep study to see if he can get off his CPAP at night.    Goals/Expected Outcomes Short: continue to attand rehab. Long: Speak with doctor about another sleep study.          Initial Exercise Prescription:  Initial Exercise Prescription - 05/17/24 1000       Date  of Initial Exercise RX and Referring Provider   Date 05/17/24    Referring Provider Mannam, Praveen MD      Oxygen    Oxygen  Continuous    Liters 3    Maintain Oxygen  Saturation 88% or higher      Treadmill   MPH 1.2    Grade 0    Minutes 15    METs 1.92      REL-XR   Level 1    Speed 50    Minutes 15    METs 1.9      Prescription Details   Frequency (times per week) 2    Duration Progress to 30 minutes of continuous aerobic without signs/symptoms of physical distress      Intensity   THRR 40-80% of Max Heartrate 108-143    Ratings of Perceived Exertion 11-13    Perceived Dyspnea 0-4      Resistance Training   Training Prescription Yes    Weight 7    Reps 10-15          Perform Capillary Blood Glucose checks as needed.  Exercise Prescription Changes:   Exercise  Prescription Changes     Row Name 05/17/24 1100 06/29/24 1500 07/29/24 1500 08/17/24 1500       Response to Exercise   Blood Pressure (Admit) 112/78 105/70 122/62 122/70    Blood Pressure (Exercise) 150/70 134/76 -- --    Blood Pressure (Exit) 126/70 118/60 112/70 100/68    Heart Rate (Admit) 74 bpm 78 bpm 63 bpm 79 bpm    Heart Rate (Exercise) 93 bpm 100 bpm 84 bpm 92 bpm    Heart Rate (Exit) 71 bpm 77 bpm 73 bpm 85 bpm    Oxygen  Saturation (Admit) 90 % 91 % 90 % 90 %    Oxygen  Saturation (Exercise) 74 % 87 % 87 % 89 %    Oxygen  Saturation (Exit) 88 % 94 % 91 % 91 %    Rating of Perceived Exertion (Exercise) 13 12 11 12     Perceived Dyspnea (Exercise) 3 2 1 1     Duration Progress to 30 minutes of  aerobic without signs/symptoms of physical distress Continue with 30 min of aerobic exercise without signs/symptoms of physical distress. Continue with 30 min of aerobic exercise without signs/symptoms of physical distress. Continue with 30 min of aerobic exercise without signs/symptoms of physical distress.    Intensity THRR unchanged THRR unchanged THRR unchanged THRR unchanged      Progression   Progression -- Continue to progress workloads to maintain intensity without signs/symptoms of physical distress. Continue to progress workloads to maintain intensity without signs/symptoms of physical distress. Continue to progress workloads to maintain intensity without signs/symptoms of physical distress.      Resistance Training   Training Prescription -- Yes Yes Yes    Weight -- 7 7 7     Reps -- 10-15 10-15 10-15      Oxygen    Oxygen  -- Continuous Continuous Continuous    Liters -- 4 4 3       Treadmill   MPH -- 1.7 1 1.4    Grade -- 0 0 0    Minutes -- 15 15 15     METs -- 2.3 1.77 2.07      NuStep   Level -- -- -- 4    SPM -- -- -- 95    Minutes -- -- -- 15    METs -- -- -- 2.7      REL-XR  Level -- 1 3 --    Speed -- 46 94 --    Minutes -- 15 15 --    METs -- 2.1 2.6 --       Oxygen    Maintain Oxygen  Saturation -- 88% or higher 88% or higher 88% or higher       Exercise Comments:   Exercise Comments     Row Name 05/11/24 1310 05/17/24 1029 06/08/24 1037       Exercise Comments Soren currently doesn't do any home exercise besides ADL's and walking his dog. Patient attend orientation today.  Patient is attending Pulmonary Rehabilitation Program.  Documentation for diagnosis can be found in [facility].  Reviewed medical chart, RPE/RPD, gym safety, and program guidelines.  Patient was fitted to equipment they will be using during rehab.  Patient is scheduled to start exercise on 06/08/24.   Initial ITP created and sent for review and signature by Dr. Anton Kelp, Medical Director for Pulmonary Rehabilitation Program. First full day of exercise!  Patient was oriented to gym and equipment including functions, settings, policies, and procedures.  Patient's individual exercise prescription and treatment plan were reviewed.  All starting workloads were established based on the results of the 6 minute walk test done at initial orientation visit.  The plan for exercise progression was also introduced and progression will be customized based on patient's performance and goals.        Exercise Goals and Review:   Exercise Goals     Row Name 05/11/24 1309             Exercise Goals   Increase Physical Activity Yes       Intervention Provide advice, education, support and counseling about physical activity/exercise needs.;Develop an individualized exercise prescription for aerobic and resistive training based on initial evaluation findings, risk stratification, comorbidities and participant's personal goals.       Expected Outcomes Short Term: Attend rehab on a regular basis to increase amount of physical activity.;Long Term: Add in home exercise to make exercise part of routine and to increase amount of physical activity.;Long Term: Exercising regularly at least 3-5 days a  week.       Increase Strength and Stamina Yes       Intervention Provide advice, education, support and counseling about physical activity/exercise needs.;Develop an individualized exercise prescription for aerobic and resistive training based on initial evaluation findings, risk stratification, comorbidities and participant's personal goals.       Expected Outcomes Short Term: Increase workloads from initial exercise prescription for resistance, speed, and METs.;Short Term: Perform resistance training exercises routinely during rehab and add in resistance training at home;Long Term: Improve cardiorespiratory fitness, muscular endurance and strength as measured by increased METs and functional capacity ( )       Able to understand and use rate of perceived exertion (RPE) scale Yes       Intervention Provide education and explanation on how to use RPE scale       Expected Outcomes Short Term: Able to use RPE daily in rehab to express subjective intensity level;Long Term:  Able to use RPE to guide intensity level when exercising independently       Able to understand and use Dyspnea scale Yes       Intervention Provide education and explanation on how to use Dyspnea scale       Expected Outcomes Short Term: Able to use Dyspnea scale daily in rehab to express subjective sense of shortness of breath during exertion;Long  Term: Able to use Dyspnea scale to guide intensity level when exercising independently       Knowledge and understanding of Target Heart Rate Range (THRR) Yes       Intervention Provide education and explanation of THRR including how the numbers were predicted and where they are located for reference       Expected Outcomes Short Term: Able to state/look up THRR;Short Term: Able to use daily as guideline for intensity in rehab;Long Term: Able to use THRR to govern intensity when exercising independently       Able to check pulse independently Yes       Intervention Provide education and  demonstration on how to check pulse in carotid and radial arteries.;Review the importance of being able to check your own pulse for safety during independent exercise       Expected Outcomes Short Term: Able to explain why pulse checking is important during independent exercise;Long Term: Able to check pulse independently and accurately       Understanding of Exercise Prescription Yes       Intervention Provide education, explanation, and written materials on patient's individual exercise prescription       Expected Outcomes Short Term: Able to explain program exercise prescription;Long Term: Able to explain home exercise prescription to exercise independently          Exercise Goals Re-Evaluation :  Exercise Goals Re-Evaluation     Row Name 06/08/24 1037 07/01/24 0941 07/01/24 1155 08/03/24 0838 08/10/24 1101     Exercise Goal Re-Evaluation   Exercise Goals Review Able to understand and use rate of perceived exertion (RPE) scale;Knowledge and understanding of Target Heart Rate Range (THRR) Increase Physical Activity;Increase Strength and Stamina;Understanding of Exercise Prescription Increase Physical Activity;Increase Strength and Stamina;Able to understand and use Dyspnea scale;Understanding of Exercise Prescription;Knowledge and understanding of Target Heart Rate Range (THRR);Able to understand and use rate of perceived exertion (RPE) scale Increase Physical Activity;Increase Strength and Stamina;Understanding of Exercise Prescription Increase Physical Activity;Increase Strength and Stamina;Able to understand and use Dyspnea scale;Knowledge and understanding of Target Heart Rate Range (THRR);Able to understand and use rate of perceived exertion (RPE) scale;Understanding of Exercise Prescription   Comments Reviewed RPE and dyspnea scale, THR and program prescription with pt today.  Pt voiced understanding and was given a copy of goals to take home. Yusuke has completed 7 sessions of rehab. He has  been doing well with the program . He has had to increase his oxygen  to 4L during exercise due to excerting hisself. Will continue to montior and progress as able Aul is doing great in rehab. He notes he doesn't really exercise at home but he does do lots of things around the house. Spoke with him about maybe starting to walk his street, even with his dog, also did tell him to make sure the weather was ok when he did so his breathing would be ok. Cadin has completed 13 sessions of PR. He has be increasing his level on the nustep and has high spm. Is is still walking at a speed of 1.0 MPH on the treadmill but does not need to take as many breaks. Will continue to moniotr and progress as able. Viraaj is doing well in rehab. He is doing some walking at home. He is increasing his levels on the machines here at rehab when his oxygen  allows.   Expected Outcomes Short: Use RPE daily to regulate intensity.  Long: Follow program prescription in THR. Short: contiiue to increase  levels and speed on treadmill   long: continue to attend rehab Short: continue to attend rehab. Long: walk more at home. Short: continue to walk the treadmill  long: continue to atted rehab and exercise Short: Continue to attend rehab. Long: Increase exercise at home.    Row Name 08/18/24 1307 08/24/24 1044           Exercise Goal Re-Evaluation   Exercise Goals Review Increase Physical Activity;Increase Strength and Stamina;Understanding of Exercise Prescription Increase Physical Activity;Increase Strength and Stamina;Able to understand and use rate of perceived exertion (RPE) scale      Comments Kaevion has completed 16 sessions of PR. HE is walking on the treadmiill in class and has had good oxygen  levels WNL. Will continue to monitor and progress as abe Radin is doing well in rehab. He is trying to increase his levels on the treadmill and Nustep, yet he has to take breaks because his breathing will get tough and O2 sat's will drop. He does some  walking at home, yet states he could incorpoate more.      Expected Outcomes Short: continue to increase levels on the treadmill   long: conitnue to attend rehab Short: Continue to attend rehab. Long: Incorporate more exercise at home.         Discharge Exercise Prescription (Final Exercise Prescription Changes):  Exercise Prescription Changes - 08/17/24 1500       Response to Exercise   Blood Pressure (Admit) 122/70    Blood Pressure (Exit) 100/68    Heart Rate (Admit) 79 bpm    Heart Rate (Exercise) 92 bpm    Heart Rate (Exit) 85 bpm    Oxygen  Saturation (Admit) 90 %    Oxygen  Saturation (Exercise) 89 %    Oxygen  Saturation (Exit) 91 %    Rating of Perceived Exertion (Exercise) 12    Perceived Dyspnea (Exercise) 1    Duration Continue with 30 min of aerobic exercise without signs/symptoms of physical distress.    Intensity THRR unchanged      Progression   Progression Continue to progress workloads to maintain intensity without signs/symptoms of physical distress.      Resistance Training   Training Prescription Yes    Weight 7    Reps 10-15      Oxygen    Oxygen  Continuous    Liters 3      Treadmill   MPH 1.4    Grade 0    Minutes 15    METs 2.07      NuStep   Level 4    SPM 95    Minutes 15    METs 2.7      Oxygen    Maintain Oxygen  Saturation 88% or higher          Nutrition:  Target Goals: Understanding of nutrition guidelines, daily intake of sodium 1500mg , cholesterol 200mg , calories 30% from fat and 7% or less from saturated fats, daily to have 5 or more servings of fruits and vegetables.  Education: Nutrition 1 -Group instruction provided by verbal, written material, interactive activities, discussions, models, and posters to present general guidelines for heart healthy nutrition including macronutrients, label reading, and promoting whole foods over processed counterparts. Education serves as pensions consultant of discussion of heart healthy eating for  all. Written material provided at class time.     Education: Nutrition 2 -Group instruction provided by verbal, written material, interactive activities, discussions, models, and posters to present general guidelines for heart healthy nutrition including sodium, cholesterol, and  saturated fat. Providing guidance of habit forming to improve blood pressure, cholesterol, and body weight. Written material provided at class time.     Biometrics:  Pre Biometrics - 05/17/24 1102       Pre Biometrics   Height 5' 9 (1.753 m)    Weight 238 lb 1.6 oz (108 kg)    Waist Circumference 47 inches    Hip Circumference 42 inches    Waist to Hip Ratio 1.12 %    BMI (Calculated) 35.14    Grip Strength 44.2 kg           Nutrition Therapy Plan and Nutrition Goals:  Nutrition Therapy & Goals - 05/11/24 1313       Intervention Plan   Intervention Prescribe, educate and counsel regarding individualized specific dietary modifications aiming towards targeted core components such as weight, hypertension, lipid management, diabetes, heart failure and other comorbidities.;Nutrition handout(s) given to patient.    Expected Outcomes Short Term Goal: Understand basic principles of dietary content, such as calories, fat, sodium, cholesterol and nutrients.;Short Term Goal: A plan has been developed with personal nutrition goals set during dietitian appointment.;Long Term Goal: Adherence to prescribed nutrition plan.          Nutrition Assessments:  MEDIFICTS Score Key: >=70 Need to make dietary changes  40-70 Heart Healthy Diet <= 40 Therapeutic Level Cholesterol Diet  Flowsheet Row PULMONARY VIRTUAL BASED CARE from 05/11/2024 in [facility]  Picture Your Plate Total Score on Admission 49   Picture Your Plate Scores: <59 Unhealthy dietary pattern with much room for improvement. 41-50 Dietary pattern unlikely to meet recommendations for good health and room for  improvement. 51-60 More healthful dietary pattern, with some room for improvement.  >60 Healthy dietary pattern, although there may be some specific behaviors that could be improved.   Nutrition Goals Re-Evaluation:  Nutrition Goals Re-Evaluation     Row Name 07/01/24 1145 08/10/24 1057 08/24/24 1041         Goals   Nutrition Goal Short: start to incorporate more fiber in his diet. Long: eat 3 well-balanced meals per day. Healthy eating Healthy eating     Comment Bauer notes he is not eating the best at home, he does not eat much and not very well-balanced meals, he does like to eat fish and chicken and salads. I talked with Elza about continuing fish and chicken for protein, and maybe switching his salads with iceberg lettuce to more dark-green leafy. Camp also complains of constipation, so I spoke with him about his fiber intake, I mentioned metamucial supplement and foods that are high in fiber. Antuan notes he is not eating the best at home, he does not eat much and not very well-balanced meals, he does like to eat fish and chicken and salads. I talked with Adarrius about continuing fish and chicken for protein, and maybe switching his salads with iceberg lettuce to more dark-green leafy. Dhiren also complains of constipation, so I spoke with him about his fiber intake, I mentioned metamucial supplement and foods that are high in fiber. Dickson states that he is still struggling with his diet a little bit. He mainly eats 2 meals a day and something Engram for lunch. He does state that he cheats a little on the weekends. Encouraged him to limit cheat days, yet still have them in moderation. He feels he is drinking enough water throughout the day.     Expected Outcome Short: start to incorporate more fiber in his  diet. Long: eat 3 well-balanced meals per day. Short: Continue to attend rehab. Long: Incorporate more fiber into his diet. Short: Continue to attend rehab. Long: Incorporate more healthy options  into his diet.        Nutrition Goals Discharge (Final Nutrition Goals Re-Evaluation):  Nutrition Goals Re-Evaluation - 08/24/24 1041       Goals   Nutrition Goal Healthy eating    Comment Matej states that he is still struggling with his diet a little bit. He mainly eats 2 meals a day and something Natal for lunch. He does state that he cheats a little on the weekends. Encouraged him to limit cheat days, yet still have them in moderation. He feels he is drinking enough water throughout the day.    Expected Outcome Short: Continue to attend rehab. Long: Incorporate more healthy options into his diet.          Psychosocial: Target Goals: Acknowledge presence or absence of significant depression and/or stress, maximize coping skills, provide positive support system. Participant is able to verbalize types and ability to use techniques and skills needed for reducing stress and depression.   Education: Stress, Anxiety, and Depression - Group verbal and visual presentation to define topics covered.  Reviews how body is impacted by stress, anxiety, and depression.  Also discusses healthy ways to reduce stress and to treat/manage anxiety and depression.  Written material provided at class time.   Education: Sleep Hygiene -Provides group verbal and written instruction about how sleep can affect your health.  Define sleep hygiene, discuss sleep cycles and impact of sleep habits. Review good sleep hygiene tips.    Initial Review & Psychosocial Screening:  Initial Psych Review & Screening - 05/11/24 1313       Initial Review   Current issues with None Identified      Family Dynamics   Good Support System? Yes    Comments Raford didn't specify who his support system was, but he states he has people to help him if needed.      Barriers   Psychosocial barriers to participate in program There are no identifiable barriers or psychosocial needs.      Screening Interventions   Interventions  Encouraged to exercise    Expected Outcomes Short Term goal: Identification and review with participant of any Quality of Life or Depression concerns found by scoring the questionnaire.;Long Term goal: The participant improves quality of Life and PHQ9 Scores as seen by post scores and/or verbalization of changes;Long Term Goal: Stressors or current issues are controlled or eliminated.;Short Term goal: Utilizing psychosocial counselor, staff and physician to assist with identification of specific Stressors or current issues interfering with healing process. Setting desired goal for each stressor or current issue identified.          Quality of Life Scores:  Scores of 19 and below usually indicate a poorer quality of life in these areas.  A difference of  2-3 points is a clinically meaningful difference.  A difference of 2-3 points in the total score of the Quality of Life Index has been associated with significant improvement in overall quality of life, self-image, physical symptoms, and general health in studies assessing change in quality of life.  PHQ-9: Review Flowsheet  More data exists      07/23/2024 06/18/2024 05/17/2024 02/11/2024 01/20/2024  Depression screen PHQ 2/9  Decreased Interest 0 0 0 1 0 0  Down, Depressed, Hopeless 0 0 0 1 0 0  PHQ - 2 Score  0 0 0 2 0 0  Altered sleeping 0 0 0 1 0 0  Tired, decreased energy 0 0 1 2 0 0  Change in appetite 0 0 0 0 0 0  Feeling bad or failure about yourself  0 0 0 1 0 0  Trouble concentrating 0 0 0 0 0 0  Moving slowly or fidgety/restless 0 0 1 2 0 0  Suicidal thoughts 0 0 0 0 0 0  PHQ-9 Score 0 0 2 8 0 0  Difficult doing work/chores Not difficult at all Not difficult at all Not difficult at all Somewhat difficult Not difficult at all Not difficult at all    Details       Multiple values from one day are sorted in reverse-chronological order        Interpretation of Total Score  Total Score Depression Severity:  1-4 = Minimal  depression, 5-9 = Mild depression, 10-14 = Moderate depression, 15-19 = Moderately severe depression, 20-27 = Severe depression   Psychosocial Evaluation and Intervention:  Psychosocial Evaluation - 05/11/24 1314       Psychosocial Evaluation & Interventions   Interventions Encouraged to exercise with the program and follow exercise prescription    Comments Nyheim is a pleasant gentleman who is coming into rehab for COPD with chronic bronchitis and emphysema. He has been in the program before but it was for cardiac reasoning.  Dellas states  that he gets around well and is independent with his ADL's. He does not currently do any home exercise besides ADL's and walking his dog outside. He is on 3L oxygen  as needed, and a CPAP at night with 3L. He is having a colonscopy tomorrow and is not very happy about that, but he is eager to start back our program starting Monday the 14th.    Expected Outcomes Short: Increase strength and stamina. Long: Get off oxygen  or be able to wean down eventually.    Continue Psychosocial Services  Follow up required by staff          Psychosocial Re-Evaluation:  Psychosocial Re-Evaluation     Row Name 07/01/24 1141 08/10/24 1056 08/24/24 1040         Psychosocial Re-Evaluation   Current issues with None Identified Current Stress Concerns None Identified     Comments Shante is doing great in rehab. Santosh notes that he is staying busy at home, and going out and doing things with his wife. Karas is enjoying life and coping well. Caidan is doing well in rehab. He is just now coming back from about a 2 week break. His breathing was a little rough from allergies, yet he sais he has had a lot going on as one of his twin daughters is engaged and had an engagement party. His stress is mainly happy stress regarding his daughters engagement. Rondo is doing well in rehab. He currently identifies no stressors or sleep issues at the moment.     Expected Outcomes Short: continue to attend  rehab. Long: continue to stay active in activities that make him happy. Short: continue to attend rehab. Long: Enjoy the engagement of his daughter. Short: Continue to attend rehab. Long: Continue to have healthy stress outlets.     Interventions Encouraged to attend Pulmonary Rehabilitation for the exercise Encouraged to attend Pulmonary Rehabilitation for the exercise;Stress management education Encouraged to attend Pulmonary Rehabilitation for the exercise     Continue Psychosocial Services  Follow up required by staff Follow up required by staff  Follow up required by staff        Psychosocial Discharge (Final Psychosocial Re-Evaluation):  Psychosocial Re-Evaluation - 08/24/24 1040       Psychosocial Re-Evaluation   Current issues with None Identified    Comments Eunice is doing well in rehab. He currently identifies no stressors or sleep issues at the moment.    Expected Outcomes Short: Continue to attend rehab. Long: Continue to have healthy stress outlets.    Interventions Encouraged to attend Pulmonary Rehabilitation for the exercise    Continue Psychosocial Services  Follow up required by staff          Education: Education Goals: Education classes will be provided on a weekly basis, covering required topics. Participant will state understanding/return demonstration of topics presented.  Learning Barriers/Preferences:  Learning Barriers/Preferences - 05/11/24 1313       Learning Barriers/Preferences   Learning Barriers None    Learning Preferences Video;Written Material;Pictoral          General Pulmonary Education Topics:  Infection Prevention: - Provides verbal and written material to individual with discussion of infection control including proper hand washing and proper equipment cleaning during exercise session.   Falls Prevention: - Provides verbal and written material to individual with discussion of falls prevention and safety.   Chronic Lung Disease  Review: - Group verbal instruction with posters, models, PowerPoint presentations and videos,  to review new updates, new respiratory medications, new advancements in procedures and treatments. Providing information on websites and 800 numbers for continued self-education. Includes information about supplement oxygen , available portable oxygen  systems, continuous and intermittent flow rates, oxygen  safety, concentrators, and Medicare reimbursement for oxygen . Explanation of Pulmonary Drugs, including class, frequency, complications, importance of spacers, rinsing mouth after steroid MDI's, and proper cleaning methods for nebulizers. Review of basic lung anatomy and physiology related to function, structure, and complications of lung disease. Review of risk factors. Discussion about methods for diagnosing sleep apnea and types of masks and machines for OSA. Includes a review of the use of types of environmental controls: home humidity, furnaces, filters, dust mite/pet prevention, HEPA vacuums. Discussion about weather changes, air quality and the benefits of nasal washing. Instruction on Warning signs, infection symptoms, calling MD promptly, preventive modes, and value of vaccinations. Review of effective airway clearance, coughing and/or vibration techniques. Emphasizing that all should Create an Action Plan. Written material provided at class time. Flowsheet Row PULMONARY REHAB CHRONIC OBSTRUCTIVE PULMONARY DISEASE from 09/02/2024 in [facility]  Date 08/12/24  Educator dj  Instruction Review Code 2- Demonstrated Understanding    AED/CPR: - Group verbal and written instruction with the use of models to demonstrate the basic use of the AED with the basic ABC's of resuscitation.    Tests and Procedures:  - Group verbal and visual presentation and models provide information about basic cardiac anatomy and function. Reviews the testing methods done to diagnose heart disease and  the outcomes of the test results. Describes the treatment choices: Medical Management, Angioplasty, or Coronary Bypass Surgery for treating various heart conditions including Myocardial Infarction, Angina, Valve Disease, and Cardiac Arrhythmias.  Written material provided at class time. Flowsheet Row PULMONARY REHAB CHRONIC OBSTRUCTIVE PULMONARY DISEASE from 09/02/2024 in [place] [facility]  Date 08/19/24  Educator [facility]  Instruction Review Code 2- Demonstrated Understanding    Medication Safety: - Group verbal and visual instruction to review commonly prescribed medications for heart and lung disease. Reviews the medication, class of the drug, and side effects. Includes  the steps to properly store meds and maintain the prescription regimen.  Written material given at graduation.   Other: -Provides group and verbal instruction on various topics (see comments)   Knowledge Questionnaire Score:  Knowledge Questionnaire Score - 05/11/24 1324       Knowledge Questionnaire Score   Pre Score 15/18           Core Components/Risk Factors/Patient Goals at Admission:  Personal Goals and Risk Factors at Admission - 05/11/24 1312       Core Components/Risk Factors/Patient Goals on Admission   Improve shortness of breath with ADL's Yes    Intervention Provide education, individualized exercise plan and daily activity instruction to help decrease symptoms of SOB with activities of daily living.    Expected Outcomes Short Term: Improve cardiorespiratory fitness to achieve a reduction of symptoms when performing ADLs;Long Term: Be able to perform more ADLs without symptoms or delay the onset of symptoms    Increase knowledge of respiratory medications and ability to use respiratory devices properly  Yes    Intervention Provide education and demonstration as needed of appropriate use of medications, inhalers, and oxygen  therapy.    Expected Outcomes Long Term: Maintain appropriate use  of medications, inhalers, and oxygen  therapy.;Short Term: Achieves understanding of medications use. Understands that oxygen  is a medication prescribed by physician. Demonstrates appropriate use of inhaler and oxygen  therapy.    Heart Failure Yes    Intervention Provide a combined exercise and nutrition program that is supplemented with education, support and counseling about heart failure. Directed toward relieving symptoms such as shortness of breath, decreased exercise tolerance, and extremity edema.    Expected Outcomes Improve functional capacity of life;Short term: Attendance in program 2-3 days a week with increased exercise capacity. Reported lower sodium intake. Reported increased fruit and vegetable intake. Reports medication compliance.;Short term: Daily weights obtained and reported for increase. Utilizing diuretic protocols set by physician.;Long term: Adoption of self-care skills and reduction of barriers for early signs and symptoms recognition and intervention leading to self-care maintenance.    Hypertension Yes    Intervention Provide education on lifestyle modifcations including regular physical activity/exercise, weight management, moderate sodium restriction and increased consumption of fresh fruit, vegetables, and low fat dairy, alcohol moderation, and smoking cessation.;Monitor prescription use compliance.    Expected Outcomes Short Term: Continued assessment and intervention until BP is < 140/72mm HG in hypertensive participants. < 130/53mm HG in hypertensive participants with diabetes, heart failure or chronic kidney disease.;Long Term: Maintenance of blood pressure at goal levels.    Lipids Yes    Intervention Provide education and support for participant on nutrition & aerobic/resistive exercise along with prescribed medications to achieve LDL 70mg , HDL >40mg .    Expected Outcomes Long Term: Cholesterol controlled with medications as prescribed, with individualized exercise RX  and with personalized nutrition plan. Value goals: LDL < 70mg , HDL > 40 mg.;Short Term: Participant states understanding of desired cholesterol values and is compliant with medications prescribed. Participant is following exercise prescription and nutrition guidelines.          Education:Diabetes - Individual verbal and written instruction to review signs/symptoms of diabetes, desired ranges of glucose level fasting, after meals and with exercise. Acknowledge that pre and post exercise glucose checks will be done for 3 sessions at entry of program.   Know Your Numbers and Heart Failure: - Group verbal and visual instruction to discuss disease risk factors for cardiac and pulmonary disease and treatment options.  Reviews associated critical  values for Overweight/Obesity, Hypertension, Cholesterol, and Diabetes.  Discusses basics of heart failure: signs/symptoms and treatments.  Introduces Heart Failure Zone chart for action plan for heart failure. Written material provided at class time. Flowsheet Row PULMONARY REHAB CHRONIC OBSTRUCTIVE PULMONARY DISEASE from 09/02/2024 in [facility]  Instruction Review Code 1- Verbalizes Understanding    Core Components/Risk Factors/Patient Goals Review:   Goals and Risk Factor Review     Row Name 07/01/24 1151 08/10/24 1058 08/24/24 1043         Core Components/Risk Factors/Patient Goals Review   Personal Goals Review Improve shortness of breath with ADL's;Develop more efficient breathing techniques such as purse lipped breathing and diaphragmatic breathing and practicing self-pacing with activity.;Increase knowledge of respiratory medications and ability to use respiratory devices properly.;Weight Management/Obesity Improve shortness of breath with ADL's;Develop more efficient breathing techniques such as purse lipped breathing and diaphragmatic breathing and practicing self-pacing with activity.;Increase knowledge of respiratory  medications and ability to use respiratory devices properly.;Weight Management/Obesity Improve shortness of breath with ADL's;Develop more efficient breathing techniques such as purse lipped breathing and diaphragmatic breathing and practicing self-pacing with activity.;Increase knowledge of respiratory medications and ability to use respiratory devices properly.;Weight Management/Obesity     Review Alvan is doing great in rehab. Nickolaus has COPD and is on 3L/min via nasal cannula, continous, and wears a CPAP at bedtime; which are all working very well for him. Edon has noted some issues with constipation and more gas. We spoke about him following up with his GI doctor. Corban is doing well in rehab. He just came back from a two week break from his breathing being rough with allergies. He states it is a lot easier to breathe in the cooler months. He is taking all his medications as prescribed and checks his BP and O2 at home. Wilfredo is doing well in rehab. He feels his breathing is doing well since the cooler weather has came. He checks his oxygen  frequently and checks BP at home. He takes all his medications as prescribed.     Expected Outcomes Short: follow up with his GI doctor. Long: continue to wear his oxygen  devices as prescribed. Short: Continue to attend rehab. Long: Continue to wear his oxygen  devices as prescribed. Short: Continue to attend rehab. Long: Continue to wear his oxygen  devices as prescribed.        Core Components/Risk Factors/Patient Goals at Discharge (Final Review):   Goals and Risk Factor Review - 08/24/24 1043       Core Components/Risk Factors/Patient Goals Review   Personal Goals Review Improve shortness of breath with ADL's;Develop more efficient breathing techniques such as purse lipped breathing and diaphragmatic breathing and practicing self-pacing with activity.;Increase knowledge of respiratory medications and ability to use respiratory devices properly.;Weight  Management/Obesity    Review Proctor is doing well in rehab. He feels his breathing is doing well since the cooler weather has came. He checks his oxygen  frequently and checks BP at home. He takes all his medications as prescribed.    Expected Outcomes Short: Continue to attend rehab. Long: Continue to wear his oxygen  devices as prescribed.          ITP Comments:  ITP Comments     Row Name 05/11/24 1308 05/17/24 1029 05/19/24 0925 06/08/24 1037 06/16/24 1150   ITP Comments Completed virtual orientation today.  EP evaluation is scheduled for 05/17/24 at 1000 .  Documentation for diagnosis [facility] be found in [facility]/28/24. Patient attend orientation today.  Patient is attending Pulmonary Rehabilitation Program.  Documentation for diagnosis can be found in [facility].  Reviewed medical chart, RPE/RPD, gym safety, and program guidelines.  Patient was fitted to equipment they will be using during rehab.  Patient is scheduled to start exercise on 06/08/24.   Initial ITP created and sent for review and signature by Dr. Anton Kelp, Medical Director for Pulmonary Rehabilitation Program. 30 day review completed. ITP sent to Dr.Jehanzeb Memon, Medical Director of  Pulmonary Rehab. Continue with ITP unless changes are made by physician.  New to program, only attended orientation thus far. First full day of exercise!  Patient was oriented to gym and equipment including functions, settings, policies, and procedures.  Patient's individual exercise prescription and treatment plan were reviewed.  All starting workloads were established based on the results of the 6 minute walk test done at initial orientation visit.  The plan for exercise progression was also introduced and progression will be customized based on patient's performance and goals. 30 day review completed. ITP sent to Dr.Jehanzeb Memon, Medical Director of  Pulmonary Rehab. Continue with ITP unless changes are made by physician.   Still new to program with  start delayed to 06/08/24 and has only completed two exercise sessions thus far.    Row Name 07/14/24 1507 08/11/24 1527 09/08/24 0854       ITP Comments 30 day review completed. ITP sent to Dr.Jehanzeb Memon, Medical Director of  Pulmonary Rehab. Continue with ITP unless changes are made by physician. 30 day review completed. ITP sent to Dr.Jehanzeb Memon, Medical Director of  Pulmonary Rehab. Continue with ITP unless changes are made by physician. 30 day review completed. ITP sent to Dr.Jehanzeb Memon, Medical Director of  Pulmonary Rehab. Continue with ITP unless changes are made by physician.        Comments: 30 day review

## 2024-09-09 ENCOUNTER — Encounter (HOSPITAL_COMMUNITY)
Admission: RE | Admit: 2024-09-09 | Discharge: 2024-09-09 | Disposition: A | Discharge status: Home / Self Care | Source: Ambulatory Visit | Attending: Pulmonary Disease | Admitting: Pulmonary Disease

## 2024-09-09 DIAGNOSIS — J439 Emphysema, unspecified: Secondary | ICD-10-CM | POA: Insufficient documentation

## 2024-09-09 DIAGNOSIS — J4489 Other specified chronic obstructive pulmonary disease: Secondary | ICD-10-CM | POA: Diagnosis present

## 2024-09-09 NOTE — Progress Notes (Signed)
 Daily Session Note  Patient Details  Name: Ricardo Burch MRN: [id number] Date of Birth: 1961/09/08 Referring Provider:   Flowsheet Row PULMONARY REHAB OTHER RESP ORIENTATION from 05/17/2024 in [facility] CARDIAC REHABILITATION  Referring Provider Theophilus Roosevelt MD    Encounter Date: 09/09/2024  Check In:  Session Check In - 09/09/24 1015       Check-In   Supervising physician immediately available to respond to emergencies See telemetry face sheet for immediately available MD    Location [facility]    Staff Present Rolland Sake BSN, RN;Amaar Oshita Vicci, RN, BSN;Heather Con, BS, Exercise Physiologist    Virtual Visit No    Medication changes reported     No    Fall or balance concerns reported    No    Warm-up and Cool-down Not performed (comment)    Resistance Training Performed Yes    VAD Patient? No    PAD/SET Patient? No      Pain Assessment   Currently in Pain? No/denies    Pain Score 0-No pain    Multiple Pain Sites No          Capillary Blood Glucose: No results found for this or any previous visit (from the past 24 hours).    Social History   Tobacco Use  Smoking Status Former   Current packs/day: 0.00   Average packs/day: 1 pack/day for 38.0 years (38.0 ttl pk-yrs)   Types: Cigarettes   Start date: 02/20/1982   Quit date: 02/21/2020   Years since quitting: 4.5  Smokeless Tobacco Never    Goals Met:  Proper associated with RPD/PD & O2 Sat Independence with exercise equipment Using PLB without cueing & demonstrates good technique Exercise tolerated well No report of concerns or symptoms today Strength training completed today  Goals Unmet:  Not Applicable  Comments: Pt able to follow exercise prescription today without complaint.  Will continue to monitor for progression.

## 2024-09-14 ENCOUNTER — Encounter (HOSPITAL_COMMUNITY)
Admission: RE | Admit: 2024-09-14 | Discharge: 2024-09-14 | Disposition: A | Discharge status: Home / Self Care | Source: Ambulatory Visit | Attending: Pulmonary Disease | Admitting: Pulmonary Disease

## 2024-09-14 DIAGNOSIS — J4489 Other specified chronic obstructive pulmonary disease: Secondary | ICD-10-CM | POA: Diagnosis not present

## 2024-09-14 NOTE — Progress Notes (Signed)
 Daily Session Note  Patient Details  Name: Ricardo Burch MRN: [id number] Date of Birth: May 15, 1961 Referring Provider:   Flowsheet Row PULMONARY REHAB OTHER RESP ORIENTATION from 05/17/2024 in [facility] CARDIAC REHABILITATION  Referring Provider Theophilus Roosevelt MD    Encounter Date: 09/14/2024  Check In:  Session Check In - 09/14/24 1027       Check-In   Supervising physician immediately available to respond to emergencies See telemetry face sheet for immediately available MD    Location [facility]    Staff Present Powell Benders, BS, Exercise Physiologist;Brittany Jackquline, BSN, RN, WTA-C;Nola Botkins Zina, RN    Virtual Visit No    Medication changes reported     No    Fall or balance concerns reported    No    Warm-up and Cool-down Performed on first and last piece of equipment    Resistance Training Performed Yes    VAD Patient? No    PAD/SET Patient? No      Pain Assessment   Currently in Pain? No/denies          Capillary Blood Glucose: No results found for this or any previous visit (from the past 24 hours).    Social History   Tobacco Use  Smoking Status Former   Current packs/day: 0.00   Average packs/day: 1 pack/day for 38.0 years (38.0 ttl pk-yrs)   Types: Cigarettes   Start date: 02/20/1982   Quit date: 02/21/2020   Years since quitting: 4.5  Smokeless Tobacco Never    Goals Met:  Proper associated with RPD/PD & O2 Sat Independence with exercise equipment Using PLB without cueing & demonstrates good technique Exercise tolerated well No report of concerns or symptoms today Strength training completed today  Goals Unmet:  Not Applicable  Comments: Pt able to follow exercise prescription today without complaint.  Will continue to monitor for progression.

## 2024-09-15 ENCOUNTER — Other Ambulatory Visit: Payer: Self-pay | Admitting: Pulmonary Disease

## 2024-09-15 DIAGNOSIS — J438 Other emphysema: Secondary | ICD-10-CM

## 2024-09-16 ENCOUNTER — Encounter (HOSPITAL_COMMUNITY)

## 2024-09-21 ENCOUNTER — Encounter (HOSPITAL_COMMUNITY)
Admission: RE | Admit: 2024-09-21 | Discharge: 2024-09-21 | Disposition: A | Source: Ambulatory Visit | Attending: Pulmonary Disease

## 2024-09-21 DIAGNOSIS — J4489 Other specified chronic obstructive pulmonary disease: Secondary | ICD-10-CM

## 2024-09-21 NOTE — Progress Notes (Signed)
 Daily Session Note  Patient Details  Name: Ricardo Burch MRN: [id number] Date of Birth: 08/01/1961 Referring Provider:   Flowsheet Row PULMONARY REHAB OTHER RESP ORIENTATION from 05/17/2024 in [facility] CARDIAC REHABILITATION  Referring Provider Theophilus Roosevelt MD    Encounter Date: 09/21/2024  Check In:  Session Check In - 09/21/24 1025       Check-In   Supervising physician immediately available to respond to emergencies See telemetry face sheet for immediately available MD    Location [facility]    Staff Present Powell Benders, BS, Exercise Physiologist;Brittany Jackquline, BSN, RN, WTA-C;Laurena Valko Zina, RN    Virtual Visit No    Medication changes reported     No    Fall or balance concerns reported    No    Warm-up and Cool-down Performed on first and last piece of equipment    Resistance Training Performed Yes    VAD Patient? No    PAD/SET Patient? No      Pain Assessment   Currently in Pain? No/denies          Capillary Blood Glucose: No results found for this or any previous visit (from the past 24 hours).    Social History   Tobacco Use  Smoking Status Former   Current packs/day: 0.00   Average packs/day: 1 pack/day for 38.0 years (38.0 ttl pk-yrs)   Types: Cigarettes   Start date: 02/20/1982   Quit date: 02/21/2020   Years since quitting: 4.5  Smokeless Tobacco Never    Goals Met:  Proper associated with RPD/PD & O2 Sat Independence with exercise equipment Using PLB without cueing & demonstrates good technique Exercise tolerated well No report of concerns or symptoms today Strength training completed today  Goals Unmet:  Not Applicable  Comments: Pt able to follow exercise prescription today without complaint.  Will continue to monitor for progression.

## 2024-09-23 ENCOUNTER — Encounter (HOSPITAL_COMMUNITY)

## 2024-09-28 ENCOUNTER — Encounter (HOSPITAL_COMMUNITY)

## 2024-10-05 ENCOUNTER — Encounter (HOSPITAL_COMMUNITY)
Admission: RE | Admit: 2024-10-05 | Discharge: 2024-10-05 | Disposition: A | Discharge status: Home / Self Care | Source: Ambulatory Visit | Attending: Pulmonary Disease | Admitting: Pulmonary Disease

## 2024-10-05 ENCOUNTER — Encounter (HOSPITAL_COMMUNITY): Payer: Self-pay | Admitting: *Deleted

## 2024-10-05 DIAGNOSIS — J4489 Other specified chronic obstructive pulmonary disease: Secondary | ICD-10-CM

## 2024-10-05 DIAGNOSIS — J439 Emphysema, unspecified: Secondary | ICD-10-CM | POA: Insufficient documentation

## 2024-10-05 NOTE — Progress Notes (Signed)
 Pulmonary Individual Treatment Plan  Patient Details  Name: Ricardo Burch MRN: [id number] Date of Birth: 10-24-1961 Referring Provider:   Flowsheet Row PULMONARY REHAB OTHER RESP ORIENTATION from 05/17/2024 in [facility] CARDIAC REHABILITATION  Referring Provider Theophilus Roosevelt MD    Initial Encounter Date:  Flowsheet Row PULMONARY REHAB OTHER RESP ORIENTATION from 05/17/2024 in [facility]  Date 05/17/24    Visit Diagnosis: COPD with chronic bronchitis and emphysema (HCC)  Patient's Home Medications on Admission:   Current Outpatient Medications:    albuterol  (VENTOLIN  HFA) 108 (90 Base) MCG/ACT inhaler, INHALE 1-2 PUFFS BY MOUTH EVERY 6 HOURS AS NEEDED FOR WHEEZING AND SHORTNESS OF BREATH, Disp: 8.5 g, Rfl: 11   amLODipine  (NORVASC ) 5 MG tablet, TAKE 1 TABLET BY MOUTH DAILY, Disp: 30 tablet, Rfl: 11   ciprofloxacin -dexamethasone  (CIPRODEX ) OTIC suspension, Place 4 drops into the left ear 2 (two) times daily., Disp: 7.5 mL, Rfl: 0   desonide  (DESOWEN ) 0.05 % cream, Apply topically daily as needed., Disp: 30 g, Rfl: 0   fluticasone  (FLONASE ) 50 MCG/ACT nasal spray, INSTILL TWO (2) SPRAYS IN EACH NOSTRIL TWICE DAILY, Disp: 16 g, Rfl: 11   Fluticasone -Umeclidin-Vilant (TRELEGY ELLIPTA ) 200-62.5-25 MCG/ACT AEPB, INHALE 1 PUFF BY MOUTH DAILY, Disp: 60 each, Rfl: 9   furosemide  (LASIX ) 20 MG tablet, Take 20 mg by mouth daily., Disp: , Rfl:    levocetirizine (XYZAL ) 5 MG tablet, Take 1 tablet (5 mg total) by mouth every evening., Disp: 30 tablet, Rfl: 5   Olopatadine  HCl 0.2 % SOLN, Apply 1 drop to eye daily., Disp: 2.5 mL, Rfl: 0   OXYGEN , Inhale 3 L into the lungs daily. continuous, Disp: , Rfl:    OZEMPIC, 1 MG/DOSE, 4 MG/3ML SOPN, SMARTSIG:0.75 Milliliter(s) SUB-Q Once a Week, Disp: , Rfl:    roflumilast  (DALIRESP ) 500 MCG TABS tablet, TAKE 1 TABLET BY MOUTH DAILY, Disp: 30 tablet, Rfl: 11   sildenafil  (VIAGRA ) 50 MG tablet, Take 1 tablet (50 mg total) by mouth daily  as needed for erectile dysfunction., Disp: 30 tablet, Rfl: 1   spironolactone (ALDACTONE) 25 MG tablet, Take 25 mg by mouth daily., Disp: , Rfl:   Past Medical History: Past Medical History:  Diagnosis Date   Allergy    CHF (congestive heart failure) (HCC)    HFpEF   Chronic kidney disease    COPD (chronic obstructive pulmonary disease) (HCC) Dx 2015   Depression    Eczema    since childhood    Emphysema of lung (HCC)    Erectile dysfunction 08/18/2019   GERD (gastroesophageal reflux disease) 01/25/2020   Hyperlipidemia 01/12/2018   Hypertension    Oxygen  deficiency    Screening for HIV (human immunodeficiency virus)    Sleep apnea    CPAP   Substance abuse (HCC)    MJ, cocaine   Testicular failure 07/22/2019    Tobacco Use: Social History   Tobacco Use  Smoking Status Former   Current packs/day: 0.00   Average packs/day: 1 pack/day for 38.0 years (38.0 ttl pk-yrs)   Types: Cigarettes   Start date: 02/20/1982   Quit date: 02/21/2020   Years since quitting: 4.6  Smokeless Tobacco Never    Labs: Review Flowsheet  More data exists      Latest Ref Rng & Units 10/25/2020 05/16/2022 11/20/2022 05/19/2023 01/30/2024  Labs for ITP Cardiac and Pulmonary Rehab  Cholestrol 100 - 199 mg/dL 842  843  839  849  837   LDL (calc) 0 - 99  mg/dL 86  94  99  87  85   HDL-C >39 mg/dL 56  51  47  52  65   Trlycerides 0 - 149 mg/dL 64  52  70  49  60   Hemoglobin A1c 4.8 - 5.6 % 5.8  5.7  5.6  5.7  5.6     Capillary Blood Glucose: Lab Results  Component Value Date   GLUCAP 85 05/12/2024   GLUCAP 84 11/20/2019   GLUCAP 93 05/12/2018   GLUCAP 179 (H) 07/27/2016   GLUCAP 156 (H) 07/27/2016     Pulmonary Assessment Scores:  Pulmonary Assessment Scores     Row Name 05/11/24 1319         ADL UCSD   ADL Phase Entry     SOB Score total 39     Rest 0     Walk 3     Stairs 3     Bath 2     Dress 2     Shop 2       CAT Score   CAT Score 14       UCSD: Self-administered  rating of dyspnea associated with activities of daily living (ADLs) 6-point scale (0 = not at all to 5 = maximal or unable to do because of breathlessness)  Scoring Scores range from 0 to 120.  Minimally important difference is 5 units  CAT: CAT can identify the health impairment of COPD patients and is better correlated with disease progression.  CAT has a scoring range of zero to 40. The CAT score is classified into four groups of low (less than 10), medium (10 - 20), high (21-30) and very high (31-40) based on the impact level of disease on health status. A CAT score over 10 suggests significant symptoms.  A worsening CAT score could be explained by an exacerbation, poor medication adherence, poor inhaler technique, or progression of COPD or comorbid conditions.  CAT MCID is 2 points  mMRC: mMRC (Modified Medical Research Council) Dyspnea Scale is used to assess the degree of baseline functional disability in patients of respiratory disease due to dyspnea. No minimal important difference is established. A decrease in score of 1 point or greater is considered a positive change.   Pulmonary Function Assessment:  Pulmonary Function Assessment - 05/11/24 1311       Pulmonary Function Tests   FVC% 56 %    FEV1% 37 %    FEV1/FVC Ratio 61    DLCO% 50 %          Exercise Target Goals: Exercise Program Goal: Individual exercise prescription set using results from initial 6 min walk test and THRR while considering  patient's activity barriers and safety.   Exercise Prescription Goal: Initial exercise prescription builds to 30-45 minutes a day of aerobic activity, 2-3 days per week.  Home exercise guidelines will be given to patient during program as part of exercise prescription that the participant will acknowledge.  Activity Barriers & Risk Stratification:  Activity Barriers & Cardiac Risk Stratification - 05/11/24 1304       Activity Barriers & Cardiac Risk Stratification    Activity Barriers Muscular Weakness;Deconditioning;Shortness of Breath          6 Minute Walk:  6 Minute Walk     Row Name 05/17/24 1052         6 Minute Walk   Phase Initial     Distance 570 feet     Walk Time 3.46 minutes     #  of Rest Breaks 0     MPH 1.87     METS 1.8     RPE 13     Perceived Dyspnea  3     VO2 Peak 6.3     Symptoms Yes (comment)     Comments pt o2 dropped 74 at 3.46 min test stopped     Resting HR 74 bpm     Resting BP 112/78     Resting Oxygen  Saturation  90 %     Exercise Oxygen  Saturation  during 6 min walk 74 %     Max Ex. HR 93 bpm     Max Ex. BP 150/70     2 Minute Post BP 126/70       Interval HR   1 Minute HR 91     2 Minute HR 87     3 Minute HR 93     6 Minute HR 74     2 Minute Post HR 71     Interval Heart Rate? Yes       Interval Oxygen    Interval Oxygen ? Yes     Baseline Oxygen  Saturation % 90 %     1 Minute Oxygen  Saturation % 90 %     1 Minute Liters of Oxygen  0 L     2 Minute Oxygen  Saturation % 86 %     2 Minute Liters of Oxygen  0 L     3 Minute Oxygen  Saturation % 74 %     3 Minute Liters of Oxygen  0 L     6 Minute Oxygen  Saturation % 87 %     6 Minute Liters of Oxygen  0 L     2 Minute Post Oxygen  Saturation % 88 %     2 Minute Post Liters of Oxygen  0 L        Oxygen  Initial Assessment:  Oxygen  Initial Assessment - 05/11/24 1310       Home Oxygen    Home Oxygen  Device Portable Concentrator;Home Concentrator    Sleep Oxygen  Prescription CPAP    Liters per minute 3    Home Exercise Oxygen  Prescription Continuous    Liters per minute 3    Home Resting Oxygen  Prescription Continuous    Liters per minute 3    Compliance with Home Oxygen  Use Yes      Intervention   Short Term Goals To learn and exhibit compliance with exercise, home and travel O2 prescription;To learn and understand importance of monitoring SPO2 with pulse oximeter and demonstrate accurate use of the pulse oximeter.;To learn and understand  importance of maintaining oxygen  saturations>88%;To learn and demonstrate proper pursed lip breathing techniques or other breathing techniques. ;To learn and demonstrate proper use of respiratory medications    Long  Term Goals Exhibits compliance with exercise, home  and travel O2 prescription;Verbalizes importance of monitoring SPO2 with pulse oximeter and return demonstration;Maintenance of O2 saturations>88%;Exhibits proper breathing techniques, such as pursed lip breathing or other method taught during program session;Compliance with respiratory medication;Demonstrates proper use of MDI's          Oxygen  Re-Evaluation:  Oxygen  Re-Evaluation     Row Name 06/08/24 1038 07/01/24 1200 08/10/24 1100 08/24/24 1045 09/21/24 1110     Program Oxygen  Prescription   Program Oxygen  Prescription -- Continuous Continuous Continuous Continuous   Liters per minute -- 3 3 3 3      Home Oxygen    Home Oxygen  Device -- Portable Concentrator;Home Concentrator;E-Tanks Portable Concentrator;Home Concentrator;E-Tanks Portable Concentrator;Home Concentrator;E-Tanks  Portable Concentrator;Home Concentrator;E-Tanks   Sleep Oxygen  Prescription -- Continuous;CPAP Continuous;CPAP Continuous;CPAP Continuous;CPAP   Liters per minute -- 3 3 3 3    Home Exercise Oxygen  Prescription -- Continuous Continuous Continuous Continuous   Liters per minute -- 3 3 -- 3   Home Resting Oxygen  Prescription -- Continuous Continuous -- Continuous   Liters per minute -- 3 3 3 3    Compliance with Home Oxygen  Use -- -- Yes Yes Yes     Goals/Expected Outcomes   Short Term Goals -- To learn and exhibit compliance with exercise, home and travel O2 prescription;To learn and understand importance of monitoring SPO2 with pulse oximeter and demonstrate accurate use of the pulse oximeter.;To learn and understand importance of maintaining oxygen  saturations>88%;To learn and demonstrate proper pursed lip breathing techniques or other breathing  techniques. ;To learn and demonstrate proper use of respiratory medications To learn and exhibit compliance with exercise, home and travel O2 prescription;To learn and understand importance of monitoring SPO2 with pulse oximeter and demonstrate accurate use of the pulse oximeter.;To learn and understand importance of maintaining oxygen  saturations>88%;To learn and demonstrate proper pursed lip breathing techniques or other breathing techniques. ;To learn and demonstrate proper use of respiratory medications To learn and exhibit compliance with exercise, home and travel O2 prescription;To learn and understand importance of monitoring SPO2 with pulse oximeter and demonstrate accurate use of the pulse oximeter.;To learn and understand importance of maintaining oxygen  saturations>88%;To learn and demonstrate proper pursed lip breathing techniques or other breathing techniques. ;To learn and demonstrate proper use of respiratory medications To learn and exhibit compliance with exercise, home and travel O2 prescription;To learn and understand importance of monitoring SPO2 with pulse oximeter and demonstrate accurate use of the pulse oximeter.;To learn and understand importance of maintaining oxygen  saturations>88%;To learn and demonstrate proper pursed lip breathing techniques or other breathing techniques. ;To learn and demonstrate proper use of respiratory medications   Long  Term Goals -- Exhibits compliance with exercise, home  and travel O2 prescription;Verbalizes importance of monitoring SPO2 with pulse oximeter and return demonstration;Exhibits proper breathing techniques, such as pursed lip breathing or other method taught during program session;Maintenance of O2 saturations>88%;Compliance with respiratory medication;Demonstrates proper use of MDI's Exhibits compliance with exercise, home  and travel O2 prescription;Verbalizes importance of monitoring SPO2 with pulse oximeter and return demonstration;Exhibits  proper breathing techniques, such as pursed lip breathing or other method taught during program session;Maintenance of O2 saturations>88%;Compliance with respiratory medication;Demonstrates proper use of MDI's Exhibits compliance with exercise, home  and travel O2 prescription;Verbalizes importance of monitoring SPO2 with pulse oximeter and return demonstration;Exhibits proper breathing techniques, such as pursed lip breathing or other method taught during program session;Maintenance of O2 saturations>88%;Compliance with respiratory medication;Demonstrates proper use of MDI's Exhibits compliance with exercise, home  and travel O2 prescription;Verbalizes importance of monitoring SPO2 with pulse oximeter and return demonstration;Exhibits proper breathing techniques, such as pursed lip breathing or other method taught during program session;Maintenance of O2 saturations>88%;Compliance with respiratory medication;Demonstrates proper use of MDI's   Comments Reviewed PLB technique with pt.  Talked about how it works and it's importance in maintaining their exercise saturations. Danish continues to keep a check on his oxygen  saturations at home. He wears his cpap at bedtime. He notes he breathing is good here and at home, noting when he feels exerted he stops and does his PLB techniques. Aasim is doing well in rehab. He just came back from a two week break from his breathing being rough with allergies. He states  it is a lot easier to breathe in the cooler months. When he feels SOB he uses his PLB techniques. Ojani is doing well in rehab. He feels his breathing is ok, it hasn't gotten any worse. The cooler months make is easier to breathe. He is compliant with his oxygen  and CPAP. He is going to ask his doctor about doing another sleep study to see if he can get off his CPAP at night. Phyllip is doing well in rehab. He feels his breathing is good and it hasn't gotten any worse. He feels that cooler months make is easier to  breathe. He is compliant with his oxygen  and CPAP. He is going to ask his doctor about doing another sleep study to see if his CPAP needs to be readjusted die to weight loss   Goals/Expected Outcomes Short: Become more profiecient at using PLB.   Long: Become independent at using PLB. Short: continue to attand rehab. Long: continue using PLB techniques when needed. Short: continue to attand rehab. Long: continue using PLB techniques when needed. Short: continue to attand rehab. Long: Speak with doctor about another sleep study. Short: follow up with Pulm Dr,   long: continue to use oxygen  and CPAP      Oxygen  Discharge (Final Oxygen  Re-Evaluation):  Oxygen  Re-Evaluation - 09/21/24 1110       Program Oxygen  Prescription   Program Oxygen  Prescription Continuous    Liters per minute 3      Home Oxygen    Home Oxygen  Device Portable Concentrator;Home Concentrator;E-Tanks    Sleep Oxygen  Prescription Continuous;CPAP    Liters per minute 3    Home Exercise Oxygen  Prescription Continuous    Liters per minute 3    Home Resting Oxygen  Prescription Continuous    Liters per minute 3    Compliance with Home Oxygen  Use Yes      Goals/Expected Outcomes   Short Term Goals To learn and exhibit compliance with exercise, home and travel O2 prescription;To learn and understand importance of monitoring SPO2 with pulse oximeter and demonstrate accurate use of the pulse oximeter.;To learn and understand importance of maintaining oxygen  saturations>88%;To learn and demonstrate proper pursed lip breathing techniques or other breathing techniques. ;To learn and demonstrate proper use of respiratory medications    Long  Term Goals Exhibits compliance with exercise, home  and travel O2 prescription;Verbalizes importance of monitoring SPO2 with pulse oximeter and return demonstration;Exhibits proper breathing techniques, such as pursed lip breathing or other method taught during program session;Maintenance of O2  saturations>88%;Compliance with respiratory medication;Demonstrates proper use of MDI's    Comments Stafford is doing well in rehab. He feels his breathing is good and it hasn't gotten any worse. He feels that cooler months make is easier to breathe. He is compliant with his oxygen  and CPAP. He is going to ask his doctor about doing another sleep study to see if his CPAP needs to be readjusted die to weight loss    Goals/Expected Outcomes Short: follow up with Pulm Dr,   long: continue to use oxygen  and CPAP          Initial Exercise Prescription:  Initial Exercise Prescription - 05/17/24 1000       Date of Initial Exercise RX and Referring Provider   Date 05/17/24    Referring Provider Theophilus Roosevelt MD      Oxygen    Oxygen  Continuous    Liters 3    Maintain Oxygen  Saturation 88% or higher      Treadmill  MPH 1.2    Grade 0    Minutes 15    METs 1.92      REL-XR   Level 1    Speed 50    Minutes 15    METs 1.9      Prescription Details   Frequency (times per week) 2    Duration Progress to 30 minutes of continuous aerobic without signs/symptoms of physical distress      Intensity   THRR 40-80% of Max Heartrate 108-143    Ratings of Perceived Exertion 11-13    Perceived Dyspnea 0-4      Resistance Training   Training Prescription Yes    Weight 7    Reps 10-15          Perform Capillary Blood Glucose checks as needed.  Exercise Prescription Changes:   Exercise Prescription Changes     Row Name 05/17/24 1100 06/29/24 1500 07/29/24 1500 08/17/24 1500 09/21/24 1500     Response to Exercise   Blood Pressure (Admit) 112/78 105/70 122/62 122/70 126/62   Blood Pressure (Exercise) 150/70 134/76 -- -- --   Blood Pressure (Exit) 126/70 118/60 112/70 100/68 118/64   Heart Rate (Admit) 74 bpm 78 bpm 63 bpm 79 bpm 69 bpm   Heart Rate (Exercise) 93 bpm 100 bpm 84 bpm 92 bpm 82 bpm   Heart Rate (Exit) 71 bpm 77 bpm 73 bpm 85 bpm 77 bpm   Oxygen  Saturation (Admit) 90 %  91 % 90 % 90 % 90 %   Oxygen  Saturation (Exercise) 74 % 87 % 87 % 89 % 91 %   Oxygen  Saturation (Exit) 88 % 94 % 91 % 91 % 95 %   Rating of Perceived Exertion (Exercise) 13 12 11 12 11    Perceived Dyspnea (Exercise) 3 2 1 1 1    Duration Progress to 30 minutes of  aerobic without signs/symptoms of physical distress Continue with 30 min of aerobic exercise without signs/symptoms of physical distress. Continue with 30 min of aerobic exercise without signs/symptoms of physical distress. Continue with 30 min of aerobic exercise without signs/symptoms of physical distress. Continue with 30 min of aerobic exercise without signs/symptoms of physical distress.   Intensity THRR unchanged THRR unchanged THRR unchanged THRR unchanged THRR unchanged     Progression   Progression -- Continue to progress workloads to maintain intensity without signs/symptoms of physical distress. Continue to progress workloads to maintain intensity without signs/symptoms of physical distress. Continue to progress workloads to maintain intensity without signs/symptoms of physical distress. Continue to progress workloads to maintain intensity without signs/symptoms of physical distress.     Resistance Training   Training Prescription -- Yes Yes Yes Yes   Weight -- 7 7 7 7    Reps -- 10-15 10-15 10-15 10-15     Oxygen    Oxygen  -- Continuous Continuous Continuous Continuous   Liters -- 4 4 3 3      Treadmill   MPH -- 1.7 1 1.4 1.4   Grade -- 0 0 0 0   Minutes -- 15 15 15 15    METs -- 2.3 1.77 2.07 2.07     NuStep   Level -- -- -- 4 4   SPM -- -- -- 95 90   Minutes -- -- -- 15 15   METs -- -- -- 2.7 2.1     REL-XR   Level -- 1 3 -- --   Speed -- 46 94 -- --   Minutes -- 15 15 -- --  METs -- 2.1 2.6 -- --     Oxygen    Maintain Oxygen  Saturation -- 88% or higher 88% or higher 88% or higher 88% or higher      Exercise Comments:   Exercise Comments     Row Name 05/11/24 1310 05/17/24 1029 06/08/24 1037        Exercise Comments Karron currently doesn't do any home exercise besides ADL's and walking his dog. Patient attend orientation today.  Patient is attending Pulmonary Rehabilitation Program.  Documentation for diagnosis can be found in [facility].  Reviewed medical chart, RPE/RPD, gym safety, and program guidelines.  Patient was fitted to equipment they will be using during rehab.  Patient is scheduled to start exercise on 06/08/24.   Initial ITP created and sent for review and signature by Dr. Anton Kelp, Medical Director for Pulmonary Rehabilitation Program. First full day of exercise!  Patient was oriented to gym and equipment including functions, settings, policies, and procedures.  Patient's individual exercise prescription and treatment plan were reviewed.  All starting workloads were established based on the results of the 6 minute walk test done at initial orientation visit.  The plan for exercise progression was also introduced and progression will be customized based on patient's performance and goals.        Exercise Goals and Review:   Exercise Goals     Row Name 05/11/24 1309             Exercise Goals   Increase Physical Activity Yes       Intervention Provide advice, education, support and counseling about physical activity/exercise needs.;Develop an individualized exercise prescription for aerobic and resistive training based on initial evaluation findings, risk stratification, comorbidities and participant's personal goals.       Expected Outcomes Short Term: Attend rehab on a regular basis to increase amount of physical activity.;Long Term: Add in home exercise to make exercise part of routine and to increase amount of physical activity.;Long Term: Exercising regularly at least 3-5 days a week.       Increase Strength and Stamina Yes       Intervention Provide advice, education, support and counseling about physical activity/exercise needs.;Develop an individualized exercise prescription  for aerobic and resistive training based on initial evaluation findings, risk stratification, comorbidities and participant's personal goals.       Expected Outcomes Short Term: Increase workloads from initial exercise prescription for resistance, speed, and METs.;Short Term: Perform resistance training exercises routinely during rehab and add in resistance training at home;Long Term: Improve cardiorespiratory fitness, muscular endurance and strength as measured by increased METs and functional capacity ( )       Able to understand and use rate of perceived exertion (RPE) scale Yes       Intervention Provide education and explanation on how to use RPE scale       Expected Outcomes Short Term: Able to use RPE daily in rehab to express subjective intensity level;Long Term:  Able to use RPE to guide intensity level when exercising independently       Able to understand and use Dyspnea scale Yes       Intervention Provide education and explanation on how to use Dyspnea scale       Expected Outcomes Short Term: Able to use Dyspnea scale daily in rehab to express subjective sense of shortness of breath during exertion;Long Term: Able to use Dyspnea scale to guide intensity level when exercising independently       Knowledge and understanding  of Target Heart Rate Range (THRR) Yes       Intervention Provide education and explanation of THRR including how the numbers were predicted and where they are located for reference       Expected Outcomes Short Term: Able to state/look up THRR;Short Term: Able to use daily as guideline for intensity in rehab;Long Term: Able to use THRR to govern intensity when exercising independently       Able to check pulse independently Yes       Intervention Provide education and demonstration on how to check pulse in carotid and radial arteries.;Review the importance of being able to check your own pulse for safety during independent exercise       Expected Outcomes Short Term:  Able to explain why pulse checking is important during independent exercise;Long Term: Able to check pulse independently and accurately       Understanding of Exercise Prescription Yes       Intervention Provide education, explanation, and written materials on patient's individual exercise prescription       Expected Outcomes Short Term: Able to explain program exercise prescription;Long Term: Able to explain home exercise prescription to exercise independently          Exercise Goals Re-Evaluation :  Exercise Goals Re-Evaluation     Row Name 06/08/24 1037 07/01/24 0941 07/01/24 1155 08/03/24 0838 08/10/24 1101     Exercise Goal Re-Evaluation   Exercise Goals Review Able to understand and use rate of perceived exertion (RPE) scale;Knowledge and understanding of Target Heart Rate Range (THRR) Increase Physical Activity;Increase Strength and Stamina;Understanding of Exercise Prescription Increase Physical Activity;Increase Strength and Stamina;Able to understand and use Dyspnea scale;Understanding of Exercise Prescription;Knowledge and understanding of Target Heart Rate Range (THRR);Able to understand and use rate of perceived exertion (RPE) scale Increase Physical Activity;Increase Strength and Stamina;Understanding of Exercise Prescription Increase Physical Activity;Increase Strength and Stamina;Able to understand and use Dyspnea scale;Knowledge and understanding of Target Heart Rate Range (THRR);Able to understand and use rate of perceived exertion (RPE) scale;Understanding of Exercise Prescription   Comments Reviewed RPE and dyspnea scale, THR and program prescription with pt today.  Pt voiced understanding and was given a copy of goals to take home. Bentzion has completed 7 sessions of rehab. He has been doing well with the program . He has had to increase his oxygen  to 4L during exercise due to excerting hisself. Will continue to montior and progress as able Joash is doing great in rehab. He notes he  doesn't really exercise at home but he does do lots of things around the house. Spoke with him about maybe starting to walk his street, even with his dog, also did tell him to make sure the weather was ok when he did so his breathing would be ok. Aaliyah has completed 13 sessions of PR. He has be increasing his level on the nustep and has high spm. Is is still walking at a speed of 1.0 MPH on the treadmill but does not need to take as many breaks. Will continue to moniotr and progress as able. Jaleen is doing well in rehab. He is doing some walking at home. He is increasing his levels on the machines here at rehab when his oxygen  allows.   Expected Outcomes Short: Use RPE daily to regulate intensity.  Long: Follow program prescription in THR. Short: contiiue to increase levels and speed on treadmill   long: continue to attend rehab Short: continue to attend rehab. Long: walk more at home.  Short: continue to walk the treadmill  long: continue to atted rehab and exercise Short: Continue to attend rehab. Long: Increase exercise at home.    Row Name 08/18/24 1307 08/24/24 1044 09/21/24 1102         Exercise Goal Re-Evaluation   Exercise Goals Review Increase Physical Activity;Increase Strength and Stamina;Understanding of Exercise Prescription Increase Physical Activity;Increase Strength and Stamina;Able to understand and use rate of perceived exertion (RPE) scale Increase Physical Activity;Increase Strength and Stamina;Understanding of Exercise Prescription     Comments Lathen has completed 16 sessions of PR. HE is walking on the treadmiill in class and has had good oxygen  levels WNL. Will continue to monitor and progress as abe Poseidon is doing well in rehab. He is trying to increase his levels on the treadmill and Nustep, yet he has to take breaks because his breathing will get tough and O2 sat's will drop. He does some walking at home, yet states he could incorpoate more. Torence is doing well in rehab. He has noticed  and increase in energy and feeling better since starting the program. He is walking at home and is finding an inside area to walk since the weater is getting colder     Expected Outcomes Short: continue to increase levels on the treadmill   long: conitnue to attend rehab Short: Continue to attend rehab. Long: Incorporate more exercise at home. Short: continue to attend rehab   long: contonue to exercise at home        Discharge Exercise Prescription (Final Exercise Prescription Changes):  Exercise Prescription Changes - 09/21/24 1500       Response to Exercise   Blood Pressure (Admit) 126/62    Blood Pressure (Exit) 118/64    Heart Rate (Admit) 69 bpm    Heart Rate (Exercise) 82 bpm    Heart Rate (Exit) 77 bpm    Oxygen  Saturation (Admit) 90 %    Oxygen  Saturation (Exercise) 91 %    Oxygen  Saturation (Exit) 95 %    Rating of Perceived Exertion (Exercise) 11    Perceived Dyspnea (Exercise) 1    Duration Continue with 30 min of aerobic exercise without signs/symptoms of physical distress.    Intensity THRR unchanged      Progression   Progression Continue to progress workloads to maintain intensity without signs/symptoms of physical distress.      Resistance Training   Training Prescription Yes    Weight 7    Reps 10-15      Oxygen    Oxygen  Continuous    Liters 3      Treadmill   MPH 1.4    Grade 0    Minutes 15    METs 2.07      NuStep   Level 4    SPM 90    Minutes 15    METs 2.1      Oxygen    Maintain Oxygen  Saturation 88% or higher          Nutrition:  Target Goals: Understanding of nutrition guidelines, daily intake of sodium 1500mg , cholesterol 200mg , calories 30% from fat and 7% or less from saturated fats, daily to have 5 or more servings of fruits and vegetables.  Biometrics:  Pre Biometrics - 05/17/24 1102       Pre Biometrics   Height 5' 9 (1.753 m)    Weight 238 lb 1.6 oz (108 kg)    Waist Circumference 47 inches    Hip Circumference 42  inches  Waist to Hip Ratio 1.12 %    BMI (Calculated) 35.14    Grip Strength 44.2 kg           Nutrition Therapy Plan and Nutrition Goals:  Nutrition Therapy & Goals - 05/11/24 1313       Intervention Plan   Intervention Prescribe, educate and counsel regarding individualized specific dietary modifications aiming towards targeted core components such as weight, hypertension, lipid management, diabetes, heart failure and other comorbidities.;Nutrition handout(s) given to patient.    Expected Outcomes Short Term Goal: Understand basic principles of dietary content, such as calories, fat, sodium, cholesterol and nutrients.;Short Term Goal: A plan has been developed with personal nutrition goals set during dietitian appointment.;Long Term Goal: Adherence to prescribed nutrition plan.          Nutrition Assessments:  MEDIFICTS Score Key: >=70 Need to make dietary changes  40-70 Heart Healthy Diet <= 40 Therapeutic Level Cholesterol Diet  Flowsheet Row PULMONARY VIRTUAL BASED CARE from 05/11/2024 in [facility]  Picture Your Plate Total Score on Admission 49   Picture Your Plate Scores: <59 Unhealthy dietary pattern with much room for improvement. 41-50 Dietary pattern unlikely to meet recommendations for good health and room for improvement. 51-60 More healthful dietary pattern, with some room for improvement.  >60 Healthy dietary pattern, although there may be some specific behaviors that could be improved.    Nutrition Goals Re-Evaluation:  Nutrition Goals Re-Evaluation     Row Name 07/01/24 1145 08/10/24 1057 08/24/24 1041 09/21/24 1105       Goals   Nutrition Goal Short: start to incorporate more fiber in his diet. Long: eat 3 well-balanced meals per day. Healthy eating Healthy eating Healthy eating    Comment Richardo notes he is not eating the best at home, he does not eat much and not very well-balanced meals, he does like to eat fish and chicken  and salads. I talked with Zeke about continuing fish and chicken for protein, and maybe switching his salads with iceberg lettuce to more dark-green leafy. Taiga also complains of constipation, so I spoke with him about his fiber intake, I mentioned metamucial supplement and foods that are high in fiber. Dickey notes he is not eating the best at home, he does not eat much and not very well-balanced meals, he does like to eat fish and chicken and salads. I talked with Chael about continuing fish and chicken for protein, and maybe switching his salads with iceberg lettuce to more dark-green leafy. Bertel also complains of constipation, so I spoke with him about his fiber intake, I mentioned metamucial supplement and foods that are high in fiber. Jaydenn states that he is still struggling with his diet a little bit. He mainly eats 2 meals a day and something Meunier for lunch. He does state that he cheats a little on the weekends. Encouraged him to limit cheat days, yet still have them in moderation. He feels he is drinking enough water throughout the day. Jaiquan continues to struggle a little with his diet. He is eating two full meals a day and a snack at night. He continues to have cheat days on the weekends. He has noticed that he has gained some weight and his doctor wants him to start back on ozempic to help lose weight. He will start this after the holidays. He does drink a good amout of water during the day.    Expected Outcome Short: start to incorporate more fiber in his diet.  Long: eat 3 well-balanced meals per day. Short: Continue to attend rehab. Long: Incorporate more fiber into his diet. Short: Continue to attend rehab. Long: Incorporate more healthy options into his diet. Short: start back with weightloss shot   long : continue to pick healtier options       Nutrition Goals Discharge (Final Nutrition Goals Re-Evaluation):  Nutrition Goals Re-Evaluation - 09/21/24 1105       Goals   Nutrition Goal Healthy  eating    Comment Mikhael continues to struggle a little with his diet. He is eating two full meals a day and a snack at night. He continues to have cheat days on the weekends. He has noticed that he has gained some weight and his doctor wants him to start back on ozempic to help lose weight. He will start this after the holidays. He does drink a good amout of water during the day.    Expected Outcome Short: start back with weightloss shot   long : continue to pick healtier options          Psychosocial: Target Goals: Acknowledge presence or absence of significant depression and/or stress, maximize coping skills, provide positive support system. Participant is able to verbalize types and ability to use techniques and skills needed for reducing stress and depression.  Initial Review & Psychosocial Screening:  Initial Psych Review & Screening - 05/11/24 1313       Initial Review   Current issues with None Identified      Family Dynamics   Good Support System? Yes    Comments Mackie didn't specify who his support system was, but he states he has people to help him if needed.      Barriers   Psychosocial barriers to participate in program There are no identifiable barriers or psychosocial needs.      Screening Interventions   Interventions Encouraged to exercise    Expected Outcomes Short Term goal: Identification and review with participant of any Quality of Life or Depression concerns found by scoring the questionnaire.;Long Term goal: The participant improves quality of Life and PHQ9 Scores as seen by post scores and/or verbalization of changes;Long Term Goal: Stressors or current issues are controlled or eliminated.;Short Term goal: Utilizing psychosocial counselor, staff and physician to assist with identification of specific Stressors or current issues interfering with healing process. Setting desired goal for each stressor or current issue identified.          Quality of Life  Scores:  Scores of 19 and below usually indicate a poorer quality of life in these areas.  A difference of  2-3 points is a clinically meaningful difference.  A difference of 2-3 points in the total score of the Quality of Life Index has been associated with significant improvement in overall quality of life, self-image, physical symptoms, and general health in studies assessing change in quality of life.   PHQ-9: Review Flowsheet  More data exists      07/23/2024 06/18/2024 05/17/2024 02/11/2024 01/20/2024  Depression screen PHQ 2/9  Decreased Interest 0 0 0 1 0 0  Down, Depressed, Hopeless 0 0 0 1 0 0  PHQ - 2 Score 0 0 0 2 0 0  Altered sleeping 0 0 0 1 0 0  Tired, decreased energy 0 0 1 2 0 0  Change in appetite 0 0 0 0 0 0  Feeling bad or failure about yourself  0 0 0 1 0 0  Trouble concentrating 0 0 0 0  0 0  Moving slowly or fidgety/restless 0 0 1 2 0 0  Suicidal thoughts 0 0 0 0 0 0  PHQ-9 Score 0  0  2  8  0  0   Difficult doing work/chores Not difficult at all Not difficult at all Not difficult at all Somewhat difficult Not difficult at all Not difficult at all    Details       Data saved with a previous flowsheet row definition   Multiple values from one day are sorted in reverse-chronological order        Interpretation of Total Score  Total Score Depression Severity:  1-4 = Minimal depression, 5-9 = Mild depression, 10-14 = Moderate depression, 15-19 = Moderately severe depression, 20-27 = Severe depression   Psychosocial Evaluation and Intervention:  Psychosocial Evaluation - 05/11/24 1314       Psychosocial Evaluation & Interventions   Interventions Encouraged to exercise with the program and follow exercise prescription    Comments Thatcher is a pleasant gentleman who is coming into rehab for COPD with chronic bronchitis and emphysema. He has been in the program before but it was for cardiac reasoning.  Seanmichael states  that he gets around well and is independent with his  ADL's. He does not currently do any home exercise besides ADL's and walking his dog outside. He is on 3L oxygen  as needed, and a CPAP at night with 3L. He is having a colonscopy tomorrow and is not very happy about that, but he is eager to start back our program starting Monday the 14th.    Expected Outcomes Short: Increase strength and stamina. Long: Get off oxygen  or be able to wean down eventually.    Continue Psychosocial Services  Follow up required by staff          Psychosocial Re-Evaluation:  Psychosocial Re-Evaluation     Row Name 07/01/24 1141 08/10/24 1056 08/24/24 1040 09/21/24 1104       Psychosocial Re-Evaluation   Current issues with None Identified Current Stress Concerns None Identified None Identified    Comments Mercedes is doing great in rehab. Dominyck notes that he is staying busy at home, and going out and doing things with his wife. Hayk is enjoying life and coping well. Leonell is doing well in rehab. He is just now coming back from about a 2 week break. His breathing was a little rough from allergies, yet he sais he has had a lot going on as one of his twin daughters is engaged and had an engagement party. His stress is mainly happy stress regarding his daughters engagement. Rober is doing well in rehab. He currently identifies no stressors or sleep issues at the moment. Collier is doing well in rehab. He currently identifies no stressors. He has noticed in the morning her feels bloated and was wondering if it was from the CPAP. He has lost weight and might need the CPAP adjusted    Expected Outcomes Short: continue to attend rehab. Long: continue to stay active in activities that make him happy. Short: continue to attend rehab. Long: Enjoy the engagement of his daughter. Short: Continue to attend rehab. Long: Continue to have healthy stress outlets. Short: continue pulm about CPAP    long: continue to have healthy stress outlet    Interventions Encouraged to attend Pulmonary  Rehabilitation for the exercise Encouraged to attend Pulmonary Rehabilitation for the exercise;Stress management education Encouraged to attend Pulmonary Rehabilitation for the exercise Encouraged to attend Pulmonary Rehabilitation  for the exercise    Continue Psychosocial Services  Follow up required by staff Follow up required by staff Follow up required by staff Follow up required by staff       Psychosocial Discharge (Final Psychosocial Re-Evaluation):  Psychosocial Re-Evaluation - 09/21/24 1104       Psychosocial Re-Evaluation   Current issues with None Identified    Comments Jaysion is doing well in rehab. He currently identifies no stressors. He has noticed in the morning her feels bloated and was wondering if it was from the CPAP. He has lost weight and might need the CPAP adjusted    Expected Outcomes Short: continue pulm about CPAP    long: continue to have healthy stress outlet    Interventions Encouraged to attend Pulmonary Rehabilitation for the exercise    Continue Psychosocial Services  Follow up required by staff           Education: Education Goals: Education classes will be provided on a weekly basis, covering required topics. Participant will state understanding/return demonstration of topics presented.  Learning Barriers/Preferences:  Learning Barriers/Preferences - 05/11/24 1313       Learning Barriers/Preferences   Learning Barriers None    Learning Preferences Video;Written Material;Pictoral          Education Topics: How Lungs Work and Diseases: - Discuss the anatomy of the lungs and diseases that can affect the lungs, such as COPD. Flowsheet Row PULMONARY REHAB OTHER RESPIRATORY from 06/08/2020 in [facility]  Date 03/12/18  Educator D. Coad  Instruction Review Code 2- Demonstrated Understanding    Exercise: -Discuss the importance of exercise, FITT principles of exercise, normal and abnormal responses to exercise, and how to  exercise safely. Flowsheet Row PULMONARY REHAB OTHER RESPIRATORY from 06/08/2020 in [place] [facility]  Date 03/05/18  Educator GC  Instruction Review Code 2- Demonstrated Understanding    Environmental Irritants: -Discuss types of environmental irritants and how to limit exposure to environmental irritants. Flowsheet Row PULMONARY REHAB OTHER RESPIRATORY from 06/08/2020 in [place] [facility]  Date 05/18/20  Educator CHARM Louder  Instruction Review Code 1- Verbalizes Understanding    Meds/Inhalers and oxygen : - Discuss respiratory medications, definition of an inhaler and oxygen , and the proper way to use an inhaler and oxygen . Flowsheet Row PULMONARY REHAB OTHER RESPIRATORY from 06/08/2020 in [place] [facility]  Date 03/26/18  Educator DC    Energy Saving Techniques: - Discuss methods to conserve energy and decrease shortness of breath when performing activities of daily living.  Flowsheet Row PULMONARY REHAB OTHER RESPIRATORY from 06/08/2020 in [place] [facility]  Date 04/02/18  Educator GC  Instruction Review Code 2- Demonstrated Understanding    Bronchial Hygiene / Breathing Techniques: - Discuss breathing mechanics, pursed-lip breathing technique,  proper posture, effective ways to clear airways, and other functional breathing techniques Flowsheet Row PULMONARY REHAB OTHER RESPIRATORY from 06/08/2020 in [place] [facility]  Date 04/09/18  Educator DC  Instruction Review Code 2- Demonstrated Understanding    Cleaning Equipment: - Provides group verbal and written instruction about the health risks of elevated stress, cause of high stress, and healthy ways to reduce stress. Flowsheet Row PULMONARY REHAB OTHER RESPIRATORY from 06/08/2020 in [place] [facility]  Date 01/15/18  Educator GC  Instruction Review Code 2- Demonstrated Understanding    Nutrition I: Fats: - Discuss the types of  cholesterol, what cholesterol does to the body, and how cholesterol levels can be controlled. Flowsheet Row PULMONARY  REHAB OTHER RESPIRATORY from 06/08/2020 in [place] [facility]  Date 01/22/18  Educator GC  Instruction Review Code 2- Demonstrated Understanding    Nutrition II: Labels: -Discuss the different components of food labels and how to read food labels. Flowsheet Row PULMONARY REHAB OTHER RESPIRATORY from 06/08/2020 in [place] [facility]  Date 01/29/18  Educator DJ  Instruction Review Code 2- Demonstrated Understanding    Respiratory Infections: - Discuss the signs and symptoms of respiratory infections, ways to prevent respiratory infections, and the importance of seeking medical treatment when having a respiratory infection. Flowsheet Row PULMONARY REHAB OTHER RESPIRATORY from 06/08/2020 in [place] [facility]  Date 02/05/18  Educator GC  Instruction Review Code 2- Demonstrated Understanding    Stress I: Signs and Symptoms: - Discuss the causes of stress, how stress may lead to anxiety and depression, and ways to limit stress.   Stress II: Relaxation: -Discuss relaxation techniques to limit stress. Flowsheet Row PULMONARY REHAB OTHER RESPIRATORY from 06/08/2020 in [place] [facility]  Date 04/20/20  Educator DF  Instruction Review Code 2- Demonstrated Understanding    Oxygen  for Home/Travel: - Discuss how to prepare for travel when on oxygen  and proper ways to transport and store oxygen  to ensure safety.   Knowledge Questionnaire Score:  Knowledge Questionnaire Score - 05/11/24 1324       Knowledge Questionnaire Score   Pre Score 15/18          Core Components/Risk Factors/Patient Goals at Admission:  Personal Goals and Risk Factors at Admission - 05/11/24 1312       Core Components/Risk Factors/Patient Goals on Admission   Improve shortness of breath with ADL's Yes    Intervention Provide education,  individualized exercise plan and daily activity instruction to help decrease symptoms of SOB with activities of daily living.    Expected Outcomes Short Term: Improve cardiorespiratory fitness to achieve a reduction of symptoms when performing ADLs;Long Term: Be able to perform more ADLs without symptoms or delay the onset of symptoms    Increase knowledge of respiratory medications and ability to use respiratory devices properly  Yes    Intervention Provide education and demonstration as needed of appropriate use of medications, inhalers, and oxygen  therapy.    Expected Outcomes Long Term: Maintain appropriate use of medications, inhalers, and oxygen  therapy.;Short Term: Achieves understanding of medications use. Understands that oxygen  is a medication prescribed by physician. Demonstrates appropriate use of inhaler and oxygen  therapy.    Heart Failure Yes    Intervention Provide a combined exercise and nutrition program that is supplemented with education, support and counseling about heart failure. Directed toward relieving symptoms such as shortness of breath, decreased exercise tolerance, and extremity edema.    Expected Outcomes Improve functional capacity of life;Short term: Attendance in program 2-3 days a week with increased exercise capacity. Reported lower sodium intake. Reported increased fruit and vegetable intake. Reports medication compliance.;Short term: Daily weights obtained and reported for increase. Utilizing diuretic protocols set by physician.;Long term: Adoption of self-care skills and reduction of barriers for early signs and symptoms recognition and intervention leading to self-care maintenance.    Hypertension Yes    Intervention Provide education on lifestyle modifcations including regular physical activity/exercise, weight management, moderate sodium restriction and increased consumption of fresh fruit, vegetables, and low fat dairy, alcohol moderation, and smoking  cessation.;Monitor prescription use compliance.    Expected Outcomes Short Term: Continued assessment and intervention until BP is < 140/82mm HG in hypertensive participants. <  130/41mm HG in hypertensive participants with diabetes, heart failure or chronic kidney disease.;Long Term: Maintenance of blood pressure at goal levels.    Lipids Yes    Intervention Provide education and support for participant on nutrition & aerobic/resistive exercise along with prescribed medications to achieve LDL 70mg , HDL >40mg .    Expected Outcomes Long Term: Cholesterol controlled with medications as prescribed, with individualized exercise RX and with personalized nutrition plan. Value goals: LDL < 70mg , HDL > 40 mg.;Short Term: Participant states understanding of desired cholesterol values and is compliant with medications prescribed. Participant is following exercise prescription and nutrition guidelines.          Core Components/Risk Factors/Patient Goals Review:   Goals and Risk Factor Review     Row Name 07/01/24 1151 08/10/24 1058 08/24/24 1043 09/21/24 1108       Core Components/Risk Factors/Patient Goals Review   Personal Goals Review Improve shortness of breath with ADL's;Develop more efficient breathing techniques such as purse lipped breathing and diaphragmatic breathing and practicing self-pacing with activity.;Increase knowledge of respiratory medications and ability to use respiratory devices properly.;Weight Management/Obesity Improve shortness of breath with ADL's;Develop more efficient breathing techniques such as purse lipped breathing and diaphragmatic breathing and practicing self-pacing with activity.;Increase knowledge of respiratory medications and ability to use respiratory devices properly.;Weight Management/Obesity Improve shortness of breath with ADL's;Develop more efficient breathing techniques such as purse lipped breathing and diaphragmatic breathing and practicing self-pacing with  activity.;Increase knowledge of respiratory medications and ability to use respiratory devices properly.;Weight Management/Obesity Improve shortness of breath with ADL's;Develop more efficient breathing techniques such as purse lipped breathing and diaphragmatic breathing and practicing self-pacing with activity.;Increase knowledge of respiratory medications and ability to use respiratory devices properly.;Weight Management/Obesity    Review Quanta is doing great in rehab. Doroteo has COPD and is on 3L/min via nasal cannula, continous, and wears a CPAP at bedtime; which are all working very well for him. Tierre has noted some issues with constipation and more gas. We spoke about him following up with his GI doctor. Alin is doing well in rehab. He just came back from a two week break from his breathing being rough with allergies. He states it is a lot easier to breathe in the cooler months. He is taking all his medications as prescribed and checks his BP and O2 at home. Kennan is doing well in rehab. He feels his breathing is doing well since the cooler weather has came. He checks his oxygen  frequently and checks BP at home. He takes all his medications as prescribed. Kayson is doing well in rehab. He feels his breathing is doing well since the cooler weather has came. He checks his oxygen  frequently and checks BP at home. He takes all his medications as prescribed. He has been feeling bloated in the morning and we talked about following up with Pulm and redoing sleep study. He lost weight and CPAP may need to be readjusted due to in swolling air and it getting trapped.    Expected Outcomes Short: follow up with his GI doctor. Long: continue to wear his oxygen  devices as prescribed. Short: Continue to attend rehab. Long: Continue to wear his oxygen  devices as prescribed. Short: Continue to attend rehab. Long: Continue to wear his oxygen  devices as prescribed. Short: follow up with Pulm Dr.   darra: cotninue with CPAP and  taling medicaiton       Core Components/Risk Factors/Patient Goals at Discharge (Final Review):   Goals and Risk Factor Review -  09/21/24 1108       Core Components/Risk Factors/Patient Goals Review   Personal Goals Review Improve shortness of breath with ADL's;Develop more efficient breathing techniques such as purse lipped breathing and diaphragmatic breathing and practicing self-pacing with activity.;Increase knowledge of respiratory medications and ability to use respiratory devices properly.;Weight Management/Obesity    Review Amour is doing well in rehab. He feels his breathing is doing well since the cooler weather has came. He checks his oxygen  frequently and checks BP at home. He takes all his medications as prescribed. He has been feeling bloated in the morning and we talked about following up with Pulm and redoing sleep study. He lost weight and CPAP may need to be readjusted due to in swolling air and it getting trapped.    Expected Outcomes Short: follow up with Pulm Dr.   darra: cotninue with CPAP and taling medicaiton          ITP Comments:  ITP Comments     Row Name 05/11/24 1308 05/17/24 1029 05/19/24 0925 06/08/24 1037 06/16/24 1150   ITP Comments Completed virtual orientation today.  EP evaluation is scheduled for 05/17/24 at 1000 .  Documentation for diagnosis can be found in [facility] encounter 04/28/24. Patient attend orientation today.  Patient is attending Pulmonary Rehabilitation Program.  Documentation for diagnosis can be found in [facility].  Reviewed medical chart, RPE/RPD, gym safety, and program guidelines.  Patient was fitted to equipment they will be using during rehab.  Patient is scheduled to start exercise on 06/08/24.   Initial ITP created and sent for review and signature by Dr. Anton Kelp, Medical Director for Pulmonary Rehabilitation Program. 30 day review completed. ITP sent to Dr.Jehanzeb Memon, Medical Director of  Pulmonary Rehab. Continue with ITP unless changes are  made by physician.  New to program, only attended orientation thus far. First full day of exercise!  Patient was oriented to gym and equipment including functions, settings, policies, and procedures.  Patient's individual exercise prescription and treatment plan were reviewed.  All starting workloads were established based on the results of the 6 minute walk test done at initial orientation visit.  The plan for exercise progression was also introduced and progression will be customized based on patient's performance and goals. 30 day review completed. ITP sent to Dr.Jehanzeb Memon, Medical Director of  Pulmonary Rehab. Continue with ITP unless changes are made by physician.   Still new to program with start delayed to 06/08/24 and has only completed two exercise sessions thus far.    Row Name 07/14/24 1507 08/11/24 1527 09/08/24 0854 10/05/24 1049     ITP Comments 30 day review completed. ITP sent to Dr.Jehanzeb Memon, Medical Director of  Pulmonary Rehab. Continue with ITP unless changes are made by physician. 30 day review completed. ITP sent to Dr.Jehanzeb Memon, Medical Director of  Pulmonary Rehab. Continue with ITP unless changes are made by physician. 30 day review completed. ITP sent to Dr.Jehanzeb Memon, Medical Director of  Pulmonary Rehab. Continue with ITP unless changes are made by physician. 30 day review completed. ITP sent to Dr.Jehanzeb Memon, Medical Director of  Pulmonary Rehab. Continue with ITP unless changes are made by physician.       Comments: 30 day review

## 2024-10-05 NOTE — Progress Notes (Signed)
 Daily Session Note  Patient Details  Name: Tynan Boesel MRN: [id number] Date of Birth: [birth year] Referring Provider:   Flowsheet Row PULMONARY REHAB OTHER RESP ORIENTATION from 05/17/2024 in [facility] CARDIAC REHABILITATION  Referring Provider Theophilus Roosevelt MD    Encounter Date: 10/05/2024  Check In:  Session Check In - 10/05/24 1036       Check-In   Supervising physician immediately available to respond to emergencies See telemetry face sheet for immediately available MD    Location [facility]    Staff Present Powell Benders, BS, Exercise Physiologist;Brittany Jackquline, BSN, RN, WTA-C;Velmer Broadfoot Zina, RN    Virtual Visit No    Medication changes reported     No    Fall or balance concerns reported    No    Warm-up and Cool-down Performed on first and last piece of equipment    Resistance Training Performed Yes    VAD Patient? No    PAD/SET Patient? No      Pain Assessment   Currently in Pain? No/denies          Capillary Blood Glucose: No results found for this or any previous visit (from the past 24 hours).    Social History   Tobacco Use  Smoking Status Former   Current packs/day: 0.00   Average packs/day: 1 pack/day for 38.0 years (38.0 ttl pk-yrs)   Types: Cigarettes   Start date: 02/20/1982   Quit date: 02/21/2020   Years since quitting: 4.6  Smokeless Tobacco Never    Goals Met:  Proper associated with RPD/PD & O2 Sat Independence with exercise equipment Using PLB without cueing & demonstrates good technique Exercise tolerated well No report of concerns or symptoms today Strength training completed today  Goals Unmet:  Not Applicable  Comments: Pt able to follow exercise prescription today without complaint.  Will continue to monitor for progression.

## 2024-10-06 ENCOUNTER — Ambulatory Visit: Admitting: Gastroenterology

## 2024-10-06 ENCOUNTER — Encounter: Payer: Self-pay | Admitting: Gastroenterology

## 2024-10-06 VITALS — BP 138/88 | HR 79 | Temp 97.5°F | Ht 68.0 in | Wt 240.7 lb

## 2024-10-06 DIAGNOSIS — K219 Gastro-esophageal reflux disease without esophagitis: Secondary | ICD-10-CM | POA: Diagnosis not present

## 2024-10-06 DIAGNOSIS — K5900 Constipation, unspecified: Secondary | ICD-10-CM | POA: Diagnosis not present

## 2024-10-06 MED ORDER — LINACLOTIDE 72 MCG PO CAPS: 72.0000 ug | ORAL_CAPSULE | Freq: Every day | ORAL | Status: AC

## 2024-10-06 MED ORDER — PANTOPRAZOLE SODIUM 40 MG PO TBEC: 40.0000 mg | DELAYED_RELEASE_TABLET | Freq: Every day | ORAL | 3 refills | Status: AC

## 2024-10-06 NOTE — Progress Notes (Signed)
 Gastroenterology Office Note     Primary Care Physician:  Tobie Suzzane POUR, MD  Primary Gastroenterologist: Dr. Shaaron    Chief Complaint   Chief Complaint  Patient presents with   New Patient (Initial Visit)    Patient here today due to have issues with gas and belching, and chest pressure on going for a while. Patient reports he has COPD and has a tightness in chest, once he belches the pressure is relieved. He denies any current issues with gerd. Had some issues with this when he was younger,but nothing recent. Patient says most of the time this happens after eating. He has occasional issues with constipation, which he takes nothing for.      History of Present Illness   Ricardo Burch is a 63 y.o. male presenting today with a history of COPD, going through pulmonary rehab and doing well, history of pulmonary edema, pulmonary hypertension, congestive heart failure,Chronic kidney disease, GERD, hypertension, sleep apnea on CPAP machine, history of adenomas and recent colonoscopy without polyps with surveillance due in 7 years, presenting today with gas, belching.   Used to be on oxygen  nasal cannula continuously but now going Pulm rehab and uses as needed. 3 liters as needed.  He denies any shortness of breath on exertion, any chest pain, any shortness of breath at rest.  He is in no distress today.  He notes a prior history of reflux in the past when he was much younger and would only take Tums as needed.  He has noticed recently that with eating, feels bloated, belching and then relieves.   Will put oxygen  on when he has more tightness in his chest but no improvement,  thinking maybe this is longer related.  He will then will burp then feels better. Cpap at night, wondered if he trapping air.   Throat clearing. Globus sensation. Will cough occasionally to clear throat. Hx of GERD symptoms in the past with sour taste. Used to take Tums. No PPI. No solid food dysphagia currently.   No N/V. No melena.   Intermittent constipation and having to sit prolonged time on toilet. Has taken MOM in the past, stool softener occasionally. Ozempic restarted in May 2025. Currently on hold.   +carbonated drinks, caffeine. Drinks tea mostly. +fried food. Loves hot sauce. +etoh on weekends socially.   He desires to limit any medications unless needed, and he would like to hold off on any invasive procedures right now.  No prior EGD.   Colonoscopy July 2025: Pancolonic diverticulosis.  Otherwise normal.  Surveillance colonoscopy in 7 years.  August 2018 with diverticulosis, two 4 to 6 mm polyps resected and retrieved.  Pathology with tubular adenoma x 1 and benign colonic mucosa.  Recommended 5-year surveillance.    Past Medical History:  Diagnosis Date   Allergy    CHF (congestive heart failure) (HCC)    HFpEF   Chronic kidney disease    COPD (chronic obstructive pulmonary disease) (HCC) Dx 2015   Depression    Eczema    since childhood    Emphysema of lung (HCC)    Erectile dysfunction 08/18/2019   GERD (gastroesophageal reflux disease) 01/25/2020   Hyperlipidemia 01/12/2018   Hypertension    Oxygen  deficiency    Screening for HIV (human immunodeficiency virus)    Sleep apnea    CPAP   Substance abuse (HCC)    MJ, cocaine   Testicular failure 07/22/2019    Past Surgical History:  Procedure  Laterality Date   COLONOSCOPY N/A 05/12/2024   Procedure: COLONOSCOPY;  Surgeon: Shaaron Lamar HERO, MD;  Location: [facility];  Service: Endoscopy;  Laterality: N/A;  9:45 am, asa 3   COLONOSCOPY WITH PROPOFOL  N/A 06/25/2017   Surgeon: Shaaron Lamar HERO, MD;  diverticulosis, two 4 to 6 mm polyps resected and retrieved.  Pathology with tubular adenoma x 1 and benign colonic mucosa.  Recommended 5-year surveillance.   MULTIPLE EXTRACTIONS WITH ALVEOLOPLASTY N/A 03/22/2016   Procedure: MULTIPLE EXTRACTION WITH ALVEOLOPLASTY;  Surgeon: Glendia Primrose, DDS;  Location: [facility] OR;  Service: Oral  Surgery;  Laterality: N/A;   NO PAST SURGERIES     POLYPECTOMY  06/25/2017   Procedure: POLYPECTOMY;  Surgeon: Shaaron Lamar HERO, MD;  Location: [facility];  Service: Endoscopy;;  colon    Current Outpatient Medications  Medication Sig Dispense Refill   albuterol  (VENTOLIN  HFA) 108 (90 Base) MCG/ACT inhaler INHALE 1-2 PUFFS BY MOUTH EVERY 6 HOURS AS NEEDED FOR WHEEZING AND SHORTNESS OF BREATH 8.5 g 11   amLODipine  (NORVASC ) 5 MG tablet TAKE 1 TABLET BY MOUTH DAILY 30 tablet 11   fluticasone  (FLONASE ) 50 MCG/ACT nasal spray INSTILL TWO (2) SPRAYS IN EACH NOSTRIL TWICE DAILY 16 g 11   Fluticasone -Umeclidin-Vilant (TRELEGY ELLIPTA ) 200-62.5-25 MCG/ACT AEPB INHALE 1 PUFF BY MOUTH DAILY 60 each 9   linaclotide (LINZESS) 72 MCG capsule Take 1 capsule (72 mcg total) by mouth daily before breakfast.     OXYGEN  Inhale 3 L into the lungs daily. continuous     OZEMPIC, 1 MG/DOSE, 4 MG/3ML SOPN SMARTSIG:0.75 Milliliter(s) SUB-Q Once a Week     pantoprazole  (PROTONIX ) 40 MG tablet Take 1 tablet (40 mg total) by mouth daily. 30 minutes before breakfast for reflux 90 tablet 3   roflumilast  (DALIRESP ) 500 MCG TABS tablet TAKE 1 TABLET BY MOUTH DAILY 30 tablet 11   sildenafil  (VIAGRA ) 50 MG tablet Take 1 tablet (50 mg total) by mouth daily as needed for erectile dysfunction. 30 tablet 1   spironolactone (ALDACTONE) 25 MG tablet Take 25 mg by mouth daily.     No current facility-administered medications for this visit.    Allergies as of 10/06/2024 - Review Complete 10/06/2024  Allergen Reaction Noted   Apresoline  [hydralazine ] Other (See Comments) 07/10/2016    Family History  Problem Relation Age of Onset   Arthritis Mother    Hypertension Mother    Stroke Mother        age 49   Cerebral aneurysm Father    Alcohol abuse Father    Cancer Brother    Diabetes Neg Hx    Heart disease Neg Hx    Colon cancer Neg Hx     Social History   Socioeconomic History   Marital status: Single    Spouse  name: Not on file   Number of children: 5   Years of education: 12   Highest education level: 12th grade  Occupational History   Occupation: Unemployed     Comment: car wash details  Tobacco Use   Smoking status: Former    Current packs/day: 0.00    Average packs/day: 1 pack/day for 38.0 years (38.0 ttl pk-yrs)    Types: Cigarettes    Start date: 02/20/1982    Quit date: 02/21/2020    Years since quitting: 4.6   Smokeless tobacco: Never  Vaping Use   Vaping status: Never Used  Substance and Sexual Activity   Alcohol use: Yes    Alcohol/week: 1.0  standard drink of alcohol    Types: 1 Cans of beer per week    Comment: Socially x 1/month currently (as of 01/25/20); previously: occassionally: weekends, 6-pack or less on a day; or half pint of liquior   Drug use: Not Currently    Types: Cocaine, Marijuana    Comment: 01/25/20-marijuana few weeks ago, no cocaine (4-6 yeats ago)   Sexual activity: Never  Other Topics Concern   Not on file  Social History Narrative    Lives alone.    5 daughters 65, 84 yo twins, eldest 2 are married live in [place]. Retired.Girlfriend works in Programmer, [employer].   Social Drivers of Health   Financial Resource Strain: Medium Risk (06/14/2024)   Overall Financial Resource Strain (CARDIA)    Difficulty of Paying Living Expenses: Somewhat hard  Food Insecurity: Food Insecurity Present (06/14/2024)   Hunger Vital Sign    Worried About Running Out of Food in the Last Year: Sometimes true    Ran Out of Food in the Last Year: Sometimes true  Transportation Needs: No Transportation Needs (06/14/2024)   PRAPARE - Administrator, Civil Service (Medical): No    Lack of Transportation (Non-Medical): No  Physical Activity: Sufficiently Active (06/14/2024)   Exercise Vital Sign    Days of Exercise per Week: 5 days    Minutes of Exercise per Session: 100 min  Stress: No Stress Concern Present (06/14/2024)   [employer] of Occupational Health -  Occupational Stress Questionnaire    Feeling of Stress: Not at all  Social Connections: Unknown (06/14/2024)   Social Connection and Isolation Panel    Frequency of Communication with Friends and Family: More than three times a week    Frequency of Social Gatherings with Friends and Family: Twice a week    Attends Religious Services: More than 4 times per year    Active Member of [employer] or Organizations: No    Attends Banker Meetings: Not on file    Marital Status: Not on file  Intimate Partner Violence: Not At Risk (01/06/2024)   Humiliation, Afraid, Rape, and Kick questionnaire    Fear of Current or Ex-Partner: No    Emotionally Abused: No    Physically Abused: No    Sexually Abused: No     Review of Systems   Gen: Denies any fever, chills, fatigue, weight loss, lack of appetite.  CV: Denies chest pain, heart palpitations, peripheral edema, syncope.  Resp: Denies shortness of breath at rest or with exertion. Denies wheezing or cough.  GI: Denies dysphagia or odynophagia. Denies jaundice, hematemesis, fecal incontinence. GU : Denies urinary burning, urinary frequency, urinary hesitancy MS: Denies joint pain, muscle weakness, cramps, or limitation of movement.  Derm: Denies rash, itching, dry skin Psych: Denies depression, anxiety, memory loss, and confusion Heme: Denies bruising, bleeding, and enlarged lymph nodes.   Physical Exam   BP 138/88 (BP Location: Left Arm, Patient Position: Sitting, Cuff Size: Large)   Pulse 79   Temp (!) 97.5 F (36.4 C) (Temporal)   Ht 5' 8 (1.727 m)   Wt 240 lb 11.2 oz (109.2 kg)   BMI 36.60 kg/m  General:   Alert and oriented. Pleasant and cooperative. Well-nourished and well-developed.  Head:  Normocephalic and atraumatic. Eyes:  Without icterus Abdomen:  +BS, soft, round but not distended, nontender.  No obvious hepatosplenomegaly.   Rectal:  Deferred  Msk:  Symmetrical without gross deformities. Normal posture. Extremities:   Without edema. Neurologic:  Alert and  oriented x4;  grossly normal neurologically. Skin:  Intact without significant lesions or rashes. Psych:  Alert and cooperative. Normal mood and affect.   Assessment   ~GERD suspected and manifested without typical heartburn symptoms, noting more LPR symptoms, throat clearing, globus sensation, relief with belching.  ~Chronic constipation without alarm signs symptoms.   PLAN    Will start pantoprazole  once daily 30 minutes for breakfast.  We also discussed dietary changes as he endorses many trigger foods and diet is likely exacerbating symptoms.  I also recommended an upper endoscopy at this time due to newer onset and globus sensation, but he would like to avoid this for right now.  We discussed the signs and symptoms that would be important to prompt further evaluation.  As he wants to avoid invasive procedures we will start with a PPI daily, diet behavior modifications, and return in 3 months.  2.  Start Benefiber daily.  I have also given him Linzess 72 mcg that he can trial if Benefiber is not helpful.  He seems to have just mild constipation and may do well just with increased fiber supplementation.  3.  Return in 3 months or sooner if needed.   Therisa MICAEL Stager, PhD, ANP-BC [facility]

## 2024-10-06 NOTE — Patient Instructions (Signed)
 You are likely having symptoms related to reflux.  I have sent in pantoprazole  to take first thing in the morning, 30 minutes before breakfast.  This medicine is best absorbed on an empty stomach to have full effect.  I have also included a handout on good diet choices and behavior changes.  If you continue to have significant symptoms or any alarm symptoms, I recommend an upper endoscopy.  For mild constipation, I feel like you would benefit from taking supplemental fiber daily.  I like Benefiber to teaspoons each morning mixed in the beverage of your choice.  This is tasteless, colorless, odorless, and it can help you have more productive bowel movements and less constipation.  However, if this is not helpful, I have given you samples of Linzess at the lowest dosage.  Linzess is taken on an empty stomach, 30 minutes before breakfast.  There is usually a washout.  When you start this for the first 5 to 7 days, but it gets better.  There are higher doses if we need to.  Just keep the Linzess on hand in case you feel like the Benefiber is not helpful.  We will see you back in 3 months!  Please reach out on MyChart or call if you have any concerns, any problems swallowing, any abdominal pain, nausea, vomiting, blood in your stools, or any other questions.  Have a wonderful holiday season!  It was a pleasure to see you today. I want to create trusting relationships with patients and provide genuine, compassionate, and quality care. I truly value your feedback, so please be on the lookout for a survey regarding your visit with me today. I appreciate your time in completing this!         Therisa MICAEL Stager, PhD, ANP-BC [facility] Gastroenterology

## 2024-10-07 ENCOUNTER — Encounter (HOSPITAL_COMMUNITY)
Admission: RE | Admit: 2024-10-07 | Discharge: 2024-10-07 | Disposition: A | Source: Ambulatory Visit | Attending: Pulmonary Disease

## 2024-10-07 DIAGNOSIS — J439 Emphysema, unspecified: Secondary | ICD-10-CM

## 2024-10-07 DIAGNOSIS — J4489 Other specified chronic obstructive pulmonary disease: Secondary | ICD-10-CM | POA: Diagnosis not present

## 2024-10-07 NOTE — Progress Notes (Signed)
 Daily Session Note  Patient Details  Name: Ricardo Burch MRN: [id number] Date of Birth: [birth year] Referring Provider:   Flowsheet Row PULMONARY REHAB OTHER RESP ORIENTATION from 05/17/2024 in [facility] CARDIAC REHABILITATION  Referring Provider Theophilus Roosevelt MD    Encounter Date: 10/07/2024  Check In:  Session Check In - 10/07/24 1049       Check-In   Supervising physician immediately available to respond to emergencies See telemetry face sheet for immediately available MD    Location [facility]    Staff Present Powell Benders, BS, Exercise Physiologist;Jessica [place], MA, RCEP, CCRP, Sueellen Louder, RN, Engineer, Mining, RN    Virtual Visit No    Medication changes reported     No    Fall or balance concerns reported    No    Tobacco Cessation No Change    Warm-up and Cool-down Performed on first and last piece of equipment    Resistance Training Performed Yes    VAD Patient? No    PAD/SET Patient? No      Pain Assessment   Currently in Pain? No/denies    Pain Score 0-No pain    Multiple Pain Sites No          Capillary Blood Glucose: No results found for this or any previous visit (from the past 24 hours).    Social History   Tobacco Use  Smoking Status Former   Current packs/day: 0.00   Average packs/day: 1 pack/day for 38.0 years (38.0 ttl pk-yrs)   Types: Cigarettes   Start date: 02/20/1982   Quit date: 02/21/2020   Years since quitting: 4.6  Smokeless Tobacco Never    Goals Met:  Proper associated with RPD/PD & O2 Sat Independence with exercise equipment Using PLB without cueing & demonstrates good technique Exercise tolerated well Queuing for purse lip breathing No report of concerns or symptoms today Strength training completed today  Goals Unmet:  Not Applicable  Comments: SABRASABRAPt able to follow exercise prescription today without complaint.  Will continue to monitor for progression.

## 2024-10-12 ENCOUNTER — Encounter (HOSPITAL_COMMUNITY)
Admission: RE | Admit: 2024-10-12 | Discharge: 2024-10-12 | Disposition: A | Source: Ambulatory Visit | Attending: Pulmonary Disease

## 2024-10-12 DIAGNOSIS — J439 Emphysema, unspecified: Secondary | ICD-10-CM

## 2024-10-12 NOTE — Progress Notes (Deleted)
 Daily Session Note  Patient Details  Name: Hemi Chacko MRN: [id number] Date of Birth: 1961-07-28 Referring Provider:   Flowsheet Row PULMONARY REHAB OTHER RESP ORIENTATION from 05/17/2024 in [facility] CARDIAC REHABILITATION  Referring Provider Theophilus Roosevelt MD    Encounter Date: 10/12/2024  Check In:  Session Check In - 10/12/24 1030       Check-In   Supervising physician immediately available to respond to emergencies See telemetry face sheet for immediately available MD    Location [facility]    Staff Present Laymon Rattler, BSN, RN, WTA-C;Heather Con, BS, Exercise Physiologist;Jessica [place]ell, MA, RCEP, CCRP, CCET    Virtual Visit No    Medication changes reported     No    Fall or balance concerns reported    No    Tobacco Cessation No Change    Warm-up and Cool-down Performed on first and last piece of equipment    Resistance Training Performed Yes    VAD Patient? No    PAD/SET Patient? No      Pain Assessment   Currently in Pain? No/denies          Capillary Blood Glucose: No results found for this or any previous visit (from the past 24 hours).    Social History   Tobacco Use  Smoking Status Former   Current packs/day: 0.00   Average packs/day: 1 pack/day for 38.0 years (38.0 ttl pk-yrs)   Types: Cigarettes   Start date: 02/20/1982   Quit date: 02/21/2020   Years since quitting: 4.6  Smokeless Tobacco Never    Goals Met:  Proper associated with RPD/PD & O2 Sat Independence with exercise equipment Improved SOB with ADL's Using PLB without cueing & demonstrates good technique Exercise tolerated well No report of concerns or symptoms today Strength training completed today  Goals Unmet:  Not Applicable  Comments: Pt able to follow exercise prescription today without complaint.  Will continue to monitor for progression.

## 2024-10-12 NOTE — Progress Notes (Signed)
 Incomplete Session Note  Patient Details  Name: Ricardo Burch MRN: [id number] Date of Birth: [birth year] Referring Provider:   Flowsheet Row PULMONARY REHAB OTHER RESP ORIENTATION from 05/17/2024 in [facility] CARDIAC REHABILITATION  Referring Provider Theophilus Roosevelt MD    [name] did not complete his rehab session.  His wife called him and said she had a flat tire so he had to leave to go help her.

## 2024-10-14 ENCOUNTER — Encounter (HOSPITAL_COMMUNITY): Admission: RE | Admit: 2024-10-14 | Discharge: 2024-10-14 | Attending: Pulmonary Disease

## 2024-10-14 DIAGNOSIS — J439 Emphysema, unspecified: Secondary | ICD-10-CM

## 2024-10-14 DIAGNOSIS — J4489 Other specified chronic obstructive pulmonary disease: Secondary | ICD-10-CM | POA: Diagnosis not present

## 2024-10-14 NOTE — Progress Notes (Signed)
 Daily Session Note  Patient Details  Name: Ricardo Burch MRN: [id number] Date of Birth: 26-Feb-1961 Referring Provider:   Flowsheet Row PULMONARY REHAB OTHER RESP ORIENTATION from 05/17/2024 in [facility] CARDIAC REHABILITATION  Referring Provider Theophilus Roosevelt MD    Encounter Date: 10/14/2024  Check In:  Session Check In - 10/14/24 1015       Check-In   Supervising physician immediately available to respond to emergencies See telemetry face sheet for immediately available MD    Location [facility]    Staff Present Rolland Sake BSN, RN;Erle Guster Vicci, RN, BSN;Heather Con, BS, Exercise Physiologist    Virtual Visit No    Medication changes reported     No    Fall or balance concerns reported    No    Warm-up and Cool-down Performed on first and last piece of equipment    Resistance Training Performed Yes    VAD Patient? No    PAD/SET Patient? No      Pain Assessment   Currently in Pain? No/denies    Pain Score 0-No pain    Multiple Pain Sites No          Capillary Blood Glucose: No results found for this or any previous visit (from the past 24 hours).    Tobacco Use History[1]  Goals Met:  Proper associated with RPD/PD & O2 Sat Independence with exercise equipment Using PLB without cueing & demonstrates good technique Exercise tolerated well No report of concerns or symptoms today Strength training completed today  Goals Unmet:  Not Applicable  Comments: Pt able to follow exercise prescription today without complaint.  Will continue to monitor for progression.        [1]  Social History Tobacco Use  Smoking Status Former   Current packs/day: 0.00   Average packs/day: 1 pack/day for 38.0 years (38.0 ttl pk-yrs)   Types: Cigarettes   Start date: 02/20/1982   Quit date: 02/21/2020   Years since quitting: 4.6  Smokeless Tobacco Never

## 2024-10-19 ENCOUNTER — Encounter (HOSPITAL_COMMUNITY): Admission: RE | Admit: 2024-10-19 | Discharge: 2024-10-19 | Attending: Pulmonary Disease

## 2024-10-19 DIAGNOSIS — J4489 Other specified chronic obstructive pulmonary disease: Secondary | ICD-10-CM | POA: Diagnosis not present

## 2024-10-19 NOTE — Progress Notes (Signed)
 Daily Session Note  Patient Details  Name: Ricardo Burch MRN: [id number] Date of Birth: 12/13/1960 Referring Provider:   Flowsheet Row PULMONARY REHAB OTHER RESP ORIENTATION from 05/17/2024 in [facility] CARDIAC REHABILITATION  Referring Provider Theophilus Roosevelt MD    Encounter Date: 10/19/2024  Check In:  Session Check In - 10/19/24 1028       Check-In   Supervising physician immediately available to respond to emergencies See telemetry face sheet for immediately available MD    Location [facility]    Staff Present Laymon Rattler, BSN, RN, WTA-C;Heather Con, BS, Exercise Physiologist    Virtual Visit No    Medication changes reported     No    Fall or balance concerns reported    No    Tobacco Cessation No Change    Warm-up and Cool-down Performed on first and last piece of equipment    Resistance Training Performed Yes    VAD Patient? No    PAD/SET Patient? No      Pain Assessment   Currently in Pain? No/denies          Capillary Blood Glucose: No results found for this or any previous visit (from the past 24 hours).    Tobacco Use History[1]  Goals Met:  Proper associated with RPD/PD & O2 Sat Independence with exercise equipment Improved SOB with ADL's Using PLB without cueing & demonstrates good technique Exercise tolerated well No report of concerns or symptoms today Strength training completed today  Goals Unmet:  Not Applicable  Comments: Pt able to follow exercise prescription today without complaint.  Will continue to monitor for progression.        [1]  Social History Tobacco Use  Smoking Status Former   Current packs/day: 0.00   Average packs/day: 1 pack/day for 38.0 years (38.0 ttl pk-yrs)   Types: Cigarettes   Start date: 02/20/1982   Quit date: 02/21/2020   Years since quitting: 4.6  Smokeless Tobacco Never

## 2024-10-21 ENCOUNTER — Encounter (HOSPITAL_COMMUNITY)

## 2024-10-26 ENCOUNTER — Encounter (HOSPITAL_COMMUNITY)
Admission: RE | Admit: 2024-10-26 | Discharge: 2024-10-26 | Disposition: A | Discharge status: Home / Self Care | Source: Ambulatory Visit | Attending: Pulmonary Disease | Admitting: Pulmonary Disease

## 2024-10-26 DIAGNOSIS — J439 Emphysema, unspecified: Secondary | ICD-10-CM

## 2024-10-26 DIAGNOSIS — J4489 Other specified chronic obstructive pulmonary disease: Secondary | ICD-10-CM | POA: Diagnosis not present

## 2024-10-26 NOTE — Progress Notes (Signed)
 Daily Session Note  Patient Details  Name: Ricardo Burch MRN: [id number] Date of Birth: Oct 21, 1961 Referring Provider:   Flowsheet Row PULMONARY REHAB OTHER RESP ORIENTATION from 05/17/2024 in [facility] CARDIAC REHABILITATION  Referring Provider Theophilus Roosevelt MD    Encounter Date: 10/26/2024  Check In:  Session Check In - 10/26/24 1015       Check-In   Supervising physician immediately available to respond to emergencies See telemetry face sheet for immediately available MD    Location [facility]    Staff Present Adrien Louder, RN, BSN;Jessica Vonzell, MA, RCEP, CCRP, CCET;Heather Con, [place], Exercise Physiologist    Virtual Visit No    Medication changes reported     No    Fall or balance concerns reported    No    Warm-up and Cool-down Performed on first and last piece of equipment    Resistance Training Performed Yes    VAD Patient? No    PAD/SET Patient? No      Pain Assessment   Currently in Pain? No/denies    Pain Score 0-No pain    Multiple Pain Sites No          Capillary Blood Glucose: No results found for this or any previous visit (from the past 24 hours).    Tobacco Use History[1]  Goals Met:  Proper associated with RPD/PD & O2 Sat Independence with exercise equipment Using PLB without cueing & demonstrates good technique Exercise tolerated well No report of concerns or symptoms today Strength training completed today  Goals Unmet:  Not Applicable  Comments: Pt able to follow exercise prescription today without complaint.  Will continue to monitor for progression.        [1]  Social History Tobacco Use  Smoking Status Former   Current packs/day: 0.00   Average packs/day: 1 pack/day for 38.0 years (38.0 ttl pk-yrs)   Types: Cigarettes   Start date: 02/20/1982   Quit date: 02/21/2020   Years since quitting: 4.6  Smokeless Tobacco Never

## 2024-11-02 ENCOUNTER — Encounter (HOSPITAL_COMMUNITY)
Admission: RE | Admit: 2024-11-02 | Discharge: 2024-11-02 | Disposition: A | Discharge status: Home / Self Care | Source: Ambulatory Visit | Attending: Pulmonary Disease | Admitting: Pulmonary Disease

## 2024-11-02 ENCOUNTER — Encounter (HOSPITAL_COMMUNITY): Payer: Self-pay | Admitting: *Deleted

## 2024-11-02 DIAGNOSIS — J439 Emphysema, unspecified: Secondary | ICD-10-CM

## 2024-11-02 DIAGNOSIS — J4489 Other specified chronic obstructive pulmonary disease: Secondary | ICD-10-CM | POA: Diagnosis not present

## 2024-11-02 NOTE — Progress Notes (Signed)
 Pulmonary Individual Treatment Plan  Patient Details  Name: Ricardo Burch MRN: [id number] Date of Birth: 1961-06-20 Referring Provider:   Flowsheet Row PULMONARY REHAB OTHER RESP ORIENTATION from 05/17/2024 in [facility] CARD[facility]ATION  Referring Provider Theophilus Roosevelt MD    Initial Encounter Date:  Flowsheet Row PULMONARY REHAB OTHER RESP ORIENTATION from 05/17/2024 in [facility]ARDIAC REH[facility]ATION  Date 05/17/24    Visit Diagnosis: COPD with chronic bronchitis and emphysema (HCC)  Patient's Home Medications on Admission:  Current Medications[1]  Past Medical History: Past Medical History:  Diagnosis Date   Allergy    CHF (congestive heart failure) (HCC)    HFpEF   Chronic kidney disease    COPD (chronic obstructive pulmonary disease) (HCC) Dx 2015   Depression    Eczema    since childhood    Emphysema of lung (HCC)    Erectile dysfunction 08/18/2019   GERD (gastroesophageal reflux disease) 01/25/2020   Hyperlipidemia 01/12/2018   Hypertension    Oxygen  deficiency    Screening for HIV (human immunodeficiency virus)    Sleep apnea    CPAP   Substance abuse (HCC)    MJ, cocaine   Testicular failure 07/22/2019    Tobacco Use: Tobacco Use History[2]  Labs: Review Flowsheet  More data exists      Latest Ref Rng & Units 10/25/2020 05/16/2022 11/20/2022 05/19/2023 01/30/2024  Labs for ITP Cardiac and Pulmonary Rehab  Cholestrol 100 - 199 mg/dL 842  843  839  849  837   LDL (calc) 0 - 99 mg/dL 86  94  99  87  85   HDL-C >39 mg/dL 56  51  47  52  65   Trlycerides 0 - 149 mg/dL 64  52  70  49  60   Hemoglobin A1c 4.8 - 5.6 % 5.8  5.7  5.6  5.7  5.6     Capillary Blood Glucose: Lab Results  Component Value Date   GLUCAP 85 05/12/2024   GLUCAP 84 11/20/2019   GLUCAP 93 05/12/2018   GLUCAP 179 (H) 07/27/2016   GLUCAP 156 (H) 07/27/2016     Pulmonary Assessment Scores:  Pulmonary Assessment Scores     Row Name 05/11/24 1319         ADL UCSD    ADL Phase Entry     SOB Score total 39     Rest 0     Walk 3     Stairs 3     Bath 2     Dress 2     Shop 2       CAT Score   CAT Score 14       UCSD: Self-administered rating of dyspnea associated with activities of daily living (ADLs) 6-point scale (0 = not at all to 5 = maximal or unable to do because of breathlessness)  Scoring Scores range from 0 to 120.  Minimally important difference is 5 units  CAT: CAT can identify the health impairment of COPD patients and is better correlated with disease progression.  CAT has a scoring range of zero to 40. The CAT score is classified into four groups of low (less than 10), medium (10 - 20), high (21-30) and very high (31-40) based on the impact level of disease on health status. A CAT score over 10 suggests significant symptoms.  A worsening CAT score could be explained by an exacerbation, poor medication adherence, poor inhaler technique, or progression of COPD or comorbid conditions.  CAT  MCID is 2 points  mMRC: mMRC (Modified Medical Research Council) Dyspnea Scale is used to assess the degree of baseline functional disability in patients of respiratory disease due to dyspnea. No minimal important difference is established. A decrease in score of 1 point or greater is considered a positive change.   Pulmonary Function Assessment:  Pulmonary Function Assessment - 05/11/24 1311       Pulmonary Function Tests   FVC% 56 %    FEV1% 37 %    FEV1/FVC Ratio 61    DLCO% 50 %          Exercise Target Goals: Exercise Program Goal: Individual exercise prescription set using results from initial 6 min walk test and THRR while considering  patients activity barriers and safety.   Exercise Prescription Goal: Initial exercise prescription builds to 30-45 minutes a day of aerobic activity, 2-3 days per week.  Home exercise guidelines will be given to patient during program as part of exercise prescription that the participant will  acknowledge.  Activity Barriers & Risk Stratification:  Activity Barriers & Cardiac Risk Stratification - 05/11/24 1304       Activity Barriers & Cardiac Risk Stratification   Activity Barriers Muscular Weakness;Deconditioning;Shortness of Breath          6 Minute Walk:  6 Minute Walk     Row Name 05/17/24 1052         6 Minute Walk   Phase Initial     Distance 570 feet     Walk Time 3.46 minutes     # of Rest Breaks 0     MPH 1.87     METS 1.8     RPE 13     Perceived Dyspnea  3     VO2 Peak 6.3     Symptoms Yes (comment)     Comments pt o2 dropped 74 at 3.46 min test stopped     Resting HR 74 bpm     Resting BP 112/78     Resting Oxygen  Saturation  90 %     Exercise Oxygen  Saturation  during 6 min walk 74 %     Max Ex. HR 93 bpm     Max Ex. BP 150/70     2 Minute Post BP 126/70       Interval HR   1 Minute HR 91     2 Minute HR 87     3 Minute HR 93     6 Minute HR 74     2 Minute Post HR 71     Interval Heart Rate? Yes       Interval Oxygen    Interval Oxygen ? Yes     Baseline Oxygen  Saturation % 90 %     1 Minute Oxygen  Saturation % 90 %     1 Minute Liters of Oxygen  0 L     2 Minute Oxygen  Saturation % 86 %     2 Minute Liters of Oxygen  0 L     3 Minute Oxygen  Saturation % 74 %     3 Minute Liters of Oxygen  0 L     6 Minute Oxygen  Saturation % 87 %     6 Minute Liters of Oxygen  0 L     2 Minute Post Oxygen  Saturation % 88 %     2 Minute Post Liters of Oxygen  0 L        Oxygen  Initial Assessment:  Oxygen  Initial Assessment - 05/11/24 1310  Home Oxygen    Home Oxygen  Device Portable Concentrator;Home Concentrator    Sleep Oxygen  Prescription CPAP    Liters per minute 3    Home Exercise Oxygen  Prescription Continuous    Liters per minute 3    Home Resting Oxygen  Prescription Continuous    Liters per minute 3    Compliance with Home Oxygen  Use Yes      Intervention   Short Term Goals To learn and exhibit compliance with exercise, home  and travel O2 prescription;To learn and understand importance of monitoring SPO2 with pulse oximeter and demonstrate accurate use of the pulse oximeter.;To learn and understand importance of maintaining oxygen  saturations>88%;To learn and demonstrate proper pursed lip breathing techniques or other breathing techniques. ;To learn and demonstrate proper use of respiratory medications    Long  Term Goals Exhibits compliance with exercise, home  and travel O2 prescription;Verbalizes importance of monitoring SPO2 with pulse oximeter and return demonstration;Maintenance of O2 saturations>88%;Exhibits proper breathing techniques, such as pursed lip breathing or other method taught during program session;Compliance with respiratory medication;Demonstrates proper use of MDIs          Oxygen  Re-Evaluation:  Oxygen  Re-Evaluation     Row Name 06/08/24 1038 07/01/24 1200 08/10/24 1100 08/24/24 1045 09/21/24 1110     Program Oxygen  Prescription   Program Oxygen  Prescription -- Continuous Continuous Continuous Continuous   Liters per minute -- 3 3 3 3      Home Oxygen    Home Oxygen  Device -- Portable Concentrator;Home Concentrator;E-Tanks Portable Concentrator;Home Concentrator;E-Tanks Portable Concentrator;Home Concentrator;E-Tanks Portable Concentrator;Home Concentrator;E-Tanks   Sleep Oxygen  Prescription -- Continuous;CPAP Continuous;CPAP Continuous;CPAP Continuous;CPAP   Liters per minute -- 3 3 3 3    Home Exercise Oxygen  Prescription -- Continuous Continuous Continuous Continuous   Liters per minute -- 3 3 -- 3   Home Resting Oxygen  Prescription -- Continuous Continuous -- Continuous   Liters per minute -- 3 3 3 3    Compliance with Home Oxygen  Use -- -- Yes Yes Yes     Goals/Expected Outcomes   Short Term Goals -- To learn and exhibit compliance with exercise, home and travel O2 prescription;To learn and understand importance of monitoring SPO2 with pulse oximeter and demonstrate accurate use of the  pulse oximeter.;To learn and understand importance of maintaining oxygen  saturations>88%;To learn and demonstrate proper pursed lip breathing techniques or other breathing techniques. ;To learn and demonstrate proper use of respiratory medications To learn and exhibit compliance with exercise, home and travel O2 prescription;To learn and understand importance of monitoring SPO2 with pulse oximeter and demonstrate accurate use of the pulse oximeter.;To learn and understand importance of maintaining oxygen  saturations>88%;To learn and demonstrate proper pursed lip breathing techniques or other breathing techniques. ;To learn and demonstrate proper use of respiratory medications To learn and exhibit compliance with exercise, home and travel O2 prescription;To learn and understand importance of monitoring SPO2 with pulse oximeter and demonstrate accurate use of the pulse oximeter.;To learn and understand importance of maintaining oxygen  saturations>88%;To learn and demonstrate proper pursed lip breathing techniques or other breathing techniques. ;To learn and demonstrate proper use of respiratory medications To learn and exhibit compliance with exercise, home and travel O2 prescription;To learn and understand importance of monitoring SPO2 with pulse oximeter and demonstrate accurate use of the pulse oximeter.;To learn and understand importance of maintaining oxygen  saturations>88%;To learn and demonstrate proper pursed lip breathing techniques or other breathing techniques. ;To learn and demonstrate proper use of respiratory medications   Long  Term Goals -- Exhibits compliance with  exercise, home  and travel O2 prescription;Verbalizes importance of monitoring SPO2 with pulse oximeter and return demonstration;Exhibits proper breathing techniques, such as pursed lip breathing or other method taught during program session;Maintenance of O2 saturations>88%;Compliance with respiratory medication;Demonstrates proper use of  MDIs Exhibits compliance with exercise, home  and travel O2 prescription;Verbalizes importance of monitoring SPO2 with pulse oximeter and return demonstration;Exhibits proper breathing techniques, such as pursed lip breathing or other method taught during program session;Maintenance of O2 saturations>88%;Compliance with respiratory medication;Demonstrates proper use of MDIs Exhibits compliance with exercise, home  and travel O2 prescription;Verbalizes importance of monitoring SPO2 with pulse oximeter and return demonstration;Exhibits proper breathing techniques, such as pursed lip breathing or other method taught during program session;Maintenance of O2 saturations>88%;Compliance with respiratory medication;Demonstrates proper use of MDIs Exhibits compliance with exercise, home  and travel O2 prescription;Verbalizes importance of monitoring SPO2 with pulse oximeter and return demonstration;Exhibits proper breathing techniques, such as pursed lip breathing or other method taught during program session;Maintenance of O2 saturations>88%;Compliance with respiratory medication;Demonstrates proper use of MDIs   Comments Reviewed PLB technique with pt.  Talked about how it works and it's importance in maintaining their exercise saturations. Ulys continues to keep a check on his oxygen  saturations at home. He wears his cpap at bedtime. He notes he breathing is good here and at home, noting when he feels exerted he stops and does his PLB techniques. Terryon is doing well in rehab. He just came back from a two week break from his breathing being rough with allergies. He states it is a lot easier to breathe in the cooler months. When he feels SOB he uses his PLB techniques. Richardo is doing well in rehab. He feels his breathing is ok, it hasn't gotten any worse. The cooler months make is easier to breathe. He is compliant with his oxygen  and CPAP. He is going to ask his doctor about doing another sleep study to see if he can  get off his CPAP at night. Clois is doing well in rehab. He feels his breathing is good and it hasn't gotten any worse. He feels that cooler months make is easier to breathe. He is compliant with his oxygen  and CPAP. He is going to ask his doctor about doing another sleep study to see if his CPAP needs to be readjusted die to weight loss   Goals/Expected Outcomes Short: Become more profiecient at using PLB.   Long: Become independent at using PLB. Short: continue to attand rehab. Long: continue using PLB techniques when needed. Short: continue to attand rehab. Long: continue using PLB techniques when needed. Short: continue to attand rehab. Long: Speak with doctor about another sleep study. Short: follow up with Pulm Dr,   long: continue to use oxygen  and CPAP      Oxygen  Discharge (Final Oxygen  Re-Evaluation):  Oxygen  Re-Evaluation - 09/21/24 1110       Program Oxygen  Prescription   Program Oxygen  Prescription Continuous    Liters per minute 3      Home Oxygen    Home Oxygen  Device Portable Concentrator;Home Concentrator;E-Tanks    Sleep Oxygen  Prescription Continuous;CPAP    Liters per minute 3    Home Exercise Oxygen  Prescription Continuous    Liters per minute 3    Home Resting Oxygen  Prescription Continuous    Liters per minute 3    Compliance with Home Oxygen  Use Yes      Goals/Expected Outcomes   Short Term Goals To learn and exhibit compliance with exercise, home and  travel O2 prescription;To learn and understand importance of monitoring SPO2 with pulse oximeter and demonstrate accurate use of the pulse oximeter.;To learn and understand importance of maintaining oxygen  saturations>88%;To learn and demonstrate proper pursed lip breathing techniques or other breathing techniques. ;To learn and demonstrate proper use of respiratory medications    Long  Term Goals Exhibits compliance with exercise, home  and travel O2 prescription;Verbalizes importance of monitoring SPO2 with pulse  oximeter and return demonstration;Exhibits proper breathing techniques, such as pursed lip breathing or other method taught during program session;Maintenance of O2 saturations>88%;Compliance with respiratory medication;Demonstrates proper use of MDIs    Comments Chaunce is doing well in rehab. He feels his breathing is good and it hasn't gotten any worse. He feels that cooler months make is easier to breathe. He is compliant with his oxygen  and CPAP. He is going to ask his doctor about doing another sleep study to see if his CPAP needs to be readjusted die to weight loss    Goals/Expected Outcomes Short: follow up with Pulm Dr,   long: continue to use oxygen  and CPAP          Initial Exercise Prescription:  Initial Exercise Prescription - 05/17/24 1000       Date of Initial Exercise RX and Referring Provider   Date 05/17/24    Referring Provider Theophilus Roosevelt MD      Oxygen    Oxygen  Continuous    Liters 3    Maintain Oxygen  Saturation 88% or higher      Treadmill   MPH 1.2    Grade 0    Minutes 15    METs 1.92      REL-XR   Level 1    Speed 50    Minutes 15    METs 1.9      Prescription Details   Frequency (times per week) 2    Duration Progress to 30 minutes of continuous aerobic without signs/symptoms of physical distress      Intensity   THRR 40-80% of Max Heartrate 108-143    Ratings of Perceived Exertion 11-13    Perceived Dyspnea 0-4      Resistance Training   Training Prescription Yes    Weight 7    Reps 10-15          Perform Capillary Blood Glucose checks as needed.  Exercise Prescription Changes:   Exercise Prescription Changes     Row Name 05/17/24 1100 06/29/24 1500 07/29/24 1500 08/17/24 1500 09/21/24 1500     Response to Exercise   Blood Pressure (Admit) 112/78 105/70 122/62 122/70 126/62   Blood Pressure (Exercise) 150/70 134/76 -- -- --   Blood Pressure (Exit) 126/70 118/60 112/70 100/68 118/64   Heart Rate (Admit) 74 bpm 78 bpm 63 bpm  79 bpm 69 bpm   Heart Rate (Exercise) 93 bpm 100 bpm 84 bpm 92 bpm 82 bpm   Heart Rate (Exit) 71 bpm 77 bpm 73 bpm 85 bpm 77 bpm   Oxygen  Saturation (Admit) 90 % 91 % 90 % 90 % 90 %   Oxygen  Saturation (Exercise) 74 % 87 % 87 % 89 % 91 %   Oxygen  Saturation (Exit) 88 % 94 % 91 % 91 % 95 %   Rating of Perceived Exertion (Exercise) 13 12 11 12 11    Perceived Dyspnea (Exercise) 3 2 1 1 1    Duration Progress to 30 minutes of  aerobic without signs/symptoms of physical distress Continue with 30 min of aerobic exercise  without signs/symptoms of physical distress. Continue with 30 min of aerobic exercise without signs/symptoms of physical distress. Continue with 30 min of aerobic exercise without signs/symptoms of physical distress. Continue with 30 min of aerobic exercise without signs/symptoms of physical distress.   Intensity THRR unchanged THRR unchanged THRR unchanged THRR unchanged THRR unchanged     Progression   Progression -- Continue to progress workloads to maintain intensity without signs/symptoms of physical distress. Continue to progress workloads to maintain intensity without signs/symptoms of physical distress. Continue to progress workloads to maintain intensity without signs/symptoms of physical distress. Continue to progress workloads to maintain intensity without signs/symptoms of physical distress.     Resistance Training   Training Prescription -- Yes Yes Yes Yes   Weight -- 7 7 7 7    Reps -- 10-15 10-15 10-15 10-15     Oxygen    Oxygen  -- Continuous Continuous Continuous Continuous   Liters -- 4 4 3 3      Treadmill   MPH -- 1.7 1 1.4 1.4   Grade -- 0 0 0 0   Minutes -- 15 15 15 15    METs -- 2.3 1.77 2.07 2.07     NuStep   Level -- -- -- 4 4   SPM -- -- -- 95 90   Minutes -- -- -- 15 15   METs -- -- -- 2.7 2.1     REL-XR   Level -- 1 3 -- --   Speed -- 46 94 -- --   Minutes -- 15 15 -- --   METs -- 2.1 2.6 -- --     Oxygen    Maintain Oxygen  Saturation -- 88% or  higher 88% or higher 88% or higher 88% or higher    Row Name 10/07/24 1500             Response to Exercise   Blood Pressure (Admit) 120/64       Blood Pressure (Exit) 120/70       Heart Rate (Admit) 82 bpm       Heart Rate (Exercise) 100 bpm       Heart Rate (Exit) 80 bpm       Oxygen  Saturation (Admit) 90 %       Oxygen  Saturation (Exercise) 87 %       Oxygen  Saturation (Exit) 93 %       Rating of Perceived Exertion (Exercise) 13       Perceived Dyspnea (Exercise) 1       Duration Continue with 30 min of aerobic exercise without signs/symptoms of physical distress.       Intensity THRR unchanged         Progression   Progression Continue to progress workloads to maintain intensity without signs/symptoms of physical distress.         Resistance Training   Training Prescription Yes       Weight 7       Reps 10-15         Oxygen    Oxygen  Continuous       Liters 3         Treadmill   MPH 1.4       Grade 1       Minutes 15       METs 2.27         NuStep   Level 4       SPM 97       Minutes 15  METs 2.9         Oxygen    Maintain Oxygen  Saturation 88% or higher          Exercise Comments:   Exercise Comments     Row Name 05/11/24 1310 05/17/24 1029 06/08/24 1037       Exercise Comments Glenard currently doesn't do any home exercise besides ADL's and walking his dog. Patient attend orientation today.  Patient is attending Pulmonary Rehabilitation Program.  Documentation for diagnosis can be found in [facility].  Reviewed medical chart, RPE/RPD, gym safety, and program guidelines.  Patient was fitted to equipment they will be using during rehab.  Patient is scheduled to start exercise on 06/08/24.   Initial ITP created and sent for review and signature by Dr. Anton Kelp, Medical Director for Pulmonary Rehabilitation Program. First full day of exercise!  Patient was oriented to gym and equipment including functions, settings, policies, and procedures.  Patient's  individual exercise prescription and treatment plan were reviewed.  All starting workloads were established based on the results of the 6 minute walk test done at initial orientation visit.  The plan for exercise progression was also introduced and progression will be customized based on patient's performance and goals.        Exercise Goals and Review:   Exercise Goals     Row Name 05/11/24 1309             Exercise Goals   Increase Physical Activity Yes       Intervention Provide advice, education, support and counseling about physical activity/exercise needs.;Develop an individualized exercise prescription for aerobic and resistive training based on initial evaluation findings, risk stratification, comorbidities and participant's personal goals.       Expected Outcomes Short Term: Attend rehab on a regular basis to increase amount of physical activity.;Long Term: Add in home exercise to make exercise part of routine and to increase amount of physical activity.;Long Term: Exercising regularly at least 3-5 days a week.       Increase Strength and Stamina Yes       Intervention Provide advice, education, support and counseling about physical activity/exercise needs.;Develop an individualized exercise prescription for aerobic and resistive training based on initial evaluation findings, risk stratification, comorbidities and participant's personal goals.       Expected Outcomes Short Term: Increase workloads from initial exercise prescription for resistance, speed, and METs.;Short Term: Perform resistance training exercises routinely during rehab and add in resistance training at home;Long Term: Improve cardiorespiratory fitness, muscular endurance and strength as measured by increased METs and functional capacity ( )       Able to understand and use rate of perceived exertion (RPE) scale Yes       Intervention Provide education and explanation on how to use RPE scale       Expected Outcomes  Short Term: Able to use RPE daily in rehab to express subjective intensity level;Long Term:  Able to use RPE to guide intensity level when exercising independently       Able to understand and use Dyspnea scale Yes       Intervention Provide education and explanation on how to use Dyspnea scale       Expected Outcomes Short Term: Able to use Dyspnea scale daily in rehab to express subjective sense of shortness of breath during exertion;Long Term: Able to use Dyspnea scale to guide intensity level when exercising independently       Knowledge and understanding of Target Heart Rate Range (THRR)  Yes       Intervention Provide education and explanation of THRR including how the numbers were predicted and where they are located for reference       Expected Outcomes Short Term: Able to state/look up THRR;Short Term: Able to use daily as guideline for intensity in rehab;Long Term: Able to use THRR to govern intensity when exercising independently       Able to check pulse independently Yes       Intervention Provide education and demonstration on how to check pulse in carotid and radial arteries.;Review the importance of being able to check your own pulse for safety during independent exercise       Expected Outcomes Short Term: Able to explain why pulse checking is important during independent exercise;Long Term: Able to check pulse independently and accurately       Understanding of Exercise Prescription Yes       Intervention Provide education, explanation, and written materials on patient's individual exercise prescription       Expected Outcomes Short Term: Able to explain program exercise prescription;Long Term: Able to explain home exercise prescription to exercise independently          Exercise Goals Re-Evaluation :  Exercise Goals Re-Evaluation     Row Name 06/08/24 1037 07/01/24 0941 07/01/24 1155 08/03/24 0838 08/10/24 1101     Exercise Goal Re-Evaluation   Exercise Goals Review Able to  understand and use rate of perceived exertion (RPE) scale;Knowledge and understanding of Target Heart Rate Range (THRR) Increase Physical Activity;Increase Strength and Stamina;Understanding of Exercise Prescription Increase Physical Activity;Increase Strength and Stamina;Able to understand and use Dyspnea scale;Understanding of Exercise Prescription;Knowledge and understanding of Target Heart Rate Range (THRR);Able to understand and use rate of perceived exertion (RPE) scale Increase Physical Activity;Increase Strength and Stamina;Understanding of Exercise Prescription Increase Physical Activity;Increase Strength and Stamina;Able to understand and use Dyspnea scale;Knowledge and understanding of Target Heart Rate Range (THRR);Able to understand and use rate of perceived exertion (RPE) scale;Understanding of Exercise Prescription   Comments Reviewed RPE and dyspnea scale, THR and program prescription with pt today.  Pt voiced understanding and was given a copy of goals to take home. Wren has completed 7 sessions of rehab. He has been doing well with the program . He has had to increase his oxygen  to 4L during exercise due to excerting hisself. Will continue to montior and progress as able Adarius is doing great in rehab. He notes he doesn't really exercise at home but he does do lots of things around the house. Spoke with him about maybe starting to walk his street, even with his dog, also did tell him to make sure the weather was ok when he did so his breathing would be ok. Hobson has completed 13 sessions of PR. He has be increasing his level on the nustep and has high spm. Is is still walking at a speed of 1.0 MPH on the treadmill but does not need to take as many breaks. Will continue to moniotr and progress as able. Lerone is doing well in rehab. He is doing some walking at home. He is increasing his levels on the machines here at rehab when his oxygen  allows.   Expected Outcomes Short: Use RPE daily to regulate  intensity.  Long: Follow program prescription in THR. Short: contiiue to increase levels and speed on treadmill   long: continue to attend rehab Short: continue to attend rehab. Long: walk more at home. Short: continue to walk the treadmill  long: continue to atted rehab and exercise Short: Continue to attend rehab. Long: Increase exercise at home.    Row Name 08/18/24 1307 08/24/24 1044 09/21/24 1102 10/08/24 0841       Exercise Goal Re-Evaluation   Exercise Goals Review Increase Physical Activity;Increase Strength and Stamina;Understanding of Exercise Prescription Increase Physical Activity;Increase Strength and Stamina;Able to understand and use rate of perceived exertion (RPE) scale Increase Physical Activity;Increase Strength and Stamina;Understanding of Exercise Prescription Increase Physical Activity;Increase Strength and Stamina;Understanding of Exercise Prescription    Comments Chord has completed 16 sessions of PR. HE is walking on the treadmiill in class and has had good oxygen  levels WNL. Will continue to monitor and progress as abe Rishab is doing well in rehab. He is trying to increase his levels on the treadmill and Nustep, yet he has to take breaks because his breathing will get tough and O2 sat's will drop. He does some walking at home, yet states he could incorpoate more. Thurlow is doing well in rehab. He has noticed and increase in energy and feeling better since starting the program. He is walking at home and is finding an inside area to walk since the weater is getting colder Gurshaan has been doing great in rehab. He is on level 4 on the nustep and walking good on thr treadmill. Will continue to montior and progress as able    Expected Outcomes Short: continue to increase levels on the treadmill   long: conitnue to attend rehab Short: Continue to attend rehab. Long: Incorporate more exercise at home. Short: continue to attend rehab   long: contonue to exercise at home Short: continue to attend  rehab   long: contonue to exercise at home       Discharge Exercise Prescription (Final Exercise Prescription Changes):  Exercise Prescription Changes - 10/07/24 1500       Response to Exercise   Blood Pressure (Admit) 120/64    Blood Pressure (Exit) 120/70    Heart Rate (Admit) 82 bpm    Heart Rate (Exercise) 100 bpm    Heart Rate (Exit) 80 bpm    Oxygen  Saturation (Admit) 90 %    Oxygen  Saturation (Exercise) 87 %    Oxygen  Saturation (Exit) 93 %    Rating of Perceived Exertion (Exercise) 13    Perceived Dyspnea (Exercise) 1    Duration Continue with 30 min of aerobic exercise without signs/symptoms of physical distress.    Intensity THRR unchanged      Progression   Progression Continue to progress workloads to maintain intensity without signs/symptoms of physical distress.      Resistance Training   Training Prescription Yes    Weight 7    Reps 10-15      Oxygen    Oxygen  Continuous    Liters 3      Treadmill   MPH 1.4    Grade 1    Minutes 15    METs 2.27      NuStep   Level 4    SPM 97    Minutes 15    METs 2.9      Oxygen    Maintain Oxygen  Saturation 88% or higher          Nutrition:  Target Goals: Understanding of nutrition guidelines, daily intake of sodium 1500mg , cholesterol 200mg , calories 30% from fat and 7% or less from saturated fats, daily to have 5 or more servings of fruits and vegetables.  Biometrics:  Pre Biometrics - 05/17/24 1102  Pre Biometrics   Height 5' 9 (1.753 m)    Weight 238 lb 1.6 oz (108 kg)    Waist Circumference 47 inches    Hip Circumference 42 inches    Waist to Hip Ratio 1.12 %    BMI (Calculated) 35.14    Grip Strength 44.2 kg           Nutrition Therapy Plan and Nutrition Goals:  Nutrition Therapy & Goals - 05/11/24 1313       Intervention Plan   Intervention Prescribe, educate and counsel regarding individualized specific dietary modifications aiming towards targeted core components such as  weight, hypertension, lipid management, diabetes, heart failure and other comorbidities.;Nutrition handout(s) given to patient.    Expected Outcomes Short Term Goal: Understand basic principles of dietary content, such as calories, fat, sodium, cholesterol and nutrients.;Short Term Goal: A plan has been developed with personal nutrition goals set during dietitian appointment.;Long Term Goal: Adherence to prescribed nutrition plan.          Nutrition Assessments:  MEDIFICTS Score Key: >=70 Need to make dietary changes  40-70 Heart Healthy Diet <= 40 Therapeutic Level Cholesterol Diet  Flowsheet Row PULMONARY [facility]IRTUAL BASED CARE from 05/11/2024 in [facility]  Picture Your Plate Total Score on Admission 49   Picture Your Plate Scores: <59 Unhealthy dietary pattern with much room for improvement. 41-50 Dietary pattern unlikely to meet recommendations for good health and room for improvement. 51-60 More healthful dietary pattern, with some room for improvement.  >60 Healthy dietary pattern, although there may be some specific behaviors that could be improved.    Nutrition Goals Re-Evaluation:  Nutrition Goals Re-Evaluation     Row Name 07/01/24 1145 08/10/24 1057 08/24/24 1041 09/21/24 1105       Goals   Nutrition Goal Short: start to incorporate more fiber in his diet. Long: eat 3 well-balanced meals per day. Healthy eating Healthy eating Healthy eating    Comment Erskin notes he is not eating the best at home, he does not eat much and not very well-balanced meals, he does like to eat fish and chicken and salads. I talked with Faizaan about continuing fish and chicken for protein, and maybe switching his salads with iceberg lettuce to more dark-green leafy. Antron also complains of constipation, so I spoke with him about his fiber intake, I mentioned metamucial supplement and foods that are high in fiber. Nassir notes he is not eating the best at home, he does not eat much  and not very well-balanced meals, he does like to eat fish and chicken and salads. I talked with Trenten about continuing fish and chicken for protein, and maybe switching his salads with iceberg lettuce to more dark-green leafy. Kermitt also complains of constipation, so I spoke with him about his fiber intake, I mentioned metamucial supplement and foods that are high in fiber. Azarian states that he is still struggling with his diet a little bit. He mainly eats 2 meals a day and something Baucum for lunch. He does state that he cheats a little on the weekends. Encouraged him to limit cheat days, yet still have them in moderation. He feels he is drinking enough water throughout the day. Manolo continues to struggle a little with his diet. He is eating two full meals a day and a snack at night. He continues to have cheat days on the weekends. He has noticed that he has gained some weight and his doctor wants him to start back on  ozempic to help lose weight. He will start this after the holidays. He does drink a good amout of water during the day.    Expected Outcome Short: start to incorporate more fiber in his diet. Long: eat 3 well-balanced meals per day. Short: Continue to attend rehab. Long: Incorporate more fiber into his diet. Short: Continue to attend rehab. Long: Incorporate more healthy options into his diet. Short: start back with weightloss shot   long : continue to pick healtier options       Nutrition Goals Discharge (Final Nutrition Goals Re-Evaluation):  Nutrition Goals Re-Evaluation - 09/21/24 1105       Goals   Nutrition Goal Healthy eating    Comment Teren continues to struggle a little with his diet. He is eating two full meals a day and a snack at night. He continues to have cheat days on the weekends. He has noticed that he has gained some weight and his doctor wants him to start back on ozempic to help lose weight. He will start this after the holidays. He does drink a good amout of water during  the day.    Expected Outcome Short: start back with weightloss shot   long : continue to pick healtier options          Psychosocial: Target Goals: Acknowledge presence or absence of significant depression and/or stress, maximize coping skills, provide positive support system. Participant is able to verbalize types and ability to use techniques and skills needed for reducing stress and depression.  Initial Review & Psychosocial Screening:  Initial Psych Review & Screening - 05/11/24 1313       Initial Review   Current issues with None Identified      Family Dynamics   Good Support System? Yes    Comments Massimiliano didn't specify who his support system was, but he states he has people to help him if needed.      Barriers   Psychosocial barriers to participate in program There are no identifiable barriers or psychosocial needs.      Screening Interventions   Interventions Encouraged to exercise    Expected Outcomes Short Term goal: Identification and review with participant of any Quality of Life or Depression concerns found by scoring the questionnaire.;Long Term goal: The participant improves quality of Life and PHQ9 Scores as seen by post scores and/or verbalization of changes;Long Term Goal: Stressors or current issues are controlled or eliminated.;Short Term goal: Utilizing psychosocial counselor, staff and physician to assist with identification of specific Stressors or current issues interfering with healing process. Setting desired goal for each stressor or current issue identified.          Quality of Life Scores:  Scores of 19 and below usually indicate a poorer quality of life in these areas.  A difference of  2-3 points is a clinically meaningful difference.  A difference of 2-3 points in the total score of the Quality of Life Index has been associated with significant improvement in overall quality of life, self-image, physical symptoms, and general health in studies assessing  change in quality of life.   PHQ-9: Review Flowsheet  More data exists      07/23/2024 06/18/2024 05/17/2024 02/11/2024 01/20/2024  Depression screen PHQ 2/9  Decreased Interest 0 0 0 1 0 0  Down, Depressed, Hopeless 0 0 0 1 0 0  PHQ - 2 Score 0 0 0 2 0 0  Altered sleeping 0 0 0 1 0 0  Tired, decreased energy  0 0 1 2 0 0  Change in appetite 0 0 0 0 0 0  Feeling bad or failure about yourself  0 0 0 1 0 0  Trouble concentrating 0 0 0 0 0 0  Moving slowly or fidgety/restless 0 0 1 2 0 0  Suicidal thoughts 0 0 0 0 0 0  PHQ-9 Score 0  0  2  8  0  0   Difficult doing work/chores Not difficult at all Not difficult at all Not difficult at all Somewhat difficult Not difficult at all Not difficult at all    Details       Data saved with a previous flowsheet row definition   Multiple values from one day are sorted in reverse-chronological order        Interpretation of Total Score  Total Score Depression Severity:  1-4 = Minimal depression, 5-9 = Mild depression, 10-14 = Moderate depression, 15-19 = Moderately severe depression, 20-27 = Severe depression   Psychosocial Evaluation and Intervention:  Psychosocial Evaluation - 05/11/24 1314       Psychosocial Evaluation & Interventions   Interventions Encouraged to exercise with the program and follow exercise prescription    Comments Jamas is a pleasant gentleman who is coming into rehab for COPD with chronic bronchitis and emphysema. He has been in the program before but it was for cardiac reasoning.  Haskel states  that he gets around well and is independent with his ADL's. He does not currently do any home exercise besides ADL's and walking his dog outside. He is on 3L oxygen  as needed, and a CPAP at night with 3L. He is having a colonscopy tomorrow and is not very happy about that, but he is eager to start back our program starting Monday the 14th.    Expected Outcomes Short: Increase strength and stamina. Long: Get off oxygen  or be able to  wean down eventually.    Continue Psychosocial Services  Follow up required by staff          Psychosocial Re-Evaluation:  Psychosocial Re-Evaluation     Row Name 07/01/24 1141 08/10/24 1056 08/24/24 1040 09/21/24 1104       Psychosocial Re-Evaluation   Current issues with None Identified Current Stress Concerns None Identified None Identified    Comments Kashis is doing great in rehab. Jamorris notes that he is staying busy at home, and going out and doing things with his wife. Ronnie is enjoying life and coping well. Parish is doing well in rehab. He is just now coming back from about a 2 week break. His breathing was a little rough from allergies, yet he sais he has had a lot going on as one of his twin daughters is engaged and had an engagement party. His stress is mainly happy stress regarding his daughters engagement. Trisha is doing well in rehab. He currently identifies no stressors or sleep issues at the moment. Kaid is doing well in rehab. He currently identifies no stressors. He has noticed in the morning her feels bloated and was wondering if it was from the CPAP. He has lost weight and might need the CPAP adjusted    Expected Outcomes Short: continue to attend rehab. Long: continue to stay active in activities that make him happy. Short: continue to attend rehab. Long: Enjoy the engagement of his daughter. Short: Continue to attend rehab. Long: Continue to have healthy stress outlets. Short: continue pulm about CPAP    long: continue to have healthy stress  outlet    Interventions Encouraged to attend Pulmonary Rehabilitation for the exercise Encouraged to attend Pulmonary Rehabilitation for the exercise;Stress management education Encouraged to attend Pulmonary Rehabilitation for the exercise Encouraged to attend Pulmonary Rehabilitation for the exercise    Continue Psychosocial Services  Follow up required by staff Follow up required by staff Follow up required by staff Follow up required by  staff       Psychosocial Discharge (Final Psychosocial Re-Evaluation):  Psychosocial Re-Evaluation - 09/21/24 1104       Psychosocial Re-Evaluation   Current issues with None Identified    Comments Pratham is doing well in rehab. He currently identifies no stressors. He has noticed in the morning her feels bloated and was wondering if it was from the CPAP. He has lost weight and might need the CPAP adjusted    Expected Outcomes Short: continue pulm about CPAP    long: continue to have healthy stress outlet    Interventions Encouraged to attend Pulmonary Rehabilitation for the exercise    Continue Psychosocial Services  Follow up required by staff           Education: Education Goals: Education classes will be provided on a weekly basis, covering required topics. Participant will state understanding/return demonstration of topics presented.  Learning Barriers/Preferences:  Learning Barriers/Preferences - 05/11/24 1313       Learning Barriers/Preferences   Learning Barriers None    Learning Preferences Video;Written Material;Pictoral          Education Topics: How Lungs Work and Diseases: - Discuss the anatomy of the lungs and diseases that can affect the lungs, such as COPD. Flowsheet Row PULMONARY REHAB OTHER RESPIRATORY from 06/08/2020 in [place] [facility]  Date 03/12/18  Educator D. Coad  Instruction Review Code 2- Demonstrated Understanding    Exercise: -Discuss the importance of exercise, FITT principles of exercise, normal and abnormal responses to exercise, and how to exercise safely. Flowsheet Row PULMONARY REHAB OTHER RESPIRATORY from 06/08/2020 in [place] [facility]  Date 03/05/18  Educator GC  Instruction Review Code 2- Demonstrated Understanding    Environmental Irritants: -Discuss types of environmental irritants and how to limit exposure to environmental irritants. Flowsheet Row PULMONARY REHAB OTHER RESPIRATORY from 06/08/2020  in [place] [facility]  Date 05/18/20  Educator CHARM Louder  Instruction Review Code 1- Verbalizes Understanding    Meds/Inhalers and oxygen : - Discuss respiratory medications, definition of an inhaler and oxygen , and the proper way to use an inhaler and oxygen . Flowsheet Row PULMONARY REHAB OTHER RESPIRATORY from 06/08/2020 in [place] [facility]  Date 03/26/18  Educator DC    Energy Saving Techniques: - Discuss methods to conserve energy and decrease shortness of breath when performing activities of daily living.  Flowsheet Row PULMONARY REHAB OTHER RESPIRATORY from 06/08/2020 in [place] [facility]  Date 04/02/18  Educator GC  Instruction Review Code 2- Demonstrated Understanding    Bronchial Hygiene / Breathing Techniques: - Discuss breathing mechanics, pursed-lip breathing technique,  proper posture, effective ways to clear airways, and other functional breathing techniques Flowsheet Row PULMONARY REHAB OTHER RESPIRATORY from 06/08/2020 in [place] [facility]  Date 04/09/18  Educator DC  Instruction Review Code 2- Demonstrated Understanding    Cleaning Equipment: - Provides group verbal and written instruction about the health risks of elevated stress, cause of high stress, and healthy ways to reduce stress. Flowsheet Row PULMONARY REHAB OTHER RESPIRATORY from 06/08/2020 in [place] [facility]  Date 01/15/18  Educator  GC  Instruction Review Code 2- Demonstrated Understanding    Nutrition I: Fats: - Discuss the types of cholesterol, what cholesterol does to the body, and how cholesterol levels can be controlled. Flowsheet Row PULMONARY REHAB OTHER RESPIRATORY from 06/08/2020 in [place] [facility]  Date 01/22/18  Educator GC  Instruction Review Code 2- Demonstrated Understanding    Nutrition II: Labels: -Discuss the different components of food labels and how to read food labels. Flowsheet Row  PULMONARY REHAB OTHER RESPIRATORY from 06/08/2020 in [place] [facility]  Date 01/29/18  Educator DJ  Instruction Review Code 2- Demonstrated Understanding    Respiratory Infections: - Discuss the signs and symptoms of respiratory infections, ways to prevent respiratory infections, and the importance of seeking medical treatment when having a respiratory infection. Flowsheet Row PULMONARY REHAB OTHER RESPIRATORY from 06/08/2020 in [place] [facility]  Date 02/05/18  Educator GC  Instruction Review Code 2- Demonstrated Understanding    Stress I: Signs and Symptoms: - Discuss the causes of stress, how stress may lead to anxiety and depression, and ways to limit stress.   Stress II: Relaxation: -Discuss relaxation techniques to limit stress. Flowsheet Row PULMONARY REHAB OTHER RESPIRATORY from 06/08/2020 in [place] [facility]  Date 04/20/20  Educator DF  Instruction Review Code 2- Demonstrated Understanding    Oxygen  for Home/Travel: - Discuss how to prepare for travel when on oxygen  and proper ways to transport and store oxygen  to ensure safety.   Knowledge Questionnaire Score:  Knowledge Questionnaire Score - 05/11/24 1324       Knowledge Questionnaire Score   Pre Score 15/18          Core Components/Risk Factors/Patient Goals at Admission:  Personal Goals and Risk Factors at Admission - 05/11/24 1312       Core Components/Risk Factors/Patient Goals on Admission   Improve shortness of breath with ADL's Yes    Intervention Provide education, individualized exercise plan and daily activity instruction to help decrease symptoms of SOB with activities of daily living.    Expected Outcomes Short Term: Improve cardiorespiratory fitness to achieve a reduction of symptoms when performing ADLs;Long Term: Be able to perform more ADLs without symptoms or delay the onset of symptoms    Increase knowledge of respiratory medications and ability  to use respiratory devices properly  Yes    Intervention Provide education and demonstration as needed of appropriate use of medications, inhalers, and oxygen  therapy.    Expected Outcomes Long Term: Maintain appropriate use of medications, inhalers, and oxygen  therapy.;Short Term: Achieves understanding of medications use. Understands that oxygen  is a medication prescribed by physician. Demonstrates appropriate use of inhaler and oxygen  therapy.    Heart Failure Yes    Intervention Provide a combined exercise and nutrition program that is supplemented with education, support and counseling about heart failure. Directed toward relieving symptoms such as shortness of breath, decreased exercise tolerance, and extremity edema.    Expected Outcomes Improve functional capacity of life;Short term: Attendance in program 2-3 days a week with increased exercise capacity. Reported lower sodium intake. Reported increased fruit and vegetable intake. Reports medication compliance.;Short term: Daily weights obtained and reported for increase. Utilizing diuretic protocols set by physician.;Long term: Adoption of self-care skills and reduction of barriers for early signs and symptoms recognition and intervention leading to self-care maintenance.    Hypertension Yes    Intervention Provide education on lifestyle modifcations including regular physical activity/exercise, weight management, moderate sodium restriction and increased consumption of  fresh fruit, vegetables, and low fat dairy, alcohol moderation, and smoking cessation.;Monitor prescription use compliance.    Expected Outcomes Short Term: Continued assessment and intervention until BP is < 140/52mm HG in hypertensive participants. < 130/92mm HG in hypertensive participants with diabetes, heart failure or chronic kidney disease.;Long Term: Maintenance of blood pressure at goal levels.    Lipids Yes    Intervention Provide education and support for participant on  nutrition & aerobic/resistive exercise along with prescribed medications to achieve LDL 70mg , HDL >40mg .    Expected Outcomes Long Term: Cholesterol controlled with medications as prescribed, with individualized exercise RX and with personalized nutrition plan. Value goals: LDL < 70mg , HDL > 40 mg.;Short Term: Participant states understanding of desired cholesterol values and is compliant with medications prescribed. Participant is following exercise prescription and nutrition guidelines.          Core Components/Risk Factors/Patient Goals Review:   Goals and Risk Factor Review     Row Name 07/01/24 1151 08/10/24 1058 08/24/24 1043 09/21/24 1108       Core Components/Risk Factors/Patient Goals Review   Personal Goals Review Improve shortness of breath with ADL's;Develop more efficient breathing techniques such as purse lipped breathing and diaphragmatic breathing and practicing self-pacing with activity.;Increase knowledge of respiratory medications and ability to use respiratory devices properly.;Weight Management/Obesity Improve shortness of breath with ADL's;Develop more efficient breathing techniques such as purse lipped breathing and diaphragmatic breathing and practicing self-pacing with activity.;Increase knowledge of respiratory medications and ability to use respiratory devices properly.;Weight Management/Obesity Improve shortness of breath with ADL's;Develop more efficient breathing techniques such as purse lipped breathing and diaphragmatic breathing and practicing self-pacing with activity.;Increase knowledge of respiratory medications and ability to use respiratory devices properly.;Weight Management/Obesity Improve shortness of breath with ADL's;Develop more efficient breathing techniques such as purse lipped breathing and diaphragmatic breathing and practicing self-pacing with activity.;Increase knowledge of respiratory medications and ability to use respiratory devices properly.;Weight  Management/Obesity    Review Hensley is doing great in rehab. Trace has COPD and is on 3L/min via nasal cannula, continous, and wears a CPAP at bedtime; which are all working very well for him. Shlomo has noted some issues with constipation and more gas. We spoke about him following up with his GI doctor. Jedediah is doing well in rehab. He just came back from a two week break from his breathing being rough with allergies. He states it is a lot easier to breathe in the cooler months. He is taking all his medications as prescribed and checks his BP and O2 at home. Jaqualyn is doing well in rehab. He feels his breathing is doing well since the cooler weather has came. He checks his oxygen  frequently and checks BP at home. He takes all his medications as prescribed. Adonias is doing well in rehab. He feels his breathing is doing well since the cooler weather has came. He checks his oxygen  frequently and checks BP at home. He takes all his medications as prescribed. He has been feeling bloated in the morning and we talked about following up with Pulm and redoing sleep study. He lost weight and CPAP may need to be readjusted due to in swolling air and it getting trapped.    Expected Outcomes Short: follow up with his GI doctor. Long: continue to wear his oxygen  devices as prescribed. Short: Continue to attend rehab. Long: Continue to wear his oxygen  devices as prescribed. Short: Continue to attend rehab. Long: Continue to wear his oxygen  devices as prescribed. Short:  follow up with Pulm Dr.   darra: cotninue with CPAP and taling medicaiton       Core Components/Risk Factors/Patient Goals at Discharge (Final Review):   Goals and Risk Factor Review - 09/21/24 1108       Core Components/Risk Factors/Patient Goals Review   Personal Goals Review Improve shortness of breath with ADL's;Develop more efficient breathing techniques such as purse lipped breathing and diaphragmatic breathing and practicing self-pacing with  activity.;Increase knowledge of respiratory medications and ability to use respiratory devices properly.;Weight Management/Obesity    Review Mehar is doing well in rehab. He feels his breathing is doing well since the cooler weather has came. He checks his oxygen  frequently and checks BP at home. He takes all his medications as prescribed. He has been feeling bloated in the morning and we talked about following up with Pulm and redoing sleep study. He lost weight and CPAP may need to be readjusted due to in swolling air and it getting trapped.    Expected Outcomes Short: follow up with Pulm Dr.   darra: cotninue with CPAP and taling medicaiton          ITP Comments:  ITP Comments     Row Name 05/11/24 1308 05/17/24 1029 05/19/24 0925 06/08/24 1037 06/16/24 1150   ITP Comments Completed virtual orientation today.  EP evaluation is scheduled for 05/17/24 at 1000 .  Documentation for diagnosis can be found in [facility] encounter 04/28/24. Patient attend orientation today.  Patient is attending Pulmonary Rehabilitation Program.  Documentation for diagnosis can be found in [facility].  Reviewed medical chart, RPE/RPD, gym safety, and program guidelines.  Patient was fitted to equipment they will be using during rehab.  Patient is scheduled to start exercise on 06/08/24.   Initial ITP created and sent for review and signature by Dr. Anton Kelp, Medical Director for Pulmonary Rehabilitation Program. 30 day review completed. ITP sent to Dr.Jehanzeb Memon, Medical Director of  Pulmonary Rehab. Continue with ITP unless changes are made by physician.  New to program, only attended orientation thus far. First full day of exercise!  Patient was oriented to gym and equipment including functions, settings, policies, and procedures.  Patient's individual exercise prescription and treatment plan were reviewed.  All starting workloads were established based on the results of the 6 minute walk test done at initial orientation visit.  The  plan for exercise progression was also introduced and progression will be customized based on patient's performance and goals. 30 day review completed. ITP sent to Dr.Jehanzeb Memon, Medical Director of  Pulmonary Rehab. Continue with ITP unless changes are made by physician.   Still new to program with start delayed to 06/08/24 and has only completed two exercise sessions thus far.    Row Name 07/14/24 1507 08/11/24 1527 09/08/24 0854 10/05/24 1049 11/02/24 1438   ITP Comments 30 day review completed. ITP sent to Dr.Jehanzeb Memon, Medical Director of  Pulmonary Rehab. Continue with ITP unless changes are made by physician. 30 day review completed. ITP sent to Dr.Jehanzeb Memon, Medical Director of  Pulmonary Rehab. Continue with ITP unless changes are made by physician. 30 day review completed. ITP sent to Dr.Jehanzeb Memon, Medical Director of  Pulmonary Rehab. Continue with ITP unless changes are made by physician. 30 day review completed. ITP sent to Dr.Jehanzeb Memon, Medical Director of  Pulmonary Rehab. Continue with ITP unless changes are made by physician. 30 day review completed. ITP sent to Dr.Jehanzeb Memon, Medical Director of  Pulmonary Rehab. Continue with  ITP unless changes are made by physician.      Comments: 30 day review      [1]  Current Outpatient Medications:    albuterol  (VENTOLIN  HFA) 108 (90 Base) MCG/ACT inhaler, INHALE 1-2 PUFFS BY MOUTH EVERY 6 HOURS AS NEEDED FOR WHEEZING AND SHORTNESS OF BREATH, Disp: 8.5 g, Rfl: 11   amLODipine  (NORVASC ) 5 MG tablet, TAKE 1 TABLET BY MOUTH DAILY, Disp: 30 tablet, Rfl: 11   fluticasone  (FLONASE ) 50 MCG/ACT nasal spray, INSTILL TWO (2) SPRAYS IN EACH NOSTRIL TWICE DAILY, Disp: 16 g, Rfl: 11   Fluticasone -Umeclidin-Vilant (TRELEGY ELLIPTA ) 200-62.5-25 MCG/ACT AEPB, INHALE 1 PUFF BY MOUTH DAILY, Disp: 60 each, Rfl: 9   linaclotide  (LINZESS ) 72 MCG capsule, Take 1 capsule (72 mcg total) by mouth daily before breakfast., Disp: , Rfl:     OXYGEN , Inhale 3 L into the lungs daily. continuous, Disp: , Rfl:    OZEMPIC, 1 MG/DOSE, 4 MG/3ML SOPN, SMARTSIG:0.75 Milliliter(s) SUB-Q Once a Week, Disp: , Rfl:    pantoprazole  (PROTONIX ) 40 MG tablet, Take 1 tablet (40 mg total) by mouth daily. 30 minutes before breakfast for reflux, Disp: 90 tablet, Rfl: 3   roflumilast  (DALIRESP ) 500 MCG TABS tablet, TAKE 1 TABLET BY MOUTH DAILY, Disp: 30 tablet, Rfl: 11   sildenafil  (VIAGRA ) 50 MG tablet, Take 1 tablet (50 mg total) by mouth daily as needed for erectile dysfunction., Disp: 30 tablet, Rfl: 1   spironolactone (ALDACTONE) 25 MG tablet, Take 25 mg by mouth daily., Disp: , Rfl:  [2]  Social History Tobacco Use  Smoking Status Former   Current packs/day: 0.00   Average packs/day: 1 pack/day for 38.0 years (38.0 ttl pk-yrs)   Types: Cigarettes   Start date: 02/20/1982   Quit date: 02/21/2020   Years since quitting: 4.7  Smokeless Tobacco Never

## 2024-11-02 NOTE — Progress Notes (Signed)
 Daily Session Note  Patient Details  Name: Ricardo Burch MRN: [id number] Date of Birth: 28-Jul-1961 Referring Provider:   Flowsheet Row PULMONARY REHAB OTHER RESP ORIENTATION from 05/17/2024 in [facility] CARDIAC REHABILITATION  Referring Provider Theophilus Roosevelt MD    Encounter Date: 11/02/2024  Check In:  Session Check In - 11/02/24 1035       Check-In   Supervising physician immediately available to respond to emergencies See telemetry face sheet for immediately available MD          Capillary Blood Glucose: No results found for this or any previous visit (from the past 24 hours).    Tobacco Use History[1]  Goals Met:  Independence with exercise equipment Exercise tolerated well Strength training completed today  Goals Unmet:  Not Applicable  Comments: Pt able to follow exercise prescription today without complaint.  Will continue to monitor for progression.        [1]  Social History Tobacco Use  Smoking Status Former   Current packs/day: 0.00   Average packs/day: 1 pack/day for 38.0 years (38.0 ttl pk-yrs)   Types: Cigarettes   Start date: 02/20/1982   Quit date: 02/21/2020   Years since quitting: 4.7  Smokeless Tobacco Never

## 2024-11-09 ENCOUNTER — Encounter (HOSPITAL_COMMUNITY): Attending: Pulmonary Disease

## 2024-11-09 ENCOUNTER — Telehealth (HOSPITAL_COMMUNITY): Payer: Self-pay

## 2024-11-09 NOTE — Telephone Encounter (Signed)
 Called patient to check in on him for missing class, he was ok just needed today to rest. Will continue to check in on patient if needed.

## 2024-11-10 ENCOUNTER — Encounter (HOSPITAL_COMMUNITY): Payer: Self-pay

## 2024-11-10 ENCOUNTER — Other Ambulatory Visit: Payer: Self-pay

## 2024-11-10 ENCOUNTER — Emergency Department (HOSPITAL_COMMUNITY)
Admission: EM | Admit: 2024-11-10 | Discharge: 2024-11-10 | Discharge status: Against Medical Advice | Attending: Emergency Medicine | Admitting: Emergency Medicine

## 2024-11-10 ENCOUNTER — Emergency Department (HOSPITAL_COMMUNITY)

## 2024-11-10 DIAGNOSIS — Z5321 Procedure and treatment not carried out due to patient leaving prior to being seen by health care provider: Secondary | ICD-10-CM | POA: Diagnosis not present

## 2024-11-10 DIAGNOSIS — R222 Localized swelling, mass and lump, trunk: Secondary | ICD-10-CM | POA: Diagnosis present

## 2024-11-10 NOTE — ED Triage Notes (Signed)
 Pt came in via POV d/t a dermatologist sending him over here to ED for eval from a spot that pt states started out as a black head on his chest & is now a quarter sized knot that my need drained (per pt). Denies any pain or discomfort.

## 2024-11-11 ENCOUNTER — Encounter (HOSPITAL_COMMUNITY)
Admission: RE | Admit: 2024-11-11 | Discharge: 2024-11-11 | Disposition: A | Discharge status: Home / Self Care | Source: Ambulatory Visit | Attending: Pulmonary Disease | Admitting: Pulmonary Disease

## 2024-11-11 DIAGNOSIS — J439 Emphysema, unspecified: Secondary | ICD-10-CM | POA: Insufficient documentation

## 2024-11-11 DIAGNOSIS — J4489 Other specified chronic obstructive pulmonary disease: Secondary | ICD-10-CM | POA: Diagnosis present

## 2024-11-11 NOTE — Progress Notes (Signed)
 Daily Session Note  Patient Details  Name: Ricardo Burch MRN: [id number] Date of Birth: [birth year] Referring Provider:   Flowsheet Row PULMONARY REHAB OTHER RESP ORIENTATION from 05/17/2024 in [facility] CARDIAC REHABILITATION  Referring Provider Theophilus Roosevelt MD    Encounter Date: 11/11/2024  Check In:   Capillary Blood Glucose: No results found for this or any previous visit (from the past 24 hours).    Tobacco Use History[1]  Goals Met:  Proper associated with RPD/PD & O2 Sat Independence with exercise equipment Using PLB without cueing & demonstrates good technique Exercise tolerated well No report of concerns or symptoms today Strength training completed today  Goals Unmet:  Not Applicable  Comments: Pt able to follow exercise prescription today without complaint.  Will continue to monitor for progression.        [1]  Social History Tobacco Use  Smoking Status Former   Current packs/day: 0.00   Average packs/day: 1 pack/day for 38.0 years (38.0 ttl pk-yrs)   Types: Cigarettes   Start date: 02/20/1982   Quit date: 02/21/2020   Years since quitting: 4.7  Smokeless Tobacco Never

## 2024-11-16 ENCOUNTER — Encounter (HOSPITAL_COMMUNITY)
Admission: RE | Admit: 2024-11-16 | Discharge: 2024-11-16 | Disposition: A | Source: Ambulatory Visit | Attending: Pulmonary Disease

## 2024-11-16 DIAGNOSIS — J4489 Other specified chronic obstructive pulmonary disease: Secondary | ICD-10-CM | POA: Diagnosis not present

## 2024-11-16 NOTE — Progress Notes (Signed)
 Daily Session Note  Patient Details  Name: Ricardo Burch MRN: [id number] Date of Birth: 11-02-1961 Referring Provider:   Flowsheet Row PULMONARY REHAB OTHER RESP ORIENTATION from 05/17/2024 in [facility] CARDIAC REHABILITATION  Referring Provider Theophilus Roosevelt MD    Encounter Date: 11/16/2024  Check In:  Session Check In - 11/16/24 1042       Check-In   Supervising physician immediately available to respond to emergencies See telemetry face sheet for immediately available MD    Location [facility]    Staff Present Powell Benders, BS, Exercise Physiologist;Jessica Vonzell, MA, RCEP, CCRP, CCET;Victoria Zina, RN    Virtual Visit No    Medication changes reported     Yes    Comments Started spironolactone  back, see cheart for doasge    Fall or balance concerns reported    No    Tobacco Cessation No Change    Warm-up and Cool-down Performed on first and last piece of equipment    Resistance Training Performed Yes    VAD Patient? No    PAD/SET Patient? No      Pain Assessment   Currently in Pain? No/denies    Pain Score 0-No pain    Multiple Pain Sites No          Capillary Blood Glucose: No results found for this or any previous visit (from the past 24 hours).    Tobacco Use History[1]  Goals Met:  Independence with exercise equipment Using PLB without cueing & demonstrates good technique Exercise tolerated well Queuing for purse lip breathing No report of concerns or symptoms today Strength training completed today  Goals Unmet:  Not Applicable  Comments: Pt able to follow exercise prescription today without complaint.  Will continue to monitor for progression.        [1]  Social History Tobacco Use  Smoking Status Former   Current packs/day: 0.00   Average packs/day: 1 pack/day for 38.0 years (38.0 ttl pk-yrs)   Types: Cigarettes   Start date: 02/20/1982   Quit date: 02/21/2020   Years since quitting: 4.7  Smokeless Tobacco Never

## 2024-11-18 ENCOUNTER — Encounter (HOSPITAL_COMMUNITY)
Admission: RE | Admit: 2024-11-18 | Discharge: 2024-11-18 | Disposition: A | Source: Ambulatory Visit | Attending: Pulmonary Disease

## 2024-11-18 VITALS — Ht 69.0 in | Wt 240.5 lb

## 2024-11-18 DIAGNOSIS — J4489 Other specified chronic obstructive pulmonary disease: Secondary | ICD-10-CM | POA: Diagnosis not present

## 2024-11-18 NOTE — Progress Notes (Signed)
 Daily Session Note  Patient Details  Name: Ricardo Burch MRN: [id number] Date of Birth: 1961-06-14 Referring Provider:   Flowsheet Row PULMONARY REHAB OTHER RESP ORIENTATION from 05/17/2024 in [facility] CARDIAC REHABILITATION  Referring Provider Theophilus Roosevelt MD    Encounter Date: 11/18/2024  Check In:  Session Check In - 11/18/24 1040       Check-In   Supervising physician immediately available to respond to emergencies See telemetry face sheet for immediately available MD    Location [facility]    Staff Present Powell Benders, BS, Exercise Physiologist;Jessica Vonzell, MA, RCEP, CCRP, CCET;Dezaree Tracey BSN, RN    Virtual Visit No    Medication changes reported     No    Fall or balance concerns reported    No    Tobacco Cessation No Change    Warm-up and Cool-down Performed on first and last piece of equipment    Resistance Training Performed Yes    VAD Patient? No    PAD/SET Patient? No      Pain Assessment   Currently in Pain? No/denies    Pain Score 0-No pain    Multiple Pain Sites No          Capillary Blood Glucose: No results found for this or any previous visit (from the past 24 hours).    Tobacco Use History[1]  Goals Met:  Proper associated with RPD/PD & O2 Sat Independence with exercise equipment Using PLB without cueing & demonstrates good technique Exercise tolerated well Queuing for purse lip breathing No report of concerns or symptoms today Strength training completed today  Goals Unmet:  Not Applicable  Comments: .Pt able to follow exercise prescription today without complaint.  Will continue to monitor for progression.       [1]  Social History Tobacco Use  Smoking Status Former   Current packs/day: 0.00   Average packs/day: 1 pack/day for 38.0 years (38.0 ttl pk-yrs)   Types: Cigarettes   Start date: 02/20/1982   Quit date: 02/21/2020   Years since quitting: 4.7  Smokeless Tobacco Never

## 2024-11-18 NOTE — Patient Instructions (Signed)
 Discharge Patient Instructions  Patient Details  Name: Ricardo Burch MRN: [id number] Date of Birth: 1960-12-08 Referring Provider:  Tobie Suzzane POUR, MD   Number of Visits: 73  Reason for Discharge:  Patient reached a stable level of exercise. Patient independent in their exercise. Patient has met program and personal goals.  Smoking History:  Social History   Tobacco Use  Smoking Status Former   Current packs/day: 0.00   Average packs/day: 1 pack/day for 38.0 years (38.0 ttl pk-yrs)   Types: Cigarettes   Start date: 02/20/1982   Quit date: 02/21/2020   Years since quitting: 4.7  Smokeless Tobacco Never    Diagnosis:  COPD with chronic bronchitis and emphysema (HCC)  Initial Exercise Prescription:   Discharge Exercise Prescription (Final Exercise Prescription Changes):  Exercise Prescription Changes - 11/02/24 1500       Response to Exercise   Blood Pressure (Admit) 118/70    Blood Pressure (Exit) 120/70    Heart Rate (Admit) 76 bpm    Heart Rate (Exercise) 88 bpm    Heart Rate (Exit) 87 bpm    Oxygen  Saturation (Admit) 91 %    Oxygen  Saturation (Exercise) 89 %    Oxygen  Saturation (Exit) 91 %    Rating of Perceived Exertion (Exercise) 12    Perceived Dyspnea (Exercise) 1    Duration Continue with 30 min of aerobic exercise without signs/symptoms of physical distress.    Intensity THRR unchanged      Progression   Progression Continue to progress workloads to maintain intensity without signs/symptoms of physical distress.      Resistance Training   Weight 7    Reps 10-15      Oxygen    Oxygen  Continuous    Liters 3      Treadmill   MPH 1    Grade 0    Minutes 15    METs 1.99      NuStep   Level 3    SPM 105    Minutes 15    METs 2.9      Oxygen    Maintain Oxygen  Saturation 88% or higher          Functional Capacity:  6 Minute Walk     Row Name 11/18/24 1118         6 Minute Walk   Phase Discharge     Distance 800 feet     Distance  % Change 40.3 %     Distance Feet Change 230 ft     Walk Time 5.3 minutes     # of Rest Breaks 0     MPH 1.72     METS 2.33     RPE 13     Perceived Dyspnea  3     VO2 Peak 8.18     Symptoms Yes (comment)     Comments Pt O2 dropped to 78 ended at 5 min 30 sec     Resting HR 80 bpm     Resting BP 120/60     Resting Oxygen  Saturation  92 %     Exercise Oxygen  Saturation  during 6 min walk 78 %     Max Ex. HR 103 bpm     Max Ex. BP 156/70       Interval HR   1 Minute HR 90     2 Minute HR 96     3 Minute HR 98     4 Minute HR 102     5  Minute HR 103     6 Minute HR 102     2 Minute Post HR 86     Interval Heart Rate? Yes       Interval Oxygen    Interval Oxygen ? Yes     Baseline Oxygen  Saturation % 92 %     1 Minute Oxygen  Saturation % 90 %     1 Minute Liters of Oxygen  4 L     2 Minute Oxygen  Saturation % 82 %     2 Minute Liters of Oxygen  4 L     3 Minute Oxygen  Saturation % 81 %     3 Minute Liters of Oxygen  4 L     4 Minute Oxygen  Saturation % 80 %     4 Minute Liters of Oxygen  4 L     5 Minute Oxygen  Saturation % 80 %     5 Minute Liters of Oxygen  4 L     6 Minute Oxygen  Saturation % 78 %     6 Minute Liters of Oxygen  4 L     2 Minute Post Oxygen  Saturation % 92 %     2 Minute Post Liters of Oxygen  4 L       Nutrition & Weight - Outcomes:   Post Biometrics - 11/18/24 1122        Post  Biometrics   Height 5' 9 (1.753 m)    Weight 109.1 kg    Waist Circumference 48 inches    Hip Circumference 42 inches    Waist to Hip Ratio 1.14 %    BMI (Calculated) 35.5    Grip Strength 14.8 kg          Goals reviewed with patient; copy given to patient.

## 2024-11-23 ENCOUNTER — Encounter (HOSPITAL_COMMUNITY)
Admission: RE | Admit: 2024-11-23 | Discharge: 2024-11-23 | Disposition: A | Source: Ambulatory Visit | Attending: Pulmonary Disease

## 2024-11-23 ENCOUNTER — Telehealth (HOSPITAL_COMMUNITY): Payer: Self-pay

## 2024-11-23 DIAGNOSIS — J4489 Other specified chronic obstructive pulmonary disease: Secondary | ICD-10-CM | POA: Diagnosis not present

## 2024-11-23 NOTE — Telephone Encounter (Signed)
 Erroneous encounter

## 2024-11-23 NOTE — Progress Notes (Signed)
 Daily Session Note  Patient Details  Name: Ricardo Burch MRN: [id number] Date of Birth: Mar 18, 1961 Referring Provider:   Flowsheet Row PULMONARY REHAB OTHER RESP ORIENTATION from 05/17/2024 in [facility] CARDIAC REHABILITATION  Referring Provider Theophilus Roosevelt MD    Encounter Date: 11/23/2024  Check In:  Session Check In - 11/23/24 1038       Check-In   Supervising physician immediately available to respond to emergencies See telemetry face sheet for immediately available MD    Location [facility]    Staff Present Powell Benders, BS, Exercise Physiologist;Brittany Jackquline, BSN, RN, WTA-C;Victoria Zina, RN    Virtual Visit No    Medication changes reported     No    Fall or balance concerns reported    No    Tobacco Cessation No Change    Warm-up and Cool-down Performed on first and last piece of equipment    Resistance Training Performed Yes    VAD Patient? No    PAD/SET Patient? No      Pain Assessment   Currently in Pain? No/denies    Pain Score 0-No pain    Multiple Pain Sites No          Capillary Blood Glucose: No results found for this or any previous visit (from the past 24 hours).    Tobacco Use History[1]  Goals Met:  Independence with exercise equipment Exercise tolerated well No report of concerns or symptoms today Strength training completed today  Goals Unmet:  Not Applicable  Comments: Pt able to follow exercise prescription today without complaint.  Will continue to monitor for progression.        [1]  Social History Tobacco Use  Smoking Status Former   Current packs/day: 0.00   Average packs/day: 1 pack/day for 38.0 years (38.0 ttl pk-yrs)   Types: Cigarettes   Start date: 02/20/1982   Quit date: 02/21/2020   Years since quitting: 4.7  Smokeless Tobacco Never

## 2024-11-25 ENCOUNTER — Encounter (HOSPITAL_COMMUNITY)
Admission: RE | Admit: 2024-11-25 | Discharge: 2024-11-25 | Disposition: A | Discharge status: Home / Self Care | Source: Ambulatory Visit | Attending: Pulmonary Disease | Admitting: Pulmonary Disease

## 2024-11-25 DIAGNOSIS — J439 Emphysema, unspecified: Secondary | ICD-10-CM

## 2024-11-25 DIAGNOSIS — J4489 Other specified chronic obstructive pulmonary disease: Secondary | ICD-10-CM | POA: Diagnosis not present

## 2024-11-25 NOTE — Progress Notes (Signed)
 Daily Session Note  Patient Details  Name: Ricardo Burch MRN: [id number] Date of Birth: 03/10/1961 Referring Provider:   Flowsheet Row PULMONARY REHAB OTHER RESP ORIENTATION from 05/17/2024 in [facility] CARDIAC REHABILITATION  Referring Provider Theophilus Roosevelt MD    Encounter Date: 11/25/2024  Check In:  Session Check In - 11/25/24 1015       Check-In   Supervising physician immediately available to respond to emergencies See telemetry face sheet for immediately available MD    Location [facility]    Staff Present Rolland Sake BSN, RN;Krzysztof Reichelt Vicci, RN, BSN;Heather Con, BS, Exercise Physiologist    Virtual Visit No    Medication changes reported     No    Fall or balance concerns reported    No    Warm-up and Cool-down Performed on first and last piece of equipment    Resistance Training Performed Yes    VAD Patient? No    PAD/SET Patient? No      Pain Assessment   Currently in Pain? No/denies    Pain Score 0-No pain    Multiple Pain Sites No          Capillary Blood Glucose: No results found for this or any previous visit (from the past 24 hours).    Tobacco Use History[1]  Goals Met:  Proper associated with RPD/PD & O2 Sat Independence with exercise equipment Using PLB without cueing & demonstrates good technique Exercise tolerated well No report of concerns or symptoms today Strength training completed today  Goals Unmet:  Not Applicable  Comments: Pt able to follow exercise prescription today without complaint.  Will continue to monitor for progression..        [1]  Social History Tobacco Use  Smoking Status Former   Current packs/day: 0.00   Average packs/day: 1 pack/day for 38.0 years (38.0 ttl pk-yrs)   Types: Cigarettes   Start date: 02/20/1982   Quit date: 02/21/2020   Years since quitting: 4.7  Smokeless Tobacco Never

## 2024-11-30 ENCOUNTER — Encounter (HOSPITAL_COMMUNITY)

## 2024-11-30 ENCOUNTER — Encounter (HOSPITAL_COMMUNITY): Payer: Self-pay

## 2024-11-30 DIAGNOSIS — J4489 Other specified chronic obstructive pulmonary disease: Secondary | ICD-10-CM

## 2024-11-30 NOTE — Progress Notes (Signed)
 Pulmonary Individual Treatment Plan  Patient Details  Name: Ricardo Burch MRN: [id number] Date of Birth: 1961-09-15 Referring Provider:   Flowsheet Row PULMONARY REHAB OTHER RESP ORIENTATION from 05/17/2024 in [facility] CARDIAC REH[facility]IL[facility]ATION  Referring Provider Theophilus Roosevelt MD    Initial Encounter Date:  Flowsheet Row PULMONARY REHAB OTHER RESP ORIENTATION from 05/17/2024 in [facility] CARDIAC REHABIL[facility]ATION  Date 05/17/24    Visit Diagnosis: COPD with chronic bronchitis and emphysema (HCC)  Patient's Home Medications on Admission: Current Medications[1]  Past Medical History: Past Medical History:  Diagnosis Date   Allergy    CHF (congestive heart failure) (HCC)    HFpEF   Chronic kidney disease    COPD (chronic obstructive pulmonary disease) (HCC) Dx 2015   Depression    Eczema    since childhood    Emphysema of lung (HCC)    Erectile dysfunction 08/18/2019   GERD (gastroesophageal reflux disease) 01/25/2020   Hyperlipidemia 01/12/2018   Hypertension    Oxygen  deficiency    Screening for HIV (human immunodeficiency virus)    Sleep apnea    CPAP   Substance abuse (HCC)    MJ, cocaine   Testicular failure 07/22/2019    Tobacco Use: Tobacco Use History[2]  Labs: Review Flowsheet  More data exists      Latest Ref Rng & Units 10/25/2020 05/16/2022 11/20/2022 05/19/2023 01/30/2024  Labs for ITP Cardiac and Pulmonary Rehab  Cholestrol 100 - 199 mg/dL 842  843  839  849  837   LDL (calc) 0 - 99 mg/dL 86  94  99  87  85   HDL-C >39 mg/dL 56  51  47  52  65   Trlycerides 0 - 149 mg/dL 64  52  70  49  60   Hemoglobin A1c 4.8 - 5.6 % 5.8  5.7  5.6  5.7  5.6      Pulmonary Assessment Scores:   UCSD: Self-administered rating of dyspnea associated with activities of daily living (ADLs) 6-point scale (0 = not at all to 5 = maximal or unable to do because of breathlessness)  Scoring Scores range from 0 to 120.  Minimally important difference is 5 units  CAT: CAT  can identify the health impairment of COPD patients and is better correlated with disease progression.  CAT has a scoring range of zero to 40. The CAT score is classified into four groups of low (less than 10), medium (10 - 20), high (21-30) and very high (31-40) based on the impact level of disease on health status. A CAT score over 10 suggests significant symptoms.  A worsening CAT score could be explained by an exacerbation, poor medication adherence, poor inhaler technique, or progression of COPD or comorbid conditions.  CAT MCID is 2 points  mMRC: mMRC (Modified Medical Research Council) Dyspnea Scale is used to assess the degree of baseline functional disability in patients of respiratory disease due to dyspnea. No minimal important difference is established. A decrease in score of 1 point or greater is considered a positive change.   Pulmonary Function Assessment:   Exercise Target Goals: Exercise Program Goal: Individual exercise prescription set using results from initial 6 min walk test and THRR while considering  patients activity barriers and safety.   Exercise Prescription Goal: Initial exercise prescription builds to 30-45 minutes a day of aerobic activity, 2-3 days per week.  Home exercise guidelines will be given to patient during program as part of exercise prescription that the participant will acknowledge.  Education: Aerobic Exercise: - Group verbal and visual presentation on the components of exercise prescription. Introduces F.I.T.T principle from ACSM for exercise prescriptions.  Reviews F.I.T.T. principles of aerobic exercise including progression. Written material provided at class time. Flowsheet Row PULMONARY REHAB CHRONIC OBSTRUCTIVE PULMONARY DISEASE from 11/25/2024 in [facility]  Date 09/02/24  Educator jh  Instruction Review Code 2- Demonstrated Understanding    Education: Resistance Exercise: - Group verbal and visual presentation on  the components of exercise prescription. Introduces F.I.T.T principle from ACSM for exercise prescriptions  Reviews F.I.T.T. principles of resistance exercise including progression. Written material provided at class time.    Education: Exercise & Equipment Safety: - Individual verbal instruction and demonstration of equipment use and safety with use of the equipment.   Education: Exercise Physiology & General Exercise Guidelines: - Group verbal and written instruction with models to review the exercise physiology of the cardiovascular system and associated critical values. Provides general exercise guidelines with specific guidelines to those with heart or lung disease.  Flowsheet Row PULMONARY REHAB CHRONIC OBSTRUCTIVE PULMONARY DISEASE from 11/25/2024 in [place] [facility]  Date 06/17/24  Educator hb  Instruction Review Code 2- Demonstrated Understanding    Education: Flexibility, Balance, Mind/Body Relaxation: - Group verbal and visual presentation with interactive activity on the components of exercise prescription. Introduces F.I.T.T principle from ACSM for exercise prescriptions. Reviews F.I.T.T. principles of flexibility and balance exercise training including progression. Also discusses the mind body connection.  Reviews various relaxation techniques to help reduce and manage stress (i.e. Deep breathing, progressive muscle relaxation, and visualization). Balance handout provided to take home. Written material provided at class time. Flowsheet Row PULMONARY REHAB CHRONIC OBSTRUCTIVE PULMONARY DISEASE from 11/25/2024 in [place] [facility]  Date 09/09/24  Educator HB  Instruction Review Code 1- Verbalizes Understanding    Activity Barriers & Risk Stratification:   6 Minute Walk:  6 Minute Walk     Row Name 11/18/24 1118         6 Minute Walk   Phase Discharge     Distance 800 feet     Distance % Change 40.3 %     Distance Feet Change 230 ft      Walk Time 5.3 minutes     # of Rest Breaks 0     MPH 1.72     METS 2.33     RPE 13     Perceived Dyspnea  3     VO2 Peak 8.18     Symptoms Yes (comment)     Comments Pt O2 dropped to 78 ended at 5 min 30 sec     Resting HR 80 bpm     Resting BP 120/60     Resting Oxygen  Saturation  92 %     Exercise Oxygen  Saturation  during 6 min walk 78 %     Max Ex. HR 103 bpm     Max Ex. BP 156/70       Interval HR   1 Minute HR 90     2 Minute HR 96     3 Minute HR 98     4 Minute HR 102     5 Minute HR 103     6 Minute HR 102     2 Minute Post HR 86     Interval Heart Rate? Yes       Interval Oxygen    Interval Oxygen ? Yes     Baseline Oxygen  Saturation % 92 %  1 Minute Oxygen  Saturation % 90 %     1 Minute Liters of Oxygen  4 L     2 Minute Oxygen  Saturation % 82 %     2 Minute Liters of Oxygen  4 L     3 Minute Oxygen  Saturation % 81 %     3 Minute Liters of Oxygen  4 L     4 Minute Oxygen  Saturation % 80 %     4 Minute Liters of Oxygen  4 L     5 Minute Oxygen  Saturation % 80 %     5 Minute Liters of Oxygen  4 L     6 Minute Oxygen  Saturation % 78 %     6 Minute Liters of Oxygen  4 L     2 Minute Post Oxygen  Saturation % 92 %     2 Minute Post Liters of Oxygen  4 L       Oxygen  Initial Assessment:   Oxygen  Re-Evaluation:  Oxygen  Re-Evaluation     Row Name 06/08/24 1038 07/01/24 1200 08/10/24 1100 08/24/24 1045 09/21/24 1110     Program Oxygen  Prescription   Program Oxygen  Prescription -- Continuous Continuous Continuous Continuous   Liters per minute -- 3 3 3 3      Home Oxygen    Home Oxygen  Device -- Portable Concentrator;Home Concentrator;E-Tanks Portable Concentrator;Home Concentrator;E-Tanks Portable Concentrator;Home Concentrator;E-Tanks Portable Concentrator;Home Concentrator;E-Tanks   Sleep Oxygen  Prescription -- Continuous;CPAP Continuous;CPAP Continuous;CPAP Continuous;CPAP   Liters per minute -- 3 3 3 3    Home Exercise Oxygen  Prescription -- Continuous  Continuous Continuous Continuous   Liters per minute -- 3 3 -- 3   Home Resting Oxygen  Prescription -- Continuous Continuous -- Continuous   Liters per minute -- 3 3 3 3    Compliance with Home Oxygen  Use -- -- Yes Yes Yes     Goals/Expected Outcomes   Short Term Goals -- To learn and exhibit compliance with exercise, home and travel O2 prescription;To learn and understand importance of monitoring SPO2 with pulse oximeter and demonstrate accurate use of the pulse oximeter.;To learn and understand importance of maintaining oxygen  saturations>88%;To learn and demonstrate proper pursed lip breathing techniques or other breathing techniques. ;To learn and demonstrate proper use of respiratory medications To learn and exhibit compliance with exercise, home and travel O2 prescription;To learn and understand importance of monitoring SPO2 with pulse oximeter and demonstrate accurate use of the pulse oximeter.;To learn and understand importance of maintaining oxygen  saturations>88%;To learn and demonstrate proper pursed lip breathing techniques or other breathing techniques. ;To learn and demonstrate proper use of respiratory medications To learn and exhibit compliance with exercise, home and travel O2 prescription;To learn and understand importance of monitoring SPO2 with pulse oximeter and demonstrate accurate use of the pulse oximeter.;To learn and understand importance of maintaining oxygen  saturations>88%;To learn and demonstrate proper pursed lip breathing techniques or other breathing techniques. ;To learn and demonstrate proper use of respiratory medications To learn and exhibit compliance with exercise, home and travel O2 prescription;To learn and understand importance of monitoring SPO2 with pulse oximeter and demonstrate accurate use of the pulse oximeter.;To learn and understand importance of maintaining oxygen  saturations>88%;To learn and demonstrate proper pursed lip breathing techniques or other  breathing techniques. ;To learn and demonstrate proper use of respiratory medications   Long  Term Goals -- Exhibits compliance with exercise, home  and travel O2 prescription;Verbalizes importance of monitoring SPO2 with pulse oximeter and return demonstration;Exhibits proper breathing techniques, such as pursed lip breathing or other method taught during program  session;Maintenance of O2 saturations>88%;Compliance with respiratory medication;Demonstrates proper use of MDIs Exhibits compliance with exercise, home  and travel O2 prescription;Verbalizes importance of monitoring SPO2 with pulse oximeter and return demonstration;Exhibits proper breathing techniques, such as pursed lip breathing or other method taught during program session;Maintenance of O2 saturations>88%;Compliance with respiratory medication;Demonstrates proper use of MDIs Exhibits compliance with exercise, home  and travel O2 prescription;Verbalizes importance of monitoring SPO2 with pulse oximeter and return demonstration;Exhibits proper breathing techniques, such as pursed lip breathing or other method taught during program session;Maintenance of O2 saturations>88%;Compliance with respiratory medication;Demonstrates proper use of MDIs Exhibits compliance with exercise, home  and travel O2 prescription;Verbalizes importance of monitoring SPO2 with pulse oximeter and return demonstration;Exhibits proper breathing techniques, such as pursed lip breathing or other method taught during program session;Maintenance of O2 saturations>88%;Compliance with respiratory medication;Demonstrates proper use of MDIs   Comments Reviewed PLB technique with pt.  Talked about how it works and it's importance in maintaining their exercise saturations. Ricardo Burch continues to keep a check on his oxygen  saturations at home. He wears his cpap at bedtime. He notes he breathing is good here and at home, noting when he feels exerted he stops and does his PLB techniques.  Ricardo Burch is doing well in rehab. He just came back from a two week break from his breathing being rough with allergies. He states it is a lot easier to breathe in the cooler months. When he feels SOB he uses his PLB techniques. Ricardo Burch is doing well in rehab. He feels his breathing is ok, it hasn't gotten any worse. The cooler months make is easier to breathe. He is compliant with his oxygen  and CPAP. He is going to ask his doctor about doing another sleep study to see if he can get off his CPAP at night. Ricardo Burch is doing well in rehab. He feels his breathing is good and it hasn't gotten any worse. He feels that cooler months make is easier to breathe. He is compliant with his oxygen  and CPAP. He is going to ask his doctor about doing another sleep study to see if his CPAP needs to be readjusted die to weight loss   Goals/Expected Outcomes Short: Become more profiecient at using PLB.   Long: Become independent at using PLB. Short: continue to attand rehab. Long: continue using PLB techniques when needed. Short: continue to attand rehab. Long: continue using PLB techniques when needed. Short: continue to attand rehab. Long: Speak with doctor about another sleep study. Short: follow up with Pulm Dr,   long: continue to use oxygen  and CPAP    Row Name 11/18/24 1105             Program Oxygen  Prescription   Program Oxygen  Prescription None;E-Tanks       Liters per minute 4         Home Oxygen    Home Oxygen  Device Portable Concentrator;Home Concentrator;E-Tanks       Sleep Oxygen  Prescription Continuous;CPAP       Liters per minute 3       Home Exercise Oxygen  Prescription Continuous       Liters per minute 3       Home Resting Oxygen  Prescription Continuous       Liters per minute 4       Compliance with Home Oxygen  Use Yes         Goals/Expected Outcomes   Short Term Goals To learn and exhibit compliance with exercise, home and travel O2 prescription;To learn and  understand importance of monitoring SPO2  with pulse oximeter and demonstrate accurate use of the pulse oximeter.;To learn and understand importance of maintaining oxygen  saturations>88%;To learn and demonstrate proper pursed lip breathing techniques or other breathing techniques. ;To learn and demonstrate proper use of respiratory medications       Long  Term Goals Exhibits compliance with exercise, home  and travel O2 prescription;Verbalizes importance of monitoring SPO2 with pulse oximeter and return demonstration;Exhibits proper breathing techniques, such as pursed lip breathing or other method taught during program session;Maintenance of O2 saturations>88%;Compliance with respiratory medication;Demonstrates proper use of MDIs       Comments Ricardo Burch is now using 4L O2 during exercise since his sats have started to drop more since restarting spironolactone.  he plans to talk to his doctor about this.  His is still mainting his compliance with Oxygen  and CPAP at night.  He is able to do more since doing rehab.  He is good with his inhaler and nebulizer.  He is also using the PLB to manage his breathing regularly.       Goals/Expected Outcomes short; mention spironolactone dropping sats Long; Continued compliance          Oxygen  Discharge (Final Oxygen  Re-Evaluation):  Oxygen  Re-Evaluation - 11/18/24 1105       Program Oxygen  Prescription   Program Oxygen  Prescription None;E-Tanks    Liters per minute 4      Home Oxygen    Home Oxygen  Device Portable Concentrator;Home Concentrator;E-Tanks    Sleep Oxygen  Prescription Continuous;CPAP    Liters per minute 3    Home Exercise Oxygen  Prescription Continuous    Liters per minute 3    Home Resting Oxygen  Prescription Continuous    Liters per minute 4    Compliance with Home Oxygen  Use Yes      Goals/Expected Outcomes   Short Term Goals To learn and exhibit compliance with exercise, home and travel O2 prescription;To learn and understand importance of monitoring SPO2 with pulse oximeter and  demonstrate accurate use of the pulse oximeter.;To learn and understand importance of maintaining oxygen  saturations>88%;To learn and demonstrate proper pursed lip breathing techniques or other breathing techniques. ;To learn and demonstrate proper use of respiratory medications    Long  Term Goals Exhibits compliance with exercise, home  and travel O2 prescription;Verbalizes importance of monitoring SPO2 with pulse oximeter and return demonstration;Exhibits proper breathing techniques, such as pursed lip breathing or other method taught during program session;Maintenance of O2 saturations>88%;Compliance with respiratory medication;Demonstrates proper use of MDIs    Comments Ricardo Burch is now using 4L O2 during exercise since his sats have started to drop more since restarting spironolactone.  he plans to talk to his doctor about this.  His is still mainting his compliance with Oxygen  and CPAP at night.  He is able to do more since doing rehab.  He is good with his inhaler and nebulizer.  He is also using the PLB to manage his breathing regularly.    Goals/Expected Outcomes short; mention spironolactone dropping sats Long; Continued compliance          Initial Exercise Prescription:   Perform Capillary Blood Glucose checks as needed.  Exercise Prescription Changes:   Exercise Prescription Changes     Row Name 06/29/24 1500 07/29/24 1500 08/17/24 1500 09/21/24 1500 10/07/24 1500     Response to Exercise   Blood Pressure (Admit) 105/70 122/62 122/70 126/62 120/64   Blood Pressure (Exercise) 134/76 -- -- -- --   Blood Pressure (Exit) 118/60  112/70 100/68 118/64 120/70   Heart Rate (Admit) 78 bpm 63 bpm 79 bpm 69 bpm 82 bpm   Heart Rate (Exercise) 100 bpm 84 bpm 92 bpm 82 bpm 100 bpm   Heart Rate (Exit) 77 bpm 73 bpm 85 bpm 77 bpm 80 bpm   Oxygen  Saturation (Admit) 91 % 90 % 90 % 90 % 90 %   Oxygen  Saturation (Exercise) 87 % 87 % 89 % 91 % 87 %   Oxygen  Saturation (Exit) 94 % 91 % 91 % 95 % 93 %    Rating of Perceived Exertion (Exercise) 12 11 12 11 13    Perceived Dyspnea (Exercise) 2 1 1 1 1    Duration Continue with 30 min of aerobic exercise without signs/symptoms of physical distress. Continue with 30 min of aerobic exercise without signs/symptoms of physical distress. Continue with 30 min of aerobic exercise without signs/symptoms of physical distress. Continue with 30 min of aerobic exercise without signs/symptoms of physical distress. Continue with 30 min of aerobic exercise without signs/symptoms of physical distress.   Intensity THRR unchanged THRR unchanged THRR unchanged THRR unchanged THRR unchanged     Progression   Progression Continue to progress workloads to maintain intensity without signs/symptoms of physical distress. Continue to progress workloads to maintain intensity without signs/symptoms of physical distress. Continue to progress workloads to maintain intensity without signs/symptoms of physical distress. Continue to progress workloads to maintain intensity without signs/symptoms of physical distress. Continue to progress workloads to maintain intensity without signs/symptoms of physical distress.     Resistance Training   Training Prescription Yes Yes Yes Yes Yes   Weight 7 7 7 7 7    Reps 10-15 10-15 10-15 10-15 10-15     Oxygen    Oxygen  Continuous Continuous Continuous Continuous Continuous   Liters 4 4 3 3 3      Treadmill   MPH 1.7 1 1.4 1.4 1.4   Grade 0 0 0 0 1   Minutes 15 15 15 15 15    METs 2.3 1.77 2.07 2.07 2.27     NuStep   Level -- -- 4 4 4    SPM -- -- 95 90 97   Minutes -- -- 15 15 15    METs -- -- 2.7 2.1 2.9     REL-XR   Level 1 3 -- -- --   Speed 46 94 -- -- --   Minutes 15 15 -- -- --   METs 2.1 2.6 -- -- --     Oxygen    Maintain Oxygen  Saturation 88% or higher 88% or higher 88% or higher 88% or higher 88% or higher    Row Name 11/02/24 1500             Response to Exercise   Blood Pressure (Admit) 118/70       Blood  Pressure (Exit) 120/70       Heart Rate (Admit) 76 bpm       Heart Rate (Exercise) 88 bpm       Heart Rate (Exit) 87 bpm       Oxygen  Saturation (Admit) 91 %       Oxygen  Saturation (Exercise) 89 %       Oxygen  Saturation (Exit) 91 %       Rating of Perceived Exertion (Exercise) 12       Perceived Dyspnea (Exercise) 1       Duration Continue with 30 min of aerobic exercise without signs/symptoms of physical distress.  Intensity THRR unchanged         Progression   Progression Continue to progress workloads to maintain intensity without signs/symptoms of physical distress.         Resistance Training   Weight 7       Reps 10-15         Oxygen    Oxygen  Continuous       Liters 3         Treadmill   MPH 1       Grade 0       Minutes 15       METs 1.99         NuStep   Level 3       SPM 105       Minutes 15       METs 2.9         Oxygen    Maintain Oxygen  Saturation 88% or higher          Exercise Comments:   Exercise Comments     Row Name 06/08/24 1037           Exercise Comments First full day of exercise!  Patient was oriented to gym and equipment including functions, settings, policies, and procedures.  Patient's individual exercise prescription and treatment plan were reviewed.  All starting workloads were established based on the results of the 6 minute walk test done at initial orientation visit.  The plan for exercise progression was also introduced and progression will be customized based on patient's performance and goals.          Exercise Goals and Review:   Exercise Goals Re-Evaluation :  Exercise Goals Re-Evaluation     Row Name 06/08/24 1037 07/01/24 0941 07/01/24 1155 08/03/24 0838 08/10/24 1101     Exercise Goal Re-Evaluation   Exercise Goals Review Able to understand and use rate of perceived exertion (RPE) scale;Knowledge and understanding of Target Heart Rate Range (THRR) Increase Physical Activity;Increase Strength and  Stamina;Understanding of Exercise Prescription Increase Physical Activity;Increase Strength and Stamina;Able to understand and use Dyspnea scale;Understanding of Exercise Prescription;Knowledge and understanding of Target Heart Rate Range (THRR);Able to understand and use rate of perceived exertion (RPE) scale Increase Physical Activity;Increase Strength and Stamina;Understanding of Exercise Prescription Increase Physical Activity;Increase Strength and Stamina;Able to understand and use Dyspnea scale;Knowledge and understanding of Target Heart Rate Range (THRR);Able to understand and use rate of perceived exertion (RPE) scale;Understanding of Exercise Prescription   Comments Reviewed RPE and dyspnea scale, THR and program prescription with pt today.  Pt voiced understanding and was given a copy of goals to take home. Ricardo Burch has completed 7 sessions of rehab. He has been doing well with the program . He has had to increase his oxygen  to 4L during exercise due to excerting hisself. Will continue to montior and progress as able Ricardo Burch is doing great in rehab. He notes he doesn't really exercise at home but he does do lots of things around the house. Spoke with him about maybe starting to walk his street, even with his dog, also did tell him to make sure the weather was ok when he did so his breathing would be ok. Ricardo Burch has completed 13 sessions of PR. He has be increasing his level on the nustep and has high spm. Is is still walking at a speed of 1.0 MPH on the treadmill but does not need to take as many breaks. Will continue to moniotr and progress as able. Ricardo Burch is doing  well in rehab. He is doing some walking at home. He is increasing his levels on the machines here at rehab when his oxygen  allows.   Expected Outcomes Short: Use RPE daily to regulate intensity.  Long: Follow program prescription in THR. Short: contiiue to increase levels and speed on treadmill   long: continue to attend rehab Short: continue to attend  rehab. Long: walk more at home. Short: continue to walk the treadmill  long: continue to atted rehab and exercise Short: Continue to attend rehab. Long: Increase exercise at home.    Row Name 08/18/24 1307 08/24/24 1044 09/21/24 1102 10/08/24 0841 11/18/24 1058     Exercise Goal Re-Evaluation   Exercise Goals Review Increase Physical Activity;Increase Strength and Stamina;Understanding of Exercise Prescription Increase Physical Activity;Increase Strength and Stamina;Able to understand and use rate of perceived exertion (RPE) scale Increase Physical Activity;Increase Strength and Stamina;Understanding of Exercise Prescription Increase Physical Activity;Increase Strength and Stamina;Understanding of Exercise Prescription Increase Physical Activity;Increase Strength and Stamina;Understanding of Exercise Prescription   Comments Ricardo Burch has completed 16 sessions of PR. HE is walking on the treadmiill in class and has had good oxygen  levels WNL. Will continue to monitor and progress as abe Ricardo Burch is doing well in rehab. He is trying to increase his levels on the treadmill and Nustep, yet he has to take breaks because his breathing will get tough and O2 sat's will drop. He does some walking at home, yet states he could incorpoate more. Ricardo Burch is doing well in rehab. He has noticed and increase in energy and feeling better since starting the program. He is walking at home and is finding an inside area to walk since the weater is getting colder Ricardo Burch has been doing great in rehab. He is on level 4 on the nustep and walking good on thr treadmill. Will continue to montior and progress as able Ricardo Burch is nearing graduation and planning to continue to walk at home.  He did improve on his walk test.  He is also thinking about enrolling in our maintenance program as he enjoys the interaction as well.  He has noticed that there are several things that he is now able to do that he couldn't do when he started the program.   Expected  Outcomes Short: continue to increase levels on the treadmill   long: conitnue to attend rehab Short: Continue to attend rehab. Long: Incorporate more exercise at home. Short: continue to attend rehab   long: contonue to exercise at home Short: continue to attend rehab   long: contonue to exercise at home Short: graduate Long; continue to exercise      Discharge Exercise Prescription (Final Exercise Prescription Changes):  Exercise Prescription Changes - 11/02/24 1500       Response to Exercise   Blood Pressure (Admit) 118/70    Blood Pressure (Exit) 120/70    Heart Rate (Admit) 76 bpm    Heart Rate (Exercise) 88 bpm    Heart Rate (Exit) 87 bpm    Oxygen  Saturation (Admit) 91 %    Oxygen  Saturation (Exercise) 89 %    Oxygen  Saturation (Exit) 91 %    Rating of Perceived Exertion (Exercise) 12    Perceived Dyspnea (Exercise) 1    Duration Continue with 30 min of aerobic exercise without signs/symptoms of physical distress.    Intensity THRR unchanged      Progression   Progression Continue to progress workloads to maintain intensity without signs/symptoms of physical distress.  Resistance Training   Weight 7    Reps 10-15      Oxygen    Oxygen  Continuous    Liters 3      Treadmill   MPH 1    Grade 0    Minutes 15    METs 1.99      NuStep   Level 3    SPM 105    Minutes 15    METs 2.9      Oxygen    Maintain Oxygen  Saturation 88% or higher          Nutrition:  Target Goals: Understanding of nutrition guidelines, daily intake of sodium 1500mg , cholesterol 200mg , calories 30% from fat and 7% or less from saturated fats, daily to have 5 or more servings of fruits and vegetables.  Education: Nutrition 1 -Group instruction provided by verbal, written material, interactive activities, discussions, models, and posters to present general guidelines for heart healthy nutrition including macronutrients, label reading, and promoting whole foods over processed  counterparts. Education serves as pensions consultant of discussion of heart healthy eating for all. Written material provided at class time. Flowsheet Row PULMONARY REHAB CHRONIC OBSTRUCTIVE PULMONARY DISEASE from 11/25/2024 in [facility]  Date 11/18/24  Educator [facility]  Instruction Review Code 1- Verbalizes Understanding      Education: Nutrition 2 -Group instruction provided by verbal, written material, interactive activities, discussions, models, and posters to present general guidelines for heart healthy nutrition including sodium, cholesterol, and saturated fat. Providing guidance of habit forming to improve blood pressure, cholesterol, and body weight. Written material provided at class time. Flowsheet Row PULMONARY REHAB CHRONIC OBSTRUCTIVE PULMONARY DISEASE from 11/25/2024 in [place] [facility]  Date 11/18/24  Educator [facility]  Instruction Review Code 1- Verbalizes Understanding      Biometrics:   Post Biometrics - 11/18/24 1122        Post  Biometrics   Height 5' 9 (1.753 m)    Weight 240 lb 8.4 oz (109.1 kg)    Waist Circumference 48 inches    Hip Circumference 42 inches    Waist to Hip Ratio 1.14 %    BMI (Calculated) 35.5    Grip Strength 14.8 kg          Nutrition Therapy Plan and Nutrition Goals:   Nutrition Assessments:  MEDIFICTS Score Key: >=70 Need to make dietary changes  40-70 Heart Healthy Diet <= 40 Therapeutic Level Cholesterol Diet  Flowsheet Row PULMONARY VIRTUAL BASED CARE from 05/11/2024 in [facility]  Picture Your Plate Total Score on Admission 49   Picture Your Plate Scores: <59 Unhealthy dietary pattern with much room for improvement. 41-50 Dietary pattern unlikely to meet recommendations for good health and room for improvement. 51-60 More healthful dietary pattern, with some room for improvement.  >60 Healthy dietary pattern, although there may be some specific behaviors that could be  improved.   Nutrition Goals Re-Evaluation:  Nutrition Goals Re-Evaluation     Row Name 07/01/24 1145 08/10/24 1057 08/24/24 1041 09/21/24 1105 11/18/24 1102     Goals   Nutrition Goal Short: start to incorporate more fiber in his diet. Long: eat 3 well-balanced meals per day. Healthy eating Healthy eating Healthy eating Short: start back with weightloss shot long : continue to pick healtier options   Comment Mike notes he is not eating the best at home, he does not eat much and not very well-balanced meals, he does like to eat fish and chicken and salads. I talked  with Manoj about continuing fish and chicken for protein, and maybe switching his salads with iceberg lettuce to more dark-green leafy. Eula also complains of constipation, so I spoke with him about his fiber intake, I mentioned metamucial supplement and foods that are high in fiber. Madsen notes he is not eating the best at home, he does not eat much and not very well-balanced meals, he does like to eat fish and chicken and salads. I talked with Jacqueline about continuing fish and chicken for protein, and maybe switching his salads with iceberg lettuce to more dark-green leafy. Izzac also complains of constipation, so I spoke with him about his fiber intake, I mentioned metamucial supplement and foods that are high in fiber. Seneca states that he is still struggling with his diet a little bit. He mainly eats 2 meals a day and something Ricardo Burch for lunch. He does state that he cheats a little on the weekends. Encouraged him to limit cheat days, yet still have them in moderation. He feels he is drinking enough water throughout the day. Murray continues to struggle a little with his diet. He is eating two full meals a day and a snack at night. He continues to have cheat days on the weekends. He has noticed that he has gained some weight and his doctor wants him to start back on ozempic to help lose weight. He will start this after the holidays. He does drink a  good amout of water during the day. He has not gotten back to ozempiv but did add sprionolactone.  he does want to resume shot once good with breathing. His diet is doing better overall but he still has room to improve and we talked today in nurtition class about grazing more than big meals which he wants to try.   Expected Outcome Short: start to incorporate more fiber in his diet. Long: eat 3 well-balanced meals per day. Short: Continue to attend rehab. Long: Incorporate more fiber into his diet. Short: Continue to attend rehab. Long: Incorporate more healthy options into his diet. Short: start back with weightloss shot   long : continue to pick healtier options Short; talk to doctor again about shot Long; continue to eat better      Nutrition Goals Discharge (Final Nutrition Goals Re-Evaluation):  Nutrition Goals Re-Evaluation - 11/18/24 1102       Goals   Nutrition Goal Short: start back with weightloss shot long : continue to pick healtier options    Comment He has not gotten back to ozempiv but did add sprionolactone.  he does want to resume shot once good with breathing. His diet is doing better overall but he still has room to improve and we talked today in nurtition class about grazing more than big meals which he wants to try.    Expected Outcome Short; talk to doctor again about shot Long; continue to eat better          Psychosocial: Target Goals: Acknowledge presence or absence of significant depression and/or stress, maximize coping skills, provide positive support system. Participant is able to verbalize types and ability to use techniques and skills needed for reducing stress and depression.   Education: Stress, Anxiety, and Depression - Group verbal and visual presentation to define topics covered.  Reviews how body is impacted by stress, anxiety, and depression.  Also discusses healthy ways to reduce stress and to treat/manage anxiety and depression.  Written material provided  at class time. Flowsheet Row PULMONARY REHAB CHRONIC OBSTRUCTIVE  PULMONARY DISEASE from 11/25/2024 in [place] [facility]  Date 11/25/24  Educator [facility]  Instruction Review Code 1- Verbalizes Understanding    Education: Sleep Hygiene -Provides group verbal and written instruction about how sleep can affect your health.  Define sleep hygiene, discuss sleep cycles and impact of sleep habits. Review good sleep hygiene tips.    Initial Review & Psychosocial Screening:   Quality of Life Scores:  Scores of 19 and below usually indicate a poorer quality of life in these areas.  A difference of  2-3 points is a clinically meaningful difference.  A difference of 2-3 points in the total score of the Quality of Life Index has been associated with significant improvement in overall quality of life, self-image, physical symptoms, and general health in studies assessing change in quality of life.  PHQ-9: Review Flowsheet  More data exists      07/23/2024 06/18/2024 05/17/2024 02/11/2024 01/20/2024  Depression screen PHQ 2/9  Decreased Interest 0 0 0 1 0 0  Down, Depressed, Hopeless 0 0 0 1 0 0  PHQ - 2 Score 0 0 0 2 0 0  Altered sleeping 0 0 0 1 0 0  Tired, decreased energy 0 0 1 2 0 0  Change in appetite 0 0 0 0 0 0  Feeling bad or failure about yourself  0 0 0 1 0 0  Trouble concentrating 0 0 0 0 0 0  Moving slowly or fidgety/restless 0 0 1 2 0 0  Suicidal thoughts 0 0 0 0 0 0  PHQ-9 Score 0  0  2  8  0  0   Difficult doing work/chores Not difficult at all Not difficult at all Not difficult at all Somewhat difficult Not difficult at all Not difficult at all    Details       Data saved with a previous flowsheet row definition   Multiple values from one day are sorted in reverse-chronological order        Interpretation of Total Score  Total Score Depression Severity:  1-4 = Minimal depression, 5-9 = Mild depression, 10-14 = Moderate depression, 15-19 = Moderately severe  depression, 20-27 = Severe depression   Psychosocial Evaluation and Intervention:   Psychosocial Re-Evaluation:  Psychosocial Re-Evaluation     Row Name 07/01/24 1141 08/10/24 1056 08/24/24 1040 09/21/24 1104 11/18/24 1100     Psychosocial Re-Evaluation   Current issues with None Identified Current Stress Concerns None Identified None Identified Current Stress Concerns   Comments Ricardo Burch is doing great in rehab. Ricardo Burch notes that he is staying busy at home, and going out and doing things with his wife. Ricardo Burch is enjoying life and coping well. Ricardo Burch is doing well in rehab. He is just now coming back from about a 2 week break. His breathing was a little rough from allergies, yet he sais he has had a lot going on as one of his twin daughters is engaged and had an engagement party. His stress is mainly happy stress regarding his daughters engagement. Mustapha is doing well in rehab. He currently identifies no stressors or sleep issues at the moment. Ricardo Burch is doing well in rehab. He currently identifies no stressors. He has noticed in the morning her feels bloated and was wondering if it was from the CPAP. He has lost weight and might need the CPAP adjusted Ricardo Burch is nearing graduation.  He is doing well overall and feels a lot better. He is excited that is able do more  now than when he started.  He does get frustrated and depressed feeling sometimes when he is not able to go and do as fast as he used to.  But he is still going and still trying.  He has enjoyed rehab and getting out and interacting with others in class. He is still using his CPAP and sleepign okay.  His other concern is his dropping satruations since restarting sprionolactone and he plans to talk to doctor about it.   Expected Outcomes Short: continue to attend rehab. Long: continue to stay active in activities that make him happy. Short: continue to attend rehab. Long: Enjoy the engagement of his daughter. Short: Continue to attend rehab. Long: Continue  to have healthy stress outlets. Short: continue pulm about CPAP    long: continue to have healthy stress outlet short; talk to doctor about sats Long: Continue to stay positive and exercise for mental boost   Interventions Encouraged to attend Pulmonary Rehabilitation for the exercise Encouraged to attend Pulmonary Rehabilitation for the exercise;Stress management education Encouraged to attend Pulmonary Rehabilitation for the exercise Encouraged to attend Pulmonary Rehabilitation for the exercise Encouraged to attend Pulmonary Rehabilitation for the exercise   Continue Psychosocial Services  Follow up required by staff Follow up required by staff Follow up required by staff Follow up required by staff Follow up required by staff      Psychosocial Discharge (Final Psychosocial Re-Evaluation):  Psychosocial Re-Evaluation - 11/18/24 1100       Psychosocial Re-Evaluation   Current issues with Current Stress Concerns    Comments Ricardo Burch is nearing graduation.  He is doing well overall and feels a lot better. He is excited that is able do more now than when he started.  He does get frustrated and depressed feeling sometimes when he is not able to go and do as fast as he used to.  But he is still going and still trying.  He has enjoyed rehab and getting out and interacting with others in class. He is still using his CPAP and sleepign okay.  His other concern is his dropping satruations since restarting sprionolactone and he plans to talk to doctor about it.    Expected Outcomes short; talk to doctor about sats Long: Continue to stay positive and exercise for mental boost    Interventions Encouraged to attend Pulmonary Rehabilitation for the exercise    Continue Psychosocial Services  Follow up required by staff          Education: Education Goals: Education classes will be provided on a weekly basis, covering required topics. Participant will state understanding/return demonstration of topics  presented.  Learning Barriers/Preferences:   General Pulmonary Education Topics:  Infection Prevention: - Provides verbal and written material to individual with discussion of infection control including proper hand washing and proper equipment cleaning during exercise session.   Falls Prevention: - Provides verbal and written material to individual with discussion of falls prevention and safety.   Chronic Lung Disease Review: - Group verbal instruction with posters, models, PowerPoint presentations and videos,  to review new updates, new respiratory medications, new advancements in procedures and treatments. Providing information on websites and 800 numbers for continued self-education. Includes information about supplement oxygen , available portable oxygen  systems, continuous and intermittent flow rates, oxygen  safety, concentrators, and Medicare reimbursement for oxygen . Explanation of Pulmonary Drugs, including class, frequency, complications, importance of spacers, rinsing mouth after steroid MDI's, and proper cleaning methods for nebulizers. Review of basic lung anatomy and physiology related to  function, structure, and complications of lung disease. Review of risk factors. Discussion about methods for diagnosing sleep apnea and types of masks and machines for OSA. Includes a review of the use of types of environmental controls: home humidity, furnaces, filters, dust mite/pet prevention, HEPA vacuums. Discussion about weather changes, air quality and the benefits of nasal washing. Instruction on Warning signs, infection symptoms, calling MD promptly, preventive modes, and value of vaccinations. Review of effective airway clearance, coughing and/or vibration techniques. Emphasizing that all should Create an Action Plan. Written material provided at class time. Flowsheet Row PULMONARY REHAB CHRONIC OBSTRUCTIVE PULMONARY DISEASE from 11/25/2024 in [place] [facility]  Date  08/12/24  Educator dj  Instruction Review Code 2- Demonstrated Understanding    AED/CPR: - Group verbal and written instruction with the use of models to demonstrate the basic use of the AED with the basic ABC's of resuscitation.    Tests and Procedures:  - Group verbal and visual presentation and models provide information about basic cardiac anatomy and function. Reviews the testing methods done to diagnose heart disease and the outcomes of the test results. Describes the treatment choices: Medical Management, Angioplasty, or Coronary Bypass Surgery for treating various heart conditions including Myocardial Infarction, Angina, Valve Disease, and Cardiac Arrhythmias.  Written material provided at class time. Flowsheet Row PULMONARY REHAB CHRONIC OBSTRUCTIVE PULMONARY DISEASE from 11/25/2024 in [place] [facility]  Date 08/19/24  Educator [facility]  Instruction Review Code 2- Demonstrated Understanding    Medication Safety: - Group verbal and visual instruction to review commonly prescribed medications for heart and lung disease. Reviews the medication, class of the drug, and side effects. Includes the steps to properly store meds and maintain the prescription regimen.  Written material given at graduation. Flowsheet Row PULMONARY REHAB CHRONIC OBSTRUCTIVE PULMONARY DISEASE from 11/25/2024 in [place] [facility]  Date 10/07/24  Educator [facility]  Instruction Review Code 1- Verbalizes Understanding    Other: -Provides group and verbal instruction on various topics (see comments)   Knowledge Questionnaire Score:    Core Components/Risk Factors/Patient Goals at Admission:   Education:Diabetes - Individual verbal and written instruction to review signs/symptoms of diabetes, desired ranges of glucose level fasting, after meals and with exercise. Acknowledge that pre and post exercise glucose checks will be done for 3 sessions at entry of program.   Know Your Numbers  and Heart Failure: - Group verbal and visual instruction to discuss disease risk factors for cardiac and pulmonary disease and treatment options.  Reviews associated critical values for Overweight/Obesity, Hypertension, Cholesterol, and Diabetes.  Discusses basics of heart failure: signs/symptoms and treatments.  Introduces Heart Failure Zone chart for action plan for heart failure. Written material provided at class time. Flowsheet Row PULMONARY REHAB CHRONIC OBSTRUCTIVE PULMONARY DISEASE from 11/25/2024 in [facility]  Instruction Review Code 1- Verbalizes Understanding    Core Components/Risk Factors/Patient Goals Review:   Goals and Risk Factor Review     Row Name 07/01/24 1151 08/10/24 1058 08/24/24 1043 09/21/24 1108 11/18/24 1104     Core Components/Risk Factors/Patient Goals Review   Personal Goals Review Improve shortness of breath with ADL's;Develop more efficient breathing techniques such as purse lipped breathing and diaphragmatic breathing and practicing self-pacing with activity.;Increase knowledge of respiratory medications and ability to use respiratory devices properly.;Weight Management/Obesity Improve shortness of breath with ADL's;Develop more efficient breathing techniques such as purse lipped breathing and diaphragmatic breathing and practicing self-pacing with activity.;Increase knowledge of respiratory medications and ability to use  respiratory devices properly.;Weight Management/Obesity Improve shortness of breath with ADL's;Develop more efficient breathing techniques such as purse lipped breathing and diaphragmatic breathing and practicing self-pacing with activity.;Increase knowledge of respiratory medications and ability to use respiratory devices properly.;Weight Management/Obesity Improve shortness of breath with ADL's;Develop more efficient breathing techniques such as purse lipped breathing and diaphragmatic breathing and practicing self-pacing with  activity.;Increase knowledge of respiratory medications and ability to use respiratory devices properly.;Weight Management/Obesity Improve shortness of breath with ADL's;Develop more efficient breathing techniques such as purse lipped breathing and diaphragmatic breathing and practicing self-pacing with activity.;Increase knowledge of respiratory medications and ability to use respiratory devices properly.;Weight Management/Obesity   Review Avondre is doing great in rehab. Kashaun has COPD and is on 3L/min via nasal cannula, continous, and wears a CPAP at bedtime; which are all working very well for him. Vail has noted some issues with constipation and more gas. We spoke about him following up with his GI doctor. Dartanyon is doing well in rehab. He just came back from a two week break from his breathing being rough with allergies. He states it is a lot easier to breathe in the cooler months. He is taking all his medications as prescribed and checks his BP and O2 at home. Jaquae is doing well in rehab. He feels his breathing is doing well since the cooler weather has came. He checks his oxygen  frequently and checks BP at home. He takes all his medications as prescribed. Slate is doing well in rehab. He feels his breathing is doing well since the cooler weather has came. He checks his oxygen  frequently and checks BP at home. He takes all his medications as prescribed. He has been feeling bloated in the morning and we talked about following up with Pulm and redoing sleep study. He lost weight and CPAP may need to be readjusted due to in swolling air and it getting trapped. Dak is nearing graduation and doing well.  His weight is steady. His breathing is better overall, but his saturations are dropping since restarting spironolactone.  He wants to talk to his doctor about this.  He is able to do more with less SOB and feels rehab has really made a difference.   Expected Outcomes Short: follow up with his GI doctor. Long: continue  to wear his oxygen  devices as prescribed. Short: Continue to attend rehab. Long: Continue to wear his oxygen  devices as prescribed. Short: Continue to attend rehab. Long: Continue to wear his oxygen  devices as prescribed. Short: follow up with Pulm Dr.   darra: cotninue with CPAP and taling medicaiton Continue to montior breathing closely      Core Components/Risk Factors/Patient Goals at Discharge (Final Review):   Goals and Risk Factor Review - 11/18/24 1104       Core Components/Risk Factors/Patient Goals Review   Personal Goals Review Improve shortness of breath with ADL's;Develop more efficient breathing techniques such as purse lipped breathing and diaphragmatic breathing and practicing self-pacing with activity.;Increase knowledge of respiratory medications and ability to use respiratory devices properly.;Weight Management/Obesity    Review Dakhari is nearing graduation and doing well.  His weight is steady. His breathing is better overall, but his saturations are dropping since restarting spironolactone.  He wants to talk to his doctor about this.  He is able to do more with less SOB and feels rehab has really made a difference.    Expected Outcomes Continue to montior breathing closely          ITP Comments:  ITP Comments     Row Name 06/08/24 1037 06/16/24 1150 07/14/24 1507 08/11/24 1527 09/08/24 0854   ITP Comments First full day of exercise!  Patient was oriented to gym and equipment including functions, settings, policies, and procedures.  Patient's individual exercise prescription and treatment plan were reviewed.  All starting workloads were established based on the results of the 6 minute walk test done at initial orientation visit.  The plan for exercise progression was also introduced and progression will be customized based on patient's performance and goals. 30 day review completed. ITP sent to Dr.Jehanzeb Memon, Medical Director of  Pulmonary Rehab. Continue with ITP unless  changes are made by physician.   Still new to program with start delayed to 06/08/24 and has only completed two exercise sessions thus far. 30 day review completed. ITP sent to Dr.Jehanzeb Memon, Medical Director of  Pulmonary Rehab. Continue with ITP unless changes are made by physician. 30 day review completed. ITP sent to Dr.Jehanzeb Memon, Medical Director of  Pulmonary Rehab. Continue with ITP unless changes are made by physician. 30 day review completed. ITP sent to Dr.Jehanzeb Memon, Medical Director of  Pulmonary Rehab. Continue with ITP unless changes are made by physician.    Row Name 10/05/24 1049 11/02/24 1438         ITP Comments 30 day review completed. ITP sent to Dr.Jehanzeb Memon, Medical Director of  Pulmonary Rehab. Continue with ITP unless changes are made by physician. 30 day review completed. ITP sent to Dr.Jehanzeb Memon, Medical Director of  Pulmonary Rehab. Continue with ITP unless changes are made by physician.         Comments: 30 day review.     [1]  Current Outpatient Medications:    albuterol  (VENTOLIN  HFA) 108 (90 Base) MCG/ACT inhaler, INHALE 1-2 PUFFS BY MOUTH EVERY 6 HOURS AS NEEDED FOR WHEEZING AND SHORTNESS OF BREATH, Disp: 8.5 g, Rfl: 11   amLODipine  (NORVASC ) 5 MG tablet, TAKE 1 TABLET BY MOUTH DAILY, Disp: 30 tablet, Rfl: 11   fluticasone  (FLONASE ) 50 MCG/ACT nasal spray, INSTILL TWO (2) SPRAYS IN EACH NOSTRIL TWICE DAILY, Disp: 16 g, Rfl: 11   Fluticasone -Umeclidin-Vilant (TRELEGY ELLIPTA ) 200-62.5-25 MCG/ACT AEPB, INHALE 1 PUFF BY MOUTH DAILY, Disp: 60 each, Rfl: 9   linaclotide  (LINZESS ) 72 MCG capsule, Take 1 capsule (72 mcg total) by mouth daily before breakfast., Disp: , Rfl:    OXYGEN , Inhale 3 L into the lungs daily. continuous, Disp: , Rfl:    OZEMPIC, 1 MG/DOSE, 4 MG/3ML SOPN, SMARTSIG:0.75 Milliliter(s) SUB-Q Once a Week, Disp: , Rfl:    pantoprazole  (PROTONIX ) 40 MG tablet, Take 1 tablet (40 mg total) by mouth daily. 30 minutes before breakfast  for reflux, Disp: 90 tablet, Rfl: 3   roflumilast  (DALIRESP ) 500 MCG TABS tablet, TAKE 1 TABLET BY MOUTH DAILY, Disp: 30 tablet, Rfl: 11   sildenafil  (VIAGRA ) 50 MG tablet, Take 1 tablet (50 mg total) by mouth daily as needed for erectile dysfunction., Disp: 30 tablet, Rfl: 1   spironolactone (ALDACTONE) 25 MG tablet, Take 25 mg by mouth daily., Disp: , Rfl:  [2]  Social History Tobacco Use  Smoking Status Former   Current packs/day: 0.00   Average packs/day: 1 pack/day for 38.0 years (38.0 ttl pk-yrs)   Types: Cigarettes   Start date: 02/20/1982   Quit date: 02/21/2020   Years since quitting: 4.7  Smokeless Tobacco Never

## 2024-12-02 ENCOUNTER — Encounter (HOSPITAL_COMMUNITY)
Admission: RE | Admit: 2024-12-02 | Discharge: 2024-12-02 | Disposition: A | Discharge status: Home / Self Care | Source: Ambulatory Visit | Attending: Pulmonary Disease | Admitting: Pulmonary Disease

## 2024-12-02 DIAGNOSIS — J4489 Other specified chronic obstructive pulmonary disease: Secondary | ICD-10-CM | POA: Diagnosis not present

## 2024-12-02 NOTE — Progress Notes (Signed)
 Daily Session Note  Patient Details  Name: Ricardo Burch MRN: [id number] Date of Birth: Feb 19, 1961 Referring Provider:   Flowsheet Row PULMONARY REHAB OTHER RESP ORIENTATION from 05/17/2024 in [facility]  Referring Provider Theophilus Roosevelt MD    Encounter Date: 12/02/2024  Check In:  Session Check In - 12/02/24 1032       Check-In   Supervising physician immediately available to respond to emergencies See telemetry face sheet for immediately available MD    Location [facility]    Staff Present Powell Benders, BS, Exercise Physiologist;Donie Moulton Dean BSN, RN    Virtual Visit No    Medication changes reported     No    Fall or balance concerns reported    No    Tobacco Cessation No Change    Warm-up and Cool-down Performed on first and last piece of equipment    Resistance Training Performed Yes    VAD Patient? No    PAD/SET Patient? No      Pain Assessment   Currently in Pain? No/denies    Pain Score 0-No pain    Multiple Pain Sites No          Capillary Blood Glucose: No results found for this or any previous visit (from the past 24 hours).    Tobacco Use History[1]  Goals Met:  Proper associated with RPD/PD & O2 Sat Independence with exercise equipment Using PLB without cueing & demonstrates good technique Exercise tolerated well Queuing for purse lip breathing No report of concerns or symptoms today Strength training completed today  Goals Unmet:  Not Applicable  Comments: SABRASABRAPt able to follow exercise prescription today without complaint.  Will continue to monitor for progression.        [1]  Social History Tobacco Use  Smoking Status Former   Current packs/day: 0.00   Average packs/day: 1 pack/day for 38.0 years (38.0 ttl pk-yrs)   Types: Cigarettes   Start date: 02/20/1982   Quit date: 02/21/2020   Years since quitting: 4.7  Smokeless Tobacco Never

## 2024-12-07 ENCOUNTER — Encounter (HOSPITAL_COMMUNITY)

## 2024-12-07 ENCOUNTER — Ambulatory Visit
Admission: EM | Admit: 2024-12-07 | Discharge: 2024-12-07 | Disposition: A | Discharge status: Home / Self Care | Source: Home / Self Care

## 2024-12-07 ENCOUNTER — Telehealth (HOSPITAL_COMMUNITY): Payer: Self-pay

## 2024-12-07 ENCOUNTER — Ambulatory Visit (INDEPENDENT_AMBULATORY_CARE_PROVIDER_SITE_OTHER)

## 2024-12-07 DIAGNOSIS — W19XXXA Unspecified fall, initial encounter: Secondary | ICD-10-CM | POA: Diagnosis not present

## 2024-12-07 DIAGNOSIS — M79641 Pain in right hand: Secondary | ICD-10-CM

## 2024-12-07 NOTE — Discharge Instructions (Signed)
 Your x-ray was negative for any broken bones in the hand.  You may use the Ace wrap for compression as needed, rest, ice, elevation, Tylenol  as needed for pain.  Follow-up for worsening or unresolving symptoms

## 2024-12-09 ENCOUNTER — Encounter (HOSPITAL_COMMUNITY): Attending: Pulmonary Disease

## 2024-12-14 ENCOUNTER — Encounter (HOSPITAL_COMMUNITY): Attending: Pulmonary Disease

## 2024-12-17 ENCOUNTER — Ambulatory Visit: Admitting: Internal Medicine

## 2025-01-06 ENCOUNTER — Ambulatory Visit: Admitting: Cardiovascular Disease

## 2025-01-10 ENCOUNTER — Ambulatory Visit
